# Patient Record
Sex: Female | Born: 1955 | Race: White | Hispanic: No | Marital: Married | Smoking: Never smoker
Health system: Southern US, Community
[De-identification: ages and names within clinical notes are randomized; demographics above are authoritative.]

## PROBLEM LIST (undated history)

## (undated) DIAGNOSIS — M869 Osteomyelitis, unspecified: Secondary | ICD-10-CM

## (undated) DIAGNOSIS — M549 Dorsalgia, unspecified: Secondary | ICD-10-CM

## (undated) DIAGNOSIS — M542 Cervicalgia: Secondary | ICD-10-CM

## (undated) DIAGNOSIS — Z7689 Persons encountering health services in other specified circumstances: Secondary | ICD-10-CM

## (undated) DIAGNOSIS — N200 Calculus of kidney: Secondary | ICD-10-CM

## (undated) DIAGNOSIS — M4646 Discitis, unspecified, lumbar region: Secondary | ICD-10-CM

## (undated) DIAGNOSIS — K219 Gastro-esophageal reflux disease without esophagitis: Secondary | ICD-10-CM

## (undated) DIAGNOSIS — K759 Inflammatory liver disease, unspecified: Secondary | ICD-10-CM

## (undated) DIAGNOSIS — F419 Anxiety disorder, unspecified: Secondary | ICD-10-CM

## (undated) DIAGNOSIS — J45990 Exercise induced bronchospasm: Secondary | ICD-10-CM

## (undated) DIAGNOSIS — I10 Essential (primary) hypertension: Secondary | ICD-10-CM

## (undated) HISTORY — PX: PICC LINE PLACE PERIPHERAL (ARMC HX): HXRAD1248

## (undated) HISTORY — DX: Cervicalgia: M54.2

## (undated) HISTORY — DX: Anxiety disorder, unspecified: F41.9

---
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
## 1997-05-26 HISTORY — PX: TUBAL LIGATION: SHX77

## 1999-02-17 ENCOUNTER — Encounter: Payer: Self-pay | Admitting: Emergency Medicine

## 1999-02-17 ENCOUNTER — Emergency Department (HOSPITAL_COMMUNITY): Admission: EM | Admit: 1999-02-17 | Discharge: 1999-02-17 | Payer: Self-pay | Admitting: Emergency Medicine

## 2001-02-02 ENCOUNTER — Other Ambulatory Visit: Admission: RE | Admit: 2001-02-02 | Discharge: 2001-02-02 | Payer: Self-pay | Admitting: Obstetrics & Gynecology

## 2002-03-17 ENCOUNTER — Other Ambulatory Visit: Admission: RE | Admit: 2002-03-17 | Discharge: 2002-03-17 | Payer: Self-pay | Admitting: Obstetrics & Gynecology

## 2002-03-25 ENCOUNTER — Encounter: Payer: Self-pay | Admitting: Obstetrics & Gynecology

## 2002-03-25 ENCOUNTER — Encounter: Admission: RE | Admit: 2002-03-25 | Discharge: 2002-03-25 | Payer: Self-pay | Admitting: Obstetrics & Gynecology

## 2003-04-04 ENCOUNTER — Other Ambulatory Visit: Admission: RE | Admit: 2003-04-04 | Discharge: 2003-04-04 | Payer: Self-pay | Admitting: Obstetrics & Gynecology

## 2003-04-14 ENCOUNTER — Ambulatory Visit (HOSPITAL_COMMUNITY): Admission: RE | Admit: 2003-04-14 | Discharge: 2003-04-14 | Payer: Self-pay | Admitting: Internal Medicine

## 2003-05-08 ENCOUNTER — Encounter: Admission: RE | Admit: 2003-05-08 | Discharge: 2003-05-08 | Payer: Self-pay | Admitting: Obstetrics & Gynecology

## 2004-04-26 ENCOUNTER — Encounter: Admission: RE | Admit: 2004-04-26 | Discharge: 2004-04-26 | Payer: Self-pay | Admitting: Obstetrics & Gynecology

## 2004-04-26 ENCOUNTER — Other Ambulatory Visit: Admission: RE | Admit: 2004-04-26 | Discharge: 2004-04-26 | Payer: Self-pay | Admitting: Obstetrics & Gynecology

## 2005-05-29 ENCOUNTER — Encounter: Admission: RE | Admit: 2005-05-29 | Discharge: 2005-05-29 | Payer: Self-pay | Admitting: Obstetrics & Gynecology

## 2005-08-06 ENCOUNTER — Other Ambulatory Visit: Admission: RE | Admit: 2005-08-06 | Discharge: 2005-08-06 | Payer: Self-pay | Admitting: Obstetrics & Gynecology

## 2006-06-26 ENCOUNTER — Encounter: Admission: RE | Admit: 2006-06-26 | Discharge: 2006-06-26 | Payer: Self-pay | Admitting: Obstetrics & Gynecology

## 2006-09-04 ENCOUNTER — Encounter: Admission: RE | Admit: 2006-09-04 | Discharge: 2006-09-04 | Payer: Self-pay | Admitting: Obstetrics & Gynecology

## 2007-04-14 ENCOUNTER — Ambulatory Visit (HOSPITAL_COMMUNITY): Admission: RE | Admit: 2007-04-14 | Discharge: 2007-04-14 | Payer: Self-pay | Admitting: Internal Medicine

## 2007-05-17 ENCOUNTER — Encounter: Admission: RE | Admit: 2007-05-17 | Discharge: 2007-05-17 | Payer: Self-pay | Admitting: General Surgery

## 2008-05-26 HISTORY — PX: BREAST CYST EXCISION: SHX579

## 2008-07-13 ENCOUNTER — Encounter: Admission: RE | Admit: 2008-07-13 | Discharge: 2008-07-13 | Payer: Self-pay | Admitting: Surgery

## 2008-07-18 ENCOUNTER — Ambulatory Visit (HOSPITAL_COMMUNITY): Admission: RE | Admit: 2008-07-18 | Discharge: 2008-07-18 | Payer: Self-pay | Admitting: Surgery

## 2008-07-18 ENCOUNTER — Encounter (INDEPENDENT_AMBULATORY_CARE_PROVIDER_SITE_OTHER): Payer: Self-pay | Admitting: Surgery

## 2009-10-18 ENCOUNTER — Encounter: Admission: RE | Admit: 2009-10-18 | Discharge: 2009-10-18 | Payer: Self-pay | Admitting: Obstetrics & Gynecology

## 2010-09-10 LAB — BASIC METABOLIC PANEL
BUN: 7 mg/dL (ref 6–23)
CO2: 27 mEq/L (ref 19–32)
Calcium: 9.2 mg/dL (ref 8.4–10.5)
Chloride: 100 mEq/L (ref 96–112)
Creatinine, Ser: 0.69 mg/dL (ref 0.4–1.2)
GFR calc Af Amer: >60 mL/min (ref >60)
GFR calc non Af Amer: >60 mL/min (ref >60)
Glucose, Bld: 100 mg/dL — ABNORMAL HIGH (ref 70–99)
Potassium: 3.5 mEq/L (ref 3.5–5.1)
Sodium: 136 mEq/L (ref 135–145)

## 2010-09-10 LAB — HEMOGLOBIN AND HEMATOCRIT, BLOOD
HCT: 37.2 % (ref 36.0–46.0)
Hemoglobin: 12.3 g/dL (ref 12.0–15.0)

## 2010-10-08 NOTE — Op Note (Signed)
NAME:  Chloe Johnson, Chloe Johnson                  ACCOUNT NO.:  [id number]   MEDICAL RECORD NO.:  [id number]          PATIENT TYPE:  AMB   LOCATION:  DAY                          FACILITY:  [facility]   PHYSICIAN:  Currie Paris, M.D.DATE OF BIRTH:  09/26/1955   DATE OF PROCEDURE:  07/18/2008  DATE OF DISCHARGE:                               OPERATIVE REPORT   OFFICE MEDICAL RECORD NUMBER [id number]   PREOPERATIVE DIAGNOSES:  Spontaneous nipple discharge, left breast.   POSTOPERATIVE DIAGNOSES:  Spontaneous nipple discharge, left breast.   OPERATION:  Ductal excision, left breast.   SURGEON:  Dr. Cyndia Bent   ANESTHESIA:  General.   CLINICAL HISTORY:  This is a 55 year old lady with a spontaneous clear  nipple discharge from a duct at about the 5 o'clock position on the left  breast.  A ductogram had shown a filling defect just under the skin.  Because of the persistent nature we elected to do a ductal excision.   DESCRIPTION OF PROCEDURE:  The patient was seen in the holding area and  she had no further questions.  We identified the left breast as the  operative side and I initialed that.   The patient was taken to the operating room.  After satisfactory general  anesthesia had been obtained, the breast was prepped and draped.  The  time-out was done.  I tried to cannulate the duct first with a 2-0 tear-  duct probe and was able to get the duct dilated a little bit.  I then  tried to inject some methylene blue, but the catheter only went a little  bit in and we did inject a little dye, but also got some staining of the  subcutaneous tissues.   I then made a curvilinear incision at the edge of the areola and  elevated the skin off of the undersurface of the areola and beyond the  nipple so that we had that tissue disconnected.  I could see the blue  tissue staining there.  I then took a central ductal excision, going  about a centimeter and a half deep and into some very dense breast  tissue.  It was very vascular and I cauterized several bleeders in doing  so.   I sent the specimen for pathology.  I irrigated.  I put 0.25% plain  Marcaine in to help with postop analgesia.  Once everything was dry and  I had irrigated several times, I went ahead closed with 3-0 Vicryl, 4-0  Monocryl subcuticular plus Dermabond.   The patient tolerated the procedure well and there were no  complications.  All counts were correct.      Currie Paris, M.D.  Electronically Signed     CJS/MEDQ  D:  07/18/2008  T:  07/19/2008  Job:  [id number]   cc:   Freddy Finner, M.D.  Fax: [phone]

## 2010-11-29 ENCOUNTER — Other Ambulatory Visit: Payer: Self-pay | Admitting: Obstetrics & Gynecology

## 2010-11-29 DIAGNOSIS — Z1231 Encounter for screening mammogram for malignant neoplasm of breast: Secondary | ICD-10-CM

## 2010-12-05 ENCOUNTER — Ambulatory Visit
Admission: RE | Admit: 2010-12-05 | Discharge: 2010-12-05 | Disposition: A | Discharge status: Home / Self Care | Payer: 59 | Source: Ambulatory Visit | Attending: Obstetrics & Gynecology | Admitting: Obstetrics & Gynecology

## 2010-12-05 DIAGNOSIS — Z1231 Encounter for screening mammogram for malignant neoplasm of breast: Secondary | ICD-10-CM

## 2012-05-13 ENCOUNTER — Other Ambulatory Visit: Payer: Self-pay | Admitting: Obstetrics & Gynecology

## 2012-05-13 DIAGNOSIS — Z1231 Encounter for screening mammogram for malignant neoplasm of breast: Secondary | ICD-10-CM

## 2012-05-21 ENCOUNTER — Other Ambulatory Visit: Payer: Self-pay | Admitting: Internal Medicine

## 2012-05-21 DIAGNOSIS — G8929 Other chronic pain: Secondary | ICD-10-CM

## 2012-05-21 DIAGNOSIS — M5410 Radiculopathy, site unspecified: Secondary | ICD-10-CM

## 2012-05-27 ENCOUNTER — Ambulatory Visit
Admission: RE | Admit: 2012-05-27 | Discharge: 2012-05-27 | Disposition: A | Discharge status: Home / Self Care | Payer: BC Managed Care – PPO | Source: Ambulatory Visit | Attending: Internal Medicine | Admitting: Internal Medicine

## 2012-05-27 DIAGNOSIS — M5410 Radiculopathy, site unspecified: Secondary | ICD-10-CM

## 2012-05-27 DIAGNOSIS — G8929 Other chronic pain: Secondary | ICD-10-CM

## 2012-05-27 DIAGNOSIS — M542 Cervicalgia: Secondary | ICD-10-CM

## 2012-06-02 ENCOUNTER — Ambulatory Visit
Admission: RE | Admit: 2012-06-02 | Discharge: 2012-06-02 | Disposition: A | Discharge status: Home / Self Care | Payer: BC Managed Care – PPO | Source: Ambulatory Visit | Attending: Obstetrics & Gynecology | Admitting: Obstetrics & Gynecology

## 2012-06-02 DIAGNOSIS — Z1231 Encounter for screening mammogram for malignant neoplasm of breast: Secondary | ICD-10-CM

## 2013-06-15 ENCOUNTER — Other Ambulatory Visit: Payer: Self-pay

## 2013-06-15 DIAGNOSIS — Z1231 Encounter for screening mammogram for malignant neoplasm of breast: Secondary | ICD-10-CM

## 2013-06-23 ENCOUNTER — Ambulatory Visit
Admission: RE | Admit: 2013-06-23 | Discharge: 2013-06-23 | Disposition: A | Discharge status: Home / Self Care | Payer: Self-pay | Source: Ambulatory Visit

## 2013-06-23 DIAGNOSIS — Z1231 Encounter for screening mammogram for malignant neoplasm of breast: Secondary | ICD-10-CM

## 2013-09-11 ENCOUNTER — Emergency Department (HOSPITAL_COMMUNITY)
Admission: EM | Admit: 2013-09-11 | Discharge: 2013-09-11 | Disposition: A | Discharge status: Home / Self Care | Payer: Managed Care, Other (non HMO) | Source: Home / Self Care | Attending: Emergency Medicine | Admitting: Emergency Medicine

## 2013-09-11 ENCOUNTER — Encounter (HOSPITAL_COMMUNITY): Payer: Self-pay | Admitting: Emergency Medicine

## 2013-09-11 DIAGNOSIS — M5430 Sciatica, unspecified side: Secondary | ICD-10-CM

## 2013-09-11 HISTORY — DX: Essential (primary) hypertension: I10

## 2013-09-11 MED ORDER — METHYLPREDNISOLONE ACETATE 80 MG/ML IJ SUSP
INTRAMUSCULAR | Status: AC
  Filled 2013-09-11: qty 1

## 2013-09-11 MED ORDER — CYCLOBENZAPRINE HCL 5 MG PO TABS: 5.0000 mg | ORAL_TABLET | Freq: Three times a day (TID) | ORAL | Status: DC | PRN

## 2013-09-11 MED ORDER — PREDNISONE 20 MG PO TABS: ORAL_TABLET | ORAL | Status: DC

## 2013-09-11 MED ORDER — METHYLPREDNISOLONE ACETATE 80 MG/ML IJ SUSP
80.0000 mg | Freq: Once | INTRAMUSCULAR | Status: AC
  Administered 2013-09-11: 80 mg via INTRAMUSCULAR

## 2013-09-11 MED ORDER — KETOROLAC TROMETHAMINE 60 MG/2ML IM SOLN
60.0000 mg | Freq: Once | INTRAMUSCULAR | Status: AC
  Administered 2013-09-11: 60 mg via INTRAMUSCULAR

## 2013-09-11 MED ORDER — KETOROLAC TROMETHAMINE 60 MG/2ML IM SOLN
INTRAMUSCULAR | Status: AC
  Filled 2013-09-11: qty 2

## 2013-09-11 MED ORDER — OXYCODONE-ACETAMINOPHEN 5-325 MG PO TABS: ORAL_TABLET | ORAL | Status: DC

## 2013-09-11 NOTE — ED Notes (Signed)
Pain in lower back , mild stool incontinence , slight numbness in leg, since last PM. [employer] last PM

## 2013-09-11 NOTE — ED Provider Notes (Signed)
Chief Complaint   Chief Complaint  Patient presents with  . Back Pain    History of Present Illness   Zhana Bogle is a 58 year old female with a history of chronic back and neck pain. She had a flareup of the lower back pain last night at 7 PM. She denies any injury to the back. The pain is localized to the lower back and is rated 10 over 10 in intensity with muscle spasm. It's worse if she moves, and radiates down the left leg as far as the foot with numbness and weakness in the leg. She had one episode of incontinence of stool. She denies any episodes of incontinence of urine or saddle anesthesia. The patient states that she has had episodes of stool incontinence in the past. She does not want to go to the hospital tonight, and declines any further testing. She wants a Toradol shot and some prednisone.  Review of Systems   Other than as noted above, the patient denies any of the following symptoms: Systemic:  No fever, chills, or unexplained weight loss. GI:  No abdominal painor incontinence of bowel. GU:  No dysuria, frequency, urgency, or hematuria. No incontinence of urine or urinary retention.  M-S:  No neck pain or arthritis. Neuro:  No paresthesias, headache, saddle anesthesia, muscular weakness, or progressive neurological deficit.  PMFSH   Past medical history, family history, social history, meds, and allergies were reviewed. Specifically, there is no history of cancer, major trauma, osteoporosis, immunosuppression, or HIV infection.   Physical Examination    Vital signs:  BP 107/62  Pulse 72  Temp(Src) 98 F (36.7 C) (Oral)  Resp 16  SpO2 99% General:  Alert, oriented, in no distress. Abdomen:  Soft, non-tender.  No organomegaly or mass.  No pulsatile midline abdominal mass or bruit. Back:  Tender to palpation in the lower lumbar spine was virtually 0 of motion. Straight leg raising was negative. Neuro:  Normal muscle strength, sensations and DTRs. Extremities: Pedal  pulses were full, there was no edema. Skin:  Clear, warm and dry.  No rash.   Course in Urgent Care Center   Given Toradol 60 mg IM and Depo-Medrol 80 mg IM.    Assessment   The encounter diagnosis was Sciatica.  With the incontinence of stool, cauda equina syndrome is a possibility. I discussed this with her and told her that we recommend people with this symptom be sent to the hospital for either a CT or MRI scan. She steadfastly declines this course of treatment. She promises to followup with her primary care physician tomorrow. She will call back or return to the emergency room if any new neurological symptoms occur. She states she has had transient episodes of incontinence of stool in the past with this.  Plan     1.  Meds:  The following meds were prescribed:   Discharge Medication List as of 09/11/2013  4:35 PM    START taking these medications   Details  !! cyclobenzaprine (FLEXERIL) 5 MG tablet Take 1 tablet (5 mg total) by mouth 3 (three) times daily as needed for muscle spasms., Starting 09/11/2013, Until Discontinued, Normal    oxyCODONE-acetaminophen (PERCOCET) 5-325 MG per tablet 1 to 2 tablets every 6 hours as needed for pain., Print    predniSONE (DELTASONE) 20 MG tablet Take 3 daily for 5 days, 2 daily for 5 days, 1 daily for 5 days., Normal     !! - Potential duplicate medications found. Please discuss with  provider.      2.  Patient Education/Counseling:  The patient was given appropriate handouts, self care instructions, and instructed in symptomatic relief. The patient was encouraged to try to be as active as possible and given some exercises to do followed by moist heat.   3.  Follow up:  The patient was told to follow up here if no better in 3 to 4 days, or sooner if becoming worse in any way, and given some red flag symptoms such as worsening pain or new neurological symptoms which would prompt immediate return.  Follow up with her primary care physician  tomorrow.     Reuben Likesavid C Bauer Ausborn, MD 09/11/13 2116

## 2013-09-11 NOTE — Discharge Instructions (Signed)
Do exercises twice daily followed by moist heat for 15 minutes. ° ° ° ° ° °Try to be as active as possible. ° °If no better in 2 weeks, follow up with orthopedist. ° ° °

## 2013-09-12 ENCOUNTER — Other Ambulatory Visit: Payer: Self-pay | Admitting: Internal Medicine

## 2013-09-12 DIAGNOSIS — M5430 Sciatica, unspecified side: Secondary | ICD-10-CM

## 2013-09-15 ENCOUNTER — Ambulatory Visit
Admission: RE | Admit: 2013-09-15 | Discharge: 2013-09-15 | Disposition: A | Discharge status: Home / Self Care | Payer: Managed Care, Other (non HMO) | Source: Ambulatory Visit | Attending: Internal Medicine | Admitting: Internal Medicine

## 2013-09-15 DIAGNOSIS — M5430 Sciatica, unspecified side: Secondary | ICD-10-CM

## 2013-10-13 ENCOUNTER — Other Ambulatory Visit: Payer: Self-pay | Admitting: Physical Medicine and Rehabilitation

## 2013-10-13 DIAGNOSIS — M546 Pain in thoracic spine: Secondary | ICD-10-CM

## 2013-10-20 ENCOUNTER — Ambulatory Visit
Admission: RE | Admit: 2013-10-20 | Discharge: 2013-10-20 | Disposition: A | Discharge status: Home / Self Care | Payer: Managed Care, Other (non HMO) | Source: Ambulatory Visit | Attending: Physical Medicine and Rehabilitation | Admitting: Physical Medicine and Rehabilitation

## 2013-10-20 DIAGNOSIS — M546 Pain in thoracic spine: Secondary | ICD-10-CM

## 2013-10-26 ENCOUNTER — Encounter: Payer: Self-pay | Admitting: Neurology

## 2013-10-26 ENCOUNTER — Ambulatory Visit (INDEPENDENT_AMBULATORY_CARE_PROVIDER_SITE_OTHER): Payer: Managed Care, Other (non HMO) | Admitting: Neurology

## 2013-10-26 VITALS — BP 119/82 | HR 88 | Ht 61.0 in | Wt 165.0 lb

## 2013-10-26 DIAGNOSIS — F411 Generalized anxiety disorder: Secondary | ICD-10-CM

## 2013-10-26 DIAGNOSIS — I10 Essential (primary) hypertension: Secondary | ICD-10-CM

## 2013-10-26 DIAGNOSIS — J45909 Unspecified asthma, uncomplicated: Secondary | ICD-10-CM | POA: Insufficient documentation

## 2013-10-26 DIAGNOSIS — F419 Anxiety disorder, unspecified: Secondary | ICD-10-CM

## 2013-10-26 NOTE — Progress Notes (Signed)
PATIENT: Chloe Johnson DOB: 04/21/1956  HISTORICAL  Meklit Cotta is a 58 year old right-handed Caucasian female, referred by pain management Dr. Mina Marble, and a primary care physician Dr. Lavone Orn for evaluation of low back pain, muscle spasm.  April nineteenth, she woke up in the middle of the sleep, felt severe trunk muscle spasm, lasting for a few hours, taking away her breath, this is about a week after she played tennis, she reported being exercise regularly, but has not played tennis for a while,  Since the initial event in April 19, she has intermittent trunk muscle spasm, from upper back, and lumbar region, it can last few seconds, to a few hours, triggered by certain movement,  She denies bilateral upper lower extremity weakness, no sensory loss in between episode, now bowel and bladder incontinence,  Over the past 2 weeks, she has been on titrating dose of gabapentin 600 mg 4 times a day, Flexeril 5 mg 2 tablets 3 times a day, without helping her symptoms, on a recent couple days, she began to taking diclofenac, which has been very helpful, she also has tried tramadol, intermittent oxycodone, only temporary relief her symptoms,   She received bilateral L5-S1 lumbar spine transforaminal epidural steroid injection May 20 first 2015, and left L3-4, L5-S1 lumbar spinal transforaminal epidural steroid injection Sep 29 2013 without helping her symptoms,  We have reviewed cervical MRI at [facility] imaging, which showed showed mild canal stenosis, no cord compression, disc herniation at C5-6, C6-7 level, previously she had cervical epidural injection, which did help her pain,  MRI of the Lumbar at [facility] radiology September 15 2013, At L2-3 there is a mild broad-based disc bulge with flattening of the ventral thecal sac. Moderate left facet arthropathy with ligamentum flavum infolding resulting in left lateral recess stenosis.  At L3-4 there is a moderate broad-based disc bulge with a left far  lateral disc component in close proximity to the left extra foraminal L3 nerve root. Moderate left facet arthropathy. Severe left and moderate right lateral recess stenosis. Mild spinal stenosis. Mild left foraminal stenosis.  At L4-5 there is a moderate broad-based disc osteophyte complex with flattening of the ventral thecal sac. Bilateral mild facet arthropathy. Moderate lateral recess stenosis. Moderate right  foraminal stenosis. Advanced degenerative cervical spondylosis with multilevel disc disease and facet disease. Disc osteophyte complexes at multiple  levels with spinal and foraminal stenosis as discussed above. Findings are progressive when compared with the prior examination from 2008.   MRI of thoracic spine showed mild degenerative disc disease but no significant canal stenosis.  Today, she feels better, denied gait difficulty, denies significant muscle spasm  REVIEW OF SYSTEMS: Full 14 system review of systems performed and notable only for weight gain, easy bruising, easy bleeding, feeling hot, flushing, joint pain, cramps, achy muscles, skin sensitivity, anxiety, not enough sleep  ALLERGIES: Allergies  Allergen Reactions  . Erythromycin     N+V  . Penicillins     Hives  . Shellfish Allergy     GI- Facial  Acne  . Strawberry     Mouth ulcers   . Sulfa Antibiotics     Hives  . Vicodin [Hydrocodone-Acetaminophen] Other (See Comments)    restless    HOME MEDICATIONS: Current Outpatient Prescriptions on File Prior to Visit  Medication Sig Dispense Refill  . cyclobenzaprine (FLEXERIL) 10 MG tablet Take 10 mg by mouth 3 (three) times daily as needed for muscle spasms.      Marland Kitchen esomeprazole (NEXIUM) 40  MG capsule Take 40 mg by mouth daily at 12 noon.      . gabapentin (NEURONTIN) 600 MG tablet Take 600 mg by mouth 3 (three) times daily.      Marland Kitchen lisinopril-hydrochlorothiazide (PRINZIDE,ZESTORETIC) 10-12.5 MG per tablet Take 1 tablet by mouth daily.      Marland Kitchen  oxyCODONE-acetaminophen (PERCOCET) 5-325 MG per tablet 1 to 2 tablets every 6 hours as needed for pain.  20 tablet  0  . potassium chloride SA (K-DUR,KLOR-CON) 20 MEQ tablet Take 20 mEq by mouth 2 (two) times daily.      . predniSONE (DELTASONE) 20 MG tablet Take 3 daily for 5 days, 2 daily for 5 days, 1 daily for 5 days.  30 tablet  0     PAST MEDICAL HISTORY: Past Medical History  Diagnosis Date  . Hypertension   . Asthma   . Neck pain   . Anxiety     PAST SURGICAL HISTORY: Past Surgical History  Procedure Laterality Date  . Tonsillectomy    . Tubal ligation      FAMILY HISTORY: No family history on file.  SOCIAL HISTORY:  History   Social History  . Marital Status: Married    Spouse Name: N/A    Number of Children: 3  . Years of Education: N/A   Occupational History  . 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   Social History Main Topics  . Smoking status: Never Smoker   . Smokeless tobacco: Not on file  . Alcohol Use: No  . Drug Use: Not on file  . Sexual Activity: Not on file   Other Topics Concern  . Not on file   Social History Narrative  . No narrative on file    PHYSICAL EXAM   Filed Vitals:   10/26/13 1126  BP: 119/82  Pulse: 88  Height: $Remove'5\' 1"'jlZDxUL$  (1.549 m)  Weight: 165 lb (74.844 kg)    Not recorded    Body mass index is 31.19 kg/(m^2).   Generalized: In no acute distress  Neck: Supple, no carotid bruits   Cardiac: Regular rate rhythm  Pulmonary: Clear to auscultation bilaterally  Musculoskeletal: No deformity, paraspinal muscle tightness  Neurological examination  Mentation: Alert oriented to time, place, history taking, and causual conversation  Cranial nerve II-XII: Pupils were equal round reactive to light. Extraocular movements were full.  Visual field were full on confrontational test. Bilateral fundi were sharp.  Facial sensation and strength were normal. Hearing was intact to finger rubbing bilaterally. Uvula  tongue midline.  Head turning and shoulder shrug and were normal and symmetric.Tongue protrusion into cheek strength was normal.  Motor: Normal tone, bulk and strength.  Sensory: Intact to fine touch, pinprick, preserved vibratory sensation, and proprioception at toes.  Coordination: Normal finger to nose, heel-to-shin bilaterally there was no truncal ataxia  Gait: Rising up from seated position without assistance, normal stance, without trunk ataxia, moderate stride, good arm swing, smooth turning, able to perform tiptoe, and heel walking without difficulty.   Romberg signs: Negative  Deep tendon reflexes: Brachioradialis 2/2, biceps 2/2, triceps 2/2, patellar 2/2, Achilles 2/2, plantar responses were flexor bilaterally.   DIAGNOSTIC DATA (LABS, IMAGING, TESTING) - I reviewed patient records, labs, notes, testing and imaging myself where available.  Lab Results  Component Value Date   HGB 12.3 07/18/2008   HCT 37.2 07/18/2008      Component Value Date/Time   NA 136 07/18/2008 0830   K 3.5 07/18/2008 0830   CL 100  07/18/2008 0830   CO2 27 07/18/2008 0830   GLUCOSE 100* 07/18/2008 0830   BUN 7 07/18/2008 0830   CREATININE 0.69 07/18/2008 0830   CALCIUM 9.2 07/18/2008 0830   GFRNONAA >60 07/18/2008 0830   GFRAA  Value: >60        The eGFR has been calculated using the MDRD equation. This calculation has not been validated in all clinical situations. eGFR's persistently <60 mL/min signify possible Chronic Kidney Disease. 07/18/2008 0830    ASSESSMENT AND PLAN  Lorriane Dehart is a 58 y.o. female complains of  upper trunk muscle spasm, essentially normal neurological examination, responding well to diclofenac 75 mg twice a day Likely musculoskeletal in etiology, there was no evidence of cervical, lumbar radiculopathy, no evidence of myelopathy,  She is to continue taking diclofenac, return to clinic as needed,   Marcial Pacas, M.D. Ph.D.  [facility] [address], [place], [place] [address] [phone]

## 2013-11-16 ENCOUNTER — Inpatient Hospital Stay (HOSPITAL_COMMUNITY)
Admission: EM | Admit: 2013-11-16 | Discharge: 2013-11-18 | DRG: 520 | Disposition: A | Discharge status: Home / Self Care | Payer: Managed Care, Other (non HMO) | Attending: Internal Medicine | Admitting: Internal Medicine

## 2013-11-16 ENCOUNTER — Encounter (HOSPITAL_COMMUNITY): Payer: Self-pay | Admitting: Emergency Medicine

## 2013-11-16 DIAGNOSIS — J45909 Unspecified asthma, uncomplicated: Secondary | ICD-10-CM | POA: Diagnosis present

## 2013-11-16 DIAGNOSIS — I10 Essential (primary) hypertension: Secondary | ICD-10-CM | POA: Diagnosis present

## 2013-11-16 DIAGNOSIS — M4626 Osteomyelitis of vertebra, lumbar region: Secondary | ICD-10-CM | POA: Diagnosis present

## 2013-11-16 DIAGNOSIS — M869 Osteomyelitis, unspecified: Secondary | ICD-10-CM

## 2013-11-16 DIAGNOSIS — M549 Dorsalgia, unspecified: Principal | ICD-10-CM | POA: Diagnosis present

## 2013-11-16 DIAGNOSIS — Z880 Allergy status to penicillin: Secondary | ICD-10-CM

## 2013-11-16 DIAGNOSIS — M48061 Spinal stenosis, lumbar region without neurogenic claudication: Secondary | ICD-10-CM | POA: Diagnosis present

## 2013-11-16 DIAGNOSIS — L03116 Cellulitis of left lower limb: Secondary | ICD-10-CM

## 2013-11-16 DIAGNOSIS — M4646 Discitis, unspecified, lumbar region: Secondary | ICD-10-CM

## 2013-11-16 DIAGNOSIS — R197 Diarrhea, unspecified: Secondary | ICD-10-CM | POA: Diagnosis present

## 2013-11-16 DIAGNOSIS — F419 Anxiety disorder, unspecified: Secondary | ICD-10-CM

## 2013-11-16 DIAGNOSIS — L03115 Cellulitis of right lower limb: Secondary | ICD-10-CM | POA: Diagnosis present

## 2013-11-16 DIAGNOSIS — Z79899 Other long term (current) drug therapy: Secondary | ICD-10-CM

## 2013-11-16 DIAGNOSIS — F411 Generalized anxiety disorder: Secondary | ICD-10-CM | POA: Diagnosis present

## 2013-11-16 DIAGNOSIS — Z8249 Family history of ischemic heart disease and other diseases of the circulatory system: Secondary | ICD-10-CM

## 2013-11-16 HISTORY — DX: Discitis, unspecified, lumbar region: M46.46

## 2013-11-16 LAB — URINALYSIS, ROUTINE W REFLEX MICROSCOPIC
BILIRUBIN URINE: NEGATIVE
GLUCOSE, UA: NEGATIVE mg/dL
Hgb urine dipstick: NEGATIVE
KETONES UR: NEGATIVE mg/dL
Leukocytes, UA: NEGATIVE
NITRITE: NEGATIVE
Protein, ur: NEGATIVE mg/dL
Specific Gravity, Urine: 1.011 (ref 1.005–1.030)
Urobilinogen, UA: 0.2 mg/dL (ref 0.0–1.0)
pH: 7.5 (ref 5.0–8.0)

## 2013-11-16 LAB — CBC WITH DIFFERENTIAL/PLATELET
BASOS ABS: 0 10*3/uL (ref 0.0–0.1)
Basophils Relative: 0 % (ref 0–1)
EOS ABS: 0 10*3/uL (ref 0.0–0.7)
EOS PCT: 0 % (ref 0–5)
HEMATOCRIT: 35.4 % — AB (ref 36.0–46.0)
Hemoglobin: 11.3 g/dL — ABNORMAL LOW (ref 12.0–15.0)
Lymphocytes Relative: 13 % (ref 12–46)
Lymphs Abs: 1.2 10*3/uL (ref 0.7–4.0)
MCH: 27.4 pg (ref 26.0–34.0)
MCHC: 31.9 g/dL (ref 30.0–36.0)
MCV: 85.7 fL (ref 78.0–100.0)
MONO ABS: 0.2 10*3/uL (ref 0.1–1.0)
Monocytes Relative: 2 % — ABNORMAL LOW (ref 3–12)
Neutro Abs: 7.9 10*3/uL — ABNORMAL HIGH (ref 1.7–7.7)
Neutrophils Relative %: 85 % — ABNORMAL HIGH (ref 43–77)
Platelets: 289 10*3/uL (ref 150–400)
RBC: 4.13 MIL/uL (ref 3.87–5.11)
RDW: 13.5 % (ref 11.5–15.5)
WBC: 9.4 10*3/uL (ref 4.0–10.5)

## 2013-11-16 LAB — COMPREHENSIVE METABOLIC PANEL
ALT: 28 U/L (ref 0–35)
AST: 18 U/L (ref 0–37)
Albumin: 3.6 g/dL (ref 3.5–5.2)
Alkaline Phosphatase: 108 U/L (ref 39–117)
BUN: 15 mg/dL (ref 6–23)
CALCIUM: 9.9 mg/dL (ref 8.4–10.5)
CO2: 25 meq/L (ref 19–32)
Chloride: 100 mEq/L (ref 96–112)
Creatinine, Ser: 0.72 mg/dL (ref 0.50–1.10)
GFR calc Af Amer: >90 mL/min (ref >90)
Glucose, Bld: 146 mg/dL — ABNORMAL HIGH (ref 70–99)
Potassium: 3.9 mEq/L (ref 3.7–5.3)
Sodium: 141 mEq/L (ref 137–147)
TOTAL PROTEIN: 7.3 g/dL (ref 6.0–8.3)
Total Bilirubin: <0.2 mg/dL — ABNORMAL LOW (ref 0.3–1.2)

## 2013-11-16 MED ORDER — DIAZEPAM 5 MG PO TABS
10.0000 mg | ORAL_TABLET | Freq: Once | ORAL | Status: AC
  Administered 2013-11-16: 10 mg via ORAL
  Filled 2013-11-16: qty 2

## 2013-11-16 MED ORDER — OXYCODONE-ACETAMINOPHEN 5-325 MG PO TABS
2.0000 | ORAL_TABLET | Freq: Once | ORAL | Status: AC
  Administered 2013-11-16: 2 via ORAL
  Filled 2013-11-16: qty 2

## 2013-11-16 NOTE — ED Notes (Signed)
Call Carlyle Lipa on 5N at [phone] for report

## 2013-11-16 NOTE — ED Notes (Signed)
Pt reports having hx of lower back pain. Has been diagnosed with osteomyelitis of lumbar spine. Pt reports now being weak, frequent falls and numbness to left foot. Was told by dr Yetta Barre to come to ED for possible admission. Denies incontinence.

## 2013-11-16 NOTE — H&P (Signed)
[facility] History and Physical  Patient: Chloe Johnson  XNA:[id number]  DOB: 03-24-1956  DOS: the patient was seen and examined on 11/16/2013 PCP: Irven Shelling, MD  Chief Complaint: Back pain  HPI: Karaline Buresh is a 58 y.o. female with Past medical history of hypertension, neck pain, back pain, asthma. Patient presented with complaints of back pain and an abnormal MRI. She mentions that she woke up with a severe back pain in April and has been following with multiple providers for the same. She has history of neck pain and was on steroids on and off in the past for the same. She has seen neurology as an outpatient and then was followed by orthopedic spine specialist who referred her to neurosurgery Dr. Ronnald Ramp, who obtain an MRI yesterday which was showing possible L3-L4 discitis versus osteomyelitis and possible paraspinal fluid collection. She mentions her back pain was worsening today. She complains of some tingling and numbness in her left lower extremity foot area but no other focal neurological deficit. She mentions that has been progressively worsening. She denies any fever or chills. 2 days ago she was started on prednisone by her neurosurgery, and she mentions prednisone causes her to have diarrhea. She also mentions that she has noted bilateral knee redness today which is new.  The patient is coming from home. And at her baseline independent for most of her ADL.  Review of Systems: as mentioned in the history of present illness.  A Comprehensive review of the other systems is negative.  Past Medical History  Diagnosis Date  . Hypertension   . Asthma   . Neck pain   . Anxiety    Past Surgical History  Procedure Laterality Date  . Tonsillectomy    . Tubal ligation    . Btl     Social History:  reports that she has never smoked. She has never used smokeless tobacco. She reports that she does not drink alcohol or use illicit drugs.  Allergies  Allergen Reactions  .  Erythromycin     N+V  . Latex     Irritation of the skin  . Penicillins     Hives  . Shellfish Allergy     GI- Facial  Acne  . Strawberry     Mouth ulcers   . Sulfa Antibiotics     Hives  . Vicodin [Hydrocodone-Acetaminophen] Other (See Comments)    restless    History reviewed. No pertinent family history.  Prior to Admission medications   Medication Sig Start Date End Date Taking? Authorizing Provider  cyclobenzaprine (FLEXERIL) 5 MG tablet Take 10 mg by mouth 3 (three) times daily as needed for muscle spasms.   Yes Historical Provider, MD  diazepam (VALIUM) 10 MG tablet Take 10 mg by mouth daily as needed. For spasms 11/10/13  Yes Historical Provider, MD  diclofenac (VOLTAREN) 75 MG EC tablet Take 75 mg by mouth 2 (two) times daily.  10/25/13  Yes Historical Provider, MD  esomeprazole (NEXIUM) 40 MG capsule Take 40 mg by mouth daily at 12 noon.   Yes Historical Provider, MD  EVENING PRIMROSE OIL PO Take 1,000 mg by mouth 2 (two) times daily.   Yes Historical Provider, MD  FLUoxetine (PROZAC) 20 MG capsule Take 20 mg by mouth daily.  10/08/13  Yes Historical Provider, MD  gabapentin (NEURONTIN) 600 MG tablet Take 600 mg by mouth 4 (four) times daily.    Yes Historical Provider, MD  lisinopril-hydrochlorothiazide (PRINZIDE,ZESTORETIC) 10-12.5 MG per tablet Take 1  tablet by mouth daily.   Yes Historical Provider, MD  loratadine (CLARITIN) 10 MG tablet Take 10 mg by mouth daily as needed for allergies.   Yes Historical Provider, MD  Omega-3 Fatty Acids (FISH OIL) 1200 MG CAPS Take 1,200 mg by mouth 2 (two) times daily.   Yes Historical Provider, MD  OVER THE COUNTER MEDICATION Take 1 tablet by mouth 2 (two) times daily. "Estrovera"   Yes Historical Provider, MD  OVER THE COUNTER MEDICATION Take 1-3 tablets by mouth daily as needed (for constipation). "Super Cleanse"   Yes Historical Provider, MD  oxyCODONE-acetaminophen (PERCOCET) 5-325 MG per tablet 1 to 2 tablets every 6 hours as needed  for pain. 09/11/13  Yes Harden Mo, MD  potassium chloride SA (K-DUR,KLOR-CON) 20 MEQ tablet Take 20 mEq by mouth 2 (two) times daily.   Yes Historical Provider, MD  predniSONE (DELTASONE) 10 MG tablet Take 10-60 mg by mouth See admin instructions. Take 60 mg by mouth daily for 2 days, 50 mg daily for 2 days, 40 mg daily for 2 days, 30 mg daily for 2 days, 20 mg daily for 2 days, and 10 mg daily for 2 days.   Yes Historical Provider, MD  triamcinolone (NASACORT ALLERGY 24HR) 55 MCG/ACT AERO nasal inhaler Place 1 spray into both nostrils daily as needed (for allergies).   Yes Historical Provider, MD    Physical Exam: Filed Vitals:   11/16/13 2130 11/16/13 2145 11/16/13 2215 11/16/13 2245  BP: 134/80 127/83 138/87 144/74  Pulse:  80 77 81  Temp:      TempSrc:      Resp:  _0 Height:      Weight:      SpO2:  90% 100% 99%    General: Alert, Awake and Oriented to Time, Place and Person. Appear in mild distress Eyes: PERRL ENT: Oral Mucosa clear moisty. Neck: no JVD Cardiovascular: S1 and S2 Present, no Murmur, Peripheral Pulses Present Respiratory: Bilateral Air entry equal and Decreased, Clear to Auscultation,  no Crackles,no wheezes Abdomen: Bowel Sound Present, Soft and Non tender Skin: no Rash Extremities: no Pedal edema, no calf tenderness Range of motion within normal limits. No paraspinal tenderness. Bilateral knee redness Neurologic: Grossly no focal neuro deficit other than minimally decreased sensation on the dorsum and plantar surface of left foot.  Labs on Admission:  CBC:  Recent Labs Lab 11/16/13 2136  WBC 9.4  NEUTROABS 7.9*  HGB 11.3*  HCT 35.4*  MCV 85.7  PLT 289    CMP     Component Value Date/Time   NA 141 11/16/2013 2136   K 3.9 11/16/2013 2136   CL 100 11/16/2013 2136   CO2 25 11/16/2013 2136   GLUCOSE 146* 11/16/2013 2136   BUN 15 11/16/2013 2136   CREATININE 0.72 11/16/2013 2136   CALCIUM 9.9 11/16/2013 2136   PROT 7.3 11/16/2013 2136    ALBUMIN 3.6 11/16/2013 2136   AST 18 11/16/2013 2136   ALT 28 11/16/2013 2136   ALKPHOS 108 11/16/2013 2136   BILITOT <0.2* 11/16/2013 2136   GFRNONAA >90 11/16/2013 2136   GFRAA >90 11/16/2013 2136    No results found for this basename: LIPASE, AMYLASE,  in the last 168 hours No results found for this basename: AMMONIA,  in the last 168 hours  No results found for this basename: CKTOTAL, CKMB, CKMBINDEX, TROPONINI,  in the last 168 hours BNP (last 3 results) No results found for this basename: PROBNP,  in  the last 8760 hours  Radiological Exams on Admission: No results found.  Assessment/Plan Principal Problem:   Osteomyelitis of lumbar spine Active Problems:   Hypertension   Anxiety   Diarrhea   Possible Bilateral cellulitis of lower leg   1. Osteomyelitis of lumbar spine Patient presents with complaints of back pain and was found to have possible discitis versus osteomyelitis of L3-L4 area. Neurosurgery has been consulted by the emergency room who recommends holding off on antibiotics at present. Neurosurgery recommends interventional radiology guided aspiration of the spinal fluid. At present I would check ESR and CRP. Keep the patient n.p.o. after midnight. Monitor serial neuro checks. Interventional radiology will be consulted who will follow the patient in the morning.  2. diarrhea At present holding patient's prednisone is the presence of infection and check C. difficile  Gentle IV hydration   3. possible cellulitis of knee At present continue to monitor. Once INR drainage is performed patient to be started on broad-spectrum antibiotics.  4.Hypertension Continue lisinopril holding hydrochlorothiazide.  Consults:  neurosurgery intervention radiology   DVT Prophylaxis: subcutaneous Heparin Nutrition:  regular diet n.p.o. after midnight  Code Status:  full   Family Communication:  family  was present at bedside, opportunity was given to ask question and all questions  were answered satisfactorily at the time of interview. Disposition: Admitted to inpatient in med-surge unit.  Author: Berle Mull, MD [facility] Pager: [phone] 11/16/2013, 11:11 PM    If 7PM-7AM, please contact night-coverage [email or web address] Password [email or web address]  **Disclaimer: This note may have been dictated with voice recognition software. Similar sounding words can inadvertently be transcribed and this note may contain transcription errors which may not have been corrected upon publication of note.**

## 2013-11-16 NOTE — ED Notes (Signed)
MD at bedside. 

## 2013-11-16 NOTE — ED Provider Notes (Signed)
CSN: [id number]     Arrival date & time 11/16/13  1802 History   First MD Initiated Contact with Patient 11/16/13 2048     Chief Complaint  Patient presents with  . Back Pain     (Consider location/radiation/quality/duration/timing/severity/associated sxs/prior Treatment) Patient is a 58 y.o. female presenting with back pain. The history is provided by the patient.  Back Pain Location:  Lumbar spine Quality:  Aching Radiates to:  Does not radiate Pain severity:  Moderate Pain is:  Same all the time Onset quality:  Sudden Duration:  2 months Timing:  Constant Progression:  Waxing and waning Chronicity:  New Context comment:  Initially occured in April suddenly at night Relieved by:  Bed rest, muscle relaxants and narcotics Worsened by:  Ambulation Ineffective treatments:  None tried Associated symptoms: no abdominal pain, no chest pain, no dysuria, no fever and no headaches   Associated symptoms comment:  Imbalance issues and numbness of left foot    Past Medical History  Diagnosis Date  . Hypertension   . Asthma   . Neck pain   . Anxiety    Past Surgical History  Procedure Laterality Date  . Tonsillectomy    . Tubal ligation    . Btl     History reviewed. No pertinent family history. History  Substance Use Topics  . Smoking status: Never Smoker   . Smokeless tobacco: Never Used  . Alcohol Use: No   OB History   Grav Para Term Preterm Abortions TAB SAB Ect Mult Living                 Review of Systems  Constitutional: Negative for fever and fatigue.  HENT: Negative for congestion and drooling.   Eyes: Negative for pain.  Respiratory: Negative for cough and shortness of breath.   Cardiovascular: Negative for chest pain.  Gastrointestinal: Negative for nausea, vomiting, abdominal pain and diarrhea.  Genitourinary: Negative for dysuria and hematuria.  Musculoskeletal: Positive for back pain. Negative for gait problem and neck pain.  Skin: Negative for  color change.  Neurological: Negative for dizziness and headaches.       Numbness of left foot Imbalance issues  Hematological: Negative for adenopathy.  Psychiatric/Behavioral: Negative for behavioral problems.  All other systems reviewed and are negative.     Allergies  Erythromycin; Latex; Penicillins; Shellfish allergy; Strawberry; Sulfa antibiotics; and Vicodin  Home Medications   Prior to Admission medications   Medication Sig Start Date End Date Taking? Authorizing Provider  cyclobenzaprine (FLEXERIL) 5 MG tablet Take 10 mg by mouth 3 (three) times daily as needed for muscle spasms.   Yes Historical Provider, MD  diazepam (VALIUM) 10 MG tablet Take 10 mg by mouth daily as needed. For spasms 11/10/13  Yes Historical Provider, MD  diclofenac (VOLTAREN) 75 MG EC tablet Take 75 mg by mouth 2 (two) times daily.  10/25/13  Yes Historical Provider, MD  esomeprazole (NEXIUM) 40 MG capsule Take 40 mg by mouth daily at 12 noon.   Yes Historical Provider, MD  EVENING PRIMROSE OIL PO Take 1,000 mg by mouth 2 (two) times daily.   Yes Historical Provider, MD  FLUoxetine (PROZAC) 20 MG capsule Take 20 mg by mouth daily.  10/08/13  Yes Historical Provider, MD  gabapentin (NEURONTIN) 600 MG tablet Take 600 mg by mouth 4 (four) times daily.    Yes Historical Provider, MD  lisinopril-hydrochlorothiazide (PRINZIDE,ZESTORETIC) 10-12.5 MG per tablet Take 1 tablet by mouth daily.   Yes Historical Provider,  MD  loratadine (CLARITIN) 10 MG tablet Take 10 mg by mouth daily as needed for allergies.   Yes Historical Provider, MD  Omega-3 Fatty Acids (FISH OIL) 1200 MG CAPS Take 1,200 mg by mouth 2 (two) times daily.   Yes Historical Provider, MD  OVER THE COUNTER MEDICATION Take 1 tablet by mouth 2 (two) times daily. "Estrovera"   Yes Historical Provider, MD  OVER THE COUNTER MEDICATION Take 1-3 tablets by mouth daily as needed (for constipation). "Super Cleanse"   Yes Historical Provider, MD   oxyCODONE-acetaminophen (PERCOCET) 5-325 MG per tablet 1 to 2 tablets every 6 hours as needed for pain. 09/11/13  Yes Reuben Likes, MD  potassium chloride SA (K-DUR,KLOR-CON) 20 MEQ tablet Take 20 mEq by mouth 2 (two) times daily.   Yes Historical Provider, MD  predniSONE (DELTASONE) 10 MG tablet Take 10-60 mg by mouth See admin instructions. Take 60 mg by mouth daily for 2 days, 50 mg daily for 2 days, 40 mg daily for 2 days, 30 mg daily for 2 days, 20 mg daily for 2 days, and 10 mg daily for 2 days.   Yes Historical Provider, MD  triamcinolone (NASACORT ALLERGY 24HR) 55 MCG/ACT AERO nasal inhaler Place 1 spray into both nostrils daily as needed (for allergies).   Yes Historical Provider, MD   BP 124/79  Pulse 92  Temp(Src) 97.6 F (36.4 C) (Oral)  Resp 18  Ht 5\' 1"  (1.549 m)  Wt 164 lb (74.39 kg)  BMI 31.00 kg/m2  SpO2 98% Physical Exam  Nursing note and vitals reviewed. Constitutional: She is oriented to person, place, and time. She appears well-developed and well-nourished.  HENT:  Head: Normocephalic.  Mouth/Throat: No oropharyngeal exudate.  Eyes: Conjunctivae and EOM are normal. Pupils are equal, round, and reactive to light.  Neck: Normal range of motion. Neck supple.  Cardiovascular: Normal rate, regular rhythm, normal heart sounds and intact distal pulses.  Exam reveals no gallop and no friction rub.   No murmur heard. Pulmonary/Chest: Effort normal and breath sounds normal. No respiratory distress. She has no wheezes.  Abdominal: Soft. Bowel sounds are normal. There is no tenderness. There is no rebound and no guarding.  Musculoskeletal: Normal range of motion. She exhibits no edema and no tenderness.  Neurological: She is alert and oriented to person, place, and time.  Reflex Scores:      Patellar reflexes are 2+ on the right side and 2+ on the left side.      Achilles reflexes are 2+ on the right side and 2+ on the left side. Normal sensation and strength in the  bilateral lower extremities.    Skin: Skin is warm and dry.  Psychiatric: She has a normal mood and affect. Her behavior is normal.    ED Course  Procedures (including critical care time) Labs Review Labs Reviewed  CBC WITH DIFFERENTIAL - Abnormal; Notable for the following:    Hemoglobin 11.3 (*)    HCT 35.4 (*)    Neutrophils Relative % 85 (*)    Neutro Abs 7.9 (*)    Monocytes Relative 2 (*)    All other components within normal limits  COMPREHENSIVE METABOLIC PANEL - Abnormal; Notable for the following:    Glucose, Bld 146 (*)    Total Bilirubin <0.2 (*)    All other components within normal limits  SEDIMENTATION RATE - Abnormal; Notable for the following:    Sed Rate 40 (*)    All other components within normal  limits  COMPREHENSIVE METABOLIC PANEL - Abnormal; Notable for the following:    Glucose, Bld 121 (*)    Albumin 3.3 (*)    Total Bilirubin <0.2 (*)    All other components within normal limits  CBC - Abnormal; Notable for the following:    Hemoglobin 10.7 (*)    HCT 33.8 (*)    All other components within normal limits  SEDIMENTATION RATE - Abnormal; Notable for the following:    Sed Rate 35 (*)    All other components within normal limits  C-REACTIVE PROTEIN - Abnormal; Notable for the following:    CRP 3.1 (*)    All other components within normal limits  CLOSTRIDIUM DIFFICILE BY PCR  URINE CULTURE  CULTURE, BLOOD (ROUTINE X 2)  CULTURE, BLOOD (ROUTINE X 2)  ANAEROBIC CULTURE  CULTURE, ROUTINE-ABSCESS  AEROBIC CULTURE  URINALYSIS, ROUTINE W REFLEX MICROSCOPIC  PROTIME-INR  C-REACTIVE PROTEIN    Imaging Review No results found.   EKG Interpretation   Date/Time:  Wednesday November 16 2013 21:40:05 EDT Ventricular Rate:  79 PR Interval:  150 QRS Duration: 82 QT Interval:  395 QTC Calculation: 453 R Axis:   46 Text Interpretation:  Age not entered, assumed to be  58 years old for  purpose of ECG interpretation Sinus rhythm Nonspecific T  abnormalities,  lateral leads Confirmed by HARRISON  MD, FORREST ([id number]) on 11/17/2013  12:23:54 AM      MDM   Final diagnoses:  Osteomyelitis of lumbar spine    9:35 PM 58 y.o. female with history of lower back pain since April who presents with an MRI today showing osteomyelitis of the lumbar spine. She denies any fevers or bowel/bladder incontinence. She states that over the last 2 days she has developed left foot numbness and some imbalance. She states that she has almost fallen several times and fell off the toilet her head but did not lose consciousness and fell on a soft mat. She states that she has had some very mild left leg weakness since her pain started in April. She is afebrile and vital signs are unremarkable here. She has minimal to no pain on exam currently and does not want any pain medicine. Will get screening labwork and consult neurosurgery.  Discussed w/ Dr. Yetta Barre. Will admit to hospitalist.     Junius Argyle, MD 11/17/13 [phone]

## 2013-11-16 NOTE — ED Notes (Signed)
Md at bedside

## 2013-11-16 NOTE — ED Notes (Signed)
Talked at length with family and pt regarding concerns over pt's thought of being direct admited VS being evaluated in the ED.  Family was very accepting of the discussion and pleasant the entire time.  MD made aware

## 2013-11-17 ENCOUNTER — Inpatient Hospital Stay (HOSPITAL_COMMUNITY): Payer: Managed Care, Other (non HMO)

## 2013-11-17 ENCOUNTER — Encounter (HOSPITAL_COMMUNITY): Payer: Self-pay | Admitting: *Deleted

## 2013-11-17 DIAGNOSIS — R197 Diarrhea, unspecified: Secondary | ICD-10-CM | POA: Diagnosis present

## 2013-11-17 DIAGNOSIS — L03116 Cellulitis of left lower limb: Secondary | ICD-10-CM

## 2013-11-17 DIAGNOSIS — L03115 Cellulitis of right lower limb: Secondary | ICD-10-CM | POA: Diagnosis present

## 2013-11-17 LAB — COMPREHENSIVE METABOLIC PANEL
ALBUMIN: 3.3 g/dL — AB (ref 3.5–5.2)
ALT: 24 U/L (ref 0–35)
AST: 16 U/L (ref 0–37)
Alkaline Phosphatase: 97 U/L (ref 39–117)
BUN: 18 mg/dL (ref 6–23)
CO2: 24 mEq/L (ref 19–32)
Calcium: 9.4 mg/dL (ref 8.4–10.5)
Chloride: 102 mEq/L (ref 96–112)
Creatinine, Ser: 0.79 mg/dL (ref 0.50–1.10)
GFR calc Af Amer: >90 mL/min (ref >90)
GFR calc non Af Amer: >90 mL/min (ref >90)
Glucose, Bld: 121 mg/dL — ABNORMAL HIGH (ref 70–99)
Potassium: 3.9 mEq/L (ref 3.7–5.3)
Sodium: 140 mEq/L (ref 137–147)
TOTAL PROTEIN: 6.7 g/dL (ref 6.0–8.3)
Total Bilirubin: <0.2 mg/dL — ABNORMAL LOW (ref 0.3–1.2)

## 2013-11-17 LAB — CBC
HCT: 33.8 % — ABNORMAL LOW (ref 36.0–46.0)
HEMOGLOBIN: 10.7 g/dL — AB (ref 12.0–15.0)
MCH: 27.3 pg (ref 26.0–34.0)
MCHC: 31.7 g/dL (ref 30.0–36.0)
MCV: 86.2 fL (ref 78.0–100.0)
Platelets: 248 10*3/uL (ref 150–400)
RBC: 3.92 MIL/uL (ref 3.87–5.11)
RDW: 13.7 % (ref 11.5–15.5)
WBC: 10.2 10*3/uL (ref 4.0–10.5)

## 2013-11-17 LAB — C-REACTIVE PROTEIN
CRP: 3.1 mg/dL — ABNORMAL HIGH (ref <0.60)
CRP: 1.6 mg/dL — ABNORMAL HIGH (ref <0.60)

## 2013-11-17 LAB — CLOSTRIDIUM DIFFICILE BY PCR: CDIFFPCR: NEGATIVE

## 2013-11-17 LAB — PROTIME-INR
INR: 1.1 (ref 0.00–1.49)
Prothrombin Time: 14.2 seconds (ref 11.6–15.2)

## 2013-11-17 LAB — SEDIMENTATION RATE
SED RATE: 35 mm/h — AB (ref 0–22)
Sed Rate: 40 mm/hr — ABNORMAL HIGH (ref 0–22)

## 2013-11-17 MED ORDER — FENTANYL CITRATE 0.05 MG/ML IJ SOLN
INTRAMUSCULAR | Status: AC | PRN
  Administered 2013-11-17 (×2): 25 ug via INTRAVENOUS

## 2013-11-17 MED ORDER — SODIUM CHLORIDE 0.9 % IV SOLN
INTRAVENOUS | Status: DC
  Administered 2013-11-17 (×2): via INTRAVENOUS

## 2013-11-17 MED ORDER — PANTOPRAZOLE SODIUM 40 MG PO TBEC: 40.0000 mg | DELAYED_RELEASE_TABLET | Freq: Every day | ORAL | Status: DC

## 2013-11-17 MED ORDER — DIAZEPAM 5 MG PO TABS
10.0000 mg | ORAL_TABLET | Freq: Three times a day (TID) | ORAL | Status: DC | PRN
  Administered 2013-11-17 – 2013-11-18 (×3): 10 mg via ORAL
  Filled 2013-11-17 (×3): qty 2

## 2013-11-17 MED ORDER — ACETAMINOPHEN 650 MG RE SUPP: 650.0000 mg | Freq: Four times a day (QID) | RECTAL | Status: DC | PRN

## 2013-11-17 MED ORDER — FENTANYL CITRATE 0.05 MG/ML IJ SOLN
INTRAMUSCULAR | Status: AC
  Filled 2013-11-17: qty 2

## 2013-11-17 MED ORDER — PANTOPRAZOLE SODIUM 40 MG IV SOLR
40.0000 mg | Freq: Two times a day (BID) | INTRAVENOUS | Status: DC
  Filled 2013-11-17 (×3): qty 40

## 2013-11-17 MED ORDER — HYDROCHLOROTHIAZIDE 12.5 MG PO CAPS: 12.5000 mg | ORAL_CAPSULE | Freq: Every day | ORAL | Status: DC

## 2013-11-17 MED ORDER — MORPHINE SULFATE 2 MG/ML IJ SOLN
2.0000 mg | INTRAMUSCULAR | Status: DC | PRN
  Administered 2013-11-17 – 2013-11-18 (×2): 2 mg via INTRAVENOUS
  Filled 2013-11-17 (×2): qty 1

## 2013-11-17 MED ORDER — DICLOFENAC SODIUM 75 MG PO TBEC
75.0000 mg | DELAYED_RELEASE_TABLET | Freq: Two times a day (BID) | ORAL | Status: DC
  Filled 2013-11-17 (×5): qty 1

## 2013-11-17 MED ORDER — FLUOXETINE HCL 20 MG PO CAPS
20.0000 mg | ORAL_CAPSULE | Freq: Every day | ORAL | Status: DC
  Administered 2013-11-18: 20 mg via ORAL
  Filled 2013-11-17 (×2): qty 1

## 2013-11-17 MED ORDER — PANTOPRAZOLE SODIUM 40 MG PO TBEC
40.0000 mg | DELAYED_RELEASE_TABLET | Freq: Every day | ORAL | Status: DC
  Administered 2013-11-17 – 2013-11-18 (×2): 40 mg via ORAL
  Filled 2013-11-17 (×2): qty 1

## 2013-11-17 MED ORDER — GABAPENTIN 600 MG PO TABS
600.0000 mg | ORAL_TABLET | Freq: Three times a day (TID) | ORAL | Status: DC
Start: 2013-11-17 — End: 2013-11-18
  Administered 2013-11-17 – 2013-11-18 (×6): 600 mg via ORAL
  Filled 2013-11-17 (×9): qty 1

## 2013-11-17 MED ORDER — MIDAZOLAM HCL 2 MG/2ML IJ SOLN
INTRAMUSCULAR | Status: AC | PRN
  Administered 2013-11-17 (×2): 1 mg via INTRAVENOUS

## 2013-11-17 MED ORDER — HEPARIN SODIUM (PORCINE) 5000 UNIT/ML IJ SOLN
5000.0000 [IU] | Freq: Three times a day (TID) | INTRAMUSCULAR | Status: DC
  Administered 2013-11-17 – 2013-11-18 (×3): 5000 [IU] via SUBCUTANEOUS
  Filled 2013-11-17 (×6): qty 1

## 2013-11-17 MED ORDER — LISINOPRIL 10 MG PO TABS
10.0000 mg | ORAL_TABLET | Freq: Every day | ORAL | Status: DC
  Filled 2013-11-17 (×2): qty 1

## 2013-11-17 MED ORDER — SODIUM CHLORIDE 0.9 % IV SOLN: INTRAVENOUS | Status: AC

## 2013-11-17 MED ORDER — OXYCODONE-ACETAMINOPHEN 5-325 MG PO TABS
1.0000 | ORAL_TABLET | ORAL | Status: DC | PRN
  Administered 2013-11-17 – 2013-11-18 (×6): 2 via ORAL
  Filled 2013-11-17 (×6): qty 2

## 2013-11-17 MED ORDER — LISINOPRIL-HYDROCHLOROTHIAZIDE 10-12.5 MG PO TABS: 1.0000 | ORAL_TABLET | Freq: Every day | ORAL | Status: DC

## 2013-11-17 MED ORDER — LORATADINE 10 MG PO TABS
10.0000 mg | ORAL_TABLET | Freq: Every day | ORAL | Status: DC | PRN
  Filled 2013-11-17: qty 1

## 2013-11-17 MED ORDER — CYCLOBENZAPRINE HCL 10 MG PO TABS
10.0000 mg | ORAL_TABLET | Freq: Three times a day (TID) | ORAL | Status: DC | PRN
  Administered 2013-11-17 – 2013-11-18 (×4): 10 mg via ORAL
  Filled 2013-11-17 (×4): qty 1

## 2013-11-17 MED ORDER — ONDANSETRON HCL 4 MG PO TABS: 4.0000 mg | ORAL_TABLET | Freq: Four times a day (QID) | ORAL | Status: DC | PRN

## 2013-11-17 MED ORDER — MIDAZOLAM HCL 2 MG/2ML IJ SOLN
INTRAMUSCULAR | Status: AC
  Filled 2013-11-17: qty 4

## 2013-11-17 MED ORDER — ACETAMINOPHEN 325 MG PO TABS: 650.0000 mg | ORAL_TABLET | Freq: Four times a day (QID) | ORAL | Status: DC | PRN

## 2013-11-17 MED ORDER — ONDANSETRON HCL 4 MG/2ML IJ SOLN: 4.0000 mg | Freq: Four times a day (QID) | INTRAMUSCULAR | Status: DC | PRN

## 2013-11-17 MED ORDER — TRIAMCINOLONE ACETONIDE 55 MCG/ACT NA AERO: 1.0000 | INHALATION_SPRAY | Freq: Every day | NASAL | Status: DC | PRN

## 2013-11-17 MED ORDER — ONDANSETRON HCL 4 MG/2ML IJ SOLN
4.0000 mg | Freq: Four times a day (QID) | INTRAMUSCULAR | Status: DC | PRN
  Administered 2013-11-17 (×2): 4 mg via INTRAVENOUS
  Filled 2013-11-17 (×2): qty 2

## 2013-11-17 NOTE — Progress Notes (Signed)
Subjective: Back pain, feet numb, diarrhea better. Has had trouble keeping balance recently.  Objective: Vital signs in last 24 hours: Temp:  [97.4 F (36.3 C)-97.8 F (36.6 C)] 97.8 F (36.6 C) (06/25 0545) Pulse Rate:  [70-92] 72 (06/25 0545) Resp:  [16-24] 16 (06/25 0545) BP: (90-154)/(58-87) 90/58 mmHg (06/25 0545) SpO2:  [90 %-100 %] 97 % (06/25 0545) Weight:  [74.39 kg (164 lb)-76.4 kg (168 lb 6.9 oz)] 76.4 kg (168 lb 6.9 oz) (06/25 0545) Weight change:  Last BM Date: 11/17/13  Intake/Output from previous day: 06/24 0701 - 06/25 0700 In: -  Out: 2 [Urine:1; Stool:1] Intake/Output this shift:    General appearance: alert and cooperative Resp: clear to auscultation bilaterally Cardio: regular rate and rhythm, S1, S2 normal, no murmur, click, rub or gallop Extremities: no erythema in knees Neuro:  Strength normal in lower extremities  Lab Results:  Recent Labs  11/16/13 2136 11/17/13 0345  WBC 9.4 10.2  HGB 11.3* 10.7*  HCT 35.4* 33.8*  PLT 289 248   BMET  Recent Labs  11/16/13 2136 11/17/13 0345  NA 141 140  K 3.9 3.9  CL 100 102  CO2 25 24  GLUCOSE 146* 121*  BUN 15 18  CREATININE 0.72 0.79  CALCIUM 9.9 9.4    Studies/Results: No results found.  Medications: I have reviewed the patient's current medications.  Assessment/Plan: Principal Problem:   Possible Osteomyelitis of lumbar spine, Neurosurgery to see and IR to perform aspiration today. No antibiotics yet Active Problems:   Hypertension ok   Diarrhea resolved with D/C of prednisone, C Diff negative, follow   LOS: 1 day   GRIFFIN,JOHN JOSEPH 11/17/2013, 7:52 AM

## 2013-11-17 NOTE — Sedation Documentation (Signed)
Patient denies pain and is resting comfortably.  

## 2013-11-17 NOTE — Procedures (Signed)
S/P  L3-L4 fluoro guided disc aspiration. Approx 4cc of thick bloody stained fluid obtained   And sent for  Analysis.

## 2013-11-17 NOTE — Sedation Documentation (Signed)
MD at bedside.d/w family and pt poc

## 2013-11-17 NOTE — Consult Note (Signed)
Reason for Consult: Possible discitis Referring Physician: Hospitalist  Eden Lathenna Leyba is an 58 y.o. female.   HPI:  58 year old female who is admitted with a several month history of progressive back pain. Pain became much worse in April. She has had bilateral leg pain that has been progressive. It was posterior and lateral and in the last few days has involved the anterior thighs. At this point she has no back pain or leg pain and she is resting comfortably in bed. She had a recent MRI showed change within the disc space consistent with her worrisome for discitis and she was admitted for workup. She has had no recent scanner tooth infections or other obvious source. She has had a needle aspiration of the disc artery performed today. She has some numbness in her feet. She has had some imbalance in her gait. No fevers or chills.  Past Medical History  Diagnosis Date  . Hypertension   . Asthma   . Neck pain   . Anxiety     Past Surgical History  Procedure Laterality Date  . Tonsillectomy    . Tubal ligation    . Btl      Allergies  Allergen Reactions  . Erythromycin     N+V  . Latex     Irritation of the skin  . Penicillins     Hives  . Shellfish Allergy     GI- Facial  Acne  . Strawberry     Mouth ulcers   . Sulfa Antibiotics     Hives  . Vicodin [Hydrocodone-Acetaminophen] Other (See Comments)    Restless, tolerate percocet    History  Substance Use Topics  . Smoking status: Never Smoker   . Smokeless tobacco: Never Used  . Alcohol Use: No    Family History  Problem Relation Age of Onset  . Other Father   . Hypertension Father      Review of Systems  Positive ROS: Negative  All other systems have been reviewed and were otherwise negative with the exception of those mentioned in the HPI and as above.  Objective: Vital signs in last 24 hours: Temp:  [97.4 F (36.3 C)-97.8 F (36.6 C)] 97.8 F (36.6 C) (06/25 0545) Pulse Rate:  [63-92] 67 (06/25 1051) Resp:   [10-24] 12 (06/25 1051) BP: (90-154)/(58-87) 125/75 mmHg (06/25 1051) SpO2:  [90 %-100 %] 100 % (06/25 1051) Weight:  [74.39 kg (164 lb)-76.4 kg (168 lb 6.9 oz)] 76.4 kg (168 lb 6.9 oz) (06/25 0545)  General Appearance: Alert, cooperative, no distress, appears stated age Head: Normocephalic, without obvious abnormality, atraumatic Eyes: PERRL, conjunctiva/corneas clear, EOM's intact     Throat: benign Neck: Supple, symmetrical, trachea midline Lungs: respirations unlabored Heart: Regular rate and rhythm Abdomen: Soft Extremities: Extremities normal, atraumatic, no cyanosis or edema   NEUROLOGIC:   Mental status: A&O x4, no aphasia, good attention span, Memory and fund of knowledge Motor Exam - grossly normal, normal tone and bulk, he does have 3-5 strength in the right dorsiflexors and 4/5 strength in the left dorsiflexors Sensory Exam - grossly normal Reflexes: symmetric, no pathologic reflexes, No Hoffman's, No clonus Coordination - grossly normal Gait - not tested Balance - not tested Cranial Nerves: I: smell Not tested  II: visual acuity  OS: na    OD: na   Full to confrontation  II: pupils Equal, round, reactive to light  III,VII: ptosis None  III,IV,VI: extraocular muscles  Full ROM  V: mastication Normal  V: facial  light touch sensation  Normal  V,VII: corneal reflex  Present  VII: facial muscle function - upper  Normal  VII: facial muscle function - lower Normal  VIII: hearing Not tested  IX: soft palate elevation  Normal  IX,X: gag reflex Present  XI: trapezius strength  5/5  XI: sternocleidomastoid strength 5/5  XI: neck flexion strength  5/5  XII: tongue strength  Normal    Data Review Lab Results  Component Value Date   WBC 10.2 11/17/2013   HGB 10.7* 11/17/2013   HCT 33.8* 11/17/2013   MCV 86.2 11/17/2013   PLT 248 11/17/2013   Lab Results  Component Value Date   NA 140 11/17/2013   K 3.9 11/17/2013   CL 102 11/17/2013   CO2 24 11/17/2013   BUN 18  11/17/2013   CREATININE 0.79 11/17/2013   GLUCOSE 121* 11/17/2013   Lab Results  Component Value Date   INR 1.10 11/17/2013    Radiology: No results found.   Assessment/Plan:  Findings on recent MRI worrisome for L3-4 discitis. She also has significant spinal stenosis at L3-4 and L4-5.  Await cultures from needle aspiration as well as blood cultures. I would not start antibiotics until we have a pathogen. If her needle aspiration comes back negative I would repeat aspiration before starting antibiotics.  Since sedimentation rate and CRP for baseline.   JONES,DAVID S 11/17/2013 12:35 PM

## 2013-11-17 NOTE — Progress Notes (Signed)
Orthopedic Tech Progress Note Patient Details:  Chloe Johnson 1955/07/29 [id number]  Ortho Devices Ortho Device/Splint Location: trapeze bar patient helper Ortho Device/Splint Interventions: Application   Nikki Dom 11/17/2013, 7:59 PM

## 2013-11-17 NOTE — H&P (Signed)
Chloe Johnson is an 58 y.o. female.   Chief Complaint: continued back pain since April 2015 Sudden worsening few days ago No injury; no surgery MRI shows Lumbar 3-4 discitis [facility] has requested consult for aspiration Dr Estanislado Pandy has reviewed imaging and chart Now scheduled for L3-4 disc aspiration  HPI: HTN; asthma; back pain  Past Medical History  Diagnosis Date  . Hypertension   . Asthma   . Neck pain   . Anxiety     Past Surgical History  Procedure Laterality Date  . Tonsillectomy    . Tubal ligation    . Btl      Family History  Problem Relation Age of Onset  . Other Father   . Hypertension Father    Social History:  reports that she has never smoked. She has never used smokeless tobacco. She reports that she does not drink alcohol or use illicit drugs.  Allergies:  Allergies  Allergen Reactions  . Erythromycin     N+V  . Latex     Irritation of the skin  . Penicillins     Hives  . Shellfish Allergy     GI- Facial  Acne  . Strawberry     Mouth ulcers   . Sulfa Antibiotics     Hives  . Vicodin [Hydrocodone-Acetaminophen] Other (See Comments)    Restless, tolerate percocet    Medications Prior to Admission  Medication Sig Dispense Refill  . cyclobenzaprine (FLEXERIL) 5 MG tablet Take 10 mg by mouth 3 (three) times daily as needed for muscle spasms.      . diazepam (VALIUM) 10 MG tablet Take 10 mg by mouth daily as needed. For spasms      . diclofenac (VOLTAREN) 75 MG EC tablet Take 75 mg by mouth 2 (two) times daily.       Marland Kitchen esomeprazole (NEXIUM) 40 MG capsule Take 40 mg by mouth daily at 12 noon.      Marland Kitchen EVENING PRIMROSE OIL PO Take 1,000 mg by mouth 2 (two) times daily.      Marland Kitchen FLUoxetine (PROZAC) 20 MG capsule Take 20 mg by mouth daily.       Marland Kitchen gabapentin (NEURONTIN) 600 MG tablet Take 600 mg by mouth 4 (four) times daily.       Marland Kitchen lisinopril-hydrochlorothiazide (PRINZIDE,ZESTORETIC) 10-12.5 MG per tablet Take 1 tablet by mouth daily.      Marland Kitchen loratadine  (CLARITIN) 10 MG tablet Take 10 mg by mouth daily as needed for allergies.      . Omega-3 Fatty Acids (FISH OIL) 1200 MG CAPS Take 1,200 mg by mouth 2 (two) times daily.      Marland Kitchen OVER THE COUNTER MEDICATION Take 1 tablet by mouth 2 (two) times daily. "Estrovera"      . OVER THE COUNTER MEDICATION Take 1-3 tablets by mouth daily as needed (for constipation). "Super Cleanse"      . oxyCODONE-acetaminophen (PERCOCET) 5-325 MG per tablet 1 to 2 tablets every 6 hours as needed for pain.  20 tablet  0  . potassium chloride SA (K-DUR,KLOR-CON) 20 MEQ tablet Take 20 mEq by mouth 2 (two) times daily.      . predniSONE (DELTASONE) 10 MG tablet Take 10-60 mg by mouth See admin instructions. Take 60 mg by mouth daily for 2 days, 50 mg daily for 2 days, 40 mg daily for 2 days, 30 mg daily for 2 days, 20 mg daily for 2 days, and 10 mg daily for 2 days.      Marland Kitchen  triamcinolone (NASACORT ALLERGY 24HR) 55 MCG/ACT AERO nasal inhaler Place 1 spray into both nostrils daily as needed (for allergies).        Results for orders placed during the hospital encounter of 11/16/13 (from the past 48 hour(s))  CBC WITH DIFFERENTIAL     Status: Abnormal   Collection Time    11/16/13  9:36 PM      Result Value Ref Range   WBC 9.4  4.0 - 10.5 K/uL   RBC 4.13  3.87 - 5.11 MIL/uL   Hemoglobin 11.3 (*) 12.0 - 15.0 g/dL   HCT 35.4 (*) 36.0 - 46.0 %   MCV 85.7  78.0 - 100.0 fL   MCH 27.4  26.0 - 34.0 pg   MCHC 31.9  30.0 - 36.0 g/dL   RDW 13.5  11.5 - 15.5 %   Platelets 289  150 - 400 K/uL   Neutrophils Relative % 85 (*) 43 - 77 %   Neutro Abs 7.9 (*) 1.7 - 7.7 K/uL   Lymphocytes Relative 13  12 - 46 %   Lymphs Abs 1.2  0.7 - 4.0 K/uL   Monocytes Relative 2 (*) 3 - 12 %   Monocytes Absolute 0.2  0.1 - 1.0 K/uL   Eosinophils Relative 0  0 - 5 %   Eosinophils Absolute 0.0  0.0 - 0.7 K/uL   Basophils Relative 0  0 - 1 %   Basophils Absolute 0.0  0.0 - 0.1 K/uL  COMPREHENSIVE METABOLIC PANEL     Status: Abnormal   Collection  Time    11/16/13  9:36 PM      Result Value Ref Range   Sodium 141  137 - 147 mEq/L   Potassium 3.9  3.7 - 5.3 mEq/L   Chloride 100  96 - 112 mEq/L   CO2 25  19 - 32 mEq/L   Glucose, Bld 146 (*) 70 - 99 mg/dL   BUN 15  6 - 23 mg/dL   Creatinine, Ser 0.72  0.50 - 1.10 mg/dL   Calcium 9.9  8.4 - 10.5 mg/dL   Total Protein 7.3  6.0 - 8.3 g/dL   Albumin 3.6  3.5 - 5.2 g/dL   AST 18  0 - 37 U/L   ALT 28  0 - 35 U/L   Alkaline Phosphatase 108  39 - 117 U/L   Total Bilirubin <0.2 (*) 0.3 - 1.2 mg/dL   GFR calc non Af Amer >90  >90 mL/min   GFR calc Af Amer >90  >90 mL/min   Comment: (NOTE)     The eGFR has been calculated using the CKD EPI equation.     This calculation has not been validated in all clinical situations.     eGFR's persistently <90 mL/min signify possible Chronic Kidney     Disease.  URINALYSIS, ROUTINE W REFLEX MICROSCOPIC     Status: None   Collection Time    11/16/13  9:59 PM      Result Value Ref Range   Color, Urine YELLOW  YELLOW   APPearance CLEAR  CLEAR   Specific Gravity, Urine 1.011  1.005 - 1.030   pH 7.5  5.0 - 8.0   Glucose, UA NEGATIVE  NEGATIVE mg/dL   Hgb urine dipstick NEGATIVE  NEGATIVE   Bilirubin Urine NEGATIVE  NEGATIVE   Ketones, ur NEGATIVE  NEGATIVE mg/dL   Protein, ur NEGATIVE  NEGATIVE mg/dL   Urobilinogen, UA 0.2  0.0 - 1.0 mg/dL  Nitrite NEGATIVE  NEGATIVE   Leukocytes, UA NEGATIVE  NEGATIVE   Comment: MICROSCOPIC NOT DONE ON URINES WITH NEGATIVE PROTEIN, BLOOD, LEUKOCYTES, NITRITE, OR GLUCOSE <1000 mg/dL.  SEDIMENTATION RATE     Status: Abnormal   Collection Time    11/16/13 11:09 PM      Result Value Ref Range   Sed Rate 40 (*) 0 - 22 mm/hr  CLOSTRIDIUM DIFFICILE BY PCR     Status: None   Collection Time    11/17/13  2:03 AM      Result Value Ref Range   C difficile by pcr NEGATIVE  NEGATIVE  COMPREHENSIVE METABOLIC PANEL     Status: Abnormal   Collection Time    11/17/13  3:45 AM      Result Value Ref Range   Sodium 140   137 - 147 mEq/L   Potassium 3.9  3.7 - 5.3 mEq/L   Chloride 102  96 - 112 mEq/L   CO2 24  19 - 32 mEq/L   Glucose, Bld 121 (*) 70 - 99 mg/dL   BUN 18  6 - 23 mg/dL   Creatinine, Ser 0.79  0.50 - 1.10 mg/dL   Calcium 9.4  8.4 - 10.5 mg/dL   Total Protein 6.7  6.0 - 8.3 g/dL   Albumin 3.3 (*) 3.5 - 5.2 g/dL   AST 16  0 - 37 U/L   ALT 24  0 - 35 U/L   Alkaline Phosphatase 97  39 - 117 U/L   Total Bilirubin <0.2 (*) 0.3 - 1.2 mg/dL   GFR calc non Af Amer >90  >90 mL/min   GFR calc Af Amer >90  >90 mL/min   Comment: (NOTE)     The eGFR has been calculated using the CKD EPI equation.     This calculation has not been validated in all clinical situations.     eGFR's persistently <90 mL/min signify possible Chronic Kidney     Disease.  CBC     Status: Abnormal   Collection Time    11/17/13  3:45 AM      Result Value Ref Range   WBC 10.2  4.0 - 10.5 K/uL   RBC 3.92  3.87 - 5.11 MIL/uL   Hemoglobin 10.7 (*) 12.0 - 15.0 g/dL   HCT 33.8 (*) 36.0 - 46.0 %   MCV 86.2  78.0 - 100.0 fL   MCH 27.3  26.0 - 34.0 pg   MCHC 31.7  30.0 - 36.0 g/dL   RDW 13.7  11.5 - 15.5 %   Platelets 248  150 - 400 K/uL  PROTIME-INR     Status: None   Collection Time    11/17/13  3:45 AM      Result Value Ref Range   Prothrombin Time 14.2  11.6 - 15.2 seconds   INR 1.10  0.00 - 1.49   No results found.  Review of Systems  Constitutional: Positive for chills. Negative for fever.  Respiratory: Negative for cough and shortness of breath.   Cardiovascular: Negative for chest pain.  Gastrointestinal: Positive for abdominal pain. Negative for nausea and vomiting.  Musculoskeletal: Positive for back pain and joint pain.  Neurological: Positive for weakness. Negative for dizziness.    Blood pressure 90/58, pulse 72, temperature 97.8 F (36.6 C), temperature source Oral, resp. rate 16, height $RemoveBe'5\' 1"'twejiNBXP$  (1.549 m), weight 76.4 kg (168 lb 6.9 oz), SpO2 97.00%. Physical Exam  Constitutional: She is oriented to  person, place, and  time. She appears well-developed and well-nourished.  Cardiovascular: Normal rate and regular rhythm.   No murmur heard. Respiratory: Effort normal and breath sounds normal. She has no wheezes.  GI: Soft. Bowel sounds are normal. There is no tenderness.  Musculoskeletal: Normal range of motion. She exhibits no edema.  Low back pain  Neurological: She is alert and oriented to person, place, and time.  Skin: Skin is warm and dry.     Assessment/Plan Worsening back pain Abn MRI revealing L3-4 discitis Scheduled now for L3-4 disc aspiration Pt aware of procedure benefits and risks and agreeable to proceed Consent signed and in chart  TURPIN,PAMELA A 11/17/2013, 9:00 AM

## 2013-11-18 LAB — URINE CULTURE

## 2013-11-18 NOTE — Discharge Instructions (Signed)
If you develops high fever, chills, severe pain or any neurologic change, go the hospital

## 2013-11-18 NOTE — Progress Notes (Signed)
Subjective: Pain about the same  Objective: Vital signs in last 24 hours: Temp:  [97.4 F (36.3 C)-98.1 F (36.7 C)] 97.6 F (36.4 C) (06/26 0511) Pulse Rate:  [63-77] 67 (06/26 0511) Resp:  [10-17] 14 (06/26 0511) BP: (88-135)/(52-85) 95/52 mmHg (06/26 0511) SpO2:  [91 %-100 %] 91 % (06/26 0511) Weight change:  Last BM Date: 11/17/13  Intake/Output from previous day: 06/25 0701 - 06/26 0700 In: 767.5 [I.V.:767.5] Out: 1 [Urine:1] Intake/Output this shift:    General appearance: alert and cooperative Resp: clear to auscultation bilaterally Cardio: regular rate and rhythm, S1, S2 normal, no murmur, click, rub or gallop  Lab Results:  Recent Labs  11/16/13 2136 11/17/13 0345  WBC 9.4 10.2  HGB 11.3* 10.7*  HCT 35.4* 33.8*  PLT 289 248   BMET  Recent Labs  11/16/13 2136 11/17/13 0345  NA 141 140  K 3.9 3.9  CL 100 102  CO2 25 24  GLUCOSE 146* 121*  BUN 15 18  CREATININE 0.72 0.79  CALCIUM 9.9 9.4    Studies/Results: No results found.  Medications: I have reviewed the patient's current medications.  Assessment/Plan: Principal Problem:  Possible Osteomyelitis of lumbar spine,appreciate Neurosurgery input, cultures pending.  No antibiotics yet  Active Problems:  Hypertension BP low, increase IVFs and holding lisinopril/hctz Diarrhea resolved with D/C of prednisone, C Diff negative, follow Disposition discuss with neurosurgery   LOS: 2 days   GRIFFIN,JOHN JOSEPH 11/18/2013, 7:54 AM

## 2013-11-20 LAB — CULTURE, ROUTINE-ABSCESS: CULTURE: NO GROWTH

## 2013-11-22 ENCOUNTER — Other Ambulatory Visit: Payer: Self-pay | Admitting: Internal Medicine

## 2013-11-22 DIAGNOSIS — M545 Low back pain, unspecified: Secondary | ICD-10-CM

## 2013-11-22 LAB — ANAEROBIC CULTURE

## 2013-11-22 NOTE — Discharge Summary (Signed)
Physician Discharge Summary  Patient ID: Chloe Johnson MRN: [id number] DOB/AGE: 1956/03/25 58 y.o.  Admit date: 11/16/2013 Discharge date: 11/22/2013  Admission Diagnoses: Back pain Possible discitis lumbar spine Hypertension Diarrhea  Discharge Diagnoses:  Principal Problem:   Back pain Active Problems: Possible discitis lumbar spine   Hypertension   Anxiety   Diarrhea   Discharged Condition: good  Hospital Course: The patient was admitted on June 25 with worsening low back pain. She had chronic back pain since April 2013 with worsening in the last several days. An MRI done as an outpatient show possible L3-4 discitis. the patient was admitted.Marland Kitchen Antibiotics were not started at the recommendation of neurosurgery. The patient was treated with her outpatient pain regimen and also morphine sulfate IV. On June 26 interventional radiology performed at L3-4 disc aspiration without complication. Cultures were sent. She was seen by Dr. Wynetta Emery neurosurgery service who felt her MRI was worrisome for L3-4 discitis and recommended holding antibiotics until pathogen obtained. The patient's blood pressure was low and lisinopril HCT was held. She has some diarrhea, C. difficile antigen negative. Diarrhea resolved with discontinuation of prednisone.  Consults: Neurosurgery  Significant Diagnostic Studies: labs: ESR 35, C-reactive protein 1.6, WBC 10.2, hemoglobin 10.7, platelet 248 and microbiology: blood culture: negative and L3-4 aspiration cultures pending  Treatments: analgesia: Morphine and oxycodone, gabapentin, Flexeril  Discharge Exam: Blood pressure 104/54, pulse 78, temperature 97.4 F (36.3 C), temperature source Oral, resp. rate 18, height $RemoveBe'5\' 1"'jMbmjGxoP$  (1.549 m), weight 76.4 kg (168 lb 6.9 oz), SpO2 99.00%. General appearance: alert and cooperative  Disposition: 01-Home or Self Care     Medication List    STOP taking these medications       diclofenac 75 MG EC tablet  Commonly known  as:  VOLTAREN     lisinopril-hydrochlorothiazide 10-12.5 MG per tablet  Commonly known as:  PRINZIDE,ZESTORETIC     potassium chloride SA 20 MEQ tablet  Commonly known as:  K-DUR,KLOR-CON     predniSONE 10 MG tablet  Commonly known as:  DELTASONE      TAKE these medications       cyclobenzaprine 5 MG tablet  Commonly known as:  FLEXERIL  Take 10 mg by mouth 3 (three) times daily as needed for muscle spasms.     diazepam 10 MG tablet  Commonly known as:  VALIUM  Take 10 mg by mouth daily as needed. For spasms     esomeprazole 40 MG capsule  Commonly known as:  NEXIUM  Take 40 mg by mouth daily at 12 noon.     EVENING PRIMROSE OIL PO  Take 1,000 mg by mouth 2 (two) times daily.     Fish Oil 1200 MG Caps  Take 1,200 mg by mouth 2 (two) times daily.     FLUoxetine 20 MG capsule  Commonly known as:  PROZAC  Take 20 mg by mouth daily.     gabapentin 600 MG tablet  Commonly known as:  NEURONTIN  Take 600 mg by mouth 4 (four) times daily.     loratadine 10 MG tablet  Commonly known as:  CLARITIN  Take 10 mg by mouth daily as needed for allergies.     NASACORT ALLERGY 24HR 55 MCG/ACT Aero nasal inhaler  Generic drug:  triamcinolone  Place 1 spray into both nostrils daily as needed (for allergies).     OVER THE COUNTER MEDICATION  Take 1 tablet by mouth 2 (two) times daily. "Estrovera"     OVER THE COUNTER  MEDICATION  Take 1-3 tablets by mouth daily as needed (for constipation). "Super Cleanse"     oxyCODONE-acetaminophen 5-325 MG per tablet  Commonly known as:  PERCOCET  1 to 2 tablets every 6 hours as needed for pain.           Follow-up Information   Follow up with JONES,DAVID S, MD. Schedule an appointment as soon as possible for a visit in 2 weeks.   Specialty:  Neurosurgery   Contact information:   [address] [place] [address] [phone]       Follow up with Irven Shelling, MD. (Dr. Laurann Montana will contact you next week)     Specialty:  Internal Medicine   Contact information:   [address] [place] [address] [phone]       Signed: Irven Shelling 11/22/2013, 6:25 AM

## 2013-11-23 ENCOUNTER — Other Ambulatory Visit: Payer: Self-pay | Admitting: Internal Medicine

## 2013-11-23 DIAGNOSIS — M545 Low back pain, unspecified: Secondary | ICD-10-CM

## 2013-11-23 DIAGNOSIS — M869 Osteomyelitis, unspecified: Secondary | ICD-10-CM

## 2013-11-23 HISTORY — DX: Osteomyelitis, unspecified: M86.9

## 2013-11-23 LAB — CULTURE, BLOOD (ROUTINE X 2)
CULTURE: NO GROWTH
Culture: NO GROWTH

## 2013-11-24 ENCOUNTER — Ambulatory Visit
Admission: RE | Admit: 2013-11-24 | Discharge: 2013-11-24 | Disposition: A | Discharge status: Home / Self Care | Payer: Managed Care, Other (non HMO) | Source: Ambulatory Visit | Attending: Internal Medicine | Admitting: Internal Medicine

## 2013-11-24 ENCOUNTER — Inpatient Hospital Stay (HOSPITAL_COMMUNITY)
Admission: EM | Admit: 2013-11-24 | Discharge: 2013-11-28 | DRG: 520 | Disposition: A | Discharge status: Home Health | Payer: Managed Care, Other (non HMO) | Attending: Internal Medicine | Admitting: Internal Medicine

## 2013-11-24 ENCOUNTER — Encounter (HOSPITAL_COMMUNITY): Payer: Self-pay | Admitting: Emergency Medicine

## 2013-11-24 VITALS — BP 116/71 | HR 75 | Temp 97.7°F | Resp 18

## 2013-11-24 DIAGNOSIS — K219 Gastro-esophageal reflux disease without esophagitis: Secondary | ICD-10-CM | POA: Diagnosis present

## 2013-11-24 DIAGNOSIS — M545 Low back pain, unspecified: Secondary | ICD-10-CM

## 2013-11-24 DIAGNOSIS — M4640 Discitis, unspecified, site unspecified: Secondary | ICD-10-CM | POA: Diagnosis present

## 2013-11-24 DIAGNOSIS — M4646 Discitis, unspecified, lumbar region: Secondary | ICD-10-CM

## 2013-11-24 DIAGNOSIS — Z8249 Family history of ischemic heart disease and other diseases of the circulatory system: Secondary | ICD-10-CM

## 2013-11-24 DIAGNOSIS — I959 Hypotension, unspecified: Secondary | ICD-10-CM | POA: Diagnosis present

## 2013-11-24 DIAGNOSIS — M4626 Osteomyelitis of vertebra, lumbar region: Secondary | ICD-10-CM

## 2013-11-24 DIAGNOSIS — M62838 Other muscle spasm: Secondary | ICD-10-CM | POA: Diagnosis present

## 2013-11-24 DIAGNOSIS — F411 Generalized anxiety disorder: Secondary | ICD-10-CM | POA: Diagnosis present

## 2013-11-24 DIAGNOSIS — M519 Unspecified thoracic, thoracolumbar and lumbosacral intervertebral disc disorder: Principal | ICD-10-CM

## 2013-11-24 DIAGNOSIS — I10 Essential (primary) hypertension: Secondary | ICD-10-CM

## 2013-11-24 DIAGNOSIS — J45909 Unspecified asthma, uncomplicated: Secondary | ICD-10-CM | POA: Diagnosis present

## 2013-11-24 HISTORY — DX: Discitis, unspecified, lumbar region: M46.46

## 2013-11-24 HISTORY — DX: Exercise induced bronchospasm: J45.990

## 2013-11-24 HISTORY — DX: Gastro-esophageal reflux disease without esophagitis: K21.9

## 2013-11-24 HISTORY — DX: Calculus of kidney: N20.0

## 2013-11-24 LAB — CBC WITH DIFFERENTIAL/PLATELET
Basophils Absolute: 0 10*3/uL (ref 0.0–0.1)
Basophils Relative: 0 % (ref 0–1)
Eosinophils Absolute: 0.2 10*3/uL (ref 0.0–0.7)
Eosinophils Relative: 3 % (ref 0–5)
HCT: 35.3 % — ABNORMAL LOW (ref 36.0–46.0)
HEMOGLOBIN: 11.4 g/dL — AB (ref 12.0–15.0)
LYMPHS ABS: 1.6 10*3/uL (ref 0.7–4.0)
LYMPHS PCT: 22 % (ref 12–46)
MCH: 27.9 pg (ref 26.0–34.0)
MCHC: 32.3 g/dL (ref 30.0–36.0)
MCV: 86.3 fL (ref 78.0–100.0)
Monocytes Absolute: 0.6 10*3/uL (ref 0.1–1.0)
Monocytes Relative: 8 % (ref 3–12)
NEUTROS PCT: 67 % (ref 43–77)
Neutro Abs: 5 10*3/uL (ref 1.7–7.7)
PLATELETS: 237 10*3/uL (ref 150–400)
RBC: 4.09 MIL/uL (ref 3.87–5.11)
RDW: 13.5 % (ref 11.5–15.5)
WBC: 7.4 10*3/uL (ref 4.0–10.5)

## 2013-11-24 LAB — BASIC METABOLIC PANEL
Anion gap: 16 — ABNORMAL HIGH (ref 5–15)
BUN: 11 mg/dL (ref 6–23)
CHLORIDE: 94 meq/L — AB (ref 96–112)
CO2: 26 meq/L (ref 19–32)
Calcium: 9.7 mg/dL (ref 8.4–10.5)
Creatinine, Ser: 0.76 mg/dL (ref 0.50–1.10)
GFR calc Af Amer: >90 mL/min (ref >90)
GFR calc non Af Amer: >90 mL/min (ref >90)
GLUCOSE: 99 mg/dL (ref 70–99)
POTASSIUM: 3.7 meq/L (ref 3.7–5.3)
SODIUM: 136 meq/L — AB (ref 137–147)

## 2013-11-24 LAB — C-REACTIVE PROTEIN: CRP: 4.1 mg/dL — AB (ref <0.60)

## 2013-11-24 LAB — SEDIMENTATION RATE: SED RATE: 64 mm/h — AB (ref 0–22)

## 2013-11-24 MED ORDER — POLYETHYLENE GLYCOL 3350 17 G PO PACK: 17.0000 g | PACK | Freq: Every day | ORAL | Status: DC | PRN

## 2013-11-24 MED ORDER — LEVOFLOXACIN IN D5W 750 MG/150ML IV SOLN
750.0000 mg | INTRAVENOUS | Status: DC
  Administered 2013-11-25: 750 mg via INTRAVENOUS
  Filled 2013-11-24 (×2): qty 150

## 2013-11-24 MED ORDER — SODIUM CHLORIDE 0.9 % IV SOLN
Freq: Once | INTRAVENOUS | Status: AC
  Administered 2013-11-24: 08:00:00 via INTRAVENOUS

## 2013-11-24 MED ORDER — FENTANYL CITRATE 0.05 MG/ML IJ SOLN
50.0000 ug | Freq: Once | INTRAMUSCULAR | Status: AC
  Administered 2013-11-24: 50 ug via INTRAVENOUS
  Filled 2013-11-24: qty 2

## 2013-11-24 MED ORDER — LISINOPRIL 10 MG PO TABS
10.0000 mg | ORAL_TABLET | Freq: Every day | ORAL | Status: DC
  Administered 2013-11-26: 10 mg via ORAL
  Filled 2013-11-24 (×3): qty 1

## 2013-11-24 MED ORDER — MORPHINE SULFATE 15 MG PO TABS
15.0000 mg | ORAL_TABLET | ORAL | Status: DC
  Administered 2013-11-24 – 2013-11-26 (×8): 15 mg via ORAL
  Filled 2013-11-24 (×10): qty 1

## 2013-11-24 MED ORDER — ONDANSETRON HCL 4 MG/2ML IJ SOLN: 4.0000 mg | Freq: Four times a day (QID) | INTRAMUSCULAR | Status: DC | PRN

## 2013-11-24 MED ORDER — VANCOMYCIN HCL IN DEXTROSE 1-5 GM/200ML-% IV SOLN
1000.0000 mg | Freq: Two times a day (BID) | INTRAVENOUS | Status: DC
Start: 2013-11-24 — End: 2013-11-26
  Administered 2013-11-24 – 2013-11-26 (×4): 1000 mg via INTRAVENOUS
  Filled 2013-11-24 (×6): qty 200

## 2013-11-24 MED ORDER — PANTOPRAZOLE SODIUM 40 MG PO TBEC
40.0000 mg | DELAYED_RELEASE_TABLET | Freq: Every day | ORAL | Status: DC
  Administered 2013-11-25 – 2013-11-28 (×4): 40 mg via ORAL
  Filled 2013-11-24 (×4): qty 1

## 2013-11-24 MED ORDER — CIPROFLOXACIN IN D5W 400 MG/200ML IV SOLN
400.0000 mg | Freq: Once | INTRAVENOUS | Status: DC
  Filled 2013-11-24: qty 200

## 2013-11-24 MED ORDER — POTASSIUM CHLORIDE CRYS ER 20 MEQ PO TBCR
20.0000 meq | EXTENDED_RELEASE_TABLET | Freq: Every day | ORAL | Status: DC
  Administered 2013-11-25 – 2013-11-27 (×3): 20 meq via ORAL
  Filled 2013-11-24 (×4): qty 1

## 2013-11-24 MED ORDER — GABAPENTIN 600 MG PO TABS
600.0000 mg | ORAL_TABLET | Freq: Two times a day (BID) | ORAL | Status: DC
  Administered 2013-11-24 – 2013-11-28 (×8): 600 mg via ORAL
  Filled 2013-11-24 (×7): qty 1

## 2013-11-24 MED ORDER — DIAZEPAM 5 MG PO TABS: 10.0000 mg | ORAL_TABLET | ORAL | Status: DC

## 2013-11-24 MED ORDER — FLUOXETINE HCL 20 MG PO CAPS
20.0000 mg | ORAL_CAPSULE | Freq: Every day | ORAL | Status: DC
  Administered 2013-11-24 – 2013-11-28 (×5): 20 mg via ORAL
  Filled 2013-11-24 (×5): qty 1

## 2013-11-24 MED ORDER — FENTANYL CITRATE 0.05 MG/ML IJ SOLN
25.0000 ug | INTRAMUSCULAR | Status: DC | PRN
  Administered 2013-11-24: 25 ug via INTRAVENOUS

## 2013-11-24 MED ORDER — VANCOMYCIN HCL 1000 MG IV SOLR: 15.0000 mg/kg | Freq: Once | INTRAVENOUS | Status: DC

## 2013-11-24 MED ORDER — SENNOSIDES-DOCUSATE SODIUM 8.6-50 MG PO TABS
2.0000 | ORAL_TABLET | Freq: Every day | ORAL | Status: DC
  Administered 2013-11-24 – 2013-11-26 (×3): 2 via ORAL
  Filled 2013-11-24 (×3): qty 2

## 2013-11-24 MED ORDER — HYDROCHLOROTHIAZIDE 12.5 MG PO CAPS
12.5000 mg | ORAL_CAPSULE | Freq: Every day | ORAL | Status: DC
  Administered 2013-11-26: 12.5 mg via ORAL
  Filled 2013-11-24 (×2): qty 1

## 2013-11-24 MED ORDER — DIAZEPAM 5 MG PO TABS
10.0000 mg | ORAL_TABLET | ORAL | Status: DC
  Administered 2013-11-24 – 2013-11-26 (×7): 10 mg via ORAL
  Filled 2013-11-24 (×8): qty 2

## 2013-11-24 MED ORDER — FENTANYL CITRATE 0.05 MG/ML IJ SOLN: 25.0000 ug | INTRAMUSCULAR | Status: DC | PRN

## 2013-11-24 MED ORDER — CYCLOBENZAPRINE HCL 10 MG PO TABS
10.0000 mg | ORAL_TABLET | ORAL | Status: DC
  Administered 2013-11-24 – 2013-11-25 (×2): 10 mg via ORAL
  Filled 2013-11-24 (×3): qty 1

## 2013-11-24 MED ORDER — ALUM & MAG HYDROXIDE-SIMETH 200-200-20 MG/5ML PO SUSP: 30.0000 mL | Freq: Four times a day (QID) | ORAL | Status: DC | PRN

## 2013-11-24 MED ORDER — ONDANSETRON HCL 4 MG PO TABS
4.0000 mg | ORAL_TABLET | Freq: Four times a day (QID) | ORAL | Status: DC | PRN
Start: 2013-11-24 — End: 2013-11-28

## 2013-11-24 MED ORDER — VANCOMYCIN HCL IN DEXTROSE 1-5 GM/200ML-% IV SOLN
1000.0000 mg | Freq: Once | INTRAVENOUS | Status: DC
  Filled 2013-11-24: qty 200

## 2013-11-24 MED ORDER — LISINOPRIL-HYDROCHLOROTHIAZIDE 10-12.5 MG PO TABS: 1.0000 | ORAL_TABLET | Freq: Every day | ORAL | Status: DC

## 2013-11-24 MED ORDER — MIDAZOLAM HCL 2 MG/2ML IJ SOLN
1.0000 mg | INTRAMUSCULAR | Status: DC | PRN
  Administered 2013-11-24: 0.5 mg via INTRAVENOUS

## 2013-11-24 NOTE — ED Notes (Signed)
Pt was here on 6/24 for back pain. Admitted for lumbar osteomyelitis but was not started on iv antibiotics due to multiple allergies. Pt still having pain, had aspiration of fluid off her back today and sent here for possible admission and iv antibiotics.

## 2013-11-24 NOTE — H&P (Signed)
[facility] History and Physical  Chloe Johnson GYF:[id number] DOB: 09-11-1955 DOA: 11/24/2013   PCP: Lillia Mountain, MD  Specialists: dr Yetta Barre  Chief Complaint: back pain  HPI: Chloe Johnson is a 58 y.o. female with Past medical history of hypertension, neck pain, back pain, asthma, admitted on 6/24 with a discitis per MRI of L3-L4. Aspiration performed and patient discharged on 6/30. It was planned that cultures would be followed- these cultures came back negative. She was having another aspiration today in IR which revealed a 20cc of bloody aspirate. She was recommended by Dr Doristine Locks and Dr Gerlene Fee to go to the ER to be admitted for IV antibiotics.   ROS: pt quite sedated with Fentanyl- difficult to get ROS  Past Medical History  Diagnosis Date  . Hypertension   . Asthma   . Neck pain   . Anxiety    Past Surgical History  Procedure Laterality Date  . Tonsillectomy    . Tubal ligation    . Btl     Social History:  reports that she has never smoked. She has never used smokeless tobacco. She reports that she does not drink alcohol or use illicit drugs. Lives at  Home with husband Needs assistance with ADLs  Allergies  Allergen Reactions  . Erythromycin Nausea And Vomiting    N+V  . Latex Other (See Comments)    Irritation of the skin  . Shellfish Allergy Other (See Comments)    GI- Facial  Acne  . Strawberry Other (See Comments)    Mouth ulcers   . Vicodin [Hydrocodone-Acetaminophen] Other (See Comments)    Restless, tolerate percocet  . Keflex [Cephalexin] Rash  . Penicillins Rash    Hives  . Sulfa Antibiotics Rash    Hives    Family History  Problem Relation Age of Onset  . Other Father   . Hypertension Father       Prior to Admission medications   Medication Sig Start Date End Date Taking? Authorizing Provider  cyclobenzaprine (FLEXERIL) 5 MG tablet Take 10 mg by mouth every 4 (four) hours.    Yes Historical Provider, MD  diazepam (VALIUM) 10 MG  tablet Take 10 mg by mouth every 4 (four) hours as needed. For spasms 11/10/13  Yes Historical Provider, MD  esomeprazole (NEXIUM) 40 MG capsule Take 40 mg by mouth daily as needed.    Yes Historical Provider, MD  EVENING PRIMROSE OIL PO Take 1,000 mg by mouth 2 (two) times daily.   Yes Historical Provider, MD  FLUoxetine (PROZAC) 20 MG capsule Take 20 mg by mouth daily.  10/08/13  Yes Historical Provider, MD  gabapentin (NEURONTIN) 600 MG tablet Take 600 mg by mouth 2 (two) times daily.    Yes Historical Provider, MD  lisinopril-hydrochlorothiazide (PRINZIDE,ZESTORETIC) 10-12.5 MG per tablet Take 1 tablet by mouth daily. 11/07/13  Yes Historical Provider, MD  loratadine (CLARITIN) 10 MG tablet Take 10 mg by mouth daily as needed for allergies.   Yes Historical Provider, MD  morphine (MSIR) 15 MG tablet Take 15 mg by mouth every 4 (four) hours as needed for severe pain.   Yes Historical Provider, MD  Omega-3 Fatty Acids (FISH OIL) 1200 MG CAPS Take 1,200 mg by mouth 2 (two) times daily.   Yes Historical Provider, MD  OVER THE COUNTER MEDICATION Take 1 tablet by mouth daily. "Estrovera"   Yes Historical Provider, MD  OVER THE COUNTER MEDICATION Take 1-3 tablets by mouth daily as needed (for constipation). "Super Cleanse"  Yes Historical Provider, MD  oxyCODONE-acetaminophen (PERCOCET) 5-325 MG per tablet 1 to 2 tablets every 6 hours as needed for pain. 09/11/13  Yes Reuben Likesavid C Keller, MD  potassium chloride SA (K-DUR,KLOR-CON) 20 MEQ tablet Take 20 mEq by mouth daily. 11/11/13  Yes Historical Provider, MD  triamcinolone (NASACORT ALLERGY 24HR) 55 MCG/ACT AERO nasal inhaler Place 1 spray into both nostrils daily as needed (for allergies).   Yes Historical Provider, MD     Physical Exam: Vitals Filed Vitals:   11/24/13 1659 11/24/13 1700 11/24/13 1730 11/24/13 1800  BP: 138/81 138/81 124/76 127/75  Pulse:  84 82 83  Temp:      TempSrc:      Resp: 24 20 21 22   Height:    5\' 1"  (1.549 m)  Weight:     76.4 kg (168 lb 6.9 oz)  SpO2: 100% 96% 98% 100%      General: sleepy but wakeable- some jumbled speech, very slow to respond to questions,no distress HEENT: Normocephalic and Atraumatic, Mucous membranes pink                PERRLA; EOM intact; No scleral icterus,                 Nares: Patent, Oropharynx: Clear, Fair Dentition                 Neck: FROM, no cervical lymphadenopathy, thyromegaly, carotid bruit or JVD;  Breasts: deferred CHEST WALL: No tenderness  CHEST: Normal respiration, clear to auscultation bilaterally  HEART: Regular rate and rhythm; no murmurs rubs or gallops  ABDOMEN: Positive Bowel Sounds, soft, non-tender; no masses, no organomegaly Rectal Exam: deferred EXTREMITIES: No cyanosis, clubbing, or edema Genitalia: not examined  SKIN:  no rash or ulceration  CNS:  Nonfocal exam, CN 2-12 intact  Labs on Admission:  Basic Metabolic Panel:  Recent Labs Lab 11/24/13 1627  NA 136*  K 3.7  CL 94*  CO2 26  GLUCOSE 99  BUN 11  CREATININE 0.76  CALCIUM 9.7   Liver Function Tests: No results found for this basename: AST, ALT, ALKPHOS, BILITOT, PROT, ALBUMIN,  in the last 168 hours No results found for this basename: LIPASE, AMYLASE,  in the last 168 hours No results found for this basename: AMMONIA,  in the last 168 hours CBC:  Recent Labs Lab 11/24/13 1627  WBC 7.4  NEUTROABS 5.0  HGB 11.4*  HCT 35.3*  MCV 86.3  PLT 237   Cardiac Enzymes: No results found for this basename: CKTOTAL, CKMB, CKMBINDEX, TROPONINI,  in the last 168 hours  BNP (last 3 results) No results found for this basename: PROBNP,  in the last 8760 hours CBG: No results found for this basename: GLUCAP,  in the last 168 hours  Radiological Exams on Admission: Dg Fluoro Guide Ndl Plc/bx  11/24/2013   CLINICAL DATA:  L3-L4 are discitis/osteomyelitis.  EXAM: FLUOROSCOPICALLY GUIDED L3-L4 disc space ASPIRATION.  FLUOROSCOPY TIME:  1 min 34 seconds  PROCEDURE: After a thorough  discussion of risks and benefits of the procedure including the general risks of bleeding, infection, injury to nerves, blood vessels, adjacent structures, and allergic reaction, verbal and written consent was obtained. Verbal consent was obtained by Dr. Carlota RaspberryLamke. Specific risks of this procedure included false positive and false negative results and introduction of infection into the disk. Time-out form was completed when appropriate.  The joint was localized under fluoroscopy. A skin entry site LEFT posterior oblique to the joint was chosen  and marked. The skin was prepped and draped in the usual sterile fashion with Betadine soap. Local anesthesia and deep anesthesia was provided with 1% lidocaine without epinephrine. Under fluoroscopic guidance 16 gauge 15 cm diamond point trocar needle was advanced into the joint. Vigorous aspiration was performed which yielded between 25 and 30 cc of bloody fluid, some of which clotted almost immediately. Several passes were made through the disc space and using angulation of the trocar needle, into the area of destroyed endplates. After adequate sample was obtained, the needle was withdrawn to the margin of the disc space. 0.25% Sensorcaine was injected in the 16 gauge needle tract as it was withdrawn for postprocedural anesthesia.  The needle was removed and a sterile dressing applied.  IMPRESSION: Successful fluoroscopically guided aspiration of the L3-L4 disc space using a 16 gauge needle. 25-30 mL of bloody fluid was aspirated, some of which clotted immediately.  I discussed the case with Dr. Valentina Lucks after the procedure by telephone.   Electronically Signed   By: Andreas Newport M.D.   On: 11/24/2013 10:01    Assessment/Plan Principal Problem:   Discitis of lumbar region - will start Vanc and Levaquin due to her multiple allergies- f/u on culture of lumbar aspirate - pain control - add Fentanyl to home dose of Morphine IR-continuous pulse ox while on heavy  narcotics - have consulted ID already - Dr Doristine Locks to take over in AM  Active Problems:   Hypertension -cont Lisinopril / HCTZ with holding parameters- BP may drop from narcotics   NOTE- I have resumed her home medications based upon what her husband has told me   Consulted: ID  Code Status: Full code  Family Communication: with husband     Time spent: 45 min  Lively Haberman, MD [facility]  If 7PM-7AM, please contact night-coverage [email or web address] 11/24/2013, 6:37 PM

## 2013-11-24 NOTE — Progress Notes (Signed)
ANTIBIOTIC CONSULT NOTE - INITIAL  Pharmacy Consult for Levaquin and Vancomycin Indication: discitis  Allergies  Allergen Reactions  . Erythromycin Nausea And Vomiting    N+V  . Latex Other (See Comments)    Irritation of the skin  . Shellfish Allergy Other (See Comments)    GI- Facial  Acne  . Strawberry Other (See Comments)    Mouth ulcers   . Vicodin [Hydrocodone-Acetaminophen] Other (See Comments)    Restless, tolerate percocet  . Keflex [Cephalexin] Rash  . Penicillins Rash    Hives  . Sulfa Antibiotics Rash    Hives    Patient Measurements: Height: 5\' 1"  (154.9 cm) Weight: 168 lb 6.9 oz (76.4 kg) IBW/kg (Calculated) : 47.8  Vital Signs: Temp: 97.5 F (36.4 C) (07/02 1418) Temp src: Oral (07/02 1418) BP: 127/75 mmHg (07/02 1800) Pulse Rate: 83 (07/02 1800) Intake/Output from previous day:   Intake/Output from this shift:    Labs:  Recent Labs  11/24/13 1627  WBC 7.4  HGB 11.4*  PLT 237  CREATININE 0.76   Estimated Creatinine Clearance: 73.4 ml/min (by C-G formula based on Cr of 0.76). No results found for this basename: VANCOTROUGH, Leodis BinetVANCOPEAK, VANCORANDOM, GENTTROUGH, GENTPEAK, GENTRANDOM, TOBRATROUGH, TOBRAPEAK, TOBRARND, AMIKACINPEAK, AMIKACINTROU, AMIKACIN,  in the last 72 hours   Microbiology: Recent Results (from the past 720 hour(s))  URINE CULTURE     Status: None   Collection Time    11/16/13  9:59 PM      Result Value Ref Range Status   Specimen Description URINE, CLEAN CATCH   Final   Special Requests NONE   Final   Culture  Setup Time     Final   Value: 11/17/2013 09:26     Performed at [facility]   Colony Count     Final   Value: 9,000 COLONIES/ML     [facility]   Culture     Final   Value: INSIGNIFICANT GROWTH     Performed at [facility]   Report Status 11/18/2013 FINAL   Final  CULTURE, BLOOD (ROUTINE X 2)     Status: None   Collection Time    11/16/13 11:30 PM      Result Value  Ref Range Status   Specimen Description BLOOD LEFT FOREARM   Final   Special Requests     Final   Value: BOTTLES DRAWN AEROBIC AND ANAEROBIC 10CC BLUE,8CC RED   Culture  Setup Time     Final   Value: 11/17/2013 09:24     Performed at [facility]   Culture     Final   Value: NO GROWTH 5 DAYS     Performed at [facility]   Report Status 11/23/2013 FINAL   Final  CULTURE, BLOOD (ROUTINE X 2)     Status: None   Collection Time    11/16/13 11:46 PM      Result Value Ref Range Status   Specimen Description BLOOD RIGHT ARM   Final   Special Requests BOTTLES DRAWN AEROBIC AND ANAEROBIC 10CC   Final   Culture  Setup Time     Final   Value: 11/17/2013 09:24     Performed at [facility]   Culture     Final   Value: NO GROWTH 5 DAYS     Performed at [facility]   Report Status 11/23/2013 FINAL   Final  CLOSTRIDIUM DIFFICILE BY PCR  Status: None   Collection Time    11/17/13  2:03 AM      Result Value Ref Range Status   C difficile by pcr NEGATIVE  NEGATIVE Final  ANAEROBIC CULTURE     Status: None   Collection Time    11/17/13 10:49 AM      Result Value Ref Range Status   Specimen Description ABSCESS BACK   Final   Special Requests L3 4 DISC   Final   Gram Stain     Final   Value: FEW WBC PRESENT,BOTH PMN AND MONONUCLEAR     NO SQUAMOUS EPITHELIAL CELLS SEEN     NO ORGANISMS SEEN     Performed at [facility]   Culture     Final   Value: NO ANAEROBES ISOLATED     Performed at [facility]   Report Status 11/22/2013 FINAL   Final  CULTURE, ROUTINE-ABSCESS     Status: None   Collection Time    11/17/13 10:49 AM      Result Value Ref Range Status   Specimen Description ABSCESS BACK   Final   Special Requests L3 4 DISC   Final   Gram Stain     Final   Value: FEW WBC PRESENT,BOTH PMN AND MONONUCLEAR     NO SQUAMOUS EPITHELIAL CELLS SEEN     NO ORGANISMS SEEN     Performed at [facility]   Culture     Final   Value:  NO GROWTH 3 DAYS     Performed at [facility]   Report Status 11/20/2013 FINAL   Final  ANAEROBIC CULTURE     Status: None   Collection Time    11/24/13  9:05 AM      Result Value Ref Range Status   Gram Stain No WBC Seen   Preliminary   Gram Stain No Organisms Seen   Preliminary  FUNGUS CULTURE W SMEAR     Status: None   Collection Time    11/24/13  9:05 AM      Result Value Ref Range Status   Preliminary Report Culture in Progress for 4 Weeks   Preliminary   Smear Result No Yeast or Fungal Elements Seen   Preliminary  BODY FLUID CULTURE     Status: None   Collection Time    11/24/13  9:05 AM      Result Value Ref Range Status   Gram Stain No WBC Seen   Preliminary   Gram Stain No Organisms Seen   Preliminary    Medical History: Past Medical History  Diagnosis Date  . Hypertension   . Asthma   . Neck pain   . Anxiety     Medications:  See electronic med rec  Assessment: 58 y.o. female presents after being admitted on 6/24 with a discitis per MRI of L3-L4. Aspiration performed and patient discharged on 6/30. It was planned that cultures would be followed- these cultures came back negative. She was having another aspiration today in IR which revealed a 20cc of bloody aspirate. She was recommended by Dr Doristine Locks and Dr Gerlene Fee to go to the ER to be admitted for IV antibiotics - Levaquin and Vancomycin. Dr. Butler Denmark spoke to ID MD and this is ID recommendations.  Goal of Therapy:  Resolution of infection Vancomycin trough 15-20 mcg/ml  Plan:  1. Levaquin 750mg  IV q24h. 2. Vancomycin 1gm IV q12h. 3. Will f/u renal function, micro data, pt's clinical condition  4. Vanc trough prn  Christoper Fabian, PharmD, BCPS Clinical pharmacist, pager [phone] 11/24/2013,6:16 PM

## 2013-11-24 NOTE — Discharge Instructions (Signed)
Disc Aspiration Post Procedure Discharge Instructions ° °1. May resume a regular diet and any medications that you routinely take (including pain medications). °2. No driving day of procedure. °3. Upon discharge go home and rest for at least 4 hours.  May use an ice pack as needed to injection sites on back. °4. Remove bandades later, today. ° ° ° °Please contact our office at [phone] for the following symptoms: ° °· Fever greater than 100 degrees °· Increased swelling, pain, or redness at injection site. ° ° °Thank you for visiting [facility]. ° °  ° ° ° °    °

## 2013-11-24 NOTE — ED Provider Notes (Signed)
CSN: [id number]     Arrival date & time 11/24/13  1403 History   First MD Initiated Contact with Patient 11/24/13 1526     Chief Complaint  Patient presents with  . Back Pain     (Consider location/radiation/quality/duration/timing/severity/associated sxs/prior Treatment) HPI 58 year old female presents at her PCPs request for admission for IV antibiotics for discitis. She's been having worsening back pain for several months and was diagnosed with L3-L4 discitis. She had an aspiration one week ago. She was admitted, but had negative cultures it was not started on antibiotics. She had another aspiration today with more fluid able to be drawn off. After discussion with her PCP and neurosurgery on call, Dr. Gerlene FeeKritzer, it was advised that she come to the hospital to be admitted for IV antibiotics. The patient states her toes are still numb bilaterally. She's also endorsing some right leg weakness that she's having trouble localizing. Denies any fevers.  Past Medical History  Diagnosis Date  . Hypertension   . Asthma   . Neck pain   . Anxiety    Past Surgical History  Procedure Laterality Date  . Tonsillectomy    . Tubal ligation    . Btl     Family History  Problem Relation Age of Onset  . Other Father   . Hypertension Father    History  Substance Use Topics  . Smoking status: Never Smoker   . Smokeless tobacco: Never Used  . Alcohol Use: No   OB History   Grav Para Term Preterm Abortions TAB SAB Ect Mult Living                 Review of Systems  Constitutional: Negative for fever.  Musculoskeletal: Positive for back pain.  Neurological: Positive for weakness and numbness.  All other systems reviewed and are negative.     Allergies  Erythromycin; Latex; Penicillins; Shellfish allergy; Strawberry; Sulfa antibiotics; and Vicodin  Home Medications   Prior to Admission medications   Medication Sig Start Date End Date Taking? Authorizing Provider  cyclobenzaprine  (FLEXERIL) 5 MG tablet Take 10 mg by mouth 3 (three) times daily as needed for muscle spasms.    Historical Provider, MD  diazepam (VALIUM) 10 MG tablet Take 10 mg by mouth daily as needed. For spasms 11/10/13   Historical Provider, MD  esomeprazole (NEXIUM) 40 MG capsule Take 40 mg by mouth daily at 12 noon.    Historical Provider, MD  EVENING PRIMROSE OIL PO Take 1,000 mg by mouth 2 (two) times daily.    Historical Provider, MD  FLUoxetine (PROZAC) 20 MG capsule Take 20 mg by mouth daily.  10/08/13   Historical Provider, MD  gabapentin (NEURONTIN) 600 MG tablet Take 600 mg by mouth 4 (four) times daily.     Historical Provider, MD  loratadine (CLARITIN) 10 MG tablet Take 10 mg by mouth daily as needed for allergies.    Historical Provider, MD  Omega-3 Fatty Acids (FISH OIL) 1200 MG CAPS Take 1,200 mg by mouth 2 (two) times daily.    Historical Provider, MD  OVER THE COUNTER MEDICATION Take 1 tablet by mouth 2 (two) times daily. "Estrovera"    Historical Provider, MD  OVER THE COUNTER MEDICATION Take 1-3 tablets by mouth daily as needed (for constipation). "Super Cleanse"    Historical Provider, MD  oxyCODONE-acetaminophen (PERCOCET) 5-325 MG per tablet 1 to 2 tablets every 6 hours as needed for pain. 09/11/13   Reuben Likesavid C Keller, MD  triamcinolone (NASACORT ALLERGY  24HR) 55 MCG/ACT AERO nasal inhaler Place 1 spray into both nostrils daily as needed (for allergies).    Historical Provider, MD   BP 130/72  Pulse 89  Temp(Src) 97.5 F (36.4 C) (Oral)  Resp 22  SpO2 96% Physical Exam  Nursing note and vitals reviewed. Constitutional: She is oriented to person, place, and time. She appears well-developed and well-nourished.  HENT:  Head: Normocephalic and atraumatic.  Right Ear: External ear normal.  Left Ear: External ear normal.  Nose: Nose normal.  Eyes: Right eye exhibits no discharge. Left eye exhibits no discharge.  Cardiovascular: Normal rate, regular rhythm and normal heart sounds.    Pulmonary/Chest: Effort normal and breath sounds normal.  Abdominal: Soft. She exhibits no distension.  Neurological: She is alert and oriented to person, place, and time. She displays no Babinski's sign on the right side. She displays no Babinski's sign on the left side.  Reflex Scores:      Patellar reflexes are 0 on the right side and 2+ on the left side.      Achilles reflexes are 2+ on the right side and 2+ on the left side. Mildly limited strength in bilateral lower extremities, appears limited due to pain.  Skin: Skin is warm and dry.    ED Course  Procedures (including critical care time) Labs Review Labs Reviewed  CBC WITH DIFFERENTIAL - Abnormal; Notable for the following:    Hemoglobin 11.4 (*)    HCT 35.3 (*)    All other components within normal limits  BASIC METABOLIC PANEL - Abnormal; Notable for the following:    Sodium 136 (*)    Chloride 94 (*)    Anion gap 16 (*)    All other components within normal limits  SEDIMENTATION RATE - Abnormal; Notable for the following:    Sed Rate 64 (*)    All other components within normal limits  C-REACTIVE PROTEIN  BASIC METABOLIC PANEL  CBC    Imaging Review Dg Fluoro Guide Ndl Plc/bx  11/24/2013   CLINICAL DATA:  L3-L4 are discitis/osteomyelitis.  EXAM: FLUOROSCOPICALLY GUIDED L3-L4 disc space ASPIRATION.  FLUOROSCOPY TIME:  1 min 34 seconds  PROCEDURE: After a thorough discussion of risks and benefits of the procedure including the general risks of bleeding, infection, injury to nerves, blood vessels, adjacent structures, and allergic reaction, verbal and written consent was obtained. Verbal consent was obtained by Dr. Carlota Raspberry. Specific risks of this procedure included false positive and false negative results and introduction of infection into the disk. Time-out form was completed when appropriate.  The joint was localized under fluoroscopy. A skin entry site LEFT posterior oblique to the joint was chosen and marked. The skin  was prepped and draped in the usual sterile fashion with Betadine soap. Local anesthesia and deep anesthesia was provided with 1% lidocaine without epinephrine. Under fluoroscopic guidance 16 gauge 15 cm diamond point trocar needle was advanced into the joint. Vigorous aspiration was performed which yielded between 25 and 30 cc of bloody fluid, some of which clotted almost immediately. Several passes were made through the disc space and using angulation of the trocar needle, into the area of destroyed endplates. After adequate sample was obtained, the needle was withdrawn to the margin of the disc space. 0.25% Sensorcaine was injected in the 16 gauge needle tract as it was withdrawn for postprocedural anesthesia.  The needle was removed and a sterile dressing applied.  IMPRESSION: Successful fluoroscopically guided aspiration of the L3-L4 disc space using a  16 gauge needle. 25-30 mL of bloody fluid was aspirated, some of which clotted immediately.  I discussed the case with Dr. Valentina Lucks after the procedure by telephone.   Electronically Signed   By: Andreas Newport M.D.   On: 11/24/2013 10:01     EKG Interpretation None      MDM   Final diagnoses:  Discitis of lumbar region  Essential hypertension    Patient sent to be admitted. No fevers or concerning new neuro findings at this time. Pain controlled in ED. Will start vanc, and hospitalist will consult ID for other abx given her extensive allergy history    Audree Camel, MD 11/24/13 2108

## 2013-11-25 DIAGNOSIS — G8929 Other chronic pain: Secondary | ICD-10-CM

## 2013-11-25 DIAGNOSIS — M542 Cervicalgia: Secondary | ICD-10-CM

## 2013-11-25 DIAGNOSIS — M549 Dorsalgia, unspecified: Secondary | ICD-10-CM

## 2013-11-25 LAB — CBC
HEMATOCRIT: 32.9 % — AB (ref 36.0–46.0)
Hemoglobin: 10.6 g/dL — ABNORMAL LOW (ref 12.0–15.0)
MCH: 27.9 pg (ref 26.0–34.0)
MCHC: 32.2 g/dL (ref 30.0–36.0)
MCV: 86.6 fL (ref 78.0–100.0)
Platelets: 227 10*3/uL (ref 150–400)
RBC: 3.8 MIL/uL — ABNORMAL LOW (ref 3.87–5.11)
RDW: 13.6 % (ref 11.5–15.5)
WBC: 5.9 10*3/uL (ref 4.0–10.5)

## 2013-11-25 LAB — BASIC METABOLIC PANEL
ANION GAP: 12 (ref 5–15)
BUN: 8 mg/dL (ref 6–23)
CALCIUM: 9.3 mg/dL (ref 8.4–10.5)
CO2: 28 meq/L (ref 19–32)
Chloride: 99 mEq/L (ref 96–112)
Creatinine, Ser: 0.73 mg/dL (ref 0.50–1.10)
GFR calc non Af Amer: >90 mL/min (ref >90)
Glucose, Bld: 106 mg/dL — ABNORMAL HIGH (ref 70–99)
Potassium: 3.8 mEq/L (ref 3.7–5.3)
Sodium: 139 mEq/L (ref 137–147)

## 2013-11-25 MED ORDER — SODIUM CHLORIDE 0.9 % IJ SOLN: 10.0000 mL | INTRAMUSCULAR | Status: DC | PRN

## 2013-11-25 MED ORDER — LEVOFLOXACIN 750 MG PO TABS
750.0000 mg | ORAL_TABLET | Freq: Every day | ORAL | Status: DC
  Administered 2013-11-25 – 2013-11-28 (×4): 750 mg via ORAL
  Filled 2013-11-25 (×4): qty 1

## 2013-11-25 MED ORDER — SODIUM CHLORIDE 0.9 % IJ SOLN
10.0000 mL | Freq: Two times a day (BID) | INTRAMUSCULAR | Status: DC
  Administered 2013-11-28: 10 mL

## 2013-11-25 NOTE — Progress Notes (Signed)
Peripherally Inserted Central Catheter/Midline Placement  The IV Nurse has discussed with the patient and/or persons authorized to consent for the patient, the purpose of this procedure and the potential benefits and risks involved with this procedure.  The benefits include less needle sticks, lab draws from the catheter and patient may be discharged home with the catheter.  Risks include, but not limited to, infection, bleeding, blood clot (thrombus formation), and puncture of an artery; nerve damage and irregular heat beat.  Alternatives to this procedure were also discussed.  PICC/Midline Placement Documentation  PICC / Midline Single Lumen 11/25/13 PICC Right Basilic 37 cm 2 cm (Active)  Indication for Insertion or Continuance of Line Administration of hyperosmolar/irritating solutions (i.e. TPN, Vancomycin, etc.) 11/25/2013  2:20 PM  Line Status Flushed;Saline locked;Blood return noted 11/25/2013  2:20 PM  Dressing Change Due 12/02/13 11/25/2013  2:20 PM       Ethelda Chick 11/25/2013, 2:26 PM

## 2013-11-25 NOTE — Consult Note (Signed)
[place]     Reason for Consult: osteomyelitis/discitis    Referring Physician: Dr. Flavia Shipper  Principal Problem:   Discitis of lumbar region Active Problems:   Hypertension   Discitis   . diazepam  10 mg Oral Q4H  . FLUoxetine  20 mg Oral Daily  . gabapentin  600 mg Oral BID  . hydrochlorothiazide  12.5 mg Oral Daily  . levofloxacin (LEVAQUIN) IV  750 mg Intravenous Q24H  . lisinopril  10 mg Oral Daily  . morphine  15 mg Oral Q4H  . pantoprazole  40 mg Oral Daily  . potassium chloride SA  20 mEq Oral Daily  . senna-docusate  2 tablet Oral QHS  . vancomycin  1,000 mg Intravenous Q12H    Recommendations: I would continue with IV vancomycin and oral levaquin for 6-8 weeks Taper antibiotics based on any new culture Weekly cbc, cmp and antibiotic management per home health protocol  I will order picc  Assessment: MRI from June (report not available) apparently is conscerning for osteomyelitis/discitis.  Aspiration x 2 off of antibiotics has remained negative.  WBCs noted on aspiration last week, not any from yesterday.  Infection is possible.   Baseline CRP 4.1, ESR 64.    Antibiotics: Vancomycin day 2 levaquin day 2  HPI: Chloe Johnson is a 58 y.o. female with chronic pain of neck and back with worsening back pain over several months of lumbar region and MRI done as an outpatient showed L3-L4 discitis.  Had aspiration x 2 and some WBCs noted 1st one, none on yesterdays aspiration done by IR.  No fever, no chills.  Blood culture also remained negative.     Review of Systems: A comprehensive review of systems was negative.  Past Medical History  Diagnosis Date  . Hypertension   . Neck pain   . Anxiety   . Kidney stones     "have always passed them"  . Exertional asthma   . GERD (gastroesophageal reflux disease)   . Discitis of lumbar region 11/16/2013    L3-4/notes 11/24/2013    History  Substance Use Topics  . Smoking status:  Never Smoker   . Smokeless tobacco: Never Used  . Alcohol Use: 3.6 oz/week    3 Glasses of wine, 3 Shots of liquor per week    Family History  Problem Relation Age of Onset  . Other Father   . Hypertension Father    Allergies  Allergen Reactions  . Erythromycin Nausea And Vomiting    N+V  . Latex Other (See Comments)    Irritation of the skin  . Shellfish Allergy Other (See Comments)    GI- Facial  Acne  . Strawberry Other (See Comments)    Mouth ulcers   . Vicodin [Hydrocodone-Acetaminophen] Other (See Comments)    Restless, tolerate percocet  . Keflex [Cephalexin] Rash  . Penicillins Rash    Hives  . Sulfa Antibiotics Rash    Hives    OBJECTIVE: Blood pressure 115/66, pulse 102, temperature 97.9 F (36.6 C), temperature source Oral, resp. rate 18, height _0  (1.549 m), weight 168 lb 6.9 oz (76.4 kg), SpO2 98.00%. General: awake, alert Skin: no rashes Lungs: CTA B Cor: RRR Abdomen: soft, nt, nd Ext: no edema  Microbiology: Recent Results (from the past 240 hour(s))  URINE CULTURE     Status: None   Collection Time    11/16/13  9:59 PM      Result Value Ref  Range Status   Specimen Description URINE, CLEAN CATCH   Final   Special Requests NONE   Final   Culture  Setup Time     Final   Value: 11/17/2013 09:26     Performed at [facility] Count     Final   Value: 9,000 COLONIES/ML     Performed at [facility]   Culture     Final   Value: INSIGNIFICANT GROWTH     Performed at [facility]   Report Status 11/18/2013 FINAL   Final  CULTURE, BLOOD (ROUTINE X 2)     Status: None   Collection Time    11/16/13 11:30 PM      Result Value Ref Range Status   Specimen Description BLOOD LEFT FOREARM   Final   Special Requests     Final   Value: BOTTLES DRAWN AEROBIC AND ANAEROBIC 10CC BLUE,8CC RED   Culture  Setup Time     Final   Value: 11/17/2013 09:24     Performed at [facility]   Culture     Final   Value: NO  GROWTH 5 DAYS     Performed at [facility]   Report Status 11/23/2013 FINAL   Final  CULTURE, BLOOD (ROUTINE X 2)     Status: None   Collection Time    11/16/13 11:46 PM      Result Value Ref Range Status   Specimen Description BLOOD RIGHT ARM   Final   Special Requests BOTTLES DRAWN AEROBIC AND ANAEROBIC 10CC   Final   Culture  Setup Time     Final   Value: 11/17/2013 09:24     Performed at [facility]   Culture     Final   Value: NO GROWTH 5 DAYS     Performed at [facility]   Report Status 11/23/2013 FINAL   Final  CLOSTRIDIUM DIFFICILE BY PCR     Status: None   Collection Time    11/17/13  2:03 AM      Result Value Ref Range Status   C difficile by pcr NEGATIVE  NEGATIVE Final  ANAEROBIC CULTURE     Status: None   Collection Time    11/17/13 10:49 AM      Result Value Ref Range Status   Specimen Description ABSCESS BACK   Final   Special Requests L3 4 DISC   Final   Gram Stain     Final   Value: FEW WBC PRESENT,BOTH PMN AND MONONUCLEAR     NO SQUAMOUS EPITHELIAL CELLS SEEN     NO ORGANISMS SEEN     Performed at [facility]   Culture     Final   Value: NO ANAEROBES ISOLATED     Performed at [facility]   Report Status 11/22/2013 FINAL   Final  CULTURE, ROUTINE-ABSCESS     Status: None   Collection Time    11/17/13 10:49 AM      Result Value Ref Range Status   Specimen Description ABSCESS BACK   Final   Special Requests L3 4 DISC   Final   Gram Stain     Final   Value: FEW WBC PRESENT,BOTH PMN AND MONONUCLEAR     NO SQUAMOUS EPITHELIAL CELLS SEEN     NO ORGANISMS SEEN     Performed at [facility]   Culture     Final   Value:  NO GROWTH 3 DAYS     Performed at [facility]   Report Status 11/20/2013 FINAL   Final  ANAEROBIC CULTURE     Status: None   Collection Time    11/24/13  9:05 AM      Result Value Ref Range Status   Gram Stain No WBC Seen   Preliminary   Gram Stain No Organisms Seen    Preliminary  FUNGUS CULTURE W SMEAR     Status: None   Collection Time    11/24/13  9:05 AM      Result Value Ref Range Status   Preliminary Report Culture in Progress for 4 Weeks   Preliminary   Smear Result No Yeast or Fungal Elements Seen   Preliminary  BODY FLUID CULTURE     Status: None   Collection Time    11/24/13  9:05 AM      Result Value Ref Range Status   Gram Stain No WBC Seen   Preliminary   Gram Stain No Organisms Seen   Preliminary   Preliminary Report NO GROWTH 1 DAY   Preliminary    COMER, [place] [place] [facility] [email or web address] [phone] pager  [phone] cell 11/25/2013, 12:35 PM

## 2013-11-25 NOTE — Progress Notes (Signed)
Subjective: Groggy and some confusion Pain control  Objective: Vital signs in last 24 hours: Temp:  [97.5 F (36.4 C)-97.8 F (36.6 C)] 97.8 F (36.6 C) (07/03 0210) Pulse Rate:  [22-94] 85 (07/03 0210) Resp:  [11-24] 18 (07/03 0210) BP: (103-140)/(57-84) 110/57 mmHg (07/03 0210) SpO2:  [82 %-100 %] 98 % (07/03 0210) Weight:  [76.4 kg (168 lb 6.9 oz)] 76.4 kg (168 lb 6.9 oz) (07/02 1800) Weight change:     Intake/Output from previous day: 07/02 0701 - 07/03 0700 In: -  Out: 500 [Urine:500] Intake/Output this shift:    General appearance: alert Resp: clear to auscultation bilaterally Cardio: regular rate and rhythm  Lab Results:  Recent Labs  11/24/13 1627 11/25/13 0556  WBC 7.4 5.9  HGB 11.4* 10.6*  HCT 35.3* 32.9*  PLT 237 227   BMET  Recent Labs  11/24/13 1627 11/25/13 0556  NA 136* 139  K 3.7 3.8  CL 94* 99  CO2 26 28  GLUCOSE 99 106*  BUN 11 8  CREATININE 0.76 0.73  CALCIUM 9.7 9.3    Studies/Results: Dg Fluoro Guide Ndl Plc/bx  11/24/2013   CLINICAL DATA:  L3-L4 are discitis/osteomyelitis.  EXAM: FLUOROSCOPICALLY GUIDED L3-L4 disc space ASPIRATION.  FLUOROSCOPY TIME:  1 min 34 seconds  PROCEDURE: After a thorough discussion of risks and benefits of the procedure including the general risks of bleeding, infection, injury to nerves, blood vessels, adjacent structures, and allergic reaction, verbal and written consent was obtained. Verbal consent was obtained by Dr. Carlota Raspberry. Specific risks of this procedure included false positive and false negative results and introduction of infection into the disk. Time-out form was completed when appropriate.  The joint was localized under fluoroscopy. A skin entry site LEFT posterior oblique to the joint was chosen and marked. The skin was prepped and draped in the usual sterile fashion with Betadine soap. Local anesthesia and deep anesthesia was provided with 1% lidocaine without epinephrine. Under fluoroscopic guidance  16 gauge 15 cm diamond point trocar needle was advanced into the joint. Vigorous aspiration was performed which yielded between 25 and 30 cc of bloody fluid, some of which clotted almost immediately. Several passes were made through the disc space and using angulation of the trocar needle, into the area of destroyed endplates. After adequate sample was obtained, the needle was withdrawn to the margin of the disc space. 0.25% Sensorcaine was injected in the 16 gauge needle tract as it was withdrawn for postprocedural anesthesia.  The needle was removed and a sterile dressing applied.  IMPRESSION: Successful fluoroscopically guided aspiration of the L3-L4 disc space using a 16 gauge needle. 25-30 mL of bloody fluid was aspirated, some of which clotted immediately.  I discussed the case with Dr. Valentina Lucks after the procedure by telephone.   Electronically Signed   By: Andreas Newport M.D.   On: 11/24/2013 10:01    Medications: I have reviewed the patient's current medications.  Assessment/Plan: Principal Problem:  Discitis of lumbar region -L4- s/p Aspiration yesterday- so far no organism- await culture ID input will be helpful Neurosurgery consult- Dr Yetta Barre (outside Neurosurgeon)-  ? Repeat MRI again- last in 11/17/13 - continue Vanc and Levaquin due to her multiple allergies- f/u on culture of lumbar aspirate  - pain control - on Fentanyl prn;  Morphine IR-continuous pulse ox while on heavy narcotics  Confusion- multiple drug cocktail- d/c flexeril; narcotic control- due to groggy and confusion HTN- Control   LOS: 1 day   Rox Mcgriff 11/25/2013, 7:57  AM

## 2013-11-25 NOTE — Progress Notes (Signed)
Utilization review completed. Ordean Fouts, RN, BSN. 

## 2013-11-26 LAB — VANCOMYCIN, TROUGH: VANCOMYCIN TR: 14 ug/mL (ref 10.0–20.0)

## 2013-11-26 LAB — GLUCOSE, CAPILLARY: GLUCOSE-CAPILLARY: 110 mg/dL — AB (ref 70–99)

## 2013-11-26 MED ORDER — SODIUM CHLORIDE 0.9 % IV BOLUS (SEPSIS)
250.0000 mL | Freq: Once | INTRAVENOUS | Status: AC
  Administered 2013-11-27: 250 mL via INTRAVENOUS

## 2013-11-26 MED ORDER — VANCOMYCIN HCL 10 G IV SOLR
1250.0000 mg | Freq: Two times a day (BID) | INTRAVENOUS | Status: DC
  Administered 2013-11-26 – 2013-11-28 (×4): 1250 mg via INTRAVENOUS
  Filled 2013-11-26 (×5): qty 1250

## 2013-11-26 MED ORDER — MORPHINE SULFATE 15 MG PO TABS
15.0000 mg | ORAL_TABLET | Freq: Four times a day (QID) | ORAL | Status: DC | PRN
  Administered 2013-11-26 – 2013-11-27 (×3): 15 mg via ORAL
  Filled 2013-11-26 (×4): qty 1

## 2013-11-26 MED ORDER — SODIUM CHLORIDE 0.9 % IV SOLN
INTRAVENOUS | Status: AC
  Administered 2013-11-27: via INTRAVENOUS

## 2013-11-26 MED ORDER — DIAZEPAM 5 MG PO TABS
10.0000 mg | ORAL_TABLET | Freq: Four times a day (QID) | ORAL | Status: DC | PRN
  Administered 2013-11-26: 10 mg via ORAL
  Filled 2013-11-26: qty 2

## 2013-11-26 MED ORDER — DIAZEPAM 5 MG PO TABS: 5.0000 mg | ORAL_TABLET | Freq: Four times a day (QID) | ORAL | Status: DC | PRN

## 2013-11-26 NOTE — Progress Notes (Signed)
Patient is becoming more disoriented after the use of the valium then the [facility]; spoke with Dr.Gates; both medications will be changed to prn only and reduce the dose of the valium to 5mg .  Patient and family in agreement; await PT eval for help with unsteady gait; VSS; no acute distress; patient reports the pain radiates all over; both hips and down into her legs with mobility and sitting; crossing her legs when she walks to the bathroom; tolerated the chair briefly today;remains a very high fall risk; denies dizziness; she reports seeing spots and talks off topic at times; "drowsy" and "talking off topic"; reorients with staff and family when spoken to and reminded.

## 2013-11-26 NOTE — Progress Notes (Signed)
ANTIBIOTIC CONSULT NOTE - INITIAL  Pharmacy Consult for Levaquin and Vancomycin Indication: discitis  Allergies  Allergen Reactions  . Erythromycin Nausea And Vomiting    N+V  . Latex Other (See Comments)    Irritation of the skin  . Shellfish Allergy Other (See Comments)    GI- Facial  Acne  . Strawberry Other (See Comments)    Mouth ulcers   . Vicodin [Hydrocodone-Acetaminophen] Other (See Comments)    Restless, tolerate percocet  . Keflex [Cephalexin] Rash  . Penicillins Rash    Hives  . Sulfa Antibiotics Rash    Hives    Patient Measurements: Height: 5\' 1"  (154.9 cm) Weight: 168 lb 6.9 oz (76.4 kg) IBW/kg (Calculated) : 47.8  Vital Signs: Temp: 97.6 F (36.4 C) (07/04 2035) Temp src: Oral (07/04 2035) BP: 93/53 mmHg (07/04 2039) Pulse Rate: 79 (07/04 2035) Intake/Output from previous day: 07/03 0701 - 07/04 0700 In: 600 [P.O.:600] Out: -  Intake/Output from this shift:    Labs:  Recent Labs  11/24/13 1627 11/25/13 0556  WBC 7.4 5.9  HGB 11.4* 10.6*  PLT 237 227  CREATININE 0.76 0.73   Estimated Creatinine Clearance: 73.4 ml/min (by C-G formula based on Cr of 0.73).  Recent Labs  11/26/13 1905  VANCOTROUGH 14.0     Microbiology: Recent Results (from the past 720 hour(s))  URINE CULTURE     Status: None   Collection Time    11/16/13  9:59 PM      Result Value Ref Range Status   Specimen Description URINE, CLEAN CATCH   Final   Special Requests NONE   Final   Culture  Setup Time     Final   Value: 11/17/2013 09:26     Performed at [facility] Count     Final   Value: 9,000 COLONIES/ML     Performed at [facility]   Culture     Final   Value: INSIGNIFICANT GROWTH     Performed at [facility]   Report Status 11/18/2013 FINAL   Final  CULTURE, BLOOD (ROUTINE X 2)     Status: None   Collection Time    11/16/13 11:30 PM      Result Value Ref Range Status   Specimen Description BLOOD LEFT FOREARM    Final   Special Requests     Final   Value: BOTTLES DRAWN AEROBIC AND ANAEROBIC 10CC BLUE,8CC RED   Culture  Setup Time     Final   Value: 11/17/2013 09:24     Performed at [facility]   Culture     Final   Value: NO GROWTH 5 DAYS     Performed at [facility]   Report Status 11/23/2013 FINAL   Final  CULTURE, BLOOD (ROUTINE X 2)     Status: None   Collection Time    11/16/13 11:46 PM      Result Value Ref Range Status   Specimen Description BLOOD RIGHT ARM   Final   Special Requests BOTTLES DRAWN AEROBIC AND ANAEROBIC 10CC   Final   Culture  Setup Time     Final   Value: 11/17/2013 09:24     Performed at [facility]   Culture     Final   Value: NO GROWTH 5 DAYS     Performed at [facility]   Report Status 11/23/2013 FINAL   Final  CLOSTRIDIUM DIFFICILE BY PCR  Status: None   Collection Time    11/17/13  2:03 AM      Result Value Ref Range Status   C difficile by pcr NEGATIVE  NEGATIVE Final  ANAEROBIC CULTURE     Status: None   Collection Time    11/17/13 10:49 AM      Result Value Ref Range Status   Specimen Description ABSCESS BACK   Final   Special Requests L3 4 DISC   Final   Gram Stain     Final   Value: FEW WBC PRESENT,BOTH PMN AND MONONUCLEAR     NO SQUAMOUS EPITHELIAL CELLS SEEN     NO ORGANISMS SEEN     Performed at [facility]   Culture     Final   Value: NO ANAEROBES ISOLATED     Performed at [facility]   Report Status 11/22/2013 FINAL   Final  CULTURE, ROUTINE-ABSCESS     Status: None   Collection Time    11/17/13 10:49 AM      Result Value Ref Range Status   Specimen Description ABSCESS BACK   Final   Special Requests L3 4 DISC   Final   Gram Stain     Final   Value: FEW WBC PRESENT,BOTH PMN AND MONONUCLEAR     NO SQUAMOUS EPITHELIAL CELLS SEEN     NO ORGANISMS SEEN     Performed at [facility]   Culture     Final   Value: NO GROWTH 3 DAYS     Performed at [facility]    Report Status 11/20/2013 FINAL   Final  AFB CULTURE WITH SMEAR     Status: None   Collection Time    11/24/13  9:05 AM      Result Value Ref Range Status   Preliminary Report Culture will be examined for 6 weeks before    Preliminary   Preliminary Report issuing a Final Report.   Preliminary   Acid Fast Smear No Acid Fast Bacilli Seen   Preliminary  ANAEROBIC CULTURE     Status: None   Collection Time    11/24/13  9:05 AM      Result Value Ref Range Status   Gram Stain No WBC Seen   Preliminary   Gram Stain No Organisms Seen   Preliminary   Preliminary Report No Anaerobes Isolated; Culture in Progress for    Preliminary   Preliminary Report 5 Days   Preliminary  FUNGUS CULTURE W SMEAR     Status: None   Collection Time    11/24/13  9:05 AM      Result Value Ref Range Status   Preliminary Report Culture in Progress for 4 Weeks   Preliminary   Smear Result No Yeast or Fungal Elements Seen   Preliminary  BODY FLUID CULTURE     Status: None   Collection Time    11/24/13  9:05 AM      Result Value Ref Range Status   Gram Stain No WBC Seen   Preliminary   Gram Stain No Organisms Seen   Preliminary   Preliminary Report NO GROWTH 2 DAYS   Preliminary    Medical History: Past Medical History  Diagnosis Date  . Hypertension   . Neck pain   . Anxiety   . Kidney stones     "have always passed them"  . Exertional asthma   . GERD (gastroesophageal reflux disease)   . Discitis of lumbar  region 11/16/2013    L3-4/notes 11/24/2013    Medications:  See electronic med rec  Assessment: Steady state vancomycin trough = 14 mcg/ml on 1 gm IV q12h in this 58 y.o. Female on day #3 vancomycin and levofloxacin for discitis per MRI of L3-L4.   Goal of Therapy:  Resolution of infection Vancomycin trough 15-20 mcg/ml  Plan:  Increase Vancomycin to 1250 mg IV q12h. Levaquin 750mg  IV q24h. Will f/u renal function, micro data, pt's clinical condition and Vanc trough prn  Noah Delaineuth Nastassja Witkop,  RPh Clinical Pharmacist Pager: [phone] 11/26/2013,8:42 PM

## 2013-11-26 NOTE — Progress Notes (Signed)
[name] for Infectious Disease  Date of Admission:  11/24/2013  Antibiotics: Vancomycin and levaquin day 3  Subjective: Feels better, less pain  Objective: Temp:  [97.4 F (36.3 C)-97.9 F (36.6 C)] 97.6 F (36.4 C) (07/04 1045) Pulse Rate:  [73-87] 76 (07/04 1045) Resp:  [18-20] 18 (07/04 1045) BP: (116-139)/(52-81) 117/70 mmHg (07/04 1045) SpO2:  [92 %-99 %] 92 % (07/04 1045)  General: awake, more alert Skin: no rashes Lungs: CTA B Cor: RRR Abdomen: soft, nt, nt Ext: no edema  Lab Results Lab Results  Component Value Date   WBC 5.9 11/25/2013   HGB 10.6* 11/25/2013   HCT 32.9* 11/25/2013   MCV 86.6 11/25/2013   PLT 227 11/25/2013    Lab Results  Component Value Date   CREATININE 0.73 11/25/2013   BUN 8 11/25/2013   NA 139 11/25/2013   K 3.8 11/25/2013   CL 99 11/25/2013   CO2 28 11/25/2013    Lab Results  Component Value Date   ALT 24 11/17/2013   AST 16 11/17/2013   ALKPHOS 97 11/17/2013   BILITOT <0.2* 11/17/2013      Microbiology: Recent Results (from the past 240 hour(s))  URINE CULTURE     Status: None   Collection Time    11/16/13  9:59 PM      Result Value Ref Range Status   Specimen Description URINE, CLEAN CATCH   Final   Special Requests NONE   Final   Culture  Setup Time     Final   Value: 11/17/2013 09:26     Performed at [place]     Final   Value: 9,000 COLONIES/ML     Performed at [facility]   Culture     Final   Value: INSIGNIFICANT GROWTH     Performed at [facility]   Report Status 11/18/2013 FINAL   Final  CULTURE, BLOOD (ROUTINE X 2)     Status: None   Collection Time    11/16/13 11:30 PM      Result Value Ref Range Status   Specimen Description BLOOD LEFT FOREARM   Final   Special Requests     Final   Value: BOTTLES DRAWN AEROBIC AND ANAEROBIC 10CC BLUE,8CC RED   Culture  Setup Time     Final   Value: 11/17/2013 09:24     Performed at [facility]   Culture     Final   Value:  NO GROWTH 5 DAYS     Performed at [facility]   Report Status 11/23/2013 FINAL   Final  CULTURE, BLOOD (ROUTINE X 2)     Status: None   Collection Time    11/16/13 11:46 PM      Result Value Ref Range Status   Specimen Description BLOOD RIGHT ARM   Final   Special Requests BOTTLES DRAWN AEROBIC AND ANAEROBIC 10CC   Final   Culture  Setup Time     Final   Value: 11/17/2013 09:24     Performed at [facility]   Culture     Final   Value: NO GROWTH 5 DAYS     Performed at [facility]   Report Status 11/23/2013 FINAL   Final  CLOSTRIDIUM DIFFICILE BY PCR     Status: None   Collection Time    11/17/13  2:03 AM      Result Value Ref Range Status  C difficile by pcr NEGATIVE  NEGATIVE Final  ANAEROBIC CULTURE     Status: None   Collection Time    11/17/13 10:49 AM      Result Value Ref Range Status   Specimen Description ABSCESS BACK   Final   Special Requests L3 4 DISC   Final   Gram Stain     Final   Value: FEW WBC PRESENT,BOTH PMN AND MONONUCLEAR     NO SQUAMOUS EPITHELIAL CELLS SEEN     NO ORGANISMS SEEN     Performed at [facility]   Culture     Final   Value: NO ANAEROBES ISOLATED     Performed at [facility]   Report Status 11/22/2013 FINAL   Final  CULTURE, ROUTINE-ABSCESS     Status: None   Collection Time    11/17/13 10:49 AM      Result Value Ref Range Status   Specimen Description ABSCESS BACK   Final   Special Requests L3 4 DISC   Final   Gram Stain     Final   Value: FEW WBC PRESENT,BOTH PMN AND MONONUCLEAR     NO SQUAMOUS EPITHELIAL CELLS SEEN     NO ORGANISMS SEEN     Performed at [facility]   Culture     Final   Value: NO GROWTH 3 DAYS     Performed at [facility]   Report Status 11/20/2013 FINAL   Final  AFB CULTURE WITH SMEAR     Status: None   Collection Time    11/24/13  9:05 AM      Result Value Ref Range Status   Preliminary Report Culture will be examined for 6 weeks before     Preliminary   Preliminary Report issuing a Final Report.   Preliminary   Acid Fast Smear No Acid Fast Bacilli Seen   Preliminary  ANAEROBIC CULTURE     Status: None   Collection Time    11/24/13  9:05 AM      Result Value Ref Range Status   Gram Stain No WBC Seen   Preliminary   Gram Stain No Organisms Seen   Preliminary   Preliminary Report No Anaerobes Isolated; Culture in Progress for    Preliminary   Preliminary Report 5 Days   Preliminary  FUNGUS CULTURE W SMEAR     Status: None   Collection Time    11/24/13  9:05 AM      Result Value Ref Range Status   Preliminary Report Culture in Progress for 4 Weeks   Preliminary   Smear Result No Yeast or Fungal Elements Seen   Preliminary  BODY FLUID CULTURE     Status: None   Collection Time    11/24/13  9:05 AM      Result Value Ref Range Status   Gram Stain No WBC Seen   Preliminary   Gram Stain No Organisms Seen   Preliminary   Preliminary Report NO GROWTH 2 DAYS   Preliminary    Studies/Results: No results found.  Assessment/Plan: 1) possible osteomyelitis/discitis - cultures remain negative.  Will continue with IV vancomycin and oral levaquin for at least 6 weeks and monitor clinical improvement.  Baseline CRP 4.1, ESR 64 Weekly cbc, cmp to [facility] Antibiotics per home health protocol We will arrange follow up in [facility] in about 2 weeks  Thanks for consult, will sign off  Shanel Prazak, Herbie [place], [place] for Infectious Disease [facility] [email or web address] [phone] pager   [phone] cell 11/26/2013, 2:04 PM

## 2013-11-26 NOTE — Progress Notes (Signed)
Subjective: Patient arousable and conversive but speech is slow and slightly slurred.  She is on Ativan 2 milligrams every 4 hours and she only takes this when necessary at home for muscle spasm.  Back pain is improved.  To taper Ativan and change to when necessary.  Plan will be for home IV therapy as per infectious disease  Objective: Weight change:   Intake/Output Summary (Last 24 hours) at 11/26/13 0942 Last data filed at 11/25/13 1800  Gross per 24 hour  Intake    360 ml  Output      0 ml  Net    360 ml   Filed Vitals:   11/25/13 1800 11/25/13 2021 11/26/13 0100 11/26/13 0700  BP: 139/77 125/81 118/78 117/70  Pulse: 81 87 76 73  Temp: 97.7 F (36.5 C) 97.4 F (36.3 C) 97.9 F (36.6 C) 97.5 F (36.4 C)  TempSrc: Oral Oral Oral Oral  Resp: _0 Height:      Weight:      SpO2: 93% 92% 99% 95%    General Appearance: Patient is lethargic and groggy and speech is somewhat slurred.  On Valium 10 milligrams Q4 hours around-the-clock Lungs: Clear to auscultation bilaterally, respirations unlabored Heart: Regular rate and rhythm, S1 and S2 normal, no murmur, rub or gallop Extremities: Extremities normal, atraumatic, no cyanosis or edema Neuro: Speech slightly slurred, nonfocal  Lab Results: Results for orders placed during the hospital encounter of 11/24/13 (from the past 48 hour(s))  CBC WITH DIFFERENTIAL     Status: Abnormal   Collection Time    11/24/13  4:27 PM      Result Value Ref Range   WBC 7.4  4.0 - 10.5 K/uL   RBC 4.09  3.87 - 5.11 MIL/uL   Hemoglobin 11.4 (*) 12.0 - 15.0 g/dL   HCT 35.3 (*) 36.0 - 46.0 %   MCV 86.3  78.0 - 100.0 fL   MCH 27.9  26.0 - 34.0 pg   MCHC 32.3  30.0 - 36.0 g/dL   RDW 13.5  11.5 - 15.5 %   Platelets 237  150 - 400 K/uL   Neutrophils Relative % 67  43 - 77 %   Neutro Abs 5.0  1.7 - 7.7 K/uL   Lymphocytes Relative 22  12 - 46 %   Lymphs Abs 1.6  0.7 - 4.0 K/uL   Monocytes Relative 8  3 - 12 %   Monocytes Absolute 0.6  0.1 -  1.0 K/uL   Eosinophils Relative 3  0 - 5 %   Eosinophils Absolute 0.2  0.0 - 0.7 K/uL   Basophils Relative 0  0 - 1 %   Basophils Absolute 0.0  0.0 - 0.1 K/uL  BASIC METABOLIC PANEL     Status: Abnormal   Collection Time    11/24/13  4:27 PM      Result Value Ref Range   Sodium 136 (*) 137 - 147 mEq/L   Potassium 3.7  3.7 - 5.3 mEq/L   Chloride 94 (*) 96 - 112 mEq/L   CO2 26  19 - 32 mEq/L   Glucose, Bld 99  70 - 99 mg/dL   BUN 11  6 - 23 mg/dL   Creatinine, Ser 0.76  0.50 - 1.10 mg/dL   Calcium 9.7  8.4 - 10.5 mg/dL   GFR calc non Af Amer >90  >90 mL/min   GFR calc Af Amer >90  >90 mL/min   Comment: (NOTE)  The eGFR has been calculated using the CKD EPI equation.     This calculation has not been validated in all clinical situations.     eGFR's persistently <90 mL/min signify possible Chronic Kidney     Disease.   Anion gap 16 (*) 5 - 15  SEDIMENTATION RATE     Status: Abnormal   Collection Time    11/24/13  4:27 PM      Result Value Ref Range   Sed Rate 64 (*) 0 - 22 mm/hr  C-REACTIVE PROTEIN     Status: Abnormal   Collection Time    11/24/13  4:27 PM      Result Value Ref Range   CRP 4.1 (*) <0.60 mg/dL   Comment: Performed at [facility]     Status: Abnormal   Collection Time    11/25/13  5:56 AM      Result Value Ref Range   Sodium 139  137 - 147 mEq/L   Potassium 3.8  3.7 - 5.3 mEq/L   Chloride 99  96 - 112 mEq/L   CO2 28  19 - 32 mEq/L   Glucose, Bld 106 (*) 70 - 99 mg/dL   BUN 8  6 - 23 mg/dL   Creatinine, Ser 0.73  0.50 - 1.10 mg/dL   Calcium 9.3  8.4 - 10.5 mg/dL   GFR calc non Af Amer >90  >90 mL/min   GFR calc Af Amer >90  >90 mL/min   Comment: (NOTE)     The eGFR has been calculated using the CKD EPI equation.     This calculation has not been validated in all clinical situations.     eGFR's persistently <90 mL/min signify possible Chronic Kidney     Disease.   Anion gap 12  5 - 15  CBC     Status: Abnormal    Collection Time    11/25/13  5:56 AM      Result Value Ref Range   WBC 5.9  4.0 - 10.5 K/uL   RBC 3.80 (*) 3.87 - 5.11 MIL/uL   Hemoglobin 10.6 (*) 12.0 - 15.0 g/dL   HCT 32.9 (*) 36.0 - 46.0 %   MCV 86.6  78.0 - 100.0 fL   MCH 27.9  26.0 - 34.0 pg   MCHC 32.2  30.0 - 36.0 g/dL   RDW 13.6  11.5 - 15.5 %   Platelets 227  150 - 400 K/uL    Studies/Results: No results found. Medications: Scheduled Meds: . FLUoxetine  20 mg Oral Daily  . gabapentin  600 mg Oral BID  . hydrochlorothiazide  12.5 mg Oral Daily  . levofloxacin  750 mg Oral Daily  . lisinopril  10 mg Oral Daily  . morphine  15 mg Oral Q4H  . pantoprazole  40 mg Oral Daily  . potassium chloride SA  20 mEq Oral Daily  . senna-docusate  2 tablet Oral QHS  . sodium chloride  10-40 mL Intracatheter Q12H  . vancomycin  1,000 mg Intravenous Q12H   Continuous Infusions:  PRN Meds:.alum & mag hydroxide-simeth, diazepam, fentaNYL, ondansetron (ZOFRAN) IV, ondansetron, polyethylene glycol, sodium chloride  Assessment/Plan: Principal Problem:   Discitis of lumbar region - continue IV vancomycin and oral Levaquin.  Need to make plans for outpatient IV therapy Active Problems:   Hypertension   Discitis   Pain control - adjust pain meds to not have her sedate   Sedation - change Valium to  every 6 hours when necessary    LOS: 2 days   Henrine Screws, MD 11/26/2013, 9:42 AM

## 2013-11-27 LAB — BODY FLUID CULTURE
GRAM STAIN: NONE SEEN
Gram Stain: NONE SEEN
Organism ID, Bacteria: NO GROWTH

## 2013-11-27 MED ORDER — DIAZEPAM 5 MG PO TABS: 5.0000 mg | ORAL_TABLET | Freq: Two times a day (BID) | ORAL | Status: DC | PRN

## 2013-11-27 MED ORDER — CYCLOBENZAPRINE HCL 10 MG PO TABS
10.0000 mg | ORAL_TABLET | Freq: Three times a day (TID) | ORAL | Status: DC | PRN
  Administered 2013-11-28: 10 mg via ORAL
  Filled 2013-11-27: qty 1

## 2013-11-27 MED ORDER — KETOROLAC TROMETHAMINE 30 MG/ML IJ SOLN
30.0000 mg | Freq: Three times a day (TID) | INTRAMUSCULAR | Status: DC | PRN
  Administered 2013-11-27 – 2013-11-28 (×4): 30 mg via INTRAVENOUS
  Filled 2013-11-27 (×4): qty 1

## 2013-11-27 NOTE — Progress Notes (Signed)
Pt alert and oriented x4. Fatigued but less drowsy than earlier. BP 106/60.

## 2013-11-27 NOTE — Progress Notes (Signed)
Patient has been sleeping past hour; BP low; see graphics; got up urgently to the bathroom; incontinent of urine and stool; BP rechecked improved with mobility; family still very concerned with patient's altered mental status; talking off topic; poor safety awareness; remains a high fall risk.

## 2013-11-27 NOTE — Progress Notes (Addendum)
Subjective: Chloe Johnson is more alert today.  She dropped her blood pressure somewhat last night and responded nicely to a normal saline bolus.  Patient able to move all extremities well and she is conversing normally.  We had a long conversation about what medication she's been on over the past few weeks.  Family real concern about her being on Valium and believes it to be too sedating for her.  She likes to have it in backup and thinks that Flexeril works well and is less sedating for muscle spasms.  Generally, her pain may be improving which may allow her to reduce medications for pain and muscle spasms.  Prior to the hospitalization she did have a couple of bowel movements with some blood and some concern for nonsteroidal use causing that was reason to stop nonsteroidal anti-inflammatory medications.  Goal is to stay away from steroid injections in the face of possible disc infection.  Appreciate ID input and we'll make plans for home health to assist with IV vancomycin at home  Objective: Weight change:   Intake/Output Summary (Last 24 hours) at 11/27/13 0846 Last data filed at 11/27/13 0500  Gross per 24 hour  Intake    120 ml  Output      1 ml  Net    119 ml   Filed Vitals:   11/26/13 2039 11/26/13 2122 11/27/13 0129 11/27/13 0527  BP: 93/53 91/54 106/60 110/67  Pulse:   73 68  Temp:   97.4 F (36.3 C) 97.6 F (36.4 C)  TempSrc:   Oral Oral  Resp:   18 18  Height:      Weight:      SpO2:   99% 100%    General Appearance: Alert, cooperative, no distress, complains of achiness in both buttocks Lungs: Clear to auscultation bilaterally, respirations unlabored Heart: Regular rate and rhythm, S1 and S2 normal, no murmur, rub or gallop Abdomen: Soft, non-tender, bowel sounds active all four quadrants, no masses, no organomegaly Extremities: Extremities normal, atraumatic, no cyanosis or edema.  Unable to flex thighs bilaterally of the bed but can contract her quadriceps bilaterally  well Neuro: Alert, conversive, and moves all extremities well  Lab Results: Results for orders placed during the hospital encounter of 11/24/13 (from the past 48 hour(s))  VANCOMYCIN, TROUGH     Status: None   Collection Time    11/26/13  7:05 PM      Result Value Ref Range   Vancomycin Tr 14.0  10.0 - 20.0 ug/mL  GLUCOSE, CAPILLARY     Status: Abnormal   Collection Time    11/26/13 11:55 PM      Result Value Ref Range   Glucose-Capillary 110 (*) 70 - 99 mg/dL   Comment 1 Documented in Chart     Comment 2 Notify RN      Studies/Results: No results found. Medications: Scheduled Meds: . FLUoxetine  20 mg Oral Daily  . gabapentin  600 mg Oral BID  . hydrochlorothiazide  12.5 mg Oral Daily  . levofloxacin  750 mg Oral Daily  . lisinopril  10 mg Oral Daily  . pantoprazole  40 mg Oral Daily  . potassium chloride SA  20 mEq Oral Daily  . senna-docusate  2 tablet Oral QHS  . sodium chloride  10-40 mL Intracatheter Q12H  . vancomycin  1,250 mg Intravenous Q12H   Continuous Infusions: . sodium chloride 70 mL/hr at 11/27/13 0008   PRN Meds:.alum & mag hydroxide-simeth, diazepam, fentaNYL, morphine,  ondansetron (ZOFRAN) IV, ondansetron, polyethylene glycol, sodium chloride  Assessment/Plan: Principal Problem:   Discitis of lumbar region - continue IV vancomycin for 6 weeks and also oral Levaquin for 6 weeks as per infectious disease.  She is to followup with infectious disease as an outpatient in 2 weeks Active Problems:   Hypertension - controlled   Discitis - as above   Hypotension - likely secondary to sedative medications which were reduced   Oversedation - better and will reduce Valium to 5 milligrams every 12 hours when necessary if Flexeril is not helpful.     Muscle spasm - Flexeril 10 milligrams every 8 hours when necessary   Pain control - try IV Toradol which has worked in the past and check renal function in a.m. and CBC.  Discontinue fentanyl.  Morphine sulfate                          for breakthrough pain orally   Disposition - hopefully home with home health tomorrow - consult ordered   Physical therapy - to try to ambulate with walker   LOS: 3 days   Chloe Dubonnetobert Nevill Xayvier Vallez, MD 11/27/2013, 8:46 AM

## 2013-11-27 NOTE — Progress Notes (Signed)
Pt continues to be confused and lethargic. BP lowered to 91/54. MD notified. bolus of NS given and rate increased from 20 to 91ml/hr per orders.

## 2013-11-27 NOTE — Evaluation (Signed)
Physical Therapy Evaluation Patient Details Name: Chloe Johnson MRN: [id number] DOB: [birth year] Today's Date: 11/27/2013   History of Present Illness  58 y.o. female with Past medical history of hypertension, neck pain, back pain, asthma, admitted on 6/24 with a discitis per MRI of L3-L4. Aspiration performed and patient discharged on 6/30. It was planned that cultures would be followed- these cultures came back negative. She was having another aspiration today in IR which revealed a 20cc of bloody aspirate.  possible osteomyelitis/discitis - cultures remain negative. infectious disease MD signing off. neuro consult pending.   Clinical Impression  Pt adm from home due to above. Presents with decreased independence with functional mobility due to inconsistent functional LE strength and back pain. Pt very confused and going off into random conversations not pertaining to therapy session today. Daughter and RN believe the confusion to be due to medication. Pt to benefit from skilled acute PT to address deficits listed below and maximize functional mobility prior to D/C home. Neurology is planned to consult tomorrow per RN regarding course of treatment.     Follow Up Recommendations Home health PT;Supervision/Assistance - 24 hour    Equipment Recommendations  Rolling walker with 5" wheels    Recommendations for Other Services OT consult     Precautions / Restrictions Precautions Precautions: Back;Fall Precaution Comments: educated on log rolling technique to reduce stress on spine; daughter reports pt has fallen multiple times at home Restrictions Weight Bearing Restrictions: No      Mobility  Bed Mobility Overal bed mobility: Needs Assistance Bed Mobility: Rolling;Sidelying to Sit Rolling: Supervision Sidelying to sit: Min guard       General bed mobility comments: cues and min guard to facilitate Log rolling technique; incr time due to pain  Transfers Overall transfer level: Needs  assistance Equipment used: Rolling walker (2 wheeled) Transfers: Sit to/from Stand Sit to Stand: Min guard         General transfer comment: min guard to steady due to pain; cues for hand placement and safety with RW   Ambulation/Gait Ambulation/Gait assistance: Min assist Ambulation Distance (Feet): 50 Feet Assistive device: Rolling walker (2 wheeled) Gait Pattern/deviations: Step-through pattern;Decreased stride length;Decreased dorsiflexion - left;Decreased dorsiflexion - right;Shuffle Gait velocity: decreased Gait velocity interpretation: Below normal speed for age/gender General Gait Details: pt with difficulty clearing LEs at times and ambulates with shuffled/scissor gt at times; cues for widen BOS and upright posture; (A) to maintain balance and manage RW ; pt inconsistent with c/o pain during gt  Stairs            Wheelchair Mobility    Modified Rankin (Stroke Patients Only)       Balance Overall balance assessment: Needs assistance;History of Falls Sitting-balance support: Feet supported;No upper extremity supported Sitting balance-Leahy Scale: Fair Sitting balance - Comments: denies any dizziness with sitting EOB   Standing balance support: During functional activity;Bilateral upper extremity supported Standing balance-Leahy Scale: Poor Standing balance comment: requires min (A) and bil UE support for safety                             Pertinent Vitals/Pain Denies pain during ambulation; c/o 9/10 pain at rest. RN aware. BP 112/76     Home Living Family/patient expects to be discharged to:: Private residence Living Arrangements: Spouse/significant other;Children Available Help at Discharge: Family;Available 24 hours/day Type of Home: House Home Access: Stairs to enter Entrance Stairs-Rails: Right Entrance Stairs-Number of Steps: 4  Home Layout: Two level;Bed/bath upstairs Home Equipment: None Additional Comments: pt works as a Engineer, civil (consulting);  daughter present to help determine PLOF and home setup; pt is poor historian     Prior Function Level of Independence: Independent         Comments: up until April 19th when back pain began was working as a Engineer, civil (consulting) for [employer]        Extremity/Trunk Assessment   Upper Extremity Assessment: Defer to OT evaluation           Lower Extremity Assessment: RLE deficits/detail;LLE deficits/detail RLE Deficits / Details: during MMT pt appears strong and WFL for bil LEs; however with ambulation; pt with difficulty clearing LEs; denies any numbness or tingling but at times c/o "not feeling LEs" ; inconsistent with presentation  LLE Deficits / Details: during MMT pt appears strong and WFL for bil LEs; however with ambulation; pt with difficulty clearing LEs; denies any numbness or tingling but at times c/o "not feeling LEs" ; inconsistent with presentation   Cervical / Trunk Assessment: Normal  Communication   Communication: No difficulties  Cognition Arousal/Alertness: Awake/alert Behavior During Therapy: Impulsive;Anxious Overall Cognitive Status: Impaired/Different from baseline                      General Comments General comments (skin integrity, edema, etc.): pt inconsistent with MMT and functional strength; also inconsistent with c/o pain; daughter present throughout sesion; daughter and RN attribute confusion due to medications     Exercises        Assessment/Plan    PT Assessment Patient needs continued PT services  PT Diagnosis Abnormality of gait;Generalized weakness;Acute pain   PT Problem List Decreased strength;Decreased activity tolerance;Decreased balance;Decreased mobility;Decreased knowledge of use of DME;Decreased safety awareness;Pain;Impaired sensation;Decreased cognition  PT Treatment Interventions DME instruction;Gait training;Stair training;Functional mobility training;Therapeutic activities;Therapeutic exercise;Balance  training;Neuromuscular re-education;Patient/family education   PT Goals (Current goals can be found in the Care Plan section) Acute Rehab PT Goals Patient Stated Goal: to be able to go back to work  PT Goal Formulation: With patient Time For Goal Achievement: 12/04/13 Potential to Achieve Goals: Good    Frequency Min 4X/week   Barriers to discharge        Co-evaluation               End of Session Equipment Utilized During Treatment: Gait belt Activity Tolerance: Patient limited by fatigue;Patient limited by pain Patient left: in chair;with call bell/phone within reach;with family/visitor present Nurse Communication: Mobility status;Patient requests pain meds;Precautions         Time: 7225-7505 PT Time Calculation (min): 20 min   Charges:   PT Evaluation $Initial PT Evaluation Tier I: 1 Procedure PT Treatments $Gait Training: 8-22 mins   PT G CodesDonell Sievert, [place]  [phone] 11/27/2013, 2:01 PM

## 2013-11-28 LAB — BASIC METABOLIC PANEL
ANION GAP: 13 (ref 5–15)
BUN: 9 mg/dL (ref 6–23)
CALCIUM: 8.9 mg/dL (ref 8.4–10.5)
CO2: 23 meq/L (ref 19–32)
Chloride: 105 mEq/L (ref 96–112)
Creatinine, Ser: 0.8 mg/dL (ref 0.50–1.10)
GFR calc Af Amer: >90 mL/min (ref >90)
GFR, EST NON AFRICAN AMERICAN: 81 mL/min — AB (ref >90)
Glucose, Bld: 134 mg/dL — ABNORMAL HIGH (ref 70–99)
Potassium: 3.9 mEq/L (ref 3.7–5.3)
Sodium: 141 mEq/L (ref 137–147)

## 2013-11-28 LAB — CBC WITH DIFFERENTIAL/PLATELET
Basophils Absolute: 0 10*3/uL (ref 0.0–0.1)
Basophils Relative: 0 % (ref 0–1)
EOS PCT: 3 % (ref 0–5)
Eosinophils Absolute: 0.2 10*3/uL (ref 0.0–0.7)
HEMATOCRIT: 33.7 % — AB (ref 36.0–46.0)
Hemoglobin: 10.6 g/dL — ABNORMAL LOW (ref 12.0–15.0)
Lymphocytes Relative: 21 % (ref 12–46)
Lymphs Abs: 1.2 10*3/uL (ref 0.7–4.0)
MCH: 27.7 pg (ref 26.0–34.0)
MCHC: 31.5 g/dL (ref 30.0–36.0)
MCV: 88 fL (ref 78.0–100.0)
MONO ABS: 0.3 10*3/uL (ref 0.1–1.0)
Monocytes Relative: 4 % (ref 3–12)
Neutro Abs: 4 10*3/uL (ref 1.7–7.7)
Neutrophils Relative %: 72 % (ref 43–77)
Platelets: 170 10*3/uL (ref 150–400)
RBC: 3.83 MIL/uL — ABNORMAL LOW (ref 3.87–5.11)
RDW: 13.3 % (ref 11.5–15.5)
WBC: 5.6 10*3/uL (ref 4.0–10.5)

## 2013-11-28 LAB — VANCOMYCIN, TROUGH: Vancomycin Tr: 19.6 ug/mL (ref 10.0–20.0)

## 2013-11-28 MED ORDER — VANCOMYCIN HCL 10 G IV SOLR: 1250.0000 mg | Freq: Two times a day (BID) | INTRAVENOUS | Status: DC

## 2013-11-28 MED ORDER — POLYETHYLENE GLYCOL 3350 17 G PO PACK: 17.0000 g | PACK | Freq: Every day | ORAL | Status: DC | PRN

## 2013-11-28 MED ORDER — CYCLOBENZAPRINE HCL 10 MG PO TABS: 10.0000 mg | ORAL_TABLET | Freq: Three times a day (TID) | ORAL | Status: DC | PRN

## 2013-11-28 MED ORDER — OXYCODONE-ACETAMINOPHEN 5-325 MG PO TABS
ORAL_TABLET | ORAL | Status: DC
Start: 2013-11-28 — End: 2014-11-17

## 2013-11-28 MED ORDER — LEVOFLOXACIN 750 MG PO TABS: 750.0000 mg | ORAL_TABLET | Freq: Every day | ORAL | Status: DC

## 2013-11-28 MED ORDER — OXYCODONE-ACETAMINOPHEN 5-325 MG PO TABS
2.0000 | ORAL_TABLET | Freq: Once | ORAL | Status: AC
  Administered 2013-11-28: 2 via ORAL
  Filled 2013-11-28: qty 2

## 2013-11-28 MED ORDER — HEPARIN SOD (PORK) LOCK FLUSH 100 UNIT/ML IV SOLN: 250.0000 [IU] | INTRAVENOUS | Status: DC | PRN

## 2013-11-28 NOTE — Progress Notes (Signed)
Physical Therapy Treatment Patient Details Name: Chloe Johnson MRN: [id number] DOB: 03-Nov-1955 Today's Date: 11/28/2013    History of Present Illness 58 y.o. female with Past medical history of hypertension, neck pain, back pain, asthma, admitted on 6/24 with a discitis per MRI of L3-L4. Aspiration performed and patient discharged on 6/30. It was planned that cultures would be followed- these cultures came back negative. She was having another aspiration today in IR which revealed a 20cc of bloody aspirate.  possible osteomyelitis/discitis - cultures remain negative. infectious disease MD signing off. neuro consult pending.     PT Comments    Pt with improved ambulation tolerance and was able to complete stair negotiation this date however con't to be impulsive with poor recall of sequencing and safety. Pt family present and reports pt to have 24/7 supervision at home upon d/c.  Follow Up Recommendations  Home health PT;Supervision/Assistance - 24 hour     Equipment Recommendations  Rolling walker with 5" wheels    Recommendations for Other Services       Precautions / Restrictions Precautions Precautions: Back;Fall Restrictions Weight Bearing Restrictions: No    Mobility  Bed Mobility                  Transfers Overall transfer level: Needs assistance Equipment used: 4-wheeled walker Transfers: Sit to/from Stand Sit to Stand: Supervision         General transfer comment: v/c's for safe hand placement  Ambulation/Gait Ambulation/Gait assistance: Min guard Ambulation Distance (Feet): 150 Feet Assistive device: 4-wheeled walker;Rolling walker (2 wheeled) Gait Pattern/deviations: Step-through pattern;Decreased stride length Gait velocity: decreased   General Gait Details: max v/c's to decreased bilat UE WBing, pts 4WW adjusted for optimal supportive height. pt with decreased blat foot clearance. pt with improved amb with 2WW however pt reports she wanted 4WW so she  had a seat to sit down on when she's tired   Stairs Stairs: Yes Stairs assistance: Min guard Stair Management: One rail Right;Step to pattern Number of Stairs: 2 General stair comments: max directional v/c's for sequencing, minA via L HHA  Wheelchair Mobility    Modified Rankin (Stroke Patients Only)       Balance                                    Cognition Arousal/Alertness: Awake/alert Behavior During Therapy: Impulsive Overall Cognitive Status: Impaired/Different from baseline Area of Impairment: Following commands;Safety/judgement     Memory: Decreased short-term memory Following Commands: Follows one step commands with increased time (more short term memory deficits) Safety/Judgement: Decreased awareness of safety     General Comments: pt with tangible speech    Exercises      General Comments        Pertinent Vitals/Pain Pt denies pain at this time.    Home Living                      Prior Function            PT Goals (current goals can now be found in the care plan section) Progress towards PT goals: Progressing toward goals    Frequency  Min 4X/week    PT Plan Current plan remains appropriate    Co-evaluation             End of Session Equipment Utilized During Treatment: Gait belt Activity Tolerance: Patient tolerated treatment well Patient  left: in chair;with call bell/phone within reach;with family/visitor present     Time: [id number] PT Time Calculation (min): 41 min  Charges:  $Gait Training: 38-52 mins                    G Codes:      Marcene BrawnChadwell, Marshall Roehrich Marie 11/28/2013, 4:31 PM  Lewis ShockAshly Colen Eltzroth, PT, DPT Pager #: [phone] Office #: [phone]

## 2013-11-28 NOTE — Progress Notes (Signed)
Patient discharging home today on IV antibioitic- vancomycin; hard copy/ prescription placed on chart; talked to the patient about home health care choices, pt/ spouse chose [facility]; [facility] with [facility] called for arrangements also Jeri Modena RN with [facility] to see patient prior to discharge home concerning IV antibiotics; [facility] also called for DME; Alexis Goodell [phone]

## 2013-11-28 NOTE — Progress Notes (Signed)
ANTIBIOTIC CONSULT NOTE - FOLLOW UP  Pharmacy Consult for vancomycin and Levaquin Indication: discitis  Allergies  Allergen Reactions  . Erythromycin Nausea And Vomiting    N+V  . Latex Other (See Comments)    Irritation of the skin  . Shellfish Allergy Other (See Comments)    GI- Facial  Acne  . Strawberry Other (See Comments)    Mouth ulcers   . Vicodin [Hydrocodone-Acetaminophen] Other (See Comments)    Restless, tolerate percocet  . Keflex [Cephalexin] Rash  . Penicillins Rash    Hives  . Sulfa Antibiotics Rash    Hives    Patient Measurements: Height: 5\' 1"  (154.9 cm) Weight: 168 lb 6.9 oz (76.4 kg) IBW/kg (Calculated) : 47.8  Vital Signs: Temp: 97.5 F (36.4 C) (07/06 0517) Temp src: Oral (07/06 0517) BP: 94/58 mmHg (07/06 0517) Pulse Rate: 67 (07/06 0517) Intake/Output from previous day: 07/05 0701 - 07/06 0700 In: 180 [P.O.:180] Out: -  Intake/Output from this shift: Total I/O In: 120 [P.O.:120] Out: -   Labs:  Recent Labs  11/28/13 0850  WBC 5.6  HGB 10.6*  PLT 170  CREATININE 0.80   Estimated Creatinine Clearance: 73.4 ml/min (by C-G formula based on Cr of 0.8).  Recent Labs  11/26/13 1905 11/28/13 0858  VANCOTROUGH 14.0 19.6     Microbiology: Recent Results (from the past 720 hour(s))  URINE CULTURE     Status: None   Collection Time    11/16/13  9:59 PM      Result Value Ref Range Status   Specimen Description URINE, CLEAN CATCH   Final   Special Requests NONE   Final   Culture  Setup Time     Final   Value: 11/17/2013 09:26     Performed at [facility] Count     Final   Value: 9,000 COLONIES/ML     Performed at [facility]   Culture     Final   Value: INSIGNIFICANT GROWTH     Performed at [facility]   Report Status 11/18/2013 FINAL   Final  CULTURE, BLOOD (ROUTINE X 2)     Status: None   Collection Time    11/16/13 11:30 PM      Result Value Ref Range Status   Specimen Description  BLOOD LEFT FOREARM   Final   Special Requests     Final   Value: BOTTLES DRAWN AEROBIC AND ANAEROBIC 10CC BLUE,8CC RED   Culture  Setup Time     Final   Value: 11/17/2013 09:24     Performed at [facility]   Culture     Final   Value: NO GROWTH 5 DAYS     Performed at [facility]   Report Status 11/23/2013 FINAL   Final  CULTURE, BLOOD (ROUTINE X 2)     Status: None   Collection Time    11/16/13 11:46 PM      Result Value Ref Range Status   Specimen Description BLOOD RIGHT ARM   Final   Special Requests BOTTLES DRAWN AEROBIC AND ANAEROBIC 10CC   Final   Culture  Setup Time     Final   Value: 11/17/2013 09:24     Performed at [facility]   Culture     Final   Value: NO GROWTH 5 DAYS     Performed at [facility]   Report Status 11/23/2013 FINAL   Final  CLOSTRIDIUM DIFFICILE  BY PCR     Status: None   Collection Time    11/17/13  2:03 AM      Result Value Ref Range Status   C difficile by pcr NEGATIVE  NEGATIVE Final  ANAEROBIC CULTURE     Status: None   Collection Time    11/17/13 10:49 AM      Result Value Ref Range Status   Specimen Description ABSCESS BACK   Final   Special Requests L3 4 DISC   Final   Gram Stain     Final   Value: FEW WBC PRESENT,BOTH PMN AND MONONUCLEAR     NO SQUAMOUS EPITHELIAL CELLS SEEN     NO ORGANISMS SEEN     Performed at [facility]   Culture     Final   Value: NO ANAEROBES ISOLATED     Performed at [facility]   Report Status 11/22/2013 FINAL   Final  CULTURE, ROUTINE-ABSCESS     Status: None   Collection Time    11/17/13 10:49 AM      Result Value Ref Range Status   Specimen Description ABSCESS BACK   Final   Special Requests L3 4 DISC   Final   Gram Stain     Final   Value: FEW WBC PRESENT,BOTH PMN AND MONONUCLEAR     NO SQUAMOUS EPITHELIAL CELLS SEEN     NO ORGANISMS SEEN     Performed at [facility]   Culture     Final   Value: NO GROWTH 3 DAYS     Performed at  [facility]   Report Status 11/20/2013 FINAL   Final  AFB CULTURE WITH SMEAR     Status: None   Collection Time    11/24/13  9:05 AM      Result Value Ref Range Status   Preliminary Report Culture will be examined for 6 weeks before    Preliminary   Preliminary Report issuing a Final Report.   Preliminary   Acid Fast Smear No Acid Fast Bacilli Seen   Preliminary  ANAEROBIC CULTURE     Status: None   Collection Time    11/24/13  9:05 AM      Result Value Ref Range Status   Gram Stain No WBC Seen   Preliminary   Gram Stain No Organisms Seen   Preliminary   Preliminary Report No Anaerobes Isolated; Culture in Progress for    Preliminary   Preliminary Report 5 Days   Preliminary  FUNGUS CULTURE W SMEAR     Status: None   Collection Time    11/24/13  9:05 AM      Result Value Ref Range Status   Preliminary Report Culture in Progress for 4 Weeks   Preliminary   Smear Result No Yeast or Fungal Elements Seen   Preliminary  BODY FLUID CULTURE     Status: None   Collection Time    11/24/13  9:05 AM      Result Value Ref Range Status   Gram Stain No WBC Seen   Final   Gram Stain No Organisms Seen   Final   Organism ID, Bacteria NO GROWTH 3 DAYS   Final    Anti-infectives   Start     Dose/Rate Route Frequency Ordered Stop   11/28/13 0000  levofloxacin (LEVAQUIN) 750 MG tablet     750 mg Oral Daily 11/28/13 0756     11/28/13 0000  vancomycin 1,250 mg  in sodium chloride 0.9 % 250 mL     1,250 mg 166.7 mL/hr over 90 Minutes Intravenous Every 12 hours 11/28/13 0756     11/26/13 2045  vancomycin (VANCOCIN) 1,250 mg in sodium chloride 0.9 % 250 mL IVPB     1,250 mg 166.7 mL/hr over 90 Minutes Intravenous Every 12 hours 11/26/13 2015     11/25/13 1315  levofloxacin (LEVAQUIN) tablet 750 mg     750 mg Oral Daily 11/25/13 1302     11/24/13 2000  vancomycin (VANCOCIN) IVPB 1000 mg/200 mL premix  Status:  Discontinued     1,000 mg 200 mL/hr over 60 Minutes Intravenous Every 12 hours  11/24/13 1901 11/26/13 2015   11/24/13 1930  levofloxacin (LEVAQUIN) IVPB 750 mg  Status:  Discontinued     750 mg 100 mL/hr over 90 Minutes Intravenous Every 24 hours 11/24/13 1901 11/25/13 1302   11/24/13 1715  vancomycin (VANCOCIN) 15 mg/kg in sodium chloride 0.9 % 100 mL IVPB  Status:  Discontinued     15 mg/kg 100 mL/hr over 60 Minutes Intravenous  Once 11/24/13 1702 11/24/13 1706   11/24/13 1715  ciprofloxacin (CIPRO) IVPB 400 mg  Status:  Discontinued     400 mg 200 mL/hr over 60 Minutes Intravenous  Once 11/24/13 1702 11/24/13 1752   11/24/13 1715  vancomycin (VANCOCIN) IVPB 1000 mg/200 mL premix  Status:  Discontinued     1,000 mg 200 mL/hr over 60 Minutes Intravenous  Once 11/24/13 1706 11/24/13 1900      Assessment: 58 y/o female on vancomycin and Levaquin for discitis with planned treatment for at least 6 weeks per ID recommendation. Vancomycin trough is therapeutic this morning at 19.6. Renal function remains stable.  7/2 Vanc >> 7/2 Levaquin>>  7/4 VT = 14 on 1g q12, increase dose to 1250mg  q12  7/2 lumbar aspirate>>NGTD (have to look under chart review ->micro data to see this) 6/25 back abscess>>negative final 6/25 cdiff negative 6/24 blood x 2>> 6/24 urine>>negative final  Goal of Therapy:  Vancomycin trough level 15-20 mcg/ml Eradication of infection  Plan:  - Continue vancomycin 1250 mg IV q12h - Continue Levaquin 750 mg PO daily - Recommend checking at least a weekly vancomycin trough outpatient  [facility] Re[facility] Surgery Of Puget SoundJennifer , St. JosephPharm.D., BCPS Clinical Pharmacist Pager: [phone] 11/28/2013 9:57 AM

## 2013-11-28 NOTE — Progress Notes (Signed)
Pt A&O x4; pt discharge education and instructions completed with pt and family at bedside; all voices understanding and denies any questions. Pt voices MD had already given her the prescription for Levaquin, Percocet (5-325), Miralax and Vancomycin. Pt IV hep locked by IV team; pt discharge home with IV antibiotics and case management has already set it up with Home Health care. Pt to be transported off unit via wheelchair with family and belongings at side and family to transport pt to disposition. Arabella Merles Adilee Lemme RN.

## 2013-11-29 ENCOUNTER — Encounter (HOSPITAL_COMMUNITY): Payer: Self-pay | Admitting: Emergency Medicine

## 2013-11-29 ENCOUNTER — Inpatient Hospital Stay (HOSPITAL_COMMUNITY)
Admission: EM | Admit: 2013-11-29 | Discharge: 2013-12-02 | DRG: 552 | Disposition: A | Discharge status: Home / Self Care | Payer: Managed Care, Other (non HMO) | Attending: Internal Medicine | Admitting: Internal Medicine

## 2013-11-29 DIAGNOSIS — T40605A Adverse effect of unspecified narcotics, initial encounter: Secondary | ICD-10-CM | POA: Diagnosis present

## 2013-11-29 DIAGNOSIS — Z8249 Family history of ischemic heart disease and other diseases of the circulatory system: Secondary | ICD-10-CM

## 2013-11-29 DIAGNOSIS — I10 Essential (primary) hypertension: Secondary | ICD-10-CM | POA: Diagnosis present

## 2013-11-29 DIAGNOSIS — M5030 Other cervical disc degeneration, unspecified cervical region: Secondary | ICD-10-CM | POA: Diagnosis present

## 2013-11-29 DIAGNOSIS — J45909 Unspecified asthma, uncomplicated: Secondary | ICD-10-CM | POA: Diagnosis present

## 2013-11-29 DIAGNOSIS — K5900 Constipation, unspecified: Secondary | ICD-10-CM

## 2013-11-29 DIAGNOSIS — M4646 Discitis, unspecified, lumbar region: Secondary | ICD-10-CM | POA: Diagnosis present

## 2013-11-29 DIAGNOSIS — Z515 Encounter for palliative care: Secondary | ICD-10-CM

## 2013-11-29 DIAGNOSIS — F411 Generalized anxiety disorder: Secondary | ICD-10-CM | POA: Diagnosis present

## 2013-11-29 DIAGNOSIS — R52 Pain, unspecified: Secondary | ICD-10-CM

## 2013-11-29 DIAGNOSIS — M519 Unspecified thoracic, thoracolumbar and lumbosacral intervertebral disc disorder: Principal | ICD-10-CM | POA: Diagnosis present

## 2013-11-29 DIAGNOSIS — K219 Gastro-esophageal reflux disease without esophagitis: Secondary | ICD-10-CM | POA: Diagnosis present

## 2013-11-29 HISTORY — DX: Osteomyelitis, unspecified: M86.9

## 2013-11-29 HISTORY — DX: Dorsalgia, unspecified: M54.9

## 2013-11-29 LAB — ANAEROBIC CULTURE
GRAM STAIN: NONE SEEN
Gram Stain: NONE SEEN

## 2013-11-29 LAB — CBC
HEMATOCRIT: 30.2 % — AB (ref 36.0–46.0)
Hemoglobin: 9.8 g/dL — ABNORMAL LOW (ref 12.0–15.0)
MCH: 27.5 pg (ref 26.0–34.0)
MCHC: 32.5 g/dL (ref 30.0–36.0)
MCV: 84.8 fL (ref 78.0–100.0)
Platelets: 199 10*3/uL (ref 150–400)
RBC: 3.56 MIL/uL — AB (ref 3.87–5.11)
RDW: 13.1 % (ref 11.5–15.5)
WBC: 7.5 10*3/uL (ref 4.0–10.5)

## 2013-11-29 LAB — CREATININE, SERUM
Creatinine, Ser: 0.77 mg/dL (ref 0.50–1.10)
GFR calc non Af Amer: >90 mL/min (ref >90)

## 2013-11-29 MED ORDER — PANTOPRAZOLE SODIUM 40 MG PO TBEC
80.0000 mg | DELAYED_RELEASE_TABLET | Freq: Every day | ORAL | Status: DC
  Administered 2013-11-29 – 2013-11-30 (×2): 80 mg via ORAL
  Filled 2013-11-29 (×3): qty 2

## 2013-11-29 MED ORDER — HYDROMORPHONE HCL PF 1 MG/ML IJ SOLN
1.0000 mg | Freq: Once | INTRAMUSCULAR | Status: AC
  Administered 2013-11-29: 1 mg via INTRAVENOUS
  Filled 2013-11-29: qty 1

## 2013-11-29 MED ORDER — CYCLOBENZAPRINE HCL 10 MG PO TABS
10.0000 mg | ORAL_TABLET | Freq: Three times a day (TID) | ORAL | Status: DC | PRN
  Administered 2013-11-29 – 2013-11-30 (×3): 10 mg via ORAL
  Filled 2013-11-29 (×3): qty 1

## 2013-11-29 MED ORDER — LORATADINE 10 MG PO TABS: 10.0000 mg | ORAL_TABLET | Freq: Every day | ORAL | Status: DC | PRN

## 2013-11-29 MED ORDER — NAPROXEN 250 MG PO TABS
375.0000 mg | ORAL_TABLET | Freq: Two times a day (BID) | ORAL | Status: DC
  Administered 2013-11-29: 375 mg via ORAL
  Filled 2013-11-29 (×3): qty 2

## 2013-11-29 MED ORDER — ONDANSETRON HCL 4 MG/2ML IJ SOLN: 4.0000 mg | Freq: Four times a day (QID) | INTRAMUSCULAR | Status: DC | PRN

## 2013-11-29 MED ORDER — SENNOSIDES-DOCUSATE SODIUM 8.6-50 MG PO TABS
1.0000 | ORAL_TABLET | Freq: Every day | ORAL | Status: DC
  Administered 2013-11-29: 1 via ORAL
  Filled 2013-11-29: qty 1

## 2013-11-29 MED ORDER — ENOXAPARIN SODIUM 40 MG/0.4ML ~~LOC~~ SOLN
40.0000 mg | SUBCUTANEOUS | Status: DC
  Administered 2013-11-29 – 2013-12-02 (×4): 40 mg via SUBCUTANEOUS
  Filled 2013-11-29 (×4): qty 0.4

## 2013-11-29 MED ORDER — LEVOFLOXACIN 750 MG PO TABS
750.0000 mg | ORAL_TABLET | Freq: Every day | ORAL | Status: DC
  Administered 2013-11-29 – 2013-12-02 (×4): 750 mg via ORAL
  Filled 2013-11-29 (×4): qty 1

## 2013-11-29 MED ORDER — GABAPENTIN 600 MG PO TABS
600.0000 mg | ORAL_TABLET | Freq: Two times a day (BID) | ORAL | Status: DC
  Administered 2013-11-29 (×2): 600 mg via ORAL
  Filled 2013-11-29 (×2): qty 1

## 2013-11-29 MED ORDER — DIAZEPAM 5 MG PO TABS
5.0000 mg | ORAL_TABLET | Freq: Once | ORAL | Status: AC
  Administered 2013-11-29: 5 mg via ORAL
  Filled 2013-11-29: qty 1

## 2013-11-29 MED ORDER — FLUOXETINE HCL 20 MG PO CAPS
20.0000 mg | ORAL_CAPSULE | Freq: Every day | ORAL | Status: DC
  Administered 2013-11-29 – 2013-12-02 (×4): 20 mg via ORAL
  Filled 2013-11-29 (×5): qty 1

## 2013-11-29 MED ORDER — ALUM & MAG HYDROXIDE-SIMETH 200-200-20 MG/5ML PO SUSP: 30.0000 mL | Freq: Four times a day (QID) | ORAL | Status: DC | PRN

## 2013-11-29 MED ORDER — DIPHENHYDRAMINE HCL 50 MG/ML IJ SOLN
25.0000 mg | Freq: Once | INTRAMUSCULAR | Status: AC
  Administered 2013-11-29: 25 mg via INTRAVENOUS
  Filled 2013-11-29: qty 1

## 2013-11-29 MED ORDER — POLYETHYLENE GLYCOL 3350 17 G PO PACK
17.0000 g | PACK | Freq: Every day | ORAL | Status: DC | PRN
  Filled 2013-11-29: qty 1

## 2013-11-29 MED ORDER — ACETAMINOPHEN 650 MG RE SUPP: 650.0000 mg | Freq: Four times a day (QID) | RECTAL | Status: DC | PRN

## 2013-11-29 MED ORDER — TRIAMCINOLONE ACETONIDE 55 MCG/ACT NA AERO
1.0000 | INHALATION_SPRAY | Freq: Every day | NASAL | Status: DC | PRN
  Administered 2013-12-01: 1 via NASAL
  Filled 2013-11-29: qty 10.8

## 2013-11-29 MED ORDER — ONDANSETRON HCL 4 MG PO TABS: 4.0000 mg | ORAL_TABLET | Freq: Four times a day (QID) | ORAL | Status: DC | PRN

## 2013-11-29 MED ORDER — OXYCODONE HCL ER 15 MG PO T12A
15.0000 mg | EXTENDED_RELEASE_TABLET | Freq: Two times a day (BID) | ORAL | Status: DC
  Administered 2013-11-29: 15 mg via ORAL
  Filled 2013-11-29: qty 1

## 2013-11-29 MED ORDER — OXYCODONE HCL ER 10 MG PO T12A
20.0000 mg | EXTENDED_RELEASE_TABLET | Freq: Two times a day (BID) | ORAL | Status: DC
  Administered 2013-11-29 – 2013-11-30 (×2): 20 mg via ORAL
  Filled 2013-11-29 (×2): qty 2

## 2013-11-29 MED ORDER — VANCOMYCIN HCL 10 G IV SOLR
1250.0000 mg | Freq: Two times a day (BID) | INTRAVENOUS | Status: DC
  Administered 2013-11-29 – 2013-12-02 (×7): 1250 mg via INTRAVENOUS
  Filled 2013-11-29 (×8): qty 1250

## 2013-11-29 MED ORDER — OXYCODONE-ACETAMINOPHEN 5-325 MG PO TABS
1.0000 | ORAL_TABLET | ORAL | Status: DC | PRN
  Administered 2013-11-29 – 2013-12-02 (×13): 1 via ORAL
  Filled 2013-11-29 (×13): qty 1

## 2013-11-29 MED ORDER — OXYCODONE-ACETAMINOPHEN 5-325 MG PO TABS: 2.0000 | ORAL_TABLET | Freq: Four times a day (QID) | ORAL | Status: DC | PRN

## 2013-11-29 MED ORDER — ACETAMINOPHEN 325 MG PO TABS: 650.0000 mg | ORAL_TABLET | Freq: Four times a day (QID) | ORAL | Status: DC | PRN

## 2013-11-29 MED ORDER — KETOROLAC TROMETHAMINE 30 MG/ML IJ SOLN
30.0000 mg | Freq: Once | INTRAMUSCULAR | Status: AC
  Administered 2013-11-29: 30 mg via INTRAVENOUS
  Filled 2013-11-29: qty 1

## 2013-11-29 NOTE — H&P (Signed)
[facility] History and Physical  Chloe Johnson UTM:[id number] DOB: 07-11-1955 DOA: 11/29/2013  Referring physician: Dr Judd Lien PCP: Lillia Mountain, MD    Chief Complaint: uncontrolled back pain  HPI: Chloe Johnson is a 58 y.o. female with PMH  of hypertension, neck pain, chronic back pain requiring epidural injections, asthma who currently is being treated for a discitis of L3-L4 suspected to be due to the epidural injections.  Admitted on 7/2 and discharged on 7/6 (yesterday) with Vanc, Levaquin, Percocet and Flexeril. Goal was to limit pain medications and sedation. Prior to the 7/2 admission, she was on MSIR and Valium at home. After discharge, she had uncontrolled pain lasting through the night and had to come back to the ER. She has received VAlium, Toradol and Dilaudid IV and currently has no pain. She is sleeping but easily wakable. We are admitting her to find adequate pain control without sedation.     ROS- 12 point ROS negative  Past Medical History  Diagnosis Date  . Hypertension   . Neck pain   . Anxiety   . Kidney stones     "have always passed them"  . Exertional asthma   . GERD (gastroesophageal reflux disease)   . Discitis of lumbar region 11/16/2013    L3-4/notes 11/24/2013   Past Surgical History  Procedure Laterality Date  . Tonsillectomy and adenoidectomy  1975  . Tubal ligation  1999  . Breast cyst excision Left 2010    "polypectomy"   Social History:  reports that she has never smoked. She has never used smokeless tobacco. She reports that she drinks about 3.6 ounces of alcohol per week. She reports that she does not use illicit drugs. Lives at home with husband Allergies  Allergen Reactions  . Erythromycin Nausea And Vomiting    N+V  . Latex Other (See Comments)    Irritation of the skin  . Shellfish Allergy Other (See Comments)    GI- Facial  Acne  . Strawberry Other (See Comments)    Mouth ulcers   . Vicodin [Hydrocodone-Acetaminophen] Other  (See Comments)    Restless, tolerate percocet  . Keflex [Cephalexin] Rash  . Penicillins Rash    Hives  . Sulfa Antibiotics Rash    Hives    Family History  Problem Relation Age of Onset  . Other Father   . Hypertension Father       Prior to Admission medications   Medication Sig Start Date End Date Taking? Authorizing Provider  cyclobenzaprine (FLEXERIL) 10 MG tablet Take 1 tablet (10 mg total) by mouth 3 (three) times daily as needed for muscle spasms. 11/28/13  Yes Lillia Mountain, MD  esomeprazole (NEXIUM) 40 MG capsule Take 40 mg by mouth daily as needed (for acid reflux).    Yes Historical Provider, MD  EVENING PRIMROSE OIL PO Take 1,000 mg by mouth 2 (two) times daily.   Yes Historical Provider, MD  FLUoxetine (PROZAC) 20 MG capsule Take 20 mg by mouth daily.  10/08/13  Yes Historical Provider, MD  gabapentin (NEURONTIN) 600 MG tablet Take 600 mg by mouth 2 (two) times daily.    Yes Historical Provider, MD  levofloxacin (LEVAQUIN) 750 MG tablet Take 1 tablet (750 mg total) by mouth daily. 11/28/13  Yes Lillia Mountain, MD  loratadine (CLARITIN) 10 MG tablet Take 10 mg by mouth daily as needed for allergies.   Yes Historical Provider, MD  Omega-3 Fatty Acids (FISH OIL) 1200 MG CAPS Take 1,200 mg by mouth 2 (  two) times daily.   Yes Historical Provider, MD  OVER THE COUNTER MEDICATION Take 1 tablet by mouth daily. "Estrovera"   Yes Historical Provider, MD  OVER THE COUNTER MEDICATION Take 1-3 tablets by mouth daily as needed (for constipation). "Super Cleanse"   Yes Historical Provider, MD  oxyCODONE-acetaminophen (PERCOCET) 5-325 MG per tablet 1 to 2 tablets every 6 hours as needed for pain. 11/28/13  Yes Lillia MountainJohn Joseph Griffin, MD  triamcinolone (NASACORT ALLERGY 24HR) 55 MCG/ACT AERO nasal inhaler Place 1 spray into both nostrils daily as needed (for allergies).   Yes Historical Provider, MD  vancomycin 1,250 mg in sodium chloride 0.9 % 250 mL Inject 1,250 mg into the vein every 12  (twelve) hours. 11/28/13  Yes Lillia MountainJohn Joseph Griffin, MD  polyethylene glycol [facility](MIRALAX / Ethelene HalGLYCOLAX) packet Take 17 g by mouth daily as needed for mild constipation. 11/28/13   Lillia MountainJohn Joseph Griffin, MD     Physical Exam: Vitals Filed Vitals:   11/29/13 0750 11/29/13 0800 11/29/13 0915 11/29/13 0917  BP: 144/78 136/76 113/78 124/81  Pulse:  80 78   Temp:      TempSrc:      Resp: 22 19  23   Height:      Weight:      SpO2: 100%  98% 98%     General: AAO x 3, no distress HEENT: Normocephalic and Atraumatic, Mucous membranes pink                PERRLA; EOM intact; No scleral icterus,                 Nares: Patent, Oropharynx: Clear, Fair Dentition                 Neck: FROM, no cervical lymphadenopathy, thyromegaly, carotid bruit or JVD;  Breasts: deferred CHEST WALL: No tenderness  CHEST: Normal respiration, clear to auscultation bilaterally  HEART: Regular rate and rhythm; no murmurs rubs or gallops  BACK: No kyphosis or scoliosis; no CVA tenderness  ABDOMEN: Positive Bowel Sounds, soft, non-tender; no masses, no organomegaly Rectal Exam: deferred EXTREMITIES: No cyanosis, clubbing, or edema Genitalia: not examined  SKIN:  no rash or ulceration  CNS: Alert and Oriented x 4, Nonfocal exam, CN 2-12 intact  Labs on Admission:  Basic Metabolic Panel:  Recent Labs Lab 11/24/13 1627 11/25/13 0556 11/28/13 0850  NA 136* 139 141  K 3.7 3.8 3.9  CL 94* 99 105  CO2 26 28 23   GLUCOSE 99 106* 134*  BUN 11 8 9   CREATININE 0.76 0.73 0.80  CALCIUM 9.7 9.3 8.9   Liver Function Tests: No results found for this basename: AST, ALT, ALKPHOS, BILITOT, PROT, ALBUMIN,  in the last 168 hours No results found for this basename: LIPASE, AMYLASE,  in the last 168 hours No results found for this basename: AMMONIA,  in the last 168 hours CBC:  Recent Labs Lab 11/24/13 1627 11/25/13 0556 11/28/13 0850  WBC 7.4 5.9 5.6  NEUTROABS 5.0  --  4.0  HGB 11.4* 10.6* 10.6*  HCT 35.3* 32.9* 33.7*   MCV 86.3 86.6 88.0  PLT 237 227 170   Cardiac Enzymes: No results found for this basename: CKTOTAL, CKMB, CKMBINDEX, TROPONINI,  in the last 168 hours  BNP (last 3 results) No results found for this basename: PROBNP,  in the last 8760 hours CBG:  Recent Labs Lab 11/26/13 2355  GLUCAP 110*    Radiological Exams on Admission: No results found.   Assessment/Plan Principal  Problem:   Lumbar discitis- possible osteomyelitis - cont Vanc and Levaquin per ID for 6 wks- cultures still pending - start low dose OxyContin  15mg  BID with Oxycodone for breakthrough pain - start Naprosyn 375 mg BID - avoid Valium  Active Problems:   Hypertension - cont home meds    Asthma - stable    Consulted: none  Code Status: Full code Family Communication: husband  Disposition Plan: home    Time spent: >45 min  Chloe Duplessis, MD [facility]  If 7PM-7AM, please contact night-coverage [email or web address] 11/29/2013, 10:23 AM

## 2013-11-29 NOTE — ED Notes (Addendum)
Patient presents via EMS c/o pain from the lower abd down her legs.  Was discharged from this facility Monday, 7/6 diagnosed with osteomylitis  In L 3 and 4,  BP 150/90, P 90.  Patient took Flexeril 10mg  and Percocet at 3 AM due to the pain.  Patient has a PICC in the right upper arm

## 2013-11-29 NOTE — Progress Notes (Signed)
[facility]  Patient Status:  Active patient with [facility] just prior to this admission. Pt was discharged home on IV ABX on 11-28-13.  [facility] is providing the following services:   HHRN , PT and [facility] for home IV ABX.   If patient discharges after hours, please call [phone].   Sedalia Muta 11/29/2013, 10:33 PM

## 2013-11-29 NOTE — ED Notes (Signed)
Attempted to get up to the bathroom.  Unable to walk, assisted back to bed.

## 2013-11-29 NOTE — ED Notes (Signed)
Pt requesting husband in room.

## 2013-11-29 NOTE — ED Provider Notes (Signed)
CSN: [id number]     Arrival date & time 11/29/13  0174 History   First MD Initiated Contact with Patient 11/29/13 [phone]     Chief Complaint  Patient presents with  . Leg Pain     (Consider location/radiation/quality/duration/timing/severity/associated sxs/prior Treatment) HPI Comments: Patient is a 58 year old female with past medical history of hypertension. She was recently diagnosed with discitis of the lumbar spine. She is currently being treated with IV vancomycin through a PICC line that was inserted during a recent hospitalization. She was discharged yesterday evening at approximately 6 PM. Since that time her pain has significantly worsened and now she is unable to ambulate due to pain. There has been no new injury or trauma. She denies any bowel or bladder incontinence.  Patient is a 59 y.o. female presenting with back pain. The history is provided by the patient.  Back Pain Location:  Lumbar spine Quality:  Stabbing Radiates to:  Does not radiate Pain severity:  Severe Duration:  1 day Timing:  Constant Progression:  Worsening Chronicity:  New   Past Medical History  Diagnosis Date  . Hypertension   . Neck pain   . Anxiety   . Kidney stones     "have always passed them"  . Exertional asthma   . GERD (gastroesophageal reflux disease)   . Discitis of lumbar region 11/16/2013    L3-4/notes 11/24/2013   Past Surgical History  Procedure Laterality Date  . Tonsillectomy and adenoidectomy  1975  . Tubal ligation  1999  . Breast cyst excision Left 2010    "polypectomy"   Family History  Problem Relation Age of Onset  . Other Father   . Hypertension Father    History  Substance Use Topics  . Smoking status: Never Smoker   . Smokeless tobacco: Never Used  . Alcohol Use: 3.6 oz/week    3 Glasses of wine, 3 Shots of liquor per week   OB History   Grav Para Term Preterm Abortions TAB SAB Ect Mult Living                 Review of Systems  Musculoskeletal: Positive  for back pain.  All other systems reviewed and are negative.     Allergies  Erythromycin; Latex; Shellfish allergy; Strawberry; Vicodin; Keflex; Penicillins; and Sulfa antibiotics  Home Medications   Prior to Admission medications   Medication Sig Start Date End Date Taking? Authorizing Provider  cyclobenzaprine (FLEXERIL) 10 MG tablet Take 1 tablet (10 mg total) by mouth 3 (three) times daily as needed for muscle spasms. 11/28/13   Lillia Mountain, MD  esomeprazole (NEXIUM) 40 MG capsule Take 40 mg by mouth daily as needed.     Historical Provider, MD  EVENING PRIMROSE OIL PO Take 1,000 mg by mouth 2 (two) times daily.    Historical Provider, MD  FLUoxetine (PROZAC) 20 MG capsule Take 20 mg by mouth daily.  10/08/13   Historical Provider, MD  gabapentin (NEURONTIN) 600 MG tablet Take 600 mg by mouth 2 (two) times daily.     Historical Provider, MD  levofloxacin (LEVAQUIN) 750 MG tablet Take 1 tablet (750 mg total) by mouth daily. 11/28/13   Lillia Mountain, MD  loratadine (CLARITIN) 10 MG tablet Take 10 mg by mouth daily as needed for allergies.    Historical Provider, MD  morphine (MSIR) 15 MG tablet Take 15 mg by mouth every 4 (four) hours as needed for severe pain.    Historical Provider, MD  Omega-3 Fatty Acids (FISH OIL) 1200 MG CAPS Take 1,200 mg by mouth 2 (two) times daily.    Historical Provider, MD  OVER THE COUNTER MEDICATION Take 1 tablet by mouth daily. "Estrovera"    Historical Provider, MD  OVER THE COUNTER MEDICATION Take 1-3 tablets by mouth daily as needed (for constipation). "Super Cleanse"    Historical Provider, MD  oxyCODONE-acetaminophen (PERCOCET) 5-325 MG per tablet 1 to 2 tablets every 6 hours as needed for pain. 11/28/13   Lillia MountainJohn Joseph Griffin, MD  polyethylene glycol [facility](MIRALAX / Ethelene HalGLYCOLAX) packet Take 17 g by mouth daily as needed for mild constipation. 11/28/13   Lillia MountainJohn Joseph Griffin, MD  triamcinolone (NASACORT ALLERGY 24HR) 55 MCG/ACT AERO nasal inhaler Place 1  spray into both nostrils daily as needed (for allergies).    Historical Provider, MD  vancomycin 1,250 mg in sodium chloride 0.9 % 250 mL Inject 1,250 mg into the vein every 12 (twelve) hours. 11/28/13   Lillia MountainJohn Joseph Griffin, MD   BP 133/78  Pulse 87  Temp(Src) 97.5 F (36.4 C) (Oral)  Resp 19  Ht 5\' 1"  (1.549 m)  Wt 164 lb (74.39 kg)  BMI 31.00 kg/m2  SpO2 100% Physical Exam  Nursing note and vitals reviewed. Constitutional: She is oriented to person, place, and time. She appears well-developed and well-nourished.  Patient is a 58 year old female who is anxious and tearful.  HENT:  Head: Normocephalic and atraumatic.  Neck: Normal range of motion. Neck supple.  Neurological: She is alert and oriented to person, place, and time.  DTRs are 1+ and equal in the Achilles and patellar tendons. Strength is 5 out of 5 in bilateral lower extremities. She is unable to sit up in bed, much less walk.  Skin: Skin is warm and dry.    ED Course  Procedures (including critical care time) Labs Review Labs Reviewed - No data to display  Imaging Review No results found.   EKG Interpretation None      MDM   Final diagnoses:  None    Patient presents with severe back pain and history of discitis. She was just discharged yesterday evening but states that her oral pain medication is not helping. She is unable to sit up or ambulate due to the discomfort. She was treated with a lot of, Toradol, and Valium, however is not feeling any better. I've spoken with Dr. Butler Denmarkizwan to evaluate the patient in the ER. She has asked that I admit to Dr. Valentina LucksGriffin.    Geoffery Lyonsouglas Vallarie Fei, MD 11/29/13 [phone]

## 2013-11-29 NOTE — Discharge Summary (Signed)
Physician Discharge Summary  Patient ID: Chloe Johnson MRN: [id number] DOB/AGE: Jan 01, 1956 58 y.o.  Admit date: 11/24/2013 Discharge date: 11/29/2013  Admission Diagnoses: Discitis lumbar region Low Back pain Hypertension  Discharge Diagnoses:  Principal Problem:   Discitis of lumbar region Active Problems: Low back pain   Hypertension      Discharged Condition: good  Hospital Course: The patient was admitted on July 2 complaining of severe low back pain. She did been previously admitted on June 24 with discitis on MRI L3-4, aspiration performed at that time, cultures were negative. The patient had another aspiration the day of admission by interventional radiology which revealed 20 cc of bloody aspirate. She was sent to the ER for initiation of antibiotic treatment. She was started on vancomycin and Levaquin. She was seen by Dr. Luciana Axe of infectious disease who recommended IV vancomycin and oral Levaquin for 6 weeks minimum. Baseline CRP 4.1 and ESR 64. PICC line placed. Weekly CBC and complete metabolic profile recommended and followup ID 2 weeks. The patient was treated with fentanyl, morphine and Valium for pain control. This combination resulted in a drop in her blood pressure which responded to normal saline bolus. She also was noted to have some oversedation and difficulty with balance while walking with physical therapy. Her Valium was cut back considerably and flexural substituted. Fentanyl was stopped. Our goal is to treat her pain with oxycodone and limit morphine for severe pain. At discharge her mental status was clear. Rolling walker was obtained and home physical therapy and home nursing were arranged. Her blood pressure medicines were held at discharge is her blood pressure was still running low, at discharge 104/67  Consults: ID  Significant Diagnostic Studies: labs: At discharge sodium 141 potassium 2.9 chloride 104 carbonate 23, BUN 9, creatinine 0.8, vancomycin trough 19.6, CBC  WBC 5.6, hemoglobin 10.6, platelets 170. Sedimentation rate 64, CRP 4.1  Treatments: IV hydration, antibiotics: vancomycin and Levaquin and analgesia: Morphine and fentanyl and oxycodone  Discharge Exam: Blood pressure 104/67, pulse 75, temperature 97.2 F (36.2 C), temperature source Oral, resp. rate 20, height 5\' 1"  (1.549 m), weight 76.4 kg (168 lb 6.9 oz), SpO2 97.00%. General appearance: alert and cooperative Resp: clear to auscultation bilaterally Cardio: regular rate and rhythm, S1, S2 normal, no murmur, click, rub or gallop  Disposition: 01-Home or Self Care     Medication List    STOP taking these medications       diazepam 10 MG tablet  Commonly known as:  VALIUM     lisinopril-hydrochlorothiazide 10-12.5 MG per tablet  Commonly known as:  PRINZIDE,ZESTORETIC     potassium chloride SA 20 MEQ tablet  Commonly known as:  K-DUR,KLOR-CON      TAKE these medications       cyclobenzaprine 10 MG tablet  Commonly known as:  FLEXERIL  Take 1 tablet (10 mg total) by mouth 3 (three) times daily as needed for muscle spasms.     esomeprazole 40 MG capsule  Commonly known as:  NEXIUM  Take 40 mg by mouth daily as needed.     EVENING PRIMROSE OIL PO  Take 1,000 mg by mouth 2 (two) times daily.     Fish Oil 1200 MG Caps  Take 1,200 mg by mouth 2 (two) times daily.     FLUoxetine 20 MG capsule  Commonly known as:  PROZAC  Take 20 mg by mouth daily.     gabapentin 600 MG tablet  Commonly known as:  NEURONTIN  Take  600 mg by mouth 2 (two) times daily.     levofloxacin 750 MG tablet  Commonly known as:  LEVAQUIN  Take 1 tablet (750 mg total) by mouth daily.     loratadine 10 MG tablet  Commonly known as:  CLARITIN  Take 10 mg by mouth daily as needed for allergies.     morphine 15 MG tablet  Commonly known as:  MSIR  Take 15 mg by mouth every 4 (four) hours as needed for severe pain.     NASACORT ALLERGY 24HR 55 MCG/ACT Aero nasal inhaler  Generic drug:   triamcinolone  Place 1 spray into both nostrils daily as needed (for allergies).     OVER THE COUNTER MEDICATION  Take 1 tablet by mouth daily. "Estrovera"     OVER THE COUNTER MEDICATION  Take 1-3 tablets by mouth daily as needed (for constipation). "Super Cleanse"     oxyCODONE-acetaminophen 5-325 MG per tablet  Commonly known as:  PERCOCET  1 to 2 tablets every 6 hours as needed for pain.     polyethylene glycol packet  Commonly known as:  MIRALAX / GLYCOLAX  Take 17 g by mouth daily as needed for mild constipation.     vancomycin 1,250 mg in sodium chloride 0.9 % 250 mL  Inject 1,250 mg into the vein every 12 (twelve) hours.           Follow-up Information   Follow up with Scharlene Gloss, MD In 2 weeks.   Specialty:  Infectious Diseases   Contact information:   [address] [address] [phone]       Follow up with Irven Shelling, MD In 1 week.   Specialty:  Internal Medicine   Contact information:   [address] [place] [address] [phone]       Signed: Irven Shelling 11/29/2013, 6:33 AM

## 2013-11-30 DIAGNOSIS — R52 Pain, unspecified: Secondary | ICD-10-CM

## 2013-11-30 DIAGNOSIS — Z515 Encounter for palliative care: Secondary | ICD-10-CM

## 2013-11-30 DIAGNOSIS — K5900 Constipation, unspecified: Secondary | ICD-10-CM

## 2013-11-30 MED ORDER — SENNOSIDES-DOCUSATE SODIUM 8.6-50 MG PO TABS
2.0000 | ORAL_TABLET | Freq: Every day | ORAL | Status: DC
  Administered 2013-11-30: 2 via ORAL
  Filled 2013-11-30 (×2): qty 2

## 2013-11-30 MED ORDER — CYCLOBENZAPRINE HCL 10 MG PO TABS
10.0000 mg | ORAL_TABLET | Freq: Three times a day (TID) | ORAL | Status: DC
  Administered 2013-11-30 – 2013-12-02 (×6): 10 mg via ORAL
  Filled 2013-11-30 (×6): qty 1

## 2013-11-30 MED ORDER — KETOROLAC TROMETHAMINE 30 MG/ML IJ SOLN
30.0000 mg | Freq: Four times a day (QID) | INTRAMUSCULAR | Status: DC
  Administered 2013-11-30 – 2013-12-01 (×5): 30 mg via INTRAVENOUS
  Filled 2013-11-30 (×5): qty 1

## 2013-11-30 MED ORDER — LIDOCAINE 5 % EX PTCH
1.0000 | MEDICATED_PATCH | CUTANEOUS | Status: DC
  Administered 2013-12-01 – 2013-12-02 (×2): 1 via TRANSDERMAL
  Filled 2013-11-30 (×2): qty 1

## 2013-11-30 MED ORDER — GABAPENTIN 600 MG PO TABS
600.0000 mg | ORAL_TABLET | Freq: Three times a day (TID) | ORAL | Status: DC
  Administered 2013-11-30 – 2013-12-02 (×6): 600 mg via ORAL
  Filled 2013-11-30 (×6): qty 1

## 2013-11-30 MED ORDER — OXYCODONE HCL ER 15 MG PO T12A
15.0000 mg | EXTENDED_RELEASE_TABLET | Freq: Three times a day (TID) | ORAL | Status: DC
  Administered 2013-11-30 – 2013-12-02 (×5): 15 mg via ORAL
  Filled 2013-11-30 (×5): qty 1

## 2013-11-30 NOTE — Progress Notes (Signed)
Subjective: Better night last night, she has pain both in back and legs, flexeril seems to reliably relieve spasm, toradol very effective for acute pain.  Objective: Vital signs in last 24 hours: Temp:  [97.5 F (36.4 C)-98.3 F (36.8 C)] 97.8 F (36.6 C) (07/08 0604) Pulse Rate:  [63-80] 63 (07/08 0604) Resp:  [18-23] 18 (07/08 0604) BP: (95-146)/(54-81) 106/66 mmHg (07/08 0604) SpO2:  [91 %-100 %] 97 % (07/08 0604) Weight change:  Last BM Date: 11/27/13  Intake/Output from previous day: 07/07 0701 - 07/08 0700 In: 300 [P.O.:300] Out: -  Intake/Output this shift:    General appearance: alert and cooperative  Lab Results:  Recent Labs  11/28/13 0850 11/29/13 1030  WBC 5.6 7.5  HGB 10.6* 9.8*  HCT 33.7* 30.2*  PLT 170 199   BMET  Recent Labs  11/28/13 0850 11/29/13 1030  NA 141  --   K 3.9  --   CL 105  --   CO2 23  --   GLUCOSE 134*  --   BUN 9  --   CREATININE 0.80 0.77  CALCIUM 8.9  --     Studies/Results: No results found.  Medications: I have reviewed the patient's current medications.  Assessment/Plan: Principal Problem:   Lumbar discitis/Severe back pain  Will continue antibiotics and start long acting narcotics, scheduled flexeril and NSAID(toradol for 24-48 hours which has been effective for her), continue gabapentin, and use oxycodone for breakthrough pain Active Problems:   Hypertension holding bp meds for low pressures.   LOS: 1 day   Clessie Karras JOSEPH 11/30/2013, 7:57 AM

## 2013-11-30 NOTE — Progress Notes (Signed)
UR complete.  Keziah Avis RN, MSN 

## 2013-11-30 NOTE — Consult Note (Signed)
Patient [id number] Botello      DOB: 05/12/1956      BJY:[id number]  Met with Chloe Johnson this afternoon for pain management.  Full note to follow.  Briefly, Chloe Johnson complains of mixed pain (nociceptive and neuropathic) most likely 2/2 discitis for which she is undergoing tx with abx.  Having end dose failure with oxycontin after about 8-9h.  Good relief from PRN percocet.  Most of pain is with activity.  Will start with changing oxycontin to $RemoveBefo'15mg'[id number]$  q8h to see if we can combat end dose failure Will try lidoderm patch to lumbar area affected.   May need further adjustment from there.  Will continue to follow along with you.   [name] D.O. Palliative Medicine Team at [facility]  Team Phone: [phone]

## 2013-11-30 NOTE — Consult Note (Signed)
Patient ZO:XWRU:Chloe Johnson      DOB: [birth year]      EAV:[id number]     Consult Note from the Palliative Medicine Team at [facility]    Consult Requested by: Dr Valentina LucksGriffin     PCP: Lillia MountainGRIFFIN,JOHN JOSEPH, MD Reason for Consultation: Acute Pain Management    Phone [phone]  Assessment of patients Current state: 58 yo female with chronic degenrative disc disease of back complicated by recent diagnosis of discitis after increasing pain in April 2015.  Palliative Care consulted for pain managment   1.  Mixed Pain 2/2 discitis- Certainly describing picture of both nociceptive as well as neuropathic pain.  Currently on Oxycontin 20mg  BID and Percocet 5mg  PRN.  Other adjuvants include neurontin 600mg  TID, flexeril 10mg  q8h, toradol 30mg  q6h.  Has tried steroids in past without relief. Sedation major issue with diazepam and MSIR at home.  - Will change oxcontin to 15mg  q8h to see if we can fix her end dose failure after ~8-9h - Lidoderm Path to lumbar spine - Neurpathic and incidental pain difficult for her to control. We can also consider low dose baclofen.  Hopefully will see improvement in coming days with abx.    2. Opioid Induced constiptaion- Appropriately on doc/senna here. Having issues at home with diarrhea occuring from trying to catch up with PRN stool softeners. Monitor closely while here. Can also receive PRN miralax.   3. 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 Lives at home with husband.       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 Worsening back pain since April 2015 with resultant functional impairment. Was receiving steroids injections for time with some releif in addition to adjuvant pain medications including neurontin and flexeril.  Pain continued to worsen and resultant work-up revealed suspected discitis. Subsequent cultures of this area have not revealed organisms.   Evaluated by ID and currently on Vanc/Levaquin. Pain is sharp in lower back with radiation into bilateral lower ext.  Often associated with "shock-like" sensation.  Positional changes can make worse.  Also notes that pain is often worse when ambulating. Sometimes there are associated muscle spasms as well. Has tried multiple medications for pain.  Was on MSIR 15mg  PRN as outpatient. Reports taking 4-6 pills per day but had trouble with drowsiness. Also was on diazepam which caused significant drowisness and confusion. Was on steroids in past but did not feel like they helped much. Has gotten some relief from gabapentin as well as flexeril.  Pain has lead to several admissions in past few weeks. Actually was discharged from hospital yesterday.  Has also had some issues with constipation 2/2 opioids.  Has bowel regimen at home. When trying to catch up to constipation can have severe diarrhea.  Appetite good. Sleeping much better today. Has noted improvement in mentation and pain control with switch to oxycontin here. Denies F/C, focal weakness, bowel/bladder incontinence.      QMV:HQIOROS:Full ROS negative unless otherwise mentioned above.     PMH:  Past Medical History  Diagnosis Date  . Hypertension   . Neck pain   . Anxiety   . Kidney stones     "have always passed them"  . Exertional asthma   . GERD (gastroesophageal reflux disease)   . Back pain   . Discitis of lumbar region 11/16/2013    L3-4/notes 11/24/2013  . Osteomyelitis 11/2013  PSH: Past Surgical History  Procedure Laterality Date  . Tonsillectomy and adenoidectomy  1975  . Tubal ligation  1999  . Breast cyst excision Left 2010    "polypectomy"   I have reviewed the FH and SH and  If appropriate update it with new information. Allergies  Allergen Reactions  . Erythromycin Nausea And Vomiting    N+V  . Latex Other (See Comments)    Irritation of the skin  . Shellfish Allergy Other (See Comments)    GI- Facial  Acne  .  Strawberry Other (See Comments)    Mouth ulcers   . Vicodin [Hydrocodone-Acetaminophen] Other (See Comments)    Restless, tolerate percocet  . Keflex [Cephalexin] Rash  . Penicillins Rash    Hives  . Sulfa Antibiotics Rash    Hives   Scheduled Meds: . cyclobenzaprine  10 mg Oral 3 times per day  . enoxaparin (LOVENOX) injection  40 mg Subcutaneous Q24H  . FLUoxetine  20 mg Oral Daily  . gabapentin  600 mg Oral 3 times per day  . ketorolac  30 mg Intravenous 4 times per day  . levofloxacin  750 mg Oral QAC breakfast  . lidocaine  1 patch Transdermal Q24H  . OxyCODONE  15 mg Oral 3 times per day  . pantoprazole  80 mg Oral Q1200  . senna-docusate  2 tablet Oral QHS  . vancomycin (VANCOCIN) 1250 mg IVPB  1,250 mg Intravenous Q12H   Continuous Infusions:  PRN Meds:.acetaminophen, acetaminophen, alum & mag hydroxide-simeth, loratadine, ondansetron (ZOFRAN) IV, ondansetron, oxyCODONE-acetaminophen, polyethylene glycol, triamcinolone    BP 90/51  Pulse 98  Temp(Src) 97.7 F (36.5 C) (Oral)  Resp 18  Ht 5\' 1"  (1.549 m)  Wt 74.39 kg (164 lb)  BMI 31.00 kg/m2  SpO2 95%     Intake/Output Summary (Last 24 hours) at 11/30/13 2152 Last data filed at 11/30/13 1846  Gross per 24 hour  Intake    400 ml  Output      0 ml  Net    400 ml   Physical Exam:  General: Alert, NAD HEENT:  [place], mmm Chest:   CTAB, symm expan CVS: RRR Abdomen:soft, NT, ND, +BS Ext: no c/c/e Neuro: moves all extremities.  Mild limitation in dorsiflexion of right foot 2/2 pain. Otherwise strength symmetrical and intact in lower ext.  Sensation grossly in tact.   Labs: CBC    Component Value Date/Time   WBC 7.5 11/29/2013 1030   RBC 3.56* 11/29/2013 1030   HGB 9.8* 11/29/2013 1030   HCT 30.2* 11/29/2013 1030   PLT 199 11/29/2013 1030   MCV 84.8 11/29/2013 1030   MCH 27.5 11/29/2013 1030   MCHC 32.5 11/29/2013 1030   RDW 13.1 11/29/2013 1030   LYMPHSABS 1.2 11/28/2013 0850   MONOABS 0.3 11/28/2013 0850   EOSABS 0.2  11/28/2013 0850   BASOSABS 0.0 11/28/2013 0850    BMET    Component Value Date/Time   NA 141 11/28/2013 0850   K 3.9 11/28/2013 0850   CL 105 11/28/2013 0850   CO2 23 11/28/2013 0850   GLUCOSE 134* 11/28/2013 0850   BUN 9 11/28/2013 0850   CREATININE 0.77 11/29/2013 1030   CALCIUM 8.9 11/28/2013 0850   GFRNONAA >90 11/29/2013 1030   GFRAA >90 11/29/2013 1030    CMP     Component Value Date/Time   NA 141 11/28/2013 0850   K 3.9 11/28/2013 0850   CL 105 11/28/2013 0850   CO2 23 11/28/2013  0850   GLUCOSE 134* 11/28/2013 0850   BUN 9 11/28/2013 0850   CREATININE 0.77 11/29/2013 1030   CALCIUM 8.9 11/28/2013 0850   PROT 6.7 11/17/2013 0345   ALBUMIN 3.3* 11/17/2013 0345   AST 16 11/17/2013 0345   ALT 24 11/17/2013 0345   ALKPHOS 97 11/17/2013 0345   BILITOT <0.2* 11/17/2013 0345   GFRNONAA >90 11/29/2013 1030   GFRAA >90 11/29/2013 1030   09/16/13 MRI LUMBAR SPINE IMPRESSION:  1. At L2-3 there is a mild broad-based disc bulge with flattening of  the ventral thecal sac. Moderate left facet arthropathy with  ligamentum flavum infolding resulting in left lateral recess  stenosis.  2. At L3-4 there is a moderate broad-based disc bulge with a left  far lateral disc component in close proximity to the left extra  foraminal L3 nerve root. Moderate left facet arthropathy. Severe  left and moderate right lateral recess stenosis. Mild spinal  stenosis. Mild left foraminal stenosis.  3. At L4-5 there is a moderate broad-based disc osteophyte complex  with flattening of the ventral thecal sac. Bilateral mild facet  arthropathy. Moderate lateral recess stenosis. Moderate right  foraminal stenosis.   Greater than 50%  of this time was spent counseling and coordinating care related to the above assessment and plan.

## 2013-12-01 MED ORDER — NAPROXEN 250 MG PO TABS
500.0000 mg | ORAL_TABLET | Freq: Two times a day (BID) | ORAL | Status: DC
  Administered 2013-12-01 – 2013-12-02 (×3): 500 mg via ORAL
  Filled 2013-12-01 (×3): qty 2

## 2013-12-01 MED ORDER — PANTOPRAZOLE SODIUM 40 MG PO TBEC
40.0000 mg | DELAYED_RELEASE_TABLET | Freq: Every day | ORAL | Status: DC
  Administered 2013-12-01 – 2013-12-02 (×2): 40 mg via ORAL
  Filled 2013-12-01 (×2): qty 1

## 2013-12-01 NOTE — Evaluation (Signed)
Physical Therapy Evaluation Patient Details Name: Chloe Johnson MRN: [id number] DOB: 1956-02-22 Today's Date: 12/01/2013   History of Present Illness  58 yo female with chronic degenrative disc disease of back complicated by recent diagnosis of discitis after increasing pain in April 2015. Admitted on 7/2 and discharged on 7/6 on vanc, Levaquin, Percocet and Flexeril. pt was re-admitted due to pain control issues.  Palliative Care consulted for pain managment.  Clinical Impression  Pt adm from home due to above. Presents to be at supervision level for mobility at this time. Pt continues to be limited by intermittent muscle spasms in bil hips. Family very supportive and present during all education regarding stair management and mobility at this time. Feel pt is safe to ambulate with family and nursing staff as tolerated with RW. No further acute PT needs warranted at this time.     Follow Up Recommendations Home health PT;Supervision/Assistance - 24 hour    Equipment Recommendations  None recommended by PT    Recommendations for Other Services OT consult     Precautions / Restrictions Precautions Precautions: Fall Precaution Comments: pt and family educated on log rolling technique  Restrictions Weight Bearing Restrictions: No      Mobility  Bed Mobility Overal bed mobility: Needs Assistance Bed Mobility: Sit to Sidelying         Sit to sidelying: Min assist General bed mobility comments: family member able to (A) pt properly and was encouraging log rolling technique for pt;  Transfers Overall transfer level: Modified independent Equipment used: Rolling walker (2 wheeled) Transfers: Sit to/from Stand Sit to Stand: Modified independent (Device/Increase time)         General transfer comment: pt able to transfer to standing from toilet and bed without any LOB noted; good use of hand placement and safety awareness by daughter to instruct pt   Ambulation/Gait Ambulation/Gait  assistance: Supervision Ambulation Distance (Feet): 220 Feet Assistive device: Rolling walker (2 wheeled) Gait Pattern/deviations: Step-through pattern;Decreased stride length;Antalgic Gait velocity: decreased due to c/o intermittent spasms in Lt hip  Gait velocity interpretation: Below normal speed for age/gender General Gait Details: supervision for safety; no LOB noted when ambulating with RW; educated pt and family member to provide 24/7 (A) for all mobility at this time and to use RW at all times   Stairs Stairs: Yes Stairs assistance: Supervision Stair Management: One rail Right;Forwards;Step to pattern Number of Stairs: 3 General stair comments: cues for sequencing and safety with stair management; educated daughter on technique; pt demo good teachback ability and ablity to recall instructions   Wheelchair Mobility    Modified Rankin (Stroke Patients Only)       Balance Overall balance assessment: Needs assistance Sitting-balance support: No upper extremity supported;Feet supported Sitting balance-Leahy Scale: Good     Standing balance support: During functional activity;No upper extremity supported Standing balance-Leahy Scale: Fair Standing balance comment: able to stand at sink with supervison                              Pertinent Vitals/Pain C/o intermittent muscle spasms in bil hips. RN made aware    Home Living Family/patient expects to be discharged to:: Private residence Living Arrangements: Spouse/significant other Available Help at Discharge: Family;Available 24 hours/day Type of Home: House Home Access: Stairs to enter Entrance Stairs-Rails: Right;Can reach Software engineer of Steps: 4 Home Layout: Two level;Bed/bath upstairs Home Equipment: Walker - 2 wheels Additional Comments: familly  with great family support and has had 24/7 (A) from husband and children     Prior Function Level of Independence: Independent          Comments: prior to recent admissions; since recent admissions, she has been requiring incr (A)      Hand Dominance   Dominant Hand: Right    Extremity/Trunk Assessment   Upper Extremity Assessment: Overall WFL for tasks assessed           Lower Extremity Assessment: Overall WFL for tasks assessed (has spasms at times limiting strength)      Cervical / Trunk Assessment: Normal  Communication   Communication: No difficulties  Cognition Arousal/Alertness: Awake/alert Behavior During Therapy: Impulsive Overall Cognitive Status: Impaired/Different from baseline Area of Impairment: Memory     Memory: Decreased short-term memory         General Comments: pt with decr short term memory recently; believed to be due to medications     General Comments      Exercises        Assessment/Plan    PT Assessment Patent does not need any further PT services;All further PT needs can be met in the next venue of care  PT Diagnosis Abnormality of gait;Generalized weakness;Acute pain   PT Problem List Decreased strength;Decreased activity tolerance;Decreased balance;Pain;Decreased cognition;Decreased mobility  PT Treatment Interventions     PT Goals (Current goals can be found in the Care Plan section) Acute Rehab PT Goals PT Goal Formulation: No goals set, d/c therapy    Frequency     Barriers to discharge        Co-evaluation               End of Session Equipment Utilized During Treatment: Gait belt Activity Tolerance: Patient tolerated treatment well Patient left: in bed;with call bell/phone within reach;with family/visitor present Nurse Communication: Mobility status         Time: 1126-1150 PT Time Calculation (min): 24 min   Charges:   PT Evaluation $Initial PT Evaluation Tier I: 1 Procedure $PT Re-evaluation: 1 Procedure PT Treatments $Gait Training: 8-22 mins   PT G CodesGustavus Bryant , [place]  [phone]  12/01/2013, 12:14  PM

## 2013-12-01 NOTE — Progress Notes (Signed)
Subjective: Good night last night.  Objective: Vital signs in last 24 hours: Temp:  [97.7 F (36.5 C)-98.2 F (36.8 C)] 97.8 F (36.6 C) (07/09 0529) Pulse Rate:  [73-98] 87 (07/09 0529) Resp:  [18] 18 (07/09 0529) BP: (90-135)/(51-85) 104/67 mmHg (07/09 0529) SpO2:  [94 %-97 %] 96 % (07/09 0529) Weight change:  Last BM Date: 11/27/13  Intake/Output from previous day: 07/08 0701 - 07/09 0700 In: 400 [P.O.:400] Out: -  Intake/Output this shift:    General appearance: alert and cooperative  Lab Results:  Recent Labs  11/28/13 0850 11/29/13 1030  WBC 5.6 7.5  HGB 10.6* 9.8*  HCT 33.7* 30.2*  PLT 170 199   BMET  Recent Labs  11/28/13 0850 11/29/13 1030  NA 141  --   K 3.9  --   CL 105  --   CO2 23  --   GLUCOSE 134*  --   BUN 9  --   CREATININE 0.80 0.77  CALCIUM 8.9  --     Studies/Results: No results found.  Medications: I have reviewed the patient's current medications.  Assessment/Plan: Principal Problem:  Lumbar discitis/Severe back pain.  Appreciated palliative care help. Will continue antibiotics, oxycontin now q8, scheduled flexeril and change toradol to naproxen, continue gabapentin, and use oxycodone for breakthrough pain.  lidoderm patch added. Have PT see her today, monitor for over sedation and hope to discharge in am Active Problems:  Hypertension holding bp meds for low pressures    LOS: 2 days   Chloe Johnson JOSEPH 12/01/2013, 7:58 AM

## 2013-12-01 NOTE — Progress Notes (Addendum)
Patient QA:ESLP Chloe Johnson      DOB: [birth year]      NPY:[id number]   Palliative Medicine Team at [facility] Progress Note    Subjective: Pain doing much better today.  Was able to ambulate to bathroom as well without significant distress.  Still feels like thinking is a little cloudy, but much better than when she was at home previously.  Just put lidoderm patch on this morning, so unclear how much it has helped thus far. Has not needed PRN percocet since last night.  Overall happy with how she is feeling currently and hopes to go home soon.  PT to see this afternoon. She took dulcolax to help prevent constipation today. Slept well overnight    Filed Vitals:   12/01/13 0941  BP: 128/78  Pulse: 72  Temp: 97.9 F (36.6 C)  Resp: 20   Physical exam: GEN: A+O, NAD HEENT: [place], mmm CV: RRR LUNGS: CTAB, symm expansion ABD: soft, NT, ND EXT: no c/c/e NEURO: dorsifelxion remains 4/5 on right but 5/5 and symetrical through rest of lower ext.      Assessment and plan: 58 yo female with chronic degenrative disc disease of back complicated by recent diagnosis of discitis after increasing pain in April 2015. Palliative Care consulted for pain managment  1. Mixed Pain 2/2 discitis- Certainly describing picture of both nociceptive as well as neuropathic pain.  - Continue oxycontin 15mg  BID and PRN oxycodone. If sedation issue today can change oxycontin to 10mg  QAM/afternoon and 15mg  qPM.  - Agree with transition from toradol to naproxyn - Lidoderm Path to lumbar spine  - Abx and other adjuvants as charted.  2. Opioid Induced constiptaion- Appropriately on doc/senna here. Having issues at home with diarrhea occuring from trying to catch up with PRN stool softeners. Monitor closely while here. Can also receive PRN miralax.  3. 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 Lives at home with husband.   Orvis Brill D.O. Palliative Medicine Team at  [facility]  Team Phone: [phone]

## 2013-12-02 MED ORDER — NAPROXEN 500 MG PO TABS: 500.0000 mg | ORAL_TABLET | Freq: Two times a day (BID) | ORAL | Status: DC

## 2013-12-02 MED ORDER — HEPARIN SOD (PORK) LOCK FLUSH 100 UNIT/ML IV SOLN
250.0000 [IU] | INTRAVENOUS | Status: AC | PRN
  Administered 2013-12-02: 250 [IU]

## 2013-12-02 MED ORDER — CYCLOBENZAPRINE HCL 10 MG PO TABS: 10.0000 mg | ORAL_TABLET | Freq: Three times a day (TID) | ORAL | Status: DC

## 2013-12-02 MED ORDER — LIDOCAINE 5 % EX PTCH: 1.0000 | MEDICATED_PATCH | CUTANEOUS | Status: DC

## 2013-12-02 MED ORDER — OXYCODONE HCL ER 15 MG PO T12A: 15.0000 mg | EXTENDED_RELEASE_TABLET | Freq: Three times a day (TID) | ORAL | Status: DC

## 2013-12-02 MED ORDER — SENNOSIDES-DOCUSATE SODIUM 8.6-50 MG PO TABS: 2.0000 | ORAL_TABLET | Freq: Every day | ORAL | Status: DC

## 2013-12-02 MED ORDER — GABAPENTIN 600 MG PO TABS: 600.0000 mg | ORAL_TABLET | Freq: Three times a day (TID) | ORAL | Status: DC

## 2013-12-02 NOTE — Progress Notes (Signed)
[facility] called concerning discharge home today with resumption of IV antibiotics and HHPT; B Shelba Flake [phone]

## 2013-12-02 NOTE — Progress Notes (Signed)
Patient d/c home, D/c instruction given, patient verbalized understanding. Condition stale

## 2013-12-06 NOTE — Discharge Summary (Signed)
Physician Discharge Summary  Patient ID: Chloe Johnson MRN: [id number] DOB/AGE: [birth year] 58 y.o.  Admit date: 11/29/2013 Discharge date: 12/06/2013  Admission Diagnoses: Lumbar discitis Severe low back pain  Discharge Diagnoses:  Principal Problem:   Lumbar discitis Active Problems:  Severe low back pain  Discharged Condition: good  Hospital Course: The patient was admitted with severe low back pain for pain control. He had been discharged the day before on IV vancomycin, oral Levaquin and multiple pain medications. She had uncontrolled pain throughout the night and was readmitted. She had OxyContin added to her regimen. She was given oxycodone for breakthrough pain. Flexeril and gabapentin were given on scheduled basis. She was given Toradol IV x4 doses per 24 hours and then switched to naproxen. Lidoderm was added. She was seen by the palliative care service who helped in adjusting her pain regimen. OxyContin was changed to every 8 hours. By discharge she had better pain control. She was continued on antibiotics. She was having no constipation. Oversedation was not a significant issue. She was discharged in better condition  Consults:  palliative care  Significant Diagnostic Studies: labs: WBC 7.5 hemoglobin 9.8, sodium 149, potassium 3.9, chloride 105, bicarbonate 23, BUN 9, creatinine 0.8  Treatments: IV hydration, antibiotics: vancomycin and Levaquin and analgesia: OxyContin, oxycodone, gabapentin, Lidoderm, Toradol, naproxen, Flexeril  Discharge Exam: Blood pressure 138/82, pulse 91, temperature 97.5 F (36.4 C), temperature source Oral, resp. rate 18, height 5\' 1"  (1.549 m), weight 74.39 kg (164 lb), SpO2 94.00%. General appearance: alert and cooperative Resp: clear to auscultation bilaterally Cardio: regular rate and rhythm, S1, S2 normal, no murmur, click, rub or gallop  Disposition: 01-Home or Self Care     Medication List                  cyclobenzaprine 10 MG  tablet  Commonly known as:  FLEXERIL  Take 1 tablet (10 mg total) by mouth 3 (three) times daily.     esomeprazole 40 MG capsule  Commonly known as:  NEXIUM  Take 40 mg by mouth daily as needed (for acid reflux).     EVENING PRIMROSE OIL PO  Take 1,000 mg by mouth 2 (two) times daily.     Fish Oil 1200 MG Caps  Take 1,200 mg by mouth 2 (two) times daily.     FLUoxetine 20 MG capsule  Commonly known as:  PROZAC  Take 20 mg by mouth daily.     gabapentin 600 MG tablet  Commonly known as:  NEURONTIN  Take 1 tablet (600 mg total) by mouth 3 (three) times daily.     levofloxacin 750 MG tablet  Commonly known as:  LEVAQUIN  Take 1 tablet (750 mg total) by mouth daily.     lidocaine 5 %  Commonly known as:  LIDODERM  Place 1 patch onto the skin daily. Remove & Discard patch within 12 hours or as directed by MD     loratadine 10 MG tablet  Commonly known as:  CLARITIN  Take 10 mg by mouth daily as needed for allergies.     naproxen 500 MG tablet  Commonly known as:  NAPROSYN  Take 1 tablet (500 mg total) by mouth 2 (two) times daily with a meal.     NASACORT ALLERGY 24HR 55 MCG/ACT Aero nasal inhaler  Generic drug:  triamcinolone  Place 1 spray into both nostrils daily as needed (for allergies).     OVER THE COUNTER MEDICATION  Take 1 tablet by mouth  daily. "Estrovera"     OVER THE COUNTER MEDICATION  Take 1-3 tablets by mouth daily as needed (for constipation). "Super Cleanse"     OxyCODONE 15 mg T12a 12 hr tablet  Commonly known as:  OXYCONTIN  Take 1 tablet (15 mg total) by mouth every 8 (eight) hours.     oxyCODONE-acetaminophen 5-325 MG per tablet  Commonly known as:  PERCOCET  1 to 2 tablets every 6 hours as needed for pain.     polyethylene glycol packet  Commonly known as:  MIRALAX / GLYCOLAX  Take 17 g by mouth daily as needed for mild constipation.     senna-docusate 8.6-50 MG per tablet  Commonly known as:  Senokot-S  Take 2 tablets by mouth at  bedtime.     vancomycin 1,250 mg in sodium chloride 0.9 % 250 mL  Inject 1,250 mg into the vein every 12 (twelve) hours.         SignedLillia Mountain 12/06/2013, 6:44 AM

## 2013-12-19 ENCOUNTER — Telehealth: Payer: Self-pay | Admitting: Licensed Clinical Social Worker

## 2013-12-19 NOTE — Telephone Encounter (Signed)
[facility] from [facility] called to report redness at the picc line entry site. RN denies seeing swelling, drainage or fever. Patient denies pain. RN wanted Provider to know.

## 2013-12-19 NOTE — Telephone Encounter (Signed)
RN Chloe Johnson has already left from her home, but she will go back out tomorrow and send you the picture.

## 2013-12-19 NOTE — Telephone Encounter (Signed)
Can you ask the RN to take a picture of it and send it to my cell phone [phone] and see her again this week?

## 2013-12-20 ENCOUNTER — Telehealth: Payer: Self-pay | Admitting: *Deleted

## 2013-12-20 NOTE — Telephone Encounter (Signed)
Called patient to r/s appointment from 12/30/13, patient states that she has been seeing a neurosurgeon for her condition and doesn't feel an appointment is needed at the time, states she will call back if anything changes.

## 2013-12-20 NOTE — Telephone Encounter (Signed)
RN from [facility] called patient to go out today to check Picc line site, but the patient stated that she was going to Dr. Jone Baseman office today for an appointment and she would get him to look at it. Donita, RN stated that she would follow up with the patient after her appointment

## 2013-12-21 ENCOUNTER — Other Ambulatory Visit: Payer: Self-pay | Admitting: Neurological Surgery

## 2013-12-21 DIAGNOSIS — M519 Unspecified thoracic, thoracolumbar and lumbosacral intervertebral disc disorder: Secondary | ICD-10-CM

## 2013-12-21 LAB — FUNGUS CULTURE W SMEAR: Smear Result: NONE SEEN

## 2013-12-23 ENCOUNTER — Ambulatory Visit (INDEPENDENT_AMBULATORY_CARE_PROVIDER_SITE_OTHER): Payer: Managed Care, Other (non HMO) | Admitting: Internal Medicine

## 2013-12-23 ENCOUNTER — Encounter: Payer: Self-pay | Admitting: Internal Medicine

## 2013-12-23 VITALS — BP 115/78 | HR 68 | Temp 98.2°F

## 2013-12-23 DIAGNOSIS — M4646 Discitis, unspecified, lumbar region: Secondary | ICD-10-CM

## 2013-12-23 DIAGNOSIS — M519 Unspecified thoracic, thoracolumbar and lumbosacral intervertebral disc disorder: Secondary | ICD-10-CM

## 2013-12-26 NOTE — Progress Notes (Signed)
RFV: hospital follow up for discitis/osteomyelitis of lumbar spine  Subjective:    Patient ID: Chloe Johnson, female    DOB: 1956/03/02, 58 y.o.   MRN: [id number]  HPI Samella Sisko is a 58 y.o. female with chronic pain of neck and back with worsening back pain over several months of lumbar region and MRI done as an outpatient showed L3-L4 discitis, although reports have not been accessible in [facility], did have MRI in May that had no signs of discitis. She has had aspiration x 2 and some WBCs noted 1st one, none on aspiration done by IR on July 2nd. Cultures were no growth to date. No fever, no chills. Blood culture also remained negative. She was empirically treated for culture negative discitis with 6 wk course of vancomycin and levofloxacin, using July 2nd as day 1 of antibiotics. She presents today in follow-up. She is doing well with taking the antibiotics no difficulty.  Current Outpatient Prescriptions on File Prior to Visit  Medication Sig Dispense Refill  . cyclobenzaprine (FLEXERIL) 10 MG tablet Take 1 tablet (10 mg total) by mouth 3 (three) times daily.  90 tablet  5  . esomeprazole (NEXIUM) 40 MG capsule Take 40 mg by mouth daily as needed (for acid reflux).       Marland Kitchen EVENING PRIMROSE OIL PO Take 1,000 mg by mouth 2 (two) times daily.      Marland Kitchen FLUoxetine (PROZAC) 20 MG capsule Take 20 mg by mouth daily.       Marland Kitchen gabapentin (NEURONTIN) 600 MG tablet Take 1 tablet (600 mg total) by mouth 3 (three) times daily.  90 tablet  5  . levofloxacin (LEVAQUIN) 750 MG tablet Take 1 tablet (750 mg total) by mouth daily.  39 tablet  0  . lidocaine (LIDODERM) 5 % Place 1 patch onto the skin daily. Remove & Discard patch within 12 hours or as directed by MD  30 patch  5  . loratadine (CLARITIN) 10 MG tablet Take 10 mg by mouth daily as needed for allergies.      . naproxen (NAPROSYN) 500 MG tablet Take 1 tablet (500 mg total) by mouth 2 (two) times daily with a meal.  60 tablet  5  . Omega-3 Fatty Acids (FISH OIL)  1200 MG CAPS Take 1,200 mg by mouth 2 (two) times daily.      Marland Kitchen OVER THE COUNTER MEDICATION Take 1 tablet by mouth daily. "Estrovera"      . OVER THE COUNTER MEDICATION Take 1-3 tablets by mouth daily as needed (for constipation). "Super Cleanse"      . OxyCODONE (OXYCONTIN) 15 mg T12A 12 hr tablet Take 1 tablet (15 mg total) by mouth every 8 (eight) hours.  90 tablet  0  . oxyCODONE-acetaminophen (PERCOCET) 5-325 MG per tablet 1 to 2 tablets every 6 hours as needed for pain.  60 tablet  0  . polyethylene glycol (MIRALAX / GLYCOLAX) packet Take 17 g by mouth daily as needed for mild constipation.  14 each  0  . senna-docusate (SENOKOT-S) 8.6-50 MG per tablet Take 2 tablets by mouth at bedtime.  60 tablet  5  . triamcinolone (NASACORT ALLERGY 24HR) 55 MCG/ACT AERO nasal inhaler Place 1 spray into both nostrils daily as needed (for allergies).      . vancomycin 1,250 mg in sodium chloride 0.9 % 250 mL Inject 1,250 mg into the vein every 12 (twelve) hours.  17500 mg  5   No current facility-administered medications on file prior  to visit.   Active Ambulatory Problems    Diagnosis Date Noted  . Hypertension   . Asthma   . Anxiety   . Osteomyelitis of lumbar spine 11/16/2013  . Diarrhea 11/17/2013  . Discitis 11/24/2013  . Lumbar discitis 11/29/2013   Resolved Ambulatory Problems    Diagnosis Date Noted  . Possible Bilateral cellulitis of lower leg 11/17/2013  . Discitis of lumbar region 11/24/2013   Past Medical History  Diagnosis Date  . Neck pain   . Kidney stones   . Exertional asthma   . GERD (gastroesophageal reflux disease)   . Back pain   . Osteomyelitis 11/2013     History  Substance Use Topics  . Smoking status: Never Smoker   . Smokeless tobacco: Never Used  . Alcohol Use: 3.6 oz/week    3 Glasses of wine, 3 Shots of liquor per week     Review of Systems 10 point ros is negative upon review    Objective:   Physical Exam BP 115/78  Pulse 68  Temp(Src) 98.2 F  (36.8 C) (Oral) Physical Exam  Constitutional:  oriented to person, place, and time. appears well-developed and well-nourished. No distress.  HENT:  Mouth/Throat: Oropharynx is clear and moist. No oropharyngeal exudate.  Cardiovascular: Normal rate, regular rhythm and normal heart sounds. Exam reveals no gallop and no friction rub.  No murmur heard.  Pulmonary/Chest: Effort normal and breath sounds normal. No respiratory distress.  has no wheezes.  Back = no tenderness along spine Ext: picc line is c/d/i, no swelling Lymphadenopathy: no cervical adenopathy.  Neurological: alert and oriented to person, place, and time.  Skin: Skin is warm and dry. No rash noted. No erythema.  Psychiatric: a normal mood and affect.  behavior is normal.   Labs: Lab Results  Component Value Date   CRP 4.1* 11/24/2013   Lab Results  Component Value Date   ESRSEDRATE 64* 11/24/2013       Assessment & Plan:  Lumbar discitis/osteomyelitis = currently on day 30 of 42 of IV antibiotics. We will see her back in 10-14 days. Anticipate to recheck her inflammatory markers to decide if need to continue with oral suppressive therapy such as doxycycline. She has numerous other antibiotic allergies

## 2013-12-28 ENCOUNTER — Ambulatory Visit
Admission: RE | Admit: 2013-12-28 | Discharge: 2013-12-28 | Disposition: A | Discharge status: Home / Self Care | Payer: Managed Care, Other (non HMO) | Source: Ambulatory Visit | Attending: Neurological Surgery | Admitting: Neurological Surgery

## 2013-12-28 DIAGNOSIS — M519 Unspecified thoracic, thoracolumbar and lumbosacral intervertebral disc disorder: Secondary | ICD-10-CM

## 2013-12-28 MED ORDER — GADOBENATE DIMEGLUMINE 529 MG/ML IV SOLN
15.0000 mL | Freq: Once | INTRAVENOUS | Status: AC | PRN
  Administered 2013-12-28: 15 mL via INTRAVENOUS

## 2013-12-30 ENCOUNTER — Ambulatory Visit: Payer: Managed Care, Other (non HMO) | Admitting: Nurse Practitioner

## 2014-01-02 ENCOUNTER — Other Ambulatory Visit: Payer: Self-pay | Admitting: Internal Medicine

## 2014-01-02 ENCOUNTER — Telehealth: Payer: Self-pay | Admitting: *Deleted

## 2014-01-02 DIAGNOSIS — M4640 Discitis, unspecified, site unspecified: Secondary | ICD-10-CM

## 2014-01-02 NOTE — Telephone Encounter (Signed)
Called the patient and apologized for the time it has taken to have her PICC appt but unable to reach IR by phone today and will try tomorrow and if not able to will have Dr Drue Second call or we will look at ED vs admit to have PICC changed out. Either way will call her first thing tomorrow. Had to leave a message as she did not answer the phone.

## 2014-01-02 NOTE — Telephone Encounter (Signed)
Per verbal from Dr Drue Second patient is to have her PICC replaced asap. Will call and schedule it and call the patient with details.

## 2014-01-02 NOTE — Telephone Encounter (Signed)
Donita from [facility] called to advise that the patient PICC site is red swollen and painful. She also reports that the PICC is painful during infusion and is worried that it may need to come out. Advised the nurse to send Dr Drue Second a pic of the site and as soon as I hear from her I will call her back.

## 2014-01-02 NOTE — Progress Notes (Signed)
Was called by home health rn and texted me photo of erythematous picc line. Slight erythema but patient reports pain with infusion of vanco. Concern for malfunction. Patient still needs 1-3 wk left of iv antibiotics for discitis

## 2014-01-03 ENCOUNTER — Other Ambulatory Visit: Payer: Self-pay | Admitting: Internal Medicine

## 2014-01-03 ENCOUNTER — Ambulatory Visit (HOSPITAL_COMMUNITY)
Admission: RE | Admit: 2014-01-03 | Discharge: 2014-01-03 | Disposition: A | Discharge status: Home / Self Care | Payer: Managed Care, Other (non HMO) | Source: Ambulatory Visit | Attending: Internal Medicine | Admitting: Internal Medicine

## 2014-01-03 DIAGNOSIS — M4640 Discitis, unspecified, site unspecified: Secondary | ICD-10-CM

## 2014-01-03 DIAGNOSIS — Z452 Encounter for adjustment and management of vascular access device: Secondary | ICD-10-CM | POA: Insufficient documentation

## 2014-01-03 MED ORDER — CHLORHEXIDINE GLUCONATE 4 % EX LIQD
CUTANEOUS | Status: AC
  Filled 2014-01-03: qty 15

## 2014-01-03 NOTE — Telephone Encounter (Signed)
Called the patient and advised her to arrive at [facility] at 1230 to have it replaced.

## 2014-01-03 NOTE — Procedures (Signed)
Successful exchange of right arm PICC.  Tip in lower SVC and ready to use. No immediate complication.

## 2014-01-04 ENCOUNTER — Telehealth: Payer: Self-pay | Admitting: *Deleted

## 2014-01-04 NOTE — Telephone Encounter (Signed)
Stop Date for IV antibiotic?  Per 12/23/13 office note patient's last day of IV antibiotic was going to be 01/04/14.  Pt has a return appt for 01/18/14.  Received lab report from [facility] for 01/02/14.  Sed rate = 14, CRP 2.6.  MD please advise.

## 2014-01-05 ENCOUNTER — Other Ambulatory Visit: Payer: Self-pay | Admitting: *Deleted

## 2014-01-05 DIAGNOSIS — M4626 Osteomyelitis of vertebra, lumbar region: Secondary | ICD-10-CM

## 2014-01-05 MED ORDER — LEVOFLOXACIN 750 MG PO TABS: 750.0000 mg | ORAL_TABLET | Freq: Every day | ORAL | Status: DC

## 2014-01-05 NOTE — Telephone Encounter (Signed)
Per Dr. Johnnette Barrios at [facility] notified that patient's IV antibiotic is to be extended through 01/21/14. A sed rate should be obtained prior to this and then the picc line can be pulled. Also refill Levofloxacin for 1 month and this was sent to patient's pharmacy. Patient notified.

## 2014-01-05 NOTE — Telephone Encounter (Signed)
Patient called stating she has one pill left of the Levofloxacin and wants to know if this is to be continued. She would need refills sent to [facility].

## 2014-01-06 LAB — AFB CULTURE WITH SMEAR (NOT AT ARMC): Acid Fast Smear: NONE SEEN

## 2014-01-06 NOTE — Telephone Encounter (Signed)
Already spoke with Feliz Beam

## 2014-01-18 ENCOUNTER — Ambulatory Visit (INDEPENDENT_AMBULATORY_CARE_PROVIDER_SITE_OTHER): Payer: Managed Care, Other (non HMO) | Admitting: Internal Medicine

## 2014-01-18 ENCOUNTER — Encounter: Payer: Self-pay | Admitting: Internal Medicine

## 2014-01-18 VITALS — BP 118/80 | HR 91 | Temp 97.3°F | Wt 160.0 lb

## 2014-01-18 DIAGNOSIS — M519 Unspecified thoracic, thoracolumbar and lumbosacral intervertebral disc disorder: Secondary | ICD-10-CM

## 2014-01-18 DIAGNOSIS — M4646 Discitis, unspecified, lumbar region: Secondary | ICD-10-CM

## 2014-01-18 MED ORDER — DOXYCYCLINE HYCLATE 100 MG PO TABS: 100.0000 mg | ORAL_TABLET | Freq: Two times a day (BID) | ORAL | Status: DC

## 2014-01-18 NOTE — Progress Notes (Signed)
Subjective:    Patient ID: Chloe Johnson, female    DOB: [birth year], 58 y.o.   MRN: [id number]  HPI 58yo F with L3-L4 culture negative discitis started on vancomycin and levofloxacin since July 2nd. She has finished 8 wks of IV antibiotics. She states that her back pain is improved but still overall weakness. She denies having diarrhea or thrush from antibiotics but does report all over body aches, and fatigue with low energy. No fever, or chills. She sees Dr. Valentina Lucks every 2 weeks for update. He mentioned that her inflammatory markers are normal but she has not seen Dr. Yetta Barre as of yet.  Current Outpatient Prescriptions on File Prior to Visit  Medication Sig Dispense Refill  . cyclobenzaprine (FLEXERIL) 10 MG tablet Take 1 tablet (10 mg total) by mouth 3 (three) times daily.  90 tablet  5  . esomeprazole (NEXIUM) 40 MG capsule Take 40 mg by mouth daily as needed (for acid reflux).       Marland Kitchen EVENING PRIMROSE OIL PO Take 1,000 mg by mouth 2 (two) times daily.      Marland Kitchen FLUoxetine (PROZAC) 20 MG capsule Take 20 mg by mouth daily.       Marland Kitchen gabapentin (NEURONTIN) 600 MG tablet Take 1 tablet (600 mg total) by mouth 3 (three) times daily.  90 tablet  5  . levofloxacin (LEVAQUIN) 750 MG tablet Take 1 tablet (750 mg total) by mouth daily.  30 tablet  0  . lidocaine (LIDODERM) 5 % Place 1 patch onto the skin daily. Remove & Discard patch within 12 hours or as directed by MD  30 patch  5  . loratadine (CLARITIN) 10 MG tablet Take 10 mg by mouth daily as needed for allergies.      . naproxen (NAPROSYN) 500 MG tablet Take 1 tablet (500 mg total) by mouth 2 (two) times daily with a meal.  60 tablet  5  . Omega-3 Fatty Acids (FISH OIL) 1200 MG CAPS Take 1,200 mg by mouth 2 (two) times daily.      . OxyCODONE (OXYCONTIN) 15 mg T12A 12 hr tablet Take 1 tablet (15 mg total) by mouth every 8 (eight) hours.  90 tablet  0  . oxyCODONE-acetaminophen (PERCOCET) 5-325 MG per tablet 1 to 2 tablets every 6 hours as needed for  pain.  60 tablet  0  . vancomycin 1,250 mg in sodium chloride 0.9 % 250 mL Inject 1,250 mg into the vein every 12 (twelve) hours.  17500 mg  5  . OVER THE COUNTER MEDICATION Take 1 tablet by mouth daily. "Estrovera"      . OVER THE COUNTER MEDICATION Take 1-3 tablets by mouth daily as needed (for constipation). "Super Cleanse"      . polyethylene glycol (MIRALAX / GLYCOLAX) packet Take 17 g by mouth daily as needed for mild constipation.  14 each  0  . senna-docusate (SENOKOT-S) 8.6-50 MG per tablet Take 2 tablets by mouth at bedtime.  60 tablet  5  . triamcinolone (NASACORT ALLERGY 24HR) 55 MCG/ACT AERO nasal inhaler Place 1 spray into both nostrils daily as needed (for allergies).       No current facility-administered medications on file prior to visit.   Active Ambulatory Problems    Diagnosis Date Noted  . Hypertension   . Asthma   . Anxiety   . Osteomyelitis of lumbar spine 11/16/2013  . Diarrhea 11/17/2013  . Discitis 11/24/2013  . Lumbar discitis 11/29/2013   Resolved Ambulatory Problems  Diagnosis Date Noted  . Possible Bilateral cellulitis of lower leg 11/17/2013  . Discitis of lumbar region 11/24/2013   Past Medical History  Diagnosis Date  . Neck pain   . Kidney stones   . Exertional asthma   . GERD (gastroesophageal reflux disease)   . Back pain   . Osteomyelitis 11/2013       Review of Systems 10 point ros are negative except what is mentioned above    Objective:   Physical Exam BP 118/80  Pulse 91  Temp(Src) 97.3 F (36.3 C) (Oral)  Wt 160 lb (72.576 kg) Physical Exam  Constitutional:  oriented to person, place, and time. appears well-developed and well-nourished. No distress.  HENT:  Mouth/Throat: Oropharynx is clear and moist. No oropharyngeal exudate.  Neurological: alert and oriented to person, place, and time.  Skin: Skin is warm and dry. No rash noted. No erythema. Right arm picc line c/d/i Psychiatric: a normal mood and affect. behavior is  normal.   Labs: Lab Results  Component Value Date   ESRSEDRATE 64* 11/24/2013   Lab Results  Component Value Date   CRP 4.1* 11/24/2013    Mri 8/5: FINDINGS:  Continued bone marrow edema with enhancement L3 and L4 vertebral  bodies. Increasing irregularity of the L3 inferior and L4 superior  endplates reflect osteolysis related to L3-4 diskitis. Mild LEFT  psoas inflammation without abscess. Minor disc osteophyte complex at  L3-4 projects posteriorly in the midline, RIGHT greater than LEFT.  This narrows the subarticular and foraminal zones at the L3-4  interspace on the RIGHT, but is overall improved with regard to  stenosis compared with the examination in June, particularly with  regard to the small collection in the LEFT subarticular zone which  probably did represent an abscess at that time. There is also  improvement of the small RIGHT prevertebral/retroperitoneal fluid  collection observed previously, now inapparent.  Moderate spondylosis L4-5 is stable. Mild disc degeneration L2-3 is  stable.  IMPRESSION:  Improved paravertebral and left L3-4 epidural fluid  collections/abscesses following disc space aspiration and several  weeks of antibiotic therapy.  Increasing osteolysis of the L3/L4 endplates above and below L3-4  diskitis.  No new areas of abnormality concerning for discitis or osteomyelitis  elsewhere.        Assessment & Plan:  Culture negative discitis treated with 8 wk of IV antibiotics. We will have her transition to oral antibiotics with doxycycline  BID. Will stop fluoroquinolone due to myalgia and ? Possible tendonitis to see if that improves. Will pull picc line during this visit. Will check sed rate, crp, and ck today  Recommend that she follows up back with Dr. Yetta Barre to review further management of back pain.   we will anticipate to repeat back MRI in 8 wks to ensure that discitis appears improved from most recent imaging on 12/28/2013.  rtc in 4  wks  Cc: griffin

## 2014-01-18 NOTE — Progress Notes (Signed)
Per Dr Drue Second 35  cm  Single lumen Peripherally Inserted Central Catheter  removed from right basilic . No sutures present. Dressing was clean and dry . Area cleansed with chlorhexidine and petroleum dressing applied. Pt advised no heavy lifting with this arm, leave dressing for 24 hours and call the office if dressing becomes soaked with blood or sharp pain presents.  Pt tolerated procedure well.    Laurell Josephs, RN

## 2014-01-19 LAB — CK: Total CK: 49 U/L (ref 7–177)

## 2014-01-19 LAB — SEDIMENTATION RATE: Sed Rate: 10 mm/hr (ref 0–22)

## 2014-01-19 LAB — C-REACTIVE PROTEIN: CRP: 1.7 mg/dL — ABNORMAL HIGH (ref <0.60)

## 2014-01-20 ENCOUNTER — Telehealth: Payer: Self-pay | Admitting: *Deleted

## 2014-01-20 NOTE — Telephone Encounter (Signed)
Patient has finished IV abx, had the PICC pulled in office at last visit.  Donita, [facility] RN, would like to know if her nursing visits should be discontinued as well. Please advise. Andree Coss, RN

## 2014-01-23 ENCOUNTER — Telehealth: Payer: Self-pay | Admitting: *Deleted

## 2014-01-23 NOTE — Telephone Encounter (Signed)
Patient called stating she can not tolerate the doxycycline; c/o extreme nausea even with taking the zofran. Please advise

## 2014-01-25 NOTE — Telephone Encounter (Signed)
Patient notified per Dr. Drue Second that she should try to continue to take the doxycycline. If she absolutely can not tolerate it she should then discontinue. Patient agreed Wendall Mola

## 2014-01-27 ENCOUNTER — Other Ambulatory Visit: Payer: Self-pay | Admitting: Internal Medicine

## 2014-02-15 ENCOUNTER — Encounter: Payer: Self-pay | Admitting: Internal Medicine

## 2014-02-15 ENCOUNTER — Ambulatory Visit (INDEPENDENT_AMBULATORY_CARE_PROVIDER_SITE_OTHER): Payer: Managed Care, Other (non HMO) | Admitting: Internal Medicine

## 2014-02-15 VITALS — BP 134/79 | HR 97 | Temp 98.3°F | Ht 61.0 in | Wt 163.8 lb

## 2014-02-15 DIAGNOSIS — M4645 Discitis, unspecified, thoracolumbar region: Secondary | ICD-10-CM

## 2014-02-15 DIAGNOSIS — M519 Unspecified thoracic, thoracolumbar and lumbosacral intervertebral disc disorder: Secondary | ICD-10-CM

## 2014-02-15 LAB — C-REACTIVE PROTEIN: CRP: 0.5 mg/dL (ref <0.60)

## 2014-02-15 NOTE — Progress Notes (Signed)
Subjective:    Patient ID: Chloe Johnson, female    DOB: [birth year], 58 y.o.   MRN: [id number]  HPI 58yo F with L3-L4 culture negative discitis started on vancomycin and levofloxacin since July 2nd. She has finished 8 wks of IV antibiotics which she finished on 8/28 then transitioned oral doxycycline. She has not been able to take the medication twice a day since it causes her significant nausea despite taking 8mg  Q 8hrs.  She states that her back pain is improved but still overall weakness. She reports that she had a ground level fall last week. She attempted to to stand up on the toilet but unable to do so, and fell backwards. Had bruise on backside. Has not had further falls. No fever, or chills.  Current Outpatient Prescriptions on File Prior to Visit  Medication Sig Dispense Refill  . cyclobenzaprine (FLEXERIL) 10 MG tablet Take 1 tablet (10 mg total) by mouth 3 (three) times daily.  90 tablet  5  . doxycycline (VIBRA-TABS) 100 MG tablet Take 1 tablet (100 mg total) by mouth 2 (two) times daily.  60 tablet  1  . esomeprazole (NEXIUM) 40 MG capsule Take 40 mg by mouth daily as needed (for acid reflux).       Marland Kitchen EVENING PRIMROSE OIL PO Take 1,000 mg by mouth 2 (two) times daily.      Marland Kitchen FLUoxetine (PROZAC) 20 MG capsule Take 20 mg by mouth daily.       Marland Kitchen gabapentin (NEURONTIN) 600 MG tablet Take 1 tablet (600 mg total) by mouth 3 (three) times daily.  90 tablet  5  . lidocaine (LIDODERM) 5 % Place 1 patch onto the skin daily. Remove & Discard patch within 12 hours or as directed by MD  30 patch  5  . loratadine (CLARITIN) 10 MG tablet Take 10 mg by mouth daily as needed for allergies.      . naproxen (NAPROSYN) 500 MG tablet Take 1 tablet (500 mg total) by mouth 2 (two) times daily with a meal.  60 tablet  5  . Omega-3 Fatty Acids (FISH OIL) 1200 MG CAPS Take 1,200 mg by mouth 2 (two) times daily.      Marland Kitchen OVER THE COUNTER MEDICATION Take 1 tablet by mouth daily. "Estrovera"      . OVER THE COUNTER  MEDICATION Take 1-3 tablets by mouth daily as needed (for constipation). "Super Cleanse"      . OxyCODONE (OXYCONTIN) 15 mg T12A 12 hr tablet Take 1 tablet (15 mg total) by mouth every 8 (eight) hours.  90 tablet  0  . oxyCODONE-acetaminophen (PERCOCET) 5-325 MG per tablet 1 to 2 tablets every 6 hours as needed for pain.  60 tablet  0  . polyethylene glycol (MIRALAX / GLYCOLAX) packet Take 17 g by mouth daily as needed for mild constipation.  14 each  0  . senna-docusate (SENOKOT-S) 8.6-50 MG per tablet Take 2 tablets by mouth at bedtime.  60 tablet  5  . triamcinolone (NASACORT ALLERGY 24HR) 55 MCG/ACT AERO nasal inhaler Place 1 spray into both nostrils daily as needed (for allergies).       No current facility-administered medications on file prior to visit.   Active Ambulatory Problems    Diagnosis Date Noted  . Hypertension   . Asthma   . Anxiety   . Osteomyelitis of lumbar spine 11/16/2013  . Diarrhea 11/17/2013  . Discitis 11/24/2013  . Lumbar discitis 11/29/2013   Resolved Ambulatory Problems  Diagnosis Date Noted  . Possible Bilateral cellulitis of lower leg 11/17/2013  . Discitis of lumbar region 11/24/2013   Past Medical History  Diagnosis Date  . Neck pain   . Kidney stones   . Exertional asthma   . GERD (gastroesophageal reflux disease)   . Back pain   . Osteomyelitis 11/2013       Review of Systems     Objective:   Physical Exam BP 134/79  Pulse 97  Temp(Src) 98.3 F (36.8 C) (Oral)  Ht  (1.549 m)  Wt 163 lb 12 oz (74.277 kg)  BMI 30.96 kg/m2 Physical Exam  Constitutional:  oriented to person, place, and time. appears well-developed and well-nourished. No distress.  Neurological: alert and oriented to person, place, and time.  Skin: Skin is warm and dry. No rash noted. No erythema. Surgical incision well healed Psychiatric: a normal mood and affect.  behavior is normal.    Labs: Lab Results  Component Value Date   CRP 0.5 02/15/2014   Lab  Results  Component Value Date   ESRSEDRATE 10 01/18/2014        Assessment & Plan:  Will check sed rate and crp, if normalzize. We will d/c doxycycline  Will refer to nsgy for further evaluation   Send notes to [place]

## 2014-02-16 LAB — SEDIMENTATION RATE: Sed Rate: 8 mm/hr (ref 0–22)

## 2014-06-26 ENCOUNTER — Other Ambulatory Visit: Payer: Self-pay | Admitting: Obstetrics & Gynecology

## 2014-06-27 LAB — CYTOLOGY - PAP

## 2014-06-28 ENCOUNTER — Other Ambulatory Visit: Payer: Self-pay | Admitting: Neurological Surgery

## 2014-06-28 DIAGNOSIS — M4646 Discitis, unspecified, lumbar region: Secondary | ICD-10-CM

## 2014-06-30 ENCOUNTER — Ambulatory Visit
Admission: RE | Admit: 2014-06-30 | Discharge: 2014-06-30 | Disposition: A | Discharge status: Home / Self Care | Payer: Managed Care, Other (non HMO) | Source: Ambulatory Visit | Attending: Neurological Surgery | Admitting: Neurological Surgery

## 2014-06-30 DIAGNOSIS — M4646 Discitis, unspecified, lumbar region: Secondary | ICD-10-CM

## 2014-10-18 ENCOUNTER — Other Ambulatory Visit: Payer: Self-pay | Admitting: Neurological Surgery

## 2014-11-02 NOTE — Pre-Procedure Instructions (Signed)
Chloe Johnson  11/02/2014      [facility] - [place], [place] - [address] [place] [address] Phone: [phone] Fax: [phone]    Your procedure is scheduled on Wed, June 22 @ 9:30 AM  Report to [facility] Admitting at 7:30 AM  Call this number if you have problems the morning of surgery:  [phone]   Remember:  Do not eat food or drink liquids after midnight.  Take these medicines the morning of surgery with A SIP OF WATER:Nexium(Esomeprazole),Prozac(Fluoxetine),Gabapentin(Neurontin),Claritin(Loratadine),Zofran(Ondansetron-if needed),and Pain Pill(if needed)             Stop taking your Fish Oil and Aleve along with any Vitamins or Herbal Medications. No Goody's,BC's,Aspirin,Ibuprofen.   Do not wear jewelry, make-up or nail polish.  Do not wear lotions, powders, or perfumes.  You may wear deodorant.  Do not shave 48 hours prior to surgery.    Do not bring valuables to the hospital.  [facility] is not responsible for any belongings or valuables.  Contacts, dentures or bridgework may not be worn into surgery.  Leave your suitcase in the car.  After surgery it may be brought to your room.  For patients admitted to the hospital, discharge time will be determined by your treatment team.  Patients discharged the day of surgery will not be allowed to drive home.    Special instructions:  [place] - Preparing for Surgery  Before surgery, you can play an important role.  Because skin is not sterile, your skin needs to be as free of germs as possible.  You can reduce the number of germs on you skin by washing with CHG (chlorahexidine gluconate) soap before surgery.  CHG is an antiseptic cleaner which kills germs and bonds with the skin to continue killing germs even after washing.  Please DO NOT use if you have an allergy to CHG or antibacterial soaps.  If your skin becomes reddened/irritated stop using the CHG  and inform your nurse when you arrive at Short Stay.  Do not shave (including legs and underarms) for at least 48 hours prior to the first CHG shower.  You may shave your face.  Please follow these instructions carefully:   1.  Shower with CHG Soap the night before surgery and the                                morning of Surgery.  2.  If you choose to wash your hair, wash your hair first as usual with your       normal shampoo.  3.  After you shampoo, rinse your hair and body thoroughly to remove the                      Shampoo.  4.  Use CHG as you would any other liquid soap.  You can apply chg directly       to the skin and wash gently with scrungie or a clean washcloth.  5.  Apply the CHG Soap to your body ONLY FROM THE NECK DOWN.        Do not use on open wounds or open sores.  Avoid contact with your eyes,       ears, mouth and genitals (private parts).  Wash genitals (private parts)       with your normal soap.  6.  Wash thoroughly, paying special attention to the area where your surgery        will be performed.  7.  Thoroughly rinse your body with warm water from the neck down.  8.  DO NOT shower/wash with your normal soap after using and rinsing off       the CHG Soap.  9.  Pat yourself dry with a clean towel.            10.  Wear clean pajamas.            11.  Place clean sheets on your bed the night of your first shower and do not        sleep with pets.  Day of Surgery  Do not apply any lotions/deoderants the morning of surgery.  Please wear clean clothes to the hospital/surgery center.    Please read over the following fact sheets that you were given. Pain Booklet, Coughing and Deep Breathing, Blood Transfusion Information, MRSA Information and Surgical Site Infection Prevention

## 2014-11-03 ENCOUNTER — Encounter (HOSPITAL_COMMUNITY): Payer: Self-pay

## 2014-11-03 ENCOUNTER — Encounter (HOSPITAL_COMMUNITY)
Admission: RE | Admit: 2014-11-03 | Discharge: 2014-11-03 | Disposition: A | Discharge status: Home / Self Care | Payer: Managed Care, Other (non HMO) | Source: Ambulatory Visit | Attending: Neurological Surgery | Admitting: Neurological Surgery

## 2014-11-03 DIAGNOSIS — M48061 Spinal stenosis, lumbar region without neurogenic claudication: Secondary | ICD-10-CM

## 2014-11-03 HISTORY — DX: Inflammatory liver disease, unspecified: K75.9

## 2014-11-03 HISTORY — DX: Persons encountering health services in other specified circumstances: Z76.89

## 2014-11-03 LAB — BASIC METABOLIC PANEL
ANION GAP: 11 (ref 5–15)
BUN: 7 mg/dL (ref 6–20)
CO2: 27 mmol/L (ref 22–32)
Calcium: 9.7 mg/dL (ref 8.9–10.3)
Chloride: 100 mmol/L — ABNORMAL LOW (ref 101–111)
Creatinine, Ser: 0.88 mg/dL (ref 0.44–1.00)
GFR calc Af Amer: >60 mL/min (ref >60)
Glucose, Bld: 107 mg/dL — ABNORMAL HIGH (ref 65–99)
POTASSIUM: 4 mmol/L (ref 3.5–5.1)
SODIUM: 138 mmol/L (ref 135–145)

## 2014-11-03 LAB — PROTIME-INR
INR: 1.03 (ref 0.00–1.49)
PROTHROMBIN TIME: 13.7 s (ref 11.6–15.2)

## 2014-11-03 LAB — SURGICAL PCR SCREEN
MRSA, PCR: NEGATIVE
Staphylococcus aureus: POSITIVE — AB

## 2014-11-03 LAB — CBC WITH DIFFERENTIAL/PLATELET
BASOS ABS: 0 10*3/uL (ref 0.0–0.1)
Basophils Relative: 1 % (ref 0–1)
EOS PCT: 3 % (ref 0–5)
Eosinophils Absolute: 0.2 10*3/uL (ref 0.0–0.7)
HCT: 40.8 % (ref 36.0–46.0)
HEMOGLOBIN: 13.1 g/dL (ref 12.0–15.0)
Lymphocytes Relative: 28 % (ref 12–46)
Lymphs Abs: 1.5 10*3/uL (ref 0.7–4.0)
MCH: 26.6 pg (ref 26.0–34.0)
MCHC: 32.1 g/dL (ref 30.0–36.0)
MCV: 82.9 fL (ref 78.0–100.0)
MONO ABS: 0.2 10*3/uL (ref 0.1–1.0)
MONOS PCT: 5 % (ref 3–12)
NEUTROS PCT: 63 % (ref 43–77)
Neutro Abs: 3.4 10*3/uL (ref 1.7–7.7)
Platelets: 193 10*3/uL (ref 150–400)
RBC: 4.92 MIL/uL (ref 3.87–5.11)
RDW: 12.8 % (ref 11.5–15.5)
WBC: 5.3 10*3/uL (ref 4.0–10.5)

## 2014-11-03 LAB — TYPE AND SCREEN
ABO/RH(D): O POS
Antibody Screen: NEGATIVE

## 2014-11-03 LAB — ABO/RH: ABO/RH(D): O POS

## 2014-11-03 NOTE — Progress Notes (Signed)
Pt. Never referred to cardiology for any problems, denies stress test or echo in her past.

## 2014-11-14 ENCOUNTER — Encounter (HOSPITAL_COMMUNITY): Payer: Self-pay | Admitting: Anesthesiology

## 2014-11-14 MED ORDER — DEXAMETHASONE SODIUM PHOSPHATE 10 MG/ML IJ SOLN
10.0000 mg | INTRAMUSCULAR | Status: AC
  Administered 2014-11-15: 10 mg via INTRAVENOUS
  Filled 2014-11-14: qty 1

## 2014-11-14 MED ORDER — VANCOMYCIN HCL IN DEXTROSE 1-5 GM/200ML-% IV SOLN
1000.0000 mg | INTRAVENOUS | Status: AC
  Administered 2014-11-15: 1000 mg via INTRAVENOUS
  Filled 2014-11-14: qty 200

## 2014-11-15 ENCOUNTER — Inpatient Hospital Stay (HOSPITAL_COMMUNITY)
Admission: RE | Admit: 2014-11-15 | Discharge: 2014-11-17 | DRG: 460 | Disposition: A | Discharge status: Home Health | Payer: Managed Care, Other (non HMO) | Source: Ambulatory Visit | Attending: Neurological Surgery | Admitting: Neurological Surgery

## 2014-11-15 ENCOUNTER — Inpatient Hospital Stay (HOSPITAL_COMMUNITY): Payer: Managed Care, Other (non HMO)

## 2014-11-15 ENCOUNTER — Encounter (HOSPITAL_COMMUNITY): Payer: Self-pay | Admitting: Anesthesiology

## 2014-11-15 ENCOUNTER — Inpatient Hospital Stay (HOSPITAL_COMMUNITY): Payer: Managed Care, Other (non HMO) | Admitting: Anesthesiology

## 2014-11-15 ENCOUNTER — Encounter (HOSPITAL_COMMUNITY)
Admission: RE | Disposition: A | Discharge status: Home Health | Payer: Managed Care, Other (non HMO) | Source: Ambulatory Visit | Attending: Neurological Surgery

## 2014-11-15 DIAGNOSIS — I10 Essential (primary) hypertension: Secondary | ICD-10-CM | POA: Diagnosis present

## 2014-11-15 DIAGNOSIS — Z79891 Long term (current) use of opiate analgesic: Secondary | ICD-10-CM | POA: Diagnosis not present

## 2014-11-15 DIAGNOSIS — J45909 Unspecified asthma, uncomplicated: Secondary | ICD-10-CM | POA: Diagnosis present

## 2014-11-15 DIAGNOSIS — Z791 Long term (current) use of non-steroidal anti-inflammatories (NSAID): Secondary | ICD-10-CM | POA: Diagnosis not present

## 2014-11-15 DIAGNOSIS — Z419 Encounter for procedure for purposes other than remedying health state, unspecified: Secondary | ICD-10-CM

## 2014-11-15 DIAGNOSIS — M4806 Spinal stenosis, lumbar region: Secondary | ICD-10-CM | POA: Diagnosis present

## 2014-11-15 DIAGNOSIS — Z981 Arthrodesis status: Secondary | ICD-10-CM

## 2014-11-15 DIAGNOSIS — K219 Gastro-esophageal reflux disease without esophagitis: Secondary | ICD-10-CM | POA: Diagnosis present

## 2014-11-15 DIAGNOSIS — Z79899 Other long term (current) drug therapy: Secondary | ICD-10-CM

## 2014-11-15 LAB — BASIC METABOLIC PANEL
Anion gap: 12 (ref 5–15)
BUN: 10 mg/dL (ref 6–20)
CHLORIDE: 100 mmol/L — AB (ref 101–111)
CO2: 24 mmol/L (ref 22–32)
Calcium: 8.8 mg/dL — ABNORMAL LOW (ref 8.9–10.3)
Creatinine, Ser: 1.1 mg/dL — ABNORMAL HIGH (ref 0.44–1.00)
GFR calc Af Amer: >60 mL/min (ref >60)
GFR calc non Af Amer: 55 mL/min — ABNORMAL LOW (ref >60)
GLUCOSE: 171 mg/dL — AB (ref 65–99)
POTASSIUM: 3.8 mmol/L (ref 3.5–5.1)
Sodium: 136 mmol/L (ref 135–145)

## 2014-11-15 SURGERY — POSTERIOR LUMBAR FUSION 3 LEVEL
Anesthesia: General | Site: Back

## 2014-11-15 MED ORDER — ACETAMINOPHEN 650 MG RE SUPP: 650.0000 mg | RECTAL | Status: DC | PRN

## 2014-11-15 MED ORDER — OXYCODONE-ACETAMINOPHEN 5-325 MG PO TABS
1.0000 | ORAL_TABLET | ORAL | Status: DC | PRN
  Administered 2014-11-15 – 2014-11-17 (×6): 2 via ORAL
  Administered 2014-11-17: 1 via ORAL
  Filled 2014-11-15 (×7): qty 2

## 2014-11-15 MED ORDER — FENTANYL CITRATE (PF) 250 MCG/5ML IJ SOLN
INTRAMUSCULAR | Status: AC
  Filled 2014-11-15: qty 5

## 2014-11-15 MED ORDER — VANCOMYCIN HCL IN DEXTROSE 1-5 GM/200ML-% IV SOLN
1000.0000 mg | Freq: Once | INTRAVENOUS | Status: AC
  Administered 2014-11-15: 1000 mg via INTRAVENOUS
  Filled 2014-11-15: qty 200

## 2014-11-15 MED ORDER — SENNA 8.6 MG PO TABS
1.0000 | ORAL_TABLET | Freq: Two times a day (BID) | ORAL | Status: DC
  Administered 2014-11-15 – 2014-11-16 (×2): 8.6 mg via ORAL
  Filled 2014-11-15 (×4): qty 1

## 2014-11-15 MED ORDER — GABAPENTIN 600 MG PO TABS
600.0000 mg | ORAL_TABLET | Freq: Two times a day (BID) | ORAL | Status: DC
  Administered 2014-11-16 – 2014-11-17 (×3): 600 mg via ORAL
  Filled 2014-11-15 (×3): qty 1

## 2014-11-15 MED ORDER — FENTANYL CITRATE (PF) 100 MCG/2ML IJ SOLN
INTRAMUSCULAR | Status: AC
  Filled 2014-11-15: qty 2

## 2014-11-15 MED ORDER — HYDROMORPHONE HCL 1 MG/ML IJ SOLN
0.5000 mg | INTRAMUSCULAR | Status: AC | PRN
  Administered 2014-11-15 (×4): 0.5 mg via INTRAVENOUS

## 2014-11-15 MED ORDER — THROMBIN 20000 UNITS EX SOLR
CUTANEOUS | Status: DC | PRN
  Administered 2014-11-15: 09:00:00 via TOPICAL

## 2014-11-15 MED ORDER — CYCLOBENZAPRINE HCL 10 MG PO TABS
10.0000 mg | ORAL_TABLET | Freq: Three times a day (TID) | ORAL | Status: DC | PRN
  Administered 2014-11-15 – 2014-11-17 (×5): 10 mg via ORAL
  Filled 2014-11-15 (×4): qty 1

## 2014-11-15 MED ORDER — CYCLOBENZAPRINE HCL 10 MG PO TABS
ORAL_TABLET | ORAL | Status: AC
Start: 2014-11-15 — End: 2014-11-16
  Filled 2014-11-15: qty 1

## 2014-11-15 MED ORDER — GABAPENTIN 600 MG PO TABS
1200.0000 mg | ORAL_TABLET | Freq: Every day | ORAL | Status: DC
  Administered 2014-11-15 – 2014-11-16 (×2): 1200 mg via ORAL
  Filled 2014-11-15 (×2): qty 2

## 2014-11-15 MED ORDER — HYDROMORPHONE HCL 1 MG/ML IJ SOLN
INTRAMUSCULAR | Status: AC
  Filled 2014-11-15: qty 1

## 2014-11-15 MED ORDER — BUPIVACAINE HCL (PF) 0.25 % IJ SOLN
INTRAMUSCULAR | Status: DC | PRN
  Administered 2014-11-15: 5 mL

## 2014-11-15 MED ORDER — GABAPENTIN 600 MG PO TABS
600.0000 mg | ORAL_TABLET | Freq: Three times a day (TID) | ORAL | Status: DC
  Filled 2014-11-15: qty 1

## 2014-11-15 MED ORDER — VANCOMYCIN HCL IN DEXTROSE 750-5 MG/150ML-% IV SOLN
750.0000 mg | Freq: Two times a day (BID) | INTRAVENOUS | Status: DC
  Administered 2014-11-16 – 2014-11-17 (×3): 750 mg via INTRAVENOUS
  Filled 2014-11-15 (×4): qty 150

## 2014-11-15 MED ORDER — 0.9 % SODIUM CHLORIDE (POUR BTL) OPTIME
TOPICAL | Status: DC | PRN
  Administered 2014-11-15: 1000 mL

## 2014-11-15 MED ORDER — THROMBIN 5000 UNITS EX SOLR
CUTANEOUS | Status: DC | PRN
  Administered 2014-11-15: 09:00:00 via TOPICAL

## 2014-11-15 MED ORDER — SODIUM CHLORIDE 0.9 % IV SOLN: 250.0000 mL | INTRAVENOUS | Status: DC

## 2014-11-15 MED ORDER — LISINOPRIL-HYDROCHLOROTHIAZIDE 10-12.5 MG PO TABS: 1.0000 | ORAL_TABLET | Freq: Every day | ORAL | Status: DC

## 2014-11-15 MED ORDER — POTASSIUM CHLORIDE IN NACL 20-0.9 MEQ/L-% IV SOLN
INTRAVENOUS | Status: DC
  Administered 2014-11-15: 75 mL/h via INTRAVENOUS
  Administered 2014-11-16: 1000 mL via INTRAVENOUS
  Filled 2014-11-15 (×5): qty 1000

## 2014-11-15 MED ORDER — HYDROCHLOROTHIAZIDE 12.5 MG PO CAPS
12.5000 mg | ORAL_CAPSULE | Freq: Every day | ORAL | Status: DC
  Administered 2014-11-16: 12.5 mg via ORAL
  Filled 2014-11-15 (×2): qty 1

## 2014-11-15 MED ORDER — MENTHOL 3 MG MT LOZG: 1.0000 | LOZENGE | OROMUCOSAL | Status: DC | PRN

## 2014-11-15 MED ORDER — ACETAMINOPHEN 325 MG PO TABS: 650.0000 mg | ORAL_TABLET | ORAL | Status: DC | PRN

## 2014-11-15 MED ORDER — HYDROMORPHONE HCL 1 MG/ML IJ SOLN
0.5000 mg | Freq: Once | INTRAMUSCULAR | Status: AC
  Administered 2014-11-15: 0.5 mg via INTRAVENOUS

## 2014-11-15 MED ORDER — LACTATED RINGERS IV SOLN
INTRAVENOUS | Status: DC | PRN
  Administered 2014-11-15 (×4): via INTRAVENOUS

## 2014-11-15 MED ORDER — PHENYLEPHRINE HCL 10 MG/ML IJ SOLN
10.0000 mg | INTRAVENOUS | Status: DC | PRN
  Administered 2014-11-15 (×2): 25 ug/min via INTRAVENOUS

## 2014-11-15 MED ORDER — MIDAZOLAM HCL 2 MG/2ML IJ SOLN
INTRAMUSCULAR | Status: AC
  Filled 2014-11-15: qty 2

## 2014-11-15 MED ORDER — OXYCODONE-ACETAMINOPHEN 5-325 MG PO TABS
ORAL_TABLET | ORAL | Status: AC
  Filled 2014-11-15: qty 2

## 2014-11-15 MED ORDER — SODIUM CHLORIDE 0.9 % IJ SOLN
3.0000 mL | Freq: Two times a day (BID) | INTRAMUSCULAR | Status: DC
  Administered 2014-11-16: 3 mL via INTRAVENOUS

## 2014-11-15 MED ORDER — ONDANSETRON HCL 4 MG/2ML IJ SOLN: 4.0000 mg | Freq: Once | INTRAMUSCULAR | Status: DC | PRN

## 2014-11-15 MED ORDER — DIPHENHYDRAMINE HCL 50 MG/ML IJ SOLN
INTRAMUSCULAR | Status: AC
  Filled 2014-11-15: qty 1

## 2014-11-15 MED ORDER — MORPHINE SULFATE 2 MG/ML IJ SOLN
1.0000 mg | INTRAMUSCULAR | Status: DC | PRN
  Administered 2014-11-15 – 2014-11-16 (×6): 2 mg via INTRAVENOUS
  Filled 2014-11-15 (×6): qty 1

## 2014-11-15 MED ORDER — PHENOL 1.4 % MT LIQD: 1.0000 | OROMUCOSAL | Status: DC | PRN

## 2014-11-15 MED ORDER — ONDANSETRON HCL 4 MG/2ML IJ SOLN: 4.0000 mg | INTRAMUSCULAR | Status: DC | PRN

## 2014-11-15 MED ORDER — PHENYLEPHRINE HCL 10 MG/ML IJ SOLN
INTRAMUSCULAR | Status: DC | PRN
  Administered 2014-11-15 (×2): 80 ug via INTRAVENOUS

## 2014-11-15 MED ORDER — FLUOXETINE HCL 20 MG PO CAPS
20.0000 mg | ORAL_CAPSULE | Freq: Every day | ORAL | Status: DC
  Administered 2014-11-16 – 2014-11-17 (×2): 20 mg via ORAL
  Filled 2014-11-15 (×2): qty 1

## 2014-11-15 MED ORDER — LIDOCAINE HCL (CARDIAC) 20 MG/ML IV SOLN
INTRAVENOUS | Status: DC | PRN
  Administered 2014-11-15: 50 mg via INTRAVENOUS

## 2014-11-15 MED ORDER — PANTOPRAZOLE SODIUM 40 MG PO TBEC
40.0000 mg | DELAYED_RELEASE_TABLET | Freq: Every day | ORAL | Status: DC
  Administered 2014-11-16 – 2014-11-17 (×2): 40 mg via ORAL
  Filled 2014-11-15 (×2): qty 1

## 2014-11-15 MED ORDER — SODIUM CHLORIDE 0.9 % IJ SOLN: 3.0000 mL | INTRAMUSCULAR | Status: DC | PRN

## 2014-11-15 MED ORDER — MIDAZOLAM HCL 2 MG/2ML IJ SOLN
1.0000 mg | Freq: Once | INTRAMUSCULAR | Status: AC
Start: 2014-11-15 — End: 2014-11-15
  Administered 2014-11-15: 1 mg via INTRAVENOUS

## 2014-11-15 MED ORDER — DIPHENHYDRAMINE HCL 50 MG/ML IJ SOLN
12.5000 mg | Freq: Once | INTRAMUSCULAR | Status: AC
  Administered 2014-11-15: 12.5 mg via INTRAVENOUS

## 2014-11-15 MED ORDER — LISINOPRIL 10 MG PO TABS
10.0000 mg | ORAL_TABLET | Freq: Every day | ORAL | Status: DC
  Administered 2014-11-16: 10 mg via ORAL
  Filled 2014-11-15 (×2): qty 1

## 2014-11-15 MED ORDER — PROPOFOL 10 MG/ML IV BOLUS
INTRAVENOUS | Status: DC | PRN
  Administered 2014-11-15: 200 mg via INTRAVENOUS

## 2014-11-15 MED ORDER — FENTANYL CITRATE (PF) 100 MCG/2ML IJ SOLN
INTRAMUSCULAR | Status: DC | PRN
  Administered 2014-11-15: 100 ug via INTRAVENOUS
  Administered 2014-11-15: 150 ug via INTRAVENOUS

## 2014-11-15 MED ORDER — SODIUM CHLORIDE 0.9 % IR SOLN
Status: DC | PRN
  Administered 2014-11-15: 09:00:00

## 2014-11-15 MED ORDER — ZOLPIDEM TARTRATE 5 MG PO TABS: 5.0000 mg | ORAL_TABLET | Freq: Every evening | ORAL | Status: DC | PRN

## 2014-11-15 MED ORDER — EPHEDRINE SULFATE 50 MG/ML IJ SOLN
INTRAMUSCULAR | Status: DC | PRN
  Administered 2014-11-15 (×2): 10 mg via INTRAVENOUS

## 2014-11-15 MED ORDER — FENTANYL CITRATE (PF) 100 MCG/2ML IJ SOLN
25.0000 ug | INTRAMUSCULAR | Status: DC | PRN
  Administered 2014-11-15: 25 ug via INTRAVENOUS
  Administered 2014-11-15: 50 ug via INTRAVENOUS
  Administered 2014-11-15 (×3): 25 ug via INTRAVENOUS

## 2014-11-15 MED ORDER — LACTATED RINGERS IV SOLN
INTRAVENOUS | Status: DC
  Administered 2014-11-15: 09:00:00 via INTRAVENOUS

## 2014-11-15 MED ORDER — ALBUMIN HUMAN 5 % IV SOLN
INTRAVENOUS | Status: DC | PRN
  Administered 2014-11-15: 10:00:00 via INTRAVENOUS

## 2014-11-15 MED ORDER — SUCCINYLCHOLINE CHLORIDE 20 MG/ML IJ SOLN
INTRAMUSCULAR | Status: DC | PRN
  Administered 2014-11-15: 100 mg via INTRAVENOUS

## 2014-11-15 MED ORDER — PROPOFOL 10 MG/ML IV BOLUS
INTRAVENOUS | Status: AC
  Filled 2014-11-15: qty 20

## 2014-11-15 SURGICAL SUPPLY — 75 items
APL SKNCLS STERI-STRIP NONHPOA (GAUZE/BANDAGES/DRESSINGS) ×1
BAG DECANTER FOR FLEXI CONT (MISCELLANEOUS) ×2 IMPLANT
BENZOIN TINCTURE PRP APPL 2/3 (GAUZE/BANDAGES/DRESSINGS) ×2 IMPLANT
BIT DRILL PLIF MAS 5.0MM DISP (DRILL) IMPLANT
BLADE CLIPPER SURG (BLADE) IMPLANT
BONE CANC CHIPS 40CC CAN1/2 (Bone Implant) ×2 IMPLANT
BUR MATCHSTICK NEURO 3.0 LAGG (BURR) ×2 IMPLANT
CANISTER SUCT 3000ML PPV (MISCELLANEOUS) ×2 IMPLANT
CHIPS CANC BONE 40CC CAN1/2 (Bone Implant) ×1 IMPLANT
CLIP NEUROVISION LG (CLIP) ×1 IMPLANT
CONT SPEC 4OZ CLIKSEAL STRL BL (MISCELLANEOUS) ×4 IMPLANT
COVER BACK TABLE 60X90IN (DRAPES) ×2 IMPLANT
DRAPE C-ARM 42X72 X-RAY (DRAPES) ×3 IMPLANT
DRAPE C-ARMOR (DRAPES) ×1 IMPLANT
DRAPE LAPAROTOMY 100X72X124 (DRAPES) ×2 IMPLANT
DRAPE POUCH INSTRU U-SHP 10X18 (DRAPES) ×2 IMPLANT
DRAPE SURG 17X23 STRL (DRAPES) ×2 IMPLANT
DRILL PLIF MAS 5.0MM DISP (DRILL) ×2
DRSG OPSITE 4X5.5 SM (GAUZE/BANDAGES/DRESSINGS) ×4 IMPLANT
DRSG OPSITE POSTOP 4X6 (GAUZE/BANDAGES/DRESSINGS) ×1 IMPLANT
DRSG TELFA 3X8 NADH (GAUZE/BANDAGES/DRESSINGS) IMPLANT
DURAPREP 26ML APPLICATOR (WOUND CARE) ×2 IMPLANT
ELECT BLADE 4.0 EZ CLEAN MEGAD (MISCELLANEOUS) ×2
ELECT REM PT RETURN 9FT ADLT (ELECTROSURGICAL) ×2
ELECTRODE BLDE 4.0 EZ CLN MEGD (MISCELLANEOUS) ×1 IMPLANT
ELECTRODE REM PT RTRN 9FT ADLT (ELECTROSURGICAL) ×1 IMPLANT
EVACUATOR 1/8 PVC DRAIN (DRAIN) ×2 IMPLANT
GAUZE SPONGE 4X4 16PLY XRAY LF (GAUZE/BANDAGES/DRESSINGS) IMPLANT
GLOVE BIOGEL PI IND STRL 7.5 (GLOVE) IMPLANT
GLOVE BIOGEL PI INDICATOR 7.5 (GLOVE) ×1
GLOVE INDICATOR 7.5 STRL GRN (GLOVE) ×1 IMPLANT
GLOVE SURG SS PI 7.0 STRL IVOR (GLOVE) ×2 IMPLANT
GLOVE SURG SS PI 7.5 STRL IVOR (GLOVE) ×3 IMPLANT
GLOVE SURG SS PI 8.0 STRL IVOR (GLOVE) ×6 IMPLANT
GLOVE SURG SS PI 8.5 STRL IVOR (GLOVE) ×1
GLOVE SURG SS PI 8.5 STRL STRW (GLOVE) IMPLANT
GOWN STRL REUS W/ TWL LRG LVL3 (GOWN DISPOSABLE) IMPLANT
GOWN STRL REUS W/ TWL XL LVL3 (GOWN DISPOSABLE) ×2 IMPLANT
GOWN STRL REUS W/TWL 2XL LVL3 (GOWN DISPOSABLE) IMPLANT
GOWN STRL REUS W/TWL LRG LVL3 (GOWN DISPOSABLE)
GOWN STRL REUS W/TWL XL LVL3 (GOWN DISPOSABLE) ×8
HEMOSTAT POWDER KIT SURGIFOAM (HEMOSTASIS) ×1 IMPLANT
KIT BASIN OR (CUSTOM PROCEDURE TRAY) ×2 IMPLANT
KIT NDL NVM5 EMG ELECT (KITS) IMPLANT
KIT NEEDLE NVM5 EMG ELECT (KITS) ×1 IMPLANT
KIT NEEDLE NVM5 EMG ELECTRODE (KITS) ×1
KIT ROOM TURNOVER OR (KITS) ×2 IMPLANT
MILL MEDIUM DISP (BLADE) ×1 IMPLANT
NDL HYPO 25X1 1.5 SAFETY (NEEDLE) ×1 IMPLANT
NEEDLE HYPO 25X1 1.5 SAFETY (NEEDLE) ×2 IMPLANT
NS IRRIG 1000ML POUR BTL (IV SOLUTION) ×2 IMPLANT
PACK LAMINECTOMY NEURO (CUSTOM PROCEDURE TRAY) ×2 IMPLANT
PAD ARMBOARD 7.5X6 YLW CONV (MISCELLANEOUS) ×6 IMPLANT
ROD 100MM (Rod) ×4 IMPLANT
ROD SPNL 100XPREBNT NS MAS (Rod) ×2 IMPLANT
SCREW LOCK (Screw) ×16 IMPLANT
SCREW LOCK FXNS SPNE MAS PL (Screw) IMPLANT
SCREW SHANK 4.5X35 (Screw) ×2 IMPLANT
SCREW SHANK 5.0X30MM (Screw) ×2 IMPLANT
SCREW SHANK 5.0X35 (Screw) ×4 IMPLANT
SCREW TULIP 5.5 (Screw) ×8 IMPLANT
SPONGE LAP 4X18 X RAY DECT (DISPOSABLE) IMPLANT
SPONGE SURGIFOAM ABS GEL 100 (HEMOSTASIS) ×2 IMPLANT
STRIP CLOSURE SKIN 1/2X4 (GAUZE/BANDAGES/DRESSINGS) ×3 IMPLANT
SUT VIC AB 0 CT1 18XCR BRD8 (SUTURE) ×2 IMPLANT
SUT VIC AB 0 CT1 8-18 (SUTURE) ×4
SUT VIC AB 2-0 CP2 18 (SUTURE) ×3 IMPLANT
SUT VIC AB 3-0 SH 8-18 (SUTURE) ×4 IMPLANT
SYR 20ML ECCENTRIC (SYRINGE) ×2 IMPLANT
TOWEL OR 17X24 6PK STRL BLUE (TOWEL DISPOSABLE) ×2 IMPLANT
TOWEL OR 17X26 10 PK STRL BLUE (TOWEL DISPOSABLE) ×2 IMPLANT
TRAP SPECIMEN MUCOUS 40CC (MISCELLANEOUS) ×1 IMPLANT
TRAY FOLEY BAG SILVER LF 14FR (CATHETERS) ×1 IMPLANT
TRAY FOLEY W/METER SILVER 14FR (SET/KITS/TRAYS/PACK) ×1 IMPLANT
WATER STERILE IRR 1000ML POUR (IV SOLUTION) ×2 IMPLANT

## 2014-11-15 NOTE — H&P (Signed)
Subjective: Patient is a 59 y.o. female admitted for lumbar spinal stenosis and previous discitis. Onset of symptoms was a few years ago, gradually worsening since that time.  The pain is rated severe, and is located at the across the lower back and radiates to her legs. 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 The pain is described as aching and occurs all day. The symptoms have been progressive. Symptoms are exacerbated by exercise. MRI or CT showed destruction of the L3-4 disc space with severe stenosis at L3-4 and mild stenosis at L2-3 and moderate stenosis at L4-5.   Past Medical History  Diagnosis Date  . Hypertension   . Neck pain   . Anxiety   . Kidney stones     "have always passed them"  . Exertional asthma   . GERD (gastroesophageal reflux disease)   . Back pain   . Osteomyelitis 11/2013    osteomyelitis, discitis   . Sleep concern     uses Prozac for sleep   . Discitis of lumbar region 11/16/2013    L3-4/notes 11/24/2013, arms, neck  . Hepatitis     Past Surgical History  Procedure Laterality Date  . Tonsillectomy and adenoidectomy  1975  . Tubal ligation  1999  . Breast cyst excision Left 2010    "polypectomy"  . Picc line place peripheral ([facility] hx) Right     for use of Levaquin & Vancomycin, of note: she reports that she had a "flulike feeling"     Prior to Admission medications   Medication Sig Start Date End Date Taking? Authorizing Provider  cyclobenzaprine (FLEXERIL) 10 MG tablet Take 1 tablet (10 mg total) by mouth 3 (three) times daily. 12/02/13  Yes Kirby Funk, MD  esomeprazole (NEXIUM) 40 MG capsule Take 40 mg by mouth every morning.    Yes Historical Provider, MD  EVENING PRIMROSE OIL PO Take 1,300 mg by mouth 2 (two) times daily.   Yes Historical Provider, MD  FLUoxetine (PROZAC) 20 MG capsule Take 20 mg by mouth daily before breakfast.  10/08/13  Yes  Historical Provider, MD  gabapentin (NEURONTIN) 600 MG tablet Take 1 tablet (600 mg total) by mouth 3 (three) times daily. Patient taking differently: Take 600-1,200 mg by mouth 3 (three) times daily. 600 mg in the morning, 600 mg in the afternoon, and 1200 mg at bedtime 12/02/13  Yes Kirby Funk, MD  guaiFENesin (MUCINEX) 600 MG 12 hr tablet Take 600 mg by mouth as needed for cough or to loosen phlegm.   Yes Historical Provider, MD  lisinopril-hydrochlorothiazide (PRINZIDE,ZESTORETIC) 10-12.5 MG per tablet Take 1 tablet by mouth daily.   Yes Historical Provider, MD  loratadine (CLARITIN) 10 MG tablet Take 10 mg by mouth every morning.    Yes Historical Provider, MD  naproxen (NAPROSYN) 500 MG tablet Take 1 tablet (500 mg total) by mouth 2 (two) times daily with a meal. 12/02/13  Yes Kirby Funk, MD  Omega-3 Fatty Acids (FISH OIL PO) Take 1,500 mg by mouth 2 (two) times daily.   Yes Historical Provider, MD  ondansetron (ZOFRAN) 8 MG tablet Take 8 mg by mouth every 8 (eight) hours as needed for nausea or vomiting.   Yes Historical Provider, MD  OVER THE COUNTER MEDICATION Take 1-3 tablets by mouth daily as needed (for constipation). "Super Cleanse"   Yes Historical Provider, MD  oxyCODONE-acetaminophen (PERCOCET) 5-325 MG per  tablet 1 to 2 tablets every 6 hours as needed for pain. 11/28/13  Yes Kirby Funk, MD  oxymorphone (OPANA ER) 15 MG 12 hr tablet Take 15 mg by mouth every 12 (twelve) hours.   Yes Historical Provider, MD  potassium chloride SA (K-DUR,KLOR-CON) 20 MEQ tablet Take 20 mEq by mouth daily.   Yes Historical Provider, MD  Probiotic CAPS Take 1 capsule by mouth 2 (two) times daily.   Yes Historical Provider, MD  Rhubarb (ESTROVERA PO) Take 1 tablet by mouth daily.   Yes Historical Provider, MD  zolpidem (AMBIEN) 10 MG tablet Take 10 mg by mouth at bedtime as needed for sleep.   Yes Historical Provider, MD   Allergies  Allergen Reactions  . Doxycycline Nausea Only    malaise   .  Erythromycin Nausea And Vomiting  . Latex Other (See Comments)    Irritation of the skin  . Shellfish Allergy Other (See Comments)    GI- Facial  Acne  . Strawberry Other (See Comments)    Mouth ulcers   . Vicodin [Hydrocodone-Acetaminophen] Other (See Comments)    Restless, tolerate percocet  . Dilaudid [Hydromorphone Hcl] Rash    *Pt states she can take if given benadryl prior*  . Keflex [Cephalexin] Rash  . Penicillins Rash    Hives  . Sulfa Antibiotics Rash    Hives    History  Substance Use Topics  . Smoking status: Never Smoker   . Smokeless tobacco: Never Used  . Alcohol Use: 3.6 oz/week    3 Glasses of wine, 3 Shots of liquor per week    Family History  Problem Relation Age of Onset  . Other Father   . Hypertension Father      Review of Systems  Positive ROS: neg  All other systems have been reviewed and were otherwise negative with the exception of those mentioned in the HPI and as above.  Objective: Vital signs in last 24 hours: Temp:  [97.9 F (36.6 C)] 97.9 F (36.6 C) (06/22 0812) Pulse Rate:  [76] 76 (06/22 0812) Resp:  [20] 20 (06/22 0812) BP: (111)/(76) 111/76 mmHg (06/22 0812) SpO2:  [97 %] 97 % (06/22 0812) Weight:  [171 lb 12 oz (77.905 kg)] 171 lb 12 oz (77.905 kg) (06/22 1219)  General Appearance: Alert, cooperative, no distress, appears stated age Head: Normocephalic, without obvious abnormality, atraumatic Eyes: PERRL, conjunctiva/corneas clear, EOM's intact    Neck: Supple, symmetrical, trachea midline Back: Symmetric, no curvature, ROM normal, no CVA tenderness Lungs:  respirations unlabored Heart: Regular rate and rhythm Abdomen: Soft, non-tender Extremities: Extremities normal, atraumatic, no cyanosis or edema Pulses: 2+ and symmetric all extremities Skin: Skin color, texture, turgor normal, no rashes or lesions  NEUROLOGIC:   Mental status: Alert and oriented x4,  no aphasia, good attention span, fund of knowledge, and  memory Motor Exam - grossly normal Sensory Exam - grossly normal Reflexes: 1+ Coordination - grossly normal Gait - grossly normal Balance - grossly normal Cranial Nerves: I: smell Not tested  II: visual acuity  OS: nl    OD: nl  II: visual fields Full to confrontation  II: pupils Equal, round, reactive to light  III,VII: ptosis None  III,IV,VI: extraocular muscles  Full ROM  V: mastication Normal  V: facial light touch sensation  Normal  V,VII: corneal reflex  Present  VII: facial muscle function - upper  Normal  VII: facial muscle function - lower Normal  VIII: hearing Not tested  IX: soft  palate elevation  Normal  IX,X: gag reflex Present  XI: trapezius strength  5/5  XI: sternocleidomastoid strength 5/5  XI: neck flexion strength  5/5  XII: tongue strength  Normal    Data Review Lab Results  Component Value Date   WBC 5.3 11/03/2014   HGB 13.1 11/03/2014   HCT 40.8 11/03/2014   MCV 82.9 11/03/2014   PLT 193 11/03/2014   Lab Results  Component Value Date   NA 138 11/03/2014   K 4.0 11/03/2014   CL 100* 11/03/2014   CO2 27 11/03/2014   BUN 7 11/03/2014   CREATININE 0.88 11/03/2014   GLUCOSE 107* 11/03/2014   Lab Results  Component Value Date   INR 1.03 11/03/2014    Assessment/Plan: Patient admitted for decompression and instrumented fusion L2-L5. Patient has failed a reasonable attempt at conservative therapy.  I explained the condition and procedure to the patient and answered any questions.  Patient wishes to proceed with procedure as planned. Understands risks/ benefits and typical outcomes of procedure.   Bird Tailor S 11/15/2014 9:14 AM

## 2014-11-15 NOTE — Evaluation (Addendum)
Occupational Therapy Evaluation Patient Details Name: Chloe Johnson MRN: [id number] DOB: Apr 06, 1956 Today's Date: 11/15/2014    History of Present Illness 59 y.o. s/p Laminectomy and Foraminotomy - Lumbar three-lumbar four, lumbar four-five Posterior Lateral Fusion Lumbar two to lumbar five, segmental instrumentation Lumbar two to Lumbar five.   Clinical Impression   Pt s/p above. Pt independent with ADLs, PTA. Feel pt will benefit from acute OT to increase independence prior to d/c.    Follow Up Recommendations  No OT follow up;Supervision - Intermittent    Equipment Recommendations  None recommended by OT    Recommendations for Other Services       Precautions / Restrictions Precautions Precautions: Back;Fall Precaution Booklet Issued: No Precaution Comments: educated on back precautions Required Braces or Orthoses: Spinal Brace Spinal Brace: Lumbar corset;Applied in sitting position Restrictions Weight Bearing Restrictions: No      Mobility Bed Mobility Overal bed mobility: Needs Assistance Bed Mobility: Rolling;Sidelying to Sit;Sit to Sidelying Rolling: Supervision Sidelying to sit: Min assist     Sit to sidelying: Supervision General bed mobility comments: verbal and tactile cues given.  Transfers Overall transfer level: Needs assistance   Transfers: Sit to/from Stand Sit to Stand: Min guard         General transfer comment: explained technique.    Balance    Min guard for ambulation. Balance not formally assessed.                                        ADL Overall ADL's : Needs assistance/impaired     Grooming: Set up;Supervision/safety;Sitting           Upper Body Dressing : Sitting;Minimal assistance   Lower Body Dressing: Min guard;Sit to/from stand   Toilet Transfer: Min guard;Ambulation (bed)           Functional mobility during ADLs: Min guard;Rolling walker General ADL Comments: Educated on back brace.  Discussed incorporating precautions into functional activities such as use of cup for oral care and placement of grooming items, and not reaching above shoulder height. Educated on safety. Discussed positioning in recliner chair and also positioning of pillows. Educated not to sit in chair longer than 45 minutes at a time (pt went back to bed at end of OT session). Educated on what pt could use for toilet aide if needed and suggested baby wipes. Educated on AE/cost/where they can purchase. Pt able to don/doff socks today while crossing legs-cues given for precautions (explained it can vary day to day on being able to cross legs over knees).     Vision     Perception     Praxis      Pertinent Vitals/Pain Pain Assessment: 0-10 Pain Score: 5  Pain Location: back Pain Descriptors / Indicators: Burning Pain Intervention(s): Repositioned;Monitored during session     Hand Dominance Right   Extremity/Trunk Assessment Upper Extremity Assessment Upper Extremity Assessment: Overall WFL for tasks assessed   Lower Extremity Assessment Lower Extremity Assessment: Defer to PT evaluation       Communication Communication Communication: No difficulties   Cognition Arousal/Alertness: Awake/alert Behavior During Therapy: WFL for tasks assessed/performed Overall Cognitive Status: Within Functional Limits for tasks assessed                     General Comments       Exercises       Shoulder Instructions  Home Living Family/patient expects to be discharged to:: Private residence Living Arrangements: Spouse/significant other;Children;Parent Available Help at Discharge: Family;Available 24 hours/day Type of Home: House Home Access: Stairs to enter [employer] of Steps: 6 Entrance Stairs-Rails: Right       Bathroom Shower/Tub: Walk-in shower         Home Equipment: Environmental consultant - 2 wheels;Walker - standard;Cane - single point;Bedside commode;Shower seat           Prior Functioning/Environment Level of Independence: Independent             OT Diagnosis: Acute pain   OT Problem List: Decreased knowledge of use of DME or AE;Decreased knowledge of precautions;Pain   OT Treatment/Interventions: Self-care/ADL training;DME and/or AE instruction;Therapeutic activities;Patient/family education;Balance training    OT Goals(Current goals can be found in the care plan section) Acute Rehab OT Goals Patient Stated Goal: not stated OT Goal Formulation: With patient Time For Goal Achievement: 11/22/14 Potential to Achieve Goals: Good ADL Goals Pt Will Perform Lower Body Dressing: with modified independence;sit to/from stand Pt Will Transfer to Toilet: with modified independence;ambulating (3 in 1 over commode) Pt Will Perform Toileting - Clothing Manipulation and hygiene: with modified independence;sit to/from stand Additional ADL Goal #1: Pt will independently verbalize and maintain 3/3 back precautions. Additional ADL Goal #2: Pt will don/doff back brace with setup assist.  OT Frequency: Min 2X/week   Barriers to D/C:            Co-evaluation              End of Session Equipment Utilized During Treatment: Gait belt;Oxygen;Back brace (Oxygen for part of session) Nurse Communication: Mobility status  Activity Tolerance: Patient tolerated treatment well Patient left: in bed;with call bell/phone within reach;with bed alarm set;with family/visitor present; SCDs reapplied   Time: 1645-1716 OT Time Calculation (min): 31 min Charges:  OT General Charges $OT Visit: 1 Procedure OT Evaluation $Initial OT Evaluation Tier I: 1 Procedure OT Treatments $Self Care/Home Management : 8-22 mins G-CodesEarlie Raveling OTR/L [phone] 11/15/2014, 5:33 PM

## 2014-11-15 NOTE — Progress Notes (Signed)
ANTIBIOTIC CONSULT NOTE - INITIAL  Pharmacy Consult for Vancomycin Indication: surgical prophylaxis  Allergies  Allergen Reactions  . Doxycycline Nausea Only    malaise   . Erythromycin Nausea And Vomiting  . Latex Other (See Comments)    Irritation of the skin  . Shellfish Allergy Other (See Comments)    GI- Facial  Acne  . Strawberry Other (See Comments)    Mouth ulcers   . Vicodin [Hydrocodone-Acetaminophen] Other (See Comments)    Restless, tolerate percocet  . Dilaudid [Hydromorphone Hcl] Rash    *Pt states she can take if given benadryl prior*  . Keflex [Cephalexin] Rash  . Penicillins Rash    Hives  . Sulfa Antibiotics Rash    Hives    Patient Measurements: Height:  (154.9 cm) Weight: 171 lb 12 oz (77.905 kg) IBW/kg (Calculated) : 47.8 Adjusted Body Weight:   Vital Signs: Temp: 98.7 F (37.1 C) (06/22 1602) Temp Source: Oral (06/22 1602) BP: 100/57 mmHg (06/22 1602) Pulse Rate: 93 (06/22 1602) Intake/Output from previous day:   Intake/Output from this shift: Total I/O In: 3100 [I.V.:2850; IV Piggyback:250] Out: 940 [Urine:540; Drains:100; Blood:300]  Labs: No results for input(s): WBC, HGB, PLT, LABCREA, CREATININE in the last 72 hours. Estimated Creatinine Clearance: 66.6 mL/min (by C-G formula based on Cr of 0.88). No results for input(s): VANCOTROUGH, VANCOPEAK, VANCORANDOM, GENTTROUGH, GENTPEAK, GENTRANDOM, TOBRATROUGH, TOBRAPEAK, TOBRARND, AMIKACINPEAK, AMIKACINTROU, AMIKACIN in the last 72 hours.   Microbiology: Recent Results (from the past 720 hour(s))  Surgical pcr screen     Status: Abnormal   Collection Time: 11/03/14  2:17 PM  Result Value Ref Range Status   MRSA, PCR NEGATIVE NEGATIVE Final   Staphylococcus aureus POSITIVE (A) NEGATIVE Final    Comment:        The Xpert SA Assay (FDA approved for NASAL specimens in patients over 40 years of age), is one component of a comprehensive surveillance program.  Test performance  has been validated by [facility] for patients greater than or equal to 49 year old. It is not intended to diagnose infection nor to guide or monitor treatment.     Medical History: Past Medical History  Diagnosis Date  . Hypertension   . Neck pain   . Anxiety   . Kidney stones     "have always passed them"  . Exertional asthma   . GERD (gastroesophageal reflux disease)   . Back pain   . Osteomyelitis 11/2013    osteomyelitis, discitis   . Sleep concern     uses Prozac for sleep   . Discitis of lumbar region 11/16/2013    L3-4/notes 11/24/2013, arms, neck  . Hepatitis     Medications:  Prescriptions prior to admission  Medication Sig Dispense Refill Last Dose  . cyclobenzaprine (FLEXERIL) 10 MG tablet Take 1 tablet (10 mg total) by mouth 3 (three) times daily. 90 tablet 5 11/15/2014 at Unknown time  . esomeprazole (NEXIUM) 40 MG capsule Take 40 mg by mouth every morning.    11/15/2014 at Unknown time  . EVENING PRIMROSE OIL PO Take 1,300 mg by mouth 2 (two) times daily.   11/14/2014 at Unknown time  . FLUoxetine (PROZAC) 20 MG capsule Take 20 mg by mouth daily before breakfast.    11/15/2014 at Unknown time  . gabapentin (NEURONTIN) 600 MG tablet Take 1 tablet (600 mg total) by mouth 3 (three) times daily. (Patient taking differently: Take 600-1,200 mg by mouth 3 (three) times daily. 600 mg in  the morning, 600 mg in the afternoon, and 1200 mg at bedtime) 90 tablet 5 11/15/2014 at Unknown time  . guaiFENesin (MUCINEX) 600 MG 12 hr tablet Take 600 mg by mouth as needed for cough or to loosen phlegm.   11/14/2014 at Unknown time  . lisinopril-hydrochlorothiazide (PRINZIDE,ZESTORETIC) 10-12.5 MG per tablet Take 1 tablet by mouth daily.   11/15/2014 at Unknown time  . loratadine (CLARITIN) 10 MG tablet Take 10 mg by mouth every morning.    11/14/2014 at Unknown time  . naproxen (NAPROSYN) 500 MG tablet Take 1 tablet (500 mg total) by mouth 2 (two) times daily with a meal. 60 tablet 5 Past Week  at Unknown time  . Omega-3 Fatty Acids (FISH OIL PO) Take 1,500 mg by mouth 2 (two) times daily.   Past Week at Unknown time  . ondansetron (ZOFRAN) 8 MG tablet Take 8 mg by mouth every 8 (eight) hours as needed for nausea or vomiting.   11/14/2014 at Unknown time  . OVER THE COUNTER MEDICATION Take 1-3 tablets by mouth daily as needed (for constipation). "Super Cleanse"     . oxyCODONE-acetaminophen (PERCOCET) 5-325 MG per tablet 1 to 2 tablets every 6 hours as needed for pain. 60 tablet 0 Past Week at Unknown time  . oxymorphone (OPANA ER) 15 MG 12 hr tablet Take 15 mg by mouth every 12 (twelve) hours.   11/15/2014 at Unknown time  . potassium chloride SA (K-DUR,KLOR-CON) 20 MEQ tablet Take 20 mEq by mouth daily.   11/15/2014 at Unknown time  . Probiotic CAPS Take 1 capsule by mouth 2 (two) times daily.   11/14/2014 at Unknown time  . Rhubarb (ESTROVERA PO) Take 1 tablet by mouth daily.   11/14/2014 at Unknown time  . zolpidem (AMBIEN) 10 MG tablet Take 10 mg by mouth at bedtime as needed for sleep.   Past Month at Unknown time   Scheduled:  . cyclobenzaprine      . diphenhydrAMINE      . fentaNYL      . fentaNYL      . [START ON 11/16/2014] FLUoxetine  20 mg Oral QAC breakfast  . gabapentin  600 mg Oral TID  . HYDROmorphone      . HYDROmorphone      . HYDROmorphone      . lisinopril-hydrochlorothiazide  1 tablet Oral Daily  . midazolam      . oxyCODONE-acetaminophen      . [START ON 11/16/2014] pantoprazole  40 mg Oral Daily  . senna  1 tablet Oral BID  . sodium chloride  3 mL Intravenous Q12H   Infusions:  . sodium chloride    . 0.9 % NaCl with KCl 20 mEq / L     Assessment: 59yo female her for laminectomy and foraminotomy. Pharmacy is consulted to dose vancomycin for surgical prophylaxis. Pt does have a hemovac in place. Pt is afebrile and sCr 1.1.  Goal of Therapy:  Prevention of infection  Plan:  Vancomycin 1g IV once followed by 750mg  q12h Monitor renal function, clinical  course and LOT  Arlean Hopping. Newman Pies, PharmD Clinical Pharmacist Pager [phone] 11/15/2014,4:20 PM

## 2014-11-15 NOTE — Op Note (Signed)
11/15/2014  1:08 PM  PATIENT:  Chloe Johnson  59 y.o. female  PRE-OPERATIVE DIAGNOSIS:  Lumbar spinal stenosis L3-4 and L4-5, previous discitis L3-4 with destruction of disc space and resulting spinal deformity  POST-OPERATIVE DIAGNOSIS:  Same  PROCEDURE:   1. Decompressive lumbar laminectomy L3-4 and L4-5 in order to adequately decompress the neural elements and address the spinal stenosis 2. Posterior fixation L2-L5 inclusive using cortical pedicle screws.  3. Intertransverse arthrodesis L2-L5 inclusive using morcellized autograft and allograft.  SURGEON:  Marikay Alar, MD  ASSISTANTS: Dr. Wynetta Emery  ANESTHESIA:  General  EBL: 250 ml  Total I/O In: 2850 [I.V.:2600; IV Piggyback:250] Out: 540 [Urine:240; Blood:300]  BLOOD ADMINISTERED:none  DRAINS: Hemovac   INDICATION FOR PROCEDURE: This patient had spinal stenosis at L3-4 and L4-5. 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 She had destruction of her L3-4 disc space with resulting flatback syndrome and spinal deformity. Management for quite a long time with severe back and bilateral leg pain. I recommended decompression and instrument effusion to address her spinal deformity as well as her spinal stenosis. Her pain was debilitating. Patient understood the risks, benefits, and alternatives and potential outcomes and wished to proceed.  PROCEDURE DETAILS:  The patient was brought to the operating room. After induction of generalized endotracheal anesthesia the patient was rolled into the prone position on chest rolls and all pressure points were padded. The patient's lumbar region was cleaned and then prepped with DuraPrep and draped in the usual sterile fashion. Anesthesia was injected and then a dorsal midline incision was made and carried down to the lumbosacral fascia. The fascia was opened and the paraspinous musculature was taken down in a subperiosteal fashion to expose L2-L5.  A self-retaining retractor was placed. Intraoperative fluoroscopy confirmed my level, and I started with placement of the L5 cortical pedicle screws. The pedicle screw entry zones were identified utilizing surface landmarks and  AP and lateral fluoroscopy. I scored the cortex with the high-speed drill and then used the hand drill and EMG monitoring to drill an upward and outward direction into the pedicle. I then tapped line to line, and the tap was also monitored. I then placed a 5-0 x 35 mm cortical pedicle screw into the pedicles of L5 bilaterally. I then placed my L2-L4 screws bilaterally in succession utilizing exact same technique. Then checked my screw placement with AP and lateral fluoroscopy. I then turned my attention to the decompression and the spinous process was removed and complete lumbar laminectomies, medial facetectomies, and foraminotomies were performed at L3-4 and L4-5. A generous decompression was undertaken in order to adequately decompress the neural elements and address the patient's leg pain. The yellow ligament was removed to expose the underlying dura and nerve roots, and generous foraminotomies were performed to adequately decompress the neural elements. Both the exiting and traversing nerve roots were decompressed on both sides until a coronary dilator passed easily along the nerve roots.   We then decorticated the transverse processes and laid a mixture of morcellized autograft and allograft out over these to perform intertransverse arthrodesis at L2-L5 bilaterally. We then placed lordotic rods into the multiaxial screw heads of the pedicle screws and locked these in position with the locking caps and anti-torque device. We then checked our construct with AP and lateral fluoroscopy. Irrigated with copious amounts of bacitracin-containing saline solution. Placed a medium Hemovac drain through separate stab incision. Inspected the nerve roots once again to assure  adequate decompression,  lined to the dura with Gelfoam, and closed the muscle and the fascia with 0 Vicryl. Closed the subcutaneous tissues with 2-0 Vicryl and subcuticular tissues with 3-0 Vicryl. The skin was closed with benzoin and Steri-Strips. Dressing was then applied, the patient was awakened from general anesthesia and transported to the recovery room in stable condition. At the end of the procedure all sponge, needle and instrument counts were correct.   PLAN OF CARE: Admit to inpatient   PATIENT DISPOSITION:  PACU - hemodynamically stable.   Delay start of Pharmacological VTE agent (>24hrs) due to surgical blood loss or risk of bleeding:  yes

## 2014-11-15 NOTE — Anesthesia Postprocedure Evaluation (Signed)
  Anesthesia Post-op Note  Patient: Chloe Johnson  Procedure(s) Performed: Procedure(s): Laminectomy and Foraminotomy - Lumbar three-lumbar four, lumbar four-five  Posterior Lateral Fusion Lumbar two to lumbar five, segmental instrumentation Lumbar two  to Lumbar five (N/A)  Patient Location: PACU  Anesthesia Type:General  Level of Consciousness: awake, oriented, sedated and patient cooperative  Airway and Oxygen Therapy: Patient Spontanous Breathing  Post-op Pain: mild  Post-op Assessment: Post-op Vital signs reviewed, Patient's Cardiovascular Status Stable, Respiratory Function Stable, Patent Airway, No signs of Nausea or vomiting and Pain level controlled   LLE Sensation: Full sensation   RLE Sensation: Full sensation      Post-op Vital Signs: stable  Last Vitals:  Filed Vitals:   11/15/14 1320  BP:   Pulse:   Temp: 37.1 C  Resp:     Complications: No apparent anesthesia complications

## 2014-11-15 NOTE — OR Nursing (Signed)
Needle electrodes removed

## 2014-11-15 NOTE — OR Nursing (Signed)
nuvasive needle electrodes placed in upper trunk ,lower extremities for nerve monitoring after induction to be removed at end of procedure

## 2014-11-15 NOTE — Transfer of Care (Signed)
Immediate Anesthesia Transfer of Care Note  Patient: Chloe Johnson  Procedure(s) Performed: Procedure(s): Laminectomy and Foraminotomy - Lumbar three-lumbar four, lumbar four-five  Posterior Lateral Fusion Lumbar two to lumbar five, segmental instrumentation Lumbar two  to Lumbar five (N/A)  Patient Location: PACU  Anesthesia Type:General  Level of Consciousness: awake, alert  and oriented  Airway & Oxygen Therapy: Patient Spontanous Breathing and Patient connected to nasal cannula oxygen  Post-op Assessment: Report given to RN and Post -op Vital signs reviewed and stable  Post vital signs: Reviewed and stable  Last Vitals:  Filed Vitals:   11/15/14 0812  BP: 111/76  Pulse: 76  Temp: 36.6 C  Resp: 20    Complications: No apparent anesthesia complications

## 2014-11-15 NOTE — Anesthesia Preprocedure Evaluation (Signed)
Anesthesia Evaluation  Patient identified by MRN, date of birth, ID band Patient awake    Reviewed: Allergy & Precautions, NPO status   Airway Mallampati: I       Dental   Pulmonary asthma ,    Pulmonary exam normal       Cardiovascular hypertension, Normal cardiovascular exam    Neuro/Psych    GI/Hepatic GERD-  ,(+) Hepatitis -, C  Endo/Other    Renal/GU Renal disease     Musculoskeletal  (+) Arthritis -,   Abdominal   Peds  Hematology   Anesthesia Other Findings   Reproductive/Obstetrics                             Anesthesia Physical Anesthesia Plan  ASA: II  Anesthesia Plan: General   Post-op Pain Management:    Induction: Intravenous  Airway Management Planned: Oral ETT  Additional Equipment:   Intra-op Plan:   Post-operative Plan: Extubation in OR  Informed Consent: I have reviewed the patients History and Physical, chart, labs and discussed the procedure including the risks, benefits and alternatives for the proposed anesthesia with the patient or authorized representative who has indicated his/her understanding and acceptance.     Plan Discussed with: CRNA, Anesthesiologist and Surgeon  Anesthesia Plan Comments:         Anesthesia Quick Evaluation

## 2014-11-16 LAB — CBC
HCT: 28.1 % — ABNORMAL LOW (ref 36.0–46.0)
Hemoglobin: 9.1 g/dL — ABNORMAL LOW (ref 12.0–15.0)
MCH: 27.2 pg (ref 26.0–34.0)
MCHC: 32.4 g/dL (ref 30.0–36.0)
MCV: 84.1 fL (ref 78.0–100.0)
Platelets: 143 10*3/uL — ABNORMAL LOW (ref 150–400)
RBC: 3.34 MIL/uL — AB (ref 3.87–5.11)
RDW: 13.2 % (ref 11.5–15.5)
WBC: 7.5 10*3/uL (ref 4.0–10.5)

## 2014-11-16 MED ORDER — OXYMORPHONE HCL ER 15 MG PO TB12
15.0000 mg | ORAL_TABLET | Freq: Two times a day (BID) | ORAL | Status: DC
  Administered 2014-11-16 – 2014-11-17 (×2): 15 mg via ORAL

## 2014-11-16 MED ORDER — POLYETHYLENE GLYCOL 3350 17 G PO PACK
17.0000 g | PACK | Freq: Every day | ORAL | Status: DC
  Administered 2014-11-16: 17 g via ORAL
  Filled 2014-11-16 (×2): qty 1

## 2014-11-16 NOTE — Progress Notes (Signed)
Subjective: Patient reports she is doing well. She has appropriate back soreness with no leg pain. She has been out of bed 5 times.  Objective: Vital signs in last 24 hours: Temp:  [98.2 F (36.8 C)-98.8 F (37.1 C)] 98.2 F (36.8 C) (06/23 0608) Pulse Rate:  [75-101] 81 (06/23 0608) Resp:  [8-20] 18 (06/23 0608) BP: (91-123)/(46-82) 123/66 mmHg (06/23 0608) SpO2:  [84 %-100 %] 98 % (06/23 0608)  Intake/Output from previous day: 06/22 0730 - 06/23 0729 In: 3100 [I.V.:2850; IV Piggyback:250] Out: 4050 [Urine:3390; Drains:360; Blood:300] Intake/Output this shift:    Neurologic: Grossly normal  Lab Results: Lab Results  Component Value Date   WBC 7.5 11/16/2014   HGB 9.1* 11/16/2014   HCT 28.1* 11/16/2014   MCV 84.1 11/16/2014   PLT 143* 11/16/2014   Lab Results  Component Value Date   INR 1.03 11/03/2014   BMET Lab Results  Component Value Date   NA 136 11/15/2014   K 3.8 11/15/2014   CL 100* 11/15/2014   CO2 24 11/15/2014   GLUCOSE 171* 11/15/2014   BUN 10 11/15/2014   CREATININE 1.10* 11/15/2014   CALCIUM 8.8* 11/15/2014    Studies/Results: Dg Lumbar Spine 2-3 Views  11/15/2014   CLINICAL DATA:  59 year old female undergoing lumbar surgery. Initial encounter.  EXAM: DG C-ARM 61-120 MIN; LUMBAR SPINE - 2-3 VIEW  TECHNIQUE: Two intraoperative fluoroscopic views of the lower lumbar spine  CONTRAST:  None  FLUOROSCOPY TIME:  Radiation Exposure Index (as provided by the fluoroscopic device):  If the device does not provide the exposure index:  Fluoroscopy Time (in minutes and seconds):  1 minutes 54 seconds  Number of Acquired Images:  None  COMPARISON:  [facility] Lumbar MRI 09/22/2014  FINDINGS: Same numbering system as on the MRI comparison. These images demonstrate transpedicular screw placement at the L2 through L5 vertebral bodies, and posterior decompression at L3-L4.  IMPRESSION: L2 through L5 operative changes.   Electronically Signed    By: Odessa Fleming M.D.   On: 11/15/2014 15:08   Dg C-arm 61-120 Min  11/15/2014   CLINICAL DATA:  59 year old female undergoing lumbar surgery. Initial encounter.  EXAM: DG C-ARM 61-120 MIN; LUMBAR SPINE - 2-3 VIEW  TECHNIQUE: Two intraoperative fluoroscopic views of the lower lumbar spine  CONTRAST:  None  FLUOROSCOPY TIME:  Radiation Exposure Index (as provided by the fluoroscopic device):  If the device does not provide the exposure index:  Fluoroscopy Time (in minutes and seconds):  1 minutes 54 seconds  Number of Acquired Images:  None  COMPARISON:  [facility] Lumbar MRI 09/22/2014  FINDINGS: Same numbering system as on the MRI comparison. These images demonstrate transpedicular screw placement at the L2 through L5 vertebral bodies, and posterior decompression at L3-L4.  IMPRESSION: L2 through L5 operative changes.   Electronically Signed   By: Odessa Fleming M.D.   On: 11/15/2014 15:08    Assessment/Plan: Doing well. Continue to mobilize. Probably home tomorrow.   LOS: 1 day    Moritz Lever S 11/16/2014, 9:42 AM

## 2014-11-16 NOTE — Evaluation (Signed)
Physical Therapy Evaluation Patient Details Name: Chloe Johnson MRN: [id number] DOB: 05/20/1956 Today's Date: 11/16/2014   History of Present Illness  59 y.o. s/p Laminectomy and Foraminotomy - Lumbar three-lumbar four, lumbar four-five Posterior Lateral Fusion Lumbar two to lumbar five, segmental instrumentation Lumbar two to Lumbar five.  Clinical Impression  Pt moving well and very receptive to safety cues and back precaution education.  Feel pt is ready for D/C from a PT stand point.  Will check back if remains on acute.      Follow Up Recommendations Home health PT;Supervision - Intermittent    Equipment Recommendations  None recommended by PT    Recommendations for Other Services       Precautions / Restrictions Precautions Precautions: Back;Fall Precaution Booklet Issued: No Precaution Comments: HAndout given with OT.  pt able to verbalize back precautions. Required Braces or Orthoses: Spinal Brace Spinal Brace: Lumbar corset;Applied in sitting position Restrictions Weight Bearing Restrictions: No      Mobility  Bed Mobility Overal bed mobility: Modified Independent             General bed mobility comments: pt able to perform log roll without A or cueing.  Worked on bed mobility with bed elevated to simulate taller bed at home.    Transfers Overall transfer level: Modified independent Equipment used: None Transfers: Sit to/from Stand Sit to Stand: Modified independent (Device/Increase time)            Ambulation/Gait Ambulation/Gait assistance: Modified independent (Device/Increase time) Ambulation Distance (Feet): 500 Feet Assistive device: None Gait Pattern/deviations: Step-through pattern;Decreased stride length     General Gait Details: pt moving well and with good awareness of safety, but does need cues to reinforce back precautions during mobility.    Stairs Stairs: Yes Stairs assistance: Supervision Stair Management: One rail Right;Step  to pattern;Forwards Number of Stairs: 5 General stair comments: pt moves slowly, but demonstrates good safety on stairs.    Wheelchair Mobility    Modified Rankin (Stroke Patients Only)       Balance Overall balance assessment: No apparent balance deficits (not formally assessed)                                           Pertinent Vitals/Pain Pain Assessment: No/denies pain    Home Living Family/patient expects to be discharged to:: Private residence Living Arrangements: Spouse/significant other;Children;Parent Available Help at Discharge: Family;Available 24 hours/day Type of Home: House Home Access: Stairs to enter Entrance Stairs-Rails: Right Entrance Stairs-Number of Steps: 6   Home Equipment: Walker - 2 wheels;Walker - standard;Cane - single point;Bedside commode;Shower seat      Prior Function Level of Independence: Independent               Hand Dominance   Dominant Hand: Right    Extremity/Trunk Assessment   Upper Extremity Assessment: Defer to OT evaluation           Lower Extremity Assessment: Overall WFL for tasks assessed         Communication   Communication: No difficulties  Cognition Arousal/Alertness: Awake/alert Behavior During Therapy: WFL for tasks assessed/performed Overall Cognitive Status: Within Functional Limits for tasks assessed                      General Comments      Exercises  Assessment/Plan    PT Assessment Patient needs continued PT services  PT Diagnosis Difficulty walking   PT Problem List Decreased activity tolerance;Decreased mobility;Decreased knowledge of precautions  PT Treatment Interventions DME instruction;Gait training;Stair training;Functional mobility training;Therapeutic activities;Therapeutic exercise;Balance training;Neuromuscular re-education;Patient/family education   PT Goals (Current goals can be found in the Care Plan section) Acute Rehab PT  Goals Patient Stated Goal: to go home PT Goal Formulation: With patient Time For Goal Achievement: 11/23/14 Potential to Achieve Goals: Good    Frequency Min 5X/week   Barriers to discharge        Co-evaluation               End of Session   Activity Tolerance: Patient tolerated treatment well Patient left: in bed;with call bell/phone within reach;with family/visitor present Nurse Communication: Mobility status         Time: 6160-7371 PT Time Calculation (min) (ACUTE ONLY): 40 min   Charges:   PT Evaluation $Initial PT Evaluation Tier I: 1 Procedure PT Treatments $Gait Training: 8-22 mins $Therapeutic Activity: 8-22 mins   PT G CodesSunny Johnson, Chloe Johnson [phone] 11/16/2014, 2:38 PM

## 2014-11-16 NOTE — Progress Notes (Signed)
Occupational Therapy Treatment Patient Details Name: Chloe Johnson MRN: [id number] DOB: 06/18/1955 Today's Date: 11/16/2014    History of present illness 59 y.o. s/p Laminectomy and Foraminotomy - Lumbar three-lumbar four, lumbar four-five Posterior Lateral Fusion Lumbar two to lumbar five, segmental instrumentation Lumbar two to Lumbar five.   OT comments  Pt denies pain and eager to participate in therapy this morning. Pt unable to recall precautions; however able to recall education from previous OT session and able to maintain precautions during grooming and toileting tasks. Education and handout provided on back precautions and use of AE to A with toileting and LB dressing. Pt demonstrated ability to don brace, and complete grooming and toileting tasks with supervision.  Pt reports spouse and family available upon d/c to A with ADLs when needed. No further OT needed at this time.    Follow Up Recommendations  No OT follow up;Supervision - Intermittent    Equipment Recommendations  None recommended by OT    Recommendations for Other Services      Precautions / Restrictions Precautions Precautions: Back;Fall Precaution Booklet Issued: Yes (comment) Precaution Comments: handout given and reviewed with Pt and spouse Required Braces or Orthoses: Spinal Brace Spinal Brace: Lumbar corset;Applied in sitting position Restrictions Weight Bearing Restrictions: No       Mobility Bed Mobility Overal bed mobility: Modified Independent Bed Mobility: Rolling;Sidelying to Sit Rolling: Modified independent (Device/Increase time) Sidelying to sit: Modified independent (Device/Increase time)       General bed mobility comments: VC's given  Transfers Overall transfer level: Needs assistance   Transfers: Sit to/from Stand Sit to Stand: Modified independent (Device/Increase time)              Balance Overall balance assessment: No apparent balance deficits (not formally  assessed)                                 ADL Overall ADL's : Needs assistance/impaired Eating/Feeding: Independent;Sitting   Grooming: Wash/dry hands;Wash/dry face;Oral care;Applying deodorant;Brushing hair;Supervision/safety;Standing           Upper Body Dressing : Supervision/safety;Sitting (don brace)   Lower Body Dressing: Supervision/safety;Sit to/from stand (don socks)   Toilet Transfer: Supervision/safety;Ambulation;Regular Toilet   Toileting- Clothing Manipulation and Hygiene: Supervision/safety;Cueing for back precautions;Sit to/from stand Toileting - Clothing Manipulation Details (indicate cue type and reason): Education provided on use of toilet tongs to A with maintaining precautions     Functional mobility during ADLs: Supervision/safety General ADL Comments: Pt unable to verbalise precautions; but able to recall education from previous session and demonstrate ability to maintain precautions during ADLs. Education provided on use of AE for toileting and LB dressing.       Vision                     Perception     Praxis      Cognition   Behavior During Therapy: [facility] for tasks assessed/performed Overall Cognitive Status: Within Functional Limits for tasks assessed       Memory: Decreased recall of precautions               Extremity/Trunk Assessment               Exercises     Shoulder Instructions       General Comments      Pertinent Vitals/ Pain       Pain Assessment: No/denies pain  Home  Living                                          Prior Functioning/Environment              Frequency Min 2X/week     Progress Toward Goals  OT Goals(current goals can now be found in the care plan section)  Progress towards OT goals: Progressing toward goals  Acute Rehab OT Goals Patient Stated Goal: to go home OT Goal Formulation: With patient Time For Goal Achievement: 11/22/14 Potential  to Achieve Goals: Good ADL Goals Pt Will Perform Lower Body Dressing: with modified independence;sit to/from stand Pt Will Transfer to Toilet: with modified independence;ambulating Pt Will Perform Toileting - Clothing Manipulation and hygiene: with modified independence;sit to/from stand Additional ADL Goal #1: Pt will independently verbalize and maintain 3/3 back precautions. Additional ADL Goal #2: Pt will don/doff back brace with setup assist.  Plan Discharge plan remains appropriate    Co-evaluation                 End of Session Equipment Utilized During Treatment: Gait belt;Back brace   Activity Tolerance Patient tolerated treatment well;No increased pain   Patient Left with call bell/phone within reach;with family/visitor present;in bed   Nurse Communication Mobility status;Precautions        Time: 2811-8867 OT Time Calculation (min): 29 min  Charges: OT General Charges $OT Visit: 1 Procedure OT Treatments $Self Care/Home Management : 23-37 mins  Marden Noble 11/16/2014, 8:58 AM

## 2014-11-16 NOTE — Progress Notes (Signed)
Patient arrived about 1530 on 6/22 alert and oriented and vital signs are stable from pacu she had a L2-5 PLIF pain was under control she had SCD's on and LR going through her IV, husband was with her. Will continue to monitor.

## 2014-11-16 NOTE — Care Management Note (Signed)
Case Management Note  Patient Details  Name: Chloe Johnson MRN: [id number] Date of Birth: December 28, 1955  Subjective/Objective:                    Action/Plan: Met with patient and husband to discuss discharge planning. Patient has had home health in the past and has requested to be set up with [facility] again. Patient states she already has all necessary DME from prior admissions.  Expected Discharge Date:                  Expected Discharge Plan:   (from home with spouse)  In-House Referral:     Discharge planning Services  CM Consult  Post Acute Care Choice:  Home Health Choice offered to:  Patient  DME Arranged:    DME Agency:     HH Arranged:  PT [place]:  [place]  Status of Service:  Completed, signed off  Medicare Important Message Given:    Date Medicare IM Given:    Medicare IM give by:    Date Additional Medicare IM Given:    Additional Medicare Important Message give by:     If discussed at [place] of Stay Meetings, dates discussed:    Additional Comments:  Rolm Baptise, RN 11/16/2014, 3:37 PM

## 2014-11-17 LAB — CBC
HCT: 26.3 % — ABNORMAL LOW (ref 36.0–46.0)
Hemoglobin: 8.5 g/dL — ABNORMAL LOW (ref 12.0–15.0)
MCH: 27.2 pg (ref 26.0–34.0)
MCHC: 32.3 g/dL (ref 30.0–36.0)
MCV: 84.3 fL (ref 78.0–100.0)
PLATELETS: 142 10*3/uL — AB (ref 150–400)
RBC: 3.12 MIL/uL — AB (ref 3.87–5.11)
RDW: 13.6 % (ref 11.5–15.5)
WBC: 5.6 10*3/uL (ref 4.0–10.5)

## 2014-11-17 MED ORDER — OXYCODONE-ACETAMINOPHEN 5-325 MG PO TABS: ORAL_TABLET | ORAL | Status: DC

## 2014-11-17 MED ORDER — CYCLOBENZAPRINE HCL 10 MG PO TABS: 10.0000 mg | ORAL_TABLET | Freq: Three times a day (TID) | ORAL | Status: DC | PRN

## 2014-11-17 MED FILL — Sodium Chloride IV Soln 0.9%: INTRAVENOUS | Qty: 2000 | Status: AC

## 2014-11-17 MED FILL — Heparin Sodium (Porcine) Inj 1000 Unit/ML: INTRAMUSCULAR | Qty: 30 | Status: AC

## 2014-11-17 NOTE — Discharge Summary (Signed)
Physician Discharge Summary  Patient ID: Chloe Johnson : [id number] DOB/AGE: 57/16/1957 57 y.o.  Admit date: 11/15/2014 Discharge date: 11/17/2014  Admission Diagnoses: spinal stenosis, deformity    Discharge Diagnoses: same   Discharged Condition: good  Hospital Course: The patient was admitted on 11/15/2014 and taken to the operating room where the patient underwent decompression and fusion L2-5. The patient tolerated the procedure well and was taken to the recovery room and then to the floor in stable condition. The hospital course was routine. There were no complications. The wound remained clean dry and intact. Pt had appropriate back soreness. No complaints of leg pain or new N/T/W. The patient remained afebrile with stable vital signs, and tolerated a regular diet. The patient continued to increase activities, and pain was well controlled with oral pain medications.   Consults: None  Significant Diagnostic Studies:  Results for orders placed or performed during the hospital encounter of 11/15/14  Basic metabolic panel  Result Value Ref Range   Sodium 136 135 - 145 mmol/L   Potassium 3.8 3.5 - 5.1 mmol/L   Chloride 100 (L) 101 - 111 mmol/L   CO2 24 22 - 32 mmol/L   Glucose, Bld 171 (H) 65 - 99 mg/dL   BUN 10 6 - 20 mg/dL   Creatinine, Ser 6.57 (H) 0.44 - 1.00 mg/dL   Calcium 8.8 (L) 8.9 - 10.3 mg/dL   GFR calc non Af Amer 55 (L) >60 mL/min   GFR calc Af Amer >60 >60 mL/min   Anion gap 12 5 - 15  CBC  Result Value Ref Range   WBC 7.5 4.0 - 10.5 K/uL   RBC 3.34 (L) 3.87 - 5.11 MIL/uL   Hemoglobin 9.1 (L) 12.0 - 15.0 g/dL   HCT 84.6 (L) 96.2 - 95.2 %   MCV 84.1 78.0 - 100.0 fL   MCH 27.2 26.0 - 34.0 pg   MCHC 32.4 30.0 - 36.0 g/dL   RDW 84.1 32.4 - 40.1 %   Platelets 143 (L) 150 - 400 K/uL  CBC  Result Value Ref Range   WBC 5.6 4.0 - 10.5 K/uL   RBC 3.12 (L) 3.87 - 5.11 MIL/uL   Hemoglobin 8.5 (L) 12.0 - 15.0 g/dL   HCT 02.7 (L) 25.3 - 66.4 %   MCV 84.3 78.0 -  100.0 fL   MCH 27.2 26.0 - 34.0 pg   MCHC 32.3 30.0 - 36.0 g/dL   RDW 40.3 47.4 - 25.9 %   Platelets 142 (L) 150 - 400 K/uL    Chest 2 View  11/03/2014   CLINICAL DATA:  Lumbar spine surgery.  EXAM: CHEST  2 VIEW  COMPARISON:  None.  FINDINGS: Mediastinum and hilar structures normal. Lungs are clear. No pleural effusion or pneumothorax. Heart size normal. No acute bony abnormality.  IMPRESSION: No acute cardiopulmonary disease.   Electronically Signed   By: Maisie Fus  Register   On: 11/03/2014 14:37   Dg Lumbar Spine 2-3 Views  11/15/2014   CLINICAL DATA:  59 year old female undergoing lumbar surgery. Initial encounter.  EXAM: DG C-ARM 61-120 MIN; LUMBAR SPINE - 2-3 VIEW  TECHNIQUE: Two intraoperative fluoroscopic views of the lower lumbar spine  CONTRAST:  None  FLUOROSCOPY TIME:  Radiation Exposure Index (as provided by the fluoroscopic device):  If the device does not provide the exposure index:  Fluoroscopy Time (in minutes and seconds):  1 minutes 54 seconds  Number of Acquired Images:  None  COMPARISON:  [facility] Lumbar  MRI 09/22/2014  FINDINGS: Same numbering system as on the MRI comparison. These images demonstrate transpedicular screw placement at the L2 through L5 vertebral bodies, and posterior decompression at L3-L4.  IMPRESSION: L2 through L5 operative changes.   Electronically Signed   By: Odessa Fleming M.D.   On: 11/15/2014 15:08   Dg C-arm 61-120 Min  11/15/2014   CLINICAL DATA:  59 year old female undergoing lumbar surgery. Initial encounter.  EXAM: DG C-ARM 61-120 MIN; LUMBAR SPINE - 2-3 VIEW  TECHNIQUE: Two intraoperative fluoroscopic views of the lower lumbar spine  CONTRAST:  None  FLUOROSCOPY TIME:  Radiation Exposure Index (as provided by the fluoroscopic device):  If the device does not provide the exposure index:  Fluoroscopy Time (in minutes and seconds):  1 minutes 54 seconds  Number of Acquired Images:  None  COMPARISON:  [facility]  Lumbar MRI 09/22/2014  FINDINGS: Same numbering system as on the MRI comparison. These images demonstrate transpedicular screw placement at the L2 through L5 vertebral bodies, and posterior decompression at L3-L4.  IMPRESSION: L2 through L5 operative changes.   Electronically Signed   By: Odessa Fleming M.D.   On: 11/15/2014 15:08    Antibiotics:  Anti-infectives    Start     Dose/Rate Route Frequency Ordered Stop   11/16/14 0530  vancomycin (VANCOCIN) IVPB 750 mg/150 ml premix     750 mg 150 mL/hr over 60 Minutes Intravenous Every 12 hours 11/15/14 1818     11/15/14 1630  vancomycin (VANCOCIN) IVPB 1000 mg/200 mL premix     1,000 mg 200 mL/hr over 60 Minutes Intravenous  Once 11/15/14 1624 11/15/14 1830   11/15/14 0900  bacitracin 50,000 Units in sodium chloride irrigation 0.9 % 500 mL irrigation  Status:  Discontinued       As needed 11/15/14 1031 11/15/14 1313   11/15/14 0830  vancomycin (VANCOCIN) IVPB 1000 mg/200 mL premix     1,000 mg 200 mL/hr over 60 Minutes Intravenous To [facility] 11/14/14 1225 11/15/14 1015      Discharge Exam: Blood pressure 113/66, pulse 90, temperature 99.1 F (37.3 C), temperature source Oral, resp. rate 18, height 5\' 1"  (1.549 m), weight 171 lb 12 oz (77.905 kg), SpO2 99 %. Neurologic: Grossly normal Incision CDI  Discharge Medications:     Medication List    STOP taking these medications        naproxen 500 MG tablet  Commonly known as:  NAPROSYN      TAKE these medications        cyclobenzaprine 10 MG tablet  Commonly known as:  FLEXERIL  Take 1 tablet (10 mg total) by mouth 3 (three) times daily as needed for muscle spasms.     esomeprazole 40 MG capsule  Commonly known as:  NEXIUM  Take 40 mg by mouth every morning.     ESTROVERA PO  Take 1 tablet by mouth daily.     EVENING PRIMROSE OIL PO  Take 1,300 mg by mouth 2 (two) times daily.     FISH OIL PO  Take 1,500 mg by mouth 2 (two) times daily.     FLUoxetine 20 MG capsule   Commonly known as:  PROZAC  Take 20 mg by mouth daily before breakfast.     gabapentin 600 MG tablet  Commonly known as:  NEURONTIN  Take 1 tablet (600 mg total) by mouth 3 (three) times daily.     guaiFENesin 600 MG 12 hr tablet  Commonly known as:  MUCINEX  Take 600 mg by mouth as needed for cough or to loosen phlegm.     lisinopril-hydrochlorothiazide 10-12.5 MG per tablet  Commonly known as:  PRINZIDE,ZESTORETIC  Take 1 tablet by mouth daily.     loratadine 10 MG tablet  Commonly known as:  CLARITIN  Take 10 mg by mouth every morning.     ondansetron 8 MG tablet  Commonly known as:  ZOFRAN  Take 8 mg by mouth every 8 (eight) hours as needed for nausea or vomiting.     OVER THE COUNTER MEDICATION  Take 1-3 tablets by mouth daily as needed (for constipation). "Super Cleanse"     oxyCODONE-acetaminophen 5-325 MG per tablet  Commonly known as:  PERCOCET  1 to 2 tablets every 6 hours as needed for pain.     oxymorphone 15 MG 12 hr tablet  Commonly known as:  OPANA ER  Take 15 mg by mouth every 12 (twelve) hours.     potassium chloride SA 20 MEQ tablet  Commonly known as:  K-DUR,KLOR-CON  Take 20 mEq by mouth daily.     Probiotic Caps  Take 1 capsule by mouth 2 (two) times daily.     zolpidem 10 MG tablet  Commonly known as:  AMBIEN  Take 10 mg by mouth at bedtime as needed for sleep.        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      Discharge Instructions    Call MD for:  difficulty breathing, headache or visual disturbances    Complete by:  As directed      Call MD for:  persistant nausea and vomiting    Complete by:  As directed      Call MD for:  redness, tenderness, or signs of infection (pain, swelling, redness, odor or green/yellow discharge around incision site)    Complete by:  As directed      Call MD for:  severe uncontrolled pain    Complete by:  As directed      Call MD for:  temperature >100.4    Complete by:  As directed       Diet - low sodium heart healthy    Complete by:  As directed      Discharge instructions    Complete by:  As directed   No driving, no heavy lifting, may shower, no bending or twisting     Increase activity slowly    Complete by:  As directed            Follow-up Information    Follow up with JONES,DAVID S, MD. Schedule an appointment as soon as possible for a visit in 2 weeks.   Specialty:  Neurosurgery   Contact information:   [address] [place] [address] [phone]        Signed: Tia Alert 11/17/2014, 9:19 AM

## 2014-11-17 NOTE — Progress Notes (Addendum)
Pt c/o 7/10 headache this am, reports that it began suddenly yesterday (6/23 with ambulation). She reports the pain originates as anterior headache and radiates up and back down to her posterior neck.  She reports exacerbation with ambulation.  Will notify MD upon rounds.

## 2015-09-10 DIAGNOSIS — M4802 Spinal stenosis, cervical region: Secondary | ICD-10-CM | POA: Diagnosis not present

## 2015-09-10 DIAGNOSIS — M4806 Spinal stenosis, lumbar region: Secondary | ICD-10-CM | POA: Diagnosis not present

## 2015-11-12 DIAGNOSIS — M4646 Discitis, unspecified, lumbar region: Secondary | ICD-10-CM | POA: Diagnosis not present

## 2015-12-18 DIAGNOSIS — R350 Frequency of micturition: Secondary | ICD-10-CM | POA: Diagnosis not present

## 2016-01-08 DIAGNOSIS — M545 Low back pain: Secondary | ICD-10-CM | POA: Diagnosis not present

## 2016-01-08 DIAGNOSIS — M542 Cervicalgia: Secondary | ICD-10-CM | POA: Diagnosis not present

## 2016-01-08 DIAGNOSIS — M25561 Pain in right knee: Secondary | ICD-10-CM | POA: Diagnosis not present

## 2016-01-08 DIAGNOSIS — M25562 Pain in left knee: Secondary | ICD-10-CM | POA: Diagnosis not present

## 2016-01-10 DIAGNOSIS — M542 Cervicalgia: Secondary | ICD-10-CM | POA: Diagnosis not present

## 2016-01-10 DIAGNOSIS — M545 Low back pain: Secondary | ICD-10-CM | POA: Diagnosis not present

## 2016-01-10 DIAGNOSIS — M25562 Pain in left knee: Secondary | ICD-10-CM | POA: Diagnosis not present

## 2016-01-10 DIAGNOSIS — M25561 Pain in right knee: Secondary | ICD-10-CM | POA: Diagnosis not present

## 2016-01-17 DIAGNOSIS — M542 Cervicalgia: Secondary | ICD-10-CM | POA: Diagnosis not present

## 2016-01-17 DIAGNOSIS — M25561 Pain in right knee: Secondary | ICD-10-CM | POA: Diagnosis not present

## 2016-01-17 DIAGNOSIS — M25562 Pain in left knee: Secondary | ICD-10-CM | POA: Diagnosis not present

## 2016-01-17 DIAGNOSIS — M545 Low back pain: Secondary | ICD-10-CM | POA: Diagnosis not present

## 2016-01-31 DIAGNOSIS — M25561 Pain in right knee: Secondary | ICD-10-CM | POA: Diagnosis not present

## 2016-01-31 DIAGNOSIS — M542 Cervicalgia: Secondary | ICD-10-CM | POA: Diagnosis not present

## 2016-01-31 DIAGNOSIS — M545 Low back pain: Secondary | ICD-10-CM | POA: Diagnosis not present

## 2016-01-31 DIAGNOSIS — M25562 Pain in left knee: Secondary | ICD-10-CM | POA: Diagnosis not present

## 2016-02-08 DIAGNOSIS — M25561 Pain in right knee: Secondary | ICD-10-CM | POA: Diagnosis not present

## 2016-02-08 DIAGNOSIS — M545 Low back pain: Secondary | ICD-10-CM | POA: Diagnosis not present

## 2016-02-08 DIAGNOSIS — M25562 Pain in left knee: Secondary | ICD-10-CM | POA: Diagnosis not present

## 2016-02-08 DIAGNOSIS — M542 Cervicalgia: Secondary | ICD-10-CM | POA: Diagnosis not present

## 2016-02-14 DIAGNOSIS — M545 Low back pain: Secondary | ICD-10-CM | POA: Diagnosis not present

## 2016-02-14 DIAGNOSIS — M25561 Pain in right knee: Secondary | ICD-10-CM | POA: Diagnosis not present

## 2016-02-14 DIAGNOSIS — M25562 Pain in left knee: Secondary | ICD-10-CM | POA: Diagnosis not present

## 2016-02-14 DIAGNOSIS — M542 Cervicalgia: Secondary | ICD-10-CM | POA: Diagnosis not present

## 2016-02-15 DIAGNOSIS — M25561 Pain in right knee: Secondary | ICD-10-CM | POA: Diagnosis not present

## 2016-02-15 DIAGNOSIS — M545 Low back pain: Secondary | ICD-10-CM | POA: Diagnosis not present

## 2016-02-15 DIAGNOSIS — M542 Cervicalgia: Secondary | ICD-10-CM | POA: Diagnosis not present

## 2016-02-15 DIAGNOSIS — M25562 Pain in left knee: Secondary | ICD-10-CM | POA: Diagnosis not present

## 2016-02-19 DIAGNOSIS — M25562 Pain in left knee: Secondary | ICD-10-CM | POA: Diagnosis not present

## 2016-02-19 DIAGNOSIS — M25561 Pain in right knee: Secondary | ICD-10-CM | POA: Diagnosis not present

## 2016-02-19 DIAGNOSIS — M542 Cervicalgia: Secondary | ICD-10-CM | POA: Diagnosis not present

## 2016-02-19 DIAGNOSIS — M545 Low back pain: Secondary | ICD-10-CM | POA: Diagnosis not present

## 2016-02-28 DIAGNOSIS — M25561 Pain in right knee: Secondary | ICD-10-CM | POA: Diagnosis not present

## 2016-02-28 DIAGNOSIS — M25562 Pain in left knee: Secondary | ICD-10-CM | POA: Diagnosis not present

## 2016-02-28 DIAGNOSIS — M545 Low back pain: Secondary | ICD-10-CM | POA: Diagnosis not present

## 2016-02-28 DIAGNOSIS — M542 Cervicalgia: Secondary | ICD-10-CM | POA: Diagnosis not present

## 2016-02-29 DIAGNOSIS — M25562 Pain in left knee: Secondary | ICD-10-CM | POA: Diagnosis not present

## 2016-02-29 DIAGNOSIS — M542 Cervicalgia: Secondary | ICD-10-CM | POA: Diagnosis not present

## 2016-02-29 DIAGNOSIS — M545 Low back pain: Secondary | ICD-10-CM | POA: Diagnosis not present

## 2016-02-29 DIAGNOSIS — M25561 Pain in right knee: Secondary | ICD-10-CM | POA: Diagnosis not present

## 2016-03-10 DIAGNOSIS — I10 Essential (primary) hypertension: Secondary | ICD-10-CM | POA: Diagnosis not present

## 2016-03-10 DIAGNOSIS — F419 Anxiety disorder, unspecified: Secondary | ICD-10-CM | POA: Diagnosis not present

## 2016-03-10 DIAGNOSIS — L659 Nonscarring hair loss, unspecified: Secondary | ICD-10-CM | POA: Diagnosis not present

## 2016-03-10 DIAGNOSIS — Z23 Encounter for immunization: Secondary | ICD-10-CM | POA: Diagnosis not present

## 2016-03-10 DIAGNOSIS — Z0000 Encounter for general adult medical examination without abnormal findings: Secondary | ICD-10-CM | POA: Diagnosis not present

## 2016-03-10 DIAGNOSIS — K219 Gastro-esophageal reflux disease without esophagitis: Secondary | ICD-10-CM | POA: Diagnosis not present

## 2016-03-12 DIAGNOSIS — M545 Low back pain: Secondary | ICD-10-CM | POA: Diagnosis not present

## 2016-03-12 DIAGNOSIS — M25561 Pain in right knee: Secondary | ICD-10-CM | POA: Diagnosis not present

## 2016-03-12 DIAGNOSIS — M25562 Pain in left knee: Secondary | ICD-10-CM | POA: Diagnosis not present

## 2016-03-12 DIAGNOSIS — M542 Cervicalgia: Secondary | ICD-10-CM | POA: Diagnosis not present

## 2016-03-17 DIAGNOSIS — M542 Cervicalgia: Secondary | ICD-10-CM | POA: Diagnosis not present

## 2016-03-17 DIAGNOSIS — M25561 Pain in right knee: Secondary | ICD-10-CM | POA: Diagnosis not present

## 2016-03-17 DIAGNOSIS — M25562 Pain in left knee: Secondary | ICD-10-CM | POA: Diagnosis not present

## 2016-03-17 DIAGNOSIS — M545 Low back pain: Secondary | ICD-10-CM | POA: Diagnosis not present

## 2016-03-21 DIAGNOSIS — M25562 Pain in left knee: Secondary | ICD-10-CM | POA: Diagnosis not present

## 2016-03-21 DIAGNOSIS — M25561 Pain in right knee: Secondary | ICD-10-CM | POA: Diagnosis not present

## 2016-03-21 DIAGNOSIS — M542 Cervicalgia: Secondary | ICD-10-CM | POA: Diagnosis not present

## 2016-03-21 DIAGNOSIS — M545 Low back pain: Secondary | ICD-10-CM | POA: Diagnosis not present

## 2016-03-27 DIAGNOSIS — M542 Cervicalgia: Secondary | ICD-10-CM | POA: Diagnosis not present

## 2016-03-27 DIAGNOSIS — M545 Low back pain: Secondary | ICD-10-CM | POA: Diagnosis not present

## 2016-03-27 DIAGNOSIS — M25561 Pain in right knee: Secondary | ICD-10-CM | POA: Diagnosis not present

## 2016-03-27 DIAGNOSIS — M25562 Pain in left knee: Secondary | ICD-10-CM | POA: Diagnosis not present

## 2016-04-07 DIAGNOSIS — F99 Mental disorder, not otherwise specified: Secondary | ICD-10-CM | POA: Diagnosis not present

## 2016-04-07 DIAGNOSIS — G8929 Other chronic pain: Secondary | ICD-10-CM | POA: Diagnosis not present

## 2016-04-07 DIAGNOSIS — F419 Anxiety disorder, unspecified: Secondary | ICD-10-CM | POA: Diagnosis not present

## 2016-04-07 DIAGNOSIS — F5105 Insomnia due to other mental disorder: Secondary | ICD-10-CM | POA: Diagnosis not present

## 2016-04-14 DIAGNOSIS — M545 Low back pain: Secondary | ICD-10-CM | POA: Diagnosis not present

## 2016-04-14 DIAGNOSIS — M542 Cervicalgia: Secondary | ICD-10-CM | POA: Diagnosis not present

## 2016-04-14 DIAGNOSIS — M25561 Pain in right knee: Secondary | ICD-10-CM | POA: Diagnosis not present

## 2016-04-14 DIAGNOSIS — M25562 Pain in left knee: Secondary | ICD-10-CM | POA: Diagnosis not present

## 2016-04-24 DIAGNOSIS — M25562 Pain in left knee: Secondary | ICD-10-CM | POA: Diagnosis not present

## 2016-04-24 DIAGNOSIS — M25561 Pain in right knee: Secondary | ICD-10-CM | POA: Diagnosis not present

## 2016-04-24 DIAGNOSIS — M545 Low back pain: Secondary | ICD-10-CM | POA: Diagnosis not present

## 2016-04-24 DIAGNOSIS — M542 Cervicalgia: Secondary | ICD-10-CM | POA: Diagnosis not present

## 2016-05-10 DIAGNOSIS — J0140 Acute pansinusitis, unspecified: Secondary | ICD-10-CM | POA: Diagnosis not present

## 2016-05-12 DIAGNOSIS — R51 Headache: Secondary | ICD-10-CM | POA: Diagnosis not present

## 2016-05-14 DIAGNOSIS — H40031 Anatomical narrow angle, right eye: Secondary | ICD-10-CM | POA: Diagnosis not present

## 2016-05-14 DIAGNOSIS — B029 Zoster without complications: Secondary | ICD-10-CM | POA: Diagnosis not present

## 2016-05-14 DIAGNOSIS — H1851 Endothelial corneal dystrophy: Secondary | ICD-10-CM | POA: Diagnosis not present

## 2016-05-22 DIAGNOSIS — B029 Zoster without complications: Secondary | ICD-10-CM | POA: Diagnosis not present

## 2016-06-04 DIAGNOSIS — L03211 Cellulitis of face: Secondary | ICD-10-CM | POA: Diagnosis not present

## 2016-06-26 DIAGNOSIS — M47816 Spondylosis without myelopathy or radiculopathy, lumbar region: Secondary | ICD-10-CM | POA: Diagnosis not present

## 2016-06-26 DIAGNOSIS — F419 Anxiety disorder, unspecified: Secondary | ICD-10-CM | POA: Diagnosis not present

## 2016-06-26 DIAGNOSIS — B0233 Zoster keratitis: Secondary | ICD-10-CM | POA: Diagnosis not present

## 2016-07-09 DIAGNOSIS — N390 Urinary tract infection, site not specified: Secondary | ICD-10-CM | POA: Diagnosis not present

## 2016-09-05 DIAGNOSIS — F411 Generalized anxiety disorder: Secondary | ICD-10-CM | POA: Diagnosis not present

## 2016-09-05 DIAGNOSIS — K219 Gastro-esophageal reflux disease without esophagitis: Secondary | ICD-10-CM | POA: Diagnosis not present

## 2016-09-05 DIAGNOSIS — I10 Essential (primary) hypertension: Secondary | ICD-10-CM | POA: Diagnosis not present

## 2016-09-05 DIAGNOSIS — M7062 Trochanteric bursitis, left hip: Secondary | ICD-10-CM | POA: Diagnosis not present

## 2016-09-09 DIAGNOSIS — M25552 Pain in left hip: Secondary | ICD-10-CM | POA: Diagnosis not present

## 2016-09-09 DIAGNOSIS — M25562 Pain in left knee: Secondary | ICD-10-CM | POA: Diagnosis not present

## 2016-10-07 DIAGNOSIS — Z01419 Encounter for gynecological examination (general) (routine) without abnormal findings: Secondary | ICD-10-CM | POA: Diagnosis not present

## 2016-10-07 DIAGNOSIS — Z6829 Body mass index (BMI) 29.0-29.9, adult: Secondary | ICD-10-CM | POA: Diagnosis not present

## 2016-10-07 DIAGNOSIS — Z1231 Encounter for screening mammogram for malignant neoplasm of breast: Secondary | ICD-10-CM | POA: Diagnosis not present

## 2016-10-13 DIAGNOSIS — R350 Frequency of micturition: Secondary | ICD-10-CM | POA: Diagnosis not present

## 2017-03-20 DIAGNOSIS — I10 Essential (primary) hypertension: Secondary | ICD-10-CM | POA: Diagnosis not present

## 2017-03-20 DIAGNOSIS — K219 Gastro-esophageal reflux disease without esophagitis: Secondary | ICD-10-CM | POA: Diagnosis not present

## 2017-03-20 DIAGNOSIS — E785 Hyperlipidemia, unspecified: Secondary | ICD-10-CM | POA: Diagnosis not present

## 2017-03-20 DIAGNOSIS — Z23 Encounter for immunization: Secondary | ICD-10-CM | POA: Diagnosis not present

## 2017-03-20 DIAGNOSIS — Z0000 Encounter for general adult medical examination without abnormal findings: Secondary | ICD-10-CM | POA: Diagnosis not present

## 2017-05-12 DIAGNOSIS — Z1211 Encounter for screening for malignant neoplasm of colon: Secondary | ICD-10-CM | POA: Diagnosis not present

## 2017-05-12 DIAGNOSIS — K6389 Other specified diseases of intestine: Secondary | ICD-10-CM | POA: Diagnosis not present

## 2017-09-18 DIAGNOSIS — K219 Gastro-esophageal reflux disease without esophagitis: Secondary | ICD-10-CM | POA: Diagnosis not present

## 2017-09-18 DIAGNOSIS — E781 Pure hyperglyceridemia: Secondary | ICD-10-CM | POA: Diagnosis not present

## 2017-09-18 DIAGNOSIS — M47816 Spondylosis without myelopathy or radiculopathy, lumbar region: Secondary | ICD-10-CM | POA: Diagnosis not present

## 2017-09-18 DIAGNOSIS — E782 Mixed hyperlipidemia: Secondary | ICD-10-CM | POA: Diagnosis not present

## 2017-09-18 DIAGNOSIS — I10 Essential (primary) hypertension: Secondary | ICD-10-CM | POA: Diagnosis not present

## 2017-11-02 DIAGNOSIS — Z01419 Encounter for gynecological examination (general) (routine) without abnormal findings: Secondary | ICD-10-CM | POA: Diagnosis not present

## 2017-11-02 DIAGNOSIS — Z6828 Body mass index (BMI) 28.0-28.9, adult: Secondary | ICD-10-CM | POA: Diagnosis not present

## 2017-11-02 DIAGNOSIS — Z1231 Encounter for screening mammogram for malignant neoplasm of breast: Secondary | ICD-10-CM | POA: Diagnosis not present

## 2017-12-25 DIAGNOSIS — E7800 Pure hypercholesterolemia, unspecified: Secondary | ICD-10-CM | POA: Diagnosis not present

## 2017-12-25 DIAGNOSIS — Z79899 Other long term (current) drug therapy: Secondary | ICD-10-CM | POA: Diagnosis not present

## 2018-03-26 DIAGNOSIS — F419 Anxiety disorder, unspecified: Secondary | ICD-10-CM | POA: Diagnosis not present

## 2018-03-26 DIAGNOSIS — E782 Mixed hyperlipidemia: Secondary | ICD-10-CM | POA: Diagnosis not present

## 2018-03-26 DIAGNOSIS — K219 Gastro-esophageal reflux disease without esophagitis: Secondary | ICD-10-CM | POA: Diagnosis not present

## 2018-03-26 DIAGNOSIS — Z0000 Encounter for general adult medical examination without abnormal findings: Secondary | ICD-10-CM | POA: Diagnosis not present

## 2018-03-26 DIAGNOSIS — I10 Essential (primary) hypertension: Secondary | ICD-10-CM | POA: Diagnosis not present

## 2018-08-13 ENCOUNTER — Other Ambulatory Visit: Payer: Self-pay

## 2018-08-13 DIAGNOSIS — R5383 Other fatigue: Secondary | ICD-10-CM | POA: Diagnosis not present

## 2018-08-13 DIAGNOSIS — R05 Cough: Secondary | ICD-10-CM | POA: Diagnosis not present

## 2018-08-13 DIAGNOSIS — Z03818 Encounter for observation for suspected exposure to other biological agents ruled out: Secondary | ICD-10-CM | POA: Diagnosis not present

## 2018-08-13 DIAGNOSIS — R509 Fever, unspecified: Secondary | ICD-10-CM | POA: Diagnosis not present

## 2018-08-13 DIAGNOSIS — R6889 Other general symptoms and signs: Secondary | ICD-10-CM

## 2018-08-22 LAB — NOVEL CORONAVIRUS, NAA: SARS-COV-2, NAA: NOT DETECTED

## 2018-09-27 DIAGNOSIS — N200 Calculus of kidney: Secondary | ICD-10-CM | POA: Diagnosis not present

## 2018-09-27 DIAGNOSIS — K219 Gastro-esophageal reflux disease without esophagitis: Secondary | ICD-10-CM | POA: Diagnosis not present

## 2018-09-27 DIAGNOSIS — F419 Anxiety disorder, unspecified: Secondary | ICD-10-CM | POA: Diagnosis not present

## 2018-09-27 DIAGNOSIS — I10 Essential (primary) hypertension: Secondary | ICD-10-CM | POA: Diagnosis not present

## 2018-10-11 DIAGNOSIS — M5442 Lumbago with sciatica, left side: Secondary | ICD-10-CM | POA: Diagnosis not present

## 2018-10-29 DIAGNOSIS — F4322 Adjustment disorder with anxiety: Secondary | ICD-10-CM | POA: Diagnosis not present

## 2018-11-03 DIAGNOSIS — F4322 Adjustment disorder with anxiety: Secondary | ICD-10-CM | POA: Diagnosis not present

## 2018-11-10 DIAGNOSIS — F4322 Adjustment disorder with anxiety: Secondary | ICD-10-CM | POA: Diagnosis not present

## 2018-11-15 DIAGNOSIS — Z13820 Encounter for screening for osteoporosis: Secondary | ICD-10-CM | POA: Diagnosis not present

## 2018-11-15 DIAGNOSIS — Z1231 Encounter for screening mammogram for malignant neoplasm of breast: Secondary | ICD-10-CM | POA: Diagnosis not present

## 2018-11-15 DIAGNOSIS — Z01419 Encounter for gynecological examination (general) (routine) without abnormal findings: Secondary | ICD-10-CM | POA: Diagnosis not present

## 2018-11-15 DIAGNOSIS — Z6828 Body mass index (BMI) 28.0-28.9, adult: Secondary | ICD-10-CM | POA: Diagnosis not present

## 2018-11-26 DIAGNOSIS — F4322 Adjustment disorder with anxiety: Secondary | ICD-10-CM | POA: Diagnosis not present

## 2018-12-08 DIAGNOSIS — F4322 Adjustment disorder with anxiety: Secondary | ICD-10-CM | POA: Diagnosis not present

## 2018-12-15 DIAGNOSIS — F4322 Adjustment disorder with anxiety: Secondary | ICD-10-CM | POA: Diagnosis not present

## 2018-12-20 DIAGNOSIS — J3081 Allergic rhinitis due to animal (cat) (dog) hair and dander: Secondary | ICD-10-CM | POA: Diagnosis not present

## 2018-12-20 DIAGNOSIS — T781XXD Other adverse food reactions, not elsewhere classified, subsequent encounter: Secondary | ICD-10-CM | POA: Diagnosis not present

## 2018-12-20 DIAGNOSIS — J3089 Other allergic rhinitis: Secondary | ICD-10-CM | POA: Diagnosis not present

## 2018-12-20 DIAGNOSIS — J309 Allergic rhinitis, unspecified: Secondary | ICD-10-CM | POA: Diagnosis not present

## 2018-12-22 DIAGNOSIS — T781XXA Other adverse food reactions, not elsewhere classified, initial encounter: Secondary | ICD-10-CM | POA: Diagnosis not present

## 2019-01-12 DIAGNOSIS — F4322 Adjustment disorder with anxiety: Secondary | ICD-10-CM | POA: Diagnosis not present

## 2019-02-02 DIAGNOSIS — F4322 Adjustment disorder with anxiety: Secondary | ICD-10-CM | POA: Diagnosis not present

## 2019-03-01 DIAGNOSIS — F4322 Adjustment disorder with anxiety: Secondary | ICD-10-CM | POA: Diagnosis not present

## 2019-03-19 DIAGNOSIS — Z23 Encounter for immunization: Secondary | ICD-10-CM | POA: Diagnosis not present

## 2019-03-22 DIAGNOSIS — F4322 Adjustment disorder with anxiety: Secondary | ICD-10-CM | POA: Diagnosis not present

## 2019-04-12 DIAGNOSIS — M549 Dorsalgia, unspecified: Secondary | ICD-10-CM | POA: Diagnosis not present

## 2019-04-12 DIAGNOSIS — E782 Mixed hyperlipidemia: Secondary | ICD-10-CM | POA: Diagnosis not present

## 2019-04-12 DIAGNOSIS — Z0000 Encounter for general adult medical examination without abnormal findings: Secondary | ICD-10-CM | POA: Diagnosis not present

## 2019-04-12 DIAGNOSIS — Z1159 Encounter for screening for other viral diseases: Secondary | ICD-10-CM | POA: Diagnosis not present

## 2019-04-12 DIAGNOSIS — I10 Essential (primary) hypertension: Secondary | ICD-10-CM | POA: Diagnosis not present

## 2019-04-12 DIAGNOSIS — Z5181 Encounter for therapeutic drug level monitoring: Secondary | ICD-10-CM | POA: Diagnosis not present

## 2019-04-12 DIAGNOSIS — K219 Gastro-esophageal reflux disease without esophagitis: Secondary | ICD-10-CM | POA: Diagnosis not present

## 2019-04-12 DIAGNOSIS — Z23 Encounter for immunization: Secondary | ICD-10-CM | POA: Diagnosis not present

## 2019-04-13 DIAGNOSIS — F4322 Adjustment disorder with anxiety: Secondary | ICD-10-CM | POA: Diagnosis not present

## 2019-04-15 DIAGNOSIS — Z20828 Contact with and (suspected) exposure to other viral communicable diseases: Secondary | ICD-10-CM | POA: Diagnosis not present

## 2019-04-25 DIAGNOSIS — F4322 Adjustment disorder with anxiety: Secondary | ICD-10-CM | POA: Diagnosis not present

## 2019-05-12 DIAGNOSIS — Z20828 Contact with and (suspected) exposure to other viral communicable diseases: Secondary | ICD-10-CM | POA: Diagnosis not present

## 2019-05-13 DIAGNOSIS — F4322 Adjustment disorder with anxiety: Secondary | ICD-10-CM | POA: Diagnosis not present

## 2019-06-15 ENCOUNTER — Ambulatory Visit: Payer: Self-pay | Attending: Internal Medicine

## 2019-06-15 DIAGNOSIS — Z23 Encounter for immunization: Secondary | ICD-10-CM | POA: Insufficient documentation

## 2019-06-15 NOTE — Progress Notes (Signed)
   Covid-19 Vaccination Clinic  Name:  Chloe Johnson    MRN: [id number] DOB: Aug 21, 1955  06/15/2019  Chloe Johnson was observed post Covid-19 immunization for 15 minutes without incidence. She was provided with Vaccine Information Sheet and instruction to access the V-Safe system.   Chloe Johnson was instructed to call 911 with any severe reactions post vaccine: Marland Kitchen Difficulty breathing  . Swelling of your face and throat  . A fast heartbeat  . A bad rash all over your body  . Dizziness and weakness    Immunizations Administered    Name Date Dose VIS Date Route   Pfizer COVID-19 Vaccine 06/15/2019  6:57 PM 0.3 mL 05/06/2019 Intramuscular   Manufacturer: [employer], [employer]   Lot: [id number]   NDC: [address]-2

## 2019-06-20 DIAGNOSIS — F4322 Adjustment disorder with anxiety: Secondary | ICD-10-CM | POA: Diagnosis not present

## 2019-07-03 ENCOUNTER — Ambulatory Visit: Payer: Self-pay | Attending: Internal Medicine

## 2019-07-03 DIAGNOSIS — Z23 Encounter for immunization: Secondary | ICD-10-CM | POA: Insufficient documentation

## 2019-07-03 NOTE — Progress Notes (Signed)
   Covid-19 Vaccination Clinic  Name:  Chloe Johnson    MRN: [id number] DOB: 11-Apr-1956  07/03/2019  [name] was observed post Covid-19 immunization for 15 minutes without incidence. She was provided with Vaccine Information Sheet and instruction to access the V-Safe system.   [name] was instructed to call 911 with any severe reactions post vaccine: Marland Kitchen Difficulty breathing  . Swelling of your face and throat  . A fast heartbeat  . A bad rash all over your body  . Dizziness and weakness    Immunizations Administered    Name Date Dose VIS Date Route   Pfizer COVID-19 Vaccine 07/03/2019 10:57 AM 0.3 mL 05/06/2019 Intramuscular   Manufacturer: [employer], [employer]   Lot: [id number]   NDC: [address]-8

## 2019-07-13 DIAGNOSIS — F4322 Adjustment disorder with anxiety: Secondary | ICD-10-CM | POA: Diagnosis not present

## 2019-07-27 DIAGNOSIS — F4322 Adjustment disorder with anxiety: Secondary | ICD-10-CM | POA: Diagnosis not present

## 2019-08-18 DIAGNOSIS — F4322 Adjustment disorder with anxiety: Secondary | ICD-10-CM | POA: Diagnosis not present

## 2019-09-05 DIAGNOSIS — F4322 Adjustment disorder with anxiety: Secondary | ICD-10-CM | POA: Diagnosis not present

## 2019-10-13 DIAGNOSIS — Z23 Encounter for immunization: Secondary | ICD-10-CM | POA: Diagnosis not present

## 2019-10-13 DIAGNOSIS — M47816 Spondylosis without myelopathy or radiculopathy, lumbar region: Secondary | ICD-10-CM | POA: Diagnosis not present

## 2019-10-13 DIAGNOSIS — E782 Mixed hyperlipidemia: Secondary | ICD-10-CM | POA: Diagnosis not present

## 2019-10-13 DIAGNOSIS — K219 Gastro-esophageal reflux disease without esophagitis: Secondary | ICD-10-CM | POA: Diagnosis not present

## 2019-10-13 DIAGNOSIS — I10 Essential (primary) hypertension: Secondary | ICD-10-CM | POA: Diagnosis not present

## 2019-10-26 DIAGNOSIS — F4322 Adjustment disorder with anxiety: Secondary | ICD-10-CM | POA: Diagnosis not present

## 2019-11-08 DIAGNOSIS — F4322 Adjustment disorder with anxiety: Secondary | ICD-10-CM | POA: Diagnosis not present

## 2019-12-10 DIAGNOSIS — Z20822 Contact with and (suspected) exposure to covid-19: Secondary | ICD-10-CM | POA: Diagnosis not present

## 2019-12-16 DIAGNOSIS — Z20822 Contact with and (suspected) exposure to covid-19: Secondary | ICD-10-CM | POA: Diagnosis not present

## 2019-12-28 DIAGNOSIS — Z1231 Encounter for screening mammogram for malignant neoplasm of breast: Secondary | ICD-10-CM | POA: Diagnosis not present

## 2019-12-28 DIAGNOSIS — Z01419 Encounter for gynecological examination (general) (routine) without abnormal findings: Secondary | ICD-10-CM | POA: Diagnosis not present

## 2020-01-12 DIAGNOSIS — F4322 Adjustment disorder with anxiety: Secondary | ICD-10-CM | POA: Diagnosis not present

## 2020-01-28 DIAGNOSIS — F4322 Adjustment disorder with anxiety: Secondary | ICD-10-CM | POA: Diagnosis not present

## 2020-02-27 DIAGNOSIS — Z20822 Contact with and (suspected) exposure to covid-19: Secondary | ICD-10-CM | POA: Diagnosis not present

## 2020-04-17 DIAGNOSIS — Z20822 Contact with and (suspected) exposure to covid-19: Secondary | ICD-10-CM | POA: Diagnosis not present

## 2020-06-06 DIAGNOSIS — I10 Essential (primary) hypertension: Secondary | ICD-10-CM | POA: Diagnosis not present

## 2020-06-06 DIAGNOSIS — M47816 Spondylosis without myelopathy or radiculopathy, lumbar region: Secondary | ICD-10-CM | POA: Diagnosis not present

## 2020-06-06 DIAGNOSIS — K219 Gastro-esophageal reflux disease without esophagitis: Secondary | ICD-10-CM | POA: Diagnosis not present

## 2020-06-06 DIAGNOSIS — R1030 Lower abdominal pain, unspecified: Secondary | ICD-10-CM | POA: Diagnosis not present

## 2020-06-06 DIAGNOSIS — E782 Mixed hyperlipidemia: Secondary | ICD-10-CM | POA: Diagnosis not present

## 2020-06-06 DIAGNOSIS — Z0000 Encounter for general adult medical examination without abnormal findings: Secondary | ICD-10-CM | POA: Diagnosis not present

## 2020-06-14 ENCOUNTER — Other Ambulatory Visit: Payer: Self-pay | Admitting: Physician Assistant

## 2020-06-14 DIAGNOSIS — R634 Abnormal weight loss: Secondary | ICD-10-CM | POA: Diagnosis not present

## 2020-06-14 DIAGNOSIS — R630 Anorexia: Secondary | ICD-10-CM

## 2020-06-14 DIAGNOSIS — R102 Pelvic and perineal pain: Secondary | ICD-10-CM | POA: Diagnosis not present

## 2020-06-14 DIAGNOSIS — R1030 Lower abdominal pain, unspecified: Secondary | ICD-10-CM

## 2020-06-14 DIAGNOSIS — R110 Nausea: Secondary | ICD-10-CM

## 2020-06-18 ENCOUNTER — Ambulatory Visit
Admission: RE | Admit: 2020-06-18 | Discharge: 2020-06-18 | Disposition: A | Discharge status: Home / Self Care | Payer: Federal, State, Local not specified - PPO | Source: Ambulatory Visit | Attending: Physician Assistant | Admitting: Physician Assistant

## 2020-06-18 DIAGNOSIS — R630 Anorexia: Secondary | ICD-10-CM

## 2020-06-18 DIAGNOSIS — R110 Nausea: Secondary | ICD-10-CM | POA: Diagnosis not present

## 2020-06-18 DIAGNOSIS — R1030 Lower abdominal pain, unspecified: Secondary | ICD-10-CM

## 2020-06-18 DIAGNOSIS — R634 Abnormal weight loss: Secondary | ICD-10-CM

## 2020-06-18 DIAGNOSIS — M47816 Spondylosis without myelopathy or radiculopathy, lumbar region: Secondary | ICD-10-CM | POA: Diagnosis not present

## 2020-06-18 DIAGNOSIS — I708 Atherosclerosis of other arteries: Secondary | ICD-10-CM | POA: Diagnosis not present

## 2020-06-18 MED ORDER — IOPAMIDOL (ISOVUE-300) INJECTION 61%
100.0000 mL | Freq: Once | INTRAVENOUS | Status: AC | PRN
  Administered 2020-06-18: 100 mL via INTRAVENOUS

## 2020-07-02 DIAGNOSIS — R1032 Left lower quadrant pain: Secondary | ICD-10-CM | POA: Diagnosis not present

## 2020-07-31 DIAGNOSIS — Z20822 Contact with and (suspected) exposure to covid-19: Secondary | ICD-10-CM | POA: Diagnosis not present

## 2020-08-05 DIAGNOSIS — J40 Bronchitis, not specified as acute or chronic: Secondary | ICD-10-CM | POA: Diagnosis not present

## 2020-08-05 DIAGNOSIS — J329 Chronic sinusitis, unspecified: Secondary | ICD-10-CM | POA: Diagnosis not present

## 2020-09-12 ENCOUNTER — Other Ambulatory Visit: Payer: Self-pay | Admitting: Internal Medicine

## 2020-09-12 DIAGNOSIS — E7800 Pure hypercholesterolemia, unspecified: Secondary | ICD-10-CM

## 2020-09-24 ENCOUNTER — Other Ambulatory Visit: Payer: Federal, State, Local not specified - PPO

## 2020-10-03 DIAGNOSIS — F4322 Adjustment disorder with anxiety: Secondary | ICD-10-CM | POA: Diagnosis not present

## 2020-10-04 ENCOUNTER — Ambulatory Visit
Admission: RE | Admit: 2020-10-04 | Discharge: 2020-10-04 | Disposition: A | Discharge status: Home / Self Care | Payer: No Typology Code available for payment source | Source: Ambulatory Visit | Attending: Internal Medicine | Admitting: Internal Medicine

## 2020-10-04 DIAGNOSIS — E7800 Pure hypercholesterolemia, unspecified: Secondary | ICD-10-CM

## 2020-10-04 DIAGNOSIS — E785 Hyperlipidemia, unspecified: Secondary | ICD-10-CM | POA: Diagnosis not present

## 2020-10-04 DIAGNOSIS — Z136 Encounter for screening for cardiovascular disorders: Secondary | ICD-10-CM | POA: Diagnosis not present

## 2020-10-27 DIAGNOSIS — M545 Low back pain, unspecified: Secondary | ICD-10-CM | POA: Diagnosis not present

## 2020-10-27 DIAGNOSIS — N3001 Acute cystitis with hematuria: Secondary | ICD-10-CM | POA: Diagnosis not present

## 2020-10-31 DIAGNOSIS — F4322 Adjustment disorder with anxiety: Secondary | ICD-10-CM | POA: Diagnosis not present

## 2020-11-01 DIAGNOSIS — Z20822 Contact with and (suspected) exposure to covid-19: Secondary | ICD-10-CM | POA: Diagnosis not present

## 2020-11-04 DIAGNOSIS — Z20822 Contact with and (suspected) exposure to covid-19: Secondary | ICD-10-CM | POA: Diagnosis not present

## 2020-11-12 DIAGNOSIS — H903 Sensorineural hearing loss, bilateral: Secondary | ICD-10-CM | POA: Diagnosis not present

## 2020-11-23 DIAGNOSIS — F4322 Adjustment disorder with anxiety: Secondary | ICD-10-CM | POA: Diagnosis not present

## 2020-12-07 DIAGNOSIS — M5136 Other intervertebral disc degeneration, lumbar region: Secondary | ICD-10-CM | POA: Diagnosis not present

## 2020-12-07 DIAGNOSIS — I10 Essential (primary) hypertension: Secondary | ICD-10-CM | POA: Diagnosis not present

## 2020-12-07 DIAGNOSIS — K219 Gastro-esophageal reflux disease without esophagitis: Secondary | ICD-10-CM | POA: Diagnosis not present

## 2020-12-17 DIAGNOSIS — F4322 Adjustment disorder with anxiety: Secondary | ICD-10-CM | POA: Diagnosis not present

## 2021-01-07 DIAGNOSIS — F4322 Adjustment disorder with anxiety: Secondary | ICD-10-CM | POA: Diagnosis not present

## 2021-01-14 DIAGNOSIS — Z01419 Encounter for gynecological examination (general) (routine) without abnormal findings: Secondary | ICD-10-CM | POA: Diagnosis not present

## 2021-01-14 DIAGNOSIS — Z13820 Encounter for screening for osteoporosis: Secondary | ICD-10-CM | POA: Diagnosis not present

## 2021-01-14 DIAGNOSIS — Z1231 Encounter for screening mammogram for malignant neoplasm of breast: Secondary | ICD-10-CM | POA: Diagnosis not present

## 2021-02-05 DIAGNOSIS — F4322 Adjustment disorder with anxiety: Secondary | ICD-10-CM | POA: Diagnosis not present

## 2021-03-06 DIAGNOSIS — F4322 Adjustment disorder with anxiety: Secondary | ICD-10-CM | POA: Diagnosis not present

## 2021-04-01 DIAGNOSIS — F4322 Adjustment disorder with anxiety: Secondary | ICD-10-CM | POA: Diagnosis not present

## 2021-04-23 DIAGNOSIS — M21372 Foot drop, left foot: Secondary | ICD-10-CM | POA: Diagnosis not present

## 2021-04-23 DIAGNOSIS — I10 Essential (primary) hypertension: Secondary | ICD-10-CM | POA: Diagnosis not present

## 2021-04-29 DIAGNOSIS — F4322 Adjustment disorder with anxiety: Secondary | ICD-10-CM | POA: Diagnosis not present

## 2021-05-07 DIAGNOSIS — I10 Essential (primary) hypertension: Secondary | ICD-10-CM | POA: Diagnosis not present

## 2021-05-07 DIAGNOSIS — M48062 Spinal stenosis, lumbar region with neurogenic claudication: Secondary | ICD-10-CM | POA: Diagnosis not present

## 2021-05-07 DIAGNOSIS — M21372 Foot drop, left foot: Secondary | ICD-10-CM | POA: Diagnosis not present

## 2021-05-22 DIAGNOSIS — M48062 Spinal stenosis, lumbar region with neurogenic claudication: Secondary | ICD-10-CM | POA: Diagnosis not present

## 2021-05-24 DIAGNOSIS — M48062 Spinal stenosis, lumbar region with neurogenic claudication: Secondary | ICD-10-CM | POA: Diagnosis not present

## 2021-05-24 DIAGNOSIS — M6281 Muscle weakness (generalized): Secondary | ICD-10-CM | POA: Diagnosis not present

## 2021-05-24 DIAGNOSIS — M545 Low back pain, unspecified: Secondary | ICD-10-CM | POA: Diagnosis not present

## 2021-05-28 DIAGNOSIS — M545 Low back pain, unspecified: Secondary | ICD-10-CM | POA: Diagnosis not present

## 2021-05-28 DIAGNOSIS — M48062 Spinal stenosis, lumbar region with neurogenic claudication: Secondary | ICD-10-CM | POA: Diagnosis not present

## 2021-05-28 DIAGNOSIS — M6281 Muscle weakness (generalized): Secondary | ICD-10-CM | POA: Diagnosis not present

## 2021-05-31 DIAGNOSIS — M545 Low back pain, unspecified: Secondary | ICD-10-CM | POA: Diagnosis not present

## 2021-05-31 DIAGNOSIS — M6281 Muscle weakness (generalized): Secondary | ICD-10-CM | POA: Diagnosis not present

## 2021-05-31 DIAGNOSIS — M48062 Spinal stenosis, lumbar region with neurogenic claudication: Secondary | ICD-10-CM | POA: Diagnosis not present

## 2021-06-03 DIAGNOSIS — F4322 Adjustment disorder with anxiety: Secondary | ICD-10-CM | POA: Diagnosis not present

## 2021-06-03 DIAGNOSIS — M48062 Spinal stenosis, lumbar region with neurogenic claudication: Secondary | ICD-10-CM | POA: Diagnosis not present

## 2021-06-03 DIAGNOSIS — M21372 Foot drop, left foot: Secondary | ICD-10-CM | POA: Diagnosis not present

## 2021-06-07 DIAGNOSIS — E782 Mixed hyperlipidemia: Secondary | ICD-10-CM | POA: Diagnosis not present

## 2021-06-07 DIAGNOSIS — M48062 Spinal stenosis, lumbar region with neurogenic claudication: Secondary | ICD-10-CM | POA: Diagnosis not present

## 2021-06-07 DIAGNOSIS — Z0000 Encounter for general adult medical examination without abnormal findings: Secondary | ICD-10-CM | POA: Diagnosis not present

## 2021-06-07 DIAGNOSIS — K219 Gastro-esophageal reflux disease without esophagitis: Secondary | ICD-10-CM | POA: Diagnosis not present

## 2021-06-07 DIAGNOSIS — M545 Low back pain, unspecified: Secondary | ICD-10-CM | POA: Diagnosis not present

## 2021-06-07 DIAGNOSIS — R739 Hyperglycemia, unspecified: Secondary | ICD-10-CM | POA: Diagnosis not present

## 2021-06-07 DIAGNOSIS — I10 Essential (primary) hypertension: Secondary | ICD-10-CM | POA: Diagnosis not present

## 2021-06-07 DIAGNOSIS — F419 Anxiety disorder, unspecified: Secondary | ICD-10-CM | POA: Diagnosis not present

## 2021-06-07 DIAGNOSIS — M47816 Spondylosis without myelopathy or radiculopathy, lumbar region: Secondary | ICD-10-CM | POA: Diagnosis not present

## 2021-06-07 DIAGNOSIS — M6281 Muscle weakness (generalized): Secondary | ICD-10-CM | POA: Diagnosis not present

## 2021-06-14 DIAGNOSIS — M545 Low back pain, unspecified: Secondary | ICD-10-CM | POA: Diagnosis not present

## 2021-06-14 DIAGNOSIS — M48062 Spinal stenosis, lumbar region with neurogenic claudication: Secondary | ICD-10-CM | POA: Diagnosis not present

## 2021-06-14 DIAGNOSIS — M6281 Muscle weakness (generalized): Secondary | ICD-10-CM | POA: Diagnosis not present

## 2021-06-18 IMAGING — CT CT CARDIAC CORONARY ARTERY CALCIUM SCORE
3 series · 14 of 20 positions shown, 16 images · non-contrast
Comparison: None.

CLINICAL DATA: 65-year-old Caucasian female with history of
hyperlipidemia and family history of heart disease.

EXAM:
CT CARDIAC CORONARY ARTERY CALCIUM SCORE
TECHNIQUE: Non-contrast imaging through the heart was performed using
prospective ECG gating. Image post processing was performed on an
independent workstation, allowing for quantitative analysis of the
heart and coronary arteries. Note that this exam targets the heart
and the chest was not imaged in its entirety.

[Series 2: calcium scoring 2.00 qr36 bestdiast 69% hrt calciu · axial · 0.39mm/px · z∈[+1574,+1656]mm · 4 of 69 slices shown]
[im 14/69  vessel]
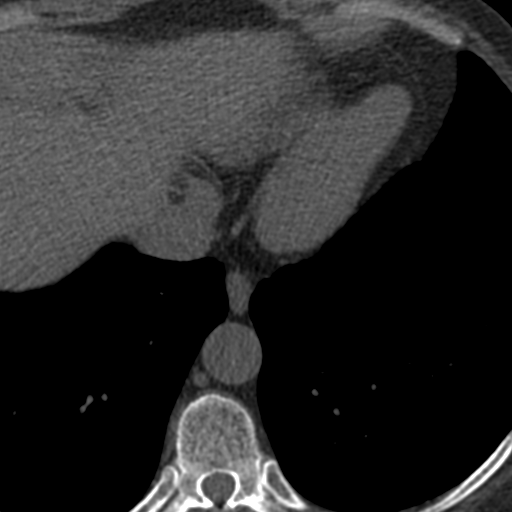
[im 28/69  vessel]
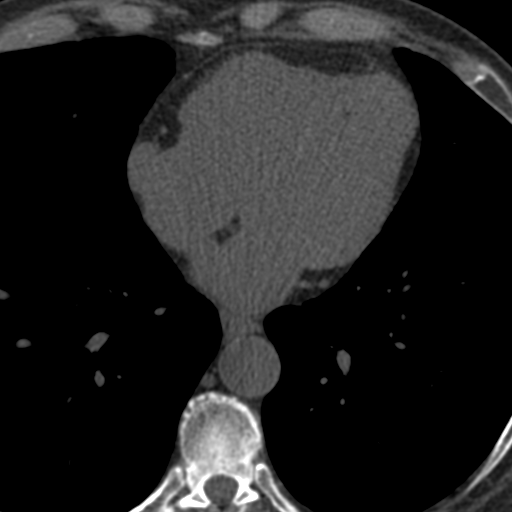
[im 41/69  vessel]
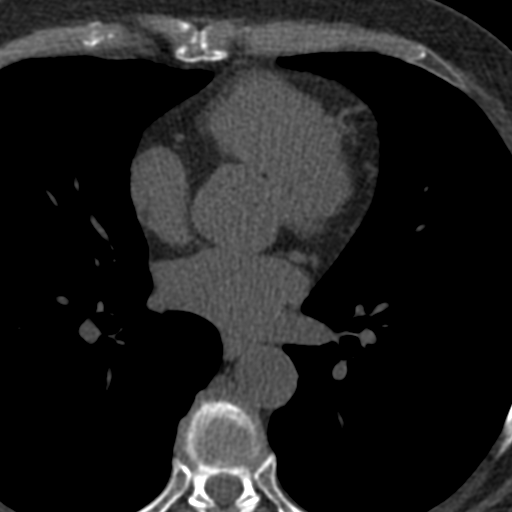
[im 55/69  vessel]
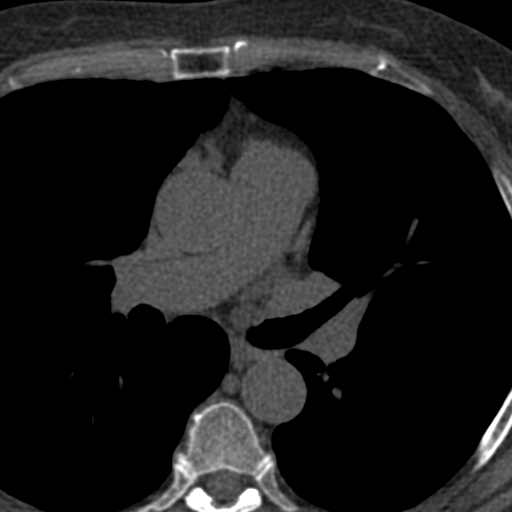

[Series 3: calcium scoring 2.00 br40 bestdiast 69% axial · axial · 0.56mm/px · z∈[+1569,+1661]mm · 5 of 70 slices shown, 7 images]
[im 12/70  vessel]
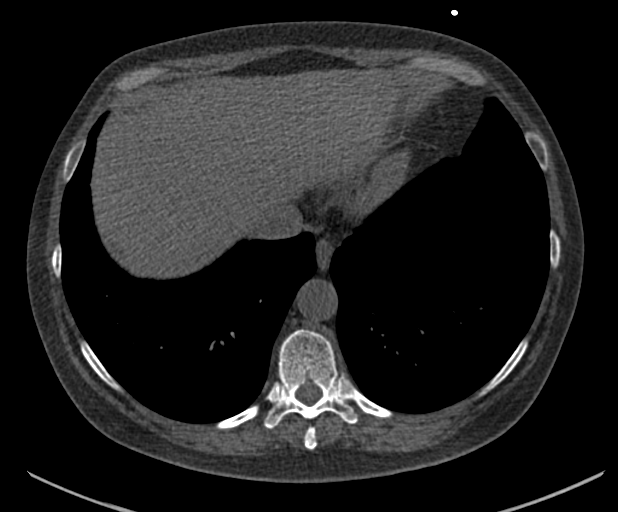
[im 12/70  lung]
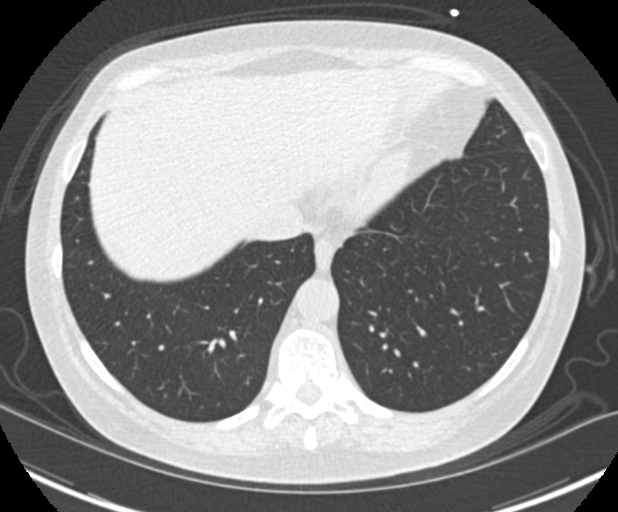
[im 24/70  vessel]
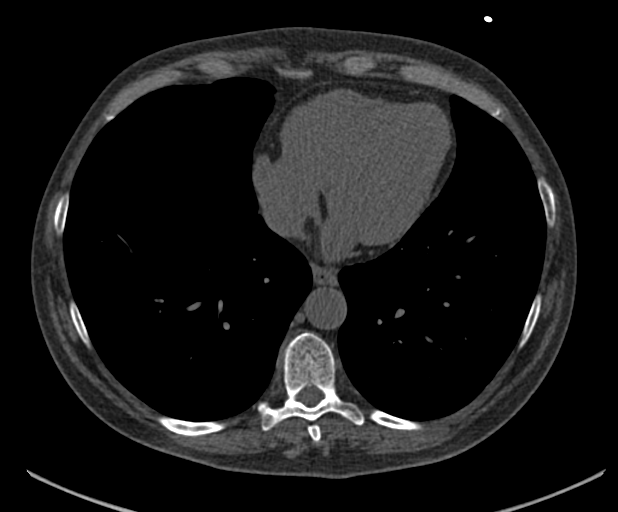
[im 35/70  vessel]
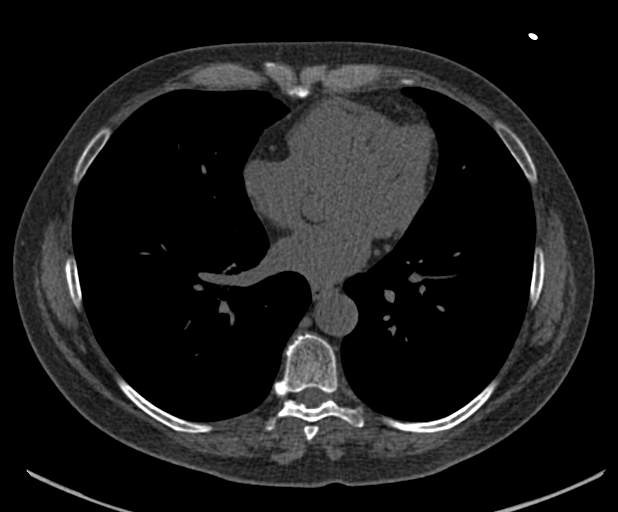
[im 47/70  vessel]
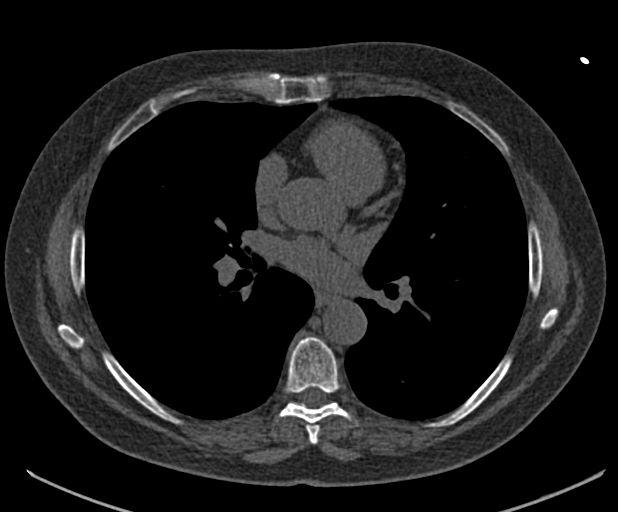
[im 58/70  vessel]
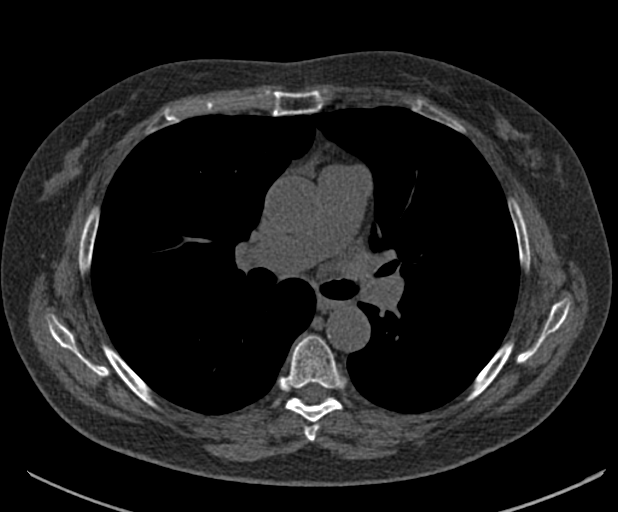
[im 58/70  lung]
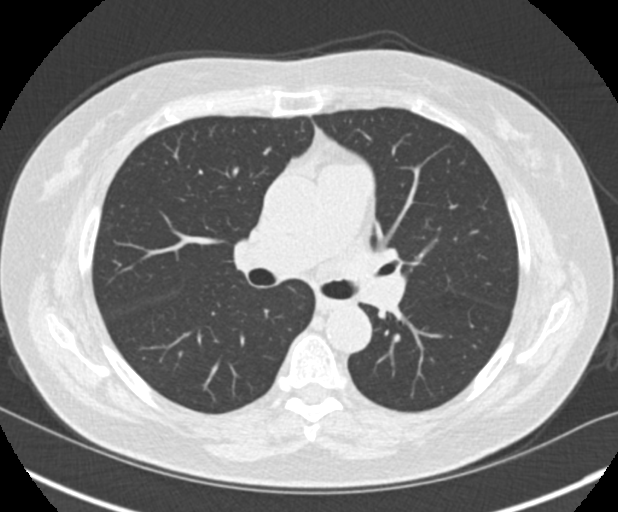

[Series 9: calcium scoring 2.00 br60 bestdiast 69% lungs · axial · 0.55mm/px · z∈[+1570,+1660]mm · 5 of 69 slices shown]
[im 12/69  vessel]
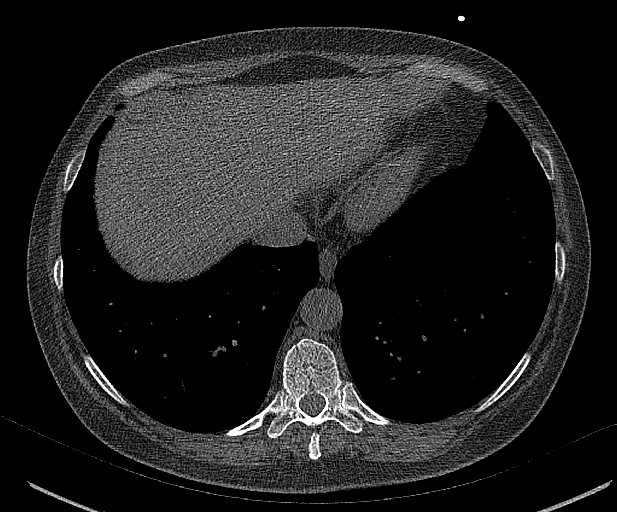
[im 23/69  vessel]
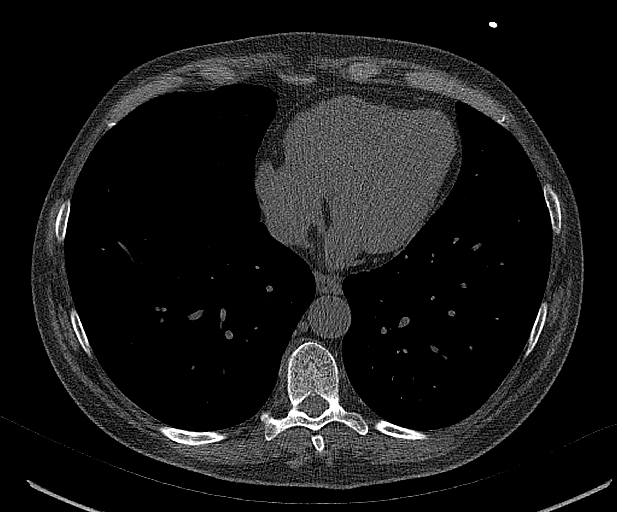
[im 35/69  vessel]
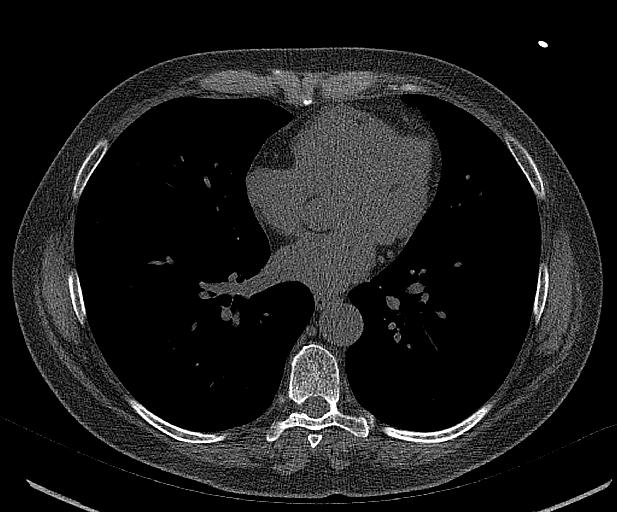
[im 46/69  vessel]
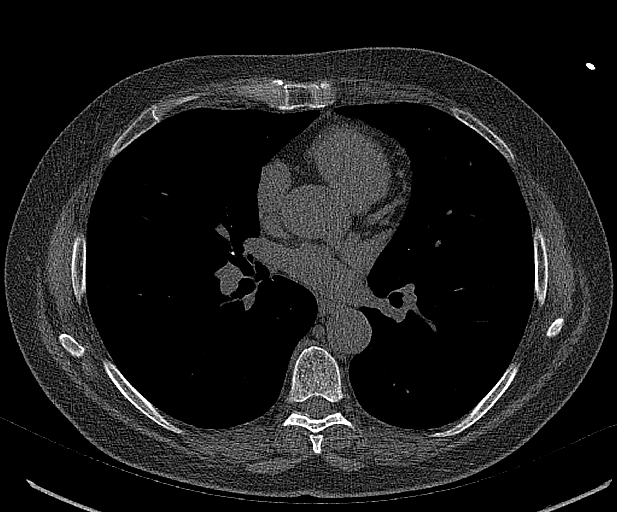
[im 57/69  vessel]
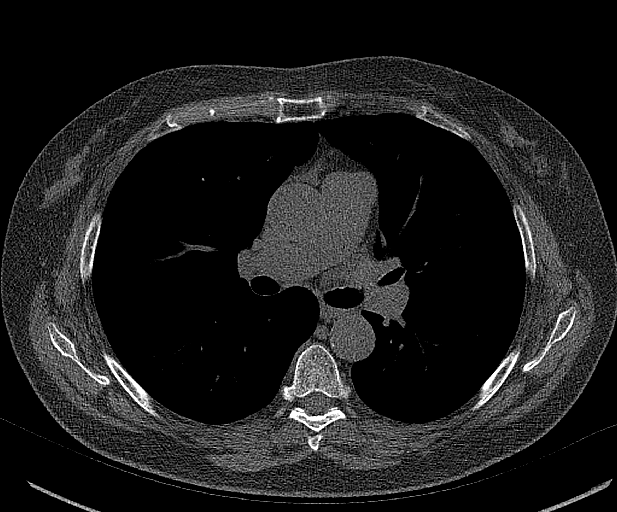

[14 of 20 positions shown; findings below may reference images not displayed]

FINDINGS: CORONARY CALCIUM SCORES:

Left Main: 0

LAD: 0

LCx: 0

RCA: 0

Total Agatston Score: 0

[HOSPITAL] percentile: 0

AORTA MEASUREMENTS:

Ascending Aorta: 31 mm

Descending Aorta:  mm

OTHER FINDINGS:

The heart size is within normal limits. No pericardial fluid
identified. Visualized segments of the thoracic aorta and central
pulmonary arteries are normal in caliber. Visualized mediastinum and
hilar regions demonstrate no lymphadenopathy or masses. Visualized
lungs show no evidence of pulmonary edema, consolidation,
pneumothorax, nodule or pleural fluid. Visualized upper abdomen and
bony structures are unremarkable.
IMPRESSION: Coronary calcium score of 0.

## 2021-06-24 DIAGNOSIS — M6281 Muscle weakness (generalized): Secondary | ICD-10-CM | POA: Diagnosis not present

## 2021-06-24 DIAGNOSIS — M545 Low back pain, unspecified: Secondary | ICD-10-CM | POA: Diagnosis not present

## 2021-06-24 DIAGNOSIS — M48062 Spinal stenosis, lumbar region with neurogenic claudication: Secondary | ICD-10-CM | POA: Diagnosis not present

## 2021-06-26 DIAGNOSIS — F4322 Adjustment disorder with anxiety: Secondary | ICD-10-CM | POA: Diagnosis not present

## 2021-07-08 DIAGNOSIS — M545 Low back pain, unspecified: Secondary | ICD-10-CM | POA: Diagnosis not present

## 2021-07-08 DIAGNOSIS — M48062 Spinal stenosis, lumbar region with neurogenic claudication: Secondary | ICD-10-CM | POA: Diagnosis not present

## 2021-07-08 DIAGNOSIS — M6281 Muscle weakness (generalized): Secondary | ICD-10-CM | POA: Diagnosis not present

## 2021-07-09 DIAGNOSIS — M48062 Spinal stenosis, lumbar region with neurogenic claudication: Secondary | ICD-10-CM | POA: Diagnosis not present

## 2021-07-16 DIAGNOSIS — M6281 Muscle weakness (generalized): Secondary | ICD-10-CM | POA: Diagnosis not present

## 2021-07-16 DIAGNOSIS — M545 Low back pain, unspecified: Secondary | ICD-10-CM | POA: Diagnosis not present

## 2021-07-16 DIAGNOSIS — M48062 Spinal stenosis, lumbar region with neurogenic claudication: Secondary | ICD-10-CM | POA: Diagnosis not present

## 2021-07-16 DIAGNOSIS — F4322 Adjustment disorder with anxiety: Secondary | ICD-10-CM | POA: Diagnosis not present

## 2021-07-22 DIAGNOSIS — M545 Low back pain, unspecified: Secondary | ICD-10-CM | POA: Diagnosis not present

## 2021-07-22 DIAGNOSIS — M6281 Muscle weakness (generalized): Secondary | ICD-10-CM | POA: Diagnosis not present

## 2021-07-22 DIAGNOSIS — M48062 Spinal stenosis, lumbar region with neurogenic claudication: Secondary | ICD-10-CM | POA: Diagnosis not present

## 2021-07-29 DIAGNOSIS — M48062 Spinal stenosis, lumbar region with neurogenic claudication: Secondary | ICD-10-CM | POA: Diagnosis not present

## 2021-07-29 DIAGNOSIS — M6281 Muscle weakness (generalized): Secondary | ICD-10-CM | POA: Diagnosis not present

## 2021-07-29 DIAGNOSIS — M545 Low back pain, unspecified: Secondary | ICD-10-CM | POA: Diagnosis not present

## 2021-08-05 DIAGNOSIS — M6281 Muscle weakness (generalized): Secondary | ICD-10-CM | POA: Diagnosis not present

## 2021-08-05 DIAGNOSIS — M545 Low back pain, unspecified: Secondary | ICD-10-CM | POA: Diagnosis not present

## 2021-08-05 DIAGNOSIS — M48062 Spinal stenosis, lumbar region with neurogenic claudication: Secondary | ICD-10-CM | POA: Diagnosis not present

## 2021-08-12 DIAGNOSIS — M48062 Spinal stenosis, lumbar region with neurogenic claudication: Secondary | ICD-10-CM | POA: Diagnosis not present

## 2021-08-12 DIAGNOSIS — M545 Low back pain, unspecified: Secondary | ICD-10-CM | POA: Diagnosis not present

## 2021-08-12 DIAGNOSIS — M6281 Muscle weakness (generalized): Secondary | ICD-10-CM | POA: Diagnosis not present

## 2021-08-19 DIAGNOSIS — M48062 Spinal stenosis, lumbar region with neurogenic claudication: Secondary | ICD-10-CM | POA: Diagnosis not present

## 2021-08-19 DIAGNOSIS — M6281 Muscle weakness (generalized): Secondary | ICD-10-CM | POA: Diagnosis not present

## 2021-08-19 DIAGNOSIS — M545 Low back pain, unspecified: Secondary | ICD-10-CM | POA: Diagnosis not present

## 2021-08-20 DIAGNOSIS — F4322 Adjustment disorder with anxiety: Secondary | ICD-10-CM | POA: Diagnosis not present

## 2021-09-02 DIAGNOSIS — M542 Cervicalgia: Secondary | ICD-10-CM | POA: Diagnosis not present

## 2021-09-02 DIAGNOSIS — M7912 Myalgia of auxiliary muscles, head and neck: Secondary | ICD-10-CM | POA: Diagnosis not present

## 2021-09-09 DIAGNOSIS — M6281 Muscle weakness (generalized): Secondary | ICD-10-CM | POA: Diagnosis not present

## 2021-09-09 DIAGNOSIS — M545 Low back pain, unspecified: Secondary | ICD-10-CM | POA: Diagnosis not present

## 2021-09-09 DIAGNOSIS — M48062 Spinal stenosis, lumbar region with neurogenic claudication: Secondary | ICD-10-CM | POA: Diagnosis not present

## 2021-09-10 DIAGNOSIS — F4322 Adjustment disorder with anxiety: Secondary | ICD-10-CM | POA: Diagnosis not present

## 2021-09-16 DIAGNOSIS — M542 Cervicalgia: Secondary | ICD-10-CM | POA: Diagnosis not present

## 2021-09-16 DIAGNOSIS — M7912 Myalgia of auxiliary muscles, head and neck: Secondary | ICD-10-CM | POA: Diagnosis not present

## 2021-09-23 DIAGNOSIS — M545 Low back pain, unspecified: Secondary | ICD-10-CM | POA: Diagnosis not present

## 2021-09-23 DIAGNOSIS — M48062 Spinal stenosis, lumbar region with neurogenic claudication: Secondary | ICD-10-CM | POA: Diagnosis not present

## 2021-09-23 DIAGNOSIS — M6281 Muscle weakness (generalized): Secondary | ICD-10-CM | POA: Diagnosis not present

## 2021-09-24 DIAGNOSIS — M7912 Myalgia of auxiliary muscles, head and neck: Secondary | ICD-10-CM | POA: Diagnosis not present

## 2021-09-24 DIAGNOSIS — M542 Cervicalgia: Secondary | ICD-10-CM | POA: Diagnosis not present

## 2021-09-30 DIAGNOSIS — M6281 Muscle weakness (generalized): Secondary | ICD-10-CM | POA: Diagnosis not present

## 2021-09-30 DIAGNOSIS — M48062 Spinal stenosis, lumbar region with neurogenic claudication: Secondary | ICD-10-CM | POA: Diagnosis not present

## 2021-09-30 DIAGNOSIS — M545 Low back pain, unspecified: Secondary | ICD-10-CM | POA: Diagnosis not present

## 2021-10-03 DIAGNOSIS — M48062 Spinal stenosis, lumbar region with neurogenic claudication: Secondary | ICD-10-CM | POA: Diagnosis not present

## 2021-10-03 DIAGNOSIS — M545 Low back pain, unspecified: Secondary | ICD-10-CM | POA: Diagnosis not present

## 2021-10-03 DIAGNOSIS — M6281 Muscle weakness (generalized): Secondary | ICD-10-CM | POA: Diagnosis not present

## 2021-10-07 DIAGNOSIS — M48062 Spinal stenosis, lumbar region with neurogenic claudication: Secondary | ICD-10-CM | POA: Diagnosis not present

## 2021-10-07 DIAGNOSIS — M545 Low back pain, unspecified: Secondary | ICD-10-CM | POA: Diagnosis not present

## 2021-10-07 DIAGNOSIS — M6281 Muscle weakness (generalized): Secondary | ICD-10-CM | POA: Diagnosis not present

## 2021-10-08 DIAGNOSIS — M542 Cervicalgia: Secondary | ICD-10-CM | POA: Diagnosis not present

## 2021-10-08 DIAGNOSIS — R21 Rash and other nonspecific skin eruption: Secondary | ICD-10-CM | POA: Diagnosis not present

## 2021-10-14 DIAGNOSIS — M6281 Muscle weakness (generalized): Secondary | ICD-10-CM | POA: Diagnosis not present

## 2021-10-14 DIAGNOSIS — M545 Low back pain, unspecified: Secondary | ICD-10-CM | POA: Diagnosis not present

## 2021-10-14 DIAGNOSIS — M48062 Spinal stenosis, lumbar region with neurogenic claudication: Secondary | ICD-10-CM | POA: Diagnosis not present

## 2021-10-16 DIAGNOSIS — R21 Rash and other nonspecific skin eruption: Secondary | ICD-10-CM | POA: Diagnosis not present

## 2021-10-22 DIAGNOSIS — M542 Cervicalgia: Secondary | ICD-10-CM | POA: Diagnosis not present

## 2021-10-22 DIAGNOSIS — M7912 Myalgia of auxiliary muscles, head and neck: Secondary | ICD-10-CM | POA: Diagnosis not present

## 2021-10-25 DIAGNOSIS — F4322 Adjustment disorder with anxiety: Secondary | ICD-10-CM | POA: Diagnosis not present

## 2021-10-26 DIAGNOSIS — R059 Cough, unspecified: Secondary | ICD-10-CM | POA: Diagnosis not present

## 2021-10-26 DIAGNOSIS — R6883 Chills (without fever): Secondary | ICD-10-CM | POA: Diagnosis not present

## 2021-10-26 DIAGNOSIS — J029 Acute pharyngitis, unspecified: Secondary | ICD-10-CM | POA: Diagnosis not present

## 2021-10-26 DIAGNOSIS — Z20818 Contact with and (suspected) exposure to other bacterial communicable diseases: Secondary | ICD-10-CM | POA: Diagnosis not present

## 2021-10-26 DIAGNOSIS — Z03818 Encounter for observation for suspected exposure to other biological agents ruled out: Secondary | ICD-10-CM | POA: Diagnosis not present

## 2021-11-11 DIAGNOSIS — M7912 Myalgia of auxiliary muscles, head and neck: Secondary | ICD-10-CM | POA: Diagnosis not present

## 2021-11-11 DIAGNOSIS — M542 Cervicalgia: Secondary | ICD-10-CM | POA: Diagnosis not present

## 2021-11-21 ENCOUNTER — Ambulatory Visit
Admission: RE | Admit: 2021-11-21 | Discharge: 2021-11-21 | Disposition: A | Discharge status: Home / Self Care | Payer: Federal, State, Local not specified - PPO | Source: Ambulatory Visit | Attending: Internal Medicine | Admitting: Internal Medicine

## 2021-11-21 ENCOUNTER — Other Ambulatory Visit: Payer: Self-pay | Admitting: Internal Medicine

## 2021-11-21 DIAGNOSIS — R059 Cough, unspecified: Secondary | ICD-10-CM

## 2021-11-21 DIAGNOSIS — M791 Myalgia, unspecified site: Secondary | ICD-10-CM | POA: Diagnosis not present

## 2021-11-21 DIAGNOSIS — R5383 Other fatigue: Secondary | ICD-10-CM | POA: Diagnosis not present

## 2021-11-21 DIAGNOSIS — M542 Cervicalgia: Secondary | ICD-10-CM | POA: Diagnosis not present

## 2021-11-21 DIAGNOSIS — M7912 Myalgia of auxiliary muscles, head and neck: Secondary | ICD-10-CM | POA: Diagnosis not present

## 2021-11-22 DIAGNOSIS — R5383 Other fatigue: Secondary | ICD-10-CM | POA: Diagnosis not present

## 2021-11-25 DIAGNOSIS — F4322 Adjustment disorder with anxiety: Secondary | ICD-10-CM | POA: Diagnosis not present

## 2021-12-05 DIAGNOSIS — M542 Cervicalgia: Secondary | ICD-10-CM | POA: Diagnosis not present

## 2021-12-05 DIAGNOSIS — M7912 Myalgia of auxiliary muscles, head and neck: Secondary | ICD-10-CM | POA: Diagnosis not present

## 2021-12-06 DIAGNOSIS — I10 Essential (primary) hypertension: Secondary | ICD-10-CM | POA: Diagnosis not present

## 2021-12-06 DIAGNOSIS — M2550 Pain in unspecified joint: Secondary | ICD-10-CM | POA: Diagnosis not present

## 2021-12-06 DIAGNOSIS — M79602 Pain in left arm: Secondary | ICD-10-CM | POA: Diagnosis not present

## 2021-12-09 ENCOUNTER — Other Ambulatory Visit: Payer: Self-pay | Admitting: Internal Medicine

## 2021-12-09 DIAGNOSIS — M79602 Pain in left arm: Secondary | ICD-10-CM

## 2021-12-16 ENCOUNTER — Ambulatory Visit
Admission: RE | Admit: 2021-12-16 | Discharge: 2021-12-16 | Disposition: A | Discharge status: Home / Self Care | Payer: Federal, State, Local not specified - PPO | Source: Ambulatory Visit | Attending: Internal Medicine | Admitting: Internal Medicine

## 2021-12-16 DIAGNOSIS — M542 Cervicalgia: Secondary | ICD-10-CM | POA: Diagnosis not present

## 2021-12-16 DIAGNOSIS — M4312 Spondylolisthesis, cervical region: Secondary | ICD-10-CM | POA: Diagnosis not present

## 2021-12-16 DIAGNOSIS — M79602 Pain in left arm: Secondary | ICD-10-CM

## 2021-12-16 DIAGNOSIS — M4802 Spinal stenosis, cervical region: Secondary | ICD-10-CM | POA: Diagnosis not present

## 2021-12-19 DIAGNOSIS — M542 Cervicalgia: Secondary | ICD-10-CM | POA: Diagnosis not present

## 2021-12-19 DIAGNOSIS — M7912 Myalgia of auxiliary muscles, head and neck: Secondary | ICD-10-CM | POA: Diagnosis not present

## 2021-12-31 DIAGNOSIS — N3941 Urge incontinence: Secondary | ICD-10-CM | POA: Diagnosis not present

## 2021-12-31 DIAGNOSIS — R102 Pelvic and perineal pain: Secondary | ICD-10-CM | POA: Diagnosis not present

## 2021-12-31 DIAGNOSIS — R350 Frequency of micturition: Secondary | ICD-10-CM | POA: Diagnosis not present

## 2022-01-13 DIAGNOSIS — M5412 Radiculopathy, cervical region: Secondary | ICD-10-CM | POA: Diagnosis not present

## 2022-01-15 DIAGNOSIS — F4322 Adjustment disorder with anxiety: Secondary | ICD-10-CM | POA: Diagnosis not present

## 2022-01-18 DIAGNOSIS — I219 Acute myocardial infarction, unspecified: Secondary | ICD-10-CM

## 2022-01-18 HISTORY — DX: Acute myocardial infarction, unspecified: I21.9

## 2022-01-21 ENCOUNTER — Inpatient Hospital Stay (HOSPITAL_COMMUNITY)
Admission: EM | Admit: 2022-01-21 | Discharge: 2022-01-23 | DRG: 023 | Disposition: A | Discharge status: Home / Self Care | Payer: Federal, State, Local not specified - PPO | Attending: Neurology | Admitting: Neurology

## 2022-01-21 ENCOUNTER — Encounter (HOSPITAL_COMMUNITY)
Admission: EM | Disposition: A | Discharge status: Home / Self Care | Payer: Self-pay | Source: Home / Self Care | Attending: Neurology

## 2022-01-21 ENCOUNTER — Inpatient Hospital Stay (HOSPITAL_COMMUNITY): Payer: Federal, State, Local not specified - PPO

## 2022-01-21 ENCOUNTER — Emergency Department (HOSPITAL_COMMUNITY): Payer: Federal, State, Local not specified - PPO

## 2022-01-21 ENCOUNTER — Inpatient Hospital Stay (HOSPITAL_COMMUNITY): Payer: Federal, State, Local not specified - PPO | Admitting: Anesthesiology

## 2022-01-21 ENCOUNTER — Encounter (HOSPITAL_COMMUNITY): Payer: Self-pay

## 2022-01-21 ENCOUNTER — Other Ambulatory Visit: Payer: Self-pay

## 2022-01-21 DIAGNOSIS — I63512 Cerebral infarction due to unspecified occlusion or stenosis of left middle cerebral artery: Secondary | ICD-10-CM | POA: Diagnosis not present

## 2022-01-21 DIAGNOSIS — I214 Non-ST elevation (NSTEMI) myocardial infarction: Secondary | ICD-10-CM | POA: Diagnosis not present

## 2022-01-21 DIAGNOSIS — M21372 Foot drop, left foot: Secondary | ICD-10-CM | POA: Diagnosis not present

## 2022-01-21 DIAGNOSIS — R197 Diarrhea, unspecified: Secondary | ICD-10-CM | POA: Diagnosis present

## 2022-01-21 DIAGNOSIS — I63412 Cerebral infarction due to embolism of left middle cerebral artery: Secondary | ICD-10-CM

## 2022-01-21 DIAGNOSIS — I639 Cerebral infarction, unspecified: Principal | ICD-10-CM | POA: Diagnosis present

## 2022-01-21 DIAGNOSIS — J45909 Unspecified asthma, uncomplicated: Secondary | ICD-10-CM | POA: Diagnosis not present

## 2022-01-21 DIAGNOSIS — I513 Intracardiac thrombosis, not elsewhere classified: Secondary | ICD-10-CM | POA: Diagnosis present

## 2022-01-21 DIAGNOSIS — I110 Hypertensive heart disease with heart failure: Secondary | ICD-10-CM | POA: Diagnosis present

## 2022-01-21 DIAGNOSIS — Z880 Allergy status to penicillin: Secondary | ICD-10-CM

## 2022-01-21 DIAGNOSIS — Z79899 Other long term (current) drug therapy: Secondary | ICD-10-CM

## 2022-01-21 DIAGNOSIS — Z6832 Body mass index (BMI) 32.0-32.9, adult: Secondary | ICD-10-CM | POA: Diagnosis not present

## 2022-01-21 DIAGNOSIS — R29706 NIHSS score 6: Secondary | ICD-10-CM | POA: Diagnosis present

## 2022-01-21 DIAGNOSIS — I10 Essential (primary) hypertension: Secondary | ICD-10-CM | POA: Diagnosis present

## 2022-01-21 DIAGNOSIS — Z8249 Family history of ischemic heart disease and other diseases of the circulatory system: Secondary | ICD-10-CM

## 2022-01-21 DIAGNOSIS — F419 Anxiety disorder, unspecified: Secondary | ICD-10-CM | POA: Diagnosis not present

## 2022-01-21 DIAGNOSIS — I6501 Occlusion and stenosis of right vertebral artery: Secondary | ICD-10-CM | POA: Diagnosis not present

## 2022-01-21 DIAGNOSIS — I6310 Cerebral infarction due to embolism of unspecified precerebral artery: Secondary | ICD-10-CM

## 2022-01-21 DIAGNOSIS — I959 Hypotension, unspecified: Secondary | ICD-10-CM | POA: Diagnosis not present

## 2022-01-21 DIAGNOSIS — R4701 Aphasia: Secondary | ICD-10-CM | POA: Diagnosis not present

## 2022-01-21 DIAGNOSIS — R0682 Tachypnea, not elsewhere classified: Secondary | ICD-10-CM | POA: Diagnosis present

## 2022-01-21 DIAGNOSIS — I6389 Other cerebral infarction: Secondary | ICD-10-CM | POA: Diagnosis not present

## 2022-01-21 DIAGNOSIS — E7849 Other hyperlipidemia: Secondary | ICD-10-CM | POA: Diagnosis present

## 2022-01-21 DIAGNOSIS — M5412 Radiculopathy, cervical region: Secondary | ICD-10-CM | POA: Diagnosis not present

## 2022-01-21 DIAGNOSIS — R531 Weakness: Secondary | ICD-10-CM | POA: Diagnosis not present

## 2022-01-21 DIAGNOSIS — M48061 Spinal stenosis, lumbar region without neurogenic claudication: Secondary | ICD-10-CM | POA: Diagnosis present

## 2022-01-21 DIAGNOSIS — Z888 Allergy status to other drugs, medicaments and biological substances status: Secondary | ICD-10-CM

## 2022-01-21 DIAGNOSIS — R29818 Other symptoms and signs involving the nervous system: Secondary | ICD-10-CM | POA: Diagnosis not present

## 2022-01-21 DIAGNOSIS — G4700 Insomnia, unspecified: Secondary | ICD-10-CM | POA: Diagnosis present

## 2022-01-21 DIAGNOSIS — E785 Hyperlipidemia, unspecified: Secondary | ICD-10-CM | POA: Diagnosis not present

## 2022-01-21 DIAGNOSIS — I6340 Cerebral infarction due to embolism of unspecified cerebral artery: Secondary | ICD-10-CM | POA: Diagnosis not present

## 2022-01-21 DIAGNOSIS — N393 Stress incontinence (female) (male): Secondary | ICD-10-CM | POA: Diagnosis not present

## 2022-01-21 DIAGNOSIS — I5020 Unspecified systolic (congestive) heart failure: Secondary | ICD-10-CM | POA: Diagnosis present

## 2022-01-21 DIAGNOSIS — Z91040 Latex allergy status: Secondary | ICD-10-CM

## 2022-01-21 DIAGNOSIS — R29810 Facial weakness: Secondary | ICD-10-CM | POA: Diagnosis present

## 2022-01-21 DIAGNOSIS — Z87442 Personal history of urinary calculi: Secondary | ICD-10-CM

## 2022-01-21 DIAGNOSIS — Z881 Allergy status to other antibiotic agents status: Secondary | ICD-10-CM

## 2022-01-21 DIAGNOSIS — E669 Obesity, unspecified: Secondary | ICD-10-CM | POA: Diagnosis not present

## 2022-01-21 DIAGNOSIS — I6523 Occlusion and stenosis of bilateral carotid arteries: Secondary | ICD-10-CM | POA: Diagnosis not present

## 2022-01-21 DIAGNOSIS — F5105 Insomnia due to other mental disorder: Secondary | ICD-10-CM | POA: Diagnosis present

## 2022-01-21 DIAGNOSIS — K219 Gastro-esophageal reflux disease without esophagitis: Secondary | ICD-10-CM | POA: Diagnosis present

## 2022-01-21 DIAGNOSIS — Z91013 Allergy to seafood: Secondary | ICD-10-CM

## 2022-01-21 DIAGNOSIS — I6602 Occlusion and stenosis of left middle cerebral artery: Secondary | ICD-10-CM | POA: Diagnosis not present

## 2022-01-21 HISTORY — PX: IR PERCUTANEOUS ART THROMBECTOMY/INFUSION INTRACRANIAL INC DIAG ANGIO: IMG6087

## 2022-01-21 HISTORY — DX: Cerebral infarction, unspecified: I63.9

## 2022-01-21 HISTORY — PX: RADIOLOGY WITH ANESTHESIA: SHX6223

## 2022-01-21 HISTORY — PX: IR CT HEAD LTD: IMG2386

## 2022-01-21 LAB — ECHOCARDIOGRAM COMPLETE
Area-P 1/2: 3.11 cm2
Calc EF: 49.3 %
Height: 61 in
S' Lateral: 2.5 cm
Single Plane A2C EF: 46.8 %
Single Plane A4C EF: 50 %
Weight: 2736 oz

## 2022-01-21 LAB — CBC WITH DIFFERENTIAL/PLATELET
Abs Immature Granulocytes: 0.01 10*3/uL (ref 0.00–0.07)
Basophils Absolute: 0 10*3/uL (ref 0.0–0.1)
Basophils Relative: 1 %
Eosinophils Absolute: 0.1 10*3/uL (ref 0.0–0.5)
Eosinophils Relative: 1 %
HCT: 37.2 % (ref 36.0–46.0)
Hemoglobin: 12.5 g/dL (ref 12.0–15.0)
Immature Granulocytes: 0 %
Lymphocytes Relative: 31 %
Lymphs Abs: 1.6 10*3/uL (ref 0.7–4.0)
MCH: 28.7 pg (ref 26.0–34.0)
MCHC: 33.6 g/dL (ref 30.0–36.0)
MCV: 85.5 fL (ref 80.0–100.0)
Monocytes Absolute: 0.4 10*3/uL (ref 0.1–1.0)
Monocytes Relative: 9 %
Neutro Abs: 2.9 10*3/uL (ref 1.7–7.7)
Neutrophils Relative %: 58 %
Platelets: 175 10*3/uL (ref 150–400)
RBC: 4.35 MIL/uL (ref 3.87–5.11)
RDW: 11.6 % (ref 11.5–15.5)
WBC: 4.9 10*3/uL (ref 4.0–10.5)
nRBC: 0 % (ref 0.0–0.2)

## 2022-01-21 LAB — URINALYSIS, ROUTINE W REFLEX MICROSCOPIC
Bilirubin Urine: NEGATIVE
Glucose, UA: NEGATIVE mg/dL
Hgb urine dipstick: NEGATIVE
Ketones, ur: NEGATIVE mg/dL
Leukocytes,Ua: NEGATIVE
Nitrite: NEGATIVE
Protein, ur: NEGATIVE mg/dL
Specific Gravity, Urine: 1.011 (ref 1.005–1.030)
pH: 5 (ref 5.0–8.0)

## 2022-01-21 LAB — MAGNESIUM: Magnesium: 2.1 mg/dL (ref 1.7–2.4)

## 2022-01-21 LAB — RAPID URINE DRUG SCREEN, HOSP PERFORMED
Amphetamines: NOT DETECTED
Barbiturates: NOT DETECTED
Benzodiazepines: POSITIVE — AB
Cocaine: NOT DETECTED
Opiates: NOT DETECTED
Tetrahydrocannabinol: POSITIVE — AB

## 2022-01-21 LAB — COMPREHENSIVE METABOLIC PANEL
ALT: 16 U/L (ref 0–44)
AST: 19 U/L (ref 15–41)
Albumin: 4.1 g/dL (ref 3.5–5.0)
Alkaline Phosphatase: 47 U/L (ref 38–126)
Anion gap: 11 (ref 5–15)
BUN: 18 mg/dL (ref 8–23)
CO2: 22 mmol/L (ref 22–32)
Calcium: 10 mg/dL (ref 8.9–10.3)
Chloride: 105 mmol/L (ref 98–111)
Creatinine, Ser: 0.82 mg/dL (ref 0.44–1.00)
GFR, Estimated: >60 mL/min (ref >60)
Glucose, Bld: 94 mg/dL (ref 70–99)
Potassium: 3.7 mmol/L (ref 3.5–5.1)
Sodium: 138 mmol/L (ref 135–145)
Total Bilirubin: 1 mg/dL (ref 0.3–1.2)
Total Protein: 6.8 g/dL (ref 6.5–8.1)

## 2022-01-21 LAB — PROTIME-INR
INR: 1.1 (ref 0.8–1.2)
Prothrombin Time: 13.8 seconds (ref 11.4–15.2)

## 2022-01-21 LAB — ETHANOL: Alcohol, Ethyl (B): <10 mg/dL (ref <10)

## 2022-01-21 LAB — MRSA NEXT GEN BY PCR, NASAL: MRSA by PCR Next Gen: NOT DETECTED

## 2022-01-21 LAB — TROPONIN I (HIGH SENSITIVITY)
Troponin I (High Sensitivity): 341 ng/L (ref <18)
Troponin I (High Sensitivity): 377 ng/L (ref <18)

## 2022-01-21 SURGERY — IR WITH ANESTHESIA
Anesthesia: General

## 2022-01-21 MED ORDER — ACETAMINOPHEN 650 MG RE SUPP
650.0000 mg | RECTAL | Status: DC | PRN
  Administered 2022-01-21 – 2022-01-22 (×2): 650 mg via RECTAL

## 2022-01-21 MED ORDER — HEPARIN (PORCINE) 25000 UT/250ML-% IV SOLN
900.0000 [IU]/h | INTRAVENOUS | Status: DC
  Administered 2022-01-21: 900 [IU]/h via INTRAVENOUS
  Filled 2022-01-21: qty 250

## 2022-01-21 MED ORDER — TRIAZOLAM 0.25 MG PO TABS: 0.2500 mg | ORAL_TABLET | Freq: Every day | ORAL | Status: DC

## 2022-01-21 MED ORDER — LACTATED RINGERS IV SOLN: INTRAVENOUS | Status: DC | PRN

## 2022-01-21 MED ORDER — FENTANYL CITRATE (PF) 100 MCG/2ML IJ SOLN
INTRAMUSCULAR | Status: AC
  Filled 2022-01-21: qty 2

## 2022-01-21 MED ORDER — ALPRAZOLAM 0.25 MG PO TABS
0.5000 mg | ORAL_TABLET | Freq: Once | ORAL | Status: AC
  Administered 2022-01-21: 0.5 mg via ORAL
  Filled 2022-01-21: qty 2

## 2022-01-21 MED ORDER — ALPRAZOLAM 0.5 MG PO TABS
0.5000 mg | ORAL_TABLET | Freq: Two times a day (BID) | ORAL | Status: DC | PRN
Start: 2022-01-21 — End: 2022-01-23
  Administered 2022-01-22: 0.5 mg via ORAL
  Filled 2022-01-21: qty 1

## 2022-01-21 MED ORDER — ACETAMINOPHEN 650 MG RE SUPP
650.0000 mg | RECTAL | Status: DC | PRN
  Filled 2022-01-21 (×2): qty 1

## 2022-01-21 MED ORDER — SUGAMMADEX SODIUM 200 MG/2ML IV SOLN
INTRAVENOUS | Status: DC | PRN
  Administered 2022-01-21: 200 mg via INTRAVENOUS

## 2022-01-21 MED ORDER — ACETAMINOPHEN 325 MG PO TABS
650.0000 mg | ORAL_TABLET | ORAL | Status: DC | PRN
  Administered 2022-01-22: 650 mg via ORAL
  Filled 2022-01-21 (×5): qty 2

## 2022-01-21 MED ORDER — PHENYLEPHRINE HCL (PRESSORS) 10 MG/ML IV SOLN
INTRAVENOUS | Status: DC | PRN
  Administered 2022-01-21 (×2): 80 ug via INTRAVENOUS

## 2022-01-21 MED ORDER — PHENYLEPHRINE HCL-NACL 20-0.9 MG/250ML-% IV SOLN
INTRAVENOUS | Status: DC | PRN
  Administered 2022-01-21: 40 ug/min via INTRAVENOUS

## 2022-01-21 MED ORDER — ASPIRIN 81 MG PO CHEW
324.0000 mg | CHEWABLE_TABLET | Freq: Once | ORAL | Status: AC
  Administered 2022-01-21: 324 mg via ORAL
  Filled 2022-01-21: qty 4

## 2022-01-21 MED ORDER — HEPARIN BOLUS VIA INFUSION
500.0000 [IU] | Freq: Once | INTRAVENOUS | Status: AC
  Administered 2022-01-21: 500 [IU] via INTRAVENOUS

## 2022-01-21 MED ORDER — CLEVIDIPINE BUTYRATE 0.5 MG/ML IV EMUL: 0.0000 mg/h | INTRAVENOUS | Status: DC

## 2022-01-21 MED ORDER — SODIUM CHLORIDE 0.9 % IV SOLN: INTRAVENOUS | Status: AC

## 2022-01-21 MED ORDER — ACETAMINOPHEN 160 MG/5ML PO SOLN: 650.0000 mg | ORAL | Status: DC | PRN

## 2022-01-21 MED ORDER — ONDANSETRON HCL 4 MG/2ML IJ SOLN
INTRAMUSCULAR | Status: DC | PRN
  Administered 2022-01-21: 4 mg via INTRAVENOUS

## 2022-01-21 MED ORDER — CYCLOBENZAPRINE HCL 10 MG PO TABS
10.0000 mg | ORAL_TABLET | Freq: Three times a day (TID) | ORAL | Status: DC | PRN
Start: 2022-01-21 — End: 2022-01-23

## 2022-01-21 MED ORDER — DEXAMETHASONE SODIUM PHOSPHATE 10 MG/ML IJ SOLN
INTRAMUSCULAR | Status: DC | PRN
  Administered 2022-01-21: 5 mg via INTRAVENOUS

## 2022-01-21 MED ORDER — LABETALOL HCL 5 MG/ML IV SOLN
INTRAVENOUS | Status: DC | PRN
  Administered 2022-01-21: 10 mg via INTRAVENOUS

## 2022-01-21 MED ORDER — ACETAMINOPHEN 325 MG PO TABS
650.0000 mg | ORAL_TABLET | ORAL | Status: DC | PRN
  Administered 2022-01-22 – 2022-01-23 (×4): 650 mg via ORAL

## 2022-01-21 MED ORDER — GUAIFENESIN ER 600 MG PO TB12: 600.0000 mg | ORAL_TABLET | Freq: Two times a day (BID) | ORAL | Status: DC | PRN

## 2022-01-21 MED ORDER — MELATONIN 3 MG PO TABS: 3.0000 mg | ORAL_TABLET | Freq: Every day | ORAL | Status: DC

## 2022-01-21 MED ORDER — STROKE: EARLY STAGES OF RECOVERY BOOK: Freq: Once | Status: AC

## 2022-01-21 MED ORDER — SENNOSIDES-DOCUSATE SODIUM 8.6-50 MG PO TABS: 1.0000 | ORAL_TABLET | Freq: Every evening | ORAL | Status: DC | PRN

## 2022-01-21 MED ORDER — IOHEXOL 350 MG/ML SOLN
100.0000 mL | Freq: Once | INTRAVENOUS | Status: AC | PRN
  Administered 2022-01-21: 100 mL via INTRAVENOUS

## 2022-01-21 MED ORDER — LIDOCAINE HCL (CARDIAC) PF 100 MG/5ML IV SOSY
PREFILLED_SYRINGE | INTRAVENOUS | Status: DC | PRN
  Administered 2022-01-21: 40 mg via INTRAVENOUS

## 2022-01-21 MED ORDER — LORATADINE 10 MG PO TABS
10.0000 mg | ORAL_TABLET | ORAL | Status: DC
  Administered 2022-01-22 – 2022-01-23 (×2): 10 mg via ORAL
  Filled 2022-01-21 (×2): qty 1

## 2022-01-21 MED ORDER — CHLORHEXIDINE GLUCONATE CLOTH 2 % EX PADS
6.0000 | MEDICATED_PAD | Freq: Every day | CUTANEOUS | Status: DC
  Administered 2022-01-21: 6 via TOPICAL

## 2022-01-21 MED ORDER — MIRABEGRON ER 25 MG PO TB24
25.0000 mg | ORAL_TABLET | Freq: Every day | ORAL | Status: DC
  Administered 2022-01-22 – 2022-01-23 (×2): 25 mg via ORAL
  Filled 2022-01-21 (×2): qty 1

## 2022-01-21 MED ORDER — SUCCINYLCHOLINE CHLORIDE 200 MG/10ML IV SOSY
PREFILLED_SYRINGE | INTRAVENOUS | Status: DC | PRN
  Administered 2022-01-21: 120 mg via INTRAVENOUS

## 2022-01-21 MED ORDER — FENTANYL CITRATE (PF) 100 MCG/2ML IJ SOLN
INTRAMUSCULAR | Status: DC | PRN
  Administered 2022-01-21: 100 ug via INTRAVENOUS

## 2022-01-21 MED ORDER — PROPOFOL 10 MG/ML IV BOLUS
INTRAVENOUS | Status: DC | PRN
  Administered 2022-01-21: 150 mg via INTRAVENOUS

## 2022-01-21 MED ORDER — IOHEXOL 300 MG/ML  SOLN
100.0000 mL | Freq: Once | INTRAMUSCULAR | Status: AC | PRN
  Administered 2022-01-21: 73 mL via INTRA_ARTERIAL

## 2022-01-21 MED ORDER — ROCURONIUM BROMIDE 100 MG/10ML IV SOLN
INTRAVENOUS | Status: DC | PRN
  Administered 2022-01-21: 50 mg via INTRAVENOUS

## 2022-01-21 NOTE — Anesthesia Postprocedure Evaluation (Signed)
Anesthesia Post Note  Patient: Chloe Johnson  Procedure(s) Performed: IR WITH ANESTHESIA     Patient location during evaluation: ICU Anesthesia Type: General Level of consciousness: awake and alert and patient cooperative (moves all extrem to command) Pain management: pain level controlled Vital Signs Assessment: post-procedure vital signs reviewed and stable Respiratory status: nonlabored ventilation, spontaneous breathing, respiratory function stable and patient connected to nasal cannula oxygen Cardiovascular status: blood pressure returned to baseline and stable Postop Assessment: no apparent nausea or vomiting Anesthetic complications: no   No notable events documented.  Last Vitals:  Vitals:   01/21/22 1849 01/21/22 2050  BP: (!) 167/97   Pulse: 81 70  Resp: 17 15  Temp:    SpO2: 100% 100%    Last Pain:  Vitals:   01/21/22 2050  TempSrc:   PainSc: 0-No pain                 Dalores Weger,E. Marji Kuehnel

## 2022-01-21 NOTE — ED Notes (Signed)
Patient's daughter is at bedside, she states that her mother's speech has gotten worse since she has been here today. She feels like her sentences are nonsensical and patient seems to be struggling for words more in the last 20-30 minutes. Dr. Durwin Nora made aware, Dr. Durwin Nora of admitting team made aware.

## 2022-01-21 NOTE — Procedures (Signed)
INTERVENTIONAL NEURORADIOLOGY BRIEF POSTPROCEDURE NOTE  DIAGNOSTIC CEREBRAL ANGIOGRAM AND MECHANICAL THROMBECTOMY  Attending: Dr. Baldemar Lenis  Assistant: Dr. Marjo Bicker  Diagnosis: Left M2/MCA occlusion  Access site: RCFA, 42F  Access closure: Perclose prostyle  Anesthesia: GETA  Medication used: refer to anesthesia documentation.  Complications: None.  Estimated blood loss: 50 mL  Specimen: None.  Findings: There was a distal left M2/proximal M3 occlusion. Mechanical thrombectomy performed with direct contact aspiration with complete recanalization after one pass (TICI 2C) with slow flow in distal cortical branches.  The patient tolerated the procedure well without incident or complication and is in stable condition.   PLAN: - Bed rest x6 hours - SBP 120-140 mmHg

## 2022-01-21 NOTE — ED Triage Notes (Signed)
Had sudden onset of aphasia at 0930 and dropped cat bowls, lasted a few seconds and went away then had two more episodes which have now resolved.  LKN [id number]  Stroke screen negative with EMS and patient has no complaints at this time. EMS arrived at 1020 and has had no symptoms with them. Patient is suppose to have fusions of C4 and 4 other levels in sept.  Numbness to left arm since April.  And Pain in her shoulder blade but gone today. 20gLAC CBG 108 BP 137/89 O2 100% HR 78

## 2022-01-21 NOTE — Transfer of Care (Signed)
Immediate Anesthesia Transfer of Care Note  Patient: Chloe Johnson  Procedure(s) Performed: IR WITH ANESTHESIA  Patient Location: ICU  Anesthesia Type:General  Level of Consciousness: awake, alert  and oriented  Airway & Oxygen Therapy: Patient Spontanous Breathing and Patient connected to nasal cannula oxygen  Post-op Assessment: Report given to RN, Post -op Vital signs reviewed and stable and Patient moving all extremities  Post vital signs: Reviewed and stable  Last Vitals:  Vitals Value Taken Time  BP 133/94 01/21/22 2115  Temp    Pulse 69 01/21/22 2122  Resp 17 01/21/22 2122  SpO2 100 % 01/21/22 2122  Vitals shown include unvalidated device data.  Last Pain:  Vitals:   01/21/22 2050  TempSrc:   PainSc: 0-No pain         Complications: No notable events documented.

## 2022-01-21 NOTE — Anesthesia Preprocedure Evaluation (Addendum)
Anesthesia Evaluation  Patient identified by MRN, date of birth, ID band Patient awake  General Assessment Comment:aphasia  Reviewed: Allergy & Precautions, NPO status , Patient's Chart, lab work & pertinent test results, Unable to perform ROS - Chart review onlyPreop documentation limited or incomplete due to emergent nature of procedure.  History of Anesthesia Complications Negative for: history of anesthetic complications  Airway Mallampati: II  TM Distance: >3 FB Neck ROM: Full    Dental  (+) Dental Advisory Given   Pulmonary neg pulmonary ROS,    breath sounds clear to auscultation       Cardiovascular hypertension, Pt. on medications (-) angina+ Past MI   Rhythm:Regular Rate:Normal  01/21/2022 ECHO: EF 45-50%, mildly decreased LVF, apical hypokinesis, LV thrombus, normal RVF, no significant valvular abnormalities   Neuro/Psych Anxiety aphasia, right-sided facial droop weakness.  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  Patient was out of the window for tPA.  Echocardiogram revealed LV thrombus CVA (Acute)    GI/Hepatic Neg liver ROS, GERD  Controlled,  Endo/Other  negative endocrine ROSBMI 32  Renal/GU stones     Musculoskeletal   Abdominal (+) + obese,   Peds  Hematology   Anesthesia Other Findings   Reproductive/Obstetrics                            Anesthesia Physical Anesthesia Plan  ASA: 4  Anesthesia Plan: General   Post-op Pain Management: Minimal or no pain anticipated   Induction: Intravenous  PONV Risk Score and Plan: 3 and Ondansetron and Treatment may vary due to age or medical condition  Airway Management Planned: Oral ETT  Additional Equipment: Arterial line  Intra-op Plan:   Post-operative Plan: Possible Post-op intubation/ventilation  Informed Consent: I have reviewed the patients History and Physical, chart, labs and discussed the  procedure including the risks, benefits and alternatives for the proposed anesthesia with the patient or authorized representative who has indicated his/her understanding and acceptance.     Dental advisory given and Consent reviewed with POA  Plan Discussed with: Surgeon and CRNA  Anesthesia Plan Comments: (Discussed with pt's daughter, and patient)       Anesthesia Quick Evaluation

## 2022-01-21 NOTE — Progress Notes (Signed)
Echocardiogram 2D Echocardiogram has been performed.  Warren Lacy Yuriana Gaal RDCS 01/21/2022, 3:41 PM  Dr. Elease Hashimoto notified at 3:40

## 2022-01-21 NOTE — Anesthesia Procedure Notes (Signed)
Procedure Name: Intubation Date/Time: 01/21/2022 7:34 PM  Performed by: Matilyn Fehrman T, CRNAPre-anesthesia Checklist: Patient identified, Emergency Drugs available, Suction available and Patient being monitored Patient Re-evaluated:Patient Re-evaluated prior to induction Oxygen Delivery Method: Circle system utilized Preoxygenation: Pre-oxygenation with 100% oxygen Induction Type: IV induction, Rapid sequence and Cricoid Pressure applied Ventilation: Mask ventilation without difficulty Laryngoscope Size: Mac and 4 Grade View: Grade I Tube type: Oral Tube size: 7.5 mm Number of attempts: 1 Airway Equipment and Method: Stylet and Oral airway Placement Confirmation: ETT inserted through vocal cords under direct vision, positive ETCO2 and breath sounds checked- equal and bilateral Secured at: 22 cm Tube secured with: Tape Dental Injury: Teeth and Oropharynx as per pre-operative assessment

## 2022-01-21 NOTE — ED Provider Notes (Addendum)
[facility] EMERGENCY DEPARTMENT Provider Note   CSN: [id number] Arrival date & time: 01/21/22  1054     History  Chief Complaint  Patient presents with   Neurologic Problem    Chloe Johnson is a 66 y.o. female.  HPI Patient presents for transient strokelike symptoms.  Medical history includes anxiety, asthma, GERD, HTN.  She is currently being worked up for cervical spinal stenosis.  She had an episode of vertigo, nausea, and vomiting Saturday night.  She had generalized weakness on Sunday.  She was in her normal state of health yesterday and this morning.  At approximately 9:30 AM, she experienced expressive aphasia.  She can think of the word she wanted to say but was unable to state.  This was witnessed by her husband, who also reports that she had a facial droop.  Patient also describes a full body loss of sensation and weakness.  This lasted for less than 1 minute.  She had 2 subsequent episodes which were also short-lived.  EMS arrived on scene at 1020.  At that time, patient was asymptomatic and had no evidence of neurologic deficits on exam.  She does report that she felt a brief episode of aphasia on arrival at the hospital.  Currently, she denies any symptoms.  CBG prior to arrival was 108.  Patient was started on a new medication for stress incontinence but denies any other medication changes.    Home Medications Prior to Admission medications   Medication Sig Start Date End Date Taking? Authorizing Provider  ALPRAZolam Prudy Feeler) 0.5 MG tablet Take 0.5 mg by mouth at bedtime. 12/23/21  Yes [provider]  cyclobenzaprine (FLEXERIL) 10 MG tablet Take 1 tablet (10 mg total) by mouth 3 (three) times daily as needed for muscle spasms. 11/17/14  Yes Tia Alert, MD  guaiFENesin (MUCINEX) 600 MG 12 hr tablet Take 600 mg by mouth as needed for cough or to loosen phlegm.   Yes [provider]  lisinopril-hydrochlorothiazide (PRINZIDE,ZESTORETIC)  10-12.5 MG per tablet Take 1 tablet by mouth daily.   Yes [provider]  loratadine (CLARITIN) 10 MG tablet Take 10 mg by mouth every morning.   Yes [provider]  meloxicam (MOBIC) 7.5 MG tablet Take 7.5 mg by mouth in the morning and at bedtime. 12/31/21  Yes [provider]  MYRBETRIQ 25 MG TB24 tablet Take 25 mg by mouth daily. 12/31/21  Yes [provider]  potassium chloride SA (K-DUR,KLOR-CON) 20 MEQ tablet Take 20 mEq by mouth daily.   Yes [provider]  triazolam (HALCION) 0.25 MG tablet Take 0.25 mg by mouth at bedtime. 11/25/21  Yes [provider]  oxyCODONE-acetaminophen (PERCOCET) 5-325 MG per tablet 1 to 2 tablets every 6 hours as needed for pain. Patient not taking: Reported on 01/21/2022 11/17/14   Tia Alert, MD      Allergies    Doxycycline, Erythromycin, Iodine, Latex, Shellfish allergy, Strawberry extract, Vancomycin, Vicodin [hydrocodone-acetaminophen], Dilaudid [hydromorphone hcl], Keflex [cephalexin], Penicillins, and Sulfa antibiotics    Review of Systems   Review of Systems  Neurological:  Positive for facial asymmetry, speech difficulty, weakness and numbness.  All other systems reviewed and are negative.   Physical Exam Updated Vital Signs BP (!) 149/85   Pulse 73   Temp 97.8 F (36.6 C) (Oral)   Resp 18   Ht 5\' 1"  (1.549 m)   Wt 77.6 kg   SpO2 100%   BMI 32.31 kg/m  Physical Exam Vitals and nursing note reviewed.  Constitutional:      General: She is not in acute distress.    Appearance: Normal appearance. She is well-developed. She is not ill-appearing, toxic-appearing or diaphoretic.  HENT:     Head: Normocephalic and atraumatic.     Right Ear: External ear normal.     Left Ear: External ear normal.     Nose: Nose normal.     Mouth/Throat:     Mouth: Mucous membranes are moist.     Pharynx: Oropharynx is clear.  Eyes:     Extraocular Movements: Extraocular movements intact.      Conjunctiva/sclera: Conjunctivae normal.  Cardiovascular:     Rate and Rhythm: Normal rate and regular rhythm.     Heart sounds: No murmur heard. Pulmonary:     Effort: Pulmonary effort is normal. No respiratory distress.  Abdominal:     General: There is no distension.     Palpations: Abdomen is soft.     Tenderness: There is no abdominal tenderness.  Musculoskeletal:        General: No swelling. Normal range of motion.     Cervical back: Normal range of motion and neck supple.  Skin:    General: Skin is warm and dry.     Coloration: Skin is not jaundiced or pale.  Neurological:     General: No focal deficit present.     Mental Status: She is alert and oriented to person, place, and time.     Cranial Nerves: Cranial nerves 2-12 are intact. No cranial nerve deficit, dysarthria or facial asymmetry.     Sensory: Sensation is intact. No sensory deficit.     Motor: Motor function is intact. No weakness, abnormal muscle tone or pronator drift.     Coordination: Coordination is intact. Coordination normal. Finger-Nose-Finger Test normal.  Psychiatric:        Mood and Affect: Mood normal.        Behavior: Behavior normal.        Thought Content: Thought content normal.        Judgment: Judgment normal.     ED Results / Procedures / Treatments   Labs (all labs ordered are listed, but only abnormal results are displayed) Labs Reviewed  RAPID URINE DRUG SCREEN, HOSP PERFORMED - Abnormal; Notable for the following components:      Result Value   Benzodiazepines POSITIVE (*)    Tetrahydrocannabinol POSITIVE (*)    All other components within normal limits  URINALYSIS, ROUTINE W REFLEX MICROSCOPIC - Abnormal; Notable for the following components:   APPearance HAZY (*)    All other components within normal limits  TROPONIN I (HIGH SENSITIVITY) - Abnormal; Notable for the following components:   Troponin I (High Sensitivity) 341 (*)    All other components within normal limits   TROPONIN I (HIGH SENSITIVITY) - Abnormal; Notable for the following components:   Troponin I (High Sensitivity) 377 (*)    All other components within normal limits  COMPREHENSIVE METABOLIC PANEL  ETHANOL  PROTIME-INR  CBC WITH DIFFERENTIAL/PLATELET  MAGNESIUM  HIV ANTIBODY (ROUTINE TESTING W REFLEX)  LIPID PANEL  HEMOGLOBIN A1C  CBC  HEPARIN LEVEL (UNFRACTIONATED)  HEPARIN LEVEL (UNFRACTIONATED)    EKG EKG Interpretation  Date/Time:  Tuesday January 21 2022 11:05:23 EDT Ventricular Rate:  76 PR Interval:  72 QRS Duration: 92 QT Interval:  411 QTC Calculation: 463 R Axis:   80 Text Interpretation: Sinus rhythm Short PR interval Abnormal T, probable  ischemia, widespread Confirmed by Gloris Manchester [id number]) on 01/21/2022 11:08:19 AM  Radiology ECHOCARDIOGRAM COMPLETE  Result Date: 01/21/2022    ECHOCARDIOGRAM REPORT   Patient Name:   PAMELA INTRIERI Frutiger Date of Exam: 01/21/2022 Medical Rec #:  [id number]    Height:       61.0 in Accession #:    [id number]   Weight:       171.0 lb Date of Birth:  03/11/1956    BSA:          1.767 m Patient Age:    66 years     BP:           146/125 mmHg Patient Gender: F            HR:           72 bpm. Exam Location:  Inpatient Procedure: 2D Echo, Color Doppler and Cardiac Doppler Indications:    Stroke i63.9  History:        Patient has no prior history of Echocardiogram examinations.                 Risk Factors:Hypertension.  Sonographer:    Irving Burton Senior RDCS Referring Phys: [id number] LAURA R INGOLD IMPRESSIONS  1. Left ventricular ejection fraction, by estimation, is 45 to 50%. The left ventricle has mildly decreased function. The left ventricle demonstrates regional wall motion abnormalities (apical hypokinesis with out true aneurysm and with presence of an LV thrombus 1.84 X 0.9 X 1.2 cm).  2. The mitral valve is grossly normal. No evidence of mitral valve regurgitation. No evidence of mitral stenosis.  3. The aortic valve is tricuspid. Aortic valve regurgitation is  not visualized. No aortic stenosis is present.  4. Right ventricular systolic function is normal. The right ventricular size is normal. Tricuspid regurgitation signal is inadequate for assessing PA pressure.  5. The inferior vena cava is normal in size with greater than 50% respiratory variability, suggesting right atrial pressure of 3 mmHg. Comparison(s): No prior Echocardiogram. 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 Cardiology aware. FINDINGS  Left Ventricle: Left ventricular ejection fraction, by estimation, is 45 to 50%. The left ventricle has mildly decreased function. The left ventricle demonstrates regional wall motion abnormalities. The left ventricular internal cavity size was small. There is no left ventricular hypertrophy. Left ventricular diastolic parameters are consistent with Grade I diastolic dysfunction (impaired relaxation).  LV Wall Scoring: The apical lateral segment, apical septal segment, apical anterior segment, and apical inferior segment are hypokinetic. Right Ventricle: The right ventricular size is normal. No increase in right ventricular wall thickness. Right ventricular systolic function is normal. Tricuspid regurgitation signal is inadequate for assessing PA pressure. Left Atrium: Left atrial size was normal in size. Right Atrium: Right atrial size was normal in size. Pericardium: There is no evidence of pericardial effusion. Mitral Valve: The mitral valve is grossly normal. No evidence of mitral valve regurgitation. No evidence of mitral valve stenosis. Tricuspid Valve: The tricuspid valve is normal in structure. Tricuspid valve regurgitation is not demonstrated. No evidence of tricuspid stenosis. Aortic Valve: The aortic valve is tricuspid. Aortic valve regurgitation is not visualized. No aortic stenosis is present. Pulmonic Valve: The pulmonic valve was normal in structure. Pulmonic valve regurgitation is not visualized. No evidence of  pulmonic stenosis. Aorta: The aortic root and ascending aorta are structurally normal, with no evidence of dilitation. Venous: The inferior vena cava is normal in size with greater than 50% respiratory variability, suggesting right atrial pressure  of 3 mmHg. IAS/Shunts: No atrial level shunt detected by color flow Doppler.  LEFT VENTRICLE PLAX 2D LVIDd:         3.60 cm     Diastology LVIDs:         2.50 cm     LV e' medial:    6.68 cm/s LV PW:         0.80 cm     LV E/e' medial:  6.5 LV IVS:        0.80 cm     LV e' lateral:   11.00 cm/s LVOT diam:     1.70 cm     LV E/e' lateral: 3.9 LV SV:         42 LV SV Index:   24 LVOT Area:     2.27 cm  LV Volumes (MOD) LV vol d, MOD A2C: 70.1 ml LV vol d, MOD A4C: 46.6 ml LV vol s, MOD A2C: 37.3 ml LV vol s, MOD A4C: 23.3 ml LV SV MOD A2C:     32.8 ml LV SV MOD A4C:     46.6 ml LV SV MOD BP:      28.7 ml RIGHT VENTRICLE RV S prime:     9.32 cm/s TAPSE (M-mode): 1.7 cm LEFT ATRIUM             Index        RIGHT ATRIUM          Index LA diam:        2.70 cm 1.53 cm/m   RA Area:     9.52 cm LA Vol (A2C):   28.1 ml 15.90 ml/m  RA Volume:   20.50 ml 11.60 ml/m LA Vol (A4C):   26.3 ml 14.88 ml/m LA Biplane Vol: 28.8 ml 16.30 ml/m  AORTIC VALVE LVOT Vmax:   115.00 cm/s LVOT Vmean:  68.700 cm/s LVOT VTI:    0.185 m  AORTA Ao Root diam: 2.90 cm Ao Asc diam:  3.20 cm MITRAL VALVE MV Area (PHT): 3.11 cm    SHUNTS MV Decel Time: 244 msec    Systemic VTI:  0.18 m MV E velocity: 43.20 cm/s  Systemic Diam: 1.70 cm MV A velocity: 64.50 cm/s MV E/A ratio:  0.67 Riley Lam MD Electronically signed by Riley Lam MD Signature Date/Time: 01/21/2022/4:04:02 PM    Final    MR BRAIN WO CONTRAST  Result Date: 01/21/2022 CLINICAL DATA:  TIA.  Sudden onset of aphasia, now resolved. EXAM: MRI HEAD WITHOUT CONTRAST TECHNIQUE: Multiplanar, multiecho pulse sequences of the brain and surrounding structures were obtained without intravenous contrast. COMPARISON:  None  Available. FINDINGS: Brain: Small acute infarct in the left frontal Denault matter. No hemorrhage, hydrocephalus, mass, or atrophy. Minimal chronic small vessel changes may be present in the pons. No prior infarct is seen. Vascular: Major flow voids are preserved Skull and upper cervical spine: Normal marrow signal. Degenerative facet spurring where covered in the cervical spine with mild C2-3 anterolisthesis. Sinuses/Orbits: Negative IMPRESSION: Small acute infarct in the left frontal Sweitzer matter. Electronically Signed   By: Tiburcio Pea M.D.   On: 01/21/2022 12:32    Procedures Procedures    Medications Ordered in ED Medications   stroke: early stages of recovery book (has no administration in time range)  0.9 %  sodium chloride infusion (has no administration in time range)  acetaminophen (TYLENOL) tablet 650 mg (has no administration in time range)    Or  acetaminophen (TYLENOL) 160 MG/5ML  solution 650 mg (has no administration in time range)    Or  acetaminophen (TYLENOL) suppository 650 mg (has no administration in time range)  senna-docusate (Senokot-S) tablet 1 tablet (has no administration in time range)  heparin ADULT infusion 100 units/mL (25000 units/28mL) (900 Units/hr Intravenous New Bag/Given 01/21/22 1747)  triazolam (HALCION) tablet 0.25 mg (has no administration in time range)  cyclobenzaprine (FLEXERIL) tablet 10 mg (has no administration in time range)  ALPRAZolam (XANAX) tablet 0.5 mg (has no administration in time range)  mirabegron ER (MYRBETRIQ) tablet 25 mg (has no administration in time range)  loratadine (CLARITIN) tablet 10 mg (has no administration in time range)  guaiFENesin (MUCINEX) 12 hr tablet 600 mg (has no administration in time range)  aspirin chewable tablet 324 mg (324 mg Oral Given 01/21/22 1305)  ALPRAZolam (XANAX) tablet 0.5 mg (0.5 mg Oral Given 01/21/22 1744)    ED Course/ Medical Decision Making/ A&P                           Medical Decision  Making Amount and/or Complexity of Data Reviewed Labs: ordered. Radiology: ordered.  Risk OTC drugs. Decision regarding hospitalization.   This patient presents to the ED for concern of expressive aphasia, this involves an extensive number of treatment options, and is a complaint that carries with it a high risk of complications and morbidity.  The differential diagnosis includes CVA, TIA, hypoglycemia, partial seizure   Co morbidities that complicate the patient evaluation  anxiety, asthma, GERD, HTN   Additional history obtained:  Additional history obtained from patient's husband External records from outside source obtained and reviewed including EMR   Lab Tests:  I Ordered, and personally interpreted labs.  The pertinent results include: Normal hemoglobin, no leukocytosis, normal electrolytes, elevation in troponin: 341-->377   Imaging Studies ordered:  I ordered imaging studies including MRI brain I independently visualized and interpreted imaging which showed small acute infarct in left frontal Betancur matter I agree with the radiologist interpretation   Cardiac Monitoring: / EKG:  The patient was maintained on a cardiac monitor.  I personally viewed and interpreted the cardiac monitored which showed an underlying rhythm of: Sinus rhythm   Consultations Obtained:  I requested consultation with the neurologist, Dr. Amada Jupiter,  and discussed lab and imaging findings as well as pertinent plan - they recommend: (Recommendations to follow ED evaluation) I requested consultation with the cardiologist, Dr. Elease Hashimoto,  and discussed lab and imaging findings as well as pertinent plan - they recommend: (Recommendations to follow ED evaluation)   Problem List / ED Course / Critical interventions / Medication management  Patient is a healthy 66 year old female who presents for transient strokelike symptoms earlier today.  Patient describes an expressive aphasia.  This was  witnessed by her husband who also noted a facial droop.  She also endorses a loss of sensation throughout her body and weakness causing her to drop an object that she was holding.  Symptoms lasted only few minutes.  She had 2 more subsequent episodes of similar symptoms that were brief in duration.  On arrival in the ED, she is asymptomatic.  She is well-appearing on exam with no focal neurologic deficits.  Presentation is concerning for TIA.  Lab work, MRI brain, and CTA of head and neck were ordered.  On EKG, patient has diffuse T wave inversions.  Unfortunately, she does not have a prior EKG since 2015.  Troponins were added to lab  work.  Patient's initial troponin was elevated at 341.  Although patient has not had any symptoms of chest discomfort today, she does endorse a episode of nausea and vomiting on Saturday night.  She attributes this to associated vertigo that she was experiencing at that time.  She denies history of intermittent vertigo.  324 ASA was given.  MRI brain did show a small acute infarct in left frontal Marlett matter.  I consulted with cardiology and neurology.  Both specialty services to evaluate the patient in the ED.  Patient remained asymptomatic while in the ED.  She was admitted to medicine for further management. I ordered medication including ASA for NSTEMI Reevaluation of the patient after these medicines showed that the patient stayed the same I have reviewed the patients home medicines and have made adjustments as needed   Social Determinants of Health:  Has PCP  CRITICAL CARE Performed by: Gloris Manchester   Total critical care time: 35 minutes  Critical care time was exclusive of separately billable procedures and treating other patients.  Critical care was necessary to treat or prevent imminent or life-threatening deterioration.  Critical care was time spent personally by me on the following activities: development of treatment plan with patient and/or surrogate as  well as nursing, discussions with consultants, evaluation of patient's response to treatment, examination of patient, obtaining history from patient or surrogate, ordering and performing treatments and interventions, ordering and review of laboratory studies, ordering and review of radiographic studies, pulse oximetry and re-evaluation of patient's condition.        Final Clinical Impression(s) / ED Diagnoses Final diagnoses:  Cerebrovascular accident (CVA), unspecified mechanism (HCC)  NSTEMI (non-ST elevated myocardial infarction) [facility])    Rx / DC Orders ED Discharge Orders     None         Gloris Manchester, MD 01/21/22 Zollie Pee    Gloris Manchester, MD 01/21/22 1836

## 2022-01-21 NOTE — ED Notes (Signed)
Patient transported to IR with stroke RN

## 2022-01-21 NOTE — Progress Notes (Signed)
ANTICOAGULATION CONSULT NOTE - Initial Consult  Pharmacy Consult for Heparin infusion Indication: stroke  Allergies  Allergen Reactions   Doxycycline Nausea Only    malaise    Erythromycin Nausea And Vomiting   Iodine    Latex Other (See Comments)    Irritation of the skin   Shellfish Allergy Other (See Comments)    GI- Facial  Acne   Strawberry Extract Other (See Comments)    Mouth ulcers    Vancomycin    Vicodin [Hydrocodone-Acetaminophen] Other (See Comments)    Restless, tolerate percocet   Dilaudid [Hydromorphone Hcl] Rash    *Pt states she can take if given benadryl prior*   Keflex [Cephalexin] Rash   Penicillins Rash    Hives   Sulfa Antibiotics Rash    Hives    Patient Measurements: Height: 5\' 1"  (154.9 cm) Weight: 77.6 kg (171 lb) IBW/kg (Calculated) : 47.8 Heparin Dosing Weight: 65.1 kg  Vital Signs: Temp: 97.8 F (36.6 C) (08/29 1616) Temp Source: Oral (08/29 1616) BP: 149/85 (08/29 1616) Pulse Rate: 73 (08/29 1616)  Labs: Recent Labs    01/21/22 1102 01/21/22 1309 01/21/22 1445  HGB  --   --  12.5  HCT  --   --  37.2  PLT  --   --  175  LABPROT  --   --  13.8  INR  --   --  1.1  CREATININE  --   --  0.82  TROPONINIHS 341* 377*  --     Estimated Creatinine Clearance: 65.3 mL/min (by C-G formula based on SCr of 0.82 mg/dL).   Medical History: 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/16/2013   L3-4/notes 11/24/2013, arms, neck   Exertional asthma    GERD (gastroesophageal reflux disease)    Hepatitis    Hypertension    Kidney stones    "have always passed them"   Neck pain    Osteomyelitis (HCC) 11/2013   osteomyelitis, discitis    Sleep concern    uses Prozac for sleep     Medications:  (Not in a hospital admission)   Assessment: 66 yo F w/ PMH of anxiety, asthma, GERD, HTN, and spinal stenosis presented with elevated troponin and ECG changes in addition to stroke-like  symptoms of vertigo, nausea, vomiting, generalized weakness, and expressive aphasia. Patient does not take any anticoagulants at home. Pharmacy consulted to dose heparin infusion for stroke.   Hgb 13.8, Plt 175  Goal of Therapy:  Heparin level 0.3-0.5 units/ml Monitor platelets by anticoagulation protocol: Yes   Plan:  No bolus per stroke protocol Initiate heparin infusion at 900 units/hr  Check heparin level in 6 hours Monitor daily CBC, heparin level, and for s/sx of bleeding  77, PharmD, BCPS Clinical Pharmacist 01/21/2022 5:20 PM   Please refer to AMION for pharmacy phone number

## 2022-01-21 NOTE — ED Notes (Signed)
MD Katrinka Blazing called and notified of patient's change in speech.

## 2022-01-21 NOTE — Consult Note (Signed)
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 She presents today after the following series events.  Around 12 PM this past Saturday, the patient noted sudden onset dizziness described as "vertigo" followed by nausea then followed by an episode of vomiting.  She next felt a sudden onset sharp chest pain with radiation to her back.  She assumed it was her cervical radiculopathy "acting up" because shortly after this event, she was holding 2 plates in her hand to feed her cats when she suddenly had no control of both upper extremities and dropped said plates.  On Sunday, she felt generally weak but then improved from all of this until this morning, around 9 AM, when she experienced word finding difficulties.  Her husband, who is at bedside, also notes she had a right facial droop transiently that lasted no more than a few minutes.  She may have had 2 subsequent episodes, as documented by ED physician; however, all of her is told by the patient's husband that this was 1 single episode.  Currently, she does not feel at her baseline and her husband agrees.  She is still having intermittent word finding difficulties and tangential speech.  No seizure history nor seizure risk factors.  The patient currently denies headache, dizziness, visual changes, problems with swallowing, focal muscle weakness, new numbness or tingling of her extremities (left arm intermittent numbness is baseline since April from cervical radiculopathy), abnormal movements, or other focal neurological deficits.  She is found to have very small left  frontal lobe infarction on brain MRI her echocardiogram revealed an ejection fraction of 45-50%, apical hypokinesis, and a left ventricular thrombus.  ROS: Full ROS was performed and is negative except as noted in the HPI.  Premorbid modified Rankin scale (mRS):  0-Completely asymptomatic and back to baseline post-stroke PAST MEDICAL HISTORY   Past Medical History:  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/16/2013   L3-4/notes 11/24/2013, arms, neck   Exertional asthma    GERD (gastroesophageal reflux disease)    Hepatitis    Hypertension    Kidney stones    "have always passed them"   Neck pain    Osteomyelitis (HCC) 11/2013   osteomyelitis, discitis    Sleep concern    uses Prozac for sleep    No family history on file. Family History  Problem Relation Age of Onset   Other Father    Hypertension Father    Allergies:  Allergies  Allergen Reactions   Doxycycline Nausea Only    malaise    Erythromycin Nausea And Vomiting   Iodine    Latex Other (See Comments)    Irritation of the skin   Shellfish Allergy Other (See Comments)    GI- Facial  Acne   Strawberry Extract Other (See Comments)    Mouth ulcers    Vancomycin    Vicodin [Hydrocodone-Acetaminophen] Other (See Comments)    Restless, tolerate percocet   Dilaudid [Hydromorphone Hcl] Rash    *Pt states she can take if given benadryl prior*   Keflex [Cephalexin] Rash  Penicillins Rash    Hives   Sulfa Antibiotics Rash    Hives   Social History:   reports that she has never smoked. She has never used smokeless tobacco. She reports current alcohol use of about 6.0 standard drinks of alcohol per week. She reports that she does not use drugs.    Medications (Not in a hospital admission)  EXAMINATION    Current vital signs:    01/21/2022    2:15 PM 01/21/2022    2:00 PM 01/21/2022    1:45 PM  Vitals with BMI  Systolic 132 117 431  Diastolic 88 83 79   Pulse 69 68 68   Examination:  GENERAL: Awake, alert in NAD HEENT: - Normocephalic and atraumatic, dry mm, no lymphadenopathy, no Thyromegally LUNGS - Clear to auscultation bilaterally CV - S1S2 RRR, equal pulses bilaterally. ABDOMEN - Soft, nontender, nondistended with normoactive BS Ext: warm, well perfused, intact peripheral pulses, no pedal edema  NEURO:  Mental Status: AA&Ox3  Language: speech is slowed, tangential, perhaps very mildly dysarthric.  Cannot spell "world" forward or backward .In tact naming of both low and high-frequency words, intact repetition, slightly impaired fluency, and intact comprehension. Cranial Nerves:  II: PERRL. Visual fields full to confrontation.  III, IV, VI: EOM in tact. Eyelids elevate symmetrically.  V: Sensation intact V1-3 symmetrically  VII: no facial asymmetry   VIII: hearing intact to voice IX, X: Palate elevates symmetrically. Phonation is normal.  VQ:MGQQPYPP shrug 5/5 and symmetrical  XII: tongue is midline without fasciculations. Motor:  5/5 power strength throughout   Tone: is normal and bulk is normal DTRs: deferred  Sensation- Intact to light touch, pin prick, and temperature bilaterally Coordination: FTN intact bilaterally, no ataxia in BLE., no abnormal movements  Gait- deferred  NIHSS 1 dysarthria 1 aphasia 2 total   LABS   I have reviewed labs in epic and the results pertinent to this consultation are:   No results found for: "[facility]" Lab Results  Component Value Date   ALT 24 11/17/2013   AST 16 11/17/2013   ALKPHOS 97 11/17/2013   BILITOT <0.2 (L) 11/17/2013   No results found for: "HGBA1C" Lab Results  Component Value Date   WBC 4.9 01/21/2022   HGB 12.5 01/21/2022   HCT 37.2 01/21/2022   MCV 85.5 01/21/2022   PLT 175 01/21/2022   No results found for: "VITAMINB12" No results found for: "FOLATE" Lab Results  Component Value Date   NA 136 11/15/2014   K 3.8 11/15/2014   CL 100 (L) 11/15/2014    CO2 24 11/15/2014   DIAGNOSTIC IMAGING/PROCEDURES   I have reviewed the images obtained:, as below   MRI brain: Tiny area of diffusion restriction with ADC correlate left frontal Weill matter  ASSESSMENT/PLAN   Assessment:  Chloe Johnson is a 66 year old lady with a past medical history of anxiety, 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  She presents after transient episodes of aphasia this morning which followed sudden onset dizziness, nausea, vomiting, chest pain that radiated to upper back on Saturday.  MRI brain shows small ischemic infarct of the frontal Burgio matter on the left, likely a larger area of infarction that has resolved somewhat and explains her stuttering facial droop and aphasia.  Her troponins were elevated to over 300 and her echocardiogram showed apical hypokinesis and a left ventricular thrombus, making myocardial infarction on Saturday and resultant LV  thrombus formation and emboliziation etiology of this stroke.  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 Amada Jupiter.   -- Sanjuana Letters, PA-C Neurology Department   **This documentation was dictated using Dragon Medical Software and may contain inadvertent errors **  I have seen the patient reviewed the above note.  Following the above evaluation, at approximately 6 PM I was paged regarding the patient worsening.  On my initial evaluation, the patient still had fragmentary expression, but with significantly increased difficulty with communication, though she was still  able to understand.  She was taken for an emergent CTA/CTP which demonstrated a small area of penumbra but with an M2 or M3 occlusion that was potentially reachable via catheter.  Following those studies, the patient had markedly worsened, with only fragmentary expression, though still able to follow commands.  NIH at that time was a six, for inability to answer questions, mild right facial droop, severe aphasia.   Though the occlusion is slightly distal, it is likely reachable for attempted endovascular revascularization.  Given the severity of her speech, I discussed risks and benefits with the daughter who also discussed with the patient's husband.  Based on this discussion and decision to proceed with attempted revascularization given how significant her speech deficits were was made.  There is increased risk in the setting of a recent NSTEMI, I reached out to cardiology and her cardiac function is relatively preserved though with the recent MI and lack of catheter angiogram, there is some risk associated with this.  She received aspirin earlier, but given the etiology is almost certainly embolism from her cardiac thrombus, she was started on anticoagulation with heparin.  I would favor holding this for the time being until follow-up imaging after her intervention has been performed.  She will be admitted to the intensive care unit for frequent monitoring postoperatively.   Cardiology will continue to follow.  This patient is critically ill and at significant risk of neurological worsening, death and care requires constant monitoring of vital signs, hemodynamics,respiratory and cardiac monitoring, neurological assessment, discussion with family, other specialists and medical decision making of high complexity. I spent 70 minutes of neurocritical care time  in the care of  this patient. This was time spent independent of any time provided by nurse practitioner or PA.  Ritta Slot,  MD [facility] [phone]  If 7pm- 7am, please page neurology on call as listed in AMION. 01/21/2022  8:03 PM

## 2022-01-21 NOTE — Consult Note (Addendum)
Cardiology Consultation   Patient ID: Chloe Johnson MRN: [id number]; DOB: [birth year]  Admit date: 01/21/2022 Date of Consult: 01/21/2022  PCP:  Kirby Funk, MD   [facility] Providers Cardiologist:  New to Dr. Elease Hashimoto    Patient Profile:   Chloe Johnson is a 66 y.o. female with a hx of anxiety, asthma, GERD, HTN, spinal stenosis who is being seen 01/21/2022 for the evaluation of elevated troponin, EKG changes at the request of Dr. Durwin Nora.  History of Present Illness:   Chloe Johnson above medical history.  Per chart review, patient does not have any cardiac history and does not follow with cardiology.  Patient presented to the ER on 8/29 with transient strokelike symptoms.  Patient had an episode of vertigo, nausea, vomiting on 8/26.  She had generalized weakness the next day.  She was in her normal state of health yesterday and this morning, but at about 930 this morning she expressed variance expressive aphasia.  She was able to think of the words she wanted to say but was unable to state.  Husband witnessed this episode and reports that patient had a facial droop.  EMS was called and upon their arrival the patient was asymptomatic and had no evidence of neurological deficits on exam.  In the ED, initial hsTn elevated to 341.   EKG showed normal sinus rhythm with widespread T wave inversions. MRI head showed a small acute infarct in the left frontal Syverson matter.   On interview, patient reports that on Saturday she developed vertigo followed by nausea and vomiting. She also developed severe pain under her left shoulder blade. She noted that her apple watch was notifying her about her HR, but she was not sure that it was afib. She denied having any chest pain, palpitations on Saturday. On Sunday, her nausea and vomiting were improved, but she had generalized weakness. However, she was able to go about her day as usual. Yesterday (Monday) she was in her usual state of health. This AM,  patient woke up in her usual state of health. She was feeding her cats when she suddenly dropped one of the bowls, then the other. Could not speak, felt like she could think of the words she wanted to say but could not say them. She also had a full body loss of sensation and weakness which lasted for about 1 minute. Had 2 subsequent episodes that were very short lived.   Reports having a history of high blood pressure, hyperlipidemia (familial).  She did have coronary calcium scoring within the past year, reports her score was 0.  She denies any history of tobacco use, drug use.  Denies any family or personal history of cardiac issues. Is currently being worked up for lupus due to having a malar rash, other skin rashes, history of foot drop.    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/16/2013   L3-4/notes 11/24/2013, arms, neck   Exertional asthma    GERD (gastroesophageal reflux disease)    Hepatitis    Hypertension    Kidney stones    "have always passed them"   Neck pain    Osteomyelitis (HCC) 11/2013   osteomyelitis, discitis    Sleep concern    uses Prozac for sleep     Past Surgical History:  Procedure Laterality Date   BREAST CYST EXCISION Left 2010   "polypectomy"   PICC LINE  PLACE PERIPHERAL ([facility] HX) Right    for use of Levaquin & Vancomycin, of note: she reports that she had a "flulike feeling"    TONSILLECTOMY AND ADENOIDECTOMY  1975   TUBAL LIGATION  1999     Home Medications:  Prior to Admission medications   Medication Sig Start Date End Date Taking? Authorizing Provider  cyclobenzaprine (FLEXERIL) 10 MG tablet Take 1 tablet (10 mg total) by mouth 3 (three) times daily as needed for muscle spasms. 11/17/14   Tia Alert, MD  esomeprazole (NEXIUM) 40 MG capsule Take 40 mg by mouth every morning.     [provider]  EVENING PRIMROSE OIL PO Take 1,300 mg by mouth 2 (two) times daily.    [provider]  FLUoxetine (PROZAC) 20 MG capsule Take 20 mg by mouth daily before breakfast.  10/08/13   [provider]  gabapentin (NEURONTIN) 600 MG tablet Take 1 tablet (600 mg total) by mouth 3 (three) times daily. Patient taking differently: Take 600-1,200 mg by mouth 3 (three) times daily. 600 mg in the morning, 600 mg in the afternoon, and 1200 mg at bedtime 12/02/13   Kirby Funk, MD  guaiFENesin (MUCINEX) 600 MG 12 hr tablet Take 600 mg by mouth as needed for cough or to loosen phlegm.    [provider]  lisinopril-hydrochlorothiazide (PRINZIDE,ZESTORETIC) 10-12.5 MG per tablet Take 1 tablet by mouth daily.    [provider]  loratadine (CLARITIN) 10 MG tablet Take 10 mg by mouth every morning.     [provider]  Omega-3 Fatty Acids (FISH OIL PO) Take 1,500 mg by mouth 2 (two) times daily.    [provider]  ondansetron (ZOFRAN) 8 MG tablet Take 8 mg by mouth every 8 (eight) hours as needed for nausea or vomiting.    [provider]  OVER THE COUNTER MEDICATION Take 1-3 tablets by mouth daily as needed (for constipation). "Super Cleanse"    [provider]  oxyCODONE-acetaminophen (PERCOCET) 5-325 MG per tablet 1 to 2 tablets every 6 hours as needed for pain. 11/17/14   Tia Alert, MD  oxymorphone (OPANA ER) 15 MG 12 hr tablet Take 15 mg by mouth every 12 (twelve) hours.    [provider]  potassium chloride SA (K-DUR,KLOR-CON) 20 MEQ tablet Take 20 mEq by mouth daily.    [provider]  Probiotic CAPS Take 1 capsule by mouth 2 (two) times daily.    [provider]  Rhubarb (ESTROVERA PO) Take 1 tablet by mouth daily.    [provider]  zolpidem (AMBIEN) 10 MG tablet Take 10 mg by mouth at bedtime as needed for sleep.    [provider]    Inpatient Medications: Scheduled Meds:  Continuous Infusions:  PRN Meds:   Allergies:    Allergies  Allergen Reactions    Doxycycline Nausea Only    malaise    Erythromycin Nausea And Vomiting   Iodine    Latex Other (See Comments)    Irritation of the skin   Shellfish Allergy Other (See Comments)    GI- Facial  Acne   Strawberry Extract Other (See Comments)    Mouth ulcers    Vancomycin    Vicodin [Hydrocodone-Acetaminophen] Other (See Comments)    Restless, tolerate percocet   Dilaudid [Hydromorphone Hcl] Rash    *Pt states she can take if given benadryl prior*   Keflex [Cephalexin] Rash   Penicillins Rash    Hives  Sulfa Antibiotics Rash    Hives    Social History:   Social History   Socioeconomic History   Marital status: Married    Spouse name: Charles   Number of children: 3   Years of education: RN   Highest education level: Not on file  Occupational History    Employer: [employer]   Tobacco Use   Smoking status: Never   Smokeless tobacco: Never  Substance and Sexual Activity   Alcohol use: Yes    Alcohol/week: 6.0 standard drinks of alcohol    Types: 3 Glasses of wine, 3 Shots of liquor per week   Drug use: No   Sexual activity: Yes    Birth control/protection: Post-menopausal  Other Topics Concern   Not on file  Social History Narrative   Patient is married and lives at home with her husband Leonette Most).  Patient works full time as a [employer] fundraiser.   Right handed.   College education.   Social Determinants of Health   Financial Resource Strain: Not on file  Food Insecurity: Not on file  Transportation Needs: Not on file  Physical Activity: Not on file  Stress: Not on file  Social Connections: Not on file  Intimate Partner Violence: Not on file    Family History:    Family History  Problem Relation Age of Onset   Other Father    Hypertension Father      ROS:  Please see the history of present illness.   All other ROS reviewed and negative.     Physical Exam/Data:   Vitals:   01/21/22 1058 01/21/22 1059 01/21/22 1145 01/21/22 1309  BP:  (!) 142/87 117/78  116/82  Pulse:  76 71 71  Resp:  16 12 (!) 23  Temp:  97.7 F (36.5 C)    TempSrc:  Oral    SpO2:  100% 100% 100%  Weight: 77.6 kg     Height: 5\' 1"  (1.549 m)      No intake or output data in the 24 hours ending 01/21/22 1356    01/21/2022   10:58 AM 11/15/2014    8:12 AM 11/03/2014    1:16 PM  Last 3 Weights  Weight (lbs) 171 lb 171 lb 12 oz 171 lb 12.8 oz  Weight (kg) 77.565 kg 77.905 kg 77.928 kg     Body mass index is 32.31 kg/m.  General:  Well nourished, well developed, in no acute distress. Resting comfortably in the bed  HEENT: normal Neck: no JVD Vascular: Radial pulses 2+ bilaterally Cardiac:  normal S1, S2; RRR; no murmur  Lungs:  clear to auscultation bilaterally, no wheezing, rhonchi or rales. Normal WOB on room air Abd: soft, nontender, no hepatomegaly  Ext: no edema Musculoskeletal:  No deformities, BUE and BLE strength normal and equal Skin: warm and dry  Neuro:  CNs 2-12 intact, no focal abnormalities noted Psych:  Normal affect   EKG:  The EKG was personally reviewed and demonstrates:  normal sinus rhythm with widespread T wave inversions Telemetry:  Telemetry was personally reviewed and demonstrates:  Sinus rhythm  Relevant CV Studies:   Laboratory Data:  High Sensitivity Troponin:   Recent Labs  Lab 01/21/22 1102  TROPONINIHS 341*     ChemistryNo results for input(s): "NA", "K", "CL", "CO2", "GLUCOSE", "BUN", "CREATININE", "CALCIUM", "MG", "GFRNONAA", "GFRAA", "ANIONGAP" in the last 168 hours.  No results for input(s): "PROT", "ALBUMIN", "AST", "ALT", "ALKPHOS", "BILITOT" in the last 168 hours. Lipids No results for input(s): "  CHOL", "TRIG", "HDL", "LABVLDL", "LDLCALC", "CHOLHDL" in the last 168 hours.  HematologyNo results for input(s): "WBC", "RBC", "HGB", "HCT", "MCV", "MCH", "MCHC", "RDW", "PLT" in the last 168 hours. Thyroid No results for input(s): "TSH", "FREET4" in the last 168 hours.  BNPNo results for input(s): "BNP", "PROBNP" in the  last 168 hours.  DDimer No results for input(s): "DDIMER" in the last 168 hours.   Radiology/Studies:  MR BRAIN WO CONTRAST  Result Date: 01/21/2022 CLINICAL DATA:  TIA.  Sudden onset of aphasia, now resolved. EXAM: MRI HEAD WITHOUT CONTRAST TECHNIQUE: Multiplanar, multiecho pulse sequences of the brain and surrounding structures were obtained without intravenous contrast. COMPARISON:  None Available. FINDINGS: Brain: Small acute infarct in the left frontal Kallenbach matter. No hemorrhage, hydrocephalus, mass, or atrophy. Minimal chronic small vessel changes may be present in the pons. No prior infarct is seen. Vascular: Major flow voids are preserved Skull and upper cervical spine: Normal marrow signal. Degenerative facet spurring where covered in the cervical spine with mild C2-3 anterolisthesis. Sinuses/Orbits: Negative IMPRESSION: Small acute infarct in the left frontal Salvino matter. Electronically Signed   By: Tiburcio Pea M.D.   On: 01/21/2022 12:32     Assessment and Plan:   Troponin Elevation  EKG Changes  - Patient presented complaining of stroke-like symptoms that occurred earlier today (expressive aphasia, weakness, facial droop). Symptoms have since resolved.  - 3 days ago, patient had an episode of vertigo/nausea/vomiting associated with severe left shoulder blade pain  - Now, patient is asymptomatic in the ED  - hsTn elevated to 341>>377 - MRI brain did show a small, acute infarct in the left frontal Carfagno matter  - EKG showed sinus rhythm with widespread T wave inversions. New compared to previous EKG from 2015  - Patient reported having coronary calcium scoring done earlier this year with a calcium score of 0  - Ordered echocardiogram. Further recommendations pending echo. If there is regional wall motion abnormalities or reduced EF, consider cath. If EF/wall motion normal, may be able to proceed with coronary CT. 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  Agree with assessment and plan as noted above.  Changes made to the above note as needed.  Patient seen and independently examined with Robet Leu, PA .   We discussed all aspects of the encounter. I agree with the assessment and plan as stated above.    NSTEMI:  associated with a stroke.   Symptoms started 3 days ago.  Has diffuse TWI across her anterior , lateral and inferior leads.  She is not longer having any cp ( shoulder pain , nausea) symptoms   From a cardiac standpoint, I would like to be able to start heparin but I will yield to neuro as to when we can start heparin   She is getting an echo now.  Results/ images  pending  If she has a segmental wall motion abnormality,  I would prefer to do a cardiac cath on her.   If her overall LV function is normal, we may be able to do a coronary CT .   She had a coronary calcium score of 0 a year ago . I doubt she has significant atherosclerotic CAD.   She may have had embolic occlusion of her coronaries or perhaps  coronary artery dissection ( SCAD )    2.  Stroke:  will defer to the stroke team.   3.  Hyperlipidemia:   she has been on a statin .    4. HTN:  continue lisinopril - HCTZ     I have spent a total of 40 minutes with patient reviewing hospital  notes , telemetry, EKGs, labs and examining patient as well as establishing an assessment and plan that was  discussed with the patient.  > 50% of time was spent in direct patient care.    Vesta Mixer, Montez Hageman., MD, [facility] 01/21/2022, 3:19 PM [address]  [address] Office [phone] Pager [phone]

## 2022-01-21 NOTE — H&P (Addendum)
History and Physical    Patient: Chloe Johnson QVZ:[id number] DOB: [birth year] DOA: 01/21/2022 DOS: the patient was seen and examined on 01/21/2022 PCP: Kirby Funk, MD  Patient coming from: Home via EMS  Chief Complaint:  Chief Complaint  Patient presents with   Neurologic Problem   HPI: Chloe Johnson is a 66 y.o. right-handed female with medical history significant of hypertension, cervical radiculopathy, hepatitis, osteomyelitis/discitis, lumbar stenosis with foot drop anxiety, GERD, insomnia, who presented with sudden weakness and inability to talk.  Patient had just recently returned home from traveling to [place] 5 days ago.  She reported feeling very tired from traveling.  She works in Agricultural engineer and reported that she had been under a lot of stress recently.  3 days ago she developed acute onset of dizziness for which she tried to play down to resolve symptoms.  However, developed diarrhea and vomiting.  He had not eaten much of anything due to her feeling bad at that time.  Later on that evening around 9:30 PM she developed acute onset of left shoulder and arm numbness with complaints of throbbing in her ear.  She thought this was related to her prior neck issues.  The symptoms gradually seem to improve.  Then this morning at around 8:40 AM she had gone to feed the cat.  She notes that she was a little confused and stared at the wash machine for some time. Then had picked up plates and started to walk back into the kitchen when she dropped the plate from her right hand  and subsequently dropped the plate in her left hand.  Her husband asked what was wrong and patient knew what she wanted to say, but was unable to speak the words.  Her husband noted that she had some right-sided facial droop.    Upon admission into the emergency department patient was noted to be afebrile, mildly tachypneic, and other vital signs relatively maintained.  Labs significant for troponin 341->377.   Urinalysis without signs of infection and alcohol level undetectable.  UDS positive for marijuana and benzos.  MRI of the brain revealed a small acute infarct of the left frontal [place] matter.   Review of Systems: As mentioned in the history of present illness. All other systems reviewed and are negative. 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/16/2013   L3-4/notes 11/24/2013, arms, neck   Exertional asthma    GERD (gastroesophageal reflux disease)    Hepatitis    Hypertension    Kidney stones    "have always passed them"   Neck pain    Osteomyelitis (HCC) 11/2013   osteomyelitis, discitis    Sleep concern    uses Prozac for sleep    Past Surgical History:  Procedure Laterality Date   BREAST CYST EXCISION Left 2010   "polypectomy"   PICC LINE PLACE PERIPHERAL ([facility] HX) Right    for use of Levaquin & Vancomycin, of note: she reports that she had a "flulike feeling"    TONSILLECTOMY AND ADENOIDECTOMY  1975   TUBAL LIGATION  1999   Social History:  reports that she has never smoked. She has never used smokeless tobacco. She reports current alcohol use of about 6.0 standard drinks of alcohol per week. She reports that she does not use drugs.  Allergies  Allergen Reactions   Doxycycline Nausea Only    malaise    Erythromycin Nausea  And Vomiting   Iodine    Latex Other (See Comments)    Irritation of the skin   Shellfish Allergy Other (See Comments)    GI- Facial  Acne   Strawberry Extract Other (See Comments)    Mouth ulcers    Vancomycin    Vicodin [Hydrocodone-Acetaminophen] Other (See Comments)    Restless, tolerate percocet   Dilaudid [Hydromorphone Hcl] Rash    *Pt states she can take if given benadryl prior*   Keflex [Cephalexin] Rash   Penicillins Rash    Hives   Sulfa Antibiotics Rash    Hives    Family History  Problem Relation Age of Onset   Other Father    Hypertension Father     Prior to Admission  medications   Medication Sig Start Date End Date Taking? Authorizing Provider  cyclobenzaprine (FLEXERIL) 10 MG tablet Take 1 tablet (10 mg total) by mouth 3 (three) times daily as needed for muscle spasms. 11/17/14   Tia Alert, MD  esomeprazole (NEXIUM) 40 MG capsule Take 40 mg by mouth every morning.     [provider]  EVENING PRIMROSE OIL PO Take 1,300 mg by mouth 2 (two) times daily.    [provider]  FLUoxetine (PROZAC) 20 MG capsule Take 20 mg by mouth daily before breakfast.  10/08/13   [provider]  gabapentin (NEURONTIN) 600 MG tablet Take 1 tablet (600 mg total) by mouth 3 (three) times daily. Patient taking differently: Take 600-1,200 mg by mouth 3 (three) times daily. 600 mg in the morning, 600 mg in the afternoon, and 1200 mg at bedtime 12/02/13   Kirby Funk, MD  guaiFENesin (MUCINEX) 600 MG 12 hr tablet Take 600 mg by mouth as needed for cough or to loosen phlegm.    [provider]  lisinopril-hydrochlorothiazide (PRINZIDE,ZESTORETIC) 10-12.5 MG per tablet Take 1 tablet by mouth daily.    [provider]  loratadine (CLARITIN) 10 MG tablet Take 10 mg by mouth every morning.     [provider]  Omega-3 Fatty Acids (FISH OIL PO) Take 1,500 mg by mouth 2 (two) times daily.    [provider]  ondansetron (ZOFRAN) 8 MG tablet Take 8 mg by mouth every 8 (eight) hours as needed for nausea or vomiting.    [provider]  OVER THE COUNTER MEDICATION Take 1-3 tablets by mouth daily as needed (for constipation). "Super Cleanse"    [provider]  oxyCODONE-acetaminophen (PERCOCET) 5-325 MG per tablet 1 to 2 tablets every 6 hours as needed for pain. 11/17/14   Tia Alert, MD  oxymorphone (OPANA ER) 15 MG 12 hr tablet Take 15 mg by mouth every 12 (twelve) hours.    [provider]  potassium chloride SA (K-DUR,KLOR-CON) 20 MEQ tablet Take 20 mEq by mouth daily.    [provider]   Probiotic CAPS Take 1 capsule by mouth 2 (two) times daily.    [provider]  Rhubarb (ESTROVERA PO) Take 1 tablet by mouth daily.    [provider]  zolpidem (AMBIEN) 10 MG tablet Take 10 mg by mouth at bedtime as needed for sleep.    [provider]    Physical Exam: Vitals:   01/21/22 1309 01/21/22 1345 01/21/22 1400 01/21/22 1415  BP: 116/82 117/79 117/83 132/88  Pulse: 71 68 68 69  Resp: (!) 23 12 11 16   Temp:      TempSrc:      SpO2: 100%  100% 99% 100%  Weight:      Height:       Constitutional: Middle-aged female who appears to be in no acute distress at this time and able to follow commands Eyes: PERRL, lids and conjunctivae normal ENMT: Mucous membranes are moist.  Neck: normal, supple, no masses, no thyromegaly Respiratory: clear to auscultation bilaterally, no wheezing, no crackles. Cardiovascular: Regular rate and rhythm, no murmurs / rubs / gallops. No extremity edema. 2+ pedal pulses.  Abdomen: no tenderness, no masses palpated.  Bowel sounds positive.  Musculoskeletal:  No joint deformity upper and lower extremities. Good ROM, no contractures. Normal muscle tone.  Skin: no rashes, lesions, ulcers.   Neurologic: CN 2-12 grossly intact.  Expressive aphasia appreciated. Psychiatric: Normal judgment and insight. Alert and oriented x 3.  Anxious mood.  Data Reviewed:  EKG reveals sinus rhythm with widespread T wave inversion    Assessment and Plan: Embolic CVA Acute.  Patient presented with complaints of aphasia, right-sided facial droop weakness.  MRI noted small ischemic infarct of the left frontal [place] matter.  Patient was out of the window for tPA.  Echocardiogram revealed LV thrombus. -Admit to a telemetry bed -Stroke order set initiated -Neurochecks -Add-on lipid panel and hemoglobin A1c -Patient has been given full dose aspirin  NSTEMI Patient had reported having shoulder numbness and nausea prior to arrival, but symptoms  have since resolved.  High-sensitivity troponins 341->377. -Heparin drip per pharmacy -N.p.o. 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  Echocardiogram revealed EF  reduced at 45-50% with LV thrombus measuring 1.84 x 0.9 x 1.2 cm.    Essential hypertension Home blood pressure regimen includes lisinopril-hydrochlorothiazide -Allowing for permissive hypertension at this time  Hyperlipidemia Patient had been on a statin previously, but was discontinued after calcium score was 0 back in 09/2020. -Goal LDL less than 70 -Consider resuming statin if not at goal  Anxiety insomnia -Continue alprazolam as needed -Triazolam not on formulary -Melatonin offered  Lumbar stenosis Patient has been following with Dr. Yetta Barre of orthopedics.  Issues with regards to lumbar stenosis for which he developed left foot drop.  Plans have been made for the patient to possibly undergo surgery. -Continue outpatient follow-up  Marijuana use Patient uses marijuana for issues with anxiety  Advance Care Planning:   Code Status: Full Code   Consults: Neurology, cardiology  Family Communication: Daughter updated at bedside  Severity of Illness: The appropriate patient status for this patient is INPATIENT. Inpatient status is judged to be reasonable and necessary in order to provide the required intensity of service to ensure the patient's safety. The patient's presenting symptoms, physical exam findings, and initial radiographic and laboratory data in the context of their chronic comorbidities is felt to place them at high risk for further clinical deterioration. Furthermore, it is not anticipated that the patient will be medically stable for discharge from the hospital within 2 midnights of admission.   * I certify that at the point of admission it is my  clinical judgment that the patient will require inpatient hospital care spanning beyond 2 midnights from the point of admission due to high intensity of service, high risk for further deterioration and high frequency of surveillance required.*  Author: Clydie Braun, MD 01/21/2022 4:12 PM  For on call review [email or web address].

## 2022-01-22 ENCOUNTER — Other Ambulatory Visit (HOSPITAL_COMMUNITY): Payer: Self-pay

## 2022-01-22 ENCOUNTER — Encounter (HOSPITAL_COMMUNITY)
Admission: EM | Disposition: A | Discharge status: Home / Self Care | Payer: Self-pay | Source: Home / Self Care | Attending: Neurology

## 2022-01-22 ENCOUNTER — Encounter (HOSPITAL_COMMUNITY): Payer: Self-pay | Admitting: Interventional Radiology

## 2022-01-22 ENCOUNTER — Inpatient Hospital Stay (HOSPITAL_COMMUNITY): Payer: Federal, State, Local not specified - PPO

## 2022-01-22 ENCOUNTER — Telehealth (HOSPITAL_COMMUNITY): Payer: Self-pay | Admitting: Pharmacy Technician

## 2022-01-22 DIAGNOSIS — I63412 Cerebral infarction due to embolism of left middle cerebral artery: Secondary | ICD-10-CM | POA: Diagnosis not present

## 2022-01-22 DIAGNOSIS — I639 Cerebral infarction, unspecified: Secondary | ICD-10-CM | POA: Diagnosis not present

## 2022-01-22 HISTORY — PX: IR US GUIDE VASC ACCESS RIGHT: IMG2390

## 2022-01-22 LAB — LIPID PANEL
Cholesterol: 223 mg/dL — ABNORMAL HIGH (ref 0–200)
HDL: 40 mg/dL — ABNORMAL LOW (ref >40)
LDL Cholesterol: 150 mg/dL — ABNORMAL HIGH (ref 0–99)
Total CHOL/HDL Ratio: 5.6 RATIO
Triglycerides: 164 mg/dL — ABNORMAL HIGH (ref <150)
VLDL: 33 mg/dL (ref 0–40)

## 2022-01-22 LAB — HEMOGLOBIN A1C
Hgb A1c MFr Bld: 5 % (ref 4.8–5.6)
Mean Plasma Glucose: 96.8 mg/dL

## 2022-01-22 LAB — CBC
HCT: 35.8 % — ABNORMAL LOW (ref 36.0–46.0)
Hemoglobin: 12 g/dL (ref 12.0–15.0)
MCH: 29.2 pg (ref 26.0–34.0)
MCHC: 33.5 g/dL (ref 30.0–36.0)
MCV: 87.1 fL (ref 80.0–100.0)
Platelets: 187 10*3/uL (ref 150–400)
RBC: 4.11 MIL/uL (ref 3.87–5.11)
RDW: 11.9 % (ref 11.5–15.5)
WBC: 4.9 10*3/uL (ref 4.0–10.5)
nRBC: 0 % (ref 0.0–0.2)

## 2022-01-22 LAB — TROPONIN I (HIGH SENSITIVITY): Troponin I (High Sensitivity): 235 ng/L (ref <18)

## 2022-01-22 LAB — BASIC METABOLIC PANEL
Anion gap: 12 (ref 5–15)
BUN: 15 mg/dL (ref 8–23)
CO2: 20 mmol/L — ABNORMAL LOW (ref 22–32)
Calcium: 8.6 mg/dL — ABNORMAL LOW (ref 8.9–10.3)
Chloride: 107 mmol/L (ref 98–111)
Creatinine, Ser: 0.87 mg/dL (ref 0.44–1.00)
GFR, Estimated: >60 mL/min (ref >60)
Glucose, Bld: 113 mg/dL — ABNORMAL HIGH (ref 70–99)
Potassium: 4.3 mmol/L (ref 3.5–5.1)
Sodium: 139 mmol/L (ref 135–145)

## 2022-01-22 LAB — HEPARIN LEVEL (UNFRACTIONATED)
Heparin Unfractionated: <0.1 IU/mL — ABNORMAL LOW (ref 0.30–0.70)
Heparin Unfractionated: 0.3 IU/mL (ref 0.30–0.70)

## 2022-01-22 LAB — HIV ANTIBODY (ROUTINE TESTING W REFLEX): HIV Screen 4th Generation wRfx: NONREACTIVE

## 2022-01-22 SURGERY — LEFT HEART CATH AND CORONARY ANGIOGRAPHY
Anesthesia: LOCAL

## 2022-01-22 MED ORDER — HEPARIN (PORCINE) 25000 UT/250ML-% IV SOLN
850.0000 [IU]/h | INTRAVENOUS | Status: DC
  Administered 2022-01-22: 850 [IU]/h via INTRAVENOUS
  Administered 2022-01-23: 900 [IU]/h via INTRAVENOUS
  Filled 2022-01-22 (×2): qty 250

## 2022-01-22 MED ORDER — ROSUVASTATIN CALCIUM 20 MG PO TABS
40.0000 mg | ORAL_TABLET | Freq: Every day | ORAL | Status: DC
  Administered 2022-01-22 – 2022-01-23 (×2): 40 mg via ORAL
  Filled 2022-01-22 (×2): qty 2

## 2022-01-22 MED ORDER — IVABRADINE HCL 7.5 MG PO TABS
15.0000 mg | ORAL_TABLET | Freq: Once | ORAL | Status: AC
  Administered 2022-01-23: 15 mg via ORAL
  Filled 2022-01-22: qty 2

## 2022-01-22 MED ORDER — ASPIRIN 81 MG PO CHEW
81.0000 mg | CHEWABLE_TABLET | Freq: Every day | ORAL | Status: DC
Start: 2022-01-22 — End: 2022-01-23
  Administered 2022-01-22 – 2022-01-23 (×2): 81 mg via ORAL
  Filled 2022-01-22 (×2): qty 1

## 2022-01-22 MED ORDER — ORAL CARE MOUTH RINSE: 15.0000 mL | OROMUCOSAL | Status: DC | PRN

## 2022-01-22 MED ORDER — SODIUM CHLORIDE 0.9 % IV BOLUS
500.0000 mL | Freq: Once | INTRAVENOUS | Status: AC
Start: 2022-01-22 — End: 2022-01-22
  Administered 2022-01-22: 500 mL via INTRAVENOUS

## 2022-01-22 NOTE — Telephone Encounter (Signed)
Pharmacy Patient Advocate Encounter  Insurance verification completed.    The patient is insured through [employer]   The patient is currently admitted and ran test claims for the following: Eliquis.  Copays and coinsurance results were relayed to Inpatient clinical team.  

## 2022-01-22 NOTE — Progress Notes (Signed)
Notified Dr. Derry Lory of pt's SBP being less than 120.  At 0100 pt's BP was 115/75 and at 0200 it was 107/71.  MD okay with SBP being less than 120 as along as it does not drop too low.

## 2022-01-22 NOTE — Progress Notes (Signed)
ANTICOAGULATION CONSULT NOTE - Initial Consult  Pharmacy Consult for Heparin infusion Indication: stroke  Allergies  Allergen Reactions   Doxycycline Nausea Only    malaise    Erythromycin Nausea And Vomiting   Iodine    Latex Other (See Comments)    Irritation of the skin   Shellfish Allergy Other (See Comments)    GI- Facial  Acne   Strawberry Extract Other (See Comments)    Mouth ulcers    Vancomycin    Vicodin [Hydrocodone-Acetaminophen] Other (See Comments)    Restless, tolerate percocet   Dilaudid [Hydromorphone Hcl] Rash    *Pt states she can take if given benadryl prior*   Keflex [Cephalexin] Rash   Penicillins Rash    Hives   Sulfa Antibiotics Rash    Hives    Patient Measurements: Height: 5\' 1"  (154.9 cm) Weight: 63.8 kg (140 lb 10.5 oz) IBW/kg (Calculated) : 47.8 Heparin Dosing Weight: 65.1 kg  Vital Signs: Temp: 98 F (36.7 C) (08/30 0800) Temp Source: Oral (08/30 0800) BP: 96/66 (08/30 0800) Pulse Rate: 66 (08/30 0800)  Labs: Recent Labs    01/21/22 1102 01/21/22 1309 01/21/22 1445 01/22/22 0351  HGB  --   --  12.5 12.0  HCT  --   --  37.2 35.8*  PLT  --   --  175 187  LABPROT  --   --  13.8  --   INR  --   --  1.1  --   HEPARINUNFRC  --   --   --  <0.10*  CREATININE  --   --  0.82  --   TROPONINIHS 341* 377*  --   --      Estimated Creatinine Clearance: 59.3 mL/min (by C-G formula based on SCr of 0.82 mg/dL).   Medical History: 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/16/2013   L3-4/notes 11/24/2013, arms, neck   Exertional asthma    GERD (gastroesophageal reflux disease)    Hepatitis    Hypertension    Kidney stones    "have always passed them"   Neck pain    Osteomyelitis (HCC) 11/2013   osteomyelitis, discitis    Sleep concern    uses Prozac for sleep     Medications:  Medications Prior to Admission  Medication Sig Dispense Refill Last Dose   ALPRAZolam (XANAX) 0.5  MG tablet Take 0.5 mg by mouth at bedtime.   01/20/2022   guaiFENesin (MUCINEX) 600 MG 12 hr tablet Take 600 mg by mouth as needed for cough or to loosen phlegm.   Past Week   lisinopril-hydrochlorothiazide (PRINZIDE,ZESTORETIC) 10-12.5 MG per tablet Take 1 tablet by mouth daily.   01/21/2022   loratadine (CLARITIN) 10 MG tablet Take 10 mg by mouth every morning.   01/21/2022   meloxicam (MOBIC) 7.5 MG tablet Take 7.5 mg by mouth in the morning and at bedtime.   01/21/2022   MYRBETRIQ 25 MG TB24 tablet Take 25 mg by mouth daily.   01/21/2022   potassium chloride SA (K-DUR,KLOR-CON) 20 MEQ tablet Take 20 mEq by mouth daily.   01/21/2022   triazolam (HALCION) 0.25 MG tablet Take 0.25 mg by mouth at bedtime.   01/20/2022   cyclobenzaprine (FLEXERIL) 10 MG tablet Take 1 tablet (10 mg total) by mouth 3 (three) times daily as needed for muscle spasms. 90 tablet 5    oxyCODONE-acetaminophen (PERCOCET) 5-325 MG per tablet 1  to 2 tablets every 6 hours as needed for pain. 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 Patient does not take any anticoagulants at home. Pharmacy consulted to dose heparin infusion.    Goal of Therapy:  Heparin level 0.3-0.5 units/ml Monitor platelets by anticoagulation protocol: Yes   Plan:  No bolus per stroke protocol Initiate heparin infusion at 850 units/hr  Check heparin level in 6 hours @ 1600 Monitor daily CBC, heparin level, and for s/sx of bleeding  Calton Dach, PharmD Clinical Pharmacist 01/22/2022 8:48 AM

## 2022-01-22 NOTE — Evaluation (Signed)
Occupational Therapy Evaluation Patient Details Name: Chloe Johnson MRN: [id number] DOB: 20-Apr-1956 Today's Date: 01/22/2022   History of Present Illness Chloe Johnson is a 66 y.o. who presented with sudden weakness and inability to talk. Small acute infarct in the left frontal [place] matter. Past medical history significant of hypertension, cervical radiculopathy, hepatitis, osteomyelitis/discitis, lumbar stenosis with foot drop anxiety, GERD, insomnia   Clinical Impression   Chloe Johnson was evaluated s/p the above admission list, she is indep at baseline including working and driving. She lives with her husband who is able to assist as needed at d/c. Upon evaluation pt demonstrate mod I ability to complete ADLs and mobility. Pt endorses mild word finding difficultly and was noted to have slowed processing and impaired delayed recall, otherwise her cognition was [facility]. OT to continue to follow acute to address higher level cognition. Recommend d/c home with support of family, no OT follow up.      Recommendations for follow up therapy are one component of a multi-disciplinary discharge planning process, led by the attending physician.  Recommendations may be updated based on patient status, additional functional criteria and insurance authorization.   Follow Up Recommendations  No OT follow up    Assistance Recommended at Discharge PRN  Patient can return home with the following Direct supervision/assist for medications management;Direct supervision/assist for financial management;Assist for transportation    Functional Status Assessment  Patient has had a recent decline in their functional status and demonstrates the ability to make significant improvements in function in a reasonable and predictable amount of time.  Equipment Recommendations  None recommended by OT    Recommendations for Other Services       Precautions / Restrictions Precautions Precautions: Fall Restrictions Weight Bearing  Restrictions: No      Mobility Bed Mobility Overal bed mobility: Needs Assistance Bed Mobility: Supine to Sit     Supine to sit: Modified independent (Device/Increase time)     General bed mobility comments: HOB elevated    Transfers Overall transfer level: Modified independent Equipment used: None                      Balance Overall balance assessment: Modified Independent                                         ADL either performed or assessed with clinical judgement   ADL Overall ADL's : Modified independent                                       General ADL Comments: Supervision provided throughout however demonstrated mod I ability. no LOB with dual tasking, head turns, or sudden movements     Vision Baseline Vision/History: 1 Wears glasses Vision Assessment?: No apparent visual deficits     Perception     Praxis      Pertinent Vitals/Pain Pain Assessment Pain Assessment: Faces Faces Pain Scale: No hurt Pain Intervention(s): Monitored during session     Hand Dominance Right   Extremity/Trunk Assessment Upper Extremity Assessment Upper Extremity Assessment: Overall WFL for tasks assessed   Lower Extremity Assessment Lower Extremity Assessment: Overall WFL for tasks assessed   Cervical / Trunk Assessment Cervical / Trunk Assessment: Kyphotic;Normal   Communication Communication Communication: Expressive difficulties (mild word finding difficulties)  Cognition Arousal/Alertness: Awake/alert Behavior During Therapy: WFL for tasks assessed/performed Overall Cognitive Status: Impaired/Different from baseline Area of Impairment: Memory, Problem solving                     Memory: Decreased short-term memory       Problem Solving: Slow processing General Comments: overall WFL for functional tasks assessed, slowed processing likely due to word finding impairments. poor delayed recall only getting  1/3, unable to state the other two words with contextual cues     General Comments  VSS on RA, pt reports feeling mildyl weak and dizzy. Husband present and supportive    Exercises     Shoulder Instructions      Home Living Family/patient expects to be discharged to:: Private residence Living Arrangements: Spouse/significant other Available Help at Discharge: Available 24 hours/day Type of Home: House Home Access: Stairs to enter [employer] of Steps: 5 from garage, 3+2 at [employer]: Left Home Layout: Multi-level;Bed/bath upstairs Alternate Level Stairs-Number of Steps: flight Alternate Level Stairs-Rails: Right Bathroom Shower/Tub: Arts development officer Toilet: Handicapped height     Home Equipment: Agricultural consultant (2 wheels);Rollator (4 wheels);Cane - single point;Shower seat   Additional Comments: Husband works, able to take time off initially if needed      Prior Functioning/Environment Prior Level of Function : Independent/Modified Independent             Mobility Comments: no AD ADLs Comments: works, drives        OT Problem List: Decreased cognition      OT Treatment/Interventions: Self-care/ADL training;Therapeutic exercise;Neuromuscular education;Cognitive remediation/compensation    OT Goals(Current goals can be found in the care plan section) Acute Rehab OT Goals Patient Stated Goal: home OT Goal Formulation: With patient Time For Goal Achievement: 02/05/22 Potential to Achieve Goals: Good ADL Goals Additional ADL Goal #1: Pt will indep complete medication management task  OT Frequency: Min 2X/week    Co-evaluation PT/OT/SLP Co-Evaluation/Treatment: Yes Reason for Co-Treatment: For patient/therapist safety;To address functional/ADL transfers   OT goals addressed during session: ADL's and self-care      AM-PAC OT "6 Clicks" Daily Activity     Outcome Measure Help from another person eating meals?: None Help  from another person taking care of personal grooming?: None Help from another person toileting, which includes using toliet, bedpan, or urinal?: None Help from another person bathing (including washing, rinsing, drying)?: None Help from another person to put on and taking off regular upper body clothing?: None Help from another person to put on and taking off regular lower body clothing?: None 6 Click Score: 24   End of Session Nurse Communication: Mobility status (indep ambulator)  Activity Tolerance: Patient tolerated treatment well Patient left: in chair;with call bell/phone within reach;with chair alarm set  OT Visit Diagnosis: Unsteadiness on feet (R26.81);Cognitive communication deficit (R41.841) Symptoms and signs involving cognitive functions: Cerebral infarction                Time: 2505-3976 OT Time Calculation (min): 26 min Charges:  OT General Charges $OT Visit: 1 Visit OT Evaluation $OT Eval Moderate Complexity: 1 Mod    Galia Rahm A Miken Stecher 01/22/2022, 11:10 AM

## 2022-01-22 NOTE — Progress Notes (Addendum)
Pt became hypotensive.  BP 88/60 (69) and 83/49 (61) upon retake.  Dr. Derry Lory paged.  VO given for 500 mL NS bolus.

## 2022-01-22 NOTE — Progress Notes (Signed)
ANTICOAGULATION CONSULT NOTE - Initial Consult  Pharmacy Consult for Heparin infusion Indication: stroke  Allergies  Allergen Reactions   Doxycycline Nausea Only    malaise    Erythromycin Nausea And Vomiting   Iodine    Latex Other (See Comments)    Irritation of the skin   Shellfish Allergy Other (See Comments)    GI- Facial  Acne   Strawberry Extract Other (See Comments)    Mouth ulcers    Vancomycin    Vicodin [Hydrocodone-Acetaminophen] Other (See Comments)    Restless, tolerate percocet   Dilaudid [Hydromorphone Hcl] Rash    *Pt states she can take if given benadryl prior*   Keflex [Cephalexin] Rash   Penicillins Rash    Hives   Sulfa Antibiotics Rash    Hives    Patient Measurements: Height: 5\' 1"  (154.9 cm) Weight: 63.8 kg (140 lb 10.5 oz) IBW/kg (Calculated) : 47.8 Heparin Dosing Weight: 65.1 kg  Vital Signs: Temp: 98.2 F (36.8 C) (08/30 1600) Temp Source: Oral (08/30 1600) BP: 86/75 (08/30 1500) Pulse Rate: 79 (08/30 1500)  Labs: Recent Labs    01/21/22 1102 01/21/22 1309 01/21/22 1445 01/22/22 0351 01/22/22 0827 01/22/22 1659  HGB  --   --  12.5 12.0  --   --   HCT  --   --  37.2 35.8*  --   --   PLT  --   --  175 187  --   --   LABPROT  --   --  13.8  --   --   --   INR  --   --  1.1  --   --   --   HEPARINUNFRC  --   --   --  <0.10*  --  0.30  CREATININE  --   --  0.82  --  0.87  --   TROPONINIHS 341* 377*  --   --  235*  --      Estimated Creatinine Clearance: 55.9 mL/min (by C-G formula based on SCr of 0.87 mg/dL).   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   01/20/2022   guaiFENesin (MUCINEX) 600 MG 12 hr tablet Take 600 mg by mouth as needed for cough or to loosen phlegm.   Past Week   lisinopril-hydrochlorothiazide (PRINZIDE,ZESTORETIC) 10-12.5 MG per tablet Take 1 tablet by mouth daily.   01/21/2022   loratadine (CLARITIN) 10 MG tablet Take 10 mg by mouth every morning.   01/21/2022   meloxicam (MOBIC) 7.5 MG tablet Take 7.5 mg by mouth in the morning and at bedtime.   01/21/2022   MYRBETRIQ 25 MG TB24 tablet Take 25 mg by mouth daily.   01/21/2022   potassium chloride SA (K-DUR,KLOR-CON) 20 MEQ tablet Take 20 mEq by mouth daily.   01/21/2022   triazolam (HALCION) 0.25 MG tablet Take  0.25 mg by mouth at bedtime.   01/20/2022   cyclobenzaprine (FLEXERIL) 10 MG tablet Take 1 tablet (10 mg total) by mouth 3 (three) times daily as needed for muscle spasms. 90 tablet 5    oxyCODONE-acetaminophen (PERCOCET) 5-325 MG per tablet 1 to 2 tablets every 6 hours as needed for pain. 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 Patient does not take any anticoagulants at home. Pharmacy consulted to dose heparin infusion.   PM update: HL 0.3, continue current rate, HL in 6 hours, no bleeding noted   Goal of Therapy:  Heparin level 0.3-0.5 units/ml Monitor platelets by anticoagulation protocol: Yes   Plan:  No bolus per stroke protocol Continue heparin infusion at 850 units/hr  Check heparin level in 6 hours @ 0000 Monitor daily CBC, heparin level, and for s/sx of bleeding  Willys Salvino A. Jeanella Craze, PharmD, BCPS, FNKF Clinical Pharmacist [place] Please utilize Amion for appropriate phone number to reach the unit pharmacist [facility])  01/22/2022 6:02 PM

## 2022-01-22 NOTE — Evaluation (Signed)
Physical Therapy Evaluation & Discharge Patient Details Name: Chloe Johnson MRN: [id number] DOB: [birth year] Today's Date: 01/22/2022  History of Present Illness  Chloe Johnson is a 66 y.o. who presented 01/21/22 with sudden weakness and inability to talk. Small acute infarct in the left frontal Donatelli matter. Past medical history significant of hypertension, cervical radiculopathy, hepatitis, osteomyelitis/discitis, lumbar stenosis with foot drop anxiety, GERD, insomnia   Clinical Impression  Pt presents with condition above. PTA, she was IND without DME, living with her husband in a 2-level house with 5 STE. Currently, pt reports some mild weakness and dizziness along with some mild word-finding difficulty. However, she does not display any lower extremity asymmetry or deficits in strength, sensation, or coordination with formal testing or functional mobility. She is IND with all functional mobility, displaying intact balance, supported by her DGI score of 24/24 today. As pt is at her baseline functionally and all education completed and questions answered, PT will sign off.     Recommendations for follow up therapy are one component of a multi-disciplinary discharge planning process, led by the attending physician.  Recommendations may be updated based on patient status, additional functional criteria and insurance authorization.  Follow Up Recommendations No PT follow up      Assistance Recommended at Discharge PRN  Patient can return home with the following  Assist for transportation;Direct supervision/assist for medications management;Direct supervision/assist for financial management    Equipment Recommendations None recommended by PT  Recommendations for Other Services       Functional Status Assessment Patient has not had a recent decline in their functional status     Precautions / Restrictions Precautions Precautions: None Restrictions Weight Bearing Restrictions: No       Mobility  Bed Mobility Overal bed mobility: Modified Independent Bed Mobility: Supine to Sit     Supine to sit: Modified independent (Device/Increase time)     General bed mobility comments: HOB elevated, no assistance needed    Transfers Overall transfer level: Independent Equipment used: None               General transfer comment: Able to come to stand without assistance or LOB    Ambulation/Gait Ambulation/Gait assistance: Supervision, Independent Gait Distance (Feet): 350 Feet Assistive device: None Gait Pattern/deviations: WFL(Within Functional Limits) Gait velocity: WNL Gait velocity interpretation: >4.37 ft/sec, indicative of normal walking speed   General Gait Details: Pt able to withstand DGI challenges without LOB or significant gait deviations noted, progressing from supervision initially for safety to IND.  Stairs Stairs: Yes Stairs assistance: Supervision Stair Management: No rails, Alternating pattern, Forwards Number of Stairs: 10 General stair comments: Ascends and descends stairs without LOB, supervision for safety  Wheelchair Mobility    Modified Rankin (Stroke Patients Only) Modified Rankin (Stroke Patients Only) Pre-Morbid Rankin Score: No symptoms Modified Rankin: No significant disability     Balance Overall balance assessment: Modified Independent                               Standardized Balance Assessment Standardized Balance Assessment : Dynamic Gait Index   Dynamic Gait Index Level Surface: Normal Change in Gait Speed: Normal Gait with Horizontal Head Turns: Normal Gait with Vertical Head Turns: Normal Gait and Pivot Turn: Normal Step Over Obstacle: Normal Step Around Obstacles: Normal Steps: Normal Total Score: 24       Pertinent Vitals/Pain Pain Assessment Pain Assessment: Faces Faces Pain Scale: No hurt  Pain Intervention(s): Monitored during session    Home Living Family/patient expects to  be discharged to:: Private residence Living Arrangements: Spouse/significant other Available Help at Discharge: Available 24 hours/day Type of Home: House Home Access: Stairs to enter Entrance Stairs-Rails: Left Entrance Stairs-Number of Steps: 5 from garage, 3+2 at porch Alternate Level Stairs-Number of Steps: flight Home Layout: Multi-level;Bed/bath upstairs Home Equipment: Agricultural consultant (2 wheels);Rollator (4 wheels);Cane - single point;Shower seat Additional Comments: Husband works, able to take time off initially if needed    Prior Function Prior Level of Function : Independent/Modified Independent             Mobility Comments: no AD ADLs Comments: works, drives     Higher education careers adviser   Dominant Hand: Right    Extremity/Trunk Assessment   Upper Extremity Assessment Upper Extremity Assessment: Defer to OT evaluation    Lower Extremity Assessment Lower Extremity Assessment: Overall WFL for tasks assessed (MMT scores of 4+ to 5 grossly bil, appearing symmetrical; sensation and coordination intact bil)    Cervical / Trunk Assessment Cervical / Trunk Assessment: Kyphotic  Communication   Communication: Expressive difficulties (mild word finding difficulties)  Cognition Arousal/Alertness: Awake/alert Behavior During Therapy: WFL for tasks assessed/performed Overall Cognitive Status: Impaired/Different from baseline Area of Impairment: Memory, Problem solving                     Memory: Decreased short-term memory       Problem Solving: Slow processing General Comments: overall WFL for functional tasks assessed, slowed processing likely due to word finding impairments. poor delayed recall only getting 1/3, unable to state the other two words with contextual cues        General Comments General comments (skin integrity, edema, etc.): VSS on RA, pt reports feeling mildly weak and dizzy. Husband present and supportive    Exercises     Assessment/Plan     PT Assessment Patient does not need any further PT services  PT Problem List         PT Treatment Interventions      PT Goals (Current goals can be found in the Care Plan section)  Acute Rehab PT Goals Patient Stated Goal: to get back to normal PT Goal Formulation: All assessment and education complete, DC therapy Time For Goal Achievement: 01/23/22 Potential to Achieve Goals: Good    Frequency       Co-evaluation PT/OT/SLP Co-Evaluation/Treatment: Yes Reason for Co-Treatment: For patient/therapist safety;To address functional/ADL transfers PT goals addressed during session: Mobility/safety with mobility;Balance         AM-PAC PT "6 Clicks" Mobility  Outcome Measure Help needed turning from your back to your side while in a flat bed without using bedrails?: None Help needed moving from lying on your back to sitting on the side of a flat bed without using bedrails?: None Help needed moving to and from a bed to a chair (including a wheelchair)?: None Help needed standing up from a chair using your arms (e.g., wheelchair or bedside chair)?: None Help needed to walk in hospital room?: None Help needed climbing 3-5 steps with a railing? : None 6 Click Score: 24    End of Session   Activity Tolerance: Patient tolerated treatment well Patient left: in chair;with call bell/phone within reach;with chair alarm set;with family/visitor present Nurse Communication: Mobility status PT Visit Diagnosis: Other symptoms and signs involving the nervous system (R29.898)    Time: 6387-5643 PT Time Calculation (min) (ACUTE ONLY): 26 min  Charges:   PT Evaluation $PT Eval Low Complexity: 1 Low          Raymond Gurney, PT, DPT Acute Rehabilitation Services  Office: [phone]   [name] 01/22/2022, 12:14 PM

## 2022-01-22 NOTE — Progress Notes (Addendum)
STROKE TEAM PROGRESS NOTE   INTERVAL HISTORY Her husband is at the bedside.  Discussed with Dr. Elease Hashimoto- Continue heparin, plan for coronary CT tomorrow.  Repeat brain MRI today shows new punctate acute infarcts in the left external capsule, insula, and frontal lobe cortex, and unchanged additional punctate infarct in the left frontal lobe Uptain matter. Troponin 377 - 235 No PT or OT follow up recommended  Vitals:   01/22/22 0505 01/22/22 0600 01/22/22 0643 01/22/22 0700  BP: (!) 83/49 (!) 92/57 102/72 102/66  Pulse: 72 73 70 72  Resp: (!) 21 19 14 17   Temp:      TempSrc:      SpO2: 94% 99% 95% 97%  Weight:      Height:       CBC:  Recent Labs  Lab 01/21/22 1445 01/22/22 0351  WBC 4.9 4.9  NEUTROABS 2.9  --   HGB 12.5 12.0  HCT 37.2 35.8*  MCV 85.5 87.1  PLT 175 187   Basic Metabolic Panel:  Recent Labs  Lab 01/21/22 1445  NA 138  K 3.7  CL 105  CO2 22  GLUCOSE 94  BUN 18  CREATININE 0.82  CALCIUM 10.0  MG 2.1   Lipid Panel:  Recent Labs  Lab 01/22/22 0351  CHOL 223*  TRIG 164*  HDL 40*  CHOLHDL 5.6  VLDL 33  LDLCALC 01/24/22*   HgbA1c:  Recent Labs  Lab 01/22/22 0351  HGBA1C 5.0   Urine Drug Screen:  Recent Labs  Lab 01/21/22 1138  LABOPIA NONE DETECTED  COCAINSCRNUR NONE DETECTED  LABBENZ POSITIVE*  AMPHETMU NONE DETECTED  THCU POSITIVE*  LABBARB NONE DETECTED    Alcohol Level  Recent Labs  Lab 01/21/22 1445  ETH <10    IMAGING past 24 hours CT CEREBRAL PERFUSION W CONTRAST  Result Date: 01/21/2022 CLINICAL DATA:  Neuro deficit, acute, stroke suspected. EXAM: CT ANGIOGRAPHY HEAD AND NECK CT PERFUSION BRAIN TECHNIQUE: Multidetector CT imaging of the head and neck was performed using the standard protocol during bolus administration of intravenous contrast. Multiplanar CT image reconstructions and MIPs were obtained to evaluate the vascular anatomy. Carotid stenosis measurements (when applicable) are obtained utilizing NASCET criteria,  using the distal internal carotid diameter as the denominator. Multiphase CT imaging of the brain was performed following IV bolus contrast injection. Subsequent parametric perfusion maps were calculated using RAPID software. RADIATION DOSE REDUCTION: This exam was performed according to the departmental dose-optimization program which includes automated exposure control, adjustment of the mA and/or kV according to patient size and/or use of iterative reconstruction technique. CONTRAST:  01/23/2022 OMNIPAQUE IOHEXOL 350 MG/ML SOLN COMPARISON:  Brain MRI 01/21/2022. Noncontrast head CT performed earlier today 01/21/2022. FINDINGS: CTA NECK FINDINGS Aortic arch: Standard aortic branching. Mild atherosclerotic plaque within the visualized aortic arch and proximal major branch vessels of the neck. Streak and beam hardening artifact arising from a dense right-sided contrast bolus partially obscures the right subclavian artery. Within this limitation, there is no appreciable hemodynamically significant innominate or proximal subclavian artery stenosis. Right carotid system: CCA and ICA patent within the neck without stenosis. Mild atherosclerotic plaque about the carotid bifurcation. Left carotid system: CCA and ICA patent within the neck without stenosis. Minimal atherosclerotic plaque about the carotid bifurcation. Vertebral arteries: Vertebral arteries codominant and patent within the neck without stenosis. Nonstenotic atherosclerotic plaque at the origin of the right vertebral artery. Skeleton: Cervical spondylosis. No acute fracture or aggressive osseous lesion. Other neck: No neck mass or cervical lymphadenopathy.  Upper chest: No consolidation within the imaged lung apices. Review of the MIP images confirms the above findings CTA HEAD FINDINGS Anterior circulation: The intracranial internal carotid arteries are patent. The M1 middle cerebral arteries are patent. Occluded mid M2 left MCA vessel (with distal  reconstitution, likely due to collateral flow) (for instance as seen on series 15, image 15). The anterior cerebral arteries are patent. No intracranial aneurysm is identified. Posterior circulation: The intracranial vertebral arteries are patent. The basilar artery is patent. The posterior cerebral arteries are patent. Posterior communicating arteries are diminutive or absent, bilaterally. Venous sinuses: Within the limitations of contrast timing, no convincing thrombus. Anatomic variants: As described. Review of the MIP images confirms the above findings CT Brain Perfusion Findings: CBF (<30%) Volume: 74mL Perfusion (Tmax>6.0s) volume: 59mL Mismatch Volume: 54mL Infarction Location:None identified CTA head impression and CT perfusion head impression called by telephone at the time of interpretation on 01/21/2022 at 6:50 pm to provider MCNEILL [facility] , who verbally acknowledged these results. IMPRESSION: CTA neck: 1. The common carotid, internal carotid and vertebral arteries are patent within the neck without stenosis. Minimal non-stenotic atherosclerotic plaque about the carotid bifurcations and at the origin of the right vertebral artery. 2.  Aortic Atherosclerosis (ICD10-I70.0). CTA head: Occluded mid M2 left MCA vessel (with distal reconstitution, likely due to collateral flow). CT perfusion head: The perfusion software identifies a 7 mL region of critically hypoperfused parenchyma within the left insula/frontal lobe (utilizing the Tmax>6 seconds threshold). No core infarct is identified. Reported mismatch volume: 7 mL. Electronically Signed   By: Jackey Loge D.O.   On: 01/21/2022 19:09   CT ANGIO HEAD NECK W WO CM (CODE STROKE)  Result Date: 01/21/2022 CLINICAL DATA:  Neuro deficit, acute, stroke suspected. EXAM: CT ANGIOGRAPHY HEAD AND NECK CT PERFUSION BRAIN TECHNIQUE: Multidetector CT imaging of the head and neck was performed using the standard protocol during bolus administration of intravenous  contrast. Multiplanar CT image reconstructions and MIPs were obtained to evaluate the vascular anatomy. Carotid stenosis measurements (when applicable) are obtained utilizing NASCET criteria, using the distal internal carotid diameter as the denominator. Multiphase CT imaging of the brain was performed following IV bolus contrast injection. Subsequent parametric perfusion maps were calculated using RAPID software. RADIATION DOSE REDUCTION: This exam was performed according to the departmental dose-optimization program which includes automated exposure control, adjustment of the mA and/or kV according to patient size and/or use of iterative reconstruction technique. CONTRAST:  OMNIPAQUE IOHEXOL 350 MG/ML SOLN COMPARISON:  Brain MRI 01/21/2022. Noncontrast head CT performed earlier today 01/21/2022. FINDINGS: CTA NECK FINDINGS Aortic arch: Standard aortic branching. Mild atherosclerotic plaque within the visualized aortic arch and proximal major branch vessels of the neck. Streak and beam hardening artifact arising from a dense right-sided contrast bolus partially obscures the right subclavian artery. Within this limitation, there is no appreciable hemodynamically significant innominate or proximal subclavian artery stenosis. Right carotid system: CCA and ICA patent within the neck without stenosis. Mild atherosclerotic plaque about the carotid bifurcation. Left carotid system: CCA and ICA patent within the neck without stenosis. Minimal atherosclerotic plaque about the carotid bifurcation. Vertebral arteries: Vertebral arteries codominant and patent within the neck without stenosis. Nonstenotic atherosclerotic plaque at the origin of the right vertebral artery. Skeleton: Cervical spondylosis. No acute fracture or aggressive osseous lesion. Other neck: No neck mass or cervical lymphadenopathy. Upper chest: No consolidation within the imaged lung apices. Review of the MIP images confirms the above findings CTA  HEAD FINDINGS Anterior  circulation: The intracranial internal carotid arteries are patent. The M1 middle cerebral arteries are patent. Occluded mid M2 left MCA vessel (with distal reconstitution, likely due to collateral flow) (for instance as seen on series 15, image 15). The anterior cerebral arteries are patent. No intracranial aneurysm is identified. Posterior circulation: The intracranial vertebral arteries are patent. The basilar artery is patent. The posterior cerebral arteries are patent. Posterior communicating arteries are diminutive or absent, bilaterally. Venous sinuses: Within the limitations of contrast timing, no convincing thrombus. Anatomic variants: As described. Review of the MIP images confirms the above findings CT Brain Perfusion Findings: CBF (<30%) Volume: 59mL Perfusion (Tmax>6.0s) volume: 36mL Mismatch Volume: 64mL Infarction Location:None identified CTA head impression and CT perfusion head impression called by telephone at the time of interpretation on 01/21/2022 at 6:50 pm to provider MCNEILL [facility] , who verbally acknowledged these results. IMPRESSION: CTA neck: 1. The common carotid, internal carotid and vertebral arteries are patent within the neck without stenosis. Minimal non-stenotic atherosclerotic plaque about the carotid bifurcations and at the origin of the right vertebral artery. 2.  Aortic Atherosclerosis (ICD10-I70.0). CTA head: Occluded mid M2 left MCA vessel (with distal reconstitution, likely due to collateral flow). CT perfusion head: The perfusion software identifies a 7 mL region of critically hypoperfused parenchyma within the left insula/frontal lobe (utilizing the Tmax>6 seconds threshold). No core infarct is identified. Reported mismatch volume: 7 mL. Electronically Signed   By: Jackey Loge D.O.   On: 01/21/2022 19:09   CT HEAD CODE STROKE WO CONTRAST  Result Date: 01/21/2022 CLINICAL DATA:  Code stroke. Neuro deficit, acute, stroke suspected. EXAM: CT HEAD  WITHOUT CONTRAST TECHNIQUE: Contiguous axial images were obtained from the base of the skull through the vertex without intravenous contrast. RADIATION DOSE REDUCTION: This exam was performed according to the departmental dose-optimization program which includes automated exposure control, adjustment of the mA and/or kV according to patient size and/or use of iterative reconstruction technique. COMPARISON:  Same-day brain MRI 01/21/2022. FINDINGS: Brain: No age advanced or lobar predominant parenchymal atrophy. A subcentimeter acute infarct within the left frontal lobe Ching matter (adjacent to the left lateral ventricle frontal horn) is occult by CT and was better appreciated on the brain MRI performed earlier today. No CT evidence of interval acute infarct. Minimal chronic small-vessel image changes within the cerebral Kallio matter and pons, better appreciated on the prior brain MRI. There is no acute intracranial hemorrhage. No extra-axial fluid collection. No evidence of an intracranial mass. No midline shift. Vascular: No hyperdense vessel. Skull: No fracture or aggressive osseous lesion. Sinuses/Orbits: No mass or acute finding within the imaged orbits. Trace mucosal thickening within the anterior left ethmoid air cells. ASPECTS ([place] Stroke Program Early CT Score) - Ganglionic level infarction (caudate, lentiform nuclei, internal capsule, insula, M1-M3 cortex): 7 - Supraganglionic infarction (M4-M6 cortex): 3 Total score (0-10 with 10 being normal): 10 These results were communicated to Dr. Amada Jupiter at 6:27 pmon 8/29/2023by text page via the [facility] messaging system. IMPRESSION: A known subcentimeter acute infarct within the left frontal lobe Johndrow matter is occult by CT, and was better appreciated on the brain MRI performed earlier today. No CT evidence of interval acute intracranial abnormality. Minimal chronic small-vessel ischemic changes within the cerebral Clerk matter and pons. Electronically  Signed   By: Jackey Loge D.O.   On: 01/21/2022 18:27   ECHOCARDIOGRAM COMPLETE  Result Date: 01/21/2022    ECHOCARDIOGRAM REPORT   Patient Name:   Chloe Johnson Date of  Exam: 01/21/2022 Medical Rec #:  [id number]    Height:       61.0 in Accession #:    [id number]   Weight:       171.0 lb Date of Birth:  1956/04/04    BSA:          1.767 m Patient Age:    66 years     BP:           146/125 mmHg Patient Gender: F            HR:           72 bpm. Exam Location:  Inpatient Procedure: 2D Echo, Color Doppler and Cardiac Doppler Indications:    Stroke i63.9  History:        Patient has no prior history of Echocardiogram examinations.                 Risk Factors:Hypertension.  Sonographer:    Irving Burton Senior RDCS Referring Phys: [id number] LAURA R INGOLD IMPRESSIONS  1. Left ventricular ejection fraction, by estimation, is 45 to 50%. The left ventricle has mildly decreased function. The left ventricle demonstrates regional wall motion abnormalities (apical hypokinesis with out true aneurysm and with presence of an LV thrombus 1.84 X 0.9 X 1.2 cm).  2. The mitral valve is grossly normal. No evidence of mitral valve regurgitation. No evidence of mitral stenosis.  3. The aortic valve is tricuspid. Aortic valve regurgitation is not visualized. No aortic stenosis is present.  4. Right ventricular systolic function is normal. The right ventricular size is normal. Tricuspid regurgitation signal is inadequate for assessing PA pressure.  5. The inferior vena cava is normal in size with greater than 50% respiratory variability, suggesting right atrial pressure of 3 mmHg. Comparison(s): No prior Echocardiogram. 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 Cardiology aware. FINDINGS  Left Ventricle: Left ventricular ejection fraction, by estimation, is 45 to 50%. The left ventricle has mildly decreased function. The left ventricle demonstrates regional wall motion abnormalities. The left ventricular  internal cavity size was small. There is no left ventricular hypertrophy. Left ventricular diastolic parameters are consistent with Grade I diastolic dysfunction (impaired relaxation).  LV Wall Scoring: The apical lateral segment, apical septal segment, apical anterior segment, and apical inferior segment are hypokinetic. Right Ventricle: The right ventricular size is normal. No increase in right ventricular wall thickness. Right ventricular systolic function is normal. Tricuspid regurgitation signal is inadequate for assessing PA pressure. Left Atrium: Left atrial size was normal in size. Right Atrium: Right atrial size was normal in size. Pericardium: There is no evidence of pericardial effusion. Mitral Valve: The mitral valve is grossly normal. No evidence of mitral valve regurgitation. No evidence of mitral valve stenosis. Tricuspid Valve: The tricuspid valve is normal in structure. Tricuspid valve regurgitation is not demonstrated. No evidence of tricuspid stenosis. Aortic Valve: The aortic valve is tricuspid. Aortic valve regurgitation is not visualized. No aortic stenosis is present. Pulmonic Valve: The pulmonic valve was normal in structure. Pulmonic valve regurgitation is not visualized. No evidence of pulmonic stenosis. Aorta: The aortic root and ascending aorta are structurally normal, with no evidence of dilitation. Venous: The inferior vena cava is normal in size with greater than 50% respiratory variability, suggesting right atrial pressure of 3 mmHg. IAS/Shunts: No atrial level shunt detected by color flow Doppler.  LEFT VENTRICLE PLAX 2D LVIDd:         3.60 cm     Diastology LVIDs:  2.50 cm     LV e' medial:    6.68 cm/s LV PW:         0.80 cm     LV E/e' medial:  6.5 LV IVS:        0.80 cm     LV e' lateral:   11.00 cm/s LVOT diam:     1.70 cm     LV E/e' lateral: 3.9 LV SV:         42 LV SV Index:   24 LVOT Area:     2.27 cm  LV Volumes (MOD) LV vol d, MOD A2C: 70.1 ml LV vol d, MOD A4C:  46.6 ml LV vol s, MOD A2C: 37.3 ml LV vol s, MOD A4C: 23.3 ml LV SV MOD A2C:     32.8 ml LV SV MOD A4C:     46.6 ml LV SV MOD BP:      28.7 ml RIGHT VENTRICLE RV S prime:     9.32 cm/s TAPSE (M-mode): 1.7 cm LEFT ATRIUM             Index        RIGHT ATRIUM          Index LA diam:        2.70 cm 1.53 cm/m   RA Area:     9.52 cm LA Vol (A2C):   28.1 ml 15.90 ml/m  RA Volume:   20.50 ml 11.60 ml/m LA Vol (A4C):   26.3 ml 14.88 ml/m LA Biplane Vol: 28.8 ml 16.30 ml/m  AORTIC VALVE LVOT Vmax:   115.00 cm/s LVOT Vmean:  68.700 cm/s LVOT VTI:    0.185 m  AORTA Ao Root diam: 2.90 cm Ao Asc diam:  3.20 cm MITRAL VALVE MV Area (PHT): 3.11 cm    SHUNTS MV Decel Time: 244 msec    Systemic VTI:  0.18 m MV E velocity: 43.20 cm/s  Systemic Diam: 1.70 cm MV A velocity: 64.50 cm/s MV E/A ratio:  0.67 Riley Lam MD Electronically signed by Riley Lam MD Signature Date/Time: 01/21/2022/4:04:02 PM    Final    MR BRAIN WO CONTRAST  Result Date: 01/21/2022 CLINICAL DATA:  TIA.  Sudden onset of aphasia, now resolved. EXAM: MRI HEAD WITHOUT CONTRAST TECHNIQUE: Multiplanar, multiecho pulse sequences of the brain and surrounding structures were obtained without intravenous contrast. COMPARISON:  None Available. FINDINGS: Brain: Small acute infarct in the left frontal Roskos matter. No hemorrhage, hydrocephalus, mass, or atrophy. Minimal chronic small vessel changes may be present in the pons. No prior infarct is seen. Vascular: Major flow voids are preserved Skull and upper cervical spine: Normal marrow signal. Degenerative facet spurring where covered in the cervical spine with mild C2-3 anterolisthesis. Sinuses/Orbits: Negative IMPRESSION: Small acute infarct in the left frontal Denomme matter. Electronically Signed   By: Tiburcio Pea M.D.   On: 01/21/2022 12:32    PHYSICAL EXAM  Physical Exam  Constitutional: Appears well-developed and well-nourished pleasant middle-age Caucasian lady Cardiovascular:  Normal rate and regular rhythm.  Respiratory: Effort normal, non-labored breathing  Neuro: Mental Status: Patient is awake, alert, oriented to person, place, month, year, and situation Patient is able to give a clear and coherent history. No signs of aphasia or neglect Cranial Nerves: II: Visual Fields are full. Pupils are equal, round, and reactive to light.   III,IV, VI: EOMI without ptosis or diploplia.  V: Facial sensation is symmetric to temperature VII: Facial movement is symmetric resting and smiling VIII:  Hearing is intact to voice X: Palate elevates symmetrically XI: Shoulder shrug is symmetric. XII: Tongue protrudes midline without atrophy or fasciculations.  Motor: Tone is normal. Bulk is normal. 5/5 strength was present in all four extremities.  Sensory: Sensation is symmetric to light touch and temperature in the arms and legs. No extinction to DSS present.  Deep Tendon Reflexes: 2+ and symmetric in the biceps and patellae.  Plantars: Toes are downgoing bilaterally.  Cerebellar: FNF and HKS are intact bilaterally  NIHSS 0   ASSESSMENT/PLAN Chloe Johnson is a 66 y.o. 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 At approximately 12pm on Saturday she noted a sudden onset of dizziness, nausea, vomiting, and diarrhea with a sudden sharp onset of chest pain with radiation to her back. She also dropped two plates stating that she had no control of her upper extremities. She attributed this to her cervical radiculopathy. This improved throughout the day and then on Sunday she noted some word finding difficulties which fluctuated throughout the day. In the ED she had some tangential speech and word finding difficulties that worsened at 6pm as well as facial droop on 8/29. NIHSS from 2 to 6.  She was taken for a mechanical thrombectomy on 8/29 and had a TICI2c revascularization of Distal M2/proximal M3.    Stroke:   acute punctate infarcts in the left external capsule, insula, and frontal lobe cortex secondary to left M2/M3 occlusion from cardiogenic embolism from left ventricular mural thrombus in the setting of acute myocardial infarction Etiology:  In the setting of MI with LV thrombus formation and embolization Code Stroke CT head No acute abnormality. Small vessel disease. Atrophy.  CTA head & neck Occluded mid M2 left MCA vessel  CT perfusion CBF 0, perfusion 12ml, mismatch 47ml Post IR CT No hemorrhage identified MRI - Small acute infarct in the left frontal Pasquariello matter. 8/30 MRI- hows new punctate acute infarcts in the left external capsule, insula, and frontal lobe cortex, and unchanged additional punctate infarct in the left frontal lobe Gellner matter. 2D Echo 45-50%, LV thrombus  LDL 150 HgbA1c 5.0 VTE prophylaxis - SCDs    Diet   Diet NPO time specified   No antithrombotic prior to admission, now on heparin IV.  And aspirin Therapy recommendations:  No follow up needed Disposition:  pending  Distal M2/ proximal M3 occlusion  Mechanical thrombectomy with TICI2c revascularization Now on heparin   LV apical Thrombus Anteroapical myocardial infarction with mural thrombus formation On IV Heparin Cardiology on board Plan for cardiac cath or Coronary CT tomorrow (8/31)  Troponin peak 377 -> 235 Echo showed apical hypokinesis and a left ventricular thrombus  Hypertension Home meds:  None Hypotensive- nonsymptomatic  SBP goal 120-140 IV bolus overnight due to hypotension  Hyperlipidemia Home meds:  None LDL 150, goal < 70 Add Crestor 40mg  Continue statin at discharge  Other Stroke Risk Factors ETOH use, alcohol level <10, advised to drink no more than 1 drink(s) a day Substance abuse - UDS:  THC POSITIVE, Cocaine NONE DETECTED. Patient advised to stop  using due to stroke risk.  Other Active Problems   Hospital day # 1  Patient seen and examined by NP/APP with MD. MD to update note as needed.   , DNP, FNP-BC [facility] Pager: [phone]   STROKE MD NOTE :  I have personally obtained  history,examined this patient, reviewed notes, independently viewed imaging studies, participated in medical decision making and plan of care.ROS completed by me personally and pertinent positives fully documented  I have made any additions or clarifications directly to the above note. Agree with note above.  Patient presented with chest pain and myocardial infarction 2 days prior and developed fluctuating expressive aphasia symptoms which were initially felt to be too mild to treat with her left M2/M3 occlusion but subsequently got neurologically worsening prompting emergent mechanical thrombectomy with successful revascularization.  Her neurological exam is nonfocal with NIH stroke scale of 0.  Recommend close neurological observation and strict blood pressure control with systolic goal 120-140 for the first 24 hours.  Continue IV heparin and add aspirin due to her recent myocardial infarction.  Will need to transition to Eliquis at discharge.  Cardiology to decide on timing of coronary catheterization and revascularization if necessary.  Long discussion with patient and her son and answered questions.  Discussed with Dr. Nahser cardiology.  Discussed with Dr. Sherlon Handing intervElease Hashimotoional neuroradiology. This patient is critically ill and at significant risk of neurological worsening, death and care requires constant monitoring of vital signs, hemodynamics,respiratory and cardiac monitoring, extensive review of multiple databases, frequent neurological assessment, discussion with family, other specialists and medical decision making of high complexity.I have made any additions or clarifications directly to the above note.This critical care  time does not reflect procedure time, or teaching time or supervisory time of PA/NP/Med Resident etc but could involve care discussion time.  I spent 35 minutes of neurocritical care time  in the care of  this patient.     Delia Heady, MD Medical Director [facility] Pager: [phone] 01/22/2022 2:34 PM   To contact Stroke Continuity provider, please refer to [email or web address]. After hours, contact General Neurology

## 2022-01-22 NOTE — Code Documentation (Signed)
Stroke Response Nurse Documentation Code Documentation  Patient from home where she was LKW at 0900 when she suddenly experienced difficulty speaking and weakness. She also complained that she has had N/V for several days. Initially not a code stroke as symptoms resolved before transport to the ED. Amada Jupiter, MD saw her in the ED and was later called back due to patients speech worsening. Patient to CT with team. NIHSS 6, see documentation for details and code stroke times. Patient with facial droop, dysarthria, and aphasia on exam. The following imaging was completed:  CT Head, CTA, and CTP. Patient is not a candidate for IV Thrombolytic. Patient is a candidate for IR after review of scans by de Melchor Amour, MD and Amada Jupiter, MD. Consent was obtained from patients husband and daughter and patient was transferred to IR suite.   Care Plan: Post- IR NIHSS, VS, and Pulse/Vascular checks. Admission to 4N-ICU  Bedside handoff with IR RN.  Toniann Fail  Stroke Response RN

## 2022-01-22 NOTE — Progress Notes (Signed)
Chief Complaint: Code stroke  Referring Physician(s): Ritta Slot  Supervising Physician: Baldemar Lenis  Patient Status: [facility] - In-pt  Brief History: Chloe Johnson is a 66 y.o. female who presented as a code stroke after she dropped some dishes and was unable to speak.  Upon arrival she had aphasia and right facial droop. She also c/o shoulder numbness and nausea and troponins were elevated = 341 -> 377.  She was found to have a distal left M2/proximal M3 occlusion.   She underwent mechanical thrombectomy performed with direct contact aspiration with complete recanalization after one pass (TICI 2C) with slow flow in distal cortical branches yesterday by Dr. Tommie Sams  She is doing much better today. Her speech is fluent and normal. She still has slight facial droop.   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/16/2013   L3-4/notes 11/24/2013, arms, neck   Exertional asthma    GERD (gastroesophageal reflux disease)    Hepatitis    Hypertension    Kidney stones    "have always passed them"   Neck pain    Osteomyelitis (HCC) 11/2013   osteomyelitis, discitis    Sleep concern    uses Prozac for sleep     Past Surgical History:  Procedure Laterality Date   BREAST CYST EXCISION Left 2010   "polypectomy"   PICC LINE PLACE PERIPHERAL ([facility] HX) Right    for use of Levaquin & Vancomycin, of note: she reports that she had a "flulike feeling"    RADIOLOGY WITH ANESTHESIA N/A 01/21/2022   Procedure: IR WITH ANESTHESIA;  Surgeon: Julieanne Cotton, MD;  Location: [facility] OR;  Service: Radiology;  Laterality: N/A;   TONSILLECTOMY AND ADENOIDECTOMY  1975   TUBAL LIGATION  1999    Allergies: Doxycycline, Erythromycin, Iodine, Latex, Shellfish allergy, Strawberry extract, Vancomycin, Vicodin [hydrocodone-acetaminophen], Dilaudid [hydromorphone hcl], Keflex [cephalexin], Penicillins, and Sulfa  antibiotics  Medications: Prior to Admission medications   Medication Sig Start Date End Date Taking? Authorizing Provider  ALPRAZolam Prudy Feeler) 0.5 MG tablet Take 0.5 mg by mouth at bedtime. 12/23/21  Yes [provider]  guaiFENesin (MUCINEX) 600 MG 12 hr tablet Take 600 mg by mouth as needed for cough or to loosen phlegm.   Yes [provider]  lisinopril-hydrochlorothiazide (PRINZIDE,ZESTORETIC) 10-12.5 MG per tablet Take 1 tablet by mouth daily.   Yes [provider]  loratadine (CLARITIN) 10 MG tablet Take 10 mg by mouth every morning.   Yes [provider]  meloxicam (MOBIC) 7.5 MG tablet Take 7.5 mg by mouth in the morning and at bedtime. 12/31/21  Yes [provider]  MYRBETRIQ 25 MG TB24 tablet Take 25 mg by mouth daily. 12/31/21  Yes [provider]  potassium chloride SA (K-DUR,KLOR-CON) 20 MEQ tablet Take 20 mEq by mouth daily.   Yes [provider]  triazolam (HALCION) 0.25 MG tablet Take 0.25 mg by mouth at bedtime. 11/25/21  Yes [provider]  cyclobenzaprine (FLEXERIL) 10 MG tablet Take 1 tablet (10 mg total) by mouth 3 (three) times daily as needed for muscle spasms. 11/17/14   Tia Alert, MD  oxyCODONE-acetaminophen (PERCOCET) 5-325 MG per tablet 1 to 2 tablets every 6 hours as needed for pain. Patient not taking: Reported on 01/21/2022 11/17/14   Tia Alert, MD     Family History  Problem Relation Age of Onset   Other  Mother    Other Father    Hypertension Father     Social History   Socioeconomic History   Marital status: Married    Spouse name: Leonette Most   Number of children: 3   Years of education: RN   Highest education level: Not on file  Occupational History    Employer: [employer]   Tobacco Use   Smoking status: Never   Smokeless tobacco: Never  Substance and Sexual Activity   Alcohol use: Yes    Alcohol/week: 6.0 standard drinks of alcohol    Types: 3 Glasses of wine, 3  Shots of liquor per week   Drug use: No   Sexual activity: Yes    Birth control/protection: Post-menopausal  Other Topics Concern   Not on file  Social History Narrative   Patient is married and lives at home with her husband Leonette Most).  Patient works full time as a Char[employer] fundraiser.   Right handed.   College education.   Social Determinants of Health   Financial Resource Strain: Not on file  Food Insecurity: Not on file  Transportation Needs: Not on file  Physical Activity: Not on file  Stress: Not on file  Social Connections: Not on file   Review of Systems  Vital Signs: BP 108/66 (BP Location: Right Arm)   Pulse 77   Temp 98 F (36.7 C) (Oral)   Resp 20   Ht 5\' 1"  (1.549 m)   Wt 140 lb 10.5 oz (63.8 kg)   SpO2 98%   BMI 26.58 kg/m   Physical Exam Vitals reviewed.  Constitutional:      Appearance: Normal appearance.  Cardiovascular:     Rate and Rhythm: Normal rate.  Pulmonary:     Effort: Pulmonary effort is normal. No respiratory distress.  Abdominal:     Palpations: Abdomen is soft.  Neurological:     Mental Status: She is alert.     Comments: Alert, awake, and oriented x3. Speech and comprehension intact. EOMs intact bilaterally without nystagmus or subjective diplopia. Mild facial asymmetry. Tongue midline. Motor power symmetric proportional to effort. 5/5 Strength bilaterally Sensation normal bilaterally  Common femoral artery puncture site looks good, no bleeding, no hematoma, no pseudoaneurysm   Psychiatric:        Mood and Affect: Mood normal.        Behavior: Behavior normal.        Thought Content: Thought content normal.        Judgment: Judgment normal.     Imaging: CT CEREBRAL PERFUSION W CONTRAST  Result Date: 01/21/2022 CLINICAL DATA:  Neuro deficit, acute, stroke suspected. EXAM: CT ANGIOGRAPHY HEAD AND NECK CT PERFUSION BRAIN TECHNIQUE: Multidetector CT imaging of the head and neck was performed using the standard protocol during bolus  administration of intravenous contrast. Multiplanar CT image reconstructions and MIPs were obtained to evaluate the vascular anatomy. Carotid stenosis measurements (when applicable) are obtained utilizing NASCET criteria, using the distal internal carotid diameter as the denominator. Multiphase CT imaging of the brain was performed following IV bolus contrast injection. Subsequent parametric perfusion maps were calculated using RAPID software. RADIATION DOSE REDUCTION: This exam was performed according to the departmental dose-optimization program which includes automated exposure control, adjustment of the mA and/or kV according to patient size and/or use of iterative reconstruction technique. CONTRAST:  01/23/2022 OMNIPAQUE IOHEXOL 350 MG/ML SOLN COMPARISON:  Brain MRI 01/21/2022. Noncontrast head CT performed earlier today 01/21/2022. FINDINGS: CTA NECK FINDINGS Aortic arch: Standard aortic branching. Mild atherosclerotic  plaque within the visualized aortic arch and proximal major branch vessels of the neck. Streak and beam hardening artifact arising from a dense right-sided contrast bolus partially obscures the right subclavian artery. Within this limitation, there is no appreciable hemodynamically significant innominate or proximal subclavian artery stenosis. Right carotid system: CCA and ICA patent within the neck without stenosis. Mild atherosclerotic plaque about the carotid bifurcation. Left carotid system: CCA and ICA patent within the neck without stenosis. Minimal atherosclerotic plaque about the carotid bifurcation. Vertebral arteries: Vertebral arteries codominant and patent within the neck without stenosis. Nonstenotic atherosclerotic plaque at the origin of the right vertebral artery. Skeleton: Cervical spondylosis. No acute fracture or aggressive osseous lesion. Other neck: No neck mass or cervical lymphadenopathy. Upper chest: No consolidation within the imaged lung apices. Review of the MIP images  confirms the above findings CTA HEAD FINDINGS Anterior circulation: The intracranial internal carotid arteries are patent. The M1 middle cerebral arteries are patent. Occluded mid M2 left MCA vessel (with distal reconstitution, likely due to collateral flow) (for instance as seen on series 15, image 15). The anterior cerebral arteries are patent. No intracranial aneurysm is identified. Posterior circulation: The intracranial vertebral arteries are patent. The basilar artery is patent. The posterior cerebral arteries are patent. Posterior communicating arteries are diminutive or absent, bilaterally. Venous sinuses: Within the limitations of contrast timing, no convincing thrombus. Anatomic variants: As described. Review of the MIP images confirms the above findings CT Brain Perfusion Findings: CBF (<30%) Volume: 21mL Perfusion (Tmax>6.0s) volume: 55mL Mismatch Volume: 10mL Infarction Location:None identified CTA head impression and CT perfusion head impression called by telephone at the time of interpretation on 01/21/2022 at 6:50 pm to provider [facility] , who verbally acknowledged these results. IMPRESSION: CTA neck: 1. The common carotid, internal carotid and vertebral arteries are patent within the neck without stenosis. Minimal non-stenotic atherosclerotic plaque about the carotid bifurcations and at the origin of the right vertebral artery. 2.  Aortic Atherosclerosis (ICD10-I70.0). CTA head: Occluded mid M2 left MCA vessel (with distal reconstitution, likely due to collateral flow). CT perfusion head: The perfusion software identifies a 7 mL region of critically hypoperfused parenchyma within the left insula/frontal lobe (utilizing the Tmax>6 seconds threshold). No core infarct is identified. Reported mismatch volume: 7 mL. Electronically Signed   By: Jackey Loge D.O.   On: 01/21/2022 19:09   CT ANGIO HEAD NECK W WO CM (CODE STROKE)  Result Date: 01/21/2022 CLINICAL DATA:  Neuro deficit, acute,  stroke suspected. EXAM: CT ANGIOGRAPHY HEAD AND NECK CT PERFUSION BRAIN TECHNIQUE: Multidetector CT imaging of the head and neck was performed using the standard protocol during bolus administration of intravenous contrast. Multiplanar CT image reconstructions and MIPs were obtained to evaluate the vascular anatomy. Carotid stenosis measurements (when applicable) are obtained utilizing NASCET criteria, using the distal internal carotid diameter as the denominator. Multiphase CT imaging of the brain was performed following IV bolus contrast injection. Subsequent parametric perfusion maps were calculated using RAPID software. RADIATION DOSE REDUCTION: This exam was performed according to the departmental dose-optimization program which includes automated exposure control, adjustment of the mA and/or kV according to patient size and/or use of iterative reconstruction technique. CONTRAST:  OMNIPAQUE IOHEXOL 350 MG/ML SOLN COMPARISON:  Brain MRI 01/21/2022. Noncontrast head CT performed earlier today 01/21/2022. FINDINGS: CTA NECK FINDINGS Aortic arch: Standard aortic branching. Mild atherosclerotic plaque within the visualized aortic arch and proximal major branch vessels of the neck. Streak and beam hardening artifact arising from a dense  right-sided contrast bolus partially obscures the right subclavian artery. Within this limitation, there is no appreciable hemodynamically significant innominate or proximal subclavian artery stenosis. Right carotid system: CCA and ICA patent within the neck without stenosis. Mild atherosclerotic plaque about the carotid bifurcation. Left carotid system: CCA and ICA patent within the neck without stenosis. Minimal atherosclerotic plaque about the carotid bifurcation. Vertebral arteries: Vertebral arteries codominant and patent within the neck without stenosis. Nonstenotic atherosclerotic plaque at the origin of the right vertebral artery. Skeleton: Cervical spondylosis. No acute  fracture or aggressive osseous lesion. Other neck: No neck mass or cervical lymphadenopathy. Upper chest: No consolidation within the imaged lung apices. Review of the MIP images confirms the above findings CTA HEAD FINDINGS Anterior circulation: The intracranial internal carotid arteries are patent. The M1 middle cerebral arteries are patent. Occluded mid M2 left MCA vessel (with distal reconstitution, likely due to collateral flow) (for instance as seen on series 15, image 15). The anterior cerebral arteries are patent. No intracranial aneurysm is identified. Posterior circulation: The intracranial vertebral arteries are patent. The basilar artery is patent. The posterior cerebral arteries are patent. Posterior communicating arteries are diminutive or absent, bilaterally. Venous sinuses: Within the limitations of contrast timing, no convincing thrombus. Anatomic variants: As described. Review of the MIP images confirms the above findings CT Brain Perfusion Findings: CBF (<30%) Volume: 0mL Perfusion (Tmax>6.0s) volume: 7mL Mismatch Volume: 0mL Infarction Location:None identified CTA head impression and CT perfusion head impression called by telephone at the time of interpretation on 01/21/2022 at 6:50 pm to provider [facility] , who verbally acknowledged these results. IMPRESSION: CTA neck: 1. The common carotid, internal carotid and vertebral arteries are patent within the neck without stenosis. Minimal non-stenotic atherosclerotic plaque about the carotid bifurcations and at the origin of the right vertebral artery. 2.  Aortic Atherosclerosis (ICD10-I70.0). CTA head: Occluded mid M2 left MCA vessel (with distal reconstitution, likely due to collateral flow). CT perfusion head: The perfusion software identifies a 7 mL region of critically hypoperfused parenchyma within the left insula/frontal lobe (utilizing the Tmax>6 seconds threshold). No core infarct is identified. Reported mismatch volume: 7 mL.  Electronically Signed   By: Jackey Loge D.O.   On: 01/21/2022 19:09   CT HEAD CODE STROKE WO CONTRAST  Result Date: 01/21/2022 CLINICAL DATA:  Code stroke. Neuro deficit, acute, stroke suspected. EXAM: CT HEAD WITHOUT CONTRAST TECHNIQUE: Contiguous axial images were obtained from the base of the skull through the vertex without intravenous contrast. RADIATION DOSE REDUCTION: This exam was performed according to the departmental dose-optimization program which includes automated exposure control, adjustment of the mA and/or kV according to patient size and/or use of iterative reconstruction technique. COMPARISON:  Same-day brain MRI 01/21/2022. FINDINGS: Brain: No age advanced or lobar predominant parenchymal atrophy. A subcentimeter acute infarct within the left frontal lobe Kisiel matter (adjacent to the left lateral ventricle frontal horn) is occult by CT and was better appreciated on the brain MRI performed earlier today. No CT evidence of interval acute infarct. Minimal chronic small-vessel image changes within the cerebral Chancellor matter and pons, better appreciated on the prior brain MRI. There is no acute intracranial hemorrhage. No extra-axial fluid collection. No evidence of an intracranial mass. No midline shift. Vascular: No hyperdense vessel. Skull: No fracture or aggressive osseous lesion. Sinuses/Orbits: No mass or acute finding within the imaged orbits. Trace mucosal thickening within the anterior left ethmoid air cells. ASPECTS [facility] Stroke Program Early CT Score) - Ganglionic level infarction (caudate, lentiform  nuclei, internal capsule, insula, M1-M3 cortex): 7 - Supraganglionic infarction (M4-M6 cortex): 3 Total score (0-10 with 10 being normal): 10 These results were communicated to Dr. Amada Jupiter at 6:27 pmon 8/29/2023by text page via the [facility] messaging system. IMPRESSION: A known subcentimeter acute infarct within the left frontal lobe Gallien matter is occult by CT, and was better  appreciated on the brain MRI performed earlier today. No CT evidence of interval acute intracranial abnormality. Minimal chronic small-vessel ischemic changes within the cerebral Pusch matter and pons. Electronically Signed   By: Jackey Loge D.O.   On: 01/21/2022 18:27   ECHOCARDIOGRAM COMPLETE  Result Date: 01/21/2022    ECHOCARDIOGRAM REPORT   Patient Name:   Chloe Johnson Date of Exam: 01/21/2022 Medical Rec #:  [id number]    Height:       61.0 in Accession #:    [id number]   Weight:       171.0 lb Date of Birth:  [birth year]    BSA:          1.767 m Patient Age:    66 years     BP:           146/125 mmHg Patient Gender: F            HR:           72 bpm. Exam Location:  Inpatient Procedure: 2D Echo, Color Doppler and Cardiac Doppler Indications:    Stroke i63.9  History:        Patient has no prior history of Echocardiogram examinations.                 Risk Factors:Hypertension.  Sonographer:    Irving Burton Senior RDCS Referring Phys: [id number] LAURA R INGOLD IMPRESSIONS  1. Left ventricular ejection fraction, by estimation, is 45 to 50%. The left ventricle has mildly decreased function. The left ventricle demonstrates regional wall motion abnormalities (apical hypokinesis with out true aneurysm and with presence of an LV thrombus 1.84 X 0.9 X 1.2 cm).  2. The mitral valve is grossly normal. No evidence of mitral valve regurgitation. No evidence of mitral stenosis.  3. The aortic valve is tricuspid. Aortic valve regurgitation is not visualized. No aortic stenosis is present.  4. Right ventricular systolic function is normal. The right ventricular size is normal. Tricuspid regurgitation signal is inadequate for assessing PA pressure.  5. The inferior vena cava is normal in size with greater than 50% respiratory variability, suggesting right atrial pressure of 3 mmHg. Comparison(s): No prior Echocardiogram. 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 Cardiology aware. FINDINGS  Left  Ventricle: Left ventricular ejection fraction, by estimation, is 45 to 50%. The left ventricle has mildly decreased function. The left ventricle demonstrates regional wall motion abnormalities. The left ventricular internal cavity size was small. There is no left ventricular hypertrophy. Left ventricular diastolic parameters are consistent with Grade I diastolic dysfunction (impaired relaxation).  LV Wall Scoring: The apical lateral segment, apical septal segment, apical anterior segment, and apical inferior segment are hypokinetic. Right Ventricle: The right ventricular size is normal. No increase in right ventricular wall thickness. Right ventricular systolic function is normal. Tricuspid regurgitation signal is inadequate for assessing PA pressure. Left Atrium: Left atrial size was normal in size. Right Atrium: Right atrial size was normal in size. Pericardium: There is no evidence of pericardial effusion. Mitral Valve: The mitral valve is grossly normal. No evidence of mitral valve regurgitation. No evidence of mitral valve stenosis. Tricuspid Valve:  The tricuspid valve is normal in structure. Tricuspid valve regurgitation is not demonstrated. No evidence of tricuspid stenosis. Aortic Valve: The aortic valve is tricuspid. Aortic valve regurgitation is not visualized. No aortic stenosis is present. Pulmonic Valve: The pulmonic valve was normal in structure. Pulmonic valve regurgitation is not visualized. No evidence of pulmonic stenosis. Aorta: The aortic root and ascending aorta are structurally normal, with no evidence of dilitation. Venous: The inferior vena cava is normal in size with greater than 50% respiratory variability, suggesting right atrial pressure of 3 mmHg. IAS/Shunts: No atrial level shunt detected by color flow Doppler.  LEFT VENTRICLE PLAX 2D LVIDd:         3.60 cm     Diastology LVIDs:         2.50 cm     LV e' medial:    6.68 cm/s LV PW:         0.80 cm     LV E/e' medial:  6.5 LV IVS:         0.80 cm     LV e' lateral:   11.00 cm/s LVOT diam:     1.70 cm     LV E/e' lateral: 3.9 LV SV:         42 LV SV Index:   24 LVOT Area:     2.27 cm  LV Volumes (MOD) LV vol d, MOD A2C: 70.1 ml LV vol d, MOD A4C: 46.6 ml LV vol s, MOD A2C: 37.3 ml LV vol s, MOD A4C: 23.3 ml LV SV MOD A2C:     32.8 ml LV SV MOD A4C:     46.6 ml LV SV MOD BP:      28.7 ml RIGHT VENTRICLE RV S prime:     9.32 cm/s TAPSE (M-mode): 1.7 cm LEFT ATRIUM             Index        RIGHT ATRIUM          Index LA diam:        2.70 cm 1.53 cm/m   RA Area:     9.52 cm LA Vol (A2C):   28.1 ml 15.90 ml/m  RA Volume:   20.50 ml 11.60 ml/m LA Vol (A4C):   26.3 ml 14.88 ml/m LA Biplane Vol: 28.8 ml 16.30 ml/m  AORTIC VALVE LVOT Vmax:   115.00 cm/s LVOT Vmean:  68.700 cm/s LVOT VTI:    0.185 m  AORTA Ao Root diam: 2.90 cm Ao Asc diam:  3.20 cm MITRAL VALVE MV Area (PHT): 3.11 cm    SHUNTS MV Decel Time: 244 msec    Systemic VTI:  0.18 m MV E velocity: 43.20 cm/s  Systemic Diam: 1.70 cm MV A velocity: 64.50 cm/s MV E/A ratio:  0.67 Riley Lam MD Electronically signed by Riley Lam MD Signature Date/Time: 01/21/2022/4:04:02 PM    Final    MR BRAIN WO CONTRAST  Result Date: 01/21/2022 CLINICAL DATA:  TIA.  Sudden onset of aphasia, now resolved. EXAM: MRI HEAD WITHOUT CONTRAST TECHNIQUE: Multiplanar, multiecho pulse sequences of the brain and surrounding structures were obtained without intravenous contrast. COMPARISON:  None Available. FINDINGS: Brain: Small acute infarct in the left frontal Mancillas matter. No hemorrhage, hydrocephalus, mass, or atrophy. Minimal chronic small vessel changes may be present in the pons. No prior infarct is seen. Vascular: Major flow voids are preserved Skull and upper cervical spine: Normal marrow signal. Degenerative facet spurring where covered in the cervical spine with  mild C2-3 anterolisthesis. Sinuses/Orbits: Negative IMPRESSION: Small acute infarct in the left frontal Cotrell matter.  Electronically Signed   By: Tiburcio Pea M.D.   On: 01/21/2022 12:32    Labs:  CBC: Recent Labs    01/21/22 1445 01/22/22 0351  WBC 4.9 4.9  HGB 12.5 12.0  HCT 37.2 35.8*  PLT 175 187    COAGS: Recent Labs    01/21/22 1445  INR 1.1    BMP: Recent Labs    01/21/22 1445 01/22/22 0827  NA 138 139  K 3.7 4.3  CL 105 107  CO2 22 20*  GLUCOSE 94 113*  BUN 18 15  CALCIUM 10.0 8.6*  CREATININE 0.82 0.87  GFRNONAA >60 >60    LIVER FUNCTION TESTS: Recent Labs    01/21/22 1445  BILITOT 1.0  AST 19  ALT 16  ALKPHOS 47  PROT 6.8  ALBUMIN 4.1    TUMOR MARKERS: No results for input(s): "AFPTM", "CEA", "CA199", "CHROMGRNA" in the last 8760 hours.  Assessment and Plan:  Code stroke due to distal left M2/proximal M3 occlusion.   S/P mechanical thrombectomy performed with direct contact aspiration with complete recanalization after one pass (TICI 2C) with slow flow in distal cortical branches by Dr. Tommie Sams  Care per Neurology.  Thank you for allowing our service to participate in Chloe Johnson 's care.  Electronically Signed: Gwynneth Macleod, PA-C   01/22/2022, 11:10 AM      I spent a total of    15 Minutes in face to face in clinical consultation, greater than 50% of which was counseling/coordinating care for f/u cerebral intervention.

## 2022-01-22 NOTE — TOC Benefit Eligibility Note (Signed)
Patient Product/process development scientist completed.    The patient is currently admitted and upon discharge could be taking Eliquis 5 mg.  The current 30 day co-pay is $85.02.   The patient is insured through [employer]    Roland Earl, CPhT Pharmacy Patient Advocate Specialist [facility] Patient Advocate Team Direct Number: [phone]  Fax: [phone]

## 2022-01-22 NOTE — Progress Notes (Signed)
Rounding Note    Patient Name: Chloe Johnson Date of Encounter: 01/22/2022  [facility] Cardiologist: Yarah Fuente   Subjective   66 year old female who presents with 3 to 4-day history of left shoulder and arm pain accompanied by nausea.  Several days later she had a stroke.   Echocardiogram yesterday reveals the presence of anterior apical akinesis with an LV apical thrombus.  It appears that she had anteroapical myocardial infarction 3 to 4 days ago complicated by mural thrombus formation and then later embolic event which caused her stroke.  She had urgent cerebral angiogram followed by mechanical thrombectomy last night.  She is doing quite a bit better today.  We have canceled the heart catheterization that we had originally planned for today.  We will write her for a diet.     Inpatient Medications    Scheduled Meds:   stroke: early stages of recovery book   Does not apply Once   Chlorhexidine Gluconate Cloth  6 each Topical Q0600   loratadine  10 mg Oral BH-q7a   melatonin  3 mg Oral QHS   mirabegron ER  25 mg Oral Daily   Continuous Infusions:  clevidipine Stopped (01/22/22 0017)   PRN Meds: acetaminophen **OR** acetaminophen (TYLENOL) oral liquid 160 mg/5 mL **OR** acetaminophen, acetaminophen **OR** acetaminophen (TYLENOL) oral liquid 160 mg/5 mL **OR** acetaminophen, ALPRAZolam, cyclobenzaprine, guaiFENesin, senna-docusate   Vital Signs    Vitals:   01/22/22 0600 01/22/22 0643 01/22/22 0700 01/22/22 0800  BP: (!) 92/57 102/72 102/66 96/66  Pulse: 73 70 72 66  Resp: 19 14 17 13   Temp:    98 F (36.7 C)  TempSrc:    Oral  SpO2: 99% 95% 97% 96%  Weight:      Height:        Intake/Output Summary (Last 24 hours) at 01/22/2022 0819 Last data filed at 01/22/2022 0800 Gross per 24 hour  Intake 2364.11 ml  Output 900 ml  Net 1464.11 ml      01/21/2022    9:00 PM 01/21/2022   10:58 AM 11/15/2014    8:12 AM  Last 3 Weights  Weight (lbs) 140 lb 10.5  oz 171 lb 171 lb 12 oz  Weight (kg) 63.8 kg 77.565 kg 77.905 kg      Telemetry     NSR - Personally Reviewed  ECG     - Personally Reviewed  Physical Exam   GEN: No acute distress.   Neck: No JVD Cardiac: RRR, no murmurs, rubs, or gallops.  Respiratory: Clear to auscultation bilaterally. GI: Soft, nontender, non-distended  MS: No edema; No deformity. Neuro:  Nonfocal  Psych: Normal affect   Labs    High Sensitivity Troponin:   Recent Labs  Lab 01/21/22 1102 01/21/22 1309  TROPONINIHS 341* 377*     Chemistry Recent Labs  Lab 01/21/22 1445  NA 138  K 3.7  CL 105  CO2 22  GLUCOSE 94  BUN 18  CREATININE 0.82  CALCIUM 10.0  MG 2.1  PROT 6.8  ALBUMIN 4.1  AST 19  ALT 16  ALKPHOS 47  BILITOT 1.0  GFRNONAA >60  ANIONGAP 11    Lipids  Recent Labs  Lab 01/22/22 0351  CHOL 223*  TRIG 164*  HDL 40*  LDLCALC 150*  CHOLHDL 5.6    Hematology Recent Labs  Lab 01/21/22 1445 01/22/22 0351  WBC 4.9 4.9  RBC 4.35 4.11  HGB 12.5 12.0  HCT 37.2 35.8*  MCV 85.5 87.1  MCH 28.7 29.2  MCHC 33.6 33.5  RDW 11.6 11.9  PLT 175 187   Thyroid No results for input(s): "TSH", "FREET4" in the last 168 hours.  BNPNo results for input(s): "BNP", "PROBNP" in the last 168 hours.  DDimer No results for input(s): "DDIMER" in the last 168 hours.   Radiology    CT CEREBRAL PERFUSION W CONTRAST  Result Date: 01/21/2022 CLINICAL DATA:  Neuro deficit, acute, stroke suspected. EXAM: CT ANGIOGRAPHY HEAD AND NECK CT PERFUSION BRAIN TECHNIQUE: Multidetector CT imaging of the head and neck was performed using the standard protocol during bolus administration of intravenous contrast. Multiplanar CT image reconstructions and MIPs were obtained to evaluate the vascular anatomy. Carotid stenosis measurements (when applicable) are obtained utilizing NASCET criteria, using the distal internal carotid diameter as the denominator. Multiphase CT imaging of the brain was performed following  IV bolus contrast injection. Subsequent parametric perfusion maps were calculated using RAPID software. RADIATION DOSE REDUCTION: This exam was performed according to the departmental dose-optimization program which includes automated exposure control, adjustment of the mA and/or kV according to patient size and/or use of iterative reconstruction technique. CONTRAST:  OMNIPAQUE IOHEXOL 350 MG/ML SOLN COMPARISON:  Brain MRI 01/21/2022. Noncontrast head CT performed earlier today 01/21/2022. FINDINGS: CTA NECK FINDINGS Aortic arch: Standard aortic branching. Mild atherosclerotic plaque within the visualized aortic arch and proximal major branch vessels of the neck. Streak and beam hardening artifact arising from a dense right-sided contrast bolus partially obscures the right subclavian artery. Within this limitation, there is no appreciable hemodynamically significant innominate or proximal subclavian artery stenosis. Right carotid system: CCA and ICA patent within the neck without stenosis. Mild atherosclerotic plaque about the carotid bifurcation. Left carotid system: CCA and ICA patent within the neck without stenosis. Minimal atherosclerotic plaque about the carotid bifurcation. Vertebral arteries: Vertebral arteries codominant and patent within the neck without stenosis. Nonstenotic atherosclerotic plaque at the origin of the right vertebral artery. Skeleton: Cervical spondylosis. No acute fracture or aggressive osseous lesion. Other neck: No neck mass or cervical lymphadenopathy. Upper chest: No consolidation within the imaged lung apices. Review of the MIP images confirms the above findings CTA HEAD FINDINGS Anterior circulation: The intracranial internal carotid arteries are patent. The M1 middle cerebral arteries are patent. Occluded mid M2 left MCA vessel (with distal reconstitution, likely due to collateral flow) (for instance as seen on series 15, image 15). The anterior cerebral arteries are patent.  No intracranial aneurysm is identified. Posterior circulation: The intracranial vertebral arteries are patent. The basilar artery is patent. The posterior cerebral arteries are patent. Posterior communicating arteries are diminutive or absent, bilaterally. Venous sinuses: Within the limitations of contrast timing, no convincing thrombus. Anatomic variants: As described. Review of the MIP images confirms the above findings CT Brain Perfusion Findings: CBF (<30%) Volume: 78mL Perfusion (Tmax>6.0s) volume: 6mL Mismatch Volume: 23mL Infarction Location:None identified CTA head impression and CT perfusion head impression called by telephone at the time of interpretation on 01/21/2022 at 6:50 pm to provider MCNEILL [facility] , who verbally acknowledged these results. IMPRESSION: CTA neck: 1. The common carotid, internal carotid and vertebral arteries are patent within the neck without stenosis. Minimal non-stenotic atherosclerotic plaque about the carotid bifurcations and at the origin of the right vertebral artery. 2.  Aortic Atherosclerosis (ICD10-I70.0). CTA head: Occluded mid M2 left MCA vessel (with distal reconstitution, likely due to collateral flow). CT perfusion head: The perfusion software identifies a 7 mL region of critically hypoperfused parenchyma within the left insula/frontal  lobe (utilizing the Tmax>6 seconds threshold). No core infarct is identified. Reported mismatch volume: 7 mL. Electronically Signed   By: Jackey Loge D.O.   On: 01/21/2022 19:09   CT ANGIO HEAD NECK W WO CM (CODE STROKE)  Result Date: 01/21/2022 CLINICAL DATA:  Neuro deficit, acute, stroke suspected. EXAM: CT ANGIOGRAPHY HEAD AND NECK CT PERFUSION BRAIN TECHNIQUE: Multidetector CT imaging of the head and neck was performed using the standard protocol during bolus administration of intravenous contrast. Multiplanar CT image reconstructions and MIPs were obtained to evaluate the vascular anatomy. Carotid stenosis measurements (when  applicable) are obtained utilizing NASCET criteria, using the distal internal carotid diameter as the denominator. Multiphase CT imaging of the brain was performed following IV bolus contrast injection. Subsequent parametric perfusion maps were calculated using RAPID software. RADIATION DOSE REDUCTION: This exam was performed according to the departmental dose-optimization program which includes automated exposure control, adjustment of the mA and/or kV according to patient size and/or use of iterative reconstruction technique. CONTRAST:  OMNIPAQUE IOHEXOL 350 MG/ML SOLN COMPARISON:  Brain MRI 01/21/2022. Noncontrast head CT performed earlier today 01/21/2022. FINDINGS: CTA NECK FINDINGS Aortic arch: Standard aortic branching. Mild atherosclerotic plaque within the visualized aortic arch and proximal major branch vessels of the neck. Streak and beam hardening artifact arising from a dense right-sided contrast bolus partially obscures the right subclavian artery. Within this limitation, there is no appreciable hemodynamically significant innominate or proximal subclavian artery stenosis. Right carotid system: CCA and ICA patent within the neck without stenosis. Mild atherosclerotic plaque about the carotid bifurcation. Left carotid system: CCA and ICA patent within the neck without stenosis. Minimal atherosclerotic plaque about the carotid bifurcation. Vertebral arteries: Vertebral arteries codominant and patent within the neck without stenosis. Nonstenotic atherosclerotic plaque at the origin of the right vertebral artery. Skeleton: Cervical spondylosis. No acute fracture or aggressive osseous lesion. Other neck: No neck mass or cervical lymphadenopathy. Upper chest: No consolidation within the imaged lung apices. Review of the MIP images confirms the above findings CTA HEAD FINDINGS Anterior circulation: The intracranial internal carotid arteries are patent. The M1 middle cerebral arteries are patent.  Occluded mid M2 left MCA vessel (with distal reconstitution, likely due to collateral flow) (for instance as seen on series 15, image 15). The anterior cerebral arteries are patent. No intracranial aneurysm is identified. Posterior circulation: The intracranial vertebral arteries are patent. The basilar artery is patent. The posterior cerebral arteries are patent. Posterior communicating arteries are diminutive or absent, bilaterally. Venous sinuses: Within the limitations of contrast timing, no convincing thrombus. Anatomic variants: As described. Review of the MIP images confirms the above findings CT Brain Perfusion Findings: CBF (<30%) Volume: 45mL Perfusion (Tmax>6.0s) volume: 72mL Mismatch Volume: 34mL Infarction Location:None identified CTA head impression and CT perfusion head impression called by telephone at the time of interpretation on 01/21/2022 at 6:50 pm to provider MCNEILL [facility] , who verbally acknowledged these results. IMPRESSION: CTA neck: 1. The common carotid, internal carotid and vertebral arteries are patent within the neck without stenosis. Minimal non-stenotic atherosclerotic plaque about the carotid bifurcations and at the origin of the right vertebral artery. 2.  Aortic Atherosclerosis (ICD10-I70.0). CTA head: Occluded mid M2 left MCA vessel (with distal reconstitution, likely due to collateral flow). CT perfusion head: The perfusion software identifies a 7 mL region of critically hypoperfused parenchyma within the left insula/frontal lobe (utilizing the Tmax>6 seconds threshold). No core infarct is identified. Reported mismatch volume: 7 mL. Electronically Signed   By: Ronaldo Miyamoto  Renette Butters D.O.   On: 01/21/2022 19:09   CT HEAD CODE STROKE WO CONTRAST  Result Date: 01/21/2022 CLINICAL DATA:  Code stroke. Neuro deficit, acute, stroke suspected. EXAM: CT HEAD WITHOUT CONTRAST TECHNIQUE: Contiguous axial images were obtained from the base of the skull through the vertex without intravenous  contrast. RADIATION DOSE REDUCTION: This exam was performed according to the departmental dose-optimization program which includes automated exposure control, adjustment of the mA and/or kV according to patient size and/or use of iterative reconstruction technique. COMPARISON:  Same-day brain MRI 01/21/2022. FINDINGS: Brain: No age advanced or lobar predominant parenchymal atrophy. A subcentimeter acute infarct within the left frontal lobe [place] matter (adjacent to the left lateral ventricle frontal horn) is occult by CT and was better appreciated on the brain MRI performed earlier today. No CT evidence of interval acute infarct. Minimal chronic small-vessel image changes within the cerebral [place] matter and pons, better appreciated on the prior brain MRI. There is no acute intracranial hemorrhage. No extra-axial fluid collection. No evidence of an intracranial mass. No midline shift. Vascular: No hyperdense vessel. Skull: No fracture or aggressive osseous lesion. Sinuses/Orbits: No mass or acute finding within the imaged orbits. Trace mucosal thickening within the anterior left ethmoid air cells. ASPECTS ([place] Stroke Program Early CT Score) - Ganglionic level infarction (caudate, lentiform nuclei, internal capsule, insula, M1-M3 cortex): 7 - Supraganglionic infarction (M4-M6 cortex): 3 Total score (0-10 with 10 being normal): 10 These results were communicated to Dr. Amada Jupiter at 6:27 pmon 8/29/2023by text page via the [facility] messaging system. IMPRESSION: A known subcentimeter acute infarct within the left frontal lobe [place] matter is occult by CT, and was better appreciated on the brain MRI performed earlier today. No CT evidence of interval acute intracranial abnormality. Minimal chronic small-vessel ischemic changes within the cerebral [place] matter and pons. Electronically Signed   By: Jackey Loge D.O.   On: 01/21/2022 18:27   ECHOCARDIOGRAM COMPLETE  Result Date: 01/21/2022    ECHOCARDIOGRAM REPORT    Patient Name:   Chloe Johnson Date of Exam: 01/21/2022 Medical Rec #:  [id number]    Height:       61.0 in Accession #:    [id number]   Weight:       171.0 lb Date of Birth:  [birth year]    BSA:          1.767 m Patient Age:    66 years     BP:           146/125 mmHg Patient Gender: F            HR:           72 bpm. Exam Location:  Inpatient Procedure: 2D Echo, Color Doppler and Cardiac Doppler Indications:    Stroke i63.9  History:        Patient has no prior history of Echocardiogram examinations.                 Risk Factors:Hypertension.  Sonographer:    Irving Burton Senior RDCS Referring Phys: [id number] LAURA R INGOLD IMPRESSIONS  1. Left ventricular ejection fraction, by estimation, is 45 to 50%. The left ventricle has mildly decreased function. The left ventricle demonstrates regional wall motion abnormalities (apical hypokinesis with out true aneurysm and with presence of an LV thrombus 1.84 X 0.9 X 1.2 cm).  2. The mitral valve is grossly normal. No evidence of mitral valve regurgitation. No evidence of mitral stenosis.  3. The aortic valve is tricuspid. Aortic  valve regurgitation is not visualized. No aortic stenosis is present.  4. Right ventricular systolic function is normal. The right ventricular size is normal. Tricuspid regurgitation signal is inadequate for assessing PA pressure.  5. The inferior vena cava is normal in size with greater than 50% respiratory variability, suggesting right atrial pressure of 3 mmHg. Comparison(s): No prior Echocardiogram. 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 Cardiology aware. FINDINGS  Left Ventricle: Left ventricular ejection fraction, by estimation, is 45 to 50%. The left ventricle has mildly decreased function. The left ventricle demonstrates regional wall motion abnormalities. The left ventricular internal cavity size was small. There is no left ventricular hypertrophy. Left ventricular diastolic parameters are consistent with Grade I  diastolic dysfunction (impaired relaxation).  LV Wall Scoring: The apical lateral segment, apical septal segment, apical anterior segment, and apical inferior segment are hypokinetic. Right Ventricle: The right ventricular size is normal. No increase in right ventricular wall thickness. Right ventricular systolic function is normal. Tricuspid regurgitation signal is inadequate for assessing PA pressure. Left Atrium: Left atrial size was normal in size. Right Atrium: Right atrial size was normal in size. Pericardium: There is no evidence of pericardial effusion. Mitral Valve: The mitral valve is grossly normal. No evidence of mitral valve regurgitation. No evidence of mitral valve stenosis. Tricuspid Valve: The tricuspid valve is normal in structure. Tricuspid valve regurgitation is not demonstrated. No evidence of tricuspid stenosis. Aortic Valve: The aortic valve is tricuspid. Aortic valve regurgitation is not visualized. No aortic stenosis is present. Pulmonic Valve: The pulmonic valve was normal in structure. Pulmonic valve regurgitation is not visualized. No evidence of pulmonic stenosis. Aorta: The aortic root and ascending aorta are structurally normal, with no evidence of dilitation. Venous: The inferior vena cava is normal in size with greater than 50% respiratory variability, suggesting right atrial pressure of 3 mmHg. IAS/Shunts: No atrial level shunt detected by color flow Doppler.  LEFT VENTRICLE PLAX 2D LVIDd:         3.60 cm     Diastology LVIDs:         2.50 cm     LV e' medial:    6.68 cm/s LV PW:         0.80 cm     LV E/e' medial:  6.5 LV IVS:        0.80 cm     LV e' lateral:   11.00 cm/s LVOT diam:     1.70 cm     LV E/e' lateral: 3.9 LV SV:         42 LV SV Index:   24 LVOT Area:     2.27 cm  LV Volumes (MOD) LV vol d, MOD A2C: 70.1 ml LV vol d, MOD A4C: 46.6 ml LV vol s, MOD A2C: 37.3 ml LV vol s, MOD A4C: 23.3 ml LV SV MOD A2C:     32.8 ml LV SV MOD A4C:     46.6 ml LV SV MOD BP:      28.7 ml  RIGHT VENTRICLE RV S prime:     9.32 cm/s TAPSE (M-mode): 1.7 cm LEFT ATRIUM             Index        RIGHT ATRIUM          Index LA diam:        2.70 cm 1.53 cm/m   RA Area:     9.52 cm LA Vol (A2C):   28.1 ml  15.90 ml/m  RA Volume:   20.50 ml 11.60 ml/m LA Vol (A4C):   26.3 ml 14.88 ml/m LA Biplane Vol: 28.8 ml 16.30 ml/m  AORTIC VALVE LVOT Vmax:   115.00 cm/s LVOT Vmean:  68.700 cm/s LVOT VTI:    0.185 m  AORTA Ao Root diam: 2.90 cm Ao Asc diam:  3.20 cm MITRAL VALVE MV Area (PHT): 3.11 cm    SHUNTS MV Decel Time: 244 msec    Systemic VTI:  0.18 m MV E velocity: 43.20 cm/s  Systemic Diam: 1.70 cm MV A velocity: 64.50 cm/s MV E/A ratio:  0.67 Riley Lam MD Electronically signed by Riley Lam MD Signature Date/Time: 01/21/2022/4:04:02 PM    Final    MR BRAIN WO CONTRAST  Result Date: 01/21/2022 CLINICAL DATA:  TIA.  Sudden onset of aphasia, now resolved. EXAM: MRI HEAD WITHOUT CONTRAST TECHNIQUE: Multiplanar, multiecho pulse sequences of the brain and surrounding structures were obtained without intravenous contrast. COMPARISON:  None Available. FINDINGS: Brain: Small acute infarct in the left frontal [place] matter. No hemorrhage, hydrocephalus, mass, or atrophy. Minimal chronic small vessel changes may be present in the pons. No prior infarct is seen. Vascular: Major flow voids are preserved Skull and upper cervical spine: Normal marrow signal. Degenerative facet spurring where covered in the cervical spine with mild C2-3 anterolisthesis. Sinuses/Orbits: Negative IMPRESSION: Small acute infarct in the left frontal [place] matter. Electronically Signed   By: Tiburcio Pea M.D.   On: 01/21/2022 12:32    Cardiac Studies      Patient Profile     66 y.o. female with a recent myocardial infarction complicated by an apical thrombus.  She had an embolic stroke several days later.  Assessment & Plan    1.  Recent non-ST segment elevation myocardial infarction: The patient had left  shoulder and arm pain several days ago.   I suspect this was her infarct.  She has been under considerable stress with her job.  She has had a coronary calcium score of 0 last year.  It is possible that this could be coronary spasm.  The echo looks atypical for Takotsubo syndrome.  We will plan on doing a heart catheterization when she is stable from a neurology standpoint.  This could possibly be later today or more likely tomorrow.  She is not having any angina.  2.  Left ventricular thrombus: I discussed with Dr. Pearlean Brownie.  He will rescan her brain today.  She needs to be on some anticoagulation for this LV thrombus but will need to make sure that her stroke is stable.  3.  Mild LV dysfunction: Secondary to an anterior apical myocardial infarction.  We will start medical therapy as soon as she is more stable.  Blood pressure is too low to initiate ARB or any additional medications.  4.  Embolic stroke: Plans per the neuro team/stroke team.  5.  Hypertension: Her antihypertensives are currently on hold due to hypotension.  6.  Hyperlipidemia:     For questions or updates, please contact [facility] Please consult [email or web address] for contact info under        Signed, Kristeen Miss, MD  01/22/2022, 8:19 AM

## 2022-01-22 NOTE — Progress Notes (Signed)
While pt was being admitted to 4N22 at 2100, phlebotomy came into the room to confirm pt was a lab draw and to tell me she would go ahead a draw a few labs, including the heparin level that was timed for 2300.  Phlebotomy never came back to draw the labs.  At 0045 this RN called phlebotomy to follow up and make sure they knew she was a lab draw.  Phlebotomy said someone would be up shortly to draw the labs.

## 2022-01-23 ENCOUNTER — Other Ambulatory Visit: Payer: Self-pay | Admitting: Cardiology

## 2022-01-23 ENCOUNTER — Inpatient Hospital Stay (HOSPITAL_COMMUNITY): Payer: Federal, State, Local not specified - PPO

## 2022-01-23 ENCOUNTER — Other Ambulatory Visit (HOSPITAL_COMMUNITY): Payer: Self-pay

## 2022-01-23 DIAGNOSIS — I513 Intracardiac thrombosis, not elsewhere classified: Secondary | ICD-10-CM

## 2022-01-23 DIAGNOSIS — I5020 Unspecified systolic (congestive) heart failure: Secondary | ICD-10-CM

## 2022-01-23 DIAGNOSIS — I63412 Cerebral infarction due to embolism of left middle cerebral artery: Secondary | ICD-10-CM | POA: Diagnosis not present

## 2022-01-23 LAB — HEPARIN LEVEL (UNFRACTIONATED)
Heparin Unfractionated: 0.29 IU/mL — ABNORMAL LOW (ref 0.30–0.70)
Heparin Unfractionated: 0.6 IU/mL (ref 0.30–0.70)

## 2022-01-23 SURGERY — LEFT HEART CATH AND CORONARY ANGIOGRAPHY
Anesthesia: LOCAL

## 2022-01-23 MED ORDER — ROSUVASTATIN CALCIUM 20 MG PO TABS: 20.0000 mg | ORAL_TABLET | Freq: Every day | ORAL | 1 refills | Status: DC

## 2022-01-23 MED ORDER — ROSUVASTATIN CALCIUM 20 MG PO TABS: 20.0000 mg | ORAL_TABLET | Freq: Every day | ORAL | Status: DC

## 2022-01-23 MED ORDER — IOHEXOL 350 MG/ML SOLN
95.0000 mL | Freq: Once | INTRAVENOUS | Status: AC | PRN
Start: 2022-01-23 — End: 2022-01-23
  Administered 2022-01-23: 95 mL via INTRAVENOUS

## 2022-01-23 MED ORDER — SACUBITRIL-VALSARTAN 24-26 MG PO TABS: 1.0000 | ORAL_TABLET | Freq: Two times a day (BID) | ORAL | 1 refills | Status: DC

## 2022-01-23 MED ORDER — NITROGLYCERIN 0.4 MG SL SUBL: 0.8000 mg | SUBLINGUAL_TABLET | Freq: Once | SUBLINGUAL | Status: AC

## 2022-01-23 MED ORDER — APIXABAN 5 MG PO TABS
5.0000 mg | ORAL_TABLET | Freq: Two times a day (BID) | ORAL | Status: DC
  Administered 2022-01-23: 5 mg via ORAL
  Filled 2022-01-23: qty 1

## 2022-01-23 MED ORDER — SACUBITRIL-VALSARTAN 24-26 MG PO TABS
1.0000 | ORAL_TABLET | Freq: Two times a day (BID) | ORAL | Status: DC
  Administered 2022-01-23: 1 via ORAL
  Filled 2022-01-23 (×2): qty 1

## 2022-01-23 MED ORDER — NITROGLYCERIN 0.4 MG SL SUBL
SUBLINGUAL_TABLET | SUBLINGUAL | Status: AC
  Administered 2022-01-23: 0.8 mg via SUBLINGUAL
  Filled 2022-01-23: qty 2

## 2022-01-23 MED ORDER — APIXABAN 5 MG PO TABS: 5.0000 mg | ORAL_TABLET | Freq: Two times a day (BID) | ORAL | 1 refills | Status: DC

## 2022-01-23 NOTE — Progress Notes (Signed)
ANTICOAGULATION CONSULT NOTE  Pharmacy Consult for Heparin infusion Indication: stroke, apical thrombus  Allergies  Allergen Reactions   Doxycycline Nausea Only    malaise    Erythromycin Nausea And Vomiting   Iodine    Latex Other (See Comments)    Irritation of the skin   Shellfish Allergy Other (See Comments)    GI- Facial  Acne   Strawberry Extract Other (See Comments)    Mouth ulcers    Vancomycin    Vicodin [Hydrocodone-Acetaminophen] Other (See Comments)    Restless, tolerate percocet   Dilaudid [Hydromorphone Hcl] Rash    *Pt states she can take if given benadryl prior*   Keflex [Cephalexin] Rash   Penicillins Rash    Hives   Sulfa Antibiotics Rash    Hives    Patient Measurements: Height: 5\' 1"  (154.9 cm) Weight: 63.8 kg (140 lb 10.5 oz) IBW/kg (Calculated) : 47.8 Heparin Dosing Weight: 65.1 kg  Vital Signs: Temp: 97.8 F (36.6 C) (08/31 1152) Temp Source: Axillary (08/31 1152) BP: 128/77 (08/31 1100) Pulse Rate: 64 (08/31 1100)  Labs: Recent Labs    01/21/22 1102 01/21/22 1309 01/21/22 1445 01/22/22 0351 01/22/22 0351 01/22/22 0827 01/22/22 1659 01/23/22 0009 01/23/22 0723  HGB  --   --  12.5 12.0  --   --   --   --   --   HCT  --   --  37.2 35.8*  --   --   --   --   --   PLT  --   --  175 187  --   --   --   --   --   LABPROT  --   --  13.8  --   --   --   --   --   --   INR  --   --  1.1  --   --   --   --   --   --   HEPARINUNFRC  --   --   --  <0.10*   < >  --  0.30 0.29* 0.60  CREATININE  --   --  0.82  --   --  0.87  --   --   --   TROPONINIHS 341* 377*  --   --   --  235*  --   --   --    < > = values in this interval not displayed.     Estimated Creatinine Clearance: 55.9 mL/min (by C-G formula based on SCr of 0.87 mg/dL).   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   01/20/2022   guaiFENesin (MUCINEX) 600 MG 12 hr tablet Take 600 mg by mouth as needed for cough or to loosen phlegm.   Past Week   lisinopril-hydrochlorothiazide (PRINZIDE,ZESTORETIC) 10-12.5 MG per tablet Take 1 tablet by  mouth daily.   01/21/2022   loratadine (CLARITIN) 10 MG tablet Take 10 mg by mouth every morning.   01/21/2022   meloxicam (MOBIC) 7.5 MG tablet Take 7.5 mg by mouth in the morning and at bedtime.   01/21/2022   MYRBETRIQ 25 MG TB24 tablet Take 25 mg by mouth daily.   01/21/2022   potassium chloride SA (K-DUR,KLOR-CON) 20 MEQ tablet Take 20 mEq by mouth daily.   01/21/2022   triazolam (HALCION) 0.25 MG tablet Take 0.25 mg by mouth at bedtime.   01/20/2022   cyclobenzaprine (FLEXERIL) 10 MG tablet Take 1 tablet (10 mg total) by mouth 3 (three) times daily as needed for muscle spasms. 90 tablet 5    oxyCODONE-acetaminophen (PERCOCET) 5-325 MG per tablet 1 to 2 tablets every 6 hours as needed for pain. (Patient not taking: Reported on 01/21/2022) 60 tablet 0 Not Taking    Assessment: 66 yo F who presented with embolic stroke. Appears patient had MI 3 to 4 days PTA complicated by mural thrombus formation and later embolic event. S/p urgent cerebral angiogram and mechanical thrombectomy 8/30. Patient does not take any anticoagulants at home. Pharmacy consulted to dose heparin infusion.   Heparin level now slightly high at 0.6 for lower goal range 0.3-0.5. CBC stable. No bleeding or issues with infusion per discussion with RN.   Goal of Therapy:  Heparin level 0.3-0.5 units/ml Monitor platelets by anticoagulation  protocol: Yes   Plan:  No boluses per stroke protocol Reduce heparin infusion slightly back to 850 units/hr  Check 6hr heparin level Monitor daily CBC, s/sx bleeding Coronary CT planned today - DOAC planned post-procedure   Leia Alf, PharmD, BCPS Please check AMION for all [facility] contact numbers Clinical Pharmacist 01/23/2022 12:54 PM

## 2022-01-23 NOTE — Progress Notes (Signed)
After visit summary (AVS) reviewed with patient.  Vital signs stable at time of discharge.  All patient belonging accounted for per patient.  Patient discharge home with husband and daughter.

## 2022-01-23 NOTE — TOC CAGE-AID Note (Signed)
Transition of Care [facility]) - CAGE-AID Screening   Patient Details  Name: Chloe Johnson MRN: [id number] Date of Birth: 10-Jan-1956  [facility]) CM/SW Contact:    Mearl Latin, LCSW Phone Number: 01/23/2022, 1:52 PM   Clinical Narrative: Patient denied ETOH or substance use. No resources offered as none indicated.   CAGE-AID Screening:    Have You Ever Felt You Ought to Cut Down on Your Drinking or Drug Use?: No Have People Annoyed You By Critizing Your Drinking Or Drug Use?: No Have You Felt Bad Or Guilty About Your Drinking Or Drug Use?: No Have You Ever Had a Drink or Used Drugs First Thing In The Morning to Steady Your Nerves or to Get Rid of a Hangover?: No CAGE-AID Score: 0  Substance Abuse Education Offered: No (no use indicated)

## 2022-01-23 NOTE — Discharge Summary (Addendum)
Stroke Discharge Summary  Patient ID: Chloe Johnson   MRN: [id number]      DOB: [birth year]  Date of Admission: 01/21/2022 Date of Discharge: 01/23/2022  Attending Physician:  Stroke, Md, MD, Stroke MD Consultant(s):    cardiology, neurology IR  Patient's PCP:  Lavone Orn, MD  DISCHARGE DIAGNOSIS:  Principal Problem:   CVA (cerebral vascular accident) (HCC)acute punctate infarcts in the left external capsule, insula, and frontal lobe cortex secondary to left M2/M3 occlusion s/p TICI2c revascularization Etiology: MI with LV apical thrombus formation and embolization Active Problems:   Hypertension   Anxiety   NSTEMI (non-ST elevated myocardial infarction) ([place])   LV (left ventricular) mural thrombus   HFrEF (heart failure with reduced ejection fraction) (HCC)   Hyperlipidemia   Lumbar stenosis   Stroke (cerebrum) (HCC)   Allergies as of 01/23/2022       Reactions   Doxycycline Nausea Only   malaise    Erythromycin Nausea And Vomiting   Iodine    Latex Other (See Comments)   Irritation of the skin   Shellfish Allergy Other (See Comments)   GI- Facial  Acne   Strawberry Extract Other (See Comments)   Mouth ulcers   Vancomycin    Vicodin [hydrocodone-acetaminophen] Other (See Comments)   Restless, tolerate percocet   Dilaudid [hydromorphone Hcl] Rash   *Pt states she can take if given benadryl prior*   Keflex [cephalexin] Rash   Penicillins Rash   Hives   Sulfa Antibiotics Rash   Hives        Medication List     STOP taking these medications    lisinopril-hydrochlorothiazide 10-12.5 MG tablet Commonly known as: ZESTORETIC   meloxicam 7.5 MG tablet Commonly known as: MOBIC   oxyCODONE-acetaminophen 5-325 MG tablet Commonly known as: Percocet   potassium chloride SA 20 MEQ tablet Commonly known as: KLOR-CON M   triazolam 0.25 MG tablet Commonly known as: HALCION       TAKE these medications    ALPRAZolam 0.5 MG tablet Commonly known as:  XANAX Take 0.5 mg by mouth at bedtime.   apixaban 5 MG Tabs tablet Commonly known as: ELIQUIS Take 1 tablet (5 mg total) by mouth 2 (two) times daily.   cyclobenzaprine 10 MG tablet Commonly known as: FLEXERIL Take 1 tablet (10 mg total) by mouth 3 (three) times daily as needed for muscle spasms.   guaiFENesin 600 MG 12 hr tablet Commonly known as: MUCINEX Take 600 mg by mouth as needed for cough or to loosen phlegm.   loratadine 10 MG tablet Commonly known as: CLARITIN Take 10 mg by mouth every morning.   Myrbetriq 25 MG Tb24 tablet Generic drug: mirabegron ER Take 25 mg by mouth daily.   rosuvastatin 20 MG tablet Commonly known as: CRESTOR Take 1 tablet (20 mg total) by mouth daily. Start taking on: January 24, 2022   sacubitril-valsartan 24-26 MG Commonly known as: ENTRESTO Take 1 tablet by mouth 2 (two) times daily.        LABORATORY STUDIES CBC    Component Value Date/Time   WBC 4.9 01/22/2022 0351   RBC 4.11 01/22/2022 0351   HGB 12.0 01/22/2022 0351   HCT 35.8 (L) 01/22/2022 0351   PLT 187 01/22/2022 0351   MCV 87.1 01/22/2022 0351   MCH 29.2 01/22/2022 0351   MCHC 33.5 01/22/2022 0351   RDW 11.9 01/22/2022 0351   LYMPHSABS 1.6 01/21/2022 1445   MONOABS 0.4 01/21/2022 1445  EOSABS 0.1 01/21/2022 1445   BASOSABS 0.0 01/21/2022 1445   CMP    Component Value Date/Time   NA 139 01/22/2022 0827   K 4.3 01/22/2022 0827   CL 107 01/22/2022 0827   CO2 20 (L) 01/22/2022 0827   GLUCOSE 113 (H) 01/22/2022 0827   BUN 15 01/22/2022 0827   CREATININE 0.87 01/22/2022 0827   CALCIUM 8.6 (L) 01/22/2022 0827   PROT 6.8 01/21/2022 1445   ALBUMIN 4.1 01/21/2022 1445   AST 19 01/21/2022 1445   ALT 16 01/21/2022 1445   ALKPHOS 47 01/21/2022 1445   BILITOT 1.0 01/21/2022 1445   GFRNONAA >60 01/22/2022 0827   GFRAA >60 11/15/2014 1719   COAGS Lab Results  Component Value Date   INR 1.1 01/21/2022   INR 1.03 11/03/2014   INR 1.10 11/17/2013   Lipid  Panel    Component Value Date/Time   CHOL 223 (H) 01/22/2022 0351   TRIG 164 (H) 01/22/2022 0351   HDL 40 (L) 01/22/2022 0351   CHOLHDL 5.6 01/22/2022 0351   VLDL 33 01/22/2022 0351   LDLCALC 150 (H) 01/22/2022 0351   HgbA1C  Lab Results  Component Value Date   HGBA1C 5.0 01/22/2022   Urinalysis    Component Value Date/Time   COLORURINE YELLOW 01/21/2022 1138   APPEARANCEUR HAZY (A) 01/21/2022 1138   LABSPEC 1.011 01/21/2022 1138   PHURINE 5.0 01/21/2022 1138   GLUCOSEU NEGATIVE 01/21/2022 1138   HGBUR NEGATIVE 01/21/2022 1138   BILIRUBINUR NEGATIVE 01/21/2022 1138   [place] 01/21/2022 1138   PROTEINUR NEGATIVE 01/21/2022 1138   UROBILINOGEN 0.2 11/16/2013 2159   NITRITE NEGATIVE 01/21/2022 1138   LEUKOCYTESUR NEGATIVE 01/21/2022 1138   Urine Drug Screen     Component Value Date/Time   LABOPIA NONE DETECTED 01/21/2022 1138   COCAINSCRNUR NONE DETECTED 01/21/2022 1138   LABBENZ POSITIVE (A) 01/21/2022 1138   AMPHETMU NONE DETECTED 01/21/2022 1138   THCU POSITIVE (A) 01/21/2022 1138   LABBARB NONE DETECTED 01/21/2022 1138    Alcohol Level    Component Value Date/Time   ETH <10 01/21/2022 1445     SIGNIFICANT DIAGNOSTIC STUDIES CT CORONARY MORPH W/CTA COR W/SCORE W/CA W/CM &/OR WO/CM  Addendum Date: 01/23/2022   ADDENDUM REPORT: 01/23/2022 13:25 EXAM: OVER-READ INTERPRETATION  CT CHEST The following report is a limited chest CT over-read performed by radiologist Dr. Lindaann Slough [facility] on 01/23/2022. This over-read does not include interpretation of cardiac or coronary anatomy or pathology. The coronary CTA interpretation by the cardiologist is attached. COMPARISON:  Radiographs 11/21/2021. Coronary artery calcium score CT 10/04/2020 FINDINGS: Mediastinum/Nodes: No enlarged lymph nodes within the visualized mediastinum. Lungs/Pleura: There is no pleural effusion. The visualized lungs appear clear. Upper abdomen: No significant findings  within the visualized upper abdomen. Musculoskeletal/Chest wall: No chest wall mass or suspicious osseous findings within the visualized chest. IMPRESSION: No significant extracardiac findings within the visualized chest. Electronically Signed   By: Richardean Sale M.D.   On: 01/23/2022 13:25   Result Date: 01/23/2022 HISTORY: Chest pain/anginal equiv, ECGs or troponins abnormal EXAM: Cardiac/Coronary CT TECHNIQUE: The patient was scanned on a [employer]. PROTOCOL: A 100 kV prospective scan was triggered in the descending thoracic aorta at 111 HU's. Axial non-contrast 3 mm slices were carried out through the heart. The data set was analyzed on a dedicated work station and scored using the Agatston method. Gantry rotation speed was 250 msecs and collimation was .6 mm. Heart rate was optimized medically  and sl NTG was given. The 3D data set was reconstructed in 5% intervals of the 35-75 % of the R-R cycle. Systolic and diastolic phases were analyzed on a dedicated work station using MPR, MIP and VRT modes. The patient received contrast. FINDINGS: Coronary calcium score: The patient's coronary artery calcium score is 0, which places the patient in the 0 percentile. Coronary arteries: Normal coronary origins.  Right dominance. Right Coronary Artery: Normal caliber vessel, gives rise to small PDA. No significant plaque or stenosis. Left Main Coronary Artery: Normal caliber vessel. No significant plaque or stenosis. Small caliber ramus without significant plaque or stenosis. Left Anterior Descending Coronary Artery: Normal caliber vessel. Minimal noncalcified plaque with 1-24% stenosis. Gives rise to large first diagonal branch without significant stenosis. Distal LAD wraps apex Left Circumflex Artery: Normal caliber vessel. Very trivial noncalcified plaque without significant stenosis. Gives rise to large first and second, small third OM branches. Aorta: Normal size, 31 mm at the mid ascending aorta (level  of the PA bifurcation) measured double oblique. Trivial aortic atherosclerosis. No dissection seen in visualized portions of the aorta. Aortic Valve: No calcifications. Trileaflet. Other findings: Normal pulmonary vein drainage into the left atrium. Normal left atrial appendage without a thrombus. Appendage is chicken wing type. Normal size of the pulmonary artery. Normal appearance of the pericardium. There is an apical LV thrombus, dimensions saves on PACS images. Maximum dimension 15.6 x 11.3 mm There is a small accessory left atrial segment on the cranial aspect of the left atrium. IMPRESSION: 1.  Very minimal nonobstructive CAD, CADRADS = 1. 2. Coronary calcium score of 0. This was 0 percentile for age and sex matched control. 3. Normal coronary origin with right dominance. 4. There is an apical LV thrombus, dimensions saves on PACS images. Maximum dimension 15.6 x 11.3 mm Findings reported to ordering provider. INTERPRETATION: CAD-RADS 1: Minimal non-obstructive CAD (0-24%). Consider non-atherosclerotic causes of chest pain. Consider preventive therapy and risk factor modification. Electronically Signed: By: Buford Dresser M.D. 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 She initially presented on 01/21/2022 with transient episode of aphasia followed sudden onset dizziness, nausea, vomiting, chest pain that radiated to upper back on Saturday. MRI brain showed small ischemic infarct of the frontal Pacetti matter on the left. Her troponins were elevated to over 300 and her echocardiogram showed apical hypokinesis and a left ventricular  thrombus consistent with NSTEMI. Around 6 p.m. on 01/21/2022, patient developed worsening aphasia, NIHSS 6. Head CT was unremarkable. CT angiogram of the head and neck showed a left M2/M3-MCA occlusion. She was then transferred to our service for an emergency mechanical thrombectomy. EXAM: ULTRASOUND-GUIDED VASCULAR ACCESS DIAGNOSTIC CEREBRAL ANGIOGRAM MECHANICAL THROMBECTOMY FLAT PANEL HEAD CT COMPARISON:  CT/CT angiogram of the head and neck January 22, 2019 MEDICATIONS: No antibiotic was administered. ANESTHESIA/SEDATION: The procedure was performed under general anesthesia. CONTRAST:  75 mL of Omnipaque 300 milligram/mL. FLUOROSCOPY: Radiation Exposure Index (as provided by the fluoroscopic device): 786.7 mGy Kerma COMPLICATIONS: None immediate. TECHNIQUE: Informed written consent was obtained from the patient after a thorough discussion of the procedural risks, benefits and alternatives. All questions were addressed. Maximal Sterile Barrier Technique was utilized including caps, mask, sterile gowns, sterile gloves, sterile drape, hand hygiene and skin  antiseptic. A timeout was performed prior to the initiation of the procedure. The right groin was prepped and draped in the usual sterile fashion. Using a micropuncture kit and the modified Seldinger technique, access was gained to the right common femoral artery and an 8 French sheath was placed. Real-time ultrasound guidance was utilized for vascular access including the acquisition of a permanent ultrasound image documenting patency of the accessed vessel. Under fluoroscopy, a Zoom 88 guide catheter was navigated over a 6 [place] VTK catheter and a 0.035" Terumo Glidewire into the aortic arch. The catheter was placed into the left common carotid artery and then advanced into the left internal carotid artery. The diagnostic catheter was removed. Frontal and lateral angiograms of the head were obtained. FINDINGS: 1. Normal caliber of the right common femoral  artery, adequate for vascular access. 2. There is no occlusion of the distal left M2/MCA superior division branch extending to the M3 segment. PROCEDURE: Using biplane roadmap, a Vect 46 aspiration catheter was navigated over an Aristotle 24 microguidewire into the cavernous segment of the left ICA. The aspiration catheter was then advanced over the wire to the level of occlusion in the M2-M3 segment and connected to an aspiration pump. Continuous aspiration was performed for 2 minutes. The guide catheter was connected to a VacLok syringe. The aspiration catheter was subsequently removed under constant aspiration. The guide catheter was aspirated for debris. Left internal carotid artery angiograms with magnified frontal and lateral views of the head showed recanalization of the left MCA with slow flow in a few distal cortical branches (TICI 2C). No embolus to new territory. Flat panel CT of the head was obtained and post processed in a separate workstation with concurrent attending physician supervision. Selected images were sent to PACS. No evidence of hemorrhagic complication. Delayed left internal carotid artery angiogram showed persistent patency of the left MCA. Right common femoral artery angiogram was obtained in right anterior oblique view. The puncture is at the level of the common femoral artery. The artery has normal caliber, adequate for closure device. The sheath was exchanged over the wire for a Perclose prostyle which was utilized for access closure. Immediate hemostasis was achieved. IMPRESSION: 1. Successful mechanical thrombectomy for treatment of a distal left M2/MCA occlusion with direct contact aspiration achieving complete recanalization (TICI 2C). 2. No thromboembolic or hemorrhagic complication. PLAN: Transfer to ICU for continued post stroke care. Electronically Signed   By: Pedro Earls M.D.   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 She initially presented on 01/21/2022 with transient episode of aphasia followed sudden onset dizziness, nausea, vomiting, chest pain that radiated to upper back on Saturday. MRI brain showed small ischemic infarct of the frontal Below matter on the left. Her troponins were elevated to over 300 and her echocardiogram showed apical hypokinesis and a left ventricular thrombus consistent with NSTEMI. Around 6 p.m. on 01/21/2022, patient developed worsening aphasia, NIHSS 6. Head CT was unremarkable. CT angiogram of the head and neck showed a left M2/M3-MCA occlusion. She was then transferred to our service for an emergency mechanical thrombectomy. EXAM: ULTRASOUND-GUIDED VASCULAR ACCESS DIAGNOSTIC CEREBRAL ANGIOGRAM MECHANICAL THROMBECTOMY FLAT PANEL HEAD CT COMPARISON:  CT/CT angiogram of the head and neck January 22, 2019 MEDICATIONS: No antibiotic was  administered. ANESTHESIA/SEDATION: The procedure was performed under general anesthesia. CONTRAST:  75 mL of Omnipaque 300 milligram/mL. FLUOROSCOPY: Radiation Exposure Index (as provided by the fluoroscopic device): 850.2 mGy Kerma COMPLICATIONS: None immediate. TECHNIQUE: Informed written consent was obtained from the patient after a thorough discussion of the procedural risks, benefits and alternatives. All questions were addressed. Maximal Sterile Barrier Technique was utilized including caps, mask, sterile gowns, sterile gloves, sterile drape, hand hygiene and skin antiseptic. A timeout was performed prior to the initiation of the procedure. The right groin was prepped and draped in the usual sterile fashion. Using a micropuncture kit and the modified Seldinger technique, access was  gained to the right common femoral artery and an 8 French sheath was placed. Real-time ultrasound guidance was utilized for vascular access including the acquisition of a permanent ultrasound image documenting patency of the accessed vessel. Under fluoroscopy, a Zoom 88 guide catheter was navigated over a 6 [place] VTK catheter and a 0.035" Terumo Glidewire into the aortic arch. The catheter was placed into the left common carotid artery and then advanced into the left internal carotid artery. The diagnostic catheter was removed. Frontal and lateral angiograms of the head were obtained. FINDINGS: 1. Normal caliber of the right common femoral artery, adequate for vascular access. 2. There is no occlusion of the distal left M2/MCA superior division branch extending to the M3 segment. PROCEDURE: Using biplane roadmap, a Vect 46 aspiration catheter was navigated over an Aristotle 24 microguidewire into the cavernous segment of the left ICA. The aspiration catheter was then advanced over the wire to the level of occlusion in the M2-M3 segment and connected to an aspiration pump. Continuous aspiration was performed for 2 minutes. The guide catheter was connected to a VacLok syringe. The aspiration catheter was subsequently removed under constant aspiration. The guide catheter was aspirated for debris. Left internal carotid artery angiograms with magnified frontal and lateral views of the head showed recanalization of the left MCA with slow flow in a few distal cortical branches (TICI 2C). No embolus to new territory. Flat panel CT of the head was obtained and post processed in a separate workstation with concurrent attending physician supervision. Selected images were sent to PACS. No evidence of hemorrhagic complication. Delayed left internal carotid artery angiogram showed persistent patency of the left MCA. Right common femoral artery angiogram was obtained in right anterior oblique view. The puncture is at the level of  the common femoral artery. The artery has normal caliber, adequate for closure device. The sheath was exchanged over the wire for a Perclose prostyle which was utilized for access closure. Immediate hemostasis was achieved. IMPRESSION: 1. Successful mechanical thrombectomy for treatment of a distal left M2/MCA occlusion with direct contact aspiration achieving complete recanalization (TICI 2C). 2. No thromboembolic or hemorrhagic complication. PLAN: Transfer to ICU for continued post stroke care. Electronically Signed   By: Pedro Earls M.D.   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 She initially presented on 01/21/2022 with transient episode of aphasia followed sudden onset dizziness, nausea, vomiting, chest pain that radiated to upper back on Saturday. MRI brain showed small ischemic infarct of the frontal Spagnuolo matter on the left. Her troponins were elevated to over 300 and her echocardiogram showed  apical hypokinesis and a left ventricular thrombus consistent with NSTEMI. Around 6 p.m. on 01/21/2022, patient developed worsening aphasia, NIHSS 6. Head CT was unremarkable. CT angiogram of the head and neck showed a left M2/M3-MCA occlusion. She was then transferred to our service for an emergency mechanical thrombectomy. EXAM: ULTRASOUND-GUIDED VASCULAR ACCESS DIAGNOSTIC CEREBRAL ANGIOGRAM MECHANICAL THROMBECTOMY FLAT PANEL HEAD CT COMPARISON:  CT/CT angiogram of the head and neck January 22, 2019 MEDICATIONS: No antibiotic was administered. ANESTHESIA/SEDATION: The procedure was performed under general anesthesia. CONTRAST:  75 mL of Omnipaque 300 milligram/mL.  FLUOROSCOPY: Radiation Exposure Index (as provided by the fluoroscopic device): 944.9 mGy Kerma COMPLICATIONS: None immediate. TECHNIQUE: Informed written consent was obtained from the patient after a thorough discussion of the procedural risks, benefits and alternatives. All questions were addressed. Maximal Sterile Barrier Technique was utilized including caps, mask, sterile gowns, sterile gloves, sterile drape, hand hygiene and skin antiseptic. A timeout was performed prior to the initiation of the procedure. The right groin was prepped and draped in the usual sterile fashion. Using a micropuncture kit and the modified Seldinger technique, access was gained to the right common femoral artery and an 8 French sheath was placed. Real-time ultrasound guidance was utilized for vascular access including the acquisition of a permanent ultrasound image documenting patency of the accessed vessel. Under fluoroscopy, a Zoom 88 guide catheter was navigated over a 6 [place] VTK catheter and a 0.035" Terumo Glidewire into the aortic arch. The catheter was placed into the left common carotid artery and then advanced into the left internal carotid artery. The diagnostic catheter was removed. Frontal and lateral angiograms of the head were obtained. FINDINGS: 1. Normal caliber of the right common femoral artery, adequate for vascular access. 2. There is no occlusion of the distal left M2/MCA superior division branch extending to the M3 segment. PROCEDURE: Using biplane roadmap, a Vect 46 aspiration catheter was navigated over an Aristotle 24 microguidewire into the cavernous segment of the left ICA. The aspiration catheter was then advanced over the wire to the level of occlusion in the M2-M3 segment and connected to an aspiration pump. Continuous aspiration was performed for 2 minutes. The guide catheter was connected to a VacLok syringe. The aspiration catheter was subsequently removed under constant aspiration. The guide  catheter was aspirated for debris. Left internal carotid artery angiograms with magnified frontal and lateral views of the head showed recanalization of the left MCA with slow flow in a few distal cortical branches (TICI 2C). No embolus to new territory. Flat panel CT of the head was obtained and post processed in a separate workstation with concurrent attending physician supervision. Selected images were sent to PACS. No evidence of hemorrhagic complication. Delayed left internal carotid artery angiogram showed persistent patency of the left MCA. Right common femoral artery angiogram was obtained in right anterior oblique view. The puncture is at the level of the common femoral artery. The artery has normal caliber, adequate for closure device. The sheath was exchanged over the wire for a Perclose prostyle which was utilized for access closure. Immediate hemostasis was achieved. IMPRESSION: 1. Successful mechanical thrombectomy for treatment of a distal left M2/MCA occlusion with direct contact aspiration achieving complete recanalization (TICI 2C). 2. No thromboembolic or hemorrhagic complication. PLAN: Transfer to ICU for continued post stroke care. Electronically Signed   By: Pedro Earls M.D.   On: 01/22/2022 14:25   MR BRAIN WO CONTRAST  Result Date: 01/22/2022 CLINICAL DATA:  Follow-up stroke EXAM: MRI HEAD  WITHOUT CONTRAST TECHNIQUE: Multiplanar, multiecho pulse sequences of the brain and surrounding structures were obtained without intravenous contrast. COMPARISON:  Brain MRI and CT/CTA head and neck 1 day prior FINDINGS: Brain: A punctate fusion of diffusion restriction in the left frontal lobe periventricular Shughart matter is unchanged compared to the study from 1 day prior. Additional small foci of diffusion restriction in the left external capsule, insula, and frontal lobe cortex are new. There is no associated hemorrhage or mass effect. Parenchymal volume is normal. The ventricles  are normal in size. Parenchymal signal is otherwise normal, with no significant burden of under vitamin chronic small vessel ischemic change. There is no mass lesion.  There is no mass effect or midline shift. Vascular: Normal flow voids. Skull and upper cervical spine: Normal marrow signal. Sinuses/Orbits: The paranasal sinuses are clear. The globes and orbits are unremarkable. Other: None. IMPRESSION: New punctate acute infarcts in the left external capsule, insula, and frontal lobe cortex, and unchanged additional punctate infarct in the left frontal lobe [place] matter. Electronically Signed   By: Valetta Mole M.D.   On: 01/22/2022 11:39   CT CEREBRAL PERFUSION W CONTRAST  Result Date: 01/21/2022 CLINICAL DATA:  Neuro deficit, acute, stroke suspected. EXAM: CT ANGIOGRAPHY HEAD AND NECK CT PERFUSION BRAIN TECHNIQUE: Multidetector CT imaging of the head and neck was performed using the standard protocol during bolus administration of intravenous contrast. Multiplanar CT image reconstructions and MIPs were obtained to evaluate the vascular anatomy. Carotid stenosis measurements (when applicable) are obtained utilizing NASCET criteria, using the distal internal carotid diameter as the denominator. Multiphase CT imaging of the brain was performed following IV bolus contrast injection. Subsequent parametric perfusion maps were calculated using RAPID software. RADIATION DOSE REDUCTION: This exam was performed according to the departmental dose-optimization program which includes automated exposure control, adjustment of the mA and/or kV according to patient size and/or use of iterative reconstruction technique. CONTRAST:  155mL OMNIPAQUE IOHEXOL 350 MG/ML SOLN COMPARISON:  Brain MRI 01/21/2022. Noncontrast head CT performed earlier today 01/21/2022. FINDINGS: CTA NECK FINDINGS Aortic arch: Standard aortic branching. Mild atherosclerotic plaque within the visualized aortic arch and proximal major branch vessels of the  neck. Streak and beam hardening artifact arising from a dense right-sided contrast bolus partially obscures the right subclavian artery. Within this limitation, there is no appreciable hemodynamically significant innominate or proximal subclavian artery stenosis. Right carotid system: CCA and ICA patent within the neck without stenosis. Mild atherosclerotic plaque about the carotid bifurcation. Left carotid system: CCA and ICA patent within the neck without stenosis. Minimal atherosclerotic plaque about the carotid bifurcation. Vertebral arteries: Vertebral arteries codominant and patent within the neck without stenosis. Nonstenotic atherosclerotic plaque at the origin of the right vertebral artery. Skeleton: Cervical spondylosis. No acute fracture or aggressive osseous lesion. Other neck: No neck mass or cervical lymphadenopathy. Upper chest: No consolidation within the imaged lung apices. Review of the MIP images confirms the above findings CTA HEAD FINDINGS Anterior circulation: The intracranial internal carotid arteries are patent. The M1 middle cerebral arteries are patent. Occluded mid M2 left MCA vessel (with distal reconstitution, likely due to collateral flow) (for instance as seen on series 15, image 15). The anterior cerebral arteries are patent. No intracranial aneurysm is identified. Posterior circulation: The intracranial vertebral arteries are patent. The basilar artery is patent. The posterior cerebral arteries are patent. Posterior communicating arteries are diminutive or absent, bilaterally. Venous sinuses: Within the limitations of contrast timing, no convincing thrombus. Anatomic variants: As described. Review  of the MIP images confirms the above findings CT Brain Perfusion Findings: CBF (<30%) Volume: 34m Perfusion (Tmax>6.0s) volume: 757mMismatch Volume: 69m3mnfarction Location:None identified CTA head impression and CT perfusion head impression called by telephone at the time of interpretation  on 01/21/2022 at 6:50 pm to provider MCNEILL KIROhio[facility] Hospitalwho verbally acknowledged these results. IMPRESSION: CTA neck: 1. The common carotid, internal carotid and vertebral arteries are patent within the neck without stenosis. Minimal non-stenotic atherosclerotic plaque about the carotid bifurcations and at the origin of the right vertebral artery. 2.  Aortic Atherosclerosis (ICD10-I70.0). CTA head: Occluded mid M2 left MCA vessel (with distal reconstitution, likely due to collateral flow). CT perfusion head: The perfusion software identifies a 7 mL region of critically hypoperfused parenchyma within the left insula/frontal lobe (utilizing the Tmax>6 seconds threshold). No core infarct is identified. Reported mismatch volume: 7 mL. Electronically Signed   By: KylKellie SimmeringO.   On: 01/21/2022 19:09   CT ANGIO HEAD NECK W WO CM (CODE STROKE)  Result Date: 01/21/2022 CLINICAL DATA:  Neuro deficit, acute, stroke suspected. EXAM: CT ANGIOGRAPHY HEAD AND NECK CT PERFUSION BRAIN TECHNIQUE: Multidetector CT imaging of the head and neck was performed using the standard protocol during bolus administration of intravenous contrast. Multiplanar CT image reconstructions and MIPs were obtained to evaluate the vascular anatomy. Carotid stenosis measurements (when applicable) are obtained utilizing NASCET criteria, using the distal internal carotid diameter as the denominator. Multiphase CT imaging of the brain was performed following IV bolus contrast injection. Subsequent parametric perfusion maps were calculated using RAPID software. RADIATION DOSE REDUCTION: This exam was performed according to the departmental dose-optimization program which includes automated exposure control, adjustment of the mA and/or kV according to patient size and/or use of iterative reconstruction technique. CONTRAST:  1069m369mNIPAQUE IOHEXOL 350 MG/ML SOLN COMPARISON:  Brain MRI 01/21/2022. Noncontrast head CT performed earlier today  01/21/2022. FINDINGS: CTA NECK FINDINGS Aortic arch: Standard aortic branching. Mild atherosclerotic plaque within the visualized aortic arch and proximal major branch vessels of the neck. Streak and beam hardening artifact arising from a dense right-sided contrast bolus partially obscures the right subclavian artery. Within this limitation, there is no appreciable hemodynamically significant innominate or proximal subclavian artery stenosis. Right carotid system: CCA and ICA patent within the neck without stenosis. Mild atherosclerotic plaque about the carotid bifurcation. Left carotid system: CCA and ICA patent within the neck without stenosis. Minimal atherosclerotic plaque about the carotid bifurcation. Vertebral arteries: Vertebral arteries codominant and patent within the neck without stenosis. Nonstenotic atherosclerotic plaque at the origin of the right vertebral artery. Skeleton: Cervical spondylosis. No acute fracture or aggressive osseous lesion. Other neck: No neck mass or cervical lymphadenopathy. Upper chest: No consolidation within the imaged lung apices. Review of the MIP images confirms the above findings CTA HEAD FINDINGS Anterior circulation: The intracranial internal carotid arteries are patent. The M1 middle cerebral arteries are patent. Occluded mid M2 left MCA vessel (with distal reconstitution, likely due to collateral flow) (for instance as seen on series 15, image 15). The anterior cerebral arteries are patent. No intracranial aneurysm is identified. Posterior circulation: The intracranial vertebral arteries are patent. The basilar artery is patent. The posterior cerebral arteries are patent. Posterior communicating arteries are diminutive or absent, bilaterally. Venous sinuses: Within the limitations of contrast timing, no convincing thrombus. Anatomic variants: As described. Review of the MIP images confirms the above findings CT Brain Perfusion Findings: CBF (<30%) Volume: 69mL 69mfusion  (Tmax>6.0s) volume: 7mL M44match Volume:  60m Infarction Location:None identified CTA head impression and CT perfusion head impression called by telephone at the time of interpretation on 01/21/2022 at 6:50 pm to [facility], who verbally acknowledged these results. IMPRESSION: CTA neck: 1. The common carotid, internal carotid and vertebral arteries are patent within the neck without stenosis. Minimal non-stenotic atherosclerotic plaque about the carotid bifurcations and at the origin of the right vertebral artery. 2.  Aortic Atherosclerosis (ICD10-I70.0). CTA head: Occluded mid M2 left MCA vessel (with distal reconstitution, likely due to collateral flow). CT perfusion head: The perfusion software identifies a 7 mL region of critically hypoperfused parenchyma within the left insula/frontal lobe (utilizing the Tmax>6 seconds threshold). No core infarct is identified. Reported mismatch volume: 7 mL. Electronically Signed   By: KKellie SimmeringD.O.   On: 01/21/2022 19:09   CT HEAD CODE STROKE WO CONTRAST  Result Date: 01/21/2022 CLINICAL DATA:  Code stroke. Neuro deficit, acute, stroke suspected. EXAM: CT HEAD WITHOUT CONTRAST TECHNIQUE: Contiguous axial images were obtained from the base of the skull through the vertex without intravenous contrast. RADIATION DOSE REDUCTION: This exam was performed according to the departmental dose-optimization program which includes automated exposure control, adjustment of the mA and/or kV according to patient size and/or use of iterative reconstruction technique. COMPARISON:  Same-day brain MRI 01/21/2022. FINDINGS: Brain: No age advanced or lobar predominant parenchymal atrophy. A subcentimeter acute infarct within the left frontal lobe Lozito matter (adjacent to the left lateral ventricle frontal horn) is occult by CT and was better appreciated on the brain MRI performed earlier today. No CT evidence of interval acute infarct. Minimal chronic small-vessel image  changes within the cerebral Hiltunen matter and pons, better appreciated on the prior brain MRI. There is no acute intracranial hemorrhage. No extra-axial fluid collection. No evidence of an intracranial mass. No midline shift. Vascular: No hyperdense vessel. Skull: No fracture or aggressive osseous lesion. Sinuses/Orbits: No mass or acute finding within the imaged orbits. Trace mucosal thickening within the anterior left ethmoid air cells. ASPECTS ([place]roke Program Early CT Score) - Ganglionic level infarction (caudate, lentiform nuclei, internal capsule, insula, M1-M3 cortex): 7 - Supraganglionic infarction (M4-M6 cortex): 3 Total score (0-10 with 10 being normal): 10 These results were communicated to Dr. KLeonel Ramsayat 6:27 pmon 8/29/2023by text page via the AChildren[facility] Countymessaging system. IMPRESSION: A known subcentimeter acute infarct within the left frontal lobe Whittingham matter is occult by CT, and was better appreciated on the brain MRI performed earlier today. No CT evidence of interval acute intracranial abnormality. Minimal chronic small-vessel ischemic changes within the cerebral Vanderlinde matter and pons. Electronically Signed   By: KKellie SimmeringD.O.   On: 01/21/2022 18:27   ECHOCARDIOGRAM COMPLETE  Result Date: 01/21/2022    ECHOCARDIOGRAM REPORT   Patient Name:   Chloe Johnson Date of Exam: 01/21/2022 Medical Rec #:  [id number]   Height:       61.0 in Accession #:    [id number]  Weight:       171.0 lb Date of Birth:  105/20/1957   BSA:          1.767 m Patient Age:    89years     BP:           146/125 mmHg Patient Gender: F            HR:           72 bpm. Exam Location:  Inpatient Procedure: 2D Echo, Color  Doppler and Cardiac Doppler Indications:    Stroke i63.9  History:        Patient has no prior history of Echocardiogram examinations.                 Risk Factors:Hypertension.  Sonographer:    Raquel Sarna Senior RDCS Referring Phys: Shannon  1. Left ventricular ejection fraction, by  estimation, is 45 to 50%. The left ventricle has mildly decreased function. The left ventricle demonstrates regional wall motion abnormalities (apical hypokinesis with out true aneurysm and with presence of an LV thrombus 1.84 X 0.9 X 1.2 cm).  2. The mitral valve is grossly normal. No evidence of mitral valve regurgitation. No evidence of mitral stenosis.  3. The aortic valve is tricuspid. Aortic valve regurgitation is not visualized. No aortic stenosis is present.  4. Right ventricular systolic function is normal. The right ventricular size is normal. Tricuspid regurgitation signal is inadequate for assessing PA pressure.  5. The inferior vena cava is normal in size with greater than 50% respiratory variability, suggesting right atrial pressure of 3 mmHg. Comparison(s): No prior Echocardiogram. 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 Cardiology aware. FINDINGS  Left Ventricle: Left ventricular ejection fraction, by estimation, is 45 to 50%. The left ventricle has mildly decreased function. The left ventricle demonstrates regional wall motion abnormalities. The left ventricular internal cavity size was small. There is no left ventricular hypertrophy. Left ventricular diastolic parameters are consistent with Grade I diastolic dysfunction (impaired relaxation).  LV Wall Scoring: The apical lateral segment, apical septal segment, apical anterior segment, and apical inferior segment are hypokinetic. Right Ventricle: The right ventricular size is normal. No increase in right ventricular wall thickness. Right ventricular systolic function is normal. Tricuspid regurgitation signal is inadequate for assessing PA pressure. Left Atrium: Left atrial size was normal in size. Right Atrium: Right atrial size was normal in size. Pericardium: There is no evidence of pericardial effusion. Mitral Valve: The mitral valve is grossly normal. No evidence of mitral valve regurgitation. No evidence  of mitral valve stenosis. Tricuspid Valve: The tricuspid valve is normal in structure. Tricuspid valve regurgitation is not demonstrated. No evidence of tricuspid stenosis. Aortic Valve: The aortic valve is tricuspid. Aortic valve regurgitation is not visualized. No aortic stenosis is present. Pulmonic Valve: The pulmonic valve was normal in structure. Pulmonic valve regurgitation is not visualized. No evidence of pulmonic stenosis. Aorta: The aortic root and ascending aorta are structurally normal, with no evidence of dilitation. Venous: The inferior vena cava is normal in size with greater than 50% respiratory variability, suggesting right atrial pressure of 3 mmHg. IAS/Shunts: No atrial level shunt detected by color flow Doppler.  LEFT VENTRICLE PLAX 2D LVIDd:         3.60 cm     Diastology LVIDs:         2.50 cm     LV e' medial:    6.68 cm/s LV PW:         0.80 cm     LV E/e' medial:  6.5 LV IVS:        0.80 cm     LV e' lateral:   11.00 cm/s LVOT diam:     1.70 cm     LV E/e' lateral: 3.9 LV SV:         42 LV SV Index:   24 LVOT Area:     2.27 cm  LV Volumes (MOD) LV vol d, MOD A2C:  70.1 ml LV vol d, MOD A4C: 46.6 ml LV vol s, MOD A2C: 37.3 ml LV vol s, MOD A4C: 23.3 ml LV SV MOD A2C:     32.8 ml LV SV MOD A4C:     46.6 ml LV SV MOD BP:      28.7 ml RIGHT VENTRICLE RV S prime:     9.32 cm/s TAPSE (M-mode): 1.7 cm LEFT ATRIUM             Index        RIGHT ATRIUM          Index LA diam:        2.70 cm 1.53 cm/m   RA Area:     9.52 cm LA Vol (A2C):   28.1 ml 15.90 ml/m  RA Volume:   20.50 ml 11.60 ml/m LA Vol (A4C):   26.3 ml 14.88 ml/m LA Biplane Vol: 28.8 ml 16.30 ml/m  AORTIC VALVE LVOT Vmax:   115.00 cm/s LVOT Vmean:  68.700 cm/s LVOT VTI:    0.185 m  AORTA Ao Root diam: 2.90 cm Ao Asc diam:  3.20 cm MITRAL VALVE MV Area (PHT): 3.11 cm    SHUNTS MV Decel Time: 244 msec    Systemic VTI:  0.18 m MV E velocity: 43.20 cm/s  Systemic Diam: 1.70 cm MV A velocity: 64.50 cm/s MV E/A ratio:  0.67 Rudean Haskell MD Electronically signed by Rudean Haskell MD Signature Date/Time: 01/21/2022/4:04:02 PM    Final    MR BRAIN WO CONTRAST  Result Date: 01/21/2022 CLINICAL DATA:  TIA.  Sudden onset of aphasia, now resolved. EXAM: MRI HEAD WITHOUT CONTRAST TECHNIQUE: Multiplanar, multiecho pulse sequences of the brain and surrounding structures were obtained without intravenous contrast. COMPARISON:  None Available. FINDINGS: Brain: Small acute infarct in the left frontal Folger matter. No hemorrhage, hydrocephalus, mass, or atrophy. Minimal chronic small vessel changes may be present in the pons. No prior infarct is seen. Vascular: Major flow voids are preserved Skull and upper cervical spine: Normal marrow signal. Degenerative facet spurring where covered in the cervical spine with mild C2-3 anterolisthesis. Sinuses/Orbits: Negative IMPRESSION: Small acute infarct in the left frontal Puccini matter. Electronically Signed   By: Jorje Guild M.D.   On: 01/21/2022 12:32      HISTORY OF PRESENT ILLNESS Chloe Johnson is a 66 y.o. 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 At approximately 12pm on Saturday she noted a sudden onset of dizziness, nausea, vomiting, and diarrhea with a sudden sharp onset of chest pain with radiation to her back. She also dropped two plates stating that she had no control of her upper extremities. She attributed this to her cervical radiculopathy. This improved throughout the day and then on Sunday she noted some word finding difficulties which fluctuated throughout the day. In the ED she had some tangential speech and word finding difficulties that worsened at 6pm as well as facial droop on 8/29. NIHSS from 2 to 6. She was taken for a mechanical thrombectomy on 8/29 and had a TICI2c  revascularization of Distal M2/proximal M3.     HOSPITAL COURSE Stroke:  acute punctate infarcts in the left external capsule, insula, and frontal lobe cortex secondary to left M2/M3 occlusion s/p TICI2c revascularization Etiology: MI with LV apical thrombus formation and embolization Code Stroke CT head No acute abnormality. Small vessel disease. Atrophy.  CTA head & neck Occluded mid M2 left MCA vessel  CT perfusion CBF 0, perfusion 33ml, mismatch 48ml Post IR CT No hemorrhage identified MRI - Small acute infarct in the left frontal Dault matter. 8/30 MRI- new punctate acute infarcts in the left external capsule, insula, and frontal lobe cortex, and unchanged additional punctate infarct in the left frontal lobe Rasor matter. 2D Echo 45-50%, LV thrombus  LDL 150 HgbA1c 5.0 VTE prophylaxis - SCDs  No antithrombotic prior to admission, was on heparin IV and ASA81. Transitioned to eliquis prior to discharge per cardiology. Cardiology will determine duration of eliquis. Follow-ups scheduled.   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 Defer to cardiology re duration of eliquis.  Coronary CTA minimal obstructive CAD. Apical LV thrombus.  Troponin peak 377 -> 235 Echo showed apical hypokinesis and a left ventricular thrombus Started entresto Follow-up with cardiology as below   Hypertension Home meds: lisinopril-HCTZ BP 140s last night  SBP goal 120-140 At discharge, started entresto, do not restart lisinopril-HCTZ    Hyperlipidemia Home meds:  None LDL 150, goal < 70 Add Crestor $RemoveBef'20mg'DbIvirDRbb$  Continue statin at discharge   Other Stroke Risk Factors ETOH use, alcohol level <10, advised to drink no  more than 1 drink(s) a day Substance abuse - UDS:  THC POSITIVE, Cocaine NONE DETECTED. Patient advised to stop using due to stroke risk.  DISCHARGE EXAM Blood pressure 121/81, pulse 60, temperature 97.8 F (36.6 C), temperature source Axillary, resp. rate 15, height $RemoveBe'5\' 1"'PnRVODmzs$  (1.549 m), weight 63.8 kg, SpO2 100 %. Physical Exam  Constitutional: Appears well-developed and well-nourished pleasant middle-age Caucasian lady Cardiovascular: Normal rate and regular rhythm.  Respiratory: Effort normal, non-labored breathing   Neuro: Mental Status: Patient is awake, alert, oriented to person, place, month, year, and situation Patient is able to give a clear and coherent history. No signs of aphasia or neglect Cranial Nerves: II: Visual Fields are full. Pupils are equal, round, and reactive to light.   III,IV, VI: EOMI without ptosis or diploplia.  V: Facial sensation is symmetric to temperature VII: Facial movement is symmetric resting and smiling VIII: Hearing is intact to voice X: Palate elevates symmetrically XI: Shoulder shrug is symmetric. XII: Tongue protrudes midline without atrophy or fasciculations.  Motor: Tone is normal. Bulk is normal. 5/5 strength was present in all four extremities.  Sensory: Sensation is symmetric to light touch and temperature in the arms and legs. No extinction to DSS present.  Deep Tendon Reflexes: 2+ and symmetric in the biceps and patellae.  Plantars: Toes are downgoing bilaterally.  Cerebellar: FNF and HKS are intact bilaterally  Discharge Diet       Diet   Diet Heart Room service appropriate? Yes with Assist; Fluid consistency: Thin   liquids  DISCHARGE PLAN Disposition:  Home Eliquis $RemoveBefo'5mg'llKpDBTsCHs$  BID for LV thrombus until follow-up with cardiology  Ongoing stroke risk factor control by Primary Care Physician at time of discharge Follow-up PCP Lavone Orn, MD in 2 weeks. Follow-up in [facility] in 8 weeks, call  office to schedule an appointment.  Follow-up cardiology Dr. Acie Fredrickson, 9/22, echo 10/13, office visit 10/16 or 17  35 minutes were spent preparing discharge.  Rolanda Lundborg, MD, PGY-1  I have personally obtained history,examined this patient, reviewed notes, independently viewed imaging studies, participated in medical decision making and plan of care.ROS completed by me personally and pertinent positives fully documented  I have made any additions or clarifications directly to the above note. Agree with note above.    Antony Contras, MD Medical Director [facility] Pager: [phone] 01/24/2022 7:45 PM

## 2022-01-23 NOTE — Evaluation (Signed)
Speech Language Pathology Evaluation Patient Details Name: Chloe Johnson MRN: [id number] DOB: [birth year] Today's Date: 01/23/2022 Time: 0935-1006 SLP Time Calculation (min) (ACUTE ONLY): 31 min  Problem List:  Patient Active Problem List   Diagnosis Date Noted   CVA (cerebral vascular accident) (HCC) 01/21/2022   NSTEMI (non-ST elevated myocardial infarction) (HCC) 01/21/2022   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   HFrEF (heart failure with reduced ejection fraction) (HCC) 01/21/2022   Hyperlipidemia 01/21/2022   Lumbar stenosis 01/21/2022   Stroke (cerebrum) (HCC) 01/21/2022   S/P lumbar spinal fusion 11/15/2014   Lumbar discitis 11/29/2013   Discitis 11/24/2013   Diarrhea 11/17/2013   Osteomyelitis of lumbar spine (HCC) 11/16/2013   Hypertension    Asthma    Anxiety    Past Medical History:  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/16/2013   L3-4/notes 11/24/2013, arms, neck   Exertional asthma    GERD (gastroesophageal reflux disease)    Hepatitis    Hypertension    Kidney stones    "have always passed them"   Neck pain    Osteomyelitis (HCC) 11/2013   osteomyelitis, discitis    Sleep concern    uses Prozac for sleep    Past Surgical History:  Past Surgical History:  Procedure Laterality Date   BREAST CYST EXCISION Left 2010   "polypectomy"   IR CT HEAD LTD  01/21/2022   IR PERCUTANEOUS ART THROMBECTOMY/INFUSION INTRACRANIAL INC DIAG ANGIO  01/21/2022   IR US GUIDE VASC ACCESS RIGHT  01/22/2022   PICC LINE PLACE PERIPHERAL ([facility] HX) Right    for use of Levaquin & Vancomycin, of note: she reports that she had a "flulike feeling"    RADIOLOGY WITH ANESTHESIA N/A 01/21/2022   Procedure: IR WITH ANESTHESIA;  Surgeon: Julieanne Cotton, MD;  Location: [facility] OR;  Service: Radiology;  Laterality: N/A;   TONSILLECTOMY AND ADENOIDECTOMY  1975   TUBAL LIGATION  1999   HPI:  Pt is a 66 y.o. female who presented  with sudden weakness and inability to speak. MRI brain 8/30: New punctate acute infarcts in the left external capsule, insula, and frontal lobe cortex, and unchanged additional punctate infarct  in the left frontal lobe [place] matter. PMH: hypertension, cervical radiculopathy, hepatitis, osteomyelitis/discitis, lumbar stenosis with foot drop anxiety, GERD, insomnia.   Assessment / Plan / Recommendation Clinical Impression  Pt participated in speech-language-cognition evaluation with her husband and daughter present. 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 Per the pt and her family, she typically has difficulty with memory and requires additional processing time for some tasks, but compensates well for these. Pt stated that her language is very close back to baseline with very occasional word retrieval difficulty. Pt's language was assessed informally and with subtests of the Quick Aphasia Battery without appreciation of any deficits in speech/language. The [facility] Mental Status Examination was completed to evaluate the pt's cognitive-linguistic skills. She achieved a score of 19/30 which is below the normal limits of 27 or more out of 30. She exhibited difficulty in the areas of attention, memory, and executive function. Pt's performance was discussed with pt and her family who advised that her presentation today was representative of her baseline. Acute skilled SLP services will therefore be discontinued at this time, but pt and her family have  agreed to follow up with the pt's MD if they subsequently notice changes in cognition when she returns to more cognitively-challenging tasks.    SLP Assessment  SLP Recommendation/Assessment: Patient does not need any further Speech Lanaguage Pathology Services SLP Visit Diagnosis: Cognitive communication deficit (R41.841)    Recommendations for follow up therapy are one component  of a multi-disciplinary discharge planning process, led by the attending physician.  Recommendations may be updated based on patient status, additional functional criteria and insurance authorization.    Follow Up Recommendations  No SLP follow up    Assistance Recommended at Discharge     Functional Status Assessment Patient has not had a recent decline in their functional status  Frequency and Duration           SLP Evaluation Cognition  Overall Cognitive Status: History of cognitive impairments - at baseline Arousal/Alertness: Awake/alert Orientation Level: Oriented X4 Year: 2023 Month: August Day of Week: Correct Attention: Focused;Sustained Focused Attention: Appears intact Sustained Attention: Impaired Sustained Attention Impairment: Verbal complex Memory: Impaired Memory Impairment:  (Immediate: 5/5; delayed: 2/5) Awareness: Appears intact Problem Solving: Impaired Problem Solving Impairment: Verbal complex (Money: 1/3; time: 1/1) Executive Function: Sequencing;Organizing Sequencing: Impaired Sequencing Impairment: Verbal complex (clock: 2/4) Organizing: Appears intact (backward digit span: 2/2)       Comprehension  Auditory Comprehension Overall Auditory Comprehension: Appears within functional limits for tasks assessed Yes/No Questions: Within Functional Limits Paragraph Comprehension (via yes/no questions):  (6/8) Commands: Within Functional Limits    Expression Expression Primary Mode of Expression: Verbal Verbal Expression Overall Verbal Expression: Appears within functional limits for tasks assessed Initiation: No impairment Automatic Speech: Counting;Day of week;Month of year (WNL) Level of Generative/Spontaneous Verbalization: Conversation Repetition: No impairment Naming: No impairment   Oral / Motor  Oral Motor/Sensory Function Overall Oral Motor/Sensory Function: Within functional limits Motor Speech Overall Motor Speech: Appears within  functional limits for tasks assessed Respiration: Within functional limits Phonation: Normal Resonance: Within functional limits Articulation: Within functional limitis Intelligibility: Intelligible Motor Planning: Witnin functional limits Motor Speech Errors: Not applicable           Dallin Mccorkel I. Vear Clock, MS, CCC-SLP Acute Rehabilitation Services Office number [phone]  Scheryl Marten 01/23/2022, 10:20 AM

## 2022-01-23 NOTE — Progress Notes (Signed)
Rounding Note    Patient Name: Chloe Johnson Date of Encounter: 01/23/2022  [place] Cardiologist: Breigh Annett   Subjective   66 year old female who presents with 3 to 4-day history of left shoulder and arm pain accompanied by nausea.  Several days later she had a stroke.   Echocardiogram yesterday reveals the presence of anterior apical akinesis with an LV apical thrombus.  It appears that she had anteroapical myocardial infarction 3 to 4 days ago complicated by mural thrombus formation and then later embolic event which caused her stroke.  She had urgent cerebral angiogram followed by mechanical thrombectomy last night.  She is doing quite a bit better today.  She has her 18 gauge IV Ready for coronary CTA      Inpatient Medications    Scheduled Meds:  aspirin  81 mg Oral Daily   Chlorhexidine Gluconate Cloth  6 each Topical Q0600   ivabradine  15 mg Oral Once   loratadine  10 mg Oral BH-q7a   melatonin  3 mg Oral QHS   mirabegron ER  25 mg Oral Daily   rosuvastatin  40 mg Oral Daily   Continuous Infusions:  clevidipine Stopped (01/22/22 0017)   heparin 900 Units/hr (01/23/22 0700)   PRN Meds: acetaminophen **OR** acetaminophen (TYLENOL) oral liquid 160 mg/5 mL **OR** acetaminophen, acetaminophen **OR** acetaminophen (TYLENOL) oral liquid 160 mg/5 mL **OR** acetaminophen, ALPRAZolam, cyclobenzaprine, guaiFENesin, mouth rinse, senna-docusate   Vital Signs    Vitals:   01/23/22 0500 01/23/22 0600 01/23/22 0700 01/23/22 0800  BP: 112/76 124/72 130/73 123/82  Pulse: 66 68 71 69  Resp: _0 Temp:    98.3 F (36.8 C)  TempSrc:    Oral  SpO2: 97% 99% 99% 98%  Weight:      Height:        Intake/Output Summary (Last 24 hours) at 01/23/2022 0833 Last data filed at 01/23/2022 0700 Gross per 24 hour  Intake 736.13 ml  Output --  Net 736.13 ml       01/21/2022    9:00 PM 01/21/2022   10:58 AM 11/15/2014    8:12 AM  Last 3 Weights  Weight (lbs)  140 lb 10.5 oz 171 lb 171 lb 12 oz  Weight (kg) 63.8 kg 77.565 kg 77.905 kg      Telemetry    NSR  - Personally Reviewed  ECG     - Personally Reviewed  Physical Exam   Physical Exam: Blood pressure 123/82, pulse 69, temperature 98.3 F (36.8 C), temperature source Oral, resp. rate 15, height _1  (1.549 m), weight 63.8 kg, SpO2 98 %.       GEN:  Well nourished, well developed in no acute distress HEENT: Normal NECK: No JVD; No carotid bruits LYMPHATICS: No lymphadenopathy CARDIAC: RRR   RESPIRATORY:  Clear to auscultation without rales, wheezing or rhonchi  ABDOMEN: Soft, non-tender, non-distended MUSCULOSKELETAL:  No edema; No deformity  SKIN: Warm and dry NEUROLOGIC:  Alert and oriented x 3   Labs    High Sensitivity Troponin:   Recent Labs  Lab 01/21/22 1102 01/21/22 1309 01/22/22 0827  TROPONINIHS 341* 377* 235*      Chemistry Recent Labs  Lab 01/21/22 1445 01/22/22 0827  NA 138 139  K 3.7 4.3  CL 105 107  CO2 22 20*  GLUCOSE 94 113*  BUN 18 15  CREATININE 0.82 0.87  CALCIUM 10.0 8.6*  MG 2.1  --   PROT 6.8  --  ALBUMIN 4.1  --   AST 19  --   ALT 16  --   ALKPHOS 47  --   BILITOT 1.0  --   GFRNONAA >60 >60  ANIONGAP 11 12     Lipids  Recent Labs  Lab 01/22/22 0351  CHOL 223*  TRIG 164*  HDL 40*  LDLCALC 150*  CHOLHDL 5.6     Hematology Recent Labs  Lab 01/21/22 1445 01/22/22 0351  WBC 4.9 4.9  RBC 4.35 4.11  HGB 12.5 12.0  HCT 37.2 35.8*  MCV 85.5 87.1  MCH 28.7 29.2  MCHC 33.6 33.5  RDW 11.6 11.9  PLT 175 187    Thyroid No results for input(s): "TSH", "FREET4" in the last 168 hours.  BNPNo results for input(s): "BNP", "PROBNP" in the last 168 hours.  DDimer No results for input(s): "DDIMER" in the last 168 hours.   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 She initially presented on 01/21/2022 with transient episode of aphasia followed sudden onset dizziness, nausea, vomiting, chest pain that radiated to upper back on Saturday. MRI brain showed small ischemic infarct of the frontal [place] matter on the left. Her troponins were elevated to over 300 and her echocardiogram showed apical hypokinesis and a left ventricular thrombus consistent with NSTEMI. Around 6 p.m. on 01/21/2022, patient developed worsening aphasia, NIHSS 6. Head CT was unremarkable. CT angiogram of the head and neck showed a left M2/M3-MCA occlusion. She was then transferred to our service for an emergency mechanical thrombectomy. EXAM: ULTRASOUND-GUIDED VASCULAR ACCESS DIAGNOSTIC CEREBRAL ANGIOGRAM MECHANICAL THROMBECTOMY FLAT PANEL HEAD CT COMPARISON:  CT/CT angiogram of the head and neck January 22, 2019 MEDICATIONS: No antibiotic was administered. ANESTHESIA/SEDATION: The procedure was performed under general anesthesia. CONTRAST:  75 mL of Omnipaque 300 milligram/mL. FLUOROSCOPY: Radiation Exposure Index (as provided by the fluoroscopic device): 469.6 mGy Kerma COMPLICATIONS: None immediate. TECHNIQUE: Informed written consent was obtained from the patient after a thorough discussion of the procedural risks, benefits and alternatives. All questions were addressed. Maximal Sterile Barrier Technique was utilized including caps, mask, sterile gowns, sterile gloves, sterile drape, hand hygiene and skin antiseptic. A timeout was performed prior to the initiation of the procedure. The right groin was prepped and draped in the usual sterile fashion. Using a micropuncture kit and the modified Seldinger technique, access was gained to the right common femoral artery and an 8 French sheath was  placed. Real-time ultrasound guidance was utilized for vascular access including the acquisition of a permanent ultrasound image documenting patency of the accessed vessel. Under fluoroscopy, a Zoom 88 guide catheter was navigated over a 6 [place] VTK catheter and a 0.035" Terumo Glidewire into the aortic arch. The catheter was placed into the left common carotid artery and then advanced into the left internal carotid artery. The diagnostic catheter was removed. Frontal and lateral angiograms of the head were obtained. FINDINGS: 1. Normal caliber of the right common femoral artery, adequate for vascular access. 2. There is no occlusion of the distal left M2/MCA superior division branch extending to the M3 segment. PROCEDURE: Using biplane roadmap, a Vect 46 aspiration catheter was navigated over an Aristotle 24 microguidewire into the cavernous segment of the left ICA. The aspiration catheter was then advanced  over the wire to the level of occlusion in the M2-M3 segment and connected to an aspiration pump. Continuous aspiration was performed for 2 minutes. The guide catheter was connected to a VacLok syringe. The aspiration catheter was subsequently removed under constant aspiration. The guide catheter was aspirated for debris. Left internal carotid artery angiograms with magnified frontal and lateral views of the head showed recanalization of the left MCA with slow flow in a few distal cortical branches (TICI 2C). No embolus to new territory. Flat panel CT of the head was obtained and post processed in a separate workstation with concurrent attending physician supervision. Selected images were sent to PACS. No evidence of hemorrhagic complication. Delayed left internal carotid artery angiogram showed persistent patency of the left MCA. Right common femoral artery angiogram was obtained in right anterior oblique view. The puncture is at the level of the common femoral artery. The artery has normal caliber, adequate  for closure device. The sheath was exchanged over the wire for a Perclose prostyle which was utilized for access closure. Immediate hemostasis was achieved. IMPRESSION: 1. Successful mechanical thrombectomy for treatment of a distal left M2/MCA occlusion with direct contact aspiration achieving complete recanalization (TICI 2C). 2. No thromboembolic or hemorrhagic complication. PLAN: Transfer to ICU for continued post stroke care. Electronically Signed   By: Pedro Earls M.D.   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 She initially presented on 01/21/2022 with transient episode of aphasia followed sudden onset dizziness, nausea, vomiting, chest pain that radiated to upper back on Saturday. MRI brain showed small ischemic infarct of the frontal [place] matter on the left. Her troponins were elevated to over 300 and her echocardiogram showed apical hypokinesis and a left ventricular thrombus consistent with NSTEMI. Around 6 p.m. on 01/21/2022, patient developed worsening aphasia, NIHSS 6. Head CT was unremarkable. CT angiogram of the head and neck showed a left M2/M3-MCA occlusion. She was then transferred to our service for an emergency mechanical thrombectomy. EXAM: ULTRASOUND-GUIDED VASCULAR ACCESS DIAGNOSTIC CEREBRAL ANGIOGRAM MECHANICAL THROMBECTOMY FLAT PANEL HEAD CT COMPARISON:  CT/CT angiogram of the head and neck January 22, 2019 MEDICATIONS: No antibiotic was administered. ANESTHESIA/SEDATION: The procedure was performed under general anesthesia. CONTRAST:  75 mL of Omnipaque 300 milligram/mL. FLUOROSCOPY: Radiation Exposure Index (as provided by the fluoroscopic device): 563.8  mGy Kerma COMPLICATIONS: None immediate. TECHNIQUE: Informed written consent was obtained from the patient after a thorough discussion of the procedural risks, benefits and alternatives. All questions were addressed. Maximal Sterile Barrier Technique was utilized including caps, mask, sterile gowns, sterile gloves, sterile drape, hand hygiene and skin antiseptic. A timeout was performed prior to the initiation of the procedure. The right groin was prepped and draped in the usual sterile fashion. Using a micropuncture kit and the modified Seldinger technique, access was gained to the right common femoral artery and an 8 French sheath was placed. Real-time ultrasound guidance was utilized for vascular access including the acquisition of a permanent ultrasound image documenting patency of the accessed vessel. Under fluoroscopy, a Zoom 88 guide catheter was navigated over a 6 [place] VTK catheter and a 0.035" Terumo Glidewire into the aortic arch. The catheter was placed into the left common carotid artery and then advanced into the left internal carotid artery.  The diagnostic catheter was removed. Frontal and lateral angiograms of the head were obtained. FINDINGS: 1. Normal caliber of the right common femoral artery, adequate for vascular access. 2. There is no occlusion of the distal left M2/MCA superior division branch extending to the M3 segment. PROCEDURE: Using biplane roadmap, a Vect 46 aspiration catheter was navigated over an Aristotle 24 microguidewire into the cavernous segment of the left ICA. The aspiration catheter was then advanced over the wire to the level of occlusion in the M2-M3 segment and connected to an aspiration pump. Continuous aspiration was performed for 2 minutes. The guide catheter was connected to a VacLok syringe. The aspiration catheter was subsequently removed under constant aspiration. The guide catheter was aspirated for debris. Left internal carotid artery angiograms with magnified  frontal and lateral views of the head showed recanalization of the left MCA with slow flow in a few distal cortical branches (TICI 2C). No embolus to new territory. Flat panel CT of the head was obtained and post processed in a separate workstation with concurrent attending physician supervision. Selected images were sent to PACS. No evidence of hemorrhagic complication. Delayed left internal carotid artery angiogram showed persistent patency of the left MCA. Right common femoral artery angiogram was obtained in right anterior oblique view. The puncture is at the level of the common femoral artery. The artery has normal caliber, adequate for closure device. The sheath was exchanged over the wire for a Perclose prostyle which was utilized for access closure. Immediate hemostasis was achieved. IMPRESSION: 1. Successful mechanical thrombectomy for treatment of a distal left M2/MCA occlusion with direct contact aspiration achieving complete recanalization (TICI 2C). 2. No thromboembolic or hemorrhagic complication. PLAN: Transfer to ICU for continued post stroke care. Electronically Signed   By: Pedro Earls M.D.   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 She initially presented on 01/21/2022 with transient episode of aphasia followed sudden onset dizziness, nausea, vomiting, chest pain that radiated to upper back on Saturday. MRI brain showed small ischemic infarct of the frontal [place] matter on the left. Her troponins were elevated to over 300 and her echocardiogram showed apical hypokinesis and a left ventricular thrombus consistent with NSTEMI. Around 6 p.m. on  01/21/2022, patient developed worsening aphasia, NIHSS 6. Head CT was unremarkable. CT angiogram of the head and neck showed a left M2/M3-MCA occlusion. She was then transferred to our service for an emergency mechanical thrombectomy. EXAM: ULTRASOUND-GUIDED VASCULAR ACCESS DIAGNOSTIC CEREBRAL ANGIOGRAM MECHANICAL THROMBECTOMY FLAT PANEL HEAD CT COMPARISON:  CT/CT angiogram of the head and neck January 22, 2019 MEDICATIONS: No antibiotic was administered. ANESTHESIA/SEDATION: The procedure was performed under general anesthesia. CONTRAST:  75 mL of Omnipaque 300 milligram/mL. FLUOROSCOPY: Radiation Exposure Index (as provided by the fluoroscopic device): 741.2 mGy Kerma COMPLICATIONS: None immediate. TECHNIQUE: Informed written consent was obtained from the patient after a thorough discussion of the procedural risks, benefits and alternatives. All questions were addressed. Maximal Sterile Barrier Technique was utilized including caps, mask, sterile gowns, sterile gloves, sterile drape, hand hygiene and skin antiseptic. A timeout was performed prior to the initiation of the procedure. The right groin was prepped and draped in the usual sterile fashion. Using a micropuncture kit and the modified Seldinger technique, access was gained  to the right common femoral artery and an 8 French sheath was placed. Real-time ultrasound guidance was utilized for vascular access including the acquisition of a permanent ultrasound image documenting patency of the accessed vessel. Under fluoroscopy, a Zoom 88 guide catheter was navigated over a 6 [place] VTK catheter and a 0.035" Terumo Glidewire into the aortic arch. The catheter was placed into the left common carotid artery and then advanced into the left internal carotid artery. The diagnostic catheter was removed. Frontal and lateral angiograms of the head were obtained. FINDINGS: 1. Normal caliber of the right common femoral artery, adequate for vascular access. 2. There is no  occlusion of the distal left M2/MCA superior division branch extending to the M3 segment. PROCEDURE: Using biplane roadmap, a Vect 46 aspiration catheter was navigated over an Aristotle 24 microguidewire into the cavernous segment of the left ICA. The aspiration catheter was then advanced over the wire to the level of occlusion in the M2-M3 segment and connected to an aspiration pump. Continuous aspiration was performed for 2 minutes. The guide catheter was connected to a VacLok syringe. The aspiration catheter was subsequently removed under constant aspiration. The guide catheter was aspirated for debris. Left internal carotid artery angiograms with magnified frontal and lateral views of the head showed recanalization of the left MCA with slow flow in a few distal cortical branches (TICI 2C). No embolus to new territory. Flat panel CT of the head was obtained and post processed in a separate workstation with concurrent attending physician supervision. Selected images were sent to PACS. No evidence of hemorrhagic complication. Delayed left internal carotid artery angiogram showed persistent patency of the left MCA. Right common femoral artery angiogram was obtained in right anterior oblique view. The puncture is at the level of the common femoral artery. The artery has normal caliber, adequate for closure device. The sheath was exchanged over the wire for a Perclose prostyle which was utilized for access closure. Immediate hemostasis was achieved. IMPRESSION: 1. Successful mechanical thrombectomy for treatment of a distal left M2/MCA occlusion with direct contact aspiration achieving complete recanalization (TICI 2C). 2. No thromboembolic or hemorrhagic complication. PLAN: Transfer to ICU for continued post stroke care. Electronically Signed   By: Pedro Earls M.D.   On: 01/22/2022 14:25   MR BRAIN WO CONTRAST  Result Date: 01/22/2022 CLINICAL DATA:  Follow-up stroke EXAM: MRI HEAD WITHOUT  CONTRAST TECHNIQUE: Multiplanar, multiecho pulse sequences of the brain and surrounding structures were obtained without intravenous contrast. COMPARISON:  Brain MRI and CT/CTA head and neck 1 day prior FINDINGS: Brain: A punctate fusion of diffusion restriction in the left frontal lobe periventricular [place] matter is unchanged compared to the study from 1 day prior. Additional small foci of diffusion restriction in the left external capsule, insula, and frontal lobe cortex are new. There is no associated hemorrhage or mass effect. Parenchymal volume is normal. The ventricles are normal in size. Parenchymal signal is otherwise normal, with no significant burden of under vitamin chronic small vessel ischemic change. There is no mass lesion.  There is no mass effect or midline shift. Vascular: Normal flow voids. Skull and upper cervical spine: Normal marrow signal. Sinuses/Orbits: The paranasal sinuses are clear. The globes and orbits are unremarkable. Other: None. IMPRESSION: New punctate acute infarcts in the left external capsule, insula, and frontal lobe cortex, and unchanged additional punctate infarct in the left frontal lobe [place] matter. Electronically Signed   By: Valetta Mole M.D.   On: 01/22/2022 11:39  CT CEREBRAL PERFUSION W CONTRAST  Result Date: 01/21/2022 CLINICAL DATA:  Neuro deficit, acute, stroke suspected. EXAM: CT ANGIOGRAPHY HEAD AND NECK CT PERFUSION BRAIN TECHNIQUE: Multidetector CT imaging of the head and neck was performed using the standard protocol during bolus administration of intravenous contrast. Multiplanar CT image reconstructions and MIPs were obtained to evaluate the vascular anatomy. Carotid stenosis measurements (when applicable) are obtained utilizing NASCET criteria, using the distal internal carotid diameter as the denominator. Multiphase CT imaging of the brain was performed following IV bolus contrast injection. Subsequent parametric perfusion maps were calculated using  RAPID software. RADIATION DOSE REDUCTION: This exam was performed according to the departmental dose-optimization program which includes automated exposure control, adjustment of the mA and/or kV according to patient size and/or use of iterative reconstruction technique. CONTRAST:  121m OMNIPAQUE IOHEXOL 350 MG/ML SOLN COMPARISON:  Brain MRI 01/21/2022. Noncontrast head CT performed earlier today 01/21/2022. FINDINGS: CTA NECK FINDINGS Aortic arch: Standard aortic branching. Mild atherosclerotic plaque within the visualized aortic arch and proximal major branch vessels of the neck. Streak and beam hardening artifact arising from a dense right-sided contrast bolus partially obscures the right subclavian artery. Within this limitation, there is no appreciable hemodynamically significant innominate or proximal subclavian artery stenosis. Right carotid system: CCA and ICA patent within the neck without stenosis. Mild atherosclerotic plaque about the carotid bifurcation. Left carotid system: CCA and ICA patent within the neck without stenosis. Minimal atherosclerotic plaque about the carotid bifurcation. Vertebral arteries: Vertebral arteries codominant and patent within the neck without stenosis. Nonstenotic atherosclerotic plaque at the origin of the right vertebral artery. Skeleton: Cervical spondylosis. No acute fracture or aggressive osseous lesion. Other neck: No neck mass or cervical lymphadenopathy. Upper chest: No consolidation within the imaged lung apices. Review of the MIP images confirms the above findings CTA HEAD FINDINGS Anterior circulation: The intracranial internal carotid arteries are patent. The M1 middle cerebral arteries are patent. Occluded mid M2 left MCA vessel (with distal reconstitution, likely due to collateral flow) (for instance as seen on series 15, image 15). The anterior cerebral arteries are patent. No intracranial aneurysm is identified. Posterior circulation: The intracranial  vertebral arteries are patent. The basilar artery is patent. The posterior cerebral arteries are patent. Posterior communicating arteries are diminutive or absent, bilaterally. Venous sinuses: Within the limitations of contrast timing, no convincing thrombus. Anatomic variants: As described. Review of the MIP images confirms the above findings CT Brain Perfusion Findings: CBF (<30%) Volume: 053mPerfusion (Tmax>6.0s) volume: 70m55mismatch Volume: 0mL63mfarction Location:None identified CTA head impression and CT perfusion head impression called by telephone at the time of interpretation on 01/21/2022 at 6:50 pm to provider [facility] verb[facility] acknowledged these results. IMPRESSION: CTA neck: 1. The common carotid, internal carotid and vertebral arteries are patent within the neck without stenosis. Minimal non-stenotic atherosclerotic plaque about the carotid bifurcations and at the origin of the right vertebral artery. 2.  Aortic Atherosclerosis (ICD10-I70.0). CTA head: Occluded mid M2 left MCA vessel (with distal reconstitution, likely due to collateral flow). CT perfusion head: The perfusion software identifies a 7 mL region of critically hypoperfused parenchyma within the left insula/frontal lobe (utilizing the Tmax>6 seconds threshold). No core infarct is identified. Reported mismatch volume: 7 mL. Electronically Signed   By: KyleKellie Simmering.   On: 01/21/2022 19:09   CT ANGIO HEAD NECK W WO CM (CODE STROKE)  Result Date: 01/21/2022 CLINICAL DATA:  Neuro deficit, acute, stroke suspected. EXAM: CT ANGIOGRAPHY HEAD AND NECK  CT PERFUSION BRAIN TECHNIQUE: Multidetector CT imaging of the head and neck was performed using the standard protocol during bolus administration of intravenous contrast. Multiplanar CT image reconstructions and MIPs were obtained to evaluate the vascular anatomy. Carotid stenosis measurements (when applicable) are obtained utilizing NASCET criteria, using the distal internal  carotid diameter as the denominator. Multiphase CT imaging of the brain was performed following IV bolus contrast injection. Subsequent parametric perfusion maps were calculated using RAPID software. RADIATION DOSE REDUCTION: This exam was performed according to the departmental dose-optimization program which includes automated exposure control, adjustment of the mA and/or kV according to patient size and/or use of iterative reconstruction technique. CONTRAST:  129m OMNIPAQUE IOHEXOL 350 MG/ML SOLN COMPARISON:  Brain MRI 01/21/2022. Noncontrast head CT performed earlier today 01/21/2022. FINDINGS: CTA NECK FINDINGS Aortic arch: Standard aortic branching. Mild atherosclerotic plaque within the visualized aortic arch and proximal major branch vessels of the neck. Streak and beam hardening artifact arising from a dense right-sided contrast bolus partially obscures the right subclavian artery. Within this limitation, there is no appreciable hemodynamically significant innominate or proximal subclavian artery stenosis. Right carotid system: CCA and ICA patent within the neck without stenosis. Mild atherosclerotic plaque about the carotid bifurcation. Left carotid system: CCA and ICA patent within the neck without stenosis. Minimal atherosclerotic plaque about the carotid bifurcation. Vertebral arteries: Vertebral arteries codominant and patent within the neck without stenosis. Nonstenotic atherosclerotic plaque at the origin of the right vertebral artery. Skeleton: Cervical spondylosis. No acute fracture or aggressive osseous lesion. Other neck: No neck mass or cervical lymphadenopathy. Upper chest: No consolidation within the imaged lung apices. Review of the MIP images confirms the above findings CTA HEAD FINDINGS Anterior circulation: The intracranial internal carotid arteries are patent. The M1 middle cerebral arteries are patent. Occluded mid M2 left MCA vessel (with distal reconstitution, likely due to collateral  flow) (for instance as seen on series 15, image 15). The anterior cerebral arteries are patent. No intracranial aneurysm is identified. Posterior circulation: The intracranial vertebral arteries are patent. The basilar artery is patent. The posterior cerebral arteries are patent. Posterior communicating arteries are diminutive or absent, bilaterally. Venous sinuses: Within the limitations of contrast timing, no convincing thrombus. Anatomic variants: As described. Review of the MIP images confirms the above findings CT Brain Perfusion Findings: CBF (<30%) Volume: 068mPerfusion (Tmax>6.0s) volume: 43m10mismatch Volume: 0mL71mfarction Location:None identified CTA head impression and CT perfusion head impression called by telephone at the time of interpretation on 01/21/2022 at 6:50 pm to provider [facility] verb[facility] acknowledged these results. IMPRESSION: CTA neck: 1. The common carotid, internal carotid and vertebral arteries are patent within the neck without stenosis. Minimal non-stenotic atherosclerotic plaque about the carotid bifurcations and at the origin of the right vertebral artery. 2.  Aortic Atherosclerosis (ICD10-I70.0). CTA head: Occluded mid M2 left MCA vessel (with distal reconstitution, likely due to collateral flow). CT perfusion head: The perfusion software identifies a 7 mL region of critically hypoperfused parenchyma within the left insula/frontal lobe (utilizing the Tmax>6 seconds threshold). No core infarct is identified. Reported mismatch volume: 7 mL. Electronically Signed   By: KyleKellie Simmering.   On: 01/21/2022 19:09   CT HEAD CODE STROKE WO CONTRAST  Result Date: 01/21/2022 CLINICAL DATA:  Code stroke. Neuro deficit, acute, stroke suspected. EXAM: CT HEAD WITHOUT CONTRAST TECHNIQUE: Contiguous axial images were obtained from the base of the skull through the vertex without intravenous contrast. RADIATION DOSE REDUCTION: This exam was performed  according to the departmental  dose-optimization program which includes automated exposure control, adjustment of the mA and/or kV according to patient size and/or use of iterative reconstruction technique. COMPARISON:  Same-day brain MRI 01/21/2022. FINDINGS: Brain: No age advanced or lobar predominant parenchymal atrophy. A subcentimeter acute infarct within the left frontal lobe Kalt matter (adjacent to the left lateral ventricle frontal horn) is occult by CT and was better appreciated on the brain MRI performed earlier today. No CT evidence of interval acute infarct. Minimal chronic small-vessel image changes within the cerebral [place] matter and pons, better appreciated on the prior brain MRI. There is no acute intracranial hemorrhage. No extra-axial fluid collection. No evidence of an intracranial mass. No midline shift. Vascular: No hyperdense vessel. Skull: No fracture or aggressive osseous lesion. Sinuses/Orbits: No mass or acute finding within the imaged orbits. Trace mucosal thickening within the anterior left ethmoid air cells. ASPECTS ([place] Stroke Program Early CT Score) - Ganglionic level infarction (caudate, lentiform nuclei, internal capsule, insula, M1-M3 cortex): 7 - Supraganglionic infarction (M4-M6 cortex): 3 Total score (0-10 with 10 being normal): 10 These results were communicated to Dr. Leonel Ramsay at 6:27 pmon 8/29/2023by text page via the [facility] messaging system. IMPRESSION: A known subcentimeter acute infarct within the left frontal lobe Hoefer matter is occult by CT, and was better appreciated on the brain MRI performed earlier today. No CT evidence of interval acute intracranial abnormality. Minimal chronic small-vessel ischemic changes within the cerebral Jaskiewicz matter and pons. Electronically Signed   By: Kellie Simmering D.O.   On: 01/21/2022 18:27   ECHOCARDIOGRAM COMPLETE  Result Date: 01/21/2022    ECHOCARDIOGRAM REPORT   Patient Name:   RAYLAN TROIANI Mainer Date of Exam: 01/21/2022 Medical Rec #:  [id number]    Height:        61.0 in Accession #:    [id number]   Weight:       171.0 lb Date of Birth:  December 05, 1955    BSA:          1.767 m Patient Age:    65 years     BP:           146/125 mmHg Patient Gender: F            HR:           72 bpm. Exam Location:  Inpatient Procedure: 2D Echo, Color Doppler and Cardiac Doppler Indications:    Stroke i63.9  History:        Patient has no prior history of Echocardiogram examinations.                 Risk Factors:Hypertension.  Sonographer:    Raquel Sarna Senior RDCS Referring Phys: [place]  1. Left ventricular ejection fraction, by estimation, is 45 to 50%. The left ventricle has mildly decreased function. The left ventricle demonstrates regional wall motion abnormalities (apical hypokinesis with out true aneurysm and with presence of an LV thrombus 1.84 X 0.9 X 1.2 cm).  2. The mitral valve is grossly normal. No evidence of mitral valve regurgitation. No evidence of mitral stenosis.  3. The aortic valve is tricuspid. Aortic valve regurgitation is not visualized. No aortic stenosis is present.  4. Right ventricular systolic function is normal. The right ventricular size is normal. Tricuspid regurgitation signal is inadequate for assessing PA pressure.  5. The inferior vena cava is normal in size with greater than 50% respiratory variability, suggesting right atrial pressure of 3 mmHg. Comparison(s): No prior  Echocardiogram. 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 Cardiology aware. FINDINGS  Left Ventricle: Left ventricular ejection fraction, by estimation, is 45 to 50%. The left ventricle has mildly decreased function. The left ventricle demonstrates regional wall motion abnormalities. The left ventricular internal cavity size was small. There is no left ventricular hypertrophy. Left ventricular diastolic parameters are consistent with Grade I diastolic dysfunction (impaired relaxation).  LV Wall Scoring: The apical lateral segment,  apical septal segment, apical anterior segment, and apical inferior segment are hypokinetic. Right Ventricle: The right ventricular size is normal. No increase in right ventricular wall thickness. Right ventricular systolic function is normal. Tricuspid regurgitation signal is inadequate for assessing PA pressure. Left Atrium: Left atrial size was normal in size. Right Atrium: Right atrial size was normal in size. Pericardium: There is no evidence of pericardial effusion. Mitral Valve: The mitral valve is grossly normal. No evidence of mitral valve regurgitation. No evidence of mitral valve stenosis. Tricuspid Valve: The tricuspid valve is normal in structure. Tricuspid valve regurgitation is not demonstrated. No evidence of tricuspid stenosis. Aortic Valve: The aortic valve is tricuspid. Aortic valve regurgitation is not visualized. No aortic stenosis is present. Pulmonic Valve: The pulmonic valve was normal in structure. Pulmonic valve regurgitation is not visualized. No evidence of pulmonic stenosis. Aorta: The aortic root and ascending aorta are structurally normal, with no evidence of dilitation. Venous: The inferior vena cava is normal in size with greater than 50% respiratory variability, suggesting right atrial pressure of 3 mmHg. IAS/Shunts: No atrial level shunt detected by color flow Doppler.  LEFT VENTRICLE PLAX 2D LVIDd:         3.60 cm     Diastology LVIDs:         2.50 cm     LV e' medial:    6.68 cm/s LV PW:         0.80 cm     LV E/e' medial:  6.5 LV IVS:        0.80 cm     LV e' lateral:   11.00 cm/s LVOT diam:     1.70 cm     LV E/e' lateral: 3.9 LV SV:         42 LV SV Index:   24 LVOT Area:     2.27 cm  LV Volumes (MOD) LV vol d, MOD A2C: 70.1 ml LV vol d, MOD A4C: 46.6 ml LV vol s, MOD A2C: 37.3 ml LV vol s, MOD A4C: 23.3 ml LV SV MOD A2C:     32.8 ml LV SV MOD A4C:     46.6 ml LV SV MOD BP:      28.7 ml RIGHT VENTRICLE RV S prime:     9.32 cm/s TAPSE (M-mode): 1.7 cm LEFT ATRIUM              Index        RIGHT ATRIUM          Index LA diam:        2.70 cm 1.53 cm/m   RA Area:     9.52 cm LA Vol (A2C):   28.1 ml 15.90 ml/m  RA Volume:   20.50 ml 11.60 ml/m LA Vol (A4C):   26.3 ml 14.88 ml/m LA Biplane Vol: 28.8 ml 16.30 ml/m  AORTIC VALVE LVOT Vmax:   115.00 cm/s LVOT Vmean:  68.700 cm/s LVOT VTI:    0.185 m  AORTA Ao Root diam: 2.90 cm Ao Asc diam:  3.20 cm MITRAL VALVE MV Area (PHT): 3.11 cm    SHUNTS MV Decel Time: 244 msec    Systemic VTI:  0.18 m MV E velocity: 43.20 cm/s  Systemic Diam: 1.70 cm MV A velocity: 64.50 cm/s MV E/A ratio:  0.67 Rudean Haskell MD Electronically signed by Rudean Haskell MD Signature Date/Time: 01/21/2022/4:04:02 PM    Final    MR BRAIN WO CONTRAST  Result Date: 01/21/2022 CLINICAL DATA:  TIA.  Sudden onset of aphasia, now resolved. EXAM: MRI HEAD WITHOUT CONTRAST TECHNIQUE: Multiplanar, multiecho pulse sequences of the brain and surrounding structures were obtained without intravenous contrast. COMPARISON:  None Available. FINDINGS: Brain: Small acute infarct in the left frontal [place]man matter. No hemorrhage, hydrocephalus, mass, or atrophy. Minimal chronic small vessel changes may be present in the pons. No prior infarct is seen. Vascular: Major flow voids are preserved Skull and upper cervical spine: Normal marrow signal. Degenerative facet spurring where covered in the cervical spine with mild C2-3 anterolisthesis. Sinuses/Orbits: Negative IMPRESSION: Small acute infarct in the left frontal [place] matter. Electronically Signed   By: Jorje Guild M.D.   On: 01/21/2022 12:32    Cardiac Studies      Patient Profile     66 y.o. female with a recent myocardial infarction complicated by an apical thrombus.  She had an embolic stroke several days later.  Assessment & Plan    1.  Recent non-ST segment elevation myocardial infarction:  Going for coronary CTA today    2.  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   3.  Mild LV  dysfunction:   BP is 120s . Eventually we would like to start her on ARB or entresto. Will defer to neuro as to how soon we can be more aggressive with her CHF therapy.   4.  Embolic stroke: Plans per the neuro team/stroke team.  5.  Hypertension: Her antihypertensives are currently on hold due to hypotension.  6.  Hyperlipidemia: Her LDL goal is 55.  Currently on crestor 40 mg a day  I would have a low threshold to refer to lipid clinic if she does not tolerate high dose crestor     For questions or updates, please contact [place] Please consult [email or web address] for contact info under        Signed, Mertie Moores, MD  01/23/2022, 8:33 AM

## 2022-01-23 NOTE — Progress Notes (Signed)
ANTICOAGULATION CONSULT NOTE - Follow Up Consult  Pharmacy Consult for heparin Indication:  LV thrombus in setting of CVA  Labs: Recent Labs    01/21/22 1102 01/21/22 1309 01/21/22 1445 01/22/22 0351 01/22/22 0827 01/22/22 1659 01/23/22 0009  HGB  --   --  12.5 12.0  --   --   --   HCT  --   --  37.2 35.8*  --   --   --   PLT  --   --  175 187  --   --   --   LABPROT  --   --  13.8  --   --   --   --   INR  --   --  1.1  --   --   --   --   HEPARINUNFRC  --   --   --  <0.10*  --  0.30 0.29*  CREATININE  --   --  0.82  --  0.87  --   --   TROPONINIHS 341* 377*  --   --  235*  --   --     Assessment: 66yo female subtherapeutic on heparin after one level at very low end of goal; no infusion issues or signs of bleeding per RN.  Goal of Therapy:  Heparin level 0.3-0.5 units/ml   Plan:  Will increase heparin infusion slightly to 900 units/hr and check level in 6 hours.    Vernard Gambles, PharmD, BCPS  01/23/2022,12:56 AM

## 2022-01-23 NOTE — Progress Notes (Signed)
18 g left AC PIV placed at 0550 by IV team using ultrasound guidance.

## 2022-01-23 NOTE — Progress Notes (Addendum)
STROKE TEAM PROGRESS NOTE   INTERVAL HISTORY Her husband is at the bedside.  Coronary CTA planned for today. Afebrile. BP 85-130s. HR 61-70s. On heparin, level subtherapeutic. No SLP/OT/PT follow-up. SLUMS 19/30.   Husband and daughter at bedside. Patient reports she is doing well. No complaints this AM. Denies difficulty speaking, pain. Discussed subtherapeutic heparin level. Discussed that we will hold off on starting eliquis until her procedure is done. Discussed MRI and CTA findings with family. Discussed plan for coronary CTA today. Discussed with cardiology ok to restart 1/2 ARB or entresto.   Repeat brain MRI shows new punctate acute infarcts in the left external capsule, insula, and frontal lobe cortex, and unchanged additional punctate infarct in the left frontal lobe Guadamuz matter.  Vitals:   01/23/22 0400 01/23/22 0500 01/23/22 0600 01/23/22 0700  BP: 125/78 112/76 124/72 130/73  Pulse: 65 66 68 71  Resp: _0 Temp: 98.4 F (36.9 C)     TempSrc: Oral     SpO2: 98% 97% 99% 99%  Weight:      Height:       CBC:  Recent Labs  Lab 01/21/22 1445 01/22/22 0351  WBC 4.9 4.9  NEUTROABS 2.9  --   HGB 12.5 12.0  HCT 37.2 35.8*  MCV 85.5 87.1  PLT 175 841   Basic Metabolic Panel:  Recent Labs  Lab 01/21/22 1445 01/22/22 0827  NA 138 139  K 3.7 4.3  CL 105 107  CO2 22 20*  GLUCOSE 94 113*  BUN 18 15  CREATININE 0.82 0.87  CALCIUM 10.0 8.6*  MG 2.1  --    Lipid Panel:  Recent Labs  Lab 01/22/22 0351  CHOL 223*  TRIG 164*  HDL 40*  CHOLHDL 5.6  VLDL 33  LDLCALC 150*   HgbA1c:  Recent Labs  Lab 01/22/22 0351  HGBA1C 5.0   Urine Drug Screen:  Recent Labs  Lab 01/21/22 1138  LABOPIA NONE DETECTED  COCAINSCRNUR NONE DETECTED  LABBENZ POSITIVE*  AMPHETMU NONE DETECTED  THCU POSITIVE*  LABBARB NONE DETECTED    Alcohol Level  Recent Labs  Lab 01/21/22 1445  ETH <10    IMAGING past 24 hours IR US Guide Vasc Access Right  Result Date:  01/22/2022 INDICATION: 66 year old female with past medical history significant for anxiety, 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 She initially presented on 01/21/2022 with transient episode of aphasia followed sudden onset dizziness, nausea, vomiting, chest pain that radiated to upper back on Saturday. MRI brain showed small ischemic infarct of the frontal Mcduffey matter on the left. Her troponins were elevated to over 300 and her echocardiogram showed apical hypokinesis and a left ventricular thrombus consistent with NSTEMI. Around 6 p.m. on 01/21/2022, patient developed worsening aphasia, NIHSS 6. Head CT was unremarkable. CT angiogram of the head and neck showed a left M2/M3-MCA occlusion. She was then transferred to our service for an emergency mechanical thrombectomy. EXAM: ULTRASOUND-GUIDED VASCULAR ACCESS DIAGNOSTIC CEREBRAL ANGIOGRAM MECHANICAL THROMBECTOMY FLAT PANEL HEAD CT COMPARISON:  CT/CT angiogram of the head and neck January 22, 2019 MEDICATIONS: No antibiotic was administered. ANESTHESIA/SEDATION: The procedure was performed under general anesthesia. CONTRAST:  75 mL of Omnipaque 300 milligram/mL. FLUOROSCOPY: Radiation Exposure Index (as provided by the fluoroscopic device): 324.4 mGy Kerma COMPLICATIONS: None immediate. TECHNIQUE: Informed written consent was obtained from the patient after a thorough discussion of the  procedural risks, benefits and alternatives. All questions were addressed. Maximal Sterile Barrier Technique was utilized including caps, mask, sterile gowns, sterile gloves, sterile drape, hand hygiene and skin antiseptic. A timeout was performed prior to the initiation of the procedure. The right groin was prepped and draped in the usual sterile fashion. Using a micropuncture kit and the modified Seldinger  technique, access was gained to the right common femoral artery and an 8 French sheath was placed. Real-time ultrasound guidance was utilized for vascular access including the acquisition of a permanent ultrasound image documenting patency of the accessed vessel. Under fluoroscopy, a Zoom 88 guide catheter was navigated over a 6 [place] VTK catheter and a 0.035" Terumo Glidewire into the aortic arch. The catheter was placed into the left common carotid artery and then advanced into the left internal carotid artery. The diagnostic catheter was removed. Frontal and lateral angiograms of the head were obtained. FINDINGS: 1. Normal caliber of the right common femoral artery, adequate for vascular access. 2. There is no occlusion of the distal left M2/MCA superior division branch extending to the M3 segment. PROCEDURE: Using biplane roadmap, a Vect 46 aspiration catheter was navigated over an Aristotle 24 microguidewire into the cavernous segment of the left ICA. The aspiration catheter was then advanced over the wire to the level of occlusion in the M2-M3 segment and connected to an aspiration pump. Continuous aspiration was performed for 2 minutes. The guide catheter was connected to a VacLok syringe. The aspiration catheter was subsequently removed under constant aspiration. The guide catheter was aspirated for debris. Left internal carotid artery angiograms with magnified frontal and lateral views of the head showed recanalization of the left MCA with slow flow in a few distal cortical branches (TICI 2C). No embolus to new territory. Flat panel CT of the head was obtained and post processed in a separate workstation with concurrent attending physician supervision. Selected images were sent to PACS. No evidence of hemorrhagic complication. Delayed left internal carotid artery angiogram showed persistent patency of the left MCA. Right common femoral artery angiogram was obtained in right anterior oblique view. The  puncture is at the level of the common femoral artery. The artery has normal caliber, adequate for closure device. The sheath was exchanged over the wire for a Perclose prostyle which was utilized for access closure. Immediate hemostasis was achieved. IMPRESSION: 1. Successful mechanical thrombectomy for treatment of a distal left M2/MCA occlusion with direct contact aspiration achieving complete recanalization (TICI 2C). 2. No thromboembolic or hemorrhagic complication. PLAN: Transfer to ICU for continued post stroke care. Electronically Signed   By: Pedro Earls M.D.   On: 01/22/2022 14:25   MR BRAIN WO CONTRAST  Result Date: 01/22/2022 CLINICAL DATA:  Follow-up stroke EXAM: MRI HEAD WITHOUT CONTRAST TECHNIQUE: Multiplanar, multiecho pulse sequences of the brain and surrounding structures were obtained without intravenous contrast. COMPARISON:  Brain MRI and CT/CTA head and neck 1 day prior FINDINGS: Brain: A punctate fusion of diffusion restriction in the left frontal lobe periventricular Bolick matter is unchanged compared to the study from 1 day prior. Additional small foci of diffusion restriction in the left external capsule, insula, and frontal lobe cortex are new. There is no associated hemorrhage or mass effect. Parenchymal volume is normal. The ventricles are normal in size. Parenchymal signal is otherwise normal, with no significant burden of under vitamin chronic small vessel ischemic change. There is no mass lesion.  There is no mass effect or midline shift. Vascular: Normal flow  voids. Skull and upper cervical spine: Normal marrow signal. Sinuses/Orbits: The paranasal sinuses are clear. The globes and orbits are unremarkable. Other: None. IMPRESSION: New punctate acute infarcts in the left external capsule, insula, and frontal lobe cortex, and unchanged additional punctate infarct in the left frontal lobe Kessinger matter. Electronically Signed   By: Valetta Mole M.D.   On: 01/22/2022  11:39    PHYSICAL EXAM  Physical Exam  Constitutional: Appears well-developed and well-nourished pleasant middle-age Caucasian lady Cardiovascular: Normal rate and regular rhythm.  Respiratory: Effort normal, non-labored breathing  Neuro: Mental Status: Patient is awake, alert, oriented to person, place, month, year, and situation Patient is able to give a clear and coherent history. No signs of aphasia or neglect Cranial Nerves: II: Visual Fields are full. Pupils are equal, round, and reactive to light.   III,IV, VI: EOMI without ptosis or diploplia.  V: Facial sensation is symmetric to temperature VII: Facial movement is symmetric resting and smiling VIII: Hearing is intact to voice X: Palate elevates symmetrically XI: Shoulder shrug is symmetric. XII: Tongue protrudes midline without atrophy or fasciculations.  Motor: Tone is normal. Bulk is normal. 5/5 strength was present in all four extremities.  Sensory: Sensation is symmetric to light touch and temperature in the arms and legs. No extinction to DSS present.  Deep Tendon Reflexes: 2+ and symmetric in the biceps and patellae.  Plantars: Toes are downgoing bilaterally.  Cerebellar: FNF and HKS are intact bilaterally  NIHSS 0   ASSESSMENT/PLAN Ms. Chloe Johnson is a 66 y.o. 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 At approximately 12pm on Saturday she noted a sudden onset of dizziness, nausea, vomiting, and diarrhea with a sudden sharp onset of chest pain with radiation to her back. She also dropped two plates stating that she had no control of her upper extremities. She attributed this to her cervical radiculopathy. This improved throughout the day and then on Sunday she noted some word finding difficulties which fluctuated throughout  the day. In the ED she had some tangential speech and word finding difficulties that worsened at 6pm as well as facial droop on 8/29. NIHSS from 2 to 6. She was taken for a mechanical thrombectomy on 8/29 and had a TICI2c revascularization of Distal M2/proximal M3.    Stroke:   acute punctate infarcts in the left external capsule, insula, and frontal lobe cortex secondary to left M2/M3 occlusion s/p TICI2c revascularization Etiology: MI with LV apical thrombus formation and embolization Code Stroke CT head No acute abnormality. Small vessel disease. Atrophy.  CTA head & neck Occluded mid M2 left MCA vessel  CT perfusion CBF 0, perfusion 58ml, mismatch 74ml Post IR CT No hemorrhage identified MRI - Small acute infarct in the left frontal Rake matter. 8/30 MRI- new punctate acute infarcts in the left external capsule, insula, and frontal lobe cortex, and unchanged additional punctate infarct in the left frontal lobe Berrey matter. 2D Echo 45-50%, LV thrombus  LDL 150 HgbA1c 5.0 VTE prophylaxis - SCDs    Diet   Diet Heart Room service appropriate? Yes with Assist; Fluid consistency: Thin   No antithrombotic prior to admission, now on heparin IV and ASA81. Transition to eliquis prior to discharge. Defer to cardiology re duration of eliquis. Continue ASA indefinitely. Therapy recommendations:  No follow up needed Disposition:  pending CTA  Distal M2/ proximal M3 occlusion  in the setting of MI with LV apical thrombus formation S/p Mechanical thrombectomy with TICI2c revascularization Now on heparin  Anteroapical MI with LV apical thrombus formation Mild LV dysfunction On IV Heparin, plan to transition to eliquis after CTA and prior to discharge. Defer to cardiology re duration of eliquis.  Cardiology on board Plan for Coronary CTA today Troponin peak 377 -> 235 Echo showed apical hypokinesis and a left ventricular thrombus Can restart 1/2 dose of ARB or entresto  Hypertension Home meds:   None BP 140s last night  SBP goal 120-140 Can restart 1/2 dose of ARB or entresto  Hyperlipidemia Home meds:  None LDL 150, goal < 70 Add Crestor 71m Continue statin at discharge  Other Stroke Risk Factors ETOH use, alcohol level <10, advised to drink no more than 1 drink(s) a day Substance abuse - UDS:  THC POSITIVE, Cocaine NONE DETECTED. Patient advised to stop using due to stroke risk.   Hospital day # 2  Patient continues to do well.  She has only occasional word finding difficulties but otherwise is back to her baseline.  Cardiology plan doing coronary CTA today and adding ARB or Entresto..Marland Kitchen Possible discharge later today if okay with cardiology.  Discussed with Dr. NAcie Fredrickson.Long discussion with patient, husband and daughter at the bedside and answered questions.  Greater than 50% time during this 35-minute visit was spent in counseling and coordination of care and discussion with patient and care team and answering questions. PAntony Contras MD  To contact Stroke Continuity provider, please refer to [email or web address] After hours, contact General Neurology

## 2022-01-23 NOTE — Discharge Instructions (Signed)

## 2022-01-26 ENCOUNTER — Encounter (HOSPITAL_COMMUNITY): Payer: Self-pay | Admitting: Emergency Medicine

## 2022-01-26 ENCOUNTER — Other Ambulatory Visit: Payer: Self-pay

## 2022-01-26 ENCOUNTER — Emergency Department (HOSPITAL_COMMUNITY)
Admission: EM | Admit: 2022-01-26 | Discharge: 2022-01-26 | Disposition: A | Discharge status: Home / Self Care | Payer: Federal, State, Local not specified - PPO | Source: Home / Self Care | Attending: Emergency Medicine | Admitting: Emergency Medicine

## 2022-01-26 ENCOUNTER — Telehealth: Payer: Self-pay | Admitting: Home Health

## 2022-01-26 ENCOUNTER — Emergency Department (HOSPITAL_COMMUNITY): Payer: Federal, State, Local not specified - PPO

## 2022-01-26 DIAGNOSIS — R079 Chest pain, unspecified: Secondary | ICD-10-CM | POA: Diagnosis not present

## 2022-01-26 DIAGNOSIS — G8191 Hemiplegia, unspecified affecting right dominant side: Secondary | ICD-10-CM | POA: Diagnosis not present

## 2022-01-26 DIAGNOSIS — I63412 Cerebral infarction due to embolism of left middle cerebral artery: Secondary | ICD-10-CM | POA: Diagnosis not present

## 2022-01-26 DIAGNOSIS — R778 Other specified abnormalities of plasma proteins: Secondary | ICD-10-CM | POA: Insufficient documentation

## 2022-01-26 DIAGNOSIS — I5022 Chronic systolic (congestive) heart failure: Secondary | ICD-10-CM | POA: Diagnosis not present

## 2022-01-26 DIAGNOSIS — N3000 Acute cystitis without hematuria: Secondary | ICD-10-CM | POA: Diagnosis not present

## 2022-01-26 DIAGNOSIS — Z91040 Latex allergy status: Secondary | ICD-10-CM | POA: Insufficient documentation

## 2022-01-26 DIAGNOSIS — Z7901 Long term (current) use of anticoagulants: Secondary | ICD-10-CM | POA: Insufficient documentation

## 2022-01-26 DIAGNOSIS — I10 Essential (primary) hypertension: Secondary | ICD-10-CM | POA: Insufficient documentation

## 2022-01-26 DIAGNOSIS — Z79899 Other long term (current) drug therapy: Secondary | ICD-10-CM | POA: Insufficient documentation

## 2022-01-26 DIAGNOSIS — E785 Hyperlipidemia, unspecified: Secondary | ICD-10-CM | POA: Diagnosis not present

## 2022-01-26 DIAGNOSIS — R29706 NIHSS score 6: Secondary | ICD-10-CM | POA: Diagnosis not present

## 2022-01-26 DIAGNOSIS — R4701 Aphasia: Secondary | ICD-10-CM | POA: Diagnosis not present

## 2022-01-26 DIAGNOSIS — G4700 Insomnia, unspecified: Secondary | ICD-10-CM | POA: Diagnosis not present

## 2022-01-26 DIAGNOSIS — E876 Hypokalemia: Secondary | ICD-10-CM | POA: Insufficient documentation

## 2022-01-26 DIAGNOSIS — I63411 Cerebral infarction due to embolism of right middle cerebral artery: Secondary | ICD-10-CM | POA: Diagnosis not present

## 2022-01-26 DIAGNOSIS — R29818 Other symptoms and signs involving the nervous system: Secondary | ICD-10-CM | POA: Diagnosis not present

## 2022-01-26 DIAGNOSIS — J45909 Unspecified asthma, uncomplicated: Secondary | ICD-10-CM | POA: Diagnosis not present

## 2022-01-26 DIAGNOSIS — S40021A Contusion of right upper arm, initial encounter: Secondary | ICD-10-CM | POA: Insufficient documentation

## 2022-01-26 DIAGNOSIS — F1210 Cannabis abuse, uncomplicated: Secondary | ICD-10-CM | POA: Diagnosis not present

## 2022-01-26 DIAGNOSIS — M25512 Pain in left shoulder: Secondary | ICD-10-CM

## 2022-01-26 DIAGNOSIS — S40022A Contusion of left upper arm, initial encounter: Secondary | ICD-10-CM | POA: Insufficient documentation

## 2022-01-26 DIAGNOSIS — I110 Hypertensive heart disease with heart failure: Secondary | ICD-10-CM | POA: Diagnosis not present

## 2022-01-26 DIAGNOSIS — X58XXXA Exposure to other specified factors, initial encounter: Secondary | ICD-10-CM | POA: Insufficient documentation

## 2022-01-26 DIAGNOSIS — I429 Cardiomyopathy, unspecified: Secondary | ICD-10-CM | POA: Diagnosis not present

## 2022-01-26 DIAGNOSIS — F419 Anxiety disorder, unspecified: Secondary | ICD-10-CM | POA: Diagnosis not present

## 2022-01-26 DIAGNOSIS — I513 Intracardiac thrombosis, not elsewhere classified: Secondary | ICD-10-CM | POA: Diagnosis not present

## 2022-01-26 DIAGNOSIS — I639 Cerebral infarction, unspecified: Secondary | ICD-10-CM | POA: Diagnosis not present

## 2022-01-26 LAB — BASIC METABOLIC PANEL
Anion gap: 11 (ref 5–15)
BUN: 9 mg/dL (ref 8–23)
CO2: 22 mmol/L (ref 22–32)
Calcium: 9.6 mg/dL (ref 8.9–10.3)
Chloride: 109 mmol/L (ref 98–111)
Creatinine, Ser: 0.61 mg/dL (ref 0.44–1.00)
GFR, Estimated: >60 mL/min (ref >60)
Glucose, Bld: 95 mg/dL (ref 70–99)
Potassium: 3.3 mmol/L — ABNORMAL LOW (ref 3.5–5.1)
Sodium: 142 mmol/L (ref 135–145)

## 2022-01-26 LAB — CBC
HCT: 44.1 % (ref 36.0–46.0)
Hemoglobin: 13.8 g/dL (ref 12.0–15.0)
MCH: 29.2 pg (ref 26.0–34.0)
MCHC: 31.3 g/dL (ref 30.0–36.0)
MCV: 93.2 fL (ref 80.0–100.0)
Platelets: 170 10*3/uL (ref 150–400)
RBC: 4.73 MIL/uL (ref 3.87–5.11)
RDW: 11.8 % (ref 11.5–15.5)
WBC: 5.4 10*3/uL (ref 4.0–10.5)
nRBC: 0 % (ref 0.0–0.2)

## 2022-01-26 LAB — TROPONIN I (HIGH SENSITIVITY)
Troponin I (High Sensitivity): 23 ng/L — ABNORMAL HIGH (ref <18)
Troponin I (High Sensitivity): 28 ng/L — ABNORMAL HIGH (ref <18)

## 2022-01-26 MED ORDER — PREGABALIN 25 MG PO CAPS: 25.0000 mg | ORAL_CAPSULE | Freq: Two times a day (BID) | ORAL | 0 refills | Status: DC

## 2022-01-26 NOTE — ED Provider Notes (Signed)
[facility] EMERGENCY DEPARTMENT Provider Note   CSN: [id number] Arrival date & time: 01/26/22  1045     History  Chief Complaint  Patient presents with   Shoulder Pain    Chloe Johnson is a 66 y.o. female.   Shoulder Pain  Patient has a history of hypertension, neck pain, anxiety, reflux, osteomyelitis and recent stroke who presents to the ED with complaints of shoulder pain.  Patient recently admitted to hospital August 29 and discharged on August 31.  She was diagnosed with acute punctate infarcts in the left internal capsule insula and frontal lobe cortex.  Patient had developed an LV apical thrombus associated with a myocardial infarction.  Patient states since leaving the hospital she has been having some pain in her left shoulder area.  She has had some intermittent left arm numbness.  Patient states she has been having some issues with disc issues in her neck and she supposed to have surgery.  However these were the same symptoms that she had when she was diagnosed with her heart attack.  That is part of the reason patient was not aware she was having heart issues because she had attributed to her neck.  Patient states with the symptoms ongoing the last couple days she called the cardiologist and they suggested she come to the ED for further evaluation    Home Medications Prior to Admission medications   Medication Sig Start Date End Date Taking? Authorizing Provider  ALPRAZolam Prudy Feeler) 0.5 MG tablet Take 0.5 mg by mouth at bedtime. 12/23/21   [provider]  apixaban (ELIQUIS) 5 MG TABS tablet Take 1 tablet (5 mg total) by mouth 2 (two) times daily. 01/23/22   Karie Fetch, MD  cyclobenzaprine (FLEXERIL) 10 MG tablet Take 1 tablet (10 mg total) by mouth 3 (three) times daily as needed for muscle spasms. 11/17/14   Tia Alert, MD  guaiFENesin (MUCINEX) 600 MG 12 hr tablet Take 600 mg by mouth as needed for cough or to loosen phlegm.    [provider]  loratadine (CLARITIN) 10 MG tablet Take 10 mg by mouth every morning.    [provider]  MYRBETRIQ 25 MG TB24 tablet Take 25 mg by mouth daily. 12/31/21   [provider]  rosuvastatin (CRESTOR) 20 MG tablet Take 1 tablet (20 mg total) by mouth daily. 01/24/22   Karie Fetch, MD  sacubitril-valsartan (ENTRESTO) 24-26 MG Take 1 tablet by mouth 2 (two) times daily. 01/23/22   Karie Fetch, MD      Allergies    Doxycycline, Erythromycin, Iodine, Latex, Shellfish allergy, Strawberry extract, Vancomycin, Vicodin [hydrocodone-acetaminophen], Dilaudid [hydromorphone hcl], Keflex [cephalexin], Penicillins, and Sulfa antibiotics    Review of Systems   Review of Systems  Physical Exam Updated Vital Signs BP 135/83 (BP Location: Right Arm)   Pulse 75   Temp 98.7 F (37.1 C) (Oral)   Resp 17   Ht 1.549 m (5\' 1" )   Wt 63.5 kg   SpO2 100%   BMI 26.45 kg/m  Physical Exam Vitals and nursing note reviewed.  Constitutional:      General: She is not in acute distress.    Appearance: She is well-developed.  HENT:     Head: Normocephalic and atraumatic.     Right Ear: External ear normal.     Left Ear: External ear normal.  Eyes:     General: No scleral icterus.       Right eye: No discharge.  Left eye: No discharge.     Conjunctiva/sclera: Conjunctivae normal.  Neck:     Trachea: No tracheal deviation.  Cardiovascular:     Rate and Rhythm: Normal rate and regular rhythm.  Pulmonary:     Effort: Pulmonary effort is normal. No respiratory distress.     Breath sounds: Normal breath sounds. No stridor. No wheezing or rales.  Abdominal:     General: Bowel sounds are normal. There is no distension.     Palpations: Abdomen is soft.     Tenderness: There is no abdominal tenderness. There is no guarding or rebound.  Musculoskeletal:        General: No tenderness or deformity.     Cervical back: Neck supple.     Comments: Bruising noted bilateral  arms, patient states this was related to blood draws, normal pulses bilateral radius,   Skin:    General: Skin is warm and dry.     Findings: No rash.  Neurological:     General: No focal deficit present.     Mental Status: She is alert.     Cranial Nerves: No cranial nerve deficit (no facial droop, extraocular movements intact, no slurred speech).     Sensory: No sensory deficit.     Motor: No abnormal muscle tone or seizure activity.     Coordination: Coordination normal.     Comments: Normal grip strength and sensation bilaterally  Psychiatric:        Mood and Affect: Mood normal.     ED Results / Procedures / Treatments   Labs (all labs ordered are listed, but only abnormal results are displayed) Labs Reviewed  TROPONIN I (HIGH SENSITIVITY) - Abnormal; Notable for the following components:      Result Value   Troponin I (High Sensitivity) 23 (*)    All other components within normal limits  CBC  BASIC METABOLIC PANEL  TROPONIN I (HIGH SENSITIVITY)    EKG EKG Interpretation  Date/Time:  Sunday January 26 2022 10:57:21 EDT Ventricular Rate:  79 PR Interval:  136 QRS Duration: 78 QT Interval:  370 QTC Calculation: 424 R Axis:   63 Text Interpretation: Normal sinus rhythm Nonspecific T wave abnormality Abnormal ECG When compared with ECG of 21-Jan-2022 11:05, abnormal t wave changes decreased since last tracing Confirmed by Linwood Dibbles [id number]) on 01/26/2022 11:39:35 AM  Radiology DG Chest 2 View  Result Date: 01/26/2022 CLINICAL DATA:  Chest and shoulder pain.  Recent MRI and CVA. EXAM: CHEST - 2 VIEW COMPARISON:  11/21/2021 FINDINGS: The lungs are clear without focal pneumonia, edema, pneumothorax or pleural effusion. The cardiopericardial silhouette is within normal limits for size. The visualized bony structures of the thorax are unremarkable. IMPRESSION: No active cardiopulmonary disease. Electronically Signed   By: Kennith Center M.D.   On: 01/26/2022 12:03     Procedures Procedures    Medications Ordered in ED Medications - No data to display  ED Course/ Medical Decision Making/ A&P Clinical Course as of 01/26/22 1532  Sun Jan 26, 2022  1228 Chest x-ray images without acute findings [JK]  1531 CBC Normal [JK]    Clinical Course User Index [JK] Linwood Dibbles, MD                           Medical Decision Making Problems Addressed: Left shoulder pain, unspecified chronicity: chronic illness or injury with exacerbation, progression, or side effects of treatment  Amount and/or Complexity of Data Reviewed  Labs: ordered. Decision-making details documented in ED Course. Radiology: ordered and independent interpretation performed.   Patient presented to the ED for evaluation of persistent shoulder pain.  Patient has had some chronic shoulder issues but more recently was admitted to the hospital for stroke NSTEMI and mural thrombus.  Patient had a recent coronary CT.  That showed minimal nonobstructive coronary artery disease.  Patient called her cardiologist office and was instructed return to the ED for laboratory testing.  Patient's initial troponin is slightly elevated at 23 but this is significantly improved from the 230 is 4 days ago we will plan on delta troponin.  Currently pending.  If stable anticipate patient will be appropriate for discharge and outpatient follow-up with her cardiologist.  Care turned over to oncoming team.        Final Clinical Impression(s) / ED Diagnoses Final diagnoses:  Left shoulder pain, unspecified chronicity    Rx / DC Orders ED Discharge Orders     None         Linwood Dibbles, MD 01/26/22 1534

## 2022-01-26 NOTE — ED Provider Notes (Signed)
Patient is a 66 year old female signed out to me pending second troponin and basic metabolic panel.  The patient has had some history of hypokalemia related and a hypertensives, not currently taking the hydrochlorothiazide according to the patient.  She is currently on Eliquis, she is scheduled to see spinal surgery, she is having ongoing discomfort in the shoulder neck and back and requesting Lyrica which I think is reasonable given that she does not want to be on an opiate also a reasonable thing to avoid.  Patient is unremarkable in appearance, cardiovascular exam is unremarkable, pulses are normal, no edema, vital signs are reassuring.  Patient will be discharged with prescription for Lyrica and close PCP follow-up  The patient is agreeable to the plan and aware of all of her results.  Final diagnoses:  Left shoulder pain, unspecified chronicity      Eber Hong, MD 01/26/22 1740

## 2022-01-26 NOTE — Discharge Instructions (Addendum)
We have started Lyrica twice a day, this starts as a low dose and can be increased over time.  This will make sure that you tolerate the medication and it is less likely to have bad side effects.  That being said if you are developing increasing or worsening chest pain shortness of breath numbness weakness or any other worsening symptoms please stop the medicine and call your doctor or return to the emergency department immediately.  You should follow-up with your doctor in several days for recheck, they will need to recheck your potassium level within a couple of weeks, make sure you are eating potassium rich foods such as bananas and black beans.

## 2022-01-26 NOTE — ED Triage Notes (Signed)
Patient was discharged from hospital Thursday afternoon. Today having left shoulder and shoulder blade pain intermittently. States she has the pain when she was here but it has persisted. Minor relief with tylenol. Patient was started on eliquis. All daily meds were taken PTA.

## 2022-01-26 NOTE — Telephone Encounter (Signed)
Patient called after hour line today reporting she had MI and CVA recently, discharged on 01/23/22. She continue to have significant back and shoulder pain where it was similar to her MI presentation. She noted herself having some PVCs. She is taking all her medication. She is not sure if her pain is related to her heart or MUSK. It's difficult to make a diagnosis over the phone as she has significant persistent discomfort, advised the patient to go to ER for evaluation, will need exam, cardiac enzymes, imaging study, etc to rule out major acute issues. She has agreed.

## 2022-01-28 ENCOUNTER — Inpatient Hospital Stay (HOSPITAL_COMMUNITY): Payer: Federal, State, Local not specified - PPO

## 2022-01-28 ENCOUNTER — Emergency Department (HOSPITAL_COMMUNITY): Payer: Federal, State, Local not specified - PPO

## 2022-01-28 ENCOUNTER — Inpatient Hospital Stay: Payer: Self-pay

## 2022-01-28 ENCOUNTER — Inpatient Hospital Stay (HOSPITAL_COMMUNITY): Payer: Federal, State, Local not specified - PPO | Admitting: Certified Registered Nurse Anesthetist

## 2022-01-28 ENCOUNTER — Encounter (HOSPITAL_COMMUNITY)
Admission: EM | Disposition: A | Discharge status: Rehabilitation Facility | Payer: Self-pay | Source: Home / Self Care | Attending: Neurology

## 2022-01-28 ENCOUNTER — Inpatient Hospital Stay (HOSPITAL_COMMUNITY)
Admission: EM | Admit: 2022-01-28 | Discharge: 2022-02-04 | DRG: 024 | Disposition: A | Discharge status: Rehabilitation Facility | Payer: Federal, State, Local not specified - PPO | Attending: Internal Medicine | Admitting: Internal Medicine

## 2022-01-28 ENCOUNTER — Other Ambulatory Visit: Payer: Self-pay

## 2022-01-28 DIAGNOSIS — Z882 Allergy status to sulfonamides status: Secondary | ICD-10-CM

## 2022-01-28 DIAGNOSIS — N390 Urinary tract infection, site not specified: Secondary | ICD-10-CM | POA: Diagnosis not present

## 2022-01-28 DIAGNOSIS — I69351 Hemiplegia and hemiparesis following cerebral infarction affecting right dominant side: Secondary | ICD-10-CM | POA: Diagnosis not present

## 2022-01-28 DIAGNOSIS — M48061 Spinal stenosis, lumbar region without neurogenic claudication: Secondary | ICD-10-CM | POA: Diagnosis present

## 2022-01-28 DIAGNOSIS — G4700 Insomnia, unspecified: Secondary | ICD-10-CM | POA: Diagnosis present

## 2022-01-28 DIAGNOSIS — R29706 NIHSS score 6: Secondary | ICD-10-CM | POA: Diagnosis present

## 2022-01-28 DIAGNOSIS — I639 Cerebral infarction, unspecified: Secondary | ICD-10-CM | POA: Diagnosis present

## 2022-01-28 DIAGNOSIS — F419 Anxiety disorder, unspecified: Secondary | ICD-10-CM | POA: Diagnosis present

## 2022-01-28 DIAGNOSIS — R0689 Other abnormalities of breathing: Secondary | ICD-10-CM | POA: Diagnosis not present

## 2022-01-28 DIAGNOSIS — J9600 Acute respiratory failure, unspecified whether with hypoxia or hypercapnia: Secondary | ICD-10-CM | POA: Diagnosis not present

## 2022-01-28 DIAGNOSIS — Z881 Allergy status to other antibiotic agents status: Secondary | ICD-10-CM

## 2022-01-28 DIAGNOSIS — I5181 Takotsubo syndrome: Secondary | ICD-10-CM | POA: Diagnosis not present

## 2022-01-28 DIAGNOSIS — Z9109 Other allergy status, other than to drugs and biological substances: Secondary | ICD-10-CM

## 2022-01-28 DIAGNOSIS — I69392 Facial weakness following cerebral infarction: Secondary | ICD-10-CM | POA: Diagnosis not present

## 2022-01-28 DIAGNOSIS — I2510 Atherosclerotic heart disease of native coronary artery without angina pectoris: Secondary | ICD-10-CM | POA: Diagnosis present

## 2022-01-28 DIAGNOSIS — R29810 Facial weakness: Secondary | ICD-10-CM | POA: Diagnosis present

## 2022-01-28 DIAGNOSIS — J45909 Unspecified asthma, uncomplicated: Secondary | ICD-10-CM | POA: Diagnosis present

## 2022-01-28 DIAGNOSIS — N3000 Acute cystitis without hematuria: Secondary | ICD-10-CM | POA: Diagnosis not present

## 2022-01-28 DIAGNOSIS — E785 Hyperlipidemia, unspecified: Secondary | ICD-10-CM | POA: Diagnosis present

## 2022-01-28 DIAGNOSIS — M7981 Nontraumatic hematoma of soft tissue: Secondary | ICD-10-CM | POA: Diagnosis not present

## 2022-01-28 DIAGNOSIS — E7800 Pure hypercholesterolemia, unspecified: Secondary | ICD-10-CM | POA: Diagnosis not present

## 2022-01-28 DIAGNOSIS — I63512 Cerebral infarction due to unspecified occlusion or stenosis of left middle cerebral artery: Secondary | ICD-10-CM | POA: Diagnosis not present

## 2022-01-28 DIAGNOSIS — R000 Tachycardia, unspecified: Secondary | ICD-10-CM | POA: Diagnosis not present

## 2022-01-28 DIAGNOSIS — Z4682 Encounter for fitting and adjustment of non-vascular catheter: Secondary | ICD-10-CM | POA: Diagnosis not present

## 2022-01-28 DIAGNOSIS — I429 Cardiomyopathy, unspecified: Secondary | ICD-10-CM | POA: Diagnosis present

## 2022-01-28 DIAGNOSIS — Z880 Allergy status to penicillin: Secondary | ICD-10-CM

## 2022-01-28 DIAGNOSIS — I5030 Unspecified diastolic (congestive) heart failure: Secondary | ICD-10-CM | POA: Diagnosis not present

## 2022-01-28 DIAGNOSIS — G8929 Other chronic pain: Secondary | ICD-10-CM | POA: Diagnosis not present

## 2022-01-28 DIAGNOSIS — I6602 Occlusion and stenosis of left middle cerebral artery: Secondary | ICD-10-CM | POA: Diagnosis present

## 2022-01-28 DIAGNOSIS — Z888 Allergy status to other drugs, medicaments and biological substances status: Secondary | ICD-10-CM

## 2022-01-28 DIAGNOSIS — Z91013 Allergy to seafood: Secondary | ICD-10-CM

## 2022-01-28 DIAGNOSIS — R404 Transient alteration of awareness: Secondary | ICD-10-CM | POA: Diagnosis not present

## 2022-01-28 DIAGNOSIS — Z7901 Long term (current) use of anticoagulants: Secondary | ICD-10-CM

## 2022-01-28 DIAGNOSIS — I63412 Cerebral infarction due to embolism of left middle cerebral artery: Principal | ICD-10-CM | POA: Diagnosis present

## 2022-01-28 DIAGNOSIS — I63311 Cerebral infarction due to thrombosis of right middle cerebral artery: Secondary | ICD-10-CM | POA: Diagnosis not present

## 2022-01-28 DIAGNOSIS — I5022 Chronic systolic (congestive) heart failure: Secondary | ICD-10-CM | POA: Diagnosis not present

## 2022-01-28 DIAGNOSIS — I252 Old myocardial infarction: Secondary | ICD-10-CM | POA: Diagnosis not present

## 2022-01-28 DIAGNOSIS — S301XXA Contusion of abdominal wall, initial encounter: Secondary | ICD-10-CM | POA: Diagnosis not present

## 2022-01-28 DIAGNOSIS — Z8249 Family history of ischemic heart disease and other diseases of the circulatory system: Secondary | ICD-10-CM

## 2022-01-28 DIAGNOSIS — F1210 Cannabis abuse, uncomplicated: Secondary | ICD-10-CM | POA: Diagnosis present

## 2022-01-28 DIAGNOSIS — I513 Intracardiac thrombosis, not elsewhere classified: Secondary | ICD-10-CM | POA: Diagnosis present

## 2022-01-28 DIAGNOSIS — G8191 Hemiplegia, unspecified affecting right dominant side: Secondary | ICD-10-CM | POA: Diagnosis present

## 2022-01-28 DIAGNOSIS — R29818 Other symptoms and signs involving the nervous system: Secondary | ICD-10-CM | POA: Diagnosis not present

## 2022-01-28 DIAGNOSIS — I110 Hypertensive heart disease with heart failure: Secondary | ICD-10-CM | POA: Diagnosis not present

## 2022-01-28 DIAGNOSIS — Z79899 Other long term (current) drug therapy: Secondary | ICD-10-CM

## 2022-01-28 DIAGNOSIS — T148XXA Other injury of unspecified body region, initial encounter: Secondary | ICD-10-CM | POA: Diagnosis not present

## 2022-01-28 DIAGNOSIS — I63411 Cerebral infarction due to embolism of right middle cerebral artery: Secondary | ICD-10-CM | POA: Diagnosis present

## 2022-01-28 DIAGNOSIS — Z91040 Latex allergy status: Secondary | ICD-10-CM | POA: Diagnosis not present

## 2022-01-28 DIAGNOSIS — I69320 Aphasia following cerebral infarction: Secondary | ICD-10-CM | POA: Diagnosis not present

## 2022-01-28 DIAGNOSIS — R1312 Dysphagia, oropharyngeal phase: Secondary | ICD-10-CM | POA: Diagnosis not present

## 2022-01-28 DIAGNOSIS — I10 Essential (primary) hypertension: Secondary | ICD-10-CM | POA: Diagnosis not present

## 2022-01-28 DIAGNOSIS — K219 Gastro-esophageal reflux disease without esophagitis: Secondary | ICD-10-CM | POA: Diagnosis present

## 2022-01-28 DIAGNOSIS — Z8673 Personal history of transient ischemic attack (TIA), and cerebral infarction without residual deficits: Secondary | ICD-10-CM

## 2022-01-28 DIAGNOSIS — R4701 Aphasia: Secondary | ICD-10-CM | POA: Diagnosis present

## 2022-01-28 DIAGNOSIS — Z885 Allergy status to narcotic agent status: Secondary | ICD-10-CM | POA: Diagnosis not present

## 2022-01-28 DIAGNOSIS — M25512 Pain in left shoulder: Secondary | ICD-10-CM | POA: Diagnosis present

## 2022-01-28 DIAGNOSIS — I509 Heart failure, unspecified: Secondary | ICD-10-CM | POA: Diagnosis not present

## 2022-01-28 DIAGNOSIS — K5900 Constipation, unspecified: Secondary | ICD-10-CM | POA: Diagnosis not present

## 2022-01-28 DIAGNOSIS — R531 Weakness: Secondary | ICD-10-CM | POA: Diagnosis not present

## 2022-01-28 DIAGNOSIS — Z91041 Radiographic dye allergy status: Secondary | ICD-10-CM | POA: Diagnosis not present

## 2022-01-28 DIAGNOSIS — D649 Anemia, unspecified: Secondary | ICD-10-CM | POA: Diagnosis not present

## 2022-01-28 DIAGNOSIS — M542 Cervicalgia: Secondary | ICD-10-CM | POA: Diagnosis present

## 2022-01-28 HISTORY — PX: IR PERCUTANEOUS ART THROMBECTOMY/INFUSION INTRACRANIAL INC DIAG ANGIO: IMG6087

## 2022-01-28 HISTORY — PX: RADIOLOGY WITH ANESTHESIA: SHX6223

## 2022-01-28 HISTORY — PX: IR CT HEAD LTD: IMG2386

## 2022-01-28 LAB — CBC
HCT: 35.5 % — ABNORMAL LOW (ref 36.0–46.0)
Hemoglobin: 12 g/dL (ref 12.0–15.0)
MCH: 29.9 pg (ref 26.0–34.0)
MCHC: 33.8 g/dL (ref 30.0–36.0)
MCV: 88.5 fL (ref 80.0–100.0)
Platelets: 171 10*3/uL (ref 150–400)
RBC: 4.01 MIL/uL (ref 3.87–5.11)
RDW: 11.9 % (ref 11.5–15.5)
WBC: 4.7 10*3/uL (ref 4.0–10.5)
nRBC: 0 % (ref 0.0–0.2)

## 2022-01-28 LAB — DIFFERENTIAL
Abs Immature Granulocytes: 0.01 10*3/uL (ref 0.00–0.07)
Basophils Absolute: 0.1 10*3/uL (ref 0.0–0.1)
Basophils Relative: 1 %
Eosinophils Absolute: 0.1 10*3/uL (ref 0.0–0.5)
Eosinophils Relative: 2 %
Immature Granulocytes: 0 %
Lymphocytes Relative: 35 %
Lymphs Abs: 1.6 10*3/uL (ref 0.7–4.0)
Monocytes Absolute: 0.4 10*3/uL (ref 0.1–1.0)
Monocytes Relative: 9 %
Neutro Abs: 2.5 10*3/uL (ref 1.7–7.7)
Neutrophils Relative %: 53 %

## 2022-01-28 LAB — COMPREHENSIVE METABOLIC PANEL
ALT: 18 U/L (ref 0–44)
AST: 22 U/L (ref 15–41)
Albumin: 3.9 g/dL (ref 3.5–5.0)
Alkaline Phosphatase: 45 U/L (ref 38–126)
Anion gap: 9 (ref 5–15)
BUN: 8 mg/dL (ref 8–23)
CO2: 23 mmol/L (ref 22–32)
Calcium: 9.4 mg/dL (ref 8.9–10.3)
Chloride: 109 mmol/L (ref 98–111)
Creatinine, Ser: 0.84 mg/dL (ref 0.44–1.00)
GFR, Estimated: >60 mL/min (ref >60)
Glucose, Bld: 115 mg/dL — ABNORMAL HIGH (ref 70–99)
Potassium: 3.8 mmol/L (ref 3.5–5.1)
Sodium: 141 mmol/L (ref 135–145)
Total Bilirubin: 0.3 mg/dL (ref 0.3–1.2)
Total Protein: 6.4 g/dL — ABNORMAL LOW (ref 6.5–8.1)

## 2022-01-28 LAB — RAPID URINE DRUG SCREEN, HOSP PERFORMED
Amphetamines: NOT DETECTED
Barbiturates: NOT DETECTED
Benzodiazepines: NOT DETECTED
Cocaine: NOT DETECTED
Opiates: NOT DETECTED
Tetrahydrocannabinol: POSITIVE — AB

## 2022-01-28 LAB — POCT I-STAT, CHEM 8
BUN: 10 mg/dL (ref 8–23)
Calcium, Ion: 0.94 mmol/L — ABNORMAL LOW (ref 1.15–1.40)
Chloride: 109 mmol/L (ref 98–111)
Creatinine, Ser: 0.7 mg/dL (ref 0.44–1.00)
Glucose, Bld: 110 mg/dL — ABNORMAL HIGH (ref 70–99)
HCT: 35 % — ABNORMAL LOW (ref 36.0–46.0)
Hemoglobin: 11.9 g/dL — ABNORMAL LOW (ref 12.0–15.0)
Potassium: 4 mmol/L (ref 3.5–5.1)
Sodium: 138 mmol/L (ref 135–145)
TCO2: 22 mmol/L (ref 22–32)

## 2022-01-28 LAB — PROTIME-INR
INR: 1.2 (ref 0.8–1.2)
Prothrombin Time: 15 seconds (ref 11.4–15.2)

## 2022-01-28 LAB — POCT I-STAT 7, (LYTES, BLD GAS, ICA,H+H)
Acid-base deficit: 2 mmol/L (ref 0.0–2.0)
Bicarbonate: 23.7 mmol/L (ref 20.0–28.0)
Calcium, Ion: 1.23 mmol/L (ref 1.15–1.40)
HCT: 30 % — ABNORMAL LOW (ref 36.0–46.0)
Hemoglobin: 10.2 g/dL — ABNORMAL LOW (ref 12.0–15.0)
O2 Saturation: 100 %
Patient temperature: 97.5
Potassium: 3.7 mmol/L (ref 3.5–5.1)
Sodium: 142 mmol/L (ref 135–145)
TCO2: 25 mmol/L (ref 22–32)
pCO2 arterial: 44.7 mmHg (ref 32–48)
pH, Arterial: 7.33 — ABNORMAL LOW (ref 7.35–7.45)
pO2, Arterial: 495 mmHg — ABNORMAL HIGH (ref 83–108)

## 2022-01-28 LAB — URINALYSIS, ROUTINE W REFLEX MICROSCOPIC
Bilirubin Urine: NEGATIVE
Glucose, UA: NEGATIVE mg/dL
Hgb urine dipstick: NEGATIVE
Ketones, ur: NEGATIVE mg/dL
Leukocytes,Ua: NEGATIVE
Nitrite: NEGATIVE
Protein, ur: 30 mg/dL — AB
Specific Gravity, Urine: >1.046 — ABNORMAL HIGH (ref 1.005–1.030)
pH: 5 (ref 5.0–8.0)

## 2022-01-28 LAB — ETHANOL: Alcohol, Ethyl (B): <10 mg/dL (ref <10)

## 2022-01-28 LAB — APTT: aPTT: 24 seconds (ref 24–36)

## 2022-01-28 LAB — GLUCOSE, CAPILLARY: Glucose-Capillary: 103 mg/dL — ABNORMAL HIGH (ref 70–99)

## 2022-01-28 LAB — TROPONIN I (HIGH SENSITIVITY): Troponin I (High Sensitivity): 69 ng/L — ABNORMAL HIGH (ref <18)

## 2022-01-28 LAB — CBG MONITORING, ED: Glucose-Capillary: 125 mg/dL — ABNORMAL HIGH (ref 70–99)

## 2022-01-28 SURGERY — IR WITH ANESTHESIA
Anesthesia: General

## 2022-01-28 MED ORDER — ACETAMINOPHEN 650 MG RE SUPP: 650.0000 mg | RECTAL | Status: DC | PRN

## 2022-01-28 MED ORDER — FENTANYL CITRATE PF 50 MCG/ML IJ SOSY
50.0000 ug | PREFILLED_SYRINGE | INTRAMUSCULAR | Status: DC | PRN
  Administered 2022-01-28 (×3): 100 ug via INTRAVENOUS
  Filled 2022-01-28 (×3): qty 2

## 2022-01-28 MED ORDER — LIDOCAINE HCL 1 % IJ SOLN
INTRAMUSCULAR | Status: AC
  Filled 2022-01-28: qty 20

## 2022-01-28 MED ORDER — EPHEDRINE SULFATE-NACL 50-0.9 MG/10ML-% IV SOSY
PREFILLED_SYRINGE | INTRAVENOUS | Status: DC | PRN
  Administered 2022-01-28: 7.5 mg via INTRAVENOUS
  Administered 2022-01-28: 10 mg via INTRAVENOUS

## 2022-01-28 MED ORDER — ROCURONIUM BROMIDE 10 MG/ML (PF) SYRINGE
PREFILLED_SYRINGE | INTRAVENOUS | Status: DC | PRN
  Administered 2022-01-28 (×2): 50 mg via INTRAVENOUS

## 2022-01-28 MED ORDER — CEFAZOLIN SODIUM-DEXTROSE 2-4 GM/100ML-% IV SOLN
INTRAVENOUS | Status: AC
  Filled 2022-01-28: qty 100

## 2022-01-28 MED ORDER — SODIUM CHLORIDE 0.9% FLUSH
3.0000 mL | Freq: Once | INTRAVENOUS | Status: AC
  Administered 2022-01-28: 3 mL via INTRAVENOUS

## 2022-01-28 MED ORDER — HEPARIN (PORCINE) 25000 UT/250ML-% IV SOLN
900.0000 [IU]/h | INTRAVENOUS | Status: AC
  Administered 2022-01-28: 500 [IU]/h via INTRAVENOUS
  Administered 2022-01-30: 900 [IU]/h via INTRAVENOUS
  Filled 2022-01-28 (×2): qty 250

## 2022-01-28 MED ORDER — PANTOPRAZOLE 2 MG/ML SUSPENSION
40.0000 mg | Freq: Every day | ORAL | Status: DC
  Filled 2022-01-28: qty 20

## 2022-01-28 MED ORDER — CLEVIDIPINE BUTYRATE 0.5 MG/ML IV EMUL: 0.0000 mg/h | INTRAVENOUS | Status: DC

## 2022-01-28 MED ORDER — NITROGLYCERIN 1 MG/10 ML FOR IR/CATH LAB
INTRA_ARTERIAL | Status: AC | PRN
  Administered 2022-01-28 (×5): 25 ug via INTRA_ARTERIAL

## 2022-01-28 MED ORDER — PANTOPRAZOLE SODIUM 40 MG IV SOLR: 40.0000 mg | Freq: Every day | INTRAVENOUS | Status: DC

## 2022-01-28 MED ORDER — PROPOFOL 10 MG/ML IV BOLUS
INTRAVENOUS | Status: DC | PRN
  Administered 2022-01-28: 40 mg via INTRAVENOUS
  Administered 2022-01-28: 130 mg via INTRAVENOUS

## 2022-01-28 MED ORDER — PHENYLEPHRINE HCL-NACL 20-0.9 MG/250ML-% IV SOLN
INTRAVENOUS | Status: DC | PRN
  Administered 2022-01-28: 150 ug/min via INTRAVENOUS

## 2022-01-28 MED ORDER — NITROGLYCERIN 1 MG/10 ML FOR IR/CATH LAB
INTRA_ARTERIAL | Status: AC
  Filled 2022-01-28: qty 10

## 2022-01-28 MED ORDER — SODIUM CHLORIDE 0.9 % IV SOLN: INTRAVENOUS | Status: DC

## 2022-01-28 MED ORDER — ACETAMINOPHEN 160 MG/5ML PO SOLN: 650.0000 mg | ORAL | Status: DC | PRN

## 2022-01-28 MED ORDER — SODIUM CHLORIDE 0.9 % IV SOLN: INTRAVENOUS | Status: DC | PRN

## 2022-01-28 MED ORDER — SODIUM CHLORIDE 0.9 % IV SOLN: 250.0000 mL | INTRAVENOUS | Status: DC

## 2022-01-28 MED ORDER — FENTANYL CITRATE PF 50 MCG/ML IJ SOSY
50.0000 ug | PREFILLED_SYRINGE | INTRAMUSCULAR | Status: DC | PRN
  Administered 2022-01-29: 50 ug via INTRAVENOUS
  Filled 2022-01-28: qty 1

## 2022-01-28 MED ORDER — ROSUVASTATIN CALCIUM 20 MG PO TABS
20.0000 mg | ORAL_TABLET | Freq: Every day | ORAL | Status: DC
  Filled 2022-01-28: qty 1

## 2022-01-28 MED ORDER — IOHEXOL 300 MG/ML  SOLN
100.0000 mL | Freq: Once | INTRAMUSCULAR | Status: AC | PRN
  Administered 2022-01-28: 80 mL via INTRA_ARTERIAL

## 2022-01-28 MED ORDER — HYDRALAZINE HCL 20 MG/ML IJ SOLN: 10.0000 mg | INTRAMUSCULAR | Status: DC | PRN

## 2022-01-28 MED ORDER — PROPOFOL 1000 MG/100ML IV EMUL
0.0000 ug/kg/min | INTRAVENOUS | Status: DC
  Administered 2022-01-28 – 2022-01-29 (×5): 50 ug/kg/min via INTRAVENOUS
  Filled 2022-01-28: qty 100
  Filled 2022-01-28 (×2): qty 200

## 2022-01-28 MED ORDER — SENNOSIDES-DOCUSATE SODIUM 8.6-50 MG PO TABS: 1.0000 | ORAL_TABLET | Freq: Every evening | ORAL | Status: DC | PRN

## 2022-01-28 MED ORDER — CLEVIDIPINE BUTYRATE 0.5 MG/ML IV EMUL
INTRAVENOUS | Status: DC | PRN
  Administered 2022-01-28: 5 mg/h via INTRAVENOUS

## 2022-01-28 MED ORDER — SODIUM CHLORIDE 0.9% FLUSH: 10.0000 mL | INTRAVENOUS | Status: DC | PRN

## 2022-01-28 MED ORDER — SODIUM CHLORIDE 0.9% FLUSH
10.0000 mL | Freq: Two times a day (BID) | INTRAVENOUS | Status: DC
  Administered 2022-01-28: 40 mL
  Administered 2022-01-29 (×2): 10 mL
  Administered 2022-01-30 (×2): 20 mL
  Administered 2022-01-31 (×2): 10 mL
  Administered 2022-02-01: 30 mL
  Administered 2022-02-01: 10 mL
  Administered 2022-02-02: 40 mL
  Administered 2022-02-02: 10 mL
  Administered 2022-02-03: 40 mL
  Administered 2022-02-04: 10 mL
  Administered 2022-02-04: 40 mL

## 2022-01-28 MED ORDER — CLEVIDIPINE BUTYRATE 0.5 MG/ML IV EMUL
0.0000 mg/h | INTRAVENOUS | Status: DC
  Administered 2022-01-28: 1 mg/h via INTRAVENOUS
  Administered 2022-01-29 (×2): 17 mg/h via INTRAVENOUS
  Administered 2022-01-29: 16 mg/h via INTRAVENOUS
  Filled 2022-01-28 (×4): qty 100

## 2022-01-28 MED ORDER — STROKE: EARLY STAGES OF RECOVERY BOOK
Freq: Once | Status: AC
  Filled 2022-01-28: qty 1

## 2022-01-28 MED ORDER — ORAL CARE MOUTH RINSE
15.0000 mL | OROMUCOSAL | Status: DC
  Administered 2022-01-28 – 2022-01-30 (×15): 15 mL via OROMUCOSAL

## 2022-01-28 MED ORDER — DOCUSATE SODIUM 50 MG/5ML PO LIQD
100.0000 mg | Freq: Two times a day (BID) | ORAL | Status: DC
  Administered 2022-01-28: 100 mg
  Filled 2022-01-28: qty 10

## 2022-01-28 MED ORDER — FENTANYL CITRATE (PF) 100 MCG/2ML IJ SOLN
INTRAMUSCULAR | Status: AC
  Filled 2022-01-28: qty 2

## 2022-01-28 MED ORDER — POLYETHYLENE GLYCOL 3350 17 G PO PACK
17.0000 g | PACK | Freq: Every day | ORAL | Status: DC
  Filled 2022-01-28: qty 1

## 2022-01-28 MED ORDER — PHENYLEPHRINE 80 MCG/ML (10ML) SYRINGE FOR IV PUSH (FOR BLOOD PRESSURE SUPPORT)
PREFILLED_SYRINGE | INTRAVENOUS | Status: DC | PRN
  Administered 2022-01-28: 240 ug via INTRAVENOUS

## 2022-01-28 MED ORDER — CHLORHEXIDINE GLUCONATE CLOTH 2 % EX PADS
6.0000 | MEDICATED_PAD | Freq: Every day | CUTANEOUS | Status: DC
  Administered 2022-01-28 – 2022-02-04 (×6): 6 via TOPICAL

## 2022-01-28 MED ORDER — CLEVIDIPINE BUTYRATE 0.5 MG/ML IV EMUL
INTRAVENOUS | Status: AC
  Filled 2022-01-28: qty 50

## 2022-01-28 MED ORDER — ACETAMINOPHEN 325 MG PO TABS: 650.0000 mg | ORAL_TABLET | ORAL | Status: DC | PRN

## 2022-01-28 MED ORDER — SUGAMMADEX SODIUM 200 MG/2ML IV SOLN
INTRAVENOUS | Status: DC | PRN
  Administered 2022-01-28: 200 mg via INTRAVENOUS

## 2022-01-28 MED ORDER — NOREPINEPHRINE 4 MG/250ML-% IV SOLN: 2.0000 ug/min | INTRAVENOUS | Status: DC

## 2022-01-28 MED ORDER — ORAL CARE MOUTH RINSE: 15.0000 mL | OROMUCOSAL | Status: DC | PRN

## 2022-01-28 NOTE — Consult Note (Addendum)
NAME:  Chloe Johnson, MRN:  [id number], DOB:  Apr 21, 1956, LOS: 0 ADMISSION DATE:  01/28/2022, CONSULTATION DATE:  9/5 REFERRING MD:  Dr. Iver Nestle, CHIEF COMPLAINT:  L MCA stroke s/p thrombectomy   History of Present Illness:  Patient is a 66 year old female with pertinent PMH of recent L MCA stroke and LV thrombus, HTN, HLD, hepatitis presents to [facility] ED on 9/5 with bilateral weakness.  Patient recently admitted to [facility] on 8/29 with L MCA m2-m3 occlusion stroke.  Patient received mechanical thrombectomy.  Echo showing LVEF 45 to 50% and LV thrombus measuring 1.84 x 1.2 cm.  Patient also with elevated troponins and was started on heparin.  Patient discharged on 8/31.  On 9/2 patient began experiencing symptoms of dizziness that was followed by nausea/vomiting.  Her symptoms seem to have improved but then on 9/5 patient stated she felt generally weak.  Around 9 AM she was having trouble finding words.  Husband states she had right facial droop.  Husband also states that she dropped something and tried to catch it and lost her balance in both her legs and when getting up is weaker on the right side.  Patient states she has not missed any doses of her Eliquis.  Patient came to [facility] ED for further work-up.  On arrival to [facility] ED on 9/5 as a code stroke.  CT head without acute abnormality.  Was unable to get CTA of head due to IV infiltration.  Neuro following. Neuro exam does show bilateral weakness and R facial droop.  Patient taken directly to IR for thrombectomy. Patient will go to ICU post thrombectomy. PCCM consulted to follow for medical management.  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/16/2013   L3-4/notes 11/24/2013, arms, neck   Exertional asthma    GERD (gastroesophageal reflux disease)    Hypertension    Kidney stones    "have always passed them"   Neck pain    Osteomyelitis (HCC) 11/23/2013   osteomyelitis,  discitis    Sleep concern    uses Prozac for sleep      Significant Hospital Events: Including procedures, antibiotic start and stop dates in addition to other pertinent events   8/29 L MCA stroke s/p thrombectomy; LV thrombus started on eliquis 8/31 discharged 9/5 L MCA stroke s/p thrombectomy; post op intubated  Interim History / Subjective:  Patient sedated on propofol Off cleviprex; SBP 130s Intubated on mech vent  Objective   Weight 59 kg.       No intake or output data in the 24 hours ending 01/28/22 1443 Filed Weights   01/28/22 1332  Weight: 59 kg    Examination: General:  critically ill appearing on mech vent HEENT: MM pink/moist; ETT in place Neuro: sedated; PERRL; cough/gag present CV: s1s2, no m/r/g PULM:  dim clear BS bilaterally; on mech vent PRVC GI: soft, bsx4 active  Extremities: warm/dry, no ble edema  Skin: no rashes or lesions appreciated   Resolved Hospital Problem list     Assessment & Plan:  Stroke s/p thrombectomy: unable to obtain CTA head due to IV extravasation; IR angio showing occluded L MCA M2 region; TICI 2c revascularization  Recent L MCA stroke: m2-m3 occlusion s/p thrombectomy on 8/29 w/ TICI2 revascularization P: -neuro following; appreciate recs -further imaging per neuro; MRI when able -troponin pending -check UDS -frequent neuro checks -limit sedating meds -  cleviprex/levo for sbp goal 120-140 -resume home statin -DAPT per neuro -consider starting heparin per neuro if no bleed on repeat imaging post thrombectomy -Continue neuroprotective measures- normothermia, euglycemia, HOB greater than 30, head in neutral alignment, normocapnia, normoxia.  -PT/OT/SLP  Post op mechanical ventilation P: -LTVV strategy with tidal volumes of 6-8 cc/kg ideal body weight -check ABG and adjust settings accordingly  -CXR to confirm ETT position -Wean PEEP/FiO2 for SpO2 >92% -VAP bundle in place -Daily SAT and SBT -PAD protocol in  place -wean sedation for RASS goal 0 to -1  Recent LV thrombus w/ NSTEMI on Eliquis; 8/31 coronary CT with minimal obstructive CAD HFrEF: echo 8/29 EF 45-50% HTN HLD P: -AC on hold -consider starting heparin gtt post procedure pending repeat images per neuro -SCDs dvt ppx -Continue statin -Hold home antihypertensives while on Cleviprex/Neo; sbp goal as above -Was seen by cards last admission; consider cards consult -troponin pending  Anxiety Insomnia P: -Hold home meds given recent neuro status change  Lumbar stenosis P: -Further follow-up outpatient -hold home meds due to sedating effect  Hx of marijuana use P: -check uds -counseling when appropriate  Best Practice (right click and "Reselect all SmartList Selections" daily)   Diet/type: NPO w/ meds via tube DVT prophylaxis: SCD; consider heparin gtt per neuro GI prophylaxis: PPI Lines: N/A Foley:  N/A Code Status:  full code Last date of multidisciplinary goals of care discussion [per primary]  Labs   CBC: Recent Labs  Lab 01/21/22 1445 01/22/22 0351 01/26/22 1118 01/28/22 1330 01/28/22 1337  WBC 4.9 4.9 5.4 4.7  --   NEUTROABS 2.9  --   --  2.5  --   HGB 12.5 12.0 13.8 12.0 11.9*  HCT 37.2 35.8* 44.1 35.5* 35.0*  MCV 85.5 87.1 93.2 88.5  --   PLT 175 187 170 171  --     Basic Metabolic Panel: Recent Labs  Lab 01/21/22 1445 01/22/22 0827 01/26/22 1557 01/28/22 1330 01/28/22 1337  NA 138 139 142 141 138  K 3.7 4.3 3.3* 3.8 4.0  CL 105 107 109 109 109  CO2 22 20* 22 23  --   GLUCOSE 94 113* 95 115* 110*  BUN 18 15 9 8 10   CREATININE 0.82 0.87 0.61 0.84 0.70  CALCIUM 10.0 8.6* 9.6 9.4  --   MG 2.1  --   --   --   --    GFR: Estimated Creatinine Clearance: 58.7 mL/min (by C-G formula based on SCr of 0.7 mg/dL). Recent Labs  Lab 01/21/22 1445 01/22/22 0351 01/26/22 1118 01/28/22 1330  WBC 4.9 4.9 5.4 4.7    Liver Function Tests: Recent Labs  Lab 01/21/22 1445 01/28/22 1330  AST  19 22  ALT 16 18  ALKPHOS 47 45  BILITOT 1.0 0.3  PROT 6.8 6.4*  ALBUMIN 4.1 3.9   No results for input(s): "LIPASE", "AMYLASE" in the last 168 hours. No results for input(s): "AMMONIA" in the last 168 hours.  ABG    Component Value Date/Time   TCO2 22 01/28/2022 1337     Coagulation Profile: Recent Labs  Lab 01/21/22 1445 01/28/22 1330  INR 1.1 1.2    Cardiac Enzymes: No results for input(s): "CKTOTAL", "CKMB", "CKMBINDEX", "TROPONINI" in the last 168 hours.  HbA1C: Hgb A1c MFr Bld  Date/Time Value Ref Range Status  01/22/2022 03:51 AM 5.0 4.8 - 5.6 % Final    Comment:    (NOTE) Pre diabetes:  5.7%-6.4%  Diabetes:              >6.4%  Glycemic control for   <7.0% adults with diabetes     CBG: Recent Labs  Lab 01/28/22 1328  GLUCAP 125*    Review of Systems:   Patient is encephalopathic and/or intubated. Therefore history has been obtained from chart review.    Past Medical History:  She,  has a past medical history of Anxiety, Back pain, Discitis of lumbar region (11/16/2013), Exertional asthma, GERD (gastroesophageal reflux disease), Hypertension, Kidney stones, Neck pain, Osteomyelitis ([place]) (11/23/2013), and Sleep concern.   Surgical History:   Past Surgical History:  Procedure Laterality Date   BREAST CYST EXCISION Left 2010   "polypectomy"   IR CT HEAD LTD  01/21/2022   IR PERCUTANEOUS ART THROMBECTOMY/INFUSION INTRACRANIAL INC DIAG ANGIO  01/21/2022   IR US GUIDE VASC ACCESS RIGHT  01/22/2022   PICC LINE PLACE PERIPHERAL ([place] HX) Right    for use of Levaquin & Vancomycin, of note: she reports that she had a "flulike feeling"    RADIOLOGY WITH ANESTHESIA N/A 01/21/2022   Procedure: IR WITH ANESTHESIA;  Surgeon: Luanne Bras, MD;  Location: [place];  Service: Radiology;  Laterality: N/A;   TONSILLECTOMY AND ADENOIDECTOMY  1975   TUBAL LIGATION  1999     Social History:   reports that she has never smoked. She has never used  smokeless tobacco. She reports current alcohol use of about 6.0 standard drinks of alcohol per week. She reports that she does not use drugs.   Family History:  Her family history includes Hypertension in her father; Other in her father and mother.   Allergies Allergies  Allergen Reactions   Doxycycline Nausea Only    malaise    Erythromycin Nausea And Vomiting   Iodine    Latex Other (See Comments)    Irritation of the skin   Shellfish Allergy Other (See Comments)    GI- Facial  Acne   Strawberry Extract Other (See Comments)    Mouth ulcers    Vancomycin    Vicodin [Hydrocodone-Acetaminophen] Other (See Comments)    Restless, tolerate percocet   Dilaudid [Hydromorphone Hcl] Rash    *Pt states she can take if given benadryl prior*   Keflex [Cephalexin] Rash   Penicillins Rash    Hives   Sulfa Antibiotics Rash    Hives     Home Medications  Prior to Admission medications   Medication Sig Start Date End Date Taking? Authorizing Provider  ALPRAZolam Duanne Moron) 0.5 MG tablet Take 0.5 mg by mouth at bedtime. 12/23/21   [provider]  apixaban (ELIQUIS) 5 MG TABS tablet Take 1 tablet (5 mg total) by mouth 2 (two) times daily. 01/23/22   Rolanda Lundborg, MD  cyclobenzaprine (FLEXERIL) 10 MG tablet Take 1 tablet (10 mg total) by mouth 3 (three) times daily as needed for muscle spasms. 11/17/14   Eustace Moore, MD  guaiFENesin (MUCINEX) 600 MG 12 hr tablet Take 600 mg by mouth as needed for cough or to loosen phlegm.    [provider]  loratadine (CLARITIN) 10 MG tablet Take 10 mg by mouth every morning.    [provider]  MYRBETRIQ 25 MG TB24 tablet Take 25 mg by mouth daily. 12/31/21   [provider]  pregabalin (LYRICA) 25 MG capsule Take 1 capsule (25 mg total) by mouth 2 (two) times daily. 01/26/22 02/25/22  Noemi Chapel, MD  rosuvastatin (CRESTOR) 20 MG  tablet Take 1 tablet (20 mg total) by mouth daily. 01/24/22   Rolanda Lundborg, MD   sacubitril-valsartan (ENTRESTO) 24-26 MG Take 1 tablet by mouth 2 (two) times daily. 01/23/22   Rolanda Lundborg, MD     Critical care time: 35 minutes    JD Rexene Agent [facility] 01/28/2022, 2:52 PM  Please see [email or web address] for pager details.  From 7A-7P if no response, please call [phone]. After hours, please call ELink [phone].

## 2022-01-28 NOTE — Progress Notes (Signed)
Patient transported to 4N room 26. Patient intubated/sedated. Report given to Byrd Hesselbach, [place]. Bilateral groin sites (clean, dry, intact), pulses palpable . Nursing staff informed that right arm swelling is related to IV infiltration of contrast during CT scan that was done prior to patient arrival in IR.

## 2022-01-28 NOTE — Sedation Documentation (Addendum)
Left femoral sheath removed. Manual pressure being held at left femoral artery.

## 2022-01-28 NOTE — Code Documentation (Signed)
Stroke Response Nurse Documentation Code Documentation  Chloe Johnson is a 66 y.o. female arriving to  via [facility] on 01/28/22 with past medical hx of CVA, MI, HF, HTN, HTD. On Eliquis (apixaban) daily. Code stroke was activated by EMS.   Patient from home where she was LKW at 1240. Husband reports that she was taking her blood pressure and dropped the machine. At this time she was experiencing weakness and aphasia.   Stroke team at the bedside on patient arrival. Labs drawn and patient cleared for CT by Dr. Jeraldine Loots. Patient to CT with team. NIHSS 28, see documentation for details and code stroke times. Patient with not following commands, bilateral hemianopia, left, right arm weakness, bilateral leg weakness, Global aphasia , and Sensory  neglect on exam. The following imaging was completed:  CT Head. Patient is not a candidate for IV Thrombolytic due to taking Eliquis 01/28/22. Patient is a candidate for IR due to symptom presentation. Code IR activated at 1349. See IR log for details.  Care Plan: Post-IR documentation   Bedside handoff with IR RN.    Toniann Fail  Stroke Response RN

## 2022-01-28 NOTE — ED Provider Notes (Signed)
[facility] EMERGENCY DEPARTMENT Provider Note   CSN: [id number] Arrival date & time: 01/28/22  1325     History {Add pertinent medical, surgical, social history, OB history to HPI:1} Chief Complaint  Patient presents with   Code Stroke    Chloe Johnson is a 66 y.o. female.  HPI Patient presents 3 days after be seen and evaluated now with concern for aphasia, weakness.  History obtained by EMS as the patient is nonverbal, level 5 caveat. Per report the patient was in her usual state of health until 1 hour prior to ED arrival.  At that point she suddenly became aphasic, and capable of moving her left side.  EMS reports no hemodynamic instability in route, but no change in neuro status either.    Home Medications Prior to Admission medications   Medication Sig Start Date End Date Taking? Authorizing Provider  ALPRAZolam Prudy Feeler) 0.5 MG tablet Take 0.5 mg by mouth at bedtime. 12/23/21   [provider]  apixaban (ELIQUIS) 5 MG TABS tablet Take 1 tablet (5 mg total) by mouth 2 (two) times daily. 01/23/22   Karie Fetch, MD  cyclobenzaprine (FLEXERIL) 10 MG tablet Take 1 tablet (10 mg total) by mouth 3 (three) times daily as needed for muscle spasms. 11/17/14   Tia Alert, MD  guaiFENesin (MUCINEX) 600 MG 12 hr tablet Take 600 mg by mouth as needed for cough or to loosen phlegm.    [provider]  loratadine (CLARITIN) 10 MG tablet Take 10 mg by mouth every morning.    [provider]  MYRBETRIQ 25 MG TB24 tablet Take 25 mg by mouth daily. 12/31/21   [provider]  pregabalin (LYRICA) 25 MG capsule Take 1 capsule (25 mg total) by mouth 2 (two) times daily. 01/26/22 02/25/22  Eber Hong, MD  rosuvastatin (CRESTOR) 20 MG tablet Take 1 tablet (20 mg total) by mouth daily. 01/24/22   Karie Fetch, MD  sacubitril-valsartan (ENTRESTO) 24-26 MG Take 1 tablet by mouth 2 (two) times daily. 01/23/22   Karie Fetch, MD      Allergies     Doxycycline, Erythromycin, Iodine, Latex, Shellfish allergy, Strawberry extract, Vancomycin, Vicodin [hydrocodone-acetaminophen], Dilaudid [hydromorphone hcl], Keflex [cephalexin], Penicillins, and Sulfa antibiotics    Review of Systems   Review of Systems  Unable to perform ROS: Patient nonverbal    Physical Exam Updated Vital Signs Wt 59 kg   BMI 24.58 kg/m  Physical Exam Vitals and nursing note reviewed.  Constitutional:      General: She is not in acute distress.    Appearance: She is well-developed.  HENT:     Head: Normocephalic and atraumatic.  Eyes:     Conjunctiva/sclera: Conjunctivae normal.  Cardiovascular:     Rate and Rhythm: Normal rate and regular rhythm.  Pulmonary:     Effort: Pulmonary effort is normal. No respiratory distress.     Breath sounds: Normal breath sounds. No stridor.  Abdominal:     General: There is no distension.  Skin:    General: Skin is warm and dry.  Neurological:     Mental Status: She is alert.     Cranial Nerves: No cranial nerve deficit.     Comments: Patient communicates with blinking in response to questions.  She is flaccid on the right side.  She does have some voluntary movement of the left, and does move her head occasionally to the right, but has a left gaze preference.  Psychiatric:  Mood and Affect: Mood normal.     ED Results / Procedures / Treatments   Labs (all labs ordered are listed, but only abnormal results are displayed) Labs Reviewed  CBC - Abnormal; Notable for the following components:      Result Value   HCT 35.5 (*)    All other components within normal limits  CBG MONITORING, ED - Abnormal; Notable for the following components:   Glucose-Capillary 125 (*)    All other components within normal limits  POCT I-STAT, CHEM 8 - Abnormal; Notable for the following components:   Glucose, Bld 110 (*)    Calcium, Ion 0.94 (*)    Hemoglobin 11.9 (*)    HCT 35.0 (*)    All other components within normal  limits  DIFFERENTIAL  PROTIME-INR  APTT  COMPREHENSIVE METABOLIC PANEL  ETHANOL  I-STAT CHEM 8, ED    EKG None  Radiology CT HEAD CODE STROKE WO CONTRAST  Result Date: 01/28/2022 CLINICAL DATA:  Code stroke.  Neuro deficit, acute, stroke suspected EXAM: CT HEAD WITHOUT CONTRAST TECHNIQUE: Contiguous axial images were obtained from the base of the skull through the vertex without intravenous contrast. RADIATION DOSE REDUCTION: This exam was performed according to the departmental dose-optimization program which includes automated exposure control, adjustment of the mA and/or kV according to patient size and/or use of iterative reconstruction technique. COMPARISON:  CT head 01/21/2022. FINDINGS: Brain: No evidence of acute large vascular territory infarction, hemorrhage, hydrocephalus, extra-axial collection or mass lesion/mass effect. Vascular: No hyperdense vessel identified. Skull: No acute fracture. Sinuses/Orbits: Clear sinuses.  No acute orbital findings. Other: No mastoid effusions. ASPECTS [facility] Stroke Program Early CT Score) total score (0-10 with 10 being normal): 10. IMPRESSION: 1. No evidence of acute intracranial abnormality. 2. ASPECTS is 10. Code stroke imaging results were communicated on 01/28/2022 at 1:43 pm to provider Bhagat via secure text paging. Electronically Signed   By: Margaretha Sheffield M.D.   On: 01/28/2022 13:43    Procedures Procedures  {Document cardiac monitor, telemetry assessment procedure when appropriate:1}  Medications Ordered in ED Medications  sodium chloride flush (NS) 0.9 % injection 3 mL (has no administration in time range)  fentaNYL (SUBLIMAZE) 100 MCG/2ML injection (has no administration in time range)  ceFAZolin (ANCEF) 2-4 GM/100ML-% IVPB (has no administration in time range)  nitroGLYCERIN 100 mcg/mL intra-arterial injection (has no administration in time range)  lidocaine (XYLOCAINE) 1 % (with pres) injection (has no administration in time  range)    ED Course/ Medical Decision Making/ A&P This patient with a Hx of stroke, heart failure, NSTEMI, on Eliquis presents to the ED for concern of aphasia, extremity weakness, this involves an extensive number of treatment options, and is a complaint that carries with it a high risk of complications and morbidity.    The differential diagnosis includes stroke, hemorrhage, infection   Social Determinants of Health:  No limiting factors, patient is a former Marine scientist  Additional history obtained:  Additional history and/or information obtained from EMS with details included in HPI, and husband, who arrives after the patient was initially seen, and is in CAT scan.  He notes that the patient was well until onset of symptoms   After the initial evaluation, orders, including: CT labs and coordination with our neurologists were initiated.    On repeat evaluation of the patient stayed the same  2:06 PM Husband arrived, was brought up to speed.  Lab Tests:  I personally interpreted labs.  The pertinent results include:  ***  Imaging Studies ordered:  I independently visualized and interpreted imaging which showed *** I agree with the radiologist interpretation  Consultations Obtained:  I requested consultation with the ***,  and discussed lab and imaging findings as well as pertinent plan - they recommend: ***  Dispostion / Final MDM:  After consideration of the diagnostic results and the patient's response to treatment, ***  Final Clinical Impression(s) / ED Diagnoses Final diagnoses:  None    Rx / DC Orders ED Discharge Orders     None

## 2022-01-28 NOTE — Progress Notes (Signed)
ANTICOAGULATION CONSULT NOTE - Initial Consult  Pharmacy Consult for heparin Indication: LV thrombus  Allergies  Allergen Reactions   Doxycycline Nausea Only    malaise    Erythromycin Nausea And Vomiting   Iodine    Latex Other (See Comments)    Irritation of the skin   Shellfish Allergy Other (See Comments)    GI- Facial  Acne   Strawberry Extract Other (See Comments)    Mouth ulcers    Vancomycin    Vicodin [Hydrocodone-Acetaminophen] Other (See Comments)    Restless, tolerate percocet   Dilaudid [Hydromorphone Hcl] Rash    *Pt states she can take if given benadryl prior*   Keflex [Cephalexin] Rash   Penicillins Rash    Hives   Sulfa Antibiotics Rash    Hives    Patient Measurements: Height: 5\' 1"  (154.9 cm) Weight: 59 kg (130 lb 1.1 oz) IBW/kg (Calculated) : 47.8 Heparin Dosing Weight: 60.9kg  Vital Signs: Temp: 97.6 F (36.4 C) (09/05 2000) Temp Source: Oral (09/05 2000) BP: 113/60 (09/05 1957) Pulse Rate: 63 (09/05 1957)  Labs: Recent Labs    01/26/22 1118 01/26/22 1557 01/28/22 1330 01/28/22 1337 01/28/22 1704 01/28/22 1747  HGB 13.8  --  12.0 11.9*  --  10.2*  HCT 44.1  --  35.5* 35.0*  --  30.0*  PLT 170  --  171  --   --   --   APTT  --   --  24  --   --   --   LABPROT  --   --  15.0  --   --   --   INR  --   --  1.2  --   --   --   CREATININE  --  0.61 0.84 0.70  --   --   TROPONINIHS 23* 28*  --   --  69*  --      Estimated Creatinine Clearance: 58.7 mL/min (by C-G formula based on SCr of 0.7 mg/dL).   Medical History: 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/16/2013   L3-4/notes 11/24/2013, arms, neck   Exertional asthma    GERD (gastroesophageal reflux disease)    Hypertension    Kidney stones    "have always passed them"   Neck pain    Osteomyelitis (HCC) 11/23/2013   osteomyelitis, discitis    Sleep concern    uses Prozac for sleep     Medications:  Medications Prior  to Admission  Medication Sig Dispense Refill Last Dose   ALPRAZolam (XANAX) 0.5 MG tablet Take 0.5 mg by mouth at bedtime.      apixaban (ELIQUIS) 5 MG TABS tablet Take 1 tablet (5 mg total) by mouth 2 (two) times daily. 60 tablet 1    cyclobenzaprine (FLEXERIL) 10 MG tablet Take 1 tablet (10 mg total) by mouth 3 (three) times daily as needed for muscle spasms. 90 tablet 5    guaiFENesin (MUCINEX) 600 MG 12 hr tablet Take 600 mg by mouth as needed for cough or to loosen phlegm.      loratadine (CLARITIN) 10 MG tablet Take 10 mg by mouth every morning.      MYRBETRIQ 25 MG TB24 tablet Take 25 mg by mouth daily.      pregabalin (LYRICA) 25 MG capsule Take 1 capsule (25 mg total) by mouth 2 (two) times daily. 60 capsule 0    rosuvastatin (  CRESTOR) 20 MG tablet Take 1 tablet (20 mg total) by mouth daily. 30 tablet 1    sacubitril-valsartan (ENTRESTO) 24-26 MG Take 1 tablet by mouth 2 (two) times daily. 60 tablet 1    Scheduled:   [START ON 01/29/2022]  stroke: early stages of recovery book   Does not apply Once   Chlorhexidine Gluconate Cloth  6 each Topical Daily   docusate  100 mg Per Tube BID   pantoprazole  40 mg Per Tube Daily   polyethylene glycol  17 g Per Tube Daily   rosuvastatin  20 mg Per Tube Daily   sodium chloride flush  10-40 mL Intracatheter Q12H    Assessment: 36 yoF with PMH for 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 Now s/p Neuro IR on 9/5 per MD notes on 9/5 "post CT of the brain no evidence of intracranial hemorrhage". Pharmacy consulted to dose heparin for LV thrombus with prior anticoagulation.   On Eliquis 5mg  BID and last dose this AM (time unknown).   Goal of Therapy:  Low goal/ No bolus Heparin level 0.1-0.25 units/ml aPTT 47-61 seconds Monitor platelets by anticoagulation protocol: Yes   Plan:  No bolus per MD Begin heparin  infusion 500 units/hour 6hr aPTT @0300  Monitor CBC, daily HL/aPTT, s/sx bleeding    07-16-1972, PharmD Clinical Pharmacist 01/28/2022 8:20 PM

## 2022-01-28 NOTE — Anesthesia Preprocedure Evaluation (Signed)
Anesthesia Evaluation  Patient identified by MRN, date of birth, ID band Patient confused  Preop documentation limited or incomplete due to emergent nature of procedure.  Airway Mallampati: II  TM Distance: >3 FB Neck ROM: Limited    Dental no notable dental hx.    Pulmonary asthma ,    Pulmonary exam normal        Cardiovascular hypertension, + Past MI and +CHF   Rhythm:Regular Rate:Tachycardia     Neuro/Psych Anxiety CODE STROKE CVA (LV thrombus with right MCA stroke 08/23), Residual Symptoms    GI/Hepatic Neg liver ROS, GERD  ,  Endo/Other  negative endocrine ROS  Renal/GU   negative genitourinary   Musculoskeletal  (+) Arthritis , Osteoarthritis,  Cervical radiculopathy    Abdominal Normal abdominal exam  (+)   Peds  Hematology negative hematology ROS (+)   Anesthesia Other Findings   Reproductive/Obstetrics                             Anesthesia Physical Anesthesia Plan  ASA: 4 and emergent  Anesthesia Plan: General   Post-op Pain Management:    Induction: Intravenous and Rapid sequence  PONV Risk Score and Plan: 3 and Ondansetron, Dexamethasone and Treatment may vary due to age or medical condition  Airway Management Planned: Mask and Oral ETT  Additional Equipment: Arterial line  Intra-op Plan:   Post-operative Plan: Possible Post-op intubation/ventilation  Informed Consent:     Only emergency history available  Plan Discussed with: CRNA  Anesthesia Plan Comments: (Lab Results      Component                Value               Date                      WBC                      4.7                 01/28/2022                HGB                      11.9 (L)            01/28/2022                HCT                      35.0 (L)            01/28/2022                MCV                      88.5                01/28/2022                PLT                      171                  01/28/2022           Lab Results      Component  Value               Date                      NA                       138                 01/28/2022                K                        4.0                 01/28/2022                CO2                      23                  01/28/2022                GLUCOSE                  110 (H)             01/28/2022                BUN                      10                  01/28/2022                CREATININE               0.70                01/28/2022                CALCIUM                  9.4                 01/28/2022                GFRNONAA                 >60                 01/28/2022          )        Anesthesia Quick Evaluation

## 2022-01-28 NOTE — Transfer of Care (Signed)
Immediate Anesthesia Transfer of Care Note  Patient: Chloe Johnson  Procedure(s) Performed: IR WITH ANESTHESIA  Patient Location: ICU  Anesthesia Type:General  Level of Consciousness: Patient remains intubated per anesthesia plan  Airway & Oxygen Therapy: Patient remains intubated per anesthesia plan and Patient placed on Ventilator (see vital sign flow sheet for setting)  Post-op Assessment: Report given to RN and Post -op Vital signs reviewed and stable  Post vital signs: Reviewed and stable  Last Vitals:  Vitals Value Taken Time  BP 187/93 01/28/22 1645  Temp    Pulse 86 01/28/22 1645  Resp 16 01/28/22 1646  SpO2 100 % 01/28/22 1645  Vitals shown include unvalidated device data.  Last Pain: There were no vitals filed for this visit.       Complications: No notable events documented.

## 2022-01-28 NOTE — Progress Notes (Signed)
RT transported pt to and from MRI without complications. ?

## 2022-01-28 NOTE — Progress Notes (Signed)
Peripherally Inserted Central Catheter Placement  The IV Nurse has discussed with the patient and/or persons authorized to consent for the patient, the purpose of this procedure and the potential benefits and risks involved with this procedure.  The benefits include less needle sticks, lab draws from the catheter, and the patient may be discharged home with the catheter. Risks include, but not limited to, infection, bleeding, blood clot (thrombus formation), and puncture of an artery; nerve damage and irregular heartbeat and possibility to perform a PICC exchange if needed/ordered by physician.  Alternatives to this procedure were also discussed.  Bard Power PICC patient education guide, fact sheet on infection prevention and patient information card has been provided to patient /or left at bedside.   Consent signed by husband at bedside due to altered mental status.  PICC Placement Documentation  PICC Triple Lumen 01/28/22 Left Brachial 35 cm 0 cm (Active)  Indication for Insertion or Continuance of Line Limited venous access - need for IV therapy >5 days (PICC only);Poor Vasculature-patient has had multiple peripheral attempts or PIVs lasting less than 24 hours 01/28/22 1912  Exposed Catheter (cm) 0 cm 01/28/22 1912  Site Assessment Clean;Dry;Intact 01/28/22 1912  Lumen #1 Status Flushed;Saline locked;Blood return noted 01/28/22 1912  Lumen #2 Status Flushed;Saline locked;Blood return noted 01/28/22 1912  Lumen #3 Status Flushed;Saline locked;Blood return noted 01/28/22 1912  Dressing Type Transparent;Securing device 01/28/22 1912  Dressing Status Antimicrobial disc in place;Clean, Dry, Intact 01/28/22 1912  Safety Lock Not Applicable 01/28/22 1912  Line Care Connections checked and tightened 01/28/22 1912  Dressing Intervention New dressing 01/28/22 1912  Dressing Change Due 02/04/22 01/28/22 1912       Chloe Johnson, Lajean Manes 01/28/2022, 7:13 PM

## 2022-01-28 NOTE — ED Triage Notes (Signed)
Pt BIB EMS due to a Code stroke. Pt had right sided weakness and aphasia lsn was 1230. Pt was here recently for stroke, on elliquis.

## 2022-01-28 NOTE — H&P (Addendum)
Neurology H&P  CC:   History is obtained from: Husband and chart review  HPI: Chloe Johnson is a 66 y.o. 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 Amada Jupiter, with key details confirmed by husband Chloe Johnson  is a 76 y.o.  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 She presents today after the following series events.  Around 12 PM this past Saturday, the patient noted sudden onset dizziness described as "vertigo" followed by nausea then followed by an episode of vomiting.  She next felt a sudden onset sharp chest pain with radiation to her back.  She assumed it was her cervical radiculopathy "acting up" because shortly after this event, she was holding 2 plates in her hand to feed her cats when she suddenly had no control of both upper extremities and dropped said plates.  On Sunday, she felt generally weak but then improved from all of this until this morning, around 9 AM, when she experienced word finding difficulties.  Her husband, who is at bedside, also notes she had a right facial droop transiently that lasted no more than a few minutes.  She may have had 2 subsequent episodes, as documented by ED physician; however, all of her is told by the patient's husband that this was 1 single episode."  Her hospital course was noted for acute worsening for which she underwent thrombectomy and was discharged with only some very mild  residual aphasia not detectable on examination but noticed by the patient per husband.  He notes that she has been in her normal state of health since discharge other than a visit to the ED on 9/3 with left shoulder pain which was attributed to her known cervical stenosis for which she is planned for surgery in the near future   Husband reports that she was taking his blood pressure when suddenly the blood pressure monitor fell off the table.  She tried to catch it but lost her balance had weakness in both of her legs.  Initially she got to both both arms but then became weak on the right side.  She was unable to speak at all suddenly during this event.  Work-up revealed a left ventricle apical thrombus.  Has been reports she has not missed any of her doses of medication including Eliquis  CTA was attempted to confirm recurrent acute occlusion, however due to IV infiltration and severity of her symptoms decision was made to proceed directly to IR which was discussed both with [name] as well as with the patient's husband  LKW: 12:40 AM Thrombolytic given?: No, on Eliquis and recent stroke  Checklist of contraindications was reviewed and negative. Risks, benefits and alternatives were discussed  IA performed?:  Yes Premorbid modified rankin scale:      1 - No significant disability. Able to carry out all usual activities, despite some symptoms (very mild word finding issues noticeable only to the patient)  ROS: Unable to obtain due to altered mental status.   Past Medical History:  Diagnosis Date   Anxiety    Back pain  Discitis of lumbar region 11/16/2013   L3-4/notes 11/24/2013, arms, neck   Exertional asthma    GERD (gastroesophageal reflux disease)    Hypertension    Kidney stones    "have always passed them"   Neck pain    Osteomyelitis ([place]) 11/23/2013   osteomyelitis, discitis    Sleep concern    uses Prozac for sleep    Past Surgical History:  Procedure Laterality Date    BREAST CYST EXCISION Left 2010   "polypectomy"   IR CT HEAD LTD  01/21/2022   IR PERCUTANEOUS ART THROMBECTOMY/INFUSION INTRACRANIAL INC DIAG ANGIO  01/21/2022   IR US GUIDE VASC ACCESS RIGHT  01/22/2022   PICC LINE PLACE PERIPHERAL ([facility] HX) Right    for use of Levaquin & Vancomycin, of note: she reports that she had a "flulike feeling"    RADIOLOGY WITH ANESTHESIA N/A 01/21/2022   Procedure: IR WITH ANESTHESIA;  Surgeon: Luanne Bras, MD;  Location: [facility];  Service: Radiology;  Laterality: N/A;   TONSILLECTOMY AND ADENOIDECTOMY  1975   TUBAL LIGATION  1999   Current Outpatient Medications  Medication Instructions   ALPRAZolam (XANAX) 0.5 mg, Oral, Daily at bedtime   apixaban (ELIQUIS) 5 mg, Oral, 2 times daily   cyclobenzaprine (FLEXERIL) 10 mg, Oral, 3 times daily PRN   guaiFENesin (MUCINEX) 600 mg, Oral, As needed   loratadine (CLARITIN) 10 mg, Oral, BH-each morning   Myrbetriq 25 mg, Oral, Daily   pregabalin (LYRICA) 25 mg, Oral, 2 times daily   rosuvastatin (CRESTOR) 20 mg, Oral, Daily   sacubitril-valsartan (ENTRESTO) 24-26 MG 1 tablet, Oral, 2 times daily     Family History  Problem Relation Age of Onset   Other Mother    Other Father    Hypertension Father     Social History:  reports that she has never smoked. She has never used smokeless tobacco. She reports current alcohol use of about 6.0 standard drinks of alcohol per week. She reports that she does not use drugs.   Exam: Current vital signs: Wt 59 kg   BMI 24.58 kg/m  Vital signs in last 24 hours: Weight:  [59 kg] 59 kg (09/05 1332)   Physical Exam  Constitutional: Appears well-developed and well-nourished.  Psych: Affect appropriate to situation, tearful Eyes: No scleral injection HENT: No oropharyngeal obstruction.  MSK: no joint deformities.  Cardiovascular: Normal rate and regular rhythm. Perfusing extremities well Respiratory: Effort normal, non-labored breathing GI: Soft.  No distension.  There is no tenderness.  Skin: Warm dry and intact visible skin  Neuro: Mental Status: Patient is awake, alert, able to follow simple commands (close your eyes, but not make a fist), mute Cranial Nerves: II: Visual Fields are notable for a right hemianopia. Pupils are equal, round III,IV, VI: Left gaze preference but is able to track examiner all the way to the right side V: Facial sensation is symmetric to light eyelash brush VII: Facial movement is notable for a right facial droop.  VIII: hearing is intact to voice Remainder unable to assess due to mental status Motor/sensory Can maintain the left upper extremity antigravity for 10  seconds.  No movement to maximal noxious stimulation in any of the other 3 extremities, and no spontaneous movement in any of the other 3 extremities Plantars: Toes are mute bilaterally.  Cerebellar: Unable to assess due to mental status Gait:  Deferred   NIHSS total 26 Score breakdown: 2 points for not answering questions, one-point for only following a close  your eyes command, one-point for left gaze preference, 2 points for right hemianopia, 2 one-point for right facial droop, 4 points for right arm weakness, 4 points for left leg weakness, 4 points for right leg weakness, 2 points for severe sensory loss, 3 points for being mute, 2 points for dysarthria Performed at time of patient arrival to ED    I have reviewed labs in epic and the results pertinent to this consultation are:  Basic Metabolic Panel: Recent Labs  Lab 01/21/22 1445 01/22/22 0827 01/26/22 1557 01/28/22 1330 01/28/22 1337  NA 138 139 142 141 138  K 3.7 4.3 3.3* 3.8 4.0  CL 105 107 109 109 109  CO2 22 20* 22 23  --   GLUCOSE 94 113* 95 115* 110*  BUN 18 15 9 8 10   CREATININE 0.82 0.87 0.61 0.84 0.70  CALCIUM 10.0 8.6* 9.6 9.4  --   MG 2.1  --   --   --   --     CBC: Recent Labs  Lab 01/21/22 1445 01/22/22 0351 01/26/22 1118 01/28/22 1330 01/28/22 1337  WBC 4.9  4.9 5.4 4.7  --   NEUTROABS 2.9  --   --  2.5  --   HGB 12.5 12.0 13.8 12.0 11.9*  HCT 37.2 35.8* 44.1 35.5* 35.0*  MCV 85.5 87.1 93.2 88.5  --   PLT 175 187 170 171  --     Coagulation Studies: Recent Labs    01/28/22 1330  LABPROT 15.0  INR 1.2      I have reviewed the images obtained: Head CT personally reviewed, agree with radiology no acute intracranial process   Impression: High concern for recurrent stroke from patient's known LV thrombus despite anticoagulation on Eliquis.  Primary differential would be acute cervical spine pathology but this would not explain her acute aphasia.  Recommendations:    # Stroke determined by clinical assessment - Emergent diagnostic angiogram - Stroke labs recently completed so will not reorder - MRI brain and MRA head when stable - Frequent neuro checks per protocol - Troponin x2, hold off on echocardiogram but consider reaching out to cardiology - Hold antiplatelets for now pending neuro IR recommendations and procedure.    - Risk factor modification - Telemetry monitoring; 30 day event monitor on discharge if no arrythmias captured  - Blood pressure goal  - Post successful uncomplicated revascularization SBP 120 - 140 for 24 hours; if complications have arisen or only partial revascularization reach out to interventionalist or neurologist on call for BP goal - PT consult, OT consult, Speech consult, unless patient is back to baseline - Intubated for procedure but hope for extubation postprocedure - Admitted to stroke team, critical care medicine consulted for medical management given her high complexity  # Known cervical disc disease -Discussed with anesthesia prior to intubation for cervical intubation precautions -If angiogram is unrevealing for etiology of bilateral leg weakness, she will need MRI cervical spine postprocedure  #Recent MI -Appreciate CCM management  #LV thrombus -Low goal no bolus heparin drip ordered but  will discuss with interventional radiology in case of procedural complications  Lesleigh Noe MD-PhD [facility] [phone] Available 7 AM to 7 PM, outside these hours please contact Neurologist on call listed on AMION   Total critical care time: 60 minutes   Critical care time was exclusive of separately billable procedures and treating other patients.   Critical care was necessary to treat or prevent imminent or life-threatening deterioration.   Critical care  was time spent personally by me on the following activities: development of treatment plan with patient and/or surrogate as well as nursing, discussions with consultants/primary team, evaluation of patient's response to treatment, examination of patient, obtaining history from patient or surrogate, ordering and performing treatments and interventions, ordering and review of laboratory studies, ordering and review of radiographic studies, and re-evaluation of patient's condition as needed, as documented above.

## 2022-01-28 NOTE — Anesthesia Procedure Notes (Signed)
Procedure Name: Intubation Date/Time: 01/28/2022 2:00 PM  Performed by: Atilano Median, DOPre-anesthesia Checklist: Patient identified, Emergency Drugs available, Suction available, Patient being monitored and Timeout performed Patient Re-evaluated:Patient Re-evaluated prior to induction Oxygen Delivery Method: Ambu bag Preoxygenation: Pre-oxygenation with 100% oxygen Induction Type: IV induction, Rapid sequence and Cricoid Pressure applied Ventilation: Mask ventilation without difficulty Laryngoscope Size: Glidescope and 3 Grade View: Grade I Tube type: Subglottic suction tube Tube size: 7.0 mm Number of attempts: 1 Airway Equipment and Method: Stylet and Video-laryngoscopy Placement Confirmation: ETT inserted through vocal cords under direct vision, breath sounds checked- equal and bilateral and CO2 detector Secured at: 21 cm Tube secured with: Tape Dental Injury: Teeth and Oropharynx as per pre-operative assessment

## 2022-01-28 NOTE — ED Notes (Signed)
Pt transported to IR 

## 2022-01-28 NOTE — Progress Notes (Addendum)
ANTICOAGULATION CONSULT NOTE - Initial Consult  Pharmacy Consult for heparin Indication: LV thrombus  Allergies  Allergen Reactions   Doxycycline Nausea Only    malaise    Erythromycin Nausea And Vomiting   Iodine    Latex Other (See Comments)    Irritation of the skin   Shellfish Allergy Other (See Comments)    GI- Facial  Acne   Strawberry Extract Other (See Comments)    Mouth ulcers    Vancomycin    Vicodin [Hydrocodone-Acetaminophen] Other (See Comments)    Restless, tolerate percocet   Dilaudid [Hydromorphone Hcl] Rash    *Pt states she can take if given benadryl prior*   Keflex [Cephalexin] Rash   Penicillins Rash    Hives   Sulfa Antibiotics Rash    Hives    Patient Measurements: Weight: 59 kg (130 lb 1.1 oz) Heparin Dosing Weight: 60.9kg  Vital Signs:    Labs: Recent Labs    01/26/22 1118 01/26/22 1557 01/28/22 1330 01/28/22 1337  HGB 13.8  --  12.0 11.9*  HCT 44.1  --  35.5* 35.0*  PLT 170  --  171  --   APTT  --   --  24  --   LABPROT  --   --  15.0  --   INR  --   --  1.2  --   CREATININE  --  0.61 0.84 0.70  TROPONINIHS 23* 28*  --   --     Estimated Creatinine Clearance: 58.7 mL/min (by C-G formula based on SCr of 0.7 mg/dL).   Medical History: 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/16/2013   L3-4/notes 11/24/2013, arms, neck   Exertional asthma    GERD (gastroesophageal reflux disease)    Hypertension    Kidney stones    "have always passed them"   Neck pain    Osteomyelitis ([facility]) 11/23/2013   osteomyelitis, discitis    Sleep concern    uses Prozac for sleep     Medications:  (Not in a hospital admission)  Scheduled:   [START ON 01/29/2022]  stroke: early stages of recovery book   Does not apply Once   fentaNYL       lidocaine       nitroGLYCERIN       pantoprazole (PROTONIX) IV  40 mg Intravenous QHS   sodium chloride flush  3 mL Intravenous Once    Assessment: 83  yoF with PMH for 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 Pharmacy consulted to dose heparin for LV thrombus with prior anticoagulation. On Eliquis 5mg  BID and last dose this AM (time unknown).   Goal of Therapy: Low goal/ No bolus Heparin level 0.1-0.25 units/ml aPTT 47-61 seconds Monitor platelets by anticoagulation protocol: Yes   Plan:  Neurology would like to confirm no complication from procedure prior to initiating heparin drip. Heparin drip not to start until tonight. Will hold for now.  07-16-1972, PharmD. [facility] Acute Care PGY-1 01/28/2022 3:15 PM

## 2022-01-28 NOTE — Procedures (Signed)
INR. Status post bilateral common carotid arteriograms and right vertebral artery angiogram.  Left CFA approach.  Findings.  Occluded superior division of the left middle cerebral artery in the M2 region. No occlusive thrombus in a parietal branch of the inferior division of the left middle cerebral artery in the distal M3 M4 region Status post complete revascularization of the superior division of the left middle cerebral artery M2 segment with 1 pass with a 3 mm x 40 mm treatment retrieval device and proximal aspiration achieving aTICI 2C revascularization. Post CT of the brain no evidence of intracranial hemorrhage. 8 French Angio-Seal closure device deployed for hemostasis of the left groin puncture site.  Distal pulses all present bilaterally unchanged from prior to procedure. Patient left intubated due to lack of responsiveness related to the medical condition. Pupils 3 mm sluggish bilaterally equal.  No gross facial asymmetry.  Spontaneously seen to bend her left knee and also her left forearm. Chloe Sanger MD.

## 2022-01-28 NOTE — Sedation Documentation (Addendum)
Patient belongings bag taken back to emergency room by patient ED RN. Nurse states she will give bag to patient husband. No patient belongings present in IR.   On arrival to IR patient right arm swollen from IV infiltration of contrast in CT department. Radiology PA, Amy aware of infiltration according to ED and Stroke RN.

## 2022-01-29 ENCOUNTER — Encounter (HOSPITAL_COMMUNITY): Payer: Self-pay | Admitting: Radiology

## 2022-01-29 DIAGNOSIS — R1312 Dysphagia, oropharyngeal phase: Secondary | ICD-10-CM

## 2022-01-29 DIAGNOSIS — I513 Intracardiac thrombosis, not elsewhere classified: Secondary | ICD-10-CM

## 2022-01-29 DIAGNOSIS — J9600 Acute respiratory failure, unspecified whether with hypoxia or hypercapnia: Secondary | ICD-10-CM | POA: Diagnosis not present

## 2022-01-29 DIAGNOSIS — R0689 Other abnormalities of breathing: Secondary | ICD-10-CM

## 2022-01-29 DIAGNOSIS — I5022 Chronic systolic (congestive) heart failure: Secondary | ICD-10-CM

## 2022-01-29 DIAGNOSIS — I639 Cerebral infarction, unspecified: Secondary | ICD-10-CM | POA: Diagnosis not present

## 2022-01-29 DIAGNOSIS — E7800 Pure hypercholesterolemia, unspecified: Secondary | ICD-10-CM | POA: Diagnosis not present

## 2022-01-29 DIAGNOSIS — Z8673 Personal history of transient ischemic attack (TIA), and cerebral infarction without residual deficits: Secondary | ICD-10-CM

## 2022-01-29 LAB — CBC WITH DIFFERENTIAL/PLATELET
Abs Immature Granulocytes: 0.04 10*3/uL (ref 0.00–0.07)
Basophils Absolute: 0 10*3/uL (ref 0.0–0.1)
Basophils Relative: 1 %
Eosinophils Absolute: 0.1 10*3/uL (ref 0.0–0.5)
Eosinophils Relative: 1 %
HCT: 33.3 % — ABNORMAL LOW (ref 36.0–46.0)
Hemoglobin: 11.2 g/dL — ABNORMAL LOW (ref 12.0–15.0)
Immature Granulocytes: 1 %
Lymphocytes Relative: 20 %
Lymphs Abs: 1.7 10*3/uL (ref 0.7–4.0)
MCH: 29.5 pg (ref 26.0–34.0)
MCHC: 33.6 g/dL (ref 30.0–36.0)
MCV: 87.6 fL (ref 80.0–100.0)
Monocytes Absolute: 0.6 10*3/uL (ref 0.1–1.0)
Monocytes Relative: 7 %
Neutro Abs: 6.1 10*3/uL (ref 1.7–7.7)
Neutrophils Relative %: 70 %
Platelets: 173 10*3/uL (ref 150–400)
RBC: 3.8 MIL/uL — ABNORMAL LOW (ref 3.87–5.11)
RDW: 12.1 % (ref 11.5–15.5)
WBC: 8.6 10*3/uL (ref 4.0–10.5)
nRBC: 0 % (ref 0.0–0.2)

## 2022-01-29 LAB — BASIC METABOLIC PANEL
Anion gap: 8 (ref 5–15)
BUN: 6 mg/dL — ABNORMAL LOW (ref 8–23)
CO2: 20 mmol/L — ABNORMAL LOW (ref 22–32)
Calcium: 8.8 mg/dL — ABNORMAL LOW (ref 8.9–10.3)
Chloride: 110 mmol/L (ref 98–111)
Creatinine, Ser: 0.56 mg/dL (ref 0.44–1.00)
GFR, Estimated: >60 mL/min (ref >60)
Glucose, Bld: 148 mg/dL — ABNORMAL HIGH (ref 70–99)
Potassium: 3.5 mmol/L (ref 3.5–5.1)
Sodium: 138 mmol/L (ref 135–145)

## 2022-01-29 LAB — APTT
aPTT: 41 seconds — ABNORMAL HIGH (ref 24–36)
aPTT: 39 seconds — ABNORMAL HIGH (ref 24–36)
aPTT: 58 seconds — ABNORMAL HIGH (ref 24–36)

## 2022-01-29 LAB — TROPONIN I (HIGH SENSITIVITY): Troponin I (High Sensitivity): 41 ng/L — ABNORMAL HIGH (ref <18)

## 2022-01-29 LAB — TRIGLYCERIDES: Triglycerides: 271 mg/dL — ABNORMAL HIGH (ref <150)

## 2022-01-29 LAB — HEPARIN LEVEL (UNFRACTIONATED): Heparin Unfractionated: 0.66 IU/mL (ref 0.30–0.70)

## 2022-01-29 MED ORDER — ALPRAZOLAM 0.5 MG PO TABS: 0.5000 mg | ORAL_TABLET | Freq: Every day | ORAL | Status: DC

## 2022-01-29 MED ORDER — SACUBITRIL-VALSARTAN 24-26 MG PO TABS
1.0000 | ORAL_TABLET | Freq: Two times a day (BID) | ORAL | Status: DC
  Filled 2022-01-29 (×4): qty 1

## 2022-01-29 MED ORDER — MIRABEGRON ER 25 MG PO TB24
25.0000 mg | ORAL_TABLET | Freq: Every day | ORAL | Status: DC
  Filled 2022-01-29: qty 1

## 2022-01-29 MED ORDER — LORATADINE 10 MG PO TABS: 10.0000 mg | ORAL_TABLET | ORAL | Status: DC

## 2022-01-29 MED ORDER — PREGABALIN 25 MG PO CAPS: 25.0000 mg | ORAL_CAPSULE | Freq: Two times a day (BID) | ORAL | Status: DC

## 2022-01-29 MED ORDER — LORATADINE 10 MG PO TABS
10.0000 mg | ORAL_TABLET | Freq: Every day | ORAL | Status: DC
Start: 2022-01-29 — End: 2022-01-30

## 2022-01-29 MED ORDER — ALPRAZOLAM 0.5 MG PO TABS
0.5000 mg | ORAL_TABLET | Freq: Every day | ORAL | Status: DC
Start: 2022-01-29 — End: 2022-01-30

## 2022-01-29 MED ORDER — POTASSIUM CHLORIDE 20 MEQ PO PACK: 40.0000 meq | PACK | Freq: Once | ORAL | Status: DC

## 2022-01-29 MED ORDER — SODIUM CHLORIDE 0.9 % IV SOLN: INTRAVENOUS | Status: DC

## 2022-01-29 MED ORDER — SACUBITRIL-VALSARTAN 24-26 MG PO TABS
1.0000 | ORAL_TABLET | Freq: Two times a day (BID) | ORAL | Status: DC
  Filled 2022-01-29: qty 1

## 2022-01-29 MED ORDER — FENTANYL CITRATE PF 50 MCG/ML IJ SOSY
50.0000 ug | PREFILLED_SYRINGE | INTRAMUSCULAR | Status: DC | PRN
  Administered 2022-02-01 – 2022-02-02 (×3): 50 ug via INTRAVENOUS
  Filled 2022-01-29 (×3): qty 1

## 2022-01-29 NOTE — Evaluation (Signed)
Physical Therapy Evaluation Patient Details Name: Chloe Johnson MRN: [id number] DOB: [birth year] Today's Date: 01/29/2022  History of Present Illness  Pt is a 66 y.o. female who presented 01/28/22 with weakness and aphasia. S/p complete revascularization of occluded superior division of the left middle cerebral artery M2 segment with 1 pass with a 3 mm x 40 mm treatment retrieval device and proximal aspiration achieving aTICI 2C revascularization 9/5. ETT 9/5-9/6. MRI revealed new acute infarcts in the left posterior frontal and anterior parietal cortex. Of note, pt admitted 01/21/22-01/23/22 for small acute infarct in the left frontal [place] matter. PMH: HTN, cervical radiculopathy, hepatitis, osteomyelitis/discitis, lumbar stenosis with foot drop anxiety, GERD, insomnia   Clinical Impression  Pt presents with condition above and deficits mentioned below, see PT Problem List. This pt is familiar to this PT form her recent admission 8/29-8/31 for an acute CVA. Upon recent d/c, pt was IND without DME, living with her spouse in a multi-level house with 5 STE. Currently, pt is primarily limited by expressive communication deficits and R UE weakness. Her bil lower extremities appear to be intact/WFL and symmetrical in strength and sensation. She displays some mild balance deficits, but quickly progressed from minA x1 HHA to no min guard assist without UE support to ambulate this session. I anticipate her balance and mobility will continue to progress quickly, thus no PT follow-up needed as pt would more likely benefit more from OT to address her R UE deficits. Will continue to follow acutely to maximize her return to baseline prior to d/c.        Recommendations for follow up therapy are one component of a multi-disciplinary discharge planning process, led by the attending physician.  Recommendations may be updated based on patient status, additional functional criteria and insurance authorization.  Follow Up  Recommendations No PT follow up      Assistance Recommended at Discharge Intermittent Supervision/Assistance  Patient can return home with the following  Assist for transportation;Direct supervision/assist for medications management;Direct supervision/assist for financial management;A little help with walking and/or transfers;Help with stairs or ramp for entrance;A little help with bathing/dressing/bathroom;Assistance with cooking/housework    Equipment Recommendations None recommended by PT  Recommendations for Other Services       Functional Status Assessment Patient has had a recent decline in their functional status and demonstrates the ability to make significant improvements in function in a reasonable and predictable amount of time.     Precautions / Restrictions Precautions Precautions: Fall;Other (comment) (low risk) Precaution Comments: SBP goal < 160 Restrictions Weight Bearing Restrictions: No      Mobility  Bed Mobility Overal bed mobility: Modified Independent Bed Mobility: Supine to Sit     Supine to sit: Modified independent (Device/Increase time), HOB elevated     General bed mobility comments: HOB elevated, no assistance needed    Transfers Overall transfer level: Needs assistance Equipment used: 1 person hand held assist Transfers: Sit to/from Stand Sit to Stand: +2 safety/equipment, Min assist           General transfer comment: Pt able to come to stand without LOB, but pt reaching out for light UE support to ensure stability.    Ambulation/Gait Ambulation/Gait assistance: Min guard, Min assist, +2 safety/equipment Gait Distance (Feet): 200 Feet Assistive device: None, 1 person hand held assist Gait Pattern/deviations: Step-through pattern, Decreased stride length Gait velocity: reduced Gait velocity interpretation: <1.8 ft/sec, indicate of risk for recurrent falls   General Gait Details: Pt with slow, but mostly  steady gait. Pt initially  desiring to hold onto therapist with minA for support, but able to progress to no UE support with min guard assist for safety and even look L <> R and up <> down without LOB.  Stairs            Wheelchair Mobility    Modified Rankin (Stroke Patients Only) Modified Rankin (Stroke Patients Only) Pre-Morbid Rankin Score: No symptoms Modified Rankin: Moderate disability     Balance Overall balance assessment: Needs assistance Sitting-balance support: No upper extremity supported, Feet supported Sitting balance-Leahy Scale: Good     Standing balance support: No upper extremity supported, During functional activity Standing balance-Leahy Scale: Good Standing balance comment: No LOB when challenging vestibular system while ambulating, but slow to ambulate with initial desire to hold onto something for support                             Pertinent Vitals/Pain Pain Assessment Pain Assessment: Faces Faces Pain Scale: No hurt Pain Intervention(s): Monitored during session    Home Living Family/patient expects to be discharged to:: Private residence Living Arrangements: Spouse/significant other Available Help at Discharge: Available 24 hours/day Type of Home: House Home Access: Stairs to enter Entrance Stairs-Rails: Left Entrance Stairs-Number of Steps: 5 from garage, 3+2 at porch Alternate Level Stairs-Number of Steps: flight Home Layout: Multi-level;Bed/bath upstairs Home Equipment: Agricultural consultant (2 wheels);Rollator (4 wheels);Cane - single point;Shower seat Additional Comments: Husband works, able to take time off initially if needed    Prior Function Prior Level of Function : Independent/Modified Independent             Mobility Comments: no AD ADLs Comments: works, drives     Higher education careers adviser   Dominant Hand: Right    Extremity/Trunk Assessment   Upper Extremity Assessment Upper Extremity Assessment: Defer to OT evaluation    Lower Extremity  Assessment Lower Extremity Assessment: Overall WFL for tasks assessed (MMT scores of 5 grossly bil; denied numbness/tingling bil; coordination intact bil)    Cervical / Trunk Assessment Cervical / Trunk Assessment: Kyphotic  Communication   Communication: Expressive difficulties  Cognition Arousal/Alertness: Awake/alert Behavior During Therapy:  (appears upset and frustrated) Overall Cognitive Status: Difficult to assess                                 General Comments: Pt with expressive communication deficits, limiting cognitive assessment some. Pt follows commands appropriately, but does tend to nod "yes" to questions quickly and then has to stop and will change her mind to shake her head "no" intermittently.        General Comments General comments (skin integrity, edema, etc.): VSS on RA; family present    Exercises     Assessment/Plan    PT Assessment Patient needs continued PT services  PT Problem List Decreased strength;Decreased activity tolerance;Decreased balance;Decreased mobility       PT Treatment Interventions DME instruction;Gait training;Stair training;Functional mobility training;Therapeutic activities;Therapeutic exercise;Neuromuscular re-education;Balance training;Patient/family education;Cognitive remediation    PT Goals (Current goals can be found in the Care Plan section)  Acute Rehab PT Goals Patient Stated Goal: to improve PT Goal Formulation: With patient/family Time For Goal Achievement: 02/12/22 Potential to Achieve Goals: Good    Frequency Min 3X/week     Co-evaluation PT/OT/SLP Co-Evaluation/Treatment: Yes Reason for Co-Treatment: For patient/therapist safety;To address functional/ADL transfers PT goals addressed during session:  Mobility/safety with mobility;Balance         AM-PAC PT "6 Clicks" Mobility  Outcome Measure Help needed turning from your back to your side while in a flat bed without using bedrails?:  None Help needed moving from lying on your back to sitting on the side of a flat bed without using bedrails?: None Help needed moving to and from a bed to a chair (including a wheelchair)?: A Little Help needed standing up from a chair using your arms (e.g., wheelchair or bedside chair)?: A Little Help needed to walk in hospital room?: A Little Help needed climbing 3-5 steps with a railing? : A Little 6 Click Score: 20    End of Session Equipment Utilized During Treatment: Gait belt Activity Tolerance: Patient tolerated treatment well Patient left: in chair;with call bell/phone within reach;with chair alarm set Nurse Communication: Mobility status PT Visit Diagnosis: Other symptoms and signs involving the nervous system (R29.898);Unsteadiness on feet (R26.81);Other abnormalities of gait and mobility (R26.89);Difficulty in walking, not elsewhere classified (R26.2);Muscle weakness (generalized) (M62.81)    Time: 1610-9604 PT Time Calculation (min) (ACUTE ONLY): 31 min   Charges:   PT Evaluation $PT Eval Moderate Complexity: 1 Mod          Raymond Gurney, PT, DPT Acute Rehabilitation Services  Office: [phone]   [name] 01/29/2022, 2:33 PM

## 2022-01-29 NOTE — Evaluation (Signed)
Occupational Therapy Evaluation Patient Details Name: Chloe Johnson MRN: [id number] DOB: 1956-05-09 Today's Date: 01/29/2022   History of Present Illness Pt is a 66 y.o. female who presented 01/28/22 with weakness and aphasia. S/p complete revascularization of occluded superior division of the left middle cerebral artery M2 segment with 1 pass with a 3 mm x 40 mm treatment retrieval device and proximal aspiration achieving aTICI 2C revascularization 9/5. ETT 9/5-9/6. MRI revealed new acute infarcts in the left posterior frontal and anterior parietal cortex. Of note, pt admitted 01/21/22-01/23/22 for small acute infarct in the left frontal Chloe Johnson matter. PMH: HTN, cervical radiculopathy, hepatitis, osteomyelitis/discitis, lumbar stenosis with foot drop anxiety, GERD, insomnia   Clinical Impression   Chloe Johnson was evaluated s/p the above admission list, therapist familiar with pt from recent admission 8/29-8/31. Pt was indep without DME since her last CVA. Upon evaluation, pt was limited by expressive aphasia and RUE flaccidity (RHD) with paraesthesias. Overall she was min G for mobility and needs up to min A for ADLs due to impairments listed below. She will benefit from continued OT acutely, recommend d/c to AIR for maximal functional progress towards indep baseline.      Recommendations for follow up therapy are one component of a multi-disciplinary discharge planning process, led by the attending physician.  Recommendations may be updated based on patient status, additional functional criteria and insurance authorization.   Follow Up Recommendations  Acute inpatient rehab (3hours/day)    Assistance Recommended at Discharge Frequent or constant Supervision/Assistance  Patient can return home with the following A little help with walking and/or transfers;A little help with bathing/dressing/bathroom;Assistance with cooking/housework;Direct supervision/assist for medications management;Direct supervision/assist  for financial management;Assist for transportation;Help with stairs or ramp for entrance    Functional Status Assessment  Patient has had a recent decline in their functional status and demonstrates the ability to make significant improvements in function in a reasonable and predictable amount of time.  Equipment Recommendations  Tub/shower seat    Recommendations for Other Services Rehab consult     Precautions / Restrictions Precautions Precautions: Fall;Other (comment) Precaution Comments: SBP goal < 160 Restrictions Weight Bearing Restrictions: No      Mobility Bed Mobility Overal bed mobility: Needs Assistance Bed Mobility: Supine to Sit     Supine to sit: Supervision     General bed mobility comments: incr time and close supervision    Transfers Overall transfer level: Needs assistance Equipment used: None Transfers: Sit to/from Stand Sit to Stand: Min guard           General transfer comment: close min G      Balance Overall balance assessment: Needs assistance Sitting-balance support: Feet supported Sitting balance-Chloe Johnson Scale: Fair     Standing balance support: No upper extremity supported, During functional activity Standing balance-Chloe Johnson Scale: Fair                             ADL either performed or assessed with clinical judgement   ADL Overall ADL's : Needs assistance/impaired Eating/Feeding: NPO   Grooming: Minimal assistance;Sitting   Upper Body Bathing: Minimal assistance;Sitting   Lower Body Bathing: Minimal assistance;Sit to/from stand   Upper Body Dressing : Minimal assistance;Sitting   Lower Body Dressing: Minimal assistance;Sit to/from stand   Toilet Transfer: Min guard;Ambulation   Toileting- Clothing Manipulation and Hygiene: Supervision/safety;Sitting/lateral lean       Functional mobility during ADLs: Min guard General ADL Comments: assist due to flaccid  RUE and aphasic     Vision Baseline  Vision/History: 1 Wears glasses Vision Assessment?: No apparent visual deficits Additional Comments: needs further assessment. pt wears glasses, did not have them acutely     Perception     Praxis      Pertinent Vitals/Pain Pain Assessment Pain Assessment: Faces Pain Score: 0-No pain     Hand Dominance Right   Extremity/Trunk Assessment Upper Extremity Assessment Upper Extremity Assessment: RUE deficits/detail RUE Deficits / Details: flaccid, no activation noted. pt confirms parasthesias throughout entire RUE RUE Sensation: decreased light touch;decreased proprioception RUE Coordination: decreased fine motor;decreased gross motor   Lower Extremity Assessment Lower Extremity Assessment: Defer to PT evaluation   Cervical / Trunk Assessment Cervical / Trunk Assessment: Normal   Communication Communication Communication: Expressive difficulties (gave pt communication board, she is able to accurately spell otu words by pointing with LUE)   Cognition Arousal/Alertness: Awake/alert Behavior During Therapy: WFL for tasks assessed/performed Overall Cognitive Status: Difficult to assess                                 General Comments: Overall WFL. Pt appears frustrated/update due to her current situation. She nodded her head yes/no accurately throughout with cues to slow down.     General Comments  VSS on RA, family present and supportive    Exercises     Shoulder Instructions      Home Living Family/patient expects to be discharged to:: Private residence Living Arrangements: Spouse/significant other Available Help at Discharge: Available 24 hours/day Type of Home: House Home Access: Stairs to enter [employer] of Steps: 5 from garage, 3+2 at [employer]: Left Home Layout: Multi-level;Bed/bath upstairs Alternate Level Stairs-Number of Steps: flight Alternate Level Stairs-Rails: Right Bathroom Shower/Tub: Chartered loss adjuster Toilet: Handicapped height     Home Equipment: Agricultural consultant (2 wheels);Rollator (4 wheels);Cane - single point;Shower seat   Additional Comments: Husband works, able to take time off initially if needed  Lives With: Family    Prior Functioning/Environment Prior Level of Function : Independent/Modified Independent             Mobility Comments: no AD ADLs Comments: works, drives        OT Problem List: Decreased cognition;Decreased range of motion;Decreased strength;Decreased activity tolerance;Impaired balance (sitting and/or standing);Decreased coordination;Decreased safety awareness;Decreased knowledge of precautions;Impaired UE functional use;Pain      OT Treatment/Interventions: Self-care/ADL training;Therapeutic exercise;DME and/or AE instruction;Therapeutic activities;Patient/family education;Balance training    OT Goals(Current goals can be found in the care plan section) Acute Rehab OT Goals Patient Stated Goal: unable to state OT Goal Formulation: With patient Time For Goal Achievement: 02/12/22 Potential to Achieve Goals: Good ADL Goals Pt Will Perform Grooming: with modified independence;standing Pt Will Perform Upper Body Dressing: with modified independence;sitting Pt Will Perform Lower Body Dressing: with modified independence;sit to/from stand Additional ADL Goal #1: pt will use RUE as a functional assist with min A to comoplete grooming task  OT Frequency: Min 2X/week    Co-evaluation PT/OT/SLP Co-Evaluation/Treatment: Yes Reason for Co-Treatment: Complexity of the patient's impairments (multi-system involvement);For patient/therapist safety;To address functional/ADL transfers PT goals addressed during session: Mobility/safety with mobility;Balance OT goals addressed during session: ADL's and self-care      AM-PAC OT "6 Clicks" Daily Activity     Outcome Measure Help from another person eating meals?: A Little Help from another person  taking care of personal grooming?:  A Little Help from another person toileting, which includes using toliet, bedpan, or urinal?: A Little Help from another person bathing (including washing, rinsing, drying)?: A Little Help from another person to put on and taking off regular upper body clothing?: A Little Help from another person to put on and taking off regular lower body clothing?: A Little 6 Click Score: 18   End of Session Equipment Utilized During Treatment: Gait belt Nurse Communication: Mobility status  Activity Tolerance: Patient tolerated treatment well Patient left: in chair;with call bell/phone within reach;with chair alarm set  OT Visit Diagnosis: Unsteadiness on feet (R26.81);Cognitive communication deficit (R41.841);Other abnormalities of gait and mobility (R26.89);Hemiplegia and hemiparesis Symptoms and signs involving cognitive functions: Cerebral infarction Hemiplegia - Right/Left: Right Hemiplegia - dominant/non-dominant: Dominant Hemiplegia - caused by: Cerebral infarction                Time: 1115-1140 OT Time Calculation (min): 25 min Charges:  OT General Charges $OT Visit: 1 Visit OT Evaluation $OT Eval Moderate Complexity: 1 Mod    Ryanna Teschner A Waldo Damian 01/29/2022, 2:45 PM

## 2022-01-29 NOTE — Procedures (Signed)
Extubation Procedure Note  Patient Details:   Name: BILLI BRIGHT DOB: 1956/04/24 MRN: [id number]   Airway Documentation:  Airway 7 mm (Active)  Secured at (cm) 21 cm 01/29/22 0802  Measured From Lips 01/29/22 0802  Secured Location Right 01/29/22 0802  Secured By [employer] 01/29/22 0802  Tube Holder Repositioned Yes 01/29/22 0802  Prone position No 01/29/22 0203  Cuff Pressure (cm H2O) Green OR 18-26 CmH2O 01/29/22 0802  Site Condition Dry 01/29/22 0802   Vent end date: (not recorded) Vent end time: (not recorded)   Evaluation  O2 sats: stable throughout Complications: No apparent complications Patient did tolerate procedure well. Bilateral Breath Sounds: Clear   Yes  Patient was extubated to a 4L [place] without any complications, dyspnea or stridor noted. Positive cuff leak prior to extubation.   Carlynn Spry 01/29/2022, 8:53 AM

## 2022-01-29 NOTE — Progress Notes (Signed)
MRI brain images from the follow up scan performed at 2243 have been reviewed. Report is pending. A few punctate foci of acute restricted diffusion are seen in the left MCA distribution that are new relative to the prior MRI performed 01/22/22. No acute hemorrhage seen. I agree with the official report conclusions as follows:    A/R: 66 year old female who is s/p complete revascularization of occluded superior division of the left middle cerebral artery M2 segment with 1 pass with a 3 mm x 40 mm treatment retrieval device and proximal aspiration achieving aTICI 2C revascularization. - MRI brain obtained after revascularization reveals punctate foci of restricted diffusion in the left MCA territory, most likely periprocedural or due to small embolic strokes occurring before the procedure.  - No change to current management plan - Stroke team to follow in the AM.   Electronically signed: Dr. Caryl Pina

## 2022-01-29 NOTE — Progress Notes (Signed)
SLP Cancellation Note  Patient Details Name: Chloe Johnson MRN: [id number] DOB: 07-16-1955   Cancelled treatment:       Reason Eval/Treat Not Completed: Other (comment) (Patient unable to perform oral motor movements or initiate a swallow (see Speech-language evaluation from this AM). SLP checked in with patient's RN in PM but she reported little change in patient's function. SLP will f/u next date but recommending short term non-oral nutrition (Cortrak) due to severity of patient's deficits.   Angela Nevin, MA, CCC-SLP Speech Therapy

## 2022-01-29 NOTE — TOC CAGE-AID Note (Signed)
Transition of Care [facility]) - CAGE-AID Screening   Patient Details  Name: Chloe Johnson MRN: [id number] Date of Birth: [birth year]  [facility]) CM/SW Contact:    Mearl Latin, LCSW Phone Number: 01/29/2022, 5:38 PM   Clinical Narrative: Patient disoriented and unable to participate in screening at this time. Of note, patient was screened last admission 6 days ago and denied ETOH/SA use.    CAGE-AID Screening: Substance Abuse Screening unable to be completed due to: : Patient unable to participate

## 2022-01-29 NOTE — Progress Notes (Signed)
ANTICOAGULATION CONSULT NOTE  Pharmacy Consult for heparin Indication: LV thrombus  Allergies  Allergen Reactions   Doxycycline Nausea Only    malaise    Erythromycin Nausea And Vomiting   Iodine     unknown   Latex Other (See Comments)    Irritation of the skin   Shellfish Allergy Other (See Comments)    GI- Facial  Acne   Strawberry Extract Other (See Comments)    Mouth ulcers    Vancomycin    Vicodin [Hydrocodone-Acetaminophen] Other (See Comments)    Restless, tolerate percocet   Dilaudid [Hydromorphone Hcl] Rash    *Pt states she can take if given benadryl prior*   Keflex [Cephalexin] Rash   Penicillins Rash    Hives   Sulfa Antibiotics Rash    Hives    Patient Measurements: Height: 5\' 1"  (154.9 cm) Weight: 59 kg (130 lb 1.1 oz) IBW/kg (Calculated) : 47.8 Heparin Dosing Weight: 60.9kg  Vital Signs: Temp: 98.3 F (36.8 C) (09/06 1500) Temp Source: Oral (09/06 1500) BP: 131/73 (09/06 1800) Pulse Rate: 85 (09/06 1800)  Labs: Recent Labs    01/28/22 1330 01/28/22 1337 01/28/22 1704 01/28/22 1747 01/29/22 0300 01/29/22 0845 01/29/22 1025 01/29/22 1745  HGB 12.0 11.9*  --  10.2* 11.2*  --   --   --   HCT 35.5* 35.0*  --  30.0* 33.3*  --   --   --   PLT 171  --   --   --  173  --   --   --   APTT 24  --   --   --  41*  --  39* 58*  LABPROT 15.0  --   --   --   --   --   --   --   INR 1.2  --   --   --   --   --   --   --   HEPARINUNFRC  --   --   --   --   --   --   --  0.66  CREATININE 0.84 0.70  --   --   --  0.56  --   --   TROPONINIHS  --   --  69*  --  41*  --   --   --      Estimated Creatinine Clearance: 58.7 mL/min (by C-G formula based on SCr of 0.56 mg/dL).   Medical History: 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/16/2013   L3-4/notes 11/24/2013, arms, neck   Exertional asthma    GERD (gastroesophageal reflux disease)    Hypertension    Kidney stones    "have always passed  them"   Neck pain    Osteomyelitis (HCC) 11/23/2013   osteomyelitis, discitis    Sleep concern    uses Prozac for sleep     Medications:  Medications Prior to Admission  Medication Sig Dispense Refill Last Dose   ALPRAZolam (XANAX) 0.5 MG tablet Take 0.5 mg by mouth at bedtime.   01/27/2022   apixaban (ELIQUIS) 5 MG TABS tablet Take 1 tablet (5 mg total) by mouth 2 (two) times daily. 60 tablet 1 01/28/2022 at 0800   cyclobenzaprine (FLEXERIL) 10 MG tablet Take 1 tablet (10 mg total) by mouth 3 (three) times daily as needed for muscle spasms. 90 tablet 5 01/27/2022   guaiFENesin (MUCINEX) 600 MG 12 hr tablet  Take 600 mg by mouth daily as needed for cough or to loosen phlegm.   Past Month   loratadine (CLARITIN) 10 MG tablet Take 10 mg by mouth every morning.   01/28/2022   MYRBETRIQ 25 MG TB24 tablet Take 25 mg by mouth daily.   01/28/2022   pregabalin (LYRICA) 25 MG capsule Take 1 capsule (25 mg total) by mouth 2 (two) times daily. 60 capsule 0 01/28/2022   rosuvastatin (CRESTOR) 20 MG tablet Take 1 tablet (20 mg total) by mouth daily. 30 tablet 1 01/28/2022   sacubitril-valsartan (ENTRESTO) 24-26 MG Take 1 tablet by mouth 2 (two) times daily. 60 tablet 1 01/28/2022   Scheduled:   ALPRAZolam  0.5 mg Per Tube QHS   Chlorhexidine Gluconate Cloth  6 each Topical Daily   docusate  100 mg Per Tube BID   loratadine  10 mg Per Tube Daily   mouth rinse  15 mL Mouth Rinse Q2H   pantoprazole  40 mg Per Tube Daily   polyethylene glycol  17 g Per Tube Daily   potassium chloride  40 mEq Per Tube Once   pregabalin  25 mg Per Tube BID   rosuvastatin  20 mg Per Tube Daily   sacubitril-valsartan  1 tablet Per Tube BID   sodium chloride flush  10-40 mL Intracatheter Q12H    Assessment: 42 yoF with admitted for stroke s/p thrombectomy on 9/5. 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 Pharmacy consulted to dose heparin. Last Eliquis dose 9/5 @0800 .  Was previously following post-neuro IR protocol with low aPTT goals  without bolus. Verified with neurology okay to increase to normal aPTT/heparin level goals of 0.3-0.7 but continue without boluses.  Will continue to monitor aPTT levels until heparin levels correlate given recent Eliquis administration.  aPTT 58 while on heparin 800 units/hr. HL still not correlating with aPTT. No issues with infusion and no signs/symptoms of bleeding reported.  Goal of Therapy:  Heparin level 0.3-0.7 units/ml without boluses aPTT 66-102 seconds Monitor platelets by anticoagulation protocol: Yes   Plan:  Increase heparin to 900 units/hr Check daily HL/aPTT Monitor CBC and s/sx bleeding   , PharmD, [facility] Clinical Pharmacist Please see AMION for all Pharmacists' Contact Phone Numbers 01/29/2022, 7:03 PM

## 2022-01-29 NOTE — Progress Notes (Signed)
PT Cancellation Note  Patient Details Name: Chloe Johnson MRN: [id number] DOB: 1956/01/18   Cancelled Treatment:    Reason Eval/Treat Not Completed: Active bedrest order. Will plan to follow-up later as time permits and as activity orders are updated.   Raymond Gurney, PT, DPT Acute Rehabilitation Services  Office: [phone]    [name] 01/29/2022, 8:14 AM

## 2022-01-29 NOTE — Anesthesia Postprocedure Evaluation (Signed)
Anesthesia Post Note  Patient: Chloe Johnson  Procedure(s) Performed: IR WITH ANESTHESIA     Patient location during evaluation: SICU Anesthesia Type: General Level of consciousness: sedated Pain management: pain level controlled Vital Signs Assessment: post-procedure vital signs reviewed and stable Respiratory status: patient remains intubated per anesthesia plan Cardiovascular status: stable Postop Assessment: no apparent nausea or vomiting Anesthetic complications: no   No notable events documented.  Last Vitals:  Vitals:   01/29/22 1030 01/29/22 1045  BP: 133/66 127/64  Pulse: 88 88  Resp: 15 18  Temp:    SpO2: 100% 100%    Last Pain:  Vitals:   01/29/22 0750  TempSrc: Axillary                 Chloe Johnson

## 2022-01-29 NOTE — Progress Notes (Signed)
ANTICOAGULATION CONSULT NOTE  Pharmacy Consult for heparin Indication: LV thrombus  Allergies  Allergen Reactions   Doxycycline Nausea Only    malaise    Erythromycin Nausea And Vomiting   Iodine     unknown   Latex Other (See Comments)    Irritation of the skin   Shellfish Allergy Other (See Comments)    GI- Facial  Acne   Strawberry Extract Other (See Comments)    Mouth ulcers    Vancomycin    Vicodin [Hydrocodone-Acetaminophen] Other (See Comments)    Restless, tolerate percocet   Dilaudid [Hydromorphone Hcl] Rash    *Pt states she can take if given benadryl prior*   Keflex [Cephalexin] Rash   Penicillins Rash    Hives   Sulfa Antibiotics Rash    Hives    Patient Measurements: Height: 5\' 1"  (154.9 cm) Weight: 59 kg (130 lb 1.1 oz) IBW/kg (Calculated) : 47.8 Heparin Dosing Weight: 60.9kg  Vital Signs: Temp: 97.9 F (36.6 C) (09/06 1123) Temp Source: Oral (09/06 1123) BP: 120/78 (09/06 1123) Pulse Rate: 98 (09/06 1123)  Labs: Recent Labs    01/26/22 1557 01/26/22 1557 01/28/22 1330 01/28/22 1337 01/28/22 1704 01/28/22 1747 01/29/22 0300 01/29/22 0845 01/29/22 1025  HGB  --    < > 12.0 11.9*  --  10.2* 11.2*  --   --   HCT  --    < > 35.5* 35.0*  --  30.0* 33.3*  --   --   PLT  --   --  171  --   --   --  173  --   --   APTT  --   --  24  --   --   --  41*  --  39*  LABPROT  --   --  15.0  --   --   --   --   --   --   INR  --   --  1.2  --   --   --   --   --   --   CREATININE 0.61  --  0.84 0.70  --   --   --  0.56  --   TROPONINIHS 28*  --   --   --  69*  --  41*  --   --    < > = values in this interval not displayed.     Estimated Creatinine Clearance: 58.7 mL/min (by C-G formula based on SCr of 0.56 mg/dL).   Medical History: 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/16/2013   L3-4/notes 11/24/2013, arms, neck   Exertional asthma    GERD (gastroesophageal reflux disease)     Hypertension    Kidney stones    "have always passed them"   Neck pain    Osteomyelitis (HCC) 11/23/2013   osteomyelitis, discitis    Sleep concern    uses Prozac for sleep     Medications:  Medications Prior to Admission  Medication Sig Dispense Refill Last Dose   ALPRAZolam (XANAX) 0.5 MG tablet Take 0.5 mg by mouth at bedtime.   01/27/2022   apixaban (ELIQUIS) 5 MG TABS tablet Take 1 tablet (5 mg total) by mouth 2 (two) times daily. 60 tablet 1 01/28/2022 at 0800   cyclobenzaprine (FLEXERIL) 10 MG tablet Take 1 tablet (10 mg total) by mouth 3 (three) times daily as needed for muscle spasms.  90 tablet 5 01/27/2022   guaiFENesin (MUCINEX) 600 MG 12 hr tablet Take 600 mg by mouth daily as needed for cough or to loosen phlegm.   Past Month   loratadine (CLARITIN) 10 MG tablet Take 10 mg by mouth every morning.   01/28/2022   MYRBETRIQ 25 MG TB24 tablet Take 25 mg by mouth daily.   01/28/2022   pregabalin (LYRICA) 25 MG capsule Take 1 capsule (25 mg total) by mouth 2 (two) times daily. 60 capsule 0 01/28/2022   rosuvastatin (CRESTOR) 20 MG tablet Take 1 tablet (20 mg total) by mouth daily. 30 tablet 1 01/28/2022   sacubitril-valsartan (ENTRESTO) 24-26 MG Take 1 tablet by mouth 2 (two) times daily. 60 tablet 1 01/28/2022   Scheduled:   ALPRAZolam  0.5 mg Per Tube QHS   Chlorhexidine Gluconate Cloth  6 each Topical Daily   docusate  100 mg Per Tube BID   loratadine  10 mg Per Tube Daily   mouth rinse  15 mL Mouth Rinse Q2H   pantoprazole  40 mg Per Tube Daily   polyethylene glycol  17 g Per Tube Daily   potassium chloride  40 mEq Per Tube Once   pregabalin  25 mg Per Tube BID   rosuvastatin  20 mg Per Tube Daily   sacubitril-valsartan  1 tablet Per Tube BID   sodium chloride flush  10-40 mL Intracatheter Q12H    Assessment: 89 yoF with admitted for stroke s/p thrombectomy on 9/5. 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 Pharmacy consulted to dose heparin. Last Eliquis dose 9/5 @0800 .  Was previously  following post-neuro IR protocol with low aPTT goals without bolus. Verified with neurology okay to increase to normal aPTT/heparin level goals of 0.3-0.7 but continue without boluses.  Will continue to monitor aPTT levels until heparin levels correlate given recent Eliquis administration.  aPTT this morning 39 while on heparin 600 units/hr. No issues with infusion and no signs/symptoms of bleeding reported.  Goal of Therapy:  Heparin level 0.3-0.7 units/ml without boluses aPTT 66-102 seconds Monitor platelets by anticoagulation protocol: Yes   Plan:  Increase heparin to 800 units/hr Check 6hr aPTT/heparin level at 1800 Monitor CBC, daily HL/aPTT, s/sx bleeding   , PharmD Clinical Pharmacist 01/29/2022 11:30 AM

## 2022-01-29 NOTE — Progress Notes (Signed)
Referring Physician(s): Bhagat,S  Supervising Physician: Julieanne Cotton  Patient Status:  [facility] - In-pt  Chief Complaint:  Left MCA stroke, right upper extremity weakness  Subjective: Pt remains intubated but awake/FC; family at bedside; able to move LUE/LLE/RLE; no sig movement of RUE   Allergies: Doxycycline, Erythromycin, Iodine, Latex, Shellfish allergy, Strawberry extract, Vancomycin, Vicodin [hydrocodone-acetaminophen], Dilaudid [hydromorphone hcl], Keflex [cephalexin], Penicillins, and Sulfa antibiotics  Medications: Prior to Admission medications   Medication Sig Start Date End Date Taking? Authorizing Provider  ALPRAZolam Prudy Feeler) 0.5 MG tablet Take 0.5 mg by mouth at bedtime. 12/23/21  Yes [provider]  apixaban (ELIQUIS) 5 MG TABS tablet Take 1 tablet (5 mg total) by mouth 2 (two) times daily. 01/23/22  Yes Karie Fetch, MD  cyclobenzaprine (FLEXERIL) 10 MG tablet Take 1 tablet (10 mg total) by mouth 3 (three) times daily as needed for muscle spasms. 11/17/14  Yes Tia Alert, MD  guaiFENesin (MUCINEX) 600 MG 12 hr tablet Take 600 mg by mouth daily as needed for cough or to loosen phlegm.   Yes [provider]  loratadine (CLARITIN) 10 MG tablet Take 10 mg by mouth every morning.   Yes [provider]  MYRBETRIQ 25 MG TB24 tablet Take 25 mg by mouth daily. 12/31/21  Yes [provider]  pregabalin (LYRICA) 25 MG capsule Take 1 capsule (25 mg total) by mouth 2 (two) times daily. 01/26/22 02/25/22 Yes Eber Hong, MD  rosuvastatin (CRESTOR) 20 MG tablet Take 1 tablet (20 mg total) by mouth daily. 01/24/22  Yes Karie Fetch, MD  sacubitril-valsartan (ENTRESTO) 24-26 MG Take 1 tablet by mouth 2 (two) times daily. 01/23/22  Yes Karie Fetch, MD     Vital Signs: BP 129/68   Pulse 99   Temp 98.3 F (36.8 C) (Axillary)   Resp 17   Ht 5\' 1"  (1.549 m)   Wt 130 lb 1.1 oz (59 kg)   SpO2 99%   BMI 24.58 kg/m   Physical  Exam intubated, awake; groin access sites soft, clean, dry, no sig hematomas; FC, able to move LUE/LLE/RLE, no sig movement of RUE; no gross facial asymm, EOMI, pupils equal  Imaging: MR BRAIN WO CONTRAST  Result Date: 01/28/2022 CLINICAL DATA:  Right MCA stroke status post thrombectomy, follow-up EXAM: MRI HEAD WITHOUT CONTRAST MRA HEAD WITHOUT CONTRAST TECHNIQUE: Multiplanar, multi-echo pulse sequences of the brain and surrounding structures were acquired without intravenous contrast. Angiographic images of the Circle of Willis were acquired using MRA technique without intravenous contrast. COMPARISON:  01/22/2022 MRI head, no prior MRA, correlation is made with CTA head and neck 01/21/2022 FINDINGS: Brain: New foci of restricted diffusion with ADC correlate in the left posterior frontal and anterior parietal cortex (series 5, images 80 8-95). Redemonstrated focus of restricted diffusion with ADC correlate in the left insula (series 5, image 76 and series 6, image 26). Other previously noted foci of restricted diffusion are decreased in conspicuity and do not have persistent ADC correlates. No acute hemorrhage, mass, mass effect, or midline shift. No hydrocephalus or extra-axial collection. Punctate focus of hemosiderin deposition in the left posterior frontal lobe. Minimal T2 hyperintense signal in the periventricular Gillie matter and pons, likely the sequela of mild chronic small vessel ischemic disease. Vascular: Normal arterial flow voids. Skull and upper cervical spine: Normal marrow signal. Sinuses/Orbits: Minimal mucosal thickening in the ethmoid air cells. The orbits are unremarkable. Other: The mastoids are well aerated. MRA HEAD FINDINGS Anterior circulation: Both internal carotid arteries  are patent to the termini, without significant stenosis. A1 segments patent. Normal anterior communicating artery. Anterior cerebral arteries are patent to their distal aspects. No M1 stenosis or occlusion. Distal  MCA branches perfused and symmetric.In particular, the left MCA branches appear perfused and without focal stenosis. Posterior circulation: Vertebral arteries patent to the vertebrobasilar junction without stenosis. Basilar patent to its distal aspect. Superior cerebellar arteries patent bilaterally. Patent P1 segments. PCAs perfused to their distal aspects without stenosis. Diminutive left posterior communicating artery. The right posterior communicating artery is not definitively visualized. Anatomic variants: None significant IMPRESSION: 1. 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 2. No intracranial large vessel occlusion or significant stenosis. In particular, left MCA branches appear perfused and without focal stenosis. These results will be called to the ordering clinician or representative by the Radiologist Assistant, and communication documented in the PACS or [employer]. Electronically Signed   By: Wiliam Ke M.D.   On: 01/28/2022 23:40   MR ANGIO HEAD WO CONTRAST  Result Date: 01/28/2022 CLINICAL DATA:  Right MCA stroke status post thrombectomy, follow-up EXAM: MRI HEAD WITHOUT CONTRAST MRA HEAD WITHOUT CONTRAST TECHNIQUE: Multiplanar, multi-echo pulse sequences of the brain and surrounding structures were acquired without intravenous contrast. Angiographic images of the Circle of Willis were acquired using MRA technique without intravenous contrast. COMPARISON:  01/22/2022 MRI head, no prior MRA, correlation is made with CTA head and neck 01/21/2022 FINDINGS: Brain: New foci of restricted diffusion with ADC correlate in the left posterior frontal and anterior parietal cortex (series 5, images 80 8-95). Redemonstrated focus of restricted diffusion with ADC correlate in the left insula (series 5, image 76 and series 6, image 26). Other previously noted foci of restricted diffusion are decreased in conspicuity and do not have persistent ADC correlates. No  acute hemorrhage, mass, mass effect, or midline shift. No hydrocephalus or extra-axial collection. Punctate focus of hemosiderin deposition in the left posterior frontal lobe. Minimal T2 hyperintense signal in the periventricular Kochan matter and pons, likely the sequela of mild chronic small vessel ischemic disease. Vascular: Normal arterial flow voids. Skull and upper cervical spine: Normal marrow signal. Sinuses/Orbits: Minimal mucosal thickening in the ethmoid air cells. The orbits are unremarkable. Other: The mastoids are well aerated. MRA HEAD FINDINGS Anterior circulation: Both internal carotid arteries are patent to the termini, without significant stenosis. A1 segments patent. Normal anterior communicating artery. Anterior cerebral arteries are patent to their distal aspects. No M1 stenosis or occlusion. Distal MCA branches perfused and symmetric.In particular, the left MCA branches appear perfused and without focal stenosis. Posterior circulation: Vertebral arteries patent to the vertebrobasilar junction without stenosis. Basilar patent to its distal aspect. Superior cerebellar arteries patent bilaterally. Patent P1 segments. PCAs perfused to their distal aspects without stenosis. Diminutive left posterior communicating artery. The right posterior communicating artery is not definitively visualized. Anatomic variants: None significant IMPRESSION: 1. 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 2. No intracranial large vessel occlusion or significant stenosis. In particular, left MCA branches appear perfused and without focal stenosis. These results will be called to the ordering clinician or representative by the Radiologist Assistant, and communication documented in the PACS or [employer]. Electronically Signed   By: Wiliam Ke M.D.   On: 01/28/2022 23:40   [place] EKG SITE RITE  Result Date: 01/28/2022 If Site Rite image not attached, placement could not be  confirmed due to current cardiac rhythm.  DG Abd 1 View  Result Date: 01/28/2022 CLINICAL DATA:  Evaluate OG tube placement EXAM: ABDOMEN - 1 VIEW COMPARISON:  None Available. FINDINGS: The OG tube terminates in the left upper quadrant, in the region of the stomach. IMPRESSION: The OG tube appears to terminate in the stomach. No other abnormalities. Electronically Signed   By: Gerome Sam III M.D.   On: 01/28/2022 17:28   Portable Chest x-ray  Result Date: 01/28/2022 CLINICAL DATA:  [id number]. Encounter for ETT placement. EXAM: PORTABLE CHEST 1 VIEW COMPARISON:  Chest x-ray 01/26/2022, CT heart 01/23/2022 FINDINGS: Enteric tube with tip terminating 3 cm above the carina. Slightly more prominent aortic arch and aortic knob likely due to positioning and AP portable technique. Otherwise the heart and mediastinal contours are unchanged normal limits. No focal consolidation. No pulmonary edema. No pleural effusion. No pneumothorax. No acute osseous abnormality. IMPRESSION: 1. Slightly more prominent aortic arch and aortic knob likely due to positioning and AP portable technique. Finding can likely be further evaluated on CT angio head and neck 01/28/2022. 2. No active disease. Electronically Signed   By: Tish Frederickson M.D.   On: 01/28/2022 17:21   CT HEAD CODE STROKE WO CONTRAST  Result Date: 01/28/2022 CLINICAL DATA:  Code stroke.  Neuro deficit, acute, stroke suspected EXAM: CT HEAD WITHOUT CONTRAST TECHNIQUE: Contiguous axial images were obtained from the base of the skull through the vertex without intravenous contrast. RADIATION DOSE REDUCTION: This exam was performed according to the departmental dose-optimization program which includes automated exposure control, adjustment of the mA and/or kV according to patient size and/or use of iterative reconstruction technique. COMPARISON:  CT head 01/21/2022. FINDINGS: Brain: No evidence of acute large vascular territory infarction, hemorrhage, hydrocephalus,  extra-axial collection or mass lesion/mass effect. Vascular: No hyperdense vessel identified. Skull: No acute fracture. Sinuses/Orbits: Clear sinuses.  No acute orbital findings. Other: No mastoid effusions. ASPECTS [facility] Stroke Program Early CT Score) total score (0-10 with 10 being normal): 10. IMPRESSION: 1. No evidence of acute intracranial abnormality. 2. ASPECTS is 10. Code stroke imaging results were communicated on 01/28/2022 at 1:43 pm to provider Bhagat via secure text paging. Electronically Signed   By: Feliberto Harts M.D.   On: 01/28/2022 13:43   DG Chest 2 View  Result Date: 01/26/2022 CLINICAL DATA:  Chest and shoulder pain.  Recent MRI and CVA. EXAM: CHEST - 2 VIEW COMPARISON:  11/21/2021 FINDINGS: The lungs are clear without focal pneumonia, edema, pneumothorax or pleural effusion. The cardiopericardial silhouette is within normal limits for size. The visualized bony structures of the thorax are unremarkable. IMPRESSION: No active cardiopulmonary disease. Electronically Signed   By: Kennith Center M.D.   On: 01/26/2022 12:03    Labs:  CBC: Recent Labs    01/22/22 0351 01/26/22 1118 01/28/22 1330 01/28/22 1337 01/28/22 1747 01/29/22 0300  WBC 4.9 5.4 4.7  --   --  8.6  HGB 12.0 13.8 12.0 11.9* 10.2* 11.2*  HCT 35.8* 44.1 35.5* 35.0* 30.0* 33.3*  PLT 187 170 171  --   --  173    COAGS: Recent Labs    01/21/22 1445 01/28/22 1330 01/29/22 0300  INR 1.1 1.2  --   APTT  --  24 41*    BMP: Recent Labs    01/21/22 1445 01/22/22 0827 01/26/22 1557 01/28/22 1330 01/28/22 1337 01/28/22 1747  NA 138 139 142 141 138 142  K 3.7 4.3 3.3* 3.8 4.0 3.7  CL 105 107 109 109 109  --   CO2 22  20* 22 23  --   --   GLUCOSE 94 113* 95 115* 110*  --   BUN 18 15 9 8 10   --   CALCIUM 10.0 8.6* 9.6 9.4  --   --   CREATININE 0.82 0.87 0.61 0.84 0.70  --   GFRNONAA >60 >60 >60 >60  --   --     LIVER FUNCTION TESTS: Recent Labs    01/21/22 1445 01/28/22 1330  BILITOT 1.0  0.3  AST 19 22  ALT 16 18  ALKPHOS 47 45  PROT 6.8 6.4*  ALBUMIN 4.1 3.9    Assessment and Plan:  66 year old with recent left MCA stroke, LV thrombus, hypertension, hyperlipidemia.  Initially admitted on 8/29 with left MCA stroke underwent mechanical thrombectomy and discharged on 8/31.  Readmitted 9/5 with repeat left MCA stroke / occluded superior division of the left middle cerebral artery in the M2 region. No occlusive thrombus in a parietal branch of the inferior division of the left middle cerebral artery in the distal M3 M4 region. Status post complete revascularization of the superior division of the left middle cerebral artery M2 segment with 1 pass with a 3 mm x 40 mm treatment retrieval device and proximal aspiration achieving aTICI 2C revascularization. MR brain/angio head 9/5:  1. 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 2. No intracranial large vessel occlusion or significant stenosis. In particular, left MCA branches appear perfused and without focal stenosis.  Afebrile, WBC nl, hgb 11.2, troponin 41(69); groin sites ok; currently on IV heparin; plans noted for possible extubation this am; additional plans as per neuro team; IP rehab  Electronically Signed: D. 11/5, PA-C 01/29/2022, 8:57 AM   I spent a total of 15 Minutes at the the patient's bedside AND on the patient's hospital floor or unit, greater than 50% of which was counseling/coordinating care for cerebral arteriogram with endovascular intervention    Patient ID: Chloe Johnson, female   DOB: January 09, 1956, 66 y.o.   MRN: [id number]

## 2022-01-29 NOTE — Progress Notes (Signed)
ANTICOAGULATION CONSULT NOTE - Follow Up Consult  Pharmacy Consult for heparin Indication:  LV thrombus and IR procedure  Labs: Recent Labs    01/26/22 1118 01/26/22 1557 01/28/22 1330 01/28/22 1337 01/28/22 1704 01/28/22 1747 01/29/22 0300  HGB 13.8  --  12.0 11.9*  --  10.2* 11.2*  HCT 44.1  --  35.5* 35.0*  --  30.0* 33.3*  PLT 170  --  171  --   --   --  173  APTT  --   --  24  --   --   --  41*  LABPROT  --   --  15.0  --   --   --   --   INR  --   --  1.2  --   --   --   --   CREATININE  --  0.61 0.84 0.70  --   --   --   TROPONINIHS 23* 28*  --   --  69*  --  41*    Assessment: 66yo female subtherapeutic on heparin with initial dosing while Eliquis on hold for IR procedure; no infusion issues or signs of bleeding per RN.  Goal of Therapy:  aPTT 47-61 seconds (consider increasing goal when appropriate s/p IR)   Plan:  Will increase heparin infusion by 1-2 units/kg/hr to 600 units/hr and check PTT in 6 hours.    Vernard Gambles, PharmD, BCPS  01/29/2022,4:26 AM

## 2022-01-29 NOTE — Evaluation (Signed)
Speech Language Pathology Evaluation Patient Details Name: Chloe Johnson MRN: [id number] DOB: Oct 20, 1955 Today's Date: 01/29/2022 Time: 3016-0109 SLP Time Calculation (min) (ACUTE ONLY): 20 min  Problem List:  Patient Active Problem List   Diagnosis Date Noted   Stroke determined by clinical assessment (HCC) 01/28/2022   Middle cerebral artery embolism, left 01/28/2022   H/O ischemic left MCA stroke    Acute respiratory failure (HCC)    CVA (cerebral vascular accident) (HCC) 01/21/2022   NSTEMI (non-ST elevated myocardial infarction) (HCC) 01/21/2022   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   HFrEF (heart failure with reduced ejection fraction) (HCC) 01/21/2022   Hyperlipidemia 01/21/2022   Lumbar stenosis 01/21/2022   Stroke (cerebrum) (HCC) 01/21/2022   S/P lumbar spinal fusion 11/15/2014   Lumbar discitis 11/29/2013   Discitis 11/24/2013   Diarrhea 11/17/2013   Osteomyelitis of lumbar spine (HCC) 11/16/2013   Hypertension    Asthma    Anxiety    Past Medical History:  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/16/2013   L3-4/notes 11/24/2013, arms, neck   Exertional asthma    GERD (gastroesophageal reflux disease)    Hypertension    Kidney stones    "have always passed them"   Neck pain    Osteomyelitis (HCC) 11/23/2013   osteomyelitis, discitis    Sleep concern    uses Prozac for sleep    Past Surgical History:  Past Surgical History:  Procedure Laterality Date   BREAST CYST EXCISION Left 2010   "polypectomy"   IR CT HEAD LTD  01/21/2022   IR PERCUTANEOUS ART THROMBECTOMY/INFUSION INTRACRANIAL INC DIAG ANGIO  01/21/2022   IR US GUIDE VASC ACCESS RIGHT  01/22/2022   PICC LINE PLACE PERIPHERAL ([facility] HX) Right    for use of Levaquin & Vancomycin, of note: she reports that she had a "flulike feeling"    RADIOLOGY WITH ANESTHESIA N/A 01/21/2022   Procedure: IR WITH ANESTHESIA;  Surgeon: Julieanne Cotton, MD;   Location: [facility] OR;  Service: Radiology;  Laterality: N/A;   RADIOLOGY WITH ANESTHESIA N/A 01/28/2022   Procedure: IR WITH ANESTHESIA;  Surgeon: Radiologist, Medication, MD;  Location: [facility] OR;  Service: Radiology;  Laterality: N/A;   TONSILLECTOMY AND ADENOIDECTOMY  1975   TUBAL LIGATION  1999   HPI:  Patient is a 66 y.o. 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 On 01/25/22, patient began to have symptoms of dizziness followed by nausea and vomiting. Symptoms improved but then on 9/5 but she felt generally weak and was having trouble finding words. Husband observed right facial droop. She presented to [facility] ED as code stroke on 01/28/22; CT head without acute abnormality but CTA unable to be performed due to IV infiltration. She was taken directly to IR for thrombectomy and intubated for this procedure, extubated in morning of 01/29/22. MRI brain showed new acute infarcts in left posterior frontal and anterior parietal cortex.   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 She was alert and attentive throughout. She followed commands to: open mouth, blink eyes, squeeze hand, 'point' to floor. She was not able to stick  out tongue which SLP observed to be resting on floor of oral cavity and deviated to the right. She was able to imitate oral motor movements to purse lips but not able to initiate a breath. She exhibited two instances of reflexive voicing when throat clearing but not able to voice on command or spontaneously. Aside from appropriate eye contact when SLP talking to her as well as tears forming on eyes when SLP acknowledging how frustrating  this must be for her, she did not initiate any attempts at verbal or non-verbal communication. When cued to point to objects and family members, she gestured with her left hand but did not localize a finger to point. SLP will continue to follow patient with initial focus on non-verbal communication, oral motor movements, attempts to phonate, ability to swallow PO's. SLP plans to return this PM for potential bedside swallow evaluation but at this point, do not expect her to be able to initiate a swallow and she will likely require short term non-oral nutrition.    SLP Assessment  SLP Recommendation/Assessment: Patient needs continued Speech Lanaguage Pathology Services SLP Visit Diagnosis: Apraxia (R48.2);Aphasia (R47.01);Cognitive communication deficit (R41.841)    Recommendations for follow up therapy are one component of a multi-disciplinary discharge planning process, led by the attending physician.  Recommendations may be updated based on patient status, additional functional criteria and insurance authorization.    Follow Up Recommendations  Acute inpatient rehab (3hours/day)    Assistance Recommended at Discharge  Frequent or constant Supervision/Assistance  Functional Status Assessment Patient has had a recent decline in their functional status and demonstrates the ability to make significant improvements in function in a reasonable and predictable amount of time.  Frequency and Duration min 3x week  2 weeks      SLP Evaluation Cognition  Overall Cognitive Status: Impaired/Different from baseline Arousal/Alertness: Awake/alert Orientation Level: Oriented to person;Other (comment) (difficult to determine secondary to nonverbal from suspected apraxia) Focused Attention: Appears intact Sustained Attention: Appears intact Memory:  (unable to assess) Awareness:  (UTA) Problem Solving:  (UTA) Safety/Judgment: Other (comment) Comments: UTA secondary to nonverbal       Comprehension   Auditory Comprehension Overall Auditory Comprehension: Impaired Commands: Impaired One Step Basic Commands: 50-74% accurate Interfering Components: Motor planning;Processing speed EffectiveTechniques: Extra processing time;Visual/Gestural cues;Repetition Visual Recognition/Discrimination Discrimination: Not tested Reading Comprehension Reading Status: Not tested    Expression Expression Primary Mode of Expression: Verbal Verbal Expression Overall Verbal Expression: Impaired Initiation: Impaired Other Verbal Expression Comments: Patient unable to vocalize, verbalize or mouth words Written Expression Dominant Hand: Right Written Expression: Not tested   Oral / Motor  Oral Motor/Sensory Function Overall Oral Motor/Sensory Function: Severe impairment Facial ROM: Reduced right Facial Symmetry: Abnormal symmetry right Facial Strength: Reduced right Facial Sensation: Reduced right Lingual ROM: Other (Comment) (unable to demonstrate lingual movement) Lingual Symmetry: Abnormal symmetry right Motor Speech Overall Motor Speech: Impaired Respiration: Impaired Phonation: Aphonic Motor Planning: Impaired            Angela Nevin, MA, CCC-SLP Speech Therapy

## 2022-01-29 NOTE — Progress Notes (Signed)
NAME:  CHARLES NIESE, MRN:  [id number], DOB:  02/10/1956, LOS: 1 ADMISSION DATE:  01/28/2022, CONSULTATION DATE:  9/5 REFERRING MD:  Dr. Iver Nestle, CHIEF COMPLAINT:  L MCA stroke s/p thrombectomy   History of Present Illness:  Patient is a 65 year old female with pertinent PMH of recent L MCA stroke and LV thrombus, HTN, HLD, hepatitis presents to [facility] ED on 9/5 with bilateral weakness.  Patient recently admitted to [facility] on 8/29 with L MCA m2-m3 occlusion stroke.  Patient received mechanical thrombectomy.  Echo showing LVEF 45 to 50% and LV thrombus measuring 1.84 x 1.2 cm.  Patient also with elevated troponins and was started on heparin.  Patient discharged on 8/31.  On 9/2 patient began experiencing symptoms of dizziness that was followed by nausea/vomiting.  Her symptoms seem to have improved but then on 9/5 patient stated she felt generally weak.  Around 9 AM she was having trouble finding words.  Husband states she had right facial droop.  Husband also states that she dropped something and tried to catch it and lost her balance in both her legs and when getting up is weaker on the right side.  Patient states she has not missed any doses of her Eliquis.  Patient came to [facility] ED for further work-up.  On arrival to [facility] ED on 9/5 as a code stroke.  CT head without acute abnormality.  Was unable to get CTA of head due to IV infiltration.  Neuro following. Neuro exam does show bilateral weakness and R facial droop.  Patient taken directly to IR for thrombectomy. Patient will go to ICU post thrombectomy. PCCM consulted to follow for medical management.  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/16/2013   L3-4/notes 11/24/2013, arms, neck   Exertional asthma    GERD (gastroesophageal reflux disease)    Hypertension    Kidney stones    "have always passed them"   Neck pain    Osteomyelitis (HCC) 11/23/2013   osteomyelitis,  discitis    Sleep concern    uses Prozac for sleep      Significant Hospital Events: Including procedures, antibiotic start and stop dates in addition to other pertinent events   8/29 L MCA stroke s/p thrombectomy; LV thrombus started on eliquis 8/31 discharged 9/5 L MCA stroke s/p thrombectomy; post op intubated  Interim History / Subjective:  Off sedation Remains on Cleviprex Tolerating spontaneous breathing trials  Objective   Blood pressure 134/66, pulse 89, temperature 98.3 F (36.8 C), temperature source Axillary, resp. rate 10, height 5\' 1"  (1.549 m), weight 59 kg, SpO2 100 %.    Vent Mode: CPAP;PSV FiO2 (%):  [40 %-100 %] 40 % Set Rate:  [18 bmp-20 bmp] 20 bmp Vt Set:  [380 mL] 380 mL PEEP:  [5 cmH20] 5 cmH20 Pressure Support:  [5 cmH20] 5 cmH20 Plateau Pressure:  [9 cmH20-15 cmH20] 9 cmH20   Intake/Output Summary (Last 24 hours) at 01/29/2022 1034 Last data filed at 01/29/2022 0800 Gross per 24 hour  Intake 815.89 ml  Output 1410 ml  Net -594.11 ml   Filed Weights   01/28/22 1332  Weight: 59 kg    Examination:   Physical exam: General: Crtitically ill-appearing female, orally intubated HEENT: [place]/AT, eyes anicteric.  ETT and OGT in place Neuro: Eyes open, following simple commands, neglect and plegic on the right upper extremity with right  facial droop, antigravity in all other extremities Chest: Coarse breath sounds, no wheezes or rhonchi Heart: Regular rate and rhythm, no murmurs or gallops Abdomen: Soft, nontender, nondistended, bowel sounds present Skin: No rash  Resolved Hospital Problem list     Assessment & Plan:  Acute left MCA stroke stroke s/p mechanical thrombectomy Recent left MCA stroke s/p thrombectomy on 8/29 Patient presented with right-sided weakness and right facial droop IR angio showing occluded L MCA M2 region; TICI 2c revascularization  Continue neuro watch every hour Stroke team is following MRI brain confirmed punctate left MCA  territory stroke Maintain SBP ~140 Continue secondary stroke prophylaxis Continue statin and IV heparin infusion  Left ventricular thrombus with recent NSTEMI Chronic HFrEF Recent echocardiogram showed EF 45-50% with apical LV thrombus Continue IV heparin infusion Patient's husband she is compliant with Eliquis at home  Postprocedure respiratory insufficiency Intermittent tachycardia ventilation Patient is tolerating supportive breathing We will try to extubate her today  HTN HLD Currently on clevidipine infusion once passed speech and swallow evaluation We will start oral antihypertensives Continue statin  Lumbar stenosis Hold oral pain meds Continue fentanyl as needed  Best Practice (right click and "Reselect all SmartList Selections" daily)   Diet/type: Speech and swallow evaluation postextubation DVT prophylaxis: Heparin infusion GI prophylaxis: PPI Lines: N/A Foley:  N/A Code Status:  full code Last date of multidisciplinary goals of care discussion: 9/6: Updated patient's husband at bedside  Labs   CBC: Recent Labs  Lab 01/26/22 1118 01/28/22 1330 01/28/22 1337 01/28/22 1747 01/29/22 0300  WBC 5.4 4.7  --   --  8.6  NEUTROABS  --  2.5  --   --  6.1  HGB 13.8 12.0 11.9* 10.2* 11.2*  HCT 44.1 35.5* 35.0* 30.0* 33.3*  MCV 93.2 88.5  --   --  87.6  PLT 170 171  --   --  173    Basic Metabolic Panel: Recent Labs  Lab 01/26/22 1557 01/28/22 1330 01/28/22 1337 01/28/22 1747 01/29/22 0845  NA 142 141 138 142 138  K 3.3* 3.8 4.0 3.7 3.5  CL 109 109 109  --  110  CO2 22 23  --   --  20*  GLUCOSE 95 115* 110*  --  148*  BUN 9 8 10   --  6*  CREATININE 0.61 0.84 0.70  --  0.56  CALCIUM 9.6 9.4  --   --  8.8*   GFR: Estimated Creatinine Clearance: 58.7 mL/min (by C-G formula based on SCr of 0.56 mg/dL). Recent Labs  Lab 01/26/22 1118 01/28/22 1330 01/29/22 0300  WBC 5.4 4.7 8.6    Liver Function Tests: Recent Labs  Lab 01/28/22 1330  AST 22   ALT 18  ALKPHOS 45  BILITOT 0.3  PROT 6.4*  ALBUMIN 3.9   No results for input(s): "LIPASE", "AMYLASE" in the last 168 hours. No results for input(s): "AMMONIA" in the last 168 hours.  ABG    Component Value Date/Time   PHART 7.330 (L) 01/28/2022 1747   PCO2ART 44.7 01/28/2022 1747   PO2ART 495 (H) 01/28/2022 1747   HCO3 23.7 01/28/2022 1747   TCO2 25 01/28/2022 1747   ACIDBASEDEF 2.0 01/28/2022 1747   O2SAT 100 01/28/2022 1747     Coagulation Profile: Recent Labs  Lab 01/28/22 1330  INR 1.2    Cardiac Enzymes: No results for input(s): "CKTOTAL", "CKMB", "CKMBINDEX", "TROPONINI" in the last 168 hours.  HbA1C: Hgb A1c MFr Bld  Date/Time Value Ref Range Status  01/22/2022 03:51 AM 5.0 4.8 - 5.6 % Final    Comment:    (NOTE) Pre diabetes:          5.7%-6.4%  Diabetes:              >6.4%  Glycemic control for   <7.0% adults with diabetes     CBG: Recent Labs  Lab 01/28/22 1328 01/28/22 1747  GLUCAP 125* 103*    Total critical care time: 41 minutes  Performed by: Cheri Fowler   Critical care time was exclusive of separately billable procedures and treating other patients.   Critical care was necessary to treat or prevent imminent or life-threatening deterioration.   Critical care was time spent personally by me on the following activities: development of treatment plan with patient and/or surrogate as well as nursing, discussions with consultants, evaluation of patient's response to treatment, examination of patient, obtaining history from patient or surrogate, ordering and performing treatments and interventions, ordering and review of laboratory studies, ordering and review of radiographic studies, pulse oximetry and re-evaluation of patient's condition.   Cheri Fowler, MD [facility] See Amion for pager If no response to pager, please call [phone] until 7pm After 7pm, Please call E-link [phone]

## 2022-01-29 NOTE — Progress Notes (Signed)
STROKE TEAM PROGRESS NOTE   SUBJECTIVE (INTERVAL HISTORY) Her husband and two daughters are at the bedside.  Overall her condition is stable. Pt still has expressive aphasia and right facial droop and right UE plegia. BP 120s, on cleviprex, will taper down. On heparin IV.   OBJECTIVE Temp:  [97.5 F (36.4 C)-98.4 F (36.9 C)] 98.3 F (36.8 C) (09/06 0750) Pulse Rate:  [63-104] 88 (09/06 1045) Cardiac Rhythm: Normal sinus rhythm (09/06 0800) Resp:  [10-21] 18 (09/06 1045) BP: (87-187)/(53-104) 127/64 (09/06 1045) SpO2:  [95 %-100 %] 100 % (09/06 1045) FiO2 (%):  [40 %-100 %] 40 % (09/06 0802) Weight:  [59 kg] 59 kg (09/05 1332)  Recent Labs  Lab 01/28/22 1328 01/28/22 1747  GLUCAP 125* 103*   Recent Labs  Lab 01/26/22 1557 01/28/22 1330 01/28/22 1337 01/28/22 1747 01/29/22 0845  NA 142 141 138 142 138  K 3.3* 3.8 4.0 3.7 3.5  CL 109 109 109  --  110  CO2 22 23  --   --  20*  GLUCOSE 95 115* 110*  --  148*  BUN $Re'9 8 10  'Dzd$ --  6*  CREATININE 0.61 0.84 0.70  --  0.56  CALCIUM 9.6 9.4  --   --  8.8*   Recent Labs  Lab 01/28/22 1330  AST 22  ALT 18  ALKPHOS 45  BILITOT 0.3  PROT 6.4*  ALBUMIN 3.9   Recent Labs  Lab 01/26/22 1118 01/28/22 1330 01/28/22 1337 01/28/22 1747 01/29/22 0300  WBC 5.4 4.7  --   --  8.6  NEUTROABS  --  2.5  --   --  6.1  HGB 13.8 12.0 11.9* 10.2* 11.2*  HCT 44.1 35.5* 35.0* 30.0* 33.3*  MCV 93.2 88.5  --   --  87.6  PLT 170 171  --   --  173   No results for input(s): "CKTOTAL", "CKMB", "CKMBINDEX", "TROPONINI" in the last 168 hours. Recent Labs    01/28/22 1330  LABPROT 15.0  INR 1.2   Recent Labs    01/28/22 1831  COLORURINE YELLOW  LABSPEC >1.046*  PHURINE 5.0  GLUCOSEU NEGATIVE  HGBUR NEGATIVE  BILIRUBINUR NEGATIVE  KETONESUR NEGATIVE  PROTEINUR 30*  NITRITE NEGATIVE  LEUKOCYTESUR NEGATIVE       Component Value Date/Time   CHOL 223 (H) 01/22/2022 0351   TRIG 271 (H) 01/29/2022 0300   HDL 40 (L) 01/22/2022 0351    CHOLHDL 5.6 01/22/2022 0351   VLDL 33 01/22/2022 0351   LDLCALC 150 (H) 01/22/2022 0351   Lab Results  Component Value Date   HGBA1C 5.0 01/22/2022      Component Value Date/Time   LABOPIA NONE DETECTED 01/28/2022 1831   COCAINSCRNUR NONE DETECTED 01/28/2022 1831   LABBENZ NONE DETECTED 01/28/2022 1831   AMPHETMU NONE DETECTED 01/28/2022 1831   THCU POSITIVE (A) 01/28/2022 1831   LABBARB NONE DETECTED 01/28/2022 1831    Recent Labs  Lab 01/28/22 1330  ETH <10    I have personally reviewed the radiological images below and agree with the radiology interpretations.  MR BRAIN WO CONTRAST  Result Date: 01/28/2022 CLINICAL DATA:  Right MCA stroke status post thrombectomy, follow-up EXAM: MRI HEAD WITHOUT CONTRAST MRA HEAD WITHOUT CONTRAST TECHNIQUE: Multiplanar, multi-echo pulse sequences of the brain and surrounding structures were acquired without intravenous contrast. Angiographic images of the Circle of Willis were acquired using MRA technique without intravenous contrast. COMPARISON:  01/22/2022 MRI head, no prior MRA, correlation is made with  CTA head and neck 01/21/2022 FINDINGS: Brain: New foci of restricted diffusion with ADC correlate in the left posterior frontal and anterior parietal cortex (series 5, images 80 8-95). Redemonstrated focus of restricted diffusion with ADC correlate in the left insula (series 5, image 76 and series 6, image 26). Other previously noted foci of restricted diffusion are decreased in conspicuity and do not have persistent ADC correlates. No acute hemorrhage, mass, mass effect, or midline shift. No hydrocephalus or extra-axial collection. Punctate focus of hemosiderin deposition in the left posterior frontal lobe. Minimal T2 hyperintense signal in the periventricular Ohlson matter and pons, likely the sequela of mild chronic small vessel ischemic disease. Vascular: Normal arterial flow voids. Skull and upper cervical spine: Normal marrow signal.  Sinuses/Orbits: Minimal mucosal thickening in the ethmoid air cells. The orbits are unremarkable. Other: The mastoids are well aerated. MRA HEAD FINDINGS Anterior circulation: Both internal carotid arteries are patent to the termini, without significant stenosis. A1 segments patent. Normal anterior communicating artery. Anterior cerebral arteries are patent to their distal aspects. No M1 stenosis or occlusion. Distal MCA branches perfused and symmetric.In particular, the left MCA branches appear perfused and without focal stenosis. Posterior circulation: Vertebral arteries patent to the vertebrobasilar junction without stenosis. Basilar patent to its distal aspect. Superior cerebellar arteries patent bilaterally. Patent P1 segments. PCAs perfused to their distal aspects without stenosis. Diminutive left posterior communicating artery. The right posterior communicating artery is not definitively visualized. Anatomic variants: None significant IMPRESSION: 1. 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 2. No intracranial large vessel occlusion or significant stenosis. In particular, left MCA branches appear perfused and without focal stenosis. These results will be called to the ordering clinician or representative by the Radiologist Assistant, and communication documented in the PACS or [employer]. Electronically Signed   By: Merilyn Baba M.D.   On: 01/28/2022 23:40   MR ANGIO HEAD WO CONTRAST  Result Date: 01/28/2022 CLINICAL DATA:  Right MCA stroke status post thrombectomy, follow-up EXAM: MRI HEAD WITHOUT CONTRAST MRA HEAD WITHOUT CONTRAST TECHNIQUE: Multiplanar, multi-echo pulse sequences of the brain and surrounding structures were acquired without intravenous contrast. Angiographic images of the Circle of Willis were acquired using MRA technique without intravenous contrast. COMPARISON:  01/22/2022 MRI head, no prior MRA, correlation is made with CTA head and neck  01/21/2022 FINDINGS: Brain: New foci of restricted diffusion with ADC correlate in the left posterior frontal and anterior parietal cortex (series 5, images 80 8-95). Redemonstrated focus of restricted diffusion with ADC correlate in the left insula (series 5, image 76 and series 6, image 26). Other previously noted foci of restricted diffusion are decreased in conspicuity and do not have persistent ADC correlates. No acute hemorrhage, mass, mass effect, or midline shift. No hydrocephalus or extra-axial collection. Punctate focus of hemosiderin deposition in the left posterior frontal lobe. Minimal T2 hyperintense signal in the periventricular Magloire matter and pons, likely the sequela of mild chronic small vessel ischemic disease. Vascular: Normal arterial flow voids. Skull and upper cervical spine: Normal marrow signal. Sinuses/Orbits: Minimal mucosal thickening in the ethmoid air cells. The orbits are unremarkable. Other: The mastoids are well aerated. MRA HEAD FINDINGS Anterior circulation: Both internal carotid arteries are patent to the termini, without significant stenosis. A1 segments patent. Normal anterior communicating artery. Anterior cerebral arteries are patent to their distal aspects. No M1 stenosis or occlusion. Distal MCA branches perfused and symmetric.In particular, the left MCA branches appear perfused and without focal stenosis.  Posterior circulation: Vertebral arteries patent to the vertebrobasilar junction without stenosis. Basilar patent to its distal aspect. Superior cerebellar arteries patent bilaterally. Patent P1 segments. PCAs perfused to their distal aspects without stenosis. Diminutive left posterior communicating artery. The right posterior communicating artery is not definitively visualized. Anatomic variants: None significant IMPRESSION: 1. 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 2. No intracranial large vessel occlusion or significant stenosis.  In particular, left MCA branches appear perfused and without focal stenosis. These results will be called to the ordering clinician or representative by the Radiologist Assistant, and communication documented in the PACS or [employer]. Electronically Signed   By: Merilyn Baba M.D.   On: 01/28/2022 23:40   [place] EKG SITE RITE  Result Date: 01/28/2022 If Site Rite image not attached, placement could not be confirmed due to current cardiac rhythm.  DG Abd 1 View  Result Date: 01/28/2022 CLINICAL DATA:  Evaluate OG tube placement EXAM: ABDOMEN - 1 VIEW COMPARISON:  None Available. FINDINGS: The OG tube terminates in the left upper quadrant, in the region of the stomach. IMPRESSION: The OG tube appears to terminate in the stomach. No other abnormalities. Electronically Signed   By: Dorise Bullion III M.D.   On: 01/28/2022 17:28   Portable Chest x-ray  Result Date: 01/28/2022 CLINICAL DATA:  [id number]. Encounter for ETT placement. EXAM: PORTABLE CHEST 1 VIEW COMPARISON:  Chest x-ray 01/26/2022, CT heart 01/23/2022 FINDINGS: Enteric tube with tip terminating 3 cm above the carina. Slightly more prominent aortic arch and aortic knob likely due to positioning and AP portable technique. Otherwise the heart and mediastinal contours are unchanged normal limits. No focal consolidation. No pulmonary edema. No pleural effusion. No pneumothorax. No acute osseous abnormality. IMPRESSION: 1. Slightly more prominent aortic arch and aortic knob likely due to positioning and AP portable technique. Finding can likely be further evaluated on CT angio head and neck 01/28/2022. 2. No active disease. Electronically Signed   By: Iven Finn M.D.   On: 01/28/2022 17:21   CT HEAD CODE STROKE WO CONTRAST  Result Date: 01/28/2022 CLINICAL DATA:  Code stroke.  Neuro deficit, acute, stroke suspected EXAM: CT HEAD WITHOUT CONTRAST TECHNIQUE: Contiguous axial images were obtained from the base of the skull through the vertex  without intravenous contrast. RADIATION DOSE REDUCTION: This exam was performed according to the departmental dose-optimization program which includes automated exposure control, adjustment of the mA and/or kV according to patient size and/or use of iterative reconstruction technique. COMPARISON:  CT head 01/21/2022. FINDINGS: Brain: No evidence of acute large vascular territory infarction, hemorrhage, hydrocephalus, extra-axial collection or mass lesion/mass effect. Vascular: No hyperdense vessel identified. Skull: No acute fracture. Sinuses/Orbits: Clear sinuses.  No acute orbital findings. Other: No mastoid effusions. ASPECTS [facility] Stroke Program Early CT Score) total score (0-10 with 10 being normal): 10. IMPRESSION: 1. No evidence of acute intracranial abnormality. 2. ASPECTS is 10. Code stroke imaging results were communicated on 01/28/2022 at 1:43 pm to provider Bhagat via secure text paging. Electronically Signed   By: Margaretha Sheffield M.D.   On: 01/28/2022 13:43   DG Chest 2 View  Result Date: 01/26/2022 CLINICAL DATA:  Chest and shoulder pain.  Recent MRI and CVA. EXAM: CHEST - 2 VIEW COMPARISON:  11/21/2021 FINDINGS: The lungs are clear without focal pneumonia, edema, pneumothorax or pleural effusion. The cardiopericardial silhouette is within normal limits for size. The visualized bony structures of the thorax are unremarkable. IMPRESSION: No active cardiopulmonary disease.  Electronically Signed   By: Misty Stanley M.D.   On: 01/26/2022 12:03   CT CORONARY MORPH W/CTA COR W/SCORE W/CA W/CM &/OR WO/CM  Addendum Date: 01/23/2022   ADDENDUM REPORT: 01/23/2022 13:25 EXAM: OVER-READ INTERPRETATION  CT CHEST The following report is a limited chest CT over-read performed by radiologist Dr. Lindaann Slough [facility] on 01/23/2022. This over-read does not include interpretation of cardiac or coronary anatomy or pathology. The coronary CTA interpretation by the cardiologist is attached.  COMPARISON:  Radiographs 11/21/2021. Coronary artery calcium score CT 10/04/2020 FINDINGS: Mediastinum/Nodes: No enlarged lymph nodes within the visualized mediastinum. Lungs/Pleura: There is no pleural effusion. The visualized lungs appear clear. Upper abdomen: No significant findings within the visualized upper abdomen. Musculoskeletal/Chest wall: No chest wall mass or suspicious osseous findings within the visualized chest. IMPRESSION: No significant extracardiac findings within the visualized chest. Electronically Signed   By: Richardean Sale M.D.   On: 01/23/2022 13:25   Result Date: 01/23/2022 HISTORY: Chest pain/anginal equiv, ECGs or troponins abnormal EXAM: Cardiac/Coronary CT TECHNIQUE: The patient was scanned on a [employer]. PROTOCOL: A 100 kV prospective scan was triggered in the descending thoracic aorta at 111 HU's. Axial non-contrast 3 mm slices were carried out through the heart. The data set was analyzed on a dedicated work station and scored using the Agatston method. Gantry rotation speed was 250 msecs and collimation was .6 mm. Heart rate was optimized medically and sl NTG was given. The 3D data set was reconstructed in 5% intervals of the 35-75 % of the R-R cycle. Systolic and diastolic phases were analyzed on a dedicated work station using MPR, MIP and VRT modes. The patient received contrast. FINDINGS: Coronary calcium score: The patient's coronary artery calcium score is 0, which places the patient in the 0 percentile. Coronary arteries: Normal coronary origins.  Right dominance. Right Coronary Artery: Normal caliber vessel, gives rise to small PDA. No significant plaque or stenosis. Left Main Coronary Artery: Normal caliber vessel. No significant plaque or stenosis. Small caliber ramus without significant plaque or stenosis. Left Anterior Descending Coronary Artery: Normal caliber vessel. Minimal noncalcified plaque with 1-24% stenosis. Gives rise to large first diagonal branch  without significant stenosis. Distal LAD wraps apex Left Circumflex Artery: Normal caliber vessel. Very trivial noncalcified plaque without significant stenosis. Gives rise to large first and second, small third OM branches. Aorta: Normal size, 31 mm at the mid ascending aorta (level of the PA bifurcation) measured double oblique. Trivial aortic atherosclerosis. No dissection seen in visualized portions of the aorta. Aortic Valve: No calcifications. Trileaflet. Other findings: Normal pulmonary vein drainage into the left atrium. Normal left atrial appendage without a thrombus. Appendage is chicken wing type. Normal size of the pulmonary artery. Normal appearance of the pericardium. There is an apical LV thrombus, dimensions saves on PACS images. Maximum dimension 15.6 x 11.3 mm There is a small accessory left atrial segment on the cranial aspect of the left atrium. IMPRESSION: 1.  Very minimal nonobstructive CAD, CADRADS = 1. 2. Coronary calcium score of 0. This was 0 percentile for age and sex matched control. 3. Normal coronary origin with right dominance. 4. There is an apical LV thrombus, dimensions saves on PACS images. Maximum dimension 15.6 x 11.3 mm Findings reported to ordering provider. INTERPRETATION: CAD-RADS 1: Minimal non-obstructive CAD (0-24%). Consider non-atherosclerotic causes of chest pain. Consider preventive therapy and risk factor modification. Electronically Signed: By: Buford Dresser M.D. 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 She initially presented on 01/21/2022 with transient episode of aphasia followed sudden  onset dizziness, nausea, vomiting, chest pain that radiated to upper back on Saturday. MRI brain showed small ischemic infarct of the frontal Mccreery matter on the left. Her troponins were elevated to over 300 and her echocardiogram showed apical hypokinesis and a left ventricular thrombus consistent with NSTEMI. Around 6 p.m. on 01/21/2022, patient developed worsening aphasia, NIHSS 6. Head CT was unremarkable. CT angiogram of the head and neck showed a left M2/M3-MCA occlusion. She was then transferred to our service for an emergency mechanical thrombectomy. EXAM: ULTRASOUND-GUIDED VASCULAR ACCESS DIAGNOSTIC CEREBRAL ANGIOGRAM MECHANICAL THROMBECTOMY FLAT PANEL HEAD CT COMPARISON:  CT/CT angiogram of the head and neck January 22, 2019 MEDICATIONS: No antibiotic was administered. ANESTHESIA/SEDATION: The procedure was performed under general anesthesia. CONTRAST:  75 mL of Omnipaque 300 milligram/mL. FLUOROSCOPY: Radiation Exposure Index (as provided by the fluoroscopic device): 454.0 mGy Kerma COMPLICATIONS: None immediate. TECHNIQUE: Informed written consent was obtained from the patient after a thorough discussion of the procedural risks, benefits and alternatives. All questions were addressed. Maximal Sterile Barrier Technique was utilized including caps, mask, sterile gowns, sterile gloves, sterile drape, hand hygiene and skin antiseptic. A timeout was performed prior to the initiation of the procedure. The right groin was prepped and draped in the usual sterile fashion. Using a micropuncture kit and the modified Seldinger technique, access was gained to the right common femoral artery and an 8 French sheath was placed. Real-time ultrasound guidance was utilized for vascular access including the acquisition of a permanent ultrasound image documenting patency of the accessed vessel. Under fluoroscopy, a Zoom 88 guide catheter was navigated over a 6 [place] VTK catheter and a 0.035" Terumo Glidewire into the aortic  arch. The catheter was placed into the left common carotid artery and then advanced into the left internal carotid artery. The diagnostic catheter was removed. Frontal and lateral angiograms of the head were obtained. FINDINGS: 1. Normal caliber of the right common femoral artery, adequate for vascular access. 2. There is no occlusion of the distal left M2/MCA superior division branch extending to the M3 segment. PROCEDURE: Using biplane roadmap, a Vect 46 aspiration catheter was navigated over an Aristotle 24 microguidewire into the cavernous segment of the left ICA. The aspiration catheter was then advanced over the wire to the level of occlusion in the M2-M3 segment and connected to an aspiration pump. Continuous aspiration was performed for 2 minutes. The guide catheter was connected to a VacLok syringe. The aspiration catheter was subsequently removed under constant aspiration. The guide catheter was aspirated for debris. Left internal carotid artery angiograms with magnified frontal and lateral views of the head showed recanalization of the left MCA with slow flow in a few distal cortical branches (TICI 2C). No embolus to new territory. Flat panel CT of the head was obtained and post processed in a separate workstation with concurrent attending physician supervision. Selected images were sent to PACS. No evidence of hemorrhagic complication. Delayed left internal carotid artery angiogram showed persistent patency of the left MCA. Right common femoral artery angiogram was obtained in right anterior oblique view. The puncture is at the level of the common femoral artery. The artery has normal caliber, adequate for closure  device. The sheath was exchanged over the wire for a Perclose prostyle which was utilized for access closure. Immediate hemostasis was achieved. IMPRESSION: 1. Successful mechanical thrombectomy for treatment of a distal left M2/MCA occlusion with direct contact aspiration achieving complete  recanalization (TICI 2C). 2. No thromboembolic or hemorrhagic complication. PLAN: Transfer to ICU for continued post stroke care. Electronically Signed   By: Pedro Earls M.D.   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 She initially presented on 01/21/2022 with transient episode of aphasia followed sudden onset dizziness, nausea, vomiting, chest pain that radiated to upper back on Saturday. MRI brain showed small ischemic infarct of the frontal Aten matter on the left. Her troponins were elevated to over 300 and her echocardiogram showed apical hypokinesis and a left ventricular thrombus consistent with NSTEMI. Around 6 p.m. on 01/21/2022, patient developed worsening aphasia, NIHSS 6. Head CT was unremarkable. CT angiogram of the head and neck showed a left M2/M3-MCA occlusion. She was then transferred to our service for an emergency mechanical thrombectomy. EXAM: ULTRASOUND-GUIDED VASCULAR ACCESS DIAGNOSTIC CEREBRAL ANGIOGRAM MECHANICAL THROMBECTOMY FLAT PANEL HEAD CT COMPARISON:  CT/CT angiogram of the head and neck January 22, 2019 MEDICATIONS: No antibiotic was administered. ANESTHESIA/SEDATION: The procedure was performed under general anesthesia. CONTRAST:  75 mL of Omnipaque 300 milligram/mL. FLUOROSCOPY: Radiation Exposure Index (as provided by the fluoroscopic device): 160.7 mGy Kerma COMPLICATIONS: None immediate. TECHNIQUE: Informed written consent was obtained from the patient after a thorough discussion of the procedural risks, benefits and alternatives. All questions were addressed. Maximal Sterile Barrier Technique was utilized including caps, mask, sterile gowns,  sterile gloves, sterile drape, hand hygiene and skin antiseptic. A timeout was performed prior to the initiation of the procedure. The right groin was prepped and draped in the usual sterile fashion. Using a micropuncture kit and the modified Seldinger technique, access was gained to the right common femoral artery and an 8 French sheath was placed. Real-time ultrasound guidance was utilized for vascular access including the acquisition of a permanent ultrasound image documenting patency of the accessed vessel. Under fluoroscopy, a Zoom 88 guide catheter was navigated over a 6 [place] VTK catheter and a 0.035" Terumo Glidewire into the aortic arch. The catheter was placed into the left common carotid artery and then advanced into the left internal carotid artery. The diagnostic catheter was removed. Frontal and lateral angiograms of the head were obtained. FINDINGS: 1. Normal caliber of the right common femoral artery, adequate for vascular access. 2. There is no occlusion of the distal left M2/MCA superior division branch extending to the M3 segment. PROCEDURE: Using biplane roadmap, a Vect 46 aspiration catheter was navigated over an Aristotle 24 microguidewire into the cavernous segment of the left ICA. The aspiration catheter was then advanced over the wire to the level of occlusion in the M2-M3 segment and connected to an aspiration pump. Continuous aspiration was performed for 2 minutes. The guide catheter was connected to a VacLok syringe. The aspiration catheter was subsequently removed under constant aspiration. The guide catheter was aspirated for debris. Left internal carotid artery angiograms with magnified frontal and lateral views of the head showed recanalization of the left MCA with slow flow in a few distal cortical branches (TICI 2C). No embolus to new  territory. Flat panel CT of the head was obtained and post processed in a separate workstation with concurrent attending physician supervision.  Selected images were sent to PACS. No evidence of hemorrhagic complication. Delayed left internal carotid artery angiogram showed persistent patency of the left MCA. Right common femoral artery angiogram was obtained in right anterior oblique view. The puncture is at the level of the common femoral artery. The artery has normal caliber, adequate for closure device. The sheath was exchanged over the wire for a Perclose prostyle which was utilized for access closure. Immediate hemostasis was achieved. IMPRESSION: 1. Successful mechanical thrombectomy for treatment of a distal left M2/MCA occlusion with direct contact aspiration achieving complete recanalization (TICI 2C). 2. No thromboembolic or hemorrhagic complication. PLAN: Transfer to ICU for continued post stroke care. Electronically Signed   By: Pedro Earls M.D.   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 She initially presented on 01/21/2022 with transient episode of aphasia followed sudden onset dizziness, nausea, vomiting, chest pain that radiated to upper back on Saturday. MRI brain showed small ischemic infarct of the frontal Duplessis matter on the left. Her troponins were elevated to over 300 and her echocardiogram showed apical hypokinesis and a left ventricular thrombus consistent with NSTEMI. Around 6 p.m. on 01/21/2022, patient developed worsening aphasia, NIHSS 6. Head CT was unremarkable. CT angiogram of the head and neck showed a left M2/M3-MCA occlusion. She was then transferred to our service for an emergency mechanical thrombectomy. EXAM: ULTRASOUND-GUIDED VASCULAR ACCESS DIAGNOSTIC CEREBRAL ANGIOGRAM  MECHANICAL THROMBECTOMY FLAT PANEL HEAD CT COMPARISON:  CT/CT angiogram of the head and neck January 22, 2019 MEDICATIONS: No antibiotic was administered. ANESTHESIA/SEDATION: The procedure was performed under general anesthesia. CONTRAST:  75 mL of Omnipaque 300 milligram/mL. FLUOROSCOPY: Radiation Exposure Index (as provided by the fluoroscopic device): 542.7 mGy Kerma COMPLICATIONS: None immediate. TECHNIQUE: Informed written consent was obtained from the patient after a thorough discussion of the procedural risks, benefits and alternatives. All questions were addressed. Maximal Sterile Barrier Technique was utilized including caps, mask, sterile gowns, sterile gloves, sterile drape, hand hygiene and skin antiseptic. A timeout was performed prior to the initiation of the procedure. The right groin was prepped and draped in the usual sterile fashion. Using a micropuncture kit and the modified Seldinger technique, access was gained to the right common femoral artery and an 8 French sheath was placed. Real-time ultrasound guidance was utilized for vascular access including the acquisition of a permanent ultrasound image documenting patency of the accessed vessel. Under fluoroscopy, a Zoom 88 guide catheter was navigated over a 6 [place] VTK catheter and a 0.035" Terumo Glidewire into the aortic arch. The catheter was placed into the left common carotid artery and then advanced into the left internal carotid artery. The diagnostic catheter was removed. Frontal and lateral angiograms of the head were obtained. FINDINGS: 1. Normal caliber of the right common femoral artery, adequate for vascular access. 2. There is no occlusion of the distal left M2/MCA superior division branch extending to the M3 segment. PROCEDURE: Using biplane roadmap, a Vect 46 aspiration catheter was navigated over an Aristotle 24 microguidewire into the cavernous segment of the left ICA. The aspiration catheter was then advanced over the wire to  the level  of occlusion in the M2-M3 segment and connected to an aspiration pump. Continuous aspiration was performed for 2 minutes. The guide catheter was connected to a VacLok syringe. The aspiration catheter was subsequently removed under constant aspiration. The guide catheter was aspirated for debris. Left internal carotid artery angiograms with magnified frontal and lateral views of the head showed recanalization of the left MCA with slow flow in a few distal cortical branches (TICI 2C). No embolus to new territory. Flat panel CT of the head was obtained and post processed in a separate workstation with concurrent attending physician supervision. Selected images were sent to PACS. No evidence of hemorrhagic complication. Delayed left internal carotid artery angiogram showed persistent patency of the left MCA. Right common femoral artery angiogram was obtained in right anterior oblique view. The puncture is at the level of the common femoral artery. The artery has normal caliber, adequate for closure device. The sheath was exchanged over the wire for a Perclose prostyle which was utilized for access closure. Immediate hemostasis was achieved. IMPRESSION: 1. Successful mechanical thrombectomy for treatment of a distal left M2/MCA occlusion with direct contact aspiration achieving complete recanalization (TICI 2C). 2. No thromboembolic or hemorrhagic complication. PLAN: Transfer to ICU for continued post stroke care. Electronically Signed   By: Pedro Earls M.D.   On: 01/22/2022 14:25   MR BRAIN WO CONTRAST  Result Date: 01/22/2022 CLINICAL DATA:  Follow-up stroke EXAM: MRI HEAD WITHOUT CONTRAST TECHNIQUE: Multiplanar, multiecho pulse sequences of the brain and surrounding structures were obtained without intravenous contrast. COMPARISON:  Brain MRI and CT/CTA head and neck 1 day prior FINDINGS: Brain: A punctate fusion of diffusion restriction in the left frontal lobe periventricular [place]  matter is unchanged compared to the study from 1 day prior. Additional small foci of diffusion restriction in the left external capsule, insula, and frontal lobe cortex are new. There is no associated hemorrhage or mass effect. Parenchymal volume is normal. The ventricles are normal in size. Parenchymal signal is otherwise normal, with no significant burden of under vitamin chronic small vessel ischemic change. There is no mass lesion.  There is no mass effect or midline shift. Vascular: Normal flow voids. Skull and upper cervical spine: Normal marrow signal. Sinuses/Orbits: The paranasal sinuses are clear. The globes and orbits are unremarkable. Other: None. IMPRESSION: New punctate acute infarcts in the left external capsule, insula, and frontal lobe cortex, and unchanged additional punctate infarct in the left frontal lobe [place] matter. Electronically Signed   By: Valetta Mole M.D.   On: 01/22/2022 11:39   CT CEREBRAL PERFUSION W CONTRAST  Result Date: 01/21/2022 CLINICAL DATA:  Neuro deficit, acute, stroke suspected. EXAM: CT ANGIOGRAPHY HEAD AND NECK CT PERFUSION BRAIN TECHNIQUE: Multidetector CT imaging of the head and neck was performed using the standard protocol during bolus administration of intravenous contrast. Multiplanar CT image reconstructions and MIPs were obtained to evaluate the vascular anatomy. Carotid stenosis measurements (when applicable) are obtained utilizing NASCET criteria, using the distal internal carotid diameter as the denominator. Multiphase CT imaging of the brain was performed following IV bolus contrast injection. Subsequent parametric perfusion maps were calculated using RAPID software. RADIATION DOSE REDUCTION: This exam was performed according to the departmental dose-optimization program which includes automated exposure control, adjustment of the mA and/or kV according to patient size and/or use of iterative reconstruction technique. CONTRAST:  142mL OMNIPAQUE IOHEXOL 350  MG/ML SOLN COMPARISON:  Brain MRI 01/21/2022. Noncontrast head CT performed earlier today 01/21/2022. FINDINGS: CTA NECK  FINDINGS Aortic arch: Standard aortic branching. Mild atherosclerotic plaque within the visualized aortic arch and proximal major branch vessels of the neck. Streak and beam hardening artifact arising from a dense right-sided contrast bolus partially obscures the right subclavian artery. Within this limitation, there is no appreciable hemodynamically significant innominate or proximal subclavian artery stenosis. Right carotid system: CCA and ICA patent within the neck without stenosis. Mild atherosclerotic plaque about the carotid bifurcation. Left carotid system: CCA and ICA patent within the neck without stenosis. Minimal atherosclerotic plaque about the carotid bifurcation. Vertebral arteries: Vertebral arteries codominant and patent within the neck without stenosis. Nonstenotic atherosclerotic plaque at the origin of the right vertebral artery. Skeleton: Cervical spondylosis. No acute fracture or aggressive osseous lesion. Other neck: No neck mass or cervical lymphadenopathy. Upper chest: No consolidation within the imaged lung apices. Review of the MIP images confirms the above findings CTA HEAD FINDINGS Anterior circulation: The intracranial internal carotid arteries are patent. The M1 middle cerebral arteries are patent. Occluded mid M2 left MCA vessel (with distal reconstitution, likely due to collateral flow) (for instance as seen on series 15, image 15). The anterior cerebral arteries are patent. No intracranial aneurysm is identified. Posterior circulation: The intracranial vertebral arteries are patent. The basilar artery is patent. The posterior cerebral arteries are patent. Posterior communicating arteries are diminutive or absent, bilaterally. Venous sinuses: Within the limitations of contrast timing, no convincing thrombus. Anatomic variants: As described. Review of the MIP images  confirms the above findings CT Brain Perfusion Findings: CBF (<30%) Volume: 67mL Perfusion (Tmax>6.0s) volume: 23mL Mismatch Volume: 38mL Infarction Location:None identified CTA head impression and CT perfusion head impression called by telephone at the time of interpretation on 01/21/2022 at 6:50 pm to provider MCNEILL [facility] , who verbally acknowledged these results. IMPRESSION: CTA neck: 1. The common carotid, internal carotid and vertebral arteries are patent within the neck without stenosis. Minimal non-stenotic atherosclerotic plaque about the carotid bifurcations and at the origin of the right vertebral artery. 2.  Aortic Atherosclerosis (ICD10-I70.0). CTA head: Occluded mid M2 left MCA vessel (with distal reconstitution, likely due to collateral flow). CT perfusion head: The perfusion software identifies a 7 mL region of critically hypoperfused parenchyma within the left insula/frontal lobe (utilizing the Tmax>6 seconds threshold). No core infarct is identified. Reported mismatch volume: 7 mL. Electronically Signed   By: Kellie Simmering D.O.   On: 01/21/2022 19:09   CT ANGIO HEAD NECK W WO CM (CODE STROKE)  Result Date: 01/21/2022 CLINICAL DATA:  Neuro deficit, acute, stroke suspected. EXAM: CT ANGIOGRAPHY HEAD AND NECK CT PERFUSION BRAIN TECHNIQUE: Multidetector CT imaging of the head and neck was performed using the standard protocol during bolus administration of intravenous contrast. Multiplanar CT image reconstructions and MIPs were obtained to evaluate the vascular anatomy. Carotid stenosis measurements (when applicable) are obtained utilizing NASCET criteria, using the distal internal carotid diameter as the denominator. Multiphase CT imaging of the brain was performed following IV bolus contrast injection. Subsequent parametric perfusion maps were calculated using RAPID software. RADIATION DOSE REDUCTION: This exam was performed according to the departmental dose-optimization program which includes  automated exposure control, adjustment of the mA and/or kV according to patient size and/or use of iterative reconstruction technique. CONTRAST:  177mL OMNIPAQUE IOHEXOL 350 MG/ML SOLN COMPARISON:  Brain MRI 01/21/2022. Noncontrast head CT performed earlier today 01/21/2022. FINDINGS: CTA NECK FINDINGS Aortic arch: Standard aortic branching. Mild atherosclerotic plaque within the visualized aortic arch and proximal major branch vessels of the neck. Streak  and beam hardening artifact arising from a dense right-sided contrast bolus partially obscures the right subclavian artery. Within this limitation, there is no appreciable hemodynamically significant innominate or proximal subclavian artery stenosis. Right carotid system: CCA and ICA patent within the neck without stenosis. Mild atherosclerotic plaque about the carotid bifurcation. Left carotid system: CCA and ICA patent within the neck without stenosis. Minimal atherosclerotic plaque about the carotid bifurcation. Vertebral arteries: Vertebral arteries codominant and patent within the neck without stenosis. Nonstenotic atherosclerotic plaque at the origin of the right vertebral artery. Skeleton: Cervical spondylosis. No acute fracture or aggressive osseous lesion. Other neck: No neck mass or cervical lymphadenopathy. Upper chest: No consolidation within the imaged lung apices. Review of the MIP images confirms the above findings CTA HEAD FINDINGS Anterior circulation: The intracranial internal carotid arteries are patent. The M1 middle cerebral arteries are patent. Occluded mid M2 left MCA vessel (with distal reconstitution, likely due to collateral flow) (for instance as seen on series 15, image 15). The anterior cerebral arteries are patent. No intracranial aneurysm is identified. Posterior circulation: The intracranial vertebral arteries are patent. The basilar artery is patent. The posterior cerebral arteries are patent. Posterior communicating arteries are  diminutive or absent, bilaterally. Venous sinuses: Within the limitations of contrast timing, no convincing thrombus. Anatomic variants: As described. Review of the MIP images confirms the above findings CT Brain Perfusion Findings: CBF (<30%) Volume: 62mL Perfusion (Tmax>6.0s) volume: 71mL Mismatch Volume: 34mL Infarction Location:None identified CTA head impression and CT perfusion head impression called by telephone at the time of interpretation on 01/21/2022 at 6:50 pm to provider MCNEILL [facility] , who verbally acknowledged these results. IMPRESSION: CTA neck: 1. The common carotid, internal carotid and vertebral arteries are patent within the neck without stenosis. Minimal non-stenotic atherosclerotic plaque about the carotid bifurcations and at the origin of the right vertebral artery. 2.  Aortic Atherosclerosis (ICD10-I70.0). CTA head: Occluded mid M2 left MCA vessel (with distal reconstitution, likely due to collateral flow). CT perfusion head: The perfusion software identifies a 7 mL region of critically hypoperfused parenchyma within the left insula/frontal lobe (utilizing the Tmax>6 seconds threshold). No core infarct is identified. Reported mismatch volume: 7 mL. Electronically Signed   By: Kellie Simmering D.O.   On: 01/21/2022 19:09   CT HEAD CODE STROKE WO CONTRAST  Result Date: 01/21/2022 CLINICAL DATA:  Code stroke. Neuro deficit, acute, stroke suspected. EXAM: CT HEAD WITHOUT CONTRAST TECHNIQUE: Contiguous axial images were obtained from the base of the skull through the vertex without intravenous contrast. RADIATION DOSE REDUCTION: This exam was performed according to the departmental dose-optimization program which includes automated exposure control, adjustment of the mA and/or kV according to patient size and/or use of iterative reconstruction technique. COMPARISON:  Same-day brain MRI 01/21/2022. FINDINGS: Brain: No age advanced or lobar predominant parenchymal atrophy. A subcentimeter acute  infarct within the left frontal lobe [place] matter (adjacent to the left lateral ventricle frontal horn) is occult by CT and was better appreciated on the brain MRI performed earlier today. No CT evidence of interval acute infarct. Minimal chronic small-vessel image changes within the cerebral [place] matter and pons, better appreciated on the prior brain MRI. There is no acute intracranial hemorrhage. No extra-axial fluid collection. No evidence of an intracranial mass. No midline shift. Vascular: No hyperdense vessel. Skull: No fracture or aggressive osseous lesion. Sinuses/Orbits: No mass or acute finding within the imaged orbits. Trace mucosal thickening within the anterior left ethmoid air cells. ASPECTS [facility] Stroke Program Early  CT Score) - Ganglionic level infarction (caudate, lentiform nuclei, internal capsule, insula, M1-M3 cortex): 7 - Supraganglionic infarction (M4-M6 cortex): 3 Total score (0-10 with 10 being normal): 10 These results were communicated to Dr. Leonel Ramsay at 6:27 pmon 8/29/2023by text page via the [facility] messaging system. IMPRESSION: A known subcentimeter acute infarct within the left frontal lobe Biegler matter is occult by CT, and was better appreciated on the brain MRI performed earlier today. No CT evidence of interval acute intracranial abnormality. Minimal chronic small-vessel ischemic changes within the cerebral [place]l matter and pons. Electronically Signed   By: Kellie Simmering D.O.   On: 01/21/2022 18:27   ECHOCARDIOGRAM COMPLETE  Result Date: 01/21/2022    ECHOCARDIOGRAM REPORT   Patient Name:   SHEMECA LUKASIK Ceesay Date of Exam: 01/21/2022 Medical Rec #:  [id number]    Height:       61.0 in Accession #:    [id number]   Weight:       171.0 lb Date of Birth:  12/31/1955    BSA:          1.767 m Patient Age:    70 years     BP:           146/125 mmHg Patient Gender: F            HR:           72 bpm. Exam Location:  Inpatient Procedure: 2D Echo, Color Doppler and Cardiac Doppler Indications:     Stroke i63.9  History:        Patient has no prior history of Echocardiogram examinations.                 Risk Factors:Hypertension.  Sonographer:    Raquel Sarna Senior RDCS Referring Phys: [place]  1. Left ventricular ejection fraction, by estimation, is 45 to 50%. The left ventricle has mildly decreased function. The left ventricle demonstrates regional wall motion abnormalities (apical hypokinesis with out true aneurysm and with presence of an LV thrombus 1.84 X 0.9 X 1.2 cm).  2. The mitral valve is grossly normal. No evidence of mitral valve regurgitation. No evidence of mitral stenosis.  3. The aortic valve is tricuspid. Aortic valve regurgitation is not visualized. No aortic stenosis is present.  4. Right ventricular systolic function is normal. The right ventricular size is normal. Tricuspid regurgitation signal is inadequate for assessing PA pressure.  5. The inferior vena cava is normal in size with greater than 50% respiratory variability, suggesting right atrial pressure of 3 mmHg. Comparison(s): No prior Echocardiogram. 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 Cardiology aware. FINDINGS  Left Ventricle: Left ventricular ejection fraction, by estimation, is 45 to 50%. The left ventricle has mildly decreased function. The left ventricle demonstrates regional wall motion abnormalities. The left ventricular internal cavity size was small. There is no left ventricular hypertrophy. Left ventricular diastolic parameters are consistent with Grade I diastolic dysfunction (impaired relaxation).  LV Wall Scoring: The apical lateral segment, apical septal segment, apical anterior segment, and apical inferior segment are hypokinetic. Right Ventricle: The right ventricular size is normal. No increase in right ventricular wall thickness. Right ventricular systolic function is normal. Tricuspid regurgitation signal is inadequate for assessing PA pressure. Left  Atrium: Left atrial size was normal in size. Right Atrium: Right atrial size was normal in size. Pericardium: There is no evidence of pericardial effusion. Mitral Valve: The mitral valve is grossly normal. No evidence of mitral valve regurgitation.  No evidence of mitral valve stenosis. Tricuspid Valve: The tricuspid valve is normal in structure. Tricuspid valve regurgitation is not demonstrated. No evidence of tricuspid stenosis. Aortic Valve: The aortic valve is tricuspid. Aortic valve regurgitation is not visualized. No aortic stenosis is present. Pulmonic Valve: The pulmonic valve was normal in structure. Pulmonic valve regurgitation is not visualized. No evidence of pulmonic stenosis. Aorta: The aortic root and ascending aorta are structurally normal, with no evidence of dilitation. Venous: The inferior vena cava is normal in size with greater than 50% respiratory variability, suggesting right atrial pressure of 3 mmHg. IAS/Shunts: No atrial level shunt detected by color flow Doppler.  LEFT VENTRICLE PLAX 2D LVIDd:         3.60 cm     Diastology LVIDs:         2.50 cm     LV e' medial:    6.68 cm/s LV PW:         0.80 cm     LV E/e' medial:  6.5 LV IVS:        0.80 cm     LV e' lateral:   11.00 cm/s LVOT diam:     1.70 cm     LV E/e' lateral: 3.9 LV SV:         42 LV SV Index:   24 LVOT Area:     2.27 cm  LV Volumes (MOD) LV vol d, MOD A2C: 70.1 ml LV vol d, MOD A4C: 46.6 ml LV vol s, MOD A2C: 37.3 ml LV vol s, MOD A4C: 23.3 ml LV SV MOD A2C:     32.8 ml LV SV MOD A4C:     46.6 ml LV SV MOD BP:      28.7 ml RIGHT VENTRICLE RV S prime:     9.32 cm/s TAPSE (M-mode): 1.7 cm LEFT ATRIUM             Index        RIGHT ATRIUM          Index LA diam:        2.70 cm 1.53 cm/m   RA Area:     9.52 cm LA Vol (A2C):   28.1 ml 15.90 ml/m  RA Volume:   20.50 ml 11.60 ml/m LA Vol (A4C):   26.3 ml 14.88 ml/m LA Biplane Vol: 28.8 ml 16.30 ml/m  AORTIC VALVE LVOT Vmax:   115.00 cm/s LVOT Vmean:  68.700 cm/s LVOT VTI:     0.185 m  AORTA Ao Root diam: 2.90 cm Ao Asc diam:  3.20 cm MITRAL VALVE MV Area (PHT): 3.11 cm    SHUNTS MV Decel Time: 244 msec    Systemic VTI:  0.18 m MV E velocity: 43.20 cm/s  Systemic Diam: 1.70 cm MV A velocity: 64.50 cm/s MV E/A ratio:  0.67 Rudean Haskell MD Electronically signed by Rudean Haskell MD Signature Date/Time: 01/21/2022/4:04:02 PM    Final    MR BRAIN WO CONTRAST  Result Date: 01/21/2022 CLINICAL DATA:  TIA.  Sudden onset of aphasia, now resolved. EXAM: MRI HEAD WITHOUT CONTRAST TECHNIQUE: Multiplanar, multiecho pulse sequences of the brain and surrounding structures were obtained without intravenous contrast. COMPARISON:  None Available. FINDINGS: Brain: Small acute infarct in the left frontal Heying matter. No hemorrhage, hydrocephalus, mass, or atrophy. Minimal chronic small vessel changes may be present in the pons. No prior infarct is seen. Vascular: Major flow voids are preserved Skull and upper cervical spine: Normal marrow signal. Degenerative facet  spurring where covered in the cervical spine with mild C2-3 anterolisthesis. Sinuses/Orbits: Negative IMPRESSION: Small acute infarct in the left frontal Arnaud matter. Electronically Signed   By: Jorje Guild M.D.   On: 01/21/2022 12:32     PHYSICAL EXAM  Temp:  [97.5 F (36.4 C)-98.4 F (36.9 C)] 98.3 F (36.8 C) (09/06 0750) Pulse Rate:  [63-104] 88 (09/06 1045) Resp:  [10-21] 18 (09/06 1045) BP: (87-187)/(53-104) 127/64 (09/06 1045) SpO2:  [95 %-100 %] 100 % (09/06 1045) FiO2 (%):  [40 %-100 %] 40 % (09/06 0802) Weight:  [59 kg] 59 kg (09/05 1332)  General - Well nourished, well developed, in no apparent distress.  Ophthalmologic - fundi not visualized due to noncooperation.  Cardiovascular - Regular rhythm and rate.  Neuro - awake, alert, eyes open, expressive aphasia, following most simple commands.  Not able to name and repeat. No gaze palsy, tracking bilaterally, blinking to visual threat  bilaterally.  Right mild facial droop. Tongue protrusion difficulty. LUE 5/5, no drift, RUE plegic. Bilaterally LEs 5/5, no drift. Sensation symmetrical bilaterally subjectively, left FTN intact, gait not tested.    ASSESSMENT/PLAN Ms. BROOKELIN FELBER is a 66 y.o. 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 No tPA given due to on Eliquis and recent stroke.    Stroke:  left MCA punctate scattered infarct with left M2 occlusion s/p IR with FBPZ0C, embolic secondary to LV thrombus recently on Eliquis CT no acute abnormality IR left superior M2 occlusion, inferior M3/M4 nonocclusive thrombus Status post EVT with TICI2c MRI left insular cortex and frontal lobe punctate infarcts MRA unremarkable 2D Echo EF 45 to 50% on 8/29, LV regional motion abnormality with LV thrombus LDL 150 on 8/29 HgbA1c 5 0 on 8/29 UDS positive for THC Hypercoagulable and autoimmune work up pending Heparin IV for VTE prophylaxis aspirin 81 mg daily and Eliquis (apixaban) daily prior to admission, now on heparin IV.  Ongoing aggressive stroke risk factor management Therapy recommendations:  CIR Disposition:  pending  Respiratory failure Intubated for IR Remains intubated due to mental status Extubated 01/29/2022 Tolerating well Appreciate CCM assistance  Hx of stroke and LV thrombus 8/29 admitted for nausea vomiting dizziness chest pain and then developed right facial droop and aphasia.  Found to have troponin > 300, EF 45 to 50% with LV thrombus.  CTA coronary artery showed minimal CAD.  CT no acute abnormality.  CTA head and neck showed left M2 occlusion.  CTP 0/7.  Status post EVT with TICI2c.  MRI showed left frontal [place] matter punctate infarct.  Repeat MRI showed left external capsule, insular cortex  and frontal cortex punctate infarcts.  LDL 150, A1c 5.0.  UDS positive for THC.  Patient was put on heparin IV and aspirin and eventually discharged on Eliquis and aspirin as well as Crestor 20. Per family, patient has slight expressive aphasia since discharge, no physical deficit.  Hypertension Stable on the low end BP goal < 180/105 post IR 24 hours Taper off Cleviprex Avoid low BP Long term BP goal normotensive  Hyperlipidemia Home meds: Crestor 20 LDL 150 on 8/29, goal < 70 Resume Crestor once p.o. access Continue statin at discharge  Dysphagia Did not pass swallow Currently n.p.o. 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  Lupus - ANA and ds DNA pending  Hospital day # 1  This patient is critically ill due to recurrent stroke with LVO status post mechanical thrombectomy, LV thrombus, cardiomyopathy, dysphagia, respiratory failure and at significant risk of neurological worsening, death form hemorrhagic transmission, stroke recurrence, embolism, heart failure, aspiration pneumonia, seizure. This patient's care requires constant monitoring of vital signs, hemodynamics, respiratory and cardiac monitoring, review of multiple databases, neurological assessment, discussion with family, other specialists and medical decision making of high complexity. I spent 45 minutes of neurocritical care time in the care of this patient. I had long discussion with husband and 2 daughters at bedside, updated pt current condition, treatment plan and potential prognosis, and answered all the questions.  They expressed understanding and appreciation.     Rosalin Hawking, MD PhD Stroke Neurology 01/29/2022 11:13 AM    To contact Stroke Continuity provider, please refer to [email or web address] After hours, contact General Neurology

## 2022-01-29 NOTE — Progress Notes (Signed)
OT Cancellation Note  Patient Details Name: MIKAYLA CHIUSANO MRN: [id number] DOB: 06/07/1955   Cancelled Treatment:    Reason Eval/Treat Not Completed: Active bedrest order (OT eval to f/u when pt's activity orders progress)  Yaresly Menzel A Endi Lagman 01/29/2022, 7:48 AM

## 2022-01-30 ENCOUNTER — Inpatient Hospital Stay (HOSPITAL_COMMUNITY): Payer: Federal, State, Local not specified - PPO

## 2022-01-30 DIAGNOSIS — I10 Essential (primary) hypertension: Secondary | ICD-10-CM | POA: Diagnosis not present

## 2022-01-30 DIAGNOSIS — Z8673 Personal history of transient ischemic attack (TIA), and cerebral infarction without residual deficits: Secondary | ICD-10-CM | POA: Diagnosis not present

## 2022-01-30 DIAGNOSIS — I5181 Takotsubo syndrome: Secondary | ICD-10-CM | POA: Diagnosis not present

## 2022-01-30 DIAGNOSIS — I6602 Occlusion and stenosis of left middle cerebral artery: Secondary | ICD-10-CM | POA: Diagnosis not present

## 2022-01-30 DIAGNOSIS — I639 Cerebral infarction, unspecified: Secondary | ICD-10-CM | POA: Diagnosis not present

## 2022-01-30 DIAGNOSIS — I513 Intracardiac thrombosis, not elsewhere classified: Secondary | ICD-10-CM | POA: Diagnosis not present

## 2022-01-30 LAB — BASIC METABOLIC PANEL
Anion gap: 6 (ref 5–15)
Anion gap: 7 (ref 5–15)
BUN: <5 mg/dL — ABNORMAL LOW (ref 8–23)
BUN: <5 mg/dL — ABNORMAL LOW (ref 8–23)
CO2: 21 mmol/L — ABNORMAL LOW (ref 22–32)
CO2: 22 mmol/L (ref 22–32)
Calcium: 8.3 mg/dL — ABNORMAL LOW (ref 8.9–10.3)
Calcium: 8.6 mg/dL — ABNORMAL LOW (ref 8.9–10.3)
Chloride: 112 mmol/L — ABNORMAL HIGH (ref 98–111)
Chloride: 109 mmol/L (ref 98–111)
Creatinine, Ser: 0.65 mg/dL (ref 0.44–1.00)
Creatinine, Ser: 0.56 mg/dL (ref 0.44–1.00)
GFR, Estimated: >60 mL/min (ref >60)
GFR, Estimated: >60 mL/min (ref >60)
Glucose, Bld: 100 mg/dL — ABNORMAL HIGH (ref 70–99)
Glucose, Bld: 98 mg/dL (ref 70–99)
Potassium: 2.9 mmol/L — ABNORMAL LOW (ref 3.5–5.1)
Potassium: 3.5 mmol/L (ref 3.5–5.1)
Sodium: 139 mmol/L (ref 135–145)
Sodium: 138 mmol/L (ref 135–145)

## 2022-01-30 LAB — TSH: TSH: 1.712 u[IU]/mL (ref 0.350–4.500)

## 2022-01-30 LAB — PROTIME-INR
INR: 1.1 (ref 0.8–1.2)
Prothrombin Time: 14.4 seconds (ref 11.4–15.2)

## 2022-01-30 LAB — CBC
HCT: 27.3 % — ABNORMAL LOW (ref 36.0–46.0)
Hemoglobin: 9.2 g/dL — ABNORMAL LOW (ref 12.0–15.0)
MCH: 29.1 pg (ref 26.0–34.0)
MCHC: 33.7 g/dL (ref 30.0–36.0)
MCV: 86.4 fL (ref 80.0–100.0)
Platelets: 143 10*3/uL — ABNORMAL LOW (ref 150–400)
RBC: 3.16 MIL/uL — ABNORMAL LOW (ref 3.87–5.11)
RDW: 11.8 % (ref 11.5–15.5)
WBC: 5.3 10*3/uL (ref 4.0–10.5)
nRBC: 0 % (ref 0.0–0.2)

## 2022-01-30 LAB — MAGNESIUM: Magnesium: 1.9 mg/dL (ref 1.7–2.4)

## 2022-01-30 LAB — VITAMIN B12: Vitamin B-12: 487 pg/mL (ref 180–914)

## 2022-01-30 LAB — RPR: RPR Ser Ql: NONREACTIVE

## 2022-01-30 LAB — HEPARIN LEVEL (UNFRACTIONATED)
Heparin Unfractionated: 0.6 IU/mL (ref 0.30–0.70)
Heparin Unfractionated: 0.52 IU/mL (ref 0.30–0.70)

## 2022-01-30 LAB — PHOSPHORUS: Phosphorus: 1.8 mg/dL — ABNORMAL LOW (ref 2.5–4.6)

## 2022-01-30 LAB — APTT
aPTT: 77 seconds — ABNORMAL HIGH (ref 24–36)
aPTT: 72 seconds — ABNORMAL HIGH (ref 24–36)

## 2022-01-30 MED ORDER — POTASSIUM CHLORIDE 10 MEQ/50ML IV SOLN
10.0000 meq | INTRAVENOUS | Status: DC
  Administered 2022-01-30 (×4): 10 meq via INTRAVENOUS
  Filled 2022-01-30 (×2): qty 50

## 2022-01-30 MED ORDER — POTASSIUM CHLORIDE 10 MEQ/50ML IV SOLN
10.0000 meq | INTRAVENOUS | Status: DC
  Administered 2022-01-30: 10 meq via INTRAVENOUS
  Filled 2022-01-30: qty 50

## 2022-01-30 MED ORDER — POTASSIUM CHLORIDE 20 MEQ PO PACK: 40.0000 meq | PACK | Freq: Once | ORAL | Status: DC

## 2022-01-30 MED ORDER — POLYETHYLENE GLYCOL 3350 17 G PO PACK
17.0000 g | PACK | Freq: Every day | ORAL | Status: DC
  Administered 2022-01-31 – 2022-02-04 (×5): 17 g via ORAL
  Filled 2022-01-30 (×5): qty 1

## 2022-01-30 MED ORDER — POTASSIUM CHLORIDE 10 MEQ/50ML IV SOLN
10.0000 meq | INTRAVENOUS | Status: DC
  Administered 2022-01-30: 10 meq via INTRAVENOUS
  Filled 2022-01-30 (×4): qty 50

## 2022-01-30 MED ORDER — WARFARIN - PHARMACIST DOSING INPATIENT: Freq: Every day | Status: DC

## 2022-01-30 MED ORDER — POTASSIUM CHLORIDE CRYS ER 20 MEQ PO TBCR: 40.0000 meq | EXTENDED_RELEASE_TABLET | ORAL | Status: DC

## 2022-01-30 MED ORDER — ORAL CARE MOUTH RINSE: 15.0000 mL | OROMUCOSAL | Status: DC | PRN

## 2022-01-30 MED ORDER — ALPRAZOLAM 0.5 MG PO TABS
0.5000 mg | ORAL_TABLET | Freq: Every day | ORAL | Status: DC
  Administered 2022-01-30 – 2022-02-03 (×5): 0.5 mg via ORAL
  Filled 2022-01-30 (×5): qty 1

## 2022-01-30 MED ORDER — ACETAMINOPHEN 160 MG/5ML PO SOLN: 650.0000 mg | ORAL | Status: DC | PRN

## 2022-01-30 MED ORDER — LORATADINE 10 MG PO TABS
10.0000 mg | ORAL_TABLET | Freq: Every day | ORAL | Status: DC
  Administered 2022-01-31 – 2022-02-04 (×5): 10 mg via ORAL
  Filled 2022-01-30 (×5): qty 1

## 2022-01-30 MED ORDER — WARFARIN SODIUM 5 MG PO TABS
5.0000 mg | ORAL_TABLET | Freq: Once | ORAL | Status: AC
Start: 2022-01-30 — End: 2022-01-30
  Administered 2022-01-30: 5 mg via ORAL
  Filled 2022-01-30: qty 1

## 2022-01-30 MED ORDER — SACUBITRIL-VALSARTAN 24-26 MG PO TABS
1.0000 | ORAL_TABLET | Freq: Two times a day (BID) | ORAL | Status: DC
  Administered 2022-01-30 – 2022-02-04 (×11): 1 via ORAL
  Filled 2022-01-30 (×11): qty 1

## 2022-01-30 MED ORDER — ROSUVASTATIN CALCIUM 20 MG PO TABS
20.0000 mg | ORAL_TABLET | Freq: Every day | ORAL | Status: DC
  Administered 2022-01-31 – 2022-02-04 (×5): 20 mg via ORAL
  Filled 2022-01-30 (×5): qty 1

## 2022-01-30 MED ORDER — ENOXAPARIN SODIUM 60 MG/0.6ML IJ SOSY
60.0000 mg | PREFILLED_SYRINGE | Freq: Two times a day (BID) | INTRAMUSCULAR | Status: DC
  Administered 2022-01-30 – 2022-02-04 (×10): 60 mg via SUBCUTANEOUS
  Filled 2022-01-30 (×10): qty 0.6

## 2022-01-30 MED ORDER — ACETAMINOPHEN 325 MG PO TABS
650.0000 mg | ORAL_TABLET | ORAL | Status: DC | PRN
  Administered 2022-02-02 – 2022-02-04 (×3): 650 mg via ORAL
  Filled 2022-01-30 (×3): qty 2

## 2022-01-30 MED ORDER — DOCUSATE SODIUM 50 MG/5ML PO LIQD
100.0000 mg | Freq: Two times a day (BID) | ORAL | Status: DC
  Administered 2022-01-30 – 2022-02-04 (×10): 100 mg via ORAL
  Filled 2022-01-30 (×10): qty 10

## 2022-01-30 MED ORDER — ASPIRIN 81 MG PO TBEC
81.0000 mg | DELAYED_RELEASE_TABLET | Freq: Every day | ORAL | Status: DC
Start: 2022-01-30 — End: 2022-02-04
  Administered 2022-01-30 – 2022-02-04 (×6): 81 mg via ORAL
  Filled 2022-01-30 (×6): qty 1

## 2022-01-30 MED ORDER — PREGABALIN 25 MG PO CAPS
25.0000 mg | ORAL_CAPSULE | Freq: Two times a day (BID) | ORAL | Status: DC
  Administered 2022-01-30 – 2022-02-04 (×10): 25 mg via ORAL
  Filled 2022-01-30 (×10): qty 1

## 2022-01-30 MED ORDER — ACETAMINOPHEN 650 MG RE SUPP: 650.0000 mg | RECTAL | Status: DC | PRN

## 2022-01-30 NOTE — Progress Notes (Signed)
ANTICOAGULATION CONSULT NOTE  Pharmacy Consult for Enoxaparin and Warfarin Indication: LV thrombus  Allergies  Allergen Reactions   Doxycycline Nausea Only    malaise    Erythromycin Nausea And Vomiting   Iodine     unknown   Latex Other (See Comments)    Irritation of the skin   Shellfish Allergy Other (See Comments)    GI- Facial  Acne   Strawberry Extract Other (See Comments)    Mouth ulcers    Vancomycin    Vicodin [Hydrocodone-Acetaminophen] Other (See Comments)    Restless, tolerate percocet   Dilaudid [Hydromorphone Hcl] Rash    *Pt states she can take if given benadryl prior*   Keflex [Cephalexin] Rash   Penicillins Rash    Hives   Sulfa Antibiotics Rash    Hives    Patient Measurements: Height: 5\' 1"  (154.9 cm) Weight: 59 kg (130 lb 1.1 oz) IBW/kg (Calculated) : 47.8  Vital Signs: Temp: 98.2 F (36.8 C) (09/07 0800) Temp Source: Oral (09/07 0400) BP: 148/87 (09/07 1200) Pulse Rate: 75 (09/07 1000)  Labs: Recent Labs    01/28/22 1330 01/28/22 1337 01/28/22 1704 01/28/22 1747 01/29/22 0300 01/29/22 0845 01/29/22 1025 01/29/22 1745 01/30/22 0143 01/30/22 1000  HGB 12.0 11.9*  --  10.2* 11.2*  --   --   --  9.2*  --   HCT 35.5* 35.0*  --  30.0* 33.3*  --   --   --  27.3*  --   PLT 171  --   --   --  173  --   --   --  143*  --   APTT 24  --   --   --  41*  --    < > 58* 77* 72*  LABPROT 15.0  --   --   --   --   --   --   --   --   --   INR 1.2  --   --   --   --   --   --   --   --   --   HEPARINUNFRC  --   --   --   --   --   --   --  0.66 0.60 0.52  CREATININE 0.84 0.70  --   --   --  0.56  --   --  0.65  --   TROPONINIHS  --   --  69*  --  41*  --   --   --   --   --    < > = values in this interval not displayed.     Estimated Creatinine Clearance: 58.7 mL/min (by C-G formula based on SCr of 0.65 mg/dL).   Medical History: 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/16/2013    L3-4/notes 11/24/2013, arms, neck   Exertional asthma    GERD (gastroesophageal reflux disease)    Hypertension    Kidney stones    "have always passed them"   Neck pain    Osteomyelitis (HCC) 11/23/2013   osteomyelitis, discitis    Sleep concern    uses Prozac for sleep     Medications:  Medications Prior to Admission  Medication Sig Dispense Refill Last Dose   ALPRAZolam (XANAX) 0.5 MG tablet Take 0.5 mg by mouth at bedtime.   01/27/2022   apixaban (ELIQUIS) 5 MG TABS tablet Take  1 tablet (5 mg total) by mouth 2 (two) times daily. 60 tablet 1 01/28/2022 at 0800   cyclobenzaprine (FLEXERIL) 10 MG tablet Take 1 tablet (10 mg total) by mouth 3 (three) times daily as needed for muscle spasms. 90 tablet 5 01/27/2022   guaiFENesin (MUCINEX) 600 MG 12 hr tablet Take 600 mg by mouth daily as needed for cough or to loosen phlegm.   Past Month   loratadine (CLARITIN) 10 MG tablet Take 10 mg by mouth every morning.   01/28/2022   MYRBETRIQ 25 MG TB24 tablet Take 25 mg by mouth daily.   01/28/2022   pregabalin (LYRICA) 25 MG capsule Take 1 capsule (25 mg total) by mouth 2 (two) times daily. 60 capsule 0 01/28/2022   rosuvastatin (CRESTOR) 20 MG tablet Take 1 tablet (20 mg total) by mouth daily. 30 tablet 1 01/28/2022   sacubitril-valsartan (ENTRESTO) 24-26 MG Take 1 tablet by mouth 2 (two) times daily. 60 tablet 1 01/28/2022   Scheduled:   ALPRAZolam  0.5 mg Oral QHS   Chlorhexidine Gluconate Cloth  6 each Topical Daily   docusate  100 mg Oral BID   enoxaparin (LOVENOX) injection  60 mg Subcutaneous Q12H   [START ON 01/31/2022] loratadine  10 mg Oral Daily   [START ON 01/31/2022] polyethylene glycol  17 g Oral Daily   pregabalin  25 mg Oral BID   [START ON 01/31/2022] rosuvastatin  20 mg Oral Daily   sacubitril-valsartan  1 tablet Oral BID   sodium chloride flush  10-40 mL Intracatheter Q12H   warfarin  5 mg Oral ONCE-1600   Warfarin - Pharmacist Dosing Inpatient   Does not apply q1600    Assessment: 88 yoF  with admitted for stroke s/p thrombectomy on 9/5. 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 Last Eliquis dose 9/5 @0800 .  Plan to transition from IV heparin to warfarin while bridging with enoxaparin SQ. Pharmacy consulted to assist with dosing.  aPTT this morning on heparin 900 units/hr therapeutic at 77 and 72. CBC stable with no signs of bleeding. CrCl stable at 50-60 mL/min.  Goal of Therapy:  INR 2-3 Anti-Xa level 0.6-1 units/ml 4hrs after LMWH dose given Monitor platelets by anticoagulation protocol: Yes   Plan:  Stop heparin at 1600 Start enoxaparin 60mg  SQ BID at 1600 Warfarin 5mg  PO once Check daily CBC and INR Check anti-Xa level once at steady state (after 3rd or 4th dose)  , PharmD Clinical Pharmacist 01/30/2022, 1:33 PM

## 2022-01-30 NOTE — Progress Notes (Signed)
NAME:  Chloe Johnson, MRN:  [id number], DOB:  09-13-1955, LOS: 2 ADMISSION DATE:  01/28/2022, CONSULTATION DATE:  9/5 REFERRING MD:  Dr. Iver Nestle, CHIEF COMPLAINT:  L MCA stroke s/p thrombectomy   History of Present Illness:  Patient is a 66 year old female with pertinent PMH of recent L MCA stroke and LV thrombus, HTN, HLD, hepatitis presents to [facility] ED on 9/5 with bilateral weakness.  Patient recently admitted to [facility] [place] on 8/29 with L MCA m2-m3 occlusion stroke.  Patient received mechanical thrombectomy.  Echo showing LVEF 45 to 50% and LV thrombus measuring 1.84 x 1.2 cm.  Patient also with elevated troponins and was started on heparin.  Patient discharged on 8/31.  On 9/2 patient began experiencing symptoms of dizziness that was followed by nausea/vomiting.  Her symptoms seem to have improved but then on 9/5 patient stated she felt generally weak.  Around 9 AM she was having trouble finding words.  Husband states she had right facial droop.  Husband also states that she dropped something and tried to catch it and lost her balance in both her legs and when getting up is weaker on the right side.  Patient states she has not missed any doses of her Eliquis.  Patient came to [facility] ED for further work-up.  On arrival to [facility] ED on 9/5 as a code stroke.  CT head without acute abnormality.  Was unable to get CTA of head due to IV infiltration.  Neuro following. Neuro exam does show bilateral weakness and R facial droop.  Patient taken directly to IR for thrombectomy. Patient will go to ICU post thrombectomy. PCCM consulted to follow for medical management.  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/16/2013   L3-4/notes 11/24/2013, arms, neck   Exertional asthma    GERD (gastroesophageal reflux disease)    Hypertension    Kidney stones    "have always passed them"   Neck pain    Osteomyelitis (HCC) 11/23/2013   osteomyelitis,  discitis    Sleep concern    uses Prozac for sleep      Significant Hospital Events: Including procedures, antibiotic start and stop dates in addition to other pertinent events   8/29 L MCA stroke s/p thrombectomy; LV thrombus started on eliquis 8/31 discharged 9/5 L MCA stroke s/p thrombectomy; post op intubated  Interim History / Subjective:  Patient was successfully extubated yesterday Remain aphasic  Objective   Blood pressure (!) 148/87, pulse 75, temperature 98.2 F (36.8 C), resp. rate 18, height 5\' 1"  (1.549 m), weight 59 kg, SpO2 99 %.        Intake/Output Summary (Last 24 hours) at 01/30/2022 1337 Last data filed at 01/30/2022 0900 Gross per 24 hour  Intake 1199.38 ml  Output 750 ml  Net 449.38 ml   Filed Weights   01/28/22 1332  Weight: 59 kg    Examination:  Physical exam: General: Acutely ill-appearing female, lying on the bed HEENT: [place]/AT, eyes anicteric.  moist mucus membranes Neuro: Alert, awake following commands.  Right facial droop, right upper extremity plegia and motor aphasia Chest: Coarse breath sounds, no wheezes or rhonchi Heart: Regular rate and rhythm, no murmurs or gallops Abdomen: Soft, nontender, nondistended, bowel sounds present Skin: No rash  Resolved Hospital Problem list     Assessment & Plan:  Acute left MCA stroke stroke s/p mechanical thrombectomy Recent left  MCA stroke s/p thrombectomy on 8/29 Patient presented with right-sided weakness and right facial droop IR angio showing occluded L MCA M2 region; TICI 2c revascularization  Continue neuro watch every hour Stroke team is following MRI brain confirmed punctate left MCA territory stroke Remains severely aphasic Continue secondary stroke prophylaxis Continue statin and IV heparin infusion  Left ventricular thrombus with recent NSTEMI Chronic HFrEF Recent echocardiogram showed EF 45-50% with apical LV thrombus Continue IV heparin infusion Patient's husband she is  compliant with Eliquis at home It suggestive of patient had Eliquis failure  Postprocedure respiratory insufficiency Patient was successfully extubated yesterday, remains on room air  HTN HLD Continue statin Off antihypertensive for now, resume prior to discharge  Lumbar stenosis Continue fentanyl as needed  Best Practice (right click and "Reselect all SmartList Selections" daily)   Diet/type: Speech and swallow evaluation is pending DVT prophylaxis: Heparin infusion GI prophylaxis: PPI Lines: N/A Foley:  N/A Code Status:  full code Last date of multidisciplinary goals of care discussion: 9/7: Updated patient's husband and her daughter at bedside  Labs   CBC: Recent Labs  Lab 01/26/22 1118 01/28/22 1330 01/28/22 1337 01/28/22 1747 01/29/22 0300 01/30/22 0143  WBC 5.4 4.7  --   --  8.6 5.3  NEUTROABS  --  2.5  --   --  6.1  --   HGB 13.8 12.0 11.9* 10.2* 11.2* 9.2*  HCT 44.1 35.5* 35.0* 30.0* 33.3* 27.3*  MCV 93.2 88.5  --   --  87.6 86.4  PLT 170 171  --   --  173 143*    Basic Metabolic Panel: Recent Labs  Lab 01/26/22 1557 01/28/22 1330 01/28/22 1337 01/28/22 1747 01/29/22 0845 01/30/22 0143  NA 142 141 138 142 138 139  K 3.3* 3.8 4.0 3.7 3.5 2.9*  CL 109 109 109  --  110 112*  CO2 22 23  --   --  20* 21*  GLUCOSE 95 115* 110*  --  148* 100*  BUN 9 8 10   --  6* <5*  CREATININE 0.61 0.84 0.70  --  0.56 0.65  CALCIUM 9.6 9.4  --   --  8.8* 8.3*   GFR: Estimated Creatinine Clearance: 58.7 mL/min (by C-G formula based on SCr of 0.65 mg/dL). Recent Labs  Lab 01/26/22 1118 01/28/22 1330 01/29/22 0300 01/30/22 0143  WBC 5.4 4.7 8.6 5.3    Liver Function Tests: Recent Labs  Lab 01/28/22 1330  AST 22  ALT 18  ALKPHOS 45  BILITOT 0.3  PROT 6.4*  ALBUMIN 3.9   No results for input(s): "LIPASE", "AMYLASE" in the last 168 hours. No results for input(s): "AMMONIA" in the last 168 hours.  ABG    Component Value Date/Time   PHART 7.330 (L)  01/28/2022 1747   PCO2ART 44.7 01/28/2022 1747   PO2ART 495 (H) 01/28/2022 1747   HCO3 23.7 01/28/2022 1747   TCO2 25 01/28/2022 1747   ACIDBASEDEF 2.0 01/28/2022 1747   O2SAT 100 01/28/2022 1747     Coagulation Profile: Recent Labs  Lab 01/28/22 1330  INR 1.2    Cardiac Enzymes: No results for input(s): "CKTOTAL", "CKMB", "CKMBINDEX", "TROPONINI" in the last 168 hours.  HbA1C: Hgb A1c MFr Bld  Date/Time Value Ref Range Status  01/22/2022 03:51 AM 5.0 4.8 - 5.6 % Final    Comment:    (NOTE) Pre diabetes:          5.7%-6.4%  Diabetes:              >  6.4%  Glycemic control for   <7.0% adults with diabetes     CBG: Recent Labs  Lab 01/28/22 1328 01/28/22 1747  GLUCAP 125* 103*     Cheri Fowler, MD [place] [facility] See Amion for pager If no response to pager, please call [phone] until 7pm After 7pm, Please call E-link [phone]

## 2022-01-30 NOTE — Consult Note (Signed)
CARDIOLOGY CONSULT NOTE       Patient ID: Chloe Johnson MRN: [id number] DOB/AGE: 1955/06/23 66 y.o.  Admit date: 01/28/2022 Referring Physician: Leonie Man Primary Physician: Charlane Ferretti, MD Primary Cardiologist: Nahser Reason for Consultation: Stroke/Takatsubo/Anticoagulation  Principal Problem:   Stroke determined by clinical assessment [name]) Active Problems:   H/O ischemic left MCA stroke   Acute respiratory failure ([place])   Middle cerebral artery embolism, left   HPI:  66 y.o. 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 NSR no PAF Repeat TTE pending Currently on heparin MRI no bleed and new acute infarcts in left posterior frontal and anterior parietal cortex. Currently hemodynamically stable Getting swallowing study with plans to go to rehab.   ROS All other systems reviewed and negative except as noted above  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/16/2013   L3-4/notes 11/24/2013, arms, neck   Exertional asthma    GERD (gastroesophageal reflux disease)    Hypertension    Kidney stones    "have always passed them"   Neck pain    Osteomyelitis ([place]) 11/23/2013   osteomyelitis, discitis    Sleep concern    uses Prozac for sleep     Family History  Problem Relation Age of Onset   Other Mother    Other Father    Hypertension Father     Social History   Socioeconomic History   Marital status: Married    Spouse name: Juanda Crumble   Number of children: 3   Years of education: RN   Highest education level: Not on file  Occupational History    Employer: [employer]   Tobacco Use   Smoking status:  Never   Smokeless tobacco: Never  Substance and Sexual Activity   Alcohol use: Yes    Alcohol/week: 6.0 standard drinks of alcohol    Types: 3 Glasses of wine, 3 Shots of liquor per week   Drug use: No   Sexual activity: Yes    Birth control/protection: Post-menopausal  Other Topics Concern   Not on file  Social History Narrative   Patient is married and lives at home with her husband Juanda Crumble).  Patient works full time as a Therapist, sports.   Right handed.   College education.   Social Determinants of Health   Financial Resource Strain: Not on file  Food Insecurity: Not on file  Transportation Needs: Not on file  Physical Activity: Not on file  Stress: Not on file  Social Connections: Not on file  Intimate Partner Violence: Not on file    Past Surgical History:  Procedure Laterality Date   BREAST CYST EXCISION Left 2010   "polypectomy"   IR CT HEAD LTD  01/21/2022   IR CT HEAD LTD  01/28/2022   IR PERCUTANEOUS ART THROMBECTOMY/INFUSION INTRACRANIAL INC DIAG ANGIO  01/21/2022   IR PERCUTANEOUS ART THROMBECTOMY/INFUSION INTRACRANIAL INC DIAG ANGIO  01/28/2022   IR US GUIDE VASC ACCESS RIGHT  01/22/2022   PICC LINE PLACE PERIPHERAL ([place] HX) Right    for use of Levaquin & Vancomycin, of note: she reports that she had a "  flulike feeling"    RADIOLOGY WITH ANESTHESIA N/A 01/21/2022   Procedure: IR WITH ANESTHESIA;  Surgeon: Luanne Bras, MD;  Location: [place];  Service: Radiology;  Laterality: N/A;   RADIOLOGY WITH ANESTHESIA N/A 01/28/2022   Procedure: IR WITH ANESTHESIA;  Surgeon: Radiologist, Medication, MD;  Location: [place];  Service: Radiology;  Laterality: N/A;   TONSILLECTOMY AND ADENOIDECTOMY  1975   TUBAL LIGATION  1999      Current Facility-Administered Medications:    acetaminophen (TYLENOL) tablet 650 mg, 650 mg, Oral, Q4H PRN **OR** acetaminophen (TYLENOL) 160 MG/5ML solution 650 mg, 650 mg, Per Tube, Q4H PRN **OR** acetaminophen (TYLENOL) suppository 650 mg, 650 mg, Rectal, Q4H  PRN, Bhagat, Srishti L, MD   ALPRAZolam (XANAX) tablet 0.5 mg, 0.5 mg, Per Tube, QHS, Chand, Currie Paris, MD   Chlorhexidine Gluconate Cloth 2 % PADS 6 each, 6 each, Topical, Daily, Bhagat, Srishti L, MD, 6 each at 01/29/22 1400   docusate (COLACE) 50 MG/5ML liquid 100 mg, 100 mg, Per Tube, BID, Andres Labrum D, PA-C, 100 mg at 01/28/22 2256   fentaNYL (SUBLIMAZE) injection 50 mcg, 50 mcg, Intravenous, Q3H PRN, Jacky Kindle, MD   heparin ADULT infusion 100 units/mL (25000 units/218m), 900 Units/hr, Intravenous, Continuous, XRosalin Hawking MD, Last Rate: 9 mL/hr at 01/30/22 0800, 900 Units/hr at 01/30/22 0800   hydrALAZINE (APRESOLINE) injection 10-40 mg, 10-40 mg, Intravenous, Q4H PRN, PRollene Rotunda John D, PA-C   loratadine (CLARITIN) tablet 10 mg, 10 mg, Per Tube, Daily, CJacky Kindle MD   Oral care mouth rinse, 15 mL, Mouth Rinse, PRN, XRosalin Hawking MD   polyethylene glycol (MIRALAX / GLYCOLAX) packet 17 g, 17 g, Per Tube, Daily, PRollene Rotunda John D, PA-C   potassium chloride 10 mEq in 50 mL *CENTRAL LINE* IVPB, 10 mEq, Intravenous, Q1 Hr x 4, Chand, Sudham, MD, Last Rate: 50 mL/hr at 01/30/22 1004, 10 mEq at 01/30/22 1004   pregabalin (LYRICA) capsule 25 mg, 25 mg, Per Tube, BID, CJacky Kindle MD   rosuvastatin (CRESTOR) tablet 20 mg, 20 mg, Per Tube, Daily, PRollene Rotunda John D, PA-C   sacubitril-valsartan (ENTRESTO) 24-26 mg per tablet, 1 tablet, Per Tube, BID, Chand, Sudham, MD   sodium chloride flush (NS) 0.9 % injection 10-40 mL, 10-40 mL, Intracatheter, Q12H, Bhagat, Srishti L, MD, 20 mL at 01/30/22 1010   sodium chloride flush (NS) 0.9 % injection 10-40 mL, 10-40 mL, Intracatheter, PRN, Bhagat, Srishti L, MD  ALPRAZolam  0.5 mg Per Tube QHS   Chlorhexidine Gluconate Cloth  6 each Topical Daily   docusate  100 mg Per Tube BID   loratadine  10 mg Per Tube Daily   polyethylene glycol  17 g Per Tube Daily   pregabalin  25 mg Per Tube BID   rosuvastatin  20 mg Per Tube Daily   sacubitril-valsartan  1 tablet Per  Tube BID   sodium chloride flush  10-40 mL Intracatheter Q12H    heparin 900 Units/hr (01/30/22 0800)   potassium chloride 10 mEq (01/30/22 1004)    Physical Exam: Blood pressure (!) 146/84, pulse 75, temperature 98.2 F (36.8 C), resp. rate 18, height _0  (1.549 m), weight 59 kg, SpO2 99 %.    No distress Right hemiparesis Left facial droop No murmur  Lungs clear Abdomen benign No edema  Labs:   Lab Results  Component Value Date   WBC 5.3 01/30/2022   HGB 9.2 (L) 01/30/2022   HCT 27.3 (L) 01/30/2022   MCV 86.4 01/30/2022   PLT 143 (  L) 01/30/2022    Recent Labs  Lab 01/28/22 1330 01/28/22 1337 01/30/22 0143  NA 141   < > 139  K 3.8   < > 2.9*  CL 109   < > 112*  CO2 23   < > 21*  BUN 8   < > <5*  CREATININE 0.84   < > 0.65  CALCIUM 9.4   < > 8.3*  PROT 6.4*  --   --   BILITOT 0.3  --   --   ALKPHOS 45  --   --   ALT 18  --   --   AST 22  --   --   GLUCOSE 115*   < > 100*   < > = values in this interval not displayed.   Lab Results  Component Value Date   CKTOTAL 49 01/18/2014    Lab Results  Component Value Date   CHOL 223 (H) 01/22/2022   Lab Results  Component Value Date   HDL 40 (L) 01/22/2022   Lab Results  Component Value Date   LDLCALC 150 (H) 01/22/2022   Lab Results  Component Value Date   TRIG 271 (H) 01/29/2022   TRIG 164 (H) 01/22/2022   Lab Results  Component Value Date   CHOLHDL 5.6 01/22/2022   No results found for: "LDLDIRECT"    Radiology: IR PERCUTANEOUS ART THROMBECTOMY/INFUSION INTRACRANIAL INC DIAG ANGIO  Result Date: 01/30/2022 INDICATION: 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 EXAM: 1. EMERGENT LARGE VESSEL OCCLUSION THROMBOLYSIS (anterior CIRCULATION) COMPARISON:  None Available. MEDICATIONS: No antibiotic was administered within 1 hour of the procedure. ANESTHESIA/SEDATION: General anesthesia. CONTRAST:  Omnipaque 300 150 mL. FLUOROSCOPY TIME:  Fluoroscopy Time: 39 minutes 12 seconds (1289 mGy).  COMPLICATIONS: None immediate. TECHNIQUE: Following a full explanation of the procedure along with the potential associated complications, an informed witnessed consent was obtained. The risks of intracranial hemorrhage of 10%, worsening neurological deficit, ventilator dependency, death and inability to revascularize were all reviewed in detail with the patient's spouse. The patient was then put under general anesthesia by the Department of Anesthesiology at [facility]. The left groin was prepped and draped in the usual sterile fashion. Thereafter using modified Seldinger technique, transfemoral access into the left common femoral artery was obtained without difficulty. Over a 0.035 inch guidewire an 8 French 25 cm Pinnacle sheath was inserted. Through this, and also over a 0.035 inch guidewire a combination of a wrist Simmons 2 support catheter inside of an 087 balloon guide catheter was advanced to the aortic region, and selectively positioned initially in the left common carotid artery and then advanced to the distal cervical left ICA over an 035 inch guidewire with support catheter. The guidewire was removed. Good aspiration obtained hub of the balloon guide catheter. Arteriogram was then performed proximally from the common carotid artery then the distal left internal carotid artery. FINDINGS: The left common carotid arteriogram demonstrates the left external carotid artery and its major branches to be widely patent. The left internal carotid artery at bulb to the cranial skull base demonstrates wide patency. Wide patency is noted of the petrous, the cavernous and the supraclinoid segments. Opacification of the left posterior cerebral artery via the left posterior communicating artery is noted. The left middle cerebral artery M1 segment is widely patent. The angiographic occlusion of the superior division mid M2 segment is noted. The left MCA inferior division opacifies into the capillary and venous  phases. The left anterior cerebral  artery opacifies into the capillary and venous phases. Transient cross-filling via the anterior communicating artery of the right anterior cerebral artery A2 segment and the A1 segment is evident. PROCEDURE: Over an Aristotle softip micro guidewire with a J configuration, a combination of a 132 cm Zoom aspiration catheter inside of which was a 160 cm Phenom microcatheter was advanced to the proximal left middle cerebral artery. Using a torque device, access was obtained with the micro guidewire through the occluded superior division into the proximal M3 segment followed by the microcatheter. The guidewire was removed. Good aspiration was obtained from the hub of the microcatheter. A gentle contrast injection demonstrated slow antegrade flow of contrast distally. A 3 mm x 36 mm retrieval device was then advanced to the distal end of the microcatheter and deployed in the usual manner. At this time the Zoom aspiration catheter was advanced into the proximal superior division. With proximal flow arrest in the left internal carotid artery constant aspiration was applied at the hub of the Zoom aspiration catheter and the aspiration device 2 minutes of the balloon guide catheter with a 20 mL syringe. Combination of the retrieval, the catheter and the Zoom aspiration catheter was retrieved removed. Following reversal of flow arrest, control arteriogram with the balloon guide catheter in the left internal carotid artery demonstrated near complete opacification of the superior division with gradual improved flow into the M3 M4 segment of the superior division. Moderate spasm noted in the proximal superior division responded to 5 aliquotes of 25 mcg of nitroglycerin intra-arterially. Also noted was a small non flow limiting filling defect in a mid parietal branch of the inferior division in the distal M3 M4 region which also gradually started clearing over a period of 20 minutes. Control  arteriogram performed through the balloon guide catheter in the mid cervical left demonstrated near complete revascularization of the left MCA achieving a TICI 2C revascularization. A control arteriogram performed through the left common carotid artery demonstrated small dissection of the distal left internal carotid artery at the skull base medially without any flow limitation. The balloon guide catheter was removed. Diagnostic catheter arteriogram was then performed of the right common carotid artery and the right vertebral artery. These demonstrated the right external carotid artery and its major branches to be widely patent. Internal carotid artery at the bulb to the cranial skull base demonstrates wide patency. The petrous, the cavernous and the supraclinoid segments demonstrate wide patency as do the right middle cerebral artery and the right anterior cerebral artery into the capillary and venous phases. The right vertebral artery demonstrates wide patency. More distally, patency is maintained of the basilar junction in the right posterior-inferior cerebellar artery. The basilar artery, the posterior cerebral arteries, the superior cerebellar arteries and the anterior-inferior cerebellar arteries demonstrate patency into the capillary and venous phases. Unopacified blood was noted in the basilar artery from the contralateral left vertebral artery. An 8 French Angio-Seal closure device demonstrates hemostasis at the left groin puncture site. Distal pulses remained palpable in both feet unchanged. A CT of the brain demonstrated no hemorrhagic complications. The patient was left intubated due to poor responsiveness following reversal of the anesthesia. Patient was then transferred to the neuro ICU post revascularization care. IMPRESSION: Status post endovascular revascularization of occluded division of the left middle cerebral artery achieving a TICI 2C revascularization of the left MCA distribution. Puncture to  first pass approximately 36 mins. PLAN: Follow-up as per referring MD. Electronically Signed   By: Luanne Bras  M.D.   On: 01/30/2022 08:42   IR [place]  Result Date: 01/30/2022 INDICATION: 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 EXAM: 1. EMERGENT LARGE VESSEL OCCLUSION THROMBOLYSIS (anterior CIRCULATION) COMPARISON:  None Available. MEDICATIONS: No antibiotic was administered within 1 hour of the procedure. ANESTHESIA/SEDATION: General anesthesia. CONTRAST:  Omnipaque 300 150 mL. FLUOROSCOPY TIME:  Fluoroscopy Time: 39 minutes 12 seconds (1289 mGy). COMPLICATIONS: None immediate. TECHNIQUE: Following a full explanation of the procedure along with the potential associated complications, an informed witnessed consent was obtained. The risks of intracranial hemorrhage of 10%, worsening neurological deficit, ventilator dependency, death and inability to revascularize were all reviewed in detail with the patient's spouse. The patient was then put under general anesthesia by the Department of Anesthesiology at [facility]. The left groin was prepped and draped in the usual sterile fashion. Thereafter using modified Seldinger technique, transfemoral access into the left common femoral artery was obtained without difficulty. Over a 0.035 inch guidewire an 8 French 25 cm Pinnacle sheath was inserted. Through this, and also over a 0.035 inch guidewire a combination of a wrist Simmons 2 support catheter inside of an 087 balloon guide catheter was advanced to the aortic region, and selectively positioned initially in the left common carotid artery and then advanced to the distal cervical left ICA over an 035 inch guidewire with support catheter. The guidewire was removed. Good aspiration obtained hub of the balloon guide catheter. Arteriogram was then performed proximally from the common carotid artery then the distal left internal carotid artery. FINDINGS: The left common carotid arteriogram  demonstrates the left external carotid artery and its major branches to be widely patent. The left internal carotid artery at bulb to the cranial skull base demonstrates wide patency. Wide patency is noted of the petrous, the cavernous and the supraclinoid segments. Opacification of the left posterior cerebral artery via the left posterior communicating artery is noted. The left middle cerebral artery M1 segment is widely patent. The angiographic occlusion of the superior division mid M2 segment is noted. The left MCA inferior division opacifies into the capillary and venous phases. The left anterior cerebral artery opacifies into the capillary and venous phases. Transient cross-filling via the anterior communicating artery of the right anterior cerebral artery A2 segment and the A1 segment is evident. PROCEDURE: Over an Aristotle softip micro guidewire with a J configuration, a combination of a 132 cm Zoom aspiration catheter inside of which was a 160 cm Phenom microcatheter was advanced to the proximal left middle cerebral artery. Using a torque device, access was obtained with the micro guidewire through the occluded superior division into the proximal M3 segment followed by the microcatheter. The guidewire was removed. Good aspiration was obtained from the hub of the microcatheter. A gentle contrast injection demonstrated slow antegrade flow of contrast distally. A 3 mm x 36 mm retrieval device was then advanced to the distal end of the microcatheter and deployed in the usual manner. At this time the Zoom aspiration catheter was advanced into the proximal superior division. With proximal flow arrest in the left internal carotid artery constant aspiration was applied at the hub of the Zoom aspiration catheter and the aspiration device 2 minutes of the balloon guide catheter with a 20 mL syringe. Combination of the retrieval, the catheter and the Zoom aspiration catheter was retrieved removed. Following reversal  of flow arrest, control arteriogram with the balloon guide catheter in the left internal carotid artery demonstrated near complete opacification of the  superior division with gradual improved flow into the M3 M4 segment of the superior division. Moderate spasm noted in the proximal superior division responded to 5 aliquotes of 25 mcg of nitroglycerin intra-arterially. Also noted was a small non flow limiting filling defect in a mid parietal branch of the inferior division in the distal M3 M4 region which also gradually started clearing over a period of 20 minutes. Control arteriogram performed through the balloon guide catheter in the mid cervical left demonstrated near complete revascularization of the left MCA achieving a TICI 2C revascularization. A control arteriogram performed through the left common carotid artery demonstrated small dissection of the distal left internal carotid artery at the skull base medially without any flow limitation. The balloon guide catheter was removed. Diagnostic catheter arteriogram was then performed of the right common carotid artery and the right vertebral artery. These demonstrated the right external carotid artery and its major branches to be widely patent. Internal carotid artery at the bulb to the cranial skull base demonstrates wide patency. The petrous, the cavernous and the supraclinoid segments demonstrate wide patency as do the right middle cerebral artery and the right anterior cerebral artery into the capillary and venous phases. The right vertebral artery demonstrates wide patency. More distally, patency is maintained of the basilar junction in the right posterior-inferior cerebellar artery. The basilar artery, the posterior cerebral arteries, the superior cerebellar arteries and the anterior-inferior cerebellar arteries demonstrate patency into the capillary and venous phases. Unopacified blood was noted in the basilar artery from the contralateral left vertebral  artery. An 8 French Angio-Seal closure device demonstrates hemostasis at the left groin puncture site. Distal pulses remained palpable in both feet unchanged. A CT of the brain demonstrated no hemorrhagic complications. The patient was left intubated due to poor responsiveness following reversal of the anesthesia. Patient was then transferred to the neuro ICU post revascularization care. IMPRESSION: Status post endovascular revascularization of occluded division of the left middle cerebral artery achieving a TICI 2C revascularization of the left MCA distribution. Puncture to first pass approximately 36 mins. PLAN: Follow-up as per referring MD. Electronically Signed   By: Luanne Bras M.D.   On: 01/30/2022 08:42   MR BRAIN WO CONTRAST  Result Date: 01/28/2022 CLINICAL DATA:  Right MCA stroke status post thrombectomy, follow-up EXAM: MRI HEAD WITHOUT CONTRAST MRA HEAD WITHOUT CONTRAST TECHNIQUE: Multiplanar, multi-echo pulse sequences of the brain and surrounding structures were acquired without intravenous contrast. Angiographic images of the Circle of Willis were acquired using MRA technique without intravenous contrast. COMPARISON:  01/22/2022 MRI head, no prior MRA, correlation is made with CTA head and neck 01/21/2022 FINDINGS: Brain: New foci of restricted diffusion with ADC correlate in the left posterior frontal and anterior parietal cortex (series 5, images 80 8-95). Redemonstrated focus of restricted diffusion with ADC correlate in the left insula (series 5, image 76 and series 6, image 26). Other previously noted foci of restricted diffusion are decreased in conspicuity and do not have persistent ADC correlates. No acute hemorrhage, mass, mass effect, or midline shift. No hydrocephalus or extra-axial collection. Punctate focus of hemosiderin deposition in the left posterior frontal lobe. Minimal T2 hyperintense signal in the periventricular Hilgers matter and pons, likely the sequela of mild chronic  small vessel ischemic disease. Vascular: Normal arterial flow voids. Skull and upper cervical spine: Normal marrow signal. Sinuses/Orbits: Minimal mucosal thickening in the ethmoid air cells. The orbits are unremarkable. Other: The mastoids are well aerated. MRA HEAD FINDINGS Anterior circulation: Both internal  carotid arteries are patent to the termini, without significant stenosis. A1 segments patent. Normal anterior communicating artery. Anterior cerebral arteries are patent to their distal aspects. No M1 stenosis or occlusion. Distal MCA branches perfused and symmetric.In particular, the left MCA branches appear perfused and without focal stenosis. Posterior circulation: Vertebral arteries patent to the vertebrobasilar junction without stenosis. Basilar patent to its distal aspect. Superior cerebellar arteries patent bilaterally. Patent P1 segments. PCAs perfused to their distal aspects without stenosis. Diminutive left posterior communicating artery. The right posterior communicating artery is not definitively visualized. Anatomic variants: None significant IMPRESSION: 1. 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 2. No intracranial large vessel occlusion or significant stenosis. In particular, left MCA branches appear perfused and without focal stenosis. These results will be called to the ordering clinician or representative by the Radiologist Assistant, and communication documented in the PACS or [employer]. Electronically Signed   By: Merilyn Baba M.D.   On: 01/28/2022 23:40   MR ANGIO HEAD WO CONTRAST  Result Date: 01/28/2022 CLINICAL DATA:  Right MCA stroke status post thrombectomy, follow-up EXAM: MRI HEAD WITHOUT CONTRAST MRA HEAD WITHOUT CONTRAST TECHNIQUE: Multiplanar, multi-echo pulse sequences of the brain and surrounding structures were acquired without intravenous contrast. Angiographic images of the Circle of Willis were acquired using MRA technique  without intravenous contrast. COMPARISON:  01/22/2022 MRI head, no prior MRA, correlation is made with CTA head and neck 01/21/2022 FINDINGS: Brain: New foci of restricted diffusion with ADC correlate in the left posterior frontal and anterior parietal cortex (series 5, images 80 8-95). Redemonstrated focus of restricted diffusion with ADC correlate in the left insula (series 5, image 76 and series 6, image 26). Other previously noted foci of restricted diffusion are decreased in conspicuity and do not have persistent ADC correlates. No acute hemorrhage, mass, mass effect, or midline shift. No hydrocephalus or extra-axial collection. Punctate focus of hemosiderin deposition in the left posterior frontal lobe. Minimal T2 hyperintense signal in the periventricular Kil matter and pons, likely the sequela of mild chronic small vessel ischemic disease. Vascular: Normal arterial flow voids. Skull and upper cervical spine: Normal marrow signal. Sinuses/Orbits: Minimal mucosal thickening in the ethmoid air cells. The orbits are unremarkable. Other: The mastoids are well aerated. MRA HEAD FINDINGS Anterior circulation: Both internal carotid arteries are patent to the termini, without significant stenosis. A1 segments patent. Normal anterior communicating artery. Anterior cerebral arteries are patent to their distal aspects. No M1 stenosis or occlusion. Distal MCA branches perfused and symmetric.In particular, the left MCA branches appear perfused and without focal stenosis. Posterior circulation: Vertebral arteries patent to the vertebrobasilar junction without stenosis. Basilar patent to its distal aspect. Superior cerebellar arteries patent bilaterally. Patent P1 segments. PCAs perfused to their distal aspects without stenosis. Diminutive left posterior communicating artery. The right posterior communicating artery is not definitively visualized. Anatomic variants: None significant IMPRESSION: 1. 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 2. No intracranial large vessel occlusion or significant stenosis. In particular, left MCA branches appear perfused and without focal stenosis. These results will be called to the ordering clinician or representative by the Radiologist Assistant, and communication documented in the PACS or [employer]. Electronically Signed   By: Merilyn Baba M.D.   On: 01/28/2022 23:40   [place] EKG SITE RITE  Result Date: 01/28/2022 If Site Rite image not attached, placement could not be confirmed due to current cardiac rhythm.  DG Abd 1  View  Result Date: 01/28/2022 CLINICAL DATA:  Evaluate OG tube placement EXAM: ABDOMEN - 1 VIEW COMPARISON:  None Available. FINDINGS: The OG tube terminates in the left upper quadrant, in the region of the stomach. IMPRESSION: The OG tube appears to terminate in the stomach. No other abnormalities. Electronically Signed   By: Dorise Bullion III M.D.   On: 01/28/2022 17:28   Portable Chest x-ray  Result Date: 01/28/2022 CLINICAL DATA:  [id number]. Encounter for ETT placement. EXAM: PORTABLE CHEST 1 VIEW COMPARISON:  Chest x-ray 01/26/2022, CT heart 01/23/2022 FINDINGS: Enteric tube with tip terminating 3 cm above the carina. Slightly more prominent aortic arch and aortic knob likely due to positioning and AP portable technique. Otherwise the heart and mediastinal contours are unchanged normal limits. No focal consolidation. No pulmonary edema. No pleural effusion. No pneumothorax. No acute osseous abnormality. IMPRESSION: 1. Slightly more prominent aortic arch and aortic knob likely due to positioning and AP portable technique. Finding can likely be further evaluated on CT angio head and neck 01/28/2022. 2. No active disease. Electronically Signed   By: Iven Finn M.D.   On: 01/28/2022 17:21   CT HEAD CODE STROKE WO CONTRAST  Result Date: 01/28/2022 CLINICAL DATA:  Code stroke.  Neuro deficit, acute, stroke suspected EXAM: CT  HEAD WITHOUT CONTRAST TECHNIQUE: Contiguous axial images were obtained from the base of the skull through the vertex without intravenous contrast. RADIATION DOSE REDUCTION: This exam was performed according to the departmental dose-optimization program which includes automated exposure control, adjustment of the mA and/or kV according to patient size and/or use of iterative reconstruction technique. COMPARISON:  CT head 01/21/2022. FINDINGS: Brain: No evidence of acute large vascular territory infarction, hemorrhage, hydrocephalus, extra-axial collection or mass lesion/mass effect. Vascular: No hyperdense vessel identified. Skull: No acute fracture. Sinuses/Orbits: Clear sinuses.  No acute orbital findings. Other: No mastoid effusions. ASPECTS [facility] Stroke Program Early CT Score) total score (0-10 with 10 being normal): 10. IMPRESSION: 1. No evidence of acute intracranial abnormality. 2. ASPECTS is 10. Code stroke imaging results were communicated on 01/28/2022 at 1:43 pm to provider Bhagat via secure text paging. Electronically Signed   By: Margaretha Sheffield M.D.   On: 01/28/2022 13:43   DG Chest 2 View  Result Date: 01/26/2022 CLINICAL DATA:  Chest and shoulder pain.  Recent MRI and CVA. EXAM: CHEST - 2 VIEW COMPARISON:  11/21/2021 FINDINGS: The lungs are clear without focal pneumonia, edema, pneumothorax or pleural effusion. The cardiopericardial silhouette is within normal limits for size. The visualized bony structures of the thorax are unremarkable. IMPRESSION: No active cardiopulmonary disease. Electronically Signed   By: Misty Stanley M.D.   On: 01/26/2022 12:03   CT CORONARY MORPH W/CTA COR W/SCORE W/CA W/CM &/OR WO/CM  Addendum Date: 01/23/2022   ADDENDUM REPORT: 01/23/2022 13:25 EXAM: OVER-READ INTERPRETATION  CT CHEST The following report is a limited chest CT over-read performed by radiologist Dr. Lindaann Slough [facility] on 01/23/2022. This over-read does not include  interpretation of cardiac or coronary anatomy or pathology. The coronary CTA interpretation by the cardiologist is attached. COMPARISON:  Radiographs 11/21/2021. Coronary artery calcium score CT 10/04/2020 FINDINGS: Mediastinum/Nodes: No enlarged lymph nodes within the visualized mediastinum. Lungs/Pleura: There is no pleural effusion. The visualized lungs appear clear. Upper abdomen: No significant findings within the visualized upper abdomen. Musculoskeletal/Chest wall: No chest wall mass or suspicious osseous findings within the visualized chest. IMPRESSION: No significant extracardiac findings within the visualized chest. Electronically Signed   By:  Richardean Sale M.D.   On: 01/23/2022 13:25   Result Date: 01/23/2022 HISTORY: Chest pain/anginal equiv, ECGs or troponins abnormal EXAM: Cardiac/Coronary CT TECHNIQUE: The patient was scanned on a [employer]. PROTOCOL: A 100 kV prospective scan was triggered in the descending thoracic aorta at 111 HU's. Axial non-contrast 3 mm slices were carried out through the heart. The data set was analyzed on a dedicated work station and scored using the Agatston method. Gantry rotation speed was 250 msecs and collimation was .6 mm. Heart rate was optimized medically and sl NTG was given. The 3D data set was reconstructed in 5% intervals of the 35-75 % of the R-R cycle. Systolic and diastolic phases were analyzed on a dedicated work station using MPR, MIP and VRT modes. The patient received contrast. FINDINGS: Coronary calcium score: The patient's coronary artery calcium score is 0, which places the patient in the 0 percentile. Coronary arteries: Normal coronary origins.  Right dominance. Right Coronary Artery: Normal caliber vessel, gives rise to small PDA. No significant plaque or stenosis. Left Main Coronary Artery: Normal caliber vessel. No significant plaque or stenosis. Small caliber ramus without significant plaque or stenosis. Left Anterior Descending  Coronary Artery: Normal caliber vessel. Minimal noncalcified plaque with 1-24% stenosis. Gives rise to large first diagonal branch without significant stenosis. Distal LAD wraps apex Left Circumflex Artery: Normal caliber vessel. Very trivial noncalcified plaque without significant stenosis. Gives rise to large first and second, small third OM branches. Aorta: Normal size, 31 mm at the mid ascending aorta (level of the PA bifurcation) measured double oblique. Trivial aortic atherosclerosis. No dissection seen in visualized portions of the aorta. Aortic Valve: No calcifications. Trileaflet. Other findings: Normal pulmonary vein drainage into the left atrium. Normal left atrial appendage without a thrombus. Appendage is chicken wing type. Normal size of the pulmonary artery. Normal appearance of the pericardium. There is an apical LV thrombus, dimensions saves on PACS images. Maximum dimension 15.6 x 11.3 mm There is a small accessory left atrial segment on the cranial aspect of the left atrium. IMPRESSION: 1.  Very minimal nonobstructive CAD, CADRADS = 1. 2. Coronary calcium score of 0. This was 0 percentile for age and sex matched control. 3. Normal coronary origin with right dominance. 4. There is an apical LV thrombus, dimensions saves on PACS images. Maximum dimension 15.6 x 11.3 mm Findings reported to ordering provider. INTERPRETATION: CAD-RADS 1: Minimal non-obstructive CAD (0-24%). Consider non-atherosclerotic causes of chest pain. Consider preventive therapy and risk factor modification. Electronically Signed: By: Buford Dresser M.D. 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 She initially presented on 01/21/2022 with transient episode of aphasia followed sudden onset dizziness, nausea, vomiting, chest pain that radiated to upper back on Saturday. MRI brain showed small ischemic infarct of the frontal Gos[place] matter on the left. Her troponins were elevated to over 300 and her echocardiogram showed apical hypokinesis and a left ventricular thrombus consistent with NSTEMI. Around 6 p.m. on 01/21/2022, patient developed worsening aphasia, NIHSS 6. Head CT was unremarkable. CT angiogram of the head and neck showed a left M2/M3-MCA occlusion. She was then transferred to our service for an emergency mechanical thrombectomy. EXAM: ULTRASOUND-GUIDED VASCULAR ACCESS DIAGNOSTIC CEREBRAL ANGIOGRAM MECHANICAL THROMBECTOMY FLAT PANEL  HEAD CT COMPARISON:  CT/CT angiogram of the head and neck January 22, 2019 MEDICATIONS: No antibiotic was administered. ANESTHESIA/SEDATION: The procedure was performed under general anesthesia. CONTRAST:  75 mL of Omnipaque 300 milligram/mL. FLUOROSCOPY: Radiation Exposure Index (as provided by the fluoroscopic device): 161.0 mGy Kerma COMPLICATIONS: None immediate. TECHNIQUE: Informed written consent was obtained from the patient after a thorough discussion of the procedural risks, benefits and alternatives. All questions were addressed. Maximal Sterile Barrier Technique was utilized including caps, mask, sterile gowns, sterile gloves, sterile drape, hand hygiene and skin antiseptic. A timeout was performed prior to the initiation of the procedure. The right groin was prepped and draped in the usual sterile fashion. Using a micropuncture kit and the modified Seldinger technique, access was gained to the right common femoral artery and an 8 French sheath was placed. Real-time ultrasound guidance was utilized for vascular access including the acquisition of a permanent ultrasound image documenting patency of the accessed vessel.  Under fluoroscopy, a Zoom 88 guide catheter was navigated over a 6 [place] VTK catheter and a 0.035" Terumo Glidewire into the aortic arch. The catheter was placed into the left common carotid artery and then advanced into the left internal carotid artery. The diagnostic catheter was removed. Frontal and lateral angiograms of the head were obtained. FINDINGS: 1. Normal caliber of the right common femoral artery, adequate for vascular access. 2. There is no occlusion of the distal left M2/MCA superior division branch extending to the M3 segment. PROCEDURE: Using biplane roadmap, a Vect 46 aspiration catheter was navigated over an Aristotle 24 microguidewire into the cavernous segment of the left ICA. The aspiration catheter was then advanced over the wire to the level of occlusion in the M2-M3 segment and connected to an aspiration pump. Continuous aspiration was performed for 2 minutes. The guide catheter was connected to a VacLok syringe. The aspiration catheter was subsequently removed under constant aspiration. The guide catheter was aspirated for debris. Left internal carotid artery angiograms with magnified frontal and lateral views of the head showed recanalization of the left MCA with slow flow in a few distal cortical branches (TICI 2C). No embolus to new territory. Flat panel CT of the head was obtained and post processed in a separate workstation with concurrent attending physician supervision. Selected images were sent to PACS. No evidence of hemorrhagic complication. Delayed left internal carotid artery angiogram showed persistent patency of the left MCA. Right common femoral artery angiogram was obtained in right anterior oblique view. The puncture is at the level of the common femoral artery. The artery has normal caliber, adequate for closure device. The sheath was exchanged over the wire for a Perclose prostyle which was utilized for access closure. Immediate hemostasis was achieved. IMPRESSION: 1.  Successful mechanical thrombectomy for treatment of a distal left M2/MCA occlusion with direct contact aspiration achieving complete recanalization (TICI 2C). 2. No thromboembolic or hemorrhagic complication. PLAN: Transfer to ICU for continued post stroke care. Electronically Signed   By: Pedro Earls M.D.   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 She initially presented on 01/21/2022 with transient episode of aphasia followed sudden onset dizziness, nausea, vomiting, chest pain that radiated to upper back on Saturday. MRI brain showed small ischemic infarct of the frontal  [place] matter on the left. Her troponins were elevated to over 300 and her echocardiogram showed apical hypokinesis and a left ventricular thrombus consistent with NSTEMI. Around 6 p.m. on 01/21/2022, patient developed worsening aphasia, NIHSS 6. Head CT was unremarkable. CT angiogram of the head and neck showed a left M2/M3-MCA occlusion. She was then transferred to our service for an emergency mechanical thrombectomy. EXAM: ULTRASOUND-GUIDED VASCULAR ACCESS DIAGNOSTIC CEREBRAL ANGIOGRAM MECHANICAL THROMBECTOMY FLAT PANEL HEAD CT COMPARISON:  CT/CT angiogram of the head and neck January 22, 2019 MEDICATIONS: No antibiotic was administered. ANESTHESIA/SEDATION: The procedure was performed under general anesthesia. CONTRAST:  75 mL of Omnipaque 300 milligram/mL. FLUOROSCOPY: Radiation Exposure Index (as provided by the fluoroscopic device): 601.5 mGy Kerma COMPLICATIONS: None immediate. TECHNIQUE: Informed written consent was obtained from the patient after a thorough discussion of the procedural risks, benefits and  alternatives. All questions were addressed. Maximal Sterile Barrier Technique was utilized including caps, mask, sterile gowns, sterile gloves, sterile drape, hand hygiene and skin antiseptic. A timeout was performed prior to the initiation of the procedure. The right groin was prepped and draped in the usual sterile fashion. Using a micropuncture kit and the modified Seldinger technique, access was gained to the right common femoral artery and an 8 French sheath was placed. Real-time ultrasound guidance was utilized for vascular access including the acquisition of a permanent ultrasound image documenting patency of the accessed vessel. Under fluoroscopy, a Zoom 88 guide catheter was navigated over a 6 [place] VTK catheter and a 0.035" Terumo Glidewire into the aortic arch. The catheter was placed into the left common carotid artery and then advanced into the left internal carotid artery. The diagnostic catheter was removed. Frontal and lateral angiograms of the head were obtained. FINDINGS: 1. Normal caliber of the right common femoral artery, adequate for vascular access. 2. There is no occlusion of the distal left M2/MCA superior division branch extending to the M3 segment. PROCEDURE: Using biplane roadmap, a Vect 46 aspiration catheter was navigated over an Aristotle 24 microguidewire into the cavernous segment of the left ICA. The aspiration catheter was then advanced over the wire to the level of occlusion in the M2-M3 segment and connected to an aspiration pump. Continuous aspiration was performed for 2 minutes. The guide catheter was connected to a VacLok syringe. The aspiration catheter was subsequently removed under constant aspiration. The guide catheter was aspirated for debris. Left internal carotid artery angiograms with magnified frontal and lateral views of the head showed recanalization of the left MCA with slow flow in a few distal cortical branches (TICI 2C). No embolus to new territory. Flat panel  CT of the head was obtained and post processed in a separate workstation with concurrent attending physician supervision. Selected images were sent to PACS. No evidence of hemorrhagic complication. Delayed left internal carotid artery angiogram showed persistent patency of the left MCA. Right common femoral artery angiogram was obtained in right anterior oblique view. The puncture is at the level of the common femoral artery. The artery has normal caliber, adequate for closure device. The sheath was exchanged over the wire for a Perclose prostyle which was utilized for access closure. Immediate hemostasis was achieved. IMPRESSION: 1. Successful mechanical thrombectomy for treatment of a distal left M2/MCA occlusion with direct contact aspiration achieving complete recanalization (TICI 2C). 2. No thromboembolic or hemorrhagic complication. PLAN: Transfer to ICU for continued post stroke care. Electronically Signed   By: Pedro Earls M.D.   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 She initially presented on 01/21/2022 with transient episode of aphasia followed sudden onset dizziness, nausea, vomiting, chest pain that radiated to upper back on Saturday. MRI brain showed small ischemic infarct of the frontal [place] matter on the left. Her troponins were elevated to over 300 and her echocardiogram showed apical hypokinesis and a left ventricular thrombus consistent with NSTEMI. Around 6 p.m. on 01/21/2022, patient developed worsening aphasia, NIHSS 6. Head CT was unremarkable. CT angiogram of the head and neck showed a left M2/M3-MCA occlusion. She was then transferred to our  service for an emergency mechanical thrombectomy. EXAM: ULTRASOUND-GUIDED VASCULAR ACCESS DIAGNOSTIC CEREBRAL ANGIOGRAM MECHANICAL THROMBECTOMY FLAT PANEL HEAD CT COMPARISON:  CT/CT angiogram of the head and neck January 22, 2019 MEDICATIONS: No antibiotic was administered. ANESTHESIA/SEDATION: The procedure was performed under general anesthesia. CONTRAST:  75 mL of Omnipaque 300 milligram/mL. FLUOROSCOPY: Radiation Exposure Index (as provided by the fluoroscopic device): 694.8 mGy Kerma COMPLICATIONS: None immediate. TECHNIQUE: Informed written consent was obtained from the patient after a thorough discussion of the procedural risks, benefits and alternatives. All questions were addressed. Maximal Sterile Barrier Technique was utilized including caps, mask, sterile gowns, sterile gloves, sterile drape, hand hygiene and skin antiseptic. A timeout was performed prior to the initiation of the procedure. The right groin was prepped and draped in the usual sterile fashion. Using a micropuncture kit and the modified Seldinger technique, access was gained to the right common femoral artery and an 8 French sheath was placed. Real-time ultrasound guidance was utilized for vascular access including the acquisition of a permanent ultrasound image documenting patency of the accessed vessel. Under fluoroscopy, a Zoom 88 guide catheter was navigated over a 6 [place] VTK catheter and a 0.035" Terumo Glidewire into the aortic arch. The catheter was placed into the left common carotid artery and then advanced into the left internal carotid artery. The diagnostic catheter was removed. Frontal and lateral angiograms of the head were obtained. FINDINGS: 1. Normal caliber of the right common femoral artery, adequate for vascular access. 2. There is no occlusion of the distal left M2/MCA superior division branch extending to the M3 segment. PROCEDURE: Using biplane roadmap, a Vect 46 aspiration catheter was navigated over an Aristotle 24  microguidewire into the cavernous segment of the left ICA. The aspiration catheter was then advanced over the wire to the level of occlusion in the M2-M3 segment and connected to an aspiration pump. Continuous aspiration was performed for 2 minutes. The guide catheter was connected to a VacLok syringe. The aspiration catheter was subsequently removed under constant aspiration. The guide catheter was aspirated for debris. Left internal carotid artery angiograms with magnified frontal and lateral views of the head showed recanalization of the left MCA with slow flow in a few distal cortical branches (TICI 2C). No embolus to new territory. Flat panel CT of the head was obtained and post processed in a separate workstation with concurrent attending physician supervision. Selected images were sent to PACS. No evidence of hemorrhagic complication. Delayed left internal carotid artery angiogram showed persistent patency of the left MCA. Right common femoral artery angiogram was obtained in right anterior oblique view. The puncture is at the level of the common femoral artery. The artery has normal caliber, adequate for  closure device. The sheath was exchanged over the wire for a Perclose prostyle which was utilized for access closure. Immediate hemostasis was achieved. IMPRESSION: 1. Successful mechanical thrombectomy for treatment of a distal left M2/MCA occlusion with direct contact aspiration achieving complete recanalization (TICI 2C). 2. No thromboembolic or hemorrhagic complication. PLAN: Transfer to ICU for continued post stroke care. Electronically Signed   By: Pedro Earls M.D.   On: 01/22/2022 14:25   MR BRAIN WO CONTRAST  Result Date: 01/22/2022 CLINICAL DATA:  Follow-up stroke EXAM: MRI HEAD WITHOUT CONTRAST TECHNIQUE: Multiplanar, multiecho pulse sequences of the brain and surrounding structures were obtained without intravenous contrast. COMPARISON:  Brain MRI and CT/CTA head and neck 1  day prior FINDINGS: Brain: A punctate fusion of diffusion restriction in the left frontal lobe periventricular [place] matter is unchanged compared to the study from 1 day prior. Additional small foci of diffusion restriction in the left external capsule, insula, and frontal lobe cortex are new. There is no associated hemorrhage or mass effect. Parenchymal volume is normal. The ventricles are normal in size. Parenchymal signal is otherwise normal, with no significant burden of under vitamin chronic small vessel ischemic change. There is no mass lesion.  There is no mass effect or midline shift. Vascular: Normal flow voids. Skull and upper cervical spine: Normal marrow signal. Sinuses/Orbits: The paranasal sinuses are clear. The globes and orbits are unremarkable. Other: None. IMPRESSION: New punctate acute infarcts in the left external capsule, insula, and frontal lobe cortex, and unchanged additional punctate infarct in the left frontal lobe Balke matter. Electronically Signed   By: Valetta Mole M.D.   On: 01/22/2022 11:39   CT CEREBRAL PERFUSION W CONTRAST  Result Date: 01/21/2022 CLINICAL DATA:  Neuro deficit, acute, stroke suspected. EXAM: CT ANGIOGRAPHY HEAD AND NECK CT PERFUSION BRAIN TECHNIQUE: Multidetector CT imaging of the head and neck was performed using the standard protocol during bolus administration of intravenous contrast. Multiplanar CT image reconstructions and MIPs were obtained to evaluate the vascular anatomy. Carotid stenosis measurements (when applicable) are obtained utilizing NASCET criteria, using the distal internal carotid diameter as the denominator. Multiphase CT imaging of the brain was performed following IV bolus contrast injection. Subsequent parametric perfusion maps were calculated using RAPID software. RADIATION DOSE REDUCTION: This exam was performed according to the departmental dose-optimization program which includes automated exposure control, adjustment of the mA and/or  kV according to patient size and/or use of iterative reconstruction technique. CONTRAST:  162m OMNIPAQUE IOHEXOL 350 MG/ML SOLN COMPARISON:  Brain MRI 01/21/2022. Noncontrast head CT performed earlier today 01/21/2022. FINDINGS: CTA NECK FINDINGS Aortic arch: Standard aortic branching. Mild atherosclerotic plaque within the visualized aortic arch and proximal major branch vessels of the neck. Streak and beam hardening artifact arising from a dense right-sided contrast bolus partially obscures the right subclavian artery. Within this limitation, there is no appreciable hemodynamically significant innominate or proximal subclavian artery stenosis. Right carotid system: CCA and ICA patent within the neck without stenosis. Mild atherosclerotic plaque about the carotid bifurcation. Left carotid system: CCA and ICA patent within the neck without stenosis. Minimal atherosclerotic plaque about the carotid bifurcation. Vertebral arteries: Vertebral arteries codominant and patent within the neck without stenosis. Nonstenotic atherosclerotic plaque at the origin of the right vertebral artery. Skeleton: Cervical spondylosis. No acute fracture or aggressive osseous lesion. Other neck: No neck mass or cervical lymphadenopathy. Upper chest: No consolidation within the imaged lung apices. Review of the MIP images confirms the above findings CTA HEAD  FINDINGS Anterior circulation: The intracranial internal carotid arteries are patent. The M1 middle cerebral arteries are patent. Occluded mid M2 left MCA vessel (with distal reconstitution, likely due to collateral flow) (for instance as seen on series 15, image 15). The anterior cerebral arteries are patent. No intracranial aneurysm is identified. Posterior circulation: The intracranial vertebral arteries are patent. The basilar artery is patent. The posterior cerebral arteries are patent. Posterior communicating arteries are diminutive or absent, bilaterally. Venous sinuses: Within  the limitations of contrast timing, no convincing thrombus. Anatomic variants: As described. Review of the MIP images confirms the above findings CT Brain Perfusion Findings: CBF (<30%) Volume: 75m Perfusion (Tmax>6.0s) volume: 7724mMismatch Volume: 24m69mnfarction Location:None identified CTA head impression and CT perfusion head impression called by telephone at the time of interpretation on 01/21/2022 at 6:50 pm to provider [facility]who verbally acknowledged these results. IMPRESSION: CTA neck: 1. The common carotid, internal carotid and vertebral arteries are patent within the neck without stenosis. Minimal non-stenotic atherosclerotic plaque about the carotid bifurcations and at the origin of the right vertebral artery. 2.  Aortic Atherosclerosis (ICD10-I70.0). CTA head: Occluded mid M2 left MCA vessel (with distal reconstitution, likely due to collateral flow). CT perfusion head: The perfusion software identifies a 7 mL region of critically hypoperfused parenchyma within the left insula/frontal lobe (utilizing the Tmax>6 seconds threshold). No core infarct is identified. Reported mismatch volume: 7 mL. Electronically Signed   By: KylKellie SimmeringO.   On: 01/21/2022 19:09   CT ANGIO HEAD NECK W WO CM (CODE STROKE)  Result Date: 01/21/2022 CLINICAL DATA:  Neuro deficit, acute, stroke suspected. EXAM: CT ANGIOGRAPHY HEAD AND NECK CT PERFUSION BRAIN TECHNIQUE: Multidetector CT imaging of the head and neck was performed using the standard protocol during bolus administration of intravenous contrast. Multiplanar CT image reconstructions and MIPs were obtained to evaluate the vascular anatomy. Carotid stenosis measurements (when applicable) are obtained utilizing NASCET criteria, using the distal internal carotid diameter as the denominator. Multiphase CT imaging of the brain was performed following IV bolus contrast injection. Subsequent parametric perfusion maps were calculated using RAPID software.  RADIATION DOSE REDUCTION: This exam was performed according to the departmental dose-optimization program which includes automated exposure control, adjustment of the mA and/or kV according to patient size and/or use of iterative reconstruction technique. CONTRAST:  1024m53mNIPAQUE IOHEXOL 350 MG/ML SOLN COMPARISON:  Brain MRI 01/21/2022. Noncontrast head CT performed earlier today 01/21/2022. FINDINGS: CTA NECK FINDINGS Aortic arch: Standard aortic branching. Mild atherosclerotic plaque within the visualized aortic arch and proximal major branch vessels of the neck. Streak and beam hardening artifact arising from a dense right-sided contrast bolus partially obscures the right subclavian artery. Within this limitation, there is no appreciable hemodynamically significant innominate or proximal subclavian artery stenosis. Right carotid system: CCA and ICA patent within the neck without stenosis. Mild atherosclerotic plaque about the carotid bifurcation. Left carotid system: CCA and ICA patent within the neck without stenosis. Minimal atherosclerotic plaque about the carotid bifurcation. Vertebral arteries: Vertebral arteries codominant and patent within the neck without stenosis. Nonstenotic atherosclerotic plaque at the origin of the right vertebral artery. Skeleton: Cervical spondylosis. No acute fracture or aggressive osseous lesion. Other neck: No neck mass or cervical lymphadenopathy. Upper chest: No consolidation within the imaged lung apices. Review of the MIP images confirms the above findings CTA HEAD FINDINGS Anterior circulation: The intracranial internal carotid arteries are patent. The M1 middle cerebral arteries are patent. Occluded mid M2 left MCA vessel (  with distal reconstitution, likely due to collateral flow) (for instance as seen on series 15, image 15). The anterior cerebral arteries are patent. No intracranial aneurysm is identified. Posterior circulation: The intracranial vertebral arteries are  patent. The basilar artery is patent. The posterior cerebral arteries are patent. Posterior communicating arteries are diminutive or absent, bilaterally. Venous sinuses: Within the limitations of contrast timing, no convincing thrombus. Anatomic variants: As described. Review of the MIP images confirms the above findings CT Brain Perfusion Findings: CBF (<30%) Volume: 65m Perfusion (Tmax>6.0s) volume: 745mMismatch Volume: 44m61mnfarction Location:None identified CTA head impression and CT perfusion head impression called by telephone at the time of interpretation on 01/21/2022 at 6:50 pm to provider MCNEILL KIRAss[facility] LLCwho verbally acknowledged these results. IMPRESSION: CTA neck: 1. The common carotid, internal carotid and vertebral arteries are patent within the neck without stenosis. Minimal non-stenotic atherosclerotic plaque about the carotid bifurcations and at the origin of the right vertebral artery. 2.  Aortic Atherosclerosis (ICD10-I70.0). CTA head: Occluded mid M2 left MCA vessel (with distal reconstitution, likely due to collateral flow). CT perfusion head: The perfusion software identifies a 7 mL region of critically hypoperfused parenchyma within the left insula/frontal lobe (utilizing the Tmax>6 seconds threshold). No core infarct is identified. Reported mismatch volume: 7 mL. Electronically Signed   By: KylKellie SimmeringO.   On: 01/21/2022 19:09   CT HEAD CODE STROKE WO CONTRAST  Result Date: 01/21/2022 CLINICAL DATA:  Code stroke. Neuro deficit, acute, stroke suspected. EXAM: CT HEAD WITHOUT CONTRAST TECHNIQUE: Contiguous axial images were obtained from the base of the skull through the vertex without intravenous contrast. RADIATION DOSE REDUCTION: This exam was performed according to the departmental dose-optimization program which includes automated exposure control, adjustment of the mA and/or kV according to patient size and/or use of iterative reconstruction technique. COMPARISON:  Same-day  brain MRI 01/21/2022. FINDINGS: Brain: No age advanced or lobar predominant parenchymal atrophy. A subcentimeter acute infarct within the left frontal lobe Kluger matter (adjacent to the left lateral ventricle frontal horn) is occult by CT and was better appreciated on the brain MRI performed earlier today. No CT evidence of interval acute infarct. Minimal chronic small-vessel image changes within the cerebral Pointer matter and pons, better appreciated on the prior brain MRI. There is no acute intracranial hemorrhage. No extra-axial fluid collection. No evidence of an intracranial mass. No midline shift. Vascular: No hyperdense vessel. Skull: No fracture or aggressive osseous lesion. Sinuses/Orbits: No mass or acute finding within the imaged orbits. Trace mucosal thickening within the anterior left ethmoid air cells. ASPECTS ([place] [facility] Early CT Score) - Ganglionic level infarction (caudate, lentiform nuclei, internal capsule, insula, M1-M3 cortex): 7 - Supraganglionic infarction (M4-M6 cortex): 3 Total score (0-10 with 10 being normal): 10 These results were communicated to Dr. KirLeonel Ramsay 6:27 pmon 8/29/2023by text page via the [facility] Campusssaging system. IMPRESSION: A known subcentimeter acute infarct within the left frontal lobe Bambrick matter is occult by CT, and was better appreciated on the brain MRI performed earlier today. No CT evidence of interval acute intracranial abnormality. Minimal chronic small-vessel ischemic changes within the cerebral Goebel matter and pons. Electronically Signed   By: KylKellie SimmeringO.   On: 01/21/2022 18:27   ECHOCARDIOGRAM COMPLETE  Result Date: 01/21/2022    ECHOCARDIOGRAM REPORT   Patient Name:   ANNATTICUS LEMBERGERITE Date of Exam: 01/21/2022 Medical Rec #:  [id number] Height:       61.0 in Accession #:  [id number]   Weight:       171.0 lb Date of Birth:  August 09, 1955    BSA:          1.767 m Patient Age:    9 years     BP:           146/125 mmHg Patient Gender: F             HR:           72 bpm. Exam Location:  Inpatient Procedure: 2D Echo, Color Doppler and Cardiac Doppler Indications:    Stroke i63.9  History:        Patient has no prior history of Echocardiogram examinations.                 Risk Factors:Hypertension.  Sonographer:    Raquel Sarna Senior RDCS Referring Phys: Kake  1. Left ventricular ejection fraction, by estimation, is 45 to 50%. The left ventricle has mildly decreased function. The left ventricle demonstrates regional wall motion abnormalities (apical hypokinesis with out true aneurysm and with presence of an LV thrombus 1.84 X 0.9 X 1.2 cm).  2. The mitral valve is grossly normal. No evidence of mitral valve regurgitation. No evidence of mitral stenosis.  3. The aortic valve is tricuspid. Aortic valve regurgitation is not visualized. No aortic stenosis is present.  4. Right ventricular systolic function is normal. The right ventricular size is normal. Tricuspid regurgitation signal is inadequate for assessing PA pressure.  5. The inferior vena cava is normal in size with greater than 50% respiratory variability, suggesting right atrial pressure of 3 mmHg. Comparison(s): No prior Echocardiogram. 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 Cardiology aware. FINDINGS  Left Ventricle: Left ventricular ejection fraction, by estimation, is 45 to 50%. The left ventricle has mildly decreased function. The left ventricle demonstrates regional wall motion abnormalities. The left ventricular internal cavity size was small. There is no left ventricular hypertrophy. Left ventricular diastolic parameters are consistent with Grade I diastolic dysfunction (impaired relaxation).  LV Wall Scoring: The apical lateral segment, apical septal segment, apical anterior segment, and apical inferior segment are hypokinetic. Right Ventricle: The right ventricular size is normal. No increase in right ventricular wall thickness. Right  ventricular systolic function is normal. Tricuspid regurgitation signal is inadequate for assessing PA pressure. Left Atrium: Left atrial size was normal in size. Right Atrium: Right atrial size was normal in size. Pericardium: There is no evidence of pericardial effusion. Mitral Valve: The mitral valve is grossly normal. No evidence of mitral valve regurgitation. No evidence of mitral valve stenosis. Tricuspid Valve: The tricuspid valve is normal in structure. Tricuspid valve regurgitation is not demonstrated. No evidence of tricuspid stenosis. Aortic Valve: The aortic valve is tricuspid. Aortic valve regurgitation is not visualized. No aortic stenosis is present. Pulmonic Valve: The pulmonic valve was normal in structure. Pulmonic valve regurgitation is not visualized. No evidence of pulmonic stenosis. Aorta: The aortic root and ascending aorta are structurally normal, with no evidence of dilitation. Venous: The inferior vena cava is normal in size with greater than 50% respiratory variability, suggesting right atrial pressure of 3 mmHg. IAS/Shunts: No atrial level shunt detected by color flow Doppler.  LEFT VENTRICLE PLAX 2D LVIDd:         3.60 cm     Diastology LVIDs:         2.50 cm     LV e' medial:    6.68 cm/s LV PW:  0.80 cm     LV E/e' medial:  6.5 LV IVS:        0.80 cm     LV e' lateral:   11.00 cm/s LVOT diam:     1.70 cm     LV E/e' lateral: 3.9 LV SV:         42 LV SV Index:   24 LVOT Area:     2.27 cm  LV Volumes (MOD) LV vol d, MOD A2C: 70.1 ml LV vol d, MOD A4C: 46.6 ml LV vol s, MOD A2C: 37.3 ml LV vol s, MOD A4C: 23.3 ml LV SV MOD A2C:     32.8 ml LV SV MOD A4C:     46.6 ml LV SV MOD BP:      28.7 ml RIGHT VENTRICLE RV S prime:     9.32 cm/s TAPSE (M-mode): 1.7 cm LEFT ATRIUM             Index        RIGHT ATRIUM          Index LA diam:        2.70 cm 1.53 cm/m   RA Area:     9.52 cm LA Vol (A2C):   28.1 ml 15.90 ml/m  RA Volume:   20.50 ml 11.60 ml/m LA Vol (A4C):   26.3 ml 14.88  ml/m LA Biplane Vol: 28.8 ml 16.30 ml/m  AORTIC VALVE LVOT Vmax:   115.00 cm/s LVOT Vmean:  68.700 cm/s LVOT VTI:    0.185 m  AORTA Ao Root diam: 2.90 cm Ao Asc diam:  3.20 cm MITRAL VALVE MV Area (PHT): 3.11 cm    SHUNTS MV Decel Time: 244 msec    Systemic VTI:  0.18 m MV E velocity: 43.20 cm/s  Systemic Diam: 1.70 cm MV A velocity: 64.50 cm/s MV E/A ratio:  0.67 Rudean Haskell MD Electronically signed by Rudean Haskell MD Signature Date/Time: 01/21/2022/4:04:02 PM    Final    MR BRAIN WO CONTRAST  Result Date: 01/21/2022 CLINICAL DATA:  TIA.  Sudden onset of aphasia, now resolved. EXAM: MRI HEAD WITHOUT CONTRAST TECHNIQUE: Multiplanar, multiecho pulse sequences of the brain and surrounding structures were obtained without intravenous contrast. COMPARISON:  None Available. FINDINGS: Brain: Small acute infarct in the left frontal Bushee matter. No hemorrhage, hydrocephalus, mass, or atrophy. Minimal chronic small vessel changes may be present in the pons. No prior infarct is seen. Vascular: Major flow voids are preserved Skull and upper cervical spine: Normal marrow signal. Degenerative facet spurring where covered in the cervical spine with mild C2-3 anterolisthesis. Sinuses/Orbits: Negative IMPRESSION: Small acute infarct in the left frontal Ryle matter. Electronically Signed   By: Jorje Guild M.D.   On: 01/21/2022 12:32    EKG: SR anterolateral T wave changes stable   ASSESSMENT AND PLAN:   [place]:  CTA with no critical CAD. 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 Repeat TTE pending but ECG stable no troponin bump and no chest pain Consider adding coreg CVA:  Cannot be sure if issues is spasm or more local thrombosis at previous atherectomy site or embolic event and or failure of eliquis. Extensive discussion with Dr Acie Fredrickson and Leonie Man Shared decision making with patient , husband and family Burtis Junes changing to coumadin with close INR f/u for 3-6 months Risk of sub  Rx INR but most of literature for Rx of apical thrombus is with coumadin and eliquis should be at steady state after 3  doses.  She is getting swallowing study Can continue heparin in rehab or change to lovenox per pharmacy   Signed: Jenkins Rouge 01/30/2022, 10:58 AM

## 2022-01-30 NOTE — Progress Notes (Signed)
STROKE TEAM PROGRESS NOTE   SUBJECTIVE (INTERVAL HISTORY) Her husband and two daughters as well as speech therapist are at the bedside. Still on heparin IV. Still has expressive aphasia but able to make sound out, not meaningful words yet. RUE now 3/5.   OBJECTIVE Temp:  [97.8 F (36.6 C)-98.8 F (37.1 C)] 98.2 F (36.8 C) (09/07 0800) Pulse Rate:  [71-101] 75 (09/07 1000) Cardiac Rhythm: Normal sinus rhythm (09/07 1200) Resp:  [10-30] 18 (09/07 1000) BP: (111-157)/(68-89) 148/87 (09/07 1200) SpO2:  [95 %-100 %] 99 % (09/07 1000)  Recent Labs  Lab 01/28/22 1328 01/28/22 1747  GLUCAP 125* 103*   Recent Labs  Lab 01/26/22 1557 01/28/22 1330 01/28/22 1337 01/28/22 1747 01/29/22 0845 01/30/22 0143  NA 142 141 138 142 138 139  K 3.3* 3.8 4.0 3.7 3.5 2.9*  CL 109 109 109  --  110 112*  CO2 22 23  --   --  20* 21*  GLUCOSE 95 115* 110*  --  148* 100*  BUN $Re'9 8 10  'WGW$ --  6* <5*  CREATININE 0.61 0.84 0.70  --  0.56 0.65  CALCIUM 9.6 9.4  --   --  8.8* 8.3*   Recent Labs  Lab 01/28/22 1330  AST 22  ALT 18  ALKPHOS 45  BILITOT 0.3  PROT 6.4*  ALBUMIN 3.9   Recent Labs  Lab 01/26/22 1118 01/28/22 1330 01/28/22 1337 01/28/22 1747 01/29/22 0300 01/30/22 0143  WBC 5.4 4.7  --   --  8.6 5.3  NEUTROABS  --  2.5  --   --  6.1  --   HGB 13.8 12.0 11.9* 10.2* 11.2* 9.2*  HCT 44.1 35.5* 35.0* 30.0* 33.3* 27.3*  MCV 93.2 88.5  --   --  87.6 86.4  PLT 170 171  --   --  173 143*   No results for input(s): "CKTOTAL", "CKMB", "CKMBINDEX", "TROPONINI" in the last 168 hours. Recent Labs    01/28/22 1330  LABPROT 15.0  INR 1.2   Recent Labs    01/28/22 1831  COLORURINE YELLOW  LABSPEC >1.046*  PHURINE 5.0  GLUCOSEU NEGATIVE  HGBUR NEGATIVE  BILIRUBINUR NEGATIVE  KETONESUR NEGATIVE  PROTEINUR 30*  NITRITE NEGATIVE  LEUKOCYTESUR NEGATIVE       Component Value Date/Time   CHOL 223 (H) 01/22/2022 0351   TRIG 271 (H) 01/29/2022 0300   HDL 40 (L) 01/22/2022 0351    CHOLHDL 5.6 01/22/2022 0351   VLDL 33 01/22/2022 0351   LDLCALC 150 (H) 01/22/2022 0351   Lab Results  Component Value Date   HGBA1C 5.0 01/22/2022      Component Value Date/Time   LABOPIA NONE DETECTED 01/28/2022 1831   COCAINSCRNUR NONE DETECTED 01/28/2022 1831   LABBENZ NONE DETECTED 01/28/2022 1831   AMPHETMU NONE DETECTED 01/28/2022 1831   THCU POSITIVE (A) 01/28/2022 1831   LABBARB NONE DETECTED 01/28/2022 1831    Recent Labs  Lab 01/28/22 1330  ETH <10    I have personally reviewed the radiological images below and agree with the radiology interpretations.  IR PERCUTANEOUS ART THROMBECTOMY/INFUSION INTRACRANIAL INC DIAG ANGIO  Result Date: 01/30/2022 INDICATION: 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 EXAM: 1. EMERGENT LARGE VESSEL OCCLUSION THROMBOLYSIS (anterior CIRCULATION) COMPARISON:  None Available. MEDICATIONS: No antibiotic was administered within 1 hour of the procedure. ANESTHESIA/SEDATION: General anesthesia. CONTRAST:  Omnipaque 300 150 mL. FLUOROSCOPY TIME:  Fluoroscopy Time: 39 minutes 12 seconds (1289 mGy). COMPLICATIONS: None immediate. TECHNIQUE: Following a  full explanation of the procedure along with the potential associated complications, an informed witnessed consent was obtained. The risks of intracranial hemorrhage of 10%, worsening neurological deficit, ventilator dependency, death and inability to revascularize were all reviewed in detail with the patient's spouse. The patient was then put under general anesthesia by the Department of Anesthesiology at [facility]. The left groin was prepped and draped in the usual sterile fashion. Thereafter using modified Seldinger technique, transfemoral access into the left common femoral artery was obtained without difficulty. Over a 0.035 inch guidewire an 8 French 25 cm Pinnacle sheath was inserted. Through this, and also over a 0.035 inch guidewire a combination of a wrist Simmons 2 support  catheter inside of an 087 balloon guide catheter was advanced to the aortic region, and selectively positioned initially in the left common carotid artery and then advanced to the distal cervical left ICA over an 035 inch guidewire with support catheter. The guidewire was removed. Good aspiration obtained hub of the balloon guide catheter. Arteriogram was then performed proximally from the common carotid artery then the distal left internal carotid artery. FINDINGS: The left common carotid arteriogram demonstrates the left external carotid artery and its major branches to be widely patent. The left internal carotid artery at bulb to the cranial skull base demonstrates wide patency. Wide patency is noted of the petrous, the cavernous and the supraclinoid segments. Opacification of the left posterior cerebral artery via the left posterior communicating artery is noted. The left middle cerebral artery M1 segment is widely patent. The angiographic occlusion of the superior division mid M2 segment is noted. The left MCA inferior division opacifies into the capillary and venous phases. The left anterior cerebral artery opacifies into the capillary and venous phases. Transient cross-filling via the anterior communicating artery of the right anterior cerebral artery A2 segment and the A1 segment is evident. PROCEDURE: Over an Aristotle softip micro guidewire with a J configuration, a combination of a 132 cm Zoom aspiration catheter inside of which was a 160 cm Phenom microcatheter was advanced to the proximal left middle cerebral artery. Using a torque device, access was obtained with the micro guidewire through the occluded superior division into the proximal M3 segment followed by the microcatheter. The guidewire was removed. Good aspiration was obtained from the hub of the microcatheter. A gentle contrast injection demonstrated slow antegrade flow of contrast distally. A 3 mm x 36 mm retrieval device was then advanced to  the distal end of the microcatheter and deployed in the usual manner. At this time the Zoom aspiration catheter was advanced into the proximal superior division. With proximal flow arrest in the left internal carotid artery constant aspiration was applied at the hub of the Zoom aspiration catheter and the aspiration device 2 minutes of the balloon guide catheter with a 20 mL syringe. Combination of the retrieval, the catheter and the Zoom aspiration catheter was retrieved removed. Following reversal of flow arrest, control arteriogram with the balloon guide catheter in the left internal carotid artery demonstrated near complete opacification of the superior division with gradual improved flow into the M3 M4 segment of the superior division. Moderate spasm noted in the proximal superior division responded to 5 aliquotes of 25 mcg of nitroglycerin intra-arterially. Also noted was a small non flow limiting filling defect in a mid parietal branch of the inferior division in the distal M3 M4 region which also gradually started clearing over a period of 20 minutes. Control arteriogram performed through the balloon  guide catheter in the mid cervical left demonstrated near complete revascularization of the left MCA achieving a TICI 2C revascularization. A control arteriogram performed through the left common carotid artery demonstrated small dissection of the distal left internal carotid artery at the skull base medially without any flow limitation. The balloon guide catheter was removed. Diagnostic catheter arteriogram was then performed of the right common carotid artery and the right vertebral artery. These demonstrated the right external carotid artery and its major branches to be widely patent. Internal carotid artery at the bulb to the cranial skull base demonstrates wide patency. The petrous, the cavernous and the supraclinoid segments demonstrate wide patency as do the right middle cerebral artery and the right  anterior cerebral artery into the capillary and venous phases. The right vertebral artery demonstrates wide patency. More distally, patency is maintained of the basilar junction in the right posterior-inferior cerebellar artery. The basilar artery, the posterior cerebral arteries, the superior cerebellar arteries and the anterior-inferior cerebellar arteries demonstrate patency into the capillary and venous phases. Unopacified blood was noted in the basilar artery from the contralateral left vertebral artery. An 8 French Angio-Seal closure device demonstrates hemostasis at the left groin puncture site. Distal pulses remained palpable in both feet unchanged. A CT of the brain demonstrated no hemorrhagic complications. The patient was left intubated due to poor responsiveness following reversal of the anesthesia. Patient was then transferred to the neuro ICU post revascularization care. IMPRESSION: Status post endovascular revascularization of occluded division of the left middle cerebral artery achieving a TICI 2C revascularization of the left MCA distribution. Puncture to first pass approximately 36 mins. PLAN: Follow-up as per referring MD. Electronically Signed   By: Luanne Bras M.D.   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 EXAM: 1. EMERGENT LARGE VESSEL OCCLUSION THROMBOLYSIS (anterior CIRCULATION) COMPARISON:  None Available. MEDICATIONS: No antibiotic was administered within 1 hour of the procedure. ANESTHESIA/SEDATION: General anesthesia. CONTRAST:  Omnipaque 300 150 mL. FLUOROSCOPY TIME:  Fluoroscopy Time: 39 minutes 12 seconds (1289 mGy). COMPLICATIONS: None immediate. TECHNIQUE: Following a full explanation of the procedure along with the potential associated complications, an informed witnessed consent was obtained. The risks of intracranial hemorrhage of 10%, worsening neurological deficit, ventilator  dependency, death and inability to revascularize were all reviewed in detail with the patient's spouse. The patient was then put under general anesthesia by the Department of Anesthesiology at [facility] The left groin was prepped and draped in the usual sterile fashion. Thereafter using modified Seldinger technique, transfemoral access into the left common femoral artery was obtained without difficulty. Over a 0.035 inch guidewire an 8 French 25 cm Pinnacle sheath was inserted. Through this, and also over a 0.035 inch guidewire a combination of a wrist Simmons 2 support catheter inside of an 087 balloon guide catheter was advanced to the aortic region, and selectively positioned initially in the left common carotid artery and then advanced to the distal cervical left ICA over an 035 inch guidewire with support catheter. The guidewire was removed. Good aspiration obtained hub of the balloon guide catheter. Arteriogram was then performed proximally from the common carotid artery then the distal left internal carotid artery. FINDINGS: The left common carotid arteriogram demonstrates the left external carotid artery and its major branches to be widely patent. The left internal carotid artery at bulb to the cranial skull base demonstrates wide patency. Wide patency is noted of  the petrous, the cavernous and the supraclinoid segments. Opacification of the left posterior cerebral artery via the left posterior communicating artery is noted. The left middle cerebral artery M1 segment is widely patent. The angiographic occlusion of the superior division mid M2 segment is noted. The left MCA inferior division opacifies into the capillary and venous phases. The left anterior cerebral artery opacifies into the capillary and venous phases. Transient cross-filling via the anterior communicating artery of the right anterior cerebral artery A2 segment and the A1 segment is evident. PROCEDURE: Over an Aristotle softip  micro guidewire with a J configuration, a combination of a 132 cm Zoom aspiration catheter inside of which was a 160 cm Phenom microcatheter was advanced to the proximal left middle cerebral artery. Using a torque device, access was obtained with the micro guidewire through the occluded superior division into the proximal M3 segment followed by the microcatheter. The guidewire was removed. Good aspiration was obtained from the hub of the microcatheter. A gentle contrast injection demonstrated slow antegrade flow of contrast distally. A 3 mm x 36 mm retrieval device was then advanced to the distal end of the microcatheter and deployed in the usual manner. At this time the Zoom aspiration catheter was advanced into the proximal superior division. With proximal flow arrest in the left internal carotid artery constant aspiration was applied at the hub of the Zoom aspiration catheter and the aspiration device 2 minutes of the balloon guide catheter with a 20 mL syringe. Combination of the retrieval, the catheter and the Zoom aspiration catheter was retrieved removed. Following reversal of flow arrest, control arteriogram with the balloon guide catheter in the left internal carotid artery demonstrated near complete opacification of the superior division with gradual improved flow into the M3 M4 segment of the superior division. Moderate spasm noted in the proximal superior division responded to 5 aliquotes of 25 mcg of nitroglycerin intra-arterially. Also noted was a small non flow limiting filling defect in a mid parietal branch of the inferior division in the distal M3 M4 region which also gradually started clearing over a period of 20 minutes. Control arteriogram performed through the balloon guide catheter in the mid cervical left demonstrated near complete revascularization of the left MCA achieving a TICI 2C revascularization. A control arteriogram performed through the left common carotid artery demonstrated small  dissection of the distal left internal carotid artery at the skull base medially without any flow limitation. The balloon guide catheter was removed. Diagnostic catheter arteriogram was then performed of the right common carotid artery and the right vertebral artery. These demonstrated the right external carotid artery and its major branches to be widely patent. Internal carotid artery at the bulb to the cranial skull base demonstrates wide patency. The petrous, the cavernous and the supraclinoid segments demonstrate wide patency as do the right middle cerebral artery and the right anterior cerebral artery into the capillary and venous phases. The right vertebral artery demonstrates wide patency. More distally, patency is maintained of the basilar junction in the right posterior-inferior cerebellar artery. The basilar artery, the posterior cerebral arteries, the superior cerebellar arteries and the anterior-inferior cerebellar arteries demonstrate patency into the capillary and venous phases. Unopacified blood was noted in the basilar artery from the contralateral left vertebral artery. An 8 French Angio-Seal closure device demonstrates hemostasis at the left groin puncture site. Distal pulses remained palpable in both feet unchanged. A CT of the brain demonstrated no hemorrhagic complications. The patient was left intubated due to poor responsiveness  following reversal of the anesthesia. Patient was then transferred to the neuro ICU post revascularization care. IMPRESSION: Status post endovascular revascularization of occluded division of the left middle cerebral artery achieving a TICI 2C revascularization of the left MCA distribution. Puncture to first pass approximately 36 mins. PLAN: Follow-up as per referring MD. Electronically Signed   By: Luanne Bras M.D.   On: 01/30/2022 08:42   MR BRAIN WO CONTRAST  Result Date: 01/28/2022 CLINICAL DATA:  Right MCA stroke status post thrombectomy, follow-up EXAM:  MRI HEAD WITHOUT CONTRAST MRA HEAD WITHOUT CONTRAST TECHNIQUE: Multiplanar, multi-echo pulse sequences of the brain and surrounding structures were acquired without intravenous contrast. Angiographic images of the Circle of Willis were acquired using MRA technique without intravenous contrast. COMPARISON:  01/22/2022 MRI head, no prior MRA, correlation is made with CTA head and neck 01/21/2022 FINDINGS: Brain: New foci of restricted diffusion with ADC correlate in the left posterior frontal and anterior parietal cortex (series 5, images 80 8-95). Redemonstrated focus of restricted diffusion with ADC correlate in the left insula (series 5, image 76 and series 6, image 26). Other previously noted foci of restricted diffusion are decreased in conspicuity and do not have persistent ADC correlates. No acute hemorrhage, mass, mass effect, or midline shift. No hydrocephalus or extra-axial collection. Punctate focus of hemosiderin deposition in the left posterior frontal lobe. Minimal T2 hyperintense signal in the periventricular Parthasarathy matter and pons, likely the sequela of mild chronic small vessel ischemic disease. Vascular: Normal arterial flow voids. Skull and upper cervical spine: Normal marrow signal. Sinuses/Orbits: Minimal mucosal thickening in the ethmoid air cells. The orbits are unremarkable. Other: The mastoids are well aerated. MRA HEAD FINDINGS Anterior circulation: Both internal carotid arteries are patent to the termini, without significant stenosis. A1 segments patent. Normal anterior communicating artery. Anterior cerebral arteries are patent to their distal aspects. No M1 stenosis or occlusion. Distal MCA branches perfused and symmetric.In particular, the left MCA branches appear perfused and without focal stenosis. Posterior circulation: Vertebral arteries patent to the vertebrobasilar junction without stenosis. Basilar patent to its distal aspect. Superior cerebellar arteries patent bilaterally. Patent  P1 segments. PCAs perfused to their distal aspects without stenosis. Diminutive left posterior communicating artery. The right posterior communicating artery is not definitively visualized. Anatomic variants: None significant IMPRESSION: 1. 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 2. No intracranial large vessel occlusion or significant stenosis. In particular, left MCA branches appear perfused and without focal stenosis. These results will be called to the ordering clinician or representative by the Radiologist Assistant, and communication documented in the PACS or [employer]. Electronically Signed   By: Merilyn Baba M.D.   On: 01/28/2022 23:40   MR ANGIO HEAD WO CONTRAST  Result Date: 01/28/2022 CLINICAL DATA:  Right MCA stroke status post thrombectomy, follow-up EXAM: MRI HEAD WITHOUT CONTRAST MRA HEAD WITHOUT CONTRAST TECHNIQUE: Multiplanar, multi-echo pulse sequences of the brain and surrounding structures were acquired without intravenous contrast. Angiographic images of the Circle of Willis were acquired using MRA technique without intravenous contrast. COMPARISON:  01/22/2022 MRI head, no prior MRA, correlation is made with CTA head and neck 01/21/2022 FINDINGS: Brain: New foci of restricted diffusion with ADC correlate in the left posterior frontal and anterior parietal cortex (series 5, images 80 8-95). Redemonstrated focus of restricted diffusion with ADC correlate in the left insula (series 5, image 76 and series 6, image 26). Other previously noted foci of restricted diffusion are decreased in conspicuity and  do not have persistent ADC correlates. No acute hemorrhage, mass, mass effect, or midline shift. No hydrocephalus or extra-axial collection. Punctate focus of hemosiderin deposition in the left posterior frontal lobe. Minimal T2 hyperintense signal in the periventricular Denman matter and pons, likely the sequela of mild chronic small vessel ischemic  disease. Vascular: Normal arterial flow voids. Skull and upper cervical spine: Normal marrow signal. Sinuses/Orbits: Minimal mucosal thickening in the ethmoid air cells. The orbits are unremarkable. Other: The mastoids are well aerated. MRA HEAD FINDINGS Anterior circulation: Both internal carotid arteries are patent to the termini, without significant stenosis. A1 segments patent. Normal anterior communicating artery. Anterior cerebral arteries are patent to their distal aspects. No M1 stenosis or occlusion. Distal MCA branches perfused and symmetric.In particular, the left MCA branches appear perfused and without focal stenosis. Posterior circulation: Vertebral arteries patent to the vertebrobasilar junction without stenosis. Basilar patent to its distal aspect. Superior cerebellar arteries patent bilaterally. Patent P1 segments. PCAs perfused to their distal aspects without stenosis. Diminutive left posterior communicating artery. The right posterior communicating artery is not definitively visualized. Anatomic variants: None significant IMPRESSION: 1. 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 2. No intracranial large vessel occlusion or significant stenosis. In particular, left MCA branches appear perfused and without focal stenosis. These results will be called to the ordering clinician or representative by the Radiologist Assistant, and communication documented in the PACS or [employer]. Electronically Signed   By: Merilyn Baba M.D.   On: 01/28/2022 23:40   [place] EKG SITE RITE  Result Date: 01/28/2022 If Site Rite image not attached, placement could not be confirmed due to current cardiac rhythm.  DG Abd 1 View  Result Date: 01/28/2022 CLINICAL DATA:  Evaluate OG tube placement EXAM: ABDOMEN - 1 VIEW COMPARISON:  None Available. FINDINGS: The OG tube terminates in the left upper quadrant, in the region of the stomach. IMPRESSION: The OG tube appears to terminate  in the stomach. No other abnormalities. Electronically Signed   By: Dorise Bullion III M.D.   On: 01/28/2022 17:28   Portable Chest x-ray  Result Date: 01/28/2022 CLINICAL DATA:  [id number]. Encounter for ETT placement. EXAM: PORTABLE CHEST 1 VIEW COMPARISON:  Chest x-ray 01/26/2022, CT heart 01/23/2022 FINDINGS: Enteric tube with tip terminating 3 cm above the carina. Slightly more prominent aortic arch and aortic knob likely due to positioning and AP portable technique. Otherwise the heart and mediastinal contours are unchanged normal limits. No focal consolidation. No pulmonary edema. No pleural effusion. No pneumothorax. No acute osseous abnormality. IMPRESSION: 1. Slightly more prominent aortic arch and aortic knob likely due to positioning and AP portable technique. Finding can likely be further evaluated on CT angio head and neck 01/28/2022. 2. No active disease. Electronically Signed   By: Iven Finn M.D.   On: 01/28/2022 17:21   CT HEAD CODE STROKE WO CONTRAST  Result Date: 01/28/2022 CLINICAL DATA:  Code stroke.  Neuro deficit, acute, stroke suspected EXAM: CT HEAD WITHOUT CONTRAST TECHNIQUE: Contiguous axial images were obtained from the base of the skull through the vertex without intravenous contrast. RADIATION DOSE REDUCTION: This exam was performed according to the departmental dose-optimization program which includes automated exposure control, adjustment of the mA and/or kV according to patient size and/or use of iterative reconstruction technique. COMPARISON:  CT head 01/21/2022. FINDINGS: Brain: No evidence of acute large vascular territory infarction, hemorrhage, hydrocephalus, extra-axial collection or mass lesion/mass effect. Vascular: No hyperdense vessel identified. Skull: No  acute fracture. Sinuses/Orbits: Clear sinuses.  No acute orbital findings. Other: No mastoid effusions. ASPECTS [facility] Stroke Program Early CT Score) total score (0-10 with 10 being normal): 10. IMPRESSION: 1.  No evidence of acute intracranial abnormality. 2. ASPECTS is 10. Code stroke imaging results were communicated on 01/28/2022 at 1:43 pm to provider Bhagat via secure text paging. Electronically Signed   By: Margaretha Sheffield M.D.   On: 01/28/2022 13:43   DG Chest 2 View  Result Date: 01/26/2022 CLINICAL DATA:  Chest and shoulder pain.  Recent MRI and CVA. EXAM: CHEST - 2 VIEW COMPARISON:  11/21/2021 FINDINGS: The lungs are clear without focal pneumonia, edema, pneumothorax or pleural effusion. The cardiopericardial silhouette is within normal limits for size. The visualized bony structures of the thorax are unremarkable. IMPRESSION: No active cardiopulmonary disease. Electronically Signed   By: Misty Stanley M.D.   On: 01/26/2022 12:03   CT CORONARY MORPH W/CTA COR W/SCORE W/CA W/CM &/OR WO/CM  Addendum Date: 01/23/2022   ADDENDUM REPORT: 01/23/2022 13:25 EXAM: OVER-READ INTERPRETATION  CT CHEST The following report is a limited chest CT over-read performed by radiologist Dr. Lindaann Slough [facility] on 01/23/2022. This over-read does not include interpretation of cardiac or coronary anatomy or pathology. The coronary CTA interpretation by the cardiologist is attached. COMPARISON:  Radiographs 11/21/2021. Coronary artery calcium score CT 10/04/2020 FINDINGS: Mediastinum/Nodes: No enlarged lymph nodes within the visualized mediastinum. Lungs/Pleura: There is no pleural effusion. The visualized lungs appear clear. Upper abdomen: No significant findings within the visualized upper abdomen. Musculoskeletal/Chest wall: No chest wall mass or suspicious osseous findings within the visualized chest. IMPRESSION: No significant extracardiac findings within the visualized chest. Electronically Signed   By: Richardean Sale M.D.   On: 01/23/2022 13:25   Result Date: 01/23/2022 HISTORY: Chest pain/anginal equiv, ECGs or troponins abnormal EXAM: Cardiac/Coronary CT TECHNIQUE: The patient was scanned on a  [employer]. PROTOCOL: A 100 kV prospective scan was triggered in the descending thoracic aorta at 111 HU's. Axial non-contrast 3 mm slices were carried out through the heart. The data set was analyzed on a dedicated work station and scored using the Agatston method. Gantry rotation speed was 250 msecs and collimation was .6 mm. Heart rate was optimized medically and sl NTG was given. The 3D data set was reconstructed in 5% intervals of the 35-75 % of the R-R cycle. Systolic and diastolic phases were analyzed on a dedicated work station using MPR, MIP and VRT modes. The patient received contrast. FINDINGS: Coronary calcium score: The patient's coronary artery calcium score is 0, which places the patient in the 0 percentile. Coronary arteries: Normal coronary origins.  Right dominance. Right Coronary Artery: Normal caliber vessel, gives rise to small PDA. No significant plaque or stenosis. Left Main Coronary Artery: Normal caliber vessel. No significant plaque or stenosis. Small caliber ramus without significant plaque or stenosis. Left Anterior Descending Coronary Artery: Normal caliber vessel. Minimal noncalcified plaque with 1-24% stenosis. Gives rise to large first diagonal branch without significant stenosis. Distal LAD wraps apex Left Circumflex Artery: Normal caliber vessel. Very trivial noncalcified plaque without significant stenosis. Gives rise to large first and second, small third OM branches. Aorta: Normal size, 31 mm at the mid ascending aorta (level of the PA bifurcation) measured double oblique. Trivial aortic atherosclerosis. No dissection seen in visualized portions of the aorta. Aortic Valve: No calcifications. Trileaflet. Other findings: Normal pulmonary vein drainage into the left atrium. Normal left atrial appendage without a thrombus. Appendage  is chicken wing type. Normal size of the pulmonary artery. Normal appearance of the pericardium. There is an apical LV thrombus, dimensions  saves on PACS images. Maximum dimension 15.6 x 11.3 mm There is a small accessory left atrial segment on the cranial aspect of the left atrium. IMPRESSION: 1.  Very minimal nonobstructive CAD, CADRADS = 1. 2. Coronary calcium score of 0. This was 0 percentile for age and sex matched control. 3. Normal coronary origin with right dominance. 4. There is an apical LV thrombus, dimensions saves on PACS images. Maximum dimension 15.6 x 11.3 mm Findings reported to ordering provider. INTERPRETATION: CAD-RADS 1: Minimal non-obstructive CAD (0-24%). Consider non-atherosclerotic causes of chest pain. Consider preventive therapy and risk factor modification. Electronically Signed: By: Buford Dresser M.D. 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 She initially presented on 01/21/2022 with transient episode of aphasia followed sudden onset dizziness, nausea, vomiting, chest pain that radiated to upper back on Saturday. 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 Her troponins were elevated to over 300 and her echocardiogram showed apical hypokinesis and a left ventricular thrombus consistent with NSTEMI. Around 6 p.m. on 01/21/2022, patient developed worsening aphasia, NIHSS 6. Head CT was unremarkable. CT angiogram of the head and neck showed a left M2/M3-MCA occlusion. She was then transferred to our service for an emergency mechanical thrombectomy. EXAM: ULTRASOUND-GUIDED VASCULAR ACCESS DIAGNOSTIC CEREBRAL ANGIOGRAM MECHANICAL THROMBECTOMY FLAT PANEL HEAD CT COMPARISON:  CT/CT angiogram of the head and neck  January 22, 2019 MEDICATIONS: No antibiotic was administered. ANESTHESIA/SEDATION: The procedure was performed under general anesthesia. CONTRAST:  75 mL of Omnipaque 300 milligram/mL. FLUOROSCOPY: Radiation Exposure Index (as provided by the fluoroscopic device): 094.7 mGy Kerma COMPLICATIONS: None immediate. TECHNIQUE: Informed written consent was obtained from the patient after a thorough discussion of the procedural risks, benefits and alternatives. All questions were addressed. Maximal Sterile Barrier Technique was utilized including caps, mask, sterile gowns, sterile gloves, sterile drape, hand hygiene and skin antiseptic. A timeout was performed prior to the initiation of the procedure. The right groin was prepped and draped in the usual sterile fashion. Using a micropuncture kit and the modified Seldinger technique, access was gained to the right common femoral artery and an 8 French sheath was placed. Real-time ultrasound guidance was utilized for vascular access including the acquisition of a permanent ultrasound image documenting patency of the accessed vessel. Under fluoroscopy, a Zoom 88 guide catheter was navigated over a 6 [place] VTK catheter and a 0.035" Terumo Glidewire into the aortic arch. The catheter was placed into the left common carotid artery and then advanced into the left internal carotid artery. The diagnostic catheter was removed. Frontal and lateral angiograms of the head were obtained. FINDINGS: 1. Normal caliber of the right common femoral artery, adequate for vascular access. 2. There is no occlusion of the distal left M2/MCA superior division branch extending to the M3 segment. PROCEDURE: Using biplane roadmap, a Vect 46 aspiration catheter was navigated over an Aristotle 24 microguidewire into the cavernous segment of the left ICA. The aspiration catheter was then advanced over the wire to the level of occlusion in the M2-M3 segment and connected to an aspiration pump. Continuous  aspiration was  performed for 2 minutes. The guide catheter was connected to a VacLok syringe. The aspiration catheter was subsequently removed under constant aspiration. The guide catheter was aspirated for debris. Left internal carotid artery angiograms with magnified frontal and lateral views of the head showed recanalization of the left MCA with slow flow in a few distal cortical branches (TICI 2C). No embolus to new territory. Flat panel CT of the head was obtained and post processed in a separate workstation with concurrent attending physician supervision. Selected images were sent to PACS. No evidence of hemorrhagic complication. Delayed left internal carotid artery angiogram showed persistent patency of the left MCA. Right common femoral artery angiogram was obtained in right anterior oblique view. The puncture is at the level of the common femoral artery. The artery has normal caliber, adequate for closure device. The sheath was exchanged over the wire for a Perclose prostyle which was utilized for access closure. Immediate hemostasis was achieved. IMPRESSION: 1. Successful mechanical thrombectomy for treatment of a distal left M2/MCA occlusion with direct contact aspiration achieving complete recanalization (TICI 2C). 2. No thromboembolic or hemorrhagic complication. PLAN: Transfer to ICU for continued post stroke care. Electronically Signed   By: Pedro Earls M.D.   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 She initially presented on 01/21/2022 with transient episode of aphasia followed sudden onset dizziness, nausea, vomiting, chest pain that radiated to upper back on  Saturday. MRI brain showed small ischemic infarct of the frontal Manning matter on the left. Her troponins were elevated to over 300 and her echocardiogram showed apical hypokinesis and a left ventricular thrombus consistent with NSTEMI. Around 6 p.m. on 01/21/2022, patient developed worsening aphasia, NIHSS 6. Head CT was unremarkable. CT angiogram of the head and neck showed a left M2/M3-MCA occlusion. She was then transferred to our service for an emergency mechanical thrombectomy. EXAM: ULTRASOUND-GUIDED VASCULAR ACCESS DIAGNOSTIC CEREBRAL ANGIOGRAM MECHANICAL THROMBECTOMY FLAT PANEL HEAD CT COMPARISON:  CT/CT angiogram of the head and neck January 22, 2019 MEDICATIONS: No antibiotic was administered. ANESTHESIA/SEDATION: The procedure was performed under general anesthesia. CONTRAST:  75 mL of Omnipaque 300 milligram/mL. FLUOROSCOPY: Radiation Exposure Index (as provided by the fluoroscopic device): 127.5 mGy Kerma COMPLICATIONS: None immediate. TECHNIQUE: Informed written consent was obtained from the patient after a thorough discussion of the procedural risks, benefits and alternatives. All questions were addressed. Maximal Sterile Barrier Technique was utilized including caps, mask, sterile gowns, sterile gloves, sterile drape, hand hygiene and skin antiseptic. A timeout was performed prior to the initiation of the procedure. The right groin was prepped and draped in the usual sterile fashion. Using a micropuncture kit and the modified Seldinger technique, access was gained to the right common femoral artery and an 8 French sheath was placed. Real-time ultrasound guidance was utilized for vascular access including the acquisition of a permanent ultrasound image documenting patency of the accessed vessel. Under fluoroscopy, a Zoom 88 guide catheter was navigated over a 6 [place] VTK catheter and a 0.035" Terumo Glidewire into the aortic arch. The catheter was placed into the left common carotid artery and then  advanced into the left internal carotid artery. The diagnostic catheter was removed. Frontal and lateral angiograms of the head were obtained. FINDINGS: 1. Normal caliber of the right  common femoral artery, adequate for vascular access. 2. There is no occlusion of the distal left M2/MCA superior division branch extending to the M3 segment. PROCEDURE: Using biplane roadmap, a Vect 46 aspiration catheter was navigated over an Aristotle 24 microguidewire into the cavernous segment of the left ICA. The aspiration catheter was then advanced over the wire to the level of occlusion in the M2-M3 segment and connected to an aspiration pump. Continuous aspiration was performed for 2 minutes. The guide catheter was connected to a VacLok syringe. The aspiration catheter was subsequently removed under constant aspiration. The guide catheter was aspirated for debris. Left internal carotid artery angiograms with magnified frontal and lateral views of the head showed recanalization of the left MCA with slow flow in a few distal cortical branches (TICI 2C). No embolus to new territory. Flat panel CT of the head was obtained and post processed in a separate workstation with concurrent attending physician supervision. Selected images were sent to PACS. No evidence of hemorrhagic complication. Delayed left internal carotid artery angiogram showed persistent patency of the left MCA. Right common femoral artery angiogram was obtained in right anterior oblique view. The puncture is at the level of the common femoral artery. The artery has normal caliber, adequate for closure device. The sheath was exchanged over the wire for a Perclose prostyle which was utilized for access closure. Immediate hemostasis was achieved. IMPRESSION: 1. Successful mechanical thrombectomy for treatment of a distal left M2/MCA occlusion with direct contact aspiration achieving complete recanalization (TICI 2C). 2. No thromboembolic or hemorrhagic complication.  PLAN: Transfer to ICU for continued post stroke care. Electronically Signed   By: Pedro Earls M.D.   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 She initially presented on 01/21/2022 with transient episode of aphasia followed sudden onset dizziness, nausea, vomiting, chest pain that radiated to upper back on Saturday. 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 Her troponins were elevated to over 300 and her echocardiogram showed apical hypokinesis and a left ventricular thrombus consistent with NSTEMI. Around 6 p.m. on 01/21/2022, patient developed worsening aphasia, NIHSS 6. Head CT was unremarkable. CT angiogram of the head and neck showed a left M2/M3-MCA occlusion. She was then transferred to our service for an emergency mechanical thrombectomy. EXAM: ULTRASOUND-GUIDED VASCULAR ACCESS DIAGNOSTIC CEREBRAL ANGIOGRAM MECHANICAL THROMBECTOMY FLAT PANEL HEAD CT COMPARISON:  CT/CT angiogram of the head and neck January 22, 2019 MEDICATIONS: No antibiotic was administered. ANESTHESIA/SEDATION: The procedure was performed under general anesthesia. CONTRAST:  75 mL of Omnipaque 300 milligram/mL. FLUOROSCOPY: Radiation Exposure Index (as provided by the fluoroscopic device): 409.8 mGy Kerma COMPLICATIONS: None immediate. TECHNIQUE: Informed written consent was obtained from the patient after a thorough discussion of the procedural risks, benefits and alternatives. All questions were addressed. Maximal Sterile Barrier Technique was utilized including caps, mask, sterile gowns, sterile gloves, sterile drape, hand hygiene and skin antiseptic. A  timeout was performed prior to the initiation of the procedure. The right groin was prepped and draped in the usual sterile fashion. Using a micropuncture kit and the modified Seldinger technique, access was gained to the right common femoral artery and an 8 French sheath was placed. Real-time ultrasound guidance was utilized for vascular access  including the acquisition of a permanent ultrasound image documenting patency of the accessed vessel. Under fluoroscopy, a Zoom 88 guide catheter was navigated over a 6 [place] VTK catheter and a 0.035" Terumo Glidewire into the aortic arch. The catheter was placed into the left common carotid artery and then advanced into the left internal carotid artery. The diagnostic catheter was removed. Frontal and lateral angiograms of the head were obtained. FINDINGS: 1. Normal caliber of the right common femoral artery, adequate for vascular access. 2. There is no occlusion of the distal left M2/MCA superior division branch extending to the M3 segment. PROCEDURE: Using biplane roadmap, a Vect 46 aspiration catheter was navigated over an Aristotle 24 microguidewire into the cavernous segment of the left ICA. The aspiration catheter was then advanced over the wire to the level of occlusion in the M2-M3 segment and connected to an aspiration pump. Continuous aspiration was performed for 2 minutes. The guide catheter was connected to a VacLok syringe. The aspiration catheter was subsequently removed under constant aspiration. The guide catheter was aspirated for debris. Left internal carotid artery angiograms with magnified frontal and lateral views of the head showed recanalization of the left MCA with slow flow in a few distal cortical branches (TICI 2C). No embolus to new territory. Flat panel CT of the head was obtained and post processed in a separate workstation with concurrent attending physician supervision. Selected images were sent to PACS. No evidence of hemorrhagic  complication. Delayed left internal carotid artery angiogram showed persistent patency of the left MCA. Right common femoral artery angiogram was obtained in right anterior oblique view. The puncture is at the level of the common femoral artery. The artery has normal caliber, adequate for closure device. The sheath was exchanged over the wire for a Perclose prostyle which was utilized for access closure. Immediate hemostasis was achieved. IMPRESSION: 1. Successful mechanical thrombectomy for treatment of a distal left M2/MCA occlusion with direct contact aspiration achieving complete recanalization (TICI 2C). 2. No thromboembolic or hemorrhagic complication. PLAN: Transfer to ICU for continued post stroke care. Electronically Signed   By: Pedro Earls M.D.   On: 01/22/2022 14:25   MR BRAIN WO CONTRAST  Result Date: 01/22/2022 CLINICAL DATA:  Follow-up stroke EXAM: MRI HEAD WITHOUT CONTRAST TECHNIQUE: Multiplanar, multiecho pulse sequences of the brain and surrounding structures were obtained without intravenous contrast. COMPARISON:  Brain MRI and CT/CTA head and neck 1 day prior FINDINGS: Brain: A punctate fusion of diffusion restriction in the left frontal lobe periventricular Gruber matter is unchanged compared to the study from 1 day prior. Additional small foci of diffusion restriction in the left external capsule, insula, and frontal lobe cortex are new. There is no associated hemorrhage or mass effect. Parenchymal volume is normal. The ventricles are normal in size. Parenchymal signal is otherwise normal, with no significant burden of under vitamin chronic small vessel ischemic change. There is no mass lesion.  There is no mass effect or midline shift. Vascular: Normal flow voids. Skull and upper cervical spine: Normal marrow signal. Sinuses/Orbits: The paranasal sinuses are clear. The globes and orbits are unremarkable. Other: None. IMPRESSION: New punctate acute infarcts in the left  external capsule, insula, and frontal lobe cortex, and unchanged additional punctate infarct in the left frontal lobe Haddix matter. Electronically Signed   By: Valetta Mole M.D.   On: 01/22/2022 11:39   CT CEREBRAL PERFUSION W CONTRAST  Result Date: 01/21/2022 CLINICAL DATA:  Neuro deficit, acute, stroke suspected. EXAM: CT  ANGIOGRAPHY HEAD AND NECK CT PERFUSION BRAIN TECHNIQUE: Multidetector CT imaging of the head and neck was performed using the standard protocol during bolus administration of intravenous contrast. Multiplanar CT image reconstructions and MIPs were obtained to evaluate the vascular anatomy. Carotid stenosis measurements (when applicable) are obtained utilizing NASCET criteria, using the distal internal carotid diameter as the denominator. Multiphase CT imaging of the brain was performed following IV bolus contrast injection. Subsequent parametric perfusion maps were calculated using RAPID software. RADIATION DOSE REDUCTION: This exam was performed according to the departmental dose-optimization program which includes automated exposure control, adjustment of the mA and/or kV according to patient size and/or use of iterative reconstruction technique. CONTRAST:  126mL OMNIPAQUE IOHEXOL 350 MG/ML SOLN COMPARISON:  Brain MRI 01/21/2022. Noncontrast head CT performed earlier today 01/21/2022. FINDINGS: CTA NECK FINDINGS Aortic arch: Standard aortic branching. Mild atherosclerotic plaque within the visualized aortic arch and proximal major branch vessels of the neck. Streak and beam hardening artifact arising from a dense right-sided contrast bolus partially obscures the right subclavian artery. Within this limitation, there is no appreciable hemodynamically significant innominate or proximal subclavian artery stenosis. Right carotid system: CCA and ICA patent within the neck without stenosis. Mild atherosclerotic plaque about the carotid bifurcation. Left carotid system: CCA and ICA patent within  the neck without stenosis. Minimal atherosclerotic plaque about the carotid bifurcation. Vertebral arteries: Vertebral arteries codominant and patent within the neck without stenosis. Nonstenotic atherosclerotic plaque at the origin of the right vertebral artery. Skeleton: Cervical spondylosis. No acute fracture or aggressive osseous lesion. Other neck: No neck mass or cervical lymphadenopathy. Upper chest: No consolidation within the imaged lung apices. Review of the MIP images confirms the above findings CTA HEAD FINDINGS Anterior circulation: The intracranial internal carotid arteries are patent. The M1 middle cerebral arteries are patent. Occluded mid M2 left MCA vessel (with distal reconstitution, likely due to collateral flow) (for instance as seen on series 15, image 15). The anterior cerebral arteries are patent. No intracranial aneurysm is identified. Posterior circulation: The intracranial vertebral arteries are patent. The basilar artery is patent. The posterior cerebral arteries are patent. Posterior communicating arteries are diminutive or absent, bilaterally. Venous sinuses: Within the limitations of contrast timing, no convincing thrombus. Anatomic variants: As described. Review of the MIP images confirms the above findings CT Brain Perfusion Findings: CBF (<30%) Volume: 56mL Perfusion (Tmax>6.0s) volume: 94mL Mismatch Volume: 93mL Infarction Location:None identified CTA head impression and CT perfusion head impression called by telephone at the time of interpretation on 01/21/2022 at 6:50 pm to provider MCNE[facility] , who verbally acknowledged these results. IMPRESSION: CTA neck: 1. The common carotid, internal carotid and vertebral arteries are patent within the neck without stenosis. Minimal non-stenotic atherosclerotic plaque about the carotid bifurcations and at the origin of the right vertebral artery. 2.  Aortic Atherosclerosis (ICD10-I70.0). CTA head: Occluded mid M2 left MCA vessel (with  distal reconstitution, likely due to collateral flow). CT perfusion head: The perfusion software identifies a 7 mL region of critically hypoperfused parenchyma within the left insula/frontal lobe (utilizing the Tmax>6 seconds threshold). No core infarct is identified. Reported mismatch volume: 7 mL. Electronically Signed   By: Kellie Simmering D.O.   On: 01/21/2022 19:09   CT ANGIO HEAD NECK W WO CM (CODE STROKE)  Result Date: 01/21/2022 CLINICAL DATA:  Neuro deficit, acute, stroke suspected. EXAM: CT ANGIOGRAPHY HEAD AND NECK CT PERFUSION BRAIN TECHNIQUE: Multidetector CT imaging of the head and neck was performed using the standard protocol during  bolus administration of intravenous contrast. Multiplanar CT image reconstructions and MIPs were obtained to evaluate the vascular anatomy. Carotid stenosis measurements (when applicable) are obtained utilizing NASCET criteria, using the distal internal carotid diameter as the denominator. Multiphase CT imaging of the brain was performed following IV bolus contrast injection. Subsequent parametric perfusion maps were calculated using RAPID software. RADIATION DOSE REDUCTION: This exam was performed according to the departmental dose-optimization program which includes automated exposure control, adjustment of the mA and/or kV according to patient size and/or use of iterative reconstruction technique. CONTRAST:  147mL OMNIPAQUE IOHEXOL 350 MG/ML SOLN COMPARISON:  Brain MRI 01/21/2022. Noncontrast head CT performed earlier today 01/21/2022. FINDINGS: CTA NECK FINDINGS Aortic arch: Standard aortic branching. Mild atherosclerotic plaque within the visualized aortic arch and proximal major branch vessels of the neck. Streak and beam hardening artifact arising from a dense right-sided contrast bolus partially obscures the right subclavian artery. Within this limitation, there is no appreciable hemodynamically significant innominate or proximal subclavian artery stenosis. Right  carotid system: CCA and ICA patent within the neck without stenosis. Mild atherosclerotic plaque about the carotid bifurcation. Left carotid system: CCA and ICA patent within the neck without stenosis. Minimal atherosclerotic plaque about the carotid bifurcation. Vertebral arteries: Vertebral arteries codominant and patent within the neck without stenosis. Nonstenotic atherosclerotic plaque at the origin of the right vertebral artery. Skeleton: Cervical spondylosis. No acute fracture or aggressive osseous lesion. Other neck: No neck mass or cervical lymphadenopathy. Upper chest: No consolidation within the imaged lung apices. Review of the MIP images confirms the above findings CTA HEAD FINDINGS Anterior circulation: The intracranial internal carotid arteries are patent. The M1 middle cerebral arteries are patent. Occluded mid M2 left MCA vessel (with distal reconstitution, likely due to collateral flow) (for instance as seen on series 15, image 15). The anterior cerebral arteries are patent. No intracranial aneurysm is identified. Posterior circulation: The intracranial vertebral arteries are patent. The basilar artery is patent. The posterior cerebral arteries are patent. Posterior communicating arteries are diminutive or absent, bilaterally. Venous sinuses: Within the limitations of contrast timing, no convincing thrombus. Anatomic variants: As described. Review of the MIP images confirms the above findings CT Brain Perfusion Findings: CBF (<30%) Volume: 91mL Perfusion (Tmax>6.0s) volume: 97mL Mismatch Volume: 80mL Infarction Location:None identified CTA head impression and CT perfusion head impression called by telephone at the time of interpretation on 01/21/2022 at 6:50 pm to provider [facility] , who verbally acknowledged these results. IMPRESSION: CTA neck: 1. The common carotid, internal carotid and vertebral arteries are patent within the neck without stenosis. Minimal non-stenotic atherosclerotic  plaque about the carotid bifurcations and at the origin of the right vertebral artery. 2.  Aortic Atherosclerosis (ICD10-I70.0). CTA head: Occluded mid M2 left MCA vessel (with distal reconstitution, likely due to collateral flow). CT perfusion head: The perfusion software identifies a 7 mL region of critically hypoperfused parenchyma within the left insula/frontal lobe (utilizing the Tmax>6 seconds threshold). No core infarct is identified. Reported mismatch volume: 7 mL. Electronically Signed   By: Kellie Simmering D.O.   On: 01/21/2022 19:09   CT HEAD CODE STROKE WO CONTRAST  Result Date: 01/21/2022 CLINICAL DATA:  Code stroke. Neuro deficit, acute, stroke suspected. EXAM: CT HEAD WITHOUT CONTRAST TECHNIQUE: Contiguous axial images were obtained from the base of the skull through the vertex without intravenous contrast. RADIATION DOSE REDUCTION: This exam was performed according to the departmental dose-optimization program which includes automated exposure control, adjustment of the mA and/or kV according to  patient size and/or use of iterative reconstruction technique. COMPARISON:  Same-day brain MRI 01/21/2022. FINDINGS: Brain: No age advanced or lobar predominant parenchymal atrophy. A subcentimeter acute infarct within the left frontal lobe [place] matter (adjacent to the left lateral ventricle frontal horn) is occult by CT and was better appreciated on the brain MRI performed earlier today. No CT evidence of interval acute infarct. Minimal chronic small-vessel image changes within the cerebral [place] matter and pons, better appreciated on the prior brain MRI. There is no acute intracranial hemorrhage. No extra-axial fluid collection. No evidence of an intracranial mass. No midline shift. Vascular: No hyperdense vessel. Skull: No fracture or aggressive osseous lesion. Sinuses/Orbits: No mass or acute finding within the imaged orbits. Trace mucosal thickening within the anterior left ethmoid air cells. ASPECTS  ([place] Stroke Program Early CT Score) - Ganglionic level infarction (caudate, lentiform nuclei, internal capsule, insula, M1-M3 cortex): 7 - Supraganglionic infarction (M4-M6 cortex): 3 Total score (0-10 with 10 being normal): 10 These results were communicated to Dr. Leonel Ramsay at 6:27 pmon 8/29/2023by text page via the [facility] messaging system. IMPRESSION: A known subcentimeter acute infarct within the left frontal lobe [place] matter is occult by CT, and was better appreciated on the brain MRI performed earlier today. No CT evidence of interval acute intracranial abnormality. Minimal chronic small-vessel ischemic changes within the cerebral [place] matter and pons. Electronically Signed   By: Kellie Simmering D.O.   On: 01/21/2022 18:27   ECHOCARDIOGRAM COMPLETE  Result Date: 01/21/2022    ECHOCARDIOGRAM REPORT   Patient Name:   TOMESHIA PIZZI Rauls Date of Exam: 01/21/2022 Medical Rec #:  [id number]    Height:       61.0 in Accession #:    [id number]   Weight:       171.0 lb Date of Birth:  [birth year]    BSA:          1.767 m Patient Age:    36 years     BP:           146/125 mmHg Patient Gender: F            HR:           72 bpm. Exam Location:  Inpatient Procedure: 2D Echo, Color Doppler and Cardiac Doppler Indications:    Stroke i63.9  History:        Patient has no prior history of Echocardiogram examinations.                 Risk Factors:Hypertension.  Sonographer:    Raquel Sarna Senior RDCS Referring Phys: [place]  1. Left ventricular ejection fraction, by estimation, is 45 to 50%. The left ventricle has mildly decreased function. The left ventricle demonstrates regional wall motion abnormalities (apical hypokinesis with out true aneurysm and with presence of an LV thrombus 1.84 X 0.9 X 1.2 cm).  2. The mitral valve is grossly normal. No evidence of mitral valve regurgitation. No evidence of mitral stenosis.  3. The aortic valve is tricuspid. Aortic valve regurgitation is not visualized. No aortic  stenosis is present.  4. Right ventricular systolic function is normal. The right ventricular size is normal. Tricuspid regurgitation signal is inadequate for assessing PA pressure.  5. The inferior vena cava is normal in size with greater than 50% respiratory variability, suggesting right atrial pressure of 3 mmHg. Comparison(s): No prior Echocardiogram. 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 Cardiology aware. FINDINGS  Left Ventricle: Left ventricular ejection  fraction, by estimation, is 45 to 50%. The left ventricle has mildly decreased function. The left ventricle demonstrates regional wall motion abnormalities. The left ventricular internal cavity size was small. There is no left ventricular hypertrophy. Left ventricular diastolic parameters are consistent with Grade I diastolic dysfunction (impaired relaxation).  LV Wall Scoring: The apical lateral segment, apical septal segment, apical anterior segment, and apical inferior segment are hypokinetic. Right Ventricle: The right ventricular size is normal. No increase in right ventricular wall thickness. Right ventricular systolic function is normal. Tricuspid regurgitation signal is inadequate for assessing PA pressure. Left Atrium: Left atrial size was normal in size. Right Atrium: Right atrial size was normal in size. Pericardium: There is no evidence of pericardial effusion. Mitral Valve: The mitral valve is grossly normal. No evidence of mitral valve regurgitation. No evidence of mitral valve stenosis. Tricuspid Valve: The tricuspid valve is normal in structure. Tricuspid valve regurgitation is not demonstrated. No evidence of tricuspid stenosis. Aortic Valve: The aortic valve is tricuspid. Aortic valve regurgitation is not visualized. No aortic stenosis is present. Pulmonic Valve: The pulmonic valve was normal in structure. Pulmonic valve regurgitation is not visualized. No evidence of pulmonic stenosis. Aorta: The  aortic root and ascending aorta are structurally normal, with no evidence of dilitation. Venous: The inferior vena cava is normal in size with greater than 50% respiratory variability, suggesting right atrial pressure of 3 mmHg. IAS/Shunts: No atrial level shunt detected by color flow Doppler.  LEFT VENTRICLE PLAX 2D LVIDd:         3.60 cm     Diastology LVIDs:         2.50 cm     LV e' medial:    6.68 cm/s LV PW:         0.80 cm     LV E/e' medial:  6.5 LV IVS:        0.80 cm     LV e' lateral:   11.00 cm/s LVOT diam:     1.70 cm     LV E/e' lateral: 3.9 LV SV:         42 LV SV Index:   24 LVOT Area:     2.27 cm  LV Volumes (MOD) LV vol d, MOD A2C: 70.1 ml LV vol d, MOD A4C: 46.6 ml LV vol s, MOD A2C: 37.3 ml LV vol s, MOD A4C: 23.3 ml LV SV MOD A2C:     32.8 ml LV SV MOD A4C:     46.6 ml LV SV MOD BP:      28.7 ml RIGHT VENTRICLE RV S prime:     9.32 cm/s TAPSE (M-mode): 1.7 cm LEFT ATRIUM             Index        RIGHT ATRIUM          Index LA diam:        2.70 cm 1.53 cm/m   RA Area:     9.52 cm LA Vol (A2C):   28.1 ml 15.90 ml/m  RA Volume:   20.50 ml 11.60 ml/m LA Vol (A4C):   26.3 ml 14.88 ml/m LA Biplane Vol: 28.8 ml 16.30 ml/m  AORTIC VALVE LVOT Vmax:   115.00 cm/s LVOT Vmean:  68.700 cm/s LVOT VTI:    0.185 m  AORTA Ao Root diam: 2.90 cm Ao Asc diam:  3.20 cm MITRAL VALVE MV Area (PHT): 3.11 cm    SHUNTS MV Decel Time: 244 msec  Systemic VTI:  0.18 m MV E velocity: 43.20 cm/s  Systemic Diam: 1.70 cm MV A velocity: 64.50 cm/s MV E/A ratio:  0.67 Rudean Haskell MD Electronically signed by Rudean Haskell MD Signature Date/Time: 01/21/2022/4:04:02 PM    Final    MR BRAIN WO CONTRAST  Result Date: 01/21/2022 CLINICAL DATA:  TIA.  Sudden onset of aphasia, now resolved. EXAM: MRI HEAD WITHOUT CONTRAST TECHNIQUE: Multiplanar, multiecho pulse sequences of the brain and surrounding structures were obtained without intravenous contrast. COMPARISON:  None Available. FINDINGS: Brain: Small  acute infarct in the left frontal [place] matter. No hemorrhage, hydrocephalus, mass, or atrophy. Minimal chronic small vessel changes may be present in the pons. No prior infarct is seen. Vascular: Major flow voids are preserved Skull and upper cervical spine: Normal marrow signal. Degenerative facet spurring where covered in the cervical spine with mild C2-3 anterolisthesis. Sinuses/Orbits: Negative IMPRESSION: Small acute infarct in the left frontal [place] matter. Electronically Signed   By: Jorje Guild M.D.   On: 01/21/2022 12:32     PHYSICAL EXAM  Temp:  [97.8 F (36.6 C)-98.8 F (37.1 C)] 98.2 F (36.8 C) (09/07 0800) Pulse Rate:  [71-101] 75 (09/07 1000) Resp:  [10-30] 18 (09/07 1000) BP: (111-157)/(68-89) 148/87 (09/07 1200) SpO2:  [95 %-100 %] 99 % (09/07 1000)  General - Well nourished, well developed, in no apparent distress.  Ophthalmologic - fundi not visualized due to noncooperation.  Cardiovascular - Regular rhythm and rate.  Neuro - awake, alert, eyes open, expressive aphasia, able to make sounds out but not meaningful words, following most simple commands.  Not able to name and repeat. No gaze palsy, tracking bilaterally, blinking to visual threat bilaterally.  Right mild facial droop. Tongue protrusion difficulty. LUE 5/5, no drift, RUE 3/5 proximal and 0/5 distally. Bilaterally LEs 5/5, no drift. Sensation symmetrical bilaterally subjectively, left FTN intact, gait not tested.    ASSESSMENT/PLAN Ms. MISHAAL LANSDALE is a 66 y.o. 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 No tPA given due to on Eliquis and recent stroke.    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 Will switch to coumadin with lovenox bridge.  Ongoing aggressive stroke risk factor management Therapy recommendations:  CIR Disposition:  pending  Respiratory failure Intubated for IR Remains intubated due to mental status Extubated 01/29/2022 Tolerating well Appreciate CCM assistance  Hx of stroke and LV thrombus 8/29 admitted for nausea vomiting dizziness chest pain and then developed right facial droop and aphasia.  Found to have troponin > 300, EF 45 to 50% with LV thrombus.  CTA coronary artery showed minimal CAD.  CT no acute abnormality.  CTA head and neck showed left M2 occlusion.  CTP 0/7.  Status post EVT with TICI2c.  MRI  showed left frontal [place] matter punctate infarct.  Repeat MRI showed left external capsule, insular cortex and frontal cortex punctate infarcts.  LDL 150, A1c 5.0.  UDS positive for THC.  Patient was put on heparin IV and aspirin and eventually discharged on Eliquis and aspirin as well as Crestor 20. Per family, patient has slight expressive aphasia since discharge, no physical deficit. Cardiology Dr. Johnsie Cancel on board, recommend coumadin with INR goal 2-3. Will do lovenox bridge.   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 This patient's care requires constant monitoring of vital signs, hemodynamics, respiratory and cardiac monitoring, review of multiple databases, neurological assessment, discussion with family, other specialists and medical decision making of high complexity. I spent 35 minutes of neurocritical care time in the care of this patient. I had long discussion with husband and 2 daughters at bedside, updated pt current condition, treatment plan and potential prognosis, and answered all the questions.  They expressed understanding and appreciation.    Rosalin Hawking, MD PhD Stroke Neurology 01/30/2022 1:00 PM    To contact Stroke Continuity provider, please refer to [email or web address] After hours, contact General Neurology

## 2022-01-30 NOTE — Progress Notes (Signed)
Modified Barium Swallow Progress Note  Patient Details  Name: Chloe Johnson MRN: [id number] Date of Birth: 1955/07/22  Today's Date: 01/30/2022  Modified Barium Swallow completed.  Full report located under Chart Review in the Imaging Section.  Brief recommendations include the following:  Clinical Impression  Pt demonstrates oropharygneal dysphagia with mild oral impairment secondary to right CN VII and XII weakness and potential right sided sensory changes. Pt has decreased labial seal on the right with anterior spillage, improved with cues to increase labial tension with straw or cup. There is also decreased bolus cohesion and mild lingual residue unless cued for an oral hold. Pt initiates swallow as the bolus arrives past the valleculae resulting in instances of flash penetration or sensed aspiration (consecutive straw sips). Pt improved with single sips and oral hold prior to swallow. There was questionable trace silent aspiration of light barium pooled in pyriforms; penetrated post swallow. However this finding is inconclusive and  likely negligible given pts sensation and ejection of mild aspirate in prior event. Recommend initaiting nectar thick liquids and dys 3 solids to aid in pt relearning swallowing motor patterns with a slower, more manageable texture prior to advancement to thin in 1-2 days if successful.   Swallow Evaluation Recommendations       SLP Diet Recommendations: Nectar thick liquid;Thin liquid;Dysphagia 3 (Mech soft) solids   Liquid Administration via: Cup;Straw   Medication Administration: Whole meds with puree   Supervision: Staff to assist with self feeding   Compensations: Slow rate;Small sips/bites;Clear throat intermittently (tight oral hold)   Postural Changes: Remain semi-upright after after feeds/meals (Comment);Seated upright at 90 degrees   Oral Care Recommendations: Oral care BID   Other Recommendations: Have oral suction available    Jaimen Melone,  Riley Nearing 01/30/2022,1:47 PM

## 2022-01-30 NOTE — Progress Notes (Signed)
Physical Therapy Treatment & Discharge Patient Details Name: Chloe Johnson MRN: [id number] DOB: 1955-07-13 Today's Date: 01/30/2022   History of Present Illness Pt is a 66 y.o. female who presented 01/28/22 with weakness and aphasia. S/p complete revascularization of occluded superior division of the left middle cerebral artery M2 segment with 1 pass with a 3 mm x 40 mm treatment retrieval device and proximal aspiration achieving aTICI 2C revascularization 9/5. ETT 9/5-9/6. MRI revealed new acute infarcts in the left posterior frontal and anterior parietal cortex. Of note, pt admitted 01/21/22-01/23/22 for small acute infarct in the left frontal Cardinal matter. PMH: HTN, cervical radiculopathy, hepatitis, osteomyelitis/discitis, lumbar stenosis with foot drop anxiety, GERD, insomnia    PT Comments    Pt has demonstrated safe functional mobility without UE support, LOB, or assistance. This is supported by her DGI score of 24/24 today. However, she continues to display deficits in expressive communication and R UE strength. She is now demonstrating a Modified Ashworth Scale score of +1 in her R biceps, indicating spasticity and resulting in her holding her R elbow in flexion the majority of the time. Performed and educated pt on self-stretches to her R biceps to reduce risk of contractures. Focused remainder of session on trying to stimulate R UE muscle activation with PNF techniques and weight bearing exercises, but limited muscle activation noted only in her shoulder. As pt is independent with functional mobility and all education is completed with questions answered, PT will sign off. However, she would greatly benefit from intensive therapy at the [facility] setting for OT and speech therapy.     Recommendations for follow up therapy are one component of a multi-disciplinary discharge planning process, led by the attending physician.  Recommendations may be updated based on patient status, additional functional  criteria and insurance authorization.  Follow Up Recommendations  No PT follow up ([facility] for OT and Speech therapy)     Assistance Recommended at Discharge Intermittent Supervision/Assistance  Patient can return home with the following Assist for transportation;Direct supervision/assist for medications management;Direct supervision/assist for financial management;A little help with bathing/dressing/bathroom;Assistance with cooking/housework   Equipment Recommendations  None recommended by PT    Recommendations for Other Services Rehab consult     Precautions / Restrictions Precautions Precautions: Other (comment) Precaution Comments: SBP goal < 160 Restrictions Weight Bearing Restrictions: No     Mobility  Bed Mobility Overal bed mobility: Modified Independent Bed Mobility: Supine to Sit, Sit to Supine     Supine to sit: Modified independent (Device/Increase time), HOB elevated Sit to supine: Modified independent (Device/Increase time), HOB elevated   General bed mobility comments: HOB elevated, no assistance needed    Transfers Overall transfer level: Needs assistance Equipment used: None Transfers: Sit to/from Stand Sit to Stand: Supervision           General transfer comment: Supervision for safety, no LOB    Ambulation/Gait Ambulation/Gait assistance: Modified independent (Device/Increase time), Supervision Gait Distance (Feet): 160 Feet Assistive device: None Gait Pattern/deviations: WFL(Within Functional Limits) Gait velocity: reduced Gait velocity interpretation: >4.37 ft/sec, indicative of normal walking speed   General Gait Details: Pt ambulating at a functional pace without LOB, even with DGI challenges. Progressed from supervision initially for safety to mod I. Holds R UE up in elbow flexion and close to abdomen due to biceps spasticity.   Stairs Stairs: Yes Stairs assistance: Supervision Stair Management: No rails, Alternating pattern,  Forwards Number of Stairs: 3 General stair comments: Ascends and descends stairs without LOB,  supervision for safety   Wheelchair Mobility    Modified Rankin (Stroke Patients Only) Modified Rankin (Stroke Patients Only) Pre-Morbid Rankin Score: No symptoms Modified Rankin: Moderate disability     Balance Overall balance assessment: Needs assistance Sitting-balance support: No upper extremity supported, Feet supported Sitting balance-Leahy Scale: Normal     Standing balance support: No upper extremity supported, During functional activity Standing balance-Leahy Scale: Normal Standing balance comment: No LOB when challenging dynamic gait balance                 Standardized Balance Assessment Standardized Balance Assessment : Dynamic Gait Index   Dynamic Gait Index Level Surface: Normal Change in Gait Speed: Normal Gait with Horizontal Head Turns: Normal Gait with Vertical Head Turns: Normal Gait and Pivot Turn: Normal Step Over Obstacle: Normal Step Around Obstacles: Normal Steps: Normal Total Score: 24      Cognition Arousal/Alertness: Awake/alert Behavior During Therapy:  (appears upset and frustrated) Overall Cognitive Status: Difficult to assess                                 General Comments: Pt with expressive communication deficits, limiting cognitive assessment some. Pt follows commands appropriately. Did not nod "yes" too early and then change her mind like she did yesterday.        Exercises Other Exercises Other Exercises: Propping onto R elbow and pushing up wiht R hand while seated EOB, recieving tactile cues at triceps and support for shoulder, 0/5 noted in triceps Other Exercises: PROM into R elbow extension, >10x, educating pt how to perform with her L UE Other Exercises: AAROM R elbow flexion/extension 10x sitting EOB Other Exercises: AAROM PNF D1 and D2 flexion/extension with R UE sitting EOB 10x each    General Comments  General comments (skin integrity, edema, etc.): Educated pt and family on AAROM/PROM to increase R UE muscular activation and decrease biceps tone/risk of contracture      Pertinent Vitals/Pain Pain Assessment Pain Assessment: Faces Faces Pain Scale: No hurt Pain Intervention(s): Monitored during session    Home Living                          Prior Function            PT Goals (current goals can now be found in the care plan section) Acute Rehab PT Goals Patient Stated Goal: to improve per family PT Goal Formulation: All assessment and education complete, DC therapy Time For Goal Achievement: 02/12/22 Potential to Achieve Goals: Good Progress towards PT goals: Goals met/education completed, patient discharged from PT    Frequency    Min 3X/week      PT Plan Current plan remains appropriate    Co-evaluation              AM-PAC PT "6 Clicks" Mobility   Outcome Measure  Help needed turning from your back to your side while in a flat bed without using bedrails?: None Help needed moving from lying on your back to sitting on the side of a flat bed without using bedrails?: None Help needed moving to and from a bed to a chair (including a wheelchair)?: None Help needed standing up from a chair using your arms (e.g., wheelchair or bedside chair)?: None Help needed to walk in hospital room?: None Help needed climbing 3-5 steps with a railing? : A Little 6 Click Score:  23    End of Session Equipment Utilized During Treatment: Gait belt Activity Tolerance: Patient tolerated treatment well Patient left: with call bell/phone within reach;in bed;with family/visitor present;Other (comment) (with transport present) Nurse Communication: Mobility status PT Visit Diagnosis: Other symptoms and signs involving the nervous system (R29.898);Unsteadiness on feet (R26.81);Other abnormalities of gait and mobility (R26.89);Difficulty in walking, not elsewhere classified  (R26.2);Muscle weakness (generalized) (M62.81)     Time: 0266-9167 PT Time Calculation (min) (ACUTE ONLY): 19 min  Charges:  $Gait Training: 8-22 mins                     Moishe Spice, PT, DPT Acute Rehabilitation Services  Office: [phone]    Orvan Falconer 01/30/2022, 12:31 PM

## 2022-01-30 NOTE — Progress Notes (Signed)
eLink Physician-Brief Progress Note Patient Name: Chloe Johnson DOB: [birth year] MRN: [id number]   Date of Service  01/30/2022  HPI/Events of Note  K+ 2.9, Cr 0.65.  eICU Interventions  KCL 40 meq PO Q 4 hours x 2 doses ordered.     Intervention Category Major Interventions: Electrolyte abnormality - evaluation and management  Migdalia Dk 01/30/2022, 6:51 AM

## 2022-01-30 NOTE — Evaluation (Signed)
Clinical/Bedside Swallow Evaluation Patient Details  Name: Chloe Johnson MRN: [id number] Date of Birth: Oct 16, 1955  Today's Date: 01/30/2022 Time: SLP Start Time (ACUTE ONLY): 1001 SLP Stop Time (ACUTE ONLY): 1026 SLP Time Calculation (min) (ACUTE ONLY): 25 min  Past Medical History:  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/16/2013   L3-4/notes 11/24/2013, arms, neck   Exertional asthma    GERD (gastroesophageal reflux disease)    Hypertension    Kidney stones    "have always passed them"   Neck pain    Osteomyelitis (HCC) 11/23/2013   osteomyelitis, discitis    Sleep concern    uses Prozac for sleep    Past Surgical History:  Past Surgical History:  Procedure Laterality Date   BREAST CYST EXCISION Left 2010   "polypectomy"   IR CT HEAD LTD  01/21/2022   IR CT HEAD LTD  01/28/2022   IR PERCUTANEOUS ART THROMBECTOMY/INFUSION INTRACRANIAL INC DIAG ANGIO  01/21/2022   IR PERCUTANEOUS ART THROMBECTOMY/INFUSION INTRACRANIAL INC DIAG ANGIO  01/28/2022   IR US GUIDE VASC ACCESS RIGHT  01/22/2022   PICC LINE PLACE PERIPHERAL ([facility] HX) Right    for use of Levaquin & Vancomycin, of note: she reports that she had a "flulike feeling"    RADIOLOGY WITH ANESTHESIA N/A 01/21/2022   Procedure: IR WITH ANESTHESIA;  Surgeon: Julieanne Cotton, MD;  Location: [facility] OR;  Service: Radiology;  Laterality: N/A;   RADIOLOGY WITH ANESTHESIA N/A 01/28/2022   Procedure: IR WITH ANESTHESIA;  Surgeon: Radiologist, Medication, MD;  Location: [facility] OR;  Service: Radiology;  Laterality: N/A;   TONSILLECTOMY AND ADENOIDECTOMY  1975   TUBAL LIGATION  1999   HPI:  Patient is a 66 y.o. 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 On 01/25/22, patient began to have symptoms of dizziness followed by nausea and  vomiting. Symptoms improved but then on 9/5 but she felt generally weak and was having trouble finding words. Husband observed right facial droop. She presented to [facility] ED as code stroke on 01/28/22; CT head without acute abnormality but CTA unable to be performed due to IV infiltration. She was taken directly to IR for thrombectomy and intubated for this procedure, extubated in morning of 01/29/22. MRI brain showed new acute infarcts in left posterior frontal and anterior parietal cortex.    Assessment / Plan / Recommendation  Clinical Impression   Pt was alert and attentive throughout session. She continues to present with right sided oral motor flaccidity and potential verbal apraxia.Throughout encounter she displayed reduced right-sided, lingual ROM, labial seal/movement, and facial sensation indicating potential involvement of CN VII/XII. However, facial and upper extremity motor movement has shown a substantial improvement from previous encounter where pt followed minimal commands. Pt given trials of ice chips, thin liquids, puree and regular consistency. She displayed right-sided anterior spillage with thin liquids, and right sided buccal pocketing with regular consistency. Further indicating right sided oral motor flaccidity/inattentiveness. Due to the noted oral motor deficits an instrumental swallow evaluation scheduled to evaluate swallow function objectively.    SLP Visit Diagnosis: Apraxia (R48.2);Dysphagia, unspecified (R13.10)    Aspiration Risk  Moderate aspiration risk    Diet Recommendation NPO (Until MBS completed)        Other  Recommendations Oral Care  Recommendations: Oral care BID    Recommendations for follow up therapy are one component of a multi-disciplinary discharge planning process, led by the attending physician.  Recommendations may be updated based on patient status, additional functional criteria and insurance authorization.  Follow up Recommendations Acute inpatient  rehab (3hours/day)      Assistance Recommended at Discharge Frequent or constant Supervision/Assistance  Functional Status Assessment Patient has had a recent decline in their functional status and demonstrates the ability to make significant improvements in function in a reasonable and predictable amount of time.  Frequency and Duration min 3x week  2 weeks       Prognosis Prognosis for Safe Diet Advancement: Good Barriers to Reach Goals: Severity of deficits      Swallow Study   General Date of Onset: 01/28/22 HPI: Patient is a 66 y.o. 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 On 01/25/22, patient began to have symptoms of dizziness followed by nausea and vomiting. Symptoms improved but then on 9/5 but she felt generally weak and was having trouble finding words. Husband observed right facial droop. She presented to [facility] ED as code stroke on 01/28/22; CT head without acute abnormality but CTA unable to be performed due to IV infiltration. She was taken directly to IR for thrombectomy and intubated for this procedure, extubated in morning of 01/29/22. MRI brain showed new acute infarcts in left posterior frontal and anterior parietal cortex. Type of Study: Bedside Swallow Evaluation Diet Prior to this Study: NPO Temperature Spikes Noted: No Respiratory Status: Room air History of Recent Intubation: No Behavior/Cognition: Alert;Cooperative;Pleasant mood Oral Cavity Assessment: Within Functional Limits Oral Care Completed by SLP: No Oral Cavity - Dentition: Adequate natural dentition Vision: Functional for self-feeding Self-Feeding Abilities: Needs assist Patient Positioning: Upright in bed Baseline Vocal Quality: Breathy Volitional Cough: Cognitively unable to elicit    Oral/Motor/Sensory Function Overall Oral Motor/Sensory  Function: Moderate impairment Facial ROM: Reduced right Facial Symmetry: Abnormal symmetry right Facial Strength: Reduced right Facial Sensation: Within Functional Limits Lingual ROM: Reduced right Lingual Symmetry: Abnormal symmetry right Lingual Strength: Reduced;Suspected CN XII (hypoglossal) dysfunction Lingual Sensation: Reduced;Suspected CN VII (facial) dysfunction-anterior 2/3 tongue Velum: Within Functional Limits Mandible: Within Functional Limits   Ice Chips Ice chips: Impaired Presentation: Spoon Oral Phase Impairments: Reduced labial seal Oral Phase Functional Implications: Right anterior spillage   Thin Liquid Thin Liquid: Impaired Presentation: Spoon;Self Fed;Straw Oral Phase Impairments: Reduced labial seal;Poor awareness of bolus Oral Phase Functional Implications: Right anterior spillage    Nectar Thick Nectar Thick Liquid: Not tested   Honey Thick Honey Thick Liquid: Not tested   Puree Puree: Within functional limits Presentation: Spoon;Self Fed   Solid     Solid: Impaired Presentation: Self Fed Oral Phase Functional Implications: Right lateral sulci pocketing      Gearlean Alf Student SLP  01/30/2022,12:52 PM

## 2022-01-30 NOTE — Progress Notes (Signed)
[facility] ADULT ICU REPLACEMENT PROTOCOL   The patient does apply for the [facility] Adult ICU Electrolyte Replacment Protocol based on the criteria listed below:   1.Exclusion criteria: TCTS patients, ECMO patients, and Dialysis patients 2. Is GFR >/= 30 ml/min? Yes.    Patient's GFR today is >60 3. Is SCr </= 2? Yes.   Patient's SCr is 0.65 mg/dL 4. Did SCr increase >/= 0.5 in 24 hours? No. 5.Pt's weight >40kg  Yes.   6. Abnormal electrolyte(s):   K 2.9  7. Electrolytes replaced per protocol 8.  Call MD STAT for K+ </= 2.5, Phos </= 1, or Mag </= 1 Physician:  Shawn Stall R Analeise Mccleery 01/30/2022 5:08 AM

## 2022-01-30 NOTE — Progress Notes (Signed)
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 Tommie Sams S/p M2/MCA thrombectomy on 9/5 by [name]   Subjective:  Patient seen with [name].  Patient laying in bed, NAD, spouse, daughter, and RN at bedside.  Expression aphasia but patient  is alert, oriented and able to follow command.  RN states that she did great with PT yesterday, was able to ambulate w/o assistance.  PT signed off, OT and speech following.  All questions from family members answered by [name].    Allergies: Doxycycline, Erythromycin, Iodine, Latex, Shellfish allergy, Strawberry extract, Vancomycin, Vicodin [hydrocodone-acetaminophen], Dilaudid [hydromorphone hcl], Keflex [cephalexin], Penicillins, and Sulfa antibiotics  Medications: Prior to Admission medications   Medication Sig Start Date End Date Taking? Authorizing Provider  ALPRAZolam Prudy Feeler) 0.5 MG tablet Take 0.5 mg by mouth at bedtime. 12/23/21  Yes [provider]  apixaban (ELIQUIS) 5 MG TABS tablet Take 1 tablet (5 mg total) by mouth 2 (two) times daily. 01/23/22  Yes Karie Fetch, MD  cyclobenzaprine (FLEXERIL) 10 MG tablet Take 1 tablet (10 mg total) by mouth 3 (three) times daily as needed for muscle spasms. 11/17/14  Yes Tia Alert, MD  guaiFENesin (MUCINEX) 600 MG 12 hr tablet Take 600 mg by mouth daily as needed for cough or to loosen phlegm.   Yes [provider]  loratadine (CLARITIN) 10 MG tablet Take 10 mg by mouth every morning.   Yes [provider]  MYRBETRIQ 25 MG TB24 tablet Take 25 mg by mouth daily. 12/31/21  Yes [provider]  pregabalin (LYRICA) 25 MG capsule Take 1 capsule (25 mg total) by mouth 2 (two) times daily. 01/26/22 02/25/22 Yes Eber Hong, MD   rosuvastatin (CRESTOR) 20 MG tablet Take 1 tablet (20 mg total) by mouth daily. 01/24/22  Yes Karie Fetch, MD  sacubitril-valsartan (ENTRESTO) 24-26 MG Take 1 tablet by mouth 2 (two) times daily. 01/23/22  Yes Karie Fetch, MD     Vital Signs: BP 133/81   Pulse 77   Temp 97.8 F (36.6 C) (Oral)   Resp 14   Ht 5\' 1"  (1.549 m)   Wt 130 lb 1.1 oz (59 kg)   SpO2 98%   BMI 24.58 kg/m   Physical Exam Vitals reviewed.  Constitutional:      General: She is not in acute distress.    Appearance: She is not ill-appearing.  HENT:     Head: Normocephalic.  Cardiovascular:     Comments: DP 2+ bilateral  Pulmonary:     Effort: Pulmonary effort is normal.  Skin:    General: Skin is warm and dry.     Coloration: Skin is not jaundiced or pale.     Comments: Positive dressing on R CFA puncture site. Site is unremarkable with no erythema, edema, tenderness, bleeding or drainage. Dressing clean, dry, and intact.   Positive pressure dressing on L CFA puncture site. Site with small ecchymosis, puncture site with minimal hardness but no appreciable pseudoaneurysm. Site otherwise unremarkable with no erythema, edema, tenderness, bleeding or drainage. Minimal amount of old, dry blood noted on the dressing. Dressing otherwise clean, dry, and intact.     Neurological:     Mental Status: She is alert.     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 EXAM: 1. EMERGENT LARGE VESSEL OCCLUSION THROMBOLYSIS (anterior CIRCULATION) COMPARISON:  None Available. MEDICATIONS: No antibiotic was administered within  1 hour of the procedure. ANESTHESIA/SEDATION: General anesthesia. CONTRAST:  Omnipaque 300 150 mL. FLUOROSCOPY TIME:  Fluoroscopy Time: 39 minutes 12 seconds (1289 mGy). COMPLICATIONS: None immediate. TECHNIQUE: Following a full explanation of the procedure along with the potential associated complications, an informed witnessed consent was obtained. The risks of intracranial hemorrhage of 10%, worsening neurological deficit, ventilator dependency, death and inability to revascularize were all reviewed in detail with the patient's spouse. The patient was then put under general anesthesia by the Department of Anesthesiology at [facility]. The left groin was prepped and draped in the usual sterile fashion. Thereafter using modified Seldinger technique, transfemoral access into the left common femoral artery was obtained without difficulty. Over a 0.035 inch guidewire an 8 French 25 cm Pinnacle sheath was inserted. Through this, and also over a 0.035 inch guidewire a combination of a wrist Simmons 2 support catheter inside of an 087 balloon guide catheter was advanced to the aortic region, and selectively positioned initially in the left common carotid artery and then advanced to the distal cervical left ICA over an 035 inch guidewire with support catheter. The guidewire was removed. Good aspiration obtained hub of the balloon guide catheter. Arteriogram was then performed proximally from the common carotid artery then the distal left internal carotid artery. FINDINGS: The left common carotid arteriogram demonstrates the left external carotid artery and its major branches to be widely patent. The left internal carotid artery at bulb to the cranial skull base demonstrates wide patency. Wide patency is noted of the petrous, the cavernous and the supraclinoid segments. Opacification of the left posterior cerebral artery via the left posterior communicating artery is noted. The left middle cerebral artery M1  segment is widely patent. The angiographic occlusion of the superior division mid M2 segment is noted. The left MCA inferior division opacifies into the capillary and venous phases. The left anterior cerebral artery opacifies into the capillary and venous phases. Transient cross-filling via the anterior communicating artery of the right anterior cerebral artery A2 segment and the A1 segment is evident. PROCEDURE: Over an Aristotle softip micro guidewire with a J configuration, a combination of a 132 cm Zoom aspiration catheter inside of which was a 160 cm Phenom microcatheter was advanced to the proximal left middle cerebral artery. Using a torque device, access was obtained with the micro guidewire through the occluded superior division into the proximal M3 segment followed by the microcatheter. The guidewire was removed. Good aspiration was obtained from the hub of the microcatheter. A gentle contrast injection demonstrated slow antegrade flow of contrast distally. A 3 mm x 36 mm retrieval device was then advanced to the distal end of the microcatheter and deployed in the usual manner. At this time the Zoom aspiration catheter was advanced into the proximal superior division. With proximal flow arrest in the left internal carotid artery constant aspiration was applied at the hub of the Zoom aspiration catheter and the aspiration device 2 minutes of  the balloon guide catheter with a 20 mL syringe. Combination of the retrieval, the catheter and the Zoom aspiration catheter was retrieved removed. Following reversal of flow arrest, control arteriogram with the balloon guide catheter in the left internal carotid artery demonstrated near complete opacification of the superior division with gradual improved flow into the M3 M4 segment of the superior division. Moderate spasm noted in the proximal superior division responded to 5 aliquotes of 25 mcg of nitroglycerin intra-arterially. Also noted was a small non flow  limiting filling defect in a mid parietal branch of the inferior division in the distal M3 M4 region which also gradually started clearing over a period of 20 minutes. Control arteriogram performed through the balloon guide catheter in the mid cervical left demonstrated near complete revascularization of the left MCA achieving a TICI 2C revascularization. A control arteriogram performed through the left common carotid artery demonstrated small dissection of the distal left internal carotid artery at the skull base medially without any flow limitation. The balloon guide catheter was removed. Diagnostic catheter arteriogram was then performed of the right common carotid artery and the right vertebral artery. These demonstrated the right external carotid artery and its major branches to be widely patent. Internal carotid artery at the bulb to the cranial skull base demonstrates wide patency. The petrous, the cavernous and the supraclinoid segments demonstrate wide patency as do the right middle cerebral artery and the right anterior cerebral artery into the capillary and venous phases. The right vertebral artery demonstrates wide patency. More distally, patency is maintained of the basilar junction in the right posterior-inferior cerebellar artery. The basilar artery, the posterior cerebral arteries, the superior cerebellar arteries and the anterior-inferior cerebellar arteries demonstrate patency into the capillary and venous phases. Unopacified blood was noted in the basilar artery from the contralateral left vertebral artery. An 8 French Angio-Seal closure device demonstrates hemostasis at the left groin puncture site. Distal pulses remained palpable in both feet unchanged. A CT of the brain demonstrated no hemorrhagic complications. The patient was left intubated due to poor responsiveness following reversal of the anesthesia. Patient was then transferred to the neuro ICU post revascularization care. IMPRESSION:  Status post endovascular revascularization of occluded division of the left middle cerebral artery achieving a TICI 2C revascularization of the left MCA distribution. Puncture to first pass approximately 36 mins. PLAN: Follow-up as per referring MD. Electronically Signed   By: Julieanne Cotton M.D.   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 EXAM: 1. EMERGENT LARGE VESSEL OCCLUSION THROMBOLYSIS (anterior CIRCULATION) COMPARISON:  None Available. MEDICATIONS: No antibiotic was administered within 1 hour of the procedure. ANESTHESIA/SEDATION: General anesthesia. CONTRAST:  Omnipaque 300 150 mL. FLUOROSCOPY TIME:  Fluoroscopy Time: 39 minutes 12 seconds (1289 mGy). COMPLICATIONS: None immediate. TECHNIQUE: Following a full explanation of the procedure along with the potential associated complications, an informed witnessed consent was obtained. The risks of intracranial hemorrhage of 10%, worsening neurological deficit, ventilator dependency, death and inability to revascularize were all reviewed in detail with the patient's spouse. The patient was then put under general anesthesia by the Department of Anesthesiology at [facility]. The left groin was prepped and draped in the usual sterile fashion. Thereafter using modified Seldinger technique, transfemoral access into the left common femoral artery was obtained without difficulty. Over a 0.035 inch guidewire an 8 French 25 cm Pinnacle sheath was inserted. Through this, and also over a  0.035 inch guidewire a combination of a wrist Simmons 2 support catheter inside of an 087 balloon guide catheter was advanced to the aortic region, and selectively positioned initially in the left common carotid artery and then advanced to the distal cervical left ICA over an 035 inch guidewire with support catheter. The guidewire was removed. Good aspiration obtained hub of the balloon  guide catheter. Arteriogram was then performed proximally from the common carotid artery then the distal left internal carotid artery. FINDINGS: The left common carotid arteriogram demonstrates the left external carotid artery and its major branches to be widely patent. The left internal carotid artery at bulb to the cranial skull base demonstrates wide patency. Wide patency is noted of the petrous, the cavernous and the supraclinoid segments. Opacification of the left posterior cerebral artery via the left posterior communicating artery is noted. The left middle cerebral artery M1 segment is widely patent. The angiographic occlusion of the superior division mid M2 segment is noted. The left MCA inferior division opacifies into the capillary and venous phases. The left anterior cerebral artery opacifies into the capillary and venous phases. Transient cross-filling via the anterior communicating artery of the right anterior cerebral artery A2 segment and the A1 segment is evident. PROCEDURE: Over an Aristotle softip micro guidewire with a J configuration, a combination of a 132 cm Zoom aspiration catheter inside of which was a 160 cm Phenom microcatheter was advanced to the proximal left middle cerebral artery. Using a torque device, access was obtained with the micro guidewire through the occluded superior division into the proximal M3 segment followed by the microcatheter. The guidewire was removed. Good aspiration was obtained from the hub of the microcatheter. A gentle contrast injection demonstrated slow antegrade flow of contrast distally. A 3 mm x 36 mm retrieval device was then advanced to the distal end of the microcatheter and deployed in the usual manner. At this time the Zoom aspiration catheter was advanced into the proximal superior division. With proximal flow arrest in the left internal carotid artery constant aspiration was applied at the hub of the Zoom aspiration catheter and the aspiration device  2 minutes of the balloon guide catheter with a 20 mL syringe. Combination of the retrieval, the catheter and the Zoom aspiration catheter was retrieved removed. Following reversal of flow arrest, control arteriogram with the balloon guide catheter in the left internal carotid artery demonstrated near complete opacification of the superior division with gradual improved flow into the M3 M4 segment of the superior division. Moderate spasm noted in the proximal superior division responded to 5 aliquotes of 25 mcg of nitroglycerin intra-arterially. Also noted was a small non flow limiting filling defect in a mid parietal branch of the inferior division in the distal M3 M4 region which also gradually started clearing over a period of 20 minutes. Control arteriogram performed through the balloon guide catheter in the mid cervical left demonstrated near complete revascularization of the left MCA achieving a TICI 2C revascularization. A control arteriogram performed through the left common carotid artery demonstrated small dissection of the distal left internal carotid artery at the skull base medially without any flow limitation. The balloon guide catheter was removed. Diagnostic catheter arteriogram was then performed of the right common carotid artery and the right vertebral artery. These demonstrated the right external carotid artery and its major branches to be widely patent. Internal carotid artery at the bulb to the cranial skull base demonstrates wide patency. The petrous, the cavernous and the supraclinoid segments  demonstrate wide patency as do the right middle cerebral artery and the right anterior cerebral artery into the capillary and venous phases. The right vertebral artery demonstrates wide patency. More distally, patency is maintained of the basilar junction in the right posterior-inferior cerebellar artery. The basilar artery, the posterior cerebral arteries, the superior cerebellar arteries and the  anterior-inferior cerebellar arteries demonstrate patency into the capillary and venous phases. Unopacified blood was noted in the basilar artery from the contralateral left vertebral artery. An 8 French Angio-Seal closure device demonstrates hemostasis at the left groin puncture site. Distal pulses remained palpable in both feet unchanged. A CT of the brain demonstrated no hemorrhagic complications. The patient was left intubated due to poor responsiveness following reversal of the anesthesia. Patient was then transferred to the neuro ICU post revascularization care. IMPRESSION: Status post endovascular revascularization of occluded division of the left middle cerebral artery achieving a TICI 2C revascularization of the left MCA distribution. Puncture to first pass approximately 36 mins. PLAN: Follow-up as per referring MD. Electronically Signed   By: Julieanne Cotton M.D.   On: 01/30/2022 08:42   MR BRAIN WO CONTRAST  Result Date: 01/28/2022 CLINICAL DATA:  Right MCA stroke status post thrombectomy, follow-up EXAM: MRI HEAD WITHOUT CONTRAST MRA HEAD WITHOUT CONTRAST TECHNIQUE: Multiplanar, multi-echo pulse sequences of the brain and surrounding structures were acquired without intravenous contrast. Angiographic images of the Circle of Willis were acquired using MRA technique without intravenous contrast. COMPARISON:  01/22/2022 MRI head, no prior MRA, correlation is made with CTA head and neck 01/21/2022 FINDINGS: Brain: New foci of restricted diffusion with ADC correlate in the left posterior frontal and anterior parietal cortex (series 5, images 80 8-95). Redemonstrated focus of restricted diffusion with ADC correlate in the left insula (series 5, image 76 and series 6, image 26). Other previously noted foci of restricted diffusion are decreased in conspicuity and do not have persistent ADC correlates. No acute hemorrhage, mass, mass effect, or midline shift. No hydrocephalus or extra-axial collection.  Punctate focus of hemosiderin deposition in the left posterior frontal lobe. Minimal T2 hyperintense signal in the periventricular Stokes matter and pons, likely the sequela of mild chronic small vessel ischemic disease. Vascular: Normal arterial flow voids. Skull and upper cervical spine: Normal marrow signal. Sinuses/Orbits: Minimal mucosal thickening in the ethmoid air cells. The orbits are unremarkable. Other: The mastoids are well aerated. MRA HEAD FINDINGS Anterior circulation: Both internal carotid arteries are patent to the termini, without significant stenosis. A1 segments patent. Normal anterior communicating artery. Anterior cerebral arteries are patent to their distal aspects. No M1 stenosis or occlusion. Distal MCA branches perfused and symmetric.In particular, the left MCA branches appear perfused and without focal stenosis. Posterior circulation: Vertebral arteries patent to the vertebrobasilar junction without stenosis. Basilar patent to its distal aspect. Superior cerebellar arteries patent bilaterally. Patent P1 segments. PCAs perfused to their distal aspects without stenosis. Diminutive left posterior communicating artery. The right posterior communicating artery is not definitively visualized. Anatomic variants: None significant IMPRESSION: 1. 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 2. No intracranial large vessel occlusion or significant stenosis. In particular, left MCA branches appear perfused and without focal stenosis. These results will be called to the ordering clinician or representative by the Radiologist Assistant, and communication documented in the PACS or [employer]. Electronically Signed   By: Wiliam Ke M.D.   On: 01/28/2022 23:40   MR ANGIO HEAD WO CONTRAST  Result Date: 01/28/2022 CLINICAL DATA:  Right MCA stroke status post thrombectomy, follow-up EXAM: MRI HEAD WITHOUT CONTRAST MRA HEAD WITHOUT CONTRAST TECHNIQUE: Multiplanar,  multi-echo pulse sequences of the brain and surrounding structures were acquired without intravenous contrast. Angiographic images of the Circle of Willis were acquired using MRA technique without intravenous contrast. COMPARISON:  01/22/2022 MRI head, no prior MRA, correlation is made with CTA head and neck 01/21/2022 FINDINGS: Brain: New foci of restricted diffusion with ADC correlate in the left posterior frontal and anterior parietal cortex (series 5, images 80 8-95). Redemonstrated focus of restricted diffusion with ADC correlate in the left insula (series 5, image 76 and series 6, image 26). Other previously noted foci of restricted diffusion are decreased in conspicuity and do not have persistent ADC correlates. No acute hemorrhage, mass, mass effect, or midline shift. No hydrocephalus or extra-axial collection. Punctate focus of hemosiderin deposition in the left posterior frontal lobe. Minimal T2 hyperintense signal in the periventricular [place] matter and pons, likely the sequela of mild chronic small vessel ischemic disease. Vascular: Normal arterial flow voids. Skull and upper cervical spine: Normal marrow signal. Sinuses/Orbits: Minimal mucosal thickening in the ethmoid air cells. The orbits are unremarkable. Other: The mastoids are well aerated. MRA HEAD FINDINGS Anterior circulation: Both internal carotid arteries are patent to the termini, without significant stenosis. A1 segments patent. Normal anterior communicating artery. Anterior cerebral arteries are patent to their distal aspects. No M1 stenosis or occlusion. Distal MCA branches perfused and symmetric.In particular, the left MCA branches appear perfused and without focal stenosis. Posterior circulation: Vertebral arteries patent to the vertebrobasilar junction without stenosis. Basilar patent to its distal aspect. Superior cerebellar arteries patent bilaterally. Patent P1 segments. PCAs perfused to their distal aspects without stenosis.  Diminutive left posterior communicating artery. The right posterior communicating artery is not definitively visualized. Anatomic variants: None significant IMPRESSION: 1. 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 2. No intracranial large vessel occlusion or significant stenosis. In particular, left MCA branches appear perfused and without focal stenosis. These results will be called to the ordering clinician or representative by the Radiologist Assistant, and communication documented in the PACS or [employer]. Electronically Signed   By: Wiliam Ke M.D.   On: 01/28/2022 23:40   [place] EKG SITE RITE  Result Date: 01/28/2022 If Site Rite image not attached, placement could not be confirmed due to current cardiac rhythm.  DG Abd 1 View  Result Date: 01/28/2022 CLINICAL DATA:  Evaluate OG tube placement EXAM: ABDOMEN - 1 VIEW COMPARISON:  None Available. FINDINGS: The OG tube terminates in the left upper quadrant, in the region of the stomach. IMPRESSION: The OG tube appears to terminate in the stomach. No other abnormalities. Electronically Signed   By: Gerome Sam III M.D.   On: 01/28/2022 17:28   Portable Chest x-ray  Result Date: 01/28/2022 CLINICAL DATA:  [id number]. Encounter for ETT placement. EXAM: PORTABLE CHEST 1 VIEW COMPARISON:  Chest x-ray 01/26/2022, CT heart 01/23/2022 FINDINGS: Enteric tube with tip terminating 3 cm above the carina. Slightly more prominent aortic arch and aortic knob likely due to positioning and AP portable technique. Otherwise the heart and mediastinal contours are unchanged normal limits. No focal consolidation. No pulmonary edema. No pleural effusion. No pneumothorax. No acute osseous abnormality. IMPRESSION: 1. Slightly more prominent aortic arch and aortic knob likely due to positioning and AP portable technique. Finding can likely be further evaluated on CT angio head and neck 01/28/2022. 2. No active disease. Electronically  Signed  By: Tish Frederickson M.D.   On: 01/28/2022 17:21   CT HEAD CODE STROKE WO CONTRAST  Result Date: 01/28/2022 CLINICAL DATA:  Code stroke.  Neuro deficit, acute, stroke suspected EXAM: CT HEAD WITHOUT CONTRAST TECHNIQUE: Contiguous axial images were obtained from the base of the skull through the vertex without intravenous contrast. RADIATION DOSE REDUCTION: This exam was performed according to the departmental dose-optimization program which includes automated exposure control, adjustment of the mA and/or kV according to patient size and/or use of iterative reconstruction technique. COMPARISON:  CT head 01/21/2022. FINDINGS: Brain: No evidence of acute large vascular territory infarction, hemorrhage, hydrocephalus, extra-axial collection or mass lesion/mass effect. Vascular: No hyperdense vessel identified. Skull: No acute fracture. Sinuses/Orbits: Clear sinuses.  No acute orbital findings. Other: No mastoid effusions. ASPECTS [facility] Stroke Program Early CT Score) total score (0-10 with 10 being normal): 10. IMPRESSION: 1. No evidence of acute intracranial abnormality. 2. ASPECTS is 10. Code stroke imaging results were communicated on 01/28/2022 at 1:43 pm to provider Bhagat via secure text paging. Electronically Signed   By: Feliberto Harts M.D.   On: 01/28/2022 13:43   DG Chest 2 View  Result Date: 01/26/2022 CLINICAL DATA:  Chest and shoulder pain.  Recent MRI and CVA. EXAM: CHEST - 2 VIEW COMPARISON:  11/21/2021 FINDINGS: The lungs are clear without focal pneumonia, edema, pneumothorax or pleural effusion. The cardiopericardial silhouette is within normal limits for size. The visualized bony structures of the thorax are unremarkable. IMPRESSION: No active cardiopulmonary disease. Electronically Signed   By: Kennith Center M.D.   On: 01/26/2022 12:03    Labs:  CBC: Recent Labs    01/26/22 1118 01/28/22 1330 01/28/22 1337 01/28/22 1747 01/29/22 0300 01/30/22 0143  WBC 5.4 4.7  --   --   8.6 5.3  HGB 13.8 12.0 11.9* 10.2* 11.2* 9.2*  HCT 44.1 35.5* 35.0* 30.0* 33.3* 27.3*  PLT 170 171  --   --  173 143*    COAGS: Recent Labs    01/21/22 1445 01/28/22 1330 01/28/22 1330 01/29/22 0300 01/29/22 1025 01/29/22 1745 01/30/22 0143  INR 1.1 1.2  --   --   --   --   --   APTT  --  24   < > 41* 39* 58* 77*   < > = values in this interval not displayed.    BMP: Recent Labs    01/26/22 1557 01/28/22 1330 01/28/22 1337 01/28/22 1747 01/29/22 0845 01/30/22 0143  NA 142 141 138 142 138 139  K 3.3* 3.8 4.0 3.7 3.5 2.9*  CL 109 109 109  --  110 112*  CO2 22 23  --   --  20* 21*  GLUCOSE 95 115* 110*  --  148* 100*  BUN --  6* <5*  CALCIUM 9.6 9.4  --   --  8.8* 8.3*  CREATININE 0.61 0.84 0.70  --  0.56 0.65  GFRNONAA >60 >60  --   --  >60 >60    LIVER FUNCTION TESTS: Recent Labs    01/21/22 1445 01/28/22 1330  BILITOT 1.0 0.3  AST 19 22  ALT 16 18  ALKPHOS 47 45  PROT 6.8 6.4*  ALBUMIN 4.1 3.9    Assessment and Plan:  65 y.o. female with recurrent left M2/MCA occlusion, s/p mechanical thrombectomy on 8/29 by Dr. Tommie Sams (TICI 2C) and on 9/5  by [name] (TICI 2C) Extubated 9/6.   Echocardiogram on 8/29 showed  left ventricular thrombus, she was started on Eliquis.  Currently on heparin IV as she has not had speech eval yet.  Hypercoagulable and autoimmune work up pending.   R CFA puncture site clean, soft, non tender.  L CFA puncture site with small ecchymosis, mild hardening but non tender, no appreciable pseudoaneurysm.   DP 2+ bilaterally   OT recommended inpatient rehab.   Further treatment plan per Stroke team  Appreciate and agree with the plan.  NIR to follow.    Electronically Signed: Willette Brace, PA-C 01/30/2022, 8:44 AM   I spent a total of 15 Minutes at the the patient's bedside AND on the patient's hospital floor or unit, greater than 50% of which was counseling/coordinating care for stroke f/u,  recurrent Left M2/MCA occlusion.   This chart was dictated using voice recognition software.  Despite best efforts to proofread,  errors can occur which can change the documentation meaning.

## 2022-01-30 NOTE — Progress Notes (Signed)
Speech Language Pathology Treatment: Cognitive-Linquistic  Patient Details Name: Chloe Johnson MRN: [id number] DOB: [birth year] Today's Date: 01/30/2022 Time: 1001-1045 SLP Time Calculation (min) (ACUTE ONLY): 44 min  Assessment / Plan / Recommendation Clinical Impression  Pt demonstrates significant improvement in ability to initiate and coordinate oral motor movement today. Pt able to follow all oral motor commands though right CN XII and VII weakness observed. Primary impairment continues to be motor planning with complex speech tasks. Pt can initiate and control phonation, but needs heavy articulatory placement cueing, visual feedback and melodic intonation techniques to approximate /ma/ and Happy Birthday song. Pt is a singer and family at bedside encouraged to continue attempting these methods. Would benefit from use of a mirror. Pt is using total communication techniques such as gestures, pointing, facial expressions and a communication board to express wants and needs.  Pt demonstrates potential for mild auditory comprehension impairment. Though she appears to follow social language well and demonstrated ability to follow one and two step commands with LUE with 100% accuracy, pt needed several repetitions and slower rate with three step commands and was unsuccessful with complex commands. She had 60% accuracy with complex Y/N questions. 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  Recommend AIR at d/c for intensive speech interventions with good potential for functional gains.    HPI HPI: Patient is a 66 y.o. 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 On 01/25/22, patient began to have symptoms of dizziness followed by nausea and vomiting. Symptoms improved but then on 9/5 but she felt generally weak and was  having trouble finding words. Husband observed right facial droop. She presented to [facility] ED as code stroke on 01/28/22; CT head without acute abnormality but CTA unable to be performed due to IV infiltration. She was taken directly to IR for thrombectomy and intubated for this procedure, extubated in morning of 01/29/22. MRI brain showed new acute infarcts in left posterior frontal and anterior parietal cortex.      SLP Plan  Continue with current plan of care      Recommendations for follow up therapy are one component of a multi-disciplinary discharge planning process, led by the attending physician.  Recommendations may be updated based on patient status, additional functional criteria and insurance authorization.    Recommendations                   Oral Care Recommendations: Oral care BID Follow Up Recommendations: Acute inpatient rehab (3hours/day) Assistance recommended at discharge: Frequent or constant Supervision/Assistance SLP Visit Diagnosis: Apraxia (R48.2);Dysphagia, unspecified (R13.10) Plan: Continue with current plan of care           Ivy Puryear, Riley Nearing  01/30/2022, 1:09 PM

## 2022-01-31 DIAGNOSIS — Z8673 Personal history of transient ischemic attack (TIA), and cerebral infarction without residual deficits: Secondary | ICD-10-CM | POA: Diagnosis not present

## 2022-01-31 DIAGNOSIS — I6602 Occlusion and stenosis of left middle cerebral artery: Secondary | ICD-10-CM

## 2022-01-31 DIAGNOSIS — I639 Cerebral infarction, unspecified: Secondary | ICD-10-CM | POA: Diagnosis not present

## 2022-01-31 DIAGNOSIS — I5181 Takotsubo syndrome: Secondary | ICD-10-CM | POA: Diagnosis not present

## 2022-01-31 DIAGNOSIS — I513 Intracardiac thrombosis, not elsewhere classified: Secondary | ICD-10-CM | POA: Diagnosis not present

## 2022-01-31 LAB — BASIC METABOLIC PANEL
Anion gap: 6 (ref 5–15)
BUN: <5 mg/dL — ABNORMAL LOW (ref 8–23)
CO2: 24 mmol/L (ref 22–32)
Calcium: 8.6 mg/dL — ABNORMAL LOW (ref 8.9–10.3)
Chloride: 109 mmol/L (ref 98–111)
Creatinine, Ser: 0.61 mg/dL (ref 0.44–1.00)
GFR, Estimated: >60 mL/min (ref >60)
Glucose, Bld: 92 mg/dL (ref 70–99)
Potassium: 3.1 mmol/L — ABNORMAL LOW (ref 3.5–5.1)
Sodium: 139 mmol/L (ref 135–145)

## 2022-01-31 LAB — CBC
HCT: 28 % — ABNORMAL LOW (ref 36.0–46.0)
Hemoglobin: 9.3 g/dL — ABNORMAL LOW (ref 12.0–15.0)
MCH: 28.9 pg (ref 26.0–34.0)
MCHC: 33.2 g/dL (ref 30.0–36.0)
MCV: 87 fL (ref 80.0–100.0)
Platelets: 157 10*3/uL (ref 150–400)
RBC: 3.22 MIL/uL — ABNORMAL LOW (ref 3.87–5.11)
RDW: 11.9 % (ref 11.5–15.5)
WBC: 5.2 10*3/uL (ref 4.0–10.5)
nRBC: 0 % (ref 0.0–0.2)

## 2022-01-31 LAB — PROTIME-INR
INR: 1.2 (ref 0.8–1.2)
Prothrombin Time: 14.9 seconds (ref 11.4–15.2)

## 2022-01-31 LAB — CARDIOLIPIN ANTIBODIES, IGG, IGM, IGA
Anticardiolipin IgA: <9 APL U/mL (ref 0–11)
Anticardiolipin IgG: <9 GPL U/mL (ref 0–14)
Anticardiolipin IgM: <9 MPL U/mL (ref 0–12)

## 2022-01-31 LAB — HOMOCYSTEINE: Homocysteine: 7.6 umol/L (ref 0.0–17.2)

## 2022-01-31 LAB — BETA-2-GLYCOPROTEIN I ABS, IGG/M/A
Beta-2 Glyco I IgG: <9 GPI IgG units (ref 0–20)
Beta-2-Glycoprotein I IgA: <9 GPI IgA units (ref 0–25)
Beta-2-Glycoprotein I IgM: <9 GPI IgM units (ref 0–32)

## 2022-01-31 LAB — ANA W/REFLEX IF POSITIVE: Anti Nuclear Antibody (ANA): NEGATIVE

## 2022-01-31 LAB — ANTI-DNA ANTIBODY, DOUBLE-STRANDED: ds DNA Ab: 1 IU/mL (ref 0–9)

## 2022-01-31 MED ORDER — WARFARIN SODIUM 5 MG PO TABS
5.0000 mg | ORAL_TABLET | Freq: Once | ORAL | Status: AC
Start: 2022-01-31 — End: 2022-01-31
  Administered 2022-01-31: 5 mg via ORAL
  Filled 2022-01-31: qty 1

## 2022-01-31 MED ORDER — POTASSIUM CHLORIDE CRYS ER 20 MEQ PO TBCR
40.0000 meq | EXTENDED_RELEASE_TABLET | Freq: Once | ORAL | Status: AC
Start: 2022-01-31 — End: 2022-01-31
  Administered 2022-01-31: 40 meq via ORAL
  Filled 2022-01-31: qty 2

## 2022-01-31 NOTE — Progress Notes (Signed)
ANTICOAGULATION CONSULT NOTE  Pharmacy Consult for Enoxaparin and Warfarin Indication: LV thrombus  Allergies  Allergen Reactions   Doxycycline Nausea Only    malaise    Erythromycin Nausea And Vomiting   Iodine     unknown   Latex Other (See Comments)    Irritation of the skin   Shellfish Allergy Other (See Comments)    GI- Facial  Acne   Strawberry Extract Other (See Comments)    Mouth ulcers    Vancomycin    Vicodin [Hydrocodone-Acetaminophen] Other (See Comments)    Restless, tolerate percocet   Dilaudid [Hydromorphone Hcl] Rash    *Pt states she can take if given benadryl prior*   Keflex [Cephalexin] Rash   Penicillins Rash    Hives   Sulfa Antibiotics Rash    Hives    Patient Measurements: Height: 5\' 1"  (154.9 cm) Weight: 59 kg (130 lb 1.1 oz) IBW/kg (Calculated) : 47.8  Vital Signs: Temp: 98.2 F (36.8 C) (09/08 0800) Temp Source: Oral (09/08 0800) BP: 118/74 (09/08 0800) Pulse Rate: 83 (09/08 0800)  Labs: Recent Labs    01/28/22 1330 01/28/22 1337 01/28/22 1704 01/28/22 1747 01/29/22 0300 01/29/22 0845 01/29/22 1745 01/30/22 0143 01/30/22 1000 01/30/22 1344 01/30/22 1620 01/31/22 0415  HGB 12.0   < >  --    < > 11.2*  --   --  9.2*  --   --   --  9.3*  HCT 35.5*   < >  --    < > 33.3*  --   --  27.3*  --   --   --  28.0*  PLT 171  --   --   --  173  --   --  143*  --   --   --  157  APTT 24  --   --   --  41*   < > 58* 77* 72*  --   --   --   LABPROT 15.0  --   --   --   --   --   --   --   --  14.4  --  14.9  INR 1.2  --   --   --   --   --   --   --   --  1.1  --  1.2  HEPARINUNFRC  --   --   --   --   --   --  0.66 0.60 0.52  --   --   --   CREATININE 0.84   < >  --   --   --    < >  --  0.65  --   --  0.56 0.61  TROPONINIHS  --   --  69*  --  41*  --   --   --   --   --   --   --    < > = values in this interval not displayed.     Estimated Creatinine Clearance: 58.7 mL/min (by C-G formula based on SCr of 0.61 mg/dL).   Medical  History: 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/16/2013   L3-4/notes 11/24/2013, arms, neck   Exertional asthma    GERD (gastroesophageal reflux disease)    Hypertension    Kidney stones    "have always passed them"   Neck pain    Osteomyelitis (HCC) 11/23/2013  osteomyelitis, discitis    Sleep concern    uses Prozac for sleep     Medications:  Medications Prior to Admission  Medication Sig Dispense Refill Last Dose   ALPRAZolam (XANAX) 0.5 MG tablet Take 0.5 mg by mouth at bedtime.   01/27/2022   apixaban (ELIQUIS) 5 MG TABS tablet Take 1 tablet (5 mg total) by mouth 2 (two) times daily. 60 tablet 1 01/28/2022 at 0800   cyclobenzaprine (FLEXERIL) 10 MG tablet Take 1 tablet (10 mg total) by mouth 3 (three) times daily as needed for muscle spasms. 90 tablet 5 01/27/2022   guaiFENesin (MUCINEX) 600 MG 12 hr tablet Take 600 mg by mouth daily as needed for cough or to loosen phlegm.   Past Month   loratadine (CLARITIN) 10 MG tablet Take 10 mg by mouth every morning.   01/28/2022   MYRBETRIQ 25 MG TB24 tablet Take 25 mg by mouth daily.   01/28/2022   pregabalin (LYRICA) 25 MG capsule Take 1 capsule (25 mg total) by mouth 2 (two) times daily. 60 capsule 0 01/28/2022   rosuvastatin (CRESTOR) 20 MG tablet Take 1 tablet (20 mg total) by mouth daily. 30 tablet 1 01/28/2022   sacubitril-valsartan (ENTRESTO) 24-26 MG Take 1 tablet by mouth 2 (two) times daily. 60 tablet 1 01/28/2022   Scheduled:   ALPRAZolam  0.5 mg Oral QHS   aspirin EC  81 mg Oral Daily   Chlorhexidine Gluconate Cloth  6 each Topical Daily   docusate  100 mg Oral BID   enoxaparin (LOVENOX) injection  60 mg Subcutaneous Q12H   loratadine  10 mg Oral Daily   polyethylene glycol  17 g Oral Daily   pregabalin  25 mg Oral BID   rosuvastatin  20 mg Oral Daily   sacubitril-valsartan  1 tablet Oral BID   sodium chloride flush  10-40 mL Intracatheter Q12H   warfarin  5 mg Oral ONCE-1600    Warfarin - Pharmacist Dosing Inpatient   Does not apply q1600    Assessment: 61 yoF with admitted for stroke s/p thrombectomy on 9/5. 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  Readmitted with recurrent CVA after 7 days of eliquis with compliance. Last Eliquis dose 9/5 @0800 .  Currently on warfarin while bridging with enoxaparin SQ. Pharmacy consulted 01/30/22 to assist with dosing.  INR today is 1.2, subtherapeutic after warfarin started 01/30/22.  CBC stable with no signs of bleeding. CrCl stable at 50-60 mL/min.  Goal of Therapy:  INR 2.5-3  (2.5 per Dr. 04/01/22) Anti-Xa level 0.6-1 units/ml 4hrs after LMWH dose given Monitor platelets by anticoagulation protocol: Yes   Plan:  Continue Enoxaparin 60mg  SQ BID at 1600 Warfarin 5mg  PO today x1 Check INR , CBC daily  Check anti-Xa level once at steady state  after  4th dose tomorrow AM.  Cardiologist noted target INR 2.5 and he plans for patient follow up at  coumadin clinic at [facility].    , RPh Clinical Pharmacist 01/31/2022, 10:52 AM

## 2022-01-31 NOTE — Plan of Care (Signed)
  Problem: Education: Goal: Understanding of CV disease, CV risk reduction, and recovery process will improve Outcome: Progressing   Problem: Education: Goal: Individualized Educational Video(s) Outcome: Progressing   Problem: Activity: Goal: Ability to return to baseline activity level will improve Outcome: Progressing   Problem: Cardiovascular: Goal: Ability to achieve and maintain adequate cardiovascular perfusion will improve Outcome: Progressing   Problem: Cardiovascular: Goal: Vascular access site(s) Level 0-1 will be maintained Outcome: Progressing   Problem: Health Behavior/Discharge Planning: Goal: Ability to safely manage health-related needs after discharge will improve Outcome: Progressing   Problem: Education: Goal: Knowledge of General Education information will improve Description: Including pain rating scale, medication(s)/side effects and non-pharmacologic comfort measures Outcome: Progressing   Problem: Health Behavior/Discharge Planning: Goal: Ability to manage health-related needs will improve Outcome: Progressing   Problem: Clinical Measurements: Goal: Will remain free from infection Outcome: Progressing   Problem: Clinical Measurements: Goal: Cardiovascular complication will be avoided Outcome: Progressing   Problem: Clinical Measurements: Goal: Respiratory complications will improve Outcome: Progressing   Problem: Nutrition: Goal: Adequate nutrition will be maintained Outcome: Progressing   Problem: Activity: Goal: Risk for activity intolerance will decrease Outcome: Progressing   Problem: Coping: Goal: Level of anxiety will decrease Outcome: Progressing   Problem: Elimination: Goal: Will not experience complications related to bowel motility Outcome: Progressing   Problem: Respiratory: Goal: Ability to maintain a clear airway and adequate ventilation will improve Outcome: Progressing   Problem: Activity: Goal: Ability to  tolerate increased activity will improve Outcome: Progressing

## 2022-01-31 NOTE — Progress Notes (Cosign Needed Addendum)
Referring Physician(s):    Supervising Physician: Julieanne Cotton  Patient Status:  [facility] - In-pt  Chief Complaint:  Recurrent left M2/ MCA occlusion, patient was found to have a LV thrombus on 8/29  S/p M2/MCA thrombectomy on 8/29 by Dr. Tommie Sams S/p M2/MCA thrombectomy on 9/5 by [name]   Subjective: Patient seen alongside [name].  She is standing up at sink attempted to perform self care with OT.  She is able to follow commands and demonstrates understanding with head nods and gestures.  Attempts to verbalize and is able to get generate phonation in the deep throat, but no words or discernable speech. She is able to lift the right arm at the shoulder.   Allergies: Doxycycline, Erythromycin, Iodine, Latex, Shellfish allergy, Strawberry extract, Vancomycin, Vicodin [hydrocodone-acetaminophen], Dilaudid [hydromorphone hcl], Keflex [cephalexin], Penicillins, and Sulfa antibiotics  Medications: Prior to Admission medications   Medication Sig Start Date End Date Taking? Authorizing Provider  ALPRAZolam Prudy Feeler) 0.5 MG tablet Take 0.5 mg by mouth at bedtime. 12/23/21  Yes [provider]  apixaban (ELIQUIS) 5 MG TABS tablet Take 1 tablet (5 mg total) by mouth 2 (two) times daily. 01/23/22  Yes Karie Fetch, MD  cyclobenzaprine (FLEXERIL) 10 MG tablet Take 1 tablet (10 mg total) by mouth 3 (three) times daily as needed for muscle spasms. 11/17/14  Yes Tia Alert, MD  guaiFENesin (MUCINEX) 600 MG 12 hr tablet Take 600 mg by mouth daily as needed for cough or to loosen phlegm.   Yes [provider]  loratadine (CLARITIN) 10 MG tablet Take 10 mg by mouth every morning.   Yes [provider]  MYRBETRIQ 25 MG TB24 tablet Take 25 mg by mouth daily. 12/31/21  Yes [provider]  pregabalin (LYRICA) 25 MG capsule Take 1 capsule (25 mg total) by mouth 2 (two) times daily. 01/26/22 02/25/22 Yes Eber Hong, MD  rosuvastatin (CRESTOR)  20 MG tablet Take 1 tablet (20 mg total) by mouth daily. 01/24/22  Yes Karie Fetch, MD  sacubitril-valsartan (ENTRESTO) 24-26 MG Take 1 tablet by mouth 2 (two) times daily. 01/23/22  Yes Karie Fetch, MD     Vital Signs: BP 107/63 (BP Location: Right Arm)   Pulse 86   Temp 98.2 F (36.8 C) (Oral)   Resp 17   Ht  (1.549 m)   Wt 130 lb 1.1 oz (59 kg)   SpO2 100%   BMI 24.58 kg/m   Physical Exam Vitals reviewed.  Constitutional:      General: She is not in acute distress.    Appearance: She is not ill-appearing.  HENT:     Head: Normocephalic.  Cardiovascular:     Comments: DP 2+ bilateral  Pulmonary:     Effort: Pulmonary effort is normal.  Skin:    General: Skin is warm and dry.     Coloration: Skin is not jaundiced or pale.     Comments:     Neurological:     Mental Status: She is alert.     Comments: Alert, awake, and oriented  Expression aphasia  Right facial droop  Able to stick tongue out today after a great deal of effort and coordination. Now able to stand wihtout assistance.  Able to lift RUE against gravity.       Imaging: DG Swallowing Func-Speech Pathology  Result Date: 01/30/2022 Table formatting from the original result was not included. Images from the original result were not included. Objective Swallowing Evaluation:  Type of Study: MBS-Modified Barium Swallow Study  Patient Details Name: RHIAN ASEBEDO MRN: [id number] Date of Birth: Oct 06, 1955 Today's Date: 01/30/2022 Time: SLP Start Time (ACUTE ONLY): 1135 -SLP Stop Time (ACUTE ONLY): 1200 SLP Time Calculation (min) (ACUTE ONLY): 25 min Past Medical History: Past Medical History: Diagnosis Date  Anxiety   Back pain   Discitis of lumbar region 11/16/2013  L3-4/notes 11/24/2013, arms, neck  Exertional asthma   GERD (gastroesophageal reflux disease)   Hypertension   Kidney stones   "have always passed them"  Neck pain   Osteomyelitis (HCC) 11/23/2013  osteomyelitis, discitis   Sleep concern   uses Prozac  for sleep  Past Surgical History: Past Surgical History: Procedure Laterality Date  BREAST CYST EXCISION Left 2010  "polypectomy"  IR CT HEAD LTD  01/21/2022  IR CT HEAD LTD  01/28/2022  IR PERCUTANEOUS ART THROMBECTOMY/INFUSION INTRACRANIAL INC DIAG ANGIO  01/21/2022  IR PERCUTANEOUS ART THROMBECTOMY/INFUSION INTRACRANIAL INC DIAG ANGIO  01/28/2022  IR US GUIDE VASC ACCESS RIGHT  01/22/2022  PICC LINE PLACE PERIPHERAL ([facility] HX) Right   for use of Levaquin & Vancomycin, of note: she reports that she had a "flulike feeling"   RADIOLOGY WITH ANESTHESIA N/A 01/21/2022  Procedure: IR WITH ANESTHESIA;  Surgeon: Julieanne Cotton, MD;  Location: [facility] OR;  Service: Radiology;  Laterality: N/A;  RADIOLOGY WITH ANESTHESIA N/A 01/28/2022  Procedure: IR WITH ANESTHESIA;  Surgeon: Radiologist, Medication, MD;  Location: [facility] OR;  Service: Radiology;  Laterality: N/A;  TONSILLECTOMY AND ADENOIDECTOMY  1975  TUBAL LIGATION  1999 HPI: Patient is a 66 y.o. 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 On 01/25/22, patient began to have symptoms of dizziness followed by nausea and vomiting. Symptoms improved but then on 9/5 but she felt generally weak and was having trouble finding words. Husband observed right facial droop. She presented to [facility] ED as code stroke on 01/28/22; CT head without acute abnormality but CTA unable to be performed due to IV infiltration. She was taken directly to IR for thrombectomy and intubated for this procedure, extubated in morning of 01/29/22. MRI brain showed new acute infarcts in left posterior frontal and anterior parietal cortex.  Subjective: awake, alert, husband and two daughters present in room  Recommendations for follow up therapy are one component of a multi-disciplinary discharge planning process, led by the attending physician.  Recommendations  may be updated based on patient status, additional functional criteria and insurance authorization. Assessment / Plan / Recommendation   01/30/2022   1:15 PM Clinical Impressions Clinical Impression Pt demonstrates oropharygneal dysphagia with mild oral impairment secondary to right CN VII and XII weakness and potential right sided sensory changes. Pt has decreased labial seal on the right with anterior spillage, improved with cues to increase labial tension with straw or cup. There is also decreased bolus cohesion and mild lingual residue unless cued for an oral hold. Pt initiates swallow as the bolus arrives past the valleculae resulting in instances of flash penetration or sensed aspiration (consecutive straw sips). Pt improved with single sips and oral hold prior to swallow. There was questionable trace silent aspiration of light barium pooled in pyriforms; penetrated post swallow. However this finding is inconclusive and  likely negligible given pts sensation and ejection of mild aspirate in prior event. Recommend initiating nectar thick liquids and dys 3 solids  to aid in pt relearning swallowing motor patterns with a slower, more manageable texture prior to advancement to thin in 1-2 days if successful. 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);Dysphagia, unspecified (R13.10) Impact on safety and function Mild aspiration risk     01/30/2022   1:15 PM Treatment Recommendations Treatment Recommendations Therapy as outlined in treatment plan below     01/30/2022   1:15 PM Prognosis Prognosis for Safe Diet Advancement Good   01/30/2022   1:15 PM Diet Recommendations SLP Diet Recommendations Nectar thick liquid;Thin liquid;Dysphagia 3 (Mech soft) solids Liquid Administration via Cup;Straw Medication Administration Whole meds with puree Compensations Slow rate;Small sips/bites;Clear throat intermittently Postural Changes Remain semi-upright after after feeds/meals (Comment);Seated upright at 90 degrees     01/30/2022   1:15 PM  Other Recommendations Oral Care Recommendations Oral care BID Other Recommendations Have oral suction available Follow Up Recommendations Acute inpatient rehab (3hours/day) Assistance recommended at discharge Frequent or constant Supervision/Assistance Functional Status Assessment Patient has had a recent decline in their functional status and demonstrates the ability to make significant improvements in function in a reasonable and predictable amount of time.   01/30/2022   1:15 PM Frequency and Duration  Speech Therapy Frequency (ACUTE ONLY) min 2x/week Treatment Duration 2 weeks     01/30/2022   1:15 PM Oral Phase Oral Phase Impaired Oral - Thin Cup Decreased bolus cohesion Oral - Thin Straw Decreased bolus cohesion;Premature spillage;Lingual/palatal residue Oral - Puree WFL Oral - Regular Weak lingual manipulation;Lingual/palatal residue    01/30/2022   1:15 PM Pharyngeal Phase Pharyngeal Phase Impaired Pharyngeal- Nectar Cup Pharyngeal residue - pyriform Pharyngeal- Nectar Straw Pharyngeal residue - pyriform Pharyngeal- Thin Cup Pharyngeal residue - pyriform;Penetration/Aspiration during swallow;Penetration/Aspiration before swallow Pharyngeal Material enters airway, CONTACTS cords and not ejected out;Material enters airway, remains ABOVE vocal cords then ejected out;Material does not enter airway Pharyngeal- Thin Straw Delayed swallow initiation-pyriform sinuses;Penetration/Aspiration before swallow;Penetration/Apiration after swallow;Pharyngeal residue - pyriform;Trace aspiration Pharyngeal Material enters airway, passes BELOW cords then ejected out;Material enters airway, CONTACTS cords and not ejected out;Material does not enter airway     No data to display    Harlon Ditty, MA CCC-SLP Acute Rehabilitation Services Secure Chat Preferred Office [phone] Claudine Mouton 01/30/2022, 1:48 PM                     IR PERCUTANEOUS ART THROMBECTOMY/INFUSION INTRACRANIAL INC DIAG ANGIO  Result Date:  01/30/2022 INDICATION: 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 EXAM: 1. EMERGENT LARGE VESSEL OCCLUSION THROMBOLYSIS (anterior CIRCULATION) COMPARISON:  None Available. MEDICATIONS: No antibiotic was administered within 1 hour of the procedure. ANESTHESIA/SEDATION: General anesthesia. CONTRAST:  Omnipaque 300 150 mL. FLUOROSCOPY TIME:  Fluoroscopy Time: 39 minutes 12 seconds (1289 mGy). COMPLICATIONS: None immediate. TECHNIQUE: Following a full explanation of the procedure along with the potential associated complications, an informed witnessed consent was obtained. The risks of intracranial hemorrhage of 10%, worsening neurological deficit, ventilator dependency, death and inability to revascularize were all reviewed in detail with the patient's spouse. The patient was then put under general anesthesia by the Department of Anesthesiology at [facility]. The left groin was prepped and draped in the usual sterile fashion. Thereafter using modified Seldinger technique, transfemoral access into the left common femoral artery was obtained without difficulty. Over a 0.035 inch guidewire an 8 French 25 cm Pinnacle sheath was inserted. Through this, and also over a 0.035 inch guidewire a combination of a wrist Simmons 2 support catheter inside of an 087  balloon guide catheter was advanced to the aortic region, and selectively positioned initially in the left common carotid artery and then advanced to the distal cervical left ICA over an 035 inch guidewire with support catheter. The guidewire was removed. Good aspiration obtained hub of the balloon guide catheter. Arteriogram was then performed proximally from the common carotid artery then the distal left internal carotid artery. FINDINGS: The left common carotid arteriogram demonstrates the left external carotid artery and its major branches to be widely patent. The left internal carotid artery at bulb to the cranial skull base demonstrates wide  patency. Wide patency is noted of the petrous, the cavernous and the supraclinoid segments. Opacification of the left posterior cerebral artery via the left posterior communicating artery is noted. The left middle cerebral artery M1 segment is widely patent. The angiographic occlusion of the superior division mid M2 segment is noted. The left MCA inferior division opacifies into the capillary and venous phases. The left anterior cerebral artery opacifies into the capillary and venous phases. Transient cross-filling via the anterior communicating artery of the right anterior cerebral artery A2 segment and the A1 segment is evident. PROCEDURE: Over an Aristotle softip micro guidewire with a J configuration, a combination of a 132 cm Zoom aspiration catheter inside of which was a 160 cm Phenom microcatheter was advanced to the proximal left middle cerebral artery. Using a torque device, access was obtained with the micro guidewire through the occluded superior division into the proximal M3 segment followed by the microcatheter. The guidewire was removed. Good aspiration was obtained from the hub of the microcatheter. A gentle contrast injection demonstrated slow antegrade flow of contrast distally. A 3 mm x 36 mm retrieval device was then advanced to the distal end of the microcatheter and deployed in the usual manner. At this time the Zoom aspiration catheter was advanced into the proximal superior division. With proximal flow arrest in the left internal carotid artery constant aspiration was applied at the hub of the Zoom aspiration catheter and the aspiration device 2 minutes of the balloon guide catheter with a 20 mL syringe. Combination of the retrieval, the catheter and the Zoom aspiration catheter was retrieved removed. Following reversal of flow arrest, control arteriogram with the balloon guide catheter in the left internal carotid artery demonstrated near complete opacification of the superior division with  gradual improved flow into the M3 M4 segment of the superior division. Moderate spasm noted in the proximal superior division responded to 5 aliquotes of 25 mcg of nitroglycerin intra-arterially. Also noted was a small non flow limiting filling defect in a mid parietal branch of the inferior division in the distal M3 M4 region which also gradually started clearing over a period of 20 minutes. Control arteriogram performed through the balloon guide catheter in the mid cervical left demonstrated near complete revascularization of the left MCA achieving a TICI 2C revascularization. A control arteriogram performed through the left common carotid artery demonstrated small dissection of the distal left internal carotid artery at the skull base medially without any flow limitation. The balloon guide catheter was removed. Diagnostic catheter arteriogram was then performed of the right common carotid artery and the right vertebral artery. These demonstrated the right external carotid artery and its major branches to be widely patent. Internal carotid artery at the bulb to the cranial skull base demonstrates wide patency. The petrous, the cavernous and the supraclinoid segments demonstrate wide patency as do the right middle cerebral artery and the right anterior cerebral artery  into the capillary and venous phases. The right vertebral artery demonstrates wide patency. More distally, patency is maintained of the basilar junction in the right posterior-inferior cerebellar artery. The basilar artery, the posterior cerebral arteries, the superior cerebellar arteries and the anterior-inferior cerebellar arteries demonstrate patency into the capillary and venous phases. Unopacified blood was noted in the basilar artery from the contralateral left vertebral artery. An 8 French Angio-Seal closure device demonstrates hemostasis at the left groin puncture site. Distal pulses remained palpable in both feet unchanged. A CT of the brain  demonstrated no hemorrhagic complications. The patient was left intubated due to poor responsiveness following reversal of the anesthesia. Patient was then transferred to the neuro ICU post revascularization care. IMPRESSION: Status post endovascular revascularization of occluded division of the left middle cerebral artery achieving a TICI 2C revascularization of the left MCA distribution. Puncture to first pass approximately 36 mins. PLAN: Follow-up as per referring MD. Electronically Signed   By: Julieanne Cotton M.D.   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 EXAM: 1. EMERGENT LARGE VESSEL OCCLUSION THROMBOLYSIS (anterior CIRCULATION) COMPARISON:  None Available. MEDICATIONS: No antibiotic was administered within 1 hour of the procedure. ANESTHESIA/SEDATION: General anesthesia. CONTRAST:  Omnipaque 300 150 mL. FLUOROSCOPY TIME:  Fluoroscopy Time: 39 minutes 12 seconds (1289 mGy). COMPLICATIONS: None immediate. TECHNIQUE: Following a full explanation of the procedure along with the potential associated complications, an informed witnessed consent was obtained. The risks of intracranial hemorrhage of 10%, worsening neurological deficit, ventilator dependency, death and inability to revascularize were all reviewed in detail with the patient's spouse. The patient was then put under general anesthesia by the Department of Anesthesiology at [facility]. The left groin was prepped and draped in the usual sterile fashion. Thereafter using modified Seldinger technique, transfemoral access into the left common femoral artery was obtained without difficulty. Over a 0.035 inch guidewire an 8 French 25 cm Pinnacle sheath was inserted. Through this, and also over a 0.035 inch guidewire a combination of a wrist Simmons 2 support catheter inside of an 087 balloon guide catheter was advanced to the aortic region, and selectively  positioned initially in the left common carotid artery and then advanced to the distal cervical left ICA over an 035 inch guidewire with support catheter. The guidewire was removed. Good aspiration obtained hub of the balloon guide catheter. Arteriogram was then performed proximally from the common carotid artery then the distal left internal carotid artery. FINDINGS: The left common carotid arteriogram demonstrates the left external carotid artery and its major branches to be widely patent. The left internal carotid artery at bulb to the cranial skull base demonstrates wide patency. Wide patency is noted of the petrous, the cavernous and the supraclinoid segments. Opacification of the left posterior cerebral artery via the left posterior communicating artery is noted. The left middle cerebral artery M1 segment is widely patent. The angiographic occlusion of the superior division mid M2 segment is noted. The left MCA inferior division opacifies into the capillary and venous phases. The left anterior cerebral artery opacifies into the capillary and venous phases. Transient cross-filling via the anterior communicating artery of the right anterior cerebral artery A2 segment and the A1 segment is evident. PROCEDURE: Over an Aristotle softip micro guidewire with a J configuration, a combination of a 132 cm Zoom aspiration catheter inside of which was a 160 cm Phenom microcatheter was advanced to the proximal left  middle cerebral artery. Using a torque device, access was obtained with the micro guidewire through the occluded superior division into the proximal M3 segment followed by the microcatheter. The guidewire was removed. Good aspiration was obtained from the hub of the microcatheter. A gentle contrast injection demonstrated slow antegrade flow of contrast distally. A 3 mm x 36 mm retrieval device was then advanced to the distal end of the microcatheter and deployed in the usual manner. At this time the Zoom  aspiration catheter was advanced into the proximal superior division. With proximal flow arrest in the left internal carotid artery constant aspiration was applied at the hub of the Zoom aspiration catheter and the aspiration device 2 minutes of the balloon guide catheter with a 20 mL syringe. Combination of the retrieval, the catheter and the Zoom aspiration catheter was retrieved removed. Following reversal of flow arrest, control arteriogram with the balloon guide catheter in the left internal carotid artery demonstrated near complete opacification of the superior division with gradual improved flow into the M3 M4 segment of the superior division. Moderate spasm noted in the proximal superior division responded to 5 aliquotes of 25 mcg of nitroglycerin intra-arterially. Also noted was a small non flow limiting filling defect in a mid parietal branch of the inferior division in the distal M3 M4 region which also gradually started clearing over a period of 20 minutes. Control arteriogram performed through the balloon guide catheter in the mid cervical left demonstrated near complete revascularization of the left MCA achieving a TICI 2C revascularization. A control arteriogram performed through the left common carotid artery demonstrated small dissection of the distal left internal carotid artery at the skull base medially without any flow limitation. The balloon guide catheter was removed. Diagnostic catheter arteriogram was then performed of the right common carotid artery and the right vertebral artery. These demonstrated the right external carotid artery and its major branches to be widely patent. Internal carotid artery at the bulb to the cranial skull base demonstrates wide patency. The petrous, the cavernous and the supraclinoid segments demonstrate wide patency as do the right middle cerebral artery and the right anterior cerebral artery into the capillary and venous phases. The right vertebral artery  demonstrates wide patency. More distally, patency is maintained of the basilar junction in the right posterior-inferior cerebellar artery. The basilar artery, the posterior cerebral arteries, the superior cerebellar arteries and the anterior-inferior cerebellar arteries demonstrate patency into the capillary and venous phases. Unopacified blood was noted in the basilar artery from the contralateral left vertebral artery. An 8 French Angio-Seal closure device demonstrates hemostasis at the left groin puncture site. Distal pulses remained palpable in both feet unchanged. A CT of the brain demonstrated no hemorrhagic complications. The patient was left intubated due to poor responsiveness following reversal of the anesthesia. Patient was then transferred to the neuro ICU post revascularization care. IMPRESSION: Status post endovascular revascularization of occluded division of the left middle cerebral artery achieving a TICI 2C revascularization of the left MCA distribution. Puncture to first pass approximately 36 mins. PLAN: Follow-up as per referring MD. Electronically Signed   By: Julieanne Cotton M.D.   On: 01/30/2022 08:42   MR BRAIN WO CONTRAST  Result Date: 01/28/2022 CLINICAL DATA:  Right MCA stroke status post thrombectomy, follow-up EXAM: MRI HEAD WITHOUT CONTRAST MRA HEAD WITHOUT CONTRAST TECHNIQUE: Multiplanar, multi-echo pulse sequences of the brain and surrounding structures were acquired without intravenous contrast. Angiographic images of the Circle of Willis were acquired using MRA technique without  intravenous contrast. COMPARISON:  01/22/2022 MRI head, no prior MRA, correlation is made with CTA head and neck 01/21/2022 FINDINGS: Brain: New foci of restricted diffusion with ADC correlate in the left posterior frontal and anterior parietal cortex (series 5, images 80 8-95). Redemonstrated focus of restricted diffusion with ADC correlate in the left insula (series 5, image 76 and series 6, image  26). Other previously noted foci of restricted diffusion are decreased in conspicuity and do not have persistent ADC correlates. No acute hemorrhage, mass, mass effect, or midline shift. No hydrocephalus or extra-axial collection. Punctate focus of hemosiderin deposition in the left posterior frontal lobe. Minimal T2 hyperintense signal in the periventricular Muhl matter and pons, likely the sequela of mild chronic small vessel ischemic disease. Vascular: Normal arterial flow voids. Skull and upper cervical spine: Normal marrow signal. Sinuses/Orbits: Minimal mucosal thickening in the ethmoid air cells. The orbits are unremarkable. Other: The mastoids are well aerated. MRA HEAD FINDINGS Anterior circulation: Both internal carotid arteries are patent to the termini, without significant stenosis. A1 segments patent. Normal anterior communicating artery. Anterior cerebral arteries are patent to their distal aspects. No M1 stenosis or occlusion. Distal MCA branches perfused and symmetric.In particular, the left MCA branches appear perfused and without focal stenosis. Posterior circulation: Vertebral arteries patent to the vertebrobasilar junction without stenosis. Basilar patent to its distal aspect. Superior cerebellar arteries patent bilaterally. Patent P1 segments. PCAs perfused to their distal aspects without stenosis. Diminutive left posterior communicating artery. The right posterior communicating artery is not definitively visualized. Anatomic variants: None significant IMPRESSION: 1. 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 2. No intracranial large vessel occlusion or significant stenosis. In particular, left MCA branches appear perfused and without focal stenosis. These results will be called to the ordering clinician or representative by the Radiologist Assistant, and communication documented in the PACS or [employer]. Electronically Signed   By: Wiliam Ke M.D.    On: 01/28/2022 23:40   MR ANGIO HEAD WO CONTRAST  Result Date: 01/28/2022 CLINICAL DATA:  Right MCA stroke status post thrombectomy, follow-up EXAM: MRI HEAD WITHOUT CONTRAST MRA HEAD WITHOUT CONTRAST TECHNIQUE: Multiplanar, multi-echo pulse sequences of the brain and surrounding structures were acquired without intravenous contrast. Angiographic images of the Circle of Willis were acquired using MRA technique without intravenous contrast. COMPARISON:  01/22/2022 MRI head, no prior MRA, correlation is made with CTA head and neck 01/21/2022 FINDINGS: Brain: New foci of restricted diffusion with ADC correlate in the left posterior frontal and anterior parietal cortex (series 5, images 80 8-95). Redemonstrated focus of restricted diffusion with ADC correlate in the left insula (series 5, image 76 and series 6, image 26). Other previously noted foci of restricted diffusion are decreased in conspicuity and do not have persistent ADC correlates. No acute hemorrhage, mass, mass effect, or midline shift. No hydrocephalus or extra-axial collection. Punctate focus of hemosiderin deposition in the left posterior frontal lobe. Minimal T2 hyperintense signal in the periventricular Cullins matter and pons, likely the sequela of mild chronic small vessel ischemic disease. Vascular: Normal arterial flow voids. Skull and upper cervical spine: Normal marrow signal. Sinuses/Orbits: Minimal mucosal thickening in the ethmoid air cells. The orbits are unremarkable. Other: The mastoids are well aerated. MRA HEAD FINDINGS Anterior circulation: Both internal carotid arteries are patent to the termini, without significant stenosis. A1 segments patent. Normal anterior communicating artery. Anterior cerebral arteries are patent to their distal aspects. No M1 stenosis or occlusion. Distal MCA branches  perfused and symmetric.In particular, the left MCA branches appear perfused and without focal stenosis. Posterior circulation: Vertebral  arteries patent to the vertebrobasilar junction without stenosis. Basilar patent to its distal aspect. Superior cerebellar arteries patent bilaterally. Patent P1 segments. PCAs perfused to their distal aspects without stenosis. Diminutive left posterior communicating artery. The right posterior communicating artery is not definitively visualized. Anatomic variants: None significant IMPRESSION: 1. 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 2. No intracranial large vessel occlusion or significant stenosis. In particular, left MCA branches appear perfused and without focal stenosis. These results will be called to the ordering clinician or representative by the Radiologist Assistant, and communication documented in the PACS or [employer]. Electronically Signed   By: Wiliam Ke M.D.   On: 01/28/2022 23:40   [place] EKG SITE RITE  Result Date: 01/28/2022 If Site Rite image not attached, placement could not be confirmed due to current cardiac rhythm.  DG Abd 1 View  Result Date: 01/28/2022 CLINICAL DATA:  Evaluate OG tube placement EXAM: ABDOMEN - 1 VIEW COMPARISON:  None Available. FINDINGS: The OG tube terminates in the left upper quadrant, in the region of the stomach. IMPRESSION: The OG tube appears to terminate in the stomach. No other abnormalities. Electronically Signed   By: Gerome Sam III M.D.   On: 01/28/2022 17:28   Portable Chest x-ray  Result Date: 01/28/2022 CLINICAL DATA:  [id number]. Encounter for ETT placement. EXAM: PORTABLE CHEST 1 VIEW COMPARISON:  Chest x-ray 01/26/2022, CT heart 01/23/2022 FINDINGS: Enteric tube with tip terminating 3 cm above the carina. Slightly more prominent aortic arch and aortic knob likely due to positioning and AP portable technique. Otherwise the heart and mediastinal contours are unchanged normal limits. No focal consolidation. No pulmonary edema. No pleural effusion. No pneumothorax. No acute osseous abnormality.  IMPRESSION: 1. Slightly more prominent aortic arch and aortic knob likely due to positioning and AP portable technique. Finding can likely be further evaluated on CT angio head and neck 01/28/2022. 2. No active disease. Electronically Signed   By: Tish Frederickson M.D.   On: 01/28/2022 17:21   CT HEAD CODE STROKE WO CONTRAST  Result Date: 01/28/2022 CLINICAL DATA:  Code stroke.  Neuro deficit, acute, stroke suspected EXAM: CT HEAD WITHOUT CONTRAST TECHNIQUE: Contiguous axial images were obtained from the base of the skull through the vertex without intravenous contrast. RADIATION DOSE REDUCTION: This exam was performed according to the departmental dose-optimization program which includes automated exposure control, adjustment of the mA and/or kV according to patient size and/or use of iterative reconstruction technique. COMPARISON:  CT head 01/21/2022. FINDINGS: Brain: No evidence of acute large vascular territory infarction, hemorrhage, hydrocephalus, extra-axial collection or mass lesion/mass effect. Vascular: No hyperdense vessel identified. Skull: No acute fracture. Sinuses/Orbits: Clear sinuses.  No acute orbital findings. Other: No mastoid effusions. ASPECTS [facility] Stroke Program Early CT Score) total score (0-10 with 10 being normal): 10. IMPRESSION: 1. No evidence of acute intracranial abnormality. 2. ASPECTS is 10. Code stroke imaging results were communicated on 01/28/2022 at 1:43 pm to provider Bhagat via secure text paging. Electronically Signed   By: Feliberto Harts M.D.   On: 01/28/2022 13:43    Labs:  CBC: Recent Labs    01/28/22 1330 01/28/22 1337 01/28/22 1747 01/29/22 0300 01/30/22 0143 01/31/22 0415  WBC 4.7  --   --  8.6 5.3 5.2  HGB 12.0   < > 10.2* 11.2* 9.2* 9.3*  HCT 35.5*   < >  30.0* 33.3* 27.3* 28.0*  PLT 171  --   --  173 143* 157   < > = values in this interval not displayed.     COAGS: Recent Labs    01/21/22 1445 01/28/22 1330 01/29/22 0300 01/29/22 1025  01/29/22 1745 01/30/22 0143 01/30/22 1000 01/30/22 1344 01/31/22 0415  INR 1.1 1.2  --   --   --   --   --  1.1 1.2  APTT  --  24   < > 39* 58* 77* 72*  --   --    < > = values in this interval not displayed.     BMP: Recent Labs    01/29/22 0845 01/30/22 0143 01/30/22 1620 01/31/22 0415  NA 138 139 138 139  K 3.5 2.9* 3.5 3.1*  CL 110 112* 109 109  CO2 20* 21* 22 24  GLUCOSE 148* 100* 98 92  BUN 6* <5* <5* <5*  CALCIUM 8.8* 8.3* 8.6* 8.6*  CREATININE 0.56 0.65 0.56 0.61  GFRNONAA >60 >60 >60 >60     LIVER FUNCTION TESTS: Recent Labs    01/21/22 1445 01/28/22 1330  BILITOT 1.0 0.3  AST 19 22  ALT 16 18  ALKPHOS 47 45  PROT 6.8 6.4*  ALBUMIN 4.1 3.9     Assessment and Plan:  66 y.o. female with recurrent left M2/MCA occlusion, s/p mechanical thrombectomy on 8/29 by Dr. Tommie Sams (TICI 2C) and on 9/5  by [name] (TICI 2C)  Showing daily progress, able to lift RUE against gravity.  PT has signed off, continues efforts with SLP/OT.  OT recommended inpatient rehab.   Further treatment plan per Stroke team  Appreciate and agree with the plan.    Electronically Signed: Hoyt Koch, PA 01/31/2022, 3:38 PM   I spent a total of 15 Minutes at the the patient's bedside AND on the patient's hospital floor or unit, greater than 50% of which was counseling/coordinating care for stroke f/u, recurrent Left M2/MCA occlusion.

## 2022-01-31 NOTE — Progress Notes (Signed)
Inpatient Rehab Admissions:  Inpatient Rehab Consult received.  I met with patient, husband Juanda Crumble, and daughter Morene Antu at the bedside for rehabilitation assessment and to discuss goals and expectations of an inpatient rehab admission.  Pt interested in pursuing CIR and family is supportive. Discussed average length of stay, visitation policy, insurance authorization requirement with her insurance. All acknowledged understanding. Husband and daughter confirmed that family and friends will be available to provide 24/7 support for pt after discharge. Will continue to follow.  Signed: Gayland Curry, [place], [place] Admissions Coordinator [phone]

## 2022-01-31 NOTE — Progress Notes (Signed)
Speech Language Pathology Treatment: Dysphagia;Cognitive-Linquistic  Patient Details Name: Chloe Johnson MRN: [id number] DOB: [birth year] Today's Date: 01/31/2022 Time: 1000-1038 SLP Time Calculation (min) (ACUTE ONLY): 38 min  Assessment / Plan / Recommendation Clinical Impression  Pt and spouse apparently did not understand that pt is allowed to eat food. She reports shy usually only eats one meal a day; but pt has not eaten in days. Pt spelled with board that she is depressed about only drinking liquids. SLP again reinforced results of MBS with pt, husband and daughter: the pt is able to masticate soft foods, She had a slight timing impairment that led to a very trace sensed aspiration event. Practice swallowing is important to retrain appropriate motor movements for eating and drinking. Pt demonstrates sipping on thickened juice and then water. Will lift dietary restrictions to encourage intake given that there is minimal risk of aspiration. Used pictures of menu items to help pt select some foods. Gave pt verbal support while dialing a phone number; she was unable to use working memory with number written out to dial a number, needed to be told each number verbally. Used a Ship broker and articulatory placement cues to guide pt in shaping /mama/ /la/ and /na/ tried to carry this to no without success. Pt not yet able to achieve stops or plosive phonemes. Instructed practice 3 x a day. Showed daughter apraxia app. Recommend AIR for intensive interventions.   HPI HPI: Patient is a 66 y.o. 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 On 01/25/22, patient began to have symptoms of dizziness followed by nausea and vomiting. Symptoms improved but then on 9/5 but she felt generally weak and was having trouble finding words. Husband observed right facial  droop. She presented to [facility] ED as code stroke on 01/28/22; CT head without acute abnormality but CTA unable to be performed due to IV infiltration. She was taken directly to IR for thrombectomy and intubated for this procedure, extubated in morning of 01/29/22. MRI brain showed new acute infarcts in left posterior frontal and anterior parietal cortex.      SLP Plan  Continue with current plan of care      Recommendations for follow up therapy are one component of a multi-disciplinary discharge planning process, led by the attending physician.  Recommendations may be updated based on patient status, additional functional criteria and insurance authorization.    Recommendations                   Oral Care Recommendations: Oral care BID Follow Up Recommendations: Acute inpatient rehab (3hours/day) Assistance recommended at discharge: Frequent or constant Supervision/Assistance SLP Visit Diagnosis: Apraxia (R48.2);Dysphagia, unspecified (R13.10) Plan: Continue with current plan of care           Chloe Johnson, Chloe Johnson  01/31/2022, 10:47 AM

## 2022-01-31 NOTE — Discharge Instructions (Signed)
Information on my medicine - Coumadin   (Warfarin)  This medication education was reviewed with me or my healthcare representative as part of my discharge preparation.    Why was Coumadin prescribed for you? Coumadin was prescribed for you because you have a blood clot or a medical condition that can cause an increased risk of forming blood clots. Blood clots can cause serious health problems by blocking the flow of blood to the heart, lung, or brain. Coumadin can prevent harmful blood clots from forming. As a reminder your indication for Coumadin is:   blood clot (left ventricular thrombus)  What test will check on my response to Coumadin? While on Coumadin (warfarin) you will need to have an INR test regularly to ensure that your dose is keeping you in the desired range. The INR (international normalized ratio) number is calculated from the result of the laboratory test called prothrombin time (PT).  If an INR APPOINTMENT HAS NOT ALREADY BEEN MADE FOR YOU please schedule an appointment to have this lab work done by your health care provider within 7 days. Your INR goal is usually a number between:  2 to 3 or your provider may give you a more narrow range like 2-2.5.  Ask your health care provider during an office visit what your goal INR is.  What  do you need to  know  About  COUMADIN? Take Coumadin (warfarin) exactly as prescribed by your healthcare provider about the same time each day.  DO NOT stop taking without talking to the doctor who prescribed the medication.  Stopping without other blood clot prevention medication to take the place of Coumadin may increase your risk of developing a new clot or stroke.  Get refills before you run out.  What do you do if you miss a dose? If you miss a dose, take it as soon as you remember on the same day then continue your regularly scheduled regimen the next day.  Do not take two doses of Coumadin at the same time.  Important Safety Information A  possible side effect of Coumadin (Warfarin) is an increased risk of bleeding. You should call your healthcare provider right away if you experience any of the following: Bleeding from an injury or your nose that does not stop. Unusual colored urine (red or dark brown) or unusual colored stools (red or black). Unusual bruising for unknown reasons. A serious fall or if you hit your head (even if there is no bleeding).  Some foods or medicines interact with Coumadin (warfarin) and might alter your response to warfarin. To help avoid this: Eat a balanced diet, maintaining a consistent amount of Vitamin K. Notify your provider about major diet changes you plan to make. Avoid alcohol or limit your intake to 1 drink for women and 2 drinks for men per day. (1 drink is 5 oz. wine, 12 oz. beer, or 1.5 oz. liquor.)  Make sure that ANY health care provider who prescribes medication for you knows that you are taking Coumadin (warfarin).  Also make sure the healthcare provider who is monitoring your Coumadin knows when you have started a new medication including herbals and non-prescription products.  Coumadin (Warfarin)  Major Drug Interactions  Increased Warfarin Effect Decreased Warfarin Effect  Alcohol (large quantities) Antibiotics (esp. Septra/Bactrim, Flagyl, Cipro) Amiodarone (Cordarone) Aspirin (ASA) Cimetidine (Tagamet) Megestrol (Megace) NSAIDs (ibuprofen, naproxen, etc.) Piroxicam (Feldene) Propafenone (Rythmol SR) Propranolol (Inderal) Isoniazid (INH) Posaconazole (Noxafil) Barbiturates (Phenobarbital) Carbamazepine (Tegretol) Chlordiazepoxide (Librium) Cholestyramine (Questran) Griseofulvin Oral  Contraceptives Rifampin Sucralfate (Carafate) Vitamin K   Coumadin (Warfarin) Major Herbal Interactions  Increased Warfarin Effect Decreased Warfarin Effect  Garlic Ginseng Ginkgo biloba Coenzyme Q10 Green tea St. John's wort    Coumadin (Warfarin) FOOD Interactions  Eat a  consistent number of servings per week of foods HIGH in Vitamin K (1 serving =  cup)  Collards (cooked, or boiled & drained) Kale (cooked, or boiled & drained) Mustard greens (cooked, or boiled & drained) Parsley *serving size only =  cup Spinach (cooked, or boiled & drained) Swiss chard (cooked, or boiled & drained) Turnip greens (cooked, or boiled & drained)  Eat a consistent number of servings per week of foods MEDIUM-HIGH in Vitamin K (1 serving = 1 cup)  Asparagus (cooked, or boiled & drained) Broccoli (cooked, boiled & drained, or raw & chopped) Brussel sprouts (cooked, or boiled & drained) *serving size only =  cup Lettuce, raw (green leaf, endive, romaine) Spinach, raw Turnip greens, raw & chopped   These websites have more information on Coumadin (warfarin):  http://www.king-russell.com/; https://www.hines.net/;

## 2022-01-31 NOTE — Progress Notes (Addendum)
Occupational Therapy Treatment Patient Details Name: LEVETTE PAULICK MRN: [id number] DOB: July 30, 1955 Today's Date: 01/31/2022   History of present illness Pt is a 66 y.o. female who presented 01/28/22 with weakness and aphasia. S/p complete revascularization of occluded superior division of the left middle cerebral artery M2 segment with 1 pass with a 3 mm x 40 mm treatment retrieval device and proximal aspiration achieving aTICI 2C revascularization 9/5. ETT 9/5-9/6. MRI revealed new acute infarcts in the left posterior frontal and anterior parietal cortex. Of note, pt admitted 01/21/22-01/23/22 for small acute infarct in the left frontal Se[place] matter. PMH: HTN, cervical radiculopathy, hepatitis, osteomyelitis/discitis, lumbar stenosis with foot drop anxiety, GERD, insomnia   OT comments  Pt. Seen for skilled OT treatment session.  Husband present and very attentive and active in pts. Care.  Pt. Performing grooming tasks in standing with min/mod a with efforts to integrate use of RUE as able.  Pt. Gesturing to "act out" her questions or comments.  Attempts to vocalize with partial sound when trying to say the word "thumb" (md/np present during session) pt. Actively attempting conversation/questions with them.  Shaking head no but pointing and gesturing yes direction with head when they would say the word or topic she was wanting to know about.  Pt. Remains EXCELLENT candidate for AIR level therapies prior to home.  Is showing daily progress that demonstrates her ability and want for continued OT/ST prior to home.     Recommendations for follow up therapy are one component of a multi-disciplinary discharge planning process, led by the attending physician.  Recommendations may be updated based on patient status, additional functional criteria and insurance authorization.    Follow Up Recommendations  Acute inpatient rehab (3hours/day)    Assistance Recommended at Discharge Frequent or constant  Supervision/Assistance  Patient can return home with the following  A little help with walking and/or transfers;A little help with bathing/dressing/bathroom;Assistance with cooking/housework;Direct supervision/assist for medications management;Direct supervision/assist for financial management;Assist for transportation;Help with stairs or ramp for entrance   Equipment Recommendations  Other (comment) (pts. spouse reports they have shower seat and rw at home)    Recommendations for Other Services Rehab consult    Precautions / Restrictions Precautions Precaution Comments: SBP goal < 160       Mobility Bed Mobility                    Transfers                         Balance                                           ADL either performed or assessed with clinical judgement   ADL Overall ADL's : Needs assistance/impaired     Grooming: Minimal assistance;Moderate assistance;Standing Grooming Details (indicate cue type and reason): focus on integration of R hand/ue as much as possible during tasks                             Functional mobility during ADLs: Min guard General ADL Comments: assist due to flaccid RUE and aphasic-educaton on use of RUE to stabalize items and/ or use together ie: to bring cup to mouth.  husband present and active historian and also active in learning how to assist  pt.    Extremity/Trunk Assessment              Vision       Perception     Praxis      Cognition Arousal/Alertness: Awake/alert                                     General Comments: Pt with expressive communication deficits, limiting cognitive assessment some. Pt follows commands appropriately.  able to nod "yes" or "no" when i was trying to decipher her gestural cues when she was trying to ask or say something. she will point when you get it right and nod        Exercises      Shoulder Instructions        General Comments  Husband states and pt. 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      Pertinent Vitals/ Pain       Pain Assessment Pain Assessment: No/denies pain  Home Living                                          Prior Functioning/Environment              Frequency  Min 2X/week        Progress Toward Goals  OT Goals(current goals can now be found in the care plan section)  Progress towards OT goals: Progressing toward goals     Plan Discharge plan remains appropriate    Co-evaluation                 AM-PAC OT "6 Clicks" Daily Activity     Outcome Measure   Help from another person eating meals?: A Little Help from another person taking care of personal grooming?: A Little Help from another person toileting, which includes using toliet, bedpan, or urinal?: A Little Help from another person bathing (including washing, rinsing, drying)?: A Little Help from another person to put on and taking off regular upper body clothing?: A Little Help from another person to put on and taking off regular lower body clothing?: A Little 6 Click Score: 18    End of Session Equipment Utilized During Treatment: Gait belt  OT Visit Diagnosis: Unsteadiness on feet (R26.81);Cognitive communication deficit (R41.841);Other abnormalities of gait and mobility (R26.89);Hemiplegia and hemiparesis Symptoms and signs involving cognitive functions: Cerebral infarction Hemiplegia - Right/Left: Right Hemiplegia - dominant/non-dominant: Dominant Hemiplegia - caused by: Cerebral infarction   Activity Tolerance Patient tolerated treatment well   Patient Left in bed;with call bell/phone within reach;with family/visitor present   Nurse Communication          Time: 6387-5643 OT Time Calculation (min): 31 min  Charges: OT General Charges $OT Visit: 1 Visit OT Treatments $Self  Care/Home Management : 23-37 mins  Boneta Lucks, COTA/L Acute Rehabilitation [phone]   Salvadore Oxford 01/31/2022, 11:36 AM

## 2022-01-31 NOTE — Progress Notes (Addendum)
STROKE TEAM PROGRESS NOTE   SUBJECTIVE (INTERVAL HISTORY) Family is at the bedside. Family is upset because were just told that she does not qualify for CIR. Patient is sitting up in bed. She is able to track, She is alert and oriented x 4 to choices. Right side facial droop, expressive aphasia, follows commands. Right arm 3/5 can lift antigravity, no grip or finger wiggle. Sensation is symmetric.   K 3.1 will replace and check in am .  No new neurological events overnight. Neurological exam unchanged VSS  OBJECTIVE Temp:  [97.8 F (36.6 C)-98.6 F (37 C)] 98.2 F (36.8 C) (09/08 0800) Pulse Rate:  [72-89] 83 (09/08 0800) Cardiac Rhythm: Normal sinus rhythm (09/07 1900) Resp:  [16-22] 16 (09/08 0800) BP: (109-148)/(74-90) 118/74 (09/08 0800) SpO2:  [98 %-100 %] 99 % (09/08 0800)  Recent Labs  Lab 01/28/22 1328 01/28/22 1747  GLUCAP 125* 103*    Recent Labs  Lab 01/28/22 1330 01/28/22 1337 01/28/22 1747 01/29/22 0845 01/30/22 0143 01/30/22 1620 01/31/22 0415  NA 141 138 142 138 139 138 139  K 3.8 4.0 3.7 3.5 2.9* 3.5 3.1*  CL 109 109  --  110 112* 109 109  CO2 23  --   --  20* 21* 22 24  GLUCOSE 115* 110*  --  148* 100* 98 92  BUN 8 10  --  6* <5* <5* <5*  CREATININE 0.84 0.70  --  0.56 0.65 0.56 0.61  CALCIUM 9.4  --   --  8.8* 8.3* 8.6* 8.6*  MG  --   --   --   --   --  1.9  --   PHOS  --   --   --   --   --  1.8*  --     Recent Labs  Lab 01/28/22 1330  AST 22  ALT 18  ALKPHOS 45  BILITOT 0.3  PROT 6.4*  ALBUMIN 3.9    Recent Labs  Lab 01/26/22 1118 01/28/22 1330 01/28/22 1337 01/28/22 1747 01/29/22 0300 01/30/22 0143 01/31/22 0415  WBC 5.4 4.7  --   --  8.6 5.3 5.2  NEUTROABS  --  2.5  --   --  6.1  --   --   HGB 13.8 12.0 11.9* 10.2* 11.2* 9.2* 9.3*  HCT 44.1 35.5* 35.0* 30.0* 33.3* 27.3* 28.0*  MCV 93.2 88.5  --   --  87.6 86.4 87.0  PLT 170 171  --   --  173 143* 157    No results for input(s): "CKTOTAL", "CKMB", "CKMBINDEX", "TROPONINI"  in the last 168 hours. Recent Labs    01/28/22 1330 01/30/22 1344 01/31/22 0415  LABPROT 15.0 14.4 14.9  INR 1.2 1.1 1.2    Recent Labs    01/28/22 1831  COLORURINE YELLOW  LABSPEC >1.046*  PHURINE 5.0  GLUCOSEU NEGATIVE  HGBUR NEGATIVE  BILIRUBINUR NEGATIVE  KETONESUR NEGATIVE  PROTEINUR 30*  NITRITE NEGATIVE  LEUKOCYTESUR NEGATIVE        Component Value Date/Time   CHOL 223 (H) 01/22/2022 0351   TRIG 271 (H) 01/29/2022 0300   HDL 40 (L) 01/22/2022 0351   CHOLHDL 5.6 01/22/2022 0351   VLDL 33 01/22/2022 0351   LDLCALC 150 (H) 01/22/2022 0351   Lab Results  Component Value Date   HGBA1C 5.0 01/22/2022      Component Value Date/Time   LABOPIA NONE DETECTED 01/28/2022 1831   COCAINSCRNUR NONE DETECTED 01/28/2022 1831   LABBENZ NONE DETECTED 01/28/2022 1831  AMPHETMU NONE DETECTED 01/28/2022 1831   THCU POSITIVE (A) 01/28/2022 1831   LABBARB NONE DETECTED 01/28/2022 1831    Recent Labs  Lab 01/28/22 1330  ETH <10     I have personally reviewed the radiological images below and agree with the radiology interpretations.  DG Swallowing Func-Speech Pathology  Result Date: 01/30/2022 Table formatting from the original result was not included. Images from the original result were not included. Objective Swallowing Evaluation: Type of Study: MBS-Modified Barium Swallow Study  Patient Details Name: AMMIE WARRICK MRN: [id number] Date of Birth: 05-15-1956 Today's Date: 01/30/2022 Time: SLP Start Time (ACUTE ONLY): 1135 -SLP Stop Time (ACUTE ONLY): 1200 SLP Time Calculation (min) (ACUTE ONLY): 25 min Past Medical History: Past Medical History: Diagnosis Date  Anxiety   Back pain   Discitis of lumbar region 11/16/2013  L3-4/notes 11/24/2013, arms, neck  Exertional asthma   GERD (gastroesophageal reflux disease)   Hypertension   Kidney stones   "have always passed them"  Neck pain   Osteomyelitis ([place]) 11/23/2013  osteomyelitis, discitis   Sleep concern   uses Prozac for sleep   Past Surgical History: Past Surgical History: Procedure Laterality Date  BREAST CYST EXCISION Left 2010  "polypectomy"  IR CT HEAD LTD  01/21/2022  IR CT HEAD LTD  01/28/2022  IR PERCUTANEOUS ART THROMBECTOMY/INFUSION INTRACRANIAL INC DIAG ANGIO  01/21/2022  IR PERCUTANEOUS ART THROMBECTOMY/INFUSION INTRACRANIAL INC DIAG ANGIO  01/28/2022  IR US GUIDE VASC ACCESS RIGHT  01/22/2022  PICC LINE PLACE PERIPHERAL ([place] HX) Right   for use of Levaquin & Vancomycin, of note: she reports that she had a "flulike feeling"   RADIOLOGY WITH ANESTHESIA N/A 01/21/2022  Procedure: IR WITH ANESTHESIA;  Surgeon: Luanne Bras, MD;  Location: [place];  Service: Radiology;  Laterality: N/A;  RADIOLOGY WITH ANESTHESIA N/A 01/28/2022  Procedure: IR WITH ANESTHESIA;  Surgeon: Radiologist, Medication, MD;  Location: [place];  Service: Radiology;  Laterality: N/A;  TONSILLECTOMY AND ADENOIDECTOMY  1975  TUBAL LIGATION  1999 HPI: Patient is a 66 y.o. 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 On 01/25/22, patient began to have symptoms of dizziness followed by nausea and vomiting. Symptoms improved but then on 9/5 but she felt generally weak and was having trouble finding words. Husband observed right facial droop. She [facility] ED as code stroke on 01/28/22; CT head without acute abnormality but CTA unable to be performed due to IV infiltration. She was taken directly to IR for thrombectomy and intubated for this procedure, extubated in morning of 01/29/22. MRI brain showed new acute infarcts in left posterior frontal and anterior parietal cortex.  Subjective: awake, alert, husband and two daughters present in room  Recommendations for follow up therapy are one component of a multi-disciplinary discharge planning process, led by the attending physician.  Recommendations may be  updated based on patient status, additional functional criteria and insurance authorization. Assessment / Plan / Recommendation   01/30/2022   1:15 PM Clinical Impressions Clinical Impression Pt demonstrates oropharygneal dysphagia with mild oral impairment secondary to right CN VII and XII weakness and potential right sided sensory changes. Pt has decreased labial seal on the right with anterior spillage, improved with cues to increase labial tension with straw or cup. There is also decreased bolus cohesion and mild lingual residue unless cued for an oral  hold. Pt initiates swallow as the bolus arrives past the valleculae resulting in instances of flash penetration or sensed aspiration (consecutive straw sips). Pt improved with single sips and oral hold prior to swallow. There was questionable trace silent aspiration of light barium pooled in pyriforms; penetrated post swallow. However this finding is inconclusive and  likely negligible given pts sensation and ejection of mild aspirate in prior event. Recommend initiating nectar thick liquids and dys 3 solids to aid in pt relearning swallowing motor patterns with a slower, more manageable texture prior to advancement to thin in 1-2 days if successful. 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);Dysphagia, unspecified (R13.10) Impact on safety and function Mild aspiration risk     01/30/2022   1:15 PM Treatment Recommendations Treatment Recommendations Therapy as outlined in treatment plan below     01/30/2022   1:15 PM Prognosis Prognosis for Safe Diet Advancement Good   01/30/2022   1:15 PM Diet Recommendations SLP Diet Recommendations Nectar thick liquid;Thin liquid;Dysphagia 3 (Mech soft) solids Liquid Administration via Cup;Straw Medication Administration Whole meds with puree Compensations Slow rate;Small sips/bites;Clear throat intermittently Postural Changes Remain semi-upright after after feeds/meals (Comment);Seated upright at 90 degrees     01/30/2022   1:15 PM Other  Recommendations Oral Care Recommendations Oral care BID Other Recommendations Have oral suction available Follow Up Recommendations Acute inpatient rehab (3hours/day) Assistance recommended at discharge Frequent or constant Supervision/Assistance Functional Status Assessment Patient has had a recent decline in their functional status and demonstrates the ability to make significant improvements in function in a reasonable and predictable amount of time.   01/30/2022   1:15 PM Frequency and Duration  Speech Therapy Frequency (ACUTE ONLY) min 2x/week Treatment Duration 2 weeks     01/30/2022   1:15 PM Oral Phase Oral Phase Impaired Oral - Thin Cup Decreased bolus cohesion Oral - Thin Straw Decreased bolus cohesion;Premature spillage;Lingual/palatal residue Oral - Puree WFL Oral - Regular Weak lingual manipulation;Lingual/palatal residue    01/30/2022   1:15 PM Pharyngeal Phase Pharyngeal Phase Impaired Pharyngeal- Nectar Cup Pharyngeal residue - pyriform Pharyngeal- Nectar Straw Pharyngeal residue - pyriform Pharyngeal- Thin Cup Pharyngeal residue - pyriform;Penetration/Aspiration during swallow;Penetration/Aspiration before swallow Pharyngeal Material enters airway, CONTACTS cords and not ejected out;Material enters airway, remains ABOVE vocal cords then ejected out;Material does not enter airway Pharyngeal- Thin Straw Delayed swallow initiation-pyriform sinuses;Penetration/Aspiration before swallow;Penetration/Apiration after swallow;Pharyngeal residue - pyriform;Trace aspiration Pharyngeal Material enters airway, passes BELOW cords then ejected out;Material enters airway, CONTACTS cords and not ejected out;Material does not enter airway     No data to display    Herbie Baltimore, MA CCC-SLP Acute Rehabilitation Services Secure Chat Preferred Office [phone] Lynann Beaver 01/30/2022, 1:48 PM                     IR PERCUTANEOUS ART THROMBECTOMY/INFUSION INTRACRANIAL INC DIAG ANGIO  Result Date:  01/30/2022 INDICATION: 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 EXAM: 1. EMERGENT LARGE VESSEL OCCLUSION THROMBOLYSIS (anterior CIRCULATION) COMPARISON:  None Available. MEDICATIONS: No antibiotic was administered within 1 hour of the procedure. ANESTHESIA/SEDATION: General anesthesia. CONTRAST:  Omnipaque 300 150 mL. FLUOROSCOPY TIME:  Fluoroscopy Time: 39 minutes 12 seconds (1289 mGy). COMPLICATIONS: None immediate. TECHNIQUE: Following a full explanation of the procedure along with the potential associated complications, an informed witnessed consent was obtained. The risks of intracranial hemorrhage of 10%, worsening neurological deficit, ventilator dependency, death and inability to revascularize were all reviewed in detail with the patient's spouse. The patient was then  put under general anesthesia by the Department of Anesthesiology at [facility]. The left groin was prepped and draped in the usual sterile fashion. Thereafter using modified Seldinger technique, transfemoral access into the left common femoral artery was obtained without difficulty. Over a 0.035 inch guidewire an 8 French 25 cm Pinnacle sheath was inserted. Through this, and also over a 0.035 inch guidewire a combination of a wrist Simmons 2 support catheter inside of an 087 balloon guide catheter was advanced to the aortic region, and selectively positioned initially in the left common carotid artery and then advanced to the distal cervical left ICA over an 035 inch guidewire with support catheter. The guidewire was removed. Good aspiration obtained hub of the balloon guide catheter. Arteriogram was then performed proximally from the common carotid artery then the distal left internal carotid artery. FINDINGS: The left common carotid arteriogram demonstrates the left external carotid artery and its major branches to be widely patent. The left internal carotid artery at bulb to the cranial skull base demonstrates wide  patency. Wide patency is noted of the petrous, the cavernous and the supraclinoid segments. Opacification of the left posterior cerebral artery via the left posterior communicating artery is noted. The left middle cerebral artery M1 segment is widely patent. The angiographic occlusion of the superior division mid M2 segment is noted. The left MCA inferior division opacifies into the capillary and venous phases. The left anterior cerebral artery opacifies into the capillary and venous phases. Transient cross-filling via the anterior communicating artery of the right anterior cerebral artery A2 segment and the A1 segment is evident. PROCEDURE: Over an Aristotle softip micro guidewire with a J configuration, a combination of a 132 cm Zoom aspiration catheter inside of which was a 160 cm Phenom microcatheter was advanced to the proximal left middle cerebral artery. Using a torque device, access was obtained with the micro guidewire through the occluded superior division into the proximal M3 segment followed by the microcatheter. The guidewire was removed. Good aspiration was obtained from the hub of the microcatheter. A gentle contrast injection demonstrated slow antegrade flow of contrast distally. A 3 mm x 36 mm retrieval device was then advanced to the distal end of the microcatheter and deployed in the usual manner. At this time the Zoom aspiration catheter was advanced into the proximal superior division. With proximal flow arrest in the left internal carotid artery constant aspiration was applied at the hub of the Zoom aspiration catheter and the aspiration device 2 minutes of the balloon guide catheter with a 20 mL syringe. Combination of the retrieval, the catheter and the Zoom aspiration catheter was retrieved removed. Following reversal of flow arrest, control arteriogram with the balloon guide catheter in the left internal carotid artery demonstrated near complete opacification of the superior division with  gradual improved flow into the M3 M4 segment of the superior division. Moderate spasm noted in the proximal superior division responded to 5 aliquotes of 25 mcg of nitroglycerin intra-arterially. Also noted was a small non flow limiting filling defect in a mid parietal branch of the inferior division in the distal M3 M4 region which also gradually started clearing over a period of 20 minutes. Control arteriogram performed through the balloon guide catheter in the mid cervical left demonstrated near complete revascularization of the left MCA achieving a TICI 2C revascularization. A control arteriogram performed through the left common carotid artery demonstrated small dissection of the distal left internal carotid artery at the skull base medially without any  flow limitation. The balloon guide catheter was removed. Diagnostic catheter arteriogram was then performed of the right common carotid artery and the right vertebral artery. These demonstrated the right external carotid artery and its major branches to be widely patent. Internal carotid artery at the bulb to the cranial skull base demonstrates wide patency. The petrous, the cavernous and the supraclinoid segments demonstrate wide patency as do the right middle cerebral artery and the right anterior cerebral artery into the capillary and venous phases. The right vertebral artery demonstrates wide patency. More distally, patency is maintained of the basilar junction in the right posterior-inferior cerebellar artery. The basilar artery, the posterior cerebral arteries, the superior cerebellar arteries and the anterior-inferior cerebellar arteries demonstrate patency into the capillary and venous phases. Unopacified blood was noted in the basilar artery from the contralateral left vertebral artery. An 8 French Angio-Seal closure device demonstrates hemostasis at the left groin puncture site. Distal pulses remained palpable in both feet unchanged. A CT of the brain  demonstrated no hemorrhagic complications. The patient was left intubated due to poor responsiveness following reversal of the anesthesia. Patient was then transferred to the neuro ICU post revascularization care. IMPRESSION: Status post endovascular revascularization of occluded division of the left middle cerebral artery achieving a TICI 2C revascularization of the left MCA distribution. Puncture to first pass approximately 36 mins. PLAN: Follow-up as per referring MD. Electronically Signed   By: Luanne Bras M.D.   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 EXAM: 1. EMERGENT LARGE VESSEL OCCLUSION THROMBOLYSIS (anterior CIRCULATION) COMPARISON:  None Available. MEDICATIONS: No antibiotic was administered within 1 hour of the procedure. ANESTHESIA/SEDATION: General anesthesia. CONTRAST:  Omnipaque 300 150 mL. FLUOROSCOPY TIME:  Fluoroscopy Time: 39 minutes 12 seconds (1289 mGy). COMPLICATIONS: None immediate. TECHNIQUE: Following a full explanation of the procedure along with the potential associated complications, an informed witnessed consent was obtained. The risks of intracranial hemorrhage of 10%, worsening neurological deficit, ventilator dependency, death and inability to revascularize were all reviewed in detail with the patient's spouse. The patient was then put under general anesthesia by the Department of Anesthesiology at [facility]. The left groin was prepped and draped in the usual sterile fashion. Thereafter using modified Seldinger technique, transfemoral access into the left common femoral artery was obtained without difficulty. Over a 0.035 inch guidewire an 8 French 25 cm Pinnacle sheath was inserted. Through this, and also over a 0.035 inch guidewire a combination of a wrist Simmons 2 support catheter inside of an 087 balloon guide catheter was advanced to the aortic region, and selectively  positioned initially in the left common carotid artery and then advanced to the distal cervical left ICA over an 035 inch guidewire with support catheter. The guidewire was removed. Good aspiration obtained hub of the balloon guide catheter. Arteriogram was then performed proximally from the common carotid artery then the distal left internal carotid artery. FINDINGS: The left common carotid arteriogram demonstrates the left external carotid artery and its major branches to be widely patent. The left internal carotid artery at bulb to the cranial skull base demonstrates wide patency. Wide patency is noted of the petrous, the cavernous and the supraclinoid segments. Opacification of the left posterior cerebral artery via the left posterior communicating artery is noted. The left middle cerebral artery M1 segment is widely patent. The angiographic occlusion of the superior division mid M2 segment is noted. The left  MCA inferior division opacifies into the capillary and venous phases. The left anterior cerebral artery opacifies into the capillary and venous phases. Transient cross-filling via the anterior communicating artery of the right anterior cerebral artery A2 segment and the A1 segment is evident. PROCEDURE: Over an Aristotle softip micro guidewire with a J configuration, a combination of a 132 cm Zoom aspiration catheter inside of which was a 160 cm Phenom microcatheter was advanced to the proximal left middle cerebral artery. Using a torque device, access was obtained with the micro guidewire through the occluded superior division into the proximal M3 segment followed by the microcatheter. The guidewire was removed. Good aspiration was obtained from the hub of the microcatheter. A gentle contrast injection demonstrated slow antegrade flow of contrast distally. A 3 mm x 36 mm retrieval device was then advanced to the distal end of the microcatheter and deployed in the usual manner. At this time the Zoom  aspiration catheter was advanced into the proximal superior division. With proximal flow arrest in the left internal carotid artery constant aspiration was applied at the hub of the Zoom aspiration catheter and the aspiration device 2 minutes of the balloon guide catheter with a 20 mL syringe. Combination of the retrieval, the catheter and the Zoom aspiration catheter was retrieved removed. Following reversal of flow arrest, control arteriogram with the balloon guide catheter in the left internal carotid artery demonstrated near complete opacification of the superior division with gradual improved flow into the M3 M4 segment of the superior division. Moderate spasm noted in the proximal superior division responded to 5 aliquotes of 25 mcg of nitroglycerin intra-arterially. Also noted was a small non flow limiting filling defect in a mid parietal branch of the inferior division in the distal M3 M4 region which also gradually started clearing over a period of 20 minutes. Control arteriogram performed through the balloon guide catheter in the mid cervical left demonstrated near complete revascularization of the left MCA achieving a TICI 2C revascularization. A control arteriogram performed through the left common carotid artery demonstrated small dissection of the distal left internal carotid artery at the skull base medially without any flow limitation. The balloon guide catheter was removed. Diagnostic catheter arteriogram was then performed of the right common carotid artery and the right vertebral artery. These demonstrated the right external carotid artery and its major branches to be widely patent. Internal carotid artery at the bulb to the cranial skull base demonstrates wide patency. The petrous, the cavernous and the supraclinoid segments demonstrate wide patency as do the right middle cerebral artery and the right anterior cerebral artery into the capillary and venous phases. The right vertebral artery  demonstrates wide patency. More distally, patency is maintained of the basilar junction in the right posterior-inferior cerebellar artery. The basilar artery, the posterior cerebral arteries, the superior cerebellar arteries and the anterior-inferior cerebellar arteries demonstrate patency into the capillary and venous phases. Unopacified blood was noted in the basilar artery from the contralateral left vertebral artery. An 8 French Angio-Seal closure device demonstrates hemostasis at the left groin puncture site. Distal pulses remained palpable in both feet unchanged. A CT of the brain demonstrated no hemorrhagic complications. The patient was left intubated due to poor responsiveness following reversal of the anesthesia. Patient was then transferred to the neuro ICU post revascularization care. IMPRESSION: Status post endovascular revascularization of occluded division of the left middle cerebral artery achieving a TICI 2C revascularization of the left MCA distribution. Puncture to first pass approximately 36 mins.  PLAN: Follow-up as per referring MD. Electronically Signed   By: Luanne Bras M.D.   On: 01/30/2022 08:42   MR BRAIN WO CONTRAST  Result Date: 01/28/2022 CLINICAL DATA:  Right MCA stroke status post thrombectomy, follow-up EXAM: MRI HEAD WITHOUT CONTRAST MRA HEAD WITHOUT CONTRAST TECHNIQUE: Multiplanar, multi-echo pulse sequences of the brain and surrounding structures were acquired without intravenous contrast. Angiographic images of the Circle of Willis were acquired using MRA technique without intravenous contrast. COMPARISON:  01/22/2022 MRI head, no prior MRA, correlation is made with CTA head and neck 01/21/2022 FINDINGS: Brain: New foci of restricted diffusion with ADC correlate in the left posterior frontal and anterior parietal cortex (series 5, images 80 8-95). Redemonstrated focus of restricted diffusion with ADC correlate in the left insula (series 5, image 76 and series 6, image  26). Other previously noted foci of restricted diffusion are decreased in conspicuity and do not have persistent ADC correlates. No acute hemorrhage, mass, mass effect, or midline shift. No hydrocephalus or extra-axial collection. Punctate focus of hemosiderin deposition in the left posterior frontal lobe. Minimal T2 hyperintense signal in the periventricular Lamson matter and pons, likely the sequela of mild chronic small vessel ischemic disease. Vascular: Normal arterial flow voids. Skull and upper cervical spine: Normal marrow signal. Sinuses/Orbits: Minimal mucosal thickening in the ethmoid air cells. The orbits are unremarkable. Other: The mastoids are well aerated. MRA HEAD FINDINGS Anterior circulation: Both internal carotid arteries are patent to the termini, without significant stenosis. A1 segments patent. Normal anterior communicating artery. Anterior cerebral arteries are patent to their distal aspects. No M1 stenosis or occlusion. Distal MCA branches perfused and symmetric.In particular, the left MCA branches appear perfused and without focal stenosis. Posterior circulation: Vertebral arteries patent to the vertebrobasilar junction without stenosis. Basilar patent to its distal aspect. Superior cerebellar arteries patent bilaterally. Patent P1 segments. PCAs perfused to their distal aspects without stenosis. Diminutive left posterior communicating artery. The right posterior communicating artery is not definitively visualized. Anatomic variants: None significant IMPRESSION: 1. 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 2. No intracranial large vessel occlusion or significant stenosis. In particular, left MCA branches appear perfused and without focal stenosis. These results will be called to the ordering clinician or representative by the Radiologist Assistant, and communication documented in the PACS or [employer]. Electronically Signed   By: Merilyn Baba M.D.    On: 01/28/2022 23:40   MR ANGIO HEAD WO CONTRAST  Result Date: 01/28/2022 CLINICAL DATA:  Right MCA stroke status post thrombectomy, follow-up EXAM: MRI HEAD WITHOUT CONTRAST MRA HEAD WITHOUT CONTRAST TECHNIQUE: Multiplanar, multi-echo pulse sequences of the brain and surrounding structures were acquired without intravenous contrast. Angiographic images of the Circle of Willis were acquired using MRA technique without intravenous contrast. COMPARISON:  01/22/2022 MRI head, no prior MRA, correlation is made with CTA head and neck 01/21/2022 FINDINGS: Brain: New foci of restricted diffusion with ADC correlate in the left posterior frontal and anterior parietal cortex (series 5, images 80 8-95). Redemonstrated focus of restricted diffusion with ADC correlate in the left insula (series 5, image 76 and series 6, image 26). Other previously noted foci of restricted diffusion are decreased in conspicuity and do not have persistent ADC correlates. No acute hemorrhage, mass, mass effect, or midline shift. No hydrocephalus or extra-axial collection. Punctate focus of hemosiderin deposition in the left posterior frontal lobe. Minimal T2 hyperintense signal in the periventricular [place] matter and pons, likely the sequela of mild  chronic small vessel ischemic disease. Vascular: Normal arterial flow voids. Skull and upper cervical spine: Normal marrow signal. Sinuses/Orbits: Minimal mucosal thickening in the ethmoid air cells. The orbits are unremarkable. Other: The mastoids are well aerated. MRA HEAD FINDINGS Anterior circulation: Both internal carotid arteries are patent to the termini, without significant stenosis. A1 segments patent. Normal anterior communicating artery. Anterior cerebral arteries are patent to their distal aspects. No M1 stenosis or occlusion. Distal MCA branches perfused and symmetric.In particular, the left MCA branches appear perfused and without focal stenosis. Posterior circulation: Vertebral  arteries patent to the vertebrobasilar junction without stenosis. Basilar patent to its distal aspect. Superior cerebellar arteries patent bilaterally. Patent P1 segments. PCAs perfused to their distal aspects without stenosis. Diminutive left posterior communicating artery. The right posterior communicating artery is not definitively visualized. Anatomic variants: None significant IMPRESSION: 1. 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 2. No intracranial large vessel occlusion or significant stenosis. In particular, left MCA branches appear perfused and without focal stenosis. These results will be called to the ordering clinician or representative by the Radiologist Assistant, and communication documented in the PACS or [employer]. Electronically Signed   By: Merilyn Baba M.D.   On: 01/28/2022 23:40   [place] EKG SITE RITE  Result Date: 01/28/2022 If Site Rite image not attached, placement could not be confirmed due to current cardiac rhythm.  DG Abd 1 View  Result Date: 01/28/2022 CLINICAL DATA:  Evaluate OG tube placement EXAM: ABDOMEN - 1 VIEW COMPARISON:  None Available. FINDINGS: The OG tube terminates in the left upper quadrant, in the region of the stomach. IMPRESSION: The OG tube appears to terminate in the stomach. No other abnormalities. Electronically Signed   By: Dorise Bullion III M.D.   On: 01/28/2022 17:28   Portable Chest x-ray  Result Date: 01/28/2022 CLINICAL DATA:  [id number]. Encounter for ETT placement. EXAM: PORTABLE CHEST 1 VIEW COMPARISON:  Chest x-ray 01/26/2022, CT heart 01/23/2022 FINDINGS: Enteric tube with tip terminating 3 cm above the carina. Slightly more prominent aortic arch and aortic knob likely due to positioning and AP portable technique. Otherwise the heart and mediastinal contours are unchanged normal limits. No focal consolidation. No pulmonary edema. No pleural effusion. No pneumothorax. No acute osseous abnormality.  IMPRESSION: 1. Slightly more prominent aortic arch and aortic knob likely due to positioning and AP portable technique. Finding can likely be further evaluated on CT angio head and neck 01/28/2022. 2. No active disease. Electronically Signed   By: Iven Finn M.D.   On: 01/28/2022 17:21   CT HEAD CODE STROKE WO CONTRAST  Result Date: 01/28/2022 CLINICAL DATA:  Code stroke.  Neuro deficit, acute, stroke suspected EXAM: CT HEAD WITHOUT CONTRAST TECHNIQUE: Contiguous axial images were obtained from the base of the skull through the vertex without intravenous contrast. RADIATION DOSE REDUCTION: This exam was performed according to the departmental dose-optimization program which includes automated exposure control, adjustment of the mA and/or kV according to patient size and/or use of iterative reconstruction technique. COMPARISON:  CT head 01/21/2022. FINDINGS: Brain: No evidence of acute large vascular territory infarction, hemorrhage, hydrocephalus, extra-axial collection or mass lesion/mass effect. Vascular: No hyperdense vessel identified. Skull: No acute fracture. Sinuses/Orbits: Clear sinuses.  No acute orbital findings. Other: No mastoid effusions. ASPECTS [facility] Stroke Program Early CT Score) total score (0-10 with 10 being normal): 10. IMPRESSION: 1. No evidence of acute intracranial abnormality. 2. ASPECTS is 10. Code stroke imaging results were communicated  on 01/28/2022 at 1:43 pm to provider Bhagat via secure text paging. Electronically Signed   By: Margaretha Sheffield M.D.   On: 01/28/2022 13:43   DG Chest 2 View  Result Date: 01/26/2022 CLINICAL DATA:  Chest and shoulder pain.  Recent MRI and CVA. EXAM: CHEST - 2 VIEW COMPARISON:  11/21/2021 FINDINGS: The lungs are clear without focal pneumonia, edema, pneumothorax or pleural effusion. The cardiopericardial silhouette is within normal limits for size. The visualized bony structures of the thorax are unremarkable. IMPRESSION: No active  cardiopulmonary disease. Electronically Signed   By: Misty Stanley M.D.   On: 01/26/2022 12:03   CT CORONARY MORPH W/CTA COR W/SCORE W/CA W/CM &/OR WO/CM  Addendum Date: 01/23/2022   ADDENDUM REPORT: 01/23/2022 13:25 EXAM: OVER-READ INTERPRETATION  CT CHEST The following report is a limited chest CT over-read performed by radiologist Dr. Lindaann Slough [facility] on 01/23/2022. This over-read does not include interpretation of cardiac or coronary anatomy or pathology. The coronary CTA interpretation by the cardiologist is attached. COMPARISON:  Radiographs 11/21/2021. Coronary artery calcium score CT 10/04/2020 FINDINGS: Mediastinum/Nodes: No enlarged lymph nodes within the visualized mediastinum. Lungs/Pleura: There is no pleural effusion. The visualized lungs appear clear. Upper abdomen: No significant findings within the visualized upper abdomen. Musculoskeletal/Chest wall: No chest wall mass or suspicious osseous findings within the visualized chest. IMPRESSION: No significant extracardiac findings within the visualized chest. Electronically Signed   By: Richardean Sale M.D.   On: 01/23/2022 13:25   Result Date: 01/23/2022 HISTORY: Chest pain/anginal equiv, ECGs or troponins abnormal EXAM: Cardiac/Coronary CT TECHNIQUE: The patient was scanned on a [employer]. PROTOCOL: A 100 kV prospective scan was triggered in the descending thoracic aorta at 111 HU's. Axial non-contrast 3 mm slices were carried out through the heart. The data set was analyzed on a dedicated work station and scored using the Agatston method. Gantry rotation speed was 250 msecs and collimation was .6 mm. Heart rate was optimized medically and sl NTG was given. The 3D data set was reconstructed in 5% intervals of the 35-75 % of the R-R cycle. Systolic and diastolic phases were analyzed on a dedicated work station using MPR, MIP and VRT modes. The patient received contrast. FINDINGS: Coronary calcium score: The  patient's coronary artery calcium score is 0, which places the patient in the 0 percentile. Coronary arteries: Normal coronary origins.  Right dominance. Right Coronary Artery: Normal caliber vessel, gives rise to small PDA. No significant plaque or stenosis. Left Main Coronary Artery: Normal caliber vessel. No significant plaque or stenosis. Small caliber ramus without significant plaque or stenosis. Left Anterior Descending Coronary Artery: Normal caliber vessel. Minimal noncalcified plaque with 1-24% stenosis. Gives rise to large first diagonal branch without significant stenosis. Distal LAD wraps apex Left Circumflex Artery: Normal caliber vessel. Very trivial noncalcified plaque without significant stenosis. Gives rise to large first and second, small third OM branches. Aorta: Normal size, 31 mm at the mid ascending aorta (level of the PA bifurcation) measured double oblique. Trivial aortic atherosclerosis. No dissection seen in visualized portions of the aorta. Aortic Valve: No calcifications. Trileaflet. Other findings: Normal pulmonary vein drainage into the left atrium. Normal left atrial appendage without a thrombus. Appendage is chicken wing type. Normal size of the pulmonary artery. Normal appearance of the pericardium. There is an apical LV thrombus, dimensions saves on PACS images. Maximum dimension 15.6 x 11.3 mm There is a small accessory left atrial segment on the cranial aspect of the left  atrium. IMPRESSION: 1.  Very minimal nonobstructive CAD, CADRADS = 1. 2. Coronary calcium score of 0. This was 0 percentile for age and sex matched control. 3. Normal coronary origin with right dominance. 4. There is an apical LV thrombus, dimensions saves on PACS images. Maximum dimension 15.6 x 11.3 mm Findings reported to ordering provider. INTERPRETATION: CAD-RADS 1: Minimal non-obstructive CAD (0-24%). Consider non-atherosclerotic causes of chest pain. Consider preventive therapy and risk factor  modification. Electronically Signed: By: Buford Dresser M.D. 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 She initially presented on 01/21/2022 with transient episode of aphasia followed sudden onset dizziness, nausea, vomiting, chest pain that radiated to upper back on Saturday. MRI brain showed small ischemic infarct of the frontal [place] matter on the left. Her troponins were elevated to over 300 and her echocardiogram showed apical hypokinesis and a left ventricular thrombus consistent with NSTEMI. Around 6 p.m. on 01/21/2022, patient developed worsening aphasia, NIHSS 6. Head CT was unremarkable. CT angiogram of the head and neck showed a left M2/M3-MCA occlusion. She was then transferred to our service for an emergency mechanical thrombectomy. EXAM: ULTRASOUND-GUIDED VASCULAR ACCESS DIAGNOSTIC CEREBRAL ANGIOGRAM MECHANICAL THROMBECTOMY FLAT PANEL HEAD CT COMPARISON:  CT/CT angiogram of the head and neck January 22, 2019 MEDICATIONS: No antibiotic was administered. ANESTHESIA/SEDATION: The procedure was performed under general anesthesia. CONTRAST:  75 mL of Omnipaque 300 milligram/mL. FLUOROSCOPY: Radiation Exposure Index (as provided by the fluoroscopic device): 644.0 mGy Kerma COMPLICATIONS: None immediate. TECHNIQUE: Informed written consent was obtained from the patient after a thorough discussion of the procedural risks, benefits and alternatives. All questions were addressed. Maximal Sterile Barrier Technique was utilized including caps, mask, sterile gowns, sterile gloves, sterile drape, hand hygiene and skin antiseptic. A timeout  was performed prior to the initiation of the procedure. The right groin was prepped and draped in the usual sterile fashion. Using a micropuncture kit and the modified Seldinger technique, access was gained to the right common femoral artery and an 8 French sheath was placed. Real-time ultrasound guidance was utilized for vascular access including the acquisition of a permanent ultrasound image documenting patency of the accessed vessel. Under fluoroscopy, a Zoom 88 guide catheter was navigated over a 6 [place] VTK catheter and a 0.035" Terumo Glidewire into the aortic arch. The catheter was placed into the left common carotid artery and then advanced into the left internal carotid artery. The diagnostic catheter was removed. Frontal and lateral angiograms of the head were obtained. FINDINGS: 1. Normal caliber of the right common femoral artery, adequate for vascular access. 2. There is no occlusion of the distal left M2/MCA superior division branch extending to the M3 segment. PROCEDURE: Using biplane roadmap, a Vect 46 aspiration catheter was navigated over an Aristotle 24 microguidewire into the cavernous segment of the left ICA. The aspiration catheter was then advanced over the wire to the level of occlusion in the M2-M3 segment and connected to an aspiration pump. Continuous aspiration was performed for 2 minutes. The guide catheter was connected to a VacLok syringe. The aspiration catheter was subsequently removed under constant aspiration. The guide catheter was aspirated for debris. Left internal carotid artery angiograms with magnified frontal and lateral views of the head showed recanalization of the left  MCA with slow flow in a few distal cortical branches (TICI 2C). No embolus to new territory. Flat panel CT of the head was obtained and post processed in a separate workstation with concurrent attending physician supervision. Selected images were sent to PACS. No evidence of hemorrhagic complication.  Delayed left internal carotid artery angiogram showed persistent patency of the left MCA. Right common femoral artery angiogram was obtained in right anterior oblique view. The puncture is at the level of the common femoral artery. The artery has normal caliber, adequate for closure device. The sheath was exchanged over the wire for a Perclose prostyle which was utilized for access closure. Immediate hemostasis was achieved. IMPRESSION: 1. Successful mechanical thrombectomy for treatment of a distal left M2/MCA occlusion with direct contact aspiration achieving complete recanalization (TICI 2C). 2. No thromboembolic or hemorrhagic complication. PLAN: Transfer to ICU for continued post stroke care. Electronically Signed   By: Pedro Earls M.D.   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 She initially presented on 01/21/2022 with transient episode of aphasia followed sudden onset dizziness, nausea, vomiting, chest pain that radiated to upper back on Saturday. MRI brain showed small ischemic infarct of the frontal [place] matter on the left. Her troponins were elevated to over 300 and her echocardiogram showed apical hypokinesis and a left ventricular thrombus consistent with NSTEMI. Around 6 p.m. on 01/21/2022, patient developed worsening aphasia, NIHSS 6. Head CT was unremarkable. CT angiogram of the head and neck showed a left M2/M3-MCA occlusion. She was then transferred to our service for an emergency mechanical thrombectomy. EXAM: ULTRASOUND-GUIDED VASCULAR ACCESS DIAGNOSTIC CEREBRAL ANGIOGRAM MECHANICAL THROMBECTOMY FLAT PANEL HEAD CT COMPARISON:  CT/CT angiogram of the head and neck  January 22, 2019 MEDICATIONS: No antibiotic was administered. ANESTHESIA/SEDATION: The procedure was performed under general anesthesia. CONTRAST:  75 mL of Omnipaque 300 milligram/mL. FLUOROSCOPY: Radiation Exposure Index (as provided by the fluoroscopic device): 607.3 mGy Kerma COMPLICATIONS: None immediate. TECHNIQUE: Informed written consent was obtained from the patient after a thorough discussion of the procedural risks, benefits and alternatives. All questions were addressed. Maximal Sterile Barrier Technique was utilized including caps, mask, sterile gowns, sterile gloves, sterile drape, hand hygiene and skin antiseptic. A timeout was performed prior to the initiation of the procedure. The right groin was prepped and draped in the usual sterile fashion. Using a micropuncture kit and the modified Seldinger technique, access was gained to the right common femoral artery and an 8 French sheath was placed. Real-time ultrasound guidance was utilized for vascular access including the acquisition of a permanent ultrasound image documenting patency of the accessed vessel. Under fluoroscopy, a Zoom 88 guide catheter was navigated over a 6 [place] VTK catheter and a 0.035" Terumo Glidewire into the aortic arch. The catheter was placed into the left common carotid artery and then advanced into the left internal carotid artery. The diagnostic catheter was removed. Frontal and lateral angiograms of the head were obtained. FINDINGS: 1. Normal caliber of the right common femoral artery, adequate for vascular access. 2. There is no occlusion of the distal left M2/MCA superior division branch extending to the M3 segment. PROCEDURE: Using biplane roadmap, a Vect 46 aspiration catheter was navigated over an Aristotle 24 microguidewire into the cavernous segment of the left  ICA. The aspiration catheter was then advanced over the wire to the level of occlusion in the M2-M3 segment and connected to an aspiration pump. Continuous  aspiration was performed for 2 minutes. The guide catheter was connected to a VacLok syringe. The aspiration catheter was subsequently removed under constant aspiration. The guide catheter was aspirated for debris. Left internal carotid artery angiograms with magnified frontal and lateral views of the head showed recanalization of the left MCA with slow flow in a few distal cortical branches (TICI 2C). No embolus to new territory. Flat panel CT of the head was obtained and post processed in a separate workstation with concurrent attending physician supervision. Selected images were sent to PACS. No evidence of hemorrhagic complication. Delayed left internal carotid artery angiogram showed persistent patency of the left MCA. Right common femoral artery angiogram was obtained in right anterior oblique view. The puncture is at the level of the common femoral artery. The artery has normal caliber, adequate for closure device. The sheath was exchanged over the wire for a Perclose prostyle which was utilized for access closure. Immediate hemostasis was achieved. IMPRESSION: 1. Successful mechanical thrombectomy for treatment of a distal left M2/MCA occlusion with direct contact aspiration achieving complete recanalization (TICI 2C). 2. No thromboembolic or hemorrhagic complication. PLAN: Transfer to ICU for continued post stroke care. Electronically Signed   By: Pedro Earls M.D.   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 She initially presented on 01/21/2022 with transient episode of aphasia followed sudden onset dizziness, nausea, vomiting, chest pain that radiated to upper  back on Saturday. MRI brain showed small ischemic infarct of the frontal [place] matter on the left. Her troponins were elevated to over 300 and her echocardiogram showed apical hypokinesis and a left ventricular thrombus consistent with NSTEMI. Around 6 p.m. on 01/21/2022, patient developed worsening aphasia, NIHSS 6. Head CT was unremarkable. CT angiogram of the head and neck showed a left M2/M3-MCA occlusion. She was then transferred to our service for an emergency mechanical thrombectomy. EXAM: ULTRASOUND-GUIDED VASCULAR ACCESS DIAGNOSTIC CEREBRAL ANGIOGRAM MECHANICAL THROMBECTOMY FLAT PANEL HEAD CT COMPARISON:  CT/CT angiogram of the head and neck January 22, 2019 MEDICATIONS: No antibiotic was administered. ANESTHESIA/SEDATION: The procedure was performed under general anesthesia. CONTRAST:  75 mL of Omnipaque 300 milligram/mL. FLUOROSCOPY: Radiation Exposure Index (as provided by the fluoroscopic device): 161.0 mGy Kerma COMPLICATIONS: None immediate. TECHNIQUE: Informed written consent was obtained from the patient after a thorough discussion of the procedural risks, benefits and alternatives. All questions were addressed. Maximal Sterile Barrier Technique was utilized including caps, mask, sterile gowns, sterile gloves, sterile drape, hand hygiene and skin antiseptic. A timeout was performed prior to the initiation of the procedure. The right groin was prepped and draped in the usual sterile fashion. Using a micropuncture kit and the modified Seldinger technique, access was gained to the right common femoral artery and an 8 French sheath was placed. Real-time ultrasound guidance was utilized for vascular access including the acquisition of a permanent ultrasound image documenting patency of the accessed vessel. Under fluoroscopy, a Zoom 88 guide catheter was navigated over a 6 [place] VTK catheter and a 0.035" Terumo Glidewire into the aortic arch. The catheter was placed into the left common carotid artery  and  then advanced into the left internal carotid artery. The diagnostic catheter was removed. Frontal and lateral angiograms of the head were obtained. FINDINGS: 1. Normal caliber of the right common femoral artery, adequate for vascular access. 2. There is no occlusion of the distal left M2/MCA superior division branch extending to the M3 segment. PROCEDURE: Using biplane roadmap, a Vect 46 aspiration catheter was navigated over an Aristotle 24 microguidewire into the cavernous segment of the left ICA. The aspiration catheter was then advanced over the wire to the level of occlusion in the M2-M3 segment and connected to an aspiration pump. Continuous aspiration was performed for 2 minutes. The guide catheter was connected to a VacLok syringe. The aspiration catheter was subsequently removed under constant aspiration. The guide catheter was aspirated for debris. Left internal carotid artery angiograms with magnified frontal and lateral views of the head showed recanalization of the left MCA with slow flow in a few distal cortical branches (TICI 2C). No embolus to new territory. Flat panel CT of the head was obtained and post processed in a separate workstation with concurrent attending physician supervision. Selected images were sent to PACS. No evidence of hemorrhagic complication. Delayed left internal carotid artery angiogram showed persistent patency of the left MCA. Right common femoral artery angiogram was obtained in right anterior oblique view. The puncture is at the level of the common femoral artery. The artery has normal caliber, adequate for closure device. The sheath was exchanged over the wire for a Perclose prostyle which was utilized for access closure. Immediate hemostasis was achieved. IMPRESSION: 1. Successful mechanical thrombectomy for treatment of a distal left M2/MCA occlusion with direct contact aspiration achieving complete recanalization (TICI 2C). 2. No thromboembolic or hemorrhagic  complication. PLAN: Transfer to ICU for continued post stroke care. Electronically Signed   By: Pedro Earls M.D.   On: 01/22/2022 14:25   MR BRAIN WO CONTRAST  Result Date: 01/22/2022 CLINICAL DATA:  Follow-up stroke EXAM: MRI HEAD WITHOUT CONTRAST TECHNIQUE: Multiplanar, multiecho pulse sequences of the brain and surrounding structures were obtained without intravenous contrast. COMPARISON:  Brain MRI and CT/CTA head and neck 1 day prior FINDINGS: Brain: A punctate fusion of diffusion restriction in the left frontal lobe periventricular [place] matter is unchanged compared to the study from 1 day prior. Additional small foci of diffusion restriction in the left external capsule, insula, and frontal lobe cortex are new. There is no associated hemorrhage or mass effect. Parenchymal volume is normal. The ventricles are normal in size. Parenchymal signal is otherwise normal, with no significant burden of under vitamin chronic small vessel ischemic change. There is no mass lesion.  There is no mass effect or midline shift. Vascular: Normal flow voids. Skull and upper cervical spine: Normal marrow signal. Sinuses/Orbits: The paranasal sinuses are clear. The globes and orbits are unremarkable. Other: None. IMPRESSION: New punctate acute infarcts in the left external capsule, insula, and frontal lobe cortex, and unchanged additional punctate infarct in the left frontal lobe Buch[place] matter. Electronically Signed   By: Valetta Mole M.D.   On: 01/22/2022 11:39   CT CEREBRAL PERFUSION W CONTRAST  Result Date: 01/21/2022 CLINICAL DATA:  Neuro deficit, acute, stroke suspected. EXAM: CT ANGIOGRAPHY HEAD AND NECK CT PERFUSION BRAIN TECHNIQUE: Multidetector CT imaging of the head and neck was performed using the standard protocol during bolus administration of intravenous contrast. Multiplanar CT image reconstructions and MIPs were obtained to evaluate the vascular anatomy. Carotid stenosis measurements (when  applicable) are  obtained utilizing NASCET criteria, using the distal internal carotid diameter as the denominator. Multiphase CT imaging of the brain was performed following IV bolus contrast injection. Subsequent parametric perfusion maps were calculated using RAPID software. RADIATION DOSE REDUCTION: This exam was performed according to the departmental dose-optimization program which includes automated exposure control, adjustment of the mA and/or kV according to patient size and/or use of iterative reconstruction technique. CONTRAST:  179mL OMNIPAQUE IOHEXOL 350 MG/ML SOLN COMPARISON:  Brain MRI 01/21/2022. Noncontrast head CT performed earlier today 01/21/2022. FINDINGS: CTA NECK FINDINGS Aortic arch: Standard aortic branching. Mild atherosclerotic plaque within the visualized aortic arch and proximal major branch vessels of the neck. Streak and beam hardening artifact arising from a dense right-sided contrast bolus partially obscures the right subclavian artery. Within this limitation, there is no appreciable hemodynamically significant innominate or proximal subclavian artery stenosis. Right carotid system: CCA and ICA patent within the neck without stenosis. Mild atherosclerotic plaque about the carotid bifurcation. Left carotid system: CCA and ICA patent within the neck without stenosis. Minimal atherosclerotic plaque about the carotid bifurcation. Vertebral arteries: Vertebral arteries codominant and patent within the neck without stenosis. Nonstenotic atherosclerotic plaque at the origin of the right vertebral artery. Skeleton: Cervical spondylosis. No acute fracture or aggressive osseous lesion. Other neck: No neck mass or cervical lymphadenopathy. Upper chest: No consolidation within the imaged lung apices. Review of the MIP images confirms the above findings CTA HEAD FINDINGS Anterior circulation: The intracranial internal carotid arteries are patent. The M1 middle cerebral arteries are patent.  Occluded mid M2 left MCA vessel (with distal reconstitution, likely due to collateral flow) (for instance as seen on series 15, image 15). The anterior cerebral arteries are patent. No intracranial aneurysm is identified. Posterior circulation: The intracranial vertebral arteries are patent. The basilar artery is patent. The posterior cerebral arteries are patent. Posterior communicating arteries are diminutive or absent, bilaterally. Venous sinuses: Within the limitations of contrast timing, no convincing thrombus. Anatomic variants: As described. Review of the MIP images confirms the above findings CT Brain Perfusion Findings: CBF (<30%) Volume: 9mL Perfusion (Tmax>6.0s) volume: 55mL Mismatch Volume: 51mL Infarction Location:None identified CTA head impression and CT perfusion head impression called by telephone at the time of interpretation on 01/21/2022 at 6:50 pm to provider [facility] , who verbally acknowledged these results. IMPRESSION: CTA neck: 1. The common carotid, internal carotid and vertebral arteries are patent within the neck without stenosis. Minimal non-stenotic atherosclerotic plaque about the carotid bifurcations and at the origin of the right vertebral artery. 2.  Aortic Atherosclerosis (ICD10-I70.0). CTA head: Occluded mid M2 left MCA vessel (with distal reconstitution, likely due to collateral flow). CT perfusion head: The perfusion software identifies a 7 mL region of critically hypoperfused parenchyma within the left insula/frontal lobe (utilizing the Tmax>6 seconds threshold). No core infarct is identified. Reported mismatch volume: 7 mL. Electronically Signed   By: Kellie Simmering D.O.   On: 01/21/2022 19:09   CT ANGIO HEAD NECK W WO CM (CODE STROKE)  Result Date: 01/21/2022 CLINICAL DATA:  Neuro deficit, acute, stroke suspected. EXAM: CT ANGIOGRAPHY HEAD AND NECK CT PERFUSION BRAIN TECHNIQUE: Multidetector CT imaging of the head and neck was performed using the standard protocol  during bolus administration of intravenous contrast. Multiplanar CT image reconstructions and MIPs were obtained to evaluate the vascular anatomy. Carotid stenosis measurements (when applicable) are obtained utilizing NASCET criteria, using the distal internal carotid diameter as the denominator. Multiphase CT imaging of the brain was performed following IV  bolus contrast injection. Subsequent parametric perfusion maps were calculated using RAPID software. RADIATION DOSE REDUCTION: This exam was performed according to the departmental dose-optimization program which includes automated exposure control, adjustment of the mA and/or kV according to patient size and/or use of iterative reconstruction technique. CONTRAST:  145mL OMNIPAQUE IOHEXOL 350 MG/ML SOLN COMPARISON:  Brain MRI 01/21/2022. Noncontrast head CT performed earlier today 01/21/2022. FINDINGS: CTA NECK FINDINGS Aortic arch: Standard aortic branching. Mild atherosclerotic plaque within the visualized aortic arch and proximal major branch vessels of the neck. Streak and beam hardening artifact arising from a dense right-sided contrast bolus partially obscures the right subclavian artery. Within this limitation, there is no appreciable hemodynamically significant innominate or proximal subclavian artery stenosis. Right carotid system: CCA and ICA patent within the neck without stenosis. Mild atherosclerotic plaque about the carotid bifurcation. Left carotid system: CCA and ICA patent within the neck without stenosis. Minimal atherosclerotic plaque about the carotid bifurcation. Vertebral arteries: Vertebral arteries codominant and patent within the neck without stenosis. Nonstenotic atherosclerotic plaque at the origin of the right vertebral artery. Skeleton: Cervical spondylosis. No acute fracture or aggressive osseous lesion. Other neck: No neck mass or cervical lymphadenopathy. Upper chest: No consolidation within the imaged lung apices. Review of the  MIP images confirms the above findings CTA HEAD FINDINGS Anterior circulation: The intracranial internal carotid arteries are patent. The M1 middle cerebral arteries are patent. Occluded mid M2 left MCA vessel (with distal reconstitution, likely due to collateral flow) (for instance as seen on series 15, image 15). The anterior cerebral arteries are patent. No intracranial aneurysm is identified. Posterior circulation: The intracranial vertebral arteries are patent. The basilar artery is patent. The posterior cerebral arteries are patent. Posterior communicating arteries are diminutive or absent, bilaterally. Venous sinuses: Within the limitations of contrast timing, no convincing thrombus. Anatomic variants: As described. Review of the MIP images confirms the above findings CT Brain Perfusion Findings: CBF (<30%) Volume: 61mL Perfusion (Tmax>6.0s) volume: 18mL Mismatch Volume: 65mL Infarction Location:None identified CTA head impression and CT perfusion head impression called by telephone at the time of interpretation on 01/21/2022 at 6:50 pm to provider MCNEILL [facility] , who verbally acknowledged these results. IMPRESSION: CTA neck: 1. The common carotid, internal carotid and vertebral arteries are patent within the neck without stenosis. Minimal non-stenotic atherosclerotic plaque about the carotid bifurcations and at the origin of the right vertebral artery. 2.  Aortic Atherosclerosis (ICD10-I70.0). CTA head: Occluded mid M2 left MCA vessel (with distal reconstitution, likely due to collateral flow). CT perfusion head: The perfusion software identifies a 7 mL region of critically hypoperfused parenchyma within the left insula/frontal lobe (utilizing the Tmax>6 seconds threshold). No core infarct is identified. Reported mismatch volume: 7 mL. Electronically Signed   By: Kellie Simmering D.O.   On: 01/21/2022 19:09   CT HEAD CODE STROKE WO CONTRAST  Result Date: 01/21/2022 CLINICAL DATA:  Code stroke. Neuro  deficit, acute, stroke suspected. EXAM: CT HEAD WITHOUT CONTRAST TECHNIQUE: Contiguous axial images were obtained from the base of the skull through the vertex without intravenous contrast. RADIATION DOSE REDUCTION: This exam was performed according to the departmental dose-optimization program which includes automated exposure control, adjustment of the mA and/or kV according to patient size and/or use of iterative reconstruction technique. COMPARISON:  Same-day brain MRI 01/21/2022. FINDINGS: Brain: No age advanced or lobar predominant parenchymal atrophy. A subcentimeter acute infarct within the left frontal lobe [place] matter (adjacent to the left lateral ventricle frontal horn) is occult by CT  and was better appreciated on the brain MRI performed earlier today. No CT evidence of interval acute infarct. Minimal chronic small-vessel image changes within the cerebral Brandon matter and pons, better appreciated on the prior brain MRI. There is no acute intracranial hemorrhage. No extra-axial fluid collection. No evidence of an intracranial mass. No midline shift. Vascular: No hyperdense vessel. Skull: No fracture or aggressive osseous lesion. Sinuses/Orbits: No mass or acute finding within the imaged orbits. Trace mucosal thickening within the anterior left ethmoid air cells. ASPECTS ([place] Stroke Program Early CT Score) - Ganglionic level infarction (caudate, lentiform nuclei, internal capsule, insula, M1-M3 cortex): 7 - Supraganglionic infarction (M4-M6 cortex): 3 Total score (0-10 with 10 being normal): 10 These results were communicated to Dr. Leonel Ramsay at 6:27 pmon 8/29/2023by text page via the [facility] messaging system. IMPRESSION: A known subcentimeter acute infarct within the left frontal lobe Mar[place] matter is occult by CT, and was better appreciated on the brain MRI performed earlier today. No CT evidence of interval acute intracranial abnormality. Minimal chronic small-vessel ischemic changes within the  cerebral Wiseman matter and pons. Electronically Signed   By: Kellie Simmering D.O.   On: 01/21/2022 18:27   ECHOCARDIOGRAM COMPLETE  Result Date: 01/21/2022    ECHOCARDIOGRAM REPORT   Patient Name:   ERINA HAMME Arteaga Date of Exam: 01/21/2022 Medical Rec #:  [id number]    Height:       61.0 in Accession #:    [id number]   Weight:       171.0 lb Date of Birth:  1955-10-20    BSA:          1.767 m Patient Age:    51 years     BP:           146/125 mmHg Patient Gender: F            HR:           72 bpm. Exam Location:  Inpatient Procedure: 2D Echo, Color Doppler and Cardiac Doppler Indications:    Stroke i63.9  History:        Patient has no prior history of Echocardiogram examinations.                 Risk Factors:Hypertension.  Sonographer:    Raquel Sarna Senior RDCS Referring Phys: [place]  1. Left ventricular ejection fraction, by estimation, is 45 to 50%. The left ventricle has mildly decreased function. The left ventricle demonstrates regional wall motion abnormalities (apical hypokinesis with out true aneurysm and with presence of an LV thrombus 1.84 X 0.9 X 1.2 cm).  2. The mitral valve is grossly normal. No evidence of mitral valve regurgitation. No evidence of mitral stenosis.  3. The aortic valve is tricuspid. Aortic valve regurgitation is not visualized. No aortic stenosis is present.  4. Right ventricular systolic function is normal. The right ventricular size is normal. Tricuspid regurgitation signal is inadequate for assessing PA pressure.  5. The inferior vena cava is normal in size with greater than 50% respiratory variability, suggesting right atrial pressure of 3 mmHg. Comparison(s): No prior Echocardiogram. 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 Cardiology aware. FINDINGS  Left Ventricle: Left ventricular ejection fraction, by estimation, is 45 to 50%. The left ventricle has mildly decreased function. The left ventricle demonstrates regional wall  motion abnormalities. The left ventricular internal cavity size was small. There is no left ventricular hypertrophy. Left ventricular diastolic parameters are consistent with Grade I diastolic dysfunction (  impaired relaxation).  LV Wall Scoring: The apical lateral segment, apical septal segment, apical anterior segment, and apical inferior segment are hypokinetic. Right Ventricle: The right ventricular size is normal. No increase in right ventricular wall thickness. Right ventricular systolic function is normal. Tricuspid regurgitation signal is inadequate for assessing PA pressure. Left Atrium: Left atrial size was normal in size. Right Atrium: Right atrial size was normal in size. Pericardium: There is no evidence of pericardial effusion. Mitral Valve: The mitral valve is grossly normal. No evidence of mitral valve regurgitation. No evidence of mitral valve stenosis. Tricuspid Valve: The tricuspid valve is normal in structure. Tricuspid valve regurgitation is not demonstrated. No evidence of tricuspid stenosis. Aortic Valve: The aortic valve is tricuspid. Aortic valve regurgitation is not visualized. No aortic stenosis is present. Pulmonic Valve: The pulmonic valve was normal in structure. Pulmonic valve regurgitation is not visualized. No evidence of pulmonic stenosis. Aorta: The aortic root and ascending aorta are structurally normal, with no evidence of dilitation. Venous: The inferior vena cava is normal in size with greater than 50% respiratory variability, suggesting right atrial pressure of 3 mmHg. IAS/Shunts: No atrial level shunt detected by color flow Doppler.  LEFT VENTRICLE PLAX 2D LVIDd:         3.60 cm     Diastology LVIDs:         2.50 cm     LV e' medial:    6.68 cm/s LV PW:         0.80 cm     LV E/e' medial:  6.5 LV IVS:        0.80 cm     LV e' lateral:   11.00 cm/s LVOT diam:     1.70 cm     LV E/e' lateral: 3.9 LV SV:         42 LV SV Index:   24 LVOT Area:     2.27 cm  LV Volumes (MOD) LV  vol d, MOD A2C: 70.1 ml LV vol d, MOD A4C: 46.6 ml LV vol s, MOD A2C: 37.3 ml LV vol s, MOD A4C: 23.3 ml LV SV MOD A2C:     32.8 ml LV SV MOD A4C:     46.6 ml LV SV MOD BP:      28.7 ml RIGHT VENTRICLE RV S prime:     9.32 cm/s TAPSE (M-mode): 1.7 cm LEFT ATRIUM             Index        RIGHT ATRIUM          Index LA diam:        2.70 cm 1.53 cm/m   RA Area:     9.52 cm LA Vol (A2C):   28.1 ml 15.90 ml/m  RA Volume:   20.50 ml 11.60 ml/m LA Vol (A4C):   26.3 ml 14.88 ml/m LA Biplane Vol: 28.8 ml 16.30 ml/m  AORTIC VALVE LVOT Vmax:   115.00 cm/s LVOT Vmean:  68.700 cm/s LVOT VTI:    0.185 m  AORTA Ao Root diam: 2.90 cm Ao Asc diam:  3.20 cm MITRAL VALVE MV Area (PHT): 3.11 cm    SHUNTS MV Decel Time: 244 msec    Systemic VTI:  0.18 m MV E velocity: 43.20 cm/s  Systemic Diam: 1.70 cm MV A velocity: 64.50 cm/s MV E/A ratio:  0.67 Rudean Haskell MD Electronically signed by Rudean Haskell MD Signature Date/Time: 01/21/2022/4:04:02 PM    Final    MR  BRAIN WO CONTRAST  Result Date: 01/21/2022 CLINICAL DATA:  TIA.  Sudden onset of aphasia, now resolved. EXAM: MRI HEAD WITHOUT CONTRAST TECHNIQUE: Multiplanar, multiecho pulse sequences of the brain and surrounding structures were obtained without intravenous contrast. COMPARISON:  None Available. FINDINGS: Brain: Small acute infarct in the left frontal [place] matter. No hemorrhage, hydrocephalus, mass, or atrophy. Minimal chronic small vessel changes may be present in the pons. No prior infarct is seen. Vascular: Major flow voids are preserved Skull and upper cervical spine: Normal marrow signal. Degenerative facet spurring where covered in the cervical spine with mild C2-3 anterolisthesis. Sinuses/Orbits: Negative IMPRESSION: Small acute infarct in the left frontal Ha[place] matter. Electronically Signed   By: Jorje Guild M.D.   On: 01/21/2022 12:32     PHYSICAL EXAM  Temp:  [97.8 F (36.6 C)-98.6 F (37 C)] 98.2 F (36.8 C) (09/08 0800) Pulse  Rate:  [72-89] 83 (09/08 0800) Resp:  [16-22] 16 (09/08 0800) BP: (109-148)/(74-90) 118/74 (09/08 0800) SpO2:  [98 %-100 %] 99 % (09/08 0800)  General - Well nourished, well developed, in no apparent distress.  Ophthalmologic - fundi not visualized due to noncooperation.  Cardiovascular - Regular rhythm and rate.  Neuro - awake, alert, eyes open, expressive aphasia, able to make sounds out but not meaningful words, following most simple commands.  Not able to name and repeat. No gaze palsy, tracking bilaterally, blinking to visual threat bilaterally.  Right mild facial droop. Tongue protrusion difficulty. LUE 5/5, no drift, RUE 3/5 proximal and 0/5 distally. Bilaterally LEs 5/5, no drift. Sensation symmetrical bilaterally subjectively, left FTN intact, gait not tested.    ASSESSMENT/PLAN Ms. VALORI HOLLENKAMP is a 66 y.o. 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 No tPA given due to on Eliquis and recent stroke.    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 INR goal 2-3. Continue ASA 81 per cardiology. Ongoing aggressive stroke risk factor management Therapy  recommendations:  CIR Disposition:  pending  Hx of stroke and LV thrombus 8/29 admitted for nausea vomiting dizziness chest pain and then developed right facial droop and aphasia.  Found to have troponin > 300, EF 45 to 50% with LV thrombus.  CTA coronary artery showed minimal CAD.  CT no acute abnormality.  CTA head and neck showed left M2 occlusion.  CTP 0/7.  Status post EVT with TICI2c.  MRI showed left frontal Matt[place] matter punctate infarct.  Repeat MRI showed left external capsule, insular cortex and frontal cortex punctate infarcts.  LDL 150, A1c 5.0.  UDS positive for THC.  Patient was put on heparin IV and aspirin and eventually discharged on Eliquis and aspirin as well as Crestor 20. Per family, patient has slight expressive aphasia since discharge, no  physical deficit. Cardiology Dr. Johnsie Cancel on board, recommend coumadin with INR goal 2-3. on lovenox bridge.   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  Lupus - ANA and ds DNA negative  Hospital day # 3  Beulah Gandy DNP, ACNPC-AG  ATTENDING NOTE: I reviewed above note and agree with the assessment and plan. Pt was seen and examined.   Daughter at bedside.  Patient symptoms gradually improving.  Able to have more sound out but not words yet. Trying to repeat 3-word sentences.  Follows simple commands.  Right upper extremity proximal 3/5, distal 0/5.  PT no recommendation, however OT and speech recommend CIR.  Hypercoagulable work-up all negative.  Passed swallow, currently on aspirin and Coumadin  with INR goal 2-3 and Lovenox bridge.  Continue Crestor.  For detailed assessment and plan, please refer to above/below as I have made changes wherever appropriate.   Rosalin Hawking, MD PhD Stroke Neurology 01/31/2022 7:10 PM

## 2022-01-31 NOTE — Progress Notes (Signed)
  Transition of Care [facility]) Screening Note   Patient Details  Name: Chloe Johnson Date of Birth: 04/07/1956   Transition of Care [facility]) CM/SW Contact:    Kermit Balo, RN Phone Number: 01/31/2022, 3:47 PM   Pt is from home with spouse. CIR following. Transition of Care Department [facility]) has reviewed patient. We will continue to monitor patient advancement through interdisciplinary progression rounds. If new patient transition needs arise, please place a TOC consult.

## 2022-01-31 NOTE — PMR Pre-admission (Signed)
PMR Admission Coordinator Pre-Admission Assessment  Patient: Chloe Johnson is an 66 y.o., female MRN: [id number] DOB: Sep 17, 1955 Height: $RemoveBefo'5\' 1"'[id number]$  (154.9 cm) Weight: 59 kg  Insurance Information HMO:     PPO: yes     PCP:      IPA:      80/20:      OTHER:  PRIMARY: BCBS/Federal Emp      Policy#: [id number]      Subscriber: [place] Name: Edrick Oh       Phone#: [phone]     Fax#: [phone] Beverlee Nims with [employer] called with Josem Kaufmann on 9/12 with approval 9/12-9/20 with updates due 9/52 Pre-Cert#: [id number]      Employer:  Benefits:  Phone #: [phone]     Name:  Eff. Date: 06/01/17-still active     Deduct: $350 ($350 met)      Out of Pocket Max: $6,000 ($3,278 met)      Life Max: NA CIR: $450 copay/admission      SNF: $275 copay/admission; limited to 30 days/cal yr Outpatient: 65% coverage , limited to 75 visits/cal yr    Co-Pay: 35% co-insurance Home Health: 65% coverage, limited to 50 visits/cal yr      Co-Pay: 35% co-insurance DME: 65% coverage     Co-Pay: 35% co-insurance Providers: in-network SECONDARY:       Policy#:      Phone#:   Development worker, community:       Phone#:   The Engineer, petroleum" for patients in Inpatient Rehabilitation Facilities with attached "Privacy Act [place] Records" was provided and verbally reviewed with: N/A  Emergency Contact Information Contact Information     Name Relation Home Work Mobile   [place] [phone]  [phone]   Tosco,Vincent Son [phone]  [phone]   Tosco,Emily Daughter [phone]  [phone]   [place] Koller Daughter [phone]         Current Medical History  Patient Admitting Diagnosis: CVA History of Present Illness: Chloe Johnson is a 66 year old right-handed female with history significant for 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  Per chart review patient lives with spouse.  Multilevel home.  Husband works during the day.  Patient reported independent prior to admission without assistive device.  Presented 01/28/2022 with acute onset of right-sided weakness and aphasia.  Patient just recently returned home from traveling to [place] 5 days ago.  CT/MRI showed new acute infarct in left posterior frontal and anterior parietal cortex.  MRA showed no intracranial large vessel occlusion or significant stenosis.  Underwent bilateral common carotid arteriogram as well as right vertebral artery angiogram showing occluded superior division of left MCA artery in the M2 region and underwent revascularization per interventional radiology.  Hospital course follow-up neurology as well as cardiology and with noted history of recent CVA secondary to LV thrombus maintained on Eliquis was switched to Coumadin with Lovenox bridging INR goal 2.5.  Hospital course urinalysis positive nitrite 21-50 WBCs and placed on course of Rocephin x3 days.  Therapy evaluations completed due to patient decreased functional mobility right side weakness was admitted for a comprehensive rehab program. Complete NIHSS TOTAL: 5  Patient's medical record from [facility] has been reviewed by the rehabilitation admission coordinator and physician.  Past Medical  History  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/16/2013   L3-4/notes 11/24/2013, arms, neck   Exertional asthma    GERD (gastroesophageal reflux disease)    Hypertension    Kidney stones    "have always passed them"   Neck pain    Osteomyelitis ([place]) 11/23/2013   osteomyelitis, discitis    Sleep concern    uses Prozac  for sleep     Has the patient had major surgery during 100 days prior to admission? Yes  Family History   family history includes Hypertension in her father; Other in her father and mother.  Current Medications  Current Facility-Administered Medications:    acetaminophen (TYLENOL) tablet 650 mg, 650 mg, Oral, Q4H PRN, 650 mg at 02/03/22 0918 **OR** acetaminophen (TYLENOL) 160 MG/5ML solution 650 mg, 650 mg, Oral, Q4H PRN **OR** acetaminophen (TYLENOL) suppository 650 mg, 650 mg, Rectal, Q4H PRN, Rosalin Hawking, MD   ALPRAZolam Duanne Moron) tablet 0.5 mg, 0.5 mg, Oral, QHS, Rosalin Hawking, MD, 0.5 mg at 02/02/22 2056   aspirin EC tablet 81 mg, 81 mg, Oral, Daily, Rosalin Hawking, MD, 81 mg at 02/03/22 0919   cefTRIAXone (ROCEPHIN) 1 g in sodium chloride 0.9 % 100 mL IVPB, 1 g, Intravenous, Q24H, Smith, Rondell A, MD, Stopped at 02/03/22 0957   Chlorhexidine Gluconate Cloth 2 % PADS 6 each, 6 each, Topical, Daily, Bhagat, Srishti L, MD, 6 each at 02/03/22 0927   diphenhydrAMINE (BENADRYL) injection 12.5 mg, 12.5 mg, Intravenous, Q6H PRN, Tamala Julian, Rondell A, MD   docusate (COLACE) 50 MG/5ML liquid 100 mg, 100 mg, Oral, BID, Rosalin Hawking, MD, 100 mg at 02/03/22 0926   enoxaparin (LOVENOX) injection 60 mg, 60 mg, Subcutaneous, Q12H, Rosalin Hawking, MD, 60 mg at 02/03/22 0519   fentaNYL (SUBLIMAZE) injection 50 mcg, 50 mcg, Intravenous, Q3H PRN, Jacky Kindle, MD, 50 mcg at 02/02/22 0440   ferrous sulfate tablet 325 mg, 325 mg, Oral, Q breakfast, Palikh, Gaurang M, MD, 325 mg at 02/03/22 0919   hydrALAZINE (APRESOLINE) injection 10-40 mg, 10-40 mg, Intravenous, Q4H PRN, Rollene Rotunda, John D, PA-C   loratadine (CLARITIN) tablet 10 mg, 10 mg, Oral, Daily, Rosalin Hawking, MD, 10 mg at 02/03/22 0918   ondansetron (ZOFRAN) injection 4 mg, 4 mg, Intravenous, Q6H PRN, Tamala Julian, Rondell A, MD, 4 mg at 02/03/22 1108   Oral care mouth rinse, 15 mL, Mouth Rinse, PRN, Rosalin Hawking, MD   polyethylene glycol (MIRALAX / GLYCOLAX) packet 17 g, 17  g, Oral, Daily, Rosalin Hawking, MD, 17 g at 02/03/22 0918   pregabalin (LYRICA) capsule 25 mg, 25 mg, Oral, BID, Rosalin Hawking, MD, 25 mg at 02/03/22 0918   rosuvastatin (CRESTOR) tablet 20 mg, 20 mg, Oral, Daily, Rosalin Hawking, MD, 20 mg at 02/03/22 1654   sacubitril-valsartan (ENTRESTO) 24-26 mg per tablet, 1 tablet, Oral, BID, Rosalin Hawking, MD, 1 tablet at 02/03/22 0918   sodium chloride flush (NS) 0.9 % injection 10-40 mL, 10-40 mL, Intracatheter, Q12H, Bhagat, Srishti L, MD, 40 mL at 02/03/22 0926   sodium chloride flush (NS) 0.9 % injection 10-40 mL, 10-40 mL, Intracatheter, PRN, Bhagat, Srishti L, MD   warfarin (COUMADIN) tablet 6 mg, 6 mg, Oral, ONCE-1600, Donnamae Jude, [facility]   Warfarin - Pharmacist Dosing Inpatient, , Does not apply, K1243, Rosalin Hawking, MD, Given at 02/02/22 1720  Patients Current Diet:  Diet Order  Diet regular Room service appropriate? Yes; Fluid consistency: Thin  Diet effective now                   Precautions / Restrictions Precautions Precautions: Other (comment) Precaution Comments: SBP goal < 160 Restrictions Weight Bearing Restrictions: No   Has the patient had 2 or more falls or a fall with injury in the past year? Yes  Prior Activity Level Community (5-7x/wk): was working and driving prior to August  Prior Functional Level Self Care: Did the patient need help bathing, dressing, using the toilet or eating? Independent  Indoor Mobility: Did the patient need assistance with walking from room to room (with or without device)? Independent  Stairs: Did the patient need assistance with internal or external stairs (with or without device)? Independent  Functional Cognition: Did the patient need help planning regular tasks such as shopping or remembering to take medications? Independent  Patient Information Are you of Hispanic, Latino/a,or Spanish origin?: A. No, not of Hispanic, Latino/a, or Spanish origin What is your race?: X. Patient  unable to respond, A. [place] Do you need or want an interpreter to communicate with a doctor or health care staff?: 0. No  Patient's Response To:  Health Literacy and Transportation Is the patient able to respond to health literacy and transportation needs?: Yes Health Literacy - How often do you need to have someone help you when you read instructions, pamphlets, or other written material from your doctor or pharmacy?: Never In the past 12 months, has lack of transportation kept you from medical appointments or from getting medications?: No In the past 12 months, has lack of transportation kept you from meetings, work, or from getting things needed for daily living?: No  Home Assistive Devices / Equipment Home Equipment: Conservation officer, nature (2 wheels), Rollator (4 wheels), [employer] - single point, Civil engineer, contracting  Prior Device Use: Indicate devices/aids used by the patient prior to current illness, exacerbation or injury? None of the above  Current Functional Level Cognition  Arousal/Alertness: Awake/alert Overall Cognitive Status: Difficult to assess Difficult to assess due to: Impaired communication Orientation Level: Other (comment) (alert, unable to assess orientation due expressive aphasia) General Comments: pt with expressive commuincation deficits, using commuincation board as needed to express needs but also able to voice several words including "thank you". Focused Attention: Appears intact Sustained Attention: Appears intact Memory:  (unable to assess) Awareness:  (UTA) Problem Solving:  (UTA) Safety/Judgment: Other (comment) Comments: UTA secondary to nonverbal    Extremity Assessment (includes Sensation/Coordination)  Upper Extremity Assessment: RUE deficits/detail RUE Deficits / Details: demonstrates active 3-/5 shoulder movement, gravity eliminated elbow flexion/extension.  Tone noted in forearm supinators and wrist flexors. RUE Sensation: decreased light touch, decreased  proprioception RUE Coordination: decreased fine motor, decreased gross motor  Lower Extremity Assessment: Defer to PT evaluation    ADLs  Overall ADL's : Needs assistance/impaired Eating/Feeding: NPO Grooming: Minimal assistance, Moderate assistance, Standing Grooming Details (indicate cue type and reason): focus on integration of R hand/ue as much as possible during tasks Upper Body Bathing: Minimal assistance, Sitting Lower Body Bathing: Minimal assistance, Sit to/from stand Upper Body Dressing : Minimal assistance, Sitting Lower Body Dressing: Minimal assistance, Sit to/from stand Toilet Transfer: Min guard, Ambulation Toileting- Clothing Manipulation and Hygiene: Supervision/safety, Sitting/lateral lean Functional mobility during ADLs: Min guard General ADL Comments: session focused on RUE AROM, stretch    Mobility  Overal bed mobility: Modified Independent Bed Mobility: Supine to Sit, Sit to Supine Supine to sit:  Modified independent (Device/Increase time), HOB elevated Sit to supine: Modified independent (Device/Increase time), HOB elevated General bed mobility comments: HOB elevated, no assistance needed    Transfers  Overall transfer level: Needs assistance Equipment used: None Transfers: Sit to/from Stand Sit to Stand: Supervision General transfer comment: Supervision for safety, no LOB    Ambulation / Gait / Stairs / Wheelchair Mobility  Ambulation/Gait Ambulation/Gait assistance: Modified independent (Device/Increase time), Supervision Gait Distance (Feet): 160 Feet Assistive device: None Gait Pattern/deviations: WFL(Within Functional Limits) General Gait Details: Pt ambulating at a functional pace without LOB, even with DGI challenges. Progressed from supervision initially for safety to mod I. Holds R UE up in elbow flexion and close to abdomen due to biceps spasticity. Gait velocity: reduced Gait velocity interpretation: >4.37 ft/sec, indicative of normal walking  speed Stairs: Yes Stairs assistance: Supervision Stair Management: No rails, Alternating pattern, Forwards Number of Stairs: 3 General stair comments: Ascends and descends stairs without LOB, supervision for safety    Posture / Balance Balance Overall balance assessment: Needs assistance Sitting-balance support: No upper extremity supported, Feet supported Sitting balance-Leahy Scale: Normal Standing balance support: No upper extremity supported, During functional activity Standing balance-Leahy Scale: Normal Standing balance comment: No LOB when challenging dynamic gait balance Standardized Balance Assessment Standardized Balance Assessment : Dynamic Gait Index Dynamic Gait Index Level Surface: Normal Change in Gait Speed: Normal Gait with Horizontal Head Turns: Normal Gait with Vertical Head Turns: Normal Gait and Pivot Turn: Normal Step Over Obstacle: Normal Step Around Obstacles: Normal Steps: Normal Total Score: 24    Special needs/care consideration Skin Ecchymosis: arm, abdomen, hip/bilateral, upper, lower, left, right   Previous Home Environment (from acute therapy documentation) Living Arrangements: Spouse/significant other  Lives With: Spouse Available Help at Discharge: Family, Friend(s), Available 24 hours/day Type of Home: House Home Layout: Two level Alternate Level Stairs-Rails: Right Alternate Level Stairs-Number of Steps: flight Home Access: Stairs to enter Entrance Stairs-Rails: Left Entrance Stairs-Number of Steps: 5 Bathroom Shower/Tub: Gaffer, Chiropodist: Standard Bathroom Accessibility: Yes How Accessible: Accessible via walker Home Care Services: No Additional Comments: Husband works, able to take time off initially if needed  Discharge Living Setting Plans for Discharge Living Setting: Patient's home Type of Home at Discharge: House Discharge Home Layout: Two level Alternate Level Stairs-Rails: Right Alternate  Level Stairs-Number of Steps: flight Discharge Home Access: Stairs to enter Entrance Stairs-Rails: Left Entrance Stairs-Number of Steps: 5 Discharge Bathroom Shower/Tub: Walk-in shower, Tub/shower unit Discharge Bathroom Toilet: Standard Discharge Bathroom Accessibility: Yes How Accessible: Accessible via walker  Social/Family/Support Systems Anticipated Caregiver: Teyah Rossy, husband and family/friends Anticipated Caregiver's Contact Information: [phone] Caregiver Availability: 24/7 Discharge Plan Discussed with Primary Caregiver: Yes Is Caregiver In Agreement with Plan?: Yes Does Caregiver/Family have Issues with Lodging/Transportation while Pt is in Rehab?: No  Goals Patient/Family Goal for Rehab: Supervision: OT. Min-Mod A:ST Expected length of stay: 7-10 days Pt/Family Agrees to Admission and willing to participate: Yes Program Orientation Provided & Reviewed with Pt/Caregiver Including Roles  & Responsibilities: Yes  Decrease burden of Care through IP rehab admission: NA  Possible need for SNF placement upon discharge: Not anticipated  Patient Condition: I have reviewed medical records from [facility], spoken with CM, and patient, spouse, and daughter. I met with patient at the bedside for inpatient rehabilitation assessment.  Patient will benefit from ongoing OT and SLP, can actively participate in 3 hours of therapy a day 5 days of the week, and can make measurable gains  during the admission.  Patient will also benefit from the coordinated team approach during an Inpatient Acute Rehabilitation admission.  The patient will receive intensive therapy as well as Rehabilitation physician, nursing, social worker, and care management interventions.  Due to safety, skin/wound care, disease management, medication administration, pain management, and patient education the patient requires 24 hour a day rehabilitation nursing.  The patient is currently min A-mod I  with  mobility and basic ADLs.  Discharge setting and therapy post discharge at home with outpatient is anticipated.  Patient has agreed to participate in the Acute Inpatient Rehabilitation Program and will admit today.  Preadmission Screen Completed By:  Bethel Born, 02/03/2022 4:08 PM ______________________________________________________________________   Discussed status with Dr. Curlene Dolphin on 02/04/22 at 19 and received approval for admission today.  Admission Coordinator:  Bethel Born, [place] updates by Clemens Catholic, MS, CCC-SLP time 10:14 on 02/04/22   Assessment/Plan: Diagnosis: CVA Does the need for close, 24 hr/day Medical supervision in concert with the patient's rehab needs make it unreasonable for this patient to be served in a less intensive setting? Yes Co-Morbidities requiring supervision/potential complications:  UTI, HTN, HLD, Cardiomyopathy, Lumbar stenosis with foot drop, back pain Due to bladder management, bowel management, safety, skin/wound care, disease management, medication administration, pain management, and patient education, does the patient require 24 hr/day rehab nursing? Yes Does the patient require coordinated care of a physician, rehab nurse, PT, OT, and SLP to address physical and functional deficits in the context of the above medical diagnosis(es)? Yes Addressing deficits in the following areas: balance, endurance, locomotion, strength, transferring, bowel/bladder control, bathing, dressing, feeding, grooming, toileting, cognition, speech, language, and psychosocial support Can the patient actively participate in an intensive therapy program of at least 3 hrs of therapy 5 days a week? Yes The potential for patient to make measurable gains while on inpatient rehab is excellent Anticipated functional outcomes upon discharge from inpatient rehab: n/a PT, supervision OT, min assist and mod assist SLP Estimated rehab length of stay to reach  the above functional goals is: 7-10 days Anticipated discharge destination: Home 10. Overall Rehab/Functional Prognosis: excellent   MD Signature: Jennye Boroughs

## 2022-01-31 NOTE — Progress Notes (Signed)
Cardiologist Nahser  Subjective:  Denies SSCP, palpitations or Dyspnea   Objective:  Vitals:   01/30/22 2139 01/30/22 2316 01/31/22 0334 01/31/22 0800  BP: 138/83 132/81 109/75 118/74  Pulse: 87 80 72 83  Resp: 16 18 18 16   Temp: 98.6 F (37 C) 98.5 F (36.9 C) 98.2 F (36.8 C) 98.2 F (36.8 C)  TempSrc: Oral Oral Oral Oral  SpO2: 100% 98% 98% 99%  Weight:      Height:        Intake/Output from previous day:  Intake/Output Summary (Last 24 hours) at 01/31/2022 0823 Last data filed at 01/30/2022 1608 Gross per 24 hour  Intake 271.71 ml  Output 250 ml  Net 21.71 ml    Physical Exam: Left facial droop  Right hemiparesis  Lungs clear No murmur  No edema  Lab Results: Basic Metabolic Panel: Recent Labs    01/30/22 1620 01/31/22 0415  NA 138 139  K 3.5 3.1*  CL 109 109  CO2 22 24  GLUCOSE 98 92  BUN <5* <5*  CREATININE 0.56 0.61  CALCIUM 8.6* 8.6*  MG 1.9  --   PHOS 1.8*  --    Liver Function Tests: Recent Labs    01/28/22 1330  AST 22  ALT 18  ALKPHOS 45  BILITOT 0.3  PROT 6.4*  ALBUMIN 3.9   No results for input(s): "LIPASE", "AMYLASE" in the last 72 hours. CBC: Recent Labs    01/28/22 1330 01/28/22 1337 01/29/22 0300 01/30/22 0143 01/31/22 0415  WBC 4.7  --  8.6 5.3 5.2  NEUTROABS 2.5  --  6.1  --   --   HGB 12.0   < > 11.2* 9.2* 9.3*  HCT 35.5*   < > 33.3* 27.3* 28.0*  MCV 88.5  --  87.6 86.4 87.0  PLT 171  --  173 143* 157   < > = values in this interval not displayed.    Fasting Lipid Panel: Recent Labs    01/29/22 0300  TRIG 271*   Thyroid Function Tests: Recent Labs    01/30/22 0143  TSH 1.712   Anemia Panel: Recent Labs    01/30/22 0143  VITAMINB12 487    Imaging: DG Swallowing Func-Speech Pathology  Result Date: 01/30/2022 Table formatting from the original result was not included. Images from the original result were not included. Objective Swallowing Evaluation: Type of Study: MBS-Modified Barium Swallow  Study  Patient Details Name: Chloe Johnson MRN: [id number] Date of Birth: 1955-08-11 Today's Date: 01/30/2022 Time: SLP Start Time (ACUTE ONLY): 35 -SLP Stop Time (ACUTE ONLY): 1200 SLP Time Calculation (min) (ACUTE ONLY): 25 min Past Medical History: Past Medical History: Diagnosis Date  Anxiety   Back pain   Discitis of lumbar region 11/16/2013  L3-4/notes 11/24/2013, arms, neck  Exertional asthma   GERD (gastroesophageal reflux disease)   Hypertension   Kidney stones   "have always passed them"  Neck pain   Osteomyelitis ([place]) 11/23/2013  osteomyelitis, discitis   Sleep concern   uses Prozac for sleep  Past Surgical History: Past Surgical History: Procedure Laterality Date  BREAST CYST EXCISION Left 2010  "polypectomy"  IR CT HEAD LTD  01/21/2022  IR CT HEAD LTD  01/28/2022  IR PERCUTANEOUS ART THROMBECTOMY/INFUSION INTRACRANIAL INC DIAG ANGIO  01/21/2022  IR PERCUTANEOUS ART THROMBECTOMY/INFUSION INTRACRANIAL INC DIAG ANGIO  01/28/2022  IR US GUIDE VASC ACCESS RIGHT  01/22/2022  PICC LINE PLACE PERIPHERAL ([place] HX) Right   for use of Levaquin &  Vancomycin, of note: she reports that she had a "flulike feeling"   RADIOLOGY WITH ANESTHESIA N/A 01/21/2022  Procedure: IR WITH ANESTHESIA;  Surgeon: Julieanne Cotton, MD;  Location: [facility] OR;  Service: Radiology;  Laterality: N/A;  RADIOLOGY WITH ANESTHESIA N/A 01/28/2022  Procedure: IR WITH ANESTHESIA;  Surgeon: Radiologist, Medication, MD;  Location: [facility] OR;  Service: Radiology;  Laterality: N/A;  TONSILLECTOMY AND ADENOIDECTOMY  1975  TUBAL LIGATION  1999 HPI: Patient is a 66 y.o. 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 On 01/25/22, patient began to have symptoms of dizziness followed by nausea and vomiting. Symptoms improved but then on 9/5 but she felt generally weak and was having trouble finding words. Husband  observed right facial droop. [facility] ED as code stroke on 01/28/22; CT head without acute abnormality but CTA unable to be performed due to IV infiltration. She was taken directly to IR for thrombectomy and intubated for this procedure, extubated in morning of 01/29/22. MRI brain showed new acute infarcts in left posterior frontal and anterior parietal cortex.  Subjective: awake, alert, husband and two daughters present in room  Recommendations for follow up therapy are one component of a multi-disciplinary discharge planning process, led by the attending physician.  Recommendations may be updated based on patient status, additional functional criteria and insurance authorization. Assessment / Plan / Recommendation   01/30/2022   1:15 PM Clinical Impressions Clinical Impression Pt demonstrates oropharygneal dysphagia with mild oral impairment secondary to right CN VII and XII weakness and potential right sided sensory changes. Pt has decreased labial seal on the right with anterior spillage, improved with cues to increase labial tension with straw or cup. There is also decreased bolus cohesion and mild lingual residue unless cued for an oral hold. Pt initiates swallow as the bolus arrives past the valleculae resulting in instances of flash penetration or sensed aspiration (consecutive straw sips). Pt improved with single sips and oral hold prior to swallow. There was questionable trace silent aspiration of light barium pooled in pyriforms; penetrated post swallow. However this finding is inconclusive and  likely negligible given pts sensation and ejection of mild aspirate in prior event. Recommend initiating nectar thick liquids and dys 3 solids to aid in pt relearning swallowing motor patterns with a slower, more manageable texture prior to advancement to thin in 1-2 days if successful. 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);Dysphagia, unspecified (R13.10) Impact on safety and function Mild aspiration risk      01/30/2022   1:15 PM Treatment Recommendations Treatment Recommendations Therapy as outlined in treatment plan below     01/30/2022   1:15 PM Prognosis Prognosis for Safe Diet Advancement Good   01/30/2022   1:15 PM Diet Recommendations SLP Diet Recommendations Nectar thick liquid;Thin liquid;Dysphagia 3 (Mech soft) solids Liquid Administration via Cup;Straw Medication Administration Whole meds with puree Compensations Slow rate;Small sips/bites;Clear throat intermittently Postural Changes Remain semi-upright after after feeds/meals (Comment);Seated upright at 90 degrees     01/30/2022   1:15 PM Other Recommendations Oral Care Recommendations Oral care BID Other Recommendations Have oral suction available Follow Up Recommendations Acute inpatient rehab (3hours/day) Assistance recommended at discharge Frequent or constant Supervision/Assistance Functional Status Assessment Patient has had a recent decline in their functional status and demonstrates the ability to make significant improvements in function in a reasonable and predictable amount of  time.   01/30/2022   1:15 PM Frequency and Duration  Speech Therapy Frequency (ACUTE ONLY) min 2x/week Treatment Duration 2 weeks     01/30/2022   1:15 PM Oral Phase Oral Phase Impaired Oral - Thin Cup Decreased bolus cohesion Oral - Thin Straw Decreased bolus cohesion;Premature spillage;Lingual/palatal residue Oral - Puree WFL Oral - Regular Weak lingual manipulation;Lingual/palatal residue    01/30/2022   1:15 PM Pharyngeal Phase Pharyngeal Phase Impaired Pharyngeal- Nectar Cup Pharyngeal residue - pyriform Pharyngeal- Nectar Straw Pharyngeal residue - pyriform Pharyngeal- Thin Cup Pharyngeal residue - pyriform;Penetration/Aspiration during swallow;Penetration/Aspiration before swallow Pharyngeal Material enters airway, CONTACTS cords and not ejected out;Material enters airway, remains ABOVE vocal cords then ejected out;Material does not enter airway Pharyngeal- Thin Straw Delayed  swallow initiation-pyriform sinuses;Penetration/Aspiration before swallow;Penetration/Apiration after swallow;Pharyngeal residue - pyriform;Trace aspiration Pharyngeal Material enters airway, passes BELOW cords then ejected out;Material enters airway, CONTACTS cords and not ejected out;Material does not enter airway     No data to display    Harlon Ditty, MA CCC-SLP Acute Rehabilitation Services Secure Chat Preferred Office [phone] Claudine Mouton 01/30/2022, 1:48 PM                      Cardiac Studies:  ECG: SR rate 79 nonspecific ST changes    Telemetry: none  Echo: pending  Medications:    ALPRAZolam  0.5 mg Oral QHS   aspirin EC  81 mg Oral Daily   Chlorhexidine Gluconate Cloth  6 each Topical Daily   docusate  100 mg Oral BID   enoxaparin (LOVENOX) injection  60 mg Subcutaneous Q12H   loratadine  10 mg Oral Daily   polyethylene glycol  17 g Oral Daily   pregabalin  25 mg Oral BID   rosuvastatin  20 mg Oral Daily   sacubitril-valsartan  1 tablet Oral BID   sodium chloride flush  10-40 mL Intracatheter Q12H   Warfarin - Pharmacist Dosing Inpatient   Does not apply q1600      Assessment/Plan:   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 Plan to cover with lovenox and change to coumadin with target INR 2.5 Will arrange f/u in our coumadin clinic at [facility] Repeat TTE pending Low dose entresto started Volume status fine Swallowing study ok for nectar thick liquids Spoke with husband today  Charlton Haws 01/31/2022, 8:23 AM

## 2022-02-01 DIAGNOSIS — I639 Cerebral infarction, unspecified: Secondary | ICD-10-CM | POA: Diagnosis not present

## 2022-02-01 LAB — BASIC METABOLIC PANEL
Anion gap: 6 (ref 5–15)
BUN: <5 mg/dL — ABNORMAL LOW (ref 8–23)
CO2: 25 mmol/L (ref 22–32)
Calcium: 8.6 mg/dL — ABNORMAL LOW (ref 8.9–10.3)
Chloride: 110 mmol/L (ref 98–111)
Creatinine, Ser: 0.66 mg/dL (ref 0.44–1.00)
GFR, Estimated: >60 mL/min (ref >60)
Glucose, Bld: 109 mg/dL — ABNORMAL HIGH (ref 70–99)
Potassium: 3.4 mmol/L — ABNORMAL LOW (ref 3.5–5.1)
Sodium: 141 mmol/L (ref 135–145)

## 2022-02-01 LAB — CBC
HCT: 28.1 % — ABNORMAL LOW (ref 36.0–46.0)
Hemoglobin: 9.4 g/dL — ABNORMAL LOW (ref 12.0–15.0)
MCH: 29 pg (ref 26.0–34.0)
MCHC: 33.5 g/dL (ref 30.0–36.0)
MCV: 86.7 fL (ref 80.0–100.0)
Platelets: 178 10*3/uL (ref 150–400)
RBC: 3.24 MIL/uL — ABNORMAL LOW (ref 3.87–5.11)
RDW: 11.9 % (ref 11.5–15.5)
WBC: 4.8 10*3/uL (ref 4.0–10.5)
nRBC: 0 % (ref 0.0–0.2)

## 2022-02-01 LAB — PROTIME-INR
INR: 1.6 — ABNORMAL HIGH (ref 0.8–1.2)
Prothrombin Time: 18.8 seconds — ABNORMAL HIGH (ref 11.4–15.2)

## 2022-02-01 LAB — LUPUS ANTICOAGULANT PANEL
DRVVT: 35.5 s (ref 0.0–47.0)
PTT Lupus Anticoagulant: 41.2 s (ref 0.0–43.5)

## 2022-02-01 MED ORDER — FENTANYL CITRATE PF 50 MCG/ML IJ SOSY
12.5000 ug | PREFILLED_SYRINGE | Freq: Once | INTRAMUSCULAR | Status: AC
  Administered 2022-02-01: 12.5 ug via INTRAVENOUS
  Filled 2022-02-01: qty 1

## 2022-02-01 MED ORDER — WARFARIN SODIUM 5 MG PO TABS
5.0000 mg | ORAL_TABLET | Freq: Once | ORAL | Status: AC
Start: 2022-02-01 — End: 2022-02-01
  Administered 2022-02-01: 5 mg via ORAL
  Filled 2022-02-01: qty 1

## 2022-02-01 MED ORDER — BISACODYL 10 MG RE SUPP
10.0000 mg | Freq: Once | RECTAL | Status: AC
  Administered 2022-02-01: 10 mg via RECTAL
  Filled 2022-02-01: qty 1

## 2022-02-01 NOTE — Progress Notes (Signed)
Dr Fabio Bering rounding note reviewed from yesterday, cardiology plan as he has described in  his note. No additional changes at this time, we will sign off inpatient care.   Dina Rich MD

## 2022-02-01 NOTE — Plan of Care (Signed)
Called by RN regarding increased size of the left vascular access-site hematoma at the inguinal crease, now 2 x 4 cm and with some increased bruising. Also with increasing tenderness.   I have called [name] to discuss. He recommends holding pressure at the groin site for 30 minutes, keeping leg still without any flexion at hip for the remainder of the night as well as for patient to not get out of bed and to remain flat in the bed for the remainder of the night. I have called RN with verbal orders regarding the above given. Also administering 12.5 mcg fentanyl IV x 1 for pain.   Continue Coumadin and heparin bridge for now. Also continuing ASA. If the hematoma continues to enlarge, will need to hold anticoagulants and also notify Cardiology.   Electronically signed: Dr. Caryl Pina

## 2022-02-01 NOTE — Progress Notes (Signed)
ANTICOAGULATION CONSULT NOTE  Pharmacy Consult for Enoxaparin and Warfarin Indication: LV thrombus  Allergies  Allergen Reactions   Doxycycline Nausea Only    malaise    Erythromycin Nausea And Vomiting   Iodine     unknown   Latex Other (See Comments)    Irritation of the skin   Shellfish Allergy Other (See Comments)    GI- Facial  Acne   Strawberry Extract Other (See Comments)    Mouth ulcers    Vancomycin    Vicodin [Hydrocodone-Acetaminophen] Other (See Comments)    Restless, tolerate percocet   Dilaudid [Hydromorphone Hcl] Rash    *Pt states she can take if given benadryl prior*   Keflex [Cephalexin] Rash   Penicillins Rash    Hives   Sulfa Antibiotics Rash    Hives    Patient Measurements: Height: 5\' 1"  (154.9 cm) Weight: 59 kg (130 lb 1.1 oz) IBW/kg (Calculated) : 47.8  Vital Signs: Temp: 98.4 F (36.9 C) (09/09 0100) Temp Source: Oral (09/09 0100) BP: 131/85 (09/09 0100)  Labs: Recent Labs    01/29/22 0845 01/29/22 1745 01/30/22 0143 01/30/22 1000 01/30/22 1344 01/30/22 1620 01/31/22 0415 02/01/22 0700  HGB   < >  --  9.2*  --   --   --  9.3* 9.4*  HCT  --   --  27.3*  --   --   --  28.0* 28.1*  PLT  --   --  143*  --   --   --  157 178  APTT  --  58* 77* 72*  --   --   --   --   LABPROT  --   --   --   --  14.4  --  14.9 18.8*  INR  --   --   --   --  1.1  --  1.2 1.6*  HEPARINUNFRC  --  0.66 0.60 0.52  --   --   --   --   CREATININE  --   --  0.65  --   --  0.56 0.61 0.66   < > = values in this interval not displayed.     Estimated Creatinine Clearance: 58.7 mL/min (by C-G formula based on SCr of 0.66 mg/dL).   Medical History: 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/16/2013   L3-4/notes 11/24/2013, arms, neck   Exertional asthma    GERD (gastroesophageal reflux disease)    Hypertension    Kidney stones    "have always passed them"   Neck pain    Osteomyelitis ([facility]) 11/23/2013    osteomyelitis, discitis    Sleep concern    uses Prozac for sleep     Medications:  Medications Prior to Admission  Medication Sig Dispense Refill Last Dose   ALPRAZolam (XANAX) 0.5 MG tablet Take 0.5 mg by mouth at bedtime.   01/27/2022   apixaban (ELIQUIS) 5 MG TABS tablet Take 1 tablet (5 mg total) by mouth 2 (two) times daily. 60 tablet 1 01/28/2022 at 0800   cyclobenzaprine (FLEXERIL) 10 MG tablet Take 1 tablet (10 mg total) by mouth 3 (three) times daily as needed for muscle spasms. 90 tablet 5 01/27/2022   guaiFENesin (MUCINEX) 600 MG 12 hr tablet Take 600 mg by mouth daily as needed for cough or to loosen phlegm.   Past Month   loratadine (CLARITIN) 10 MG tablet Take 10  mg by mouth every morning.   01/28/2022   MYRBETRIQ 25 MG TB24 tablet Take 25 mg by mouth daily.   01/28/2022   pregabalin (LYRICA) 25 MG capsule Take 1 capsule (25 mg total) by mouth 2 (two) times daily. 60 capsule 0 01/28/2022   rosuvastatin (CRESTOR) 20 MG tablet Take 1 tablet (20 mg total) by mouth daily. 30 tablet 1 01/28/2022   sacubitril-valsartan (ENTRESTO) 24-26 MG Take 1 tablet by mouth 2 (two) times daily. 60 tablet 1 01/28/2022   Scheduled:   ALPRAZolam  0.5 mg Oral QHS   aspirin EC  81 mg Oral Daily   Chlorhexidine Gluconate Cloth  6 each Topical Daily   docusate  100 mg Oral BID   enoxaparin (LOVENOX) injection  60 mg Subcutaneous Q12H   loratadine  10 mg Oral Daily   polyethylene glycol  17 g Oral Daily   pregabalin  25 mg Oral BID   rosuvastatin  20 mg Oral Daily   sacubitril-valsartan  1 tablet Oral BID   sodium chloride flush  10-40 mL Intracatheter Q12H   Warfarin - Pharmacist Dosing Inpatient   Does not apply q1600    Assessment: 34 yoF with admitted for stroke s/p thrombectomy on 9/5. 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  Readmitted with recurrent CVA after 7 days of eliquis with compliance. Last Eliquis dose 9/5 @0800 .  Currently on warfarin while bridging with enoxaparin SQ. Pharmacy consulted  01/30/22 to assist with dosing.  INR 9/9 is 1.6, subtherapeutic after warfarin started 01/30/22.  CBC stable with no signs of bleeding. CrCl stable at 50-60 mL/min.  Goal of Therapy:  INR 2.5-3  (2.5 per Dr. 04/01/22) Monitor platelets by anticoagulation protocol: Yes   Plan:  Continue Enoxaparin 60mg  SQ BID at 1600 Warfarin 5 mg PO today x1 Check INR , CBC daily  Cardiologist noted target INR 2.5 and he plans for patient follow up at  coumadin clinic at [facility].    , PharmD PGY-2 Infectious Diseases Resident  02/01/2022 8:01 AM

## 2022-02-01 NOTE — Progress Notes (Addendum)
STROKE TEAM PROGRESS NOTE   SUBJECTIVE (INTERVAL HISTORY) Family is at the bedside.  No new neurological events overnight.  Family thinks she is improving.  INR still subtherapeutic this am. Cardiology singing off, appreciate their assistance.   OBJECTIVE Temp:  [98.3 F (36.8 C)-98.8 F (37.1 C)] 98.8 F (37.1 C) (09/09 1212) Pulse Rate:  [82-92] 92 (09/09 1212) Cardiac Rhythm: Normal sinus rhythm (09/09 0700) Resp:  [16-17] 16 (09/09 1212) BP: (104-131)/(63-85) 107/67 (09/09 1212) SpO2:  [98 %-100 %] 98 % (09/09 1212)  Recent Labs  Lab 01/28/22 1328 01/28/22 1747  GLUCAP 125* 103*    Recent Labs  Lab 01/29/22 0845 01/30/22 0143 01/30/22 1620 01/31/22 0415 02/01/22 0700  NA 138 139 138 139 141  K 3.5 2.9* 3.5 3.1* 3.4*  CL 110 112* 109 109 110  CO2 20* 21* _0 GLUCOSE 148* 100* 98 92 109*  BUN 6* <5* <5* <5* <5*  CREATININE 0.56 0.65 0.56 0.61 0.66  CALCIUM 8.8* 8.3* 8.6* 8.6* 8.6*  MG  --   --  1.9  --   --   PHOS  --   --  1.8*  --   --     Recent Labs  Lab 01/28/22 1330  AST 22  ALT 18  ALKPHOS 45  BILITOT 0.3  PROT 6.4*  ALBUMIN 3.9    Recent Labs  Lab 01/28/22 1330 01/28/22 1337 01/28/22 1747 01/29/22 0300 01/30/22 0143 01/31/22 0415 02/01/22 0700  WBC 4.7  --   --  8.6 5.3 5.2 4.8  NEUTROABS 2.5  --   --  6.1  --   --   --   HGB 12.0   < > 10.2* 11.2* 9.2* 9.3* 9.4*  HCT 35.5*   < > 30.0* 33.3* 27.3* 28.0* 28.1*  MCV 88.5  --   --  87.6 86.4 87.0 86.7  PLT 171  --   --  173 143* 157 178   < > = values in this interval not displayed.    No results for input(s): "CKTOTAL", "CKMB", "CKMBINDEX", "TROPONINI" in the last 168 hours. Recent Labs    01/30/22 1344 01/31/22 0415 02/01/22 0700  LABPROT 14.4 14.9 18.8*  INR 1.1 1.2 1.6*    No results for input(s): "COLORURINE", "LABSPEC", "PHURINE", "GLUCOSEU", "HGBUR", "BILIRUBINUR", "KETONESUR", "PROTEINUR", "UROBILINOGEN", "NITRITE", "LEUKOCYTESUR" in the last 72  hours.  Invalid input(s): "APPERANCEUR"      Component Value Date/Time   CHOL 223 (H) 01/22/2022 0351   TRIG 271 (H) 01/29/2022 0300   HDL 40 (L) 01/22/2022 0351   CHOLHDL 5.6 01/22/2022 0351   VLDL 33 01/22/2022 0351   LDLCALC 150 (H) 01/22/2022 0351   Lab Results  Component Value Date   HGBA1C 5.0 01/22/2022      Component Value Date/Time   LABOPIA NONE DETECTED 01/28/2022 1831   COCAINSCRNUR NONE DETECTED 01/28/2022 1831   LABBENZ NONE DETECTED 01/28/2022 1831   AMPHETMU NONE DETECTED 01/28/2022 1831   THCU POSITIVE (A) 01/28/2022 1831   LABBARB NONE DETECTED 01/28/2022 1831    Recent Labs  Lab 01/28/22 1330  ETH <10     I have personally reviewed the radiological images below and agree with the radiology interpretations.  DG Swallowing Func-Speech Pathology  Result Date: 01/30/2022 Table formatting from the original result was not included. Images from the original result were not included. Objective Swallowing Evaluation: Type of Study: MBS-Modified Barium Swallow Study  Patient Details Name: Chloe Johnson MRN: [id number] Date of  Birth: 02/05/1956 Today's Date: 01/30/2022 Time: SLP Start Time (ACUTE ONLY): 1135 -SLP Stop Time (ACUTE ONLY): 1200 SLP Time Calculation (min) (ACUTE ONLY): 25 min Past Medical History: Past Medical History: Diagnosis Date  Anxiety   Back pain   Discitis of lumbar region 11/16/2013  L3-4/notes 11/24/2013, arms, neck  Exertional asthma   GERD (gastroesophageal reflux disease)   Hypertension   Kidney stones   "have always passed them"  Neck pain   Osteomyelitis ([place]) 11/23/2013  osteomyelitis, discitis   Sleep concern   uses Prozac for sleep  Past Surgical History: Past Surgical History: Procedure Laterality Date  BREAST CYST EXCISION Left 2010  "polypectomy"  IR CT HEAD LTD  01/21/2022  IR CT HEAD LTD  01/28/2022  IR PERCUTANEOUS ART THROMBECTOMY/INFUSION INTRACRANIAL INC DIAG ANGIO  01/21/2022  IR PERCUTANEOUS ART THROMBECTOMY/INFUSION INTRACRANIAL INC DIAG  ANGIO  01/28/2022  IR US GUIDE VASC ACCESS RIGHT  01/22/2022  PICC LINE PLACE PERIPHERAL ([place] HX) Right   for use of Levaquin & Vancomycin, of note: she reports that she had a "flulike feeling"   RADIOLOGY WITH ANESTHESIA N/A 01/21/2022  Procedure: IR WITH ANESTHESIA;  Surgeon: Luanne Bras, MD;  Location: [place];  Service: Radiology;  Laterality: N/A;  RADIOLOGY WITH ANESTHESIA N/A 01/28/2022  Procedure: IR WITH ANESTHESIA;  Surgeon: Radiologist, Medication, MD;  Location: [place];  Service: Radiology;  Laterality: N/A;  TONSILLECTOMY AND ADENOIDECTOMY  1975  TUBAL LIGATION  1999 HPI: Patient is a 66 y.o. 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 On 01/25/22, patient began to have symptoms of dizziness followed by nausea and vomiting. Symptoms improved but then on 9/5 but she felt generally weak and was having trouble finding words. Husband observed right facial droop. She presented to [facility] ED as code stroke on 01/28/22; CT head without acute abnormality but CTA unable to be performed due to IV infiltration. She was taken directly to IR for thrombectomy and intubated for this procedure, extubated in morning of 01/29/22. MRI brain showed new acute infarcts in left posterior frontal and anterior parietal cortex.  Subjective: awake, alert, husband and two daughters present in room  Recommendations for follow up therapy are one component of a multi-disciplinary discharge planning process, led by the attending physician.  Recommendations may be updated based on patient status, additional functional criteria and insurance authorization. Assessment / Plan / Recommendation   01/30/2022   1:15 PM Clinical Impressions Clinical Impression Pt demonstrates oropharygneal dysphagia with mild oral impairment secondary to right CN VII and XII weakness and potential right sided  sensory changes. Pt has decreased labial seal on the right with anterior spillage, improved with cues to increase labial tension with straw or cup. There is also decreased bolus cohesion and mild lingual residue unless cued for an oral hold. Pt initiates swallow as the bolus arrives past the valleculae resulting in instances of flash penetration or sensed aspiration (consecutive straw sips). Pt improved with single sips and oral hold prior to swallow. There was questionable trace silent aspiration of light barium pooled in pyriforms; penetrated post swallow. However this finding is inconclusive and  likely negligible given pts sensation and ejection of mild aspirate in prior event. Recommend initiating nectar thick liquids and dys 3 solids to aid in pt relearning swallowing motor patterns with a slower, more manageable texture prior to advancement to  thin in 1-2 days if successful. 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);Dysphagia, unspecified (R13.10) Impact on safety and function Mild aspiration risk     01/30/2022   1:15 PM Treatment Recommendations Treatment Recommendations Therapy as outlined in treatment plan below     01/30/2022   1:15 PM Prognosis Prognosis for Safe Diet Advancement Good   01/30/2022   1:15 PM Diet Recommendations SLP Diet Recommendations Nectar thick liquid;Thin liquid;Dysphagia 3 (Mech soft) solids Liquid Administration via Cup;Straw Medication Administration Whole meds with puree Compensations Slow rate;Small sips/bites;Clear throat intermittently Postural Changes Remain semi-upright after after feeds/meals (Comment);Seated upright at 90 degrees     01/30/2022   1:15 PM Other Recommendations Oral Care Recommendations Oral care BID Other Recommendations Have oral suction available Follow Up Recommendations Acute inpatient rehab (3hours/day) Assistance recommended at discharge Frequent or constant Supervision/Assistance Functional Status Assessment Patient has had a recent decline in their functional  status and demonstrates the ability to make significant improvements in function in a reasonable and predictable amount of time.   01/30/2022   1:15 PM Frequency and Duration  Speech Therapy Frequency (ACUTE ONLY) min 2x/week Treatment Duration 2 weeks     01/30/2022   1:15 PM Oral Phase Oral Phase Impaired Oral - Thin Cup Decreased bolus cohesion Oral - Thin Straw Decreased bolus cohesion;Premature spillage;Lingual/palatal residue Oral - Puree WFL Oral - Regular Weak lingual manipulation;Lingual/palatal residue    01/30/2022   1:15 PM Pharyngeal Phase Pharyngeal Phase Impaired Pharyngeal- Nectar Cup Pharyngeal residue - pyriform Pharyngeal- Nectar Straw Pharyngeal residue - pyriform Pharyngeal- Thin Cup Pharyngeal residue - pyriform;Penetration/Aspiration during swallow;Penetration/Aspiration before swallow Pharyngeal Material enters airway, CONTACTS cords and not ejected out;Material enters airway, remains ABOVE vocal cords then ejected out;Material does not enter airway Pharyngeal- Thin Straw Delayed swallow initiation-pyriform sinuses;Penetration/Aspiration before swallow;Penetration/Apiration after swallow;Pharyngeal residue - pyriform;Trace aspiration Pharyngeal Material enters airway, passes BELOW cords then ejected out;Material enters airway, CONTACTS cords and not ejected out;Material does not enter airway     No data to display    Herbie Baltimore, MA CCC-SLP Acute Rehabilitation Services Secure Chat Preferred Office [phone] Lynann Beaver 01/30/2022, 1:48 PM                     IR PERCUTANEOUS ART THROMBECTOMY/INFUSION INTRACRANIAL INC DIAG ANGIO  Result Date: 01/30/2022 INDICATION: 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 EXAM: 1. EMERGENT LARGE VESSEL OCCLUSION THROMBOLYSIS (anterior CIRCULATION) COMPARISON:  None Available. MEDICATIONS: No antibiotic was administered within 1 hour of the procedure. ANESTHESIA/SEDATION: General anesthesia. CONTRAST:  Omnipaque 300 150 mL.  FLUOROSCOPY TIME:  Fluoroscopy Time: 39 minutes 12 seconds (1289 mGy). COMPLICATIONS: None immediate. TECHNIQUE: Following a full explanation of the procedure along with the potential associated complications, an informed witnessed consent was obtained. The risks of intracranial hemorrhage of 10%, worsening neurological deficit, ventilator dependency, death and inability to revascularize were all reviewed in detail with the patient's spouse. The patient was then put under general anesthesia by the Department of Anesthesiology at [facility]. The left groin was prepped and draped in the usual sterile fashion. Thereafter using modified Seldinger technique, transfemoral access into the left common femoral artery was obtained without difficulty. Over a 0.035 inch guidewire an 8 French 25 cm Pinnacle sheath was inserted. Through this, and also over a 0.035 inch guidewire a combination of a wrist Simmons 2 support catheter inside of an 087 balloon guide catheter was advanced to the aortic region, and selectively positioned initially in the left common carotid  artery and then advanced to the distal cervical left ICA over an 035 inch guidewire with support catheter. The guidewire was removed. Good aspiration obtained hub of the balloon guide catheter. Arteriogram was then performed proximally from the common carotid artery then the distal left internal carotid artery. FINDINGS: The left common carotid arteriogram demonstrates the left external carotid artery and its major branches to be widely patent. The left internal carotid artery at bulb to the cranial skull base demonstrates wide patency. Wide patency is noted of the petrous, the cavernous and the supraclinoid segments. Opacification of the left posterior cerebral artery via the left posterior communicating artery is noted. The left middle cerebral artery M1 segment is widely patent. The angiographic occlusion of the superior division mid M2 segment is noted.  The left MCA inferior division opacifies into the capillary and venous phases. The left anterior cerebral artery opacifies into the capillary and venous phases. Transient cross-filling via the anterior communicating artery of the right anterior cerebral artery A2 segment and the A1 segment is evident. PROCEDURE: Over an Aristotle softip micro guidewire with a J configuration, a combination of a 132 cm Zoom aspiration catheter inside of which was a 160 cm Phenom microcatheter was advanced to the proximal left middle cerebral artery. Using a torque device, access was obtained with the micro guidewire through the occluded superior division into the proximal M3 segment followed by the microcatheter. The guidewire was removed. Good aspiration was obtained from the hub of the microcatheter. A gentle contrast injection demonstrated slow antegrade flow of contrast distally. A 3 mm x 36 mm retrieval device was then advanced to the distal end of the microcatheter and deployed in the usual manner. At this time the Zoom aspiration catheter was advanced into the proximal superior division. With proximal flow arrest in the left internal carotid artery constant aspiration was applied at the hub of the Zoom aspiration catheter and the aspiration device 2 minutes of the balloon guide catheter with a 20 mL syringe. Combination of the retrieval, the catheter and the Zoom aspiration catheter was retrieved removed. Following reversal of flow arrest, control arteriogram with the balloon guide catheter in the left internal carotid artery demonstrated near complete opacification of the superior division with gradual improved flow into the M3 M4 segment of the superior division. Moderate spasm noted in the proximal superior division responded to 5 aliquotes of 25 mcg of nitroglycerin intra-arterially. Also noted was a small non flow limiting filling defect in a mid parietal branch of the inferior division in the distal M3 M4 region which  also gradually started clearing over a period of 20 minutes. Control arteriogram performed through the balloon guide catheter in the mid cervical left demonstrated near complete revascularization of the left MCA achieving a TICI 2C revascularization. A control arteriogram performed through the left common carotid artery demonstrated small dissection of the distal left internal carotid artery at the skull base medially without any flow limitation. The balloon guide catheter was removed. Diagnostic catheter arteriogram was then performed of the right common carotid artery and the right vertebral artery. These demonstrated the right external carotid artery and its major branches to be widely patent. Internal carotid artery at the bulb to the cranial skull base demonstrates wide patency. The petrous, the cavernous and the supraclinoid segments demonstrate wide patency as do the right middle cerebral artery and the right anterior cerebral artery into the capillary and venous phases. The right vertebral artery demonstrates wide patency. More distally, patency is maintained  of the basilar junction in the right posterior-inferior cerebellar artery. The basilar artery, the posterior cerebral arteries, the superior cerebellar arteries and the anterior-inferior cerebellar arteries demonstrate patency into the capillary and venous phases. Unopacified blood was noted in the basilar artery from the contralateral left vertebral artery. An 8 French Angio-Seal closure device demonstrates hemostasis at the left groin puncture site. Distal pulses remained palpable in both feet unchanged. A CT of the brain demonstrated no hemorrhagic complications. The patient was left intubated due to poor responsiveness following reversal of the anesthesia. Patient was then transferred to the neuro ICU post revascularization care. IMPRESSION: Status post endovascular revascularization of occluded division of the left middle cerebral artery achieving  a TICI 2C revascularization of the left MCA distribution. Puncture to first pass approximately 36 mins. PLAN: Follow-up as per referring MD. Electronically Signed   By: Luanne Bras M.D.   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 EXAM: 1. EMERGENT LARGE VESSEL OCCLUSION THROMBOLYSIS (anterior CIRCULATION) COMPARISON:  None Available. MEDICATIONS: No antibiotic was administered within 1 hour of the procedure. ANESTHESIA/SEDATION: General anesthesia. CONTRAST:  Omnipaque 300 150 mL. FLUOROSCOPY TIME:  Fluoroscopy Time: 39 minutes 12 seconds (1289 mGy). COMPLICATIONS: None immediate. TECHNIQUE: Following a full explanation of the procedure along with the potential associated complications, an informed witnessed consent was obtained. The risks of intracranial hemorrhage of 10%, worsening neurological deficit, ventilator dependency, death and inability to revascularize were all reviewed in detail with the patient's spouse. The patient was then put under general anesthesia by the Department of Anesthesiology at [facility]. The left groin was prepped and draped in the usual sterile fashion. Thereafter using modified Seldinger technique, transfemoral access into the left common femoral artery was obtained without difficulty. Over a 0.035 inch guidewire an 8 French 25 cm Pinnacle sheath was inserted. Through this, and also over a 0.035 inch guidewire a combination of a wrist Simmons 2 support catheter inside of an 087 balloon guide catheter was advanced to the aortic region, and selectively positioned initially in the left common carotid artery and then advanced to the distal cervical left ICA over an 035 inch guidewire with support catheter. The guidewire was removed. Good aspiration obtained hub of the balloon guide catheter. Arteriogram was then performed proximally from the common carotid artery then the distal left  internal carotid artery. FINDINGS: The left common carotid arteriogram demonstrates the left external carotid artery and its major branches to be widely patent. The left internal carotid artery at bulb to the cranial skull base demonstrates wide patency. Wide patency is noted of the petrous, the cavernous and the supraclinoid segments. Opacification of the left posterior cerebral artery via the left posterior communicating artery is noted. The left middle cerebral artery M1 segment is widely patent. The angiographic occlusion of the superior division mid M2 segment is noted. The left MCA inferior division opacifies into the capillary and venous phases. The left anterior cerebral artery opacifies into the capillary and venous phases. Transient cross-filling via the anterior communicating artery of the right anterior cerebral artery A2 segment and the A1 segment is evident. PROCEDURE: Over an Aristotle softip micro guidewire with a J configuration, a combination of a 132 cm Zoom aspiration catheter inside of which was a 160 cm Phenom microcatheter was advanced to the proximal left middle cerebral artery. Using a torque device, access was obtained with the micro guidewire through the occluded superior  division into the proximal M3 segment followed by the microcatheter. The guidewire was removed. Good aspiration was obtained from the hub of the microcatheter. A gentle contrast injection demonstrated slow antegrade flow of contrast distally. A 3 mm x 36 mm retrieval device was then advanced to the distal end of the microcatheter and deployed in the usual manner. At this time the Zoom aspiration catheter was advanced into the proximal superior division. With proximal flow arrest in the left internal carotid artery constant aspiration was applied at the hub of the Zoom aspiration catheter and the aspiration device 2 minutes of the balloon guide catheter with a 20 mL syringe. Combination of the retrieval, the catheter and  the Zoom aspiration catheter was retrieved removed. Following reversal of flow arrest, control arteriogram with the balloon guide catheter in the left internal carotid artery demonstrated near complete opacification of the superior division with gradual improved flow into the M3 M4 segment of the superior division. Moderate spasm noted in the proximal superior division responded to 5 aliquotes of 25 mcg of nitroglycerin intra-arterially. Also noted was a small non flow limiting filling defect in a mid parietal branch of the inferior division in the distal M3 M4 region which also gradually started clearing over a period of 20 minutes. Control arteriogram performed through the balloon guide catheter in the mid cervical left demonstrated near complete revascularization of the left MCA achieving a TICI 2C revascularization. A control arteriogram performed through the left common carotid artery demonstrated small dissection of the distal left internal carotid artery at the skull base medially without any flow limitation. The balloon guide catheter was removed. Diagnostic catheter arteriogram was then performed of the right common carotid artery and the right vertebral artery. These demonstrated the right external carotid artery and its major branches to be widely patent. Internal carotid artery at the bulb to the cranial skull base demonstrates wide patency. The petrous, the cavernous and the supraclinoid segments demonstrate wide patency as do the right middle cerebral artery and the right anterior cerebral artery into the capillary and venous phases. The right vertebral artery demonstrates wide patency. More distally, patency is maintained of the basilar junction in the right posterior-inferior cerebellar artery. The basilar artery, the posterior cerebral arteries, the superior cerebellar arteries and the anterior-inferior cerebellar arteries demonstrate patency into the capillary and venous phases. Unopacified blood  was noted in the basilar artery from the contralateral left vertebral artery. An 8 French Angio-Seal closure device demonstrates hemostasis at the left groin puncture site. Distal pulses remained palpable in both feet unchanged. A CT of the brain demonstrated no hemorrhagic complications. The patient was left intubated due to poor responsiveness following reversal of the anesthesia. Patient was then transferred to the neuro ICU post revascularization care. IMPRESSION: Status post endovascular revascularization of occluded division of the left middle cerebral artery achieving a TICI 2C revascularization of the left MCA distribution. Puncture to first pass approximately 36 mins. PLAN: Follow-up as per referring MD. Electronically Signed   By: Luanne Bras M.D.   On: 01/30/2022 08:42   MR BRAIN WO CONTRAST  Result Date: 01/28/2022 CLINICAL DATA:  Right MCA stroke status post thrombectomy, follow-up EXAM: MRI HEAD WITHOUT CONTRAST MRA HEAD WITHOUT CONTRAST TECHNIQUE: Multiplanar, multi-echo pulse sequences of the brain and surrounding structures were acquired without intravenous contrast. Angiographic images of the Circle of Willis were acquired using MRA technique without intravenous contrast. COMPARISON:  01/22/2022 MRI head, no prior MRA, correlation is made with CTA head and neck  01/21/2022 FINDINGS: Brain: New foci of restricted diffusion with ADC correlate in the left posterior frontal and anterior parietal cortex (series 5, images 80 [id number]). Redemonstrated focus of restricted diffusion with ADC correlate in the left insula (series 5, image 76 and series 6, image 26). Other previously noted foci of restricted diffusion are decreased in conspicuity and do not have persistent ADC correlates. No acute hemorrhage, mass, mass effect, or midline shift. No hydrocephalus or extra-axial collection. Punctate focus of hemosiderin deposition in the left posterior frontal lobe. Minimal T2 hyperintense signal in the  periventricular Grattan matter and pons, likely the sequela of mild chronic small vessel ischemic disease. Vascular: Normal arterial flow voids. Skull and upper cervical spine: Normal marrow signal. Sinuses/Orbits: Minimal mucosal thickening in the ethmoid air cells. The orbits are unremarkable. Other: The mastoids are well aerated. MRA HEAD FINDINGS Anterior circulation: Both internal carotid arteries are patent to the termini, without significant stenosis. A1 segments patent. Normal anterior communicating artery. Anterior cerebral arteries are patent to their distal aspects. No M1 stenosis or occlusion. Distal MCA branches perfused and symmetric.In particular, the left MCA branches appear perfused and without focal stenosis. Posterior circulation: Vertebral arteries patent to the vertebrobasilar junction without stenosis. Basilar patent to its distal aspect. Superior cerebellar arteries patent bilaterally. Patent P1 segments. PCAs perfused to their distal aspects without stenosis. Diminutive left posterior communicating artery. The right posterior communicating artery is not definitively visualized. Anatomic variants: None significant IMPRESSION: 1. 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 2. No intracranial large vessel occlusion or significant stenosis. In particular, left MCA branches appear perfused and without focal stenosis. These results will be called to the ordering clinician or representative by the Radiologist Assistant, and communication documented in the PACS or [employer]. Electronically Signed   By: Merilyn Baba M.D.   On: 01/28/2022 23:40   MR ANGIO HEAD WO CONTRAST  Result Date: 01/28/2022 CLINICAL DATA:  Right MCA stroke status post thrombectomy, follow-up EXAM: MRI HEAD WITHOUT CONTRAST MRA HEAD WITHOUT CONTRAST TECHNIQUE: Multiplanar, multi-echo pulse sequences of the brain and surrounding structures were acquired without intravenous contrast.  Angiographic images of the Circle of Willis were acquired using MRA technique without intravenous contrast. COMPARISON:  01/22/2022 MRI head, no prior MRA, correlation is made with CTA head and neck 01/21/2022 FINDINGS: Brain: New foci of restricted diffusion with ADC correlate in the left posterior frontal and anterior parietal cortex (series 5, images 80 8-95). Redemonstrated focus of restricted diffusion with ADC correlate in the left insula (series 5, image 76 and series 6, image 26). Other previously noted foci of restricted diffusion are decreased in conspicuity and do not have persistent ADC correlates. No acute hemorrhage, mass, mass effect, or midline shift. No hydrocephalus or extra-axial collection. Punctate focus of hemosiderin deposition in the left posterior frontal lobe. Minimal T2 hyperintense signal in the periventricular [place] matter and pons, likely the sequela of mild chronic small vessel ischemic disease. Vascular: Normal arterial flow voids. Skull and upper cervical spine: Normal marrow signal. Sinuses/Orbits: Minimal mucosal thickening in the ethmoid air cells. The orbits are unremarkable. Other: The mastoids are well aerated. MRA HEAD FINDINGS Anterior circulation: Both internal carotid arteries are patent to the termini, without significant stenosis. A1 segments patent. Normal anterior communicating artery. Anterior cerebral arteries are patent to their distal aspects. No M1 stenosis or occlusion. Distal MCA branches perfused and symmetric.In particular, the left MCA branches appear perfused and without focal stenosis. Posterior circulation: Vertebral arteries  patent to the vertebrobasilar junction without stenosis. Basilar patent to its distal aspect. Superior cerebellar arteries patent bilaterally. Patent P1 segments. PCAs perfused to their distal aspects without stenosis. Diminutive left posterior communicating artery. The right posterior communicating artery is not definitively  visualized. Anatomic variants: None significant IMPRESSION: 1. 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 2. No intracranial large vessel occlusion or significant stenosis. In particular, left MCA branches appear perfused and without focal stenosis. These results will be called to the ordering clinician or representative by the Radiologist Assistant, and communication documented in the PACS or [employer]. Electronically Signed   By: Merilyn Baba M.D.   On: 01/28/2022 23:40   [place] EKG SITE RITE  Result Date: 01/28/2022 If Site Rite image not attached, placement could not be confirmed due to current cardiac rhythm.  DG Abd 1 View  Result Date: 01/28/2022 CLINICAL DATA:  Evaluate OG tube placement EXAM: ABDOMEN - 1 VIEW COMPARISON:  None Available. FINDINGS: The OG tube terminates in the left upper quadrant, in the region of the stomach. IMPRESSION: The OG tube appears to terminate in the stomach. No other abnormalities. Electronically Signed   By: Dorise Bullion III M.D.   On: 01/28/2022 17:28   Portable Chest x-ray  Result Date: 01/28/2022 CLINICAL DATA:  [id number]. Encounter for ETT placement. EXAM: PORTABLE CHEST 1 VIEW COMPARISON:  Chest x-ray 01/26/2022, CT heart 01/23/2022 FINDINGS: Enteric tube with tip terminating 3 cm above the carina. Slightly more prominent aortic arch and aortic knob likely due to positioning and AP portable technique. Otherwise the heart and mediastinal contours are unchanged normal limits. No focal consolidation. No pulmonary edema. No pleural effusion. No pneumothorax. No acute osseous abnormality. IMPRESSION: 1. Slightly more prominent aortic arch and aortic knob likely due to positioning and AP portable technique. Finding can likely be further evaluated on CT angio head and neck 01/28/2022. 2. No active disease. Electronically Signed   By: Iven Finn M.D.   On: 01/28/2022 17:21   CT HEAD CODE STROKE WO CONTRAST  Result Date:  01/28/2022 CLINICAL DATA:  Code stroke.  Neuro deficit, acute, stroke suspected EXAM: CT HEAD WITHOUT CONTRAST TECHNIQUE: Contiguous axial images were obtained from the base of the skull through the vertex without intravenous contrast. RADIATION DOSE REDUCTION: This exam was performed according to the departmental dose-optimization program which includes automated exposure control, adjustment of the mA and/or kV according to patient size and/or use of iterative reconstruction technique. COMPARISON:  CT head 01/21/2022. FINDINGS: Brain: No evidence of acute large vascular territory infarction, hemorrhage, hydrocephalus, extra-axial collection or mass lesion/mass effect. Vascular: No hyperdense vessel identified. Skull: No acute fracture. Sinuses/Orbits: Clear sinuses.  No acute orbital findings. Other: No mastoid effusions. ASPECTS [facility] Stroke Program Early CT Score) total score (0-10 with 10 being normal): 10. IMPRESSION: 1. No evidence of acute intracranial abnormality. 2. ASPECTS is 10. Code stroke imaging results were communicated on 01/28/2022 at 1:43 pm to provider Bhagat via secure text paging. Electronically Signed   By: Margaretha Sheffield M.D.   On: 01/28/2022 13:43   DG Chest 2 View  Result Date: 01/26/2022 CLINICAL DATA:  Chest and shoulder pain.  Recent MRI and CVA. EXAM: CHEST - 2 VIEW COMPARISON:  11/21/2021 FINDINGS: The lungs are clear without focal pneumonia, edema, pneumothorax or pleural effusion. The cardiopericardial silhouette is within normal limits for size. The visualized bony structures of the thorax are unremarkable. IMPRESSION: No active cardiopulmonary disease. Electronically Signed  By: Misty Stanley M.D.   On: 01/26/2022 12:03   CT CORONARY MORPH W/CTA COR W/SCORE W/CA W/CM &/OR WO/CM  Addendum Date: 01/23/2022   ADDENDUM REPORT: 01/23/2022 13:25 EXAM: OVER-READ INTERPRETATION  CT CHEST The following report is a limited chest CT over-read performed by radiologist Dr. Lindaann Slough [facility] on 01/23/2022. This over-read does not include interpretation of cardiac or coronary anatomy or pathology. The coronary CTA interpretation by the cardiologist is attached. COMPARISON:  Radiographs 11/21/2021. Coronary artery calcium score CT 10/04/2020 FINDINGS: Mediastinum/Nodes: No enlarged lymph nodes within the visualized mediastinum. Lungs/Pleura: There is no pleural effusion. The visualized lungs appear clear. Upper abdomen: No significant findings within the visualized upper abdomen. Musculoskeletal/Chest wall: No chest wall mass or suspicious osseous findings within the visualized chest. IMPRESSION: No significant extracardiac findings within the visualized chest. Electronically Signed   By: Richardean Sale M.D.   On: 01/23/2022 13:25   Result Date: 01/23/2022 HISTORY: Chest pain/anginal equiv, ECGs or troponins abnormal EXAM: Cardiac/Coronary CT TECHNIQUE: The patient was scanned on a [employer]. PROTOCOL: A 100 kV prospective scan was triggered in the descending thoracic aorta at 111 HU's. Axial non-contrast 3 mm slices were carried out through the heart. The data set was analyzed on a dedicated work station and scored using the Agatston method. Gantry rotation speed was 250 msecs and collimation was .6 mm. Heart rate was optimized medically and sl NTG was given. The 3D data set was reconstructed in 5% intervals of the 35-75 % of the R-R cycle. Systolic and diastolic phases were analyzed on a dedicated work station using MPR, MIP and VRT modes. The patient received contrast. FINDINGS: Coronary calcium score: The patient's coronary artery calcium score is 0, which places the patient in the 0 percentile. Coronary arteries: Normal coronary origins.  Right dominance. Right Coronary Artery: Normal caliber vessel, gives rise to small PDA. No significant plaque or stenosis. Left Main Coronary Artery: Normal caliber vessel. No significant plaque or stenosis. Small  caliber ramus without significant plaque or stenosis. Left Anterior Descending Coronary Artery: Normal caliber vessel. Minimal noncalcified plaque with 1-24% stenosis. Gives rise to large first diagonal branch without significant stenosis. Distal LAD wraps apex Left Circumflex Artery: Normal caliber vessel. Very trivial noncalcified plaque without significant stenosis. Gives rise to large first and second, small third OM branches. Aorta: Normal size, 31 mm at the mid ascending aorta (level of the PA bifurcation) measured double oblique. Trivial aortic atherosclerosis. No dissection seen in visualized portions of the aorta. Aortic Valve: No calcifications. Trileaflet. Other findings: Normal pulmonary vein drainage into the left atrium. Normal left atrial appendage without a thrombus. Appendage is chicken wing type. Normal size of the pulmonary artery. Normal appearance of the pericardium. There is an apical LV thrombus, dimensions saves on PACS images. Maximum dimension 15.6 x 11.3 mm There is a small accessory left atrial segment on the cranial aspect of the left atrium. IMPRESSION: 1.  Very minimal nonobstructive CAD, CADRADS = 1. 2. Coronary calcium score of 0. This was 0 percentile for age and sex matched control. 3. Normal coronary origin with right dominance. 4. There is an apical LV thrombus, dimensions saves on PACS images. Maximum dimension 15.6 x 11.3 mm Findings reported to ordering provider. INTERPRETATION: CAD-RADS 1: Minimal non-obstructive CAD (0-24%). Consider non-atherosclerotic causes of chest pain. Consider preventive therapy and risk factor modification. Electronically Signed: By: Buford Dresser M.D. 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 She initially presented on 01/21/2022 with transient episode of aphasia followed sudden onset dizziness, nausea, vomiting, chest pain that radiated to upper back on Saturday. MRI brain showed small ischemic infarct of the frontal [place] matter on the left. Her troponins were elevated to over 300 and her echocardiogram showed apical hypokinesis and a left ventricular thrombus consistent with NSTEMI. Around 6 p.m. on 01/21/2022, patient developed worsening aphasia, NIHSS 6. Head CT was unremarkable. CT angiogram of the head and neck showed a left M2/M3-MCA occlusion. She was then transferred to our service for an emergency mechanical thrombectomy. EXAM: ULTRASOUND-GUIDED VASCULAR ACCESS DIAGNOSTIC CEREBRAL ANGIOGRAM MECHANICAL THROMBECTOMY FLAT PANEL HEAD CT COMPARISON:  CT/CT angiogram of the head and neck January 22, 2019 MEDICATIONS: No antibiotic was administered. ANESTHESIA/SEDATION: The procedure was performed under general anesthesia. CONTRAST:  75 mL of Omnipaque 300 milligram/mL. FLUOROSCOPY: Radiation Exposure Index (as provided by the fluoroscopic device): 401.0 mGy Kerma COMPLICATIONS: None immediate. TECHNIQUE: Informed written consent was obtained from the patient after a thorough discussion of the procedural risks, benefits and alternatives. All questions were addressed. Maximal Sterile Barrier Technique was utilized including caps, mask, sterile gowns, sterile gloves, sterile drape, hand hygiene and skin antiseptic. A timeout was performed prior to the initiation of the procedure. The right groin was prepped and draped in the usual sterile fashion. Using a micropuncture kit and the modified Seldinger technique, access was gained to the right common femoral artery and an 8 French sheath was placed. Real-time ultrasound guidance was utilized for vascular access including the acquisition  of a permanent ultrasound image documenting patency of the accessed vessel. Under fluoroscopy, a Zoom 88 guide catheter was navigated over a 6 [place] VTK catheter and a 0.035" Terumo Glidewire into the aortic arch. The catheter was placed into the left common carotid artery and then advanced into the left internal carotid artery. The diagnostic catheter was removed. Frontal and lateral angiograms of the head were obtained. FINDINGS: 1. Normal caliber of the right common femoral artery, adequate for vascular access. 2. There is no occlusion of the distal left M2/MCA superior division branch extending to the M3 segment. PROCEDURE: Using biplane roadmap, a Vect 46 aspiration catheter was navigated over an Aristotle 24 microguidewire into the cavernous segment of the left ICA. The aspiration catheter was then advanced over the wire to the level of occlusion in the M2-M3 segment and connected to an aspiration pump. Continuous aspiration was performed for 2 minutes. The guide catheter was connected to a VacLok syringe. The aspiration catheter was subsequently removed under constant aspiration. The guide catheter was aspirated for debris. Left internal carotid artery angiograms with magnified frontal and lateral views of the head showed recanalization of the left MCA with slow flow in a few distal cortical branches (TICI 2C). No embolus to new territory. Flat panel CT of the head was obtained and post processed in a separate workstation with concurrent attending physician supervision. Selected images were sent to PACS. No evidence of hemorrhagic complication. Delayed left internal carotid artery angiogram showed persistent patency of the left MCA. Right common femoral artery angiogram was obtained in right anterior oblique view. The puncture is at the level of the common femoral artery. The artery has normal caliber, adequate for closure device. The sheath was  exchanged over the wire for a Perclose prostyle which was  utilized for access closure. Immediate hemostasis was achieved. IMPRESSION: 1. Successful mechanical thrombectomy for treatment of a distal left M2/MCA occlusion with direct contact aspiration achieving complete recanalization (TICI 2C). 2. No thromboembolic or hemorrhagic complication. PLAN: Transfer to ICU for continued post stroke care. Electronically Signed   By: Pedro Earls M.D.   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 She initially presented on 01/21/2022 with transient episode of aphasia followed sudden onset dizziness, nausea, vomiting, chest pain that radiated to upper back on Saturday. MRI brain showed small ischemic infarct of the frontal [place] matter on the left. Her troponins were elevated to over 300 and her echocardiogram showed apical hypokinesis and a left ventricular thrombus consistent with NSTEMI. Around 6 p.m. on 01/21/2022, patient developed worsening aphasia, NIHSS 6. Head CT was unremarkable. CT angiogram of the head and neck showed a left M2/M3-MCA occlusion. She was then transferred to our service for an emergency mechanical thrombectomy. EXAM: ULTRASOUND-GUIDED VASCULAR ACCESS DIAGNOSTIC CEREBRAL ANGIOGRAM MECHANICAL THROMBECTOMY FLAT PANEL HEAD CT COMPARISON:  CT/CT angiogram of the head and neck January 22, 2019 MEDICATIONS: No antibiotic was administered. ANESTHESIA/SEDATION: The procedure was performed under general anesthesia. CONTRAST:  75 mL of Omnipaque 300 milligram/mL. FLUOROSCOPY: Radiation Exposure Index (as provided by the fluoroscopic device): 536.1 mGy Kerma COMPLICATIONS: None immediate. TECHNIQUE: Informed written consent was obtained from  the patient after a thorough discussion of the procedural risks, benefits and alternatives. All questions were addressed. Maximal Sterile Barrier Technique was utilized including caps, mask, sterile gowns, sterile gloves, sterile drape, hand hygiene and skin antiseptic. A timeout was performed prior to the initiation of the procedure. The right groin was prepped and draped in the usual sterile fashion. Using a micropuncture kit and the modified Seldinger technique, access was gained to the right common femoral artery and an 8 French sheath was placed. Real-time ultrasound guidance was utilized for vascular access including the acquisition of a permanent ultrasound image documenting patency of the accessed vessel. Under fluoroscopy, a Zoom 88 guide catheter was navigated over a 6 [place] VTK catheter and a 0.035" Terumo Glidewire into the aortic arch. The catheter was placed into the left common carotid artery and then advanced into the left internal carotid artery. The diagnostic catheter was removed. Frontal and lateral angiograms of the head were obtained. FINDINGS: 1. Normal caliber of the right common femoral artery, adequate for vascular access. 2. There is no occlusion of the distal left M2/MCA superior division branch extending to the M3 segment. PROCEDURE: Using biplane roadmap, a Vect 46 aspiration catheter was navigated over an Aristotle 24 microguidewire into the cavernous segment of the left ICA. The aspiration catheter was then advanced over the wire to the level of occlusion in the M2-M3 segment and connected to an aspiration pump. Continuous aspiration was performed for 2 minutes. The guide catheter was connected to a VacLok syringe. The aspiration catheter was subsequently removed under constant aspiration. The guide catheter was aspirated for debris. Left internal carotid artery angiograms with magnified frontal and lateral views of the head showed recanalization of the left MCA with slow flow in a  few distal cortical branches (TICI 2C). No embolus to new territory. Flat panel  CT of the head was obtained and post processed in a separate workstation with concurrent attending physician supervision. Selected images were sent to PACS. No evidence of hemorrhagic complication. Delayed left internal carotid artery angiogram showed persistent patency of the left MCA. Right common femoral artery angiogram was obtained in right anterior oblique view. The puncture is at the level of the common femoral artery. The artery has normal caliber, adequate for closure device. The sheath was exchanged over the wire for a Perclose prostyle which was utilized for access closure. Immediate hemostasis was achieved. IMPRESSION: 1. Successful mechanical thrombectomy for treatment of a distal left M2/MCA occlusion with direct contact aspiration achieving complete recanalization (TICI 2C). 2. No thromboembolic or hemorrhagic complication. PLAN: Transfer to ICU for continued post stroke care. Electronically Signed   By: Pedro Earls M.D.   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 She initially presented on 01/21/2022 with transient episode of aphasia followed sudden onset dizziness, nausea, vomiting, chest pain that radiated to upper back on Saturday. MRI brain showed small ischemic infarct of the frontal [place] matter on the left. Her troponins were elevated to over 300 and her echocardiogram showed apical hypokinesis and a left ventricular thrombus consistent with NSTEMI. Around 6 p.m. on 01/21/2022, patient developed worsening aphasia, NIHSS 6. Head CT was unremarkable. CT angiogram of the  head and neck showed a left M2/M3-MCA occlusion. She was then transferred to our service for an emergency mechanical thrombectomy. EXAM: ULTRASOUND-GUIDED VASCULAR ACCESS DIAGNOSTIC CEREBRAL ANGIOGRAM MECHANICAL THROMBECTOMY FLAT PANEL HEAD CT COMPARISON:  CT/CT angiogram of the head and neck January 22, 2019 MEDICATIONS: No antibiotic was administered. ANESTHESIA/SEDATION: The procedure was performed under general anesthesia. CONTRAST:  75 mL of Omnipaque 300 milligram/mL. FLUOROSCOPY: Radiation Exposure Index (as provided by the fluoroscopic device): 829.9 mGy Kerma COMPLICATIONS: None immediate. TECHNIQUE: Informed written consent was obtained from the patient after a thorough discussion of the procedural risks, benefits and alternatives. All questions were addressed. Maximal Sterile Barrier Technique was utilized including caps, mask, sterile gowns, sterile gloves, sterile drape, hand hygiene and skin antiseptic. A timeout was performed prior to the initiation of the procedure. The right groin was prepped and draped in the usual sterile fashion. Using a micropuncture kit and the modified Seldinger technique, access was gained to the right common femoral artery and an 8 French sheath was placed. Real-time ultrasound guidance was utilized for vascular access including the acquisition of a permanent ultrasound image documenting patency of the accessed vessel. Under fluoroscopy, a Zoom 88 guide catheter was navigated over a 6 [place] VTK catheter and a 0.035" Terumo Glidewire into the aortic arch. The catheter was placed into the left common carotid artery and then advanced into the left internal carotid artery. The diagnostic catheter was removed. Frontal and lateral angiograms of the head were obtained. FINDINGS: 1. Normal caliber of the right common femoral artery, adequate for vascular access. 2. There is no occlusion of the distal left M2/MCA superior division branch extending to the M3 segment. PROCEDURE: Using  biplane roadmap, a Vect 46 aspiration catheter was navigated over an Aristotle 24 microguidewire into the cavernous segment of the left ICA. The aspiration catheter was then advanced over the wire to the level of occlusion in the  M2-M3 segment and connected to an aspiration pump. Continuous aspiration was performed for 2 minutes. The guide catheter was connected to a VacLok syringe. The aspiration catheter was subsequently removed under constant aspiration. The guide catheter was aspirated for debris. Left internal carotid artery angiograms with magnified frontal and lateral views of the head showed recanalization of the left MCA with slow flow in a few distal cortical branches (TICI 2C). No embolus to new territory. Flat panel CT of the head was obtained and post processed in a separate workstation with concurrent attending physician supervision. Selected images were sent to PACS. No evidence of hemorrhagic complication. Delayed left internal carotid artery angiogram showed persistent patency of the left MCA. Right common femoral artery angiogram was obtained in right anterior oblique view. The puncture is at the level of the common femoral artery. The artery has normal caliber, adequate for closure device. The sheath was exchanged over the wire for a Perclose prostyle which was utilized for access closure. Immediate hemostasis was achieved. IMPRESSION: 1. Successful mechanical thrombectomy for treatment of a distal left M2/MCA occlusion with direct contact aspiration achieving complete recanalization (TICI 2C). 2. No thromboembolic or hemorrhagic complication. PLAN: Transfer to ICU for continued post stroke care. Electronically Signed   By: Pedro Earls M.D.   On: 01/22/2022 14:25   MR BRAIN WO CONTRAST  Result Date: 01/22/2022 CLINICAL DATA:  Follow-up stroke EXAM: MRI HEAD WITHOUT CONTRAST TECHNIQUE: Multiplanar, multiecho pulse sequences of the brain and surrounding structures were obtained  without intravenous contrast. COMPARISON:  Brain MRI and CT/CTA head and neck 1 day prior FINDINGS: Brain: A punctate fusion of diffusion restriction in the left frontal lobe periventricular Schueller matter is unchanged compared to the study from 1 day prior. Additional small foci of diffusion restriction in the left external capsule, insula, and frontal lobe cortex are new. There is no associated hemorrhage or mass effect. Parenchymal volume is normal. The ventricles are normal in size. Parenchymal signal is otherwise normal, with no significant burden of under vitamin chronic small vessel ischemic change. There is no mass lesion.  There is no mass effect or midline shift. Vascular: Normal flow voids. Skull and upper cervical spine: Normal marrow signal. Sinuses/Orbits: The paranasal sinuses are clear. The globes and orbits are unremarkable. Other: None. IMPRESSION: New punctate acute infarcts in the left external capsule, insula, and frontal lobe cortex, and unchanged additional punctate infarct in the left frontal lobe Folkerts matter. Electronically Signed   By: Valetta Mole M.D.   On: 01/22/2022 11:39   CT CEREBRAL PERFUSION W CONTRAST  Result Date: 01/21/2022 CLINICAL DATA:  Neuro deficit, acute, stroke suspected. EXAM: CT ANGIOGRAPHY HEAD AND NECK CT PERFUSION BRAIN TECHNIQUE: Multidetector CT imaging of the head and neck was performed using the standard protocol during bolus administration of intravenous contrast. Multiplanar CT image reconstructions and MIPs were obtained to evaluate the vascular anatomy. Carotid stenosis measurements (when applicable) are obtained utilizing NASCET criteria, using the distal internal carotid diameter as the denominator. Multiphase CT imaging of the brain was performed following IV bolus contrast injection. Subsequent parametric perfusion maps were calculated using RAPID software. RADIATION DOSE REDUCTION: This exam was performed according to the departmental dose-optimization  program which includes automated exposure control, adjustment of the mA and/or kV according to patient size and/or use of iterative reconstruction technique. CONTRAST:  173m OMNIPAQUE IOHEXOL 350 MG/ML SOLN COMPARISON:  Brain MRI 01/21/2022. Noncontrast head CT performed earlier today 01/21/2022. FINDINGS: CTA NECK FINDINGS Aortic arch: Standard  aortic branching. Mild atherosclerotic plaque within the visualized aortic arch and proximal major branch vessels of the neck. Streak and beam hardening artifact arising from a dense right-sided contrast bolus partially obscures the right subclavian artery. Within this limitation, there is no appreciable hemodynamically significant innominate or proximal subclavian artery stenosis. Right carotid system: CCA and ICA patent within the neck without stenosis. Mild atherosclerotic plaque about the carotid bifurcation. Left carotid system: CCA and ICA patent within the neck without stenosis. Minimal atherosclerotic plaque about the carotid bifurcation. Vertebral arteries: Vertebral arteries codominant and patent within the neck without stenosis. Nonstenotic atherosclerotic plaque at the origin of the right vertebral artery. Skeleton: Cervical spondylosis. No acute fracture or aggressive osseous lesion. Other neck: No neck mass or cervical lymphadenopathy. Upper chest: No consolidation within the imaged lung apices. Review of the MIP images confirms the above findings CTA HEAD FINDINGS Anterior circulation: The intracranial internal carotid arteries are patent. The M1 middle cerebral arteries are patent. Occluded mid M2 left MCA vessel (with distal reconstitution, likely due to collateral flow) (for instance as seen on series 15, image 15). The anterior cerebral arteries are patent. No intracranial aneurysm is identified. Posterior circulation: The intracranial vertebral arteries are patent. The basilar artery is patent. The posterior cerebral arteries are patent. Posterior  communicating arteries are diminutive or absent, bilaterally. Venous sinuses: Within the limitations of contrast timing, no convincing thrombus. Anatomic variants: As described. Review of the MIP images confirms the above findings CT Brain Perfusion Findings: CBF (<30%) Volume: 83m Perfusion (Tmax>6.0s) volume: 738mMismatch Volume: 75m51mnfarction Location:None identified CTA head impression and CT perfusion head impression called by telephone at the time of interpretation on 01/21/2022 at 6:50 pm to provider MC[facility]ILL [facility]who verbally acknowledged these results. IMPRESSION: CTA neck: 1. The common carotid, internal carotid and vertebral arteries are patent within the neck without stenosis. Minimal non-stenotic atherosclerotic plaque about the carotid bifurcations and at the origin of the right vertebral artery. 2.  Aortic Atherosclerosis (ICD10-I70.0). CTA head: Occluded mid M2 left MCA vessel (with distal reconstitution, likely due to collateral flow). CT perfusion head: The perfusion software identifies a 7 mL region of critically hypoperfused parenchyma within the left insula/frontal lobe (utilizing the Tmax>6 seconds threshold). No core infarct is identified. Reported mismatch volume: 7 mL. Electronically Signed   By: KylKellie SimmeringO.   On: 01/21/2022 19:09   CT ANGIO HEAD NECK W WO CM (CODE STROKE)  Result Date: 01/21/2022 CLINICAL DATA:  Neuro deficit, acute, stroke suspected. EXAM: CT ANGIOGRAPHY HEAD AND NECK CT PERFUSION BRAIN TECHNIQUE: Multidetector CT imaging of the head and neck was performed using the standard protocol during bolus administration of intravenous contrast. Multiplanar CT image reconstructions and MIPs were obtained to evaluate the vascular anatomy. Carotid stenosis measurements (when applicable) are obtained utilizing NASCET criteria, using the distal internal carotid diameter as the denominator. Multiphase CT imaging of the brain was performed following IV bolus contrast  injection. Subsequent parametric perfusion maps were calculated using RAPID software. RADIATION DOSE REDUCTION: This exam was performed according to the departmental dose-optimization program which includes automated exposure control, adjustment of the mA and/or kV according to patient size and/or use of iterative reconstruction technique. CONTRAST:  1075m37mNIPAQUE IOHEXOL 350 MG/ML SOLN COMPARISON:  Brain MRI 01/21/2022. Noncontrast head CT performed earlier today 01/21/2022. FINDINGS: CTA NECK FINDINGS Aortic arch: Standard aortic branching. Mild atherosclerotic plaque within the visualized aortic arch and proximal major branch vessels of the neck. Streak and beam hardening artifact  arising from a dense right-sided contrast bolus partially obscures the right subclavian artery. Within this limitation, there is no appreciable hemodynamically significant innominate or proximal subclavian artery stenosis. Right carotid system: CCA and ICA patent within the neck without stenosis. Mild atherosclerotic plaque about the carotid bifurcation. Left carotid system: CCA and ICA patent within the neck without stenosis. Minimal atherosclerotic plaque about the carotid bifurcation. Vertebral arteries: Vertebral arteries codominant and patent within the neck without stenosis. Nonstenotic atherosclerotic plaque at the origin of the right vertebral artery. Skeleton: Cervical spondylosis. No acute fracture or aggressive osseous lesion. Other neck: No neck mass or cervical lymphadenopathy. Upper chest: No consolidation within the imaged lung apices. Review of the MIP images confirms the above findings CTA HEAD FINDINGS Anterior circulation: The intracranial internal carotid arteries are patent. The M1 middle cerebral arteries are patent. Occluded mid M2 left MCA vessel (with distal reconstitution, likely due to collateral flow) (for instance as seen on series 15, image 15). The anterior cerebral arteries are patent. No intracranial  aneurysm is identified. Posterior circulation: The intracranial vertebral arteries are patent. The basilar artery is patent. The posterior cerebral arteries are patent. Posterior communicating arteries are diminutive or absent, bilaterally. Venous sinuses: Within the limitations of contrast timing, no convincing thrombus. Anatomic variants: As described. Review of the MIP images confirms the above findings CT Brain Perfusion Findings: CBF (<30%) Volume: 72m Perfusion (Tmax>6.0s) volume: 777mMismatch Volume: 80m11mnfarction Location:None identified CTA head impression and CT perfusion head impression called by telephone at the time of interpretation on 01/21/2022 at 6:50 pm to provider MC[facility]ILL [facility]IRH[facility] LLCwho verbally acknowledged these results. IMPRESSION: CTA neck: 1. The common carotid, internal carotid and vertebral arteries are patent within the neck without stenosis. Minimal non-stenotic atherosclerotic plaque about the carotid bifurcations and at the origin of the right vertebral artery. 2.  Aortic Atherosclerosis (ICD10-I70.0). CTA head: Occluded mid M2 left MCA vessel (with distal reconstitution, likely due to collateral flow). CT perfusion head: The perfusion software identifies a 7 mL region of critically hypoperfused parenchyma within the left insula/frontal lobe (utilizing the Tmax>6 seconds threshold). No core infarct is identified. Reported mismatch volume: 7 mL. Electronically Signed   By: KylKellie SimmeringO.   On: 01/21/2022 19:09   CT HEAD CODE STROKE WO CONTRAST  Result Date: 01/21/2022 CLINICAL DATA:  Code stroke. Neuro deficit, acute, stroke suspected. EXAM: CT HEAD WITHOUT CONTRAST TECHNIQUE: Contiguous axial images were obtained from the base of the skull through the vertex without intravenous contrast. RADIATION DOSE REDUCTION: This exam was performed according to the departmental dose-optimization program which includes automated exposure control, adjustment of the mA and/or kV according to  patient size and/or use of iterative reconstruction technique. COMPARISON:  Same-day brain MRI 01/21/2022. FINDINGS: Brain: No age advanced or lobar predominant parenchymal atrophy. A subcentimeter acute infarct within the left frontal lobe Hearne matter (adjacent to the left lateral ventricle frontal horn) is occult by CT and was better appreciated on the brain MRI performed earlier today. No CT evidence of interval acute infarct. Minimal chronic small-vessel image changes within the cerebral Kurek matter and pons, better appreciated on the prior brain MRI. There is no acute intracranial hemorrhage. No extra-axial fluid collection. No evidence of an intracranial mass. No midline shift. Vascular: No hyperdense vessel. Skull: No fracture or aggressive osseous lesion. Sinuses/Orbits: No mass or acute finding within the imaged orbits. Trace mucosal thickening within the anterior left ethmoid air cells. ASPECTS ([facility] Program Early CT Score) - Ganglionic  level infarction (caudate, lentiform nuclei, internal capsule, insula, M1-M3 cortex): 7 - Supraganglionic infarction (M4-M6 cortex): 3 Total score (0-10 with 10 being normal): 10 These results were communicated to Dr. Leonel Ramsay at 6:27 pmon 8/29/2023by text page via the [facility] messaging system. IMPRESSION: A known subcentimeter acute infarct within the left frontal lobe Mcphie matter is occult by CT, and was better appreciated on the brain MRI performed earlier today. No CT evidence of interval acute intracranial abnormality. Minimal chronic small-vessel ischemic changes within the cerebral Tayloe matter and pons. Electronically Signed   By: Kellie Simmering D.O.   On: 01/21/2022 18:27   ECHOCARDIOGRAM COMPLETE  Result Date: 01/21/2022    ECHOCARDIOGRAM REPORT   Patient Name:   Chloe Johnson Date of Exam: 01/21/2022 Medical Rec #:  [id number]    Height:       61.0 in Accession #:    [id number]   Weight:       171.0 lb Date of Birth:  [birth year]    BSA:          1.767  m Patient Age:    65 years     BP:           146/125 mmHg Patient Gender: F            HR:           72 bpm. Exam Location:  Inpatient Procedure: 2D Echo, Color Doppler and Cardiac Doppler Indications:    Stroke i63.9  History:        Patient has no prior history of Echocardiogram examinations.                 Risk Factors:Hypertension.  Sonographer:    Raquel Sarna Senior RDCS Referring Phys: [place]  1. Left ventricular ejection fraction, by estimation, is 45 to 50%. The left ventricle has mildly decreased function. The left ventricle demonstrates regional wall motion abnormalities (apical hypokinesis with out true aneurysm and with presence of an LV thrombus 1.84 X 0.9 X 1.2 cm).  2. The mitral valve is grossly normal. No evidence of mitral valve regurgitation. No evidence of mitral stenosis.  3. The aortic valve is tricuspid. Aortic valve regurgitation is not visualized. No aortic stenosis is present.  4. Right ventricular systolic function is normal. The right ventricular size is normal. Tricuspid regurgitation signal is inadequate for assessing PA pressure.  5. The inferior vena cava is normal in size with greater than 50% respiratory variability, suggesting right atrial pressure of 3 mmHg. Comparison(s): No prior Echocardiogram. 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 Cardiology aware. FINDINGS  Left Ventricle: Left ventricular ejection fraction, by estimation, is 45 to 50%. The left ventricle has mildly decreased function. The left ventricle demonstrates regional wall motion abnormalities. The left ventricular internal cavity size was small. There is no left ventricular hypertrophy. Left ventricular diastolic parameters are consistent with Grade I diastolic dysfunction (impaired relaxation).  LV Wall Scoring: The apical lateral segment, apical septal segment, apical anterior segment, and apical inferior segment are hypokinetic. Right Ventricle: The  right ventricular size is normal. No increase in right ventricular wall thickness. Right ventricular systolic function is normal. Tricuspid regurgitation signal is inadequate for assessing PA pressure. Left Atrium: Left atrial size was normal in size. Right Atrium: Right atrial size was normal in size. Pericardium: There is no evidence of pericardial effusion. Mitral Valve: The mitral valve is grossly normal. No evidence of mitral valve regurgitation. No evidence of mitral  valve stenosis. Tricuspid Valve: The tricuspid valve is normal in structure. Tricuspid valve regurgitation is not demonstrated. No evidence of tricuspid stenosis. Aortic Valve: The aortic valve is tricuspid. Aortic valve regurgitation is not visualized. No aortic stenosis is present. Pulmonic Valve: The pulmonic valve was normal in structure. Pulmonic valve regurgitation is not visualized. No evidence of pulmonic stenosis. Aorta: The aortic root and ascending aorta are structurally normal, with no evidence of dilitation. Venous: The inferior vena cava is normal in size with greater than 50% respiratory variability, suggesting right atrial pressure of 3 mmHg. IAS/Shunts: No atrial level shunt detected by color flow Doppler.  LEFT VENTRICLE PLAX 2D LVIDd:         3.60 cm     Diastology LVIDs:         2.50 cm     LV e' medial:    6.68 cm/s LV PW:         0.80 cm     LV E/e' medial:  6.5 LV IVS:        0.80 cm     LV e' lateral:   11.00 cm/s LVOT diam:     1.70 cm     LV E/e' lateral: 3.9 LV SV:         42 LV SV Index:   24 LVOT Area:     2.27 cm  LV Volumes (MOD) LV vol d, MOD A2C: 70.1 ml LV vol d, MOD A4C: 46.6 ml LV vol s, MOD A2C: 37.3 ml LV vol s, MOD A4C: 23.3 ml LV SV MOD A2C:     32.8 ml LV SV MOD A4C:     46.6 ml LV SV MOD BP:      28.7 ml RIGHT VENTRICLE RV S prime:     9.32 cm/s TAPSE (M-mode): 1.7 cm LEFT ATRIUM             Index        RIGHT ATRIUM          Index LA diam:        2.70 cm 1.53 cm/m   RA Area:     9.52 cm LA Vol (A2C):    28.1 ml 15.90 ml/m  RA Volume:   20.50 ml 11.60 ml/m LA Vol (A4C):   26.3 ml 14.88 ml/m LA Biplane Vol: 28.8 ml 16.30 ml/m  AORTIC VALVE LVOT Vmax:   115.00 cm/s LVOT Vmean:  68.700 cm/s LVOT VTI:    0.185 m  AORTA Ao Root diam: 2.90 cm Ao Asc diam:  3.20 cm MITRAL VALVE MV Area (PHT): 3.11 cm    SHUNTS MV Decel Time: 244 msec    Systemic VTI:  0.18 m MV E velocity: 43.20 cm/s  Systemic Diam: 1.70 cm MV A velocity: 64.50 cm/s MV E/A ratio:  0.67 Rudean Haskell MD Electronically signed by Rudean Haskell MD Signature Date/Time: 01/21/2022/4:04:02 PM    Final    MR BRAIN WO CONTRAST  Result Date: 01/21/2022 CLINICAL DATA:  TIA.  Sudden onset of aphasia, now resolved. EXAM: MRI HEAD WITHOUT CONTRAST TECHNIQUE: Multiplanar, multiecho pulse sequences of the brain and surrounding structures were obtained without intravenous contrast. COMPARISON:  None Available. FINDINGS: Brain: Small acute infarct in the left frontal Orsak matter. No hemorrhage, hydrocephalus, mass, or atrophy. Minimal chronic small vessel changes may be present in the pons. No prior infarct is seen. Vascular: Major flow voids are preserved Skull and upper cervical spine: Normal marrow signal. Degenerative facet spurring where covered in  the cervical spine with mild C2-3 anterolisthesis. Sinuses/Orbits: Negative IMPRESSION: Small acute infarct in the left frontal Krasowski matter. Electronically Signed   By: Jorje Guild M.D.   On: 01/21/2022 12:32     PHYSICAL EXAM  Temp:  [98.3 F (36.8 C)-98.8 F (37.1 C)] 98.8 F (37.1 C) (09/09 1212) Pulse Rate:  [82-92] 92 (09/09 1212) Resp:  [16-17] 16 (09/09 1212) BP: (104-131)/(63-85) 107/67 (09/09 1212) SpO2:  [98 %-100 %] 98 % (09/09 1212)  General - Well nourished, well developed, in no apparent distress. Sitting up in bed.  Neuro - awake, alert, eyes open, expressive aphasia, able to make sounds out but not meaningful words, following simple command..  Not able to name and  repeat. No gaze palsy, tracking bilaterally, blinking to visual threat bilaterally.  Right mild facial droop. Tongue protrusion difficulty. LUE 5/5, no drift, RUE 4-/5 proximal and 0/5 distally. Bilaterally LEs 5/5, no drift. Sensation symmetrical bilaterally subjectively, left FTN intact, gait not tested.    ASSESSMENT/PLAN Ms. ARELLY WHITTENBERG is a 65 y.o. 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 No tPA given due to on Eliquis and recent stroke.    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 INR goal 2.5 Continue ASA 81 per cardiology. Ongoing aggressive stroke risk factor management Therapy recommendations:  CIR Disposition:  pending  Hx of stroke and LV thrombus 8/29 admitted for nausea vomiting dizziness chest pain and then developed right facial droop and aphasia.  Found to have troponin > 300, EF 45 to 50% with LV thrombus.  CTA coronary artery showed minimal CAD.  CT no acute abnormality.  CTA head and neck showed left M2 occlusion.  CTP 0/7.  Status post EVT with TICI2c.  MRI showed left frontal Brook matter punctate infarct.  Repeat MRI showed left external capsule,  insular cortex and frontal cortex punctate infarcts.  LDL 150, A1c 5.0.  UDS positive for THC.  Patient was put on heparin IV and aspirin and eventually discharged on Eliquis and aspirin as well as Crestor 20. Per family, patient has slight expressive aphasia since discharge, no physical deficit. Cardiology Dr. Johnsie Cancel on board, recommend coumadin with INR goal 2.5. on lovenox bridge.   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  Lupus - ANA and ds DNA negative  Hospital day # 4    02/01/2022 12:54 PM

## 2022-02-01 NOTE — Progress Notes (Signed)
No changes in hematoma site at 2257

## 2022-02-01 NOTE — Progress Notes (Signed)
Left vascular site holding pressure from 2130-2200. Patient tolerated well, hematoma size remains unchanged. Pure wick applied for urination. Patient and husband educated and understand to lay flat and no flexion at hip for the remaining of night. Will continue to monitor site.

## 2022-02-01 NOTE — Progress Notes (Signed)
   02/01/22 2049  Provider Notification  Provider Name/Title Dr. Caryl Pina  Date Provider Notified 02/01/22  Time Provider Notified 2035  Method of Notification Page  Notification Reason Change in status  Test performed and critical result homatoma got bigger  Date Critical Result Received 02/01/22 (NA)  Time Critical Result Received  (assessed on 9/9 at 2015)  Provider response Evaluate remotely;Other (Comment) (per MD to consult another MD)  Date of Provider Response 02/01/22  Time of Provider Response 2050   Patient self reporting that her L femoral vascular site has gotten bigger. The site is marked now at 4*2 cm and is hard to palate tender with pain level at 5/10. The surrounding the skin with large bruises. Positive femoral pulse and weak pedal pulse. New orders received per E. Otelia Limes

## 2022-02-02 ENCOUNTER — Inpatient Hospital Stay (HOSPITAL_COMMUNITY): Payer: Federal, State, Local not specified - PPO

## 2022-02-02 DIAGNOSIS — I639 Cerebral infarction, unspecified: Secondary | ICD-10-CM | POA: Diagnosis not present

## 2022-02-02 DIAGNOSIS — M7981 Nontraumatic hematoma of soft tissue: Secondary | ICD-10-CM | POA: Diagnosis not present

## 2022-02-02 LAB — BASIC METABOLIC PANEL
Anion gap: 8 (ref 5–15)
BUN: 6 mg/dL — ABNORMAL LOW (ref 8–23)
CO2: 25 mmol/L (ref 22–32)
Calcium: 8.8 mg/dL — ABNORMAL LOW (ref 8.9–10.3)
Chloride: 109 mmol/L (ref 98–111)
Creatinine, Ser: 0.57 mg/dL (ref 0.44–1.00)
GFR, Estimated: >60 mL/min (ref >60)
Glucose, Bld: 112 mg/dL — ABNORMAL HIGH (ref 70–99)
Potassium: 3.4 mmol/L — ABNORMAL LOW (ref 3.5–5.1)
Sodium: 142 mmol/L (ref 135–145)

## 2022-02-02 LAB — URINALYSIS, ROUTINE W REFLEX MICROSCOPIC
Bilirubin Urine: NEGATIVE
Glucose, UA: NEGATIVE mg/dL
Hgb urine dipstick: NEGATIVE
Ketones, ur: NEGATIVE mg/dL
Nitrite: POSITIVE — AB
Protein, ur: 30 mg/dL — AB
Specific Gravity, Urine: 1.02 (ref 1.005–1.030)
pH: 7 (ref 5.0–8.0)

## 2022-02-02 LAB — CBC
HCT: 27.9 % — ABNORMAL LOW (ref 36.0–46.0)
Hemoglobin: 9.5 g/dL — ABNORMAL LOW (ref 12.0–15.0)
MCH: 29.3 pg (ref 26.0–34.0)
MCHC: 34.1 g/dL (ref 30.0–36.0)
MCV: 86.1 fL (ref 80.0–100.0)
Platelets: 183 10*3/uL (ref 150–400)
RBC: 3.24 MIL/uL — ABNORMAL LOW (ref 3.87–5.11)
RDW: 12 % (ref 11.5–15.5)
WBC: 4.5 10*3/uL (ref 4.0–10.5)
nRBC: 0 % (ref 0.0–0.2)

## 2022-02-02 LAB — HEPARIN ANTI-XA: Heparin LMW: 1 IU/mL

## 2022-02-02 LAB — PROTIME-INR
INR: 1.7 — ABNORMAL HIGH (ref 0.8–1.2)
Prothrombin Time: 19.6 seconds — ABNORMAL HIGH (ref 11.4–15.2)

## 2022-02-02 MED ORDER — POTASSIUM CHLORIDE 20 MEQ PO PACK
20.0000 meq | PACK | Freq: Once | ORAL | Status: AC
Start: 2022-02-02 — End: 2022-02-02
  Administered 2022-02-02: 20 meq via ORAL
  Filled 2022-02-02: qty 1

## 2022-02-02 MED ORDER — WARFARIN SODIUM 7.5 MG PO TABS
7.5000 mg | ORAL_TABLET | Freq: Once | ORAL | Status: AC
  Administered 2022-02-02: 7.5 mg via ORAL
  Filled 2022-02-02: qty 1

## 2022-02-02 MED ORDER — FERROUS SULFATE 325 (65 FE) MG PO TABS
325.0000 mg | ORAL_TABLET | Freq: Every day | ORAL | Status: DC
  Administered 2022-02-03 – 2022-02-04 (×2): 325 mg via ORAL
  Filled 2022-02-02 (×2): qty 1

## 2022-02-02 NOTE — Progress Notes (Addendum)
ANTICOAGULATION CONSULT NOTE  Pharmacy Consult for Enoxaparin and Warfarin Indication: LV thrombus  Allergies  Allergen Reactions   Doxycycline Nausea Only    malaise    Erythromycin Nausea And Vomiting   Iodine     unknown   Latex Other (See Comments)    Irritation of the skin   Shellfish Allergy Other (See Comments)    GI- Facial  Acne   Strawberry Extract Other (See Comments)    Mouth ulcers    Vancomycin    Vicodin [Hydrocodone-Acetaminophen] Other (See Comments)    Restless, tolerate percocet   Dilaudid [Hydromorphone Hcl] Rash    *Pt states she can take if given benadryl prior*   Keflex [Cephalexin] Rash   Penicillins Rash    Hives   Sulfa Antibiotics Rash    Hives    Patient Measurements: Height: 5\' 1"  (154.9 cm) Weight: 59 kg (130 lb 1.1 oz) IBW/kg (Calculated) : 47.8  Vital Signs: Temp: 97.7 F (36.5 C) (09/10 0725) Temp Source: Oral (09/10 0725) BP: 107/71 (09/10 0725) Pulse Rate: 75 (09/10 0725)  Labs: Recent Labs    01/30/22 1000 01/30/22 1344 01/31/22 0415 02/01/22 0700 02/02/22 0445  HGB  --    < > 9.3* 9.4* 9.5*  HCT  --   --  28.0* 28.1* 27.9*  PLT  --   --  157 178 183  APTT 72*  --   --   --   --   LABPROT  --    < > 14.9 18.8* 19.6*  INR  --    < > 1.2 1.6* 1.7*  HEPARINUNFRC 0.52  --   --   --   --   CREATININE  --    < > 0.61 0.66 0.57   < > = values in this interval not displayed.     Estimated Creatinine Clearance: 58.7 mL/min (by C-G formula based on SCr of 0.57 mg/dL).   Medical History: 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/16/2013   L3-4/notes 11/24/2013, arms, neck   Exertional asthma    GERD (gastroesophageal reflux disease)    Hypertension    Kidney stones    "have always passed them"   Neck pain    Osteomyelitis (HCC) 11/23/2013   osteomyelitis, discitis    Sleep concern    uses Prozac for sleep     Medications:  Medications Prior to Admission   Medication Sig Dispense Refill Last Dose   ALPRAZolam (XANAX) 0.5 MG tablet Take 0.5 mg by mouth at bedtime.   01/27/2022   apixaban (ELIQUIS) 5 MG TABS tablet Take 1 tablet (5 mg total) by mouth 2 (two) times daily. 60 tablet 1 01/28/2022 at 0800   cyclobenzaprine (FLEXERIL) 10 MG tablet Take 1 tablet (10 mg total) by mouth 3 (three) times daily as needed for muscle spasms. 90 tablet 5 01/27/2022   guaiFENesin (MUCINEX) 600 MG 12 hr tablet Take 600 mg by mouth daily as needed for cough or to loosen phlegm.   Past Month   loratadine (CLARITIN) 10 MG tablet Take 10 mg by mouth every morning.   01/28/2022   MYRBETRIQ 25 MG TB24 tablet Take 25 mg by mouth daily.   01/28/2022   pregabalin (LYRICA) 25 MG capsule Take 1 capsule (25 mg total) by mouth 2 (two) times daily. 60 capsule 0 01/28/2022   rosuvastatin (CRESTOR) 20 MG tablet Take 1 tablet (20 mg total)  by mouth daily. 30 tablet 1 01/28/2022   sacubitril-valsartan (ENTRESTO) 24-26 MG Take 1 tablet by mouth 2 (two) times daily. 60 tablet 1 01/28/2022   Scheduled:   ALPRAZolam  0.5 mg Oral QHS   aspirin EC  81 mg Oral Daily   Chlorhexidine Gluconate Cloth  6 each Topical Daily   docusate  100 mg Oral BID   enoxaparin (LOVENOX) injection  60 mg Subcutaneous Q12H   loratadine  10 mg Oral Daily   polyethylene glycol  17 g Oral Daily   pregabalin  25 mg Oral BID   rosuvastatin  20 mg Oral Daily   sacubitril-valsartan  1 tablet Oral BID   sodium chloride flush  10-40 mL Intracatheter Q12H   Warfarin - Pharmacist Dosing Inpatient   Does not apply q1600    Assessment: 43 yoF with admitted for stroke s/p thrombectomy on 9/5. 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  Readmitted with recurrent CVA after 7 days of eliquis with compliance. Last Eliquis dose 9/5 @0800 .  Currently on warfarin while bridging with enoxaparin SQ. Pharmacy consulted 01/30/22 to assist with dosing.  INR 9/10 is 1.7, subtherapeutic after warfarin started 01/30/22 with minimal increase. CrCl stable  at 50-60 mL/min. CBC stable with hematoma enlargement noted overnight on 9/10 at L femoral vascular access-site with increased bruising and tenderness. MD was notified and advised RN to hold pressure on the site and no further enlargement was noted.   Goal of Therapy:  INR 2.5-3  (2.5 per Dr. 11/10) LMWH anti-Xa level goal 0.6-1 units/mL (BID regimen)  Monitor platelets by anticoagulation protocol: Yes   Plan:  Continue Enoxaparin 60mg  SQ BID  F/U anti-Xa level to assess if LMWH is supratherapeutic  Warfarin 7.5 mg PO today x1 Check INR , CBC daily  F/U hematoma size  Cardiologist noted target INR 2.5 and he plans for patient follow up at  coumadin clinic at [facility].    ADDENDUM: VAS groin pseudoaneurysm with results c/w hematoma. Anti-xa level therapeutic at 1 unit/mL, drawn appropriately four hours after lovenox dose.   - continue with plan as stated above  - if hematoma continues to worsen, could consider holding lovenox   [facility], PharmD PGY-2 Infectious Diseases Resident  02/02/2022 7:28 AM

## 2022-02-02 NOTE — Progress Notes (Addendum)
[name] has reviewed the vascular ultrasound images. Patient does not have a pseudoaneurysm, just a hematoma. IR would like for patient to remain bedrest with bathroom privileges (with staff assistance). IR will re-assess in the morning. The RN was notified via Epic Chat.   Please call IR with any questions.  Alwyn Ren, [place] [phone] 02/02/2022, 11:37 AM

## 2022-02-02 NOTE — Progress Notes (Signed)
OT Cancellation Note  Patient Details Name: SHAHRZAD KOBLE MRN: [id number] DOB: 1955/10/22   Cancelled Treatment:    Reason Eval/Treat Not Completed: Active bedrest order (Attempted to see pt for updated CIR note; pt now on bedrest with bathroom privileges (with staff assistance) due to hematoma)  Bama Hanselman A Jasilyn Holderman 02/02/2022, 3:02 PM

## 2022-02-02 NOTE — Progress Notes (Signed)
Referring Physician(s): Dr. Otelia Limes  Supervising Physician: Julieanne Cotton  Patient Status:  [facility] - In-pt  Chief Complaint: Recurrent left M2/ MCA occlusion, patient was found to have a LV thrombus on 8/29  S/p M2/MCA thrombectomy on 8/29 by Dr. Tommie Sams S/p M2/MCA thrombectomy on 9/5 by [name]. Patient now with possible hematoma versus pseudoaneurysm  Subjective: Patient in bed with her significant other at the bedside. Left groin with diffuse mottled bruising/pain.   Allergies: Doxycycline, Erythromycin, Iodine, Latex, Shellfish allergy, Strawberry extract, Vancomycin, Vicodin [hydrocodone-acetaminophen], Dilaudid [hydromorphone hcl], Keflex [cephalexin], Penicillins, and Sulfa antibiotics  Medications: Prior to Admission medications   Medication Sig Start Date End Date Taking? Authorizing Provider  ALPRAZolam Prudy Feeler) 0.5 MG tablet Take 0.5 mg by mouth at bedtime. 12/23/21  Yes [provider]  apixaban (ELIQUIS) 5 MG TABS tablet Take 1 tablet (5 mg total) by mouth 2 (two) times daily. 01/23/22  Yes Karie Fetch, MD  cyclobenzaprine (FLEXERIL) 10 MG tablet Take 1 tablet (10 mg total) by mouth 3 (three) times daily as needed for muscle spasms. 11/17/14  Yes Tia Alert, MD  guaiFENesin (MUCINEX) 600 MG 12 hr tablet Take 600 mg by mouth daily as needed for cough or to loosen phlegm.   Yes [provider]  loratadine (CLARITIN) 10 MG tablet Take 10 mg by mouth every morning.   Yes [provider]  MYRBETRIQ 25 MG TB24 tablet Take 25 mg by mouth daily. 12/31/21  Yes [provider]  pregabalin (LYRICA) 25 MG capsule Take 1 capsule (25 mg total) by mouth 2 (two) times daily. 01/26/22 02/25/22 Yes Eber Hong, MD  rosuvastatin (CRESTOR) 20 MG tablet Take 1 tablet (20 mg total) by mouth daily. 01/24/22  Yes Karie Fetch, MD  sacubitril-valsartan (ENTRESTO) 24-26 MG Take 1 tablet by mouth 2 (two) times daily. 01/23/22  Yes Karie Fetch, MD     Vital Signs: BP 107/71 (BP Location: Right Arm)   Pulse 75   Temp 97.7 F (36.5 C) (Oral)   Resp 16   Ht 5\' 1"  (1.549 m)   Wt 130 lb 1.1 oz (59 kg)   SpO2 99%   BMI 24.58 kg/m   Physical Exam Constitutional:      General: She is not in acute distress.    Appearance: She is not ill-appearing.  Cardiovascular:     Comments: Left groin with diffuse, mottled bruising. This has been outlined with sharpie. The puncture site has a firm, tender nodule. Site is otherwise clean and dry.  Pulmonary:     Effort: Pulmonary effort is normal.  Skin:    General: Skin is warm and dry.  Neurological:     Mental Status: She is alert and oriented to person, place, and time.     Comments: Able to follow all commands and appears to fully comprehend everything being discussed. She has apraxia of speech but was able to point to the letter board to spell out words.       Imaging: DG Swallowing Func-Speech Pathology  Result Date: 01/30/2022 Table formatting from the original result was not included. Images from the original result were not included. Objective Swallowing Evaluation: Type of Study: MBS-Modified Barium Swallow Study  Patient Details Name: Chloe Johnson MRN: Jacinta Shoe Date of Birth: 04-Dec-1955 Today's Date: 01/30/2022 Time: SLP Start Time (ACUTE ONLY): 1135 -SLP Stop Time (ACUTE ONLY): 1200 SLP Time Calculation (min) (ACUTE ONLY): 25 min Past Medical History: Past Medical History: Diagnosis Date  Anxiety   Back pain   Discitis of lumbar region 11/16/2013  L3-4/notes 11/24/2013, arms, neck  Exertional asthma   GERD (gastroesophageal reflux disease)   Hypertension   Kidney stones   "have always passed them"  Neck pain   Osteomyelitis (HCC) 11/23/2013  osteomyelitis, discitis   Sleep concern   uses Prozac for sleep  Past Surgical History: Past Surgical History: Procedure Laterality Date  BREAST CYST EXCISION Left 2010  "polypectomy"  IR CT HEAD LTD  01/21/2022  IR CT HEAD LTD  01/28/2022   IR PERCUTANEOUS ART THROMBECTOMY/INFUSION INTRACRANIAL INC DIAG ANGIO  01/21/2022  IR PERCUTANEOUS ART THROMBECTOMY/INFUSION INTRACRANIAL INC DIAG ANGIO  01/28/2022  IR US GUIDE VASC ACCESS RIGHT  01/22/2022  PICC LINE PLACE PERIPHERAL ([facility] HX) Right   for use of Levaquin & Vancomycin, of note: she reports that she had a "flulike feeling"   RADIOLOGY WITH ANESTHESIA N/A 01/21/2022  Procedure: IR WITH ANESTHESIA;  Surgeon: Julieanne Cotton, MD;  Location: [facility] OR;  Service: Radiology;  Laterality: N/A;  RADIOLOGY WITH ANESTHESIA N/A 01/28/2022  Procedure: IR WITH ANESTHESIA;  Surgeon: Radiologist, Medication, MD;  Location: [facility] OR;  Service: Radiology;  Laterality: N/A;  TONSILLECTOMY AND ADENOIDECTOMY  1975  TUBAL LIGATION  1999 HPI: Patient is a 66 y.o. 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 On 01/25/22, patient began to have symptoms of dizziness followed by nausea and vomiting. Symptoms improved but then on 9/5 but she felt generally weak and was having trouble finding words. Husband observed right facial droop. She presented to [facility] ED as code stroke on 01/28/22; CT head without acute abnormality but CTA unable to be performed due to IV infiltration. She was taken directly to IR for thrombectomy and intubated for this procedure, extubated in morning of 01/29/22. MRI brain showed new acute infarcts in left posterior frontal and anterior parietal cortex.  Subjective: awake, alert, husband and two daughters present in room  Recommendations for follow up therapy are one component of a multi-disciplinary discharge planning process, led by the attending physician.  Recommendations may be updated based on patient status, additional functional criteria and insurance authorization. Assessment / Plan / Recommendation   01/30/2022   1:15 PM Clinical Impressions Clinical  Impression Pt demonstrates oropharygneal dysphagia with mild oral impairment secondary to right CN VII and XII weakness and potential right sided sensory changes. Pt has decreased labial seal on the right with anterior spillage, improved with cues to increase labial tension with straw or cup. There is also decreased bolus cohesion and mild lingual residue unless cued for an oral hold. Pt initiates swallow as the bolus arrives past the valleculae resulting in instances of flash penetration or sensed aspiration (consecutive straw sips). Pt improved with single sips and oral hold prior to swallow. There was questionable trace silent aspiration of light barium pooled in pyriforms; penetrated post swallow. However this finding is inconclusive and  likely negligible given pts sensation and ejection of mild aspirate in prior event. Recommend initiating nectar thick liquids and dys 3 solids to aid in pt relearning swallowing motor patterns with a slower, more manageable texture prior to advancement to thin in 1-2 days if successful. 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);Dysphagia, unspecified (R13.10) Impact on safety and function Mild aspiration risk     01/30/2022   1:15 PM Treatment Recommendations Treatment Recommendations Therapy  as outlined in treatment plan below     01/30/2022   1:15 PM Prognosis Prognosis for Safe Diet Advancement Good   01/30/2022   1:15 PM Diet Recommendations SLP Diet Recommendations Nectar thick liquid;Thin liquid;Dysphagia 3 (Mech soft) solids Liquid Administration via Cup;Straw Medication Administration Whole meds with puree Compensations Slow rate;Small sips/bites;Clear throat intermittently Postural Changes Remain semi-upright after after feeds/meals (Comment);Seated upright at 90 degrees     01/30/2022   1:15 PM Other Recommendations Oral Care Recommendations Oral care BID Other Recommendations Have oral suction available Follow Up Recommendations Acute inpatient rehab (3hours/day) Assistance  recommended at discharge Frequent or constant Supervision/Assistance Functional Status Assessment Patient has had a recent decline in their functional status and demonstrates the ability to make significant improvements in function in a reasonable and predictable amount of time.   01/30/2022   1:15 PM Frequency and Duration  Speech Therapy Frequency (ACUTE ONLY) min 2x/week Treatment Duration 2 weeks     01/30/2022   1:15 PM Oral Phase Oral Phase Impaired Oral - Thin Cup Decreased bolus cohesion Oral - Thin Straw Decreased bolus cohesion;Premature spillage;Lingual/palatal residue Oral - Puree WFL Oral - Regular Weak lingual manipulation;Lingual/palatal residue    01/30/2022   1:15 PM Pharyngeal Phase Pharyngeal Phase Impaired Pharyngeal- Nectar Cup Pharyngeal residue - pyriform Pharyngeal- Nectar Straw Pharyngeal residue - pyriform Pharyngeal- Thin Cup Pharyngeal residue - pyriform;Penetration/Aspiration during swallow;Penetration/Aspiration before swallow Pharyngeal Material enters airway, CONTACTS cords and not ejected out;Material enters airway, remains ABOVE vocal cords then ejected out;Material does not enter airway Pharyngeal- Thin Straw Delayed swallow initiation-pyriform sinuses;Penetration/Aspiration before swallow;Penetration/Apiration after swallow;Pharyngeal residue - pyriform;Trace aspiration Pharyngeal Material enters airway, passes BELOW cords then ejected out;Material enters airway, CONTACTS cords and not ejected out;Material does not enter airway     No data to display    Harlon Ditty, MA CCC-SLP Acute Rehabilitation Services Secure Chat Preferred Office [phone] Claudine Mouton 01/30/2022, 1:48 PM                      Labs:  CBC: Recent Labs    01/30/22 0143 01/31/22 0415 02/01/22 0700 02/02/22 0445  WBC 5.3 5.2 4.8 4.5  HGB 9.2* 9.3* 9.4* 9.5*  HCT 27.3* 28.0* 28.1* 27.9*  PLT 143* 157 178 183    COAGS: Recent Labs    01/29/22 1025 01/29/22 1745 01/30/22 0143  01/30/22 1000 01/30/22 1344 01/31/22 0415 02/01/22 0700 02/02/22 0445  INR  --   --   --   --  1.1 1.2 1.6* 1.7*  APTT 39* 58* 77* 72*  --   --   --   --     BMP: Recent Labs    01/30/22 1620 01/31/22 0415 02/01/22 0700 02/02/22 0445  NA 138 139 141 142  K 3.5 3.1* 3.4* 3.4*  CL 109 109 110 109  CO2 22 24 25 25   GLUCOSE 98 92 109* 112*  BUN <5* <5* <5* 6*  CALCIUM 8.6* 8.6* 8.6* 8.8*  CREATININE 0.56 0.61 0.66 0.57  GFRNONAA >60 >60 >60 >60    LIVER FUNCTION TESTS: Recent Labs    01/21/22 1445 01/28/22 1330  BILITOT 1.0 0.3  AST 19 22  ALT 16 18  ALKPHOS 47 45  PROT 6.8 6.4*  ALBUMIN 4.1 3.9    Assessment and Plan:  Recurrent left M2/ MCA occlusion, patient was found to have a LV thrombus on 8/29  S/p M2/MCA thrombectomy on 8/29 by Dr. 9/29 S/p M2/MCA thrombectomy on  9/5 by [name]. Patient now with possible hematoma versus pseudoaneurysm  Assessment concerning for pseudoaneurysm. Vascular ultrasound ordered. Patient to maintain strict bedrest until ultrasound results are in. RN at the bedside and is aware of these new orders.   NIR will continue to follow.   Electronically Signed: Alwyn Ren, AGACNP-BC [phone] 02/02/2022, 8:27 AM   I spent a total of 15 Minutes at the the patient's bedside AND on the patient's hospital floor or unit, greater than 50% of which was counseling/coordinating care for vascular hematoma versus pseudoaneurysm

## 2022-02-02 NOTE — Progress Notes (Signed)
STROKE TEAM PROGRESS NOTE   SUBJECTIVE (INTERVAL HISTORY) Husband is at the bedside.  Hematoma overnight at puncture site. Stable tthis am.    INR still subtherapeutic this am.  Left groin U/S: no pseudoaneurysm.  Pharmacy following: on coumadin 7.36m and lovenox 656mBID.   OBJECTIVE Temp:  [97.4 F (36.3 C)-98.8 F (37.1 C)] 97.7 F (36.5 C) (09/10 0725) Pulse Rate:  [73-96] 75 (09/10 0725) Cardiac Rhythm: Normal sinus rhythm (09/10 0700) Resp:  [16] 16 (09/10 0725) BP: (92-123)/(62-93) 107/71 (09/10 0725) SpO2:  [98 %-100 %] 99 % (09/10 0725)  Recent Labs  Lab 01/28/22 1328 01/28/22 1747  GLUCAP 125* 103*    Recent Labs  Lab 01/30/22 0143 01/30/22 1620 01/31/22 0415 02/01/22 0700 02/02/22 0445  NA 139 138 139 141 142  K 2.9* 3.5 3.1* 3.4* 3.4*  CL 112* 109 109 110 109  CO2 21* _0 GLUCOSE 100* 98 92 109* 112*  BUN <5* <5* <5* <5* 6*  CREATININE 0.65 0.56 0.61 0.66 0.57  CALCIUM 8.3* 8.6* 8.6* 8.6* 8.8*  MG  --  1.9  --   --   --   PHOS  --  1.8*  --   --   --     Recent Labs  Lab 01/28/22 1330  AST 22  ALT 18  ALKPHOS 45  BILITOT 0.3  PROT 6.4*  ALBUMIN 3.9    Recent Labs  Lab 01/28/22 1330 01/28/22 1337 01/29/22 0300 01/30/22 0143 01/31/22 0415 02/01/22 0700 02/02/22 0445  WBC 4.7  --  8.6 5.3 5.2 4.8 4.5  NEUTROABS 2.5  --  6.1  --   --   --   --   HGB 12.0   < > 11.2* 9.2* 9.3* 9.4* 9.5*  HCT 35.5*   < > 33.3* 27.3* 28.0* 28.1* 27.9*  MCV 88.5  --  87.6 86.4 87.0 86.7 86.1  PLT 171  --  173 143* 157 178 183   < > = values in this interval not displayed.    No results for input(s): "CKTOTAL", "CKMB", "CKMBINDEX", "TROPONINI" in the last 168 hours. Recent Labs    01/30/22 1344 01/31/22 0415 02/01/22 0700 02/02/22 0445  LABPROT 14.4 14.9 18.8* 19.6*  INR 1.1 1.2 1.6* 1.7*    No results for input(s): "COLORURINE", "LABSPEC", "PHURINE", "GLUCOSEU", "HGBUR", "BILIRUBINUR", "KETONESUR", "PROTEINUR", "UROBILINOGEN",  "NITRITE", "LEUKOCYTESUR" in the last 72 hours.  Invalid input(s): "APPERANCEUR"      Component Value Date/Time   CHOL 223 (H) 01/22/2022 0351   TRIG 271 (H) 01/29/2022 0300   HDL 40 (L) 01/22/2022 0351   CHOLHDL 5.6 01/22/2022 0351   VLDL 33 01/22/2022 0351   LDLCALC 150 (H) 01/22/2022 0351   Lab Results  Component Value Date   HGBA1C 5.0 01/22/2022      Component Value Date/Time   LABOPIA NONE DETECTED 01/28/2022 1831   COCAINSCRNUR NONE DETECTED 01/28/2022 1831   LABBENZ NONE DETECTED 01/28/2022 1831   AMPHETMU NONE DETECTED 01/28/2022 1831   THCU POSITIVE (A) 01/28/2022 1831   LABBARB NONE DETECTED 01/28/2022 1831    Recent Labs  Lab 01/28/22 1330  ETH <10     I have personally reviewed the radiological images below and agree with the radiology interpretations.  VAS USK[place]ROIN PSEUDOANEURYSM  Result Date: 02/02/2022  ARTERIAL PSEUDOANEURYSM  Patient Name:  Chloe Johnson  Date of Exam:   02/02/2022 Medical Rec #: [id number]   Accession #:    [id number]  Date of Birth: Feb 20, 1956     Patient Gender: F Patient Age:   66 years Exam Location:  [facility] Procedure:      VAS [place] GROIN PSEUDOANEURYSM Referring Phys: Soyla Dryer --------------------------------------------------------------------------------  Exam: Left groin Indications: Patient complains of bruising. History: S/p catheterization. Comparison Study: No prior study Performing Technologist: Maudry Mayhew MHA, RDMS, RVT, RDCS  Examination Guidelines: A complete evaluation includes B-mode imaging, spectral Doppler, color Doppler, and power Doppler as needed of all accessible portions of each vessel. Bilateral testing is considered an integral part of a complete examination. Limited examinations for reoccurring indications may be performed as noted. +-----------+----------+---------+------+----------+ Left DuplexPSV (cm/s)Waveform PlaqueComment(s) +-----------+----------+---------+------+----------+ Ext  Iliac     166    triphasic                 +-----------+----------+---------+------+----------+ CFA            66    triphasic                 +-----------+----------+---------+------+----------+ PFA            73    triphasic                 +-----------+----------+---------+------+----------+ Prox SFA       77    triphasic                 +-----------+----------+---------+------+----------+ Left Vein comments: left CFV patent  Summary: No evidence of pseudoaneurysm, AVF or DVT. A mixed echogenic structure measuring approximately 1.0 cm x 0.9 cm is visualized at the left groin with ultrasound characteristics of a hematoma.     --------------------------------------------------------------------------------    Preliminary    DG Swallowing Func-Speech Pathology  Result Date: 01/30/2022 Table formatting from the original result was not included. Images from the original result were not included. Objective Swallowing Evaluation: Type of Study: MBS-Modified Barium Swallow Study  Patient Details Name: Chloe Johnson MRN: [id number] Date of Birth: 02-21-1956 Today's Date: 01/30/2022 Time: SLP Start Time (ACUTE ONLY): 1135 -SLP Stop Time (ACUTE ONLY): 1200 SLP Time Calculation (min) (ACUTE ONLY): 25 min Past Medical History: Past Medical History: Diagnosis Date  Anxiety   Back pain   Discitis of lumbar region 11/16/2013  L3-4/notes 11/24/2013, arms, neck  Exertional asthma   GERD (gastroesophageal reflux disease)   Hypertension   Kidney stones   "have always passed them"  Neck pain   Osteomyelitis ([place]) 11/23/2013  osteomyelitis, discitis   Sleep concern   uses Prozac for sleep  Past Surgical History: Past Surgical History: Procedure Laterality Date  BREAST CYST EXCISION Left 2010  "polypectomy"  IR CT HEAD LTD  01/21/2022  IR CT HEAD LTD  01/28/2022  IR PERCUTANEOUS ART THROMBECTOMY/INFUSION INTRACRANIAL INC DIAG ANGIO  01/21/2022  IR PERCUTANEOUS ART THROMBECTOMY/INFUSION INTRACRANIAL INC DIAG ANGIO   01/28/2022  IR US GUIDE VASC ACCESS RIGHT  01/22/2022  PICC LINE PLACE PERIPHERAL ([place] HX) Right   for use of Levaquin & Vancomycin, of note: she reports that she had a "flulike feeling"   RADIOLOGY WITH ANESTHESIA N/A 01/21/2022  Procedure: IR WITH ANESTHESIA;  Surgeon: Luanne Bras, MD;  Location: [facility];  Service: Radiology;  Laterality: N/A;  RADIOLOGY WITH ANESTHESIA N/A 01/28/2022  Procedure: IR WITH ANESTHESIA;  Surgeon: Radiologist, Medication, MD;  Location: [facility];  Service: Radiology;  Laterality: N/A;  TONSILLECTOMY AND ADENOIDECTOMY  1975  TUBAL LIGATION  1999 HPI: Patient is a 66 y.o. 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 On 01/25/22, patient began to have symptoms of dizziness followed by nausea and vomiting. Symptoms improved but then on 9/5 but she felt generally weak and was having trouble finding words. Husband observed right facial droop. She presented [facility] ED as code stroke on 01/28/22; CT head without acute abnormality but CTA unable to be performed due to IV infiltration. She was taken directly to IR for thrombectomy and intubated for this procedure, extubated in morning of 01/29/22. MRI brain showed new acute infarcts in left posterior frontal and anterior parietal cortex.  Subjective: awake, alert, husband and two daughters present in room  Recommendations for follow up therapy are one component of a multi-disciplinary discharge planning process, led by the attending physician.  Recommendations may be updated based on patient status, additional functional criteria and insurance authorization. Assessment / Plan / Recommendation   01/30/2022   1:15 PM Clinical Impressions Clinical Impression Pt demonstrates oropharygneal dysphagia with mild oral impairment secondary to right CN VII and XII weakness and potential right sided  sensory changes. Pt has decreased labial seal on the right with anterior spillage, improved with cues to increase labial tension with straw or cup. There is also decreased bolus cohesion and mild lingual residue unless cued for an oral hold. Pt initiates swallow as the bolus arrives past the valleculae resulting in instances of flash penetration or sensed aspiration (consecutive straw sips). Pt improved with single sips and oral hold prior to swallow. There was questionable trace silent aspiration of light barium pooled in pyriforms; penetrated post swallow. However this finding is inconclusive and  likely negligible given pts sensation and ejection of mild aspirate in prior event. Recommend initiating nectar thick liquids and dys 3 solids to aid in pt relearning swallowing motor patterns with a slower, more manageable texture prior to advancement to thin in 1-2 days if successful. 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);Dysphagia, unspecified (R13.10) Impact on safety and function Mild aspiration risk     01/30/2022   1:15 PM Treatment Recommendations Treatment Recommendations Therapy as outlined in treatment plan below     01/30/2022   1:15 PM Prognosis Prognosis for Safe Diet Advancement Good   01/30/2022   1:15 PM Diet Recommendations SLP Diet Recommendations Nectar thick liquid;Thin liquid;Dysphagia 3 (Mech soft) solids Liquid Administration via Cup;Straw Medication Administration Whole meds with puree Compensations Slow rate;Small sips/bites;Clear throat intermittently Postural Changes Remain semi-upright after after feeds/meals (Comment);Seated upright at 90 degrees     01/30/2022   1:15 PM Other Recommendations Oral Care Recommendations Oral care BID Other Recommendations Have oral suction available Follow Up Recommendations Acute inpatient rehab (3hours/day) Assistance recommended at discharge Frequent or constant Supervision/Assistance Functional Status Assessment Patient has had a recent decline in their functional  status and demonstrates the ability to make significant improvements in function in a reasonable and predictable amount of time.   01/30/2022   1:15 PM Frequency and Duration  Speech Therapy Frequency (ACUTE ONLY) min 2x/week Treatment Duration 2 weeks     01/30/2022   1:15 PM Oral Phase Oral Phase Impaired Oral - Thin Cup Decreased bolus cohesion Oral - Thin Straw Decreased bolus cohesion;Premature spillage;Lingual/palatal residue Oral - Puree WFL Oral - Regular Weak lingual manipulation;Lingual/palatal residue    01/30/2022   1:15 PM Pharyngeal Phase Pharyngeal Phase Impaired Pharyngeal- Nectar Cup Pharyngeal residue - pyriform Pharyngeal- Nectar Straw Pharyngeal  residue - pyriform Pharyngeal- Thin Cup Pharyngeal residue - pyriform;Penetration/Aspiration during swallow;Penetration/Aspiration before swallow Pharyngeal Material enters airway, CONTACTS cords and not ejected out;Material enters airway, remains ABOVE vocal cords then ejected out;Material does not enter airway Pharyngeal- Thin Straw Delayed swallow initiation-pyriform sinuses;Penetration/Aspiration before swallow;Penetration/Apiration after swallow;Pharyngeal residue - pyriform;Trace aspiration Pharyngeal Material enters airway, passes BELOW cords then ejected out;Material enters airway, CONTACTS cords and not ejected out;Material does not enter airway     No data to display    Herbie Baltimore, MA CCC-SLP Acute Rehabilitation Services Secure Chat Preferred Office [phone] Lynann Beaver 01/30/2022, 1:48 PM                     IR PERCUTANEOUS ART THROMBECTOMY/INFUSION INTRACRANIAL INC DIAG ANGIO  Result Date: 01/30/2022 INDICATION: 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 EXAM: 1. EMERGENT LARGE VESSEL OCCLUSION THROMBOLYSIS (anterior CIRCULATION) COMPARISON:  None Available. MEDICATIONS: No antibiotic was administered within 1 hour of the procedure. ANESTHESIA/SEDATION: General anesthesia. CONTRAST:  Omnipaque 300 150 mL.  FLUOROSCOPY TIME:  Fluoroscopy Time: 39 minutes 12 seconds (1289 mGy). COMPLICATIONS: None immediate. TECHNIQUE: Following a full explanation of the procedure along with the potential associated complications, an informed witnessed consent was obtained. The risks of intracranial hemorrhage of 10%, worsening neurological deficit, ventilator dependency, death and inability to revascularize were all reviewed in detail with the patient's spouse. The patient was then put under general anesthesia by the Department of Anesthesiology at [facility]. The left groin was prepped and draped in the usual sterile fashion. Thereafter using modified Seldinger technique, transfemoral access into the left common femoral artery was obtained without difficulty. Over a 0.035 inch guidewire an 8 French 25 cm Pinnacle sheath was inserted. Through this, and also over a 0.035 inch guidewire a combination of a wrist Simmons 2 support catheter inside of an 087 balloon guide catheter was advanced to the aortic region, and selectively positioned initially in the left common carotid artery and then advanced to the distal cervical left ICA over an 035 inch guidewire with support catheter. The guidewire was removed. Good aspiration obtained hub of the balloon guide catheter. Arteriogram was then performed proximally from the common carotid artery then the distal left internal carotid artery. FINDINGS: The left common carotid arteriogram demonstrates the left external carotid artery and its major branches to be widely patent. The left internal carotid artery at bulb to the cranial skull base demonstrates wide patency. Wide patency is noted of the petrous, the cavernous and the supraclinoid segments. Opacification of the left posterior cerebral artery via the left posterior communicating artery is noted. The left middle cerebral artery M1 segment is widely patent. The angiographic occlusion of the superior division mid M2 segment is noted.  The left MCA inferior division opacifies into the capillary and venous phases. The left anterior cerebral artery opacifies into the capillary and venous phases. Transient cross-filling via the anterior communicating artery of the right anterior cerebral artery A2 segment and the A1 segment is evident. PROCEDURE: Over an Aristotle softip micro guidewire with a J configuration, a combination of a 132 cm Zoom aspiration catheter inside of which was a 160 cm Phenom microcatheter was advanced to the proximal left middle cerebral artery. Using a torque device, access was obtained with the micro guidewire through the occluded superior division into the proximal M3 segment followed by the microcatheter. The guidewire was removed. Good aspiration was obtained from the hub of the microcatheter. A gentle contrast injection demonstrated slow  antegrade flow of contrast distally. A 3 mm x 36 mm retrieval device was then advanced to the distal end of the microcatheter and deployed in the usual manner. At this time the Zoom aspiration catheter was advanced into the proximal superior division. With proximal flow arrest in the left internal carotid artery constant aspiration was applied at the hub of the Zoom aspiration catheter and the aspiration device 2 minutes of the balloon guide catheter with a 20 mL syringe. Combination of the retrieval, the catheter and the Zoom aspiration catheter was retrieved removed. Following reversal of flow arrest, control arteriogram with the balloon guide catheter in the left internal carotid artery demonstrated near complete opacification of the superior division with gradual improved flow into the M3 M4 segment of the superior division. Moderate spasm noted in the proximal superior division responded to 5 aliquotes of 25 mcg of nitroglycerin intra-arterially. Also noted was a small non flow limiting filling defect in a mid parietal branch of the inferior division in the distal M3 M4 region which  also gradually started clearing over a period of 20 minutes. Control arteriogram performed through the balloon guide catheter in the mid cervical left demonstrated near complete revascularization of the left MCA achieving a TICI 2C revascularization. A control arteriogram performed through the left common carotid artery demonstrated small dissection of the distal left internal carotid artery at the skull base medially without any flow limitation. The balloon guide catheter was removed. Diagnostic catheter arteriogram was then performed of the right common carotid artery and the right vertebral artery. These demonstrated the right external carotid artery and its major branches to be widely patent. Internal carotid artery at the bulb to the cranial skull base demonstrates wide patency. The petrous, the cavernous and the supraclinoid segments demonstrate wide patency as do the right middle cerebral artery and the right anterior cerebral artery into the capillary and venous phases. The right vertebral artery demonstrates wide patency. More distally, patency is maintained of the basilar junction in the right posterior-inferior cerebellar artery. The basilar artery, the posterior cerebral arteries, the superior cerebellar arteries and the anterior-inferior cerebellar arteries demonstrate patency into the capillary and venous phases. Unopacified blood was noted in the basilar artery from the contralateral left vertebral artery. An 8 French Angio-Seal closure device demonstrates hemostasis at the left groin puncture site. Distal pulses remained palpable in both feet unchanged. A CT of the brain demonstrated no hemorrhagic complications. The patient was left intubated due to poor responsiveness following reversal of the anesthesia. Patient was then transferred to the neuro ICU post revascularization care. IMPRESSION: Status post endovascular revascularization of occluded division of the left middle cerebral artery achieving  a TICI 2C revascularization of the left MCA distribution. Puncture to first pass approximately 36 mins. PLAN: Follow-up as per referring MD. Electronically Signed   By: Luanne Bras M.D.   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 EXAM: 1. EMERGENT LARGE VESSEL OCCLUSION THROMBOLYSIS (anterior CIRCULATION) COMPARISON:  None Available. MEDICATIONS: No antibiotic was administered within 1 hour of the procedure. ANESTHESIA/SEDATION: General anesthesia. CONTRAST:  Omnipaque 300 150 mL. FLUOROSCOPY TIME:  Fluoroscopy Time: 39 minutes 12 seconds (1289 mGy). COMPLICATIONS: None immediate. TECHNIQUE: Following a full explanation of the procedure along with the potential associated complications, an informed witnessed consent was obtained. The risks of intracranial hemorrhage of 10%, worsening neurological deficit, ventilator dependency, death and inability to revascularize were all reviewed  in detail with the patient's spouse. The patient was then put under general anesthesia by the Department of Anesthesiology at [facility]. The left groin was prepped and draped in the usual sterile fashion. Thereafter using modified Seldinger technique, transfemoral access into the left common femoral artery was obtained without difficulty. Over a 0.035 inch guidewire an 8 French 25 cm Pinnacle sheath was inserted. Through this, and also over a 0.035 inch guidewire a combination of a wrist Simmons 2 support catheter inside of an 087 balloon guide catheter was advanced to the aortic region, and selectively positioned initially in the left common carotid artery and then advanced to the distal cervical left ICA over an 035 inch guidewire with support catheter. The guidewire was removed. Good aspiration obtained hub of the balloon guide catheter. Arteriogram was then performed proximally from the common carotid artery then the distal left  internal carotid artery. FINDINGS: The left common carotid arteriogram demonstrates the left external carotid artery and its major branches to be widely patent. The left internal carotid artery at bulb to the cranial skull base demonstrates wide patency. Wide patency is noted of the petrous, the cavernous and the supraclinoid segments. Opacification of the left posterior cerebral artery via the left posterior communicating artery is noted. The left middle cerebral artery M1 segment is widely patent. The angiographic occlusion of the superior division mid M2 segment is noted. The left MCA inferior division opacifies into the capillary and venous phases. The left anterior cerebral artery opacifies into the capillary and venous phases. Transient cross-filling via the anterior communicating artery of the right anterior cerebral artery A2 segment and the A1 segment is evident. PROCEDURE: Over an Aristotle softip micro guidewire with a J configuration, a combination of a 132 cm Zoom aspiration catheter inside of which was a 160 cm Phenom microcatheter was advanced to the proximal left middle cerebral artery. Using a torque device, access was obtained with the micro guidewire through the occluded superior division into the proximal M3 segment followed by the microcatheter. The guidewire was removed. Good aspiration was obtained from the hub of the microcatheter. A gentle contrast injection demonstrated slow antegrade flow of contrast distally. A 3 mm x 36 mm retrieval device was then advanced to the distal end of the microcatheter and deployed in the usual manner. At this time the Zoom aspiration catheter was advanced into the proximal superior division. With proximal flow arrest in the left internal carotid artery constant aspiration was applied at the hub of the Zoom aspiration catheter and the aspiration device 2 minutes of the balloon guide catheter with a 20 mL syringe. Combination of the retrieval, the catheter and  the Zoom aspiration catheter was retrieved removed. Following reversal of flow arrest, control arteriogram with the balloon guide catheter in the left internal carotid artery demonstrated near complete opacification of the superior division with gradual improved flow into the M3 M4 segment of the superior division. Moderate spasm noted in the proximal superior division responded to 5 aliquotes of 25 mcg of nitroglycerin intra-arterially. Also noted was a small non flow limiting filling defect in a mid parietal branch of the inferior division in the distal M3 M4 region which also gradually started clearing over a period of 20 minutes. Control arteriogram performed through the balloon guide catheter in the mid cervical left demonstrated near complete revascularization of the left MCA achieving a TICI 2C revascularization. A control arteriogram performed through the left common carotid artery demonstrated small dissection of the distal left  internal carotid artery at the skull base medially without any flow limitation. The balloon guide catheter was removed. Diagnostic catheter arteriogram was then performed of the right common carotid artery and the right vertebral artery. These demonstrated the right external carotid artery and its major branches to be widely patent. Internal carotid artery at the bulb to the cranial skull base demonstrates wide patency. The petrous, the cavernous and the supraclinoid segments demonstrate wide patency as do the right middle cerebral artery and the right anterior cerebral artery into the capillary and venous phases. The right vertebral artery demonstrates wide patency. More distally, patency is maintained of the basilar junction in the right posterior-inferior cerebellar artery. The basilar artery, the posterior cerebral arteries, the superior cerebellar arteries and the anterior-inferior cerebellar arteries demonstrate patency into the capillary and venous phases. Unopacified blood  was noted in the basilar artery from the contralateral left vertebral artery. An 8 French Angio-Seal closure device demonstrates hemostasis at the left groin puncture site. Distal pulses remained palpable in both feet unchanged. A CT of the brain demonstrated no hemorrhagic complications. The patient was left intubated due to poor responsiveness following reversal of the anesthesia. Patient was then transferred to the neuro ICU post revascularization care. IMPRESSION: Status post endovascular revascularization of occluded division of the left middle cerebral artery achieving a TICI 2C revascularization of the left MCA distribution. Puncture to first pass approximately 36 mins. PLAN: Follow-up as per referring MD. Electronically Signed   By: Luanne Bras M.D.   On: 01/30/2022 08:42   MR BRAIN WO CONTRAST  Result Date: 01/28/2022 CLINICAL DATA:  Right MCA stroke status post thrombectomy, follow-up EXAM: MRI HEAD WITHOUT CONTRAST MRA HEAD WITHOUT CONTRAST TECHNIQUE: Multiplanar, multi-echo pulse sequences of the brain and surrounding structures were acquired without intravenous contrast. Angiographic images of the Circle of Willis were acquired using MRA technique without intravenous contrast. COMPARISON:  01/22/2022 MRI head, no prior MRA, correlation is made with CTA head and neck 01/21/2022 FINDINGS: Brain: New foci of restricted diffusion with ADC correlate in the left posterior frontal and anterior parietal cortex (series 5, images 80 8-95). Redemonstrated focus of restricted diffusion with ADC correlate in the left insula (series 5, image 76 and series 6, image 26). Other previously noted foci of restricted diffusion are decreased in conspicuity and do not have persistent ADC correlates. No acute hemorrhage, mass, mass effect, or midline shift. No hydrocephalus or extra-axial collection. Punctate focus of hemosiderin deposition in the left posterior frontal lobe. Minimal T2 hyperintense signal in the  periventricular [place] matter and pons, likely the sequela of mild chronic small vessel ischemic disease. Vascular: Normal arterial flow voids. Skull and upper cervical spine: Normal marrow signal. Sinuses/Orbits: Minimal mucosal thickening in the ethmoid air cells. The orbits are unremarkable. Other: The mastoids are well aerated. MRA HEAD FINDINGS Anterior circulation: Both internal carotid arteries are patent to the termini, without significant stenosis. A1 segments patent. Normal anterior communicating artery. Anterior cerebral arteries are patent to their distal aspects. No M1 stenosis or occlusion. Distal MCA branches perfused and symmetric.In particular, the left MCA branches appear perfused and without focal stenosis. Posterior circulation: Vertebral arteries patent to the vertebrobasilar junction without stenosis. Basilar patent to its distal aspect. Superior cerebellar arteries patent bilaterally. Patent P1 segments. PCAs perfused to their distal aspects without stenosis. Diminutive left posterior communicating artery. The right posterior communicating artery is not definitively visualized. Anatomic variants: None significant IMPRESSION: 1. New acute infarcts in the left posterior frontal and anterior parietal  cortex. 2. No intracranial large vessel occlusion or significant stenosis. In particular, left MCA branches appear perfused and without focal stenosis. These results will be called to the ordering clinician or representative by the Radiologist Assistant, and communication documented in the PACS or [employer]. Electronically Signed   By: Merilyn Baba M.D.   On: 01/28/2022 23:40   MR ANGIO HEAD WO CONTRAST  Result Date: 01/28/2022 CLINICAL DATA:  Right MCA stroke status post thrombectomy, follow-up EXAM: MRI HEAD WITHOUT CONTRAST MRA HEAD WITHOUT CONTRAST TECHNIQUE: Multiplanar, multi-echo pulse sequences of the brain and surrounding structures were acquired without intravenous contrast.  Angiographic images of the Circle of Willis were acquired using MRA technique without intravenous contrast. COMPARISON:  01/22/2022 MRI head, no prior MRA, correlation is made with CTA head and neck 01/21/2022 FINDINGS: Brain: New foci of restricted diffusion with ADC correlate in the left posterior frontal and anterior parietal cortex (series 5, images 80 8-95). Redemonstrated focus of restricted diffusion with ADC correlate in the left insula (series 5, image 76 and series 6, image 26). Other previously noted foci of restricted diffusion are decreased in conspicuity and do not have persistent ADC correlates. No acute hemorrhage, mass, mass effect, or midline shift. No hydrocephalus or extra-axial collection. Punctate focus of hemosiderin deposition in the left posterior frontal lobe. Minimal T2 hyperintense signal in the periventricular [place] matter and pons, likely the sequela of mild chronic small vessel ischemic disease. Vascular: Normal arterial flow voids. Skull and upper cervical spine: Normal marrow signal. Sinuses/Orbits: Minimal mucosal thickening in the ethmoid air cells. The orbits are unremarkable. Other: The mastoids are well aerated. MRA HEAD FINDINGS Anterior circulation: Both internal carotid arteries are patent to the termini, without significant stenosis. A1 segments patent. Normal anterior communicating artery. Anterior cerebral arteries are patent to their distal aspects. No M1 stenosis or occlusion. Distal MCA branches perfused and symmetric.In particular, the left MCA branches appear perfused and without focal stenosis. Posterior circulation: Vertebral arteries patent to the vertebrobasilar junction without stenosis. Basilar patent to its distal aspect. Superior cerebellar arteries patent bilaterally. Patent P1 segments. PCAs perfused to their distal aspects without stenosis. Diminutive left posterior communicating artery. The right posterior communicating artery is not definitively  visualized. Anatomic variants: None significant IMPRESSION: 1. 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 2. No intracranial large vessel occlusion or significant stenosis. In particular, left MCA branches appear perfused and without focal stenosis. These results will be called to the ordering clinician or representative by the Radiologist Assistant, and communication documented in the PACS or [employer]. Electronically Signed   By: Merilyn Baba M.D.   On: 01/28/2022 23:40   [place] EKG SITE RITE  Result Date: 01/28/2022 If Site Rite image not attached, placement could not be confirmed due to current cardiac rhythm.  DG Abd 1 View  Result Date: 01/28/2022 CLINICAL DATA:  Evaluate OG tube placement EXAM: ABDOMEN - 1 VIEW COMPARISON:  None Available. FINDINGS: The OG tube terminates in the left upper quadrant, in the region of the stomach. IMPRESSION: The OG tube appears to terminate in the stomach. No other abnormalities. Electronically Signed   By: Dorise Bullion III M.D.   On: 01/28/2022 17:28   Portable Chest x-ray  Result Date: 01/28/2022 CLINICAL DATA:  [id number]. Encounter for ETT placement. EXAM: PORTABLE CHEST 1 VIEW COMPARISON:  Chest x-ray 01/26/2022, CT heart 01/23/2022 FINDINGS: Enteric tube with tip terminating 3 cm above the carina. Slightly more prominent aortic arch and  aortic knob likely due to positioning and AP portable technique. Otherwise the heart and mediastinal contours are unchanged normal limits. No focal consolidation. No pulmonary edema. No pleural effusion. No pneumothorax. No acute osseous abnormality. IMPRESSION: 1. Slightly more prominent aortic arch and aortic knob likely due to positioning and AP portable technique. Finding can likely be further evaluated on CT angio head and neck 01/28/2022. 2. No active disease. Electronically Signed   By: Iven Finn M.D.   On: 01/28/2022 17:21   CT HEAD CODE STROKE WO CONTRAST  Result Date:  01/28/2022 CLINICAL DATA:  Code stroke.  Neuro deficit, acute, stroke suspected EXAM: CT HEAD WITHOUT CONTRAST TECHNIQUE: Contiguous axial images were obtained from the base of the skull through the vertex without intravenous contrast. RADIATION DOSE REDUCTION: This exam was performed according to the departmental dose-optimization program which includes automated exposure control, adjustment of the mA and/or kV according to patient size and/or use of iterative reconstruction technique. COMPARISON:  CT head 01/21/2022. FINDINGS: Brain: No evidence of acute large vascular territory infarction, hemorrhage, hydrocephalus, extra-axial collection or mass lesion/mass effect. Vascular: No hyperdense vessel identified. Skull: No acute fracture. Sinuses/Orbits: Clear sinuses.  No acute orbital findings. Other: No mastoid effusions. ASPECTS [facility] Stroke Program Early CT Score) total score (0-10 with 10 being normal): 10. IMPRESSION: 1. No evidence of acute intracranial abnormality. 2. ASPECTS is 10. Code stroke imaging results were communicated on 01/28/2022 at 1:43 pm to provider Bhagat via secure text paging. Electronically Signed   By: Margaretha Sheffield M.D.   On: 01/28/2022 13:43   DG Chest 2 View  Result Date: 01/26/2022 CLINICAL DATA:  Chest and shoulder pain.  Recent MRI and CVA. EXAM: CHEST - 2 VIEW COMPARISON:  11/21/2021 FINDINGS: The lungs are clear without focal pneumonia, edema, pneumothorax or pleural effusion. The cardiopericardial silhouette is within normal limits for size. The visualized bony structures of the thorax are unremarkable. IMPRESSION: No active cardiopulmonary disease. Electronically Signed   By: Misty Stanley M.D.   On: 01/26/2022 12:03   CT CORONARY MORPH W/CTA COR W/SCORE W/CA W/CM &/OR WO/CM  Addendum Date: 01/23/2022   ADDENDUM REPORT: 01/23/2022 13:25 EXAM: OVER-READ INTERPRETATION  CT CHEST The following report is a limited chest CT over-read performed by radiologist Dr. Lindaann Slough [facility] on 01/23/2022. This over-read does not include interpretation of cardiac or coronary anatomy or pathology. The coronary CTA interpretation by the cardiologist is attached. COMPARISON:  Radiographs 11/21/2021. Coronary artery calcium score CT 10/04/2020 FINDINGS: Mediastinum/Nodes: No enlarged lymph nodes within the visualized mediastinum. Lungs/Pleura: There is no pleural effusion. The visualized lungs appear clear. Upper abdomen: No significant findings within the visualized upper abdomen. Musculoskeletal/Chest wall: No chest wall mass or suspicious osseous findings within the visualized chest. IMPRESSION: No significant extracardiac findings within the visualized chest. Electronically Signed   By: Richardean Sale M.D.   On: 01/23/2022 13:25   Result Date: 01/23/2022 HISTORY: Chest pain/anginal equiv, ECGs or troponins abnormal EXAM: Cardiac/Coronary CT TECHNIQUE: The patient was scanned on a [employer]. PROTOCOL: A 100 kV prospective scan was triggered in the descending thoracic aorta at 111 HU's. Axial non-contrast 3 mm slices were carried out through the heart. The data set was analyzed on a dedicated work station and scored using the Agatston method. Gantry rotation speed was 250 msecs and collimation was .6 mm. Heart rate was optimized medically and sl NTG was given. The 3D data set was reconstructed in 5% intervals of the 35-75 %  of the R-R cycle. Systolic and diastolic phases were analyzed on a dedicated work station using MPR, MIP and VRT modes. The patient received contrast. FINDINGS: Coronary calcium score: The patient's coronary artery calcium score is 0, which places the patient in the 0 percentile. Coronary arteries: Normal coronary origins.  Right dominance. Right Coronary Artery: Normal caliber vessel, gives rise to small PDA. No significant plaque or stenosis. Left Main Coronary Artery: Normal caliber vessel. No significant plaque or stenosis. Small  caliber ramus without significant plaque or stenosis. Left Anterior Descending Coronary Artery: Normal caliber vessel. Minimal noncalcified plaque with 1-24% stenosis. Gives rise to large first diagonal branch without significant stenosis. Distal LAD wraps apex Left Circumflex Artery: Normal caliber vessel. Very trivial noncalcified plaque without significant stenosis. Gives rise to large first and second, small third OM branches. Aorta: Normal size, 31 mm at the mid ascending aorta (level of the PA bifurcation) measured double oblique. Trivial aortic atherosclerosis. No dissection seen in visualized portions of the aorta. Aortic Valve: No calcifications. Trileaflet. Other findings: Normal pulmonary vein drainage into the left atrium. Normal left atrial appendage without a thrombus. Appendage is chicken wing type. Normal size of the pulmonary artery. Normal appearance of the pericardium. There is an apical LV thrombus, dimensions saves on PACS images. Maximum dimension 15.6 x 11.3 mm There is a small accessory left atrial segment on the cranial aspect of the left atrium. IMPRESSION: 1.  Very minimal nonobstructive CAD, CADRADS = 1. 2. Coronary calcium score of 0. This was 0 percentile for age and sex matched control. 3. Normal coronary origin with right dominance. 4. There is an apical LV thrombus, dimensions saves on PACS images. Maximum dimension 15.6 x 11.3 mm Findings reported to ordering provider. INTERPRETATION: CAD-RADS 1: Minimal non-obstructive CAD (0-24%). Consider non-atherosclerotic causes of chest pain. Consider preventive therapy and risk factor modification. Electronically Signed: By: Buford Dresser M.D. 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 She initially presented on 01/21/2022 with transient episode of aphasia followed sudden onset dizziness, nausea, vomiting, chest pain that radiated to upper back on Saturday. MRI brain showed small ischemic infarct of the frontal [place] matter on the left. Her troponins were elevated to over 300 and her echocardiogram showed apical hypokinesis and a left ventricular thrombus consistent with NSTEMI. Around 6 p.m. on 01/21/2022, patient developed worsening aphasia, NIHSS 6. Head CT was unremarkable. CT angiogram of the head and neck showed a left M2/M3-MCA occlusion. She was then transferred to our service for an emergency mechanical thrombectomy. EXAM: ULTRASOUND-GUIDED VASCULAR ACCESS DIAGNOSTIC CEREBRAL ANGIOGRAM MECHANICAL THROMBECTOMY FLAT PANEL HEAD CT COMPARISON:  CT/CT angiogram of the head and neck January 22, 2019 MEDICATIONS: No antibiotic was administered. ANESTHESIA/SEDATION: The procedure was performed under general anesthesia. CONTRAST:  75 mL of Omnipaque 300 milligram/mL. FLUOROSCOPY: Radiation Exposure Index (as provided by the fluoroscopic device): 938.1 mGy Kerma COMPLICATIONS: None immediate. TECHNIQUE: Informed written consent was obtained from the patient after a thorough discussion of the procedural risks, benefits and alternatives. All questions were addressed. Maximal Sterile Barrier Technique was utilized including caps, mask, sterile gowns, sterile gloves, sterile drape, hand hygiene and skin antiseptic. A timeout was performed prior to the initiation of the procedure. The right groin was prepped and  draped in the usual sterile fashion. Using a micropuncture kit and the modified Seldinger technique, access was gained to the right common femoral artery and an 8 French sheath was placed. Real-time ultrasound guidance was utilized for vascular access including the acquisition  of a permanent ultrasound image documenting patency of the accessed vessel. Under fluoroscopy, a Zoom 88 guide catheter was navigated over a 6 [place] VTK catheter and a 0.035" Terumo Glidewire into the aortic arch. The catheter was placed into the left common carotid artery and then advanced into the left internal carotid artery. The diagnostic catheter was removed. Frontal and lateral angiograms of the head were obtained. FINDINGS: 1. Normal caliber of the right common femoral artery, adequate for vascular access. 2. There is no occlusion of the distal left M2/MCA superior division branch extending to the M3 segment. PROCEDURE: Using biplane roadmap, a Vect 46 aspiration catheter was navigated over an Aristotle 24 microguidewire into the cavernous segment of the left ICA. The aspiration catheter was then advanced over the wire to the level of occlusion in the M2-M3 segment and connected to an aspiration pump. Continuous aspiration was performed for 2 minutes. The guide catheter was connected to a VacLok syringe. The aspiration catheter was subsequently removed under constant aspiration. The guide catheter was aspirated for debris. Left internal carotid artery angiograms with magnified frontal and lateral views of the head showed recanalization of the left MCA with slow flow in a few distal cortical branches (TICI 2C). No embolus to new territory. Flat panel CT of the head was obtained and post processed in a separate workstation with concurrent attending physician supervision. Selected images were sent to PACS. No evidence of hemorrhagic complication. Delayed left internal carotid artery angiogram showed persistent patency of the left MCA. Right common femoral artery angiogram was obtained in right anterior oblique view. The puncture is at the level of the common femoral artery. The artery has normal caliber, adequate for closure device. The sheath was exchanged over the wire for a Perclose prostyle which was  utilized for access closure. Immediate hemostasis was achieved. IMPRESSION: 1. Successful mechanical thrombectomy for treatment of a distal left M2/MCA occlusion with direct contact aspiration achieving complete recanalization (TICI 2C). 2. No thromboembolic or hemorrhagic complication. PLAN: Transfer to ICU for continued post stroke care. Electronically Signed   By: Pedro Earls M.D.   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 She initially presented on 01/21/2022 with transient episode of aphasia followed sudden onset dizziness, nausea, vomiting, chest pain that radiated to upper back on Saturday. MRI brain showed small ischemic infarct of the frontal [place] matter on the left. Her troponins were elevated to over 300 and her echocardiogram showed apical hypokinesis and a left ventricular thrombus consistent with NSTEMI. Around 6 p.m. on 01/21/2022, patient developed worsening aphasia, NIHSS 6. Head CT was unremarkable. CT angiogram of the head and neck showed a left M2/M3-MCA occlusion. She was then transferred to our service for an emergency mechanical thrombectomy. EXAM: ULTRASOUND-GUIDED VASCULAR ACCESS DIAGNOSTIC CEREBRAL ANGIOGRAM MECHANICAL THROMBECTOMY FLAT PANEL HEAD CT COMPARISON:  CT/CT angiogram of the head and neck January 22, 2019 MEDICATIONS: No antibiotic was administered. ANESTHESIA/SEDATION: The procedure was performed under general anesthesia. CONTRAST:  75 mL of Omnipaque 300 milligram/mL. FLUOROSCOPY:  Radiation Exposure Index (as provided by the fluoroscopic device): 782.9 mGy Kerma COMPLICATIONS: None immediate. TECHNIQUE: Informed written consent was obtained from  the patient after a thorough discussion of the procedural risks, benefits and alternatives. All questions were addressed. Maximal Sterile Barrier Technique was utilized including caps, mask, sterile gowns, sterile gloves, sterile drape, hand hygiene and skin antiseptic. A timeout was performed prior to the initiation of the procedure. The right groin was prepped and draped in the usual sterile fashion. Using a micropuncture kit and the modified Seldinger technique, access was gained to the right common femoral artery and an 8 French sheath was placed. Real-time ultrasound guidance was utilized for vascular access including the acquisition of a permanent ultrasound image documenting patency of the accessed vessel. Under fluoroscopy, a Zoom 88 guide catheter was navigated over a 6 [place] VTK catheter and a 0.035" Terumo Glidewire into the aortic arch. The catheter was placed into the left common carotid artery and then advanced into the left internal carotid artery. The diagnostic catheter was removed. Frontal and lateral angiograms of the head were obtained. FINDINGS: 1. Normal caliber of the right common femoral artery, adequate for vascular access. 2. There is no occlusion of the distal left M2/MCA superior division branch extending to the M3 segment. PROCEDURE: Using biplane roadmap, a Vect 46 aspiration catheter was navigated over an Aristotle 24 microguidewire into the cavernous segment of the left ICA. The aspiration catheter was then advanced over the wire to the level of occlusion in the M2-M3 segment and connected to an aspiration pump. Continuous aspiration was performed for 2 minutes. The guide catheter was connected to a VacLok syringe. The aspiration catheter was subsequently removed under constant aspiration. The guide catheter was aspirated for debris. Left internal carotid artery angiograms with magnified frontal and lateral views of the head showed recanalization of the left MCA with slow flow in a  few distal cortical branches (TICI 2C). No embolus to new territory. Flat panel CT of the head was obtained and post processed in a separate workstation with concurrent attending physician supervision. Selected images were sent to PACS. No evidence of hemorrhagic complication. Delayed left internal carotid artery angiogram showed persistent patency of the left MCA. Right common femoral artery angiogram was obtained in right anterior oblique view. The puncture is at the level of the common femoral artery. The artery has normal caliber, adequate for closure device. The sheath was exchanged over the wire for a Perclose prostyle which was utilized for access closure. Immediate hemostasis was achieved. IMPRESSION: 1. Successful mechanical thrombectomy for treatment of a distal left M2/MCA occlusion with direct contact aspiration achieving complete recanalization (TICI 2C). 2. No thromboembolic or hemorrhagic complication. PLAN: Transfer to ICU for continued post stroke care. Electronically Signed   By: Pedro Earls M.D.   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 She initially presented on 01/21/2022 with transient episode of aphasia followed sudden onset dizziness, nausea, vomiting, chest pain that radiated to upper back on Saturday. MRI brain showed small ischemic infarct of the frontal Silvio matter on the left. Her troponins were elevated to over 300 and her echocardiogram showed apical hypokinesis and a left ventricular thrombus consistent with NSTEMI. Around 6 p.m. on 01/21/2022, patient developed worsening  aphasia, NIHSS 6. Head CT was unremarkable. CT angiogram of the  head and neck showed a left M2/M3-MCA occlusion. She was then transferred to our service for an emergency mechanical thrombectomy. EXAM: ULTRASOUND-GUIDED VASCULAR ACCESS DIAGNOSTIC CEREBRAL ANGIOGRAM MECHANICAL THROMBECTOMY FLAT PANEL HEAD CT COMPARISON:  CT/CT angiogram of the head and neck January 22, 2019 MEDICATIONS: No antibiotic was administered. ANESTHESIA/SEDATION: The procedure was performed under general anesthesia. CONTRAST:  75 mL of Omnipaque 300 milligram/mL. FLUOROSCOPY: Radiation Exposure Index (as provided by the fluoroscopic device): 382.5 mGy Kerma COMPLICATIONS: None immediate. TECHNIQUE: Informed written consent was obtained from the patient after a thorough discussion of the procedural risks, benefits and alternatives. All questions were addressed. Maximal Sterile Barrier Technique was utilized including caps, mask, sterile gowns, sterile gloves, sterile drape, hand hygiene and skin antiseptic. A timeout was performed prior to the initiation of the procedure. The right groin was prepped and draped in the usual sterile fashion. Using a micropuncture kit and the modified Seldinger technique, access was gained to the right common femoral artery and an 8 French sheath was placed. Real-time ultrasound guidance was utilized for vascular access including the acquisition of a permanent ultrasound image documenting patency of the accessed vessel. Under fluoroscopy, a Zoom 88 guide catheter was navigated over a 6 [place] VTK catheter and a 0.035" Terumo Glidewire into the aortic arch. The catheter was placed into the left common carotid artery and then advanced into the left internal carotid artery. The diagnostic catheter was removed. Frontal and lateral angiograms of the head were obtained. FINDINGS: 1. Normal caliber of the right common femoral artery, adequate for vascular access. 2. There is no occlusion of the distal left M2/MCA superior division branch extending to the M3 segment. PROCEDURE: Using  biplane roadmap, a Vect 46 aspiration catheter was navigated over an Aristotle 24 microguidewire into the cavernous segment of the left ICA. The aspiration catheter was then advanced over the wire to the level of occlusion in the M2-M3 segment and connected to an aspiration pump. Continuous aspiration was performed for 2 minutes. The guide catheter was connected to a VacLok syringe. The aspiration catheter was subsequently removed under constant aspiration. The guide catheter was aspirated for debris. Left internal carotid artery angiograms with magnified frontal and lateral views of the head showed recanalization of the left MCA with slow flow in a few distal cortical branches (TICI 2C). No embolus to new territory. Flat panel CT of the head was obtained and post processed in a separate workstation with concurrent attending physician supervision. Selected images were sent to PACS. No evidence of hemorrhagic complication. Delayed left internal carotid artery angiogram showed persistent patency of the left MCA. Right common femoral artery angiogram was obtained in right anterior oblique view. The puncture is at the level of the common femoral artery. The artery has normal caliber, adequate for closure device. The sheath was exchanged over the wire for a Perclose prostyle which was utilized for access closure. Immediate hemostasis was achieved. IMPRESSION: 1. Successful mechanical thrombectomy for treatment of a distal left M2/MCA occlusion with direct contact aspiration achieving complete recanalization (TICI 2C). 2. No thromboembolic or hemorrhagic complication. PLAN: Transfer to ICU for continued post stroke care. Electronically Signed   By: Pedro Earls M.D.   On: 01/22/2022 14:25   MR BRAIN WO CONTRAST  Result Date: 01/22/2022 CLINICAL DATA:  Follow-up stroke EXAM: MRI HEAD WITHOUT CONTRAST TECHNIQUE: Multiplanar, multiecho pulse sequences of the brain and surrounding structures were obtained  without intravenous  contrast. COMPARISON:  Brain MRI and CT/CTA head and neck 1 day prior FINDINGS: Brain: A punctate fusion of diffusion restriction in the left frontal lobe periventricular Prats matter is unchanged compared to the study from 1 day prior. Additional small foci of diffusion restriction in the left external capsule, insula, and frontal lobe cortex are new. There is no associated hemorrhage or mass effect. Parenchymal volume is normal. The ventricles are normal in size. Parenchymal signal is otherwise normal, with no significant burden of under vitamin chronic small vessel ischemic change. There is no mass lesion.  There is no mass effect or midline shift. Vascular: Normal flow voids. Skull and upper cervical spine: Normal marrow signal. Sinuses/Orbits: The paranasal sinuses are clear. The globes and orbits are unremarkable. Other: None. IMPRESSION: New punctate acute infarcts in the left external capsule, insula, and frontal lobe cortex, and unchanged additional punctate infarct in the left frontal lobe Palmieri matter. Electronically Signed   By: Valetta Mole M.D.   On: 01/22/2022 11:39   CT CEREBRAL PERFUSION W CONTRAST  Result Date: 01/21/2022 CLINICAL DATA:  Neuro deficit, acute, stroke suspected. EXAM: CT ANGIOGRAPHY HEAD AND NECK CT PERFUSION BRAIN TECHNIQUE: Multidetector CT imaging of the head and neck was performed using the standard protocol during bolus administration of intravenous contrast. Multiplanar CT image reconstructions and MIPs were obtained to evaluate the vascular anatomy. Carotid stenosis measurements (when applicable) are obtained utilizing NASCET criteria, using the distal internal carotid diameter as the denominator. Multiphase CT imaging of the brain was performed following IV bolus contrast injection. Subsequent parametric perfusion maps were calculated using RAPID software. RADIATION DOSE REDUCTION: This exam was performed according to the departmental dose-optimization  program which includes automated exposure control, adjustment of the mA and/or kV according to patient size and/or use of iterative reconstruction technique. CONTRAST:  117m OMNIPAQUE IOHEXOL 350 MG/ML SOLN COMPARISON:  Brain MRI 01/21/2022. Noncontrast head CT performed earlier today 01/21/2022. FINDINGS: CTA NECK FINDINGS Aortic arch: Standard aortic branching. Mild atherosclerotic plaque within the visualized aortic arch and proximal major branch vessels of the neck. Streak and beam hardening artifact arising from a dense right-sided contrast bolus partially obscures the right subclavian artery. Within this limitation, there is no appreciable hemodynamically significant innominate or proximal subclavian artery stenosis. Right carotid system: CCA and ICA patent within the neck without stenosis. Mild atherosclerotic plaque about the carotid bifurcation. Left carotid system: CCA and ICA patent within the neck without stenosis. Minimal atherosclerotic plaque about the carotid bifurcation. Vertebral arteries: Vertebral arteries codominant and patent within the neck without stenosis. Nonstenotic atherosclerotic plaque at the origin of the right vertebral artery. Skeleton: Cervical spondylosis. No acute fracture or aggressive osseous lesion. Other neck: No neck mass or cervical lymphadenopathy. Upper chest: No consolidation within the imaged lung apices. Review of the MIP images confirms the above findings CTA HEAD FINDINGS Anterior circulation: The intracranial internal carotid arteries are patent. The M1 middle cerebral arteries are patent. Occluded mid M2 left MCA vessel (with distal reconstitution, likely due to collateral flow) (for instance as seen on series 15, image 15). The anterior cerebral arteries are patent. No intracranial aneurysm is identified. Posterior circulation: The intracranial vertebral arteries are patent. The basilar artery is patent. The posterior cerebral arteries are patent. Posterior  communicating arteries are diminutive or absent, bilaterally. Venous sinuses: Within the limitations of contrast timing, no convincing thrombus. Anatomic variants: As described. Review of the MIP images confirms the above findings CT Brain Perfusion Findings: CBF (<30%) Volume: 075mPerfusion (  Tmax>6.0s) volume: 40m Mismatch Volume: 032mInfarction Location:None identified CTA head impression and CT perfusion head impression called by telephone at the time of interpretation on 01/21/2022 at 6:50 pm to provider [facility] who verbally acknowledged these results. IMPRESSION: CTA neck: 1. The common carotid, internal carotid and vertebral arteries are patent within the neck without stenosis. Minimal non-stenotic atherosclerotic plaque about the carotid bifurcations and at the origin of the right vertebral artery. 2.  Aortic Atherosclerosis (ICD10-I70.0). CTA head: Occluded mid M2 left MCA vessel (with distal reconstitution, likely due to collateral flow). CT perfusion head: The perfusion software identifies a 7 mL region of critically hypoperfused parenchyma within the left insula/frontal lobe (utilizing the Tmax>6 seconds threshold). No core infarct is identified. Reported mismatch volume: 7 mL. Electronically Signed   By: KyKellie Simmering.O.   On: 01/21/2022 19:09   CT ANGIO HEAD NECK W WO CM (CODE STROKE)  Result Date: 01/21/2022 CLINICAL DATA:  Neuro deficit, acute, stroke suspected. EXAM: CT ANGIOGRAPHY HEAD AND NECK CT PERFUSION BRAIN TECHNIQUE: Multidetector CT imaging of the head and neck was performed using the standard protocol during bolus administration of intravenous contrast. Multiplanar CT image reconstructions and MIPs were obtained to evaluate the vascular anatomy. Carotid stenosis measurements (when applicable) are obtained utilizing NASCET criteria, using the distal internal carotid diameter as the denominator. Multiphase CT imaging of the brain was performed following IV bolus contrast  injection. Subsequent parametric perfusion maps were calculated using RAPID software. RADIATION DOSE REDUCTION: This exam was performed according to the departmental dose-optimization program which includes automated exposure control, adjustment of the mA and/or kV according to patient size and/or use of iterative reconstruction technique. CONTRAST:  10093mMNIPAQUE IOHEXOL 350 MG/ML SOLN COMPARISON:  Brain MRI 01/21/2022. Noncontrast head CT performed earlier today 01/21/2022. FINDINGS: CTA NECK FINDINGS Aortic arch: Standard aortic branching. Mild atherosclerotic plaque within the visualized aortic arch and proximal major branch vessels of the neck. Streak and beam hardening artifact arising from a dense right-sided contrast bolus partially obscures the right subclavian artery. Within this limitation, there is no appreciable hemodynamically significant innominate or proximal subclavian artery stenosis. Right carotid system: CCA and ICA patent within the neck without stenosis. Mild atherosclerotic plaque about the carotid bifurcation. Left carotid system: CCA and ICA patent within the neck without stenosis. Minimal atherosclerotic plaque about the carotid bifurcation. Vertebral arteries: Vertebral arteries codominant and patent within the neck without stenosis. Nonstenotic atherosclerotic plaque at the origin of the right vertebral artery. Skeleton: Cervical spondylosis. No acute fracture or aggressive osseous lesion. Other neck: No neck mass or cervical lymphadenopathy. Upper chest: No consolidation within the imaged lung apices. Review of the MIP images confirms the above findings CTA HEAD FINDINGS Anterior circulation: The intracranial internal carotid arteries are patent. The M1 middle cerebral arteries are patent. Occluded mid M2 left MCA vessel (with distal reconstitution, likely due to collateral flow) (for instance as seen on series 15, image 15). The anterior cerebral arteries are patent. No intracranial  aneurysm is identified. Posterior circulation: The intracranial vertebral arteries are patent. The basilar artery is patent. The posterior cerebral arteries are patent. Posterior communicating arteries are diminutive or absent, bilaterally. Venous sinuses: Within the limitations of contrast timing, no convincing thrombus. Anatomic variants: As described. Review of the MIP images confirms the above findings CT Brain Perfusion Findings: CBF (<30%) Volume: 0mL40mrfusion (Tmax>6.0s) volume: 7mL 65mmatch Volume: 0mL I78mrction Location:None identified CTA head impression and CT perfusion head impression called by telephone at the time  of interpretation on 01/21/2022 at 6:50 pm to provider MCNEILL [facility] , who verbally acknowledged these results. IMPRESSION: CTA neck: 1. The common carotid, internal carotid and vertebral arteries are patent within the neck without stenosis. Minimal non-stenotic atherosclerotic plaque about the carotid bifurcations and at the origin of the right vertebral artery. 2.  Aortic Atherosclerosis (ICD10-I70.0). CTA head: Occluded mid M2 left MCA vessel (with distal reconstitution, likely due to collateral flow). CT perfusion head: The perfusion software identifies a 7 mL region of critically hypoperfused parenchyma within the left insula/frontal lobe (utilizing the Tmax>6 seconds threshold). No core infarct is identified. Reported mismatch volume: 7 mL. Electronically Signed   By: Kellie Simmering D.O.   On: 01/21/2022 19:09   CT HEAD CODE STROKE WO CONTRAST  Result Date: 01/21/2022 CLINICAL DATA:  Code stroke. Neuro deficit, acute, stroke suspected. EXAM: CT HEAD WITHOUT CONTRAST TECHNIQUE: Contiguous axial images were obtained from the base of the skull through the vertex without intravenous contrast. RADIATION DOSE REDUCTION: This exam was performed according to the departmental dose-optimization program which includes automated exposure control, adjustment of the mA and/or kV according to  patient size and/or use of iterative reconstruction technique. COMPARISON:  Same-day brain MRI 01/21/2022. FINDINGS: Brain: No age advanced or lobar predominant parenchymal atrophy. A subcentimeter acute infarct within the left frontal lobe Vanderwerf matter (adjacent to the left lateral ventricle frontal horn) is occult by CT and was better appreciated on the brain MRI performed earlier today. No CT evidence of interval acute infarct. Minimal chronic small-vessel image changes within the cerebral Uballe matter and pons, better appreciated on the prior brain MRI. There is no acute intracranial hemorrhage. No extra-axial fluid collection. No evidence of an intracranial mass. No midline shift. Vascular: No hyperdense vessel. Skull: No fracture or aggressive osseous lesion. Sinuses/Orbits: No mass or acute finding within the imaged orbits. Trace mucosal thickening within the anterior left ethmoid air cells. ASPECTS ([place] Stroke Program Early CT Score) - Ganglionic level infarction (caudate, lentiform nuclei, internal capsule, insula, M1-M3 cortex): 7 - Supraganglionic infarction (M4-M6 cortex): 3 Total score (0-10 with 10 being normal): 10 These results were communicated to Dr. Leonel Ramsay at 6:27 pmon 8/29/2023by text page via the [facility] messaging system. IMPRESSION: A known subcentimeter acute infarct within the left frontal lobe Lick matter is occult by CT, and was better appreciated on the brain MRI performed earlier today. No CT evidence of interval acute intracranial abnormality. Minimal chronic small-vessel ischemic changes within the cerebral Hellen matter and pons. Electronically Signed   By: Kellie Simmering D.O.   On: 01/21/2022 18:27   ECHOCARDIOGRAM COMPLETE  Result Date: 01/21/2022    ECHOCARDIOGRAM REPORT   Patient Name:   Chloe Johnson Date of Exam: 01/21/2022 Medical Rec #:  [id number]    Height:       61.0 in Accession #:    [id number]   Weight:       171.0 lb Date of Birth:  06-Apr-1956    BSA:          1.767  m Patient Age:    17 years     BP:           146/125 mmHg Patient Gender: F            HR:           72 bpm. Exam Location:  Inpatient Procedure: 2D Echo, Color Doppler and Cardiac Doppler Indications:    Stroke i63.9  History:  Patient has no prior history of Echocardiogram examinations.                 Risk Factors:Hypertension.  Sonographer:    Raquel Sarna Senior RDCS Referring Phys: [place]  1. Left ventricular ejection fraction, by estimation, is 45 to 50%. The left ventricle has mildly decreased function. The left ventricle demonstrates regional wall motion abnormalities (apical hypokinesis with out true aneurysm and with presence of an LV thrombus 1.84 X 0.9 X 1.2 cm).  2. The mitral valve is grossly normal. No evidence of mitral valve regurgitation. No evidence of mitral stenosis.  3. The aortic valve is tricuspid. Aortic valve regurgitation is not visualized. No aortic stenosis is present.  4. Right ventricular systolic function is normal. The right ventricular size is normal. Tricuspid regurgitation signal is inadequate for assessing PA pressure.  5. The inferior vena cava is normal in size with greater than 50% respiratory variability, suggesting right atrial pressure of 3 mmHg. Comparison(s): No prior Echocardiogram. 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 Cardiology aware. FINDINGS  Left Ventricle: Left ventricular ejection fraction, by estimation, is 45 to 50%. The left ventricle has mildly decreased function. The left ventricle demonstrates regional wall motion abnormalities. The left ventricular internal cavity size was small. There is no left ventricular hypertrophy. Left ventricular diastolic parameters are consistent with Grade I diastolic dysfunction (impaired relaxation).  LV Wall Scoring: The apical lateral segment, apical septal segment, apical anterior segment, and apical inferior segment are hypokinetic. Right Ventricle: The  right ventricular size is normal. No increase in right ventricular wall thickness. Right ventricular systolic function is normal. Tricuspid regurgitation signal is inadequate for assessing PA pressure. Left Atrium: Left atrial size was normal in size. Right Atrium: Right atrial size was normal in size. Pericardium: There is no evidence of pericardial effusion. Mitral Valve: The mitral valve is grossly normal. No evidence of mitral valve regurgitation. No evidence of mitral valve stenosis. Tricuspid Valve: The tricuspid valve is normal in structure. Tricuspid valve regurgitation is not demonstrated. No evidence of tricuspid stenosis. Aortic Valve: The aortic valve is tricuspid. Aortic valve regurgitation is not visualized. No aortic stenosis is present. Pulmonic Valve: The pulmonic valve was normal in structure. Pulmonic valve regurgitation is not visualized. No evidence of pulmonic stenosis. Aorta: The aortic root and ascending aorta are structurally normal, with no evidence of dilitation. Venous: The inferior vena cava is normal in size with greater than 50% respiratory variability, suggesting right atrial pressure of 3 mmHg. IAS/Shunts: No atrial level shunt detected by color flow Doppler.  LEFT VENTRICLE PLAX 2D LVIDd:         3.60 cm     Diastology LVIDs:         2.50 cm     LV e' medial:    6.68 cm/s LV PW:         0.80 cm     LV E/e' medial:  6.5 LV IVS:        0.80 cm     LV e' lateral:   11.00 cm/s LVOT diam:     1.70 cm     LV E/e' lateral: 3.9 LV SV:         42 LV SV Index:   24 LVOT Area:     2.27 cm  LV Volumes (MOD) LV vol d, MOD A2C: 70.1 ml LV vol d, MOD A4C: 46.6 ml LV vol s, MOD A2C: 37.3 ml LV vol s, MOD  A4C: 23.3 ml LV SV MOD A2C:     32.8 ml LV SV MOD A4C:     46.6 ml LV SV MOD BP:      28.7 ml RIGHT VENTRICLE RV S prime:     9.32 cm/s TAPSE (M-mode): 1.7 cm LEFT ATRIUM             Index        RIGHT ATRIUM          Index LA diam:        2.70 cm 1.53 cm/m   RA Area:     9.52 cm LA Vol (A2C):    28.1 ml 15.90 ml/m  RA Volume:   20.50 ml 11.60 ml/m LA Vol (A4C):   26.3 ml 14.88 ml/m LA Biplane Vol: 28.8 ml 16.30 ml/m  AORTIC VALVE LVOT Vmax:   115.00 cm/s LVOT Vmean:  68.700 cm/s LVOT VTI:    0.185 m  AORTA Ao Root diam: 2.90 cm Ao Asc diam:  3.20 cm MITRAL VALVE MV Area (PHT): 3.11 cm    SHUNTS MV Decel Time: 244 msec    Systemic VTI:  0.18 m MV E velocity: 43.20 cm/s  Systemic Diam: 1.70 cm MV A velocity: 64.50 cm/s MV E/A ratio:  0.67 Rudean Haskell MD Electronically signed by Rudean Haskell MD Signature Date/Time: 01/21/2022/4:04:02 PM    Final    MR BRAIN WO CONTRAST  Result Date: 01/21/2022 CLINICAL DATA:  TIA.  Sudden onset of aphasia, now resolved. EXAM: MRI HEAD WITHOUT CONTRAST TECHNIQUE: Multiplanar, multiecho pulse sequences of the brain and surrounding structures were obtained without intravenous contrast. COMPARISON:  None Available. FINDINGS: Brain: Small acute infarct in the left frontal Bir matter. No hemorrhage, hydrocephalus, mass, or atrophy. Minimal chronic small vessel changes may be present in the pons. No prior infarct is seen. Vascular: Major flow voids are preserved Skull and upper cervical spine: Normal marrow signal. Degenerative facet spurring where covered in the cervical spine with mild C2-3 anterolisthesis. Sinuses/Orbits: Negative IMPRESSION: Small acute infarct in the left frontal Mcglone matter. Electronically Signed   By: Jorje Guild M.D.   On: 01/21/2022 12:32     PHYSICAL EXAM  Temp:  [97.4 F (36.3 C)-98.8 F (37.1 C)] 97.7 F (36.5 C) (09/10 0725) Pulse Rate:  [73-96] 75 (09/10 0725) Resp:  [16] 16 (09/10 0725) BP: (92-123)/(62-93) 107/71 (09/10 0725) SpO2:  [98 %-100 %] 99 % (09/10 0725)  General - Well nourished, well developed, in no apparent distress. Sitting up in bed.  Skin: left groin hematoma stable. Not growing past ink link drawn.   Neuro - awake, alert, eyes open, expressive aphasia, able to make sounds out but not  meaningful words, following simple command..  Not able to name and repeat. No gaze palsy, tracking bilaterally, blinking to visual threat bilaterally.  Right mild facial droop. Tongue protrusion difficulty. LUE 5/5, no drift, RUE 4-/5 proximal and 0/5 distally. Bilaterally LEs 5/5, no drift. Sensation symmetrical bilaterally subjectively, left FTN intact, gait not tested.    ASSESSMENT/PLAN Ms. ADWOA AXE is a 66 y.o. 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 No tPA given due to on Eliquis and recent stroke.    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 INR goal 2.5 Continue ASA 81 per cardiology. Ongoing aggressive stroke risk factor management Therapy recommendations:  CIR Disposition:  pending  Hx of stroke and LV thrombus 8/29 admitted for nausea vomiting dizziness chest pain and then developed right facial droop and aphasia.  Found to have troponin > 300, EF 45 to 50% with LV thrombus.  CTA coronary artery showed minimal CAD.  CT no acute abnormality.  CTA head and neck showed left M2 occlusion.  CTP 0/7.  Status post EVT with TICI2c.  MRI showed left frontal Hailes  matter punctate infarct.  Repeat MRI showed left external capsule, insular cortex and frontal cortex punctate infarcts.  LDL 150, A1c 5.0.  UDS positive for THC.  Patient was put on heparin IV and aspirin and eventually discharged on Eliquis and aspirin as well as Crestor 20. Per family, patient has slight expressive aphasia since discharge, no physical deficit. Cardiology Dr. Johnsie Cancel on board, recommend coumadin with INR goal 2.5. on lovenox bridge.   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  Lupus - ANA and ds DNA negative Low K 34mq given today.  Hospital day # 5    02/02/2022 10:46 AM

## 2022-02-02 NOTE — Progress Notes (Signed)
Patient hematoma has remain unchanged thru the shift.

## 2022-02-02 NOTE — Progress Notes (Signed)
Limited left lower extremity arterial duplex for pseudoaneurysm evaluation completed. Refer to "CV Proc" under chart review to view preliminary results.  02/02/2022 9:45 AM Eula Fried., MHA, RVT, RDCS, RDMS

## 2022-02-03 DIAGNOSIS — N3000 Acute cystitis without hematuria: Secondary | ICD-10-CM | POA: Diagnosis not present

## 2022-02-03 DIAGNOSIS — I639 Cerebral infarction, unspecified: Secondary | ICD-10-CM | POA: Diagnosis not present

## 2022-02-03 LAB — CBC
HCT: 28.6 % — ABNORMAL LOW (ref 36.0–46.0)
Hemoglobin: 9.7 g/dL — ABNORMAL LOW (ref 12.0–15.0)
MCH: 29.3 pg (ref 26.0–34.0)
MCHC: 33.9 g/dL (ref 30.0–36.0)
MCV: 86.4 fL (ref 80.0–100.0)
Platelets: 184 10*3/uL (ref 150–400)
RBC: 3.31 MIL/uL — ABNORMAL LOW (ref 3.87–5.11)
RDW: 11.9 % (ref 11.5–15.5)
WBC: 4.4 10*3/uL (ref 4.0–10.5)
nRBC: 0 % (ref 0.0–0.2)

## 2022-02-03 LAB — PROTIME-INR
INR: 2.1 — ABNORMAL HIGH (ref 0.8–1.2)
Prothrombin Time: 23.4 seconds — ABNORMAL HIGH (ref 11.4–15.2)

## 2022-02-03 LAB — BASIC METABOLIC PANEL
Anion gap: 6 (ref 5–15)
BUN: 5 mg/dL — ABNORMAL LOW (ref 8–23)
CO2: 25 mmol/L (ref 22–32)
Calcium: 8.9 mg/dL (ref 8.9–10.3)
Chloride: 109 mmol/L (ref 98–111)
Creatinine, Ser: 0.59 mg/dL (ref 0.44–1.00)
GFR, Estimated: >60 mL/min (ref >60)
Glucose, Bld: 108 mg/dL — ABNORMAL HIGH (ref 70–99)
Potassium: 3.5 mmol/L (ref 3.5–5.1)
Sodium: 140 mmol/L (ref 135–145)

## 2022-02-03 LAB — PHOSPHORUS: Phosphorus: 4.1 mg/dL (ref 2.5–4.6)

## 2022-02-03 LAB — MAGNESIUM: Magnesium: 2.1 mg/dL (ref 1.7–2.4)

## 2022-02-03 MED ORDER — ONDANSETRON HCL 4 MG/2ML IJ SOLN
4.0000 mg | Freq: Four times a day (QID) | INTRAMUSCULAR | Status: DC | PRN
  Administered 2022-02-03 – 2022-02-04 (×2): 4 mg via INTRAVENOUS
  Filled 2022-02-03 (×2): qty 2

## 2022-02-03 MED ORDER — SODIUM CHLORIDE 0.9 % IV SOLN
1.0000 g | INTRAVENOUS | Status: DC
  Administered 2022-02-03 – 2022-02-04 (×2): 1 g via INTRAVENOUS
  Filled 2022-02-03 (×2): qty 10

## 2022-02-03 MED ORDER — WARFARIN SODIUM 6 MG PO TABS
6.0000 mg | ORAL_TABLET | Freq: Once | ORAL | Status: AC
  Administered 2022-02-03: 6 mg via ORAL
  Filled 2022-02-03: qty 1

## 2022-02-03 MED ORDER — DIPHENHYDRAMINE HCL 50 MG/ML IJ SOLN: 12.5000 mg | Freq: Four times a day (QID) | INTRAMUSCULAR | Status: DC | PRN

## 2022-02-03 NOTE — Progress Notes (Signed)
Referring Physician(s): Caryl Pina, MD  Supervising Physician: Julieanne Cotton  Patient Status:  [facility] - In-pt  Chief Complaint: Follow up left groin hematoma  Subjective:  Patient sitting up in bed, husband at bedside. They are concerned that she may not be able to go to inpatient rehab since she was d/ced from PT and is only working with OT. Ms. Havrilla attempts to verbalize at times but is unable to form words currently, she understands all questions and responds appropriately with hand gestures/word board. She is not sure if the pain at her left groin site is worse, it does not feel better. She also thinks the bruising and swelling is about the same as yesterday. She also notes that it feels like it's hard to move her tongue. She states that she had an episode of right arm shaking yesterday but denies any other shaking movements.   Allergies: Doxycycline, Erythromycin, Iodine, Latex, Shellfish allergy, Strawberry extract, Vancomycin, Vicodin [hydrocodone-acetaminophen], Dilaudid [hydromorphone hcl], Keflex [cephalexin], Penicillins, and Sulfa antibiotics  Medications: Prior to Admission medications   Medication Sig Start Date End Date Taking? Authorizing Provider  ALPRAZolam Prudy Feeler) 0.5 MG tablet Take 0.5 mg by mouth at bedtime. 12/23/21  Yes [provider]  apixaban (ELIQUIS) 5 MG TABS tablet Take 1 tablet (5 mg total) by mouth 2 (two) times daily. 01/23/22  Yes Karie Fetch, MD  cyclobenzaprine (FLEXERIL) 10 MG tablet Take 1 tablet (10 mg total) by mouth 3 (three) times daily as needed for muscle spasms. 11/17/14  Yes Tia Alert, MD  guaiFENesin (MUCINEX) 600 MG 12 hr tablet Take 600 mg by mouth daily as needed for cough or to loosen phlegm.   Yes [provider]  loratadine (CLARITIN) 10 MG tablet Take 10 mg by mouth every morning.   Yes [provider]  MYRBETRIQ 25 MG TB24 tablet Take 25 mg by mouth daily. 12/31/21  Yes [provider]   pregabalin (LYRICA) 25 MG capsule Take 1 capsule (25 mg total) by mouth 2 (two) times daily. 01/26/22 02/25/22 Yes Eber Hong, MD  rosuvastatin (CRESTOR) 20 MG tablet Take 1 tablet (20 mg total) by mouth daily. 01/24/22  Yes Karie Fetch, MD  sacubitril-valsartan (ENTRESTO) 24-26 MG Take 1 tablet by mouth 2 (two) times daily. 01/23/22  Yes Karie Fetch, MD     Vital Signs: BP 118/78 (BP Location: Right Arm)   Pulse 78   Temp 98.5 F (36.9 C) (Oral)   Resp 16   Ht 5\' 1"  (1.549 m)   Wt 130 lb 1.1 oz (59 kg)   SpO2 98%   BMI 24.58 kg/m   Physical Exam Vitals and nursing note reviewed.  Constitutional:      General: She is not in acute distress. Cardiovascular:     Rate and Rhythm: Normal rate.  Pulmonary:     Effort: Pulmonary effort is normal.  Skin:    General: Skin is dry.     Comments: (+) left groin with diffuse bruising from upper thigh to lower abdomen. Small firm palpable area at previous puncture site, mildly tender to palpation. No active bleeding or drainage.  (+) Left forearm with diffuse bruising, mildly tender, no active bleeding. Left upper extremity PICC in place.  Neurological:     Mental Status: She is alert.   Alert, awake Comprehension in tact - able to make needs known/answer questions using hand gestures and word board. Expressive aphasia - makes sounds but not able to form words. PERRL  EOMs  without nystagmus or subjective diplopia. Visual fields grossly Right sided facial droop Right upper extremity weakness - able to lift at shoulder level only RLE/LLE full motor strength    Imaging: VAS [place] GROIN PSEUDOANEURYSM  Result Date: 02/02/2022  ARTERIAL PSEUDOANEURYSM  Patient Name:  TONNYA GARBETT Fullilove  Date of Exam:   02/02/2022 Medical Rec #: [id number]     Accession #:    [id number] Date of Birth: 16-Jun-1955     Patient Gender: F Patient Age:   66 years Exam Location:  [facility] Procedure:      VAS [place] Bobetta Lime Referring Phys: Alwyn Ren --------------------------------------------------------------------------------  Exam: Left groin Indications: Patient complains of bruising. History: S/p catheterization. Comparison Study: No prior study Performing Technologist: Gertie Fey MHA, RDMS, RVT, RDCS  Examination Guidelines: A complete evaluation includes B-mode imaging, spectral Doppler, color Doppler, and power Doppler as needed of all accessible portions of each vessel. Bilateral testing is considered an integral part of a complete examination. Limited examinations for reoccurring indications may be performed as noted. +-----------+----------+---------+------+----------+ Left DuplexPSV (cm/s)Waveform PlaqueComment(s) +-----------+----------+---------+------+----------+ Ext Iliac     166    triphasic                 +-----------+----------+---------+------+----------+ CFA            66    triphasic                 +-----------+----------+---------+------+----------+ PFA            73    triphasic                 +-----------+----------+---------+------+----------+ Prox SFA       77    triphasic                 +-----------+----------+---------+------+----------+ Left Vein comments: left CFV patent  Summary: No evidence of pseudoaneurysm, AVF or DVT. A mixed echogenic structure measuring approximately 1.0 cm x 0.9 cm is visualized at the left groin with ultrasound characteristics of a hematoma.     --------------------------------------------------------------------------------    Preliminary    DG Swallowing Func-Speech Pathology  Result Date: 01/30/2022 Table formatting from the original result was not included. Images from the original result were not included. Objective Swallowing Evaluation: Type of Study: MBS-Modified Barium Swallow Study  Patient Details Name: JAQUITA BESSIRE MRN: [id number] Date of Birth: 1955-10-03 Today's Date: 01/30/2022 Time: SLP Start Time (ACUTE ONLY): 1135 -SLP Stop Time (ACUTE  ONLY): 1200 SLP Time Calculation (min) (ACUTE ONLY): 25 min Past Medical History: Past Medical History: Diagnosis Date  Anxiety   Back pain   Discitis of lumbar region 11/16/2013  L3-4/notes 11/24/2013, arms, neck  Exertional asthma   GERD (gastroesophageal reflux disease)   Hypertension   Kidney stones   "have always passed them"  Neck pain   Osteomyelitis (HCC) 11/23/2013  osteomyelitis, discitis   Sleep concern   uses Prozac for sleep  Past Surgical History: Past Surgical History: Procedure Laterality Date  BREAST CYST EXCISION Left 2010  "polypectomy"  IR CT HEAD LTD  01/21/2022  IR CT HEAD LTD  01/28/2022  IR PERCUTANEOUS ART THROMBECTOMY/INFUSION INTRACRANIAL INC DIAG ANGIO  01/21/2022  IR PERCUTANEOUS ART THROMBECTOMY/INFUSION INTRACRANIAL INC DIAG ANGIO  01/28/2022  IR US GUIDE VASC ACCESS RIGHT  01/22/2022  PICC LINE PLACE PERIPHERAL ([facility] HX) Right   for use of Levaquin & Vancomycin, of note: she reports that she had a "flulike feeling"   RADIOLOGY WITH ANESTHESIA N/A  01/21/2022  Procedure: IR WITH ANESTHESIA;  Surgeon: Julieanne Cotton, MD;  Location: [facility] OR;  Service: Radiology;  Laterality: N/A;  RADIOLOGY WITH ANESTHESIA N/A 01/28/2022  Procedure: IR WITH ANESTHESIA;  Surgeon: Radiologist, Medication, MD;  Location: [facility] OR;  Service: Radiology;  Laterality: N/A;  TONSILLECTOMY AND ADENOIDECTOMY  1975  TUBAL LIGATION  1999 HPI: Patient is a 66 y.o. 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 On 01/25/22, patient began to have symptoms of dizziness followed by nausea and vomiting. Symptoms improved but then on 9/5 but she felt generally weak and was having trouble finding words. Husband observed right facial droop. She presented to [facility] ED as code stroke on 01/28/22; CT head without acute abnormality but CTA unable to be performed due to IV infiltration.  She was taken directly to IR for thrombectomy and intubated for this procedure, extubated in morning of 01/29/22. MRI brain showed new acute infarcts in left posterior frontal and anterior parietal cortex.  Subjective: awake, alert, husband and two daughters present in room  Recommendations for follow up therapy are one component of a multi-disciplinary discharge planning process, led by the attending physician.  Recommendations may be updated based on patient status, additional functional criteria and insurance authorization. Assessment / Plan / Recommendation   01/30/2022   1:15 PM Clinical Impressions Clinical Impression Pt demonstrates oropharygneal dysphagia with mild oral impairment secondary to right CN VII and XII weakness and potential right sided sensory changes. Pt has decreased labial seal on the right with anterior spillage, improved with cues to increase labial tension with straw or cup. There is also decreased bolus cohesion and mild lingual residue unless cued for an oral hold. Pt initiates swallow as the bolus arrives past the valleculae resulting in instances of flash penetration or sensed aspiration (consecutive straw sips). Pt improved with single sips and oral hold prior to swallow. There was questionable trace silent aspiration of light barium pooled in pyriforms; penetrated post swallow. However this finding is inconclusive and  likely negligible given pts sensation and ejection of mild aspirate in prior event. Recommend initiating nectar thick liquids and dys 3 solids to aid in pt relearning swallowing motor patterns with a slower, more manageable texture prior to advancement to thin in 1-2 days if successful. 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);Dysphagia, unspecified (R13.10) Impact on safety and function Mild aspiration risk     01/30/2022   1:15 PM Treatment Recommendations Treatment Recommendations Therapy as outlined in treatment plan below     01/30/2022   1:15 PM Prognosis Prognosis for Safe  Diet Advancement Good   01/30/2022   1:15 PM Diet Recommendations SLP Diet Recommendations Nectar thick liquid;Thin liquid;Dysphagia 3 (Mech soft) solids Liquid Administration via Cup;Straw Medication Administration Whole meds with puree Compensations Slow rate;Small sips/bites;Clear throat intermittently Postural Changes Remain semi-upright after after feeds/meals (Comment);Seated upright at 90 degrees     01/30/2022   1:15 PM Other Recommendations Oral Care Recommendations Oral care BID Other Recommendations Have oral suction available Follow Up Recommendations Acute inpatient rehab (3hours/day) Assistance recommended at discharge Frequent or constant Supervision/Assistance Functional Status Assessment Patient has had a recent decline in their functional status and demonstrates the ability to make significant improvements in function in a reasonable and predictable amount of time.   01/30/2022   1:15 PM Frequency and Duration  Speech Therapy Frequency (ACUTE ONLY)  min 2x/week Treatment Duration 2 weeks     01/30/2022   1:15 PM Oral Phase Oral Phase Impaired Oral - Thin Cup Decreased bolus cohesion Oral - Thin Straw Decreased bolus cohesion;Premature spillage;Lingual/palatal residue Oral - Puree WFL Oral - Regular Weak lingual manipulation;Lingual/palatal residue    01/30/2022   1:15 PM Pharyngeal Phase Pharyngeal Phase Impaired Pharyngeal- Nectar Cup Pharyngeal residue - pyriform Pharyngeal- Nectar Straw Pharyngeal residue - pyriform Pharyngeal- Thin Cup Pharyngeal residue - pyriform;Penetration/Aspiration during swallow;Penetration/Aspiration before swallow Pharyngeal Material enters airway, CONTACTS cords and not ejected out;Material enters airway, remains ABOVE vocal cords then ejected out;Material does not enter airway Pharyngeal- Thin Straw Delayed swallow initiation-pyriform sinuses;Penetration/Aspiration before swallow;Penetration/Apiration after swallow;Pharyngeal residue - pyriform;Trace aspiration Pharyngeal  Material enters airway, passes BELOW cords then ejected out;Material enters airway, CONTACTS cords and not ejected out;Material does not enter airway     No data to display    Herbie Baltimore, MA CCC-SLP Acute Rehabilitation Services Secure Chat Preferred Office [phone] Lynann Beaver 01/30/2022, 1:48 PM                      Labs:  CBC: Recent Labs    01/31/22 0415 02/01/22 0700 02/02/22 0445 02/03/22 0530  WBC 5.2 4.8 4.5 4.4  HGB 9.3* 9.4* 9.5* 9.7*  HCT 28.0* 28.1* 27.9* 28.6*  PLT 157 178 183 184    COAGS: Recent Labs    01/29/22 1025 01/29/22 1745 01/30/22 0143 01/30/22 1000 01/30/22 1344 01/31/22 0415 02/01/22 0700 02/02/22 0445 02/03/22 0530  INR  --   --   --   --    < > 1.2 1.6* 1.7* 2.1*  APTT 39* 58* 77* 72*  --   --   --   --   --    < > = values in this interval not displayed.    BMP: Recent Labs    01/30/22 1620 01/31/22 0415 02/01/22 0700 02/02/22 0445  NA 138 139 141 142  K 3.5 3.1* 3.4* 3.4*  CL 109 109 110 109  CO2 22 24 25 25   GLUCOSE 98 92 109* 112*  BUN <5* <5* <5* 6*  CALCIUM 8.6* 8.6* 8.6* 8.8*  CREATININE 0.56 0.61 0.66 0.57  GFRNONAA >60 >60 >60 >60    LIVER FUNCTION TESTS: Recent Labs    01/21/22 1445 01/28/22 1330  BILITOT 1.0 0.3  AST 19 22  ALT 16 18  ALKPHOS 47 45  PROT 6.8 6.4*  ALBUMIN 4.1 3.9    Assessment and Plan:  66 y/o F with history of recurrent left M2/MCA occlusion s/p thrombectomy 8/29 and 9/5 in NIR, also noted to have LV thrombus on 8/29. Currently receiving ASA 81 mg QD + Lovenox 60 mg Q12H + Warfarin (managed by pharmacy). Hgb 9.7, plt 184, INR 2.1 this morning.   Patient with significant left groin puncture site bruising/pain concerning for pseudoaneurysm, vascular US of left groin performed yesterday which did not show pseudoaneurysm however did not imaging findings consistent with hematoma. Reviewed US findings with patient and husband today who state understanding. We also reviewed that  hematomas of this size can take 4+ weeks to resolve, however this can be prolonged in patient's receiving anticoagulation. Patient and husband also state understanding of this.  Plan: - Apply pressure dressing to left groin site - Warm compress x 20 minutes every 2-3 hours PRN to left groin site for pain - Out of bed with assistance today (up in chair, ambulate as tolerated). When ambulating  firm pressure needs to be held the left groin site which was discussed with the patient and her RN - Repeat left groin [place] on 9/14 to assess for improvement (ordered), if further bruising/pain/bleeding recommend returning to bed rest and repeating US earlier.   NIR will continue to follow, please call with questions or concerns.  Electronically Signed: Joaquim Nam, PA-C 02/03/2022, 9:25 AM   I spent a total of 25 Minutes at the the patient's bedside AND on the patient's hospital floor or unit, greater than 50% of which was counseling/coordinating care for left groin hematoma.

## 2022-02-03 NOTE — Progress Notes (Signed)
STROKE TEAM PROGRESS NOTE   SUBJECTIVE (INTERVAL HISTORY) Husband is at the bedside.  Hematoma overnight at puncture site. Stable tthis am.   UA shows UTI and rocephin started.    INR is 2.1  Pharmacy following: on coumadin and lovenox $RemoveBef'60mg'DjDizJBlzo$  BID bridge.   OBJECTIVE Temp:  [98.1 F (36.7 C)-98.5 F (36.9 C)] 98.2 F (36.8 C) (09/11 1100) Pulse Rate:  [74-87] 74 (09/11 1100) Cardiac Rhythm: Normal sinus rhythm (09/11 0701) Resp:  [16] 16 (09/11 1100) BP: (115-128)/(78-91) 120/78 (09/11 1100) SpO2:  [98 %-99 %] 98 % (09/11 1100)  Recent Labs  Lab 01/28/22 1328 01/28/22 1747  GLUCAP 125* 103*    Recent Labs  Lab 01/30/22 1620 01/31/22 0415 02/01/22 0700 02/02/22 0445 02/03/22 0530  NA 138 139 141 142 140  K 3.5 3.1* 3.4* 3.4* 3.5  CL 109 109 110 109 109  CO2 $Re'22 24 25 25 25  'bVu$ GLUCOSE 98 92 109* 112* 108*  BUN <5* <5* <5* 6* 5*  CREATININE 0.56 0.61 0.66 0.57 0.59  CALCIUM 8.6* 8.6* 8.6* 8.8* 8.9  MG 1.9  --   --   --  2.1  PHOS 1.8*  --   --   --  4.1    Recent Labs  Lab 01/28/22 1330  AST 22  ALT 18  ALKPHOS 45  BILITOT 0.3  PROT 6.4*  ALBUMIN 3.9    Recent Labs  Lab 01/28/22 1330 01/28/22 1337 01/29/22 0300 01/30/22 0143 01/31/22 0415 02/01/22 0700 02/02/22 0445 02/03/22 0530  WBC 4.7  --  8.6 5.3 5.2 4.8 4.5 4.4  NEUTROABS 2.5  --  6.1  --   --   --   --   --   HGB 12.0   < > 11.2* 9.2* 9.3* 9.4* 9.5* 9.7*  HCT 35.5*   < > 33.3* 27.3* 28.0* 28.1* 27.9* 28.6*  MCV 88.5  --  87.6 86.4 87.0 86.7 86.1 86.4  PLT 171  --  173 143* 157 178 183 184   < > = values in this interval not displayed.    No results for input(s): "CKTOTAL", "CKMB", "CKMBINDEX", "TROPONINI" in the last 168 hours. Recent Labs    02/01/22 0700 02/02/22 0445 02/03/22 0530  LABPROT 18.8* 19.6* 23.4*  INR 1.6* 1.7* 2.1*    Recent Labs    02/02/22 1310  COLORURINE AMBER*  LABSPEC 1.020  PHURINE 7.0  GLUCOSEU NEGATIVE  HGBUR NEGATIVE  BILIRUBINUR NEGATIVE   KETONESUR NEGATIVE  PROTEINUR 30*  NITRITE POSITIVE*  LEUKOCYTESUR TRACE*        Component Value Date/Time   CHOL 223 (H) 01/22/2022 0351   TRIG 271 (H) 01/29/2022 0300   HDL 40 (L) 01/22/2022 0351   CHOLHDL 5.6 01/22/2022 0351   VLDL 33 01/22/2022 0351   LDLCALC 150 (H) 01/22/2022 0351   Lab Results  Component Value Date   HGBA1C 5.0 01/22/2022      Component Value Date/Time   LABOPIA NONE DETECTED 01/28/2022 1831   COCAINSCRNUR NONE DETECTED 01/28/2022 1831   LABBENZ NONE DETECTED 01/28/2022 1831   AMPHETMU NONE DETECTED 01/28/2022 1831   THCU POSITIVE (A) 01/28/2022 1831   LABBARB NONE DETECTED 01/28/2022 1831    Recent Labs  Lab 01/28/22 1330  ETH <10     I have personally reviewed the radiological images below and agree with the radiology interpretations.  VAS [place] GROIN PSEUDOANEURYSM  Result Date: 02/02/2022  ARTERIAL PSEUDOANEURYSM  Patient Name:  Monea H Allaire  Date of Exam:  02/02/2022 Medical Rec #: [id number]     Accession #:    [id number] Date of Birth: 28-May-1955     Patient Gender: F Patient Age:   40 years Exam Location:  [facility] Procedure:      VAS [place] GROIN PSEUDOANEURYSM Referring Phys: Soyla Dryer --------------------------------------------------------------------------------  Exam: Left groin Indications: Patient complains of bruising. History: S/p catheterization. Comparison Study: No prior study Performing Technologist: Maudry Mayhew MHA, RDMS, RVT, RDCS  Examination Guidelines: A complete evaluation includes B-mode imaging, spectral Doppler, color Doppler, and power Doppler as needed of all accessible portions of each vessel. Bilateral testing is considered an integral part of a complete examination. Limited examinations for reoccurring indications may be performed as noted. +-----------+----------+---------+------+----------+ Left DuplexPSV (cm/s)Waveform PlaqueComment(s) +-----------+----------+---------+------+----------+ Ext  Iliac     166    triphasic                 +-----------+----------+---------+------+----------+ CFA            66    triphasic                 +-----------+----------+---------+------+----------+ PFA            73    triphasic                 +-----------+----------+---------+------+----------+ Prox SFA       77    triphasic                 +-----------+----------+---------+------+----------+ Left Vein comments: left CFV patent  Summary: No evidence of pseudoaneurysm, AVF or DVT. A mixed echogenic structure measuring approximately 1.0 cm x 0.9 cm is visualized at the left groin with ultrasound characteristics of a hematoma.     --------------------------------------------------------------------------------    Preliminary    DG Swallowing Func-Speech Pathology  Result Date: 01/30/2022 Table formatting from the original result was not included. Images from the original result were not included. Objective Swallowing Evaluation: Type of Study: MBS-Modified Barium Swallow Study  Patient Details Name: MECHELLE PATES MRN: [id number] Date of Birth: [birth year] Today's Date: 01/30/2022 Time: SLP Start Time (ACUTE ONLY): 1135 -SLP Stop Time (ACUTE ONLY): 1200 SLP Time Calculation (min) (ACUTE ONLY): 25 min Past Medical History: Past Medical History: Diagnosis Date  Anxiety   Back pain   Discitis of lumbar region 11/16/2013  L3-4/notes 11/24/2013, arms, neck  Exertional asthma   GERD (gastroesophageal reflux disease)   Hypertension   Kidney stones   "have always passed them"  Neck pain   Osteomyelitis ([place]) 11/23/2013  osteomyelitis, discitis   Sleep concern   uses Prozac for sleep  Past Surgical History: Past Surgical History: Procedure Laterality Date  BREAST CYST EXCISION Left 2010  "polypectomy"  IR CT HEAD LTD  01/21/2022  IR CT HEAD LTD  01/28/2022  IR PERCUTANEOUS ART THROMBECTOMY/INFUSION INTRACRANIAL INC DIAG ANGIO  01/21/2022  IR PERCUTANEOUS ART THROMBECTOMY/INFUSION INTRACRANIAL INC DIAG ANGIO   01/28/2022  IR US GUIDE VASC ACCESS RIGHT  01/22/2022  PICC LINE PLACE PERIPHERAL ([place] HX) Right   for use of Levaquin & Vancomycin, of note: she reports that she had a "flulike feeling"   RADIOLOGY WITH ANESTHESIA N/A 01/21/2022  Procedure: IR WITH ANESTHESIA;  Surgeon: Luanne Bras, MD;  Location: [place];  Service: Radiology;  Laterality: N/A;  RADIOLOGY WITH ANESTHESIA N/A 01/28/2022  Procedure: IR WITH ANESTHESIA;  Surgeon: Radiologist, Medication, MD;  Location: [place];  Service: Radiology;  Laterality: N/A;  TONSILLECTOMY AND ADENOIDECTOMY  1975  TUBAL LIGATION  1999 HPI: Patient is a 66 y.o. 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 On 01/25/22, patient began to have symptoms of dizziness followed by nausea and vomiting. Symptoms improved but then on 9/5 but she felt generally weak and was having trouble finding words. Husband observed right facial droop. She [facility] ED as code stroke on 01/28/22; CT head without acute abnormality but CTA unable to be performed due to IV infiltration. She was taken directly to IR for thrombectomy and intubated for this procedure, extubated in morning of 01/29/22. MRI brain showed new acute infarcts in left posterior frontal and anterior parietal cortex.  Subjective: awake, alert, husband and two daughters present in room  Recommendations for follow up therapy are one component of a multi-disciplinary discharge planning process, led by the attending physician.  Recommendations may be updated based on patient status, additional functional criteria and insurance authorization. Assessment / Plan / Recommendation   01/30/2022   1:15 PM Clinical Impressions Clinical Impression Pt demonstrates oropharygneal dysphagia with mild oral impairment secondary to right CN VII and XII weakness and potential right sided  sensory changes. Pt has decreased labial seal on the right with anterior spillage, improved with cues to increase labial tension with straw or cup. There is also decreased bolus cohesion and mild lingual residue unless cued for an oral hold. Pt initiates swallow as the bolus arrives past the valleculae resulting in instances of flash penetration or sensed aspiration (consecutive straw sips). Pt improved with single sips and oral hold prior to swallow. There was questionable trace silent aspiration of light barium pooled in pyriforms; penetrated post swallow. However this finding is inconclusive and  likely negligible given pts sensation and ejection of mild aspirate in prior event. Recommend initiating nectar thick liquids and dys 3 solids to aid in pt relearning swallowing motor patterns with a slower, more manageable texture prior to advancement to thin in 1-2 days if successful. 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);Dysphagia, unspecified (R13.10) Impact on safety and function Mild aspiration risk     01/30/2022   1:15 PM Treatment Recommendations Treatment Recommendations Therapy as outlined in treatment plan below     01/30/2022   1:15 PM Prognosis Prognosis for Safe Diet Advancement Good   01/30/2022   1:15 PM Diet Recommendations SLP Diet Recommendations Nectar thick liquid;Thin liquid;Dysphagia 3 (Mech soft) solids Liquid Administration via Cup;Straw Medication Administration Whole meds with puree Compensations Slow rate;Small sips/bites;Clear throat intermittently Postural Changes Remain semi-upright after after feeds/meals (Comment);Seated upright at 90 degrees     01/30/2022   1:15 PM Other Recommendations Oral Care Recommendations Oral care BID Other Recommendations Have oral suction available Follow Up Recommendations Acute inpatient rehab (3hours/day) Assistance recommended at discharge Frequent or constant Supervision/Assistance Functional Status Assessment Patient has had a recent decline in their functional  status and demonstrates the ability to make significant improvements in function in a reasonable and predictable amount of time.   01/30/2022   1:15 PM Frequency and Duration  Speech Therapy Frequency (ACUTE ONLY) min 2x/week Treatment Duration 2 weeks     01/30/2022   1:15 PM Oral Phase Oral Phase Impaired Oral - Thin Cup Decreased bolus cohesion Oral - Thin Straw Decreased bolus cohesion;Premature spillage;Lingual/palatal residue Oral - Puree WFL Oral - Regular Weak lingual manipulation;Lingual/palatal residue    01/30/2022   1:15 PM Pharyngeal Phase  Pharyngeal Phase Impaired Pharyngeal- Nectar Cup Pharyngeal residue - pyriform Pharyngeal- Nectar Straw Pharyngeal residue - pyriform Pharyngeal- Thin Cup Pharyngeal residue - pyriform;Penetration/Aspiration during swallow;Penetration/Aspiration before swallow Pharyngeal Material enters airway, CONTACTS cords and not ejected out;Material enters airway, remains ABOVE vocal cords then ejected out;Material does not enter airway Pharyngeal- Thin Straw Delayed swallow initiation-pyriform sinuses;Penetration/Aspiration before swallow;Penetration/Apiration after swallow;Pharyngeal residue - pyriform;Trace aspiration Pharyngeal Material enters airway, passes BELOW cords then ejected out;Material enters airway, CONTACTS cords and not ejected out;Material does not enter airway     No data to display    Herbie Baltimore, MA CCC-SLP Acute Rehabilitation Services Secure Chat Preferred Office [phone] Lynann Beaver 01/30/2022, 1:48 PM                     IR PERCUTANEOUS ART THROMBECTOMY/INFUSION INTRACRANIAL INC DIAG ANGIO  Result Date: 01/30/2022 INDICATION: 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 EXAM: 1. EMERGENT LARGE VESSEL OCCLUSION THROMBOLYSIS (anterior CIRCULATION) COMPARISON:  None Available. MEDICATIONS: No antibiotic was administered within 1 hour of the procedure. ANESTHESIA/SEDATION: General anesthesia. CONTRAST:  Omnipaque 300 150 mL.  FLUOROSCOPY TIME:  Fluoroscopy Time: 39 minutes 12 seconds (1289 mGy). COMPLICATIONS: None immediate. TECHNIQUE: Following a full explanation of the procedure along with the potential associated complications, an informed witnessed consent was obtained. The risks of intracranial hemorrhage of 10%, worsening neurological deficit, ventilator dependency, death and inability to revascularize were all reviewed in detail with the patient's spouse. The patient was then put under general anesthesia by the Department of Anesthesiology at [facility]. The left groin was prepped and draped in the usual sterile fashion. Thereafter using modified Seldinger technique, transfemoral access into the left common femoral artery was obtained without difficulty. Over a 0.035 inch guidewire an 8 French 25 cm Pinnacle sheath was inserted. Through this, and also over a 0.035 inch guidewire a combination of a wrist Simmons 2 support catheter inside of an 087 balloon guide catheter was advanced to the aortic region, and selectively positioned initially in the left common carotid artery and then advanced to the distal cervical left ICA over an 035 inch guidewire with support catheter. The guidewire was removed. Good aspiration obtained hub of the balloon guide catheter. Arteriogram was then performed proximally from the common carotid artery then the distal left internal carotid artery. FINDINGS: The left common carotid arteriogram demonstrates the left external carotid artery and its major branches to be widely patent. The left internal carotid artery at bulb to the cranial skull base demonstrates wide patency. Wide patency is noted of the petrous, the cavernous and the supraclinoid segments. Opacification of the left posterior cerebral artery via the left posterior communicating artery is noted. The left middle cerebral artery M1 segment is widely patent. The angiographic occlusion of the superior division mid M2 segment is noted.  The left MCA inferior division opacifies into the capillary and venous phases. The left anterior cerebral artery opacifies into the capillary and venous phases. Transient cross-filling via the anterior communicating artery of the right anterior cerebral artery A2 segment and the A1 segment is evident. PROCEDURE: Over an Aristotle softip micro guidewire with a J configuration, a combination of a 132 cm Zoom aspiration catheter inside of which was a 160 cm Phenom microcatheter was advanced to the proximal left middle cerebral artery. Using a torque device, access was obtained with the micro guidewire through the occluded superior division into the proximal M3 segment followed by the microcatheter. The guidewire was removed. Good aspiration  was obtained from the hub of the microcatheter. A gentle contrast injection demonstrated slow antegrade flow of contrast distally. A 3 mm x 36 mm retrieval device was then advanced to the distal end of the microcatheter and deployed in the usual manner. At this time the Zoom aspiration catheter was advanced into the proximal superior division. With proximal flow arrest in the left internal carotid artery constant aspiration was applied at the hub of the Zoom aspiration catheter and the aspiration device 2 minutes of the balloon guide catheter with a 20 mL syringe. Combination of the retrieval, the catheter and the Zoom aspiration catheter was retrieved removed. Following reversal of flow arrest, control arteriogram with the balloon guide catheter in the left internal carotid artery demonstrated near complete opacification of the superior division with gradual improved flow into the M3 M4 segment of the superior division. Moderate spasm noted in the proximal superior division responded to 5 aliquotes of 25 mcg of nitroglycerin intra-arterially. Also noted was a small non flow limiting filling defect in a mid parietal branch of the inferior division in the distal M3 M4 region which  also gradually started clearing over a period of 20 minutes. Control arteriogram performed through the balloon guide catheter in the mid cervical left demonstrated near complete revascularization of the left MCA achieving a TICI 2C revascularization. A control arteriogram performed through the left common carotid artery demonstrated small dissection of the distal left internal carotid artery at the skull base medially without any flow limitation. The balloon guide catheter was removed. Diagnostic catheter arteriogram was then performed of the right common carotid artery and the right vertebral artery. These demonstrated the right external carotid artery and its major branches to be widely patent. Internal carotid artery at the bulb to the cranial skull base demonstrates wide patency. The petrous, the cavernous and the supraclinoid segments demonstrate wide patency as do the right middle cerebral artery and the right anterior cerebral artery into the capillary and venous phases. The right vertebral artery demonstrates wide patency. More distally, patency is maintained of the basilar junction in the right posterior-inferior cerebellar artery. The basilar artery, the posterior cerebral arteries, the superior cerebellar arteries and the anterior-inferior cerebellar arteries demonstrate patency into the capillary and venous phases. Unopacified blood was noted in the basilar artery from the contralateral left vertebral artery. An 8 French Angio-Seal closure device demonstrates hemostasis at the left groin puncture site. Distal pulses remained palpable in both feet unchanged. A CT of the brain demonstrated no hemorrhagic complications. The patient was left intubated due to poor responsiveness following reversal of the anesthesia. Patient was then transferred to the neuro ICU post revascularization care. IMPRESSION: Status post endovascular revascularization of occluded division of the left middle cerebral artery achieving  a TICI 2C revascularization of the left MCA distribution. Puncture to first pass approximately 36 mins. PLAN: Follow-up as per referring MD. Electronically Signed   By: Luanne Bras M.D.   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 EXAM: 1. EMERGENT LARGE VESSEL OCCLUSION THROMBOLYSIS (anterior CIRCULATION) COMPARISON:  None Available. MEDICATIONS: No antibiotic was administered within 1 hour of the procedure. ANESTHESIA/SEDATION: General anesthesia. CONTRAST:  Omnipaque 300 150 mL. FLUOROSCOPY TIME:  Fluoroscopy Time: 39 minutes 12 seconds (1289 mGy). COMPLICATIONS: None immediate. TECHNIQUE: Following a full explanation of the procedure along with the potential associated complications, an informed witnessed consent was obtained. The risks of intracranial hemorrhage of  10%, worsening neurological deficit, ventilator dependency, death and inability to revascularize were all reviewed in detail with the patient's spouse. The patient was then put under general anesthesia by the Department of Anesthesiology at [facility]. The left groin was prepped and draped in the usual sterile fashion. Thereafter using modified Seldinger technique, transfemoral access into the left common femoral artery was obtained without difficulty. Over a 0.035 inch guidewire an 8 French 25 cm Pinnacle sheath was inserted. Through this, and also over a 0.035 inch guidewire a combination of a wrist Simmons 2 support catheter inside of an 087 balloon guide catheter was advanced to the aortic region, and selectively positioned initially in the left common carotid artery and then advanced to the distal cervical left ICA over an 035 inch guidewire with support catheter. The guidewire was removed. Good aspiration obtained hub of the balloon guide catheter. Arteriogram was then performed proximally from the common carotid artery then the distal left  internal carotid artery. FINDINGS: The left common carotid arteriogram demonstrates the left external carotid artery and its major branches to be widely patent. The left internal carotid artery at bulb to the cranial skull base demonstrates wide patency. Wide patency is noted of the petrous, the cavernous and the supraclinoid segments. Opacification of the left posterior cerebral artery via the left posterior communicating artery is noted. The left middle cerebral artery M1 segment is widely patent. The angiographic occlusion of the superior division mid M2 segment is noted. The left MCA inferior division opacifies into the capillary and venous phases. The left anterior cerebral artery opacifies into the capillary and venous phases. Transient cross-filling via the anterior communicating artery of the right anterior cerebral artery A2 segment and the A1 segment is evident. PROCEDURE: Over an Aristotle softip micro guidewire with a J configuration, a combination of a 132 cm Zoom aspiration catheter inside of which was a 160 cm Phenom microcatheter was advanced to the proximal left middle cerebral artery. Using a torque device, access was obtained with the micro guidewire through the occluded superior division into the proximal M3 segment followed by the microcatheter. The guidewire was removed. Good aspiration was obtained from the hub of the microcatheter. A gentle contrast injection demonstrated slow antegrade flow of contrast distally. A 3 mm x 36 mm retrieval device was then advanced to the distal end of the microcatheter and deployed in the usual manner. At this time the Zoom aspiration catheter was advanced into the proximal superior division. With proximal flow arrest in the left internal carotid artery constant aspiration was applied at the hub of the Zoom aspiration catheter and the aspiration device 2 minutes of the balloon guide catheter with a 20 mL syringe. Combination of the retrieval, the catheter and  the Zoom aspiration catheter was retrieved removed. Following reversal of flow arrest, control arteriogram with the balloon guide catheter in the left internal carotid artery demonstrated near complete opacification of the superior division with gradual improved flow into the M3 M4 segment of the superior division. Moderate spasm noted in the proximal superior division responded to 5 aliquotes of 25 mcg of nitroglycerin intra-arterially. Also noted was a small non flow limiting filling defect in a mid parietal branch of the inferior division in the distal M3 M4 region which also gradually started clearing over a period of 20 minutes. Control arteriogram performed through the balloon guide catheter in the mid cervical left demonstrated near complete revascularization of the left MCA achieving a TICI 2C revascularization. A control arteriogram  performed through the left common carotid artery demonstrated small dissection of the distal left internal carotid artery at the skull base medially without any flow limitation. The balloon guide catheter was removed. Diagnostic catheter arteriogram was then performed of the right common carotid artery and the right vertebral artery. These demonstrated the right external carotid artery and its major branches to be widely patent. Internal carotid artery at the bulb to the cranial skull base demonstrates wide patency. The petrous, the cavernous and the supraclinoid segments demonstrate wide patency as do the right middle cerebral artery and the right anterior cerebral artery into the capillary and venous phases. The right vertebral artery demonstrates wide patency. More distally, patency is maintained of the basilar junction in the right posterior-inferior cerebellar artery. The basilar artery, the posterior cerebral arteries, the superior cerebellar arteries and the anterior-inferior cerebellar arteries demonstrate patency into the capillary and venous phases. Unopacified blood  was noted in the basilar artery from the contralateral left vertebral artery. An 8 French Angio-Seal closure device demonstrates hemostasis at the left groin puncture site. Distal pulses remained palpable in both feet unchanged. A CT of the brain demonstrated no hemorrhagic complications. The patient was left intubated due to poor responsiveness following reversal of the anesthesia. Patient was then transferred to the neuro ICU post revascularization care. IMPRESSION: Status post endovascular revascularization of occluded division of the left middle cerebral artery achieving a TICI 2C revascularization of the left MCA distribution. Puncture to first pass approximately 36 mins. PLAN: Follow-up as per referring MD. Electronically Signed   By: Luanne Bras M.D.   On: 01/30/2022 08:42   MR BRAIN WO CONTRAST  Result Date: 01/28/2022 CLINICAL DATA:  Right MCA stroke status post thrombectomy, follow-up EXAM: MRI HEAD WITHOUT CONTRAST MRA HEAD WITHOUT CONTRAST TECHNIQUE: Multiplanar, multi-echo pulse sequences of the brain and surrounding structures were acquired without intravenous contrast. Angiographic images of the Circle of Willis were acquired using MRA technique without intravenous contrast. COMPARISON:  01/22/2022 MRI head, no prior MRA, correlation is made with CTA head and neck 01/21/2022 FINDINGS: Brain: New foci of restricted diffusion with ADC correlate in the left posterior frontal and anterior parietal cortex (series 5, images 80 8-95). Redemonstrated focus of restricted diffusion with ADC correlate in the left insula (series 5, image 76 and series 6, image 26). Other previously noted foci of restricted diffusion are decreased in conspicuity and do not have persistent ADC correlates. No acute hemorrhage, mass, mass effect, or midline shift. No hydrocephalus or extra-axial collection. Punctate focus of hemosiderin deposition in the left posterior frontal lobe. Minimal T2 hyperintense signal in the  periventricular Dennie matter and pons, likely the sequela of mild chronic small vessel ischemic disease. Vascular: Normal arterial flow voids. Skull and upper cervical spine: Normal marrow signal. Sinuses/Orbits: Minimal mucosal thickening in the ethmoid air cells. The orbits are unremarkable. Other: The mastoids are well aerated. MRA HEAD FINDINGS Anterior circulation: Both internal carotid arteries are patent to the termini, without significant stenosis. A1 segments patent. Normal anterior communicating artery. Anterior cerebral arteries are patent to their distal aspects. No M1 stenosis or occlusion. Distal MCA branches perfused and symmetric.In particular, the left MCA branches appear perfused and without focal stenosis. Posterior circulation: Vertebral arteries patent to the vertebrobasilar junction without stenosis. Basilar patent to its distal aspect. Superior cerebellar arteries patent bilaterally. Patent P1 segments. PCAs perfused to their distal aspects without stenosis. Diminutive left posterior communicating artery. The right posterior communicating artery is not definitively visualized. Anatomic variants: None  significant IMPRESSION: 1. 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 2. No intracranial large vessel occlusion or significant stenosis. In particular, left MCA branches appear perfused and without focal stenosis. These results will be called to the ordering clinician or representative by the Radiologist Assistant, and communication documented in the PACS or [employer]. Electronically Signed   By: Merilyn Baba M.D.   On: 01/28/2022 23:40   MR ANGIO HEAD WO CONTRAST  Result Date: 01/28/2022 CLINICAL DATA:  Right MCA stroke status post thrombectomy, follow-up EXAM: MRI HEAD WITHOUT CONTRAST MRA HEAD WITHOUT CONTRAST TECHNIQUE: Multiplanar, multi-echo pulse sequences of the brain and surrounding structures were acquired without intravenous contrast.  Angiographic images of the Circle of Willis were acquired using MRA technique without intravenous contrast. COMPARISON:  01/22/2022 MRI head, no prior MRA, correlation is made with CTA head and neck 01/21/2022 FINDINGS: Brain: New foci of restricted diffusion with ADC correlate in the left posterior frontal and anterior parietal cortex (series 5, images 80 8-95). Redemonstrated focus of restricted diffusion with ADC correlate in the left insula (series 5, image 76 and series 6, image 26). Other previously noted foci of restricted diffusion are decreased in conspicuity and do not have persistent ADC correlates. No acute hemorrhage, mass, mass effect, or midline shift. No hydrocephalus or extra-axial collection. Punctate focus of hemosiderin deposition in the left posterior frontal lobe. Minimal T2 hyperintense signal in the periventricular Cadieux matter and pons, likely the sequela of mild chronic small vessel ischemic disease. Vascular: Normal arterial flow voids. Skull and upper cervical spine: Normal marrow signal. Sinuses/Orbits: Minimal mucosal thickening in the ethmoid air cells. The orbits are unremarkable. Other: The mastoids are well aerated. MRA HEAD FINDINGS Anterior circulation: Both internal carotid arteries are patent to the termini, without significant stenosis. A1 segments patent. Normal anterior communicating artery. Anterior cerebral arteries are patent to their distal aspects. No M1 stenosis or occlusion. Distal MCA branches perfused and symmetric.In particular, the left MCA branches appear perfused and without focal stenosis. Posterior circulation: Vertebral arteries patent to the vertebrobasilar junction without stenosis. Basilar patent to its distal aspect. Superior cerebellar arteries patent bilaterally. Patent P1 segments. PCAs perfused to their distal aspects without stenosis. Diminutive left posterior communicating artery. The right posterior communicating artery is not definitively  visualized. Anatomic variants: None significant IMPRESSION: 1. 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 2. No intracranial large vessel occlusion or significant stenosis. In particular, left MCA branches appear perfused and without focal stenosis. These results will be called to the ordering clinician or representative by the Radiologist Assistant, and communication documented in the PACS or [employer]. Electronically Signed   By: Merilyn Baba M.D.   On: 01/28/2022 23:40   [place] EKG SITE RITE  Result Date: 01/28/2022 If Site Rite image not attached, placement could not be confirmed due to current cardiac rhythm.  DG Abd 1 View  Result Date: 01/28/2022 CLINICAL DATA:  Evaluate OG tube placement EXAM: ABDOMEN - 1 VIEW COMPARISON:  None Available. FINDINGS: The OG tube terminates in the left upper quadrant, in the region of the stomach. IMPRESSION: The OG tube appears to terminate in the stomach. No other abnormalities. Electronically Signed   By: Dorise Bullion III M.D.   On: 01/28/2022 17:28   Portable Chest x-ray  Result Date: 01/28/2022 CLINICAL DATA:  [id number]. Encounter for ETT placement. EXAM: PORTABLE CHEST 1 VIEW COMPARISON:  Chest x-ray 01/26/2022, CT heart 01/23/2022 FINDINGS: Enteric tube  with tip terminating 3 cm above the carina. Slightly more prominent aortic arch and aortic knob likely due to positioning and AP portable technique. Otherwise the heart and mediastinal contours are unchanged normal limits. No focal consolidation. No pulmonary edema. No pleural effusion. No pneumothorax. No acute osseous abnormality. IMPRESSION: 1. Slightly more prominent aortic arch and aortic knob likely due to positioning and AP portable technique. Finding can likely be further evaluated on CT angio head and neck 01/28/2022. 2. No active disease. Electronically Signed   By: Iven Finn M.D.   On: 01/28/2022 17:21   CT HEAD CODE STROKE WO CONTRAST  Result Date:  01/28/2022 CLINICAL DATA:  Code stroke.  Neuro deficit, acute, stroke suspected EXAM: CT HEAD WITHOUT CONTRAST TECHNIQUE: Contiguous axial images were obtained from the base of the skull through the vertex without intravenous contrast. RADIATION DOSE REDUCTION: This exam was performed according to the departmental dose-optimization program which includes automated exposure control, adjustment of the mA and/or kV according to patient size and/or use of iterative reconstruction technique. COMPARISON:  CT head 01/21/2022. FINDINGS: Brain: No evidence of acute large vascular territory infarction, hemorrhage, hydrocephalus, extra-axial collection or mass lesion/mass effect. Vascular: No hyperdense vessel identified. Skull: No acute fracture. Sinuses/Orbits: Clear sinuses.  No acute orbital findings. Other: No mastoid effusions. ASPECTS [facility] Stroke Program Early CT Score) total score (0-10 with 10 being normal): 10. IMPRESSION: 1. No evidence of acute intracranial abnormality. 2. ASPECTS is 10. Code stroke imaging results were communicated on 01/28/2022 at 1:43 pm to provider Bhagat via secure text paging. Electronically Signed   By: Margaretha Sheffield M.D.   On: 01/28/2022 13:43   DG Chest 2 View  Result Date: 01/26/2022 CLINICAL DATA:  Chest and shoulder pain.  Recent MRI and CVA. EXAM: CHEST - 2 VIEW COMPARISON:  11/21/2021 FINDINGS: The lungs are clear without focal pneumonia, edema, pneumothorax or pleural effusion. The cardiopericardial silhouette is within normal limits for size. The visualized bony structures of the thorax are unremarkable. IMPRESSION: No active cardiopulmonary disease. Electronically Signed   By: Misty Stanley M.D.   On: 01/26/2022 12:03   CT CORONARY MORPH W/CTA COR W/SCORE W/CA W/CM &/OR WO/CM  Addendum Date: 01/23/2022   ADDENDUM REPORT: 01/23/2022 13:25 EXAM: OVER-READ INTERPRETATION  CT CHEST The following report is a limited chest CT over-read performed by radiologist Dr. Lindaann Slough [facility] on 01/23/2022. This over-read does not include interpretation of cardiac or coronary anatomy or pathology. The coronary CTA interpretation by the cardiologist is attached. COMPARISON:  Radiographs 11/21/2021. Coronary artery calcium score CT 10/04/2020 FINDINGS: Mediastinum/Nodes: No enlarged lymph nodes within the visualized mediastinum. Lungs/Pleura: There is no pleural effusion. The visualized lungs appear clear. Upper abdomen: No significant findings within the visualized upper abdomen. Musculoskeletal/Chest wall: No chest wall mass or suspicious osseous findings within the visualized chest. IMPRESSION: No significant extracardiac findings within the visualized chest. Electronically Signed   By: Richardean Sale M.D.   On: 01/23/2022 13:25   Result Date: 01/23/2022 HISTORY: Chest pain/anginal equiv, ECGs or troponins abnormal EXAM: Cardiac/Coronary CT TECHNIQUE: The patient was scanned on a [employer]. PROTOCOL: A 100 kV prospective scan was triggered in the descending thoracic aorta at 111 HU's. Axial non-contrast 3 mm slices were carried out through the heart. The data set was analyzed on a dedicated work station and scored using the Agatston method. Gantry rotation speed was 250 msecs and collimation was .6 mm. Heart rate was optimized medically and sl NTG was  given. The 3D data set was reconstructed in 5% intervals of the 35-75 % of the R-R cycle. Systolic and diastolic phases were analyzed on a dedicated work station using MPR, MIP and VRT modes. The patient received contrast. FINDINGS: Coronary calcium score: The patient's coronary artery calcium score is 0, which places the patient in the 0 percentile. Coronary arteries: Normal coronary origins.  Right dominance. Right Coronary Artery: Normal caliber vessel, gives rise to small PDA. No significant plaque or stenosis. Left Main Coronary Artery: Normal caliber vessel. No significant plaque or stenosis. Small  caliber ramus without significant plaque or stenosis. Left Anterior Descending Coronary Artery: Normal caliber vessel. Minimal noncalcified plaque with 1-24% stenosis. Gives rise to large first diagonal branch without significant stenosis. Distal LAD wraps apex Left Circumflex Artery: Normal caliber vessel. Very trivial noncalcified plaque without significant stenosis. Gives rise to large first and second, small third OM branches. Aorta: Normal size, 31 mm at the mid ascending aorta (level of the PA bifurcation) measured double oblique. Trivial aortic atherosclerosis. No dissection seen in visualized portions of the aorta. Aortic Valve: No calcifications. Trileaflet. Other findings: Normal pulmonary vein drainage into the left atrium. Normal left atrial appendage without a thrombus. Appendage is chicken wing type. Normal size of the pulmonary artery. Normal appearance of the pericardium. There is an apical LV thrombus, dimensions saves on PACS images. Maximum dimension 15.6 x 11.3 mm There is a small accessory left atrial segment on the cranial aspect of the left atrium. IMPRESSION: 1.  Very minimal nonobstructive CAD, CADRADS = 1. 2. Coronary calcium score of 0. This was 0 percentile for age and sex matched control. 3. Normal coronary origin with right dominance. 4. There is an apical LV thrombus, dimensions saves on PACS images. Maximum dimension 15.6 x 11.3 mm Findings reported to ordering provider. INTERPRETATION: CAD-RADS 1: Minimal non-obstructive CAD (0-24%). Consider non-atherosclerotic causes of chest pain. Consider preventive therapy and risk factor modification. Electronically Signed: By: Buford Dresser M.D. 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 She initially presented on 01/21/2022 with transient episode of aphasia followed sudden onset dizziness, nausea, vomiting, chest pain that radiated to upper back on Saturday. MRI brain showed small ischemic infarct of the frontal R[place]on matter on the left. Her troponins were elevated to over 300 and her echocardiogram showed apical hypokinesis and a left ventricular thrombus consistent with NSTEMI. Around 6 p.m. on 01/21/2022, patient developed worsening aphasia, NIHSS 6. Head CT was unremarkable. CT angiogram of the head and neck showed a left M2/M3-MCA occlusion. She was then transferred to our service for an emergency mechanical thrombectomy. EXAM: ULTRASOUND-GUIDED VASCULAR ACCESS DIAGNOSTIC CEREBRAL ANGIOGRAM MECHANICAL THROMBECTOMY FLAT PANEL HEAD CT COMPARISON:  CT/CT angiogram of the head and neck January 22, 2019 MEDICATIONS: No antibiotic was administered. ANESTHESIA/SEDATION: The procedure was performed under general anesthesia. CONTRAST:  75 mL of Omnipaque 300 milligram/mL. FLUOROSCOPY: Radiation Exposure Index (as provided by the fluoroscopic device): 563.1 mGy Kerma COMPLICATIONS: None immediate. TECHNIQUE: Informed written consent was obtained from the patient after a thorough discussion of the procedural risks, benefits and alternatives. All questions were addressed. Maximal Sterile Barrier Technique was utilized including caps, mask, sterile gowns, sterile gloves, sterile drape, hand hygiene and skin antiseptic. A timeout was  performed prior to the initiation of the procedure. The right groin was prepped and draped in the usual sterile fashion. Using a micropuncture kit and the modified Seldinger technique, access was gained to the right common femoral artery and an 8 French sheath was placed. Real-time ultrasound guidance was utilized for vascular access including the acquisition  of a permanent ultrasound image documenting patency of the accessed vessel. Under fluoroscopy, a Zoom 88 guide catheter was navigated over a 6 [place] VTK catheter and a 0.035" Terumo Glidewire into the aortic arch. The catheter was placed into the left common carotid artery and then advanced into the left internal carotid artery. The diagnostic catheter was removed. Frontal and lateral angiograms of the head were obtained. FINDINGS: 1. Normal caliber of the right common femoral artery, adequate for vascular access. 2. There is no occlusion of the distal left M2/MCA superior division branch extending to the M3 segment. PROCEDURE: Using biplane roadmap, a Vect 46 aspiration catheter was navigated over an Aristotle 24 microguidewire into the cavernous segment of the left ICA. The aspiration catheter was then advanced over the wire to the level of occlusion in the M2-M3 segment and connected to an aspiration pump. Continuous aspiration was performed for 2 minutes. The guide catheter was connected to a VacLok syringe. The aspiration catheter was subsequently removed under constant aspiration. The guide catheter was aspirated for debris. Left internal carotid artery angiograms with magnified frontal and lateral views of the head showed recanalization of the left MCA with slow flow in a few distal cortical branches (TICI 2C). No embolus to new territory. Flat panel CT of the head was obtained and post processed in a separate workstation with concurrent attending physician supervision. Selected images were sent to PACS. No evidence of hemorrhagic complication. Delayed left internal carotid artery angiogram showed persistent patency of the left MCA. Right common femoral artery angiogram was obtained in right anterior oblique view. The puncture is at the level of the common femoral artery. The artery has normal caliber, adequate for closure device. The sheath was exchanged over the wire for a Perclose prostyle which was  utilized for access closure. Immediate hemostasis was achieved. IMPRESSION: 1. Successful mechanical thrombectomy for treatment of a distal left M2/MCA occlusion with direct contact aspiration achieving complete recanalization (TICI 2C). 2. No thromboembolic or hemorrhagic complication. PLAN: Transfer to ICU for continued post stroke care. Electronically Signed   By: Pedro Earls M.D.   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 She initially presented on 01/21/2022 with transient episode of aphasia followed sudden onset dizziness, nausea, vomiting, chest pain that radiated to upper back on Saturday. MRI brain showed small ischemic infarct of the frontal Rekowski matter on the left. Her troponins were elevated to over 300 and her echocardiogram showed apical hypokinesis and a left ventricular thrombus consistent with NSTEMI. Around 6 p.m. on 01/21/2022, patient developed worsening aphasia, NIHSS 6. Head CT was unremarkable. CT angiogram of the head and neck showed a left M2/M3-MCA occlusion. She was then transferred to our service for an emergency mechanical thrombectomy. EXAM: ULTRASOUND-GUIDED VASCULAR ACCESS DIAGNOSTIC CEREBRAL ANGIOGRAM MECHANICAL THROMBECTOMY FLAT PANEL HEAD CT COMPARISON:  CT/CT angiogram of the head and neck January 22, 2019 MEDICATIONS: No antibiotic was administered. ANESTHESIA/SEDATION: The procedure  was performed under general anesthesia. CONTRAST:  75 mL of Omnipaque 300 milligram/mL. FLUOROSCOPY: Radiation Exposure Index (as provided by the fluoroscopic device): 559.7 mGy Kerma COMPLICATIONS: None immediate. TECHNIQUE: Informed written consent was obtained from  the patient after a thorough discussion of the procedural risks, benefits and alternatives. All questions were addressed. Maximal Sterile Barrier Technique was utilized including caps, mask, sterile gowns, sterile gloves, sterile drape, hand hygiene and skin antiseptic. A timeout was performed prior to the initiation of the procedure. The right groin was prepped and draped in the usual sterile fashion. Using a micropuncture kit and the modified Seldinger technique, access was gained to the right common femoral artery and an 8 French sheath was placed. Real-time ultrasound guidance was utilized for vascular access including the acquisition of a permanent ultrasound image documenting patency of the accessed vessel. Under fluoroscopy, a Zoom 88 guide catheter was navigated over a 6 [place] VTK catheter and a 0.035" Terumo Glidewire into the aortic arch. The catheter was placed into the left common carotid artery and then advanced into the left internal carotid artery. The diagnostic catheter was removed. Frontal and lateral angiograms of the head were obtained. FINDINGS: 1. Normal caliber of the right common femoral artery, adequate for vascular access. 2. There is no occlusion of the distal left M2/MCA superior division branch extending to the M3 segment. PROCEDURE: Using biplane roadmap, a Vect 46 aspiration catheter was navigated over an Aristotle 24 microguidewire into the cavernous segment of the left ICA. The aspiration catheter was then advanced over the wire to the level of occlusion in the M2-M3 segment and connected to an aspiration pump. Continuous aspiration was performed for 2 minutes. The guide catheter was connected to a VacLok syringe. The aspiration catheter was subsequently removed under constant aspiration. The guide catheter was aspirated for debris. Left internal carotid artery angiograms with magnified frontal and lateral views of the head showed recanalization of the left MCA with slow flow in a  few distal cortical branches (TICI 2C). No embolus to new territory. Flat panel CT of the head was obtained and post processed in a separate workstation with concurrent attending physician supervision. Selected images were sent to PACS. No evidence of hemorrhagic complication. Delayed left internal carotid artery angiogram showed persistent patency of the left MCA. Right common femoral artery angiogram was obtained in right anterior oblique view. The puncture is at the level of the common femoral artery. The artery has normal caliber, adequate for closure device. The sheath was exchanged over the wire for a Perclose prostyle which was utilized for access closure. Immediate hemostasis was achieved. IMPRESSION: 1. Successful mechanical thrombectomy for treatment of a distal left M2/MCA occlusion with direct contact aspiration achieving complete recanalization (TICI 2C). 2. No thromboembolic or hemorrhagic complication. PLAN: Transfer to ICU for continued post stroke care. Electronically Signed   By: Pedro Earls M.D.   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 She initially presented on 01/21/2022 with transient episode of aphasia followed sudden onset dizziness, nausea, vomiting, chest pain that radiated to upper back on Saturday. MRI brain showed small ischemic infarct of the frontal [place] matter on the left. Her troponins were elevated to over 300 and her echocardiogram showed apical hypokinesis and a  left ventricular thrombus consistent with NSTEMI. Around 6 p.m. on 01/21/2022, patient developed worsening aphasia, NIHSS 6. Head CT was unremarkable. CT angiogram of the  head and neck showed a left M2/M3-MCA occlusion. She was then transferred to our service for an emergency mechanical thrombectomy. EXAM: ULTRASOUND-GUIDED VASCULAR ACCESS DIAGNOSTIC CEREBRAL ANGIOGRAM MECHANICAL THROMBECTOMY FLAT PANEL HEAD CT COMPARISON:  CT/CT angiogram of the head and neck January 22, 2019 MEDICATIONS: No antibiotic was administered. ANESTHESIA/SEDATION: The procedure was performed under general anesthesia. CONTRAST:  75 mL of Omnipaque 300 milligram/mL. FLUOROSCOPY: Radiation Exposure Index (as provided by the fluoroscopic device): 749.4 mGy Kerma COMPLICATIONS: None immediate. TECHNIQUE: Informed written consent was obtained from the patient after a thorough discussion of the procedural risks, benefits and alternatives. All questions were addressed. Maximal Sterile Barrier Technique was utilized including caps, mask, sterile gowns, sterile gloves, sterile drape, hand hygiene and skin antiseptic. A timeout was performed prior to the initiation of the procedure. The right groin was prepped and draped in the usual sterile fashion. Using a micropuncture kit and the modified Seldinger technique, access was gained to the right common femoral artery and an 8 French sheath was placed. Real-time ultrasound guidance was utilized for vascular access including the acquisition of a permanent ultrasound image documenting patency of the accessed vessel. Under fluoroscopy, a Zoom 88 guide catheter was navigated over a 6 [place] VTK catheter and a 0.035" Terumo Glidewire into the aortic arch. The catheter was placed into the left common carotid artery and then advanced into the left internal carotid artery. The diagnostic catheter was removed. Frontal and lateral angiograms of the head were obtained. FINDINGS: 1. Normal caliber of the right common femoral artery, adequate for vascular access. 2. There is no occlusion of the distal left M2/MCA superior division branch extending to the M3 segment. PROCEDURE: Using  biplane roadmap, a Vect 46 aspiration catheter was navigated over an Aristotle 24 microguidewire into the cavernous segment of the left ICA. The aspiration catheter was then advanced over the wire to the level of occlusion in the M2-M3 segment and connected to an aspiration pump. Continuous aspiration was performed for 2 minutes. The guide catheter was connected to a VacLok syringe. The aspiration catheter was subsequently removed under constant aspiration. The guide catheter was aspirated for debris. Left internal carotid artery angiograms with magnified frontal and lateral views of the head showed recanalization of the left MCA with slow flow in a few distal cortical branches (TICI 2C). No embolus to new territory. Flat panel CT of the head was obtained and post processed in a separate workstation with concurrent attending physician supervision. Selected images were sent to PACS. No evidence of hemorrhagic complication. Delayed left internal carotid artery angiogram showed persistent patency of the left MCA. Right common femoral artery angiogram was obtained in right anterior oblique view. The puncture is at the level of the common femoral artery. The artery has normal caliber, adequate for closure device. The sheath was exchanged over the wire for a Perclose prostyle which was utilized for access closure. Immediate hemostasis was achieved. IMPRESSION: 1. Successful mechanical thrombectomy for treatment of a distal left M2/MCA occlusion with direct contact aspiration achieving complete recanalization (TICI 2C). 2. No thromboembolic or hemorrhagic complication. PLAN: Transfer to ICU for continued post stroke care. Electronically Signed   By: Pedro Earls M.D.   On: 01/22/2022 14:25   MR BRAIN WO CONTRAST  Result Date: 01/22/2022 CLINICAL DATA:  Follow-up stroke EXAM: MRI HEAD WITHOUT CONTRAST TECHNIQUE: Multiplanar,  multiecho pulse sequences of the brain and surrounding structures were obtained  without intravenous contrast. COMPARISON:  Brain MRI and CT/CTA head and neck 1 day prior FINDINGS: Brain: A punctate fusion of diffusion restriction in the left frontal lobe periventricular [place] matter is unchanged compared to the study from 1 day prior. Additional small foci of diffusion restriction in the left external capsule, insula, and frontal lobe cortex are new. There is no associated hemorrhage or mass effect. Parenchymal volume is normal. The ventricles are normal in size. Parenchymal signal is otherwise normal, with no significant burden of under vitamin chronic small vessel ischemic change. There is no mass lesion.  There is no mass effect or midline shift. Vascular: Normal flow voids. Skull and upper cervical spine: Normal marrow signal. Sinuses/Orbits: The paranasal sinuses are clear. The globes and orbits are unremarkable. Other: None. IMPRESSION: New punctate acute infarcts in the left external capsule, insula, and frontal lobe cortex, and unchanged additional punctate infarct in the left frontal lobe Inch matter. Electronically Signed   By: Valetta Mole M.D.   On: 01/22/2022 11:39   CT CEREBRAL PERFUSION W CONTRAST  Result Date: 01/21/2022 CLINICAL DATA:  Neuro deficit, acute, stroke suspected. EXAM: CT ANGIOGRAPHY HEAD AND NECK CT PERFUSION BRAIN TECHNIQUE: Multidetector CT imaging of the head and neck was performed using the standard protocol during bolus administration of intravenous contrast. Multiplanar CT image reconstructions and MIPs were obtained to evaluate the vascular anatomy. Carotid stenosis measurements (when applicable) are obtained utilizing NASCET criteria, using the distal internal carotid diameter as the denominator. Multiphase CT imaging of the brain was performed following IV bolus contrast injection. Subsequent parametric perfusion maps were calculated using RAPID software. RADIATION DOSE REDUCTION: This exam was performed according to the departmental dose-optimization  program which includes automated exposure control, adjustment of the mA and/or kV according to patient size and/or use of iterative reconstruction technique. CONTRAST:  116mL OMNIPAQUE IOHEXOL 350 MG/ML SOLN COMPARISON:  Brain MRI 01/21/2022. Noncontrast head CT performed earlier today 01/21/2022. FINDINGS: CTA NECK FINDINGS Aortic arch: Standard aortic branching. Mild atherosclerotic plaque within the visualized aortic arch and proximal major branch vessels of the neck. Streak and beam hardening artifact arising from a dense right-sided contrast bolus partially obscures the right subclavian artery. Within this limitation, there is no appreciable hemodynamically significant innominate or proximal subclavian artery stenosis. Right carotid system: CCA and ICA patent within the neck without stenosis. Mild atherosclerotic plaque about the carotid bifurcation. Left carotid system: CCA and ICA patent within the neck without stenosis. Minimal atherosclerotic plaque about the carotid bifurcation. Vertebral arteries: Vertebral arteries codominant and patent within the neck without stenosis. Nonstenotic atherosclerotic plaque at the origin of the right vertebral artery. Skeleton: Cervical spondylosis. No acute fracture or aggressive osseous lesion. Other neck: No neck mass or cervical lymphadenopathy. Upper chest: No consolidation within the imaged lung apices. Review of the MIP images confirms the above findings CTA HEAD FINDINGS Anterior circulation: The intracranial internal carotid arteries are patent. The M1 middle cerebral arteries are patent. Occluded mid M2 left MCA vessel (with distal reconstitution, likely due to collateral flow) (for instance as seen on series 15, image 15). The anterior cerebral arteries are patent. No intracranial aneurysm is identified. Posterior circulation: The intracranial vertebral arteries are patent. The basilar artery is patent. The posterior cerebral arteries are patent. Posterior  communicating arteries are diminutive or absent, bilaterally. Venous sinuses: Within the limitations of contrast timing, no convincing thrombus. Anatomic variants: As described. Review of the MIP  images confirms the above findings CT Brain Perfusion Findings: CBF (<30%) Volume: 78mL Perfusion (Tmax>6.0s) volume: 42mL Mismatch Volume: 24mL Infarction Location:None identified CTA head impression and CT perfusion head impression called by telephone at the time of interpretation on 01/21/2022 at 6:50 pm to provider MCNEILL [facility] , who verbally acknowledged these results. IMPRESSION: CTA neck: 1. The common carotid, internal carotid and vertebral arteries are patent within the neck without stenosis. Minimal non-stenotic atherosclerotic plaque about the carotid bifurcations and at the origin of the right vertebral artery. 2.  Aortic Atherosclerosis (ICD10-I70.0). CTA head: Occluded mid M2 left MCA vessel (with distal reconstitution, likely due to collateral flow). CT perfusion head: The perfusion software identifies a 7 mL region of critically hypoperfused parenchyma within the left insula/frontal lobe (utilizing the Tmax>6 seconds threshold). No core infarct is identified. Reported mismatch volume: 7 mL. Electronically Signed   By: Kellie Simmering D.O.   On: 01/21/2022 19:09   CT ANGIO HEAD NECK W WO CM (CODE STROKE)  Result Date: 01/21/2022 CLINICAL DATA:  Neuro deficit, acute, stroke suspected. EXAM: CT ANGIOGRAPHY HEAD AND NECK CT PERFUSION BRAIN TECHNIQUE: Multidetector CT imaging of the head and neck was performed using the standard protocol during bolus administration of intravenous contrast. Multiplanar CT image reconstructions and MIPs were obtained to evaluate the vascular anatomy. Carotid stenosis measurements (when applicable) are obtained utilizing NASCET criteria, using the distal internal carotid diameter as the denominator. Multiphase CT imaging of the brain was performed following IV bolus contrast  injection. Subsequent parametric perfusion maps were calculated using RAPID software. RADIATION DOSE REDUCTION: This exam was performed according to the departmental dose-optimization program which includes automated exposure control, adjustment of the mA and/or kV according to patient size and/or use of iterative reconstruction technique. CONTRAST:  173mL OMNIPAQUE IOHEXOL 350 MG/ML SOLN COMPARISON:  Brain MRI 01/21/2022. Noncontrast head CT performed earlier today 01/21/2022. FINDINGS: CTA NECK FINDINGS Aortic arch: Standard aortic branching. Mild atherosclerotic plaque within the visualized aortic arch and proximal major branch vessels of the neck. Streak and beam hardening artifact arising from a dense right-sided contrast bolus partially obscures the right subclavian artery. Within this limitation, there is no appreciable hemodynamically significant innominate or proximal subclavian artery stenosis. Right carotid system: CCA and ICA patent within the neck without stenosis. Mild atherosclerotic plaque about the carotid bifurcation. Left carotid system: CCA and ICA patent within the neck without stenosis. Minimal atherosclerotic plaque about the carotid bifurcation. Vertebral arteries: Vertebral arteries codominant and patent within the neck without stenosis. Nonstenotic atherosclerotic plaque at the origin of the right vertebral artery. Skeleton: Cervical spondylosis. No acute fracture or aggressive osseous lesion. Other neck: No neck mass or cervical lymphadenopathy. Upper chest: No consolidation within the imaged lung apices. Review of the MIP images confirms the above findings CTA HEAD FINDINGS Anterior circulation: The intracranial internal carotid arteries are patent. The M1 middle cerebral arteries are patent. Occluded mid M2 left MCA vessel (with distal reconstitution, likely due to collateral flow) (for instance as seen on series 15, image 15). The anterior cerebral arteries are patent. No intracranial  aneurysm is identified. Posterior circulation: The intracranial vertebral arteries are patent. The basilar artery is patent. The posterior cerebral arteries are patent. Posterior communicating arteries are diminutive or absent, bilaterally. Venous sinuses: Within the limitations of contrast timing, no convincing thrombus. Anatomic variants: As described. Review of the MIP images confirms the above findings CT Brain Perfusion Findings: CBF (<30%) Volume: 63mL Perfusion (Tmax>6.0s) volume: 49mL Mismatch Volume: 44mL Infarction Location:None identified  CTA head impression and CT perfusion head impression called by telephone at the time of interpretation on 01/21/2022 at 6:50 pm to provider MCNEILL [facility] , who verbally acknowledged these results. IMPRESSION: CTA neck: 1. The common carotid, internal carotid and vertebral arteries are patent within the neck without stenosis. Minimal non-stenotic atherosclerotic plaque about the carotid bifurcations and at the origin of the right vertebral artery. 2.  Aortic Atherosclerosis (ICD10-I70.0). CTA head: Occluded mid M2 left MCA vessel (with distal reconstitution, likely due to collateral flow). CT perfusion head: The perfusion software identifies a 7 mL region of critically hypoperfused parenchyma within the left insula/frontal lobe (utilizing the Tmax>6 seconds threshold). No core infarct is identified. Reported mismatch volume: 7 mL. Electronically Signed   By: Kellie Simmering D.O.   On: 01/21/2022 19:09   CT HEAD CODE STROKE WO CONTRAST  Result Date: 01/21/2022 CLINICAL DATA:  Code stroke. Neuro deficit, acute, stroke suspected. EXAM: CT HEAD WITHOUT CONTRAST TECHNIQUE: Contiguous axial images were obtained from the base of the skull through the vertex without intravenous contrast. RADIATION DOSE REDUCTION: This exam was performed according to the departmental dose-optimization program which includes automated exposure control, adjustment of the mA and/or kV according to  patient size and/or use of iterative reconstruction technique. COMPARISON:  Same-day brain MRI 01/21/2022. FINDINGS: Brain: No age advanced or lobar predominant parenchymal atrophy. A subcentimeter acute infarct within the left frontal lobe Down matter (adjacent to the left lateral ventricle frontal horn) is occult by CT and was better appreciated on the brain MRI performed earlier today. No CT evidence of interval acute infarct. Minimal chronic small-vessel image changes within the cerebral Maves matter and pons, better appreciated on the prior brain MRI. There is no acute intracranial hemorrhage. No extra-axial fluid collection. No evidence of an intracranial mass. No midline shift. Vascular: No hyperdense vessel. Skull: No fracture or aggressive osseous lesion. Sinuses/Orbits: No mass or acute finding within the imaged orbits. Trace mucosal thickening within the anterior left ethmoid air cells. ASPECTS ([place] Stroke Program Early CT Score) - Ganglionic level infarction (caudate, lentiform nuclei, internal capsule, insula, M1-M3 cortex): 7 - Supraganglionic infarction (M4-M6 cortex): 3 Total score (0-10 with 10 being normal): 10 These results were communicated to Dr. Leonel Ramsay at 6:27 pmon 8/29/2023by text page via the [facility] messaging system. IMPRESSION: A known subcentimeter acute infarct within the left frontal lobe Guerrieri matter is occult by CT, and was better appreciated on the brain MRI performed earlier today. No CT evidence of interval acute intracranial abnormality. Minimal chronic small-vessel ischemic changes within the cerebral [place] matter and pons. Electronically Signed   By: Kellie Simmering D.O.   On: 01/21/2022 18:27   ECHOCARDIOGRAM COMPLETE  Result Date: 01/21/2022    ECHOCARDIOGRAM REPORT   Patient Name:   MITZI LILJA Mullin Date of Exam: 01/21/2022 Medical Rec #:  [id number]    Height:       61.0 in Accession #:    [id number]   Weight:       171.0 lb Date of Birth:  Dec 26, 1955    BSA:          1.767  m Patient Age:    60 years     BP:           146/125 mmHg Patient Gender: F            HR:           72 bpm. Exam Location:  Inpatient Procedure: 2D Echo, Color Doppler and Cardiac  Doppler Indications:    Stroke i63.9  History:        Patient has no prior history of Echocardiogram examinations.                 Risk Factors:Hypertension.  Sonographer:    Raquel Sarna Senior RDCS Referring Phys: [place]  1. Left ventricular ejection fraction, by estimation, is 45 to 50%. The left ventricle has mildly decreased function. The left ventricle demonstrates regional wall motion abnormalities (apical hypokinesis with out true aneurysm and with presence of an LV thrombus 1.84 X 0.9 X 1.2 cm).  2. The mitral valve is grossly normal. No evidence of mitral valve regurgitation. No evidence of mitral stenosis.  3. The aortic valve is tricuspid. Aortic valve regurgitation is not visualized. No aortic stenosis is present.  4. Right ventricular systolic function is normal. The right ventricular size is normal. Tricuspid regurgitation signal is inadequate for assessing PA pressure.  5. The inferior vena cava is normal in size with greater than 50% respiratory variability, suggesting right atrial pressure of 3 mmHg. Comparison(s): No prior Echocardiogram. 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 Cardiology aware. FINDINGS  Left Ventricle: Left ventricular ejection fraction, by estimation, is 45 to 50%. The left ventricle has mildly decreased function. The left ventricle demonstrates regional wall motion abnormalities. The left ventricular internal cavity size was small. There is no left ventricular hypertrophy. Left ventricular diastolic parameters are consistent with Grade I diastolic dysfunction (impaired relaxation).  LV Wall Scoring: The apical lateral segment, apical septal segment, apical anterior segment, and apical inferior segment are hypokinetic. Right Ventricle: The  right ventricular size is normal. No increase in right ventricular wall thickness. Right ventricular systolic function is normal. Tricuspid regurgitation signal is inadequate for assessing PA pressure. Left Atrium: Left atrial size was normal in size. Right Atrium: Right atrial size was normal in size. Pericardium: There is no evidence of pericardial effusion. Mitral Valve: The mitral valve is grossly normal. No evidence of mitral valve regurgitation. No evidence of mitral valve stenosis. Tricuspid Valve: The tricuspid valve is normal in structure. Tricuspid valve regurgitation is not demonstrated. No evidence of tricuspid stenosis. Aortic Valve: The aortic valve is tricuspid. Aortic valve regurgitation is not visualized. No aortic stenosis is present. Pulmonic Valve: The pulmonic valve was normal in structure. Pulmonic valve regurgitation is not visualized. No evidence of pulmonic stenosis. Aorta: The aortic root and ascending aorta are structurally normal, with no evidence of dilitation. Venous: The inferior vena cava is normal in size with greater than 50% respiratory variability, suggesting right atrial pressure of 3 mmHg. IAS/Shunts: No atrial level shunt detected by color flow Doppler.  LEFT VENTRICLE PLAX 2D LVIDd:         3.60 cm     Diastology LVIDs:         2.50 cm     LV e' medial:    6.68 cm/s LV PW:         0.80 cm     LV E/e' medial:  6.5 LV IVS:        0.80 cm     LV e' lateral:   11.00 cm/s LVOT diam:     1.70 cm     LV E/e' lateral: 3.9 LV SV:         42 LV SV Index:   24 LVOT Area:     2.27 cm  LV Volumes (MOD) LV vol d, MOD A2C: 70.1 ml LV vol  d, MOD A4C: 46.6 ml LV vol s, MOD A2C: 37.3 ml LV vol s, MOD A4C: 23.3 ml LV SV MOD A2C:     32.8 ml LV SV MOD A4C:     46.6 ml LV SV MOD BP:      28.7 ml RIGHT VENTRICLE RV S prime:     9.32 cm/s TAPSE (M-mode): 1.7 cm LEFT ATRIUM             Index        RIGHT ATRIUM          Index LA diam:        2.70 cm 1.53 cm/m   RA Area:     9.52 cm LA Vol (A2C):    28.1 ml 15.90 ml/m  RA Volume:   20.50 ml 11.60 ml/m LA Vol (A4C):   26.3 ml 14.88 ml/m LA Biplane Vol: 28.8 ml 16.30 ml/m  AORTIC VALVE LVOT Vmax:   115.00 cm/s LVOT Vmean:  68.700 cm/s LVOT VTI:    0.185 m  AORTA Ao Root diam: 2.90 cm Ao Asc diam:  3.20 cm MITRAL VALVE MV Area (PHT): 3.11 cm    SHUNTS MV Decel Time: 244 msec    Systemic VTI:  0.18 m MV E velocity: 43.20 cm/s  Systemic Diam: 1.70 cm MV A velocity: 64.50 cm/s MV E/A ratio:  0.67 Rudean Haskell MD Electronically signed by Rudean Haskell MD Signature Date/Time: 01/21/2022/4:04:02 PM    Final    MR BRAIN WO CONTRAST  Result Date: 01/21/2022 CLINICAL DATA:  TIA.  Sudden onset of aphasia, now resolved. EXAM: MRI HEAD WITHOUT CONTRAST TECHNIQUE: Multiplanar, multiecho pulse sequences of the brain and surrounding structures were obtained without intravenous contrast. COMPARISON:  None Available. FINDINGS: Brain: Small acute infarct in the left frontal [place] matter. No hemorrhage, hydrocephalus, mass, or atrophy. Minimal chronic small vessel changes may be present in the pons. No prior infarct is seen. Vascular: Major flow voids are preserved Skull and upper cervical spine: Normal marrow signal. Degenerative facet spurring where covered in the cervical spine with mild C2-3 anterolisthesis. Sinuses/Orbits: Negative IMPRESSION: Small acute infarct in the left frontal [place] matter. Electronically Signed   By: Jorje Guild M.D.   On: 01/21/2022 12:32     PHYSICAL EXAM  Temp:  [98.1 F (36.7 C)-98.5 F (36.9 C)] 98.2 F (36.8 C) (09/11 1100) Pulse Rate:  [74-87] 74 (09/11 1100) Resp:  [16] 16 (09/11 1100) BP: (115-128)/(78-91) 120/78 (09/11 1100) SpO2:  [98 %-99 %] 98 % (09/11 1100)  General - Well nourished, well developed, in no apparent distress. Sitting up in bed.  Skin: left groin hematoma stable.  Neuro - awake, alert, eyes open, expressive aphasia, able to make sounds out but not meaningful words, following simple  command..  Not able to name and repeat. No gaze palsy, tracking bilaterally, blinking to visual threat bilaterally.  Right mild facial droop. Tongue protrusion difficulty. LUE 5/5, no drift, RUE 4-/5 proximal and 0/5 distally. Bilaterally LEs 5/5, no drift. Sensation symmetrical bilaterally subjectively, left FTN intact, gait not tested.    ASSESSMENT/PLAN Ms. AKIRAH STORCK is a 66 y.o. 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 No tPA given due to on Eliquis and recent stroke.    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 INR goal 2.5 Continue ASA 81 per cardiology. Ongoing aggressive stroke risk factor management Therapy recommendations:  CIR Disposition:  pending  Hx of stroke and LV thrombus 8/29 admitted for nausea vomiting dizziness chest pain and then developed right facial droop and aphasia.  Found to have troponin > 300, EF 45 to 50% with LV thrombus.  CTA coronary artery showed minimal CAD.  CT no acute abnormality.  CTA head and neck showed left M2 occlusion.  CTP 0/7.  Status post EVT with TICI2c.  MRI showed left frontal Freas matter punctate infarct.  Repeat  MRI showed left external capsule, insular cortex and frontal cortex punctate infarcts.  LDL 150, A1c 5.0.  UDS positive for THC.  Patient was put on heparin IV and aspirin and eventually discharged on Eliquis and aspirin as well as Crestor 20. Per family, patient has slight expressive aphasia since discharge, no physical deficit. Cardiology Dr. Johnsie Cancel on board, recommend coumadin with INR goal 2.5. on lovenox bridge.   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  Lupus - ANA and ds DNA negative Low K resolved. UTI: discussed with hospitalist. Started on rocephin today  Hospital day # 6    02/03/2022 1:26 PM

## 2022-02-03 NOTE — Progress Notes (Signed)
Inpatient Rehab Admissions Coordinator:  Saw pt, husband and daughter at bedside. Informed them that would be starting insurance authorization today. Started at 1456. Will continue to follow.   Wolfgang Phoenix, MS, CCC-SLP Admissions Coordinator [phone]

## 2022-02-03 NOTE — Progress Notes (Addendum)
Occupational Therapy Treatment Patient Details Name: Chloe Johnson MRN: [id number] DOB: 1955/07/21 Today's Date: 02/03/2022   History of present illness Pt is a 66 y.o. female who presented 01/28/22 with weakness and aphasia. S/p complete revascularization of occluded superior division of the left middle cerebral artery M2 segment with 1 pass with a 3 mm x 40 mm treatment retrieval device and proximal aspiration achieving aTICI 2C revascularization 9/5. ETT 9/5-9/6. MRI revealed new acute infarcts in the left posterior frontal and anterior parietal cortex. Of note, pt admitted 01/21/22-01/23/22 for small acute infarct in the left frontal [place] matter. PMH: HTN, cervical radiculopathy, hepatitis, osteomyelitis/discitis, lumbar stenosis with foot drop anxiety, GERD, insomnia   OT comments  Pt supine in bed and agreeable to OT session, family at bedside.  Pt reports tremor in R elbow overnight and this am, uncomfortable and asking for splint.  Patient demonstrating active R shoulder movement, but reports unable to move the rest of her arm.  Tone noted in forearm supinators, wrist flexors; PROM and prolonged stretch provided.  After this able to demonstrate gravity eliminated active movements of elbow flexion/extension, supination (minimal), and grasp.  Patient very excited, happy tears.  Provided soft elbow splint for overnight use (will check after 30 minutes on) to help prevent tremor to elbow, also educated on use of pressure at biceps tendon if tremor starts to calm it.  Pt voiced understanding.  Pt demonstrating great use of compensatory communication methods to communicate needs during session, highly recommend AIR at dc.   Returned to room to check R elbow soft splint, noted indentation in upper arm from splint.  Removed and educated pt NOT to wear until another splint check is completed (may benefit from washcloth in that area but will trial tomorrow).  Encouraged pressure to biceps tendon and ROM to  release tremor tonight.    Recommendations for follow up therapy are one component of a multi-disciplinary discharge planning process, led by the attending physician.  Recommendations may be updated based on patient status, additional functional criteria and insurance authorization.    Follow Up Recommendations  Acute inpatient rehab (3hours/day)    Assistance Recommended at Discharge Frequent or constant Supervision/Assistance  Patient can return home with the following  A little help with walking and/or transfers;A little help with bathing/dressing/bathroom;Assistance with cooking/housework;Direct supervision/assist for medications management;Direct supervision/assist for financial management;Assist for transportation;Help with stairs or ramp for entrance   Equipment Recommendations  Other (comment) (defer)    Recommendations for Other Services Rehab consult    Precautions / Restrictions Precautions Precautions: Other (comment) Precaution Comments: SBP goal < 160 Restrictions Weight Bearing Restrictions: No       Mobility Bed Mobility                    Transfers                         Balance                                           ADL either performed or assessed with clinical judgement   ADL                                         General ADL  Comments: session focused on RUE AROM, stretch    Extremity/Trunk Assessment Upper Extremity Assessment Upper Extremity Assessment: RUE deficits/detail RUE Deficits / Details: demonstrates active 3-/5 shoulder movement, gravity eliminated elbow flexion/extension.  Tone noted in forearm supinators and wrist flexors. RUE Sensation: decreased light touch;decreased proprioception RUE Coordination: decreased fine motor;decreased gross motor            Vision       Perception     Praxis      Cognition Arousal/Alertness: Awake/alert Behavior During Therapy: WFL  for tasks assessed/performed Overall Cognitive Status: Difficult to assess                                 General Comments: pt with expressive commuincation deficits, using commuincation board as needed to express needs but also able to voice several words including "thank you".        Exercises Exercises: Other exercises Other Exercises Other Exercises: PROM to UE from shoulder to hand, prolonged stretch to R forearm into supination and wrist extensors. Other Exercises: After PROM, engaged in active shoulder ROM (reports she has been able to do this), gravity eliminated elbow flexion/extension (with support at elbow) x 5, gravity eliminated supination/pronation x 5, and grasp x 2. Other Exercises: Educated on Self PROM of R forearm into supination.    Shoulder Instructions       General Comments Pt asking about splint to R elbow, reports tremors overnight for 3-4 hours and today for 1-2 hours, before it will calm down.  Patient educated on trying placement of pressure on biceps tendon with movement to relax tremor if it begins.  Issued soft splint for elbow to try at night time.    Pertinent Vitals/ Pain       Pain Assessment Pain Assessment: No/denies pain  Home Living                                          Prior Functioning/Environment              Frequency  Min 2X/week        Progress Toward Goals  OT Goals(current goals can now be found in the care plan section)  Progress towards OT goals: Not progressing toward goals - comment  Acute Rehab OT Goals Patient Stated Goal: unable to state OT Goal Formulation: With patient Time For Goal Achievement: 02/12/22 Potential to Achieve Goals: Good  Plan Discharge plan remains appropriate;Frequency remains appropriate    Co-evaluation                 AM-PAC OT "6 Clicks" Daily Activity     Outcome Measure   Help from another person eating meals?: A Little Help from  another person taking care of personal grooming?: A Little Help from another person toileting, which includes using toliet, bedpan, or urinal?: A Little Help from another person bathing (including washing, rinsing, drying)?: A Little Help from another person to put on and taking off regular upper body clothing?: A Little Help from another person to put on and taking off regular lower body clothing?: A Little 6 Click Score: 18    End of Session    OT Visit Diagnosis: Unsteadiness on feet (R26.81);Cognitive communication deficit (R41.841);Other abnormalities of gait and mobility (R26.89);Hemiplegia and hemiparesis Symptoms and signs involving cognitive functions:  Cerebral infarction Hemiplegia - Right/Left: Right Hemiplegia - dominant/non-dominant: Dominant Hemiplegia - caused by: Cerebral infarction   Activity Tolerance Patient tolerated treatment well   Patient Left in bed;with call bell/phone within reach;with family/visitor present   Nurse Communication Mobility status        Time: 6160-7371 OT Time Calculation (min): 26 min  Charges: OT General Charges $OT Visit: 1 Visit OT Treatments $Neuromuscular Re-education: 23-37 mins  Barry Brunner, OT Acute Rehabilitation Services Office [phone]   Chloe Johnson 02/03/2022, 1:04 PM

## 2022-02-03 NOTE — Consult Note (Addendum)
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  MRI revealed infarcts of the left posterior and frontal anterior parietal cortex thought to be embolic secondary to LV thrombus for which patient had been on Eliquis.  Eliquis and was switched to Coumadin with Lovenox bridge with INR goal of 2.5. -Per neurology  Urinary tract infection Acute.  Patient reported change in urine color but denied any dysuria or lower abdominal pain.  Urinalysis was positive for trace leukocytes, positive nitrites, rare bacteria, and 21-50 WBCs.  Patient has several allergies including penicillin and prior reaction to Keflex of rash.   -Check urine culture -Trial of Rocephin 1 g IV every 24 hours for total of 3 days.  Alternatives antibiotics if unable to tolerate include ciprofloxacin or Levaquin.  -Benadryl IV if needed for itching or allergy  History of stroke and LV thrombus -Continue Coumadin with Lovenox bridge to goal INR  Essential hypertension -Continue Entresto  Hyperlipidemia LDL was 150 on 8/29. -Goal LDL less than 70 -Continue Crestor  Continue meds for all other chronic conditions.   TRH will sign off at present, please call us again when needed.  HPI: Chloe Johnson is a 66 y.o. female with past medical history of hypertension, hyperlipidemia, cardiomyopathy, currently on Eliquis, L3/L4 osteomyelitis, lumbar stenosis with foot drop, and chronic back pain who presented with complaints of right arm weakness and aphasia.  She had just recently been hospitalized with MI complicated by LV thrombus and right MCA stroke s/p  thrombectomy 01/21/2022. MRI revealed new infarcts in the left posterior frontal and anterior parietal cortex. The was not a tPA candidate due to the recent stroke and being on Eliquis.  Patient was noted to have concern for gray-colored urine yesterday for which urinalysis was obtained which noted noted trace leukocytes, positive nitrites, rare bacteria, and 21-50 WBCs.  Denies having any recent fever, chest pain, shortness of breath, or leg swelling.  Review of Systems: As mentioned in the history of present illness. All other systems reviewed and are negative. 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/16/2013   L3-4/notes 11/24/2013, arms, neck   Exertional asthma    GERD (gastroesophageal reflux disease)    Hypertension    Kidney stones    "have always passed them"   Neck pain    Osteomyelitis (HCC) 11/23/2013   osteomyelitis, discitis    Sleep concern    uses Prozac for sleep    Past Surgical History:  Procedure Laterality Date   BREAST CYST EXCISION Left 2010   "polypectomy"   IR CT HEAD LTD  01/21/2022   IR CT HEAD LTD  01/28/2022   IR PERCUTANEOUS ART THROMBECTOMY/INFUSION INTRACRANIAL INC DIAG ANGIO  01/21/2022   IR PERCUTANEOUS ART THROMBECTOMY/INFUSION INTRACRANIAL INC DIAG ANGIO  01/28/2022   IR US GUIDE VASC ACCESS RIGHT  01/22/2022   PICC LINE PLACE PERIPHERAL ([facility] HX) Right    for use of Levaquin & Vancomycin, of note: she reports that she had a "flulike feeling"    RADIOLOGY WITH ANESTHESIA N/A 01/21/2022   Procedure: IR WITH ANESTHESIA;  Surgeon: Corliss Skains,  Simonne Maffucci, MD;  Location: [facility] OR;  Service: Radiology;  Laterality: N/A;   RADIOLOGY WITH ANESTHESIA N/A 01/28/2022   Procedure: IR WITH ANESTHESIA;  Surgeon: Radiologist, Medication, MD;  Location: [facility] OR;  Service: Radiology;  Laterality: N/A;   TONSILLECTOMY AND ADENOIDECTOMY  1975   TUBAL LIGATION  1999   Social History:  reports that she has never smoked. She has never  used smokeless tobacco. She reports current alcohol use of about 6.0 standard drinks of alcohol per week. She reports that she does not use drugs.  Allergies  Allergen Reactions   Doxycycline Nausea Only    malaise    Erythromycin Nausea And Vomiting   Iodine     unknown   Latex Other (See Comments)    Irritation of the skin   Shellfish Allergy Other (See Comments)    GI- Facial  Acne   Strawberry Extract Other (See Comments)    Mouth ulcers    Vancomycin    Vicodin [Hydrocodone-Acetaminophen] Other (See Comments)    Restless, tolerate percocet   Dilaudid [Hydromorphone Hcl] Rash    *Pt states she can take if given benadryl prior*   Keflex [Cephalexin] Rash   Penicillins Rash    Hives   Sulfa Antibiotics Rash    Hives    Family History  Problem Relation Age of Onset   Other Mother    Other Father    Hypertension Father     Prior to Admission medications   Medication Sig Start Date End Date Taking? Authorizing Provider  ALPRAZolam Prudy Feeler) 0.5 MG tablet Take 0.5 mg by mouth at bedtime. 12/23/21  Yes [provider]  apixaban (ELIQUIS) 5 MG TABS tablet Take 1 tablet (5 mg total) by mouth 2 (two) times daily. 01/23/22  Yes Karie Fetch, MD  cyclobenzaprine (FLEXERIL) 10 MG tablet Take 1 tablet (10 mg total) by mouth 3 (three) times daily as needed for muscle spasms. 11/17/14  Yes Tia Alert, MD  guaiFENesin (MUCINEX) 600 MG 12 hr tablet Take 600 mg by mouth daily as needed for cough or to loosen phlegm.   Yes [provider]  loratadine (CLARITIN) 10 MG tablet Take 10 mg by mouth every morning.   Yes [provider]  MYRBETRIQ 25 MG TB24 tablet Take 25 mg by mouth daily. 12/31/21  Yes [provider]  pregabalin (LYRICA) 25 MG capsule Take 1 capsule (25 mg total) by mouth 2 (two) times daily. 01/26/22 02/25/22 Yes Eber Hong, MD  rosuvastatin (CRESTOR) 20 MG tablet Take 1 tablet (20 mg total) by mouth daily. 01/24/22  Yes Karie Fetch,  MD  sacubitril-valsartan (ENTRESTO) 24-26 MG Take 1 tablet by mouth 2 (two) times daily. 01/23/22  Yes Karie Fetch, MD    Physical Exam: Vitals:   02/02/22 1711 02/02/22 2014 02/03/22 0521 02/03/22 0700  BP: 119/89 (!) 128/91 115/79 118/78  Pulse: 87 87 75 78  Resp: 16 16 16 16   Temp: 98.1 F (36.7 C) 98.3 F (36.8 C) 98.2 F (36.8 C) 98.5 F (36.9 C)  TempSrc: Oral Oral Oral Oral  SpO2: 98% 99% 99% 98%  Weight:      Height:       Exam  Constitutional: Older female who appears to be in no acute distress Eyes: PERRL, lids and conjunctivae normal ENMT: Mucous membranes are moist.    Neck: normal, supple, no masses, no thyromegaly Respiratory: clear to auscultation bilaterally, no wheezing, no crackles. Normal respiratory effort.   Cardiovascular: Regular rate and rhythm,  no murmurs / rubs / gallops. No extremity edema.  Abdomen: no tenderness, no masses palpated.  Bowel sounds positive.     Data Reviewed:   Reviewed urinalysis concerning for infection.   Family Communication: Family updated at bedside Primary team communication:   Thank you very much for involving [place] in the care of your patient.  Author: Clydie Braun, MD 02/03/2022 8:02 AM  For on call review [email or web address].

## 2022-02-03 NOTE — Progress Notes (Signed)
ANTICOAGULATION CONSULT NOTE  Pharmacy Consult for Enoxaparin and Warfarin Indication: LV thrombus  Allergies  Allergen Reactions   Doxycycline Nausea Only    malaise    Erythromycin Nausea And Vomiting   Iodine     unknown   Latex Other (See Comments)    Irritation of the skin   Shellfish Allergy Other (See Comments)    GI- Facial  Acne   Strawberry Extract Other (See Comments)    Mouth ulcers    Vancomycin    Vicodin [Hydrocodone-Acetaminophen] Other (See Comments)    Restless, tolerate percocet   Dilaudid [Hydromorphone Hcl] Rash    *Pt states she can take if given benadryl prior*   Keflex [Cephalexin] Rash   Penicillins Rash    Hives   Sulfa Antibiotics Rash    Hives    Patient Measurements: Height: 5\' 1"  (154.9 cm) Weight: 59 kg (130 lb 1.1 oz) IBW/kg (Calculated) : 47.8  Vital Signs: Temp: 98.5 F (36.9 C) (09/11 0700) Temp Source: Oral (09/11 0700) BP: 118/78 (09/11 0700) Pulse Rate: 78 (09/11 0700)  Labs: Recent Labs    02/01/22 0700 02/02/22 0445 02/02/22 0835 02/03/22 0530  HGB 9.4* 9.5*  --  9.7*  HCT 28.1* 27.9*  --  28.6*  PLT 178 183  --  184  LABPROT 18.8* 19.6*  --  23.4*  INR 1.6* 1.7*  --  2.1*  HEPRLOWMOCWT  --   --  1.00  --   CREATININE 0.66 0.57  --   --      Estimated Creatinine Clearance: 58.7 mL/min (by C-G formula based on SCr of 0.57 mg/dL).   Medical History: 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/16/2013   L3-4/notes 11/24/2013, arms, neck   Exertional asthma    GERD (gastroesophageal reflux disease)    Hypertension    Kidney stones    "have always passed them"   Neck pain    Osteomyelitis (HCC) 11/23/2013   osteomyelitis, discitis    Sleep concern    uses Prozac for sleep     Medications:  Medications Prior to Admission  Medication Sig Dispense Refill Last Dose   ALPRAZolam (XANAX) 0.5 MG tablet Take 0.5 mg by mouth at bedtime.   01/27/2022   apixaban  (ELIQUIS) 5 MG TABS tablet Take 1 tablet (5 mg total) by mouth 2 (two) times daily. 60 tablet 1 01/28/2022 at 0800   cyclobenzaprine (FLEXERIL) 10 MG tablet Take 1 tablet (10 mg total) by mouth 3 (three) times daily as needed for muscle spasms. 90 tablet 5 01/27/2022   guaiFENesin (MUCINEX) 600 MG 12 hr tablet Take 600 mg by mouth daily as needed for cough or to loosen phlegm.   Past Month   loratadine (CLARITIN) 10 MG tablet Take 10 mg by mouth every morning.   01/28/2022   MYRBETRIQ 25 MG TB24 tablet Take 25 mg by mouth daily.   01/28/2022   pregabalin (LYRICA) 25 MG capsule Take 1 capsule (25 mg total) by mouth 2 (two) times daily. 60 capsule 0 01/28/2022   rosuvastatin (CRESTOR) 20 MG tablet Take 1 tablet (20 mg total) by mouth daily. 30 tablet 1 01/28/2022   sacubitril-valsartan (ENTRESTO) 24-26 MG Take 1 tablet by mouth 2 (two) times daily. 60 tablet 1 01/28/2022   Scheduled:   ALPRAZolam  0.5 mg Oral QHS   aspirin EC  81 mg Oral Daily   Chlorhexidine Gluconate Cloth  6 each Topical Daily   docusate  100 mg Oral BID   enoxaparin (LOVENOX) injection  60 mg Subcutaneous Q12H   ferrous sulfate  325 mg Oral Q breakfast   loratadine  10 mg Oral Daily   polyethylene glycol  17 g Oral Daily   pregabalin  25 mg Oral BID   rosuvastatin  20 mg Oral Daily   sacubitril-valsartan  1 tablet Oral BID   sodium chloride flush  10-40 mL Intracatheter Q12H   Warfarin - Pharmacist Dosing Inpatient   Does not apply q1600    Assessment: 49 yoF with admitted for stroke s/p thrombectomy on 9/5. 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  Readmitted with recurrent CVA after 7 days of eliquis with compliance. Last Eliquis dose 9/5 @0800 . Pharmacy consulted for apixaban switch to warfarin with enoxaparin bridge.    INR 2.1 is therapeutic. L groin hematoma is stable. H/H 9s, plt stable. LMWH Anti-Xa level is therapeutic  Goal of Therapy:  INR 2.5-3  (2.5 per Dr. ) LMWH anti-Xa level goal 0.6-1 units/mL (BID regimen)   Monitor platelets by anticoagulation protocol: Yes   Plan:  Continue Enoxaparin 60mg  SQ BID until INR >/= 2.5 Warfarin 6 mg PO today x1 Check INR , CBC daily  F/U hematoma size  Cardiologist noted target INR 2.5 and he plans for patient follow up at coumadin clinic at [facility].    , PharmD, BCPS, BCCP Clinical Pharmacist  Please check AMION for all [facility] Pharmacy phone numbers After 10:00 PM, call Main Pharmacy [phone]

## 2022-02-03 NOTE — Progress Notes (Signed)
Speech Language Pathology Treatment: Cognitive-Linquistic  Patient Details Name: Chloe Johnson MRN: [id number] DOB: [birth year] Today's Date: 02/03/2022 Time: 5993-5701 SLP Time Calculation (min) (ACUTE ONLY): 58 min  Assessment / Plan / Recommendation Clinical Impression  Impressive session with Ms. Cousins today. Pt is speaking with words and phrases to express wants and needs, though intelligibility is poor due to severity of sound distortions. Pt improves listener comprehension by spelling on her letter board. Expresses complex language and ideas with this method though communication break downs are frequent.  SLP guided pt on improving articulation at the word level by reading 1-3 syllable words with initial /w/, /b/, /p/, /f/, /s/. Pt followed cues for placement, used her mirror for visual feedback (sometimes reaching for it independently), and was also aided with carrier phrases such as "rock the baby."  Pt has been practicing some nursery rhymes with her family and was able to approximate many of the sounds in [place] had a little lamb. We also practiced some of her responses to orientation questions What is your name' birthday age etc. By end of session pt was fluently responding to nutrition services with "My name is Chloe Johnson" when asked to identify herself for her meal. Pt was particularly struggling with /s/ and /f/ and was given two word lists to practice for our next session. Daughter and pt also instructed to require Selda to repeat a model of any word she spells on her board. Pt is highly stimulable and motivated. Could make enormous functional gains with intensive intervention. Continue to recommend AIR.    HPI HPI: Patient is a 66 y.o. 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 On 01/25/22, patient began to have symptoms of  dizziness followed by nausea and vomiting. Symptoms improved but then on 9/5 but she felt generally weak and was having trouble finding words. Husband observed right facial droop. She presented to [facility] ED as code stroke on 01/28/22; CT head without acute abnormality but CTA unable to be performed due to IV infiltration. She was taken directly to IR for thrombectomy and intubated for this procedure, extubated in morning of 01/29/22. MRI brain showed new acute infarcts in left posterior frontal and anterior parietal cortex.      SLP Plan  Continue with current plan of care      Recommendations for follow up therapy are one component of a multi-disciplinary discharge planning process, led by the attending physician.  Recommendations may be updated based on patient status, additional functional criteria and insurance authorization.    Recommendations                   Plan: Continue with current plan of care           Quenton Recendez, Riley Nearing  02/03/2022, 1:48 PM

## 2022-02-04 ENCOUNTER — Inpatient Hospital Stay (HOSPITAL_COMMUNITY)
Admission: RE | Admit: 2022-02-04 | Discharge: 2022-02-07 | DRG: 057 | Disposition: A | Discharge status: Home / Self Care | Payer: Medicare Other | Source: Intra-hospital | Attending: Physical Medicine and Rehabilitation | Admitting: Physical Medicine and Rehabilitation

## 2022-02-04 DIAGNOSIS — Z880 Allergy status to penicillin: Secondary | ICD-10-CM | POA: Diagnosis not present

## 2022-02-04 DIAGNOSIS — K219 Gastro-esophageal reflux disease without esophagitis: Secondary | ICD-10-CM | POA: Diagnosis present

## 2022-02-04 DIAGNOSIS — I5030 Unspecified diastolic (congestive) heart failure: Secondary | ICD-10-CM | POA: Diagnosis not present

## 2022-02-04 DIAGNOSIS — I69351 Hemiplegia and hemiparesis following cerebral infarction affecting right dominant side: Secondary | ICD-10-CM | POA: Diagnosis not present

## 2022-02-04 DIAGNOSIS — D649 Anemia, unspecified: Secondary | ICD-10-CM | POA: Diagnosis present

## 2022-02-04 DIAGNOSIS — I69392 Facial weakness following cerebral infarction: Secondary | ICD-10-CM | POA: Diagnosis not present

## 2022-02-04 DIAGNOSIS — I69320 Aphasia following cerebral infarction: Secondary | ICD-10-CM

## 2022-02-04 DIAGNOSIS — Z882 Allergy status to sulfonamides status: Secondary | ICD-10-CM | POA: Diagnosis not present

## 2022-02-04 DIAGNOSIS — Z881 Allergy status to other antibiotic agents status: Secondary | ICD-10-CM

## 2022-02-04 DIAGNOSIS — F419 Anxiety disorder, unspecified: Secondary | ICD-10-CM | POA: Diagnosis not present

## 2022-02-04 DIAGNOSIS — G4700 Insomnia, unspecified: Secondary | ICD-10-CM | POA: Diagnosis not present

## 2022-02-04 DIAGNOSIS — I110 Hypertensive heart disease with heart failure: Secondary | ICD-10-CM | POA: Diagnosis present

## 2022-02-04 DIAGNOSIS — Z8673 Personal history of transient ischemic attack (TIA), and cerebral infarction without residual deficits: Secondary | ICD-10-CM | POA: Diagnosis not present

## 2022-02-04 DIAGNOSIS — Z885 Allergy status to narcotic agent status: Secondary | ICD-10-CM | POA: Diagnosis not present

## 2022-02-04 DIAGNOSIS — Z91013 Allergy to seafood: Secondary | ICD-10-CM | POA: Diagnosis not present

## 2022-02-04 DIAGNOSIS — Z8249 Family history of ischemic heart disease and other diseases of the circulatory system: Secondary | ICD-10-CM | POA: Diagnosis not present

## 2022-02-04 DIAGNOSIS — I63512 Cerebral infarction due to unspecified occlusion or stenosis of left middle cerebral artery: Secondary | ICD-10-CM | POA: Diagnosis not present

## 2022-02-04 DIAGNOSIS — I10 Essential (primary) hypertension: Secondary | ICD-10-CM

## 2022-02-04 DIAGNOSIS — Z7901 Long term (current) use of anticoagulants: Secondary | ICD-10-CM

## 2022-02-04 DIAGNOSIS — Z91040 Latex allergy status: Secondary | ICD-10-CM

## 2022-02-04 DIAGNOSIS — Z91041 Radiographic dye allergy status: Secondary | ICD-10-CM | POA: Diagnosis not present

## 2022-02-04 DIAGNOSIS — Z79899 Other long term (current) drug therapy: Secondary | ICD-10-CM | POA: Diagnosis not present

## 2022-02-04 DIAGNOSIS — I639 Cerebral infarction, unspecified: Secondary | ICD-10-CM | POA: Diagnosis not present

## 2022-02-04 DIAGNOSIS — N390 Urinary tract infection, site not specified: Secondary | ICD-10-CM | POA: Diagnosis not present

## 2022-02-04 DIAGNOSIS — I509 Heart failure, unspecified: Secondary | ICD-10-CM | POA: Diagnosis present

## 2022-02-04 DIAGNOSIS — G8929 Other chronic pain: Secondary | ICD-10-CM | POA: Diagnosis not present

## 2022-02-04 DIAGNOSIS — I724 Aneurysm of artery of lower extremity: Secondary | ICD-10-CM | POA: Diagnosis not present

## 2022-02-04 DIAGNOSIS — E785 Hyperlipidemia, unspecified: Secondary | ICD-10-CM | POA: Diagnosis not present

## 2022-02-04 DIAGNOSIS — I6602 Occlusion and stenosis of left middle cerebral artery: Secondary | ICD-10-CM | POA: Diagnosis not present

## 2022-02-04 DIAGNOSIS — K5900 Constipation, unspecified: Secondary | ICD-10-CM | POA: Diagnosis present

## 2022-02-04 DIAGNOSIS — N3000 Acute cystitis without hematuria: Secondary | ICD-10-CM

## 2022-02-04 DIAGNOSIS — R531 Weakness: Secondary | ICD-10-CM | POA: Diagnosis not present

## 2022-02-04 DIAGNOSIS — J9600 Acute respiratory failure, unspecified whether with hypoxia or hypercapnia: Secondary | ICD-10-CM | POA: Diagnosis not present

## 2022-02-04 DIAGNOSIS — T148XXA Other injury of unspecified body region, initial encounter: Secondary | ICD-10-CM

## 2022-02-04 LAB — PROTIME-INR
INR: 3 — ABNORMAL HIGH (ref 0.8–1.2)
Prothrombin Time: 31.1 seconds — ABNORMAL HIGH (ref 11.4–15.2)

## 2022-02-04 MED ORDER — FERROUS SULFATE 325 (65 FE) MG PO TABS
325.0000 mg | ORAL_TABLET | Freq: Every day | ORAL | Status: DC
  Administered 2022-02-05 – 2022-02-07 (×3): 325 mg via ORAL
  Filled 2022-02-04 (×3): qty 1

## 2022-02-04 MED ORDER — FAMOTIDINE 20 MG PO TABS
20.0000 mg | ORAL_TABLET | ORAL | Status: AC
  Administered 2022-02-04: 20 mg via ORAL
  Filled 2022-02-04: qty 1

## 2022-02-04 MED ORDER — BLOOD PRESSURE CONTROL BOOK
Freq: Once | Status: AC
  Filled 2022-02-04: qty 1

## 2022-02-04 MED ORDER — ASPIRIN 81 MG PO TBEC: 81.0000 mg | DELAYED_RELEASE_TABLET | Freq: Every day | ORAL | 12 refills | Status: DC

## 2022-02-04 MED ORDER — METOPROLOL SUCCINATE ER 25 MG PO TB24
12.5000 mg | ORAL_TABLET | Freq: Every day | ORAL | Status: DC
  Administered 2022-02-05 – 2022-02-07 (×3): 12.5 mg via ORAL
  Filled 2022-02-04 (×3): qty 1

## 2022-02-04 MED ORDER — SACUBITRIL-VALSARTAN 24-26 MG PO TABS: 1.0000 | ORAL_TABLET | Freq: Two times a day (BID) | ORAL | Status: DC

## 2022-02-04 MED ORDER — FAMOTIDINE 20 MG PO TABS
20.0000 mg | ORAL_TABLET | Freq: Every day | ORAL | Status: DC
  Administered 2022-02-05 – 2022-02-07 (×3): 20 mg via ORAL
  Filled 2022-02-04 (×3): qty 1

## 2022-02-04 MED ORDER — BISACODYL 10 MG RE SUPP
10.0000 mg | Freq: Once | RECTAL | Status: AC
  Administered 2022-02-04: 10 mg via RECTAL
  Filled 2022-02-04: qty 1

## 2022-02-04 MED ORDER — ROSUVASTATIN CALCIUM 20 MG PO TABS
20.0000 mg | ORAL_TABLET | Freq: Every day | ORAL | Status: DC
  Administered 2022-02-05 – 2022-02-07 (×3): 20 mg via ORAL
  Filled 2022-02-04 (×3): qty 1

## 2022-02-04 MED ORDER — METOPROLOL SUCCINATE ER 25 MG PO TB24: 12.5000 mg | ORAL_TABLET | Freq: Every day | ORAL | Status: DC

## 2022-02-04 MED ORDER — ACETAMINOPHEN 160 MG/5ML PO SOLN
650.0000 mg | ORAL | Status: DC | PRN
  Administered 2022-02-06: 650 mg via ORAL

## 2022-02-04 MED ORDER — SACUBITRIL-VALSARTAN 24-26 MG PO TABS
1.0000 | ORAL_TABLET | Freq: Two times a day (BID) | ORAL | Status: DC
  Administered 2022-02-04 – 2022-02-06 (×4): 1 via ORAL
  Filled 2022-02-04 (×4): qty 1

## 2022-02-04 MED ORDER — WARFARIN - PHARMACIST DOSING INPATIENT: Freq: Every day | Status: DC

## 2022-02-04 MED ORDER — WARFARIN SODIUM 2 MG PO TABS: 2.0000 mg | ORAL_TABLET | Freq: Once | ORAL | Status: DC

## 2022-02-04 MED ORDER — LORATADINE 10 MG PO TABS: 10.0000 mg | ORAL_TABLET | Freq: Every day | ORAL | Status: AC

## 2022-02-04 MED ORDER — FERROUS SULFATE 325 (65 FE) MG PO TABS: 325.0000 mg | ORAL_TABLET | Freq: Every day | ORAL | 3 refills | Status: DC

## 2022-02-04 MED ORDER — DOCUSATE SODIUM 50 MG/5ML PO LIQD
100.0000 mg | Freq: Two times a day (BID) | ORAL | Status: DC
  Administered 2022-02-04 – 2022-02-07 (×6): 100 mg via ORAL
  Filled 2022-02-04 (×6): qty 10

## 2022-02-04 MED ORDER — ACETAMINOPHEN 325 MG PO TABS
650.0000 mg | ORAL_TABLET | ORAL | Status: DC | PRN
  Administered 2022-02-04 – 2022-02-07 (×4): 650 mg via ORAL
  Filled 2022-02-04 (×4): qty 2

## 2022-02-04 MED ORDER — DOCUSATE SODIUM 50 MG/5ML PO LIQD: 100.0000 mg | Freq: Two times a day (BID) | ORAL | 0 refills | Status: DC

## 2022-02-04 MED ORDER — DIPHENHYDRAMINE HCL 50 MG/ML IJ SOLN: 12.5000 mg | Freq: Four times a day (QID) | INTRAMUSCULAR | 0 refills | Status: DC | PRN

## 2022-02-04 MED ORDER — POLYETHYLENE GLYCOL 3350 17 G PO PACK: 17.0000 g | PACK | Freq: Every day | ORAL | 0 refills | Status: DC

## 2022-02-04 MED ORDER — PROCHLORPERAZINE MALEATE 10 MG PO TABS
10.0000 mg | ORAL_TABLET | Freq: Once | ORAL | Status: AC
  Administered 2022-02-04: 10 mg via ORAL
  Filled 2022-02-04: qty 1

## 2022-02-04 MED ORDER — ACETAMINOPHEN 325 MG PO TABS: 650.0000 mg | ORAL_TABLET | ORAL | Status: AC | PRN

## 2022-02-04 MED ORDER — POLYETHYLENE GLYCOL 3350 17 G PO PACK
17.0000 g | PACK | Freq: Every day | ORAL | Status: DC
  Administered 2022-02-05 – 2022-02-07 (×3): 17 g via ORAL
  Filled 2022-02-04 (×3): qty 1

## 2022-02-04 MED ORDER — SODIUM CHLORIDE 0.9 % IV SOLN: 1.0000 g | INTRAVENOUS | Status: DC

## 2022-02-04 MED ORDER — ASPIRIN 81 MG PO TBEC
81.0000 mg | DELAYED_RELEASE_TABLET | Freq: Every day | ORAL | Status: DC
  Administered 2022-02-05 – 2022-02-07 (×3): 81 mg via ORAL
  Filled 2022-02-04 (×3): qty 1

## 2022-02-04 MED ORDER — ALPRAZOLAM 0.5 MG PO TABS
0.5000 mg | ORAL_TABLET | Freq: Every day | ORAL | Status: DC
  Administered 2022-02-04: 0.5 mg via ORAL
  Filled 2022-02-04: qty 1

## 2022-02-04 MED ORDER — SODIUM CHLORIDE 0.9 % IV SOLN
1.0000 g | INTRAVENOUS | Status: DC
  Filled 2022-02-04: qty 10

## 2022-02-04 MED ORDER — ACETAMINOPHEN 650 MG RE SUPP: 650.0000 mg | RECTAL | Status: DC | PRN

## 2022-02-04 MED ORDER — METOPROLOL SUCCINATE ER 25 MG PO TB24
12.5000 mg | ORAL_TABLET | Freq: Every day | ORAL | Status: DC
  Administered 2022-02-04: 12.5 mg via ORAL
  Filled 2022-02-04: qty 1

## 2022-02-04 MED ORDER — LORATADINE 10 MG PO TABS
10.0000 mg | ORAL_TABLET | Freq: Every day | ORAL | Status: DC
  Administered 2022-02-05 – 2022-02-07 (×3): 10 mg via ORAL
  Filled 2022-02-04 (×3): qty 1

## 2022-02-04 MED ORDER — WARFARIN SODIUM 2 MG PO TABS
2.0000 mg | ORAL_TABLET | Freq: Once | ORAL | Status: AC
  Administered 2022-02-04: 2 mg via ORAL
  Filled 2022-02-04: qty 1

## 2022-02-04 MED ORDER — PREGABALIN 25 MG PO CAPS
25.0000 mg | ORAL_CAPSULE | Freq: Two times a day (BID) | ORAL | Status: DC
  Administered 2022-02-04 – 2022-02-05 (×2): 25 mg via ORAL
  Filled 2022-02-04 (×2): qty 1

## 2022-02-04 NOTE — Progress Notes (Signed)
ANTICOAGULATION CONSULT NOTE  Pharmacy Consult for Enoxaparin and Warfarin Indication: LV thrombus  Allergies  Allergen Reactions   Doxycycline Nausea Only    malaise    Erythromycin Nausea And Vomiting   Iodine     unknown   Latex Other (See Comments)    Irritation of the skin   Shellfish Allergy Other (See Comments)    GI- Facial  Acne   Strawberry Extract Other (See Comments)    Mouth ulcers    Vancomycin    Vicodin [Hydrocodone-Acetaminophen] Other (See Comments)    Restless, tolerate percocet   Dilaudid [Hydromorphone Hcl] Rash    *Pt states she can take if given benadryl prior*   Keflex [Cephalexin] Rash   Penicillins Rash    Hives   Sulfa Antibiotics Rash    Hives    Patient Measurements: Height: 5\' 1"  (154.9 cm) Weight: 59 kg (130 lb 1.1 oz) IBW/kg (Calculated) : 47.8  Vital Signs: Temp: 97.4 F (36.3 C) (09/12 0915) Temp Source: Oral (09/12 0915) BP: 107/71 (09/12 0915) Pulse Rate: 86 (09/12 0915)  Labs: Recent Labs    02/02/22 0445 02/02/22 0835 02/03/22 0530 02/04/22 0500  HGB 9.5*  --  9.7*  --   HCT 27.9*  --  28.6*  --   PLT 183  --  184  --   LABPROT 19.6*  --  23.4* 31.1*  INR 1.7*  --  2.1* 3.0*  HEPRLOWMOCWT  --  1.00  --   --   CREATININE 0.57  --  0.59  --      Estimated Creatinine Clearance: 58.7 mL/min (by C-G formula based on SCr of 0.59 mg/dL).   Medical History: 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/16/2013   L3-4/notes 11/24/2013, arms, neck   Exertional asthma    GERD (gastroesophageal reflux disease)    Hypertension    Kidney stones    "have always passed them"   Neck pain    Osteomyelitis (HCC) 11/23/2013   osteomyelitis, discitis    Sleep concern    uses Prozac for sleep     Medications:  Medications Prior to Admission  Medication Sig Dispense Refill Last Dose   ALPRAZolam (XANAX) 0.5 MG tablet Take 0.5 mg by mouth at bedtime.   01/27/2022   apixaban  (ELIQUIS) 5 MG TABS tablet Take 1 tablet (5 mg total) by mouth 2 (two) times daily. 60 tablet 1 01/28/2022 at 0800   cyclobenzaprine (FLEXERIL) 10 MG tablet Take 1 tablet (10 mg total) by mouth 3 (three) times daily as needed for muscle spasms. 90 tablet 5 01/27/2022   guaiFENesin (MUCINEX) 600 MG 12 hr tablet Take 600 mg by mouth daily as needed for cough or to loosen phlegm.   Past Month   loratadine (CLARITIN) 10 MG tablet Take 10 mg by mouth every morning.   01/28/2022   MYRBETRIQ 25 MG TB24 tablet Take 25 mg by mouth daily.   01/28/2022   pregabalin (LYRICA) 25 MG capsule Take 1 capsule (25 mg total) by mouth 2 (two) times daily. 60 capsule 0 01/28/2022   rosuvastatin (CRESTOR) 20 MG tablet Take 1 tablet (20 mg total) by mouth daily. 30 tablet 1 01/28/2022   sacubitril-valsartan (ENTRESTO) 24-26 MG Take 1 tablet by mouth 2 (two) times daily. 60 tablet 1 01/28/2022   Scheduled:   ALPRAZolam  0.5 mg Oral QHS   aspirin EC  81 mg Oral  Daily   Chlorhexidine Gluconate Cloth  6 each Topical Daily   docusate  100 mg Oral BID   ferrous sulfate  325 mg Oral Q breakfast   loratadine  10 mg Oral Daily   polyethylene glycol  17 g Oral Daily   pregabalin  25 mg Oral BID   prochlorperazine  10 mg Oral Once   rosuvastatin  20 mg Oral Daily   sacubitril-valsartan  1 tablet Oral BID   sodium chloride flush  10-40 mL Intracatheter Q12H   Warfarin - Pharmacist Dosing Inpatient   Does not apply q1600    Assessment: 89 yoF with admitted for stroke s/p thrombectomy on 9/5. 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  Readmitted with recurrent CVA after 7 days of eliquis with compliance. Last Eliquis dose 9/5 @0800 . Pharmacy consulted for apixaban switch to warfarin with enoxaparin bridge. Stop the Lovenox since INR goal was reach   INR 3 is therapeutic (INR is upper limit of goal). L groin hematoma is stable. H/H 9s, plt stable.   Based on average warfarin dose of 5.7 mg past 5 days and INR 3, and pt eating well recommend 2 mg  to stay within INR goal.  No DDI noted.   Goal of Therapy:  INR 2.5-3  (2.5 per Dr. ) Monitor platelets by anticoagulation protocol: Yes   Plan:  Warfarin 2 mg PO today x1 Check INR , CBC daily  F/U hematoma size  Cardiologist noted target INR 2.5 and he plans for patient follow up at coumadin clinic at [facility].    Chloe Johnson [facility]

## 2022-02-04 NOTE — Progress Notes (Signed)
A consult was placed to IV Therapy to dc the picc line; according to progress note,  pt is still receiving Rocephin for one more day;  spoke with pt and family member, as well as RN Tresa Endo), and recommend keeping the picc for one more day rather than having to stick the pt for one more dose of Rocephin;  Tresa Endo, RN to contact MDfor an order to keep the picc one more day.

## 2022-02-04 NOTE — TOC Transition Note (Signed)
Transition of Care [facility]) - CM/SW Discharge Note   Patient Details  Name: Chloe Johnson MRN: [id number] Date of Birth: 22-Nov-1955  [facility] CM/SW Contact:  Kermit Balo, RN Phone Number: 02/04/2022, 12:57 PM   Clinical Narrative:    Pt is discharging to CIR today. CM signing off.    Final next level of care: IP Rehab Facility Barriers to Discharge: No Barriers Identified   Patient Goals and CMS Choice   CMS Medicare.gov Compare Post Acute Care list provided to:: Patient Choice offered to / list presented to : Patient  Discharge Placement                       Discharge Plan and Services                                     Social Determinants of Health (SDOH) Interventions     Readmission Risk Interventions     No data to display

## 2022-02-04 NOTE — Hospital Course (Signed)
Chloe Johnson is a 66 y.o. female with past medical history of hypertension, hyperlipidemia, cardiomyopathy, on anticoagulation with Eliquis L3/L4 osteomyelitis, lumbar stenosis with foot drop, and chronic back pain who presented to hospital with right arm weakness and aphasia.  Patient was recently paralyzed for myocardial infarction complicated by LV thrombus and right right MCA stroke s/p thrombectomy 01/21/2022. MRI revealed new infarcts in the left posterior frontal and anterior parietal cortex.  Patient was not TPA candidate due to the recent stroke and being on Eliquis.  Patient was noted to have abnormal urinalysis so medical team was consulted.   Assessment and plan.  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  MRI showed infarcts in the left posterior and frontal anterior parietal cortex thought to be embolic secondary to LV thrombus for which patient had been on Eliquis.  Eliquis and was switched to Coumadin with Lovenox bridge with INR goal of 2.5.  Further management as per neurology.   Urinary tract infection Abnormal urinalysis which positive for trace leukocytes, positive nitrites, rare bacteria, and 21-50 WBCs.  Patient has several allergies including penicillin and prior reaction to Keflex of rash.  Follow urine culture.  Currently on Rocephin IV.  Alternatives antibiotics if unable to tolerate include ciprofloxacin or Levaquin.  Benadryl IV if needed for itching or allergy   History of stroke and LV thrombus Currently on Coumadin with Lovenox bridge to goal INR of 2-3.   Essential hypertension -Continue Entresto   Hyperlipidemia Continue Crestor

## 2022-02-04 NOTE — Progress Notes (Signed)
Inpatient Rehab Admissions Coordinator:  ° °I have a CIR bed for this Pt. Today. RN may call report to [phone]. ° °Laryssa Hassing, MS, CCC-SLP °Rehab Admissions Coordinator  °[phone] (celll) °[phone] (office) ° °

## 2022-02-04 NOTE — Progress Notes (Signed)
Occupational Therapy Treatment Patient Details Name: Chloe Johnson MRN: [id number] DOB: 01-13-1956 Today's Date: 02/04/2022   History of present illness Pt is a 67 y.o. female who presented 01/28/22 with weakness and aphasia. S/p complete revascularization of occluded superior division of the left middle cerebral artery M2 segment with 1 pass with a 3 mm x 40 mm treatment retrieval device and proximal aspiration achieving aTICI 2C revascularization 9/5. ETT 9/5-9/6. MRI revealed new acute infarcts in the left posterior frontal and anterior parietal cortex. Of note, pt admitted 01/21/22-01/23/22 for small acute infarct in the left frontal Pernice matter. PMH: HTN, cervical radiculopathy, hepatitis, osteomyelitis/discitis, lumbar stenosis with foot drop anxiety, GERD, insomnia   OT comments  Pt supine in bed and eager for OT.  Progressing well towards goals.  Increasing functional AROM of R UE, demonstrates full active elbow ROM today.  Flexor synergy tightness remains in elbow, forearm and wrist; but much improved since yesterday (pt reports stretching forearm), reports flexor tendon pressure assisted with releasing tremor yesterday (no splint needed).  Able to use R hand to brush hair with assist.  Plan for dc to rehab today.    Recommendations for follow up therapy are one component of a multi-disciplinary discharge planning process, led by the attending physician.  Recommendations may be updated based on patient status, additional functional criteria and insurance authorization.    Follow Up Recommendations  Acute inpatient rehab (3hours/day)    Assistance Recommended at Discharge Frequent or constant Supervision/Assistance  Patient can return home with the following  A little help with walking and/or transfers;A little help with bathing/dressing/bathroom;Assistance with cooking/housework;Direct supervision/assist for medications management;Direct supervision/assist for financial management;Assist for  transportation;Help with stairs or ramp for entrance   Equipment Recommendations  Other (comment) (defer)    Recommendations for Other Services Rehab consult    Precautions / Restrictions Precautions Precautions: Other (comment) Precaution Comments: SBP goal < 160 Restrictions Weight Bearing Restrictions: No       Mobility Bed Mobility Overal bed mobility: Modified Independent                  Transfers                         Balance Overall balance assessment: Needs assistance Sitting-balance support: No upper extremity supported, Feet supported Sitting balance-Leahy Scale: Normal     Standing balance support: No upper extremity supported, During functional activity Standing balance-Leahy Scale: Good                             ADL either performed or assessed with clinical judgement   ADL       Grooming: Minimal assistance;Standing Grooming Details (indicate cue type and reason): R hand over hand to secure grasp on brush to comb hair with min assist.                             Functional mobility during ADLs: Supervision/safety      Extremity/Trunk Assessment Upper Extremity Assessment Upper Extremity Assessment: RUE deficits/detail RUE Deficits / Details: active shoulder and elbow movement, 3/5 elbow flex/extension and 3- grasp.  Flexor synergy patterns emerging seen in elbow flexors, wrist RUE Sensation: decreased light touch;decreased proprioception RUE Coordination: decreased fine motor;decreased gross motor            Vision       Perception  Praxis      Cognition Arousal/Alertness: Awake/alert Behavior During Therapy: WFL for tasks assessed/performed                                   General Comments: expressive commuincation deficits, able to commuincate needs slowly using commuincation board as needed.  Follows commands well.        Exercises Other Exercises Other  Exercises: PROM to UE from shoulder to hand, prolonged stretch to R forearm into supination and wrist extensors. Other Exercises: After PROM, engaged in AROM exercises: x 5 shoulder retraction/protraction, shoulder flexion/extension, elbow flexion/extension; PROM sup/pronation, wrist Other Exercises: weightbearing through R hand/UE on sink x 5 reps    Shoulder Instructions       General Comments pt reports pressure to biceps tendon helped tremor last night. removed soft splint from room    Pertinent Vitals/ Pain       Pain Assessment Pain Assessment: No/denies pain  Home Living                                          Prior Functioning/Environment              Frequency  Min 2X/week        Progress Toward Goals  OT Goals(current goals can now be found in the care plan section)  Progress towards OT goals: Progressing toward goals  Acute Rehab OT Goals Patient Stated Goal: unable to state OT Goal Formulation: With patient Time For Goal Achievement: 02/12/22 Potential to Achieve Goals: Good  Plan Discharge plan remains appropriate;Frequency remains appropriate    Co-evaluation                 AM-PAC OT "6 Clicks" Daily Activity     Outcome Measure   Help from another person eating meals?: A Little Help from another person taking care of personal grooming?: A Little Help from another person toileting, which includes using toliet, bedpan, or urinal?: A Little Help from another person bathing (including washing, rinsing, drying)?: A Little Help from another person to put on and taking off regular upper body clothing?: A Little Help from another person to put on and taking off regular lower body clothing?: A Little 6 Click Score: 18    End of Session    OT Visit Diagnosis: Unsteadiness on feet (R26.81);Cognitive communication deficit (R41.841);Other abnormalities of gait and mobility (R26.89);Hemiplegia and hemiparesis Symptoms and signs  involving cognitive functions: Cerebral infarction Hemiplegia - Right/Left: Right Hemiplegia - dominant/non-dominant: Dominant Hemiplegia - caused by: Cerebral infarction   Activity Tolerance Patient tolerated treatment well   Patient Left in bed;with call bell/phone within reach;with family/visitor present   Nurse Communication Mobility status        Time: 1111-1130 OT Time Calculation (min): 19 min  Charges: OT General Charges $OT Visit: 1 Visit OT Treatments $Neuromuscular Re-education: 8-22 mins  Barry Brunner, OT Acute Rehabilitation Services Office [phone]   Chloe Johnson 02/04/2022, 11:47 AM

## 2022-02-04 NOTE — Progress Notes (Signed)
Inpatient Rehabilitation Admission Medication Review by a Pharmacist  A complete drug regimen review was completed for this patient to identify any potential clinically significant medication issues.  High Risk Drug Classes Is patient taking? Indication by Medication  Antipsychotic No   Anticoagulant Yes Warfarin - LV Thrombus and CVA   Antibiotic Yes, as intravenous  Ceftriaxone - UTI   Opioid No   Antiplatelet Yes Aspirin - CVA   Hypoglycemics/insulin No   Vasoactive Medication Yes Metoprolol and Entresto - HFrEF   Chemotherapy No   Other Yes Rosuvastatin - HLD  Alprazolam for sleep  Iron for anemia Pregabalin- neuropathy      Type of Medication Issue Identified Description of Issue Recommendation(s)  Drug Interaction(s) (clinically significant)     Duplicate Therapy     Allergy     No Medication Administration End Date     Incorrect Dose     Additional Drug Therapy Needed  Metoprolol succinate added 9/12 for HFrEF, not resumed on transfer to CIR Resumed for HFrEF   Significant med changes from prior encounter (inform family/care partners about these prior to discharge). Stop apixaban, cyclobenzaprine, guaifenesin  Review before discharge  Other       Clinically significant medication issues were identified that warrant physician communication and completion of prescribed/recommended actions by midnight of the next day:  No  Name of provider notified for urgent issues identified:   Provider Method of Notification:     Pharmacist comments:   Time spent performing this drug regimen review (minutes):  15  Alphia Moh, PharmD, [place], [facility] Clinical Pharmacist  Please check AMION for all [facility] phone numbers After 10:00 PM, call Main Pharmacy [phone]

## 2022-02-04 NOTE — Progress Notes (Signed)
Referring Physician(s): Dr. Otelia Limes  Supervising Physician: Julieanne Cotton  Patient Status:  [facility] - In-pt  Chief Complaint: Follow up left groin hematoma  Subjective: Patient sitting up in bed, her daughter is at the bedside. She is feeling better, speaking more and has plans to transfer to inpatient rehab today.   Allergies: Doxycycline, Erythromycin, Iodine, Latex, Shellfish allergy, Strawberry extract, Vancomycin, Vicodin [hydrocodone-acetaminophen], Dilaudid [hydromorphone hcl], Keflex [cephalexin], Penicillins, and Sulfa antibiotics  Medications: Prior to Admission medications   Medication Sig Start Date End Date Taking? Authorizing Provider  ALPRAZolam Prudy Feeler) 0.5 MG tablet Take 0.5 mg by mouth at bedtime. 12/23/21  Yes [provider]  apixaban (ELIQUIS) 5 MG TABS tablet Take 1 tablet (5 mg total) by mouth 2 (two) times daily. 01/23/22  Yes Karie Fetch, MD  cyclobenzaprine (FLEXERIL) 10 MG tablet Take 1 tablet (10 mg total) by mouth 3 (three) times daily as needed for muscle spasms. 11/17/14  Yes Tia Alert, MD  guaiFENesin (MUCINEX) 600 MG 12 hr tablet Take 600 mg by mouth daily as needed for cough or to loosen phlegm.   Yes [provider]  loratadine (CLARITIN) 10 MG tablet Take 10 mg by mouth every morning.   Yes [provider]  MYRBETRIQ 25 MG TB24 tablet Take 25 mg by mouth daily. 12/31/21  Yes [provider]  pregabalin (LYRICA) 25 MG capsule Take 1 capsule (25 mg total) by mouth 2 (two) times daily. 01/26/22 02/25/22 Yes Eber Hong, MD  rosuvastatin (CRESTOR) 20 MG tablet Take 1 tablet (20 mg total) by mouth daily. 01/24/22  Yes Karie Fetch, MD  sacubitril-valsartan (ENTRESTO) 24-26 MG Take 1 tablet by mouth 2 (two) times daily. 01/23/22  Yes Karie Fetch, MD  acetaminophen (TYLENOL) 325 MG tablet Take 2 tablets (650 mg total) by mouth every 4 (four) hours as needed for mild pain (or temp > 37.5 C (99.5 F)). 02/04/22    Elmer Picker, NP  aspirin EC 81 MG tablet Take 1 tablet (81 mg total) by mouth daily. Swallow whole. 02/05/22   Elmer Picker, NP  cefTRIAXone 1 g in sodium chloride 0.9 % 100 mL Inject 1 g into the vein daily. 02/05/22   Elmer Picker, NP  diphenhydrAMINE (BENADRYL) 50 MG/ML injection Inject 0.25 mLs (12.5 mg total) into the vein every 6 (six) hours as needed for itching or allergies. 02/04/22   Elmer Picker, NP  docusate (COLACE) 50 MG/5ML liquid Take 10 mLs (100 mg total) by mouth 2 (two) times daily. 02/04/22   Elmer Picker, NP  ferrous sulfate 325 (65 FE) MG tablet Take 1 tablet (325 mg total) by mouth daily with breakfast. 02/05/22   Elmer Picker, NP  loratadine (CLARITIN) 10 MG tablet Take 1 tablet (10 mg total) by mouth daily. 02/05/22   Elmer Picker, NP  metoprolol succinate (TOPROL-XL) 25 MG 24 hr tablet Take 0.5 tablets (12.5 mg total) by mouth daily. 02/05/22   Elmer Picker, NP  polyethylene glycol (MIRALAX / GLYCOLAX) 17 g packet Take 17 g by mouth daily. 02/05/22   Elmer Picker, NP  sacubitril-valsartan (ENTRESTO) 24-26 MG Take 1 tablet by mouth 2 (two) times daily. 02/04/22   Elmer Picker, NP  warfarin (COUMADIN) 2 MG tablet Take 1 tablet (2 mg total) by mouth one time only at 4 PM. 02/04/22   Elmer Picker, NP     Vital Signs: BP 107/71 (BP Location: Right Arm)   Pulse 86   Temp (!) 97.4 F (36.3 C) (Oral)  Resp 17   Ht 5\' 1"  (1.549 m)   Wt 130 lb 1.1 oz (59 kg)   SpO2 100%   BMI 24.58 kg/m   Physical Exam Constitutional:      General: She is not in acute distress.    Appearance: She is not ill-appearing.  Cardiovascular:     Comments: (+) left groin with diffuse bruising from upper thigh to lower abdomen. Small firm palpable area at previous puncture site, mildly tender to palpation. No active bleeding or drainage.  Pulmonary:     Effort: Pulmonary effort is normal.  Abdominal:     Palpations: Abdomen is soft.     Tenderness: There is no abdominal tenderness.   Skin:    General: Skin is warm and dry.  Neurological:     Mental Status: She is alert and oriented to person, place, and time.     Comments: Alert, awake Comprehension intact - able to make needs known/answer questions using hand gestures and word board. Expressive aphasia - makes sounds, able to form some words. Right sided facial droop Right upper extremity weakness - able to lift at shoulder level only RLE/LLE full motor strength     Imaging: VAS GROIN PSEUDOANEURYSM  Result Date: 02/03/2022  ARTERIAL PSEUDOANEURYSM  Patient Name:  Chloe Johnson  Date of Exam:   02/02/2022 Medical Rec #: 04/04/2022     Accession #:    [id number] Date of Birth: June 19, 1955     Patient Gender: F Patient Age:   66 years Exam Location:  [facility] Procedure:      VAS [facility] [place] Referring Phys: Bobetta Lime --------------------------------------------------------------------------------  Exam: Left groin Indications: Patient complains of bruising. History: S/p catheterization. Comparison Study: No prior study Performing Technologist: Alwyn Ren MHA, RDMS, RVT, RDCS  Examination Guidelines: A complete evaluation includes B-mode imaging, spectral Doppler, color Doppler, and power Doppler as needed of all accessible portions of each vessel. Bilateral testing is considered an integral part of a complete examination. Limited examinations for reoccurring indications may be performed as noted. +-----------+----------+---------+------+----------+ Left DuplexPSV (cm/s)Waveform PlaqueComment(s) +-----------+----------+---------+------+----------+ Ext Iliac     166    triphasic                 +-----------+----------+---------+------+----------+ CFA            66    triphasic                 +-----------+----------+---------+------+----------+ PFA            73    triphasic                 +-----------+----------+---------+------+----------+ Prox SFA       77     triphasic                 +-----------+----------+---------+------+----------+ Left Vein comments: left CFV patent  Summary: No evidence of pseudoaneurysm, AVF or DVT. A mixed echogenic structure measuring approximately 1.0 cm x 0.9 cm is visualized at the left groin with ultrasound characteristics of a hematoma.  Diagnosing physician: Gertie Fey MD Electronically signed by Sherald Hess MD on 02/03/2022 at 1:41:04 PM.    --------------------------------------------------------------------------------    Final     Labs:  CBC: Recent Labs    01/31/22 0415 02/01/22 0700 02/02/22 0445 02/03/22 0530  WBC 5.2 4.8 4.5 4.4  HGB 9.3* 9.4* 9.5* 9.7*  HCT 28.0* 28.1* 27.9* 28.6*  PLT 157 178 183 184    COAGS: Recent Labs  01/29/22 1025 01/29/22 1745 01/30/22 0143 01/30/22 1000 01/30/22 1344 02/01/22 0700 02/02/22 0445 02/03/22 0530 02/04/22 0500  INR  --   --   --   --    < > 1.6* 1.7* 2.1* 3.0*  APTT 39* 58* 77* 72*  --   --   --   --   --    < > = values in this interval not displayed.    BMP: Recent Labs    01/31/22 0415 02/01/22 0700 02/02/22 0445 02/03/22 0530  NA 139 141 142 140  K 3.1* 3.4* 3.4* 3.5  CL 109 110 109 109  CO2 24 25 25 25   GLUCOSE 92 109* 112* 108*  BUN <5* <5* 6* 5*  CALCIUM 8.6* 8.6* 8.8* 8.9  CREATININE 0.61 0.66 0.57 0.59  GFRNONAA >60 >60 >60 >60    LIVER FUNCTION TESTS: Recent Labs    01/21/22 1445 01/28/22 1330  BILITOT 1.0 0.3  AST 19 22  ALT 16 18  ALKPHOS 47 45  PROT 6.8 6.4*  ALBUMIN 4.1 3.9    Assessment and Plan:    66 y/o F with history of recurrent left M2/MCA occlusion s/p thrombectomy 8/29 and 9/5 in NIR, also noted to have LV thrombus on 8/29. Currently receiving ASA 81 mg QD + Lovenox 60 mg Q12H + Warfarin (managed by pharmacy).  Now with left groin hematoma. Hemoglobin 9.7 today.   Patient indicates that the pain around the left groin is decreased since yesterday. She appears happy/upbeat and excited  about going to inpatient rehab. Repeat vascular ultrasound ordered for Friday.   Patient encouraged to continue to apply firm pressure to the left groin site when ambulating.   NIR will continue to follow.   Electronically Signed: Soyla Dryer, AGACNP-BC [phone] 02/04/2022, 2:08 PM   I spent a total of 15 Minutes at the the patient's bedside AND on the patient's hospital floor or unit, greater than 50% of which was counseling/coordinating care for left groin hematoma

## 2022-02-04 NOTE — Progress Notes (Signed)
Speech Language Pathology Treatment: Cognitive-Linquistic  Patient Details Name: Chloe Johnson MRN: [id number] DOB: 06-24-1955 Today's Date: 02/04/2022 Time: 0086-7619 SLP Time Calculation (min) (ACUTE ONLY): 8 min  Assessment / Plan / Recommendation Clinical Impression  Pt reports increased labial numbness today, though neurologist also aware. Right labial asymmetry looks about the same as yesterday. Quick session today to target pts practice word list. Daughter reports pt still struggling with initial /s/. SLP gave pt a tactile cue to alveolar ridge and further placement cues, but pt only able to achieve /t/ /d/ and /sh/. SLP reminded pt that yesterday she was able to get a final /s/ (really /z/) in the phrase "My name is Chloe Johnson." Then shaped "is" into "is silly" with success (though pt not getting /l/). Encouraged pt and daughter to continue practicing and congratulated them on progress so far. Pt has excellent potential to improve.    HPI HPI: Patient is a 66 y.o. 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 On 01/25/22, patient began to have symptoms of dizziness followed by nausea and vomiting. Symptoms improved but then on 9/5 but she felt generally weak and was having trouble finding words. Husband observed right facial droop. She presented to [facility] ED as code stroke on 01/28/22; CT head without acute abnormality but CTA unable to be performed due to IV infiltration. She was taken directly to IR for thrombectomy and intubated for this procedure, extubated in morning of 01/29/22. MRI brain showed new acute infarcts in left posterior frontal and anterior parietal cortex.      SLP Plan  Continue with current plan of care      Recommendations for follow up therapy are one component of a multi-disciplinary discharge planning process, led  by the attending physician.  Recommendations may be updated based on patient status, additional functional criteria and insurance authorization.    Recommendations                   Plan: Continue with current plan of care           Chloe Johnson, Chloe Johnson  02/04/2022, 11:54 AM

## 2022-02-04 NOTE — Discharge Summary (Signed)
Stroke Discharge Summary  Patient ID: Chloe Johnson   MRN: [id number]      DOB: Jul 12, 1955  Date of Admission: 01/28/2022 Date of Discharge: 02/04/2022  Attending Physician:  Stroke, Md, MD, Stroke MD Consultant(s):   rehabilitation medicine Patient's PCP:  Charlane Ferretti, MD  Discharge Diagnoses:  Principal Problem:   Stroke determined by clinical assessment [facility]) Active Problems:   H/O ischemic left MCA stroke   Acute respiratory failure ([place])   Middle cerebral artery embolism, left   UTI (urinary tract infection)   Medications to be continued on Rehab Allergies as of 02/04/2022       Reactions   Doxycycline Nausea Only   malaise    Erythromycin Nausea And Vomiting   Iodine    unknown   Latex Other (See Comments)   Irritation of the skin   Shellfish Allergy Other (See Comments)   GI- Facial  Acne   Strawberry Extract Other (See Comments)   Mouth ulcers   Vancomycin    Vicodin [hydrocodone-acetaminophen] Other (See Comments)   Restless, tolerate percocet   Dilaudid [hydromorphone Hcl] Rash   *Pt states she can take if given benadryl prior*   Keflex [cephalexin] Rash   Penicillins Rash   Hives   Sulfa Antibiotics Rash   Hives        Medication List     STOP taking these medications    ALPRAZolam 0.5 MG tablet Commonly known as: XANAX   apixaban 5 MG Tabs tablet Commonly known as: ELIQUIS   cyclobenzaprine 10 MG tablet Commonly known as: FLEXERIL   guaiFENesin 600 MG 12 hr tablet Commonly known as: MUCINEX   pregabalin 25 MG capsule Commonly known as: Lyrica       TAKE these medications    acetaminophen 325 MG tablet Commonly known as: TYLENOL Take 2 tablets (650 mg total) by mouth every 4 (four) hours as needed for mild pain (or temp > 37.5 C (99.5 F)).   aspirin EC 81 MG tablet Take 1 tablet (81 mg total) by mouth daily. Swallow whole. Start taking on: February 05, 2022   cefTRIAXone 1 g in sodium chloride 0.9 % 100 mL Inject 1 g  into the vein daily. Start taking on: February 05, 2022   diphenhydrAMINE 50 MG/ML injection Commonly known as: BENADRYL Inject 0.25 mLs (12.5 mg total) into the vein every 6 (six) hours as needed for itching or allergies.   docusate 50 MG/5ML liquid Commonly known as: COLACE Take 10 mLs (100 mg total) by mouth 2 (two) times daily.   ferrous sulfate 325 (65 FE) MG tablet Take 1 tablet (325 mg total) by mouth daily with breakfast. Start taking on: February 05, 2022   loratadine 10 MG tablet Commonly known as: CLARITIN Take 1 tablet (10 mg total) by mouth daily. Start taking on: February 05, 2022 What changed: when to take this   Myrbetriq 25 MG Tb24 tablet Generic drug: mirabegron ER Take 25 mg by mouth daily.   polyethylene glycol 17 g packet Commonly known as: MIRALAX / GLYCOLAX Take 17 g by mouth daily. Start taking on: February 05, 2022   rosuvastatin 20 MG tablet Commonly known as: CRESTOR Take 1 tablet (20 mg total) by mouth daily.   sacubitril-valsartan 24-26 MG Commonly known as: ENTRESTO Take 1 tablet by mouth 2 (two) times daily.   warfarin 2 MG tablet Commonly known as: COUMADIN Take 1 tablet (2 mg total) by mouth one time only at  4 PM.        LABORATORY STUDIES CBC    Component Value Date/Time   WBC 4.4 02/03/2022 0530   RBC 3.31 (L) 02/03/2022 0530   HGB 9.7 (L) 02/03/2022 0530   HCT 28.6 (L) 02/03/2022 0530   PLT 184 02/03/2022 0530   MCV 86.4 02/03/2022 0530   MCH 29.3 02/03/2022 0530   MCHC 33.9 02/03/2022 0530   RDW 11.9 02/03/2022 0530   LYMPHSABS 1.7 01/29/2022 0300   MONOABS 0.6 01/29/2022 0300   EOSABS 0.1 01/29/2022 0300   BASOSABS 0.0 01/29/2022 0300   CMP    Component Value Date/Time   NA 140 02/03/2022 0530   K 3.5 02/03/2022 0530   CL 109 02/03/2022 0530   CO2 25 02/03/2022 0530   GLUCOSE 108 (H) 02/03/2022 0530   BUN 5 (L) 02/03/2022 0530   CREATININE 0.59 02/03/2022 0530   CALCIUM 8.9 02/03/2022 0530   PROT 6.4  (L) 01/28/2022 1330   ALBUMIN 3.9 01/28/2022 1330   AST 22 01/28/2022 1330   ALT 18 01/28/2022 1330   ALKPHOS 45 01/28/2022 1330   BILITOT 0.3 01/28/2022 1330   GFRNONAA >60 02/03/2022 0530   GFRAA >60 11/15/2014 1719   COAGS Lab Results  Component Value Date   INR 3.0 (H) 02/04/2022   INR 2.1 (H) 02/03/2022   INR 1.7 (H) 02/02/2022   Lipid Panel    Component Value Date/Time   CHOL 223 (H) 01/22/2022 0351   TRIG 271 (H) 01/29/2022 0300   HDL 40 (L) 01/22/2022 0351   CHOLHDL 5.6 01/22/2022 0351   VLDL 33 01/22/2022 0351   LDLCALC 150 (H) 01/22/2022 0351   HgbA1C  Lab Results  Component Value Date   HGBA1C 5.0 01/22/2022   Urinalysis    Component Value Date/Time   COLORURINE AMBER (A) 02/02/2022 1310   APPEARANCEUR HAZY (A) 02/02/2022 1310   LABSPEC 1.020 02/02/2022 1310   PHURINE 7.0 02/02/2022 1310   GLUCOSEU NEGATIVE 02/02/2022 1310   HGBUR NEGATIVE 02/02/2022 1310   BILIRUBINUR NEGATIVE 02/02/2022 1310   KETONESUR NEGATIVE 02/02/2022 1310   PROTEINUR 30 (A) 02/02/2022 1310   UROBILINOGEN 0.2 11/16/2013 2159   NITRITE POSITIVE (A) 02/02/2022 1310   LEUKOCYTESUR TRACE (A) 02/02/2022 1310   Urine Drug Screen     Component Value Date/Time   LABOPIA NONE DETECTED 01/28/2022 1831   COCAINSCRNUR NONE DETECTED 01/28/2022 1831   LABBENZ NONE DETECTED 01/28/2022 1831   AMPHETMU NONE DETECTED 01/28/2022 1831   THCU POSITIVE (A) 01/28/2022 1831   LABBARB NONE DETECTED 01/28/2022 1831    Alcohol Level    Component Value Date/Time   ETH <10 01/28/2022 1330     SIGNIFICANT DIAGNOSTIC STUDIES VAS [place] GROIN PSEUDOANEURYSM  Result Date: 02/03/2022  ARTERIAL PSEUDOANEURYSM  Patient Name:  Chloe Johnson  Date of Exam:   02/02/2022 Medical Rec #: [id number]     Accession #:    [id number] Date of Birth: [birth year]     Patient Gender: F Patient Age:   66 years Exam Location:  [facility] Procedure:      VAS [place] Gloriajean Dell Referring Phys: Soyla Dryer  --------------------------------------------------------------------------------  Exam: Left groin Indications: Patient complains of bruising. History: S/p catheterization. Comparison Study: No prior study Performing Technologist: Maudry Mayhew MHA, RDMS, RVT, RDCS  Examination Guidelines: A complete evaluation includes B-mode imaging, spectral Doppler, color Doppler, and power Doppler as needed of all accessible portions of each vessel. Bilateral testing is considered an integral part of  a complete examination. Limited examinations for reoccurring indications may be performed as noted. +-----------+----------+---------+------+----------+ Left DuplexPSV (cm/s)Waveform PlaqueComment(s) +-----------+----------+---------+------+----------+ Ext Iliac     166    triphasic                 +-----------+----------+---------+------+----------+ CFA            66    triphasic                 +-----------+----------+---------+------+----------+ PFA            73    triphasic                 +-----------+----------+---------+------+----------+ Prox SFA       77    triphasic                 +-----------+----------+---------+------+----------+ Left Vein comments: left CFV patent  Summary: No evidence of pseudoaneurysm, AVF or DVT. A mixed echogenic structure measuring approximately 1.0 cm x 0.9 cm is visualized at the left groin with ultrasound characteristics of a hematoma.  Diagnosing physician: Monica Martinez MD Electronically signed by Monica Martinez MD on 02/03/2022 at 1:41:04 PM.    --------------------------------------------------------------------------------    Final    DG Swallowing Func-Speech Pathology  Result Date: 01/30/2022 Table formatting from the original result was not included. Images from the original result were not included. Objective Swallowing Evaluation: Type of Study: MBS-Modified Barium Swallow Study  Patient Details Name: Chloe Johnson MRN: [id number] Date of  Birth: [birth year] Today's Date: 01/30/2022 Time: SLP Start Time (ACUTE ONLY): 1135 -SLP Stop Time (ACUTE ONLY): 1200 SLP Time Calculation (min) (ACUTE ONLY): 25 min Past Medical History: Past Medical History: Diagnosis Date  Anxiety   Back pain   Discitis of lumbar region 11/16/2013  L3-4/notes 11/24/2013, arms, neck  Exertional asthma   GERD (gastroesophageal reflux disease)   Hypertension   Kidney stones   "have always passed them"  Neck pain   Osteomyelitis ([place]) 11/23/2013  osteomyelitis, discitis   Sleep concern   uses Prozac for sleep  Past Surgical History: Past Surgical History: Procedure Laterality Date  BREAST CYST EXCISION Left 2010  "polypectomy"  IR CT HEAD LTD  01/21/2022  IR CT HEAD LTD  01/28/2022  IR PERCUTANEOUS ART THROMBECTOMY/INFUSION INTRACRANIAL INC DIAG ANGIO  01/21/2022  IR PERCUTANEOUS ART THROMBECTOMY/INFUSION INTRACRANIAL INC DIAG ANGIO  01/28/2022  IR US GUIDE VASC ACCESS RIGHT  01/22/2022  PICC LINE PLACE PERIPHERAL ([place] HX) Right   for use of Levaquin & Vancomycin, of note: she reports that she had a "flulike feeling"   RADIOLOGY WITH ANESTHESIA N/A 01/21/2022  Procedure: IR WITH ANESTHESIA;  Surgeon: Luanne Bras, MD;  Location: [place];  Service: Radiology;  Laterality: N/A;  RADIOLOGY WITH ANESTHESIA N/A 01/28/2022  Procedure: IR WITH ANESTHESIA;  Surgeon: Radiologist, Medication, MD;  Location: [place];  Service: Radiology;  Laterality: N/A;  TONSILLECTOMY AND ADENOIDECTOMY  1975  TUBAL LIGATION  1999 HPI: Patient is a 66 y.o. 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 On 01/25/22, patient began to have symptoms of dizziness followed by nausea and vomiting. Symptoms improved but then on 9/5 but she felt generally weak and was having trouble finding words. Husband observed right facial droop. She presented [facility] ED as code stroke  on 01/28/22; CT head without acute abnormality but CTA  unable to be performed due to IV infiltration. She was taken directly to IR for thrombectomy and intubated for this procedure, extubated in morning of 01/29/22. MRI brain showed new acute infarcts in left posterior frontal and anterior parietal cortex.  Subjective: awake, alert, husband and two daughters present in room  Recommendations for follow up therapy are one component of a multi-disciplinary discharge planning process, led by the attending physician.  Recommendations may be updated based on patient status, additional functional criteria and insurance authorization. Assessment / Plan / Recommendation   01/30/2022   1:15 PM Clinical Impressions Clinical Impression Pt demonstrates oropharygneal dysphagia with mild oral impairment secondary to right CN VII and XII weakness and potential right sided sensory changes. Pt has decreased labial seal on the right with anterior spillage, improved with cues to increase labial tension with straw or cup. There is also decreased bolus cohesion and mild lingual residue unless cued for an oral hold. Pt initiates swallow as the bolus arrives past the valleculae resulting in instances of flash penetration or sensed aspiration (consecutive straw sips). Pt improved with single sips and oral hold prior to swallow. There was questionable trace silent aspiration of light barium pooled in pyriforms; penetrated post swallow. However this finding is inconclusive and  likely negligible given pts sensation and ejection of mild aspirate in prior event. Recommend initiating nectar thick liquids and dys 3 solids to aid in pt relearning swallowing motor patterns with a slower, more manageable texture prior to advancement to thin in 1-2 days if successful. 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);Dysphagia, unspecified (R13.10) Impact on safety and function Mild aspiration risk     01/30/2022   1:15 PM Treatment Recommendations Treatment  Recommendations Therapy as outlined in treatment plan below     01/30/2022   1:15 PM Prognosis Prognosis for Safe Diet Advancement Good   01/30/2022   1:15 PM Diet Recommendations SLP Diet Recommendations Nectar thick liquid;Thin liquid;Dysphagia 3 (Mech soft) solids Liquid Administration via Cup;Straw Medication Administration Whole meds with puree Compensations Slow rate;Small sips/bites;Clear throat intermittently Postural Changes Remain semi-upright after after feeds/meals (Comment);Seated upright at 90 degrees     01/30/2022   1:15 PM Other Recommendations Oral Care Recommendations Oral care BID Other Recommendations Have oral suction available Follow Up Recommendations Acute inpatient rehab (3hours/day) Assistance recommended at discharge Frequent or constant Supervision/Assistance Functional Status Assessment Patient has had a recent decline in their functional status and demonstrates the ability to make significant improvements in function in a reasonable and predictable amount of time.   01/30/2022   1:15 PM Frequency and Duration  Speech Therapy Frequency (ACUTE ONLY) min 2x/week Treatment Duration 2 weeks     01/30/2022   1:15 PM Oral Phase Oral Phase Impaired Oral - Thin Cup Decreased bolus cohesion Oral - Thin Straw Decreased bolus cohesion;Premature spillage;Lingual/palatal residue Oral - Puree WFL Oral - Regular Weak lingual manipulation;Lingual/palatal residue    01/30/2022   1:15 PM Pharyngeal Phase Pharyngeal Phase Impaired Pharyngeal- Nectar Cup Pharyngeal residue - pyriform Pharyngeal- Nectar Straw Pharyngeal residue - pyriform Pharyngeal- Thin Cup Pharyngeal residue - pyriform;Penetration/Aspiration during swallow;Penetration/Aspiration before swallow Pharyngeal Material enters airway, CONTACTS cords and not ejected out;Material enters airway, remains ABOVE vocal cords then ejected out;Material does not enter airway Pharyngeal- Thin Straw Delayed swallow initiation-pyriform sinuses;Penetration/Aspiration  before swallow;Penetration/Apiration after swallow;Pharyngeal residue - pyriform;Trace aspiration Pharyngeal Material enters airway, passes BELOW cords then ejected out;Material enters airway, CONTACTS cords and not ejected out;Material does not enter airway     No data  to display    Herbie Baltimore, MA CCC-SLP Acute Rehabilitation Services Secure Chat Preferred Office [phone] Lynann Beaver 01/30/2022, 1:48 PM                     IR PERCUTANEOUS ART THROMBECTOMY/INFUSION INTRACRANIAL INC DIAG ANGIO  Result Date: 01/30/2022 INDICATION: 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 EXAM: 1. EMERGENT LARGE VESSEL OCCLUSION THROMBOLYSIS (anterior CIRCULATION) COMPARISON:  None Available. MEDICATIONS: No antibiotic was administered within 1 hour of the procedure. ANESTHESIA/SEDATION: General anesthesia. CONTRAST:  Omnipaque 300 150 mL. FLUOROSCOPY TIME:  Fluoroscopy Time: 39 minutes 12 seconds (1289 mGy). COMPLICATIONS: None immediate. TECHNIQUE: Following a full explanation of the procedure along with the potential associated complications, an informed witnessed consent was obtained. The risks of intracranial hemorrhage of 10%, worsening neurological deficit, ventilator dependency, death and inability to revascularize were all reviewed in detail with the patient's spouse. The patient was then put under general anesthesia by the Department of Anesthesiology at [facility]. The left groin was prepped and draped in the usual sterile fashion. Thereafter using modified Seldinger technique, transfemoral access into the left common femoral artery was obtained without difficulty. Over a 0.035 inch guidewire an 8 French 25 cm Pinnacle sheath was inserted. Through this, and also over a 0.035 inch guidewire a combination of a wrist Simmons 2 support catheter inside of an 087 balloon guide catheter was advanced to the aortic region, and selectively positioned initially in the left common carotid  artery and then advanced to the distal cervical left ICA over an 035 inch guidewire with support catheter. The guidewire was removed. Good aspiration obtained hub of the balloon guide catheter. Arteriogram was then performed proximally from the common carotid artery then the distal left internal carotid artery. FINDINGS: The left common carotid arteriogram demonstrates the left external carotid artery and its major branches to be widely patent. The left internal carotid artery at bulb to the cranial skull base demonstrates wide patency. Wide patency is noted of the petrous, the cavernous and the supraclinoid segments. Opacification of the left posterior cerebral artery via the left posterior communicating artery is noted. The left middle cerebral artery M1 segment is widely patent. The angiographic occlusion of the superior division mid M2 segment is noted. The left MCA inferior division opacifies into the capillary and venous phases. The left anterior cerebral artery opacifies into the capillary and venous phases. Transient cross-filling via the anterior communicating artery of the right anterior cerebral artery A2 segment and the A1 segment is evident. PROCEDURE: Over an Aristotle softip micro guidewire with a J configuration, a combination of a 132 cm Zoom aspiration catheter inside of which was a 160 cm Phenom microcatheter was advanced to the proximal left middle cerebral artery. Using a torque device, access was obtained with the micro guidewire through the occluded superior division into the proximal M3 segment followed by the microcatheter. The guidewire was removed. Good aspiration was obtained from the hub of the microcatheter. A gentle contrast injection demonstrated slow antegrade flow of contrast distally. A 3 mm x 36 mm retrieval device was then advanced to the distal end of the microcatheter and deployed in the usual manner. At this time the Zoom aspiration catheter was advanced into the proximal  superior division. With proximal flow arrest in the left internal carotid artery constant aspiration was applied at the hub of the Zoom aspiration catheter and the aspiration device 2 minutes of the balloon guide catheter with a 20 mL  syringe. Combination of the retrieval, the catheter and the Zoom aspiration catheter was retrieved removed. Following reversal of flow arrest, control arteriogram with the balloon guide catheter in the left internal carotid artery demonstrated near complete opacification of the superior division with gradual improved flow into the M3 M4 segment of the superior division. Moderate spasm noted in the proximal superior division responded to 5 aliquotes of 25 mcg of nitroglycerin intra-arterially. Also noted was a small non flow limiting filling defect in a mid parietal branch of the inferior division in the distal M3 M4 region which also gradually started clearing over a period of 20 minutes. Control arteriogram performed through the balloon guide catheter in the mid cervical left demonstrated near complete revascularization of the left MCA achieving a TICI 2C revascularization. A control arteriogram performed through the left common carotid artery demonstrated small dissection of the distal left internal carotid artery at the skull base medially without any flow limitation. The balloon guide catheter was removed. Diagnostic catheter arteriogram was then performed of the right common carotid artery and the right vertebral artery. These demonstrated the right external carotid artery and its major branches to be widely patent. Internal carotid artery at the bulb to the cranial skull base demonstrates wide patency. The petrous, the cavernous and the supraclinoid segments demonstrate wide patency as do the right middle cerebral artery and the right anterior cerebral artery into the capillary and venous phases. The right vertebral artery demonstrates wide patency. More distally, patency is  maintained of the basilar junction in the right posterior-inferior cerebellar artery. The basilar artery, the posterior cerebral arteries, the superior cerebellar arteries and the anterior-inferior cerebellar arteries demonstrate patency into the capillary and venous phases. Unopacified blood was noted in the basilar artery from the contralateral left vertebral artery. An 8 French Angio-Seal closure device demonstrates hemostasis at the left groin puncture site. Distal pulses remained palpable in both feet unchanged. A CT of the brain demonstrated no hemorrhagic complications. The patient was left intubated due to poor responsiveness following reversal of the anesthesia. Patient was then transferred to the neuro ICU post revascularization care. IMPRESSION: Status post endovascular revascularization of occluded division of the left middle cerebral artery achieving a TICI 2C revascularization of the left MCA distribution. Puncture to first pass approximately 36 mins. PLAN: Follow-up as per referring MD. Electronically Signed   By: Luanne Bras M.D.   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 EXAM: 1. EMERGENT LARGE VESSEL OCCLUSION THROMBOLYSIS (anterior CIRCULATION) COMPARISON:  None Available. MEDICATIONS: No antibiotic was administered within 1 hour of the procedure. ANESTHESIA/SEDATION: General anesthesia. CONTRAST:  Omnipaque 300 150 mL. FLUOROSCOPY TIME:  Fluoroscopy Time: 39 minutes 12 seconds (1289 mGy). COMPLICATIONS: None immediate. TECHNIQUE: Following a full explanation of the procedure along with the potential associated complications, an informed witnessed consent was obtained. The risks of intracranial hemorrhage of 10%, worsening neurological deficit, ventilator dependency, death and inability to revascularize were all reviewed in detail with the patient's spouse. The patient was then put under general  anesthesia by the Department of Anesthesiology at [facility]. The left groin was prepped and draped in the usual sterile fashion. Thereafter using modified Seldinger technique, transfemoral access into the left common femoral artery was obtained without difficulty. Over a 0.035 inch guidewire an 8 French 25 cm Pinnacle sheath was inserted. Through this, and also over a 0.035 inch guidewire a combination of a wrist  Simmons 2 support catheter inside of an 087 balloon guide catheter was advanced to the aortic region, and selectively positioned initially in the left common carotid artery and then advanced to the distal cervical left ICA over an 035 inch guidewire with support catheter. The guidewire was removed. Good aspiration obtained hub of the balloon guide catheter. Arteriogram was then performed proximally from the common carotid artery then the distal left internal carotid artery. FINDINGS: The left common carotid arteriogram demonstrates the left external carotid artery and its major branches to be widely patent. The left internal carotid artery at bulb to the cranial skull base demonstrates wide patency. Wide patency is noted of the petrous, the cavernous and the supraclinoid segments. Opacification of the left posterior cerebral artery via the left posterior communicating artery is noted. The left middle cerebral artery M1 segment is widely patent. The angiographic occlusion of the superior division mid M2 segment is noted. The left MCA inferior division opacifies into the capillary and venous phases. The left anterior cerebral artery opacifies into the capillary and venous phases. Transient cross-filling via the anterior communicating artery of the right anterior cerebral artery A2 segment and the A1 segment is evident. PROCEDURE: Over an Aristotle softip micro guidewire with a J configuration, a combination of a 132 cm Zoom aspiration catheter inside of which was a 160 cm Phenom microcatheter was  advanced to the proximal left middle cerebral artery. Using a torque device, access was obtained with the micro guidewire through the occluded superior division into the proximal M3 segment followed by the microcatheter. The guidewire was removed. Good aspiration was obtained from the hub of the microcatheter. A gentle contrast injection demonstrated slow antegrade flow of contrast distally. A 3 mm x 36 mm retrieval device was then advanced to the distal end of the microcatheter and deployed in the usual manner. At this time the Zoom aspiration catheter was advanced into the proximal superior division. With proximal flow arrest in the left internal carotid artery constant aspiration was applied at the hub of the Zoom aspiration catheter and the aspiration device 2 minutes of the balloon guide catheter with a 20 mL syringe. Combination of the retrieval, the catheter and the Zoom aspiration catheter was retrieved removed. Following reversal of flow arrest, control arteriogram with the balloon guide catheter in the left internal carotid artery demonstrated near complete opacification of the superior division with gradual improved flow into the M3 M4 segment of the superior division. Moderate spasm noted in the proximal superior division responded to 5 aliquotes of 25 mcg of nitroglycerin intra-arterially. Also noted was a small non flow limiting filling defect in a mid parietal branch of the inferior division in the distal M3 M4 region which also gradually started clearing over a period of 20 minutes. Control arteriogram performed through the balloon guide catheter in the mid cervical left demonstrated near complete revascularization of the left MCA achieving a TICI 2C revascularization. A control arteriogram performed through the left common carotid artery demonstrated small dissection of the distal left internal carotid artery at the skull base medially without any flow limitation. The balloon guide catheter was  removed. Diagnostic catheter arteriogram was then performed of the right common carotid artery and the right vertebral artery. These demonstrated the right external carotid artery and its major branches to be widely patent. Internal carotid artery at the bulb to the cranial skull base demonstrates wide patency. The petrous, the cavernous and the supraclinoid segments demonstrate wide patency as do the right middle  cerebral artery and the right anterior cerebral artery into the capillary and venous phases. The right vertebral artery demonstrates wide patency. More distally, patency is maintained of the basilar junction in the right posterior-inferior cerebellar artery. The basilar artery, the posterior cerebral arteries, the superior cerebellar arteries and the anterior-inferior cerebellar arteries demonstrate patency into the capillary and venous phases. Unopacified blood was noted in the basilar artery from the contralateral left vertebral artery. An 8 French Angio-Seal closure device demonstrates hemostasis at the left groin puncture site. Distal pulses remained palpable in both feet unchanged. A CT of the brain demonstrated no hemorrhagic complications. The patient was left intubated due to poor responsiveness following reversal of the anesthesia. Patient was then transferred to the neuro ICU post revascularization care. IMPRESSION: Status post endovascular revascularization of occluded division of the left middle cerebral artery achieving a TICI 2C revascularization of the left MCA distribution. Puncture to first pass approximately 36 mins. PLAN: Follow-up as per referring MD. Electronically Signed   By: Luanne Bras M.D.   On: 01/30/2022 08:42   MR BRAIN WO CONTRAST  Result Date: 01/28/2022 CLINICAL DATA:  Right MCA stroke status post thrombectomy, follow-up EXAM: MRI HEAD WITHOUT CONTRAST MRA HEAD WITHOUT CONTRAST TECHNIQUE: Multiplanar, multi-echo pulse sequences of the brain and surrounding  structures were acquired without intravenous contrast. Angiographic images of the Circle of Willis were acquired using MRA technique without intravenous contrast. COMPARISON:  01/22/2022 MRI head, no prior MRA, correlation is made with CTA head and neck 01/21/2022 FINDINGS: Brain: New foci of restricted diffusion with ADC correlate in the left posterior frontal and anterior parietal cortex (series 5, images 80 8-95). Redemonstrated focus of restricted diffusion with ADC correlate in the left insula (series 5, image 76 and series 6, image 26). Other previously noted foci of restricted diffusion are decreased in conspicuity and do not have persistent ADC correlates. No acute hemorrhage, mass, mass effect, or midline shift. No hydrocephalus or extra-axial collection. Punctate focus of hemosiderin deposition in the left posterior frontal lobe. Minimal T2 hyperintense signal in the periventricular Snare matter and pons, likely the sequela of mild chronic small vessel ischemic disease. Vascular: Normal arterial flow voids. Skull and upper cervical spine: Normal marrow signal. Sinuses/Orbits: Minimal mucosal thickening in the ethmoid air cells. The orbits are unremarkable. Other: The mastoids are well aerated. MRA HEAD FINDINGS Anterior circulation: Both internal carotid arteries are patent to the termini, without significant stenosis. A1 segments patent. Normal anterior communicating artery. Anterior cerebral arteries are patent to their distal aspects. No M1 stenosis or occlusion. Distal MCA branches perfused and symmetric.In particular, the left MCA branches appear perfused and without focal stenosis. Posterior circulation: Vertebral arteries patent to the vertebrobasilar junction without stenosis. Basilar patent to its distal aspect. Superior cerebellar arteries patent bilaterally. Patent P1 segments. PCAs perfused to their distal aspects without stenosis. Diminutive left posterior communicating artery. The right  posterior communicating artery is not definitively visualized. Anatomic variants: None significant IMPRESSION: 1. 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 2. No intracranial large vessel occlusion or significant stenosis. In particular, left MCA branches appear perfused and without focal stenosis. These results will be called to the ordering clinician or representative by the Radiologist Assistant, and communication documented in the PACS or [employer]. Electronically Signed   By: Merilyn Baba M.D.   On: 01/28/2022 23:40   MR ANGIO HEAD WO CONTRAST  Result Date: 01/28/2022 CLINICAL DATA:  Right MCA stroke status post thrombectomy, follow-up  EXAM: MRI HEAD WITHOUT CONTRAST MRA HEAD WITHOUT CONTRAST TECHNIQUE: Multiplanar, multi-echo pulse sequences of the brain and surrounding structures were acquired without intravenous contrast. Angiographic images of the Circle of Willis were acquired using MRA technique without intravenous contrast. COMPARISON:  01/22/2022 MRI head, no prior MRA, correlation is made with CTA head and neck 01/21/2022 FINDINGS: Brain: New foci of restricted diffusion with ADC correlate in the left posterior frontal and anterior parietal cortex (series 5, images 80 8-95). Redemonstrated focus of restricted diffusion with ADC correlate in the left insula (series 5, image 76 and series 6, image 26). Other previously noted foci of restricted diffusion are decreased in conspicuity and do not have persistent ADC correlates. No acute hemorrhage, mass, mass effect, or midline shift. No hydrocephalus or extra-axial collection. Punctate focus of hemosiderin deposition in the left posterior frontal lobe. Minimal T2 hyperintense signal in the periventricular Sabino matter and pons, likely the sequela of mild chronic small vessel ischemic disease. Vascular: Normal arterial flow voids. Skull and upper cervical spine: Normal marrow signal. Sinuses/Orbits: Minimal  mucosal thickening in the ethmoid air cells. The orbits are unremarkable. Other: The mastoids are well aerated. MRA HEAD FINDINGS Anterior circulation: Both internal carotid arteries are patent to the termini, without significant stenosis. A1 segments patent. Normal anterior communicating artery. Anterior cerebral arteries are patent to their distal aspects. No M1 stenosis or occlusion. Distal MCA branches perfused and symmetric.In particular, the left MCA branches appear perfused and without focal stenosis. Posterior circulation: Vertebral arteries patent to the vertebrobasilar junction without stenosis. Basilar patent to its distal aspect. Superior cerebellar arteries patent bilaterally. Patent P1 segments. PCAs perfused to their distal aspects without stenosis. Diminutive left posterior communicating artery. The right posterior communicating artery is not definitively visualized. Anatomic variants: None significant IMPRESSION: 1. 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 2. No intracranial large vessel occlusion or significant stenosis. In particular, left MCA branches appear perfused and without focal stenosis. These results will be called to the ordering clinician or representative by the Radiologist Assistant, and communication documented in the PACS or [employer]. Electronically Signed   By: Merilyn Baba M.D.   On: 01/28/2022 23:40   [place] EKG SITE RITE  Result Date: 01/28/2022 If Site Rite image not attached, placement could not be confirmed due to current cardiac rhythm.  DG Abd 1 View  Result Date: 01/28/2022 CLINICAL DATA:  Evaluate OG tube placement EXAM: ABDOMEN - 1 VIEW COMPARISON:  None Available. FINDINGS: The OG tube terminates in the left upper quadrant, in the region of the stomach. IMPRESSION: The OG tube appears to terminate in the stomach. No other abnormalities. Electronically Signed   By: Dorise Bullion III M.D.   On: 01/28/2022 17:28    Portable Chest x-ray  Result Date: 01/28/2022 CLINICAL DATA:  [id number]. Encounter for ETT placement. EXAM: PORTABLE CHEST 1 VIEW COMPARISON:  Chest x-ray 01/26/2022, CT heart 01/23/2022 FINDINGS: Enteric tube with tip terminating 3 cm above the carina. Slightly more prominent aortic arch and aortic knob likely due to positioning and AP portable technique. Otherwise the heart and mediastinal contours are unchanged normal limits. No focal consolidation. No pulmonary edema. No pleural effusion. No pneumothorax. No acute osseous abnormality. IMPRESSION: 1. Slightly more prominent aortic arch and aortic knob likely due to positioning and AP portable technique. Finding can likely be further evaluated on CT angio head and neck 01/28/2022. 2. No active disease. Electronically Signed   By: Clelia Croft.D.  On: 01/28/2022 17:21   CT HEAD CODE STROKE WO CONTRAST  Result Date: 01/28/2022 CLINICAL DATA:  Code stroke.  Neuro deficit, acute, stroke suspected EXAM: CT HEAD WITHOUT CONTRAST TECHNIQUE: Contiguous axial images were obtained from the base of the skull through the vertex without intravenous contrast. RADIATION DOSE REDUCTION: This exam was performed according to the departmental dose-optimization program which includes automated exposure control, adjustment of the mA and/or kV according to patient size and/or use of iterative reconstruction technique. COMPARISON:  CT head 01/21/2022. FINDINGS: Brain: No evidence of acute large vascular territory infarction, hemorrhage, hydrocephalus, extra-axial collection or mass lesion/mass effect. Vascular: No hyperdense vessel identified. Skull: No acute fracture. Sinuses/Orbits: Clear sinuses.  No acute orbital findings. Other: No mastoid effusions. ASPECTS [facility] Stroke Program Early CT Score) total score (0-10 with 10 being normal): 10. IMPRESSION: 1. No evidence of acute intracranial abnormality. 2. ASPECTS is 10. Code stroke imaging results were communicated on  01/28/2022 at 1:43 pm to provider Bhagat via secure text paging. Electronically Signed   By: Margaretha Sheffield M.D.   On: 01/28/2022 13:43   DG Chest 2 View  Result Date: 01/26/2022 CLINICAL DATA:  Chest and shoulder pain.  Recent MRI and CVA. EXAM: CHEST - 2 VIEW COMPARISON:  11/21/2021 FINDINGS: The lungs are clear without focal pneumonia, edema, pneumothorax or pleural effusion. The cardiopericardial silhouette is within normal limits for size. The visualized bony structures of the thorax are unremarkable. IMPRESSION: No active cardiopulmonary disease. Electronically Signed   By: Misty Stanley M.D.   On: 01/26/2022 12:03   CT CORONARY MORPH W/CTA COR W/SCORE W/CA W/CM &/OR WO/CM  Addendum Date: 01/23/2022   ADDENDUM REPORT: 01/23/2022 13:25 EXAM: OVER-READ INTERPRETATION  CT CHEST The following report is a limited chest CT over-read performed by radiologist Dr. Lindaann Slough [facility] on 01/23/2022. This over-read does not include interpretation of cardiac or coronary anatomy or pathology. The coronary CTA interpretation by the cardiologist is attached. COMPARISON:  Radiographs 11/21/2021. Coronary artery calcium score CT 10/04/2020 FINDINGS: Mediastinum/Nodes: No enlarged lymph nodes within the visualized mediastinum. Lungs/Pleura: There is no pleural effusion. The visualized lungs appear clear. Upper abdomen: No significant findings within the visualized upper abdomen. Musculoskeletal/Chest wall: No chest wall mass or suspicious osseous findings within the visualized chest. IMPRESSION: No significant extracardiac findings within the visualized chest. Electronically Signed   By: Richardean Sale M.D.   On: 01/23/2022 13:25   Result Date: 01/23/2022 HISTORY: Chest pain/anginal equiv, ECGs or troponins abnormal EXAM: Cardiac/Coronary CT TECHNIQUE: The patient was scanned on a [employer]. PROTOCOL: A 100 kV prospective scan was triggered in the descending thoracic aorta at 111  HU's. Axial non-contrast 3 mm slices were carried out through the heart. The data set was analyzed on a dedicated work station and scored using the Agatston method. Gantry rotation speed was 250 msecs and collimation was .6 mm. Heart rate was optimized medically and sl NTG was given. The 3D data set was reconstructed in 5% intervals of the 35-75 % of the R-R cycle. Systolic and diastolic phases were analyzed on a dedicated work station using MPR, MIP and VRT modes. The patient received contrast. FINDINGS: Coronary calcium score: The patient's coronary artery calcium score is 0, which places the patient in the 0 percentile. Coronary arteries: Normal coronary origins.  Right dominance. Right Coronary Artery: Normal caliber vessel, gives rise to small PDA. No significant plaque or stenosis. Left Main Coronary Artery: Normal caliber vessel. No significant plaque or stenosis.  Small caliber ramus without significant plaque or stenosis. Left Anterior Descending Coronary Artery: Normal caliber vessel. Minimal noncalcified plaque with 1-24% stenosis. Gives rise to large first diagonal branch without significant stenosis. Distal LAD wraps apex Left Circumflex Artery: Normal caliber vessel. Very trivial noncalcified plaque without significant stenosis. Gives rise to large first and second, small third OM branches. Aorta: Normal size, 31 mm at the mid ascending aorta (level of the PA bifurcation) measured double oblique. Trivial aortic atherosclerosis. No dissection seen in visualized portions of the aorta. Aortic Valve: No calcifications. Trileaflet. Other findings: Normal pulmonary vein drainage into the left atrium. Normal left atrial appendage without a thrombus. Appendage is chicken wing type. Normal size of the pulmonary artery. Normal appearance of the pericardium. There is an apical LV thrombus, dimensions saves on PACS images. Maximum dimension 15.6 x 11.3 mm There is a small accessory left atrial segment on the  cranial aspect of the left atrium. IMPRESSION: 1.  Very minimal nonobstructive CAD, CADRADS = 1. 2. Coronary calcium score of 0. This was 0 percentile for age and sex matched control. 3. Normal coronary origin with right dominance. 4. There is an apical LV thrombus, dimensions saves on PACS images. Maximum dimension 15.6 x 11.3 mm Findings reported to ordering provider. INTERPRETATION: CAD-RADS 1: Minimal non-obstructive CAD (0-24%). Consider non-atherosclerotic causes of chest pain. Consider preventive therapy and risk factor modification. Electronically Signed: By: Buford Dresser M.D. 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 She initially presented on 01/21/2022 with transient episode of aphasia followed sudden onset dizziness, nausea, vomiting, chest pain that radiated to upper back on Saturday. MRI brain showed small ischemic infarct of the frontal Fluellen matter on the left. Her troponins were elevated to over 300 and her echocardiogram showed apical hypokinesis and a left ventricular thrombus consistent with NSTEMI. Around 6 p.m. on 01/21/2022, patient developed worsening aphasia, NIHSS 6. Head CT was unremarkable. CT angiogram of the head and neck showed a left M2/M3-MCA occlusion. She was then transferred to our service for an emergency mechanical thrombectomy. EXAM: ULTRASOUND-GUIDED VASCULAR ACCESS DIAGNOSTIC CEREBRAL ANGIOGRAM MECHANICAL THROMBECTOMY FLAT PANEL HEAD CT COMPARISON:  CT/CT angiogram of the head and neck January 22, 2019 MEDICATIONS: No antibiotic was administered. ANESTHESIA/SEDATION: The procedure was performed  under general anesthesia. CONTRAST:  75 mL of Omnipaque 300 milligram/mL. FLUOROSCOPY: Radiation Exposure Index (as provided by the fluoroscopic device): 364.6 mGy Kerma COMPLICATIONS: None immediate. TECHNIQUE: Informed written consent was obtained from the patient after a thorough discussion of the procedural risks, benefits and alternatives. All questions were addressed. Maximal Sterile Barrier Technique was utilized including caps, mask, sterile gowns, sterile gloves, sterile drape, hand hygiene and skin antiseptic. A timeout was performed prior to the initiation of the procedure. The right groin was prepped and draped in the usual sterile fashion. Using a micropuncture kit and the modified Seldinger technique, access was gained to the right common femoral artery and an 8 French sheath was placed. Real-time ultrasound guidance was utilized for vascular access including the acquisition of a permanent ultrasound image documenting patency of the accessed vessel. Under fluoroscopy, a Zoom 88 guide catheter was navigated over a 6 [place] VTK catheter and a 0.035" Terumo Glidewire into the aortic arch. The catheter was placed  into the left common carotid artery and then advanced into the left internal carotid artery. The diagnostic catheter was removed. Frontal and lateral angiograms of the head were obtained. FINDINGS: 1. Normal caliber of the right common femoral artery, adequate for vascular access. 2. There is no occlusion of the distal left M2/MCA superior division branch extending to the M3 segment. PROCEDURE: Using biplane roadmap, a Vect 46 aspiration catheter was navigated over an Aristotle 24 microguidewire into the cavernous segment of the left ICA. The aspiration catheter was then advanced over the wire to the level of occlusion in the M2-M3 segment and connected to an aspiration pump. Continuous aspiration was performed for 2 minutes. The guide catheter was connected to a VacLok syringe. The aspiration  catheter was subsequently removed under constant aspiration. The guide catheter was aspirated for debris. Left internal carotid artery angiograms with magnified frontal and lateral views of the head showed recanalization of the left MCA with slow flow in a few distal cortical branches (TICI 2C). No embolus to new territory. Flat panel CT of the head was obtained and post processed in a separate workstation with concurrent attending physician supervision. Selected images were sent to PACS. No evidence of hemorrhagic complication. Delayed left internal carotid artery angiogram showed persistent patency of the left MCA. Right common femoral artery angiogram was obtained in right anterior oblique view. The puncture is at the level of the common femoral artery. The artery has normal caliber, adequate for closure device. The sheath was exchanged over the wire for a Perclose prostyle which was utilized for access closure. Immediate hemostasis was achieved. IMPRESSION: 1. Successful mechanical thrombectomy for treatment of a distal left M2/MCA occlusion with direct contact aspiration achieving complete recanalization (TICI 2C). 2. No thromboembolic or hemorrhagic complication. PLAN: Transfer to ICU for continued post stroke care. Electronically Signed   By: Pedro Earls M.D.   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 She initially presented on 01/21/2022 with transient episode of aphasia followed sudden onset dizziness, nausea, vomiting, chest pain that radiated to upper back on Saturday. MRI brain showed small ischemic infarct of the frontal Taussig matter on the left. Her troponins were  elevated to over 300 and her echocardiogram showed apical hypokinesis and a left ventricular thrombus consistent with NSTEMI. Around 6 p.m. on 01/21/2022, patient developed worsening aphasia, NIHSS 6. Head CT was unremarkable. CT angiogram of the head and neck showed a left M2/M3-MCA occlusion. She was then transferred to our service for an emergency mechanical thrombectomy. EXAM: ULTRASOUND-GUIDED VASCULAR ACCESS DIAGNOSTIC CEREBRAL ANGIOGRAM MECHANICAL THROMBECTOMY FLAT PANEL HEAD CT COMPARISON:  CT/CT angiogram of the head and neck January 22, 2019 MEDICATIONS: No antibiotic was administered. ANESTHESIA/SEDATION: The procedure was performed under general anesthesia. CONTRAST:  75 mL of Omnipaque 300 milligram/mL. FLUOROSCOPY: Radiation Exposure Index (as provided by the fluoroscopic device): 035.0 mGy Kerma COMPLICATIONS: None immediate. TECHNIQUE: Informed written consent was obtained from the patient after a thorough discussion of the procedural risks, benefits and alternatives. All questions were addressed. Maximal Sterile Barrier Technique was utilized including caps, mask, sterile gowns, sterile gloves, sterile drape, hand hygiene and skin antiseptic. A timeout was performed prior to the initiation of the procedure. The right groin was prepped and draped in the usual sterile  fashion. Using a micropuncture kit and the modified Seldinger technique, access was gained to the right common femoral artery and an 8 French sheath was placed. Real-time ultrasound guidance was utilized for vascular access including the acquisition of a permanent ultrasound image documenting patency of the accessed vessel. Under fluoroscopy, a Zoom 88 guide catheter was navigated over a 6 [place] VTK catheter and a 0.035" Terumo Glidewire into the aortic arch. The catheter was placed into the left common carotid artery and then advanced into the left internal carotid artery. The diagnostic catheter was removed. Frontal and lateral  angiograms of the head were obtained. FINDINGS: 1. Normal caliber of the right common femoral artery, adequate for vascular access. 2. There is no occlusion of the distal left M2/MCA superior division branch extending to the M3 segment. PROCEDURE: Using biplane roadmap, a Vect 46 aspiration catheter was navigated over an Aristotle 24 microguidewire into the cavernous segment of the left ICA. The aspiration catheter was then advanced over the wire to the level of occlusion in the M2-M3 segment and connected to an aspiration pump. Continuous aspiration was performed for 2 minutes. The guide catheter was connected to a VacLok syringe. The aspiration catheter was subsequently removed under constant aspiration. The guide catheter was aspirated for debris. Left internal carotid artery angiograms with magnified frontal and lateral views of the head showed recanalization of the left MCA with slow flow in a few distal cortical branches (TICI 2C). No embolus to new territory. Flat panel CT of the head was obtained and post processed in a separate workstation with concurrent attending physician supervision. Selected images were sent to PACS. No evidence of hemorrhagic complication. Delayed left internal carotid artery angiogram showed persistent patency of the left MCA. Right common femoral artery angiogram was obtained in right anterior oblique view. The puncture is at the level of the common femoral artery. The artery has normal caliber, adequate for closure device. The sheath was exchanged over the wire for a Perclose prostyle which was utilized for access closure. Immediate hemostasis was achieved. IMPRESSION: 1. Successful mechanical thrombectomy for treatment of a distal left M2/MCA occlusion with direct contact aspiration achieving complete recanalization (TICI 2C). 2. No thromboembolic or hemorrhagic complication. PLAN: Transfer to ICU for continued post stroke care. Electronically Signed   By: Pedro Earls M.D.   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 She initially presented on 01/21/2022 with transient episode of aphasia followed sudden onset dizziness, nausea, vomiting, chest pain that radiated to upper back on Saturday. MRI brain showed small ischemic infarct of the frontal Vea matter on the left. Her troponins were elevated to over 300 and her echocardiogram showed apical hypokinesis and a left ventricular thrombus consistent with NSTEMI. Around 6 p.m. on 01/21/2022, patient developed worsening aphasia, NIHSS 6. Head CT was unremarkable. CT angiogram of the head and neck showed a left M2/M3-MCA occlusion. She was then transferred to our service for an emergency mechanical thrombectomy. EXAM: ULTRASOUND-GUIDED VASCULAR ACCESS DIAGNOSTIC CEREBRAL ANGIOGRAM MECHANICAL THROMBECTOMY FLAT PANEL HEAD CT COMPARISON:  CT/CT angiogram of the head and neck January 22, 2019 MEDICATIONS: No antibiotic was administered. ANESTHESIA/SEDATION: The procedure was performed under general anesthesia. CONTRAST:  75 mL of Omnipaque 300 milligram/mL. FLUOROSCOPY: Radiation Exposure Index (  as provided by the fluoroscopic device): 546.5 mGy Kerma COMPLICATIONS: None immediate. TECHNIQUE: Informed written consent was obtained from the patient after a thorough discussion of the procedural risks, benefits and alternatives. All questions were addressed. Maximal Sterile Barrier Technique was utilized including caps, mask, sterile gowns, sterile gloves, sterile drape, hand hygiene and skin antiseptic. A timeout was performed prior to the initiation of the procedure. The right groin was prepped and  draped in the usual sterile fashion. Using a micropuncture kit and the modified Seldinger technique, access was gained to the right common femoral artery and an 8 French sheath was placed. Real-time ultrasound guidance was utilized for vascular access including the acquisition of a permanent ultrasound image documenting patency of the accessed vessel. Under fluoroscopy, a Zoom 88 guide catheter was navigated over a 6 [place] VTK catheter and a 0.035" Terumo Glidewire into the aortic arch. The catheter was placed into the left common carotid artery and then advanced into the left internal carotid artery. The diagnostic catheter was removed. Frontal and lateral angiograms of the head were obtained. FINDINGS: 1. Normal caliber of the right common femoral artery, adequate for vascular access. 2. There is no occlusion of the distal left M2/MCA superior division branch extending to the M3 segment. PROCEDURE: Using biplane roadmap, a Vect 46 aspiration catheter was navigated over an Aristotle 24 microguidewire into the cavernous segment of the left ICA. The aspiration catheter was then advanced over the wire to the level of occlusion in the M2-M3 segment and connected to an aspiration pump. Continuous aspiration was performed for 2 minutes. The guide catheter was connected to a VacLok syringe. The aspiration catheter was subsequently removed under constant aspiration. The guide catheter was aspirated for debris. Left internal carotid artery angiograms with magnified frontal and lateral views of the head showed recanalization of the left MCA with slow flow in a few distal cortical branches (TICI 2C). No embolus to new territory. Flat panel CT of the head was obtained and post processed in a separate workstation with concurrent attending physician supervision. Selected images were sent to PACS. No evidence of hemorrhagic complication. Delayed left internal carotid artery angiogram showed persistent patency of the left MCA.  Right common femoral artery angiogram was obtained in right anterior oblique view. The puncture is at the level of the common femoral artery. The artery has normal caliber, adequate for closure device. The sheath was exchanged over the wire for a Perclose prostyle which was utilized for access closure. Immediate hemostasis was achieved. IMPRESSION: 1. Successful mechanical thrombectomy for treatment of a distal left M2/MCA occlusion with direct contact aspiration achieving complete recanalization (TICI 2C). 2. No thromboembolic or hemorrhagic complication. PLAN: Transfer to ICU for continued post stroke care. Electronically Signed   By: Pedro Earls M.D.   On: 01/22/2022 14:25   MR BRAIN WO CONTRAST  Result Date: 01/22/2022 CLINICAL DATA:  Follow-up stroke EXAM: MRI HEAD WITHOUT CONTRAST TECHNIQUE: Multiplanar, multiecho pulse sequences of the brain and surrounding structures were obtained without intravenous contrast. COMPARISON:  Brain MRI and CT/CTA head and neck 1 day prior FINDINGS: Brain: A punctate fusion of diffusion restriction in the left frontal lobe periventricular Kovar matter is unchanged compared to the study from 1 day prior. Additional small foci of diffusion restriction in the left external capsule, insula, and frontal lobe cortex are new. There is no associated hemorrhage or mass effect. Parenchymal volume is normal. The ventricles are normal in size. Parenchymal signal is otherwise normal, with no  significant burden of under vitamin chronic small vessel ischemic change. There is no mass lesion.  There is no mass effect or midline shift. Vascular: Normal flow voids. Skull and upper cervical spine: Normal marrow signal. Sinuses/Orbits: The paranasal sinuses are clear. The globes and orbits are unremarkable. Other: None. IMPRESSION: New punctate acute infarcts in the left external capsule, insula, and frontal lobe cortex, and unchanged additional punctate infarct in the left  frontal lobe Wiedel matter. Electronically Signed   By: Valetta Mole M.D.   On: 01/22/2022 11:39   CT CEREBRAL PERFUSION W CONTRAST  Result Date: 01/21/2022 CLINICAL DATA:  Neuro deficit, acute, stroke suspected. EXAM: CT ANGIOGRAPHY HEAD AND NECK CT PERFUSION BRAIN TECHNIQUE: Multidetector CT imaging of the head and neck was performed using the standard protocol during bolus administration of intravenous contrast. Multiplanar CT image reconstructions and MIPs were obtained to evaluate the vascular anatomy. Carotid stenosis measurements (when applicable) are obtained utilizing NASCET criteria, using the distal internal carotid diameter as the denominator. Multiphase CT imaging of the brain was performed following IV bolus contrast injection. Subsequent parametric perfusion maps were calculated using RAPID software. RADIATION DOSE REDUCTION: This exam was performed according to the departmental dose-optimization program which includes automated exposure control, adjustment of the mA and/or kV according to patient size and/or use of iterative reconstruction technique. CONTRAST:  134m OMNIPAQUE IOHEXOL 350 MG/ML SOLN COMPARISON:  Brain MRI 01/21/2022. Noncontrast head CT performed earlier today 01/21/2022. FINDINGS: CTA NECK FINDINGS Aortic arch: Standard aortic branching. Mild atherosclerotic plaque within the visualized aortic arch and proximal major branch vessels of the neck. Streak and beam hardening artifact arising from a dense right-sided contrast bolus partially obscures the right subclavian artery. Within this limitation, there is no appreciable hemodynamically significant innominate or proximal subclavian artery stenosis. Right carotid system: CCA and ICA patent within the neck without stenosis. Mild atherosclerotic plaque about the carotid bifurcation. Left carotid system: CCA and ICA patent within the neck without stenosis. Minimal atherosclerotic plaque about the carotid bifurcation. Vertebral  arteries: Vertebral arteries codominant and patent within the neck without stenosis. Nonstenotic atherosclerotic plaque at the origin of the right vertebral artery. Skeleton: Cervical spondylosis. No acute fracture or aggressive osseous lesion. Other neck: No neck mass or cervical lymphadenopathy. Upper chest: No consolidation within the imaged lung apices. Review of the MIP images confirms the above findings CTA HEAD FINDINGS Anterior circulation: The intracranial internal carotid arteries are patent. The M1 middle cerebral arteries are patent. Occluded mid M2 left MCA vessel (with distal reconstitution, likely due to collateral flow) (for instance as seen on series 15, image 15). The anterior cerebral arteries are patent. No intracranial aneurysm is identified. Posterior circulation: The intracranial vertebral arteries are patent. The basilar artery is patent. The posterior cerebral arteries are patent. Posterior communicating arteries are diminutive or absent, bilaterally. Venous sinuses: Within the limitations of contrast timing, no convincing thrombus. Anatomic variants: As described. Review of the MIP images confirms the above findings CT Brain Perfusion Findings: CBF (<30%) Volume: 09mPerfusion (Tmax>6.0s) volume: 75m5mismatch Volume: 0mL65mfarction Location:None identified CTA head impression and CT perfusion head impression called by telephone at the time of interpretation on 01/21/2022 at 6:50 pm to provider MCNEILL KIRKMan[facility] LLCho verbally acknowledged these results. IMPRESSION: CTA neck: 1. The common carotid, internal carotid and vertebral arteries are patent within the neck without stenosis. Minimal non-stenotic atherosclerotic plaque about the carotid bifurcations and at the origin of the right vertebral artery. 2.  Aortic Atherosclerosis (ICD10-I70.0). CTA  head: Occluded mid M2 left MCA vessel (with distal reconstitution, likely due to collateral flow). CT perfusion head: The perfusion software  identifies a 7 mL region of critically hypoperfused parenchyma within the left insula/frontal lobe (utilizing the Tmax>6 seconds threshold). No core infarct is identified. Reported mismatch volume: 7 mL. Electronically Signed   By: Kellie Simmering D.O.   On: 01/21/2022 19:09   CT ANGIO HEAD NECK W WO CM (CODE STROKE)  Result Date: 01/21/2022 CLINICAL DATA:  Neuro deficit, acute, stroke suspected. EXAM: CT ANGIOGRAPHY HEAD AND NECK CT PERFUSION BRAIN TECHNIQUE: Multidetector CT imaging of the head and neck was performed using the standard protocol during bolus administration of intravenous contrast. Multiplanar CT image reconstructions and MIPs were obtained to evaluate the vascular anatomy. Carotid stenosis measurements (when applicable) are obtained utilizing NASCET criteria, using the distal internal carotid diameter as the denominator. Multiphase CT imaging of the brain was performed following IV bolus contrast injection. Subsequent parametric perfusion maps were calculated using RAPID software. RADIATION DOSE REDUCTION: This exam was performed according to the departmental dose-optimization program which includes automated exposure control, adjustment of the mA and/or kV according to patient size and/or use of iterative reconstruction technique. CONTRAST:  192m OMNIPAQUE IOHEXOL 350 MG/ML SOLN COMPARISON:  Brain MRI 01/21/2022. Noncontrast head CT performed earlier today 01/21/2022. FINDINGS: CTA NECK FINDINGS Aortic arch: Standard aortic branching. Mild atherosclerotic plaque within the visualized aortic arch and proximal major branch vessels of the neck. Streak and beam hardening artifact arising from a dense right-sided contrast bolus partially obscures the right subclavian artery. Within this limitation, there is no appreciable hemodynamically significant innominate or proximal subclavian artery stenosis. Right carotid system: CCA and ICA patent within the neck without stenosis. Mild atherosclerotic plaque  about the carotid bifurcation. Left carotid system: CCA and ICA patent within the neck without stenosis. Minimal atherosclerotic plaque about the carotid bifurcation. Vertebral arteries: Vertebral arteries codominant and patent within the neck without stenosis. Nonstenotic atherosclerotic plaque at the origin of the right vertebral artery. Skeleton: Cervical spondylosis. No acute fracture or aggressive osseous lesion. Other neck: No neck mass or cervical lymphadenopathy. Upper chest: No consolidation within the imaged lung apices. Review of the MIP images confirms the above findings CTA HEAD FINDINGS Anterior circulation: The intracranial internal carotid arteries are patent. The M1 middle cerebral arteries are patent. Occluded mid M2 left MCA vessel (with distal reconstitution, likely due to collateral flow) (for instance as seen on series 15, image 15). The anterior cerebral arteries are patent. No intracranial aneurysm is identified. Posterior circulation: The intracranial vertebral arteries are patent. The basilar artery is patent. The posterior cerebral arteries are patent. Posterior communicating arteries are diminutive or absent, bilaterally. Venous sinuses: Within the limitations of contrast timing, no convincing thrombus. Anatomic variants: As described. Review of the MIP images confirms the above findings CT Brain Perfusion Findings: CBF (<30%) Volume: 072mPerfusion (Tmax>6.0s) volume: 55m51mismatch Volume: 0mL22mfarction Location:None identified CTA head impression and CT perfusion head impression called by telephone at the time of interpretation on 01/21/2022 at 6:50 pm to provider MCNEILL KIRKThomas[facility] Surgery Centerho verbally acknowledged these results. IMPRESSION: CTA neck: 1. The common carotid, internal carotid and vertebral arteries are patent within the neck without stenosis. Minimal non-stenotic atherosclerotic plaque about the carotid bifurcations and at the origin of the right vertebral artery. 2.  Aortic  Atherosclerosis (ICD10-I70.0). CTA head: Occluded mid M2 left MCA vessel (with distal reconstitution, likely due to collateral flow). CT perfusion head: The perfusion software identifies  a 7 mL region of critically hypoperfused parenchyma within the left insula/frontal lobe (utilizing the Tmax>6 seconds threshold). No core infarct is identified. Reported mismatch volume: 7 mL. Electronically Signed   By: Kellie Simmering D.O.   On: 01/21/2022 19:09   CT HEAD CODE STROKE WO CONTRAST  Result Date: 01/21/2022 CLINICAL DATA:  Code stroke. Neuro deficit, acute, stroke suspected. EXAM: CT HEAD WITHOUT CONTRAST TECHNIQUE: Contiguous axial images were obtained from the base of the skull through the vertex without intravenous contrast. RADIATION DOSE REDUCTION: This exam was performed according to the departmental dose-optimization program which includes automated exposure control, adjustment of the mA and/or kV according to patient size and/or use of iterative reconstruction technique. COMPARISON:  Same-day brain MRI 01/21/2022. FINDINGS: Brain: No age advanced or lobar predominant parenchymal atrophy. A subcentimeter acute infarct within the left frontal lobe Sylvestre matter (adjacent to the left lateral ventricle frontal horn) is occult by CT and was better appreciated on the brain MRI performed earlier today. No CT evidence of interval acute infarct. Minimal chronic small-vessel image changes within the cerebral Findling matter and pons, better appreciated on the prior brain MRI. There is no acute intracranial hemorrhage. No extra-axial fluid collection. No evidence of an intracranial mass. No midline shift. Vascular: No hyperdense vessel. Skull: No fracture or aggressive osseous lesion. Sinuses/Orbits: No mass or acute finding within the imaged orbits. Trace mucosal thickening within the anterior left ethmoid air cells. ASPECTS ([place] Stroke Program Early CT Score) - Ganglionic level infarction (caudate, lentiform nuclei,  internal capsule, insula, M1-M3 cortex): 7 - Supraganglionic infarction (M4-M6 cortex): 3 Total score (0-10 with 10 being normal): 10 These results were communicated to Dr. Leonel Ramsay at 6:27 pmon 8/29/2023by text page via the [facility] messaging system. IMPRESSION: A known subcentimeter acute infarct within the left frontal lobe Molla matter is occult by CT, and was better appreciated on the brain MRI performed earlier today. No CT evidence of interval acute intracranial abnormality. Minimal chronic small-vessel ischemic changes within the cerebral Steele matter and pons. Electronically Signed   By: Kellie Simmering D.O.   On: 01/21/2022 18:27   ECHOCARDIOGRAM COMPLETE  Result Date: 01/21/2022    ECHOCARDIOGRAM REPORT   Patient Name:   Chloe Johnson Casteneda Date of Exam: 01/21/2022 Medical Rec #:  [id number]    Height:       61.0 in Accession #:    [id number]   Weight:       171.0 lb Date of Birth:  May 07, 1956    BSA:          1.767 m Patient Age:    88 years     BP:           146/125 mmHg Patient Gender: F            HR:           72 bpm. Exam Location:  Inpatient Procedure: 2D Echo, Color Doppler and Cardiac Doppler Indications:    Stroke i63.9  History:        Patient has no prior history of Echocardiogram examinations.                 Risk Factors:Hypertension.  Sonographer:    Raquel Sarna Senior RDCS Referring Phys: [place]  1. Left ventricular ejection fraction, by estimation, is 45 to 50%. The left ventricle has mildly decreased function. The left ventricle demonstrates regional wall motion abnormalities (apical hypokinesis with out true aneurysm and with presence of an LV  thrombus 1.84 X 0.9 X 1.2 cm).  2. The mitral valve is grossly normal. No evidence of mitral valve regurgitation. No evidence of mitral stenosis.  3. The aortic valve is tricuspid. Aortic valve regurgitation is not visualized. No aortic stenosis is present.  4. Right ventricular systolic function is normal. The right ventricular size is  normal. Tricuspid regurgitation signal is inadequate for assessing PA pressure.  5. The inferior vena cava is normal in size with greater than 50% respiratory variability, suggesting right atrial pressure of 3 mmHg. Comparison(s): No prior Echocardiogram. 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 Cardiology aware. FINDINGS  Left Ventricle: Left ventricular ejection fraction, by estimation, is 45 to 50%. The left ventricle has mildly decreased function. The left ventricle demonstrates regional wall motion abnormalities. The left ventricular internal cavity size was small. There is no left ventricular hypertrophy. Left ventricular diastolic parameters are consistent with Grade I diastolic dysfunction (impaired relaxation).  LV Wall Scoring: The apical lateral segment, apical septal segment, apical anterior segment, and apical inferior segment are hypokinetic. Right Ventricle: The right ventricular size is normal. No increase in right ventricular wall thickness. Right ventricular systolic function is normal. Tricuspid regurgitation signal is inadequate for assessing PA pressure. Left Atrium: Left atrial size was normal in size. Right Atrium: Right atrial size was normal in size. Pericardium: There is no evidence of pericardial effusion. Mitral Valve: The mitral valve is grossly normal. No evidence of mitral valve regurgitation. No evidence of mitral valve stenosis. Tricuspid Valve: The tricuspid valve is normal in structure. Tricuspid valve regurgitation is not demonstrated. No evidence of tricuspid stenosis. Aortic Valve: The aortic valve is tricuspid. Aortic valve regurgitation is not visualized. No aortic stenosis is present. Pulmonic Valve: The pulmonic valve was normal in structure. Pulmonic valve regurgitation is not visualized. No evidence of pulmonic stenosis. Aorta: The aortic root and ascending aorta are structurally normal, with no evidence of dilitation. Venous: The  inferior vena cava is normal in size with greater than 50% respiratory variability, suggesting right atrial pressure of 3 mmHg. IAS/Shunts: No atrial level shunt detected by color flow Doppler.  LEFT VENTRICLE PLAX 2D LVIDd:         3.60 cm     Diastology LVIDs:         2.50 cm     LV e' medial:    6.68 cm/s LV PW:         0.80 cm     LV E/e' medial:  6.5 LV IVS:        0.80 cm     LV e' lateral:   11.00 cm/s LVOT diam:     1.70 cm     LV E/e' lateral: 3.9 LV SV:         42 LV SV Index:   24 LVOT Area:     2.27 cm  LV Volumes (MOD) LV vol d, MOD A2C: 70.1 ml LV vol d, MOD A4C: 46.6 ml LV vol s, MOD A2C: 37.3 ml LV vol s, MOD A4C: 23.3 ml LV SV MOD A2C:     32.8 ml LV SV MOD A4C:     46.6 ml LV SV MOD BP:      28.7 ml RIGHT VENTRICLE RV S prime:     9.32 cm/s TAPSE (M-mode): 1.7 cm LEFT ATRIUM             Index        RIGHT ATRIUM  Index LA diam:        2.70 cm 1.53 cm/m   RA Area:     9.52 cm LA Vol (A2C):   28.1 ml 15.90 ml/m  RA Volume:   20.50 ml 11.60 ml/m LA Vol (A4C):   26.3 ml 14.88 ml/m LA Biplane Vol: 28.8 ml 16.30 ml/m  AORTIC VALVE LVOT Vmax:   115.00 cm/s LVOT Vmean:  68.700 cm/s LVOT VTI:    0.185 m  AORTA Ao Root diam: 2.90 cm Ao Asc diam:  3.20 cm MITRAL VALVE MV Area (PHT): 3.11 cm    SHUNTS MV Decel Time: 244 msec    Systemic VTI:  0.18 m MV E velocity: 43.20 cm/s  Systemic Diam: 1.70 cm MV A velocity: 64.50 cm/s MV E/A ratio:  0.67 Rudean Haskell MD Electronically signed by Rudean Haskell MD Signature Date/Time: 01/21/2022/4:04:02 PM    Final    MR BRAIN WO CONTRAST  Result Date: 01/21/2022 CLINICAL DATA:  TIA.  Sudden onset of aphasia, now resolved. EXAM: MRI HEAD WITHOUT CONTRAST TECHNIQUE: Multiplanar, multiecho pulse sequences of the brain and surrounding structures were obtained without intravenous contrast. COMPARISON:  None Available. FINDINGS: Brain: Small acute infarct in the left frontal Jordahl matter. No hemorrhage, hydrocephalus, mass, or atrophy. Minimal  chronic small vessel changes may be present in the pons. No prior infarct is seen. Vascular: Major flow voids are preserved Skull and upper cervical spine: Normal marrow signal. Degenerative facet spurring where covered in the cervical spine with mild C2-3 anterolisthesis. Sinuses/Orbits: Negative IMPRESSION: Small acute infarct in the left frontal Sato matter. Electronically Signed   By: Jorje Guild M.D.   On: 01/21/2022 12:32       HISTORY OF PRESENT ILLNESS Chloe Johnson is a 66 y.o. 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 No tPA given due to on Eliquis and recent stroke.  On 9/5 she was taken for a mechanical thrombectomy for a left M2 occlusion with TICI2c revascularization. She is going to be on coumadin with a lovenox bridge with an INR goal of 2.5. Discharge to CIR on 02/04/2022.  HOSPITAL COURSE Stroke:  left MCA punctate scattered infarct with left M2 occlusion s/p IR with [id number], embolic secondary to LV thrombus recently on Eliquis CT no acute abnormality IR left superior M2 occlusion, inferior M3/M4 nonocclusive thrombus Status post EVT with TICI2c MRI left insular cortex and frontal lobe punctate infarcts MRA unremarkable 2D Echo EF 45 to 50% on 8/29, LV regional motion abnormality with LV thrombus LDL 150 on 8/29 HgbA1c 5 0 on 8/29 UDS positive for THC Hypercoagulable and autoimmune work up negative Heparin IV for VTE prophylaxis aspirin 81 mg daily and Eliquis (apixaban) daily prior to admission,  now on coumadin with lovenox bridge. INR goal 2.5 Continue ASA 81 per cardiology. Ongoing aggressive stroke risk factor management Therapy recommendations:  CIR Disposition:  pending   Hx of stroke and LV thrombus 8/29 admitted for nausea vomiting dizziness  chest pain and then developed right facial droop and aphasia.  Found to have troponin > 300, EF 45 to 50% with LV thrombus.  CTA coronary artery showed minimal CAD.  CT no acute abnormality.  CTA head and neck showed left M2 occlusion.  CTP 0/7.  Status post EVT with TICI2c.  MRI showed left frontal [place] matter punctate  infarct.  Repeat MRI showed left external capsule, insular cortex and frontal cortex punctate infarcts.  LDL 150, A1c 5.0.  UDS positive for THC.  Patient was put on heparin IV and aspirin and eventually discharged on Eliquis and aspirin as well as Crestor 20. Per family, patient has slight expressive aphasia since discharge, no physical deficit. Cardiology Chloe Johnson on board, recommend coumadin with INR goal 2.5. on lovenox bridge.    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 UTI- 3 days of rocephin  DISCHARGE EXAM Blood pressure 107/71, pulse 86, temperature (!) 97.4 F (36.3 C), temperature source Oral, resp. rate 17, height _0  (1.549 m), weight 59 kg, SpO2 100 %. General - Well nourished, well developed, in no apparent distress. Sitting up in bed.   Skin: left groin hematoma stable.   Neuro - awake, alert, eyes open, expressive aphasia, able to make sounds out but not meaningful words, following simple command..  Not able to name and repeat. No gaze palsy, tracking bilaterally, blinking to visual threat bilaterally.  Right mild facial droop. Tongue protrusion difficulty. LUE 5/5,  no drift, RUE 4-/5 proximal and 0/5 distally. Bilaterally LEs 5/5, no drift. Sensation symmetrical bilaterally subjectively, left FTN intact, gait not tested.   Discharge Diet      Diet   Diet regular Room service appropriate? Yes; Fluid consistency: Thin   liquids  DISCHARGE PLAN Disposition:  Transfer to [facility] for ongoing PT, OT and ST coumadin with lovenox bridge. INR goal 2.5 Continue ASA 81 per cardiology. Recommend ongoing stroke risk factor control by Primary Care Physician at time of discharge from inpatient rehabilitation. Follow-up PCP Charlane Ferretti, MD in 2 weeks following discharge from rehab. Follow-up in [facility] in 8 weeks following discharge from rehab, office to schedule an appointment.   30 minutes were spent preparing discharge.  ATTENDING ATTESTATION:  See my note from today.  Dr. Reeves Forth evaluated pt independently, reviewed imaging, chart, labs. Discussed and formulated plan with the Resident/APP. Changes were made to the note where appropriate. Please see APP/resident note above for details.    Daytona Hedman,MD

## 2022-02-04 NOTE — H&P (Signed)
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  She lives with her spouse in a 2 store home. Husband says she can stay on the first floor if needed. About 5 steps to enter the home. Husband works during the day.  Patient reported independent prior to admission without assistive device.  Presented 01/28/2022 with acute onset of right-sided weakness and aphasia.  Patient just recently returned home from traveling to [place] 5 days ago.  Admission chemistries urine drug screen positive marijuana, troponin 41 glucose 148.  CT/MRI showed new acute infarct in left posterior frontal and anterior parietal cortex.  MRA showed no intracranial large vessel occlusion or significant stenosis.  Underwent bilateral common carotid arteriogram as well as right vertebral artery angiogram showing occluded superior division of left MCA artery in the M2 region and underwent mechanical thrombectomy for revascularization per interventional radiology.  She was seen by neurology as well as cardiology (primary cardiologist Dr. Elease Hashimoto) and with noted history of recent CVA secondary to LV thrombus maintained on Eliquis she was switched to Coumadin with Lovenox bridging INR goal 2.5 as well as low-dose aspirin.  Hypercoagulable and autoimmune work up was negative. Hospital course urinalysis positive nitrite 21-50 WBCs and placed on course of Rocephin x3 days. She is tolerating regular  diet with thin liquids. Reports BM 4 days ago and again today with use of suppository. Therapy evaluations completed due to patient decreased functional mobility right side weakness was admitted for a comprehensive rehab program.   Review of Systems  Constitutional:  Negative for chills and fever.  HENT:  Negative for hearing loss.   Eyes:  Negative for blurred vision and double vision.  Respiratory:  Negative for cough.        Occasional shortness of breath with exertion  Cardiovascular:  Positive for palpitations. Negative for chest pain and leg swelling.  Gastrointestinal:        GERD , constipation Musculoskeletal:  Positive for back pain, myalgias and neck pain.  Skin:  Negative for rash.  Neurological:  Positive for speech change and weakness.  Psychiatric/Behavioral:  The patient has insomnia.        Anxiety  All other systems reviewed and are negative.       Past Medical History:  Diagnosis Date   Anxiety     Back pain     Discitis of lumbar region 11/16/2013    L3-4/notes 11/24/2013, arms, neck   Exertional asthma     GERD (gastroesophageal reflux disease)     Hypertension     Kidney stones      "have always passed them"   Neck pain     Osteomyelitis (HCC) 11/23/2013    osteomyelitis, discitis    Sleep concern      uses Prozac for sleep          Past Surgical History:  Procedure Laterality Date   BREAST CYST EXCISION Left 2010    "polypectomy"   IR CT HEAD LTD   01/21/2022   IR CT HEAD LTD  01/28/2022   IR PERCUTANEOUS ART THROMBECTOMY/INFUSION INTRACRANIAL INC DIAG ANGIO   01/21/2022   IR PERCUTANEOUS ART THROMBECTOMY/INFUSION INTRACRANIAL INC DIAG ANGIO   01/28/2022   IR US GUIDE VASC ACCESS RIGHT   01/22/2022   PICC LINE PLACE PERIPHERAL ([facility] HX) Right      for use of Levaquin & Vancomycin, of note: she reports that she had a "flulike feeling"    RADIOLOGY WITH ANESTHESIA N/A 01/21/2022    Procedure: IR WITH ANESTHESIA;  Surgeon: Julieanne Cotton, MD;  Location:  [facility] OR;  Service: Radiology;  Laterality: N/A;   RADIOLOGY WITH ANESTHESIA N/A 01/28/2022    Procedure: IR WITH ANESTHESIA;  Surgeon: Radiologist, Medication, MD;  Location: [facility] OR;  Service: Radiology;  Laterality: N/A;   TONSILLECTOMY AND ADENOIDECTOMY   1975   TUBAL LIGATION   1999         Family History  Problem Relation Age of Onset   Other Mother     Other Father     Hypertension Father      Social History:  reports that she has never smoked. She has never used smokeless tobacco. She reports current alcohol use of about 6.0 standard drinks of alcohol per week. She reports that she does not use drugs. Allergies:       Allergies  Allergen Reactions   Doxycycline Nausea Only      malaise    Erythromycin Nausea And Vomiting   Iodine        unknown   Latex Other (See Comments)      Irritation of the skin   Shellfish Allergy Other (See Comments)      GI- Facial  Acne   Strawberry Extract Other (See Comments)      Mouth ulcers     Vancomycin     Vicodin [Hydrocodone-Acetaminophen] Other (See Comments)      Restless, tolerate percocet   Dilaudid [Hydromorphone Hcl] Rash      *Pt states she can take if given benadryl prior*   Keflex [Cephalexin] Rash   Penicillins Rash      Hives   Sulfa Antibiotics Rash      Hives          Medications Prior to Admission  Medication Sig Dispense Refill   ALPRAZolam (XANAX) 0.5 MG tablet Take 0.5 mg by mouth at bedtime.       apixaban (ELIQUIS) 5 MG TABS tablet Take 1 tablet (5 mg total) by mouth 2 (two) times daily. 60 tablet 1   cyclobenzaprine (FLEXERIL) 10 MG tablet Take 1 tablet (10 mg total) by mouth 3 (three) times daily as needed for muscle spasms. 90 tablet 5   guaiFENesin (MUCINEX) 600 MG 12 hr tablet Take 600 mg by mouth daily as needed for cough or to loosen phlegm.       loratadine (CLARITIN) 10 MG tablet Take 10 mg by mouth every morning.       MYRBETRIQ 25 MG TB24 tablet Take 25 mg by mouth daily.       pregabalin (LYRICA) 25  MG capsule Take 1 capsule (25 mg total) by mouth 2 (two) times daily. 60 capsule 0   rosuvastatin (CRESTOR) 20 MG tablet Take 1 tablet (20 mg total) by mouth daily. 30 tablet 1   sacubitril-valsartan (ENTRESTO) 24-26 MG Take 1 tablet by mouth 2 (two) times daily. 60 tablet 1          Home: Home Living Family/patient expects to be discharged to:: Private residence Living Arrangements:  Spouse/significant other Available Help at Discharge: Family, Friend(s), Available 24 hours/day Type of Home: House Home Access: Stairs to enter Secretary/administrator of Steps: 5 Entrance Stairs-Rails: Left Home Layout: Two level Alternate Level Stairs-Number of Steps: flight Alternate Level Stairs-Rails: Right Bathroom Shower/Tub: Psychologist, counselling, Engineer, manufacturing systems: Standard Bathroom Accessibility: Yes Home Equipment: Agricultural consultant (2 wheels), Rollator (4 wheels), Cane - single point, Environmental education officer Comments: Husband works, able to take time off initially if needed  Lives With: Spouse   Functional History: Prior Function Prior Level of Function : Independent/Modified Independent Mobility Comments: no AD ADLs Comments: works, drives   Functional Status:  Mobility: Bed Mobility Overal bed mobility: Modified Independent Bed Mobility: Supine to Sit, Sit to Supine Supine to sit: Modified independent (Device/Increase time), HOB elevated Sit to supine: Modified independent (Device/Increase time), HOB elevated General bed mobility comments: HOB elevated, no assistance needed Transfers Overall transfer level: Needs assistance Equipment used: None Transfers: Sit to/from Stand Sit to Stand: Supervision General transfer comment: Supervision for safety, no LOB Ambulation/Gait Ambulation/Gait assistance: Modified independent (Device/Increase time), Supervision Gait Distance (Feet): 160 Feet Assistive device: None Gait Pattern/deviations: WFL(Within Functional Limits) General  Gait Details: Pt ambulating at a functional pace without LOB, even with DGI challenges. Progressed from supervision initially for safety to mod I. Holds R UE up in elbow flexion and close to abdomen due to biceps spasticity. Gait velocity: reduced Gait velocity interpretation: >4.37 ft/sec, indicative of normal walking speed Stairs: Yes Stairs assistance: Supervision Stair Management: No rails, Alternating pattern, Forwards Number of Stairs: 3 General stair comments: Ascends and descends stairs without LOB, supervision for safety   ADL: ADL Overall ADL's : Needs assistance/impaired Eating/Feeding: NPO Grooming: Minimal assistance, Moderate assistance, Standing Grooming Details (indicate cue type and reason): focus on integration of R hand/ue as much as possible during tasks Upper Body Bathing: Minimal assistance, Sitting Lower Body Bathing: Minimal assistance, Sit to/from stand Upper Body Dressing : Minimal assistance, Sitting Lower Body Dressing: Minimal assistance, Sit to/from stand Toilet Transfer: Min guard, Ambulation Toileting- Clothing Manipulation and Hygiene: Supervision/safety, Sitting/lateral lean Functional mobility during ADLs: Min guard General ADL Comments: session focused on RUE AROM, stretch   Cognition: Cognition Overall Cognitive Status: Difficult to assess Arousal/Alertness: Awake/alert Orientation Level: Oriented X4 Focused Attention: Appears intact Sustained Attention: Appears intact Memory:  (unable to assess) Awareness:  (UTA) Problem Solving:  (UTA) Safety/Judgment: Other (comment) Comments: UTA secondary to nonverbal Cognition Arousal/Alertness: Awake/alert Behavior During Therapy: WFL for tasks assessed/performed Overall Cognitive Status: Difficult to assess General Comments: pt with expressive commuincation deficits, using commuincation board as needed to express needs but also able to voice several words including "thank you". Difficult to assess  due to: Impaired communication   Physical Exam: Blood pressure 107/71, pulse 86, temperature (!) 97.4 F (36.3 C), temperature source Oral, resp. rate 17, height 5\' 1"  (1.549 m), weight 59 kg, SpO2 100 %.    General: Alert and oriented x 4, No apparent distress HEENT: Head is normocephalic, atraumatic, PERRLA, EOMI, sclera anicteric, oral mucosa pink and moist, dentition intact, wearing glasses Neck: Supple without JVD or lymphadenopathy Heart: Reg rate and rhythm. No murmurs rubs or gallops Chest: CTA bilaterally without wheezes, rales, or rhonchi; no distress Abdomen: Soft, non-tender, non-distended, bowel sounds positive. Extremities: No clubbing, cyanosis, or edema. Pulses are 2+ Psych: Pt's affect is appropriate. Pt is cooperative, Very pleasant Skin: Clean and intact without signs of breakdown Neuro:  Expressive aphasia, follows commands, able to use communication board,  able to say a few words now, able to name 1/4 objects, not able to repeat, Right facial weakness, decrease sensation reported around her R mouth and tongue, PERRLA, EOMI, Right facial weakness present, vision intact and hearing intact to finger rub, Tongue deviates right, sensation altered in RUE to light touch, normal sensation to LT in LUE, LLE and RLE.  Strength 5/5 in LLE and LUE Strength 5/5 in RLE Strength 4/5 L shoulder abduction, 3/5 elbow flexion and extension, 0/5 wrist extension, 1-2/5 finger flexion  Left FTN Intact Musculoskeletal:  Full ROM, no joint swelling noted, no tone decrease R wrist  Ecchymosis left inguinal area with dressing CDI     Lab Results Last 48 Hours        Results for orders placed or performed during the hospital encounter of 01/28/22 (from the past 48 hour(s))  Urinalysis, Routine w reflex microscopic Urine, Clean Catch     Status: Abnormal    Collection Time: 02/02/22  1:10 PM  Result Value Ref Range    Color, Urine AMBER (A) YELLOW      Comment: BIOCHEMICALS MAY BE AFFECTED  BY COLOR    APPearance HAZY (A) CLEAR    Specific Gravity, Urine 1.020 1.005 - 1.030    pH 7.0 5.0 - 8.0    Glucose, UA NEGATIVE NEGATIVE mg/dL    Hgb urine dipstick NEGATIVE NEGATIVE    Bilirubin Urine NEGATIVE NEGATIVE    Ketones, ur NEGATIVE NEGATIVE mg/dL    Protein, ur 30 (A) NEGATIVE mg/dL    Nitrite POSITIVE (A) NEGATIVE    Leukocytes,Ua TRACE (A) NEGATIVE    RBC / HPF 0-5 0 - 5 RBC/hpf    WBC, UA 21-50 0 - 5 WBC/hpf    Bacteria, UA RARE (A) NONE SEEN    Squamous Epithelial / LPF 0-5 0 - 5    Mucus PRESENT      Hyaline Casts, UA PRESENT        Comment: Performed at [facility], [address], [place], [place] [address]  CBC     Status: Abnormal    Collection Time: 02/03/22  5:30 AM  Result Value Ref Range    WBC 4.4 4.0 - 10.5 K/uL    RBC 3.31 (L) 3.87 - 5.11 MIL/uL    Hemoglobin 9.7 (L) 12.0 - 15.0 g/dL    HCT 35.0 (L) 09.3 - 46.0 %    MCV 86.4 80.0 - 100.0 fL    MCH 29.3 26.0 - 34.0 pg    MCHC 33.9 30.0 - 36.0 g/dL    RDW 81.8 29.9 - 37.1 %    Platelets 184 150 - 400 K/uL    nRBC 0.0 0.0 - 0.2 %      Comment: Performed at [facility], [address] [place], [place] [address]  Protime-INR     Status: Abnormal    Collection Time: 02/03/22  5:30 AM  Result Value Ref Range    Prothrombin Time 23.4 (H) 11.4 - 15.2 seconds    INR 2.1 (H) 0.8 - 1.2      Comment: (NOTE) INR goal varies based on device and disease states. Performed at [facility], [address], [place], [place] [address]    Phosphorus     Status: None    Collection Time: 02/03/22  5:30 AM  Result Value Ref Range    Phosphorus 4.1 2.5 - 4.6 mg/dL      Comment: Performed at [facility]  Lab, [address], [place], [place] [address]  Magnesium     Status: None    Collection Time: 02/03/22  5:30 AM  Result Value Ref Range    Magnesium 2.1 1.7 - 2.4 mg/dL      Comment: Performed at [facility], [address], [place], [place] [address]  Basic metabolic panel      Status: Abnormal    Collection Time: 02/03/22  5:30 AM  Result Value Ref Range    Sodium 140 135 - 145 mmol/L    Potassium 3.5 3.5 - 5.1 mmol/L    Chloride 109 98 - 111 mmol/L    CO2 25 22 - 32 mmol/L    Glucose, Bld 108 (H) 70 - 99 mg/dL      Comment: Glucose reference range applies only to samples taken after fasting for at least 8 hours.    BUN 5 (L) 8 - 23 mg/dL    Creatinine, Ser 8.29 0.44 - 1.00 mg/dL    Calcium 8.9 8.9 - 56.2 mg/dL    GFR, Estimated >13 >08 mL/min      Comment: (NOTE) Calculated using the CKD-EPI Creatinine Equation (2021)      Anion gap 6 5 - 15      Comment: Performed at [facility], [address] [place], [place] [address]  Protime-INR     Status: Abnormal    Collection Time: 02/04/22  5:00 AM  Result Value Ref Range    Prothrombin Time 31.1 (H) 11.4 - 15.2 seconds    INR 3.0 (H) 0.8 - 1.2      Comment: (NOTE) INR goal varies based on device and disease states. Performed at [facility], [address] [place], [place] [address]         Imaging Results (Last 48 hours)  VAS [place] GROIN PSEUDOANEURYSM   Result Date: 02/03/2022  ARTERIAL PSEUDOANEURYSM  Patient Name:  Chloe Johnson  Date of Exam:   02/02/2022 Medical Rec #: [id number]     Accession #:    [id number] Date of Birth: [birth year]     Patient Gender: F Patient Age:   40 years Exam Location:  [facility] Procedure:      VAS [place] Bobetta Lime Referring Phys: Alwyn Ren --------------------------------------------------------------------------------  Exam: Left groin Indications: Patient complains of bruising. History: S/p catheterization. Comparison Study: No prior study Performing Technologist: Gertie Fey MHA, RDMS, RVT, RDCS  Examination Guidelines: A complete evaluation includes B-mode imaging, spectral Doppler, color Doppler, and power Doppler as needed of all accessible portions of each vessel. Bilateral testing is considered an integral part of a complete  examination. Limited examinations for reoccurring indications may be performed as noted. +-----------+----------+---------+------+----------+ Left DuplexPSV (cm/s)Waveform PlaqueComment(s) +-----------+----------+---------+------+----------+ Ext Iliac     166    triphasic                 +-----------+----------+---------+------+----------+ CFA            66    triphasic                 +-----------+----------+---------+------+----------+ PFA            73    triphasic                 +-----------+----------+---------+------+----------+ Prox SFA       77    triphasic                 +-----------+----------+---------+------+----------+ Left  Vein comments: left CFV patent  Summary: No evidence of pseudoaneurysm, AVF or DVT. A mixed echogenic structure measuring approximately 1.0 cm x 0.9 cm is visualized at the left groin with ultrasound characteristics of a hematoma.  Diagnosing physician: Sherald Hess MD Electronically signed by Sherald Hess MD on 02/03/2022 at 1:41:04 PM.    --------------------------------------------------------------------------------    Final           Blood pressure 107/71, pulse 86, temperature (!) 97.4 F (36.3 C), temperature source Oral, resp. rate 17, height 5\' 1"  (1.549 m), weight 59 kg, SpO2 100 %.   Medical Problem List and Plan: 1. Functional deficits secondary to left MCA punctate scattered infarct with left M2 occlusion status post IR with revascularization, embolic secondary to LV thrombus recently on Eliquis             -patient may shower             -ELOS/Goals: 7-10 days, Supervision PT/OT, Min-Mod with SLP  -Admit to CIR 2.  Antithrombotics: -DVT/anticoagulation:  Pharmaceutical: Coumadin             -antiplatelet therapy: Aspirin 81 mg daily 3. Pain Management/chronic neck and back pain: Lyrica 25 mg twice daily  -She indicates she was on 100mg  BID lyrica at home? Although review of records indicates she was prescribed  25mg  BID.  She says it was started for post herpetic neuralgia but it helped her back and neck pain. Consider increase dose if needed for improved pain management.  4. Mood/Behavior/Sleep: Xanax 0.5 mg nightly             -antipsychotic agents: N/A 5. Neuropsych/cognition: This patient is not capable of making decisions on her own behalf. 6. Skin/Wound Care: Routine skin checks 7. Fluids/Electrolytes/Nutrition: Routine-output follow-up chemistries 8.  UTI.  3 day course of Rocephin. She completes this 9/13, follow urine culture 9.  Essential hypertension.  Entresto 24-26 mg twice daily -well controlled overall, avoid hypotension 10.  Follow congestive heart failure.  Continue Entresto.  Monitor for any signs fluid overload. Daily weights  Filed Weights   02/04/22 1726  Weight: 57 kg    11.  Hyperlipidemia.  Crestor 20mg  daily 12.  UDS positive marijuana.  Provide counseling 13. Constipation  -Continue miralax daily, continue colace 14. Anemia  -Recheck CBC 15. Likely hematoma L groin, VAS completed, monitor for bleeding   10/13, PA-C 02/04/2022   I have personally performed a face to face diagnostic evaluation of this patient and formulated the key components of the plan.  Additionally, I have personally reviewed laboratory data, imaging studies, as well as relevant notes and concur with the physician assistant's documentation above.  The patient's status has not changed from the original H&P.  Any changes in documentation from the acute care chart have been noted above.  , MD, [place]

## 2022-02-04 NOTE — Progress Notes (Addendum)
STROKE TEAM PROGRESS NOTE   SUBJECTIVE (INTERVAL HISTORY) Daughter  is at the bedside.  Hematoma Stable this am.  on rocephin for UTI. Day 2/3. Causing nausea. Will add compazine today.  D/C PICC.  Pharmacy following: on coumadin and lovenox $RemoveBef'60mg'SHdpUxsZHh$  BID bridge.  Daughter had many questions for me today about stroke recurrence.   D/c today to rehab.   OBJECTIVE Temp:  [97.4 F (36.3 C)-98.7 F (37.1 C)] 97.4 F (36.3 C) (09/12 0915) Pulse Rate:  [70-86] 86 (09/12 0915) Cardiac Rhythm: Sinus tachycardia (09/12 0718) Resp:  [14-17] 17 (09/12 0915) BP: (92-127)/(65-87) 107/71 (09/12 0915) SpO2:  [98 %-100 %] 100 % (09/12 0915)  Recent Labs  Lab 01/28/22 1328 01/28/22 1747  GLUCAP 125* 103*    Recent Labs  Lab 01/30/22 1620 01/31/22 0415 02/01/22 0700 02/02/22 0445 02/03/22 0530  NA 138 139 141 142 140  K 3.5 3.1* 3.4* 3.4* 3.5  CL 109 109 110 109 109  CO2 $Re'22 24 25 25 25  'oqD$ GLUCOSE 98 92 109* 112* 108*  BUN <5* <5* <5* 6* 5*  CREATININE 0.56 0.61 0.66 0.57 0.59  CALCIUM 8.6* 8.6* 8.6* 8.8* 8.9  MG 1.9  --   --   --  2.1  PHOS 1.8*  --   --   --  4.1    Recent Labs  Lab 01/28/22 1330  AST 22  ALT 18  ALKPHOS 45  BILITOT 0.3  PROT 6.4*  ALBUMIN 3.9    Recent Labs  Lab 01/28/22 1330 01/28/22 1337 01/29/22 0300 01/30/22 0143 01/31/22 0415 02/01/22 0700 02/02/22 0445 02/03/22 0530  WBC 4.7  --  8.6 5.3 5.2 4.8 4.5 4.4  NEUTROABS 2.5  --  6.1  --   --   --   --   --   HGB 12.0   < > 11.2* 9.2* 9.3* 9.4* 9.5* 9.7*  HCT 35.5*   < > 33.3* 27.3* 28.0* 28.1* 27.9* 28.6*  MCV 88.5  --  87.6 86.4 87.0 86.7 86.1 86.4  PLT 171  --  173 143* 157 178 183 184   < > = values in this interval not displayed.    No results for input(s): "CKTOTAL", "CKMB", "CKMBINDEX", "TROPONINI" in the last 168 hours. Recent Labs    02/02/22 0445 02/03/22 0530 02/04/22 0500  LABPROT 19.6* 23.4* 31.1*  INR 1.7* 2.1* 3.0*    Recent Labs    02/02/22 1310  COLORURINE  AMBER*  LABSPEC 1.020  PHURINE 7.0  GLUCOSEU NEGATIVE  HGBUR NEGATIVE  BILIRUBINUR NEGATIVE  KETONESUR NEGATIVE  PROTEINUR 30*  NITRITE POSITIVE*  LEUKOCYTESUR TRACE*        Component Value Date/Time   CHOL 223 (H) 01/22/2022 0351   TRIG 271 (H) 01/29/2022 0300   HDL 40 (L) 01/22/2022 0351   CHOLHDL 5.6 01/22/2022 0351   VLDL 33 01/22/2022 0351   LDLCALC 150 (H) 01/22/2022 0351   Lab Results  Component Value Date   HGBA1C 5.0 01/22/2022      Component Value Date/Time   LABOPIA NONE DETECTED 01/28/2022 1831   COCAINSCRNUR NONE DETECTED 01/28/2022 1831   LABBENZ NONE DETECTED 01/28/2022 1831   AMPHETMU NONE DETECTED 01/28/2022 1831   THCU POSITIVE (A) 01/28/2022 1831   LABBARB NONE DETECTED 01/28/2022 1831    Recent Labs  Lab 01/28/22 1330  ETH <10     I have personally reviewed the radiological images below and agree with the radiology interpretations.  VAS [place] GROIN PSEUDOANEURYSM  Result Date:  02/03/2022  ARTERIAL PSEUDOANEURYSM  Patient Name:  CYNDEE GIAMMARCO Mccormick  Date of Exam:   02/02/2022 Medical Rec #: [id number]     Accession #:    [id number] Date of Birth: 1955-10-24     Patient Gender: F Patient Age:   45 years Exam Location:  [facility] Procedure:      VAS [place] GROIN PSEUDOANEURYSM Referring Phys: Soyla Dryer --------------------------------------------------------------------------------  Exam: Left groin Indications: Patient complains of bruising. History: S/p catheterization. Comparison Study: No prior study Performing Technologist: Maudry Mayhew MHA, RDMS, RVT, RDCS  Examination Guidelines: A complete evaluation includes B-mode imaging, spectral Doppler, color Doppler, and power Doppler as needed of all accessible portions of each vessel. Bilateral testing is considered an integral part of a complete examination. Limited examinations for reoccurring indications may be performed as noted. +-----------+----------+---------+------+----------+ Left  DuplexPSV (cm/s)Waveform PlaqueComment(s) +-----------+----------+---------+------+----------+ Ext Iliac     166    triphasic                 +-----------+----------+---------+------+----------+ CFA            66    triphasic                 +-----------+----------+---------+------+----------+ PFA            73    triphasic                 +-----------+----------+---------+------+----------+ Prox SFA       77    triphasic                 +-----------+----------+---------+------+----------+ Left Vein comments: left CFV patent  Summary: No evidence of pseudoaneurysm, AVF or DVT. A mixed echogenic structure measuring approximately 1.0 cm x 0.9 cm is visualized at the left groin with ultrasound characteristics of a hematoma.  Diagnosing physician: Monica Martinez MD Electronically signed by Monica Martinez MD on 02/03/2022 at 1:41:04 PM.    --------------------------------------------------------------------------------    Final    DG Swallowing Func-Speech Pathology  Result Date: 01/30/2022 Table formatting from the original result was not included. Images from the original result were not included. Objective Swallowing Evaluation: Type of Study: MBS-Modified Barium Swallow Study  Patient Details Name: JENEVIEVE KIRSCHBAUM MRN: [id number] Date of Birth: July 16, 1955 Today's Date: 01/30/2022 Time: SLP Start Time (ACUTE ONLY): 1135 -SLP Stop Time (ACUTE ONLY): 1200 SLP Time Calculation (min) (ACUTE ONLY): 25 min Past Medical History: Past Medical History: Diagnosis Date  Anxiety   Back pain   Discitis of lumbar region 11/16/2013  L3-4/notes 11/24/2013, arms, neck  Exertional asthma   GERD (gastroesophageal reflux disease)   Hypertension   Kidney stones   "have always passed them"  Neck pain   Osteomyelitis ([place]) 11/23/2013  osteomyelitis, discitis   Sleep concern   uses Prozac for sleep  Past Surgical History: Past Surgical History: Procedure Laterality Date  BREAST CYST EXCISION Left 2010   "polypectomy"  IR CT HEAD LTD  01/21/2022  IR CT HEAD LTD  01/28/2022  IR PERCUTANEOUS ART THROMBECTOMY/INFUSION INTRACRANIAL INC DIAG ANGIO  01/21/2022  IR PERCUTANEOUS ART THROMBECTOMY/INFUSION INTRACRANIAL INC DIAG ANGIO  01/28/2022  IR US GUIDE VASC ACCESS RIGHT  01/22/2022  PICC LINE PLACE PERIPHERAL ([place] HX) Right   for use of Levaquin & Vancomycin, of note: she reports that she had a "flulike feeling"   RADIOLOGY WITH ANESTHESIA N/A 01/21/2022  Procedure: IR WITH ANESTHESIA;  Surgeon: Luanne Bras, MD;  Location: [place];  Service: Radiology;  Laterality: N/A;  RADIOLOGY  WITH ANESTHESIA N/A 01/28/2022  Procedure: IR WITH ANESTHESIA;  Surgeon: Radiologist, Medication, MD;  Location: [place];  Service: Radiology;  Laterality: N/A;  TONSILLECTOMY AND ADENOIDECTOMY  1975  TUBAL LIGATION  1999 HPI: Patient is a 66 y.o. 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 On 01/25/22, patient began to have symptoms of dizziness followed by nausea and vomiting. Symptoms improved but then on 9/5 but she felt generally weak and was having trouble finding words. Husband observed right facial droop. She presented to [facility] ED as code stroke on 01/28/22; CT head without acute abnormality but CTA unable to be performed due to IV infiltration. She was taken directly to IR for thrombectomy and intubated for this procedure, extubated in morning of 01/29/22. MRI brain showed new acute infarcts in left posterior frontal and anterior parietal cortex.  Subjective: awake, alert, husband and two daughters present in room  Recommendations for follow up therapy are one component of a multi-disciplinary discharge planning process, led by the attending physician.  Recommendations may be updated based on patient status, additional functional criteria and insurance authorization. Assessment / Plan  / Recommendation   01/30/2022   1:15 PM Clinical Impressions Clinical Impression Pt demonstrates oropharygneal dysphagia with mild oral impairment secondary to right CN VII and XII weakness and potential right sided sensory changes. Pt has decreased labial seal on the right with anterior spillage, improved with cues to increase labial tension with straw or cup. There is also decreased bolus cohesion and mild lingual residue unless cued for an oral hold. Pt initiates swallow as the bolus arrives past the valleculae resulting in instances of flash penetration or sensed aspiration (consecutive straw sips). Pt improved with single sips and oral hold prior to swallow. There was questionable trace silent aspiration of light barium pooled in pyriforms; penetrated post swallow. However this finding is inconclusive and  likely negligible given pts sensation and ejection of mild aspirate in prior event. Recommend initiating nectar thick liquids and dys 3 solids to aid in pt relearning swallowing motor patterns with a slower, more manageable texture prior to advancement to thin in 1-2 days if successful. 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);Dysphagia, unspecified (R13.10) Impact on safety and function Mild aspiration risk     01/30/2022   1:15 PM Treatment Recommendations Treatment Recommendations Therapy as outlined in treatment plan below     01/30/2022   1:15 PM Prognosis Prognosis for Safe Diet Advancement Good   01/30/2022   1:15 PM Diet Recommendations SLP Diet Recommendations Nectar thick liquid;Thin liquid;Dysphagia 3 (Mech soft) solids Liquid Administration via Cup;Straw Medication Administration Whole meds with puree Compensations Slow rate;Small sips/bites;Clear throat intermittently Postural Changes Remain semi-upright after after feeds/meals (Comment);Seated upright at 90 degrees     01/30/2022   1:15 PM Other Recommendations Oral Care Recommendations Oral care BID Other Recommendations Have oral suction available Follow Up  Recommendations Acute inpatient rehab (3hours/day) Assistance recommended at discharge Frequent or constant Supervision/Assistance Functional Status Assessment Patient has had a recent decline in their functional status and demonstrates the ability to make significant improvements in function in a reasonable and predictable amount of time.   01/30/2022   1:15 PM Frequency and Duration  Speech Therapy Frequency (ACUTE ONLY) min 2x/week Treatment Duration 2 weeks     01/30/2022   1:15 PM Oral Phase Oral Phase Impaired Oral - Thin  Cup Decreased bolus cohesion Oral - Thin Straw Decreased bolus cohesion;Premature spillage;Lingual/palatal residue Oral - Puree WFL Oral - Regular Weak lingual manipulation;Lingual/palatal residue    01/30/2022   1:15 PM Pharyngeal Phase Pharyngeal Phase Impaired Pharyngeal- Nectar Cup Pharyngeal residue - pyriform Pharyngeal- Nectar Straw Pharyngeal residue - pyriform Pharyngeal- Thin Cup Pharyngeal residue - pyriform;Penetration/Aspiration during swallow;Penetration/Aspiration before swallow Pharyngeal Material enters airway, CONTACTS cords and not ejected out;Material enters airway, remains ABOVE vocal cords then ejected out;Material does not enter airway Pharyngeal- Thin Straw Delayed swallow initiation-pyriform sinuses;Penetration/Aspiration before swallow;Penetration/Apiration after swallow;Pharyngeal residue - pyriform;Trace aspiration Pharyngeal Material enters airway, passes BELOW cords then ejected out;Material enters airway, CONTACTS cords and not ejected out;Material does not enter airway     No data to display    Herbie Baltimore, MA CCC-SLP Acute Rehabilitation Services Secure Chat Preferred Office [phone] Lynann Beaver 01/30/2022, 1:48 PM                     IR PERCUTANEOUS ART THROMBECTOMY/INFUSION INTRACRANIAL INC DIAG ANGIO  Result Date: 01/30/2022 INDICATION: 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 EXAM: 1. EMERGENT LARGE VESSEL OCCLUSION THROMBOLYSIS  (anterior CIRCULATION) COMPARISON:  None Available. MEDICATIONS: No antibiotic was administered within 1 hour of the procedure. ANESTHESIA/SEDATION: General anesthesia. CONTRAST:  Omnipaque 300 150 mL. FLUOROSCOPY TIME:  Fluoroscopy Time: 39 minutes 12 seconds (1289 mGy). COMPLICATIONS: None immediate. TECHNIQUE: Following a full explanation of the procedure along with the potential associated complications, an informed witnessed consent was obtained. The risks of intracranial hemorrhage of 10%, worsening neurological deficit, ventilator dependency, death and inability to revascularize were all reviewed in detail with the patient's spouse. The patient was then put under general anesthesia by the Department of Anesthesiology at [facility]. The left groin was prepped and draped in the usual sterile fashion. Thereafter using modified Seldinger technique, transfemoral access into the left common femoral artery was obtained without difficulty. Over a 0.035 inch guidewire an 8 French 25 cm Pinnacle sheath was inserted. Through this, and also over a 0.035 inch guidewire a combination of a wrist Simmons 2 support catheter inside of an 087 balloon guide catheter was advanced to the aortic region, and selectively positioned initially in the left common carotid artery and then advanced to the distal cervical left ICA over an 035 inch guidewire with support catheter. The guidewire was removed. Good aspiration obtained hub of the balloon guide catheter. Arteriogram was then performed proximally from the common carotid artery then the distal left internal carotid artery. FINDINGS: The left common carotid arteriogram demonstrates the left external carotid artery and its major branches to be widely patent. The left internal carotid artery at bulb to the cranial skull base demonstrates wide patency. Wide patency is noted of the petrous, the cavernous and the supraclinoid segments. Opacification of the left posterior  cerebral artery via the left posterior communicating artery is noted. The left middle cerebral artery M1 segment is widely patent. The angiographic occlusion of the superior division mid M2 segment is noted. The left MCA inferior division opacifies into the capillary and venous phases. The left anterior cerebral artery opacifies into the capillary and venous phases. Transient cross-filling via the anterior communicating artery of the right anterior cerebral artery A2 segment and the A1 segment is evident. PROCEDURE: Over an Aristotle softip micro guidewire with a J configuration, a combination of a 132 cm Zoom aspiration catheter inside of which was a 160 cm Phenom microcatheter was advanced to the proximal left  middle cerebral artery. Using a torque device, access was obtained with the micro guidewire through the occluded superior division into the proximal M3 segment followed by the microcatheter. The guidewire was removed. Good aspiration was obtained from the hub of the microcatheter. A gentle contrast injection demonstrated slow antegrade flow of contrast distally. A 3 mm x 36 mm retrieval device was then advanced to the distal end of the microcatheter and deployed in the usual manner. At this time the Zoom aspiration catheter was advanced into the proximal superior division. With proximal flow arrest in the left internal carotid artery constant aspiration was applied at the hub of the Zoom aspiration catheter and the aspiration device 2 minutes of the balloon guide catheter with a 20 mL syringe. Combination of the retrieval, the catheter and the Zoom aspiration catheter was retrieved removed. Following reversal of flow arrest, control arteriogram with the balloon guide catheter in the left internal carotid artery demonstrated near complete opacification of the superior division with gradual improved flow into the M3 M4 segment of the superior division. Moderate spasm noted in the proximal superior division  responded to 5 aliquotes of 25 mcg of nitroglycerin intra-arterially. Also noted was a small non flow limiting filling defect in a mid parietal branch of the inferior division in the distal M3 M4 region which also gradually started clearing over a period of 20 minutes. Control arteriogram performed through the balloon guide catheter in the mid cervical left demonstrated near complete revascularization of the left MCA achieving a TICI 2C revascularization. A control arteriogram performed through the left common carotid artery demonstrated small dissection of the distal left internal carotid artery at the skull base medially without any flow limitation. The balloon guide catheter was removed. Diagnostic catheter arteriogram was then performed of the right common carotid artery and the right vertebral artery. These demonstrated the right external carotid artery and its major branches to be widely patent. Internal carotid artery at the bulb to the cranial skull base demonstrates wide patency. The petrous, the cavernous and the supraclinoid segments demonstrate wide patency as do the right middle cerebral artery and the right anterior cerebral artery into the capillary and venous phases. The right vertebral artery demonstrates wide patency. More distally, patency is maintained of the basilar junction in the right posterior-inferior cerebellar artery. The basilar artery, the posterior cerebral arteries, the superior cerebellar arteries and the anterior-inferior cerebellar arteries demonstrate patency into the capillary and venous phases. Unopacified blood was noted in the basilar artery from the contralateral left vertebral artery. An 8 French Angio-Seal closure device demonstrates hemostasis at the left groin puncture site. Distal pulses remained palpable in both feet unchanged. A CT of the brain demonstrated no hemorrhagic complications. The patient was left intubated due to poor responsiveness following reversal of the  anesthesia. Patient was then transferred to the neuro ICU post revascularization care. IMPRESSION: Status post endovascular revascularization of occluded division of the left middle cerebral artery achieving a TICI 2C revascularization of the left MCA distribution. Puncture to first pass approximately 36 mins. PLAN: Follow-up as per referring MD. Electronically Signed   By: Luanne Bras M.D.   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 EXAM: 1. EMERGENT LARGE VESSEL OCCLUSION THROMBOLYSIS (anterior CIRCULATION) COMPARISON:  None Available. MEDICATIONS: No antibiotic was administered within 1 hour of the procedure. ANESTHESIA/SEDATION: General anesthesia. CONTRAST:  Omnipaque 300 150 mL. FLUOROSCOPY TIME:  Fluoroscopy Time: 39  minutes 12 seconds (1289 mGy). COMPLICATIONS: None immediate. TECHNIQUE: Following a full explanation of the procedure along with the potential associated complications, an informed witnessed consent was obtained. The risks of intracranial hemorrhage of 10%, worsening neurological deficit, ventilator dependency, death and inability to revascularize were all reviewed in detail with the patient's spouse. The patient was then put under general anesthesia by the Department of Anesthesiology at [facility]. The left groin was prepped and draped in the usual sterile fashion. Thereafter using modified Seldinger technique, transfemoral access into the left common femoral artery was obtained without difficulty. Over a 0.035 inch guidewire an 8 French 25 cm Pinnacle sheath was inserted. Through this, and also over a 0.035 inch guidewire a combination of a wrist Simmons 2 support catheter inside of an 087 balloon guide catheter was advanced to the aortic region, and selectively positioned initially in the left common carotid artery and then advanced to the distal cervical left ICA over an 035 inch  guidewire with support catheter. The guidewire was removed. Good aspiration obtained hub of the balloon guide catheter. Arteriogram was then performed proximally from the common carotid artery then the distal left internal carotid artery. FINDINGS: The left common carotid arteriogram demonstrates the left external carotid artery and its major branches to be widely patent. The left internal carotid artery at bulb to the cranial skull base demonstrates wide patency. Wide patency is noted of the petrous, the cavernous and the supraclinoid segments. Opacification of the left posterior cerebral artery via the left posterior communicating artery is noted. The left middle cerebral artery M1 segment is widely patent. The angiographic occlusion of the superior division mid M2 segment is noted. The left MCA inferior division opacifies into the capillary and venous phases. The left anterior cerebral artery opacifies into the capillary and venous phases. Transient cross-filling via the anterior communicating artery of the right anterior cerebral artery A2 segment and the A1 segment is evident. PROCEDURE: Over an Aristotle softip micro guidewire with a J configuration, a combination of a 132 cm Zoom aspiration catheter inside of which was a 160 cm Phenom microcatheter was advanced to the proximal left middle cerebral artery. Using a torque device, access was obtained with the micro guidewire through the occluded superior division into the proximal M3 segment followed by the microcatheter. The guidewire was removed. Good aspiration was obtained from the hub of the microcatheter. A gentle contrast injection demonstrated slow antegrade flow of contrast distally. A 3 mm x 36 mm retrieval device was then advanced to the distal end of the microcatheter and deployed in the usual manner. At this time the Zoom aspiration catheter was advanced into the proximal superior division. With proximal flow arrest in the left internal carotid  artery constant aspiration was applied at the hub of the Zoom aspiration catheter and the aspiration device 2 minutes of the balloon guide catheter with a 20 mL syringe. Combination of the retrieval, the catheter and the Zoom aspiration catheter was retrieved removed. Following reversal of flow arrest, control arteriogram with the balloon guide catheter in the left internal carotid artery demonstrated near complete opacification of the superior division with gradual improved flow into the M3 M4 segment of the superior division. Moderate spasm noted in the proximal superior division responded to 5 aliquotes of 25 mcg of nitroglycerin intra-arterially. Also noted was a small non flow limiting filling defect in a mid parietal branch of the inferior division in the distal M3 M4 region which also gradually started clearing over  a period of 20 minutes. Control arteriogram performed through the balloon guide catheter in the mid cervical left demonstrated near complete revascularization of the left MCA achieving a TICI 2C revascularization. A control arteriogram performed through the left common carotid artery demonstrated small dissection of the distal left internal carotid artery at the skull base medially without any flow limitation. The balloon guide catheter was removed. Diagnostic catheter arteriogram was then performed of the right common carotid artery and the right vertebral artery. These demonstrated the right external carotid artery and its major branches to be widely patent. Internal carotid artery at the bulb to the cranial skull base demonstrates wide patency. The petrous, the cavernous and the supraclinoid segments demonstrate wide patency as do the right middle cerebral artery and the right anterior cerebral artery into the capillary and venous phases. The right vertebral artery demonstrates wide patency. More distally, patency is maintained of the basilar junction in the right posterior-inferior cerebellar  artery. The basilar artery, the posterior cerebral arteries, the superior cerebellar arteries and the anterior-inferior cerebellar arteries demonstrate patency into the capillary and venous phases. Unopacified blood was noted in the basilar artery from the contralateral left vertebral artery. An 8 French Angio-Seal closure device demonstrates hemostasis at the left groin puncture site. Distal pulses remained palpable in both feet unchanged. A CT of the brain demonstrated no hemorrhagic complications. The patient was left intubated due to poor responsiveness following reversal of the anesthesia. Patient was then transferred to the neuro ICU post revascularization care. IMPRESSION: Status post endovascular revascularization of occluded division of the left middle cerebral artery achieving a TICI 2C revascularization of the left MCA distribution. Puncture to first pass approximately 36 mins. PLAN: Follow-up as per referring MD. Electronically Signed   By: Luanne Bras M.D.   On: 01/30/2022 08:42   MR BRAIN WO CONTRAST  Result Date: 01/28/2022 CLINICAL DATA:  Right MCA stroke status post thrombectomy, follow-up EXAM: MRI HEAD WITHOUT CONTRAST MRA HEAD WITHOUT CONTRAST TECHNIQUE: Multiplanar, multi-echo pulse sequences of the brain and surrounding structures were acquired without intravenous contrast. Angiographic images of the Circle of Willis were acquired using MRA technique without intravenous contrast. COMPARISON:  01/22/2022 MRI head, no prior MRA, correlation is made with CTA head and neck 01/21/2022 FINDINGS: Brain: New foci of restricted diffusion with ADC correlate in the left posterior frontal and anterior parietal cortex (series 5, images 80 8-95). Redemonstrated focus of restricted diffusion with ADC correlate in the left insula (series 5, image 76 and series 6, image 26). Other previously noted foci of restricted diffusion are decreased in conspicuity and do not have persistent ADC correlates. No  acute hemorrhage, mass, mass effect, or midline shift. No hydrocephalus or extra-axial collection. Punctate focus of hemosiderin deposition in the left posterior frontal lobe. Minimal T2 hyperintense signal in the periventricular [place] matter and pons, likely the sequela of mild chronic small vessel ischemic disease. Vascular: Normal arterial flow voids. Skull and upper cervical spine: Normal marrow signal. Sinuses/Orbits: Minimal mucosal thickening in the ethmoid air cells. The orbits are unremarkable. Other: The mastoids are well aerated. MRA HEAD FINDINGS Anterior circulation: Both internal carotid arteries are patent to the termini, without significant stenosis. A1 segments patent. Normal anterior communicating artery. Anterior cerebral arteries are patent to their distal aspects. No M1 stenosis or occlusion. Distal MCA branches perfused and symmetric.In particular, the left MCA branches appear perfused and without focal stenosis. Posterior circulation: Vertebral arteries patent to the vertebrobasilar junction without stenosis. Basilar patent to its distal  aspect. Superior cerebellar arteries patent bilaterally. Patent P1 segments. PCAs perfused to their distal aspects without stenosis. Diminutive left posterior communicating artery. The right posterior communicating artery is not definitively visualized. Anatomic variants: None significant IMPRESSION: 1. 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 2. No intracranial large vessel occlusion or significant stenosis. In particular, left MCA branches appear perfused and without focal stenosis. These results will be called to the ordering clinician or representative by the Radiologist Assistant, and communication documented in the PACS or [employer]. Electronically Signed   By: Merilyn Baba M.D.   On: 01/28/2022 23:40   MR ANGIO HEAD WO CONTRAST  Result Date: 01/28/2022 CLINICAL DATA:  Right MCA stroke status post  thrombectomy, follow-up EXAM: MRI HEAD WITHOUT CONTRAST MRA HEAD WITHOUT CONTRAST TECHNIQUE: Multiplanar, multi-echo pulse sequences of the brain and surrounding structures were acquired without intravenous contrast. Angiographic images of the Circle of Willis were acquired using MRA technique without intravenous contrast. COMPARISON:  01/22/2022 MRI head, no prior MRA, correlation is made with CTA head and neck 01/21/2022 FINDINGS: Brain: New foci of restricted diffusion with ADC correlate in the left posterior frontal and anterior parietal cortex (series 5, images 80 8-95). Redemonstrated focus of restricted diffusion with ADC correlate in the left insula (series 5, image 76 and series 6, image 26). Other previously noted foci of restricted diffusion are decreased in conspicuity and do not have persistent ADC correlates. No acute hemorrhage, mass, mass effect, or midline shift. No hydrocephalus or extra-axial collection. Punctate focus of hemosiderin deposition in the left posterior frontal lobe. Minimal T2 hyperintense signal in the periventricular [place] matter and pons, likely the sequela of mild chronic small vessel ischemic disease. Vascular: Normal arterial flow voids. Skull and upper cervical spine: Normal marrow signal. Sinuses/Orbits: Minimal mucosal thickening in the ethmoid air cells. The orbits are unremarkable. Other: The mastoids are well aerated. MRA HEAD FINDINGS Anterior circulation: Both internal carotid arteries are patent to the termini, without significant stenosis. A1 segments patent. Normal anterior communicating artery. Anterior cerebral arteries are patent to their distal aspects. No M1 stenosis or occlusion. Distal MCA branches perfused and symmetric.In particular, the left MCA branches appear perfused and without focal stenosis. Posterior circulation: Vertebral arteries patent to the vertebrobasilar junction without stenosis. Basilar patent to its distal aspect. Superior cerebellar  arteries patent bilaterally. Patent P1 segments. PCAs perfused to their distal aspects without stenosis. Diminutive left posterior communicating artery. The right posterior communicating artery is not definitively visualized. Anatomic variants: None significant IMPRESSION: 1. 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 2. No intracranial large vessel occlusion or significant stenosis. In particular, left MCA branches appear perfused and without focal stenosis. These results will be called to the ordering clinician or representative by the Radiologist Assistant, and communication documented in the PACS or [employer]. Electronically Signed   By: Merilyn Baba M.D.   On: 01/28/2022 23:40   [place] EKG SITE RITE  Result Date: 01/28/2022 If Site Rite image not attached, placement could not be confirmed due to current cardiac rhythm.  DG Abd 1 View  Result Date: 01/28/2022 CLINICAL DATA:  Evaluate OG tube placement EXAM: ABDOMEN - 1 VIEW COMPARISON:  None Available. FINDINGS: The OG tube terminates in the left upper quadrant, in the region of the stomach. IMPRESSION: The OG tube appears to terminate in the stomach. No other abnormalities. Electronically Signed   By: Dorise Bullion III M.D.   On: 01/28/2022  17:28   Portable Chest x-ray  Result Date: 01/28/2022 CLINICAL DATA:  [id number]. Encounter for ETT placement. EXAM: PORTABLE CHEST 1 VIEW COMPARISON:  Chest x-ray 01/26/2022, CT heart 01/23/2022 FINDINGS: Enteric tube with tip terminating 3 cm above the carina. Slightly more prominent aortic arch and aortic knob likely due to positioning and AP portable technique. Otherwise the heart and mediastinal contours are unchanged normal limits. No focal consolidation. No pulmonary edema. No pleural effusion. No pneumothorax. No acute osseous abnormality. IMPRESSION: 1. Slightly more prominent aortic arch and aortic knob likely due to positioning and AP portable technique. Finding can  likely be further evaluated on CT angio head and neck 01/28/2022. 2. No active disease. Electronically Signed   By: Iven Finn M.D.   On: 01/28/2022 17:21   CT HEAD CODE STROKE WO CONTRAST  Result Date: 01/28/2022 CLINICAL DATA:  Code stroke.  Neuro deficit, acute, stroke suspected EXAM: CT HEAD WITHOUT CONTRAST TECHNIQUE: Contiguous axial images were obtained from the base of the skull through the vertex without intravenous contrast. RADIATION DOSE REDUCTION: This exam was performed according to the departmental dose-optimization program which includes automated exposure control, adjustment of the mA and/or kV according to patient size and/or use of iterative reconstruction technique. COMPARISON:  CT head 01/21/2022. FINDINGS: Brain: No evidence of acute large vascular territory infarction, hemorrhage, hydrocephalus, extra-axial collection or mass lesion/mass effect. Vascular: No hyperdense vessel identified. Skull: No acute fracture. Sinuses/Orbits: Clear sinuses.  No acute orbital findings. Other: No mastoid effusions. ASPECTS [facility] Stroke Program Early CT Score) total score (0-10 with 10 being normal): 10. IMPRESSION: 1. No evidence of acute intracranial abnormality. 2. ASPECTS is 10. Code stroke imaging results were communicated on 01/28/2022 at 1:43 pm to provider Bhagat via secure text paging. Electronically Signed   By: Margaretha Sheffield M.D.   On: 01/28/2022 13:43   DG Chest 2 View  Result Date: 01/26/2022 CLINICAL DATA:  Chest and shoulder pain.  Recent MRI and CVA. EXAM: CHEST - 2 VIEW COMPARISON:  11/21/2021 FINDINGS: The lungs are clear without focal pneumonia, edema, pneumothorax or pleural effusion. The cardiopericardial silhouette is within normal limits for size. The visualized bony structures of the thorax are unremarkable. IMPRESSION: No active cardiopulmonary disease. Electronically Signed   By: Misty Stanley M.D.   On: 01/26/2022 12:03   CT CORONARY MORPH W/CTA COR W/SCORE W/CA  W/CM &/OR WO/CM  Addendum Date: 01/23/2022   ADDENDUM REPORT: 01/23/2022 13:25 EXAM: OVER-READ INTERPRETATION  CT CHEST The following report is a limited chest CT over-read performed by radiologist Dr. Lindaann Slough [facility] on 01/23/2022. This over-read does not include interpretation of cardiac or coronary anatomy or pathology. The coronary CTA interpretation by the cardiologist is attached. COMPARISON:  Radiographs 11/21/2021. Coronary artery calcium score CT 10/04/2020 FINDINGS: Mediastinum/Nodes: No enlarged lymph nodes within the visualized mediastinum. Lungs/Pleura: There is no pleural effusion. The visualized lungs appear clear. Upper abdomen: No significant findings within the visualized upper abdomen. Musculoskeletal/Chest wall: No chest wall mass or suspicious osseous findings within the visualized chest. IMPRESSION: No significant extracardiac findings within the visualized chest. Electronically Signed   By: Richardean Sale M.D.   On: 01/23/2022 13:25   Result Date: 01/23/2022 HISTORY: Chest pain/anginal equiv, ECGs or troponins abnormal EXAM: Cardiac/Coronary CT TECHNIQUE: The patient was scanned on a [employer]. PROTOCOL: A 100 kV prospective scan was triggered in the descending thoracic aorta at 111 HU's. Axial non-contrast 3 mm slices were carried out through the heart. The data  set was analyzed on a dedicated work station and scored using the Agatston method. Gantry rotation speed was 250 msecs and collimation was .6 mm. Heart rate was optimized medically and sl NTG was given. The 3D data set was reconstructed in 5% intervals of the 35-75 % of the R-R cycle. Systolic and diastolic phases were analyzed on a dedicated work station using MPR, MIP and VRT modes. The patient received contrast. FINDINGS: Coronary calcium score: The patient's coronary artery calcium score is 0, which places the patient in the 0 percentile. Coronary arteries: Normal coronary origins.  Right  dominance. Right Coronary Artery: Normal caliber vessel, gives rise to small PDA. No significant plaque or stenosis. Left Main Coronary Artery: Normal caliber vessel. No significant plaque or stenosis. Small caliber ramus without significant plaque or stenosis. Left Anterior Descending Coronary Artery: Normal caliber vessel. Minimal noncalcified plaque with 1-24% stenosis. Gives rise to large first diagonal branch without significant stenosis. Distal LAD wraps apex Left Circumflex Artery: Normal caliber vessel. Very trivial noncalcified plaque without significant stenosis. Gives rise to large first and second, small third OM branches. Aorta: Normal size, 31 mm at the mid ascending aorta (level of the PA bifurcation) measured double oblique. Trivial aortic atherosclerosis. No dissection seen in visualized portions of the aorta. Aortic Valve: No calcifications. Trileaflet. Other findings: Normal pulmonary vein drainage into the left atrium. Normal left atrial appendage without a thrombus. Appendage is chicken wing type. Normal size of the pulmonary artery. Normal appearance of the pericardium. There is an apical LV thrombus, dimensions saves on PACS images. Maximum dimension 15.6 x 11.3 mm There is a small accessory left atrial segment on the cranial aspect of the left atrium. IMPRESSION: 1.  Very minimal nonobstructive CAD, CADRADS = 1. 2. Coronary calcium score of 0. This was 0 percentile for age and sex matched control. 3. Normal coronary origin with right dominance. 4. There is an apical LV thrombus, dimensions saves on PACS images. Maximum dimension 15.6 x 11.3 mm Findings reported to ordering provider. INTERPRETATION: CAD-RADS 1: Minimal non-obstructive CAD (0-24%). Consider non-atherosclerotic causes of chest pain. Consider preventive therapy and risk factor modification. Electronically Signed: By: Buford Dresser M.D. 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 She initially presented on 01/21/2022 with transient episode of aphasia followed sudden onset dizziness, nausea, vomiting, chest pain that radiated to upper back on Saturday. MRI brain showed small ischemic infarct of the frontal [place] matter on the left. Her troponins were elevated to over 300 and her echocardiogram showed apical hypokinesis and a left ventricular thrombus consistent with NSTEMI. Around 6 p.m. on 01/21/2022, patient developed worsening aphasia, NIHSS 6. Head CT was unremarkable. CT angiogram of the head and neck showed a left M2/M3-MCA occlusion. She was then transferred to our service for an emergency mechanical thrombectomy. EXAM: ULTRASOUND-GUIDED VASCULAR ACCESS DIAGNOSTIC CEREBRAL ANGIOGRAM MECHANICAL THROMBECTOMY FLAT PANEL HEAD CT COMPARISON:  CT/CT angiogram of the head and neck January 22, 2019 MEDICATIONS: No antibiotic was administered. ANESTHESIA/SEDATION: The procedure was performed under general anesthesia. CONTRAST:  75 mL of Omnipaque 300 milligram/mL. FLUOROSCOPY: Radiation Exposure Index (as provided by the fluoroscopic device): 370.4 mGy Kerma COMPLICATIONS: None immediate. TECHNIQUE: Informed written consent was obtained from the patient after a thorough discussion  of the procedural risks, benefits and alternatives. All questions were addressed. Maximal Sterile Barrier Technique was utilized including caps, mask, sterile gowns, sterile gloves, sterile drape, hand hygiene and skin antiseptic. A timeout was performed prior to the initiation of the procedure. The right groin was prepped and draped in the usual sterile fashion. Using a micropuncture kit and  the modified Seldinger technique, access was gained to the right common femoral artery and an 8 French sheath was placed. Real-time ultrasound guidance was utilized for vascular access including the acquisition of a permanent ultrasound image documenting patency of the accessed vessel. Under fluoroscopy, a Zoom 88 guide catheter was navigated over a 6 [place] VTK catheter and a 0.035" Terumo Glidewire into the aortic arch. The catheter was placed into the left common carotid artery and then advanced into the left internal carotid artery. The diagnostic catheter was removed. Frontal and lateral angiograms of the head were obtained. FINDINGS: 1. Normal caliber of the right common femoral artery, adequate for vascular access. 2. There is no occlusion of the distal left M2/MCA superior division branch extending to the M3 segment. PROCEDURE: Using biplane roadmap, a Vect 46 aspiration catheter was navigated over an Aristotle 24 microguidewire into the cavernous segment of the left ICA. The aspiration catheter was then advanced over the wire to the level of occlusion in the M2-M3 segment and connected to an aspiration pump. Continuous aspiration was performed for 2 minutes. The guide catheter was connected to a VacLok syringe. The aspiration catheter was subsequently removed under constant aspiration. The guide catheter was aspirated for debris. Left internal carotid artery angiograms with magnified frontal and lateral views of the head showed recanalization of the left MCA with slow flow in a few distal cortical branches (TICI 2C). No embolus to new territory. Flat panel CT of the head was obtained and post processed in a separate workstation with concurrent attending physician supervision. Selected images were sent to PACS. No evidence of hemorrhagic complication. Delayed left internal carotid artery angiogram showed persistent patency of the left MCA. Right common femoral artery angiogram was obtained in right anterior  oblique view. The puncture is at the level of the common femoral artery. The artery has normal caliber, adequate for closure device. The sheath was exchanged over the wire for a Perclose prostyle which was utilized for access closure. Immediate hemostasis was achieved. IMPRESSION: 1. Successful mechanical thrombectomy for treatment of a distal left M2/MCA occlusion with direct contact aspiration achieving complete recanalization (TICI 2C). 2. No thromboembolic or hemorrhagic complication. PLAN: Transfer to ICU for continued post stroke care. Electronically Signed   By: Pedro Earls M.D.   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 She initially presented on 01/21/2022 with transient episode of aphasia followed sudden onset dizziness, nausea, vomiting, chest pain that radiated to upper back on Saturday. MRI brain showed small ischemic infarct of the frontal [place] matter on the left. Her troponins were elevated to over 300 and her echocardiogram showed apical hypokinesis and a left ventricular thrombus consistent with NSTEMI. Around 6 p.m. on 01/21/2022, patient developed worsening aphasia, NIHSS 6. Head CT was unremarkable. CT angiogram of the head and neck showed a left M2/M3-MCA occlusion. She was then transferred to our service for an emergency mechanical  thrombectomy. EXAM: ULTRASOUND-GUIDED VASCULAR ACCESS DIAGNOSTIC CEREBRAL ANGIOGRAM MECHANICAL THROMBECTOMY FLAT PANEL HEAD CT COMPARISON:  CT/CT angiogram of the head and neck January 22, 2019 MEDICATIONS: No antibiotic was administered. ANESTHESIA/SEDATION: The procedure was performed under general anesthesia. CONTRAST:  75 mL of  Omnipaque 300 milligram/mL. FLUOROSCOPY: Radiation Exposure Index (as provided by the fluoroscopic device): 196.2 mGy Kerma COMPLICATIONS: None immediate. TECHNIQUE: Informed written consent was obtained from the patient after a thorough discussion of the procedural risks, benefits and alternatives. All questions were addressed. Maximal Sterile Barrier Technique was utilized including caps, mask, sterile gowns, sterile gloves, sterile drape, hand hygiene and skin antiseptic. A timeout was performed prior to the initiation of the procedure. The right groin was prepped and draped in the usual sterile fashion. Using a micropuncture kit and the modified Seldinger technique, access was gained to the right common femoral artery and an 8 French sheath was placed. Real-time ultrasound guidance was utilized for vascular access including the acquisition of a permanent ultrasound image documenting patency of the accessed vessel. Under fluoroscopy, a Zoom 88 guide catheter was navigated over a 6 [place] VTK catheter and a 0.035" Terumo Glidewire into the aortic arch. The catheter was placed into the left common carotid artery and then advanced into the left internal carotid artery. The diagnostic catheter was removed. Frontal and lateral angiograms of the head were obtained. FINDINGS: 1. Normal caliber of the right common femoral artery, adequate for vascular access. 2. There is no occlusion of the distal left M2/MCA superior division branch extending to the M3 segment. PROCEDURE: Using biplane roadmap, a Vect 46 aspiration catheter was navigated over an Aristotle 24 microguidewire into the cavernous segment of the left ICA. The aspiration catheter was then advanced over the wire to the level of occlusion in the M2-M3 segment and connected to an aspiration pump. Continuous aspiration was performed for 2 minutes. The guide catheter was connected to a VacLok syringe. The aspiration catheter was subsequently removed under constant  aspiration. The guide catheter was aspirated for debris. Left internal carotid artery angiograms with magnified frontal and lateral views of the head showed recanalization of the left MCA with slow flow in a few distal cortical branches (TICI 2C). No embolus to new territory. Flat panel CT of the head was obtained and post processed in a separate workstation with concurrent attending physician supervision. Selected images were sent to PACS. No evidence of hemorrhagic complication. Delayed left internal carotid artery angiogram showed persistent patency of the left MCA. Right common femoral artery angiogram was obtained in right anterior oblique view. The puncture is at the level of the common femoral artery. The artery has normal caliber, adequate for closure device. The sheath was exchanged over the wire for a Perclose prostyle which was utilized for access closure. Immediate hemostasis was achieved. IMPRESSION: 1. Successful mechanical thrombectomy for treatment of a distal left M2/MCA occlusion with direct contact aspiration achieving complete recanalization (TICI 2C). 2. No thromboembolic or hemorrhagic complication. PLAN: Transfer to ICU for continued post stroke care. Electronically Signed   By: Pedro Earls M.D.   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 She initially presented on 01/21/2022 with transient episode of aphasia followed sudden onset dizziness, nausea, vomiting, chest pain that radiated  to upper back on Saturday. MRI brain showed small ischemic infarct of the frontal [place] matter on the left. Her troponins were elevated to over 300 and her  echocardiogram showed apical hypokinesis and a left ventricular thrombus consistent with NSTEMI. Around 6 p.m. on 01/21/2022, patient developed worsening aphasia, NIHSS 6. Head CT was unremarkable. CT angiogram of the head and neck showed a left M2/M3-MCA occlusion. She was then transferred to our service for an emergency mechanical thrombectomy. EXAM: ULTRASOUND-GUIDED VASCULAR ACCESS DIAGNOSTIC CEREBRAL ANGIOGRAM MECHANICAL THROMBECTOMY FLAT PANEL HEAD CT COMPARISON:  CT/CT angiogram of the head and neck January 22, 2019 MEDICATIONS: No antibiotic was administered. ANESTHESIA/SEDATION: The procedure was performed under general anesthesia. CONTRAST:  75 mL of Omnipaque 300 milligram/mL. FLUOROSCOPY: Radiation Exposure Index (as provided by the fluoroscopic device): 488.3 mGy Kerma COMPLICATIONS: None immediate. TECHNIQUE: Informed written consent was obtained from the patient after a thorough discussion of the procedural risks, benefits and alternatives. All questions were addressed. Maximal Sterile Barrier Technique was utilized including caps, mask, sterile gowns, sterile gloves, sterile drape, hand hygiene and skin antiseptic. A timeout was performed prior to the initiation of the procedure. The right groin was prepped and draped in the usual sterile fashion. Using a micropuncture kit and the modified Seldinger technique, access was gained to the right common femoral artery and an 8 French sheath was placed. Real-time ultrasound guidance was utilized for vascular access including the acquisition of a permanent ultrasound image documenting patency of the accessed vessel. Under fluoroscopy, a Zoom 88 guide catheter was navigated over a 6 [place] VTK catheter and a 0.035" Terumo Glidewire into the aortic arch. The catheter was placed into the left common carotid artery and then advanced into the left internal carotid artery. The diagnostic catheter was removed. Frontal and lateral angiograms of the head were  obtained. FINDINGS: 1. Normal caliber of the right common femoral artery, adequate for vascular access. 2. There is no occlusion of the distal left M2/MCA superior division branch extending to the M3 segment. PROCEDURE: Using biplane roadmap, a Vect 46 aspiration catheter was navigated over an Aristotle 24 microguidewire into the cavernous segment of the left ICA. The aspiration catheter was then advanced over the wire to the level of occlusion in the M2-M3 segment and connected to an aspiration pump. Continuous aspiration was performed for 2 minutes. The guide catheter was connected to a VacLok syringe. The aspiration catheter was subsequently removed under constant aspiration. The guide catheter was aspirated for debris. Left internal carotid artery angiograms with magnified frontal and lateral views of the head showed recanalization of the left MCA with slow flow in a few distal cortical branches (TICI 2C). No embolus to new territory. Flat panel CT of the head was obtained and post processed in a separate workstation with concurrent attending physician supervision. Selected images were sent to PACS. No evidence of hemorrhagic complication. Delayed left internal carotid artery angiogram showed persistent patency of the left MCA. Right common femoral artery angiogram was obtained in right anterior oblique view. The puncture is at the level of the common femoral artery. The artery has normal caliber, adequate for closure device. The sheath was exchanged over the wire for a Perclose prostyle which was utilized for access closure. Immediate hemostasis was achieved. IMPRESSION: 1. Successful mechanical thrombectomy for treatment of a distal left M2/MCA occlusion with direct contact aspiration achieving complete recanalization (TICI 2C). 2. No thromboembolic or hemorrhagic complication. PLAN: Transfer to ICU for continued post stroke care. Electronically Signed  By: Pedro Earls M.D.   On: 01/22/2022  14:25   MR BRAIN WO CONTRAST  Result Date: 01/22/2022 CLINICAL DATA:  Follow-up stroke EXAM: MRI HEAD WITHOUT CONTRAST TECHNIQUE: Multiplanar, multiecho pulse sequences of the brain and surrounding structures were obtained without intravenous contrast. COMPARISON:  Brain MRI and CT/CTA head and neck 1 day prior FINDINGS: Brain: A punctate fusion of diffusion restriction in the left frontal lobe periventricular [place] matter is unchanged compared to the study from 1 day prior. Additional small foci of diffusion restriction in the left external capsule, insula, and frontal lobe cortex are new. There is no associated hemorrhage or mass effect. Parenchymal volume is normal. The ventricles are normal in size. Parenchymal signal is otherwise normal, with no significant burden of under vitamin chronic small vessel ischemic change. There is no mass lesion.  There is no mass effect or midline shift. Vascular: Normal flow voids. Skull and upper cervical spine: Normal marrow signal. Sinuses/Orbits: The paranasal sinuses are clear. The globes and orbits are unremarkable. Other: None. IMPRESSION: New punctate acute infarcts in the left external capsule, insula, and frontal lobe cortex, and unchanged additional punctate infarct in the left frontal lobe [place] matter. Electronically Signed   By: Valetta Mole M.D.   On: 01/22/2022 11:39   CT CEREBRAL PERFUSION W CONTRAST  Result Date: 01/21/2022 CLINICAL DATA:  Neuro deficit, acute, stroke suspected. EXAM: CT ANGIOGRAPHY HEAD AND NECK CT PERFUSION BRAIN TECHNIQUE: Multidetector CT imaging of the head and neck was performed using the standard protocol during bolus administration of intravenous contrast. Multiplanar CT image reconstructions and MIPs were obtained to evaluate the vascular anatomy. Carotid stenosis measurements (when applicable) are obtained utilizing NASCET criteria, using the distal internal carotid diameter as the denominator. Multiphase CT imaging of the  brain was performed following IV bolus contrast injection. Subsequent parametric perfusion maps were calculated using RAPID software. RADIATION DOSE REDUCTION: This exam was performed according to the departmental dose-optimization program which includes automated exposure control, adjustment of the mA and/or kV according to patient size and/or use of iterative reconstruction technique. CONTRAST:  139mL OMNIPAQUE IOHEXOL 350 MG/ML SOLN COMPARISON:  Brain MRI 01/21/2022. Noncontrast head CT performed earlier today 01/21/2022. FINDINGS: CTA NECK FINDINGS Aortic arch: Standard aortic branching. Mild atherosclerotic plaque within the visualized aortic arch and proximal major branch vessels of the neck. Streak and beam hardening artifact arising from a dense right-sided contrast bolus partially obscures the right subclavian artery. Within this limitation, there is no appreciable hemodynamically significant innominate or proximal subclavian artery stenosis. Right carotid system: CCA and ICA patent within the neck without stenosis. Mild atherosclerotic plaque about the carotid bifurcation. Left carotid system: CCA and ICA patent within the neck without stenosis. Minimal atherosclerotic plaque about the carotid bifurcation. Vertebral arteries: Vertebral arteries codominant and patent within the neck without stenosis. Nonstenotic atherosclerotic plaque at the origin of the right vertebral artery. Skeleton: Cervical spondylosis. No acute fracture or aggressive osseous lesion. Other neck: No neck mass or cervical lymphadenopathy. Upper chest: No consolidation within the imaged lung apices. Review of the MIP images confirms the above findings CTA HEAD FINDINGS Anterior circulation: The intracranial internal carotid arteries are patent. The M1 middle cerebral arteries are patent. Occluded mid M2 left MCA vessel (with distal reconstitution, likely due to collateral flow) (for instance as seen on series 15, image 15). The anterior  cerebral arteries are patent. No intracranial aneurysm is identified. Posterior circulation: The intracranial vertebral arteries are patent. The basilar artery is  patent. The posterior cerebral arteries are patent. Posterior communicating arteries are diminutive or absent, bilaterally. Venous sinuses: Within the limitations of contrast timing, no convincing thrombus. Anatomic variants: As described. Review of the MIP images confirms the above findings CT Brain Perfusion Findings: CBF (<30%) Volume: 22mL Perfusion (Tmax>6.0s) volume: 14mL Mismatch Volume: 15mL Infarction Location:None identified CTA head impression and CT perfusion head impression called by telephone at the time of interpretation on 01/21/2022 at 6:50 pm to provider MCNEILL [facility] , who verbally acknowledged these results. IMPRESSION: CTA neck: 1. The common carotid, internal carotid and vertebral arteries are patent within the neck without stenosis. Minimal non-stenotic atherosclerotic plaque about the carotid bifurcations and at the origin of the right vertebral artery. 2.  Aortic Atherosclerosis (ICD10-I70.0). CTA head: Occluded mid M2 left MCA vessel (with distal reconstitution, likely due to collateral flow). CT perfusion head: The perfusion software identifies a 7 mL region of critically hypoperfused parenchyma within the left insula/frontal lobe (utilizing the Tmax>6 seconds threshold). No core infarct is identified. Reported mismatch volume: 7 mL. Electronically Signed   By: Kellie Simmering D.O.   On: 01/21/2022 19:09   CT ANGIO HEAD NECK W WO CM (CODE STROKE)  Result Date: 01/21/2022 CLINICAL DATA:  Neuro deficit, acute, stroke suspected. EXAM: CT ANGIOGRAPHY HEAD AND NECK CT PERFUSION BRAIN TECHNIQUE: Multidetector CT imaging of the head and neck was performed using the standard protocol during bolus administration of intravenous contrast. Multiplanar CT image reconstructions and MIPs were obtained to evaluate the vascular anatomy.  Carotid stenosis measurements (when applicable) are obtained utilizing NASCET criteria, using the distal internal carotid diameter as the denominator. Multiphase CT imaging of the brain was performed following IV bolus contrast injection. Subsequent parametric perfusion maps were calculated using RAPID software. RADIATION DOSE REDUCTION: This exam was performed according to the departmental dose-optimization program which includes automated exposure control, adjustment of the mA and/or kV according to patient size and/or use of iterative reconstruction technique. CONTRAST:  152mL OMNIPAQUE IOHEXOL 350 MG/ML SOLN COMPARISON:  Brain MRI 01/21/2022. Noncontrast head CT performed earlier today 01/21/2022. FINDINGS: CTA NECK FINDINGS Aortic arch: Standard aortic branching. Mild atherosclerotic plaque within the visualized aortic arch and proximal major branch vessels of the neck. Streak and beam hardening artifact arising from a dense right-sided contrast bolus partially obscures the right subclavian artery. Within this limitation, there is no appreciable hemodynamically significant innominate or proximal subclavian artery stenosis. Right carotid system: CCA and ICA patent within the neck without stenosis. Mild atherosclerotic plaque about the carotid bifurcation. Left carotid system: CCA and ICA patent within the neck without stenosis. Minimal atherosclerotic plaque about the carotid bifurcation. Vertebral arteries: Vertebral arteries codominant and patent within the neck without stenosis. Nonstenotic atherosclerotic plaque at the origin of the right vertebral artery. Skeleton: Cervical spondylosis. No acute fracture or aggressive osseous lesion. Other neck: No neck mass or cervical lymphadenopathy. Upper chest: No consolidation within the imaged lung apices. Review of the MIP images confirms the above findings CTA HEAD FINDINGS Anterior circulation: The intracranial internal carotid arteries are patent. The M1 middle  cerebral arteries are patent. Occluded mid M2 left MCA vessel (with distal reconstitution, likely due to collateral flow) (for instance as seen on series 15, image 15). The anterior cerebral arteries are patent. No intracranial aneurysm is identified. Posterior circulation: The intracranial vertebral arteries are patent. The basilar artery is patent. The posterior cerebral arteries are patent. Posterior communicating arteries are diminutive or absent, bilaterally. Venous sinuses: Within the limitations of contrast timing,  no convincing thrombus. Anatomic variants: As described. Review of the MIP images confirms the above findings CT Brain Perfusion Findings: CBF (<30%) Volume: 73mL Perfusion (Tmax>6.0s) volume: 30mL Mismatch Volume: 24mL Infarction Location:None identified CTA head impression and CT perfusion head impression called by telephone at the time of interpretation on 01/21/2022 at 6:50 pm to provider MCNEILL [facility] , who verbally acknowledged these results. IMPRESSION: CTA neck: 1. The common carotid, internal carotid and vertebral arteries are patent within the neck without stenosis. Minimal non-stenotic atherosclerotic plaque about the carotid bifurcations and at the origin of the right vertebral artery. 2.  Aortic Atherosclerosis (ICD10-I70.0). CTA head: Occluded mid M2 left MCA vessel (with distal reconstitution, likely due to collateral flow). CT perfusion head: The perfusion software identifies a 7 mL region of critically hypoperfused parenchyma within the left insula/frontal lobe (utilizing the Tmax>6 seconds threshold). No core infarct is identified. Reported mismatch volume: 7 mL. Electronically Signed   By: Kellie Simmering D.O.   On: 01/21/2022 19:09   CT HEAD CODE STROKE WO CONTRAST  Result Date: 01/21/2022 CLINICAL DATA:  Code stroke. Neuro deficit, acute, stroke suspected. EXAM: CT HEAD WITHOUT CONTRAST TECHNIQUE: Contiguous axial images were obtained from the base of the skull through the  vertex without intravenous contrast. RADIATION DOSE REDUCTION: This exam was performed according to the departmental dose-optimization program which includes automated exposure control, adjustment of the mA and/or kV according to patient size and/or use of iterative reconstruction technique. COMPARISON:  Same-day brain MRI 01/21/2022. FINDINGS: Brain: No age advanced or lobar predominant parenchymal atrophy. A subcentimeter acute infarct within the left frontal lobe [place] matter (adjacent to the left lateral ventricle frontal horn) is occult by CT and was better appreciated on the brain MRI performed earlier today. No CT evidence of interval acute infarct. Minimal chronic small-vessel image changes within the cerebral [place] matter and pons, better appreciated on the prior brain MRI. There is no acute intracranial hemorrhage. No extra-axial fluid collection. No evidence of an intracranial mass. No midline shift. Vascular: No hyperdense vessel. Skull: No fracture or aggressive osseous lesion. Sinuses/Orbits: No mass or acute finding within the imaged orbits. Trace mucosal thickening within the anterior left ethmoid air cells. ASPECTS ([place] Stroke Program Early CT Score) - Ganglionic level infarction (caudate, lentiform nuclei, internal capsule, insula, M1-M3 cortex): 7 - Supraganglionic infarction (M4-M6 cortex): 3 Total score (0-10 with 10 being normal): 10 These results were communicated to Dr. Leonel Ramsay at 6:27 pmon 8/29/2023by text page via the [facility] messaging system. IMPRESSION: A known subcentimeter acute infarct within the left frontal lobe [place] matter is occult by CT, and was better appreciated on the brain MRI performed earlier today. No CT evidence of interval acute intracranial abnormality. Minimal chronic small-vessel ischemic changes within the cerebral [place] matter and pons. Electronically Signed   By: Kellie Simmering D.O.   On: 01/21/2022 18:27   ECHOCARDIOGRAM COMPLETE  Result Date: 01/21/2022     ECHOCARDIOGRAM REPORT   Patient Name:   CHAVELA JUSTINIANO Crumble Date of Exam: 01/21/2022 Medical Rec #:  [id number]    Height:       61.0 in Accession #:    [id number]   Weight:       171.0 lb Date of Birth:  08/16/1955    BSA:          1.767 m Patient Age:    89 years     BP:           146/125 mmHg Patient Gender: F  HR:           72 bpm. Exam Location:  Inpatient Procedure: 2D Echo, Color Doppler and Cardiac Doppler Indications:    Stroke i63.9  History:        Patient has no prior history of Echocardiogram examinations.                 Risk Factors:Hypertension.  Sonographer:    Raquel Sarna Senior RDCS Referring Phys: [place]  1. Left ventricular ejection fraction, by estimation, is 45 to 50%. The left ventricle has mildly decreased function. The left ventricle demonstrates regional wall motion abnormalities (apical hypokinesis with out true aneurysm and with presence of an LV thrombus 1.84 X 0.9 X 1.2 cm).  2. The mitral valve is grossly normal. No evidence of mitral valve regurgitation. No evidence of mitral stenosis.  3. The aortic valve is tricuspid. Aortic valve regurgitation is not visualized. No aortic stenosis is present.  4. Right ventricular systolic function is normal. The right ventricular size is normal. Tricuspid regurgitation signal is inadequate for assessing PA pressure.  5. The inferior vena cava is normal in size with greater than 50% respiratory variability, suggesting right atrial pressure of 3 mmHg. Comparison(s): No prior Echocardiogram. 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 Cardiology aware. FINDINGS  Left Ventricle: Left ventricular ejection fraction, by estimation, is 45 to 50%. The left ventricle has mildly decreased function. The left ventricle demonstrates regional wall motion abnormalities. The left ventricular internal cavity size was small. There is no left ventricular hypertrophy. Left ventricular diastolic parameters are  consistent with Grade I diastolic dysfunction (impaired relaxation).  LV Wall Scoring: The apical lateral segment, apical septal segment, apical anterior segment, and apical inferior segment are hypokinetic. Right Ventricle: The right ventricular size is normal. No increase in right ventricular wall thickness. Right ventricular systolic function is normal. Tricuspid regurgitation signal is inadequate for assessing PA pressure. Left Atrium: Left atrial size was normal in size. Right Atrium: Right atrial size was normal in size. Pericardium: There is no evidence of pericardial effusion. Mitral Valve: The mitral valve is grossly normal. No evidence of mitral valve regurgitation. No evidence of mitral valve stenosis. Tricuspid Valve: The tricuspid valve is normal in structure. Tricuspid valve regurgitation is not demonstrated. No evidence of tricuspid stenosis. Aortic Valve: The aortic valve is tricuspid. Aortic valve regurgitation is not visualized. No aortic stenosis is present. Pulmonic Valve: The pulmonic valve was normal in structure. Pulmonic valve regurgitation is not visualized. No evidence of pulmonic stenosis. Aorta: The aortic root and ascending aorta are structurally normal, with no evidence of dilitation. Venous: The inferior vena cava is normal in size with greater than 50% respiratory variability, suggesting right atrial pressure of 3 mmHg. IAS/Shunts: No atrial level shunt detected by color flow Doppler.  LEFT VENTRICLE PLAX 2D LVIDd:         3.60 cm     Diastology LVIDs:         2.50 cm     LV e' medial:    6.68 cm/s LV PW:         0.80 cm     LV E/e' medial:  6.5 LV IVS:        0.80 cm     LV e' lateral:   11.00 cm/s LVOT diam:     1.70 cm     LV E/e' lateral: 3.9 LV SV:         42 LV SV Index:  24 LVOT Area:     2.27 cm  LV Volumes (MOD) LV vol d, MOD A2C: 70.1 ml LV vol d, MOD A4C: 46.6 ml LV vol s, MOD A2C: 37.3 ml LV vol s, MOD A4C: 23.3 ml LV SV MOD A2C:     32.8 ml LV SV MOD A4C:     46.6 ml LV  SV MOD BP:      28.7 ml RIGHT VENTRICLE RV S prime:     9.32 cm/s TAPSE (M-mode): 1.7 cm LEFT ATRIUM             Index        RIGHT ATRIUM          Index LA diam:        2.70 cm 1.53 cm/m   RA Area:     9.52 cm LA Vol (A2C):   28.1 ml 15.90 ml/m  RA Volume:   20.50 ml 11.60 ml/m LA Vol (A4C):   26.3 ml 14.88 ml/m LA Biplane Vol: 28.8 ml 16.30 ml/m  AORTIC VALVE LVOT Vmax:   115.00 cm/s LVOT Vmean:  68.700 cm/s LVOT VTI:    0.185 m  AORTA Ao Root diam: 2.90 cm Ao Asc diam:  3.20 cm MITRAL VALVE MV Area (PHT): 3.11 cm    SHUNTS MV Decel Time: 244 msec    Systemic VTI:  0.18 m MV E velocity: 43.20 cm/s  Systemic Diam: 1.70 cm MV A velocity: 64.50 cm/s MV E/A ratio:  0.67 Rudean Haskell MD Electronically signed by Rudean Haskell MD Signature Date/Time: 01/21/2022/4:04:02 PM    Final    MR BRAIN WO CONTRAST  Result Date: 01/21/2022 CLINICAL DATA:  TIA.  Sudden onset of aphasia, now resolved. EXAM: MRI HEAD WITHOUT CONTRAST TECHNIQUE: Multiplanar, multiecho pulse sequences of the brain and surrounding structures were obtained without intravenous contrast. COMPARISON:  None Available. FINDINGS: Brain: Small acute infarct in the left frontal [place] matter. No hemorrhage, hydrocephalus, mass, or atrophy. Minimal chronic small vessel changes may be present in the pons. No prior infarct is seen. Vascular: Major flow voids are preserved Skull and upper cervical spine: Normal marrow signal. Degenerative facet spurring where covered in the cervical spine with mild C2-3 anterolisthesis. Sinuses/Orbits: Negative IMPRESSION: Small acute infarct in the left frontal [place] matter. Electronically Signed   By: Jorje Guild M.D.   On: 01/21/2022 12:32     PHYSICAL EXAM  Temp:  [97.4 F (36.3 C)-98.7 F (37.1 C)] 97.4 F (36.3 C) (09/12 0915) Pulse Rate:  [70-86] 86 (09/12 0915) Resp:  [14-17] 17 (09/12 0915) BP: (92-127)/(65-87) 107/71 (09/12 0915) SpO2:  [98 %-100 %] 100 % (09/12 0915)  General -  Well nourished, well developed, in no apparent distress. Sitting up in bed.  Skin: left groin hematoma stable.  Neuro - awake, alert, eyes open, expressive aphasia, able to make sounds out but not meaningful words, following simple command..  Not able to name and repeat. No gaze palsy, tracking bilaterally, blinking to visual threat bilaterally.  Right mild facial droop. Tongue protrusion difficulty. LUE 5/5, no drift, RUE 4-/5 proximal and 0/5 distally. Bilaterally LEs 5/5, no drift. Sensation symmetrical bilaterally subjectively, left FTN intact, gait not tested.    ASSESSMENT/PLAN Ms. DERRICA SIEG is a 66 y.o. 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 No tPA given due  to on Eliquis and recent stroke.    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 INR goal 2.5 Continue ASA 81 per cardiology. Ongoing aggressive stroke risk factor management Therapy recommendations:  CIR Disposition:  pending  Hx of stroke and LV thrombus 8/29 admitted for nausea vomiting dizziness chest pain and then developed right facial droop and aphasia.  Found to have troponin > 300, EF 45 to 50%  with LV thrombus.  CTA coronary artery showed minimal CAD.  CT no acute abnormality.  CTA head and neck showed left M2 occlusion.  CTP 0/7.  Status post EVT with TICI2c.  MRI showed left frontal [place] matter punctate infarct.  Repeat MRI showed left external capsule, insular cortex and frontal cortex punctate infarcts.  LDL 150, A1c 5.0.  UDS positive for THC.  Patient was put on heparin IV and aspirin and eventually discharged on Eliquis and aspirin as well as Crestor 20. Per family, patient has slight expressive aphasia since discharge, no physical deficit. Cardiology Dr. Johnsie Cancel on board, recommend coumadin with INR goal 2.5. on lovenox bridge.   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  Lupus - ANA and ds DNA negative Low K resolved. UTI:per hospitalist. Started on rocephin 2/3days.  Hospital day # 7    02/04/2022 11:23 AM

## 2022-02-04 NOTE — Progress Notes (Signed)
PROGRESS NOTE    Chloe Johnson  GEX:[id number] DOB: December 03, 1955 DOA: 01/28/2022 PCP: Thana Ates, MD    Brief Narrative:  Chloe Johnson is a 66 y.o. female with past medical history of hypertension, hyperlipidemia, cardiomyopathy, on anticoagulation with Eliquis L3/L4 osteomyelitis, lumbar stenosis with foot drop, and chronic back pain who presented to hospital with right arm weakness and aphasia.  Patient was recently paralyzed for myocardial infarction complicated by LV thrombus and right right MCA stroke s/p thrombectomy 01/21/2022. MRI revealed new infarcts in the left posterior frontal and anterior parietal cortex.  Patient was not TPA candidate due to the recent stroke and being on Eliquis.  Patient was noted to have abnormal urinalysis so medical team was consulted.   Assessment and plan.  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  MRI showed infarcts in the left posterior and frontal anterior parietal cortex thought to be embolic secondary to LV thrombus for which patient had been on Eliquis.  Eliquis and was switched to Coumadin with Lovenox bridge with INR goal of 2.5.  Further management as per neurology.  Latest INR of 3.0.   Urinary tract infection likely acute cystitis. Abnormal urinalysis which positive for trace leukocytes, positive nitrites, rare bacteria, and 21-50 WBCs.  Patient has several allergies including penicillin and prior reaction to Keflex of rash.  Follow urine culture which is pending..  Currently on Rocephin IV.  Had mild nausea with Rocephin but would not consider this as elevated.  We will continue.  Plan is 3-day course of Rocephin.  Alternatives antibiotics if unable to tolerate include ciprofloxacin  or Levaquin.  Benadryl IV if needed for itching or allergy.  No leukocytosis or fever.   History of stroke and LV thrombus Currently on Coumadin with Lovenox bridge to goal INR of 2-3.   Essential hypertension -Continue Entresto   Hyperlipidemia Continue Crestor   Acute respiratory failure has resolved.     DVT prophylaxis: Place and maintain sequential compression device Start: 01/28/22 1656 SCD's Start: 01/28/22 1428   Code Status:     Code Status: Full Code  Disposition: As per primary team.  Patient is stable from medical point of view.   Family Communication: Communicated with the patient's husband at bedside.  Antimicrobials:  Rocephin IV 02/03/22>   Subjective: Today, patient was seen and examined at bedside.  Patient has expressive aphasia.  Denies any pain, nausea, vomiting, fever, chills or rigor.  Patient's husband at bedside.  Denies urinary symptoms.  Objective: Vitals:   02/03/22 1500 02/03/22 1939 02/04/22 0443 02/04/22 0915  BP: 117/87 127/76 92/65 107/71  Pulse: 84 80 70 86  Resp: 16 16 14 17   Temp: 98.7 F (37.1 C) 98.4 F (36.9 C) 97.8 F (36.6 C) (!) 97.4 F (36.3 C)  TempSrc: Oral Oral Oral Oral  SpO2: 98% 100% 99% 100%  Weight:      Height:        Intake/Output Summary (Last 24 hours) at 02/04/2022 1035 Last data filed at 02/03/2022 1606 Gross per 24 hour  Intake 100 ml  Output --  Net 100 ml   Filed Weights   01/28/22 1332  Weight: 59 kg    Physical Examination: Body mass index is  24.58 kg/m.  General:  Average built, not in obvious distress, aphasic, HENT:   No scleral pallor or icterus noted. Oral mucosa is moist.  Chest:  Clear breath sounds.  Diminished breath sounds bilaterally. No crackles or wheezes.  CVS: S1 &S2 heard. No murmur.  Regular rate and rhythm. Abdomen: Soft, nontender, nondistended.  Bowel sounds are heard.   Extremities: No cyanosis, clubbing or edema.  Peripheral pulses are palpable. Psych: Alert, awake  and aphasic, CNS: Expressive aphasia,.  Complains and okay.  Right upper extremity mild weakness,  Skin: Warm and dry.  No rashes noted.  Data Reviewed:   CBC: Recent Labs  Lab 01/28/22 1330 01/28/22 1337 01/29/22 0300 01/30/22 0143 01/31/22 0415 02/01/22 0700 02/02/22 0445 02/03/22 0530  WBC 4.7  --  8.6 5.3 5.2 4.8 4.5 4.4  NEUTROABS 2.5  --  6.1  --   --   --   --   --   HGB 12.0   < > 11.2* 9.2* 9.3* 9.4* 9.5* 9.7*  HCT 35.5*   < > 33.3* 27.3* 28.0* 28.1* 27.9* 28.6*  MCV 88.5  --  87.6 86.4 87.0 86.7 86.1 86.4  PLT 171  --  173 143* 157 178 183 184   < > = values in this interval not displayed.    Basic Metabolic Panel: Recent Labs  Lab 01/30/22 1620 01/31/22 0415 02/01/22 0700 02/02/22 0445 02/03/22 0530  NA 138 139 141 142 140  K 3.5 3.1* 3.4* 3.4* 3.5  CL 109 109 110 109 109  CO2 22 24 25 25 25   GLUCOSE 98 92 109* 112* 108*  BUN <5* <5* <5* 6* 5*  CREATININE 0.56 0.61 0.66 0.57 0.59  CALCIUM 8.6* 8.6* 8.6* 8.8* 8.9  MG 1.9  --   --   --  2.1  PHOS 1.8*  --   --   --  4.1    Liver Function Tests: Recent Labs  Lab 01/28/22 1330  AST 22  ALT 18  ALKPHOS 45  BILITOT 0.3  PROT 6.4*  ALBUMIN 3.9     Radiology Studies: No results found.    LOS: 7 days    03/30/22, MD [facility] Available via Epic secure chat 7am-7pm After these hours, please refer to coverage provider listed on [email or web address] 02/04/2022, 10:35 AM

## 2022-02-04 NOTE — Progress Notes (Signed)
Pre-Admission Assessment   Patient: Chloe Johnson is an 66 y.o., female MRN: [id number] DOB: [birth year] Height: $RemoveBeforeDE'5\' 1"'[id number]$  (154.9 cm) Weight: 59 kg   Insurance Information HMO:     PPO: yes     PCP:      IPA:      80/20:      OTHER:  PRIMARY: BCBS/Federal Emp      Policy#: [id number]      Subscriber: [place] Name: Edrick Oh       Phone#: [phone]     Fax#: [phone] Beverlee Nims with BCBS called with Josem Kaufmann on 9/12 with approval 9/12-9/20 with updates due 1/21 Pre-Cert#: [id number]      Employer:  Benefits:  Phone #: [phone]     Name:  Eff. Date: 06/01/17-still active     Deduct: $350 ($350 met)      Out of Pocket Max: $6,000 ($3,278 met)      Life Max: NA CIR: $450 copay/admission      SNF: $275 copay/admission; limited to 30 days/cal yr Outpatient: 65% coverage , limited to 75 visits/cal yr    Co-Pay: 35% co-insurance Home Health: 65% coverage, limited to 50 visits/cal yr      Co-Pay: 35% co-insurance DME: 65% coverage     Co-Pay: 35% co-insurance Providers: in-network SECONDARY:    Medicare Part A    Policy#: [id number]     Financial Counselor:       Phone#:    The "Data Collection Information Summary" for patients in Inpatient Rehabilitation Facilities with attached "Privacy Act [place] Records" was provided and verbally reviewed with: N/A   Emergency Contact Information Contact Information       Name Relation Home Work Mobile    [place] [phone]   [phone]    Tosco,Vincent Son [phone]   [phone]    [place]co,Emily Daughter [phone]   [phone]    [place]ills Koller Daughter [phone]               Current Medical History  Patient Admitting Diagnosis: CVA History of Present Illness: Chloe Johnson is a 66 year old right-handed female with history significant for 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  Per chart review patient lives with spouse.  Multilevel home.  Husband works during the day.  Patient reported independent prior to admission without assistive device.  Presented 01/28/2022 with acute onset of right-sided weakness and aphasia.  Patient just recently returned home from traveling to [place] 5 days ago.  CT/MRI showed new acute infarct in left posterior frontal and anterior parietal cortex.  MRA showed no intracranial large vessel occlusion or significant stenosis.  Underwent bilateral common carotid arteriogram as well as right vertebral artery angiogram showing occluded superior division of left MCA artery in the M2 region and underwent revascularization per interventional radiology.  Hospital course follow-up neurology as well as cardiology and with noted history of recent CVA secondary to LV thrombus maintained on Eliquis was switched to Coumadin with Lovenox bridging INR goal 2.5.  Hospital course urinalysis positive nitrite 21-50 WBCs and placed on course of Rocephin x3 days.  Therapy evaluations completed due to patient decreased functional mobility right side weakness was admitted for a comprehensive rehab program. Complete NIHSS TOTAL: 5   Patient's medical record  from [facility] has been reviewed by the rehabilitation admission coordinator and physician.   Past Medical History      Past Medical History:  Diagnosis Date   Anxiety     Back pain     Discitis of lumbar region 11/16/2013    L3-4/notes 11/24/2013, arms, neck   Exertional asthma     GERD (gastroesophageal reflux disease)     Hypertension     Kidney stones      "have always passed them"   Neck pain     Osteomyelitis ([place]) 11/23/2013    osteomyelitis, discitis     Sleep concern      uses Prozac for sleep       Has the patient had major surgery during 100 days prior to admission? Yes   Family History   family history includes Hypertension in her father; Other in her father and mother.   Current Medications   Current Facility-Administered Medications:    acetaminophen (TYLENOL) tablet 650 mg, 650 mg, Oral, Q4H PRN, 650 mg at 02/03/22 0918 **OR** acetaminophen (TYLENOL) 160 MG/5ML solution 650 mg, 650 mg, Oral, Q4H PRN **OR** acetaminophen (TYLENOL) suppository 650 mg, 650 mg, Rectal, Q4H PRN, Rosalin Hawking, MD   ALPRAZolam Duanne Moron) tablet 0.5 mg, 0.5 mg, Oral, QHS, Rosalin Hawking, MD, 0.5 mg at 02/02/22 2056   aspirin EC tablet 81 mg, 81 mg, Oral, Daily, Rosalin Hawking, MD, 81 mg at 02/03/22 0919   cefTRIAXone (ROCEPHIN) 1 g in sodium chloride 0.9 % 100 mL IVPB, 1 g, Intravenous, Q24H, Smith, Rondell A, MD, Stopped at 02/03/22 0957   Chlorhexidine Gluconate Cloth 2 % PADS 6 each, 6 each, Topical, Daily, Bhagat, Srishti L, MD, 6 each at 02/03/22 0927   diphenhydrAMINE (BENADRYL) injection 12.5 mg, 12.5 mg, Intravenous, Q6H PRN, Tamala Julian, Rondell A, MD   docusate (COLACE) 50 MG/5ML liquid 100 mg, 100 mg, Oral, BID, Rosalin Hawking, MD, 100 mg at 02/03/22 0926   enoxaparin (LOVENOX) injection 60 mg, 60 mg, Subcutaneous, Q12H, Rosalin Hawking, MD, 60 mg at 02/03/22 0519   fentaNYL (SUBLIMAZE) injection 50 mcg, 50 mcg, Intravenous, Q3H PRN, Jacky Kindle, MD, 50 mcg at 02/02/22 0440   ferrous sulfate tablet 325 mg, 325 mg, Oral, Q breakfast, Palikh, Gaurang M, MD, 325 mg at 02/03/22 0919   hydrALAZINE (APRESOLINE) injection 10-40 mg, 10-40 mg, Intravenous, Q4H PRN, Rollene Rotunda, John D, PA-C   loratadine (CLARITIN) tablet 10 mg, 10 mg, Oral, Daily, Rosalin Hawking, MD, 10 mg at 02/03/22 0918   ondansetron (ZOFRAN) injection 4 mg, 4 mg, Intravenous, Q6H PRN, Tamala Julian, Rondell A, MD, 4 mg at 02/03/22 1108   Oral care mouth rinse, 15 mL, Mouth Rinse, PRN, Rosalin Hawking, MD   polyethylene glycol  (MIRALAX / GLYCOLAX) packet 17 g, 17 g, Oral, Daily, Rosalin Hawking, MD, 17 g at 02/03/22 0918   pregabalin (LYRICA) capsule 25 mg, 25 mg, Oral, BID, Rosalin Hawking, MD, 25 mg at 02/03/22 0918   rosuvastatin (CRESTOR) tablet 20 mg, 20 mg, Oral, Daily, Rosalin Hawking, MD, 20 mg at 02/03/22 7903   sacubitril-valsartan (ENTRESTO) 24-26 mg per tablet, 1 tablet, Oral, BID, Rosalin Hawking, MD, 1 tablet at 02/03/22 0918   sodium chloride flush (NS) 0.9 % injection 10-40 mL, 10-40 mL, Intracatheter, Q12H, Bhagat, Srishti L, MD, 40 mL at 02/03/22 0926   sodium chloride flush (NS) 0.9 % injection 10-40 mL, 10-40 mL, Intracatheter, PRN, Bhagat, Srishti L, MD   warfarin (COUMADIN) tablet 6 mg, 6 mg, Oral, ONCE-1600, Chen,  Fermin Schwab, [facility]   Warfarin - Pharmacist Dosing Inpatient, , Does not apply, q1600, Rosalin Hawking, MD, Given at 02/02/22 1720   Patients Current Diet:  Diet Order                  Diet regular Room service appropriate? Yes; Fluid consistency: Thin  Diet effective now                         Precautions / Restrictions Precautions Precautions: Other (comment) Precaution Comments: SBP goal < 160 Restrictions Weight Bearing Restrictions: No    Has the patient had 2 or more falls or a fall with injury in the past year? Yes   Prior Activity Level Community (5-7x/wk): was working and driving prior to August   Prior Functional Level Self Care: Did the patient need help bathing, dressing, using the toilet or eating? Independent   Indoor Mobility: Did the patient need assistance with walking from room to room (with or without device)? Independent   Stairs: Did the patient need assistance with internal or external stairs (with or without device)? Independent   Functional Cognition: Did the patient need help planning regular tasks such as shopping or remembering to take medications? Independent   Patient Information Are you of Hispanic, Latino/a,or Spanish origin?: A. No, not of Hispanic,  Latino/a, or Spanish origin (obtained via proxy) What is your race?: A. [place] (obtained via proxy) Do you need or want an interpreter to communicate with a doctor or health care staff?: 0. No (obtained via proxy)   Patient's Response To:  Health Literacy and Transportation Is the patient able to respond to health literacy and transportation needs?: no  Health Literacy - How often do you need to have someone help you when you read instructions, pamphlets, or other written material from your doctor or pharmacy?:Unable to respond In the past 12 months, has lack of transportation kept you from medical appointments or from getting medications?: No (obtained via proxy) In the past 12 months, has lack of transportation kept you from meetings, work, or from getting things needed for daily living?: No (obtained via proxy)   [place] / Equipment Home Equipment: Conservation officer, nature (2 wheels), Rollator (4 wheels), [employer] - single point, Civil engineer, contracting   Prior Device Use: Indicate devices/aids used by the patient prior to current illness, exacerbation or injury? None of the above   Current Functional Level Cognition   Arousal/Alertness: Awake/alert Overall Cognitive Status: Difficult to assess Difficult to assess due to: Impaired communication Orientation Level: Other (comment) (alert, unable to assess orientation due expressive aphasia) General Comments: pt with expressive commuincation deficits, using commuincation board as needed to express needs but also able to voice several words including "thank you". Focused Attention: Appears intact Sustained Attention: Appears intact Memory:  (unable to assess) Awareness:  (UTA) Problem Solving:  (UTA) Safety/Judgment: Other (comment) Comments: UTA secondary to nonverbal    Extremity Assessment (includes Sensation/Coordination)   Upper Extremity Assessment: RUE deficits/detail RUE Deficits / Details: demonstrates active 3-/5 shoulder movement,  gravity eliminated elbow flexion/extension.  Tone noted in forearm supinators and wrist flexors. RUE Sensation: decreased light touch, decreased proprioception RUE Coordination: decreased fine motor, decreased gross motor  Lower Extremity Assessment: Defer to PT evaluation     ADLs   Overall ADL's : Needs assistance/impaired Eating/Feeding: NPO Grooming: Minimal assistance, Moderate assistance, Standing Grooming Details (indicate cue type and reason): focus on integration of R hand/ue as much as possible  during tasks Upper Body Bathing: Minimal assistance, Sitting Lower Body Bathing: Minimal assistance, Sit to/from stand Upper Body Dressing : Minimal assistance, Sitting Lower Body Dressing: Minimal assistance, Sit to/from stand Toilet Transfer: Min guard, Ambulation Toileting- Clothing Manipulation and Hygiene: Supervision/safety, Sitting/lateral lean Functional mobility during ADLs: Min guard General ADL Comments: session focused on RUE AROM, stretch     Mobility   Overal bed mobility: Modified Independent Bed Mobility: Supine to Sit, Sit to Supine Supine to sit: Modified independent (Device/Increase time), HOB elevated Sit to supine: Modified independent (Device/Increase time), HOB elevated General bed mobility comments: HOB elevated, no assistance needed     Transfers   Overall transfer level: Needs assistance Equipment used: None Transfers: Sit to/from Stand Sit to Stand: Supervision General transfer comment: Supervision for safety, no LOB     Ambulation / Gait / Stairs / Wheelchair Mobility   Ambulation/Gait Ambulation/Gait assistance: Modified independent (Device/Increase time), Supervision Gait Distance (Feet): 160 Feet Assistive device: None Gait Pattern/deviations: WFL(Within Functional Limits) General Gait Details: Pt ambulating at a functional pace without LOB, even with DGI challenges. Progressed from supervision initially for safety to mod I. Holds R UE up in elbow  flexion and close to abdomen due to biceps spasticity. Gait velocity: reduced Gait velocity interpretation: >4.37 ft/sec, indicative of normal walking speed Stairs: Yes Stairs assistance: Supervision Stair Management: No rails, Alternating pattern, Forwards Number of Stairs: 3 General stair comments: Ascends and descends stairs without LOB, supervision for safety     Posture / Balance Balance Overall balance assessment: Needs assistance Sitting-balance support: No upper extremity supported, Feet supported Sitting balance-Leahy Scale: Normal Standing balance support: No upper extremity supported, During functional activity Standing balance-Leahy Scale: Normal Standing balance comment: No LOB when challenging dynamic gait balance Standardized Balance Assessment Standardized Balance Assessment : Dynamic Gait Index Dynamic Gait Index Level Surface: Normal Change in Gait Speed: Normal Gait with Horizontal Head Turns: Normal Gait with Vertical Head Turns: Normal Gait and Pivot Turn: Normal Step Over Obstacle: Normal Step Around Obstacles: Normal Steps: Normal Total Score: 24     Special needs/care consideration Skin Ecchymosis: arm, abdomen, hip/bilateral, upper, lower, left, right    Previous Home Environment (from acute therapy documentation) Living Arrangements: Spouse/significant other  Lives With: Spouse Available Help at Discharge: Family, Friend(s), Available 24 hours/day Type of Home: House Home Layout: Two level Alternate Level Stairs-Rails: Right Alternate Level Stairs-Number of Steps: flight Home Access: Stairs to enter Entrance Stairs-Rails: Left Entrance Stairs-Number of Steps: 5 Bathroom Shower/Tub: Gaffer, Chiropodist: Standard Bathroom Accessibility: Yes How Accessible: Accessible via walker Home Care Services: No Additional Comments: Husband works, able to take time off initially if needed   Discharge Living Setting Plans for  Discharge Living Setting: Patient's home Type of Home at Discharge: House Discharge Home Layout: Two level Alternate Level Stairs-Rails: Right Alternate Level Stairs-Number of Steps: flight Discharge Home Access: Stairs to enter Entrance Stairs-Rails: Left Entrance Stairs-Number of Steps: 5 Discharge Bathroom Shower/Tub: Walk-in shower, Tub/shower unit Discharge Bathroom Toilet: Standard Discharge Bathroom Accessibility: Yes How Accessible: Accessible via walker   Social/Family/Support Systems Anticipated Caregiver: Melainie Krinsky, husband and family/friends Anticipated Caregiver's Contact Information: [phone] Caregiver Availability: 24/7 Discharge Plan Discussed with Primary Caregiver: Yes Is Caregiver In Agreement with Plan?: Yes Does Caregiver/Family have Issues with Lodging/Transportation while Pt is in Rehab?: No   Goals Patient/Family Goal for Rehab: Supervision: OT. Min-Mod A:ST Expected length of stay: 7-10 days Pt/Family Agrees to Admission and willing to participate: Yes Program  Orientation Provided & Reviewed with Pt/Caregiver Including Roles  & Responsibilities: Yes   Decrease burden of Care through IP rehab admission: NA   Possible need for SNF placement upon discharge: Not anticipated   Patient Condition: I have reviewed medical records from [facility], spoken with CM, and patient, spouse, and daughter. I met with patient at the bedside for inpatient rehabilitation assessment.  Patient will benefit from ongoing OT and SLP, can actively participate in 3 hours of therapy a day 5 days of the week, and can make measurable gains during the admission.  Patient will also benefit from the coordinated team approach during an Inpatient Acute Rehabilitation admission.  The patient will receive intensive therapy as well as Rehabilitation physician, nursing, social worker, and care management interventions.  Due to safety, skin/wound care, disease management, medication  administration, pain management, and patient education the patient requires 24 hour a day rehabilitation nursing.  The patient is currently min A-mod I  with mobility and basic ADLs.  Discharge setting and therapy post discharge at home with outpatient is anticipated.  Patient has agreed to participate in the Acute Inpatient Rehabilitation Program and will admit today.   Preadmission Screen Completed By:  Bethel Born, 02/03/2022 4:08 PM ______________________________________________________________________   Discussed status with Dr. Curlene Dolphin on 02/04/22 at 51 and received approval for admission today.   Admission Coordinator:  Bethel Born, [place] updates by Clemens Catholic, MS, CCC-SLP time 10:14 on 02/04/22    Assessment/Plan: Diagnosis: CVA Does the need for close, 24 hr/day Medical supervision in concert with the patient's rehab needs make it unreasonable for this patient to be served in a less intensive setting? Yes Co-Morbidities requiring supervision/potential complications:  UTI, HTN, HLD, Cardiomyopathy, Lumbar stenosis with foot drop, back pain Due to bladder management, bowel management, safety, skin/wound care, disease management, medication administration, pain management, and patient education, does the patient require 24 hr/day rehab nursing? Yes Does the patient require coordinated care of a physician, rehab nurse, PT, OT, and SLP to address physical and functional deficits in the context of the above medical diagnosis(es)? Yes Addressing deficits in the following areas: balance, endurance, locomotion, strength, transferring, bowel/bladder control, bathing, dressing, feeding, grooming, toileting, cognition, speech, language, and psychosocial support Can the patient actively participate in an intensive therapy program of at least 3 hrs of therapy 5 days a week? Yes The potential for patient to make measurable gains while on inpatient rehab is  excellent Anticipated functional outcomes upon discharge from inpatient rehab: n/a PT, supervision OT, min assist and mod assist SLP Estimated rehab length of stay to reach the above functional goals is: 7-10 days Anticipated discharge destination: Home 10. Overall Rehab/Functional Prognosis: excellent     MD Signature: Yuri Shtridelman Height: 5\' 1"  (154.9 cm) Weight: 59 kg   Insurance Information HMO:     PPO: yes     PCP:      IPA:      80/20:      OTHER:  PRIMARY: BCBS/Federal Emp      Policy#: [id number]      Subscriber: [place] Name: Edrick Oh       Phone#: [phone]     Fax#: [phone] Beverlee Nims with BCBS called with Josem Kaufmann on 9/12 with approval 9/12-9/20 with updates due 8/26 Pre-Cert#: [id number]      Employer:  Benefits:  Phone #: [phone]     Name:  Eff. Date: 06/01/17-still active     Deduct: $350 ($350 met)  Out of Pocket Max: $6,000 ($3,278 met)      Life Max: NA CIR: $450 copay/admission      SNF: $275 copay/admission; limited to 30 days/cal yr Outpatient: 65% coverage , limited to 75 visits/cal yr    Co-Pay: 35% co-insurance Home Health: 65% coverage, limited to 50 visits/cal yr      Co-Pay: 35% co-insurance DME: 65% coverage     Co-Pay: 35% co-insurance Providers: in-network SECONDARY:       Policy#:      Phone#:    Development worker, community:       Phone#:    The Engineer, petroleum" for patients in Inpatient Rehabilitation Facilities with attached "Privacy Act [place] Records" was provided and verbally reviewed with: N/A   Emergency Contact Information Contact Information       Name Relation Home Work Mobile    [place] [phone]   [phone]    [place],Vincent Son [phone]   [phone]    [place],Emily Daughter [phone]   [phone]    [place] Koller Daughter [phone]               Current Medical History  Patient Admitting Diagnosis: CVA History of Present Illness: Chloe Johnson  is a 66 year old right-handed female with history significant for 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  Per chart review patient lives with spouse.  Multilevel home.  Husband works during the day.  Patient reported independent prior to admission without assistive device.  Presented 01/28/2022 with acute onset of right-sided weakness and aphasia.  Patient just recently returned home from traveling to [place] 5 days ago.  CT/MRI showed new acute infarct in left posterior frontal and anterior parietal cortex.  MRA showed no intracranial large vessel occlusion or significant stenosis.  Underwent bilateral common carotid arteriogram as well as right vertebral artery angiogram showing occluded superior division of left MCA artery in the M2 region and underwent revascularization per interventional radiology.  Hospital course follow-up neurology as well as cardiology and with noted history of recent CVA secondary to LV thrombus maintained on Eliquis was switched to Coumadin with Lovenox bridging INR goal 2.5.  Hospital course urinalysis positive nitrite 21-50 WBCs and placed on course of Rocephin x3 days.  Therapy evaluations completed due to patient decreased functional mobility right side weakness was admitted for a comprehensive rehab program. Complete NIHSS TOTAL: 5   Patient's medical record from [facility] has been reviewed by the rehabilitation admission coordinator and physician.   Past Medical History      Past Medical History:  Diagnosis Date   Anxiety     Back pain     Discitis of lumbar region 11/16/2013    L3-4/notes 11/24/2013, arms,  neck   Exertional asthma     GERD (gastroesophageal reflux disease)     Hypertension     Kidney stones      "have always passed them"   Neck pain     Osteomyelitis ([place]) 11/23/2013    osteomyelitis, discitis    Sleep concern      uses Prozac for sleep       Has the patient had major surgery during 100 days prior to admission? Yes   Family History   family history includes Hypertension in  her father; Other in her father and mother.   Current Medications   Current Facility-Administered Medications:    acetaminophen (TYLENOL) tablet 650 mg, 650 mg, Oral, Q4H PRN, 650 mg at 02/03/22 0918 **OR** acetaminophen (TYLENOL) 160 MG/5ML solution 650 mg, 650 mg, Oral, Q4H PRN **OR** acetaminophen (TYLENOL) suppository 650 mg, 650 mg, Rectal, Q4H PRN, Rosalin Hawking, MD   ALPRAZolam Duanne Moron) tablet 0.5 mg, 0.5 mg, Oral, QHS, Rosalin Hawking, MD, 0.5 mg at 02/02/22 2056   aspirin EC tablet 81 mg, 81 mg, Oral, Daily, Rosalin Hawking, MD, 81 mg at 02/03/22 0919   cefTRIAXone (ROCEPHIN) 1 g in sodium chloride 0.9 % 100 mL IVPB, 1 g, Intravenous, Q24H, Smith, Rondell A, MD, Stopped at 02/03/22 0957   Chlorhexidine Gluconate Cloth 2 % PADS 6 each, 6 each, Topical, Daily, Bhagat, Srishti L, MD, 6 each at 02/03/22 0927   diphenhydrAMINE (BENADRYL) injection 12.5 mg, 12.5 mg, Intravenous, Q6H PRN, Tamala Julian, Rondell A, MD   docusate (COLACE) 50 MG/5ML liquid 100 mg, 100 mg, Oral, BID, Rosalin Hawking, MD, 100 mg at 02/03/22 0926   enoxaparin (LOVENOX) injection 60 mg, 60 mg, Subcutaneous, Q12H, Rosalin Hawking, MD, 60 mg at 02/03/22 0519   fentaNYL (SUBLIMAZE) injection 50 mcg, 50 mcg, Intravenous, Q3H PRN, Jacky Kindle, MD, 50 mcg at 02/02/22 0440   ferrous sulfate tablet 325 mg, 325 mg, Oral, Q breakfast, Palikh, Gaurang M, MD, 325 mg at 02/03/22 0919   hydrALAZINE (APRESOLINE) injection 10-40 mg, 10-40 mg, Intravenous, Q4H PRN, Rollene Rotunda, John D, PA-C   loratadine (CLARITIN) tablet 10 mg, 10 mg, Oral, Daily, Rosalin Hawking, MD, 10 mg  at 02/03/22 0918   ondansetron (ZOFRAN) injection 4 mg, 4 mg, Intravenous, Q6H PRN, Tamala Julian, Rondell A, MD, 4 mg at 02/03/22 1108   Oral care mouth rinse, 15 mL, Mouth Rinse, PRN, Rosalin Hawking, MD   polyethylene glycol (MIRALAX / GLYCOLAX) packet 17 g, 17 g, Oral, Daily, Rosalin Hawking, MD, 17 g at 02/03/22 0918   pregabalin (LYRICA) capsule 25 mg, 25 mg, Oral, BID, Rosalin Hawking, MD, 25 mg at 02/03/22 0918   rosuvastatin (CRESTOR) tablet 20 mg, 20 mg, Oral, Daily, Rosalin Hawking, MD, 20 mg at 02/03/22 1017   sacubitril-valsartan (ENTRESTO) 24-26 mg per tablet, 1 tablet, Oral, BID, Rosalin Hawking, MD, 1 tablet at 02/03/22 0918   sodium chloride flush (NS) 0.9 % injection 10-40 mL, 10-40 mL, Intracatheter, Q12H, Bhagat, Srishti L, MD, 40 mL at 02/03/22 0926   sodium chloride flush (NS) 0.9 % injection 10-40 mL, 10-40 mL, Intracatheter, PRN, Bhagat, Srishti L, MD   warfarin (COUMADIN) tablet 6 mg, 6 mg, Oral, ONCE-1600, Donnamae Jude, [facility]   Warfarin - Pharmacist Dosing Inpatient, , Does not apply, q1600, Rosalin Hawking, MD, Given at 02/02/22 1720   Patients Current Diet:  Diet Order                  Diet regular Room service appropriate? Yes; Fluid consistency: Thin  Diet effective now                         Precautions / Restrictions Precautions Precautions: Other (comment) Precaution Comments: SBP goal < 160 Restrictions Weight Bearing Restrictions: No    Has the patient had 2 or more falls or a fall with injury in the past year? Yes   Prior Activity Level Community (5-7x/wk): was working and driving prior to August   Prior Functional Level Self Care: Did the patient need  help bathing, dressing, using the toilet or eating? Independent   Indoor Mobility: Did the patient need assistance with walking from room to room (with or without device)? Independent   Stairs: Did the patient need assistance with internal or external stairs (with or without device)? Independent   Functional Cognition:  Did the patient need help planning regular tasks such as shopping or remembering to take medications? Independent   Patient Information Are you of Hispanic, Latino/a,or Spanish origin?: A. No, not of Hispanic, Latino/a, or Spanish origin What is your race?: X. Patient unable to respond, A. [place] Do you need or want an interpreter to communicate with a doctor or health care staff?: 0. No   Patient's Response To:  Health Literacy and Transportation Is the patient able to respond to health literacy and transportation needs?: Yes Health Literacy - How often do you need to have someone help you when you read instructions, pamphlets, or other written material from your doctor or pharmacy?: Never In the past 12 months, has lack of transportation kept you from medical appointments or from getting medications?: No In the past 12 months, has lack of transportation kept you from meetings, work, or from getting things needed for daily living?: No   Home Assistive Devices / Equipment Home Equipment: Conservation officer, nature (2 wheels), Rollator (4 wheels), [employer] - single point, Civil engineer, contracting   Prior Device Use: Indicate devices/aids used by the patient prior to current illness, exacerbation or injury? None of the above   Current Functional Level Cognition   Arousal/Alertness: Awake/alert Overall Cognitive Status: Difficult to assess Difficult to assess due to: Impaired communication Orientation Level: Other (comment) (alert, unable to assess orientation due expressive aphasia) General Comments: pt with expressive commuincation deficits, using commuincation board as needed to express needs but also able to voice several words including "thank you". Focused Attention: Appears intact Sustained Attention: Appears intact Memory:  (unable to assess) Awareness:  (UTA) Problem Solving:  (UTA) Safety/Judgment: Other (comment) Comments: UTA secondary to nonverbal    Extremity Assessment (includes  Sensation/Coordination)   Upper Extremity Assessment: RUE deficits/detail RUE Deficits / Details: demonstrates active 3-/5 shoulder movement, gravity eliminated elbow flexion/extension.  Tone noted in forearm supinators and wrist flexors. RUE Sensation: decreased light touch, decreased proprioception RUE Coordination: decreased fine motor, decreased gross motor  Lower Extremity Assessment: Defer to PT evaluation     ADLs   Overall ADL's : Needs assistance/impaired Eating/Feeding: NPO Grooming: Minimal assistance, Moderate assistance, Standing Grooming Details (indicate cue type and reason): focus on integration of R hand/ue as much as possible during tasks Upper Body Bathing: Minimal assistance, Sitting Lower Body Bathing: Minimal assistance, Sit to/from stand Upper Body Dressing : Minimal assistance, Sitting Lower Body Dressing: Minimal assistance, Sit to/from stand Toilet Transfer: Min guard, Ambulation Toileting- Clothing Manipulation and Hygiene: Supervision/safety, Sitting/lateral lean Functional mobility during ADLs: Min guard General ADL Comments: session focused on RUE AROM, stretch     Mobility   Overal bed mobility: Modified Independent Bed Mobility: Supine to Sit, Sit to Supine Supine to sit: Modified independent (Device/Increase time), HOB elevated Sit to supine: Modified independent (Device/Increase time), HOB elevated General bed mobility comments: HOB elevated, no assistance needed     Transfers   Overall transfer level: Needs assistance Equipment used: None Transfers: Sit to/from Stand Sit to Stand: Supervision General transfer comment: Supervision for safety, no LOB     Ambulation / Gait / Stairs / Wheelchair Mobility   Ambulation/Gait Ambulation/Gait assistance: Modified independent (Device/Increase time), Supervision Gait  Distance (Feet): 160 Feet Assistive device: None Gait Pattern/deviations: WFL(Within Functional Limits) General Gait Details: Pt  ambulating at a functional pace without LOB, even with DGI challenges. Progressed from supervision initially for safety to mod I. Holds R UE up in elbow flexion and close to abdomen due to biceps spasticity. Gait velocity: reduced Gait velocity interpretation: >4.37 ft/sec, indicative of normal walking speed Stairs: Yes Stairs assistance: Supervision Stair Management: No rails, Alternating pattern, Forwards Number of Stairs: 3 General stair comments: Ascends and descends stairs without LOB, supervision for safety     Posture / Balance Balance Overall balance assessment: Needs assistance Sitting-balance support: No upper extremity supported, Feet supported Sitting balance-Leahy Scale: Normal Standing balance support: No upper extremity supported, During functional activity Standing balance-Leahy Scale: Normal Standing balance comment: No LOB when challenging dynamic gait balance Standardized Balance Assessment Standardized Balance Assessment : Dynamic Gait Index Dynamic Gait Index Level Surface: Normal Change in Gait Speed: Normal Gait with Horizontal Head Turns: Normal Gait with Vertical Head Turns: Normal Gait and Pivot Turn: Normal Step Over Obstacle: Normal Step Around Obstacles: Normal Steps: Normal Total Score: 24     Special needs/care consideration Skin Ecchymosis: arm, abdomen, hip/bilateral, upper, lower, left, right    Previous Home Environment (from acute therapy documentation) Living Arrangements: Spouse/significant other  Lives With: Spouse Available Help at Discharge: Family, Friend(s), Available 24 hours/day Type of Home: House Home Layout: Two level Alternate Level Stairs-Rails: Right Alternate Level Stairs-Number of Steps: flight Home Access: Stairs to enter Entrance Stairs-Rails: Left Entrance Stairs-Number of Steps: 5 Bathroom Shower/Tub: Gaffer, Chiropodist: Standard Bathroom Accessibility: Yes How Accessible: Accessible via  walker Home Care Services: No Additional Comments: Husband works, able to take time off initially if needed   Discharge Living Setting Plans for Discharge Living Setting: Patient's home Type of Home at Discharge: House Discharge Home Layout: Two level Alternate Level Stairs-Rails: Right Alternate Level Stairs-Number of Steps: flight Discharge Home Access: Stairs to enter Entrance Stairs-Rails: Left Entrance Stairs-Number of Steps: 5 Discharge Bathroom Shower/Tub: Walk-in shower, Tub/shower unit Discharge Bathroom Toilet: Standard Discharge Bathroom Accessibility: Yes How Accessible: Accessible via walker   Social/Family/Support Systems Anticipated Caregiver: Janiqua Friscia, husband and family/friends Anticipated Caregiver's Contact Information: [phone] Caregiver Availability: 24/7 Discharge Plan Discussed with Primary Caregiver: Yes Is Caregiver In Agreement with Plan?: Yes Does Caregiver/Family have Issues with Lodging/Transportation while Pt is in Rehab?: No   Goals Patient/Family Goal for Rehab: Supervision: OT. Min-Mod A:ST Expected length of stay: 7-10 days Pt/Family Agrees to Admission and willing to participate: Yes Program Orientation Provided & Reviewed with Pt/Caregiver Including Roles  & Responsibilities: Yes   Decrease burden of Care through IP rehab admission: NA   Possible need for SNF placement upon discharge: Not anticipated   Patient Condition: I have reviewed medical [facility] spoken with CM, and patient, spouse, and daughter. I met with patient at the bedside for inpatient rehabilitation assessment.  Patient will benefit from ongoing OT and SLP, can actively participate in 3 hours of therapy a day 5 days of the week, and can make measurable gains during the admission.  Patient will also benefit from the coordinated team approach during an Inpatient Acute Rehabilitation admission.  The patient will receive intensive therapy as well as  Rehabilitation physician, nursing, social worker, and care management interventions.  Due to safety, skin/wound care, disease management, medication administration, pain management, and patient education the patient requires 24 hour a day rehabilitation nursing.  The  patient is currently min A-mod I  with mobility and basic ADLs.  Discharge setting and therapy post discharge at home with outpatient is anticipated.  Patient has agreed to participate in the Acute Inpatient Rehabilitation Program and will admit today.   Preadmission Screen Completed By:  Bethel Born, 02/03/2022 4:08 PM ______________________________________________________________________   Discussed status with Dr. Curlene Dolphin on 02/04/22 at 53 and received approval for admission today.   Admission Coordinator:  Bethel Born, [place] updates by Clemens Catholic, MS, CCC-SLP time 10:14 on 02/04/22    Assessment/Plan: Diagnosis: CVA Does the need for close, 24 hr/day Medical supervision in concert with the patient's rehab needs make it unreasonable for this patient to be served in a less intensive setting? Yes Co-Morbidities requiring supervision/potential complications:  UTI, HTN, HLD, Cardiomyopathy, Lumbar stenosis with foot drop, back pain Due to bladder management, bowel management, safety, skin/wound care, disease management, medication administration, pain management, and patient education, does the patient require 24 hr/day rehab nursing? Yes Does the patient require coordinated care of a physician, rehab nurse, PT, OT, and SLP to address physical and functional deficits in the context of the above medical diagnosis(es)? Yes Addressing deficits in the following areas: balance, endurance, locomotion, strength, transferring, bowel/bladder control, bathing, dressing, feeding, grooming, toileting, cognition, speech, language, and psychosocial support Can the patient actively participate in an intensive therapy  program of at least 3 hrs of therapy 5 days a week? Yes The potential for patient to make measurable gains while on inpatient rehab is excellent Anticipated functional outcomes upon discharge from inpatient rehab: n/a PT, supervision OT, min assist and mod assist SLP Estimated rehab length of stay to reach the above functional goals is: 7-10 days Anticipated discharge destination: Home 10. Overall Rehab/Functional Prognosis: excellent     MD Signature: Jennye Boroughs

## 2022-02-05 ENCOUNTER — Inpatient Hospital Stay (HOSPITAL_BASED_OUTPATIENT_CLINIC_OR_DEPARTMENT_OTHER): Payer: Medicare Other

## 2022-02-05 DIAGNOSIS — I63512 Cerebral infarction due to unspecified occlusion or stenosis of left middle cerebral artery: Secondary | ICD-10-CM | POA: Diagnosis not present

## 2022-02-05 DIAGNOSIS — I724 Aneurysm of artery of lower extremity: Secondary | ICD-10-CM

## 2022-02-05 LAB — URINE CULTURE: Culture: >=100000 — AB

## 2022-02-05 LAB — CBC WITH DIFFERENTIAL/PLATELET
Abs Immature Granulocytes: 0.02 10*3/uL (ref 0.00–0.07)
Basophils Absolute: 0.1 10*3/uL (ref 0.0–0.1)
Basophils Relative: 1 %
Eosinophils Absolute: 0.1 10*3/uL (ref 0.0–0.5)
Eosinophils Relative: 1 %
HCT: 33.6 % — ABNORMAL LOW (ref 36.0–46.0)
Hemoglobin: 10.8 g/dL — ABNORMAL LOW (ref 12.0–15.0)
Immature Granulocytes: 0 %
Lymphocytes Relative: 23 %
Lymphs Abs: 1.5 10*3/uL (ref 0.7–4.0)
MCH: 29 pg (ref 26.0–34.0)
MCHC: 32.1 g/dL (ref 30.0–36.0)
MCV: 90.1 fL (ref 80.0–100.0)
Monocytes Absolute: 0.5 10*3/uL (ref 0.1–1.0)
Monocytes Relative: 8 %
Neutro Abs: 4.4 10*3/uL (ref 1.7–7.7)
Neutrophils Relative %: 67 %
Platelets: 289 10*3/uL (ref 150–400)
RBC: 3.73 MIL/uL — ABNORMAL LOW (ref 3.87–5.11)
RDW: 12.2 % (ref 11.5–15.5)
WBC: 6.5 10*3/uL (ref 4.0–10.5)
nRBC: 0 % (ref 0.0–0.2)

## 2022-02-05 LAB — COMPREHENSIVE METABOLIC PANEL
ALT: 18 U/L (ref 0–44)
AST: 24 U/L (ref 15–41)
Albumin: 3.7 g/dL (ref 3.5–5.0)
Alkaline Phosphatase: 50 U/L (ref 38–126)
Anion gap: 6 (ref 5–15)
BUN: 8 mg/dL (ref 8–23)
CO2: 26 mmol/L (ref 22–32)
Calcium: 9.3 mg/dL (ref 8.9–10.3)
Chloride: 108 mmol/L (ref 98–111)
Creatinine, Ser: 0.68 mg/dL (ref 0.44–1.00)
GFR, Estimated: >60 mL/min (ref >60)
Glucose, Bld: 113 mg/dL — ABNORMAL HIGH (ref 70–99)
Potassium: 3.6 mmol/L (ref 3.5–5.1)
Sodium: 140 mmol/L (ref 135–145)
Total Bilirubin: 0.7 mg/dL (ref 0.3–1.2)
Total Protein: 7 g/dL (ref 6.5–8.1)

## 2022-02-05 LAB — PROTIME-INR
INR: 2.5 — ABNORMAL HIGH (ref 0.8–1.2)
Prothrombin Time: 26.6 seconds — ABNORMAL HIGH (ref 11.4–15.2)

## 2022-02-05 MED ORDER — WARFARIN SODIUM 2 MG PO TABS
2.0000 mg | ORAL_TABLET | Freq: Once | ORAL | Status: AC
  Administered 2022-02-05: 2 mg via ORAL
  Filled 2022-02-05: qty 1

## 2022-02-05 MED ORDER — SODIUM CHLORIDE 0.9% FLUSH: 10.0000 mL | INTRAVENOUS | Status: DC | PRN

## 2022-02-05 MED ORDER — NITROFURANTOIN MONOHYD MACRO 100 MG PO CAPS
100.0000 mg | ORAL_CAPSULE | Freq: Two times a day (BID) | ORAL | Status: DC
  Administered 2022-02-05 – 2022-02-07 (×4): 100 mg via ORAL
  Filled 2022-02-05 (×6): qty 1

## 2022-02-05 MED ORDER — ORAL CARE MOUTH RINSE
15.0000 mL | OROMUCOSAL | Status: DC
  Administered 2022-02-06 – 2022-02-07 (×5): 15 mL via OROMUCOSAL

## 2022-02-05 MED ORDER — ONDANSETRON HCL 4 MG PO TABS
4.0000 mg | ORAL_TABLET | Freq: Three times a day (TID) | ORAL | Status: DC | PRN
  Filled 2022-02-05: qty 1

## 2022-02-05 MED ORDER — PREGABALIN 50 MG PO CAPS
100.0000 mg | ORAL_CAPSULE | Freq: Two times a day (BID) | ORAL | Status: DC
  Administered 2022-02-05 – 2022-02-06 (×2): 100 mg via ORAL
  Filled 2022-02-05 (×2): qty 2

## 2022-02-05 MED ORDER — SODIUM CHLORIDE 0.9% FLUSH
10.0000 mL | Freq: Two times a day (BID) | INTRAVENOUS | Status: DC
  Administered 2022-02-05: 10 mL

## 2022-02-05 MED ORDER — ORAL CARE MOUTH RINSE: 15.0000 mL | OROMUCOSAL | Status: DC | PRN

## 2022-02-05 MED ORDER — CHLORHEXIDINE GLUCONATE CLOTH 2 % EX PADS
6.0000 | MEDICATED_PAD | Freq: Every day | CUTANEOUS | Status: DC
  Administered 2022-02-05 – 2022-02-06 (×2): 6 via TOPICAL

## 2022-02-05 NOTE — Discharge Summary (Signed)
Physician Discharge Summary  Patient ID: Chloe Johnson MRN: [id number] DOB/AGE: 1955/07/05 66 y.o.  Admit date: 02/04/2022 Discharge date: 02/07/2022  Discharge Diagnoses:  Principal Problem:   Left middle cerebral artery stroke [facility]) UTI Hypertension Diastolic congestive heart failure Hyperlipidemia Constipation Left ventricular thrombus Left groin hematoma   Discharged Condition: Stable  Significant Diagnostic Studies: VAS [place] GROIN PSEUDOANEURYSM  Result Date: 02/06/2022  ARTERIAL PSEUDOANEURYSM  Patient Name:  Chloe Johnson  Date of Exam:   02/05/2022 Medical Rec #: [id number]     Accession #:    [id number] Date of Birth: 1955/10/13     Patient Gender: F Patient Age:   72 years Exam Location:  [facility] Procedure:      VAS [place] GROIN PSEUDOANEURYSM Referring Phys: Lauraine Rinne --------------------------------------------------------------------------------  Exam: Left groin Indications: Patient complains of bruising and palpable knot. Comparison Study: 02/02/2022 - Summary: No evidence of pseudoaneurysm, AVF or                   DVT. A mixed echogenic structure measuring approximately 1.0                   cm x 0.9 cm is visualized at the left groin with ultrasound                   characteristics of a hematoma. Performing Technologist: Oliver Hum RVT  Examination Guidelines: A complete evaluation includes B-mode imaging, spectral Doppler, color Doppler, and power Doppler as needed of all accessible portions of each vessel. Bilateral testing is considered an integral part of a complete examination. Limited examinations for reoccurring indications may be performed as noted. +------------+----------+---------+------+----------+ Right DuplexPSV (cm/s)Waveform PlaqueComment(s) +------------+----------+---------+------+----------+ CFA            180    triphasic                 +------------+----------+---------+------+----------+ Prox SFA       127    triphasic                  +------------+----------+---------+------+----------+  Summary: No evidence of pseudoaneurysm. Results are unchanged from the study performed on 02/02/2022. Diagnosing physician: Jamelle Haring Electronically signed by Jamelle Haring on 02/06/2022 at 4:20:27 PM.   --------------------------------------------------------------------------------    Final    VAS [place] GROIN PSEUDOANEURYSM  Result Date: 02/03/2022  ARTERIAL PSEUDOANEURYSM  Patient Name:  Chloe Johnson  Date of Exam:   02/02/2022 Medical Rec #: [id number]     Accession #:    [id number] Date of Birth: 02-12-1956     Patient Gender: F Patient Age:   30 years Exam Location:  [facility] Procedure:      VAS [place] GROIN PSEUDOANEURYSM Referring Phys: Soyla Dryer --------------------------------------------------------------------------------  Exam: Left groin Indications: Patient complains of bruising. History: S/p catheterization. Comparison Study: No prior study Performing Technologist: Maudry Mayhew MHA, RDMS, RVT, RDCS  Examination Guidelines: A complete evaluation includes B-mode imaging, spectral Doppler, color Doppler, and power Doppler as needed of all accessible portions of each vessel. Bilateral testing is considered an integral part of a complete examination. Limited examinations for reoccurring indications may be performed as noted. +-----------+----------+---------+------+----------+ Left DuplexPSV (cm/s)Waveform PlaqueComment(s) +-----------+----------+---------+------+----------+ Ext Iliac     166    triphasic                 +-----------+----------+---------+------+----------+ CFA            66    triphasic                 +-----------+----------+---------+------+----------+  PFA            73    triphasic                 +-----------+----------+---------+------+----------+ Prox SFA       77    triphasic                 +-----------+----------+---------+------+----------+ Left Vein comments: left  CFV patent  Summary: No evidence of pseudoaneurysm, AVF or DVT. A mixed echogenic structure measuring approximately 1.0 cm x 0.9 cm is visualized at the left groin with ultrasound characteristics of a hematoma.  Diagnosing physician: Monica Martinez MD Electronically signed by Monica Martinez MD on 02/03/2022 at 1:41:04 PM.    --------------------------------------------------------------------------------    Final    DG Swallowing Func-Speech Pathology  Result Date: 01/30/2022 Table formatting from the original result was not included. Images from the original result were not included. Objective Swallowing Evaluation: Type of Study: MBS-Modified Barium Swallow Study  Patient Details Name: Chloe Johnson MRN: [id number] Date of Birth: 1955/10/15 Today's Date: 01/30/2022 Time: SLP Start Time (ACUTE ONLY): 1135 -SLP Stop Time (ACUTE ONLY): 1200 SLP Time Calculation (min) (ACUTE ONLY): 25 min Past Medical History: Past Medical History: Diagnosis Date  Anxiety   Back pain   Discitis of lumbar region 11/16/2013  L3-4/notes 11/24/2013, arms, neck  Exertional asthma   GERD (gastroesophageal reflux disease)   Hypertension   Kidney stones   "have always passed them"  Neck pain   Osteomyelitis ([place]) 11/23/2013  osteomyelitis, discitis   Sleep concern   uses Prozac for sleep  Past Surgical History: Past Surgical History: Procedure Laterality Date  BREAST CYST EXCISION Left 2010  "polypectomy"  IR CT HEAD LTD  01/21/2022  IR CT HEAD LTD  01/28/2022  IR PERCUTANEOUS ART THROMBECTOMY/INFUSION INTRACRANIAL INC DIAG ANGIO  01/21/2022  IR PERCUTANEOUS ART THROMBECTOMY/INFUSION INTRACRANIAL INC DIAG ANGIO  01/28/2022  IR US GUIDE VASC ACCESS RIGHT  01/22/2022  PICC LINE PLACE PERIPHERAL ([place] HX) Right   for use of Levaquin & Vancomycin, of note: she reports that she had a "flulike feeling"   RADIOLOGY WITH ANESTHESIA N/A 01/21/2022  Procedure: IR WITH ANESTHESIA;  Surgeon: Luanne Bras, MD;  Location: [place];  Service: Radiology;   Laterality: N/A;  RADIOLOGY WITH ANESTHESIA N/A 01/28/2022  Procedure: IR WITH ANESTHESIA;  Surgeon: Radiologist, Medication, MD;  Location: [place];  Service: Radiology;  Laterality: N/A;  TONSILLECTOMY AND ADENOIDECTOMY  1975  TUBAL LIGATION  1999 HPI: Patient is a 66 y.o. 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 On 01/25/22, patient began to have symptoms of dizziness followed by nausea and vomiting. Symptoms improved but then on 9/5 but she felt generally weak and was having trouble finding words. Husband observed right facial droop. She presented to [facility] ED as code stroke on 01/28/22; CT head without acute abnormality but CTA unable to be performed due to IV infiltration. She was taken directly to IR for thrombectomy and intubated for this procedure, extubated in morning of 01/29/22. MRI brain showed new acute infarcts in left posterior frontal and anterior parietal cortex.  Subjective: awake, alert, husband and two daughters present in room  Recommendations for follow up therapy are one component of a multi-disciplinary discharge planning process, led by the attending physician.  Recommendations may be updated based on patient status, additional  functional criteria and insurance authorization. Assessment / Plan / Recommendation   01/30/2022   1:15 PM Clinical Impressions Clinical Impression Pt demonstrates oropharygneal dysphagia with mild oral impairment secondary to right CN VII and XII weakness and potential right sided sensory changes. Pt has decreased labial seal on the right with anterior spillage, improved with cues to increase labial tension with straw or cup. There is also decreased bolus cohesion and mild lingual residue unless cued for an oral hold. Pt initiates swallow as the bolus arrives past the valleculae resulting in instances of flash  penetration or sensed aspiration (consecutive straw sips). Pt improved with single sips and oral hold prior to swallow. There was questionable trace silent aspiration of light barium pooled in pyriforms; penetrated post swallow. However this finding is inconclusive and  likely negligible given pts sensation and ejection of mild aspirate in prior event. Recommend initiating nectar thick liquids and dys 3 solids to aid in pt relearning swallowing motor patterns with a slower, more manageable texture prior to advancement to thin in 1-2 days if successful. 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);Dysphagia, unspecified (R13.10) Impact on safety and function Mild aspiration risk     01/30/2022   1:15 PM Treatment Recommendations Treatment Recommendations Therapy as outlined in treatment plan below     01/30/2022   1:15 PM Prognosis Prognosis for Safe Diet Advancement Good   01/30/2022   1:15 PM Diet Recommendations SLP Diet Recommendations Nectar thick liquid;Thin liquid;Dysphagia 3 (Mech soft) solids Liquid Administration via Cup;Straw Medication Administration Whole meds with puree Compensations Slow rate;Small sips/bites;Clear throat intermittently Postural Changes Remain semi-upright after after feeds/meals (Comment);Seated upright at 90 degrees     01/30/2022   1:15 PM Other Recommendations Oral Care Recommendations Oral care BID Other Recommendations Have oral suction available Follow Up Recommendations Acute inpatient rehab (3hours/day) Assistance recommended at discharge Frequent or constant Supervision/Assistance Functional Status Assessment Patient has had a recent decline in their functional status and demonstrates the ability to make significant improvements in function in a reasonable and predictable amount of time.   01/30/2022   1:15 PM Frequency and Duration  Speech Therapy Frequency (ACUTE ONLY) min 2x/week Treatment Duration 2 weeks     01/30/2022   1:15 PM Oral Phase Oral Phase Impaired Oral - Thin Cup Decreased bolus  cohesion Oral - Thin Straw Decreased bolus cohesion;Premature spillage;Lingual/palatal residue Oral - Puree WFL Oral - Regular Weak lingual manipulation;Lingual/palatal residue    01/30/2022   1:15 PM Pharyngeal Phase Pharyngeal Phase Impaired Pharyngeal- Nectar Cup Pharyngeal residue - pyriform Pharyngeal- Nectar Straw Pharyngeal residue - pyriform Pharyngeal- Thin Cup Pharyngeal residue - pyriform;Penetration/Aspiration during swallow;Penetration/Aspiration before swallow Pharyngeal Material enters airway, CONTACTS cords and not ejected out;Material enters airway, remains ABOVE vocal cords then ejected out;Material does not enter airway Pharyngeal- Thin Straw Delayed swallow initiation-pyriform sinuses;Penetration/Aspiration before swallow;Penetration/Apiration after swallow;Pharyngeal residue - pyriform;Trace aspiration Pharyngeal Material enters airway, passes BELOW cords then ejected out;Material enters airway, CONTACTS cords and not ejected out;Material does not enter airway     No data to display    Herbie Baltimore, MA CCC-SLP Acute Rehabilitation Services Secure Chat Preferred Office [phone] Lynann Beaver 01/30/2022, 1:48 PM                     IR PERCUTANEOUS ART THROMBECTOMY/INFUSION INTRACRANIAL INC DIAG ANGIO  Result Date: 01/30/2022 INDICATION: 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 EXAM: 1. EMERGENT LARGE VESSEL OCCLUSION THROMBOLYSIS (anterior CIRCULATION) COMPARISON:  None Available. MEDICATIONS: No antibiotic was  administered within 1 hour of the procedure. ANESTHESIA/SEDATION: General anesthesia. CONTRAST:  Omnipaque 300 150 mL. FLUOROSCOPY TIME:  Fluoroscopy Time: 39 minutes 12 seconds (1289 mGy). COMPLICATIONS: None immediate. TECHNIQUE: Following a full explanation of the procedure along with the potential associated complications, an informed witnessed consent was obtained. The risks of intracranial hemorrhage of 10%, worsening neurological deficit, ventilator  dependency, death and inability to revascularize were all reviewed in detail with the patient's spouse. The patient was then put under general anesthesia by the Department of Anesthesiology at [facility]. The left groin was prepped and draped in the usual sterile fashion. Thereafter using modified Seldinger technique, transfemoral access into the left common femoral artery was obtained without difficulty. Over a 0.035 inch guidewire an 8 French 25 cm Pinnacle sheath was inserted. Through this, and also over a 0.035 inch guidewire a combination of a wrist Simmons 2 support catheter inside of an 087 balloon guide catheter was advanced to the aortic region, and selectively positioned initially in the left common carotid artery and then advanced to the distal cervical left ICA over an 035 inch guidewire with support catheter. The guidewire was removed. Good aspiration obtained hub of the balloon guide catheter. Arteriogram was then performed proximally from the common carotid artery then the distal left internal carotid artery. FINDINGS: The left common carotid arteriogram demonstrates the left external carotid artery and its major branches to be widely patent. The left internal carotid artery at bulb to the cranial skull base demonstrates wide patency. Wide patency is noted of the petrous, the cavernous and the supraclinoid segments. Opacification of the left posterior cerebral artery via the left posterior communicating artery is noted. The left middle cerebral artery M1 segment is widely patent. The angiographic occlusion of the superior division mid M2 segment is noted. The left MCA inferior division opacifies into the capillary and venous phases. The left anterior cerebral artery opacifies into the capillary and venous phases. Transient cross-filling via the anterior communicating artery of the right anterior cerebral artery A2 segment and the A1 segment is evident. PROCEDURE: Over an Aristotle softip  micro guidewire with a J configuration, a combination of a 132 cm Zoom aspiration catheter inside of which was a 160 cm Phenom microcatheter was advanced to the proximal left middle cerebral artery. Using a torque device, access was obtained with the micro guidewire through the occluded superior division into the proximal M3 segment followed by the microcatheter. The guidewire was removed. Good aspiration was obtained from the hub of the microcatheter. A gentle contrast injection demonstrated slow antegrade flow of contrast distally. A 3 mm x 36 mm retrieval device was then advanced to the distal end of the microcatheter and deployed in the usual manner. At this time the Zoom aspiration catheter was advanced into the proximal superior division. With proximal flow arrest in the left internal carotid artery constant aspiration was applied at the hub of the Zoom aspiration catheter and the aspiration device 2 minutes of the balloon guide catheter with a 20 mL syringe. Combination of the retrieval, the catheter and the Zoom aspiration catheter was retrieved removed. Following reversal of flow arrest, control arteriogram with the balloon guide catheter in the left internal carotid artery demonstrated near complete opacification of the superior division with gradual improved flow into the M3 M4 segment of the superior division. Moderate spasm noted in the proximal superior division responded to 5 aliquotes of 25 mcg of nitroglycerin intra-arterially. Also noted was a small non flow limiting filling  defect in a mid parietal branch of the inferior division in the distal M3 M4 region which also gradually started clearing over a period of 20 minutes. Control arteriogram performed through the balloon guide catheter in the mid cervical left demonstrated near complete revascularization of the left MCA achieving a TICI 2C revascularization. A control arteriogram performed through the left common carotid artery demonstrated small  dissection of the distal left internal carotid artery at the skull base medially without any flow limitation. The balloon guide catheter was removed. Diagnostic catheter arteriogram was then performed of the right common carotid artery and the right vertebral artery. These demonstrated the right external carotid artery and its major branches to be widely patent. Internal carotid artery at the bulb to the cranial skull base demonstrates wide patency. The petrous, the cavernous and the supraclinoid segments demonstrate wide patency as do the right middle cerebral artery and the right anterior cerebral artery into the capillary and venous phases. The right vertebral artery demonstrates wide patency. More distally, patency is maintained of the basilar junction in the right posterior-inferior cerebellar artery. The basilar artery, the posterior cerebral arteries, the superior cerebellar arteries and the anterior-inferior cerebellar arteries demonstrate patency into the capillary and venous phases. Unopacified blood was noted in the basilar artery from the contralateral left vertebral artery. An 8 French Angio-Seal closure device demonstrates hemostasis at the left groin puncture site. Distal pulses remained palpable in both feet unchanged. A CT of the brain demonstrated no hemorrhagic complications. The patient was left intubated due to poor responsiveness following reversal of the anesthesia. Patient was then transferred to the neuro ICU post revascularization care. IMPRESSION: Status post endovascular revascularization of occluded division of the left middle cerebral artery achieving a TICI 2C revascularization of the left MCA distribution. Puncture to first pass approximately 36 mins. PLAN: Follow-up as per referring MD. Electronically Signed   By: Luanne Bras M.D.   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 EXAM: 1.  EMERGENT LARGE VESSEL OCCLUSION THROMBOLYSIS (anterior CIRCULATION) COMPARISON:  None Available. MEDICATIONS: No antibiotic was administered within 1 hour of the procedure. ANESTHESIA/SEDATION: General anesthesia. CONTRAST:  Omnipaque 300 150 mL. FLUOROSCOPY TIME:  Fluoroscopy Time: 39 minutes 12 seconds (1289 mGy). COMPLICATIONS: None immediate. TECHNIQUE: Following a full explanation of the procedure along with the potential associated complications, an informed witnessed consent was obtained. The risks of intracranial hemorrhage of 10%, worsening neurological deficit, ventilator dependency, death and inability to revascularize were all reviewed in detail with the patient's spouse. The patient was then put under general anesthesia by the Department of Anesthesiology at [facility]. The left groin was prepped and draped in the usual sterile fashion. Thereafter using modified Seldinger technique, transfemoral access into the left common femoral artery was obtained without difficulty. Over a 0.035 inch guidewire an 8 French 25 cm Pinnacle sheath was inserted. Through this, and also over a 0.035 inch guidewire a combination of a wrist Simmons 2 support catheter inside of an 087 balloon guide catheter was advanced to the aortic region, and selectively positioned initially in the left common carotid artery and then advanced to the distal cervical left ICA over an 035 inch guidewire with support catheter. The guidewire was removed. Good aspiration obtained hub of the balloon guide catheter. Arteriogram was then performed proximally from the common carotid artery then the distal left internal carotid artery. FINDINGS: The left common carotid arteriogram demonstrates  the left external carotid artery and its major branches to be widely patent. The left internal carotid artery at bulb to the cranial skull base demonstrates wide patency. Wide patency is noted of the petrous, the cavernous and the supraclinoid  segments. Opacification of the left posterior cerebral artery via the left posterior communicating artery is noted. The left middle cerebral artery M1 segment is widely patent. The angiographic occlusion of the superior division mid M2 segment is noted. The left MCA inferior division opacifies into the capillary and venous phases. The left anterior cerebral artery opacifies into the capillary and venous phases. Transient cross-filling via the anterior communicating artery of the right anterior cerebral artery A2 segment and the A1 segment is evident. PROCEDURE: Over an Aristotle softip micro guidewire with a J configuration, a combination of a 132 cm Zoom aspiration catheter inside of which was a 160 cm Phenom microcatheter was advanced to the proximal left middle cerebral artery. Using a torque device, access was obtained with the micro guidewire through the occluded superior division into the proximal M3 segment followed by the microcatheter. The guidewire was removed. Good aspiration was obtained from the hub of the microcatheter. A gentle contrast injection demonstrated slow antegrade flow of contrast distally. A 3 mm x 36 mm retrieval device was then advanced to the distal end of the microcatheter and deployed in the usual manner. At this time the Zoom aspiration catheter was advanced into the proximal superior division. With proximal flow arrest in the left internal carotid artery constant aspiration was applied at the hub of the Zoom aspiration catheter and the aspiration device 2 minutes of the balloon guide catheter with a 20 mL syringe. Combination of the retrieval, the catheter and the Zoom aspiration catheter was retrieved removed. Following reversal of flow arrest, control arteriogram with the balloon guide catheter in the left internal carotid artery demonstrated near complete opacification of the superior division with gradual improved flow into the M3 M4 segment of the superior division. Moderate  spasm noted in the proximal superior division responded to 5 aliquotes of 25 mcg of nitroglycerin intra-arterially. Also noted was a small non flow limiting filling defect in a mid parietal branch of the inferior division in the distal M3 M4 region which also gradually started clearing over a period of 20 minutes. Control arteriogram performed through the balloon guide catheter in the mid cervical left demonstrated near complete revascularization of the left MCA achieving a TICI 2C revascularization. A control arteriogram performed through the left common carotid artery demonstrated small dissection of the distal left internal carotid artery at the skull base medially without any flow limitation. The balloon guide catheter was removed. Diagnostic catheter arteriogram was then performed of the right common carotid artery and the right vertebral artery. These demonstrated the right external carotid artery and its major branches to be widely patent. Internal carotid artery at the bulb to the cranial skull base demonstrates wide patency. The petrous, the cavernous and the supraclinoid segments demonstrate wide patency as do the right middle cerebral artery and the right anterior cerebral artery into the capillary and venous phases. The right vertebral artery demonstrates wide patency. More distally, patency is maintained of the basilar junction in the right posterior-inferior cerebellar artery. The basilar artery, the posterior cerebral arteries, the superior cerebellar arteries and the anterior-inferior cerebellar arteries demonstrate patency into the capillary and venous phases. Unopacified blood was noted in the basilar artery from the contralateral left vertebral artery. An 8 French Angio-Seal closure device demonstrates  hemostasis at the left groin puncture site. Distal pulses remained palpable in both feet unchanged. A CT of the brain demonstrated no hemorrhagic complications. The patient was left intubated due to  poor responsiveness following reversal of the anesthesia. Patient was then transferred to the neuro ICU post revascularization care. IMPRESSION: Status post endovascular revascularization of occluded division of the left middle cerebral artery achieving a TICI 2C revascularization of the left MCA distribution. Puncture to first pass approximately 36 mins. PLAN: Follow-up as per referring MD. Electronically Signed   By: Luanne Bras M.D.   On: 01/30/2022 08:42   MR BRAIN WO CONTRAST  Result Date: 01/28/2022 CLINICAL DATA:  Right MCA stroke status post thrombectomy, follow-up EXAM: MRI HEAD WITHOUT CONTRAST MRA HEAD WITHOUT CONTRAST TECHNIQUE: Multiplanar, multi-echo pulse sequences of the brain and surrounding structures were acquired without intravenous contrast. Angiographic images of the Circle of Willis were acquired using MRA technique without intravenous contrast. COMPARISON:  01/22/2022 MRI head, no prior MRA, correlation is made with CTA head and neck 01/21/2022 FINDINGS: Brain: New foci of restricted diffusion with ADC correlate in the left posterior frontal and anterior parietal cortex (series 5, images 80 8-95). Redemonstrated focus of restricted diffusion with ADC correlate in the left insula (series 5, image 76 and series 6, image 26). Other previously noted foci of restricted diffusion are decreased in conspicuity and do not have persistent ADC correlates. No acute hemorrhage, mass, mass effect, or midline shift. No hydrocephalus or extra-axial collection. Punctate focus of hemosiderin deposition in the left posterior frontal lobe. Minimal T2 hyperintense signal in the periventricular [place] matter and pons, likely the sequela of mild chronic small vessel ischemic disease. Vascular: Normal arterial flow voids. Skull and upper cervical spine: Normal marrow signal. Sinuses/Orbits: Minimal mucosal thickening in the ethmoid air cells. The orbits are unremarkable. Other: The mastoids are well  aerated. MRA HEAD FINDINGS Anterior circulation: Both internal carotid arteries are patent to the termini, without significant stenosis. A1 segments patent. Normal anterior communicating artery. Anterior cerebral arteries are patent to their distal aspects. No M1 stenosis or occlusion. Distal MCA branches perfused and symmetric.In particular, the left MCA branches appear perfused and without focal stenosis. Posterior circulation: Vertebral arteries patent to the vertebrobasilar junction without stenosis. Basilar patent to its distal aspect. Superior cerebellar arteries patent bilaterally. Patent P1 segments. PCAs perfused to their distal aspects without stenosis. Diminutive left posterior communicating artery. The right posterior communicating artery is not definitively visualized. Anatomic variants: None significant IMPRESSION: 1. 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 2. No intracranial large vessel occlusion or significant stenosis. In particular, left MCA branches appear perfused and without focal stenosis. These results will be called to the ordering clinician or representative by the Radiologist Assistant, and communication documented in the PACS or [employer]. Electronically Signed   By: Merilyn Baba M.D.   On: 01/28/2022 23:40   MR ANGIO HEAD WO CONTRAST  Result Date: 01/28/2022 CLINICAL DATA:  Right MCA stroke status post thrombectomy, follow-up EXAM: MRI HEAD WITHOUT CONTRAST MRA HEAD WITHOUT CONTRAST TECHNIQUE: Multiplanar, multi-echo pulse sequences of the brain and surrounding structures were acquired without intravenous contrast. Angiographic images of the Circle of Willis were acquired using MRA technique without intravenous contrast. COMPARISON:  01/22/2022 MRI head, no prior MRA, correlation is made with CTA head and neck 01/21/2022 FINDINGS: Brain: New foci of restricted diffusion with ADC correlate in the left posterior frontal and anterior  parietal cortex (series 5, images 80  8-95). Redemonstrated focus of restricted diffusion with ADC correlate in the left insula (series 5, image 76 and series 6, image 26). Other previously noted foci of restricted diffusion are decreased in conspicuity and do not have persistent ADC correlates. No acute hemorrhage, mass, mass effect, or midline shift. No hydrocephalus or extra-axial collection. Punctate focus of hemosiderin deposition in the left posterior frontal lobe. Minimal T2 hyperintense signal in the periventricular [place] matter and pons, likely the sequela of mild chronic small vessel ischemic disease. Vascular: Normal arterial flow voids. Skull and upper cervical spine: Normal marrow signal. Sinuses/Orbits: Minimal mucosal thickening in the ethmoid air cells. The orbits are unremarkable. Other: The mastoids are well aerated. MRA HEAD FINDINGS Anterior circulation: Both internal carotid arteries are patent to the termini, without significant stenosis. A1 segments patent. Normal anterior communicating artery. Anterior cerebral arteries are patent to their distal aspects. No M1 stenosis or occlusion. Distal MCA branches perfused and symmetric.In particular, the left MCA branches appear perfused and without focal stenosis. Posterior circulation: Vertebral arteries patent to the vertebrobasilar junction without stenosis. Basilar patent to its distal aspect. Superior cerebellar arteries patent bilaterally. Patent P1 segments. PCAs perfused to their distal aspects without stenosis. Diminutive left posterior communicating artery. The right posterior communicating artery is not definitively visualized. Anatomic variants: None significant IMPRESSION: 1. 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 2. No intracranial large vessel occlusion or significant stenosis. In particular, left MCA branches appear perfused and without focal stenosis. These results will be called to the ordering  clinician or representative by the Radiologist Assistant, and communication documented in the PACS or [employer]. Electronically Signed   By: Merilyn Baba M.D.   On: 01/28/2022 23:40   [place] EKG SITE RITE  Result Date: 01/28/2022 If Site Rite image not attached, placement could not be confirmed due to current cardiac rhythm.  DG Abd 1 View  Result Date: 01/28/2022 CLINICAL DATA:  Evaluate OG tube placement EXAM: ABDOMEN - 1 VIEW COMPARISON:  None Available. FINDINGS: The OG tube terminates in the left upper quadrant, in the region of the stomach. IMPRESSION: The OG tube appears to terminate in the stomach. No other abnormalities. Electronically Signed   By: Dorise Bullion III M.D.   On: 01/28/2022 17:28   Portable Chest x-ray  Result Date: 01/28/2022 CLINICAL DATA:  [id number]. Encounter for ETT placement. EXAM: PORTABLE CHEST 1 VIEW COMPARISON:  Chest x-ray 01/26/2022, CT heart 01/23/2022 FINDINGS: Enteric tube with tip terminating 3 cm above the carina. Slightly more prominent aortic arch and aortic knob likely due to positioning and AP portable technique. Otherwise the heart and mediastinal contours are unchanged normal limits. No focal consolidation. No pulmonary edema. No pleural effusion. No pneumothorax. No acute osseous abnormality. IMPRESSION: 1. Slightly more prominent aortic arch and aortic knob likely due to positioning and AP portable technique. Finding can likely be further evaluated on CT angio head and neck 01/28/2022. 2. No active disease. Electronically Signed   By: Iven Finn M.D.   On: 01/28/2022 17:21   CT HEAD CODE STROKE WO CONTRAST  Result Date: 01/28/2022 CLINICAL DATA:  Code stroke.  Neuro deficit, acute, stroke suspected EXAM: CT HEAD WITHOUT CONTRAST TECHNIQUE: Contiguous axial images were obtained from the base of the skull through the vertex without intravenous contrast. RADIATION DOSE REDUCTION: This exam was performed according to the departmental dose-optimization  program which includes automated exposure control, adjustment of the mA and/or kV according to patient size and/or use  of iterative reconstruction technique. COMPARISON:  CT head 01/21/2022. FINDINGS: Brain: No evidence of acute large vascular territory infarction, hemorrhage, hydrocephalus, extra-axial collection or mass lesion/mass effect. Vascular: No hyperdense vessel identified. Skull: No acute fracture. Sinuses/Orbits: Clear sinuses.  No acute orbital findings. Other: No mastoid effusions. ASPECTS [facility] Stroke Program Early CT Score) total score (0-10 with 10 being normal): 10. IMPRESSION: 1. No evidence of acute intracranial abnormality. 2. ASPECTS is 10. Code stroke imaging results were communicated on 01/28/2022 at 1:43 pm to provider Bhagat via secure text paging. Electronically Signed   By: Margaretha Sheffield M.D.   On: 01/28/2022 13:43   DG Chest 2 View  Result Date: 01/26/2022 CLINICAL DATA:  Chest and shoulder pain.  Recent MRI and CVA. EXAM: CHEST - 2 VIEW COMPARISON:  11/21/2021 FINDINGS: The lungs are clear without focal pneumonia, edema, pneumothorax or pleural effusion. The cardiopericardial silhouette is within normal limits for size. The visualized bony structures of the thorax are unremarkable. IMPRESSION: No active cardiopulmonary disease. Electronically Signed   By: Misty Stanley M.D.   On: 01/26/2022 12:03   CT CORONARY MORPH W/CTA COR W/SCORE W/CA W/CM &/OR WO/CM  Addendum Date: 01/23/2022   ADDENDUM REPORT: 01/23/2022 13:25 EXAM: OVER-READ INTERPRETATION  CT CHEST The following report is a limited chest CT over-read performed by radiologist Dr. Lindaann [facility] on 01/23/2022. This over-read does not include interpretation of cardiac or coronary anatomy or pathology. The coronary CTA interpretation by the cardiologist is attached. COMPARISON:  Radiographs 11/21/2021. Coronary artery calcium score CT 10/04/2020 FINDINGS: Mediastinum/Nodes: No enlarged lymph  nodes within the visualized mediastinum. Lungs/Pleura: There is no pleural effusion. The visualized lungs appear clear. Upper abdomen: No significant findings within the visualized upper abdomen. Musculoskeletal/Chest wall: No chest wall mass or suspicious osseous findings within the visualized chest. IMPRESSION: No significant extracardiac findings within the visualized chest. Electronically Signed   By: Richardean Sale M.D.   On: 01/23/2022 13:25   Result Date: 01/23/2022 HISTORY: Chest pain/anginal equiv, ECGs or troponins abnormal EXAM: Cardiac/Coronary CT TECHNIQUE: The patient was scanned on a [employer]. PROTOCOL: A 100 kV prospective scan was triggered in the descending thoracic aorta at 111 HU's. Axial non-contrast 3 mm slices were carried out through the heart. The data set was analyzed on a dedicated work station and scored using the Agatston method. Gantry rotation speed was 250 msecs and collimation was .6 mm. Heart rate was optimized medically and sl NTG was given. The 3D data set was reconstructed in 5% intervals of the 35-75 % of the R-R cycle. Systolic and diastolic phases were analyzed on a dedicated work station using MPR, MIP and VRT modes. The patient received contrast. FINDINGS: Coronary calcium score: The patient's coronary artery calcium score is 0, which places the patient in the 0 percentile. Coronary arteries: Normal coronary origins.  Right dominance. Right Coronary Artery: Normal caliber vessel, gives rise to small PDA. No significant plaque or stenosis. Left Main Coronary Artery: Normal caliber vessel. No significant plaque or stenosis. Small caliber ramus without significant plaque or stenosis. Left Anterior Descending Coronary Artery: Normal caliber vessel. Minimal noncalcified plaque with 1-24% stenosis. Gives rise to large first diagonal branch without significant stenosis. Distal LAD wraps apex Left Circumflex Artery: Normal caliber vessel. Very trivial noncalcified  plaque without significant stenosis. Gives rise to large first and second, small third OM branches. Aorta: Normal size, 31 mm at the mid ascending aorta (level of the PA bifurcation) measured double oblique. Trivial  aortic atherosclerosis. No dissection seen in visualized portions of the aorta. Aortic Valve: No calcifications. Trileaflet. Other findings: Normal pulmonary vein drainage into the left atrium. Normal left atrial appendage without a thrombus. Appendage is chicken wing type. Normal size of the pulmonary artery. Normal appearance of the pericardium. There is an apical LV thrombus, dimensions saves on PACS images. Maximum dimension 15.6 x 11.3 mm There is a small accessory left atrial segment on the cranial aspect of the left atrium. IMPRESSION: 1.  Very minimal nonobstructive CAD, CADRADS = 1. 2. Coronary calcium score of 0. This was 0 percentile for age and sex matched control. 3. Normal coronary origin with right dominance. 4. There is an apical LV thrombus, dimensions saves on PACS images. Maximum dimension 15.6 x 11.3 mm Findings reported to ordering provider. INTERPRETATION: CAD-RADS 1: Minimal non-obstructive CAD (0-24%). Consider non-atherosclerotic causes of chest pain. Consider preventive therapy and risk factor modification. Electronically Signed: By: Buford Dresser M.D. 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 She initially presented on 01/21/2022 with transient episode of aphasia followed sudden onset dizziness, nausea, vomiting, chest pain that radiated to upper back on Saturday. MRI brain showed small ischemic  infarct of the frontal [place] matter on the left. Her troponins were elevated to over 300 and her echocardiogram showed apical hypokinesis and a left ventricular thrombus consistent with NSTEMI. Around 6 p.m. on 01/21/2022, patient developed worsening aphasia, NIHSS 6. Head CT was unremarkable. CT angiogram of the head and neck showed a left M2/M3-MCA occlusion. She was then transferred to our service for an emergency mechanical thrombectomy. EXAM: ULTRASOUND-GUIDED VASCULAR ACCESS DIAGNOSTIC CEREBRAL ANGIOGRAM MECHANICAL THROMBECTOMY FLAT PANEL HEAD CT COMPARISON:  CT/CT angiogram of the head and neck January 22, 2019 MEDICATIONS: No antibiotic was administered. ANESTHESIA/SEDATION: The procedure was performed under general anesthesia. CONTRAST:  75 mL of Omnipaque 300 milligram/mL. FLUOROSCOPY: Radiation Exposure Index (as provided by the fluoroscopic device): 595.6 mGy Kerma COMPLICATIONS: None immediate. TECHNIQUE: Informed written consent was obtained from the patient after a thorough discussion of the procedural risks, benefits and alternatives. All questions were addressed. Maximal Sterile Barrier Technique was utilized including caps, mask, sterile gowns, sterile gloves, sterile drape, hand hygiene and skin antiseptic. A timeout was performed prior to the initiation of the procedure. The right groin was prepped and draped in the usual sterile fashion. Using a micropuncture kit and the modified Seldinger technique, access was gained to the right common femoral artery and an 8 French sheath was placed. Real-time ultrasound guidance was utilized for vascular access including the acquisition of a permanent ultrasound image documenting patency of the accessed vessel. Under fluoroscopy, a Zoom 88 guide catheter was navigated over a 6 [place] VTK catheter and a 0.035" Terumo Glidewire into the aortic arch. The catheter was placed into the left common carotid artery and then advanced into the left internal carotid  artery. The diagnostic catheter was removed. Frontal and lateral angiograms of the head were obtained. FINDINGS: 1. Normal caliber of the right common femoral artery, adequate for vascular access. 2. There is no occlusion of the distal left M2/MCA superior division branch extending to the M3 segment. PROCEDURE: Using biplane roadmap, a Vect 46 aspiration catheter was navigated over an Aristotle 24 microguidewire into the  cavernous segment of the left ICA. The aspiration catheter was then advanced over the wire to the level of occlusion in the M2-M3 segment and connected to an aspiration pump. Continuous aspiration was performed for 2 minutes. The guide catheter was connected to a VacLok syringe. The aspiration catheter was subsequently removed under constant aspiration. The guide catheter was aspirated for debris. Left internal carotid artery angiograms with magnified frontal and lateral views of the head showed recanalization of the left MCA with slow flow in a few distal cortical branches (TICI 2C). No embolus to new territory. Flat panel CT of the head was obtained and post processed in a separate workstation with concurrent attending physician supervision. Selected images were sent to PACS. No evidence of hemorrhagic complication. Delayed left internal carotid artery angiogram showed persistent patency of the left MCA. Right common femoral artery angiogram was obtained in right anterior oblique view. The puncture is at the level of the common femoral artery. The artery has normal caliber, adequate for closure device. The sheath was exchanged over the wire for a Perclose prostyle which was utilized for access closure. Immediate hemostasis was achieved. IMPRESSION: 1. Successful mechanical thrombectomy for treatment of a distal left M2/MCA occlusion with direct contact aspiration achieving complete recanalization (TICI 2C). 2. No thromboembolic or hemorrhagic complication. PLAN: Transfer to ICU for continued post  stroke care. Electronically Signed   By: Pedro Earls M.D.   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 She initially presented on 01/21/2022 with transient episode of aphasia followed sudden onset dizziness, nausea, vomiting, chest pain that radiated to upper back on Saturday. MRI brain showed small ischemic infarct of the frontal [place] matter on the left. Her troponins were elevated to over 300 and her echocardiogram showed apical hypokinesis and a left ventricular thrombus consistent with NSTEMI. Around 6 p.m. on 01/21/2022, patient developed worsening aphasia, NIHSS 6. Head CT was unremarkable. CT angiogram of the head and neck showed a left M2/M3-MCA occlusion. She was then transferred to our service for an emergency mechanical thrombectomy. EXAM: ULTRASOUND-GUIDED VASCULAR ACCESS DIAGNOSTIC CEREBRAL ANGIOGRAM MECHANICAL THROMBECTOMY FLAT PANEL HEAD CT COMPARISON:  CT/CT angiogram of the head and neck January 22, 2019 MEDICATIONS: No antibiotic was administered. ANESTHESIA/SEDATION: The procedure was performed under general anesthesia. CONTRAST:  75 mL of Omnipaque 300 milligram/mL. FLUOROSCOPY: Radiation Exposure Index (as provided by the fluoroscopic device): 932.6 mGy Kerma COMPLICATIONS: None immediate. TECHNIQUE: Informed written consent was obtained from the patient after a thorough discussion of the procedural risks, benefits and alternatives. All questions were addressed. Maximal Sterile Barrier Technique was utilized including caps, mask, sterile gowns, sterile gloves, sterile drape, hand hygiene and skin antiseptic. A timeout was performed prior to the initiation of the  procedure. The right groin was prepped and draped in the usual sterile fashion. Using a micropuncture kit and the modified Seldinger technique, access was gained to the right common femoral artery and an 8 French sheath was placed. Real-time ultrasound guidance was utilized for vascular access including the acquisition of a permanent ultrasound image documenting patency of the accessed vessel. Under fluoroscopy, a Zoom 88 guide catheter was navigated over a 6 [place] VTK catheter and a 0.035" Terumo Glidewire into the aortic arch. The catheter was placed into the left  common carotid artery and then advanced into the left internal carotid artery. The diagnostic catheter was removed. Frontal and lateral angiograms of the head were obtained. FINDINGS: 1. Normal caliber of the right common femoral artery, adequate for vascular access. 2. There is no occlusion of the distal left M2/MCA superior division branch extending to the M3 segment. PROCEDURE: Using biplane roadmap, a Vect 46 aspiration catheter was navigated over an Aristotle 24 microguidewire into the cavernous segment of the left ICA. The aspiration catheter was then advanced over the wire to the level of occlusion in the M2-M3 segment and connected to an aspiration pump. Continuous aspiration was performed for 2 minutes. The guide catheter was connected to a VacLok syringe. The aspiration catheter was subsequently removed under constant aspiration. The guide catheter was aspirated for debris. Left internal carotid artery angiograms with magnified frontal and lateral views of the head showed recanalization of the left MCA with slow flow in a few distal cortical branches (TICI 2C). No embolus to new territory. Flat panel CT of the head was obtained and post processed in a separate workstation with concurrent attending physician supervision. Selected images were sent to PACS. No evidence of hemorrhagic complication. Delayed left internal carotid artery angiogram  showed persistent patency of the left MCA. Right common femoral artery angiogram was obtained in right anterior oblique view. The puncture is at the level of the common femoral artery. The artery has normal caliber, adequate for closure device. The sheath was exchanged over the wire for a Perclose prostyle which was utilized for access closure. Immediate hemostasis was achieved. IMPRESSION: 1. Successful mechanical thrombectomy for treatment of a distal left M2/MCA occlusion with direct contact aspiration achieving complete recanalization (TICI 2C). 2. No thromboembolic or hemorrhagic complication. PLAN: Transfer to ICU for continued post stroke care. Electronically Signed   By: Pedro Earls M.D.   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 She initially presented on 01/21/2022 with transient episode of aphasia followed sudden onset dizziness, nausea, vomiting, chest pain that radiated to upper back on Saturday. MRI brain showed small ischemic infarct of the frontal [place] matter on the left. Her troponins were elevated to over 300 and her echocardiogram showed apical hypokinesis and a left ventricular thrombus consistent with NSTEMI. Around 6 p.m. on 01/21/2022, patient developed worsening aphasia, NIHSS 6. Head CT was unremarkable. CT angiogram of the head and neck showed a left M2/M3-MCA occlusion. She was then transferred to our service for an emergency mechanical thrombectomy. EXAM: ULTRASOUND-GUIDED VASCULAR ACCESS DIAGNOSTIC CEREBRAL ANGIOGRAM MECHANICAL THROMBECTOMY FLAT PANEL HEAD CT COMPARISON:  CT/CT angiogram of the head and neck January 22, 2019 MEDICATIONS: No  antibiotic was administered. ANESTHESIA/SEDATION: The procedure was performed under general anesthesia. CONTRAST:  75 mL of Omnipaque 300 milligram/mL. FLUOROSCOPY: Radiation Exposure Index (as provided by the fluoroscopic device): 765.4 mGy Kerma COMPLICATIONS: None immediate. TECHNIQUE: Informed written consent was obtained from the patient after a thorough discussion of the procedural risks, benefits and alternatives. All questions were addressed. Maximal Sterile Barrier Technique was utilized including caps, mask, sterile gowns, sterile gloves, sterile drape, hand hygiene and skin antiseptic. A timeout was performed prior to the initiation of the procedure. The right groin was prepped and draped in the usual sterile fashion.  Using a micropuncture kit and the modified Seldinger technique, access was gained to the right common femoral artery and an 8 French sheath was placed. Real-time ultrasound guidance was utilized for vascular access including the acquisition of a permanent ultrasound image documenting patency of the accessed vessel. Under fluoroscopy, a Zoom 88 guide catheter was navigated over a 6 [place] VTK catheter and a 0.035" Terumo Glidewire into the aortic arch. The catheter was placed into the left common carotid artery and then advanced into the left internal carotid artery. The diagnostic catheter was removed. Frontal and lateral angiograms of the head were obtained. FINDINGS: 1. Normal caliber of the right common femoral artery, adequate for vascular access. 2. There is no occlusion of the distal left M2/MCA superior division branch extending to the M3 segment. PROCEDURE: Using biplane roadmap, a Vect 46 aspiration catheter was navigated over an Aristotle 24 microguidewire into the cavernous segment of the left ICA. The aspiration catheter was then advanced over the wire to the level of occlusion in the M2-M3 segment and connected to an aspiration pump. Continuous aspiration was performed for 2  minutes. The guide catheter was connected to a VacLok syringe. The aspiration catheter was subsequently removed under constant aspiration. The guide catheter was aspirated for debris. Left internal carotid artery angiograms with magnified frontal and lateral views of the head showed recanalization of the left MCA with slow flow in a few distal cortical branches (TICI 2C). No embolus to new territory. Flat panel CT of the head was obtained and post processed in a separate workstation with concurrent attending physician supervision. Selected images were sent to PACS. No evidence of hemorrhagic complication. Delayed left internal carotid artery angiogram showed persistent patency of the left MCA. Right common femoral artery angiogram was obtained in right anterior oblique view. The puncture is at the level of the common femoral artery. The artery has normal caliber, adequate for closure device. The sheath was exchanged over the wire for a Perclose prostyle which was utilized for access closure. Immediate hemostasis was achieved. IMPRESSION: 1. Successful mechanical thrombectomy for treatment of a distal left M2/MCA occlusion with direct contact aspiration achieving complete recanalization (TICI 2C). 2. No thromboembolic or hemorrhagic complication. PLAN: Transfer to ICU for continued post stroke care. Electronically Signed   By: Pedro Earls M.D.   On: 01/22/2022 14:25   MR BRAIN WO CONTRAST  Result Date: 01/22/2022 CLINICAL DATA:  Follow-up stroke EXAM: MRI HEAD WITHOUT CONTRAST TECHNIQUE: Multiplanar, multiecho pulse sequences of the brain and surrounding structures were obtained without intravenous contrast. COMPARISON:  Brain MRI and CT/CTA head and neck 1 day prior FINDINGS: Brain: A punctate fusion of diffusion restriction in the left frontal lobe periventricular Winnie matter is unchanged compared to the study from 1 day prior. Additional small foci of diffusion restriction in the left  external capsule, insula, and frontal lobe cortex are new. There is no associated hemorrhage or mass effect. Parenchymal volume is normal. The ventricles are normal in size. Parenchymal signal is otherwise normal, with no significant burden of under vitamin chronic small vessel ischemic change. There is no mass lesion.  There is no mass effect or midline shift. Vascular: Normal flow voids. Skull and upper cervical spine: Normal marrow signal. Sinuses/Orbits: The paranasal sinuses are clear. The globes and orbits are unremarkable. Other: None. IMPRESSION: New punctate acute infarcts in the left external capsule, insula, and frontal lobe cortex, and unchanged additional punctate infarct in the left frontal lobe Bro[place] matter. Electronically Signed  By: Valetta Mole M.D.   On: 01/22/2022 11:39   CT CEREBRAL PERFUSION W CONTRAST  Result Date: 01/21/2022 CLINICAL DATA:  Neuro deficit, acute, stroke suspected. EXAM: CT ANGIOGRAPHY HEAD AND NECK CT PERFUSION BRAIN TECHNIQUE: Multidetector CT imaging of the head and neck was performed using the standard protocol during bolus administration of intravenous contrast. Multiplanar CT image reconstructions and MIPs were obtained to evaluate the vascular anatomy. Carotid stenosis measurements (when applicable) are obtained utilizing NASCET criteria, using the distal internal carotid diameter as the denominator. Multiphase CT imaging of the brain was performed following IV bolus contrast injection. Subsequent parametric perfusion maps were calculated using RAPID software. RADIATION DOSE REDUCTION: This exam was performed according to the departmental dose-optimization program which includes automated exposure control, adjustment of the mA and/or kV according to patient size and/or use of iterative reconstruction technique. CONTRAST:  173m OMNIPAQUE IOHEXOL 350 MG/ML SOLN COMPARISON:  Brain MRI 01/21/2022. Noncontrast head CT performed earlier today 01/21/2022. FINDINGS: CTA  NECK FINDINGS Aortic arch: Standard aortic branching. Mild atherosclerotic plaque within the visualized aortic arch and proximal major branch vessels of the neck. Streak and beam hardening artifact arising from a dense right-sided contrast bolus partially obscures the right subclavian artery. Within this limitation, there is no appreciable hemodynamically significant innominate or proximal subclavian artery stenosis. Right carotid system: CCA and ICA patent within the neck without stenosis. Mild atherosclerotic plaque about the carotid bifurcation. Left carotid system: CCA and ICA patent within the neck without stenosis. Minimal atherosclerotic plaque about the carotid bifurcation. Vertebral arteries: Vertebral arteries codominant and patent within the neck without stenosis. Nonstenotic atherosclerotic plaque at the origin of the right vertebral artery. Skeleton: Cervical spondylosis. No acute fracture or aggressive osseous lesion. Other neck: No neck mass or cervical lymphadenopathy. Upper chest: No consolidation within the imaged lung apices. Review of the MIP images confirms the above findings CTA HEAD FINDINGS Anterior circulation: The intracranial internal carotid arteries are patent. The M1 middle cerebral arteries are patent. Occluded mid M2 left MCA vessel (with distal reconstitution, likely due to collateral flow) (for instance as seen on series 15, image 15). The anterior cerebral arteries are patent. No intracranial aneurysm is identified. Posterior circulation: The intracranial vertebral arteries are patent. The basilar artery is patent. The posterior cerebral arteries are patent. Posterior communicating arteries are diminutive or absent, bilaterally. Venous sinuses: Within the limitations of contrast timing, no convincing thrombus. Anatomic variants: As described. Review of the MIP images confirms the above findings CT Brain Perfusion Findings: CBF (<30%) Volume: 013mPerfusion (Tmax>6.0s) volume: 3m54mMismatch Volume: 0mL53mfarction Location:None identified CTA head impression and CT perfusion head impression called by telephone at the time of interpretation on 01/21/2022 at 6:50 pm to provider [facility] these results. IMPRESSION: CTA neck: 1. The common carotid, internal carotid and vertebral arteries are patent within the neck without stenosis. Minimal non-stenotic atherosclerotic plaque about the carotid bifurcations and at the origin of the right vertebral artery. 2.  Aortic Atherosclerosis (ICD10-I70.0). CTA head: Occluded mid M2 left MCA vessel (with distal reconstitution, likely due to collateral flow). CT perfusion head: The perfusion software identifies a 7 mL region of critically hypoperfused parenchyma within the left insula/frontal lobe (utilizing the Tmax>6 seconds threshold). No core infarct is identified. Reported mismatch volume: 7 mL. Electronically Signed   By: KyleKellie Simmering.   On: 01/21/2022 19:09   CT ANGIO HEAD NECK W WO CM (CODE STROKE)  Result Date: 01/21/2022 CLINICAL DATA:  Neuro deficit, acute, stroke suspected. EXAM: CT ANGIOGRAPHY HEAD AND NECK CT PERFUSION BRAIN TECHNIQUE: Multidetector CT imaging of the head and neck was performed using the standard protocol during bolus administration of intravenous contrast. Multiplanar CT image reconstructions and MIPs were obtained to evaluate the vascular anatomy. Carotid stenosis measurements (when applicable) are obtained utilizing NASCET criteria, using the distal internal carotid diameter as the denominator. Multiphase CT imaging of the brain was performed following IV bolus contrast injection. Subsequent parametric perfusion maps were calculated using RAPID software. RADIATION DOSE REDUCTION: This exam was performed according to the departmental dose-optimization program which includes automated exposure control, adjustment of the mA and/or kV according to patient size and/or use of iterative  reconstruction technique. CONTRAST:  123mL OMNIPAQUE IOHEXOL 350 MG/ML SOLN COMPARISON:  Brain MRI 01/21/2022. Noncontrast head CT performed earlier today 01/21/2022. FINDINGS: CTA NECK FINDINGS Aortic arch: Standard aortic branching. Mild atherosclerotic plaque within the visualized aortic arch and proximal major branch vessels of the neck. Streak and beam hardening artifact arising from a dense right-sided contrast bolus partially obscures the right subclavian artery. Within this limitation, there is no appreciable hemodynamically significant innominate or proximal subclavian artery stenosis. Right carotid system: CCA and ICA patent within the neck without stenosis. Mild atherosclerotic plaque about the carotid bifurcation. Left carotid system: CCA and ICA patent within the neck without stenosis. Minimal atherosclerotic plaque about the carotid bifurcation. Vertebral arteries: Vertebral arteries codominant and patent within the neck without stenosis. Nonstenotic atherosclerotic plaque at the origin of the right vertebral artery. Skeleton: Cervical spondylosis. No acute fracture or aggressive osseous lesion. Other neck: No neck mass or cervical lymphadenopathy. Upper chest: No consolidation within the imaged lung apices. Review of the MIP images confirms the above findings CTA HEAD FINDINGS Anterior circulation: The intracranial internal carotid arteries are patent. The M1 middle cerebral arteries are patent. Occluded mid M2 left MCA vessel (with distal reconstitution, likely due to collateral flow) (for instance as seen on series 15, image 15). The anterior cerebral arteries are patent. No intracranial aneurysm is identified. Posterior circulation: The intracranial vertebral arteries are patent. The basilar artery is patent. The posterior cerebral arteries are patent. Posterior communicating arteries are diminutive or absent, bilaterally. Venous sinuses: Within the limitations of contrast timing, no convincing  thrombus. Anatomic variants: As described. Review of the MIP images confirms the above findings CT Brain Perfusion Findings: CBF (<30%) Volume: 71mL Perfusion (Tmax>6.0s) volume: 72mL Mismatch Volume: 67mL Infarction Location:None identified CTA head impression and CT perfusion head impression called by telephone at the time of interpretation on 01/21/2022 at 6:50 pm to provider MCNEILL [facility] , who verbally acknowledged these results. IMPRESSION: CTA neck: 1. The common carotid, internal carotid and vertebral arteries are patent within the neck without stenosis. Minimal non-stenotic atherosclerotic plaque about the carotid bifurcations and at the origin of the right vertebral artery. 2.  Aortic Atherosclerosis (ICD10-I70.0). CTA head: Occluded mid M2 left MCA vessel (with distal reconstitution, likely due to collateral flow). CT perfusion head: The perfusion software identifies a 7 mL region of critically hypoperfused parenchyma within the left insula/frontal lobe (utilizing the Tmax>6 seconds threshold). No core infarct is identified. Reported mismatch volume: 7 mL. Electronically Signed   By: Kellie Simmering D.O.   On: 01/21/2022 19:09   CT HEAD CODE STROKE WO CONTRAST  Result Date: 01/21/2022 CLINICAL DATA:  Code stroke. Neuro deficit, acute, stroke suspected. EXAM: CT HEAD WITHOUT CONTRAST TECHNIQUE: Contiguous axial images were obtained from the base of the skull through the  vertex without intravenous contrast. RADIATION DOSE REDUCTION: This exam was performed according to the departmental dose-optimization program which includes automated exposure control, adjustment of the mA and/or kV according to patient size and/or use of iterative reconstruction technique. COMPARISON:  Same-day brain MRI 01/21/2022. FINDINGS: Brain: No age advanced or lobar predominant parenchymal atrophy. A subcentimeter acute infarct within the left frontal lobe Cofer matter (adjacent to the left lateral ventricle frontal horn) is  occult by CT and was better appreciated on the brain MRI performed earlier today. No CT evidence of interval acute infarct. Minimal chronic small-vessel image changes within the cerebral [place] matter and pons, better appreciated on the prior brain MRI. There is no acute intracranial hemorrhage. No extra-axial fluid collection. No evidence of an intracranial mass. No midline shift. Vascular: No hyperdense vessel. Skull: No fracture or aggressive osseous lesion. Sinuses/Orbits: No mass or acute finding within the imaged orbits. Trace mucosal thickening within the anterior left ethmoid air cells. ASPECTS ([place] Stroke Program Early CT Score) - Ganglionic level infarction (caudate, lentiform nuclei, internal capsule, insula, M1-M3 cortex): 7 - Supraganglionic infarction (M4-M6 cortex): 3 Total score (0-10 with 10 being normal): 10 These results were communicated to Dr. Leonel Ramsay at 6:27 pmon 8/29/2023by text page via the [facility] messaging system. IMPRESSION: A known subcentimeter acute infarct within the left frontal lobe Shaheed matter is occult by CT, and was better appreciated on the brain MRI performed earlier today. No CT evidence of interval acute intracranial abnormality. Minimal chronic small-vessel ischemic changes within the cerebral Landin matter and pons. Electronically Signed   By: Kellie Simmering D.O.   On: 01/21/2022 18:27   ECHOCARDIOGRAM COMPLETE  Result Date: 01/21/2022    ECHOCARDIOGRAM REPORT   Patient Name:   Chloe Johnson Date of Exam: 01/21/2022 Medical Rec #:  [id number]    Height:       61.0 in Accession #:    [id number]   Weight:       171.0 lb Date of Birth:  Dec 27, 1955    BSA:          1.767 m Patient Age:    34 years     BP:           146/125 mmHg Patient Gender: F            HR:           72 bpm. Exam Location:  Inpatient Procedure: 2D Echo, Color Doppler and Cardiac Doppler Indications:    Stroke i63.9  History:        Patient has no prior history of Echocardiogram examinations.                  Risk Factors:Hypertension.  Sonographer:    Raquel Sarna Senior RDCS Referring Phys: [place]  1. Left ventricular ejection fraction, by estimation, is 45 to 50%. The left ventricle has mildly decreased function. The left ventricle demonstrates regional wall motion abnormalities (apical hypokinesis with out true aneurysm and with presence of an LV thrombus 1.84 X 0.9 X 1.2 cm).  2. The mitral valve is grossly normal. No evidence of mitral valve regurgitation. No evidence of mitral stenosis.  3. The aortic valve is tricuspid. Aortic valve regurgitation is not visualized. No aortic stenosis is present.  4. Right ventricular systolic function is normal. The right ventricular size is normal. Tricuspid regurgitation signal is inadequate for assessing PA pressure.  5. The inferior vena cava is normal in size with greater than 50% respiratory  variability, suggesting right atrial pressure of 3 mmHg. Comparison(s): No prior Echocardiogram. 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 Cardiology aware. FINDINGS  Left Ventricle: Left ventricular ejection fraction, by estimation, is 45 to 50%. The left ventricle has mildly decreased function. The left ventricle demonstrates regional wall motion abnormalities. The left ventricular internal cavity size was small. There is no left ventricular hypertrophy. Left ventricular diastolic parameters are consistent with Grade I diastolic dysfunction (impaired relaxation).  LV Wall Scoring: The apical lateral segment, apical septal segment, apical anterior segment, and apical inferior segment are hypokinetic. Right Ventricle: The right ventricular size is normal. No increase in right ventricular wall thickness. Right ventricular systolic function is normal. Tricuspid regurgitation signal is inadequate for assessing PA pressure. Left Atrium: Left atrial size was normal in size. Right Atrium: Right atrial size was normal in size.  Pericardium: There is no evidence of pericardial effusion. Mitral Valve: The mitral valve is grossly normal. No evidence of mitral valve regurgitation. No evidence of mitral valve stenosis. Tricuspid Valve: The tricuspid valve is normal in structure. Tricuspid valve regurgitation is not demonstrated. No evidence of tricuspid stenosis. Aortic Valve: The aortic valve is tricuspid. Aortic valve regurgitation is not visualized. No aortic stenosis is present. Pulmonic Valve: The pulmonic valve was normal in structure. Pulmonic valve regurgitation is not visualized. No evidence of pulmonic stenosis. Aorta: The aortic root and ascending aorta are structurally normal, with no evidence of dilitation. Venous: The inferior vena cava is normal in size with greater than 50% respiratory variability, suggesting right atrial pressure of 3 mmHg. IAS/Shunts: No atrial level shunt detected by color flow Doppler.  LEFT VENTRICLE PLAX 2D LVIDd:         3.60 cm     Diastology LVIDs:         2.50 cm     LV e' medial:    6.68 cm/s LV PW:         0.80 cm     LV E/e' medial:  6.5 LV IVS:        0.80 cm     LV e' lateral:   11.00 cm/s LVOT diam:     1.70 cm     LV E/e' lateral: 3.9 LV SV:         42 LV SV Index:   24 LVOT Area:     2.27 cm  LV Volumes (MOD) LV vol d, MOD A2C: 70.1 ml LV vol d, MOD A4C: 46.6 ml LV vol s, MOD A2C: 37.3 ml LV vol s, MOD A4C: 23.3 ml LV SV MOD A2C:     32.8 ml LV SV MOD A4C:     46.6 ml LV SV MOD BP:      28.7 ml RIGHT VENTRICLE RV S prime:     9.32 cm/s TAPSE (M-mode): 1.7 cm LEFT ATRIUM             Index        RIGHT ATRIUM          Index LA diam:        2.70 cm 1.53 cm/m   RA Area:     9.52 cm LA Vol (A2C):   28.1 ml 15.90 ml/m  RA Volume:   20.50 ml 11.60 ml/m LA Vol (A4C):   26.3 ml 14.88 ml/m LA Biplane Vol: 28.8 ml 16.30 ml/m  AORTIC VALVE LVOT Vmax:   115.00 cm/s LVOT Vmean:  68.700 cm/s LVOT VTI:    0.185 m  AORTA Ao Root diam: 2.90 cm Ao Asc diam:  3.20 cm MITRAL VALVE MV Area (PHT): 3.11 cm     SHUNTS MV Decel Time: 244 msec    Systemic VTI:  0.18 m MV E velocity: 43.20 cm/s  Systemic Diam: 1.70 cm MV A velocity: 64.50 cm/s MV E/A ratio:  0.67 Rudean Haskell MD Electronically signed by Rudean Haskell MD Signature Date/Time: 01/21/2022/4:04:02 PM    Final    MR BRAIN WO CONTRAST  Result Date: 01/21/2022 CLINICAL DATA:  TIA.  Sudden onset of aphasia, now resolved. EXAM: MRI HEAD WITHOUT CONTRAST TECHNIQUE: Multiplanar, multiecho pulse sequences of the brain and surrounding structures were obtained without intravenous contrast. COMPARISON:  None Available. FINDINGS: Brain: Small acute infarct in the left frontal Rob[place]aux matter. No hemorrhage, hydrocephalus, mass, or atrophy. Minimal chronic small vessel changes may be present in the pons. No prior infarct is seen. Vascular: Major flow voids are preserved Skull and upper cervical spine: Normal marrow signal. Degenerative facet spurring where covered in the cervical spine with mild C2-3 anterolisthesis. Sinuses/Orbits: Negative IMPRESSION: Small acute infarct in the left frontal Coberly matter. Electronically Signed   By: Jorje Guild M.D.   On: 01/21/2022 12:32    Labs:  Basic Metabolic Panel: Recent Labs  Lab 02/01/22 0700 02/02/22 0445 02/03/22 0530 02/05/22 1101  NA 141 142 140 140  K 3.4* 3.4* 3.5 3.6  CL 110 109 109 108  CO2 _0 GLUCOSE 109* 112* 108* 113*  BUN <5* 6* 5* 8  CREATININE 0.66 0.57 0.59 0.68  CALCIUM 8.6* 8.8* 8.9 9.3  MG  --   --  2.1  --   PHOS  --   --  4.1  --     CBC: Recent Labs  Lab 02/03/22 0530 02/05/22 1101 02/06/22 0557  WBC 4.4 6.5 5.8  NEUTROABS  --  4.4  --   HGB 9.7* 10.8* 11.1*  HCT 28.6* 33.6* 33.4*  MCV 86.4 90.1 88.1  PLT 184 289 258    CBG: No results for input(s): "GLUCAP" in the last 168 hours.  Family history.  Father with hypertension.  Denies any colon cancer or esophageal cancer or rectal cancer  Brief HPI:   Chloe Johnson is a 66 y.o. 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  She lives with spouse.  Independent prior to admission.  Presented 01/28/2022 with acute onset of right-sided weakness and aphasia.  Patient just recently returned from traveling to [place] 5 days ago.  Admission chemistries unremarkable except urine drug screen positive marijuana troponin 41.  CT/MRI showed new acute infarct in left posterior frontal and anterior parietal cortex.  MRA showed no intracranial large vessel occlusion or significant stenosis.  Underwent bilateral common carotid arteriogram as well as right vertebral artery angiogram showing occluded superior division of left MCA artery in the M2 region and underwent mechanical thrombectomy for revascularization per interventional radiology.  She was seen by neurology service as well as cardiology services and was noted to have history of recent CVA secondary to LV thrombus initially maintained on Eliquis transition to Coumadin with Lovenox bridging INR 2.5 as well as low-dose aspirin.  Hypercoagulable and autoimmune work-up was negative.  Hospital course urinalysis positive nitrite 21-50 placed on  course of Rocephin x3 days.  Therapy evaluations completed due to patient right-sided weakness decreased functional mobility was admitted for comprehensive rehab program.   Hospital Course: Chloe Johnson was admitted to rehab 02/04/2022 for inpatient therapies to consist of PT, ST and OT at least three hours five days a week. Past admission physiatrist, therapy team and rehab RN have worked together to provide customized collaborative inpatient rehab.  Pertaining to patient's left MCA infarction with left M2  occlusion status post revascularization as well as history of LV thrombus recently on Eliquis.  She did have a small left groin hematoma after revascularization procedure.  Vascular ultrasound 02/05/2022 showed no evidence of pseudoaneurysm.  Patient has been transitioned to Coumadin therapy.  No bleeding episodes.  She would remain on low-dose aspirin.  Coumadin clinic will continue to follow with INR goal 2.5 and latest INR 1.9.  Chronic neck and back pain maintained on Lyrica.  Noted UTI Staphylococcus pansensitive transitioned from Rocephin to nitrofurantoin.  Blood pressure was soft dropping to 80s/ 50s maintained on Entresto that was decreased to daily and exhibiting no signs of fluid overload.  Crestor for hyperlipidemia.  Urine drug screen was positive for marijuana patient receiving counseled regards to cessation of illicit drug products.  Constipation improved with laxative.   Blood pressures were monitored on TID basis and trolled   Rehab course: During patient's stay in rehab weekly team conferences were held to monitor patient's progress, set goals and discuss barriers to discharge. At admission, patient required modified independent 160 feet without assistive device supervision sit to stand  Physical exam.  Blood pressure 107/71 pulse 86 temperature 97.4 respirations 18 oxygen saturations 100% room air Constitutional.  No acute distress HEENT Head.  Normocephalic and atraumatic Eyes.  Pupils round and reactive to light no discharge without nystagmus Neck.  Supple nontender no JVD without thyromegaly Cardiac regular rate and rhythm without any extra sounds or murmur heard Abdomen.  Soft nontender positive bowel sounds without rebound Respiratory effort normal no respiratory distress without wheeze Skin.  Warm and dry Neurologic.  Expressive aphasia, follows commands.  Able to use communication board.  Right facial weakness. Strength 5/5 left upper and left lower extremity Strength 5/5  right lower extremity Strength 4/5 left shoulder abduction, 3/5 elbow flexion extension 0/5 wrist extension 1-2/5 finger flexion  He/She  has had improvement in activity tolerance, balance, postural control as well as ability to compensate for deficits. He/She has had improvement in functional use RUE/LUE  and RLE/LLE as well as improvement in awareness.  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  Speech therapy continue to follow for aphasia.  She was able to communicate simple needs.  Full family teaching completed plan discharged to home       Disposition: Discharged to home    Diet: Regular.  Special Instructions: No driving smoking or alcohol  Follow-up Coumadin clinic [place] for check of INR Tuesday, 02/11/2022 [phone].  Call for appointment.  Patient's primary cardiologist with Dr. Acie Fredrickson   Medications at discharge. 1.  Tylenol as needed 2.  Aspirin 81 mg p.o. daily 3.  Colace 100 mg p.o. twice daily 4.  Pepcid 20 mg p.o. daily 5.  Ferrous sulfate 325 mg p.o. daily 6.  Claritin 10 mg p.o. daily 7.  Toprol-XL 12.5 mg p.o. daily 8.  Nitrofurantoin 100 mg every 12 hours x5 days and stop 9.  MiraLAX daily  10.  Lyrica 100 mg p.o.  daily and 200 mg QHS 11.  Crestor 20 mg p.o. daily 12.  Entresto 24-26 mg 1 p.o. daily 13.  Coumadin latest dose of 2 mg daily with INR goal 2.5-3   30-35 minutes was spent completing discharge summary and discharge planning  Discharge Instructions     Ambulatory referral to Neurology   Complete by: As directed    An appointment is requested in approximately: 4 weeks left MCA infarction   Ambulatory referral to Physical Medicine Rehab   Complete by: As directed    Moderate complexity follow up 1-2 weeks left MCA CVA        Follow-up Information     Raulkar, Clide Deutscher, MD Follow up.   Specialty: Physical Medicine and  Rehabilitation Why: Office to call for appointment Contact information: [address] [place] [address] [phone]         Nahser, Wonda Cheng, MD Follow up.   Specialty: Cardiology Why: Call for appointment Contact information: [address] [place]. [address] [place] [address] [phone]                 Signed: Lavon Paganini [place] 02/07/2022, 7:49 AM

## 2022-02-05 NOTE — Progress Notes (Signed)
Patient ID: Chloe Johnson, female   DOB: 01/05/1956, 66 y.o.   MRN: [id number] Met with the patient and spouse to review current situation, rehab process, team conference and plan of care. Patient with dominant right side weakness, and aphasia post left posterior frontal/parietal infarct. Reviewed HH dietary modifications with medications. Reports constipation addressed. Rocephin IVPB through 9/14 per MD. Continue to follow along to address educational needs to facilitate preparation for discharge; anticipate short LOS. Margarito Liner

## 2022-02-05 NOTE — Progress Notes (Signed)
ANTICOAGULATION CONSULT NOTE- follow-up  Pharmacy Consult for Enoxaparin and Warfarin Indication: LV thrombus  Allergies  Allergen Reactions   Doxycycline Nausea Only    malaise    Erythromycin Nausea And Vomiting   Iodine     unknown   Latex Other (See Comments)    Irritation of the skin   Shellfish Allergy Other (See Comments)    GI- Facial  Acne   Strawberry Extract Other (See Comments)    Mouth ulcers    Vancomycin    Vicodin [Hydrocodone-Acetaminophen] Other (See Comments)    Restless, tolerate percocet   Dilaudid [Hydromorphone Hcl] Rash    *Pt states she can take if given benadryl prior*   Keflex [Cephalexin] Rash   Penicillins Rash    Hives   Sulfa Antibiotics Rash    Hives    Patient Measurements: Height: 5' (152.4 cm) Weight: 57 kg (125 lb 10.6 oz) IBW/kg (Calculated) : 45.5  Vital Signs: Temp: 98.4 F (36.9 C) (09/13 0409) Temp Source: Oral (09/13 0409) BP: 105/67 (09/13 0409) Pulse Rate: 64 (09/13 0409)  Labs: Recent Labs    02/03/22 0530 02/04/22 0500 02/05/22 1101  HGB 9.7*  --  10.8*  HCT 28.6*  --  33.6*  PLT 184  --  289  LABPROT 23.4* 31.1* 26.6*  INR 2.1* 3.0* 2.5*  CREATININE 0.59  --  0.68     Estimated Creatinine Clearance: 56.2 mL/min (by C-G formula based on SCr of 0.68 mg/dL).   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       aspirin EC 81 MG tablet Take 1 tablet (81 mg total) by mouth daily. Swallow whole. 30 tablet 12    cefTRIAXone 1 g in sodium chloride 0.9 % 100 mL Inject 1 g into the vein daily.      diphenhydrAMINE (BENADRYL) 50 MG/ML injection Inject 0.25 mLs (12.5 mg total) into the vein every 6 (six) hours as needed for itching or allergies.  0    docusate (COLACE) 50 MG/5ML liquid Take 10 mLs (100 mg total) by mouth 2 (two) times daily. 100 mL 0    ferrous sulfate 325 (65 FE) MG tablet Take 1 tablet (325 mg total) by mouth daily with breakfast.  3    loratadine (CLARITIN) 10 MG tablet Take 1 tablet (10 mg total) by mouth daily.      metoprolol succinate (TOPROL-XL) 25 MG 24 hr tablet Take 0.5 tablets (12.5 mg total) by mouth daily.      MYRBETRIQ 25 MG TB24 tablet Take 25 mg by mouth daily.      polyethylene glycol (MIRALAX / GLYCOLAX) 17 g packet Take 17 g by mouth daily. 14 each 0    rosuvastatin (CRESTOR) 20 MG tablet Take 1 tablet (20 mg total) by  mouth daily. 30 tablet 1    sacubitril-valsartan (ENTRESTO) 24-26 MG Take 1 tablet by mouth 2 (two) times daily. 60 tablet     warfarin (COUMADIN) 2 MG tablet Take 1 tablet (2 mg total) by mouth one time only at 4 PM.      Scheduled:   aspirin EC  81 mg Oral Daily   Chlorhexidine Gluconate Cloth  6 each Topical Daily   docusate  100 mg Oral BID   famotidine  20 mg Oral Daily   ferrous sulfate  325 mg Oral Q breakfast   loratadine  10 mg Oral Daily   metoprolol succinate  12.5 mg Oral Daily   nitrofurantoin (macrocrystal-monohydrate)  100 mg Oral Q12H   polyethylene glycol  17 g Oral Daily   pregabalin  100 mg Oral BID   rosuvastatin  20 mg Oral Daily   sacubitril-valsartan  1 tablet Oral BID   sodium chloride flush  10-40 mL Intracatheter Q12H   Warfarin - Pharmacist Dosing Inpatient   Does not apply q1600    Assessment: 56 yoF with admitted for stroke s/p thrombectomy on 9/5. 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  Readmitted with recurrent CVA after 7  days of eliquis with compliance. Last Eliquis dose 9/5 @0800 . Pharmacy consulted for apixaban switch to warfarin with enoxaparin bridge. Stop the Lovenox since INR goal was reach   INR 2.5- therapeutic. L groin hematoma is stable. H/H 10s, plt stable.  L. Groin Hematoma is stable. The nurse is going to mark the margins so we can monitor for expansion.    No DDI noted.   Goal of Therapy:  INR 2.5-3  (2.5 per Dr. ) Monitor platelets by anticoagulation protocol: Yes   Plan:  Warfarin 2 mg PO today x1 Check INR , CBC daily  F/U hematoma size  Cardiologist noted target INR 2.5 and he plans for patient follow up at coumadin clinic at [facility]    [facility] BS, PharmD, BCPS Clinical Pharmacist 02/05/2022 12:49 PM  Contact: [phone] after 3 PM  "Be curious, not judgmental..." [phone]

## 2022-02-05 NOTE — Progress Notes (Signed)
Inpatient Rehabilitation  Patient information reviewed and entered into eRehab system by Cherissa Hook M. Orest Dygert, M.A., CCC/SLP, PPS Coordinator.  Information including medical coding, functional ability and quality indicators will be reviewed and updated through discharge.    

## 2022-02-05 NOTE — Discharge Instructions (Addendum)
Inpatient Rehab Discharge Instructions  Chloe Johnson Discharge date and time: No discharge date for patient encounter.   Activities/Precautions/ Functional Status: Activity: activity as tolerated Diet: regular diet Wound Care: Routine skin checks Functional status:  ___ No restrictions     ___ Walk up steps independently ___ 24/7 supervision/assistance   ___ Walk up steps with assistance ___ Intermittent supervision/assistance  ___ Bathe/dress independently ___ Walk with walker     _x__ Bathe/dress with assistance ___ Walk Independently    ___ Shower independently ___ Walk with assistance    ___ Shower with assistance ___ No alcohol     ___ Return to work/school ________  Special Instructions: No driving smoking or alcohol   Follow-up [facility] for check of INR 02/11/2022 [phone].  Call for appointment.   COMMUNITY REFERRALS UPON DISCHARGE:    Outpatient:  OT &   SP             Agency: [facility]  [address] [place] [address] Phone:[phone]              Appointment Date/Time: WILL ALL HUSBAND TO SET UP FOLLOW UP APPOINTMENTS  Medical Equipment/Items Ordered: NONE RECOMMENDED                                                 Agency/Supplier:NA     My questions have been answered and I understand these instructions. I will adhere to these goals and the provided educational materials after my discharge from the hospital.  Patient/Caregiver Signature _______________________________ Date __________  Clinician Signature _______________________________________ Date __________  Please bring this form and your medication list with you to all your follow-up doctor's appointments.    Information on my medicine - Coumadin   (Warfarin)   Why was Coumadin prescribed for you? Coumadin was prescribed for you because you have a blood clot or a medical condition that can cause an increased risk of  forming blood clots. Blood clots can cause serious health problems by blocking the flow of blood to the heart, lung, or brain. Coumadin can prevent harmful blood clots from forming. As a reminder your indication for Coumadin is:  Deep Venous Thrombosis treatment  What test will check on my response to Coumadin? While on Coumadin (warfarin) you will need to have an INR test regularly to ensure that your dose is keeping you in the desired range. The INR (international normalized ratio) number is calculated from the result of the laboratory test called prothrombin time (PT).  If an INR APPOINTMENT HAS NOT ALREADY BEEN MADE FOR YOU please schedule an appointment to have this lab work done by your health care provider within 7 days. Your INR goal is usually a number between:  2 to 3 or your provider may give you a more narrow range like 2-2.5.  Ask your health care provider during an office visit what your goal INR is.  What  do you need to  know  About  COUMADIN? Take Coumadin (warfarin) exactly as prescribed by your healthcare provider about the same time each day.  DO NOT stop taking without talking to the doctor who prescribed the medication.  Stopping without other blood clot prevention medication to take the place of Coumadin may increase your risk of developing a new clot or stroke.  Get  refills before you run out.  What do you do if you miss a dose? If you miss a dose, take it as soon as you remember on the same day then continue your regularly scheduled regimen the next day.  Do not take two doses of Coumadin at the same time.  Important Safety Information A possible side effect of Coumadin (Warfarin) is an increased risk of bleeding. You should call your healthcare provider right away if you experience any of the following: Bleeding from an injury or your nose that does not stop. Unusual colored urine (red or dark brown) or unusual colored stools (red or black). Unusual bruising for unknown  reasons. A serious fall or if you hit your head (even if there is no bleeding).  Some foods or medicines interact with Coumadin (warfarin) and might alter your response to warfarin. To help avoid this: Eat a balanced diet, maintaining a consistent amount of Vitamin K. Notify your provider about major diet changes you plan to make. Avoid alcohol or limit your intake to 1 drink for women and 2 drinks for men per day. (1 drink is 5 oz. wine, 12 oz. beer, or 1.5 oz. liquor.)  Make sure that ANY health care provider who prescribes medication for you knows that you are taking Coumadin (warfarin).  Also make sure the healthcare provider who is monitoring your Coumadin knows when you have started a new medication including herbals and non-prescription products.  Coumadin (Warfarin)  Major Drug Interactions  Increased Warfarin Effect Decreased Warfarin Effect  Alcohol (large quantities) Antibiotics (esp. Septra/Bactrim, Flagyl, Cipro) Amiodarone (Cordarone) Aspirin (ASA) Cimetidine (Tagamet) Megestrol (Megace) NSAIDs (ibuprofen, naproxen, etc.) Piroxicam (Feldene) Propafenone (Rythmol SR) Propranolol (Inderal) Isoniazid (INH) Posaconazole (Noxafil) Barbiturates (Phenobarbital) Carbamazepine (Tegretol) Chlordiazepoxide (Librium) Cholestyramine (Questran) Griseofulvin Oral Contraceptives Rifampin Sucralfate (Carafate) Vitamin K   Coumadin (Warfarin) Major Herbal Interactions  Increased Warfarin Effect Decreased Warfarin Effect  Garlic Ginseng Ginkgo biloba Coenzyme Q10 Green tea St. John's wort    Coumadin (Warfarin) FOOD Interactions  Eat a consistent number of servings per week of foods HIGH in Vitamin K (1 serving =  cup)  Collards (cooked, or boiled & drained) Kale (cooked, or boiled & drained) Mustard greens (cooked, or boiled & drained) Parsley *serving size only =  cup Spinach (cooked, or boiled & drained) Swiss chard (cooked, or boiled & drained) Turnip greens  (cooked, or boiled & drained)  Eat a consistent number of servings per week of foods MEDIUM-HIGH in Vitamin K (1 serving = 1 cup)  Asparagus (cooked, or boiled & drained) Broccoli (cooked, boiled & drained, or raw & chopped) Brussel sprouts (cooked, or boiled & drained) *serving size only =  cup Lettuce, raw (green leaf, endive, romaine) Spinach, raw Turnip greens, raw & chopped   These websites have more information on Coumadin (warfarin):  http://www.king-russell.com/; https://www.hines.net/;

## 2022-02-05 NOTE — Evaluation (Signed)
Physical Therapy Assessment and Plan  Patient Details  Name: Chloe Johnson MRN: [id number] Date of Birth: December 15, 1955  PT Diagnosis: Hemiparesis dominant and Hypertonia Rehab Potential: Excellent ELOS: 1-2 days   Today's Date: 02/05/2022 PT Individual Time: 6222-9798 PT Individual Time Calculation (min): 60 min    Hospital Problem: Principal Problem:   Left middle cerebral artery stroke [facility])   Past Medical History:  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/16/2013   L3-4/notes 11/24/2013, arms, neck   Exertional asthma    GERD (gastroesophageal reflux disease)    Hypertension    Kidney stones    "have always passed them"   Neck pain    Osteomyelitis ([place]) 11/23/2013   osteomyelitis, discitis    Sleep concern    uses Prozac for sleep    Past Surgical History:  Past Surgical History:  Procedure Laterality Date   BREAST CYST EXCISION Left 2010   "polypectomy"   IR CT HEAD LTD  01/21/2022   IR CT HEAD LTD  01/28/2022   IR PERCUTANEOUS ART THROMBECTOMY/INFUSION INTRACRANIAL INC DIAG ANGIO  01/21/2022   IR PERCUTANEOUS ART THROMBECTOMY/INFUSION INTRACRANIAL INC DIAG ANGIO  01/28/2022   IR US GUIDE VASC ACCESS RIGHT  01/22/2022   PICC LINE PLACE PERIPHERAL ([place] [facility]) Right    for use of Levaquin & Vancomycin, of note: she reports that she had a "flulike feeling"    RADIOLOGY WITH ANESTHESIA N/A 01/21/2022   Procedure: IR WITH ANESTHESIA;  Surgeon: Luanne Bras, MD;  Location: [place];  Service: Radiology;  Laterality: N/A;   RADIOLOGY WITH ANESTHESIA N/A 01/28/2022   Procedure: IR WITH ANESTHESIA;  Surgeon: Radiologist, Medication, MD;  Location: [place];  Service: Radiology;  Laterality: N/A;   TONSILLECTOMY AND ADENOIDECTOMY  1975   TUBAL LIGATION  1999    Assessment & Plan Clinical Impression: Patient is a 66 year old right-handed female with history significant for 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  She lives with her spouse in a 2 store home. Husband says she can stay on the first floor if needed. About 5 steps to enter the home. Husband works during the day.  Patient reported independent prior to admission without assistive device.  Presented 01/28/2022 with acute onset of right-sided weakness and aphasia.  Patient just recently returned home from traveling to [place] 5 days ago.  Admission chemistries urine drug screen positive marijuana, troponin 41 glucose 148.  CT/MRI showed new acute infarct in left posterior frontal and anterior parietal cortex.  MRA showed no intracranial large vessel occlusion or significant stenosis.  Underwent bilateral common carotid arteriogram as well as right vertebral artery angiogram showing occluded superior division of left MCA artery in the M2 region and underwent mechanical thrombectomy for revascularization per interventional radiology.  She was seen by neurology as well as cardiology (primary cardiologist Dr. Acie Fredrickson) and with noted history of recent CVA secondary to LV thrombus maintained on Eliquis she was switched to Coumadin with Lovenox bridging INR goal 2.5 as well as low-dose aspirin.  Hypercoagulable and autoimmune work up was negative. Hospital course urinalysis positive nitrite 21-50 WBCs and placed on course of Rocephin x3 days. She is tolerating regular diet with  thin liquids. Reports BM 4 days ago and again today with use of suppository. Therapy evaluations completed due to patient decreased functional mobility right side weakness was admitted for a comprehensive rehab program. Patient transferred to CIR on 02/04/2022 .    Patient is currently independent  with mobility without an AD. Prior to hospitalization, patient was independent  with mobility and lived with Spouse in a House home.  Home access is 5Stairs to enter.  Patient will benefit from skilled PT intervention to  improve community reintegration and RUE strengthening  for planned discharge home with intermittent assist.  Anticipate patient will not need PT follow up at discharge.  PT - End of Session Activity Tolerance: Tolerates 30+ min activity without fatigue Endurance Deficit: No PT Assessment Rehab Potential (ACUTE/IP ONLY): Excellent PT Barriers to Discharge: Lack of/limited family support PT Patient demonstrates impairments in the following area(s): Sensory PT Transfers Functional Problem(s): Other (comment) (Community transfers in unfamiliar environments) PT Locomotion Functional Problem(s): Other (comment) (Community mobility) PT Plan PT Intensity: Minimum of 1-2 x/day ,45 to 90 minutes PT Frequency: 5 out of 7 days PT Duration Estimated Length of Stay: 1-2 days PT Treatment/Interventions: Discharge planning;UE/LE Coordination activities;Neuromuscular re-education;Community reintegration PT Transfers Anticipated Outcome(s): independent PT Locomotion Anticipated Outcome(s): independent PT Recommendation Follow Up Recommendations: None Patient destination: Home Equipment Recommended: None recommended by PT   PT Evaluation Precautions/Restrictions Precautions Precautions: Other (comment) Precaution Comments: Expressive aphasia, RUE weakness Restrictions Weight Bearing Restrictions: No Pain Pain Assessment Pain Scale: 0-10 Pain Score: 0-No pain Pain Interference Pain Interference Pain Effect on Sleep: 2. Occasionally Pain Interference with Therapy Activities: 1. Rarely or not at all Pain Interference with Day-to-Day Activities: 2. Occasionally Home Living/Prior Functioning Home Living Living Arrangements:  Spouse/significant other Available Help at Discharge: Family;Friend(s);Available 24 hours/day Type of Home: House Home Access: Stairs to enter [employer] of Steps: 5 Entrance Stairs-Rails: None Home Layout: Two level Alternate Level Stairs-Number of Steps: flight Alternate Level Stairs-Rails: Right Bathroom Shower/Tub: Walk-in shower;Tub/shower unit Armed forces training and education officer: Yes Additional Comments: Husband works, able to take time off initially if needed  Lives With: Spouse Prior Function Level of Independence: Independent with basic ADLs;Independent with homemaking with ambulation;Independent with transfers  Able to Take Stairs?: Yes Driving: Yes Vocation: Full time employment Vocation Requirements: RN - clinical research Vision/Perception  Vision - History Ability to See in Adequate Light: 0 Adequate Perception Perception: Within Functional Limits Praxis Praxis: Intact  Cognition Overall Cognitive Status: Within Functional Limits for tasks assessed Arousal/Alertness: Awake/alert Orientation Level: Oriented to person Attention: Focused;Sustained;Selective;Alternating Focused Attention: Appears intact Sustained Attention: Appears intact Selective Attention: Appears intact Alternating Attention: Appears intact Problem Solving: Appears intact Sequencing: Appears intact Safety/Judgment: Appears intact Comments: UTA cognitive functioning  secondary to aphasia Sensation Sensation Light Touch: Appears Intact (diminshed on R side of face) Hot/Cold: Appears Intact Proprioception: Appears Intact Stereognosis: Not tested Coordination Gross Motor Movements are Fluid and Coordinated: Yes Heel Shin Test: [facility] Motor  Motor Motor: Hemiplegia;Abnormal tone Motor - Skilled Clinical Observations: R hemi (RUE)   Trunk/Postural Assessment  Cervical Assessment Cervical Assessment: Within Functional Limits Thoracic Assessment Thoracic  Assessment: Within Functional Limits Lumbar Assessment Lumbar Assessment: Within Functional Limits Postural Control Postural Control: Within Functional Limits  Balance Balance Balance Assessed: Yes Standardized Balance Assessment Standardized Balance Assessment: Berg Balance Test;Functional Gait Assessment Berg Balance Test Sit to Stand: Able to stand without using hands and stabilize independently Standing Unsupported: Able to stand safely 2 minutes Sitting with Back Unsupported but  Feet Supported on Floor or Stool: Able to sit safely and securely 2 minutes Stand to Sit: Sits safely with minimal use of hands Transfers: Able to transfer safely, minor use of hands Standing Unsupported with Eyes Closed: Able to stand 10 seconds safely Standing Ubsupported with Feet Together: Able to place feet together independently and stand 1 minute safely From Standing, Reach Forward with Outstretched Arm: Can reach confidently >25 cm (10") From Standing Position, Pick up Object from Floor: Able to pick up shoe safely and easily From Standing Position, Turn to Look Behind Over each Shoulder: Looks behind from both sides and weight shifts well Turn 360 Degrees: Able to turn 360 degrees safely in 4 seconds or less Standing Unsupported, Alternately Place Feet on Step/Stool: Able to stand independently and safely and complete 8 steps in 20 seconds Standing Unsupported, One Foot in Front: Able to place foot tandem independently and hold 30 seconds Standing on One Leg: Able to lift leg independently and hold > 10 seconds Total Score: 56 Functional Gait  Assessment Gait assessed : Yes Gait Level Surface: Walks 20 ft in less than 5.5 sec, no assistive devices, good speed, no evidence for imbalance, normal gait pattern, deviates no more than 6 in outside of the 12 in walkway width. Change in Gait Speed: Able to smoothly change walking speed without loss of balance or gait deviation. Deviate no more than 6 in  outside of the 12 in walkway width. Gait with Horizontal Head Turns: Performs head turns smoothly with no change in gait. Deviates no more than 6 in outside 12 in walkway width Gait with Vertical Head Turns: Performs head turns with no change in gait. Deviates no more than 6 in outside 12 in walkway width. Gait and Pivot Turn: Pivot turns safely within 3 sec and stops quickly with no loss of balance. Step Over Obstacle: Is able to step over 2 stacked shoe boxes taped together (9 in total height) without changing gait speed. No evidence of imbalance. Gait with Narrow Base of Support: Is able to ambulate for 10 steps heel to toe with no staggering. Gait with Eyes Closed: Walks 20 ft, no assistive devices, good speed, no evidence of imbalance, normal gait pattern, deviates no more than 6 in outside 12 in walkway width. Ambulates 20 ft in less than 7 sec. Ambulating Backwards: Walks 20 ft, no assistive devices, good speed, no evidence for imbalance, normal gait Steps: Alternating feet, no rail. Total Score: 30 Extremity Assessment      RLE Assessment RLE Assessment: Within Functional Limits LLE Assessment LLE Assessment: Within Functional Limits  Care Tool Care Tool Bed Mobility Roll left and right activity   Roll left and right assist level: Independent    Sit to lying activity   Sit to lying assist level: Independent    Lying to sitting on side of bed activity   Lying to sitting on side of bed assist level: the ability to move from lying on the back to sitting on the side of the bed with no back support.: Independent     Care Tool Transfers Sit to stand transfer   Sit to stand assist level: Independent    Chair/bed transfer   Chair/bed transfer assist level: Independent     Toilet transfer   Assist Level: Ship broker transfer assist level: Independent      Care Tool Locomotion Ambulation   Assist level: Independent Assistive device: No Device Max  distance:  150'  Walk 10 feet activity   Assist level: Independent Assistive device: No Device   Walk 50 feet with 2 turns activity   Assist level: Independent Assistive device: No Device  Walk 150 feet activity   Assist level: Independent Assistive device: No Device  Walk 10 feet on uneven surfaces activity   Assist level: Independent    Stairs   Assist level: Independent Stairs assistive device: No device Max number of stairs: 12  Walk up/down 1 step activity   Walk up/down 1 step (curb) assist level: Independent Walk up/down 1 step or curb assistive device: No device  Walk up/down 4 steps activity   Walk up/down 4 steps assist level: Independent Walk up/down 4 steps assistive device: No device  Walk up/down 12 steps activity   Walk up/down 12 steps assist level: Independent Walk up/down 12 steps assistive device: No device  Pick up small objects from floor   Pick up small object from the floor assist level: Independent    Wheelchair Is the patient using a wheelchair?: No          Wheel 50 feet with 2 turns activity      Wheel 150 feet activity        Refer to Care Plan for Long Term Goals  SHORT TERM GOAL WEEK 1 PT Short Term Goal 1 (Week 1): STG = LTG due to ELOS  Recommendations for other services: None   Skilled Therapeutic Intervention Mobility Bed Mobility Bed Mobility: Rolling Right;Rolling Left;Supine to Sit;Sit to Supine Rolling Right: Independent Rolling Left: Independent Supine to Sit: Independent Sit to Supine: Independent Transfers Transfers: Sit to Stand;Stand to [employer] Transfers Sit to Stand: Independent Stand to Sit: Independent Stand Pivot Transfers: Independent Transfer (Assistive device): None Locomotion  Gait Ambulation: Yes Gait Assistance: Independent Gait Distance (Feet): 150 Feet Assistive device: None Gait Gait: Yes Gait Pattern: Within Functional Limits Stairs / Additional Locomotion Stairs: Yes Stairs  Assistance: Independent Stair Management Technique: No rails Number of Stairs: 12 Height of Stairs: 6 Wheelchair Mobility Wheelchair Mobility: No   Skilled Intervention: Pt greeted in room with husband at bedside. Pt agreeable to PT evaluation. Husband assisted with PLOF and social factors 2/2 expressive aphasia. Pt able to use her communication board very well in her room by pointing to letters to spell out words. She also will attempt to verbalize but is mostly intelligible. She completes all functional mobility at independent level without an AD. See above for details. Completed BERG and FGA testing as outlined above, scoring perfect scores. Focused remainder of session on NMR for RUE strengthening. This included activities in quadruped positioning on mat table and in antigravity positioning for R shoulder and rotator cuff strengthening. Concluded session in room with husband updated on findings. Husband reporting that patient's strength in RUE is already improving since this morning and pleased with her progress. Pt is aware of her short LOS due to high level mobility but will address community ambulation tomorrow and continue NMR for RUE strengthening.   Discharge Criteria: Patient will be discharged from PT if patient refuses treatment 3 consecutive times without medical reason, if treatment goals not met, if there is a change in medical status, if patient makes no progress towards goals or if patient is discharged from hospital.  The above assessment, treatment plan, treatment alternatives and goals were discussed and mutually agreed upon: by patient  Pike PT 02/05/2022, 3:47 PM

## 2022-02-05 NOTE — Plan of Care (Deleted)
  Problem: RH Comprehension Communication Goal: LTG Patient will comprehend basic/complex auditory (SLP) Description: LTG: Patient will comprehend basic/complex auditory information with cues (SLP). Flowsheets (Taken 02/05/2022 1243) LTG: Patient will comprehend: Complex auditory information LTG: Patient will comprehend auditory information with cueing (SLP): Supervision   Problem: RH Expression Communication Goal: LTG Patient will verbally express basic/complex needs(SLP) Description: LTG:  Patient will verbally express basic/complex needs, wants or ideas with cues  (SLP) Flowsheets (Taken 02/05/2022 1243) LTG: Patient will verbally express basic/complex needs, wants or ideas (SLP):  Moderate Assistance - Patient 50 - 74%  Maximal Assistance - Patient 25 - 49%

## 2022-02-05 NOTE — Evaluation (Signed)
Speech Language Pathology Assessment and Plan  Patient Details  Name: Chloe Johnson MRN: [id number] Date of Birth: November 18, 1955  SLP Diagnosis: Aphasia;Apraxia;Dysphagia  Rehab Potential: Good ELOS: 2 days   Today's Date: 02/05/2022 SLP Individual Time: 0932-6712 SLP Individual Time Calculation (min): 55 min   Hospital Problem: Principal Problem:   Left middle cerebral artery stroke [facility])  Past Medical History:  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/16/2013   L3-4/notes 11/24/2013, arms, neck   Exertional asthma    GERD (gastroesophageal reflux disease)    Hypertension    Kidney stones    "have always passed them"   Neck pain    Osteomyelitis ([place]) 11/23/2013   osteomyelitis, discitis    Sleep concern    uses Prozac for sleep    Past Surgical History:  Past Surgical History:  Procedure Laterality Date   BREAST CYST EXCISION Left 2010   "polypectomy"   IR CT HEAD LTD  01/21/2022   IR CT HEAD LTD  01/28/2022   IR PERCUTANEOUS ART THROMBECTOMY/INFUSION INTRACRANIAL INC DIAG ANGIO  01/21/2022   IR PERCUTANEOUS ART THROMBECTOMY/INFUSION INTRACRANIAL INC DIAG ANGIO  01/28/2022   IR US GUIDE VASC ACCESS RIGHT  01/22/2022   PICC LINE PLACE PERIPHERAL ([place] HX) Right    for use of Levaquin & Vancomycin, of note: she reports that she had a "flulike feeling"    RADIOLOGY WITH ANESTHESIA N/A 01/21/2022   Procedure: IR WITH ANESTHESIA;  Surgeon: Luanne Bras, MD;  Location: [place];  Service: Radiology;  Laterality: N/A;   RADIOLOGY WITH ANESTHESIA N/A 01/28/2022   Procedure: IR WITH ANESTHESIA;  Surgeon: Radiologist, Medication, MD;  Location: [place];  Service: Radiology;  Laterality: N/A;   TONSILLECTOMY AND ADENOIDECTOMY  1975   TUBAL LIGATION  1999    Assessment / Plan / Recommendation Clinical Impression History of Present Illness: Chloe Johnson is a 66 year old right-handed female with history significant for 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  Per chart review patient lives with spouse.  Multilevel home.  Husband works during the day.  Patient reported independent prior to admission without assistive device.  Presented 01/28/2022 with acute onset of right-sided weakness and aphasia.  Patient just recently returned home from traveling to [place] 5 days ago.  CT/MRI showed new acute infarct in left posterior frontal and anterior parietal cortex.  MRA showed no intracranial large vessel occlusion or significant stenosis.  Underwent bilateral common carotid arteriogram as well as right vertebral artery angiogram showing occluded superior division of left MCA artery in the M2 region and underwent revascularization per interventional radiology.  Hospital course follow-up neurology as well as cardiology and with noted history of recent CVA secondary to LV thrombus maintained on Eliquis was switched to Coumadin with Lovenox bridging INR goal 2.5.  Hospital course urinalysis positive nitrite 21-50 WBCs and placed on course of Rocephin x3 days.  Therapy evaluations completed due to patient decreased functional mobility right side weakness was admitted for a comprehensive rehab program on 02/04/2022. Complete NIHSS TOTAL: 5  SLP consulted to complete CSE and language evaluation in the setting of acute L MCA CVA. Pt received awake/alert and sitting in chair; Mod  I in room for mobility. Husband present. Agreeable to ST evaluation in hospital room.  Per CSE, pt presents with mild oral deficits (R>L) c/b decreased ROM and loss of sensation on R, which results in minimal R buccal residuals  and intermittent R anterior spillage. Pt utilized compensatory strategies to clear oral residuals and anterior spillage with Min A verbal cues for wiping mouth, utilizing lingual sweep, and liquid rinse. No evidence concerning for pharyngeal dysphagia observed. Pt self-administered thin liquid via cup and straw, puree, soft solid, and regular solids textures with no clinical s/sx concerning for airway invasion appreciated. Pt does endorse coughing with PO intake via multimodal communication; however, no s/sx noted during this evaluation. Per chart review, VSS. Given risk of aspiration and development of aspiration related complications remain low, continue to recommend regular diet textures (with self-selection of softer textures, as needed) with thin liquids with use of lingual sweep to R and liquid rinse to clear oral residuals post-swallow. Will monitor during CIR admission.  Per informal speech and language assessment measures, pt presents with significant verbal apraxia, at least mild dysarthria, and expressive aphasia, with auditory comprehension appearing relatively intact. At the onset of today's evaluation, pt did not attempt to verbalize; however, with time, encouragement, and model prompts, pt began to attempt verbal approximations of words and phrases during dynamic assessment. Appeared stimulable for articulatory placement cues, model prompts, and melodic intonation techniques. Utilized Public affairs consultant (accurate spelling of single words) and gestures with Mod I. Currently, pt's primary means of communication include use of low-tech AAC board, gestures, and responses to yes/no questions. Verbal responses remain limited, though pt is motivated to improve and participate in therapy.  Given assessment findings, skilled ST intervention appears indicated at this time to address diet tolerance and verbal apraxia and aphasia. Recommend 24/7 supervision and assistance + OP ST at time of  discharge. Pt and pt's spouse provided with education and verbalized agreement with proposed ST POC. Please see below for additional information re: pt's performance.   Skilled Therapeutic Interventions          Informal and dynamic assessment completed. Please see above for details.   SLP Assessment  Patient will need skilled Speech Lanaguage Pathology Services during CIR admission    Recommendations  SLP Diet Recommendations: Age appropriate regular solids;Thin Liquid Administration via: Cup;Straw Medication Administration: Whole meds with liquid Supervision: Patient able to self feed;Intermittent supervision to cue for compensatory strategies Compensations: Slow rate;Small sips/bites;Lingual sweep for clearance of pocketing;Follow solids with liquid Postural Changes and/or Swallow Maneuvers: Seated upright 90 degrees Oral Care Recommendations: Oral care BID Recommendations for Other Services: Neuropsych consult Patient destination: Home Follow up Recommendations: Outpatient SLP Equipment Recommended: None recommended by SLP    SLP Frequency 3 to 5 out of 7 days   SLP Duration  SLP Intensity  SLP Treatment/Interventions 2 days  Minumum of 1-2 x/day, 30 to 90 minutes  Multimodal communication approach;Speech/Language facilitation;Patient/family education;Dysphagia/aspiration precaution training    Pain Pain Assessment Pain Scale: 0-10 Pain Score: 0-No pain  Prior Functioning Cognitive/Linguistic Baseline: Baseline deficits Baseline deficit details: per SLP note from evaluation during recent past admission, 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 and family reporting some difficulty with memory and cognitive processing Type of Home: House  Lives With: Spouse Available Help at Discharge: Family;Friend(s);Available 24 hours/day Education: 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, corporate  -  [employer])  SLP Evaluation Cognition Overall Cognitive Status: Difficult to  assess Arousal/Alertness: Awake/alert Orientation  Level: Oriented to person Comments: UTA cognitive functioning  secondary to aphasia  Comprehension Auditory Comprehension Overall Auditory Comprehension: Impaired Yes/No Questions: Within Functional Limits Commands: Impaired One Step Basic Commands: 75-100% accurate Two Step Basic Commands: 75-100% accurate Complex Commands: 75-100% accurate Conversation: Simple Interfering Components: Motor planning;Processing speed EffectiveTechniques: Extra processing time;Visual/Gestural cues;Repetition Visual Recognition/Discrimination Discrimination: Not tested Reading Comprehension Reading Status: Not tested Expression Expression Primary Mode of Expression: Verbal Verbal Expression Overall Verbal Expression: Impaired Initiation: Impaired Automatic Speech: Counting;Day of week;Month of year (impaired) Level of Generative/Spontaneous Verbalization: Word Repetition: Impaired Level of Impairment: Word level Naming: No impairment Effective Techniques: Semantic cues;Sentence completion;Phonemic cues;Articulatory cues;Melodic intonation Non-Verbal Means of Communication: Gestures;Communication board Written Expression Dominant Hand: Right Written Expression: Not tested (Could spell single words accurately on alphabet board) Oral Motor Oral Motor/Sensory Function Overall Oral Motor/Sensory Function: Mild impairment Facial ROM: Reduced right Facial Symmetry: Abnormal symmetry right Facial Strength: Reduced right Facial Sensation: Reduced right Lingual ROM: Reduced right Lingual Symmetry: Abnormal symmetry right Lingual Strength: Reduced;Suspected CN XII (hypoglossal) dysfunction Lingual Sensation: Reduced;Suspected CN VII (facial) dysfunction-anterior 2/3 tongue Velum: Within Functional Limits Mandible: Within Functional Limits Motor Speech Overall Motor Speech: Impaired Respiration: Impaired Level of Impairment: Word Phonation: Low  vocal intensity Articulation: Impaired Level of Impairment: Word Intelligibility: Intelligibility reduced Word: 50-74% accurate Phrase: 50-74% accurate Motor Planning: Impaired Level of Impairment: Word Motor Speech Errors: Inconsistent;Groping for words  Care Tool Care Tool Cognition Ability to hear (with hearing aid or hearing appliances if normally used Ability to hear (with hearing aid or hearing appliances if normally used): 0. Adequate - no difficulty in normal conservation, social interaction, listening to TV   Expression of Ideas and Wants Expression of Ideas and Wants: 2. Frequent difficulty - frequently exhibits difficulty with expressing needs and ideas   Understanding Verbal and Non-Verbal Content Understanding Verbal and Non-Verbal Content: 3. Usually understands - understands most conversations, but misses some part/intent of message. Requires cues at times to understand  Memory/Recall Ability Memory/Recall Ability : That he or she is in a hospital/hospital unit   Intelligibility: Intelligibility reduced Word: 50-74% accurate Phrase: 50-74% accurate  Bedside Swallowing Assessment General Date of Onset: 01/28/22 Previous Swallow Assessment: 01/29/2022 Diet Prior to this Study: Regular;Thin liquids Temperature Spikes Noted: No Respiratory Status: Room air History of Recent Intubation: No Behavior/Cognition: Alert;Cooperative Oral Cavity - Dentition: Adequate natural dentition Self-Feeding Abilities: Able to feed self Vision: Functional for self-feeding Patient Positioning: Upright in bed Baseline Vocal Quality: Hoarse Volitional Cough: Weak Volitional Swallow: Able to elicit  Oral Care Assessment Oral Assessment  (WDL): Within Defined Limits Lips: Asymmetrical Teeth: Intact Tongue: Pink;Moist Mucous Membrane(s): Moist;Pink Saliva: Moist, saliva free flowing Level of Consciousness: Alert Is patient on any of following O2 devices?: None of the above Nutritional  status: Dysphagia Oral Assessment Risk : High Risk Ice Chips   Thin Liquid Thin Liquid: Within functional limits Presentation: Cup;Straw Nectar Thick Nectar Thick Liquid: Not tested Honey Thick Honey Thick Liquid: Not tested Puree Puree: Impaired Presentation: Self Fed;Spoon Oral Phase Impairments: Reduced labial seal Oral Phase Functional Implications: Right anterior spillage Solid Solid: Impaired Presentation: Self Fed Oral Phase Functional Implications: Right lateral sulci pocketing (minimal) BSE Assessment Suspected Esophageal Findings Suspected Esophageal Findings:  (N/A) Risk for Aspiration Impact on safety and function: Mild aspiration risk Other Related Risk Factors: History of GERD;Previous CVA  Short Term Goals: Week 1: SLP Short Term Goal 1 (Week 1): NA due to ELOS  Refer to Care Plan for Long Term  Goals  Recommendations for other services: Neuropsych  Discharge Criteria: Patient will be discharged from SLP if patient refuses treatment 3 consecutive times without medical reason, if treatment goals not met, if there is a change in medical status, if patient makes no progress towards goals or if patient is discharged from hospital.  The above assessment, treatment plan, treatment alternatives and goals were discussed and mutually agreed upon: by patient and by family  Romelle Starcher A Dayshawn Irizarry 02/05/2022, 12:11 PM

## 2022-02-05 NOTE — Progress Notes (Signed)
Called at 8:40 pm regarding orders to change dressing on central line. Venous access nursing service requested order to remove triple lumen catheter, order placed.

## 2022-02-05 NOTE — Progress Notes (Signed)
PROGRESS NOTE   Subjective/Complaints: Husband at bedside Said she was unable to fall asleep after being woken at 4am for vitals She is willing to replace Ativan with higher dose of Lyrica at night  ROS: +back pain  Objective:   No results found. Recent Labs    02/03/22 0530  WBC 4.4  HGB 9.7*  HCT 28.6*  PLT 184   Recent Labs    02/03/22 0530  NA 140  K 3.5  CL 109  CO2 25  GLUCOSE 108*  BUN 5*  CREATININE 0.59  CALCIUM 8.9    Intake/Output Summary (Last 24 hours) at 02/05/2022 1041 Last data filed at 02/05/2022 0739 Gross per 24 hour  Intake 360 ml  Output --  Net 360 ml        Physical Exam: Vital Signs Blood pressure 105/67, pulse 64, temperature 98.4 F (36.9 C), temperature source Oral, resp. rate 16, height 5' (1.524 m), weight 57 kg, SpO2 100 %. Gen: no distress, normal appearing HEENT: oral mucosa pink and moist, NCAT Cardio: Reg rate Chest: normal effort, normal rate of breathing Abd: soft, non-distended Ext: no edema Psych: pleasant, normal affect Skin: Clean and intact without signs of breakdown Neuro:  Expressive aphasia, follows commands, able to use communication board, able to say a few words now, able to name 1/4 objects, not able to repeat, Right facial weakness, decrease sensation reported around her R mouth and tongue, PERRLA, EOMI, Right facial weakness present, vision intact and hearing intact to finger rub, Tongue deviates right, sensation altered in RUE to light touch, normal sensation to LT in LUE, LLE and RLE.  Strength 5/5 in LLE and LUE Strength 5/5 in RLE Strength 4/5 L shoulder abduction, 3/5 elbow flexion and extension, 0/5 wrist extension, 1-2/5 finger flexion  Left FTN Intact Musculoskeletal:  Full ROM, no joint swelling noted, no tone decrease R wrist  Ecchymosis left inguinal area with dressing CDI   Assessment/Plan: 1. Functional deficits which require 3+  hours per day of interdisciplinary therapy in a comprehensive inpatient rehab setting. Physiatrist is providing close team supervision and 24 hour management of active medical problems listed below. Physiatrist and rehab team continue to assess barriers to discharge/monitor patient progress toward functional and medical goals  Care Tool:  Bathing              Bathing assist       Upper Body Dressing/Undressing Upper body dressing        Upper body assist      Lower Body Dressing/Undressing Lower body dressing            Lower body assist       Toileting Toileting    Toileting assist       Transfers Chair/bed transfer  Transfers assist           Locomotion Ambulation   Ambulation assist              Walk 10 feet activity   Assist           Walk 50 feet activity   Assist           Walk 150 feet activity  Assist           Walk 10 feet on uneven surface  activity   Assist           Wheelchair     Assist               Wheelchair 50 feet with 2 turns activity    Assist            Wheelchair 150 feet activity     Assist          Blood pressure 105/67, pulse 64, temperature 98.4 F (36.9 C), temperature source Oral, resp. rate 16, height 5' (1.524 m), weight 57 kg, SpO2 100 %.    Medical Problem List and Plan: 1. Functional deficits secondary to left MCA punctate scattered infarct with left M2 occlusion status post IR with revascularization, embolic secondary to LV thrombus recently on Eliquis             -patient may shower             -ELOS/Goals: 7-10 days, Supervision PT/OT, Min-Mod with SLP             --Interdisciplinary Team Conference today   2.  Antithrombotics: -DVT/anticoagulation:  Pharmaceutical: Coumadin             -antiplatelet therapy: Aspirin 81 mg daily 3. Chronic neck and back pain: Increase Lyrica to 100mg  BID 4. Insomnia: replace Xanax with increased dose of  Lyrica to help her sleep 5. Neuropsych/cognition: This patient is not capable of making decisions on her own behalf. 6. Skin/Wound Care: Routine skin checks 7. Fluids/Electrolytes/Nutrition: Routine-output follow-up chemistries 8.  UTI.  3 day course of Rocephin. UC positive for staphylococcus epidermis that is pansensitive will transition to Nitrofurantoin. 9.  Essential hypertension.  Entresto 24-26 mg twice daily -well controlled overall, avoid hypotension 10.  Follow congestive heart failure.  Continue Entresto.  Monitor for any signs fluid overload. Daily weights      Filed Weights    02/04/22 1726  Weight: 57 kg    11.  Hyperlipidemia.  Crestor 20mg  daily 12.  UDS positive marijuana.  Provide counseling 13. Constipation             -Continue miralax daily, continue colace 14. Anemia             -Recheck CBC 15. Likely hematoma L groin, VAS 04/06/22 completed, monitor for bleeding  LOS: 1 days A FACE TO FACE EVALUATION WAS PERFORMED  P Tayler Lassen 02/05/2022, 10:41 AM

## 2022-02-05 NOTE — Progress Notes (Signed)
Inpatient Rehabilitation Center Individual Statement of Services  Patient Name:  Chloe Johnson  Date:  02/05/2022  Welcome to the Inpatient Rehabilitation Center.  Our goal is to provide you with an individualized program based on your diagnosis and situation, designed to meet your specific needs.  With this comprehensive rehabilitation program, you will be expected to participate in at least 3 hours of rehabilitation therapies Monday-Friday, with modified therapy programming on the weekends.  Your rehabilitation program will include the following services:  Physical Therapy (PT), Occupational Therapy (OT), Speech Therapy (ST), 24 hour per day rehabilitation nursing, Therapeutic Recreaction (TR), Care Coordinator, Rehabilitation Medicine, Nutrition Services, and Pharmacy Services  Weekly team conferences will be held on Wednesday to discuss your progress.  Your Inpatient Rehabilitation Care Coordinator will talk with you frequently to get your input and to update you on team discussions.  Team conferences with you and your family in attendance may also be held.  Expected length of stay: 3 days  Overall anticipated outcome: mod/I level-supervision-SP  Depending on your progress and recovery, your program may change. Your Inpatient Rehabilitation Care Coordinator will coordinate services and will keep you informed of any changes. Your Inpatient Rehabilitation Care Coordinator's name and contact numbers are listed  below.  The following services may also be recommended but are not provided by the Inpatient Rehabilitation Center:  Driving Evaluations Home Health Rehabiltiation Services Outpatient Rehabilitation Services Vocational Rehabilitation   Arrangements will be made to provide these services after discharge if needed.  Arrangements include referral to agencies that provide these services.  Your insurance has been verified to be:  Medicare A and BCBS-Fed Your primary doctor is:  Hillard Danker  Pertinent information will be shared with your doctor and your insurance company.  Inpatient Rehabilitation Care Coordinator:  Dossie Der, [place] [phone] or Luna Glasgow  Information discussed with and copy given to patient by: Lucy Chris, 02/05/2022, 1:11 PM

## 2022-02-05 NOTE — Plan of Care (Signed)
  Problem: RH Eating Goal: LTG Patient will perform eating w/assist, cues/equip (OT) Description: LTG: Patient will perform eating with assist, with/without cues using equipment (OT) Flowsheets (Taken 02/05/2022 1555) LTG: Pt will perform eating with assistance level of: Set up assist    Problem: RH Grooming Goal: LTG Patient will perform grooming w/assist,cues/equip (OT) Description: LTG: Patient will perform grooming with assist, with/without cues using equipment (OT) Flowsheets (Taken 02/05/2022 1555) LTG: Pt will perform grooming with assistance level of: Set up assist    Problem: RH Bathing Goal: LTG Patient will bathe all body parts with assist levels (OT) Description: LTG: Patient will bathe all body parts with assist levels (OT) Flowsheets (Taken 02/05/2022 1555) LTG: Pt will perform bathing with assistance level/cueing: Independent with assistive device  LTG: Position pt will perform bathing: Shower   Problem: RH Dressing Goal: LTG Patient will perform upper body dressing (OT) Description: LTG Patient will perform upper body dressing with assist, with/without cues (OT). Flowsheets (Taken 02/05/2022 1555) LTG: Pt will perform upper body dressing with assistance level of: Independent Goal: LTG Patient will perform lower body dressing w/assist (OT) Description: LTG: Patient will perform lower body dressing with assist, with/without cues in positioning using equipment (OT) Flowsheets (Taken 02/05/2022 1555) LTG: Pt will perform lower body dressing with assistance level of: Independent   Problem: RH Toileting Goal: LTG Patient will perform toileting task (3/3 steps) with assistance level (OT) Description: LTG: Patient will perform toileting task (3/3 steps) with assistance level (OT)  Flowsheets (Taken 02/05/2022 1555) LTG: Pt will perform toileting task (3/3 steps) with assistance level: Independent   Problem: RH Functional Use of Upper Extremity Goal: LTG Patient will use RT/LT  upper extremity as a (OT) Description: LTG: Patient will use right/left upper extremity as a stabilizer/gross assist/diminished/nondominant/dominant level with assist, with/without cues during functional activity (OT) Flowsheets (Taken 02/05/2022 1555) LTG: Use of upper extremity in functional activities: RUE as gross assist level LTG: Pt will use upper extremity in functional activity with assistance level of: Supervision/Verbal cueing

## 2022-02-05 NOTE — Patient Care Conference (Addendum)
Inpatient RehabilitationTeam Conference and Plan of Care Update Date: 02/05/2022   Time: 11:18 AM    Patient Name: Chloe Johnson      Medical Record Number: [id number]  Date of Birth: 1956-01-09 Sex: Female         Room/Bed: 4W08C/4W08C-01 Payor Info: Payor: MEDICARE / Plan: MEDICARE PART A / Product Type: *No Product type* /    Admit Date/Time:  02/04/2022  5:24 PM  Primary Diagnosis:  Left middle cerebral artery stroke [facility])  Hospital Problems: Principal Problem:   Left middle cerebral artery stroke [facility])    Expected Discharge Date: Expected Discharge Date: 02/07/22  Team Members Present: Physician leading conference: Dr. Sula Soda Social Worker Present: Dossie Der, LCSW Nurse Present: Chana Bode, RN PT Present: Wynelle Link, PT OT Present: Other (comment) Dewitt Hoes, OT) SLP Present: Sarita Bottom, SLP PPS Coordinator present : Fae Pippin, SLP     Current Status/Progress Goal Weekly Team Focus  Bowel/Bladder   Continent of B/B. LBM 912/23  Remain continent.  Assist with toileting needs.   Swallow/Nutrition/ Hydration   Regular textures and thin liquids - Sup A  Sup A  Tolerance of regular diet and thin liquids   ADL's             Mobility      PT EVAL PENDING      Communication   Mod-Max A  Mod A for verbal expression  Verbal expression   Safety/Cognition/ Behavioral Observations  Difficult to address due to aphasia - not addressing at this time  N/A  N/A   Pain   Generalized pain. Tylenol/Lyrica as prescribed.  <3/10. Assess and treat.  Assess Q4 and prn.   Skin   Intact  Prevent new skin breakdown  Assess QS and prn.     Discharge Planning:  new evalution today-home with husband who can take time off from work if needed.   Team Discussion: Patient doing well overall. UTI treated with IV abx; MD change to pio. Xanax at HS changed to lyrica.  Patient on target to meet rehab goals: yes, currently needs min assist for ADLs due to  right inattention and right UE weakness. SLP working on aphasia.  *See Care Plan and progress notes for long and short-term goals.   Revisions to Treatment Plan:  Modified schedule to increase SLP services  Teaching Needs: Safety, medications, dietary modifications, etc.  Current Barriers to Discharge: Decreased caregiver support  Possible Resolutions to Barriers: Family education OP follow up services for SLP and OT     Medical Summary Current Status: left groin hematoma, UTI, anxiety, insomnia, UTI, left MCA CVA  Barriers to Discharge: Medical stability;Wound care  Barriers to Discharge Comments: left groin hematoma, UTI, anxiety, insomnia, UTI, left MCA CVA Possible Resolutions to [employer] Focus: continue to monitor hematoma daily, continue coumadin, switch antibiotic to keflex, discontinue Xanax, increase Lyrica back to 100mg  BID   Continued Need for Acute Rehabilitation Level of Care: The patient requires daily medical management by a physician with specialized training in physical medicine and rehabilitation for the following reasons: Direction of a multidisciplinary physical rehabilitation program to maximize functional independence : Yes Medical management of patient stability for increased activity during participation in an intensive rehabilitation regime.: Yes Analysis of laboratory values and/or radiology reports with any subsequent need for medication adjustment and/or medical intervention. : Yes   I attest that I was present, lead the team conference, and concur with the assessment and plan of the team.  Chana Bode B 02/05/2022, 1:56 PM

## 2022-02-05 NOTE — Evaluation (Signed)
Occupational Therapy Assessment and Plan  Patient Details  Name: Chloe Johnson MRN: [id number] Date of Birth: 05/10/1956  OT Diagnosis: apraxia and hemiplegia affecting dominant side Rehab Potential: Rehab Potential (ACUTE ONLY): Excellent ELOS: 3 days   Today's Date: 02/05/2022 OT Individual Time: 8850-2774;  1287-8676 OT Individual Time Calculation (min): 60 min;  27 min     Hospital Problem: Principal Problem:   Left middle cerebral artery stroke [facility])   Past Medical History:  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/16/2013   L3-4/notes 11/24/2013, arms, neck   Exertional asthma    GERD (gastroesophageal reflux disease)    Hypertension    Kidney stones    "have always passed them"   Neck pain    Osteomyelitis ([place]) 11/23/2013   osteomyelitis, discitis    Sleep concern    uses Prozac for sleep    Past Surgical History:  Past Surgical History:  Procedure Laterality Date   BREAST CYST EXCISION Left 2010   "polypectomy"   IR CT HEAD LTD  01/21/2022   IR CT HEAD LTD  01/28/2022   IR PERCUTANEOUS ART THROMBECTOMY/INFUSION INTRACRANIAL INC DIAG ANGIO  01/21/2022   IR PERCUTANEOUS ART THROMBECTOMY/INFUSION INTRACRANIAL INC DIAG ANGIO  01/28/2022   IR US GUIDE VASC ACCESS RIGHT  01/22/2022   PICC LINE PLACE PERIPHERAL ([place] HX) Right    for use of Levaquin & Vancomycin, of note: she reports that she had a "flulike feeling"    RADIOLOGY WITH ANESTHESIA N/A 01/21/2022   Procedure: IR WITH ANESTHESIA;  Surgeon: Luanne Bras, MD;  Location: [place];  Service: Radiology;  Laterality: N/A;   RADIOLOGY WITH ANESTHESIA N/A 01/28/2022   Procedure: IR WITH ANESTHESIA;  Surgeon: Radiologist, Medication, MD;  Location: [place];  Service: Radiology;  Laterality: N/A;   TONSILLECTOMY AND ADENOIDECTOMY  1975   TUBAL LIGATION  1999    Assessment & Plan Clinical Impression: Patient is a 66 year old right-handed female with history significant  for 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  She lives with her spouse in a 2 store home. Husband says she can stay on the first floor if needed. About 5 steps to enter the home. Husband works during the day.  Patient reported independent prior to admission without assistive device.  Presented 01/28/2022 with acute onset of right-sided weakness and aphasia.  Patient just recently returned home from traveling to [place] 5 days ago.  Admission chemistries urine drug screen positive marijuana, troponin 41 glucose 148.  CT/MRI showed new acute infarct in left posterior frontal and anterior parietal cortex.  MRA showed no intracranial large vessel occlusion or significant stenosis.  Underwent bilateral common carotid arteriogram as well as right vertebral artery angiogram showing occluded superior division of left MCA artery in the M2 region and underwent mechanical thrombectomy for revascularization per interventional radiology.  She was seen by neurology as well as cardiology (primary cardiologist Dr. Acie Fredrickson) and with noted history of recent CVA secondary to LV thrombus maintained on Eliquis she was switched to Coumadin with Lovenox bridging INR goal 2.5 as well as low-dose aspirin.   Patient transferred to CIR on 02/04/2022 .    Patient currently requires min with  basic self-care skills secondary to abnormal tone, unbalanced muscle activation, motor apraxia, decreased coordination, and decreased motor planning and decreased attention to right and decreased motor planning.  Prior to hospitalization, patient could complete ADL/IADL with independent .  Patient will  benefit from skilled intervention to decrease level of assist with basic self-care skills and increase independence with basic self-care skills prior to discharge home with care partner.  Anticipate patient will require  intermittent min assist with bimanual tasks  and follow up outpatient.  OT - End of Session Activity Tolerance: Endurance does not limit participation in activity Endurance Deficit: No OT Assessment Rehab Potential (ACUTE ONLY): Excellent OT Patient demonstrates impairments in the following area(s): Motor;Sensory OT Basic ADL's Functional Problem(s): Eating;Grooming;Bathing;Dressing;Toileting OT Additional Impairment(s): Fuctional Use of Upper Extremity OT Plan OT Intensity: Minimum of 1-2 x/day, 45 to 90 minutes OT Frequency: 5 out of 7 days OT Duration/Estimated Length of Stay: 3 days OT Treatment/Interventions: Therapeutic Exercise;Neuromuscular re-education;Patient/family education;DME/adaptive equipment instruction;Functional mobility training;Self Care/advanced ADL retraining OT Self Feeding Anticipated Outcome(s): mod I OT Basic Self-Care Anticipated Outcome(s): mod I OT Toileting Anticipated Outcome(s): mod I OT Bathroom Transfers Anticipated Outcome(s): independent OT Recommendation Patient destination: Home Follow Up Recommendations: Outpatient OT Equipment Details: NA   OT Evaluation Precautions/Restrictions  Precautions Precautions: Other (comment) Precaution Comments: Expressive aphasia, RUE weakness Restrictions Weight Bearing Restrictions: No   Pain Pain Assessment Pain Score: 0-No pain Home Living/Prior Functioning Home Living Living Arrangements: Spouse/significant other Available Help at Discharge: Family, Friend(s), Available 24 hours/day Type of Home: House Home Access: Stairs to enter Technical brewer of Steps: 5 Entrance Stairs-Rails: None Home Layout: Two level Alternate Level Stairs-Number of Steps: flight Alternate Level  Stairs-Rails: Right Bathroom Shower/Tub: Gaffer, Optometrist: Yes Additional Comments: Husband works, able to take time off initially if needed  Lives With: Spouse IADL History Current License: Yes Education: Copywriter, advertising Occupation: Full time employment Type of Occupation: Electrical engineer Leisure and Hobbies: Exercise - tennis, walking Prior Function Level of Independence: Independent with basic ADLs, Independent with homemaking with ambulation, Independent with transfers  Able to Take Stairs?: Yes Driving: Yes Vocation: Full time employment Vocation Requirements: Therapist, sports - clinical research Vision Baseline Vision/History: 1 Wears glasses Ability to See in Adequate Light: 0 Adequate Patient Visual Report: Blurring of vision Vision Assessment?: No apparent visual deficits Additional Comments: Patient reports that vision is improving - was blurry initially Perception  Perception: Within Functional Limits Praxis Praxis: Impaired Praxis Impairment Details: Initiation Praxis-Other Comments: Poor motor initiation in RUE Cognition Cognition Overall Cognitive Status: Within Functional Limits for tasks assessed Arousal/Alertness: Awake/alert Orientation Level: Person;Place;Situation Person: Oriented Place: Oriented Situation: Oriented Memory: Appears intact Attention: Focused;Sustained;Selective Focused Attention: Appears intact Sustained Attention: Appears intact Sustained Attention Impairment: Functional basic Selective Attention: Appears intact Awareness: Appears intact Problem Solving: Appears intact Problem Solving Impairment: Functional basic Sequencing: Appears intact Organizing: Appears intact Safety/Judgment: Appears intact Comments: Patient with excellent eye contact, use of communication board, very persistent in making sure she is communicating her point accurately - and listener is understanding Brief Interview for  Mental Status (BIMS) Repetition of Three Words (First Attempt): No answer Temporal Orientation: Year: Correct Temporal Orientation: Month: Accurate within 5 days Temporal Orientation: Day: Correct Recall: "Sock": No answer Recall: "Blue": No answer Recall: "Bed": No answer BIMS Summary Score: 99 Sensation Sensation Light Touch: Impaired by gross assessment Peripheral sensation comments: Has sensation but diminished - mild impairment Light Touch Impaired Details: Impaired RUE Hot/Cold: Appears Intact  Proprioception: Appears Intact Stereognosis: Not tested Additional Comments: Unable to test sterognosis due to absent finger movement Coordination Gross Motor Movements are Fluid and Coordinated: Yes Fine Motor Movements are Fluid and Coordinated: No Finger Nose Finger Test: Unable Heel Shin Test: [facility] 9 Hole Peg Test: Unable Motor  Motor Motor: Hemiplegia;Abnormal tone;Motor apraxia;Motor impersistence Motor - Skilled Clinical Observations: R hemi (RUE)  Trunk/Postural Assessment  Cervical Assessment Cervical Assessment: Within Functional Limits Thoracic Assessment Thoracic Assessment: Within Functional Limits Lumbar Assessment Lumbar Assessment: Within Functional Limits Postural Control Postural Control: Within Functional Limits  Balance Balance Balance Assessed: Yes Standardized Balance Assessment Standardized Balance Assessment: Berg Balance Test;Functional Gait Assessment Berg Balance Test Sit to Stand: Able to stand without using hands and stabilize independently Standing Unsupported: Able to stand safely 2 minutes Sitting with Back Unsupported but Feet Supported on Floor or Stool: Able to sit safely and securely 2 minutes Stand to Sit: Sits safely with minimal use of hands Transfers: Able to transfer safely, minor use of hands Standing Unsupported with Eyes Closed: Able to stand 10 seconds safely Standing Ubsupported with Feet Together: Able to place feet together  independently and stand 1 minute safely From Standing, Reach Forward with Outstretched Arm: Can reach confidently >25 cm (10") From Standing Position, Pick up Object from Floor: Able to pick up shoe safely and easily From Standing Position, Turn to Look Behind Over each Shoulder: Looks behind from both sides and weight shifts well Turn 360 Degrees: Able to turn 360 degrees safely in 4 seconds or less Standing Unsupported, Alternately Place Feet on Step/Stool: Able to stand independently and safely and complete 8 steps in 20 seconds Standing Unsupported, One Foot in Front: Able to place foot tandem independently and hold 30 seconds Standing on One Leg: Able to lift leg independently and hold > 10 seconds Total Score: 56 Functional Gait  Assessment Gait assessed : Yes Gait Level Surface: Walks 20 ft in less than 5.5 sec, no assistive devices, good speed, no evidence for imbalance, normal gait pattern, deviates no more than 6 in outside of the 12 in walkway width. Change in Gait Speed: Able to smoothly change walking speed without loss of balance or gait deviation. Deviate no more than 6 in outside of the 12 in walkway width. Gait with Horizontal Head Turns: Performs head turns smoothly with no change in gait. Deviates no more than 6 in outside 12 in walkway width Gait with Vertical Head Turns: Performs head turns with no change in gait. Deviates no more than 6 in outside 12 in walkway width. Gait and Pivot Turn: Pivot turns safely within 3 sec and stops quickly with no loss of balance. Step Over Obstacle: Is able to step over 2 stacked shoe boxes taped together (9 in total height) without changing gait speed. No evidence of imbalance. Gait with Narrow Base of Support: Is able to ambulate for 10 steps heel to toe with no staggering. Gait with Eyes Closed: Walks 20 ft, no assistive devices, good speed, no evidence of imbalance, normal gait pattern, deviates no more than 6 in outside 12 in walkway  width. Ambulates 20 ft in less than 7 sec. Ambulating Backwards: Walks 20 ft, no assistive devices, good speed, no evidence for imbalance, normal gait Steps: Alternating feet, no rail. Total Score: 30 Extremity/Trunk Assessment RUE Assessment RUE Assessment: Exceptions to [facility] Passive Range of Motion (PROM) Comments: WFL Active Range of Motion (AROM) Comments: 100* shoulder flex then compensates with trunk lateral flexion, elbow flex/ext 50%,  no pronation or digit flex/ext General Strength Comments: NT RUE Body System: Neuro Brunstrum levels for arm and hand: Arm;Hand Brunstrum level for arm: Stage III Synergy is performed voluntarily Brunstrum level for hand: Stage II Synergy is developing LUE Assessment LUE Assessment: Within Functional Limits  Care Tool Care Tool Self Care Eating   Eating Assist Level: Minimal Assistance - Patient > 75%    Oral Care    Oral Care Assist Level: Minimal Assistance - Patient > 75%    Bathing   Body parts bathed by patient: Right arm;Left arm;Chest;Abdomen;Front perineal area;Buttocks;Face;Left lower leg;Right lower leg;Left upper leg;Right upper leg     Assist Level: Minimal Assistance - Patient > 75%    Upper Body Dressing(including orthotics)       Assist Level: Minimal Assistance - Patient > 75%    Lower Body Dressing (excluding footwear)   What is the patient wearing?: Underwear/pull up;Pants Assist for lower body dressing: Minimal Assistance - Patient > 75%    Putting on/Taking off footwear   What is the patient wearing?: Socks;Shoes Assist for footwear: Minimal Assistance - Patient > 75%       Care Tool Toileting Toileting activity   Assist for toileting: Minimal Assistance - Patient > 75%     Care Tool Bed Mobility Roll left and right activity   Roll left and right assist level: Independent    Sit to lying activity   Sit to lying assist level: Independent    Lying to sitting on side of bed activity   Lying to sitting on  side of bed assist level: the ability to move from lying on the back to sitting on the side of the bed with no back support.: Independent     Care Tool Transfers Sit to stand transfer   Sit to stand assist level: Independent    Chair/bed transfer   Chair/bed transfer assist level: Independent     Toilet transfer   Assist Level: Independent     Care Tool Cognition  Expression of Ideas and Wants Expression of Ideas and Wants: 2. Frequent difficulty - frequently exhibits difficulty with expressing needs and ideas  Understanding Verbal and Non-Verbal Content Understanding Verbal and Non-Verbal Content: 3. Usually understands - understands most conversations, but misses some part/intent of message. Requires cues at times to understand   Memory/Recall Ability Memory/Recall Ability : That he or she is in a hospital/hospital unit   Refer to Care Plan for Long Term Goals      Recommendations for other services: None    Skilled Therapeutic Intervention:   AM Session:   Patient received seated on edge of bed with husband present in room.  Patient expressively aphasic although using communication board well.  Patient agreeable to shower, and was able to complete all aspects of BADL unilaterally - with frequent min assist and cueing to attempt use of RUE.  ADL status as follows.   Overt discussion with patient and husband about clear OT needs, and need to ensure need for IP hospitalization.  Explained that team conference scheduled for later this am, and at this point d/c date could be determined.  Both indicate that they are interested in the intensity of CIR.    PM Session:   Patient received seated in bed eager for OT session. Walked to gym to complete NMR RUE.  Supine initially to address shoulder and elbow isolated control with decreased gravitational influence.  Modified plantigrade to address loading weight through extended RUE.  Followed with  facilitation of wrist extension,  pro/supination, and digit flexion!  Patient tearful in seeing hand move.  Patient walked back to room and showed husband new movement.   ADL ADL Eating: Minimal assistance Where Assessed-Eating: Chair Grooming: Minimal assistance Where Assessed-Grooming: Standing at sink Upper Body Bathing: Minimal assistance Where Assessed-Upper Body Bathing: Shower Lower Body Bathing: Minimal assistance Where Assessed-Lower Body Bathing: Shower Upper Body Dressing: Minimal assistance Where Assessed-Upper Body Dressing: Standing at sink Lower Body Dressing: Minimal assistance Where Assessed-Lower Body Dressing: Standing at sink Toileting: Minimal assistance Where Assessed-Toileting: Glass blower/designer: Programmer, applications Method: Counselling psychologist: Other (comment) (NA) Tub/Shower Transfer: Unable to assess Tub/Shower Transfer Method: Unable to assess Tub/Shower Equipment: Other (comment) (Do not anticipate) Gaffer Transfer: Independent Social research officer, government Method: Heritage manager:  (NA) Mobility  Bed Mobility Bed Mobility: Rolling Right;Rolling Left;Supine to Sit;Sit to Supine Rolling Right: Independent Rolling Left: Independent Supine to Sit: Independent Sit to Supine: Independent Transfers Sit to Stand: Independent Stand to Sit: Independent   Discharge Criteria: Patient will be discharged from OT if patient refuses treatment 3 consecutive times without medical reason, if treatment goals not met, if there is a change in medical status, if patient makes no progress towards goals or if patient is discharged from hospital.  The above assessment, treatment plan, treatment alternatives and goals were discussed and mutually agreed upon: by patient and by family  Mariah Milling 02/05/2022, 4:09 PM

## 2022-02-05 NOTE — Plan of Care (Signed)
  Problem: RH Comprehension Communication Goal: LTG Patient will comprehend basic/complex auditory (SLP) Description: LTG: Patient will comprehend basic/complex auditory information with cues (SLP). Flowsheets (Taken 02/05/2022 1243) LTG: Patient will comprehend: Complex auditory information LTG: Patient will comprehend auditory information with cueing (SLP): Supervision   Problem: RH Expression Communication Goal: LTG Patient will verbally express basic/complex needs(SLP) Description: LTG:  Patient will verbally express basic/complex needs, wants or ideas with cues  (SLP) Flowsheets (Taken 02/05/2022 1243) LTG: Patient will verbally express basic/complex needs, wants or ideas (SLP):  Moderate Assistance - Patient 50 - 74%  Maximal Assistance - Patient 25 - 49%   Problem: RH Swallowing Goal: LTG Patient will consume least restrictive diet using compensatory strategies with assistance (SLP) Description: LTG:  Patient will consume least restrictive diet using compensatory strategies with assistance (SLP) Flowsheets (Taken 02/05/2022 1244) LTG: Pt Patient will consume least restrictive diet using compensatory strategies with assistance of (SLP): Modified Independent

## 2022-02-05 NOTE — Plan of Care (Signed)
  Problem: RH Ambulation Goal: LTG Patient will ambulate in community environment (PT) Description: LTG: Patient will ambulate in community environment, # of feet with assistance (PT). Flowsheets (Taken 02/05/2022 1546) LTG: Pt will ambulate in community environ  assist needed:: Independent

## 2022-02-05 NOTE — Progress Notes (Signed)
Inpatient Rehabilitation Care Coordinator Assessment and Plan Patient Details  Name: Chloe Johnson MRN: [id number] Date of Birth: Aug 18, 1955  Today's Date: 02/05/2022  Hospital Problems: Principal Problem:   Left middle cerebral artery stroke [facility])  Past Medical History:  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/16/2013   L3-4/notes 11/24/2013, arms, neck   Exertional asthma    GERD (gastroesophageal reflux disease)    Hypertension    Kidney stones    "have always passed them"   Neck pain    Osteomyelitis (HCC) 11/23/2013   osteomyelitis, discitis    Sleep concern    uses Prozac for sleep    Past Surgical History:  Past Surgical History:  Procedure Laterality Date   BREAST CYST EXCISION Left 2010   "polypectomy"   IR CT HEAD LTD  01/21/2022   IR CT HEAD LTD  01/28/2022   IR PERCUTANEOUS ART THROMBECTOMY/INFUSION INTRACRANIAL INC DIAG ANGIO  01/21/2022   IR PERCUTANEOUS ART THROMBECTOMY/INFUSION INTRACRANIAL INC DIAG ANGIO  01/28/2022   IR US GUIDE VASC ACCESS RIGHT  01/22/2022   PICC LINE PLACE PERIPHERAL ([facility] HX) Right    for use of Levaquin & Vancomycin, of note: she reports that she had a "flulike feeling"    RADIOLOGY WITH ANESTHESIA N/A 01/21/2022   Procedure: IR WITH ANESTHESIA;  Surgeon: Julieanne Cotton, MD;  Location: [facility] OR;  Service: Radiology;  Laterality: N/A;   RADIOLOGY WITH ANESTHESIA N/A 01/28/2022   Procedure: IR WITH ANESTHESIA;  Surgeon: Radiologist, Medication, MD;  Location: [facility] OR;  Service: Radiology;  Laterality: N/A;   TONSILLECTOMY AND ADENOIDECTOMY  1975   TUBAL LIGATION  1999   Social History:  reports that she has never smoked. She has never used smokeless tobacco. She reports current alcohol use of about 6.0 standard drinks of alcohol per week. She reports that she does not use drugs.  Family / Support Systems Marital Status: Married Patient Roles: Spouse, Parent, Other (Comment)  (employee) Spouse/Significant Other: Virl Diamond [phone] Children: Emily-daughter [phone]  Dewayne Hatch Marie-daughter [phone] Other Supports: Friends and co-workers Anticipated Caregiver: Virl Diamond and family Ability/Limitations of Caregiver: Virl Diamond works but can take some time off if needed Caregiver Availability: 24/7 Family Dynamics: Close knit with family, co-workers and extended family. 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 to Work Plans: Depends upon her speech improvements needs to be able to communicate Legal History/Current Legal Issues: No issues Guardian/Conservator: None-according to MD pt is not fully capable of making her own decisions while here, will look toward her husband to make any decisions while here   Abuse/Neglect Abuse/Neglect Assessment Can Be Completed: Yes Physical Abuse: Denies Verbal Abuse: Denies Sexual Abuse: Denies Exploitation of patient/patient's resources: Denies Self-Neglect: Denies  Patient response to: Social Isolation - How often do you feel lonely or isolated from those around you?: Never  Emotional Status Pt's affect, behavior and adjustment status: Pt is motivated to improve she has always been independent and taken care of herself and worked, according to husband. He provided the information while wfie nodded and confirmed information Recent Psychosocial Issues: other health issues  Psychiatric History: No history would benefit from seeing neuro-psych if would be here long enough and could communicate but will be here very short  time Substance Abuse History: No issues  Patient / Family Perceptions, Expectations & Goals Pt/Family understanding of illness & functional limitations: Pt and husband can explain her stroke and deficits. Both talk with the MD and feel they have a good understanding of her treatment plan moving forward. Premorbid pt/family roles/activities: wife, Mom, employee, RN, friend, etc Anticipated changes in roles/activities/participation: resume Pt/family expectations/goals: Pt nods and appears to understand what is beng said to her. Her husband states: " I know she will do well and will need OP therapies at discharge."  [employer]: None Premorbid Home Care/DME Agencies: Other (Comment) (has rw, rollator, cane and tub seat) Transportation available at discharge: Self and husband Is the patient able to respond to transportation needs?: Yes In the past 12 months, has lack of transportation kept you from medical appointments or from getting medications?: No In the past 12 months, has lack of transportation kept you from meetings, work, or from getting things needed for daily living?: No Resource referrals recommended: Neuropsychology  Discharge Planning Living Arrangements: Spouse/significant other Support Systems: Spouse/significant other, Children, Other relatives, Friends/neighbors, Church/faith community Type of Residence: Private residence Insurance Resources: [employer], Media planner (specify) (Fed Acupuncturist) Surveyor, quantity Resources: Employment, Garment/textile technologist Screen Referred: No Living Expenses: Own Money Management: Spouse, Patient Does the patient have any problems obtaining your medications?: No Home Management: Both Patient/Family Preliminary Plans: Return home with husband who is able to take some time off of work to be there with her if needed. He is here today for evaluations and assisting with exchange of information. Care Coordinator Barriers to  Discharge: Insurance for SNF coverage Care Coordinator Anticipated Follow Up Needs: HH/OP  Clinical Impression Very high level patient who is already ambulating in her room independently. Husband is here to assist with communication since pt's main issue is communication and hand issues. Discussed team conference and target discharge date of 9/15. Will make referral to OP @ [facility] due to closer to their home. Continue to follow until Friday  Lucy Chris 02/05/2022, 1:23 PM

## 2022-02-05 NOTE — Progress Notes (Signed)
Left PICC d/c'd.  Vaseline and folded 4x4 gauze pressure dressing applied with manual pressure held x5 minutes.  No bleeding noted post removal.  Patient and family aware to leave dressing on for 24 hours, do not get wet and to call RN if bleeding noted.  Aware to stay in bed for 30 minutes post removal.

## 2022-02-05 NOTE — Progress Notes (Signed)
Limited left lower extremity arterial duplex for pseudoaneurysm evaluation has been completed. Preliminary results can be found in CV Proc through chart review.   02/05/22 1:10 PM Olen Cordial RVT

## 2022-02-06 ENCOUNTER — Encounter (HOSPITAL_COMMUNITY): Payer: Self-pay | Admitting: Physical Medicine and Rehabilitation

## 2022-02-06 ENCOUNTER — Other Ambulatory Visit: Payer: Self-pay

## 2022-02-06 DIAGNOSIS — I63512 Cerebral infarction due to unspecified occlusion or stenosis of left middle cerebral artery: Secondary | ICD-10-CM | POA: Diagnosis not present

## 2022-02-06 LAB — PROTIME-INR
INR: 1.8 — ABNORMAL HIGH (ref 0.8–1.2)
Prothrombin Time: 21 seconds — ABNORMAL HIGH (ref 11.4–15.2)

## 2022-02-06 LAB — CBC
HCT: 33.4 % — ABNORMAL LOW (ref 36.0–46.0)
Hemoglobin: 11.1 g/dL — ABNORMAL LOW (ref 12.0–15.0)
MCH: 29.3 pg (ref 26.0–34.0)
MCHC: 33.2 g/dL (ref 30.0–36.0)
MCV: 88.1 fL (ref 80.0–100.0)
Platelets: 258 10*3/uL (ref 150–400)
RBC: 3.79 MIL/uL — ABNORMAL LOW (ref 3.87–5.11)
RDW: 12.1 % (ref 11.5–15.5)
WBC: 5.8 10*3/uL (ref 4.0–10.5)
nRBC: 0 % (ref 0.0–0.2)

## 2022-02-06 MED ORDER — FERROUS SULFATE 325 (65 FE) MG PO TABS: 325.0000 mg | ORAL_TABLET | Freq: Every day | ORAL | 3 refills | Status: DC

## 2022-02-06 MED ORDER — NITROFURANTOIN MONOHYD MACRO 100 MG PO CAPS: 100.0000 mg | ORAL_CAPSULE | Freq: Two times a day (BID) | ORAL | 0 refills | Status: DC

## 2022-02-06 MED ORDER — PREGABALIN 50 MG PO CAPS
100.0000 mg | ORAL_CAPSULE | Freq: Every day | ORAL | Status: DC
  Administered 2022-02-07: 100 mg via ORAL
  Filled 2022-02-06: qty 2

## 2022-02-06 MED ORDER — METOPROLOL SUCCINATE ER 25 MG PO TB24: 12.5000 mg | ORAL_TABLET | Freq: Every day | ORAL | 0 refills | Status: DC

## 2022-02-06 MED ORDER — SACUBITRIL-VALSARTAN 24-26 MG PO TABS: 1.0000 | ORAL_TABLET | Freq: Two times a day (BID) | ORAL | 0 refills | Status: DC

## 2022-02-06 MED ORDER — FAMOTIDINE 20 MG PO TABS: 20.0000 mg | ORAL_TABLET | Freq: Every day | ORAL | 0 refills | Status: DC

## 2022-02-06 MED ORDER — ROSUVASTATIN CALCIUM 20 MG PO TABS: 20.0000 mg | ORAL_TABLET | Freq: Every day | ORAL | 1 refills | Status: DC

## 2022-02-06 MED ORDER — WARFARIN SODIUM 3 MG PO TABS
3.5000 mg | ORAL_TABLET | Freq: Once | ORAL | Status: AC
  Administered 2022-02-06: 3.5 mg via ORAL
  Filled 2022-02-06: qty 1

## 2022-02-06 MED ORDER — PREGABALIN 75 MG PO CAPS
150.0000 mg | ORAL_CAPSULE | Freq: Every day | ORAL | Status: DC
  Administered 2022-02-06: 150 mg via ORAL
  Filled 2022-02-06: qty 2

## 2022-02-06 MED ORDER — PREGABALIN 100 MG PO CAPS: 100.0000 mg | ORAL_CAPSULE | Freq: Two times a day (BID) | ORAL | 0 refills | Status: DC

## 2022-02-06 MED ORDER — SACUBITRIL-VALSARTAN 24-26 MG PO TABS
1.0000 | ORAL_TABLET | Freq: Every day | ORAL | Status: DC
  Administered 2022-02-07: 1 via ORAL
  Filled 2022-02-06: qty 1

## 2022-02-06 NOTE — Progress Notes (Signed)
Occupational Therapy Session Note  Patient Details  Name: Chloe Johnson MRN: [id number] Date of Birth: 1955-09-25  Today's Date: 02/06/2022 OT Individual Time: 1005-1108 OT Individual Time Calculation (min): 63 min    Short Term Goals: Week 1:   STG = LTG d/t ELOS  Skilled Therapeutic Interventions/Progress Updates:  Pt awake seated at EOB upon OT arrival to the room. Pt reports, "I agree," in regards to education regarding follow-up therapy. Pt in agreement for OT session. Pt participates well in trying to communicate prior to using communication board.   Therapy Documentation Precautions:  Precautions Precautions: Other (comment) Precaution Comments: Expressive aphasia, RUE weakness Restrictions Weight Bearing Restrictions: No Vital Signs: Please see "Flowsheet" for most recent vitals charted by nursing staff.  Pain: Pain Assessment Pain Scale: 0-10 Pain Score: no pain  ADL: Pt declines need to perform ADLs at this time. Pt reports completing ADLs prior to OT session with independence - modified independence. Pt and spouse report no self-care concerns at this time. Pt able to independently ambulate from room <> therapy gym with no concerns or AD.   RUE Neuro Re-Education: OT provides extensive education on neuro re-education principles and techniques. OT reinforces the importance of repetition, visual attention while perform NMR, and optimal environment (limiting distractions) when performing RUE NMR after DC. Pt demo's 4/5 MMT with shoulder flexion, shoulder abd, elbow flexion, and elbow extension. Pt demo's 0/5 MMT with forearm pro/sup, wrist flex/ext, and digital flex/ext. Ot provides education on the importance of visual attention and repetition with RUE NMR. Pt and spouse verbalize understanding.   Home Safety & DC Planning: Pt and spouse report concerns related to communication and safety at home. Pt's spouse reports that pt might be home alone intermittently depending on  his need for work. Spouse reports plan to look into emergency alert necklace and pt has a smartwatch that detects falls. OT provides education on recommendation to look into the emergency alert necklace.   Cognitive Tasks & IADLs: OT provides education on recommendation for pt to participate in IADLs such as medication organization to allow spouse to read instructions to pt and pt participating in organizing medication, pt listening to instructions for meal prep and gathering materials, sequencing laundry, etc to improve higher cognitive tasks. OT encouraged optimal environment with less distractions when pt is participating in these skills. Pt and spouse verbalizes understanding.   Pt returned to EOB at end of session with spouse present and belongings within reach.  Therapy/Group: Individual Therapy  Chloe Johnson 02/06/2022, 5:25 PM

## 2022-02-06 NOTE — Discharge Summary (Signed)
Physical Therapy Discharge Summary  Patient Details  Name: Chloe Johnson MRN: [id number] Date of Birth: 03-02-1956  Date of Discharge from PT service:February 06, 2022  Today's Date: 02/06/2022 PT Individual Time: 1445-1538 PT Individual Time Calculation (min): 53 min    Patient has met 1 of 1 long term goals due to improved balance, increased strength, and functional use of  right upper extremity.  Patient to discharge at an ambulatory level Independent.    Reasons goals not met: n/a  Recommendation:  No follow up PT recommended.   Equipment: No equipment provided  Reasons for discharge: treatment goals met and discharge from hospital  Patient/family agrees with progress made and goals achieved: Yes  PT Discharge Precautions/Restrictions Precautions Precautions: Other (comment) Precaution Comments: Expressive aphasia, RUE weakness Restrictions Weight Bearing Restrictions: No Vital Signs Therapy Vitals Temp: 98.2 F (36.8 C) Temp Source: Oral Pulse Rate: (!) 59 Resp: 17 BP: 118/72 Patient Position (if appropriate): Lying Oxygen Therapy SpO2: 100 % O2 Device: Room Air Pain Interference Pain Interference Pain Effect on Sleep: 2. Occasionally Pain Interference with Therapy Activities: 1. Rarely or not at all Pain Interference with Day-to-Day Activities: 2. Occasionally Vision/Perception  Vision - History Ability to See in Adequate Light: 0 Adequate Perception Perception: Within Functional Limits Praxis Praxis: Impaired Praxis Impairment Details: Initiation Praxis-Other Comments: Decreased motor initiation in RUE  Cognition Overall Cognitive Status: Within Functional Limits for tasks assessed Arousal/Alertness: Awake/alert Attention: Focused;Sustained;Selective;Alternating Focused Attention: Appears intact Sustained Attention: Appears intact Selective Attention: Appears intact Alternating Attention: Appears intact Memory: Appears intact Awareness:  Appears intact Problem Solving: Appears intact Sequencing: Appears intact Safety/Judgment: Appears intact Comments: Patient with excellent eye contact, use of communication board, very persistent in making sure she is communicating her point accurately - and listener is understanding Sensation Sensation Light Touch: Impaired by gross assessment Peripheral sensation comments: Has sensation but diminished - mild impairment Light Touch Impaired Details: Impaired RUE Hot/Cold: Appears Intact Proprioception: Appears Intact Stereognosis: Not tested Coordination Gross Motor Movements are Fluid and Coordinated: Yes Fine Motor Movements are Fluid and Coordinated: No Heel Shin Test: [facility] Motor  Motor Motor: Hemiplegia;Abnormal tone;Motor apraxia;Motor impersistence Motor - Discharge Observations: R hemi (RUE)  Mobility Bed Mobility Bed Mobility: Rolling Right;Rolling Left;Supine to Sit;Sit to Supine Rolling Right: Independent Rolling Left: Independent Supine to Sit: Independent Sit to Supine: Independent Transfers Transfers: Sit to Stand;Stand to Sit;Stand Pivot Transfers Sit to Stand: Independent Stand to Sit: Independent Stand Pivot Transfers: Independent Transfer (Assistive device): None Locomotion  Gait Ambulation: Yes Gait Assistance: Independent Gait Distance (Feet): 500 Feet Assistive device: None Gait Gait: Yes Gait Pattern: Within Functional Limits High Level Ambulation High Level Ambulation: Backwards walking;Direction changes;Sudden stops;Head turns;Other high level ambulation;Side stepping (All completed at independent level with no evidence of imbalance) Stairs / Additional Locomotion Stairs: Yes Stairs Assistance: Independent Stair Management Technique: No rails;Alternating pattern;Forwards Number of Stairs: 12 Height of Stairs: 6 Ramp: Independent Curb: Independent Wheelchair Mobility Wheelchair Mobility: No  Trunk/Postural Assessment  Cervical  Assessment Cervical Assessment: Within Functional Limits Thoracic Assessment Thoracic Assessment: Within Functional Limits Lumbar Assessment Lumbar Assessment: Within Functional Limits Postural Control Postural Control: Within Functional Limits  Balance Balance Balance Assessed: Yes Standardized Balance Assessment Standardized Balance Assessment: Berg Balance Test;Functional Gait Assessment Berg Balance Test Sit to Stand: Able to stand without using hands and stabilize independently Standing Unsupported: Able to stand safely 2 minutes Sitting with Back Unsupported but Feet Supported on Floor or Stool: Able to sit safely and securely 2  minutes Stand to Sit: Sits safely with minimal use of hands Transfers: Able to transfer safely, minor use of hands Standing Unsupported with Eyes Closed: Able to stand 10 seconds safely Standing Ubsupported with Feet Together: Able to place feet together independently and stand 1 minute safely From Standing, Reach Forward with Outstretched Arm: Can reach confidently >25 cm (10") From Standing Position, Pick up Object from Floor: Able to pick up shoe safely and easily From Standing Position, Turn to Look Behind Over each Shoulder: Looks behind from both sides and weight shifts well Turn 360 Degrees: Able to turn 360 degrees safely in 4 seconds or less Standing Unsupported, Alternately Place Feet on Step/Stool: Able to stand independently and safely and complete 8 steps in 20 seconds Standing Unsupported, One Foot in Front: Able to place foot tandem independently and hold 30 seconds Standing on One Leg: Able to lift leg independently and hold > 10 seconds Total Score: 56 Functional Gait  Assessment Gait assessed : Yes Gait Level Surface: Walks 20 ft in less than 5.5 sec, no assistive devices, good speed, no evidence for imbalance, normal gait pattern, deviates no more than 6 in outside of the 12 in walkway width. Change in Gait Speed: Able to smoothly  change walking speed without loss of balance or gait deviation. Deviate no more than 6 in outside of the 12 in walkway width. Gait with Horizontal Head Turns: Performs head turns smoothly with no change in gait. Deviates no more than 6 in outside 12 in walkway width Gait with Vertical Head Turns: Performs head turns with no change in gait. Deviates no more than 6 in outside 12 in walkway width. Gait and Pivot Turn: Pivot turns safely within 3 sec and stops quickly with no loss of balance. Step Over Obstacle: Is able to step over 2 stacked shoe boxes taped together (9 in total height) without changing gait speed. No evidence of imbalance. Gait with Narrow Base of Support: Is able to ambulate for 10 steps heel to toe with no staggering. Gait with Eyes Closed: Walks 20 ft, no assistive devices, good speed, no evidence of imbalance, normal gait pattern, deviates no more than 6 in outside 12 in walkway width. Ambulates 20 ft in less than 7 sec. Ambulating Backwards: Walks 20 ft, no assistive devices, good speed, no evidence for imbalance, normal gait Steps: Alternating feet, no rail. Total Score: 30 Extremity Assessment      RLE Assessment RLE Assessment: Within Functional Limits LLE Assessment LLE Assessment: Within Functional Limits   Skilled Intervention: Pt sitting at EOB on arrival with husband at bedside. Pt reporting she has low BP and used communication board effectively to communicate this. Donned knee-high compression socks with assist.   Orthostatic VS taken: Sitting: 100/56 Standing: 85/58 *Pt asymptomatic. *Medical team notified via secure chat  Pt ambulated independently during session with no evidence of imbalance or safety concerns.  Focused remainder of session on NMR for RUE strengthening. -Completed finger ladder in standing in forward facing and sideways facing directions. Pt unable to isolate digits due to weakness and used gross motor movements, relying heavily on  shoulder strength. -Supine anti-gravity tricep extensions -Supine shoulder horizontal adduction -Supine shoulder abduction to 90deg -Supine D1 flexion/extension with added manual resistance -Supine D2 flexion/extension with added manual resistance -Sitting ball chest press, manual resistance -sitting ball drawing ABC's  Pt ended session in room, all needs met. Pt very appreciative of PT intervention. Reports readiness for tomorrow's DC home with husband.  Tanay Misuraca P Avilene Marrin 02/06/2022, 7:39 AM

## 2022-02-06 NOTE — Progress Notes (Signed)
ANTICOAGULATION CONSULT NOTE- follow-up  Pharmacy Consult for Enoxaparin and Warfarin Indication: LV thrombus  Allergies  Allergen Reactions   Doxycycline Nausea Only    malaise    Erythromycin Nausea And Vomiting   Iodine     unknown   Latex Other (See Comments)    Irritation of the skin   Shellfish Allergy Other (See Comments)    GI- Facial  Acne   Strawberry Extract Other (See Comments)    Mouth ulcers    Vancomycin    Vicodin [Hydrocodone-Acetaminophen] Other (See Comments)    Restless, tolerate percocet   Dilaudid [Hydromorphone Hcl] Rash    *Pt states she can take if given benadryl prior*   Keflex [Cephalexin] Rash   Penicillins Rash    Hives   Sulfa Antibiotics Rash    Hives    Patient Measurements: Height: 5' (152.4 cm) Weight: 57 kg (125 lb 10.6 oz) IBW/kg (Calculated) : 45.5  Vital Signs: Temp: 98.2 F (36.8 C) (09/14 0507) Temp Source: Oral (09/14 0507) BP: 118/72 (09/14 0507) Pulse Rate: 59 (09/14 0507)  Labs: Recent Labs    02/04/22 0500 02/05/22 1101 02/06/22 0557  HGB  --  10.8* 11.1*  HCT  --  33.6* 33.4*  PLT  --  289 258  LABPROT 31.1* 26.6* 21.0*  INR 3.0* 2.5* 1.8*  CREATININE  --  0.68  --      Estimated Creatinine Clearance: 56.2 mL/min (by C-G formula based on SCr of 0.68 mg/dL).   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       aspirin EC 81 MG tablet Take 1 tablet (81 mg total) by mouth daily. Swallow whole. 30 tablet 12    cefTRIAXone 1 g in sodium chloride 0.9 % 100 mL Inject 1 g into the vein daily.      diphenhydrAMINE (BENADRYL) 50 MG/ML injection Inject 0.25 mLs (12.5 mg total) into the vein every 6 (six) hours as needed for itching or allergies.  0    docusate (COLACE) 50 MG/5ML liquid Take 10 mLs (100 mg total) by mouth 2 (two) times daily. 100 mL 0    loratadine (CLARITIN) 10 MG tablet Take 1 tablet (10 mg total) by mouth daily.      MYRBETRIQ 25 MG TB24 tablet Take 25 mg by mouth daily.      polyethylene glycol (MIRALAX / GLYCOLAX) 17 g packet Take 17 g by mouth daily. 14 each 0    warfarin (COUMADIN) 2 MG tablet Take 1 tablet (2 mg total) by mouth one time only at 4 PM.      [DISCONTINUED] ferrous sulfate 325 (65 FE) MG tablet Take 1 tablet (325 mg total) by mouth daily with breakfast.  3    [DISCONTINUED] metoprolol succinate (TOPROL-XL) 25 MG 24  hr tablet Take 0.5 tablets (12.5 mg total) by mouth daily.      [DISCONTINUED] rosuvastatin (CRESTOR) 20 MG tablet Take 1 tablet (20 mg total) by mouth daily. 30 tablet 1    [DISCONTINUED] sacubitril-valsartan (ENTRESTO) 24-26 MG Take 1 tablet by mouth 2 (two) times daily. 60 tablet     Scheduled:   aspirin EC  81 mg Oral Daily   Chlorhexidine Gluconate Cloth  6 each Topical Daily   docusate  100 mg Oral BID   famotidine  20 mg Oral Daily   ferrous sulfate  325 mg Oral Q breakfast   loratadine  10 mg Oral Daily   metoprolol succinate  12.5 mg Oral Daily   nitrofurantoin (macrocrystal-monohydrate)  100 mg Oral Q12H   mouth rinse  15 mL Mouth Rinse 4 times per day   polyethylene glycol  17 g Oral Daily   pregabalin  100 mg Oral BID   rosuvastatin  20 mg Oral Daily   sacubitril-valsartan  1 tablet Oral BID   Warfarin - Pharmacist Dosing Inpatient   Does not apply q1600    Assessment: 55 yoF with admitted for stroke s/p thrombectomy on 9/5. 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  Readmitted with recurrent CVA after 7 days of eliquis with compliance. Last Eliquis dose 9/5 @0800 . Pharmacy consulted for apixaban switch to warfarin with enoxaparin bridge. Stop the Lovenox since INR goal was reach   INR 1.8- subtherapeutic. L groin hematoma is stable. H/H 11's, plt stable.  L. Groin Hematoma is stable. The nurse is going to mark the margins so we can monitor for expansion.    No DDI noted.   Goal of Therapy:  INR 2.5-3  (2.5 per Dr. ) Monitor platelets by anticoagulation protocol: Yes   Plan:  Warfarin 3.5 mg PO today x1 Check INR , CBC daily  F/U hematoma size  Cardiologist noted target INR 2.5 and he plans for patient follow up at coumadin clinic at [facility].    [facility] BS, PharmD, BCPS Clinical Pharmacist 02/06/2022 7:29 AM  Contact: [phone] after 3 PM  "Be curious, not judgmental..." -[phone]

## 2022-02-06 NOTE — Progress Notes (Signed)
Inpatient Rehabilitation Discharge Medication Review by a Pharmacist  A complete drug regimen review was completed for this patient to identify any potential clinically significant medication issues.  High Risk Drug Classes Is patient taking? Indication by Medication  Antipsychotic No   Anticoagulant Yes Warfarin - history of LV thrombus, CVA  Antibiotic Yes Macrobid - UTI, 5 day supply at discharge  Opioid No   Antiplatelet Yes Aspirin - CVA  Hypoglycemics/insulin No   Vasoactive Medication Yes Metoprolol - CHF, HTN Entresto - CHF, HTN  Chemotherapy No   Other Yes Myrbetriq - OAB Crestor - HLD Lyrica - neuropathy Iron - anemia     Type of Medication Issue Identified Description of Issue Recommendation(s)  Drug Interaction(s) (clinically significant)     Duplicate Therapy     Allergy     No Medication Administration End Date     Incorrect Dose     Additional Drug Therapy Needed     Significant med changes from prior encounter (inform family/care partners about these prior to discharge). Eliquis switched to Warfarin during hospital stay. Education completed 02/04/22.   Other       Clinically significant medication issues were identified that warrant physician communication and completion of prescribed/recommended actions by midnight of the next day:  No  Name of provider notified for urgent issues identified:   Provider Method of Notification:    Pharmacist comments: d/w PA warfarin recommendations at discharge. Will send with warfarin 4mg  daily. INR check scheduled for 9/19.  Time spent performing this drug regimen review (minutes):  15  10/19, PharmD, BCPS 02/06/2022 10:11 AM

## 2022-02-06 NOTE — Plan of Care (Signed)
  Problem: RH Swallowing Goal: LTG Patient will consume least restrictive diet using compensatory strategies with assistance (SLP) Description: LTG:  Patient will consume least restrictive diet using compensatory strategies with assistance (SLP) Outcome: Not Met (add Reason) Note: Continues to require intermittent Sup A    Problem: RH Comprehension Communication Goal: LTG Patient will comprehend basic/complex auditory (SLP) Description: LTG: Patient will comprehend basic/complex auditory information with cues (SLP). Outcome: Completed/Met   Problem: RH Expression Communication Goal: LTG Patient will verbally express basic/complex needs(SLP) Description: LTG:  Patient will verbally express basic/complex needs, wants or ideas with cues  (SLP) Outcome: Completed/Met

## 2022-02-06 NOTE — Progress Notes (Addendum)
Patient being DC home tomorrow. Patient has no questions or concerns at this time. 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 Husband at bedside has no concerns at this time as well.  Dan A notified of B/P. Patient has no S/S. Encouraged fluids

## 2022-02-06 NOTE — Progress Notes (Signed)
Occupational Therapy Discharge Summary  Patient Details  Name: Chloe Johnson MRN: [id number] Date of Birth: [birth year]  Date of Discharge from [place] service:February 06, 2022   Patient has met {NUMBERS 0-12:18577} of {NUMBERS 0-12:18577} long term goals due to {due to:3041651}.  Patient to discharge at overall {LOA:3049010} level.  Patient's care partner {care partner:3041650} to provide the necessary {assistance:3041652} assistance at discharge.    Reasons goals not met: ***  Recommendation:  Patient will benefit from ongoing skilled OT services in {setting:3041680} to continue to advance functional skills in the area of {ADL/iADL:3041649}.  Equipment: {equipment:3041657}  Reasons for discharge: discharge from hospital  Patient/family agrees with progress made and goals achieved: Yes  OT Discharge Precautions/Restrictions  Precautions Precautions: Other (comment) Precaution Comments: Expressive aphasia, RUE weakness Restrictions Weight Bearing Restrictions: No General Chart Reviewed: Yes Family/Caregiver Present: Yes Vital Signs Please see "Flowsheet" for most recent vitals charted by nursing staff.  ADL ADL Eating: Minimal assistance Where Assessed-Eating: Chair Grooming: Minimal assistance Where Assessed-Grooming: Standing at sink Upper Body Bathing: Minimal assistance Where Assessed-Upper Body Bathing: Shower Lower Body Bathing: Minimal assistance Where Assessed-Lower Body Bathing: Shower Upper Body Dressing: Minimal assistance Where Assessed-Upper Body Dressing: Standing at sink Lower Body Dressing: Minimal assistance Where Assessed-Lower Body Dressing: Standing at sink Toileting: Minimal assistance Where Assessed-Toileting: Glass blower/designer: Programmer, applications Method: Counselling psychologist: Other (comment) (NA) Tub/Shower Transfer: Unable to assess Tub/Shower Transfer Method: Unable to assess Tub/Shower Equipment: Other (comment)  (Do not anticipate) Social research officer, government: IT consultant Method: Heritage manager:  (NA) Vision   Agricultural consultant Overall Cognitive Status: Within Functional Limits for tasks assessed Arousal/Alertness: Awake/alert Orientation Level: Person;Place;Situation Person: Oriented Place: Oriented Situation: Oriented Memory: Appears intact Focused Attention: Appears intact Sustained Attention: Appears intact Sustained Attention Impairment: Functional complex Selective Attention: Appears intact Awareness: Appears intact Problem Solving: Appears intact Problem Solving Impairment: Functional complex Sequencing: Appears intact Organizing: Appears intact Safety/Judgment: Appears intact Brief Interview for Mental Status (BIMS) Repetition of Three Words (First Attempt): 3 (BIMS completed with intermittent use of communication board secondary to expressive aphasia) Temporal Orientation: Year: Correct Temporal Orientation: Month: Accurate within 5 days Temporal Orientation: Day: Correct Recall: "Sock": No, could not recall Recall: "Blue": Yes, no cue required Recall: "Bed": Yes, no cue required BIMS Summary Score: 13 Sensation   Motor    Mobility     Trunk/Postural Assessment     Balance   Extremity/Trunk Assessment       Chloe Johnson 02/06/2022, 5:56 PM

## 2022-02-06 NOTE — Progress Notes (Signed)
Speech Language Pathology Discharge Summary  Patient Details  Name: TIPHANY FAYSON MRN: [id number] Date of Birth: 06/09/1955  Date of Discharge from [place] service:February 06, 2022  Patient has met 2 of 3 long term goals.  Patient to discharge at overall Mod;Max (for verbal communication) level.  Reasons goals not met: For swallowing, continued to benefit from Sup A question cues to perform lingual sweep to R   Clinical Impression/Discharge Summary:    Due to short ELOS, pt only met 2 out of 3 long-term goals set on initial evaluation, which was completed on 02/05/2022; continues to require intermittent Sup A for implementation of safe swallowing strategies. Nevertheless, pt demonstrates high motivation and much stimulability for skilled ST interventions. Pt and family education completed. Apraxia and aphasia persists, as well as mild dysarthria due to orofacial numbness and weakness s/p CVA. Tolerating regular diet with thin liquids. Recommend supervision and assistance with communication tasks at home, as well as OP ST. Pt and family in agreement and appreciative of education and resources.  Care Partner:  Caregiver Able to Provide Assistance: Yes  Type of Caregiver Assistance: Cognitive  Recommendation:  Outpatient SLP;24 hour supervision/assistance  Rationale for SLP Follow Up: Maximize functional communication   Equipment: N/A   Reasons for discharge: Discharged from hospital   Patient/Family Agrees with Progress Made and Goals Achieved: Yes    Rajat Staver A Eleftheria Taborn 02/06/2022, 1:09 PM

## 2022-02-06 NOTE — Progress Notes (Signed)
PROGRESS NOTE   Subjective/Complaints: Hematoma stable- appreciate nursing noting margins Slept poorly last night- increasing HS Lyrica to 150mg   ROS: +back pain, +insomnia  Objective:   VAS GROIN PSEUDOANEURYSM  Result Date: 02/05/2022  ARTERIAL PSEUDOANEURYSM  Patient Name:  Chloe Johnson  Date of Exam:   02/05/2022 Medical Rec #: 02/07/2022     Accession #:    [id number] Date of Birth: 11/13/1955     Patient Gender: F Patient Age:   66 years Exam Location:  [facility] Procedure:      VAS [facility] [place] Referring Phys: Bobetta Lime --------------------------------------------------------------------------------  Exam: Left groin Indications: Patient complains of bruising and palpable knot. Comparison Study: 02/02/2022 - Summary: No evidence of pseudoaneurysm, AVF or                   DVT. A mixed echogenic structure measuring approximately 1.0                   cm x 0.9 cm is visualized at the left groin with ultrasound                   characteristics of a hematoma. Performing Technologist: 04/04/2022 RVT  Examination Guidelines: A complete evaluation includes B-mode imaging, spectral Doppler, color Doppler, and power Doppler as needed of all accessible portions of each vessel. Bilateral testing is considered an integral part of a complete examination. Limited examinations for reoccurring indications may be performed as noted. +------------+----------+---------+------+----------+ Right DuplexPSV (cm/s)Waveform PlaqueComment(s) +------------+----------+---------+------+----------+ CFA            180    triphasic                 +------------+----------+---------+------+----------+ Prox SFA       127    triphasic                 +------------+----------+---------+------+----------+  Summary: No evidence of pseudoaneurysm. Results are unchanged from the study performed on 02/02/2022.    --------------------------------------------------------------------------------    Preliminary    Recent Labs    02/05/22 1101 02/06/22 0557  WBC 6.5 5.8  HGB 10.8* 11.1*  HCT 33.6* 33.4*  PLT 289 258   Recent Labs    02/05/22 1101  NA 140  K 3.6  CL 108  CO2 26  GLUCOSE 113*  BUN 8  CREATININE 0.68  CALCIUM 9.3    Intake/Output Summary (Last 24 hours) at 02/06/2022 0935 Last data filed at 02/06/2022 0738 Gross per 24 hour  Intake 720 ml  Output --  Net 720 ml        Physical Exam: Vital Signs Blood pressure 118/72, pulse (!) 59, temperature 98.2 F (36.8 C), temperature source Oral, resp. rate 17, height 5' (1.524 m), weight 57 kg, SpO2 100 %. Gen: no distress, normal appearing HEENT: oral mucosa pink and moist, NCAT Cardio: Bradycardic Chest: normal effort, normal rate of breathing Abd: soft, non-distended Ext: no edema Psych: pleasant, normal affect Skin: Clean and intact without signs of breakdown Neuro:  Expressive aphasia, follows commands, able to use communication board, able to say a few words now, able to name 1/4 objects, not  able to repeat, Right facial weakness, decrease sensation reported around her R mouth and tongue, PERRLA, EOMI, Right facial weakness present, vision intact and hearing intact to finger rub, Tongue deviates right, sensation altered in RUE to light touch, normal sensation to LT in LUE, LLE and RLE.  Strength 5/5 in LLE and LUE Strength 5/5 in RLE Strength 4/5 L shoulder abduction, 3/5 elbow flexion and extension, 0/5 wrist extension, 1-2/5 finger flexion  Left FTN Intact Musculoskeletal:  Full ROM, no joint swelling noted, no tone decrease R wrist  Ecchymosis left inguinal area with dressing CDI   Assessment/Plan: 1. Functional deficits which require 3+ hours per day of interdisciplinary therapy in a comprehensive inpatient rehab setting. Physiatrist is providing close team supervision and 24 hour management of active medical  problems listed below. Physiatrist and rehab team continue to assess barriers to discharge/monitor patient progress toward functional and medical goals  Care Tool:  Bathing    Body parts bathed by patient: Right arm, Left arm, Chest, Abdomen, Front perineal area, Buttocks, Face, Left lower leg, Right lower leg, Left upper leg, Right upper leg         Bathing assist Assist Level: Minimal Assistance - Patient > 75%     Upper Body Dressing/Undressing Upper body dressing        Upper body assist Assist Level: Minimal Assistance - Patient > 75%    Lower Body Dressing/Undressing Lower body dressing      What is the patient wearing?: Underwear/pull up, Pants     Lower body assist Assist for lower body dressing: Minimal Assistance - Patient > 75%     Toileting Toileting    Toileting assist Assist for toileting: Minimal Assistance - Patient > 75%     Transfers Chair/bed transfer  Transfers assist     Chair/bed transfer assist level: Independent     Locomotion Ambulation   Ambulation assist      Assist level: Independent Assistive device: No Device Max distance: 500'   Walk 10 feet activity   Assist     Assist level: Independent Assistive device: No Device   Walk 50 feet activity   Assist    Assist level: Independent Assistive device: No Device    Walk 150 feet activity   Assist    Assist level: Independent Assistive device: No Device    Walk 10 feet on uneven surface  activity   Assist     Assist level: Independent     Wheelchair     Assist Is the patient using a wheelchair?: No             Wheelchair 50 feet with 2 turns activity    Assist            Wheelchair 150 feet activity     Assist          Blood pressure 118/72, pulse (!) 59, temperature 98.2 F (36.8 C), temperature source Oral, resp. rate 17, height 5' (1.524 m), weight 57 kg, SpO2 100 %.    Medical Problem List and Plan: 1.  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 Chronic neck and back pain: Increase Lyrica to 100mg  daily, and 150mg  HS 4. Insomnia: replace Xanax with increased dose of Lyrica to help her sleep 5. Neuropsych/cognition: This patient is not capable of making decisions on her own behalf. 6. Skin/Wound Care: Routine skin checks 7. Fluids/Electrolytes/Nutrition: Routine-output follow-up chemistries 8.  UTI.  3 day course of Rocephin. UC positive for staphylococcus epidermis that is pansensitive will transition to Nitrofurantoin. 9.  Essential hypertension.  Entresto 24-26 mg twice daily -well controlled overall, avoid hypotension 10.  Follow congestive heart failure.  Continue Entresto.  Monitor for any signs fluid overload. Daily weights      Filed Weights    02/04/22 1726  Weight: 57 kg    11.  Hyperlipidemia.  Crestor 20mg  daily 12.  UDS positive marijuana.  Provide counseling 13. Constipation             -Continue miralax daily, continue colace 14. Anemia             -Recheck CBC 15. Likely hematoma L groin, VAS completed and shows stable hematoma, continue to note margins, monitor for bleeding  LOS: 2 days A FACE TO FACE EVALUATION WAS PERFORMED  04/06/22 P Jimmie Dattilio 02/06/2022, 9:35 AM

## 2022-02-06 NOTE — Progress Notes (Signed)
Speech Language Pathology Daily Session Note  Patient Details  Name: Chloe Johnson MRN: [id number] Date of Birth: 1955/07/24  Today's Date: 02/06/2022 SLP Individual Time: 0830-0930 SLP Individual Time Calculation (min): 60 min  2nd session: SLP Individual Time: 1120-1205 SLP Individual Time Calculation (min): 45 minutes  Short Term Goals: Week 1: SLP Short Term Goal 1 (Week 1): NA due to ELOS  Skilled Therapeutic Interventions: S: Pt seen this date for skilled ST intervention targeting swallowing and communication goals outlined above. Pt received awake/alert for both sessions; participatory and pleasant throughout. Agreeable to ST intervention in speech office during 1st session and hospital room during second session.  O: SLP facilitated today's session by providing: Session 1 - Mod-Max A, faded to Mod A verbal and visual cues for communication of wants, needs, thoughts, and ideas.  - Sup A to Mod I for use of low-tech AAC alphabet board to facilitate understanding during communication breakdown. - Read at word level with 100% accuracy independently. - Supportive communication interventions to increase verbal expression.  Session 2 - Sup A for consistent implementation of safe swallowing strategies to include use of liquid rinse and lingual sweep to R post-swallow. -  Pt completed reading comprehension at the sentence level with 100% accuracy independently. Unable to complete reading comprehension at the paragraph level. - No clinical s/sx concerning for aspiration were noted with regular textures and thin liquid via straw. Continue to recommend these textures. - Mod A verbal and visual cues for communication of wants, needs, thoughts, and ideas.  - Mod I for use of low-tech AAC alphabet board for communication breakdown. - Pt and family education re: aphasia, apraxia, supportive communication interventions for communication at home with family, and community and electronic based  resources to continue therapeutic work in home environment.  A: Pt appears very sitmulable the following skilled ST interventions: melodic intonation, articulatory placement, visual feedback with use of mirror, sentence completion, supportive communication interventions, and low-tech AAC. Recommend OP ST and 24/7 supervision and assistance at time of discharge; pt and family aware and in agreement.  P: Pt left in room with spouse present during both sessions. Call bell reviewed and within reach and all immediate needs met. Continue per current ST POC next session.   Pain No/Denies; NAD  Therapy/Group: Individual Therapy  Donaldo Teegarden A Jeffry Vogelsang 02/06/2022, 12:42 PM

## 2022-02-07 DIAGNOSIS — I63512 Cerebral infarction due to unspecified occlusion or stenosis of left middle cerebral artery: Secondary | ICD-10-CM | POA: Diagnosis not present

## 2022-02-07 LAB — CBC
HCT: 32.4 % — ABNORMAL LOW (ref 36.0–46.0)
Hemoglobin: 10.8 g/dL — ABNORMAL LOW (ref 12.0–15.0)
MCH: 29.3 pg (ref 26.0–34.0)
MCHC: 33.3 g/dL (ref 30.0–36.0)
MCV: 87.8 fL (ref 80.0–100.0)
Platelets: 247 10*3/uL (ref 150–400)
RBC: 3.69 MIL/uL — ABNORMAL LOW (ref 3.87–5.11)
RDW: 12.1 % (ref 11.5–15.5)
WBC: 4.9 10*3/uL (ref 4.0–10.5)
nRBC: 0 % (ref 0.0–0.2)

## 2022-02-07 LAB — PROTIME-INR
INR: 1.9 — ABNORMAL HIGH (ref 0.8–1.2)
Prothrombin Time: 21.3 seconds — ABNORMAL HIGH (ref 11.4–15.2)

## 2022-02-07 MED ORDER — PREGABALIN 100 MG PO CAPS: ORAL_CAPSULE | ORAL | 0 refills | Status: DC

## 2022-02-07 MED ORDER — PREGABALIN 75 MG PO CAPS: 200.0000 mg | ORAL_CAPSULE | Freq: Every day | ORAL | Status: DC

## 2022-02-07 MED ORDER — SACUBITRIL-VALSARTAN 24-26 MG PO TABS: 1.0000 | ORAL_TABLET | Freq: Every day | ORAL | 0 refills | Status: DC

## 2022-02-07 MED ORDER — WARFARIN SODIUM 4 MG PO TABS
4.0000 mg | ORAL_TABLET | Freq: Once | ORAL | Status: DC
  Filled 2022-02-07: qty 1

## 2022-02-07 MED ORDER — WARFARIN SODIUM 4 MG PO TABS: 4.0000 mg | ORAL_TABLET | Freq: Every day | ORAL | 11 refills | Status: DC

## 2022-02-07 NOTE — Progress Notes (Signed)
Inpatient Rehabilitation Discharge Medication Review by a Pharmacist   A complete drug regimen review was completed for this patient to identify any potential clinically significant medication issues.   High Risk Drug Classes Is patient taking? Indication by Medication  Antipsychotic No    Anticoagulant Yes Warfarin - history of LV thrombus, CVA  Antibiotic Yes Macrobid - UTI, 5 day supply at discharge  Opioid No    Antiplatelet Yes Aspirin - CVA  Hypoglycemics/insulin No    Vasoactive Medication Yes Metoprolol - CHF, HTN Entresto - CHF, HTN  Chemotherapy No    Other Yes Myrbetriq - OAB Crestor - HLD Lyrica - neuropathy Iron - anemia        Type of Medication Issue Identified Description of Issue Recommendation(s)  Drug Interaction(s) (clinically significant)        Duplicate Therapy        Allergy        No Medication Administration End Date        Incorrect Dose        Additional Drug Therapy Needed        Significant med changes from prior encounter (inform family/care partners about these prior to discharge). Eliquis switched to Warfarin during hospital stay. Education completed 02/04/22.    Other            Clinically significant medication issues were identified that warrant physician communication and completion of prescribed/recommended actions by midnight of the next day:  No   Name of provider notified for urgent issues identified:    Provider Method of Notification:        Pharmacist comments:    Time spent performing this drug regimen review (minutes):  15   Alphia Moh, PharmD, [place], [facility] Clinical Pharmacist  Please check AMION for all [facility] phone numbers After 10:00 PM, call Main Pharmacy [phone]

## 2022-02-07 NOTE — Progress Notes (Signed)
ANTICOAGULATION CONSULT NOTE- follow-up  Pharmacy Consult for Warfarin Indication: LV thrombus  Allergies  Allergen Reactions   Doxycycline Nausea Only    malaise    Erythromycin Nausea And Vomiting   Iodine     unknown   Latex Other (See Comments)    Irritation of the skin   Shellfish Allergy Other (See Comments)    GI- Facial  Acne   Strawberry Extract Other (See Comments)    Mouth ulcers    Vancomycin    Vicodin [Hydrocodone-Acetaminophen] Other (See Comments)    Restless, tolerate percocet   Dilaudid [Hydromorphone Hcl] Rash    *Pt states she can take if given benadryl prior*   Keflex [Cephalexin] Rash   Penicillins Rash    Hives   Sulfa Antibiotics Rash    Hives    Patient Measurements: Height: 5' (152.4 cm) Weight: 57 kg (125 lb 10.6 oz) IBW/kg (Calculated) : 45.5  Vital Signs: Temp: 98.2 F (36.8 C) (09/15 0442) Temp Source: Oral (09/15 0442) BP: 115/76 (09/15 0832) Pulse Rate: 80 (09/15 0832)  Labs: Recent Labs    02/05/22 1101 02/06/22 0557  HGB 10.8* 11.1*  HCT 33.6* 33.4*  PLT 289 258  LABPROT 26.6* 21.0*  INR 2.5* 1.8*  CREATININE 0.68  --      Estimated Creatinine Clearance: 56.2 mL/min (by C-G formula based on SCr of 0.68 mg/dL).   Medical History: 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/16/2013   L3-4/notes 11/24/2013, arms, neck   Exertional asthma    GERD (gastroesophageal reflux disease)    Hypertension    Kidney stones    "have always passed them"   Neck pain    Osteomyelitis (HCC) 11/23/2013   osteomyelitis, discitis    Sleep concern    uses Prozac for sleep     Medications:  Medications Prior to Admission  Medication Sig Dispense Refill Last Dose   apixaban (ELIQUIS) 5 MG TABS tablet Take 5 mg by mouth 2 (two) times daily.      acetaminophen (TYLENOL) 325 MG tablet Take 2 tablets (650 mg total) by mouth every 4 (four) hours as needed for mild pain (or temp > 37.5  C (99.5 F)).      aspirin EC 81 MG tablet Take 1 tablet (81 mg total) by mouth daily. Swallow whole. 30 tablet 12    cefTRIAXone 1 g in sodium chloride 0.9 % 100 mL Inject 1 g into the vein daily.      diphenhydrAMINE (BENADRYL) 50 MG/ML injection Inject 0.25 mLs (12.5 mg total) into the vein every 6 (six) hours as needed for itching or allergies.  0    docusate (COLACE) 50 MG/5ML liquid Take 10 mLs (100 mg total) by mouth 2 (two) times daily. 100 mL 0    loratadine (CLARITIN) 10 MG tablet Take 1 tablet (10 mg total) by mouth daily.      MYRBETRIQ 25 MG TB24 tablet Take 25 mg by mouth daily.      polyethylene glycol (MIRALAX / GLYCOLAX) 17 g packet Take 17 g by mouth daily. 14 each 0    warfarin (COUMADIN) 2 MG tablet Take 1 tablet (2 mg total) by mouth one time only at 4 PM.      [DISCONTINUED] ferrous sulfate 325 (65 FE) MG tablet Take 1 tablet (325 mg total) by mouth daily with breakfast.  3    [DISCONTINUED] metoprolol succinate (TOPROL-XL) 25  MG 24 hr tablet Take 0.5 tablets (12.5 mg total) by mouth daily.      [DISCONTINUED] rosuvastatin (CRESTOR) 20 MG tablet Take 1 tablet (20 mg total) by mouth daily. 30 tablet 1    [DISCONTINUED] sacubitril-valsartan (ENTRESTO) 24-26 MG Take 1 tablet by mouth 2 (two) times daily. 60 tablet     Scheduled:   aspirin EC  81 mg Oral Daily   docusate  100 mg Oral BID   famotidine  20 mg Oral Daily   ferrous sulfate  325 mg Oral Q breakfast   loratadine  10 mg Oral Daily   metoprolol succinate  12.5 mg Oral Daily   nitrofurantoin (macrocrystal-monohydrate)  100 mg Oral Q12H   mouth rinse  15 mL Mouth Rinse 4 times per day   polyethylene glycol  17 g Oral Daily   pregabalin  100 mg Oral Daily   pregabalin  150 mg Oral QHS   rosuvastatin  20 mg Oral Daily   sacubitril-valsartan  1 tablet Oral Daily   Warfarin - Pharmacist Dosing Inpatient   Does not apply q1600    Assessment: 86 yoF with admitted for stroke s/p thrombectomy on 9/5. 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  Readmitted with recurrent CVA after 7 days of eliquis with compliance. Last Eliquis dose 9/5 @0800 . Pharmacy consulted for apixaban switch to warfarin with enoxaparin bridge. Lovenox was stopped once INR goal reached.   INR remains subtherapeutic at 1.9. L groin hematoma is stable in size per RN. CBC remains stable. Still finding a stable warfarin dose for patient - has required an average of 4.5mg  since starting warfarin on 9/7.  No DDI noted. Documented consuming 100% of meals.   Goal of Therapy:  INR 2.5-3 (2.5 per Dr. 11/7) Monitor platelets by anticoagulation protocol: Yes   Plan:  Warfarin 4 mg PO today x1 Monitor CBC and INR daily while admitted Monitor hematoma size/improvement Cardiologist noted target INR 2.5 and he plans for patient follow up at coumadin clinic at [facility].   For discharge, recommend warfarin 4 mg daily. Follow-up INR check scheduled for Tuesday, 9/19.   03-13-2002, PharmD, BCPS 02/07/2022 9:46 AM

## 2022-02-07 NOTE — Progress Notes (Addendum)
Patient being DC this afternoon. PT INR to be  drawn per order. Patient requested to see Dr. Carlis Abbott prior to being DC. No other questions or concerns regarding DC at this time.

## 2022-02-07 NOTE — Progress Notes (Signed)
Inpatient Rehabilitation Care Coordinator Discharge Note   Patient Details  Name: Chloe Johnson MRN: [id number] Date of Birth: Sep 14, 1955   Discharge location: HOME WITH HUSBAND WHO CAN TAKE SOME TIME OFF FROM WORK TO PROVIDE SUPERVISION  Length of Stay: 3 DAYS  Discharge activity level: INDEPENDENT LEVEL  Home/community participation: ACTIVE  Patient response VV:KPQAES Literacy - How often do you need to have someone help you when you read instructions, pamphlets, or other written material from your doctor or pharmacy?: Never  Patient response LP:NPYYFR Isolation - How often do you feel lonely or isolated from those around you?: Never  Services provided included: MD, RD, PT, OT, SLP, RN, CM, Pharmacy, SW  Financial Services:  Financial Services Utilized: Medicare    Choices offered to/list presented to: PT AND HUSBAND  Follow-up services arranged:  Outpatient    Outpatient Servicies: [facility]- OT & SP WILL CALL HUSBAND TO SET UP APPOINTMENTS      Patient response to transportation need: Is the patient able to respond to transportation needs?: Yes In the past 12 months, has lack of transportation kept you from medical appointments or from getting medications?: No In the past 12 months, has lack of transportation kept you from meetings, work, or from getting things needed for daily living?: No    Comments (or additional information): PT HIGH LEVEL AND READY FOR OP THERAPIES. HUSBAND WAS HERE AND OBSERVED WIFE IN THERAPIES AND COMFORTABLE WITH DC TODAY  Patient/Family verbalized understanding of follow-up arrangements:  Yes  Individual responsible for coordination of the follow-up plan: [name]-HUSBAND  [phone]  Confirmed correct DME delivered: Lucy Chris 02/07/2022    Aleida Crandell, Lemar Livings

## 2022-02-07 NOTE — IPOC Note (Signed)
Overall Plan of Care [facility]) Patient Details Name: Chloe Johnson MRN: [id number] DOB: 1955-08-28  Admitting Diagnosis: Left middle cerebral artery stroke [facility])  Hospital Problems: Principal Problem:   Left middle cerebral artery stroke [facility])     Functional Problem List: Nursing Bladder, Bowel, Endurance, Pain, Safety, Medication Management  PT Sensory  OT Motor, Sensory  SLP Linguistic, Motor  TR         Basic ADL's: OT Eating, Grooming, Bathing, Dressing, Toileting     Advanced  ADL's: OT       Transfers: PT Other (comment) (Community transfers in unfamiliar environments)  OT       Locomotion: PT Other (comment) (Community mobility)     Additional Impairments: OT Fuctional Use of Upper Extremity  SLP Swallowing, Communication expression    TR      Anticipated Outcomes Item Anticipated Outcome  Self Feeding mod I  Swallowing  Sup A   Basic self-care  mod I  Toileting  mod I   Bathroom Transfers independent  Bowel/Bladder  manage bowel and blader w mod I  Transfers  independent  Locomotion  independent  Communication  Mod A  Cognition  N/A  Pain  < 4 with prns  Safety/Judgment  manage w cues   Therapy Plan: PT Intensity: Minimum of 1-2 x/day ,45 to 90 minutes PT Frequency: 5 out of 7 days PT Duration Estimated Length of Stay: 1-2 days OT Intensity: Minimum of 1-2 x/day, 45 to 90 minutes OT Frequency: 5 out of 7 days OT Duration/Estimated Length of Stay: 3 days SLP Intensity: Minumum of 1-2 x/day, 30 to 90 minutes SLP Frequency: 3 to 5 out of 7 days SLP Duration/Estimated Length of Stay: 2 days   Team Interventions: Nursing Interventions Bladder Management, Disease Management/Prevention, Medication Management, Discharge Planning, Pain Management, Bowel Management, Patient/Family Education  PT interventions Discharge planning, UE/LE Coordination activities, Neuromuscular re-education, Community reintegration  OT Interventions Therapeutic  Exercise, Neuromuscular re-education, Patient/family education, DME/adaptive equipment instruction, Functional mobility training, Self Care/advanced ADL retraining  SLP Interventions Multimodal communication approach, Speech/Language facilitation, Patient/family education, Dysphagia/aspiration precaution training  TR Interventions    SW/CM Interventions Discharge Planning, Psychosocial Support, Patient/Family Education   Barriers to Discharge MD  Medical stability  Nursing Decreased caregiver support, Home environment access/layout 2 level 5 ste left rail, flight upstairs right rail w spouse  PT Lack of/limited family support    OT      SLP      SW Insurance for SNF coverage     Team Discharge Planning: Destination: PT-Home ,OT- Home , SLP-Home Projected Follow-up: PT-None, OT-  Outpatient OT, SLP-Outpatient SLP, 24 hour supervision/assistance Projected Equipment Needs: PT-None recommended by PT, OT-  , SLP-None recommended by SLP Equipment Details: PT- , OT-NA Patient/family involved in discharge planning: PT- Patient, Family member/caregiver,  OT-Patient, Family member/caregiver, SLP-Family member/caregiver  MD ELOS: 3 days Medical Rehab Prognosis:  Excellent Assessment: The patient has been admitted for CIR therapies with the diagnosis of Left MCA CVA. The team will be addressing functional mobility, strength, stamina, balance, safety, adaptive techniques and equipment, self-care, bowel and bladder mgt, patient and caregiver education. Goals have been set at supervision. Anticipated discharge destination is home.        See Team Conference Notes for weekly updates to the plan of care

## 2022-02-07 NOTE — Progress Notes (Addendum)
PROGRESS NOTE   Subjective/Complaints: Ready for d/c today Still sleeping poorly at night, discussed increasing Lyrica to 200mg  at night and she is agreeable  ROS: +back pain, +insomnia, +anxiety  Objective:   VAS GROIN PSEUDOANEURYSM  Result Date: 02/06/2022  ARTERIAL PSEUDOANEURYSM  Patient Name:  Chloe Johnson  Date of Exam:   02/05/2022 Medical Rec #: 02/07/2022     Accession #:    [id number] Date of Birth: 03-27-1956     Patient Gender: F Patient Age:   66 years Exam Location:  [facility] Procedure:      VAS [facility] [place] Referring Phys: Bobetta Lime --------------------------------------------------------------------------------  Exam: Left groin Indications: Patient complains of bruising and palpable knot. Comparison Study: 02/02/2022 - Summary: No evidence of pseudoaneurysm, AVF or                   DVT. A mixed echogenic structure measuring approximately 1.0                   cm x 0.9 cm is visualized at the left groin with ultrasound                   characteristics of a hematoma. Performing Technologist: 04/04/2022 RVT  Examination Guidelines: A complete evaluation includes B-mode imaging, spectral Doppler, color Doppler, and power Doppler as needed of all accessible portions of each vessel. Bilateral testing is considered an integral part of a complete examination. Limited examinations for reoccurring indications may be performed as noted. +------------+----------+---------+------+----------+ Right DuplexPSV (cm/s)Waveform PlaqueComment(s) +------------+----------+---------+------+----------+ CFA            180    triphasic                 +------------+----------+---------+------+----------+ Prox SFA       127    triphasic                 +------------+----------+---------+------+----------+  Summary: No evidence of pseudoaneurysm. Results are unchanged from the study performed on 02/02/2022.  Diagnosing physician: 04/04/2022 Electronically signed by Heath Lark on 02/06/2022 at 4:20:27 PM.   --------------------------------------------------------------------------------    Final    Recent Labs    02/05/22 1101 02/06/22 0557  WBC 6.5 5.8  HGB 10.8* 11.1*  HCT 33.6* 33.4*  PLT 289 258   Recent Labs    02/05/22 1101  NA 140  K 3.6  CL 108  CO2 26  GLUCOSE 113*  BUN 8  CREATININE 0.68  CALCIUM 9.3    Intake/Output Summary (Last 24 hours) at 02/07/2022 1018 Last data filed at 02/07/2022 0746 Gross per 24 hour  Intake 660 ml  Output --  Net 660 ml        Physical Exam: Vital Signs Blood pressure 115/76, pulse 80, temperature 98.2 F (36.8 C), temperature source Oral, resp. rate 16, height 5' (1.524 m), weight 57 kg, SpO2 98 %. Gen: no distress, normal appearing HEENT: oral mucosa pink and moist, NCAT Cardio: Bradycardic Chest: normal effort, normal rate of breathing Abd: soft, non-distended Ext: no edema Psych: pleasant, normal affect Skin: Clean and intact without signs of breakdown Neuro:  Expressive aphasia, follows  commands, able to use communication board, able to say a few words now, able to name 1/4 objects, not able to repeat, Right facial weakness, decrease sensation reported around her R mouth and tongue, PERRLA, EOMI, Right facial weakness present, vision intact and hearing intact to finger rub, Tongue deviates right, sensation altered in RUE to light touch, normal sensation to LT in LUE, LLE and RLE.  Strength 5/5 in LLE and LUE Strength 5/5 in RLE Strength 4/5 L shoulder abduction, 3/5 elbow flexion and extension, 0/5 wrist extension, 1-2/5 finger flexion  Left FTN Intact Impaired praxis Musculoskeletal:  Full ROM, no joint swelling noted, no tone decrease R wrist  Ecchymosis left inguinal area with dressing CDI   Assessment/Plan: 1. Functional deficits which require 3+ hours per day of interdisciplinary therapy in a comprehensive  inpatient rehab setting. Physiatrist is providing close team supervision and 24 hour management of active medical problems listed below. Physiatrist and rehab team continue to assess barriers to discharge/monitor patient progress toward functional and medical goals  Care Tool:  Bathing    Body parts bathed by patient: Right arm, Left arm, Chest, Abdomen, Front perineal area, Buttocks, Face, Left lower leg, Right lower leg, Left upper leg, Right upper leg         Bathing assist Assist Level: Independent     Upper Body Dressing/Undressing Upper body dressing   What is the patient wearing?: Pull over shirt    Upper body assist Assist Level: Independent    Lower Body Dressing/Undressing Lower body dressing      What is the patient wearing?: Pants     Lower body assist Assist for lower body dressing: Independent with assitive device Assistive Device Comment: surface - counter   Toileting Toileting    Toileting assist Assist for toileting: Independent     Transfers Chair/bed transfer  Transfers assist     Chair/bed transfer assist level: Independent     Locomotion Ambulation   Ambulation assist      Assist level: Independent Assistive device: No Device Max distance: 500'   Walk 10 feet activity   Assist     Assist level: Independent Assistive device: No Device   Walk 50 feet activity   Assist    Assist level: Independent Assistive device: No Device    Walk 150 feet activity   Assist    Assist level: Independent Assistive device: No Device    Walk 10 feet on uneven surface  activity   Assist     Assist level: Independent     Wheelchair     Assist Is the patient using a wheelchair?: No             Wheelchair 50 feet with 2 turns activity    Assist            Wheelchair 150 feet activity     Assist          Blood pressure 115/76, pulse 80, temperature 98.2 F (36.8 C), temperature source Oral,  resp. rate 16, height 5' (1.524 m), weight 57 kg, SpO2 98 %.    Medical Problem List and Plan: 1. Functional deficits secondary to left MCA punctate scattered infarct with left M2 occlusion status post IR with revascularization, embolic secondary to LV thrombus recently on Eliquis             -patient may shower             -ELOS/Goals: 3 days, Supervision PT/OT, Min-Mod with SLP  d/c home today 2.  History of LV thrombus on Eliquis: -DVT/anticoagulation:  Pharmaceutical: Coumadin, check INR today             -antiplatelet therapy: Aspirin 81 mg daily 3. Chronic neck and back pain: Increase Lyrica to 100mg  daily, and 250mg  HS 4. Insomnia: replace Xanax with increased dose of Lyrica to help her sleep, discussed use of tart cherry juice at home 5. Neuropsych/cognition: This patient is not capable of making decisions on her own behalf. 6. Skin/Wound Care: Routine skin checks 7. Fluids/Electrolytes/Nutrition: Routine-output follow-up chemistries 8.  UTI.  3 day course of Rocephin. UC positive for staphylococcus epidermis that is pansensitive will transition to Nitrofurantoin. 9.  Essential hypertension.  Entresto 24-26 mg decreased to daily, check weight and BP daily at home to monitor for increase -well controlled overall, avoid hypotension 10.  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  Continue Entresto.  Monitor for any signs fluid overload. Daily weights      Filed Weights    02/04/22 1726  Weight: 57 kg    11.  Hyperlipidemia.  Crestor 20mg  daily 12.  UDS positive marijuana.  Provide counseling 13. Constipation             -Continue miralax daily, continue colace 14. Anemia             -Recheck CBC 15. Likely hematoma L groin, VAS completed and shows stable hematoma, continue to note margins, monitor for bleeding   >30 minutes spent in discharge of patient including review of medications and follow-up appointments, physical examination, and  in answering all patient's questions   LOS: 3 days A FACE TO FACE EVALUATION WAS PERFORMED  Loy Mccartt P Yalena Colon 02/07/2022, 10:18 AM

## 2022-02-11 ENCOUNTER — Other Ambulatory Visit: Payer: Self-pay

## 2022-02-11 ENCOUNTER — Ambulatory Visit: Payer: Federal, State, Local not specified - PPO | Attending: Cardiology

## 2022-02-11 DIAGNOSIS — Z5181 Encounter for therapeutic drug level monitoring: Secondary | ICD-10-CM | POA: Diagnosis not present

## 2022-02-11 DIAGNOSIS — I513 Intracardiac thrombosis, not elsewhere classified: Secondary | ICD-10-CM

## 2022-02-11 DIAGNOSIS — I639 Cerebral infarction, unspecified: Secondary | ICD-10-CM | POA: Diagnosis not present

## 2022-02-11 LAB — POCT INR: INR: 1.3 — AB (ref 2.0–3.0)

## 2022-02-11 NOTE — Patient Instructions (Signed)
TAKE 2 TABLETS TODAY AND 1.5 TABLETS TOMORROW ONLY and then continue 1 tablet Daily. INR 1 WEEK   A full discussion of the nature of anticoagulants has been carried out.  A benefit risk analysis has been presented to the patient, so that they understand the justification for choosing anticoagulation at this time. The need for frequent and regular monitoring, precise dosage adjustment and compliance is stressed.  Side effects of potential bleeding are discussed.  The patient should avoid any OTC items containing aspirin or ibuprofen, and should avoid great swings in general diet.  Avoid alcohol consumption.  Call if any signs of abnormal bleeding.  [phone]

## 2022-02-13 ENCOUNTER — Encounter: Payer: Self-pay | Admitting: Cardiovascular Disease

## 2022-02-13 NOTE — Progress Notes (Signed)
Cardiology Office Note:    Date:  02/14/2022   ID:  PRYCE BUNGERT, DOB 1955/08/17, MRN [id number]  PCP:  Charlane Ferretti, MD   [facility] Providers Cardiologist: Lacye Mccarn    Referring MD: Lavone Orn, MD   Chief Complaint  Patient presents with   Congestive Heart Failure        LV thrombus   Cerebrovascular Accident     History of Present Illness:    Chloe Johnson is a 66 y.o. female with a hx of  ? 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  She was thought to have had an eliquis failure and  She has now been transitioned to coumadin /Lovenox. I actually think the issues was that she just had not been on any anticoagulant long enough to start dissolving the thrombus   Has an expressive aphasia  Improving with rehab. Improving daily   INR followed at [facility]  Last INR is 1.3       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/16/2013   L3-4/notes 11/24/2013, arms, neck   Exertional asthma    GERD (gastroesophageal reflux disease)    Hypertension    Kidney stones    "have always passed them"   Neck pain    Osteomyelitis ([place]) 11/23/2013   osteomyelitis, discitis    Sleep concern    uses Prozac for sleep     Past Surgical History:  Procedure Laterality Date   BREAST CYST EXCISION Left 2010   "polypectomy"   IR CT HEAD LTD  01/21/2022   IR CT HEAD LTD  01/28/2022   IR PERCUTANEOUS ART THROMBECTOMY/INFUSION INTRACRANIAL INC DIAG ANGIO  01/21/2022   IR PERCUTANEOUS ART THROMBECTOMY/INFUSION INTRACRANIAL INC DIAG ANGIO  01/28/2022   IR US GUIDE VASC ACCESS RIGHT  01/22/2022   PICC LINE PLACE PERIPHERAL ([place] HX) Right    for use of Levaquin & Vancomycin, of note: she reports that she had a "flulike feeling"    RADIOLOGY WITH ANESTHESIA N/A  01/21/2022   Procedure: IR WITH ANESTHESIA;  Surgeon: Luanne Bras, MD;  Location: [place];  Service: Radiology;  Laterality: N/A;   RADIOLOGY WITH ANESTHESIA N/A 01/28/2022   Procedure: IR WITH ANESTHESIA;  Surgeon: Radiologist, Medication, MD;  Location: [place];  Service: Radiology;  Laterality: N/A;   TONSILLECTOMY AND ADENOIDECTOMY  1975   TUBAL LIGATION  1999    Current Medications: Current Meds  Medication Sig   acetaminophen (TYLENOL) 325 MG tablet Take 2 tablets (650 mg total) by mouth every 4 (four) hours as needed for mild pain (or temp > 37.5 C (99.5 F)).   aspirin EC 81 MG tablet Take 1 tablet (81 mg total) by mouth daily. Swallow whole.   famotidine (PEPCID) 20 MG tablet Take 1 tablet (20 mg total) by mouth daily. (Patient taking differently: Take 20 mg by mouth 2 (two) times daily.)   ferrous sulfate 325 (65 FE) MG tablet Take 1 tablet (325 mg total) by mouth daily with breakfast.   loratadine (CLARITIN) 10 MG tablet Take 1 tablet (10 mg total) by mouth daily.   metoprolol succinate (TOPROL-XL) 25 MG 24 hr tablet Take 0.5 tablets (12.5 mg total) by mouth daily.   MYRBETRIQ 25 MG TB24 tablet Take 25 mg  by mouth daily.   pregabalin (LYRICA) 100 MG capsule 100 mg daily and 200 mg nightly   rosuvastatin (CRESTOR) 20 MG tablet Take 1 tablet (20 mg total) by mouth daily.   sacubitril-valsartan (ENTRESTO) 24-26 MG Take 1 tablet by mouth daily.   warfarin (COUMADIN) 4 MG tablet Take 1 tablet (4 mg total) by mouth daily.   [DISCONTINUED] potassium chloride (KLOR-CON) 10 MEQ tablet Take 2 tablets by mouth twice daily   [DISCONTINUED] potassium chloride SA (KLOR-CON M) 20 MEQ tablet Take 40 mEq by mouth 2 (two) times daily.     Allergies:   Doxycycline, Erythromycin, Iodine, Latex, Shellfish allergy, Strawberry extract, Vancomycin, Vicodin [hydrocodone-acetaminophen], Dilaudid [hydromorphone hcl], Keflex [cephalexin], Penicillins, and Sulfa antibiotics   Social History    Socioeconomic History   Marital status: Married    Spouse name: Charles   Number of children: 3   Years of education: RN   Highest education level: Not on file  Occupational History    Employer: [employer]   Tobacco Use   Smoking status: Never   Smokeless tobacco: Never  Vaping Use   Vaping Use: Never used  Substance and Sexual Activity   Alcohol use: Yes    Alcohol/week: 6.0 standard drinks of alcohol    Types: 3 Glasses of wine, 3 Shots of liquor per week   Drug use: No   Sexual activity: Yes    Birth control/protection: Post-menopausal  Other Topics Concern   Not on file  Social History Narrative   Patient is married and lives at home with her husband Leonette Most).  Patient works full time as a Char[employer] fundraiser.   Right handed.   College education.   Social Determinants of Health   Financial Resource Strain: Not on file  Food Insecurity: Not on file  Transportation Needs: Not on file  Physical Activity: Not on file  Stress: Not on file  Social Connections: Not on file     Family History: The patient's family history includes Hypertension in her father; Other in her father and mother.  ROS:   Please see the history of present illness.     All other systems reviewed and are negative.  EKGs/Labs/Other Studies Reviewed:    The following studies were reviewed today:   EKG:  Recent Labs: 01/30/2022: TSH 1.712 02/03/2022: Magnesium 2.1 02/05/2022: ALT 18; BUN 8; Creatinine, Ser 0.68; Potassium 3.6; Sodium 140 02/07/2022: Hemoglobin 10.8; Platelets 247  Recent Lipid Panel    Component Value Date/Time   CHOL 223 (H) 01/22/2022 0351   TRIG 271 (H) 01/29/2022 0300   HDL 40 (L) 01/22/2022 0351   CHOLHDL 5.6 01/22/2022 0351   VLDL 33 01/22/2022 0351   LDLCALC 150 (H) 01/22/2022 0351     Risk Assessment/Calculations:                Physical Exam:    VS:  BP 102/70   Pulse 61   Ht 5' (1.524 m)   Wt 128 lb 9.6 oz (58.3 kg)   SpO2 99%   BMI 25.12 kg/m      Wt Readings from Last 3 Encounters:  02/14/22 128 lb 9.6 oz (58.3 kg)  02/05/22 125 lb 10.6 oz (57 kg)  01/28/22 130 lb 1.1 oz (59 kg)     GEN:  Well nourished, well developed in no acute distress HEENT: Normal NECK: No JVD; No carotid bruits LYMPHATICS: No lymphadenopathy CARDIAC: RRR, no murmurs, rubs, gallops RESPIRATORY:  Clear to auscultation without rales, wheezing or rhonchi  ABDOMEN: Soft, non-tender, non-distended MUSCULOSKELETAL:  No edema; No deformity  SKIN: Warm and dry NEUROLOGIC:  expressive aphasia ( improving ) some facial asymetry,   R arm still is weak and has limited function  PSYCHIATRIC:  Normal affect   ASSESSMENT:    1. LV (left ventricular) mural thrombus   2. Cerebrovascular accident (CVA), unspecified mechanism ([place])   3. Medication management   4. Primary hypertension   5. Mixed hyperlipidemia    PLAN:    In order of problems listed above:   Chronic systolic CHF :  only tolerates the Entresto 24-26 QD ( not BID.) We discussed possibly changing to Valsartan  She will cont as she is for now  She will send [place] BP readings for the next 10 days    I'll see her in 4 weeks Echo 3 days prior  to OV    2. LV thrombus:  on coumadin   3.  Stroke:  steady improvement .   Cont current plans                Medication Adjustments/Labs and Tests Ordered: Current medicines are reviewed at length with the patient today.  Concerns regarding medicines are outlined above.  Orders Placed This Encounter  Procedures   Basic metabolic panel   Meds ordered this encounter  Medications   DISCONTD: potassium chloride (KLOR-CON) 10 MEQ tablet    Sig: Take 2 tablets by mouth twice daily    Dispense:  360 tablet    Refill:  0   potassium chloride (KLOR-CON) 10 MEQ tablet    Sig: Take 2 tablets by mouth twice daily    Dispense:  360 tablet    Refill:  2    Please put on file    Patient Instructions  Medication Instructions:  TAKE Potassium  Chloride 105mEq: 2 tablets twice daily *If you need a refill on your cardiac medications before your next appointment, please call your pharmacy*   Lab Work: BMET on Monday If you have labs (blood work) drawn today and your tests are completely normal, you will receive your results only by: MyChart Message (if you have MyChart) OR A paper copy in the mail If you have any lab test that is abnormal or we need to change your treatment, we will call you to review the results.   Testing/Procedures: NONE   Follow-Up: At [facility], you and your health needs are our priority.  As part of our continuing mission to provide you with exceptional heart care, we have created designated Provider Care Teams.  These Care Teams include your primary Cardiologist (physician) and Advanced Practice Providers (APPs -  Physician Assistants and Nurse Practitioners) who all work together to provide you with the care you need, when you need it.  Your next appointment:   03/10/2022 @ 9:40am  The format for your next appointment:   In Person  Provider:   Mertie Moores, MD    Important Information About Sugar         Signed, Mertie Moores, MD  02/14/2022 5:53 PM    [place]

## 2022-02-14 ENCOUNTER — Ambulatory Visit: Payer: Federal, State, Local not specified - PPO | Attending: Cardiovascular Disease | Admitting: Cardiovascular Disease

## 2022-02-14 ENCOUNTER — Encounter: Payer: Self-pay | Admitting: Cardiovascular Disease

## 2022-02-14 VITALS — BP 102/70 | HR 61 | Ht 60.0 in | Wt 128.6 lb

## 2022-02-14 DIAGNOSIS — I10 Essential (primary) hypertension: Secondary | ICD-10-CM | POA: Diagnosis not present

## 2022-02-14 DIAGNOSIS — I639 Cerebral infarction, unspecified: Secondary | ICD-10-CM | POA: Diagnosis not present

## 2022-02-14 DIAGNOSIS — I513 Intracardiac thrombosis, not elsewhere classified: Secondary | ICD-10-CM

## 2022-02-14 DIAGNOSIS — Z79899 Other long term (current) drug therapy: Secondary | ICD-10-CM

## 2022-02-14 DIAGNOSIS — E782 Mixed hyperlipidemia: Secondary | ICD-10-CM

## 2022-02-14 MED ORDER — POTASSIUM CHLORIDE ER 10 MEQ PO TBCR: EXTENDED_RELEASE_TABLET | ORAL | 0 refills | Status: DC

## 2022-02-14 MED ORDER — POTASSIUM CHLORIDE ER 10 MEQ PO TBCR: EXTENDED_RELEASE_TABLET | ORAL | 2 refills | Status: DC

## 2022-02-14 NOTE — Patient Instructions (Signed)
Medication Instructions:  TAKE Potassium Chloride 74mEq: 2 tablets twice daily *If you need a refill on your cardiac medications before your next appointment, please call your pharmacy*   Lab Work: BMET on Monday If you have labs (blood work) drawn today and your tests are completely normal, you will receive your results only by: [place] (if you have MyChart) OR A paper copy in the mail If you have any lab test that is abnormal or we need to change your treatment, we will call you to review the results.   Testing/Procedures: NONE   Follow-Up: At [facility], you and your health needs are our priority.  As part of our continuing mission to provide you with exceptional heart care, we have created designated Provider Care Teams.  These Care Teams include your primary Cardiologist (physician) and Advanced Practice Providers (APPs -  Physician Assistants and Nurse Practitioners) who all work together to provide you with the care you need, when you need it.  Your next appointment:   03/10/2022 @ 9:40am  The format for your next appointment:   In Person  Provider:   Mertie Moores, MD    Important Information About Sugar

## 2022-02-17 ENCOUNTER — Ambulatory Visit: Payer: Federal, State, Local not specified - PPO | Attending: Cardiovascular Disease

## 2022-02-17 DIAGNOSIS — I639 Cerebral infarction, unspecified: Secondary | ICD-10-CM

## 2022-02-17 DIAGNOSIS — I513 Intracardiac thrombosis, not elsewhere classified: Secondary | ICD-10-CM

## 2022-02-17 DIAGNOSIS — Z79899 Other long term (current) drug therapy: Secondary | ICD-10-CM

## 2022-02-18 LAB — BASIC METABOLIC PANEL
BUN/Creatinine Ratio: 14 (ref 12–28)
BUN: 9 mg/dL (ref 8–27)
CO2: 20 mmol/L (ref 20–29)
Calcium: 9.8 mg/dL (ref 8.7–10.3)
Chloride: 108 mmol/L — ABNORMAL HIGH (ref 96–106)
Creatinine, Ser: 0.66 mg/dL (ref 0.57–1.00)
Glucose: 90 mg/dL (ref 70–99)
Potassium: 4 mmol/L (ref 3.5–5.2)
Sodium: 142 mmol/L (ref 134–144)
eGFR: 98 mL/min/{1.73_m2} (ref >59)

## 2022-02-19 ENCOUNTER — Ambulatory Visit: Payer: Federal, State, Local not specified - PPO | Attending: Cardiovascular Disease | Admitting: *Deleted

## 2022-02-19 DIAGNOSIS — I513 Intracardiac thrombosis, not elsewhere classified: Secondary | ICD-10-CM | POA: Diagnosis not present

## 2022-02-19 DIAGNOSIS — I639 Cerebral infarction, unspecified: Secondary | ICD-10-CM

## 2022-02-19 LAB — POCT INR: INR: 2.5 (ref 2.0–3.0)

## 2022-02-19 NOTE — Patient Instructions (Signed)
Description   Today take 1.5 tablets then start taking Warfarin 1 tablet daily except 1.5 tablets on Sundays. Recheck in INR 1 Week. Anticoagulation Clinic [phone]

## 2022-02-20 ENCOUNTER — Encounter: Payer: Federal, State, Local not specified - PPO | Attending: Registered Nurse | Admitting: Registered Nurse

## 2022-02-20 ENCOUNTER — Encounter: Payer: Self-pay | Admitting: Registered Nurse

## 2022-02-20 VITALS — BP 93/62 | HR 74 | Ht 60.0 in | Wt 132.0 lb

## 2022-02-20 DIAGNOSIS — I63512 Cerebral infarction due to unspecified occlusion or stenosis of left middle cerebral artery: Secondary | ICD-10-CM | POA: Diagnosis not present

## 2022-02-20 DIAGNOSIS — I10 Essential (primary) hypertension: Secondary | ICD-10-CM | POA: Diagnosis not present

## 2022-02-20 DIAGNOSIS — I513 Intracardiac thrombosis, not elsewhere classified: Secondary | ICD-10-CM | POA: Diagnosis not present

## 2022-02-20 DIAGNOSIS — E7849 Other hyperlipidemia: Secondary | ICD-10-CM | POA: Insufficient documentation

## 2022-02-20 NOTE — Progress Notes (Signed)
Subjective:    Patient ID: Chloe Johnson, female    DOB: Mar 14, 1956, 66 y.o.   MRN: [id number]  HPI: Chloe Johnson is a 66 y.o. female who is here for HFU appointment for follow up of her Left Middle Cerebral Artery Stroke, Left Ventricular Thrombus and Essential Hypertension.   Dr. Iver Nestle H&P on 01/28/2022 HPI: Chloe Johnson is a 66 y.o. 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 Amada Jupiter, with key details confirmed by husband Chloe Johnson  is a 36 y.o.  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 She presents today after the following series events.  Around 12 PM this past Saturday, the patient noted sudden onset dizziness described as "vertigo" followed by nausea then followed by an episode of vomiting.  She next felt a sudden onset sharp chest pain with radiation to her back.  She assumed it was her cervical radiculopathy "acting up" because shortly after this event, she was holding 2 plates in her hand to feed her cats when she suddenly had no control of both upper extremities and dropped said plates.  On Sunday, she felt generally weak but then improved from all of this until this morning, around 9 AM, when she experienced word finding difficulties.  Her husband, who is at bedside, also notes she had a right facial droop transiently that lasted no more than a few minutes.  She may have had 2 subsequent episodes, as documented by ED  physician; however, all of her is told by the patient's husband that this was 1 single episode."   Her hospital course was noted for acute worsening for which she underwent thrombectomy and was discharged with only some very mild residual aphasia not detectable on examination but noticed by the patient per husband.  He notes that she has been in her normal state of health since discharge other than a visit to the ED on 9/3 with left shoulder pain which was attributed to her known cervical stenosis for which she is planned for surgery in the near future   Husband reports that she was taking his blood pressure when suddenly the blood pressure monitor fell off the table.  She tried to catch it but lost her balance had weakness in both of her legs.  Initially she got to both both arms but then became weak on the right side.  She was unable to speak at all suddenly during this event.  Work-up revealed a left ventricle apical thrombus.   Has been reports she has not missed any of her doses of medication including Eliquis   CTA was attempted to confirm recurrent acute occlusion, however due to IV infiltration and severity of her symptoms decision was made to proceed directly to IR which was discussed both with [name] as well as with the patient's husband   MR Brain:  IMPRESSION: 1. 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 2. No intracranial large vessel occlusion or significant stenosis. In particular, left MCA branches appear perfused and without focal stenosis.  MRA:  IMPRESSION:  1. 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 2. No intracranial large vessel occlusion or significant stenosis. In particular, left MCA branches appear perfused and without focal stenosis.  Dr. Treasa School COURSE Stroke:  left MCA punctate scattered infarct with left M2 occlusion s/p IR with [id number], embolic secondary to LV thrombus  recently on Eliquis CT no acute abnormality IR left superior M2 occlusion, inferior M3/M4 nonocclusive thrombus Status post EVT with TICI2c MRI left insular cortex and frontal lobe punctate infarcts MRA unremarkable 2D Echo EF 45 to 50% on 8/29, LV regional motion abnormality with LV thrombus LDL 150 on 8/29 HgbA1c 5 0 on 8/29 UDS positive for THC Hypercoagulable and autoimmune work up negative Heparin IV for VTE prophylaxis aspirin 81 mg daily and Eliquis (apixaban) daily prior to admission,  now on coumadin with lovenox bridge. INR goal 2.5 Continue ASA 81 per cardiology. Ongoing aggressive stroke risk factor management Therapy recommendations:  CIR Disposition:  pending   Hx of stroke and LV thrombus 8/29 admitted for nausea vomiting dizziness chest pain and then developed right facial droop and aphasia.  Found to have troponin > 300, EF 45 to 50% with LV thrombus.  CTA coronary artery showed minimal CAD.  CT no acute abnormality.  CTA head and neck showed left M2 occlusion.  CTP 0/7.  Status post EVT with TICI2c.  MRI showed left frontal [place] matter punctate infarct.  Repeat MRI showed left external capsule, insular cortex and frontal cortex punctate infarcts.  LDL 150, A1c 5.0.  UDS positive for THC.  Patient was put on heparin IV and aspirin and eventually discharged on Eliquis and aspirin as well as Crestor 20. Per family, patient has slight expressive aphasia since discharge, no physical deficit. Cardiology Dr. Johnsie Cancel on board, recommend coumadin with INR goal 2.5. on lovenox bridge.    Chloe Johnson was admitted to inpatient rehabilitation on 02/04/2022 and discharged home on 02/07/2022. She will begin outpatient Therapy on 02/25/2022 at [facility]. She states her pain is located in her neck and lower back pain. She rates her pain 4.  Chloe Johnson has expressive aphasia.  She was positive with THC, she reports she uses CBD products for anxiety, this was discussed  with Dr Ranell Patrick. Dr Ranell Patrick stated she can continue with her CBD product, she verbalizes understanding.   Husband in room.   Pain Inventory Average Pain 4 Pain Right Now 4 My pain is constant and aching  LOCATION OF PAIN  Neck,Wrist,Hand,Back  BOWEL Number of stools per week: 14  Type of laxative Super Cleanse  BLADDER Normal    Mobility walk without assistance how many minutes can you walk? As needed ability to climb steps?  yes do you drive?  no Do you have any goals in this area?  yes  Function employed # of hrs/week 70  Neuro/Psych bowel control problems weakness numbness anxiety  Prior Studies Any changes since last visit?  no  Physicians involved in your care Any changes since last visit?  no   Family History  Problem Relation Age of Onset   Other Mother    Other Father    Hypertension Father    Social History   Socioeconomic History   Marital status: Married    Spouse name: Chloe Johnson   Number of children: 3   Years of education: RN   Highest education level: Not on file  Occupational History    Employer: [employer]   Tobacco  Use   Smoking status: Never   Smokeless tobacco: Never  Vaping Use   Vaping Use: Never used  Substance and Sexual Activity   Alcohol use: Yes    Alcohol/week: 6.0 standard drinks of alcohol    Types: 3 Glasses of wine, 3 Shots of liquor per week   Drug use: No   Sexual activity: Yes    Birth control/protection: Post-menopausal  Other Topics Concern   Not on file  Social History Narrative   Patient is married and lives at home with her husband Chloe Johnson).  Patient works full time as a [employer] fundraiser.   Right handed.   College education.   Social Determinants of Health   Financial Resource Strain: Not on file  Food Insecurity: Not on file  Transportation Needs: Not on file  Physical Activity: Not on file  Stress: Not on file  Social Connections: Not on file   Past Surgical History:  Procedure Laterality Date    BREAST CYST EXCISION Left 2010   "polypectomy"   IR CT HEAD LTD  01/21/2022   IR CT HEAD LTD  01/28/2022   IR PERCUTANEOUS ART THROMBECTOMY/INFUSION INTRACRANIAL INC DIAG ANGIO  01/21/2022   IR PERCUTANEOUS ART THROMBECTOMY/INFUSION INTRACRANIAL INC DIAG ANGIO  01/28/2022   IR US GUIDE VASC ACCESS RIGHT  01/22/2022   PICC LINE PLACE PERIPHERAL ([facility] HX) Right    for use of Levaquin & Vancomycin, of note: she reports that she had a "flulike feeling"    RADIOLOGY WITH ANESTHESIA N/A 01/21/2022   Procedure: IR WITH ANESTHESIA;  Surgeon: Julieanne Cotton, MD;  Location: [facility];  Service: Radiology;  Laterality: N/A;   RADIOLOGY WITH ANESTHESIA N/A 01/28/2022   Procedure: IR WITH ANESTHESIA;  Surgeon: Radiologist, Medication, MD;  Location: [facility] OR;  Service: Radiology;  Laterality: N/A;   TONSILLECTOMY AND ADENOIDECTOMY  1975   TUBAL LIGATION  1999   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/16/2013   L3-4/notes 11/24/2013, arms, neck   Exertional asthma    GERD (gastroesophageal reflux disease)    Hypertension    Kidney stones    "have always passed them"   Neck pain    Osteomyelitis ([facility]) 11/23/2013   osteomyelitis, discitis    Sleep concern    uses Prozac for sleep    BP 93/62   Pulse 74   Ht 5' (1.524 m)   Wt 132 lb (59.9 kg)   SpO2 96%   BMI 25.78 kg/m   Opioid Risk Score:   Fall Risk Score:  `1  Depression screen [facility] 2/9     02/20/2022    9:22 AM 02/15/2014   11:06 AM 01/18/2014    2:15 PM 12/23/2013   10:14 AM  Depression screen PHQ 2/9  Decreased Interest 0 0 0 0  Down, Depressed, Hopeless 0 1 0 0  PHQ - 2 Score 0 1 0 0  Altered sleeping 0     Tired, decreased energy 1     Change in appetite 0     Feeling bad or failure about yourself  0     Trouble concentrating 0     Moving slowly or fidgety/restless 0     Suicidal thoughts 0     PHQ-9 Score 1     Difficult doing work/chores Not difficult at all          Review of Systems  Musculoskeletal:  Positive for back pain  and neck pain.       Wrist and hand pain   All other systems reviewed and are negative.     Objective:   Physical Exam Vitals and nursing note reviewed.  Constitutional:      Appearance: Normal appearance.  Cardiovascular:     Rate and Rhythm: Normal rate and regular rhythm.     Pulses: Normal pulses.     Heart sounds: Normal heart sounds.  Pulmonary:     Effort: Pulmonary effort is normal.     Breath sounds: Normal breath sounds.  Musculoskeletal:     Cervical back: Normal range of motion and neck supple.     Comments: Normal Muscle Bulk and Muscle Testing Reveals:  Upper Extremities: Right: Upper Extremity: Decreased ROM 90 Degrees  and Muscle Strength 4/5 Left Full ROM and Muscle Strength 5/5  Lower Extremities: Full ROM and Muscle Strength 5/5 Arises from Table with ease Narrow Based Gait     Skin:    General: Skin is warm and dry.  Neurological:     Mental Status: She is alert and oriented to person, place, and time.  Psychiatric:        Mood and Affect: Mood normal.        Behavior: Behavior normal.         Assessment & Plan:  Left Middle Cerebral Artery Stroke: Continue current medication regimen. Has a scheduled appointment with Neurology. She will begin outpatient therapy at [facility]. Continue to Monitor.  Left Ventricular Thrombus: Continue current medication regimen. Cardiology Following.  Essential Hypertension. Continue current medication regimen. PCP Following. Continue to Monitor.  F/U with [name] in 4- 6 weeks

## 2022-02-21 ENCOUNTER — Encounter: Payer: Self-pay | Admitting: Registered Nurse

## 2022-02-25 ENCOUNTER — Other Ambulatory Visit: Payer: Self-pay

## 2022-02-25 ENCOUNTER — Ambulatory Visit
Payer: Federal, State, Local not specified - PPO | Attending: Physical Medicine and Rehabilitation | Admitting: Occupational Therapy

## 2022-02-25 ENCOUNTER — Ambulatory Visit: Payer: Federal, State, Local not specified - PPO

## 2022-02-25 DIAGNOSIS — R1310 Dysphagia, unspecified: Secondary | ICD-10-CM

## 2022-02-25 DIAGNOSIS — R29898 Other symptoms and signs involving the musculoskeletal system: Secondary | ICD-10-CM | POA: Insufficient documentation

## 2022-02-25 DIAGNOSIS — I69351 Hemiplegia and hemiparesis following cerebral infarction affecting right dominant side: Secondary | ICD-10-CM | POA: Diagnosis not present

## 2022-02-25 DIAGNOSIS — R482 Apraxia: Secondary | ICD-10-CM | POA: Insufficient documentation

## 2022-02-25 DIAGNOSIS — R278 Other lack of coordination: Secondary | ICD-10-CM | POA: Diagnosis not present

## 2022-02-25 DIAGNOSIS — R41841 Cognitive communication deficit: Secondary | ICD-10-CM

## 2022-02-25 DIAGNOSIS — R208 Other disturbances of skin sensation: Secondary | ICD-10-CM | POA: Insufficient documentation

## 2022-02-25 DIAGNOSIS — M6281 Muscle weakness (generalized): Secondary | ICD-10-CM | POA: Insufficient documentation

## 2022-02-25 DIAGNOSIS — R2689 Other abnormalities of gait and mobility: Secondary | ICD-10-CM | POA: Insufficient documentation

## 2022-02-25 DIAGNOSIS — R4701 Aphasia: Secondary | ICD-10-CM | POA: Diagnosis not present

## 2022-02-25 DIAGNOSIS — R471 Dysarthria and anarthria: Secondary | ICD-10-CM
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
## 2022-02-25 NOTE — Patient Instructions (Signed)
   Speech Exercises  Repeat each phrase 2-3 times, at least 2 times a day  Call the cat "Buttercup" A calendar of [place], [place] Four floors to cover Yellow oil ointment Fellow lovers of felines Catastrophe in [place]' plums The church's chimes chimed Telling time 'til eleven Five valve levers Keep the gate closed Go see that guy Fat cows give milk [employer] Gophers Fat frogs flip freely Kohl's into bed Get that game to [employer] Thick thistles stick together Cinnamon aluminum linoleum Black bugs blood Lovely lemon linament Red leather, yellow leather  Big grocery buggy    Purple baby carriage [facility] Proper copper coffee pot Ripe purple cabbage Three free throws [employer] tackled  [employer] dipped the dessert  [facility] that [employer] of [place] Shirts shrink, shells shouldn't [place] 49ers Take the tackle box File the flash message Give me five flapjacks Fundamental relatives Dye the pets purple Talking [place] time after time Dark chocolate chunks Political landscape of the kingdom Estate manager/land agent genius We played yo-yos yesterday

## 2022-02-25 NOTE — Therapy (Signed)
OUTPATIENT PHYSICAL THERAPY NEURO EVALUATION   Patient Name: Chloe Johnson MRN: [id number] DOB:[birth year], 66 y.o., female Today's Date: 02/25/2022   PCP: Horton Chin, MD  REFERRING PROVIDER:   PT End of Session - 02/25/22 1057     Visit Number 1    Authorization Type BCBS    Authorization - Visit Number 27    Authorization - Number of Visits 75    PT Start Time 1100    PT Stop Time 1145    PT Time Calculation (min) 45 min             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/16/2013   L3-4/notes 11/24/2013, arms, neck   Exertional asthma    GERD (gastroesophageal reflux disease)    Hypertension    Kidney stones    "have always passed them"   Neck pain    Osteomyelitis (HCC) 11/23/2013   osteomyelitis, discitis    Sleep concern    uses Prozac for sleep    Past Surgical History:  Procedure Laterality Date   BREAST CYST EXCISION Left 2010   "polypectomy"   IR CT HEAD LTD  01/21/2022   IR CT HEAD LTD  01/28/2022   IR PERCUTANEOUS ART THROMBECTOMY/INFUSION INTRACRANIAL INC DIAG ANGIO  01/21/2022   IR PERCUTANEOUS ART THROMBECTOMY/INFUSION INTRACRANIAL INC DIAG ANGIO  01/28/2022   IR US GUIDE VASC ACCESS RIGHT  01/22/2022   PICC LINE PLACE PERIPHERAL ([facility] HX) Right    for use of Levaquin & Vancomycin, of note: she reports that she had a "flulike feeling"    RADIOLOGY WITH ANESTHESIA N/A 01/21/2022   Procedure: IR WITH ANESTHESIA;  Surgeon: Julieanne Cotton, MD;  Location: [facility];  Service: Radiology;  Laterality: N/A;   RADIOLOGY WITH ANESTHESIA N/A 01/28/2022   Procedure: IR WITH ANESTHESIA;  Surgeon: Radiologist, Medication, MD;  Location: [facility];  Service: Radiology;  Laterality: N/A;   TONSILLECTOMY AND ADENOIDECTOMY  1975   TUBAL LIGATION  1999   Patient Active Problem List   Diagnosis Date Noted   UTI (urinary tract infection) 02/04/2022   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   Stroke  determined by clinical assessment (HCC) 01/28/2022   Middle cerebral artery embolism, left 01/28/2022   H/O ischemic left MCA stroke    Acute respiratory failure (HCC)    CVA (cerebral vascular accident) (HCC) 01/21/2022   NSTEMI (non-ST elevated myocardial infarction) (HCC) 01/21/2022   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   HFrEF (heart failure with reduced ejection fraction) (HCC) 01/21/2022   Hyperlipidemia 01/21/2022   Lumbar stenosis 01/21/2022   Stroke (cerebrum) (HCC) 01/21/2022   S/P lumbar spinal fusion 11/15/2014   Lumbar discitis 11/29/2013   Discitis 11/24/2013   Diarrhea 11/17/2013   Osteomyelitis of lumbar spine (HCC) 11/16/2013   Hypertension    Asthma    Anxiety     ONSET DATE: 02/04/22  REFERRING DIAG: I63.9 (ICD-10-CM) - Cerebral infarction, unspecified   THERAPY DIAG:  Muscle weakness (generalized)  Other symptoms and signs involving the musculoskeletal system  Rationale for Evaluation and Treatment Rehabilitation  SUBJECTIVE:  SUBJECTIVE STATEMENT: Left CVA 02/04/22 with primary deficits affecting the RUE. Pt reports hx of chronic neck pain and some impingement affecting the LUE and some discomfort with neck movements and was scheduled for cervical fusion prior to this. Pt also notes chronic LBP and hx of fusion.   Pt accompanied by: significant other  PERTINENT HISTORY: see PMH  PAIN:  Are you having pain? Yes: NPRS scale: 4/10 Pain location: neck and low back Pain description: aching, sharp,  Aggravating factors: certain movements Relieving factors: dry needling, massage, modalities.  Pt reports this is her typical baseline pain/discomfort in neck/back  PRECAUTIONS: None  WEIGHT BEARING RESTRICTIONS No  FALLS: Has patient fallen in last 6 months?  No  LIVING ENVIRONMENT: Lives with: lives with their spouse Lives in: House/apartment Stairs: Yes: Internal: flights steps; on right going up and External: 4-5 steps; on left going up Has following equipment at home: None  PLOF: Independent  PATIENT GOALS regain RUE function  OBJECTIVE:   DIAGNOSTIC FINDINGS: imaging  COGNITION: Overall cognitive status: Within functional limits for tasks assessed, notes some difficulty with reading complex materials   SENSATION: Light touch: Impaired  RUE/RLE (slight for LE)  COORDINATION: WNL for BLE  EDEMA:    MUSCLE TONE: LE muscle tone WNL. RUE demo 1+ to 2 Modified [place]     DTRs:  DNT  POSTURE: No Significant postural limitations  LOWER EXTREMITY ROM:     WNL LOWER EXTREMITY MMT:    BLE 5/5  BED MOBILITY:  independent  TRANSFERS: Assistive device utilized: None  Sit to stand: Complete Independence Stand to sit: Complete Independence Chair to chair: Complete Independence Floor:  DNT  CURB:  Level of Assistance: Complete Independence Assistive device utilized: None Curb Comments:   STAIRS:  Level of Assistance: Complete Independence  Stair Negotiation Technique: Alternating Pattern  with No Rails  Number of Stairs: 15   Height of Stairs: 4-6  Comments:   GAIT: Gait pattern:  normal except for lack of reciprocal arm swing RUE Distance walked: no issue Assistive device utilized: None Level of assistance: Complete Independence Comments:   FUNCTIONAL TESTs:  5 times sit to stand: 5.18 sec Berg Balance Scale: 56/56 Dynamic Gait Index: 24/24 Functional Gait Assessment: 30/30  M-CTSIB  Condition 1: Firm Surface, EO 30 Sec, Normal Sway  Condition 2: Firm Surface, EC 30 Sec, Normal Sway  Condition 3: Foam Surface, EO 30 Sec, Normal Sway  Condition 4: Foam Surface, EC 30 Sec, Normal Sway    PATIENT SURVEYS:  FOTO DNT  TODAY'S TREATMENT:  evaluation   PATIENT EDUCATION: Education details:  regarding assessment findings and low fall risk per standardized tests Person educated: Patient and Spouse Education method: Explanation Education comprehension: verbalized understanding   HOME EXERCISE PROGRAM: N/A    GOALS: Not indicated at this time, no further POC needed ASSESSMENT:  CLINICAL IMPRESSION: Patient is a 66 y.o. lady who was seen today for physical therapy evaluation and treatment for effects following left MCA stroke/emoblus. Pt demonstrates more RUE involvement than LE and demonstrates no functional mobility deficits at present and is able to ambulate pre her typical habit and manifest low risk for falls per standardized balance and fall risk assessment tests.  Pt is receiving OT services at this time which will address her UE deficits and activity limitations as such. No PT services indicated at this time.    OBJECTIVE IMPAIRMENTS impaired UE functional use.   ACTIVITY LIMITATIONS carrying, lifting, self feeding, reach over head, and hygiene/grooming  PARTICIPATION LIMITATIONS: meal prep, cleaning, laundry, driving, shopping, community activity, and occupation  PERSONAL FACTORS 1 comorbidity: comorbid medical and musculoskeletal issues  are also affecting patient's functional outcome.   REHAB POTENTIAL: N/A for PT  CLINICAL DECISION MAKING: Stable/uncomplicated  EVALUATION COMPLEXITY: Low  PLAN: PT FREQUENCY: one time visit  PT DURATION: other: N/A  PLANNED INTERVENTIONS: Evaluation  PLAN FOR NEXT SESSION: n/a for PT    12:31 PM, 02/25/22 M. Shary Decamp, PT, DPT Physical Therapist- [place] Office Number: [phone]

## 2022-02-25 NOTE — Therapy (Signed)
OUTPATIENT OCCUPATIONAL THERAPY NEURO EVALUATION  Patient Name: Chloe Johnson MRN: [id number] DOB:02/20/1956, 66 y.o., female Today's Date: 02/25/2022  PCP: Thana Ates, MD REFERRING PROVIDER: Horton Chin, MD    OT End of Session - 02/25/22 1229     Visit Number 1    Number of Visits 17    Date for OT Re-Evaluation 04/25/22    Authorization Type [employer]    OT Start Time 1019    OT Stop Time 1103    OT Time Calculation (min) 44 min             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/16/2013   L3-4/notes 11/24/2013, arms, neck   Exertional asthma    GERD (gastroesophageal reflux disease)    Hypertension    Kidney stones    "have always passed them"   Neck pain    Osteomyelitis (HCC) 11/23/2013   osteomyelitis, discitis    Sleep concern    uses Prozac for sleep    Past Surgical History:  Procedure Laterality Date   BREAST CYST EXCISION Left 2010   "polypectomy"   IR CT HEAD LTD  01/21/2022   IR CT HEAD LTD  01/28/2022   IR PERCUTANEOUS ART THROMBECTOMY/INFUSION INTRACRANIAL INC DIAG ANGIO  01/21/2022   IR PERCUTANEOUS ART THROMBECTOMY/INFUSION INTRACRANIAL INC DIAG ANGIO  01/28/2022   IR US GUIDE VASC ACCESS RIGHT  01/22/2022   PICC LINE PLACE PERIPHERAL ([facility] HX) Right    for use of Levaquin & Vancomycin, of note: she reports that she had a "flulike feeling"    RADIOLOGY WITH ANESTHESIA N/A 01/21/2022   Procedure: IR WITH ANESTHESIA;  Surgeon: Julieanne Cotton, MD;  Location: [facility] OR;  Service: Radiology;  Laterality: N/A;   RADIOLOGY WITH ANESTHESIA N/A 01/28/2022   Procedure: IR WITH ANESTHESIA;  Surgeon: Radiologist, Medication, MD;  Location: [facility] OR;  Service: Radiology;  Laterality: N/A;   TONSILLECTOMY AND ADENOIDECTOMY  1975   TUBAL LIGATION  1999   Patient Active Problem List   Diagnosis Date Noted   UTI (urinary tract infection) 02/04/2022   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   Stroke  determined by clinical assessment (HCC) 01/28/2022   Middle cerebral artery embolism, left 01/28/2022   H/O ischemic left MCA stroke    Acute respiratory failure (HCC)    CVA (cerebral vascular accident) (HCC) 01/21/2022   NSTEMI (non-ST elevated myocardial infarction) (HCC) 01/21/2022   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   HFrEF (heart failure with reduced ejection fraction) (HCC) 01/21/2022   Hyperlipidemia 01/21/2022   Lumbar stenosis 01/21/2022   Stroke (cerebrum) (HCC) 01/21/2022   S/P lumbar spinal fusion 11/15/2014   Lumbar discitis 11/29/2013   Discitis 11/24/2013   Diarrhea 11/17/2013   Osteomyelitis of lumbar spine (HCC) 11/16/2013   Hypertension    Asthma    Anxiety     ONSET DATE: 01/28/22  REFERRING DIAG: I63.9 (ICD-10-CM) - Cerebral infarction, unspecified   THERAPY DIAG:  Hemiplegia and hemiparesis following cerebral infarction affecting right dominant side (HCC)  Muscle weakness (generalized)  Other lack of coordination  Other disturbances of skin sensation  Other abnormalities of gait and mobility  Rationale for Evaluation and Treatment Rehabilitation  SUBJECTIVE:   SUBJECTIVE STATEMENT: Pt reports short hospitalization 01/21/22-01/23/22 for small acute infarct in the left frontal Pr[place] matter. Pt then readmitted 01/28/22 with RUE weakness (no movement and no sensation) and aphasia.  MRI revealed new acute infarcts in the left posterior frontal and anterior parietal cortex.    Pt accompanied by: self and significant other (spouse)  PERTINENT HISTORY: PMH: HTN, cervical radiculopathy, hepatitis, osteomyelitis/discitis, lumbar stenosis with foot drop anxiety, GERD, insomnia  PRECAUTIONS: Fall  WEIGHT BEARING RESTRICTIONS  was awaiting spinal fusion surgery, limit bending, twisting, lifting >10#  PAIN:  Are you having pain? Yes: NPRS scale: 4/10 Pain location: lower back pain Pain description: pinching, heavy Aggravating factors: lifting,  carrying, traveling Relieving factors: tylenol  FALLS: Has patient fallen in last 6 months? Yes. Number of falls 2  LIVING ENVIRONMENT: Lives with: lives with their spouse Lives in: House/apartment Stairs: Yes: Internal: full flights of steps; on right going up and External: 7 steps; on left going up Has following equipment at home: Single point cane, Walker - 2 wheeled, Walker - 4 wheeled, shower chair, and bed side commode  PLOF: Independent  PATIENT GOALS I want my arm back.    OBJECTIVE:   HAND DOMINANCE: Right  ADLs: Transfers/ambulation related to ADLs: Mod I with no use of  Eating: Husband assists with cutting, feeding self with L hand Grooming: utilizing L hand with opening and brushing teeth UB Dressing: unable to manage buttons, otherwise Mod I LB Dressing: unable to tie shoes, just slipping on toes Toileting: Mod I with use of L hand Bathing: Mod I, utilizing L hand to shave Tub Shower transfers: Mod I Equipment: Shower seat with back, Walk in shower, and bed side commode   IADLs: Light housekeeping: laundry and hanging up clothes, wipes counters/sink, needs assist to fold clothing Meal Prep: no, did not cook pre-stroke Medication management: husband opens pill bottles Handwriting: unable  MOBILITY STATUS: Needs Assist: Supervision - Mod I without use of AD  POSTURE COMMENTS:  No Significant postural limitations  ACTIVITY TOLERANCE: Activity tolerance: WNL for tasks assessed on eval  FUNCTIONAL OUTCOME MEASURES: FOTO: TBD at next visit  UPPER EXTREMITY ROM     Active ROM Right eval Left eval  Shoulder flexion WNL WNL  Shoulder abduction    Shoulder adduction    Shoulder extension    Shoulder internal rotation    Shoulder external rotation    Elbow flexion    Elbow extension -5 WNL  Wrist flexion 38 WNL  Wrist extension 50 WNL  Wrist ulnar deviation    Wrist radial deviation    Wrist pronation    Wrist supination    (Blank rows = not  tested)   UPPER EXTREMITY MMT:     MMT Right eval Left eval  Shoulder flexion 4+/5   Shoulder abduction    Shoulder adduction    Shoulder extension    Shoulder internal rotation    Shoulder external rotation    Middle trapezius    Lower trapezius    Elbow flexion 4+/5   Elbow extension 4+/5   Wrist flexion    Wrist extension    Wrist ulnar deviation    Wrist radial deviation    Wrist pronation    Wrist supination    (Blank rows = not tested)  HAND FUNCTION: Loose gross grasp, unable to squeeze dynamometer  COORDINATION: Box and Blocks:  Right 10 blocks, Left 53 blocks Modified performance with RUE with therapist holding blocks in palm and pt able to complete gross grasp to pick up one at a time from OT palm of hand, minimal hand opening/closing  SENSATION: Light touch: WFL Stereognosis: Impaired  Hot/Cold: Impaired  Proprioception: Impaired  COGNITION: Overall cognitive status: Within functional limits for tasks assessed  VISION: Subjective report: Pt reports that her R eye does not close as well and that she feels that her L eye is compensating for her R eye. Baseline vision: Wears glasses all the time  VISION ASSESSMENT: To be further assessed in functional context  TODAY'S TREATMENT:  N/A   PATIENT EDUCATION: Education details: Educated on role and purpose of OT as well as potential interventions and goals for therapy based on initial evaluation findings. Person educated: Patient and Spouse Education method: Explanation Education comprehension: verbalized understanding and needs further education   HOME EXERCISE PROGRAM: TBD    GOALS: Goals reviewed with patient? No  SHORT TERM GOALS: Target date: 03/28/22  Pt will be independent with HEP for fine and gross motor control. Baseline: Goal status: INITIAL  2.  Pt will demonstrate improved UE functional use for ADLs as evidenced by increasing functional use of RUE to pick up blocks to complete  Box and Blocks assessment in standardized manner. Baseline:  Goal status: INITIAL  3.  Pt will verbalize understanding with safety considerations and AE needs to ensure safety d/t decreased sensation. Baseline:  Goal status: INITIAL   LONG TERM GOALS: Target date: 04/25/22  Pt and spouse will verbalize understanding for return to driving recommendations and pursue driving eval PRN. Baseline:  Goal status: INITIAL  2.  Pt will demonstrate improved UE functional use for ADLs as evidenced by increasing box/ blocks score by 10 blocks with RUE Baseline: unable to move any from box, however able to move 10 with gross grasp from blocks placed in OT palm Goal status: INITIAL  3.  Pt will demonstrate increased functional reach and grasp to obtain light weight item from moderate height shelf. Baseline:  Goal status: INITIAL  4.  Pt will demonstrate increased hand function and grasp to open containers (jars, medications) and verbalize understanding of AE PRN to increase ease, safety, and independence with opening containers. Baseline:  Goal status: INITIAL  5.  Pt will demonstrate use of BUE together to engage in functional IADL tasks (simple meal prep, folding laundry, etc). Baseline:  Goal status: INITIAL   ASSESSMENT:  CLINICAL IMPRESSION: Patient is a 66 y.o. female who was seen today for occupational therapy evaluation for RUE weakness s/p CVA. 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 PMHx includes HTN, cervical radiculopathy, hepatitis, osteomyelitis/discitis, lumbar stenosis with foot drop anxiety, GERD, insomnia. Pt will benefit from skilled occupational therapy services to address strength and coordination, ROM, altered sensation, GM/FM control, safety awareness, introduction of compensatory strategies/AE prn, and implementation of an HEP to improve participation  and safety during ADLs and IADLs.   PERFORMANCE DEFICITS in functional skills including ADLs, IADLs, coordination, dexterity, sensation, ROM, strength, FMC, GMC, balance, body mechanics, decreased knowledge of use of DME, and UE functional use and psychosocial skills including environmental adaptation, habits, and routines and behaviors.   IMPAIRMENTS are limiting patient from ADLs, IADLs, and work.   COMORBIDITIES may have co-morbidities  that affects occupational performance. Patient will benefit from skilled OT to address above impairments and improve overall function.  MODIFICATION OR ASSISTANCE TO COMPLETE EVALUATION: Min-Moderate modification of tasks or assist with assess necessary to complete an evaluation.  OT OCCUPATIONAL PROFILE AND HISTORY: Detailed assessment: Review of records and additional review of physical, cognitive, psychosocial  history related to current functional performance.  CLINICAL DECISION MAKING: Moderate - several treatment options, min-mod task modification necessary  REHAB POTENTIAL: Good  EVALUATION COMPLEXITY: Moderate    PLAN: OT FREQUENCY: 2x/week  OT DURATION: 8 weeks  PLANNED INTERVENTIONS: self care/ADL training, therapeutic exercise, therapeutic activity, neuromuscular re-education, manual therapy, passive range of motion, balance training, functional mobility training, splinting, electrical stimulation, ultrasound, compression bandaging, moist heat, cryotherapy, patient/family education, psychosocial skills training, energy conservation, coping strategies training, and DME and/or AE instructions  RECOMMENDED OTHER SERVICES: N/A  CONSULTED AND AGREED WITH PLAN OF CARE: Patient and family member/caregiver  PLAN FOR NEXT SESSION: Complete FOTO, initiate wrist ROM and hand opening/closing and attempt finger isolation exercises/activities.   Chloe Johnson, OTR/L 02/25/2022, 12:31 PM
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
## 2022-02-27 ENCOUNTER — Ambulatory Visit: Payer: Federal, State, Local not specified - PPO | Attending: Cardiovascular Disease

## 2022-02-27 DIAGNOSIS — I639 Cerebral infarction, unspecified: Secondary | ICD-10-CM

## 2022-02-27 DIAGNOSIS — Z5181 Encounter for therapeutic drug level monitoring: Secondary | ICD-10-CM | POA: Diagnosis not present

## 2022-02-27 DIAGNOSIS — I513 Intracardiac thrombosis, not elsewhere classified: Secondary | ICD-10-CM | POA: Diagnosis not present

## 2022-02-27 LAB — POCT INR: INR: 3 (ref 2.0–3.0)

## 2022-02-27 NOTE — Patient Instructions (Signed)
Continue 1 tablet daily except 1.5 tablets on Sundays. Recheck in INR 1 Week. Anticoagulation Clinic [phone]

## 2022-03-03 ENCOUNTER — Ambulatory Visit: Payer: Federal, State, Local not specified - PPO | Admitting: Occupational Therapy

## 2022-03-05 ENCOUNTER — Ambulatory Visit: Payer: Federal, State, Local not specified - PPO | Admitting: Occupational Therapy

## 2022-03-05 DIAGNOSIS — R278 Other lack of coordination: Secondary | ICD-10-CM | POA: Diagnosis not present

## 2022-03-05 DIAGNOSIS — M6281 Muscle weakness (generalized): Secondary | ICD-10-CM

## 2022-03-05 DIAGNOSIS — R1310 Dysphagia, unspecified: Secondary | ICD-10-CM | POA: Diagnosis not present

## 2022-03-05 DIAGNOSIS — I69351 Hemiplegia and hemiparesis following cerebral infarction affecting right dominant side: Secondary | ICD-10-CM

## 2022-03-05 DIAGNOSIS — R471 Dysarthria and anarthria: Secondary | ICD-10-CM | POA: Diagnosis not present

## 2022-03-05 DIAGNOSIS — R29898 Other symptoms and signs involving the musculoskeletal system: Secondary | ICD-10-CM | POA: Diagnosis not present

## 2022-03-05 DIAGNOSIS — R2689 Other abnormalities of gait and mobility: Secondary | ICD-10-CM | POA: Diagnosis not present

## 2022-03-05 DIAGNOSIS — N3941 Urge incontinence: Secondary | ICD-10-CM | POA: Diagnosis not present

## 2022-03-05 DIAGNOSIS — R208 Other disturbances of skin sensation: Secondary | ICD-10-CM

## 2022-03-05 DIAGNOSIS — R41841 Cognitive communication deficit: Secondary | ICD-10-CM | POA: Diagnosis not present

## 2022-03-05 DIAGNOSIS — R102 Pelvic and perineal pain: Secondary | ICD-10-CM | POA: Diagnosis not present

## 2022-03-05 DIAGNOSIS — R482 Apraxia: Secondary | ICD-10-CM | POA: Diagnosis not present

## 2022-03-05 DIAGNOSIS — R4701 Aphasia: Secondary | ICD-10-CM | POA: Diagnosis not present

## 2022-03-05 NOTE — Therapy (Signed)
OUTPATIENT OCCUPATIONAL THERAPY  Treatment Note  Patient Name: Chloe Johnson MRN: [id number] DOB:[birth year], 66 y.o., female Today's Date: 03/06/2022  PCP: Thana Ates, MD REFERRING PROVIDER: Horton Chin, MD    OT End of Session - 03/05/22 1527     Visit Number 2    Number of Visits 17    Date for OT Re-Evaluation 04/25/22    Authorization Type BC[employer]    OT Start Time 1527    OT Stop Time 1614    OT Time Calculation (min) 47 min              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/16/2013   L3-4/notes 11/24/2013, arms, neck   Exertional asthma    GERD (gastroesophageal reflux disease)    Hypertension    Kidney stones    "have always passed them"   Neck pain    Osteomyelitis (HCC) 11/23/2013   osteomyelitis, discitis    Sleep concern    uses Prozac for sleep    Past Surgical History:  Procedure Laterality Date   BREAST CYST EXCISION Left 2010   "polypectomy"   IR CT HEAD LTD  01/21/2022   IR CT HEAD LTD  01/28/2022   IR PERCUTANEOUS ART THROMBECTOMY/INFUSION INTRACRANIAL INC DIAG ANGIO  01/21/2022   IR PERCUTANEOUS ART THROMBECTOMY/INFUSION INTRACRANIAL INC DIAG ANGIO  01/28/2022   IR US GUIDE VASC ACCESS RIGHT  01/22/2022   PICC LINE PLACE PERIPHERAL ([facility] HX) Right    for use of Levaquin & Vancomycin, of note: she reports that she had a "flulike feeling"    RADIOLOGY WITH ANESTHESIA N/A 01/21/2022   Procedure: IR WITH ANESTHESIA;  Surgeon: Julieanne Cotton, MD;  Location: [facility] OR;  Service: Radiology;  Laterality: N/A;   RADIOLOGY WITH ANESTHESIA N/A 01/28/2022   Procedure: IR WITH ANESTHESIA;  Surgeon: Radiologist, Medication, MD;  Location: [facility] OR;  Service: Radiology;  Laterality: N/A;   TONSILLECTOMY AND ADENOIDECTOMY  1975   TUBAL LIGATION  1999   Patient Active Problem List   Diagnosis Date Noted   UTI (urinary tract infection) 02/04/2022   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    Stroke determined by clinical assessment (HCC) 01/28/2022   Middle cerebral artery embolism, left 01/28/2022   H/O ischemic left MCA stroke    Acute respiratory failure (HCC)    CVA (cerebral vascular accident) (HCC) 01/21/2022   NSTEMI (non-ST elevated myocardial infarction) (HCC) 01/21/2022   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   HFrEF (heart failure with reduced ejection fraction) (HCC) 01/21/2022   Hyperlipidemia 01/21/2022   Lumbar stenosis 01/21/2022   Stroke (cerebrum) (HCC) 01/21/2022   S/P lumbar spinal fusion 11/15/2014   Lumbar discitis 11/29/2013   Discitis 11/24/2013   Diarrhea 11/17/2013   Osteomyelitis of lumbar spine (HCC) 11/16/2013   Hypertension    Asthma    Anxiety     ONSET DATE: 01/28/22  REFERRING DIAG: I63.9 (ICD-10-CM) - Cerebral infarction, unspecified   THERAPY DIAG:  Hemiplegia and hemiparesis following cerebral infarction affecting right dominant side (HCC)  Other lack of coordination  Other disturbances of skin sensation  Muscle weakness (generalized)  Other abnormalities of gait and mobility  Rationale for Evaluation and Treatment Rehabilitation  SUBJECTIVE:   SUBJECTIVE STATEMENT: Pt reports intermittent pain in R shoulder with certain movements. Pt accompanied by: self and significant other (spouse)  PERTINENT HISTORY: PMH: HTN, cervical radiculopathy, hepatitis, osteomyelitis/discitis,  lumbar stenosis with foot drop anxiety, GERD, insomnia  PRECAUTIONS: Fall  WEIGHT BEARING RESTRICTIONS  was awaiting spinal fusion surgery, limit bending, twisting, lifting >10#  PAIN:  Are you having pain? Yes: NPRS scale: 2/10 Pain location: R shoulder Pain description: pinching Aggravating factors: certain movements Relieving factors: tylenol  FALLS: Has patient fallen in last 6 months? Yes. Number of falls 2  LIVING ENVIRONMENT: Lives with: lives with their spouse Lives in: House/apartment Stairs: Yes: Internal: full flights  of steps; on right going up and External: 7 steps; on left going up Has following equipment at home: Single point cane, Walker - 2 wheeled, Walker - 4 wheeled, shower chair, and bed side commode  PLOF: Independent  PATIENT GOALS I want my arm back.    OBJECTIVE:   HAND DOMINANCE: Right  ADLs: Transfers/ambulation related to ADLs: Mod I with no use of  Eating: Husband assists with cutting, feeding self with L hand Grooming: utilizing L hand with opening and brushing teeth UB Dressing: unable to manage buttons, otherwise Mod I LB Dressing: unable to tie shoes, just slipping on toes Toileting: Mod I with use of L hand Bathing: Mod I, utilizing L hand to shave Tub Shower transfers: Mod I Equipment: Shower seat with back, Walk in shower, and bed side commode   IADLs: Light housekeeping: laundry and hanging up clothes, wipes counters/sink, needs assist to fold clothing Meal Prep: no, did not cook pre-stroke Medication management: husband opens pill bottles Handwriting: unable  MOBILITY STATUS: Needs Assist: Supervision - Mod I without use of AD  POSTURE COMMENTS:  No Significant postural limitations  ACTIVITY TOLERANCE: Activity tolerance: WNL for tasks assessed on eval  FUNCTIONAL OUTCOME MEASURES: FOTO: 49  UPPER EXTREMITY ROM     Active ROM Right eval Left eval  Shoulder flexion WNL WNL  Shoulder abduction    Shoulder adduction    Shoulder extension    Shoulder internal rotation    Shoulder external rotation    Elbow flexion    Elbow extension -5 WNL  Wrist flexion 38 WNL  Wrist extension 50 WNL  Wrist ulnar deviation    Wrist radial deviation    Wrist pronation    Wrist supination    (Blank rows = not tested)   UPPER EXTREMITY MMT:     MMT Right eval Left eval  Shoulder flexion 4+/5   Shoulder abduction    Shoulder adduction    Shoulder extension    Shoulder internal rotation    Shoulder external rotation    Middle trapezius    Lower trapezius     Elbow flexion 4+/5   Elbow extension 4+/5   Wrist flexion    Wrist extension    Wrist ulnar deviation    Wrist radial deviation    Wrist pronation    Wrist supination    (Blank rows = not tested)  HAND FUNCTION: Loose gross grasp, unable to squeeze dynamometer  COORDINATION: Box and Blocks:  Right 10 blocks, Left 53 blocks Modified performance with RUE with therapist holding blocks in palm and pt able to complete gross grasp to pick up one at a time from OT palm of hand, minimal hand opening/closing  SENSATION: Light touch: WFL Stereognosis: Impaired  Hot/Cold: Impaired  Proprioception: Impaired   COGNITION: Overall cognitive status: Within functional limits for tasks assessed  VISION: Subjective report: Pt reports that her R eye does not close as well and that she feels that her L eye is compensating for her R eye.  Baseline vision: Wears glasses all the time  VISION ASSESSMENT: To be further assessed in functional context  --------------------------------------------------------------------------------------------------------------------------------------------------------------------- (objective measures above completed at initial evaluation unless otherwise dated)  TODAY'S TREATMENT:  03/05/22 Coordination: picking up 1" cubes with gross grasp.  Pt demonstrating mod difficulty with grasp, however able to complete with increased time and dropping ~25% of time.  OT facilitated increased challenge by moving target to R to facilitate increased reach. Functional reach: OT facilitated increased challenge with picking up large jacks with R hand and incorporating increased reach to placing into vertical cones.  OT providing intermittent tactile cues and verbal cues to decrease shoulder hike and lateral lean.   AROM: Isolated hand movement with thumb opposition, finger abduction/adduction, attempts at finger extension, and fist open/close.  Pt able to only oppose thumb to first  finger, but unable to progress to next fingers.  Pt with ability to complete abduction but when completing adduction ultimately completing flexion. Pt with inability to complete finger extension. Reviewed exercises and provided HEP with exercises listed.   PATIENT EDUCATION: Education details: Educated on functional open/close and grasp tasks as well as isolated finger movements. Person educated: Patient and friend Education method: Explanation Education comprehension: verbalized understanding and needs further education   HOME EXERCISE PROGRAM: Fine motor coordination handout    GOALS: Goals reviewed with patient? Yes  SHORT TERM GOALS: Target date: 03/28/22  Pt will be independent with HEP for fine and gross motor control. Baseline: Goal status: IN PROGRESS  2.  Pt will demonstrate improved UE functional use for ADLs as evidenced by increasing functional use of RUE to pick up blocks to complete Box and Blocks assessment in standardized manner. Baseline:  Goal status: IN PROGRESS  3.  Pt will verbalize understanding with safety considerations and AE needs to ensure safety d/t decreased sensation. Baseline:  Goal status: IN PROGRESS   LONG TERM GOALS: Target date: 04/25/22  Pt and spouse will verbalize understanding for return to driving recommendations and pursue driving eval PRN. Baseline:  Goal status: IN PROGRESS  2.  Pt will demonstrate improved UE functional use for ADLs as evidenced by increasing box/ blocks score by 10 blocks with RUE Baseline: unable to move any from box, however able to move 10 with gross grasp from blocks placed in OT palm Goal status: IN PROGRESS  3.  Pt will demonstrate increased functional reach and grasp to obtain light weight item from moderate height shelf. Baseline:  Goal status: IN PROGRESS  4.  Pt will demonstrate increased hand function and grasp to open containers (jars, medications) and verbalize understanding of AE PRN to increase  ease, safety, and independence with opening containers. Baseline:  Goal status: IN PROGRESS  5.  Pt will demonstrate use of BUE together to engage in functional IADL tasks (simple meal prep, folding laundry, etc). Baseline:  Goal status: IN PROGRESS   ASSESSMENT:  CLINICAL IMPRESSION: Pt seen for first treatment session s/p initial evaluation.  OT reiterated POC and reviewed established goals with pt; pt in agreement.  Pt responding well to intermittent tactile cues for positioning and decreased shoulder hike during reaching activities.  Pt performing gross grasp activities with mod difficulty, but improving technique with repetition.  PERFORMANCE DEFICITS in functional skills including ADLs, IADLs, coordination, dexterity, sensation, ROM, strength, FMC, GMC, balance, body mechanics, decreased knowledge of use of DME, and UE functional use and psychosocial skills including environmental adaptation, habits, and routines and behaviors.   IMPAIRMENTS are limiting patient from ADLs,  IADLs, and work.   COMORBIDITIES may have co-morbidities  that affects occupational performance. Patient will benefit from skilled OT to address above impairments and improve overall function.  MODIFICATION OR ASSISTANCE TO COMPLETE EVALUATION: Min-Moderate modification of tasks or assist with assess necessary to complete an evaluation.  OT OCCUPATIONAL PROFILE AND HISTORY: Detailed assessment: Review of records and additional review of physical, cognitive, psychosocial history related to current functional performance.  CLINICAL DECISION MAKING: Moderate - several treatment options, min-mod task modification necessary  REHAB POTENTIAL: Good  EVALUATION COMPLEXITY: Moderate    PLAN: OT FREQUENCY: 2x/week  OT DURATION: 8 weeks  PLANNED INTERVENTIONS: self care/ADL training, therapeutic exercise, therapeutic activity, neuromuscular re-education, manual therapy, passive range of motion, balance training,  functional mobility training, splinting, electrical stimulation, ultrasound, compression bandaging, moist heat, cryotherapy, patient/family education, psychosocial skills training, energy conservation, coping strategies training, and DME and/or AE instructions  RECOMMENDED OTHER SERVICES: N/A  CONSULTED AND AGREED WITH PLAN OF CARE: Patient and family member/caregiver  PLAN FOR NEXT SESSION: engage in wrist ROM and hand opening/closing and attempt finger isolation exercises/activities. Gross grasp and reach activities.   Rosalio Loud, OTR/L 03/06/2022, 7:51 AM

## 2022-03-06 ENCOUNTER — Ambulatory Visit: Payer: Federal, State, Local not specified - PPO | Attending: Cardiovascular Disease | Admitting: *Deleted

## 2022-03-06 DIAGNOSIS — I639 Cerebral infarction, unspecified: Secondary | ICD-10-CM

## 2022-03-06 DIAGNOSIS — I513 Intracardiac thrombosis, not elsewhere classified: Secondary | ICD-10-CM

## 2022-03-06 LAB — POCT INR: INR: 4.3 — AB (ref 2.0–3.0)

## 2022-03-06 NOTE — Patient Instructions (Signed)
Description   Do not take any Warfarin today then continue taking 1 tablet daily except 1.5 tablets on Sundays. Add one green leafy veggie to diet weekly. Recheck in INR 1 Week. Anticoagulation Clinic [phone]

## 2022-03-07 ENCOUNTER — Ambulatory Visit (HOSPITAL_COMMUNITY): Payer: Federal, State, Local not specified - PPO | Attending: Cardiology

## 2022-03-07 DIAGNOSIS — I513 Intracardiac thrombosis, not elsewhere classified: Secondary | ICD-10-CM | POA: Insufficient documentation

## 2022-03-07 LAB — ECHOCARDIOGRAM COMPLETE
Area-P 1/2: 4.06 cm2
S' Lateral: 2.6 cm

## 2022-03-09 ENCOUNTER — Encounter: Payer: Self-pay | Admitting: Cardiovascular Disease

## 2022-03-09 NOTE — Progress Notes (Unsigned)
Cardiology Office Note:    Date:  03/10/2022   ID:  Chloe Johnson, DOB March 18, 1956, MRN [id number]  PCP:  Charlane Ferretti, MD   [facility] Providers Cardiologist: Leala Bryand    Referring MD: Lavone Orn, MD   Chief Complaint  Patient presents with   Congestive Heart Failure        Cerebrovascular Accident     History of Present Illness:    Chloe Johnson is a 66 y.o. female with a hx of  ? 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  She was thought to have had an eliquis failure and  She has now been transitioned to coumadin /Lovenox. I actually think the issues was that she just had not been on any anticoagulant long enough to start dissolving the thrombus   Has an expressive aphasia  Improving with rehab. Improving daily   INR followed at [facility]  Last INR is 1.3   Oct. 16, 2023 Chloe Johnson is seen for follow up of her CHF, LV thrombus and stroke Echo several days ago shows normal LV function without mention of segmental wall motion abn. Mild MR, trivial TR  Mild PI   Her last INR is 4.3 on Oct. 12, 2023  Managed by our coumadin clinic      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/16/2013   L3-4/notes 11/24/2013, arms, neck   Exertional asthma    GERD (gastroesophageal reflux disease)    Hypertension    Kidney stones    "have always passed them"   Neck pain    Osteomyelitis ([place]) 11/23/2013   osteomyelitis, discitis    Sleep concern    uses Prozac for sleep     Past Surgical History:  Procedure Laterality Date   BREAST CYST EXCISION Left 2010   "polypectomy"   IR CT HEAD LTD  01/21/2022   IR CT HEAD LTD  01/28/2022   IR PERCUTANEOUS ART THROMBECTOMY/INFUSION INTRACRANIAL INC DIAG ANGIO  01/21/2022   IR PERCUTANEOUS ART  THROMBECTOMY/INFUSION INTRACRANIAL INC DIAG ANGIO  01/28/2022   IR US GUIDE VASC ACCESS RIGHT  01/22/2022   PICC LINE PLACE PERIPHERAL ([place] HX) Right    for use of Levaquin & Vancomycin, of note: she reports that she had a "flulike feeling"    RADIOLOGY WITH ANESTHESIA N/A 01/21/2022   Procedure: IR WITH ANESTHESIA;  Surgeon: Luanne Bras, MD;  Location: [facility];  Service: Radiology;  Laterality: N/A;   RADIOLOGY WITH ANESTHESIA N/A 01/28/2022   Procedure: IR WITH ANESTHESIA;  Surgeon: Radiologist, Medication, MD;  Location: [facility];  Service: Radiology;  Laterality: N/A;   TONSILLECTOMY AND ADENOIDECTOMY  1975   TUBAL LIGATION  1999    Current Medications: Current Meds  Medication Sig   acetaminophen (TYLENOL) 325 MG tablet Take 2 tablets (650 mg total) by mouth every 4 (four) hours as needed for mild pain (or temp > 37.5 C (99.5 F)).   aspirin EC 81 MG tablet Take 1 tablet (81 mg total) by mouth daily. Swallow whole.   cyclobenzaprine (FLEXERIL) 10 MG tablet Take 10 mg by mouth at bedtime.   famotidine (PEPCID) 20 MG tablet Take 20 mg by mouth 2 (two) times daily.   ferrous sulfate 325 (65 FE) MG tablet Take 1  tablet (325 mg total) by mouth daily with breakfast.   loratadine (CLARITIN) 10 MG tablet Take 1 tablet (10 mg total) by mouth daily.   metoprolol succinate (TOPROL-XL) 25 MG 24 hr tablet Take 0.5 tablets (12.5 mg total) by mouth daily.   MYRBETRIQ 25 MG TB24 tablet Take 25 mg by mouth daily.   potassium chloride (KLOR-CON) 10 MEQ tablet Take 2 tablets by mouth twice daily   pregabalin (LYRICA) 100 MG capsule 100 mg daily and 200 mg nightly   rosuvastatin (CRESTOR) 20 MG tablet Take 1 tablet (20 mg total) by mouth daily.   sacubitril-valsartan (ENTRESTO) 24-26 MG Take 1 tablet by mouth 2 (two) times daily.   warfarin (COUMADIN) 4 MG tablet Take 1 tablet (4 mg total) by mouth daily.   [DISCONTINUED] famotidine (PEPCID) 20 MG tablet Take 1 tablet (20 mg total) by mouth daily. (Patient  taking differently: Take 20 mg by mouth 2 (two) times daily.)   [DISCONTINUED] sacubitril-valsartan (ENTRESTO) 24-26 MG Take 1 tablet by mouth daily. (Patient taking differently: Take 1 tablet by mouth in the morning and at bedtime.)     Allergies:   Doxycycline, Erythromycin, Iodine, Latex, Shellfish allergy, Strawberry extract, Vancomycin, Vicodin [hydrocodone-acetaminophen], Dilaudid [hydromorphone hcl], Keflex [cephalexin], Penicillins, and Sulfa antibiotics   Social History   Socioeconomic History   Marital status: Married    Spouse name: Charles   Number of children: 3   Years of education: RN   Highest education level: Not on file  Occupational History    Employer: [employer]   Tobacco Use   Smoking status: Never   Smokeless tobacco: Never  Vaping Use   Vaping Use: Never used  Substance and Sexual Activity   Alcohol use: Yes    Alcohol/week: 6.0 standard drinks of alcohol    Types: 3 Glasses of wine, 3 Shots of liquor per week   Drug use: No   Sexual activity: Yes    Birth control/protection: Post-menopausal  Other Topics Concern   Not on file  Social History Narrative   Patient is married and lives at home with her husband Juanda Crumble).  Patient works full time as a Therapist, sports.   Right handed.   College education.   Social Determinants of Health   Financial Resource Strain: Not on file  Food Insecurity: Not on file  Transportation Needs: Not on file  Physical Activity: Not on file  Stress: Not on file  Social Connections: Not on file     Family History: The patient's family history includes Hypertension in her father; Other in her father and mother.  ROS:   Please see the history of present illness.     All other systems reviewed and are negative.  EKGs/Labs/Other Studies Reviewed:    The following studies were reviewed today:   EKG:  Recent Labs: 01/30/2022: TSH 1.712 02/03/2022: Magnesium 2.1 02/05/2022: ALT 18 02/07/2022: Hemoglobin 10.8; Platelets  247 02/17/2022: BUN 9; Creatinine, Ser 0.66; Potassium 4.0; Sodium 142  Recent Lipid Panel    Component Value Date/Time   CHOL 223 (H) 01/22/2022 0351   TRIG 271 (H) 01/29/2022 0300   HDL 40 (L) 01/22/2022 0351   CHOLHDL 5.6 01/22/2022 0351   VLDL 33 01/22/2022 0351   LDLCALC 150 (H) 01/22/2022 0351     Risk Assessment/Calculations:       Physical Exam:    Physical Exam: Blood pressure 112/72, pulse 63, height 5' (1.524 m), weight 137 lb 3.2 oz (62.2 kg), SpO2 97 %.  GEN:  Well nourished, well developed in no acute distress HEENT: Normal NECK: No JVD; No carotid bruits LYMPHATICS: No lymphadenopathy CARDIAC: RRR , no murmurs, rubs, gallops RESPIRATORY:  Clear to auscultation without rales, wheezing or rhonchi  ABDOMEN: Soft, non-tender, non-distended MUSCULOSKELETAL:  No edema; No deformity  SKIN: Warm and dry NEUROLOGIC:  Alert and oriented x 3   ASSESSMENT:    1. Cerebrovascular accident (CVA) due to embolism of left middle cerebral artery (HCC)   2. HFrEF (heart failure with reduced ejection fraction) ([place])   3. Takotsubo cardiomyopathy     PLAN:        Chronic systolic CHF : is now taking entresto 24-26 BID .  Seems to be tolerating it well.      2. LV thrombus:    She is on coumadin.   Recent echo shows resolution of her apical aneurism and the apical thrombus    3.  Stroke:     slow progress  Her speech Is gradually improving               Medication Adjustments/Labs and Tests Ordered: Current medicines are reviewed at length with the patient today.  Concerns regarding medicines are outlined above.  No orders of the defined types were placed in this encounter.  No orders of the defined types were placed in this encounter.   There are no Patient Instructions on file for this visit.   Signed, Mertie Moores, MD  03/10/2022 10:13 AM    [place]

## 2022-03-10 ENCOUNTER — Ambulatory Visit: Payer: Federal, State, Local not specified - PPO | Attending: Cardiovascular Disease | Admitting: Cardiovascular Disease

## 2022-03-10 ENCOUNTER — Encounter: Payer: Self-pay | Admitting: Cardiovascular Disease

## 2022-03-10 VITALS — BP 112/72 | HR 63 | Ht 60.0 in | Wt 137.2 lb

## 2022-03-10 DIAGNOSIS — I5020 Unspecified systolic (congestive) heart failure: Secondary | ICD-10-CM | POA: Diagnosis not present

## 2022-03-10 DIAGNOSIS — I5181 Takotsubo syndrome: Secondary | ICD-10-CM | POA: Diagnosis not present

## 2022-03-10 DIAGNOSIS — I63412 Cerebral infarction due to embolism of left middle cerebral artery: Secondary | ICD-10-CM | POA: Diagnosis not present

## 2022-03-10 DIAGNOSIS — Z79899 Other long term (current) drug therapy: Secondary | ICD-10-CM | POA: Diagnosis not present

## 2022-03-10 MED ORDER — ROSUVASTATIN CALCIUM 20 MG PO TABS: 20.0000 mg | ORAL_TABLET | Freq: Every day | ORAL | 3 refills | Status: DC

## 2022-03-10 MED ORDER — FERROUS SULFATE 325 (65 FE) MG PO TABS: 325.0000 mg | ORAL_TABLET | Freq: Every day | ORAL | 3 refills | Status: DC

## 2022-03-10 MED ORDER — FAMOTIDINE 20 MG PO TABS: 20.0000 mg | ORAL_TABLET | Freq: Two times a day (BID) | ORAL | 3 refills | Status: DC

## 2022-03-10 MED ORDER — ENTRESTO 24-26 MG PO TABS: 1.0000 | ORAL_TABLET | Freq: Two times a day (BID) | ORAL | 3 refills | Status: DC

## 2022-03-10 NOTE — Patient Instructions (Signed)
Medication Instructions:   *If you need a refill on your cardiac medications before your next appointment, please call your pharmacy*   Lab Work: BMET AND CBC TODAY  If you have labs (blood work) drawn today and your tests are completely normal, you will receive your results only by: [place] (if you have MyChart) OR A paper copy in the mail If you have any lab test that is abnormal or we need to change your treatment, we will call you to review the results.   Testing/Procedures:    Follow-Up: At [facility], you and your health needs are our priority.  As part of our continuing mission to provide you with exceptional heart care, we have created designated Provider Care Teams.  These Care Teams include your primary Cardiologist (physician) and Advanced Practice Providers (APPs -  Physician Assistants and Nurse Practitioners) who all work together to provide you with the care you need, when you need it.  We recommend signing up for the patient portal called "MyChart".  Sign up information is provided on this After Visit Summary.  MyChart is used to connect with patients for Virtual Visits (Telemedicine).  Patients are able to view lab/test results, encounter notes, upcoming appointments, etc.  Non-urgent messages can be sent to your provider as well.   To learn more about what you can do with MyChart, go to [email or web address].    Your next appointment:   6  month(s)  The format for your next appointment:   In Person Dr Acie Fredrickson    Other Instructions   Important Information About Sugar

## 2022-03-11 ENCOUNTER — Ambulatory Visit: Payer: Federal, State, Local not specified - PPO | Admitting: Occupational Therapy

## 2022-03-11 DIAGNOSIS — R278 Other lack of coordination: Secondary | ICD-10-CM | POA: Diagnosis not present

## 2022-03-11 DIAGNOSIS — R4701 Aphasia: Secondary | ICD-10-CM | POA: Diagnosis not present

## 2022-03-11 DIAGNOSIS — R471 Dysarthria and anarthria: Secondary | ICD-10-CM | POA: Diagnosis not present

## 2022-03-11 DIAGNOSIS — M6281 Muscle weakness (generalized): Secondary | ICD-10-CM | POA: Diagnosis not present

## 2022-03-11 DIAGNOSIS — R2689 Other abnormalities of gait and mobility: Secondary | ICD-10-CM | POA: Diagnosis not present

## 2022-03-11 DIAGNOSIS — I69351 Hemiplegia and hemiparesis following cerebral infarction affecting right dominant side: Secondary | ICD-10-CM | POA: Diagnosis not present

## 2022-03-11 DIAGNOSIS — R482 Apraxia: Secondary | ICD-10-CM | POA: Diagnosis not present

## 2022-03-11 DIAGNOSIS — R1310 Dysphagia, unspecified: Secondary | ICD-10-CM | POA: Diagnosis not present

## 2022-03-11 DIAGNOSIS — R41841 Cognitive communication deficit: Secondary | ICD-10-CM | POA: Diagnosis not present

## 2022-03-11 DIAGNOSIS — R208 Other disturbances of skin sensation: Secondary | ICD-10-CM | POA: Diagnosis not present

## 2022-03-11 DIAGNOSIS — R29898 Other symptoms and signs involving the musculoskeletal system: Secondary | ICD-10-CM | POA: Diagnosis not present

## 2022-03-11 LAB — BASIC METABOLIC PANEL
BUN/Creatinine Ratio: 25 (ref 12–28)
BUN: 18 mg/dL (ref 8–27)
CO2: 22 mmol/L (ref 20–29)
Calcium: 10 mg/dL (ref 8.7–10.3)
Chloride: 107 mmol/L — ABNORMAL HIGH (ref 96–106)
Creatinine, Ser: 0.71 mg/dL (ref 0.57–1.00)
Glucose: 108 mg/dL — ABNORMAL HIGH (ref 70–99)
Potassium: 4.2 mmol/L (ref 3.5–5.2)
Sodium: 142 mmol/L (ref 134–144)
eGFR: 95 mL/min/{1.73_m2} (ref >59)

## 2022-03-11 LAB — CBC
Hematocrit: 36.6 % (ref 34.0–46.6)
Hemoglobin: 12 g/dL (ref 11.1–15.9)
MCH: 29.1 pg (ref 26.6–33.0)
MCHC: 32.8 g/dL (ref 31.5–35.7)
MCV: 89 fL (ref 79–97)
Platelets: 167 10*3/uL (ref 150–450)
RBC: 4.12 x10E6/uL (ref 3.77–5.28)
RDW: 12.7 % (ref 11.7–15.4)
WBC: 4.4 10*3/uL (ref 3.4–10.8)

## 2022-03-11 NOTE — Therapy (Signed)
OUTPATIENT OCCUPATIONAL THERAPY  Treatment Note  Patient Name: Chloe Johnson MRN: [id number] DOB:May 01, 1956, 66 y.o., female Today's Date: 03/11/2022  PCP: Thana Ates, MD REFERRING PROVIDER: Horton Chin, MD    OT End of Session - 03/11/22 1257     Visit Number 3    Number of Visits 17    Date for OT Re-Evaluation 04/25/22    Authorization Type [employer]    OT Start Time 1235    OT Stop Time 1319    OT Time Calculation (min) 44 min               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/16/2013   L3-4/notes 11/24/2013, arms, neck   Exertional asthma    GERD (gastroesophageal reflux disease)    Hypertension    Kidney stones    "have always passed them"   Neck pain    Osteomyelitis (HCC) 11/23/2013   osteomyelitis, discitis    Sleep concern    uses Prozac for sleep    Past Surgical History:  Procedure Laterality Date   BREAST CYST EXCISION Left 2010   "polypectomy"   IR CT HEAD LTD  01/21/2022   IR CT HEAD LTD  01/28/2022   IR PERCUTANEOUS ART THROMBECTOMY/INFUSION INTRACRANIAL INC DIAG ANGIO  01/21/2022   IR PERCUTANEOUS ART THROMBECTOMY/INFUSION INTRACRANIAL INC DIAG ANGIO  01/28/2022   IR US GUIDE VASC ACCESS RIGHT  01/22/2022   PICC LINE PLACE PERIPHERAL ([facility] HX) Right    for use of Levaquin & Vancomycin, of note: she reports that she had a "flulike feeling"    RADIOLOGY WITH ANESTHESIA N/A 01/21/2022   Procedure: IR WITH ANESTHESIA;  Surgeon: Julieanne Cotton, MD;  Location: [facility] OR;  Service: Radiology;  Laterality: N/A;   RADIOLOGY WITH ANESTHESIA N/A 01/28/2022   Procedure: IR WITH ANESTHESIA;  Surgeon: Radiologist, Medication, MD;  Location: [facility] OR;  Service: Radiology;  Laterality: N/A;   TONSILLECTOMY AND ADENOIDECTOMY  1975   TUBAL LIGATION  1999   Patient Active Problem List   Diagnosis Date Noted   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   UTI (urinary tract infection) 02/04/2022   Left middle  cerebral artery stroke (HCC) 02/04/2022   Stroke determined by clinical assessment (HCC) 01/28/2022   Middle cerebral artery embolism, left 01/28/2022   H/O ischemic left MCA stroke    Acute respiratory failure (HCC)    CVA (cerebral vascular accident) (HCC) 01/21/2022   NSTEMI (non-ST elevated myocardial infarction) (HCC) 01/21/2022   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   HFrEF (heart failure with reduced ejection fraction) (HCC) 01/21/2022   Hyperlipidemia 01/21/2022   Lumbar stenosis 01/21/2022   Stroke (cerebrum) (HCC) 01/21/2022   S/P lumbar spinal fusion 11/15/2014   Lumbar discitis 11/29/2013   Discitis 11/24/2013   Diarrhea 11/17/2013   Osteomyelitis of lumbar spine (HCC) 11/16/2013   Hypertension    Asthma    Anxiety     ONSET DATE: 01/28/22  REFERRING DIAG: I63.9 (ICD-10-CM) - Cerebral infarction, unspecified   THERAPY DIAG:  Hemiplegia and hemiparesis following cerebral infarction affecting right dominant side (HCC)  Other lack of coordination  Other disturbances of skin sensation  Muscle weakness (generalized)  Rationale for Evaluation and Treatment Rehabilitation  SUBJECTIVE:   SUBJECTIVE STATEMENT: Pt reports intermittent pain in RUE as mobility is returning. Pt accompanied by: self and significant other (spouse)  PERTINENT HISTORY: PMH: HTN, cervical radiculopathy, hepatitis, osteomyelitis/discitis, lumbar  stenosis with foot drop anxiety, GERD, insomnia  PRECAUTIONS: Fall  WEIGHT BEARING RESTRICTIONS  was awaiting spinal fusion surgery, limit bending, twisting, lifting >10#  PAIN:  Are you having pain? Yes: NPRS scale: 3-4/10 Pain location: R shoulder to hand Pain description: aching "as it wakes up it is hurting" Aggravating factors: certain movements Relieving factors: tylenol  FALLS: Has patient fallen in last 6 months? Yes. Number of falls 2  LIVING ENVIRONMENT: Lives with: lives with their spouse Lives in:  House/apartment Stairs: Yes: Internal: full flights of steps; on right going up and External: 7 steps; on left going up Has following equipment at home: Single point cane, Walker - 2 wheeled, Walker - 4 wheeled, shower chair, and bed side commode  PLOF: Independent  PATIENT GOALS I want my arm back.    OBJECTIVE:   HAND DOMINANCE: Right  ADLs: Transfers/ambulation related to ADLs: Mod I with no use of  Eating: Husband assists with cutting, feeding self with L hand Grooming: utilizing L hand with opening and brushing teeth UB Dressing: unable to manage buttons, otherwise Mod I LB Dressing: unable to tie shoes, just slipping on toes Toileting: Mod I with use of L hand Bathing: Mod I, utilizing L hand to shave Tub Shower transfers: Mod I Equipment: Shower seat with back, Walk in shower, and bed side commode   IADLs: Light housekeeping: laundry and hanging up clothes, wipes counters/sink, needs assist to fold clothing Meal Prep: no, did not cook pre-stroke Medication management: husband opens pill bottles Handwriting: unable  MOBILITY STATUS: Needs Assist: Supervision - Mod I without use of AD  POSTURE COMMENTS:  No Significant postural limitations  ACTIVITY TOLERANCE: Activity tolerance: WNL for tasks assessed on eval  FUNCTIONAL OUTCOME MEASURES: FOTO: 49  UPPER EXTREMITY ROM     Active ROM Right eval Left eval  Shoulder flexion WNL WNL  Shoulder abduction    Shoulder adduction    Shoulder extension    Shoulder internal rotation    Shoulder external rotation    Elbow flexion    Elbow extension -5 WNL  Wrist flexion 38 WNL  Wrist extension 50 WNL  Wrist ulnar deviation    Wrist radial deviation    Wrist pronation    Wrist supination    (Blank rows = not tested)   UPPER EXTREMITY MMT:     MMT Right eval Left eval  Shoulder flexion 4+/5   Shoulder abduction    Shoulder adduction    Shoulder extension    Shoulder internal rotation    Shoulder  external rotation    Middle trapezius    Lower trapezius    Elbow flexion 4+/5   Elbow extension 4+/5   Wrist flexion    Wrist extension    Wrist ulnar deviation    Wrist radial deviation    Wrist pronation    Wrist supination    (Blank rows = not tested)  HAND FUNCTION: Loose gross grasp, unable to squeeze dynamometer  COORDINATION: Box and Blocks:  Right 10 blocks, Left 53 blocks Modified performance with RUE with therapist holding blocks in palm and pt able to complete gross grasp to pick up one at a time from OT palm of hand, minimal hand opening/closing  SENSATION: Light touch: WFL Stereognosis: Impaired  Hot/Cold: Impaired  Proprioception: Impaired   COGNITION: Overall cognitive status: Within functional limits for tasks assessed  VISION: Subjective report: Pt reports that her R eye does not close as well and that she feels that her  L eye is compensating for her R eye. Baseline vision: Wears glasses all the time  VISION ASSESSMENT: To be further assessed in functional context  --------------------------------------------------------------------------------------------------------------------------------------------------------------------- (objective measures above completed at initial evaluation unless otherwise dated)  TODAY'S TREATMENT:  03/11/22 Precision pinch: engaged in picking up large jacks from point to facilitate increased precision pinch.  Pt able to complete with increased time and min-mod increased effort.  OT modified task to picking up small stones with pt demonstrating increased difficulty; ultimately able to pick up when stone turned over to allow flat side up to get underneath stone for improved grasp.  OT moving target to allow for table top reaching and overhead reaching to address shoulder mobility as well.  Coordination: picking up and placing large grip pegs into resistive peg board.  Pt demonstrating mod difficulty picking up and rotating pegs  into hand, however with repetition demonstrating improvements.  OT providing min verbal and tactile cues for improved technique as pt initially incorporating shoulder hike and rotation when placing pegs.   Wrist ROM/strengthening: Engaged in wrist flexion/extension and radial/ulnar deviation progressing to completion with 1# dumbbell.  Pt demonstrating good sustained grasp with R hand, however with increased weight demonstrating decreased motor control.  Discussed functional items to utilize to continue to address ROM without overdoing it with weight.      03/05/22 Coordination: picking up 1" cubes with gross grasp.  Pt demonstrating mod difficulty with grasp, however able to complete with increased time and dropping ~25% of time.  OT facilitated increased challenge by moving target to R to facilitate increased reach. Functional reach: OT facilitated increased challenge with picking up large jacks with R hand and incorporating increased reach to placing into vertical cones.  OT providing intermittent tactile cues and verbal cues to decrease shoulder hike and lateral lean.   AROM: Isolated hand movement with thumb opposition, finger abduction/adduction, attempts at finger extension, and fist open/close.  Pt able to only oppose thumb to first finger, but unable to progress to next fingers.  Pt with ability to complete abduction but when completing adduction ultimately completing flexion. Pt with inability to complete finger extension. Reviewed exercises and provided HEP with exercises listed.   PATIENT EDUCATION: Education details: Educated on functional open/close and grasp tasks as well as isolated finger movements. Person educated: Patient and friend Education method: Explanation Education comprehension: verbalized understanding and needs further education   HOME EXERCISE PROGRAM: Fine motor coordination handout    GOALS: Goals reviewed with patient? Yes  SHORT TERM GOALS: Target date:  03/28/22  Pt will be independent with HEP for fine and gross motor control. Baseline: Goal status: IN PROGRESS  2.  Pt will demonstrate improved UE functional use for ADLs as evidenced by increasing functional use of RUE to pick up blocks to complete Box and Blocks assessment in standardized manner. Baseline:  Goal status: IN PROGRESS  3.  Pt will verbalize understanding with safety considerations and AE needs to ensure safety d/t decreased sensation. Baseline:  Goal status: IN PROGRESS   LONG TERM GOALS: Target date: 04/25/22  Pt and spouse will verbalize understanding for return to driving recommendations and pursue driving eval PRN. Baseline:  Goal status: IN PROGRESS  2.  Pt will demonstrate improved UE functional use for ADLs as evidenced by increasing box/ blocks score by 10 blocks with RUE Baseline: unable to move any from box, however able to move 10 with gross grasp from blocks placed in OT palm Goal status: IN PROGRESS  3.  Pt will demonstrate increased functional reach and grasp to obtain light weight item from moderate height shelf. Baseline:  Goal status: IN PROGRESS  4.  Pt will demonstrate increased hand function and grasp to open containers (jars, medications) and verbalize understanding of AE PRN to increase ease, safety, and independence with opening containers. Baseline:  Goal status: IN PROGRESS  5.  Pt will demonstrate use of BUE together to engage in functional IADL tasks (simple meal prep, folding laundry, etc). Baseline:  Goal status: IN PROGRESS   ASSESSMENT:  CLINICAL IMPRESSION: Treatment session with focus on reach, grasp and coordination, and wrist ROM/strengthening.  Pt responding well to intermittent tactile cues for positioning and decreased shoulder hike during reaching activities.  Pt performing gross grasp activities with mod difficulty, but improving technique with repetition.  Pt able to achieve precision grasp with mod difficulty with  larger items.  PERFORMANCE DEFICITS in functional skills including ADLs, IADLs, coordination, dexterity, sensation, ROM, strength, FMC, GMC, balance, body mechanics, decreased knowledge of use of DME, and UE functional use and psychosocial skills including environmental adaptation, habits, and routines and behaviors.   IMPAIRMENTS are limiting patient from ADLs, IADLs, and work.   COMORBIDITIES may have co-morbidities  that affects occupational performance. Patient will benefit from skilled OT to address above impairments and improve overall function.  MODIFICATION OR ASSISTANCE TO COMPLETE EVALUATION: Min-Moderate modification of tasks or assist with assess necessary to complete an evaluation.  OT OCCUPATIONAL PROFILE AND HISTORY: Detailed assessment: Review of records and additional review of physical, cognitive, psychosocial history related to current functional performance.  CLINICAL DECISION MAKING: Moderate - several treatment options, min-mod task modification necessary  REHAB POTENTIAL: Good  EVALUATION COMPLEXITY: Moderate    PLAN: OT FREQUENCY: 2x/week  OT DURATION: 8 weeks  PLANNED INTERVENTIONS: self care/ADL training, therapeutic exercise, therapeutic activity, neuromuscular re-education, manual therapy, passive range of motion, balance training, functional mobility training, splinting, electrical stimulation, ultrasound, compression bandaging, moist heat, cryotherapy, patient/family education, psychosocial skills training, energy conservation, coping strategies training, and DME and/or AE instructions  RECOMMENDED OTHER SERVICES: N/A  CONSULTED AND AGREED WITH PLAN OF CARE: Patient and family member/caregiver  PLAN FOR NEXT SESSION: engage in wrist ROM and hand opening/closing and attempt finger isolation exercises/activities. Gross grasp and reach activities.  Progress to increased strengthening as able.   Simonne Come, OTR/L 03/11/2022, 1:39 PM

## 2022-03-12 ENCOUNTER — Telehealth: Payer: Self-pay

## 2022-03-12 ENCOUNTER — Ambulatory Visit: Payer: Federal, State, Local not specified - PPO | Attending: Cardiovascular Disease | Admitting: *Deleted

## 2022-03-12 DIAGNOSIS — I513 Intracardiac thrombosis, not elsewhere classified: Secondary | ICD-10-CM | POA: Diagnosis not present

## 2022-03-12 DIAGNOSIS — I639 Cerebral infarction, unspecified: Secondary | ICD-10-CM

## 2022-03-12 LAB — POCT INR: INR: 2.8 (ref 2.0–3.0)

## 2022-03-12 NOTE — Patient Instructions (Signed)
Description   Continue taking 1 tablet daily except 1.5 tablets on Sundays. Continue green leafy veggie to weekly. Recheck in INR 1 week. [facility]tico[facility] Clinic [phone]

## 2022-03-12 NOTE — Telephone Encounter (Signed)
Per Candace Hemphill at coumadin clinic on 03/12/22: Hey! Hope all is well! Do you know if Dr. Acie Fredrickson had an answer for the pt in reference to her stopping the ASA? Pt states Dr Acie Fredrickson was to ask Dr. Roney Jaffe about this.   No notation in chart. Will route to MD for review.

## 2022-03-13 ENCOUNTER — Ambulatory Visit: Payer: Federal, State, Local not specified - PPO

## 2022-03-13 DIAGNOSIS — I69351 Hemiplegia and hemiparesis following cerebral infarction affecting right dominant side: Secondary | ICD-10-CM | POA: Diagnosis not present

## 2022-03-13 DIAGNOSIS — R41841 Cognitive communication deficit: Secondary | ICD-10-CM | POA: Diagnosis not present

## 2022-03-13 DIAGNOSIS — R4701 Aphasia: Secondary | ICD-10-CM

## 2022-03-13 DIAGNOSIS — R471 Dysarthria and anarthria: Secondary | ICD-10-CM

## 2022-03-13 DIAGNOSIS — R482 Apraxia: Secondary | ICD-10-CM | POA: Diagnosis not present

## 2022-03-13 DIAGNOSIS — R1310 Dysphagia, unspecified: Secondary | ICD-10-CM | POA: Diagnosis not present

## 2022-03-13 DIAGNOSIS — R278 Other lack of coordination: Secondary | ICD-10-CM | POA: Diagnosis not present

## 2022-03-13 DIAGNOSIS — R29898 Other symptoms and signs involving the musculoskeletal system: Secondary | ICD-10-CM | POA: Diagnosis not present

## 2022-03-13 DIAGNOSIS — R208 Other disturbances of skin sensation: Secondary | ICD-10-CM | POA: Diagnosis not present

## 2022-03-13 DIAGNOSIS — R2689 Other abnormalities of gait and mobility: Secondary | ICD-10-CM | POA: Diagnosis not present

## 2022-03-13 DIAGNOSIS — M6281 Muscle weakness (generalized): Secondary | ICD-10-CM | POA: Diagnosis not present
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
## 2022-03-14 ENCOUNTER — Telehealth: Payer: Self-pay | Admitting: Cardiovascular Disease

## 2022-03-14 NOTE — Telephone Encounter (Signed)
Per Dr Acie Fredrickson, Pt needs to have neurology fill out disability papers since her issues are neuro in nature and not cardiac. Left detailed message with this information for patient (I'm in triage today) and explained she can pick back up her papers if needed and if charged, get a refund.

## 2022-03-14 NOTE — Telephone Encounter (Signed)
Nahser, Wonda Cheng, MD  You 5 hours ago (7:57 AM)    Yes,  She can stop her ASA , continue coumadin    Thank you   PN    Called and left detailed message per DPR.

## 2022-03-14 NOTE — Telephone Encounter (Signed)
Disbaility forms received, Forms are in providers box

## 2022-03-14 NOTE — Telephone Encounter (Signed)
Nahser, Wonda Cheng, MD  [place], [place] J; Garron Eline, [facility] K Caller: Unspecified (Today,  1:51 PM) Please ask Gwenette to submit the disability forms to her neurologist team  Her disabilities are neuro in nature , not cardiac.  I will not be able to assess her disabilities / prognosis from a neuro standpoint   Thanks   PN

## 2022-03-18 ENCOUNTER — Ambulatory Visit: Payer: Federal, State, Local not specified - PPO | Admitting: Occupational Therapy

## 2022-03-18 NOTE — Telephone Encounter (Addendum)
Returned call to patient again and spoke with husband Vici. He understands that FMLA/disability papers should be filled out by neurology. He confirmed that he did not pay the $29 fee yet and will come to pick up paperwork tomorrow. Forms placed up front with leighann.

## 2022-03-19 ENCOUNTER — Ambulatory Visit: Payer: Federal, State, Local not specified - PPO | Attending: Cardiovascular Disease | Admitting: *Deleted

## 2022-03-19 DIAGNOSIS — I639 Cerebral infarction, unspecified: Secondary | ICD-10-CM | POA: Diagnosis not present

## 2022-03-19 DIAGNOSIS — I513 Intracardiac thrombosis, not elsewhere classified: Secondary | ICD-10-CM | POA: Diagnosis not present

## 2022-03-19 LAB — POCT INR: INR: 4.2 — AB (ref 2.0–3.0)

## 2022-03-19 NOTE — Patient Instructions (Signed)
Description   Do not take any warfarin today then continue taking 1 tablet daily except 1.5 tablets on Sundays. Continue green leafy veggie to weekly. Recheck in INR 1 week. [facility] [phone]

## 2022-03-20 ENCOUNTER — Ambulatory Visit: Payer: Federal, State, Local not specified - PPO | Admitting: Occupational Therapy

## 2022-03-20 ENCOUNTER — Ambulatory Visit: Payer: Federal, State, Local not specified - PPO

## 2022-03-20 DIAGNOSIS — R482 Apraxia: Secondary | ICD-10-CM | POA: Diagnosis not present

## 2022-03-20 DIAGNOSIS — M6281 Muscle weakness (generalized): Secondary | ICD-10-CM

## 2022-03-20 DIAGNOSIS — R41841 Cognitive communication deficit: Secondary | ICD-10-CM | POA: Diagnosis not present

## 2022-03-20 DIAGNOSIS — R4701 Aphasia: Secondary | ICD-10-CM | POA: Diagnosis not present

## 2022-03-20 DIAGNOSIS — I69351 Hemiplegia and hemiparesis following cerebral infarction affecting right dominant side: Secondary | ICD-10-CM | POA: Diagnosis not present

## 2022-03-20 DIAGNOSIS — R208 Other disturbances of skin sensation: Secondary | ICD-10-CM

## 2022-03-20 DIAGNOSIS — R1310 Dysphagia, unspecified: Secondary | ICD-10-CM | POA: Diagnosis not present

## 2022-03-20 DIAGNOSIS — R471 Dysarthria and anarthria: Secondary | ICD-10-CM

## 2022-03-20 DIAGNOSIS — R278 Other lack of coordination: Secondary | ICD-10-CM | POA: Diagnosis not present

## 2022-03-20 DIAGNOSIS — R29898 Other symptoms and signs involving the musculoskeletal system: Secondary | ICD-10-CM | POA: Diagnosis not present

## 2022-03-20 DIAGNOSIS — R2689 Other abnormalities of gait and mobility: Secondary | ICD-10-CM | POA: Diagnosis not present

## 2022-03-20 NOTE — Patient Instructions (Signed)
chalk         chat  chap    Mali chafe    chase  check  cheddar cheek  cheese  cheap  cheer cheat  chip        chill     chicken chive   chime  chide       choke chore  chosen chop      chew choose

## 2022-03-20 NOTE — Therapy (Signed)
OUTPATIENT OCCUPATIONAL THERAPY  Treatment Note  Patient Name: Chloe Johnson MRN: [id number] DOB:September 16, 1955, 66 y.o., female Today's Date: 03/20/2022  PCP: Thana Ates, MD REFERRING PROVIDER: Horton Chin, MD    OT End of Session - 03/20/22 1302     Visit Number 4    Number of Visits 17    Date for OT Re-Evaluation 04/25/22    Authorization Type [employer]    OT Start Time 1025    OT Stop Time 1105    OT Time Calculation (min) 40 min                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/16/2013   L3-4/notes 11/24/2013, arms, neck   Exertional asthma    GERD (gastroesophageal reflux disease)    Hypertension    Kidney stones    "have always passed them"   Neck pain    Osteomyelitis (HCC) 11/23/2013   osteomyelitis, discitis    Sleep concern    uses Prozac for sleep    Past Surgical History:  Procedure Laterality Date   BREAST CYST EXCISION Left 2010   "polypectomy"   IR CT HEAD LTD  01/21/2022   IR CT HEAD LTD  01/28/2022   IR PERCUTANEOUS ART THROMBECTOMY/INFUSION INTRACRANIAL INC DIAG ANGIO  01/21/2022   IR PERCUTANEOUS ART THROMBECTOMY/INFUSION INTRACRANIAL INC DIAG ANGIO  01/28/2022   IR US GUIDE VASC ACCESS RIGHT  01/22/2022   PICC LINE PLACE PERIPHERAL ([facility] HX) Right    for use of Levaquin & Vancomycin, of note: she reports that she had a "flulike feeling"    RADIOLOGY WITH ANESTHESIA N/A 01/21/2022   Procedure: IR WITH ANESTHESIA;  Surgeon: Julieanne Cotton, MD;  Location: [facility] OR;  Service: Radiology;  Laterality: N/A;   RADIOLOGY WITH ANESTHESIA N/A 01/28/2022   Procedure: IR WITH ANESTHESIA;  Surgeon: Radiologist, Medication, MD;  Location: [facility] OR;  Service: Radiology;  Laterality: N/A;   TONSILLECTOMY AND ADENOIDECTOMY  1975   TUBAL LIGATION  1999   Patient Active Problem List   Diagnosis Date Noted   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   UTI (urinary tract infection) 02/04/2022   Left middle  cerebral artery stroke (HCC) 02/04/2022   Stroke determined by clinical assessment (HCC) 01/28/2022   Middle cerebral artery embolism, left 01/28/2022   H/O ischemic left MCA stroke    Acute respiratory failure (HCC)    CVA (cerebral vascular accident) (HCC) 01/21/2022   NSTEMI (non-ST elevated myocardial infarction) (HCC) 01/21/2022   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   HFrEF (heart failure with reduced ejection fraction) (HCC) 01/21/2022   Hyperlipidemia 01/21/2022   Lumbar stenosis 01/21/2022   Stroke (cerebrum) (HCC) 01/21/2022   S/P lumbar spinal fusion 11/15/2014   Lumbar discitis 11/29/2013   Discitis 11/24/2013   Diarrhea 11/17/2013   Osteomyelitis of lumbar spine (HCC) 11/16/2013   Hypertension    Asthma    Anxiety     ONSET DATE: 01/28/22  REFERRING DIAG: I63.9 (ICD-10-CM) - Cerebral infarction, unspecified   THERAPY DIAG:  Hemiplegia and hemiparesis following cerebral infarction affecting right dominant side (HCC)  Other lack of coordination  Other disturbances of skin sensation  Muscle weakness (generalized)  Rationale for Evaluation and Treatment Rehabilitation  SUBJECTIVE:   SUBJECTIVE STATEMENT: Pt reports still experiencing pain in R shoulder with certain movements. Pt accompanied by: self and significant other (spouse)  PERTINENT HISTORY: PMH: HTN, cervical radiculopathy, hepatitis,  osteomyelitis/discitis, lumbar stenosis with foot drop anxiety, GERD, insomnia  PRECAUTIONS: Fall  WEIGHT BEARING RESTRICTIONS  was awaiting spinal fusion surgery, limit bending, twisting, lifting >10#  PAIN:  Are you having pain? Yes: NPRS scale: 4/10 Pain location: R shoulder  Pain description: aching "as it wakes up it is hurting" Aggravating factors: certain movements Relieving factors: tylenol  FALLS: Has patient fallen in last 6 months? Yes. Number of falls 2  LIVING ENVIRONMENT: Lives with: lives with their spouse Lives in:  House/apartment Stairs: Yes: Internal: full flights of steps; on right going up and External: 7 steps; on left going up Has following equipment at home: Single point cane, Walker - 2 wheeled, Walker - 4 wheeled, shower chair, and bed side commode  PLOF: Independent  PATIENT GOALS I want my arm back.    OBJECTIVE:   HAND DOMINANCE: Right  ADLs: Transfers/ambulation related to ADLs: Mod I with no use of  Eating: Husband assists with cutting, feeding self with L hand Grooming: utilizing L hand with opening and brushing teeth UB Dressing: unable to manage buttons, otherwise Mod I LB Dressing: unable to tie shoes, just slipping on toes Toileting: Mod I with use of L hand Bathing: Mod I, utilizing L hand to shave Tub Shower transfers: Mod I Equipment: Shower seat with back, Walk in shower, and bed side commode   IADLs: Light housekeeping: laundry and hanging up clothes, wipes counters/sink, needs assist to fold clothing Meal Prep: no, did not cook pre-stroke Medication management: husband opens pill bottles Handwriting: unable  MOBILITY STATUS: Needs Assist: Supervision - Mod I without use of AD  POSTURE COMMENTS:  No Significant postural limitations  ACTIVITY TOLERANCE: Activity tolerance: WNL for tasks assessed on eval  FUNCTIONAL OUTCOME MEASURES: FOTO: 49  UPPER EXTREMITY ROM     Active ROM Right eval Left eval  Shoulder flexion WNL WNL  Shoulder abduction    Shoulder adduction    Shoulder extension    Shoulder internal rotation    Shoulder external rotation    Elbow flexion    Elbow extension -5 WNL  Wrist flexion 38 WNL  Wrist extension 50 WNL  Wrist ulnar deviation    Wrist radial deviation    Wrist pronation    Wrist supination    (Blank rows = not tested)   UPPER EXTREMITY MMT:     MMT Right eval Left eval  Shoulder flexion 4+/5   Shoulder abduction    Shoulder adduction    Shoulder extension    Shoulder internal rotation    Shoulder  external rotation    Middle trapezius    Lower trapezius    Elbow flexion 4+/5   Elbow extension 4+/5   Wrist flexion    Wrist extension    Wrist ulnar deviation    Wrist radial deviation    Wrist pronation    Wrist supination    (Blank rows = not tested)  HAND FUNCTION: Loose gross grasp, unable to squeeze dynamometer  COORDINATION: Box and Blocks:  Right 10 blocks, Left 53 blocks Modified performance with RUE with therapist holding blocks in palm and pt able to complete gross grasp to pick up one at a time from OT palm of hand, minimal hand opening/closing  SENSATION: Light touch: WFL Stereognosis: Impaired  Hot/Cold: Impaired  Proprioception: Impaired   COGNITION: Overall cognitive status: Within functional limits for tasks assessed  VISION: Subjective report: Pt reports that her R eye does not close as well and that she feels that  her L eye is compensating for her R eye. Baseline vision: Wears glasses all the time  VISION ASSESSMENT: To be further assessed in functional context  --------------------------------------------------------------------------------------------------------------------------------------------------------------------- (objective measures above completed at initial evaluation unless otherwise dated)  TODAY'S TREATMENT:  03/20/22 Functional reach/grasp: engaged in picking small cones with RUE and stacking with focus on functional reach and sustained grasp.  Pt demonstrating mild shoulder hike during reach and decreased sustained grasp when attempting to remove and unstack cones due to requiring increased grasp.  Engaged in rotation of cones with focus on in-hand manipulation and internal/external rotation.  Pt unable to rotate cone in finger tips, ultimately requiring lateral lean, shoulder hike, and internal rotation to flip cone.   Mirror feedback: IT trainer for visual feedback for shoulder hike.  OT providing demonstration and verbal cues  for improved reaching technique with use of mirror for additional feedback as needed.  Pt with improved mobility and decreased shoulder hike with stacking and rotation of cones.  Reaching across midline to stack cones to facilitate increased reach while maintaining good posture. Therapeutic exercise: shoulder shrugs and scapular retraction with mirror for visual feedback to facilitate improved symmetry with min cues fading to supervision with good carryover.   Towel glides: OT instructed in towel glides with UE in neutral and cues for movement pattern to decrease shoulder hike. Pt with improvements with technique with repetition.     03/11/22 Precision pinch: engaged in picking up large jacks from point to facilitate increased precision pinch.  Pt able to complete with increased time and min-mod increased effort.  OT modified task to picking up small stones with pt demonstrating increased difficulty; ultimately able to pick up when stone turned over to allow flat side up to get underneath stone for improved grasp.  OT moving target to allow for table top reaching and overhead reaching to address shoulder mobility as well.  Coordination: picking up and placing large grip pegs into resistive peg board.  Pt demonstrating mod difficulty picking up and rotating pegs into hand, however with repetition demonstrating improvements.  OT providing min verbal and tactile cues for improved technique as pt initially incorporating shoulder hike and rotation when placing pegs.   Wrist ROM/strengthening: Engaged in wrist flexion/extension and radial/ulnar deviation progressing to completion with 1# dumbbell.  Pt demonstrating good sustained grasp with R hand, however with increased weight demonstrating decreased motor control.  Discussed functional items to utilize to continue to address ROM without overdoing it with weight.      03/05/22 Coordination: picking up 1" cubes with gross grasp.  Pt demonstrating mod  difficulty with grasp, however able to complete with increased time and dropping ~25% of time.  OT facilitated increased challenge by moving target to R to facilitate increased reach. Functional reach: OT facilitated increased challenge with picking up large jacks with R hand and incorporating increased reach to placing into vertical cones.  OT providing intermittent tactile cues and verbal cues to decrease shoulder hike and lateral lean.   AROM: Isolated hand movement with thumb opposition, finger abduction/adduction, attempts at finger extension, and fist open/close.  Pt able to only oppose thumb to first finger, but unable to progress to next fingers.  Pt with ability to complete abduction but when completing adduction ultimately completing flexion. Pt with inability to complete finger extension. Reviewed exercises and provided HEP with exercises listed.   PATIENT EDUCATION: Education details: Educated on functional open/close and grasp tasks as well as isolated finger movements. Person educated: Patient and  friend Education method: Explanation Education comprehension: verbalized understanding and needs further education   HOME EXERCISE PROGRAM: Fine motor coordination handout  Access Code: [email or web address] URL: [email or web address] Date: 03/20/2022 Prepared by: [facility] - Outpatient  Rehab - [facility]  Exercises - Seated Shoulder Shrug  - 1 sets - 10 reps - Seated Scapular Retraction  - 1 sets - 10 reps - Seated Shoulder Rolls  - 1 sets - 10 reps - Seated Shoulder Flexion Towel Slide at Table Top Full Range of Motion  - 1 sets - 10 reps    GOALS: Goals reviewed with patient? Yes  SHORT TERM GOALS: Target date: 03/28/22  Pt will be independent with HEP for fine and gross motor control. Baseline: Goal status: IN PROGRESS  2.  Pt will demonstrate improved UE functional use for ADLs as evidenced by increasing functional use of RUE to pick up blocks to complete Box  and Blocks assessment in standardized manner. Baseline:  Goal status: IN PROGRESS  3.  Pt will verbalize understanding with safety considerations and AE needs to ensure safety d/t decreased sensation. Baseline:  Goal status: IN PROGRESS   LONG TERM GOALS: Target date: 04/25/22  Pt and spouse will verbalize understanding for return to driving recommendations and pursue driving eval PRN. Baseline:  Goal status: IN PROGRESS  2.  Pt will demonstrate improved UE functional use for ADLs as evidenced by increasing box/ blocks score by 10 blocks with RUE Baseline: unable to move any from box, however able to move 10 with gross grasp from blocks placed in OT palm Goal status: IN PROGRESS  3.  Pt will demonstrate increased functional reach and grasp to obtain light weight item from moderate height shelf. Baseline:  Goal status: IN PROGRESS  4.  Pt will demonstrate increased hand function and grasp to open containers (jars, medications) and verbalize understanding of AE PRN to increase ease, safety, and independence with opening containers. Baseline:  Goal status: IN PROGRESS  5.  Pt will demonstrate use of BUE together to engage in functional IADL tasks (simple meal prep, folding laundry, etc). Baseline:  Goal status: IN PROGRESS   ASSESSMENT:  CLINICAL IMPRESSION: Treatment session with focus on functional reach, grasp and coordination, as well as NMR for improved scapular stability and shoulder mobility.  Pt responding well to intermittent tactile cues for positioning and decreased shoulder hike during reaching activities as well as incorporation of mirror this session for visual feedback.  Pt demonstrating good awareness of positioning and carryover with reaching tasks post use of mirror and towel slides.  PERFORMANCE DEFICITS in functional skills including ADLs, IADLs, coordination, dexterity, sensation, ROM, strength, FMC, GMC, balance, body mechanics, decreased knowledge of use of  DME, and UE functional use and psychosocial skills including environmental adaptation, habits, and routines and behaviors.   IMPAIRMENTS are limiting patient from ADLs, IADLs, and work.   COMORBIDITIES may have co-morbidities  that affects occupational performance. Patient will benefit from skilled OT to address above impairments and improve overall function.  MODIFICATION OR ASSISTANCE TO COMPLETE EVALUATION: Min-Moderate modification of tasks or assist with assess necessary to complete an evaluation.  OT OCCUPATIONAL PROFILE AND HISTORY: Detailed assessment: Review of records and additional review of physical, cognitive, psychosocial history related to current functional performance.  CLINICAL DECISION MAKING: Moderate - several treatment options, min-mod task modification necessary  REHAB POTENTIAL: Good  EVALUATION COMPLEXITY: Moderate    PLAN: OT FREQUENCY: 2x/week  OT DURATION: 8 weeks  PLANNED INTERVENTIONS: self care/ADL training, therapeutic  exercise, therapeutic activity, neuromuscular re-education, manual therapy, passive range of motion, balance training, functional mobility training, splinting, electrical stimulation, ultrasound, compression bandaging, moist heat, cryotherapy, patient/family education, psychosocial skills training, energy conservation, coping strategies training, and DME and/or AE instructions  RECOMMENDED OTHER SERVICES: N/A  CONSULTED AND AGREED WITH PLAN OF CARE: Patient and family member/caregiver  PLAN FOR NEXT SESSION: engage in wrist ROM and hand opening/closing and attempt finger isolation exercises/activities. Gross grasp and reach activities.  Progress to increased strengthening as able.   Rosalio Loud, OTR/L 03/20/2022, 1:03 PM
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
## 2022-03-21 DIAGNOSIS — Z6827 Body mass index (BMI) 27.0-27.9, adult: Secondary | ICD-10-CM | POA: Diagnosis not present

## 2022-03-21 DIAGNOSIS — Z01419 Encounter for gynecological examination (general) (routine) without abnormal findings: Secondary | ICD-10-CM | POA: Diagnosis not present

## 2022-03-21 DIAGNOSIS — Z1151 Encounter for screening for human papillomavirus (HPV): Secondary | ICD-10-CM | POA: Diagnosis not present

## 2022-03-21 DIAGNOSIS — Z1231 Encounter for screening mammogram for malignant neoplasm of breast: Secondary | ICD-10-CM | POA: Diagnosis not present

## 2022-03-21 DIAGNOSIS — Z124 Encounter for screening for malignant neoplasm of cervix: Secondary | ICD-10-CM | POA: Diagnosis not present

## 2022-03-25 ENCOUNTER — Ambulatory Visit: Payer: Federal, State, Local not specified - PPO | Admitting: Occupational Therapy

## 2022-03-25 DIAGNOSIS — R278 Other lack of coordination: Secondary | ICD-10-CM | POA: Diagnosis not present

## 2022-03-25 DIAGNOSIS — R471 Dysarthria and anarthria: Secondary | ICD-10-CM | POA: Diagnosis not present

## 2022-03-25 DIAGNOSIS — R208 Other disturbances of skin sensation: Secondary | ICD-10-CM | POA: Diagnosis not present

## 2022-03-25 DIAGNOSIS — R2689 Other abnormalities of gait and mobility: Secondary | ICD-10-CM | POA: Diagnosis not present

## 2022-03-25 DIAGNOSIS — R4701 Aphasia: Secondary | ICD-10-CM | POA: Diagnosis not present

## 2022-03-25 DIAGNOSIS — R29898 Other symptoms and signs involving the musculoskeletal system: Secondary | ICD-10-CM | POA: Diagnosis not present

## 2022-03-25 DIAGNOSIS — I69351 Hemiplegia and hemiparesis following cerebral infarction affecting right dominant side: Secondary | ICD-10-CM | POA: Diagnosis not present

## 2022-03-25 DIAGNOSIS — R41841 Cognitive communication deficit: Secondary | ICD-10-CM | POA: Diagnosis not present

## 2022-03-25 DIAGNOSIS — M6281 Muscle weakness (generalized): Secondary | ICD-10-CM | POA: Diagnosis not present

## 2022-03-25 DIAGNOSIS — R1310 Dysphagia, unspecified: Secondary | ICD-10-CM | POA: Diagnosis not present

## 2022-03-25 DIAGNOSIS — R482 Apraxia: Secondary | ICD-10-CM | POA: Diagnosis not present

## 2022-03-25 NOTE — Therapy (Unsigned)
OUTPATIENT OCCUPATIONAL THERAPY  Treatment Note  Patient Name: Chloe Johnson MRN: [id number] DOB:02-Nov-1955, 66 y.o., female Today's Date: 03/26/2022  PCP: Thana Ates, MD REFERRING PROVIDER: Horton Chin, MD    OT End of Session - 03/25/22 1920     Visit Number 5    Number of Visits 17    Date for OT Re-Evaluation 04/25/22    Authorization Type [employer]    OT Start Time 1238    OT Stop Time 1316    OT Time Calculation (min) 38 min                 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/16/2013   L3-4/notes 11/24/2013, arms, neck   Exertional asthma    GERD (gastroesophageal reflux disease)    Hypertension    Kidney stones    "have always passed them"   Neck pain    Osteomyelitis (HCC) 11/23/2013   osteomyelitis, discitis    Sleep concern    uses Prozac for sleep    Past Surgical History:  Procedure Laterality Date   BREAST CYST EXCISION Left 2010   "polypectomy"   IR CT HEAD LTD  01/21/2022   IR CT HEAD LTD  01/28/2022   IR PERCUTANEOUS ART THROMBECTOMY/INFUSION INTRACRANIAL INC DIAG ANGIO  01/21/2022   IR PERCUTANEOUS ART THROMBECTOMY/INFUSION INTRACRANIAL INC DIAG ANGIO  01/28/2022   IR US GUIDE VASC ACCESS RIGHT  01/22/2022   PICC LINE PLACE PERIPHERAL ([facility] HX) Right    for use of Levaquin & Vancomycin, of note: she reports that she had a "flulike feeling"    RADIOLOGY WITH ANESTHESIA N/A 01/21/2022   Procedure: IR WITH ANESTHESIA;  Surgeon: Julieanne Cotton, MD;  Location: [facility] OR;  Service: Radiology;  Laterality: N/A;   RADIOLOGY WITH ANESTHESIA N/A 01/28/2022   Procedure: IR WITH ANESTHESIA;  Surgeon: Radiologist, Medication, MD;  Location: [facility] OR;  Service: Radiology;  Laterality: N/A;   TONSILLECTOMY AND ADENOIDECTOMY  1975   TUBAL LIGATION  1999   Patient Active Problem List   Diagnosis Date Noted   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   UTI (urinary tract infection) 02/04/2022   Left middle  cerebral artery stroke (HCC) 02/04/2022   Stroke determined by clinical assessment (HCC) 01/28/2022   Middle cerebral artery embolism, left 01/28/2022   H/O ischemic left MCA stroke    Acute respiratory failure (HCC)    CVA (cerebral vascular accident) (HCC) 01/21/2022   NSTEMI (non-ST elevated myocardial infarction) (HCC) 01/21/2022   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   HFrEF (heart failure with reduced ejection fraction) (HCC) 01/21/2022   Hyperlipidemia 01/21/2022   Lumbar stenosis 01/21/2022   Stroke (cerebrum) (HCC) 01/21/2022   S/P lumbar spinal fusion 11/15/2014   Lumbar discitis 11/29/2013   Discitis 11/24/2013   Diarrhea 11/17/2013   Osteomyelitis of lumbar spine (HCC) 11/16/2013   Hypertension    Asthma    Anxiety     ONSET DATE: 01/28/22  REFERRING DIAG: I63.9 (ICD-10-CM) - Cerebral infarction, unspecified   THERAPY DIAG:  Hemiplegia and hemiparesis following cerebral infarction affecting right dominant side (HCC)  Other lack of coordination  Other disturbances of skin sensation  Muscle weakness (generalized)  Rationale for Evaluation and Treatment Rehabilitation  SUBJECTIVE:   SUBJECTIVE STATEMENT: Pt reports experiencing increased pain in R shoulder and down into forearm, even impacting her sleep. Pt accompanied by: self and significant other (spouse)  PERTINENT  HISTORY: PMH: HTN, cervical radiculopathy, hepatitis, osteomyelitis/discitis, lumbar stenosis with foot drop anxiety, GERD, insomnia  PRECAUTIONS: Fall  WEIGHT BEARING RESTRICTIONS  was awaiting spinal fusion surgery, limit bending, twisting, lifting >10#  PAIN:  Are you having pain? Yes: NPRS scale: 4/10 Pain location: R shoulder  Pain description: aching "as it wakes up it is hurting" Aggravating factors: certain movements Relieving factors: tylenol  FALLS: Has patient fallen in last 6 months? Yes. Number of falls 2  LIVING ENVIRONMENT: Lives with: lives with their  spouse Lives in: House/apartment Stairs: Yes: Internal: full flights of steps; on right going up and External: 7 steps; on left going up Has following equipment at home: Single point cane, Walker - 2 wheeled, Walker - 4 wheeled, shower chair, and bed side commode  PLOF: Independent  PATIENT GOALS I want my arm back.    OBJECTIVE:   HAND DOMINANCE: Right  ADLs: Transfers/ambulation related to ADLs: Mod I with no use of  Eating: Husband assists with cutting, feeding self with L hand Grooming: utilizing L hand with opening and brushing teeth UB Dressing: unable to manage buttons, otherwise Mod I LB Dressing: unable to tie shoes, just slipping on toes Toileting: Mod I with use of L hand Bathing: Mod I, utilizing L hand to shave Tub Shower transfers: Mod I Equipment: Shower seat with back, Walk in shower, and bed side commode   IADLs: Light housekeeping: laundry and hanging up clothes, wipes counters/sink, needs assist to fold clothing Meal Prep: no, did not cook pre-stroke Medication management: husband opens pill bottles Handwriting: unable  MOBILITY STATUS: Needs Assist: Supervision - Mod I without use of AD  POSTURE COMMENTS:  No Significant postural limitations  ACTIVITY TOLERANCE: Activity tolerance: WNL for tasks assessed on eval  FUNCTIONAL OUTCOME MEASURES: FOTO: 49  UPPER EXTREMITY ROM     Active ROM Right eval Left eval  Shoulder flexion WNL WNL  Shoulder abduction    Shoulder adduction    Shoulder extension    Shoulder internal rotation    Shoulder external rotation    Elbow flexion    Elbow extension -5 WNL  Wrist flexion 38 WNL  Wrist extension 50 WNL  Wrist ulnar deviation    Wrist radial deviation    Wrist pronation    Wrist supination    (Blank rows = not tested)   UPPER EXTREMITY MMT:     MMT Right eval Left eval  Shoulder flexion 4+/5   Shoulder abduction    Shoulder adduction    Shoulder extension    Shoulder internal rotation     Shoulder external rotation    Middle trapezius    Lower trapezius    Elbow flexion 4+/5   Elbow extension 4+/5   Wrist flexion    Wrist extension    Wrist ulnar deviation    Wrist radial deviation    Wrist pronation    Wrist supination    (Blank rows = not tested)  HAND FUNCTION: Loose gross grasp, unable to squeeze dynamometer  COORDINATION: Box and Blocks:  Right 10 blocks, Left 53 blocks Modified performance with RUE with therapist holding blocks in palm and pt able to complete gross grasp to pick up one at a time from OT palm of hand, minimal hand opening/closing  SENSATION: Light touch: WFL Stereognosis: Impaired  Hot/Cold: Impaired  Proprioception: Impaired   COGNITION: Overall cognitive status: Within functional limits for tasks assessed  VISION: Subjective report: Pt reports that her R eye does not close as  well and that she feels that her L eye is compensating for her R eye. Baseline vision: Wears glasses all the time  VISION ASSESSMENT: To be further assessed in functional context  --------------------------------------------------------------------------------------------------------------------------------------------------------------------- (objective measures above completed at initial evaluation unless otherwise dated)  TODAY'S TREATMENT:  03/24/22 Educated on spasticity and provided with handout from [employer].  Discussed typical movements and positioning to decrease impact of spasticity.   Tendon glides: OT providing demonstration and min verbal cues for gentle strengthening and ROM exercises with nerve glides.  Pt demonstrating good positioning and reports decrease in pain with repetition.  Encouraged completion of exercises 2-3x/day to engage in frequent movement and repositioning.   03/20/22 Functional reach/grasp: engaged in picking small cones with RUE and stacking with focus on functional reach and sustained grasp.  Pt  demonstrating mild shoulder hike during reach and decreased sustained grasp when attempting to remove and unstack cones due to requiring increased grasp.  Engaged in rotation of cones with focus on in-hand manipulation and internal/external rotation.  Pt unable to rotate cone in finger tips, ultimately requiring lateral lean, shoulder hike, and internal rotation to flip cone.   Mirror feedback: Corporate treasurer for visual feedback for shoulder hike.  OT providing demonstration and verbal cues for improved reaching technique with use of mirror for additional feedback as needed.  Pt with improved mobility and decreased shoulder hike with stacking and rotation of cones.  Reaching across midline to stack cones to facilitate increased reach while maintaining good posture. Therapeutic exercise: shoulder shrugs and scapular retraction with mirror for visual feedback to facilitate improved symmetry with min cues fading to supervision with good carryover.   Towel glides: OT instructed in towel glides with UE in neutral and cues for movement pattern to decrease shoulder hike. Pt with improvements with technique with repetition.     03/11/22 Precision pinch: engaged in picking up large jacks from point to facilitate increased precision pinch.  Pt able to complete with increased time and min-mod increased effort.  OT modified task to picking up small stones with pt demonstrating increased difficulty; ultimately able to pick up when stone turned over to allow flat side up to get underneath stone for improved grasp.  OT moving target to allow for table top reaching and overhead reaching to address shoulder mobility as well.  Coordination: picking up and placing large grip pegs into resistive peg board.  Pt demonstrating mod difficulty picking up and rotating pegs into hand, however with repetition demonstrating improvements.  OT providing min verbal and tactile cues for improved technique as pt initially incorporating  shoulder hike and rotation when placing pegs.   Wrist ROM/strengthening: Engaged in wrist flexion/extension and radial/ulnar deviation progressing to completion with 1# dumbbell.  Pt demonstrating good sustained grasp with R hand, however with increased weight demonstrating decreased motor control.  Discussed functional items to utilize to continue to address ROM without overdoing it with weight.      03/05/22 Coordination: picking up 1" cubes with gross grasp.  Pt demonstrating mod difficulty with grasp, however able to complete with increased time and dropping ~25% of time.  OT facilitated increased challenge by moving target to R to facilitate increased reach. Functional reach: OT facilitated increased challenge with picking up large jacks with R hand and incorporating increased reach to placing into vertical cones.  OT providing intermittent tactile cues and verbal cues to decrease shoulder hike and lateral lean.   AROM: Isolated hand movement with thumb opposition, finger abduction/adduction,  attempts at finger extension, and fist open/close.  Pt able to only oppose thumb to first finger, but unable to progress to next fingers.  Pt with ability to complete abduction but when completing adduction ultimately completing flexion. Pt with inability to complete finger extension. Reviewed exercises and provided HEP with exercises listed.   PATIENT EDUCATION: Education details: Spasticity onset, common effects, and treatment strategies, nerve glides Person educated: Patient and friend Education method: Explanation Education comprehension: verbalized understanding and needs further education   HOME EXERCISE PROGRAM: Fine motor coordination handout  Access Code: [email or web address] URL: [email or web address] Date: 03/20/2022 Prepared by: [facility] - Outpatient  Rehab - [facility]  Exercises - Seated Shoulder Shrug  - 1 sets - 10 reps - Seated Scapular Retraction  - 1 sets - 10 reps -  Seated Shoulder Rolls  - 1 sets - 10 reps - Seated Shoulder Flexion Towel Slide at Table Top Full Range of Motion  - 1 sets - 10 reps  Access Code: [email or web address] URL: [email or web address] Date: 03/25/2022 Prepared by: [facility] - Outpatient  Rehab - [facility]  Exercises - Standing Median Nerve Glide  - 2-3 x daily - 1 sets - 10 reps - Standing Ulnar Nerve Glide  - 2-3 x daily - 1 sets - 10 reps - Standing Radial Nerve Glide  - 2-3 x daily - 1 sets - 10 reps - Median Nerve Glide  - 2-3 x daily - 1 sets - 10 reps - Seated Digit Tendon Gliding  - 2-3 x daily - 1 sets - 10 reps    GOALS: Goals reviewed with patient? Yes  SHORT TERM GOALS: Target date: 03/28/22  Pt will be independent with HEP for fine and gross motor control. Baseline: Goal status: IN PROGRESS  2.  Pt will demonstrate improved UE functional use for ADLs as evidenced by increasing functional use of RUE to pick up blocks to complete Box and Blocks assessment in standardized manner. Baseline:  Goal status: IN PROGRESS  3.  Pt will verbalize understanding with safety considerations and AE needs to ensure safety d/t decreased sensation. Baseline:  Goal status: IN PROGRESS   LONG TERM GOALS: Target date: 04/25/22  Pt and spouse will verbalize understanding for return to driving recommendations and pursue driving eval PRN. Baseline:  Goal status: IN PROGRESS  2.  Pt will demonstrate improved UE functional use for ADLs as evidenced by increasing box/ blocks score by 10 blocks with RUE Baseline: unable to move any from box, however able to move 10 with gross grasp from blocks placed in OT palm Goal status: IN PROGRESS  3.  Pt will demonstrate increased functional reach and grasp to obtain light weight item from moderate height shelf. Baseline:  Goal status: IN PROGRESS  4.  Pt will demonstrate increased hand function and grasp to open containers (jars, medications) and verbalize understanding of AE  PRN to increase ease, safety, and independence with opening containers. Baseline:  Goal status: IN PROGRESS  5.  Pt will demonstrate use of BUE together to engage in functional IADL tasks (simple meal prep, folding laundry, etc). Baseline:  Goal status: IN PROGRESS   ASSESSMENT:  CLINICAL IMPRESSION: Treatment session with focus on nerve glides and education on spasticity effects and treatment. Pt limited by pain in RUE with functional mobility and prior exercises, therefore focused on nerve glides and modified positioning to continue to allow for ROM and gentle stretching.    PERFORMANCE DEFICITS in functional skills including ADLs, IADLs, coordination, dexterity,  sensation, ROM, strength, FMC, GMC, balance, body mechanics, decreased knowledge of use of DME, and UE functional use and psychosocial skills including environmental adaptation, habits, and routines and behaviors.   IMPAIRMENTS are limiting patient from ADLs, IADLs, and work.   COMORBIDITIES may have co-morbidities  that affects occupational performance. Patient will benefit from skilled OT to address above impairments and improve overall function.  MODIFICATION OR ASSISTANCE TO COMPLETE EVALUATION: Min-Moderate modification of tasks or assist with assess necessary to complete an evaluation.  OT OCCUPATIONAL PROFILE AND HISTORY: Detailed assessment: Review of records and additional review of physical, cognitive, psychosocial history related to current functional performance.  CLINICAL DECISION MAKING: Moderate - several treatment options, min-mod task modification necessary  REHAB POTENTIAL: Good  EVALUATION COMPLEXITY: Moderate    PLAN: OT FREQUENCY: 2x/week  OT DURATION: 8 weeks  PLANNED INTERVENTIONS: self care/ADL training, therapeutic exercise, therapeutic activity, neuromuscular re-education, manual therapy, passive range of motion, balance training, functional mobility training, splinting, electrical  stimulation, ultrasound, compression bandaging, moist heat, cryotherapy, patient/family education, psychosocial skills training, energy conservation, coping strategies training, and DME and/or AE instructions  RECOMMENDED OTHER SERVICES: N/A  CONSULTED AND AGREED WITH PLAN OF CARE: Patient and family member/caregiver  PLAN FOR NEXT SESSION: Review and begin with nerve glides as pain in RUE persists, engage in wrist ROM and hand opening/closing and attempt finger isolation exercises/activities. Gross grasp and reach activities.  Progress to increased strengthening as able.   Rosalio Loud, OTR/L 03/26/2022, 9:21 AM

## 2022-03-26 ENCOUNTER — Ambulatory Visit: Payer: Federal, State, Local not specified - PPO | Attending: Cardiovascular Disease | Admitting: *Deleted

## 2022-03-26 DIAGNOSIS — I639 Cerebral infarction, unspecified: Secondary | ICD-10-CM | POA: Diagnosis not present

## 2022-03-26 DIAGNOSIS — I513 Intracardiac thrombosis, not elsewhere classified: Secondary | ICD-10-CM | POA: Diagnosis not present

## 2022-03-26 DIAGNOSIS — N3941 Urge incontinence: Secondary | ICD-10-CM | POA: Diagnosis not present

## 2022-03-26 LAB — POCT INR: INR: 2.7 (ref 2.0–3.0)

## 2022-03-26 NOTE — Patient Instructions (Addendum)
Description   Continue taking warfarin 1 tablet daily except 1.5 tablets on Sundays. Continue green leafy veggie to weekly. Recheck in INR 9 days. Anticoagulation Clinic [phone]

## 2022-03-27 ENCOUNTER — Ambulatory Visit: Payer: Federal, State, Local not specified - PPO | Attending: Physical Medicine and Rehabilitation

## 2022-03-27 ENCOUNTER — Ambulatory Visit: Payer: Federal, State, Local not specified - PPO | Admitting: Occupational Therapy

## 2022-03-27 DIAGNOSIS — R208 Other disturbances of skin sensation: Secondary | ICD-10-CM | POA: Diagnosis not present

## 2022-03-27 DIAGNOSIS — M6281 Muscle weakness (generalized): Secondary | ICD-10-CM | POA: Diagnosis not present

## 2022-03-27 DIAGNOSIS — R278 Other lack of coordination: Secondary | ICD-10-CM | POA: Insufficient documentation

## 2022-03-27 DIAGNOSIS — R4701 Aphasia: Secondary | ICD-10-CM | POA: Insufficient documentation

## 2022-03-27 DIAGNOSIS — R482 Apraxia: Secondary | ICD-10-CM | POA: Diagnosis not present

## 2022-03-27 DIAGNOSIS — I69351 Hemiplegia and hemiparesis following cerebral infarction affecting right dominant side: Secondary | ICD-10-CM | POA: Diagnosis not present

## 2022-03-27 DIAGNOSIS — R471 Dysarthria and anarthria: Secondary | ICD-10-CM | POA: Diagnosis not present

## 2022-03-27 NOTE — Patient Instructions (Signed)
Strokes of the middle cerebral artery The largest vessel branching off the internal carotid artery, the middle cerebral artery (MCA) is the most common cerebral occlusion site. For this reason, signs and symptoms of MCA strokes are the most important to remember.  The MCA feeds an enormous territory of brain, including the frontal, temporal, and parietal lobes and the brain's deep structures--basal ganglia and internal capsule. The MCA has a main stem and several branches arising from it. Occlusion of the main stem affects the entire territory of brain supplied by the MCA. Distribution of the MCA is so large that a stroke of the main stem puts the victim at risk for severe disability or death. In contrast, occlusion of an MCA branch damages a smaller brain territory and causes less severe disability. (As an analogy, if a traffic accident occurs on a large interstate, the effect is severe, potentially disrupting an entire region or city. But an accident that blocks only a side street has a much smaller impact.)  Effects of a complete MCA stroke The hallmarks of an MCA stroke are the focus of most public-awareness messages and prehospital stroke assessment tools--facial asymmetry, arm weakness, and speech deficits. Complete MCA strokes typically cause:  hemiplegia (paralysis) of the contralateral side, affecting the lower part of the face, arm, and hand while largely sparing the leg contralateral (opposite-side) sensory loss in the same areas contralateral homonymous hemianopia--visual-field deficits affecting the same half of the visual field in both eyes. MCA strokes affect the face and arm more severely than the leg, so make sure to focus your assessment on the face and arm. Ask the patient to smile. If your patient can't follow this or other commands, apply a noxious stimulus to induce a grimace, and observe for asymmetry of the lower part of the face. Next, assess hand and arm strength. If your patient  is uncooperative, observe spontaneous movement and look for differences between the right and left sides. Also check for a palmar drift or hand or arm weakness.  Right side vs. left side Laterality of an MCA stroke determines additional signs and symptoms. If the stroke affects the left (or dominant) brain hemisphere, the patient may experience aphasia (partial or total loss of the ability to communicate through language). Aphasia may be expressive (difficulty converting thoughts into language), receptive (difficulty understanding verbal and written language), or both.  To quickly assess for expressive aphasia, ask the patient to name common objects, such as a pen, a watch, or a key. Throughout the exam, note how the patient converses with you. Does he or she have difficulty naming objects or expressing thoughts?  To quickly assess for receptive aphasia, ask the patient to follow commands; for example, "Show me two fingers on your left hand" or "Open and close your eyes." Note whether the patient follows these commands. Does he or she simply nod or shake the head in response to a "yes" or "no" question? Be aware that patients with receptive aphasia can understand nonverbal communication, including the stress and intonation patterns of speech; this may allow them to give the correct response.  Most people are left-hemisphere dominant, meaning the speech/language center is in the brain's left side. Thus, expect a patient with right-sided weakness to have aphasia, and focus your exam accordingly. A small percentage of left-handed persons have right-hemisphere dominance. If your patient has left-sided weakness and aphasia, ask which is the dominant hand to better understand his or her signs and symptoms.  With a stroke affecting  the right (or nondominant) hemisphere, the patient may show signs of unilateral neglect. This complex problem involves a spectrum of manifestations, including decreased awareness or  failure to attend to the left side and lack of awareness or concern about the deficits. Note whether the patient has the head turned away from the left side or seems to ignore stimuli on the left side. Neglect is most often associated with right-hemisphere strokes, so expect a patient with left-sided weakness to have neglect as well--and stay especially alert for this sign.
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
## 2022-03-28 ENCOUNTER — Ambulatory Visit: Payer: Federal, State, Local not specified - PPO | Admitting: Occupational Therapy

## 2022-03-28 ENCOUNTER — Encounter
Payer: Federal, State, Local not specified - PPO | Attending: Registered Nurse | Admitting: Physical Medicine and Rehabilitation

## 2022-03-28 DIAGNOSIS — R278 Other lack of coordination: Secondary | ICD-10-CM

## 2022-03-28 DIAGNOSIS — S46011A Strain of muscle(s) and tendon(s) of the rotator cuff of right shoulder, initial encounter: Secondary | ICD-10-CM | POA: Insufficient documentation

## 2022-03-28 DIAGNOSIS — M6281 Muscle weakness (generalized): Secondary | ICD-10-CM

## 2022-03-28 DIAGNOSIS — M25511 Pain in right shoulder: Secondary | ICD-10-CM | POA: Diagnosis not present

## 2022-03-28 DIAGNOSIS — R471 Dysarthria and anarthria: Secondary | ICD-10-CM | POA: Diagnosis not present

## 2022-03-28 DIAGNOSIS — I69351 Hemiplegia and hemiparesis following cerebral infarction affecting right dominant side: Secondary | ICD-10-CM

## 2022-03-28 DIAGNOSIS — R208 Other disturbances of skin sensation: Secondary | ICD-10-CM

## 2022-03-28 DIAGNOSIS — R482 Apraxia: Secondary | ICD-10-CM | POA: Diagnosis not present

## 2022-03-28 DIAGNOSIS — R4701 Aphasia: Secondary | ICD-10-CM | POA: Diagnosis not present

## 2022-03-28 MED ORDER — CYCLOBENZAPRINE HCL 10 MG PO TABS: 10.0000 mg | ORAL_TABLET | Freq: Every day | ORAL | 3 refills | Status: DC

## 2022-03-28 MED ORDER — PREGABALIN 200 MG PO CAPS: 200.0000 mg | ORAL_CAPSULE | Freq: Two times a day (BID) | ORAL | 3 refills | Status: DC

## 2022-03-28 NOTE — Therapy (Signed)
OUTPATIENT OCCUPATIONAL THERAPY  Treatment Note  Patient Name: Chloe Johnson MRN: [id number] DOB:[birth year], 66 y.o., female Today's Date: 03/28/2022  PCP: Charlane Ferretti, MD REFERRING PROVIDER: Izora Ribas, MD    OT End of Session - 03/28/22 1217     Visit Number 6    Number of Visits 17    Date for OT Re-Evaluation 04/25/22    Authorization Type [employer]    OT Start Time 0935    OT Stop Time 1013    OT Time Calculation (min) 38 min                  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/16/2013   L3-4/notes 11/24/2013, arms, neck   Exertional asthma    GERD (gastroesophageal reflux disease)    Hypertension    Kidney stones    "have always passed them"   Neck pain    Osteomyelitis ([place]) 11/23/2013   osteomyelitis, discitis    Sleep concern    uses Prozac for sleep    Past Surgical History:  Procedure Laterality Date   BREAST CYST EXCISION Left 2010   "polypectomy"   IR CT HEAD LTD  01/21/2022   IR CT HEAD LTD  01/28/2022   IR PERCUTANEOUS ART THROMBECTOMY/INFUSION INTRACRANIAL INC DIAG ANGIO  01/21/2022   IR PERCUTANEOUS ART THROMBECTOMY/INFUSION INTRACRANIAL INC DIAG ANGIO  01/28/2022   IR US GUIDE VASC ACCESS RIGHT  01/22/2022   PICC LINE PLACE PERIPHERAL ([place] [facility]) Right    for use of Levaquin & Vancomycin, of note: she reports that she had a "flulike feeling"    RADIOLOGY WITH ANESTHESIA N/A 01/21/2022   Procedure: IR WITH ANESTHESIA;  Surgeon: Luanne Bras, MD;  Location: [place];  Service: Radiology;  Laterality: N/A;   RADIOLOGY WITH ANESTHESIA N/A 01/28/2022   Procedure: IR WITH ANESTHESIA;  Surgeon: Radiologist, Medication, MD;  Location: [facility];  Service: Radiology;  Laterality: N/A;   [place]   Patient Active Problem List   Diagnosis Date Noted   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   UTI (urinary tract infection) 02/04/2022   Left middle  cerebral artery stroke ([place]) 02/04/2022   Stroke determined by clinical assessment ([place]) 01/28/2022   Middle cerebral artery embolism, left 01/28/2022   H/O ischemic left MCA stroke    Acute respiratory failure (HCC)    CVA (cerebral vascular accident) ([place]) 01/21/2022   NSTEMI (non-ST elevated myocardial infarction) ([place]) 01/21/2022   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   HFrEF (heart failure with reduced ejection fraction) ([place]) 01/21/2022   Hyperlipidemia 01/21/2022   Lumbar stenosis 01/21/2022   Stroke (cerebrum) ([place]) 01/21/2022   S/P lumbar spinal fusion 11/15/2014   Lumbar discitis 11/29/2013   Discitis 11/24/2013   Diarrhea 11/17/2013   Osteomyelitis of lumbar spine ([place]) 11/16/2013   Hypertension    Asthma    Anxiety     ONSET DATE: 01/28/22  REFERRING DIAG: I63.9 (ICD-10-CM) - Cerebral infarction, unspecified   THERAPY DIAG:  Hemiplegia and hemiparesis following cerebral infarction affecting right dominant side (HCC)  Other lack of coordination  Muscle weakness (generalized)  Other disturbances of skin sensation  Rationale for Evaluation and Treatment Rehabilitation  SUBJECTIVE:   SUBJECTIVE STATEMENT: Pt reports excruciating pain in RUE, particularly at shoulder, when attempting to reach for items and when attempting to reach under the couch. Pt accompanied  by: self and significant other (spouse)  PERTINENT HISTORY: PMH: HTN, cervical radiculopathy, hepatitis, osteomyelitis/discitis, lumbar stenosis with foot drop anxiety, GERD, insomnia  PRECAUTIONS: Fall  WEIGHT BEARING RESTRICTIONS  was awaiting spinal fusion surgery, limit bending, twisting, lifting >10#  PAIN:  Are you having pain? Yes: NPRS scale: 5/10 Pain location: R shoulder  Pain description: aching, sharp, shooting Aggravating factors: certain movements Relieving factors: tylenol, lyrica  FALLS: Has patient fallen in last 6 months? Yes. Number of falls 2  LIVING  ENVIRONMENT: Lives with: lives with their spouse Lives in: House/apartment Stairs: Yes: Internal: full flights of steps; on right going up and External: 7 steps; on left going up Has following equipment at home: Single point cane, Walker - 2 wheeled, Walker - 4 wheeled, shower chair, and bed side commode  PLOF: Independent  PATIENT GOALS I want my arm back.    OBJECTIVE:   HAND DOMINANCE: Right  ADLs: Transfers/ambulation related to ADLs: Mod I with no use of  Eating: Husband assists with cutting, feeding self with L hand Grooming: utilizing L hand with opening and brushing teeth UB Dressing: unable to manage buttons, otherwise Mod I LB Dressing: unable to tie shoes, just slipping on toes Toileting: Mod I with use of L hand Bathing: Mod I, utilizing L hand to shave Tub Shower transfers: Mod I Equipment: Shower seat with back, Walk in shower, and bed side commode   IADLs: Light housekeeping: laundry and hanging up clothes, wipes counters/sink, needs assist to fold clothing Meal Prep: no, did not cook pre-stroke Medication management: husband opens pill bottles Handwriting: unable  MOBILITY STATUS: Needs Assist: Supervision - Mod I without use of AD  POSTURE COMMENTS:  No Significant postural limitations  ACTIVITY TOLERANCE: Activity tolerance: WNL for tasks assessed on eval  FUNCTIONAL OUTCOME MEASURES: FOTO: 49  UPPER EXTREMITY ROM     Active ROM Right eval Left eval  Shoulder flexion WNL WNL  Shoulder abduction    Shoulder adduction    Shoulder extension    Shoulder internal rotation    Shoulder external rotation    Elbow flexion    Elbow extension -5 WNL  Wrist flexion 38 WNL  Wrist extension 50 WNL  Wrist ulnar deviation    Wrist radial deviation    Wrist pronation    Wrist supination    (Blank rows = not tested)   UPPER EXTREMITY MMT:     MMT Right eval Left eval  Shoulder flexion 4+/5   Shoulder abduction    Shoulder adduction    Shoulder  extension    Shoulder internal rotation    Shoulder external rotation    Middle trapezius    Lower trapezius    Elbow flexion 4+/5   Elbow extension 4+/5   Wrist flexion    Wrist extension    Wrist ulnar deviation    Wrist radial deviation    Wrist pronation    Wrist supination    (Blank rows = not tested)  HAND FUNCTION: Loose gross grasp, unable to squeeze dynamometer  COORDINATION: Box and Blocks:  Right 10 blocks, Left 53 blocks Modified performance with RUE with therapist holding blocks in palm and pt able to complete gross grasp to pick up one at a time from OT palm of hand, minimal hand opening/closing  SENSATION: Light touch: WFL Stereognosis: Impaired  Hot/Cold: Impaired  Proprioception: Impaired   COGNITION: Overall cognitive status: Within functional limits for tasks assessed  VISION: Subjective report: Pt reports that her R eye  does not close as well and that she feels that her L eye is compensating for her R eye. Baseline vision: Wears glasses all the time  VISION ASSESSMENT: To be further assessed in functional context  --------------------------------------------------------------------------------------------------------------------------------------------------------------------- (objective measures above completed at initial evaluation unless otherwise dated)  TODAY'S TREATMENT:  03/28/22 Pt reporting increased pain in RUE, especially in shoulder.  Discussed typical movements that seem to exacerbate pain.  Reviewed positioning and would benefit from education on modifications to sleep positioning as well to attempt to minimize onset of pain. Supine exercises: engaged in snow angel slides along mat, "open book" chest stretch, and dowel activities with flexion to 90*and chest press.  OT providing min cues for demonstration, technique, and quality of movement.  Pt with good carryover with repetition, completing each 2x10.  Utilized dowel for symmetry with  chest press and flexion to 90* and min cues to keep movement in pain free range.   03/24/22 Educated on spasticity and provided with handout from [place] [employer].  Discussed typical movements and positioning to decrease impact of spasticity.   Tendon glides: OT providing demonstration and min verbal cues for gentle strengthening and ROM exercises with nerve glides.  Pt demonstrating good positioning and reports decrease in pain with repetition.  Encouraged completion of exercises 2-3x/day to engage in frequent movement and repositioning.   03/20/22 Functional reach/grasp: engaged in picking small cones with RUE and stacking with focus on functional reach and sustained grasp.  Pt demonstrating mild shoulder hike during reach and decreased sustained grasp when attempting to remove and unstack cones due to requiring increased grasp.  Engaged in rotation of cones with focus on in-hand manipulation and internal/external rotation.  Pt unable to rotate cone in finger tips, ultimately requiring lateral lean, shoulder hike, and internal rotation to flip cone.   Mirror feedback: IT trainer for visual feedback for shoulder hike.  OT providing demonstration and verbal cues for improved reaching technique with use of mirror for additional feedback as needed.  Pt with improved mobility and decreased shoulder hike with stacking and rotation of cones.  Reaching across midline to stack cones to facilitate increased reach while maintaining good posture. Therapeutic exercise: shoulder shrugs and scapular retraction with mirror for visual feedback to facilitate improved symmetry with min cues fading to supervision with good carryover.   Towel glides: OT instructed in towel glides with UE in neutral and cues for movement pattern to decrease shoulder hike. Pt with improvements with technique with repetition.      PATIENT EDUCATION: Education details: Spasticity onset, common effects, and treatment  strategies, nerve glides, supine therex Person educated: Patient and friend Education method: Explanation Education comprehension: verbalized understanding and needs further education   HOME EXERCISE PROGRAM: Fine motor coordination handout  Access Code: [email or web address] URL: [email or web address] Date: 03/20/2022 Prepared by: [facility]  Exercises - Seated Shoulder Shrug  - 1 sets - 10 reps - Seated Scapular Retraction  - 1 sets - 10 reps - Seated Shoulder Rolls  - 1 sets - 10 reps - Seated Shoulder Flexion Towel Slide at Table Top Full Range of Motion  - 1 sets - 10 reps  Access Code: [email or web address] URL: [email or web address] Date: 03/28/2022 Prepared by: [facility]  Exercises - Standing Median Nerve Glide  - 2-3 x daily - 1 sets - 10 reps - Standing Ulnar Nerve Glide  - 2-3 x daily - 1 sets - 10 reps -  Standing Radial Nerve Glide  - 2-3 x daily - 1 sets - 10 reps - Median Nerve Glide  - 2-3 x daily - 1 sets - 10 reps - Seated Digit Tendon Gliding  - 2-3 x daily - 1 sets - 10 reps - Open Book Chest Stretch on Towel Roll  - 1 x daily - 2 sets - 10 reps - Snow Angels  - 1 x daily - 2 sets - 10 reps - Supine Shoulder Press with Dowel  - 1 x daily - 2 sets - 10 reps - Supine Shoulder Flexion with Dowel to 90  - 1 x daily - 2 sets - 10 reps    GOALS: Goals reviewed with patient? Yes  SHORT TERM GOALS: Target date: 03/28/22  Pt will be independent with HEP for fine and gross motor control. Baseline: Goal status: MET - 03/28/22  2.  Pt will demonstrate improved UE functional use for ADLs as evidenced by increasing functional use of RUE to pick up blocks to complete Box and Blocks assessment in standardized manner. Baseline:  Goal status: IN PROGRESS  3.  Pt will verbalize understanding with safety considerations and AE needs to ensure safety d/t decreased sensation. Baseline:  Goal  status: MET - 03/28/22   LONG TERM GOALS: Target date: 04/25/22  Pt and spouse will verbalize understanding for return to driving recommendations and pursue driving eval PRN. Baseline:  Goal status: IN PROGRESS  2.  Pt will demonstrate improved UE functional use for ADLs as evidenced by increasing box/ blocks score by 10 blocks with RUE Baseline: unable to move any from box, however able to move 10 with gross grasp from blocks placed in OT palm Goal status: IN PROGRESS  3.  Pt will demonstrate increased functional reach and grasp to obtain light weight item from moderate height shelf. Baseline:  Goal status: IN PROGRESS  4.  Pt will demonstrate increased hand function and grasp to open containers (jars, medications) and verbalize understanding of AE PRN to increase ease, safety, and independence with opening containers. Baseline:  Goal status: IN PROGRESS  5.  Pt will demonstrate use of BUE together to engage in functional IADL tasks (simple meal prep, folding laundry, etc). Baseline:  Goal status: IN PROGRESS   ASSESSMENT:  CLINICAL IMPRESSION: Treatment session with focus on supine therapeutic exercises with focus on movement through pain free range, reiterating education on spasticity effects and treatment. Pt responding well to supine there ex, even to progress towards shoulder flexion to 90* and chest press, with focus on reducing pain with movement and simulating reach.  PERFORMANCE DEFICITS in functional skills including ADLs, IADLs, coordination, dexterity, sensation, ROM, strength, FMC, GMC, balance, body mechanics, decreased knowledge of use of DME, and UE functional use and psychosocial skills including environmental adaptation, habits, and routines and behaviors.   IMPAIRMENTS are limiting patient from ADLs, IADLs, and work.   COMORBIDITIES may have co-morbidities  that affects occupational performance. Patient will benefit from skilled OT to address above impairments and  improve overall function.  MODIFICATION OR ASSISTANCE TO COMPLETE EVALUATION: Min-Moderate modification of tasks or assist with assess necessary to complete an evaluation.  OT OCCUPATIONAL PROFILE AND HISTORY: Detailed assessment: Review of records and additional review of physical, cognitive, psychosocial history related to current functional performance.  CLINICAL DECISION MAKING: Moderate - several treatment options, min-mod task modification necessary  REHAB POTENTIAL: Good  EVALUATION COMPLEXITY: Moderate    PLAN: OT FREQUENCY: 2x/week  OT DURATION: 8 weeks  PLANNED  INTERVENTIONS: self care/ADL training, therapeutic exercise, therapeutic activity, neuromuscular re-education, manual therapy, passive range of motion, balance training, functional mobility training, splinting, electrical stimulation, ultrasound, compression bandaging, moist heat, cryotherapy, patient/family education, psychosocial skills training, energy conservation, coping strategies training, and DME and/or AE instructions  RECOMMENDED OTHER SERVICES: N/A  CONSULTED AND AGREED WITH PLAN OF CARE: Patient and family member/caregiver  PLAN FOR NEXT SESSION: Review and begin with supine there ex and nerve glides as pain in RUE persists, Complete Box and Blocks at next session.   engage in wrist ROM and hand opening/closing and attempt finger isolation exercises/activities. Gross grasp and reach activities.  Progress to increased strengthening as able.   Simonne Come, OTR/L 03/28/2022, 12:17 PM

## 2022-03-29 NOTE — Progress Notes (Signed)
Subjective:    Patient ID: Chloe Johnson, female    DOB: 06-Jan-1956, 66 y.o.   MRN: [id number]  HPI:  An audio/video tele-health visit is felt to be the most appropriate encounter for this patient at this time. This is a follow up tele-visit via phone. The patient is at home. MD is at office. Prior to scheduling this appointment, our staff discussed the limitations of evaluation and management by telemedicine and the availability of in-person appointments. The patient expressed understanding and agreed to proceed.   Chloe Johnson is a 66 y.o. female who is here for HFU appointment for follow up of her Left Middle Cerebral Artery Stroke, Left Ventricular Thrombus and Essential Hypertension.   Dr. Iver Nestle H&P on 01/28/2022 HPI: Chloe Johnson is a 66 y.o. 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 Amada Jupiter, with key details confirmed by husband Chloe Johnson  is a 47 y.o.  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 She presents today after the following series events.  Around 12 PM this past Saturday, the patient noted sudden onset dizziness described as "vertigo" followed by nausea then followed by an episode of vomiting.  She next felt a sudden onset sharp chest pain with radiation to her back.  She assumed it was her cervical radiculopathy "acting up" because shortly after this event, she was holding 2 plates in her hand to feed her  cats when she suddenly had no control of both upper extremities and dropped said plates.  On Sunday, she felt generally weak but then improved from all of this until this morning, around 9 AM, when she experienced word finding difficulties.  Her husband, who is at bedside, also notes she had a right facial droop transiently that lasted no more than a few minutes.  She may have had 2 subsequent episodes, as documented by ED physician; however, all of her is told by the patient's husband that this was 1 single episode."   Her hospital course was noted for acute worsening for which she underwent thrombectomy and was discharged with only some very mild residual aphasia not detectable on examination but noticed by the patient per husband.  He notes that she has been in her normal state of health since discharge other than a visit to the ED on 9/3 with left shoulder pain which was attributed to her known cervical stenosis for which she is planned for surgery in the near future   Husband reports that she was taking his blood pressure when suddenly the blood pressure monitor fell off the table.  She tried to catch it but lost her balance had weakness in both of her legs.  Initially she got to both both arms but then became weak on the right side.  She was unable to speak at all suddenly during this event.  Work-up revealed a left ventricle apical thrombus.   Has been reports she has not missed any of her doses of medication including Eliquis   CTA was attempted to confirm recurrent acute occlusion, however due to IV infiltration and severity  of her symptoms decision was made to proceed directly to IR which was discussed both with [name] as well as with the patient's husband   MR Brain:  IMPRESSION: 1. 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 2. No intracranial large vessel occlusion or significant stenosis. In particular, left MCA branches appear perfused and  without focal stenosis.  MRA:  IMPRESSION: 1. 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 2. No intracranial large vessel occlusion or significant stenosis. In particular, left MCA branches appear perfused and without focal stenosis.  [name] COURSE Stroke:  left MCA punctate scattered infarct with left M2 occlusion s/p IR with TICI2c, embolic secondary to LV thrombus recently on Eliquis CT no acute abnormality IR left superior M2 occlusion, inferior M3/M4 nonocclusive thrombus Status post EVT with TICI2c MRI left insular cortex and frontal lobe punctate infarcts MRA unremarkable 2D Echo EF 45 to 50% on 8/29, LV regional motion abnormality with LV thrombus LDL 150 on 8/29 HgbA1c 5 0 on 8/29 UDS positive for THC Hypercoagulable and autoimmune work up negative Heparin IV for VTE prophylaxis aspirin 81 mg daily and Eliquis (apixaban) daily prior to admission,  now on coumadin with lovenox bridge. INR goal 2.5 Continue ASA 81 per cardiology. Ongoing aggressive stroke risk factor management Therapy recommendations:  CIR Disposition:  pending   Hx of stroke and LV thrombus 8/29 admitted for nausea vomiting dizziness chest pain and then developed right facial droop and aphasia.  Found to have troponin > 300, EF 45 to 50% with LV thrombus.  CTA coronary artery showed minimal CAD.  CT no acute abnormality.  CTA head and neck showed left M2 occlusion.  CTP 0/7.  Status post EVT with TICI2c.  MRI showed left frontal [place] matter punctate infarct.  Repeat MRI showed left external capsule, insular cortex and frontal cortex punctate infarcts.  LDL 150, A1c 5.0.  UDS positive for THC.  Patient was put on heparin IV and aspirin and eventually discharged on Eliquis and aspirin as well as Crestor 20. Per family, patient has slight expressive aphasia since discharge, no physical deficit. Cardiology [name] on board, recommend coumadin with INR goal  2.5. on lovenox bridge.    [name] was admitted to inpatient rehabilitation on 02/04/2022 and discharged home on 02/07/2022. She will begin outpatient Therapy on 02/25/2022 at [facility].  Ms. Carbine has expressive aphasia.  She was positive with THC, she reports she uses CBD products for anxiety, this was discussed with [name]. [name] stated she can continue with her CBD product, she verbalizes understanding.   1) Right shoulder pain  -started suddenly when she was doing an activity (history is limited by her aphasia) -she feels this pain is more than just spasticity -it is very severe -she asks whether she should increase her Lyrica 200mg  BIS -XR discussed and she would like to get this  Pain Inventory Average Pain 4 Pain Right Now 4 My pain is constant and aching  LOCATION OF PAIN  Neck,Wrist,Hand,Back  BOWEL Number of stools per week: 14  Type of laxative Super Cleanse  BLADDER Normal    Mobility walk without assistance how many minutes can you walk? As needed ability to climb steps?  yes do you drive?  no Do you have any goals in this area?  yes  Function employed # of hrs/week 70  Neuro/Psych bowel control problems weakness numbness anxiety  Prior Studies  Any changes since last visit?  no  Physicians involved in your care Any changes since last visit?  no   Family History  Problem Relation Age of Onset   Other Mother    Other Father    Hypertension Father    Social History   Socioeconomic History   Marital status: Married    Spouse name: Juanda Crumble   Number of children: 3   Years of education: RN   Highest education level: Not on file  Occupational History    Employer: [employer]   Tobacco Use   Smoking status: Never   Smokeless tobacco: Never  Vaping Use   Vaping Use: Never used  Substance and Sexual Activity   Alcohol use: Yes    Alcohol/week: 6.0 standard drinks of alcohol    Types: 3  Glasses of wine, 3 Shots of liquor per week   Drug use: No   Sexual activity: Yes    Birth control/protection: Post-menopausal  Other Topics Concern   Not on file  Social History Narrative   Patient is married and lives at home with her husband Juanda Crumble).  Patient works full time as a Therapist, sports.   Right handed.   College education.   Social Determinants of Health   Financial Resource Strain: Not on file  Food Insecurity: Not on file  Transportation Needs: Not on file  Physical Activity: Not on file  Stress: Not on file  Social Connections: Not on file   Past Surgical History:  Procedure Laterality Date   BREAST CYST EXCISION Left 2010   "polypectomy"   IR CT HEAD LTD  01/21/2022   IR CT HEAD LTD  01/28/2022   IR PERCUTANEOUS ART THROMBECTOMY/INFUSION INTRACRANIAL INC DIAG ANGIO  01/21/2022   IR PERCUTANEOUS ART THROMBECTOMY/INFUSION INTRACRANIAL INC DIAG ANGIO  01/28/2022   IR US GUIDE VASC ACCESS RIGHT  01/22/2022   PICC LINE PLACE PERIPHERAL ([place] HX) Right    for use of Levaquin & Vancomycin, of note: she reports that she had a "flulike feeling"    RADIOLOGY WITH ANESTHESIA N/A 01/21/2022   Procedure: IR WITH ANESTHESIA;  Surgeon: Luanne Bras, MD;  Location: [facility];  Service: Radiology;  Laterality: N/A;   RADIOLOGY WITH ANESTHESIA N/A 01/28/2022   Procedure: IR WITH ANESTHESIA;  Surgeon: Radiologist, Medication, MD;  Location: [facility];  Service: Radiology;  Laterality: N/A;   TONSILLECTOMY AND ADENOIDECTOMY  1975   TUBAL LIGATION  1999   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/16/2013   L3-4/notes 11/24/2013, arms, neck   Exertional asthma    GERD (gastroesophageal reflux disease)    Hypertension    Kidney stones    "have always passed them"   Neck pain    Osteomyelitis ([place]) 11/23/2013   osteomyelitis, discitis    Sleep concern    uses Prozac for sleep    There were no vitals taken for this visit.  Opioid Risk Score:    Fall Risk Score:  `1  Depression screen [facility] 2/9     02/20/2022    9:22 AM 02/15/2014   11:06 AM 01/18/2014    2:15 PM 12/23/2013   10:14 AM  Depression screen PHQ 2/9  Decreased Interest 0 0 0 0  Down, Depressed, Hopeless 0 1 0 0  PHQ - 2 Score 0 1 0 0  Altered sleeping 0     Tired, decreased energy 1  Change in appetite 0     Feeling bad or failure about yourself  0     Trouble concentrating 0     Moving slowly or fidgety/restless 0     Suicidal thoughts 0     PHQ-9 Score 1     Difficult doing work/chores Not difficult at all         Review of Systems  Musculoskeletal:  Positive for back pain and neck pain.       Wrist and hand pain   All other systems reviewed and are negative.      Objective:   Not performed       Assessment & Plan:  Left Middle Cerebral Artery Stroke: Continue current medication regimen. Has a scheduled appointment with Neurology. She will begin outpatient therapy at [facility]. Continue to Monitor.  Left Ventricular Thrombus: Continue current medication regimen. Cardiology Following.  Essential Hypertension. Continue current medication regimen. PCP Following. Continue to Monitor.  Right shoulder pain: XR ordered Lyrica increased to 200mg  BID  6 minutes spent in discussion of her right shoulder pain, recommending that we order XR, increase Lyrica

## 2022-03-31 ENCOUNTER — Telehealth: Payer: Self-pay | Admitting: Cardiovascular Disease

## 2022-03-31 ENCOUNTER — Ambulatory Visit: Payer: Federal, State, Local not specified - PPO | Admitting: Neurology

## 2022-03-31 ENCOUNTER — Ambulatory Visit
Admission: RE | Admit: 2022-03-31 | Discharge: 2022-03-31 | Disposition: A | Discharge status: Home / Self Care | Payer: Federal, State, Local not specified - PPO | Source: Ambulatory Visit | Attending: Physical Medicine and Rehabilitation | Admitting: Physical Medicine and Rehabilitation

## 2022-03-31 ENCOUNTER — Encounter: Payer: Self-pay | Admitting: Neurology

## 2022-03-31 VITALS — BP 130/78 | HR 61 | Ht 60.0 in | Wt 140.0 lb

## 2022-03-31 DIAGNOSIS — M25511 Pain in right shoulder: Secondary | ICD-10-CM | POA: Diagnosis not present

## 2022-03-31 DIAGNOSIS — I63132 Cerebral infarction due to embolism of left carotid artery: Secondary | ICD-10-CM | POA: Diagnosis not present

## 2022-03-31 DIAGNOSIS — R0683 Snoring: Secondary | ICD-10-CM | POA: Diagnosis not present

## 2022-03-31 DIAGNOSIS — R4701 Aphasia: Secondary | ICD-10-CM | POA: Insufficient documentation

## 2022-03-31 MED ORDER — METOPROLOL SUCCINATE ER 25 MG PO TB24: 12.5000 mg | ORAL_TABLET | Freq: Every day | ORAL | 3 refills | Status: DC

## 2022-03-31 NOTE — Progress Notes (Signed)
Chief Complaint  Patient presents with   Hospitalization Follow-up    Rm 15. Accompanied by husband, Chloe Johnson. NX Ami Mally LV 2015/internal hospital referral for stroke.      ASSESSMENT AND PLAN  Chloe Johnson is a 66 y.o. 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, heart attack  First stroke on January 22, 2019 with full recovery after mechanical endovascular thrombectomy,  Recovering second stroke while taking Eliquis on January 28, 2022, now on Coumadin,  Repeat MRI of the brain,  At risk for obstructive sleep apnea  She has small oropharyngeal space, snoring, excessive daytime sleepiness fatigue,  Referred for sleep study    Return To Clinic With NP In 4 Months  DIAGNOSTIC DATA (LABS, IMAGING, TESTING) - I reviewed patient records, labs, notes, testing and imaging myself where available.  INR 2.7 on Nov 1, normal B12, TSH, ANA, hypercoagulable status,  BMP   MEDICAL HISTORY:  Chloe Johnson is a 66 year old right-handed female, accompanied by her husband, seen in request by her primary care [facility] physician Dr. Margaretann Loveless, Dorris Singh, for evaluation of stroke, initial evaluation March 31, 2022  I reviewed and summarized the referring note. PMHX. HTN HLD Urinary Urgency,  CAD  She works as a Astronomer, work from home, January 16, 2022 she was traveling at a slight monitoring, noticed dizziness, then diarrhea, vomiting, she was still able to push herself to work  January 21, 2022, she had sudden onset right hand weakness, dropped the plate, transient speech difficulty lasting for 30 minutes, husband also noted mild right facial droop, symptoms were transient, she was able to bounce back to baseline  Personally reviewed MRI of the brain without contrast 11:50 AM, small acute infarction at the left frontal [place] matter  Same evening, after she was admitted, around 6 PM, was noted to have word finding difficulty  CT angiogram of head and  neck showed occluded left M2 MCA, perfusion identified critically hypoperfused parenchyma within the left insular/frontal lobe, reported mismatch volume 7 mm  She had a successful mechanical thrombectomy for distal M2 MCA occlusion with direct contact aspiration achieving complete recanalization,  When she wake up from anesthesia, she denies focal symptoms  Repeat MRI of the brain January 22, 2022 showed no punctuated acute infarction in the left external capsule, insula, frontal lobe cortex, and previous noted left frontal lobe acute infarction  Laboratory evaluation normal CBC, INR, negative HIV, A1c, LDL 150, hemoglobin of 12  Significantly elevated troponin up to 341  Echocardiogram January 21, 2022 showed:  regional wall motion abnormality, apical hypokinesis Left ventricular thrombosis, 1.84 x 0.9 x 1.2 cm  She was discharged with Eliquis with no focal deficit,  On January 28, 2022, while she was checking her husband's blood pressure, she suddenly slumped over, developed language difficulty right-sided weakness, was taken to hospital by ambulance  Repeat MRI/A of the brain showed new acute infarction in the left posterior frontal anterior parietal cortex, left MCA branch appear perfused and without focal stenosis Repeat IR procedure, January 28, 2022 endovascular revascularization of occluded division of left MCA achieving a TICI 2C revascularization of the left MCA distribution, she was intubated due to poor responsiveness following reversal of anesthesia  She insists she came around from intubation and anesthesia, she had a persistent deficit, aphasia and word finding difficulties, no comprehensive difficulty, also has right arm more than left weakness, over the past 2 months, she had some progression, not back to baseline yet  More extensive laboratory evaluation UDS was positive for marijuana, hypercoagulable status was negative, negative ANA, RPR, normal B12, TSH, was treated with  IV heparin now on Coumadin   PHYSICAL EXAM:   Vitals:   03/31/22 1437  BP: 130/78  Pulse: 61  Weight: 140 lb (63.5 kg)  Height: 5' (1.524 m)   Not recorded     Body mass index is 27.34 kg/m.  PHYSICAL EXAMNIATION:  Gen: NAD, conversant, well nourised, well groomed                     Cardiovascular: Regular rate rhythm, no peripheral edema, warm, nontender. Eyes: Conjunctivae clear without exudates or hemorrhage Neck: Supple, no carotid bruits. Pulmonary: Clear to auscultation bilaterally   NEUROLOGICAL EXAM:  MENTAL STATUS: Speech/cognition: Awake, oriented to history taking, normal comprehension, expressive aphasia, word finding difficulties, paraphasic errors, mild dysarthria   CRANIAL NERVES: CN II: Visual fields are full to confrontation. Pupils are round equal and briskly reactive to light. CN III, IV, VI: extraocular movement are normal. No ptosis. CN V: Facial sensation is intact to light touch CN VII: Right lower face weakness CN VIII: Hearing is normal to causal conversation. CN IX, X: Phonation is normal. CN XI: Head turning and shoulder shrug are intact CN XII: Narrow long pharyngeal space  MOTOR: Fixation of right arm on rapid rotating movement, mild right leg drift  REFLEXES: Mild hyperreflexia of right upper and lower extremity  SENSORY: Intact to light touch, pinprick and vibratory sensation are intact in fingers and toes.  COORDINATION: There is no trunk or limb dysmetria noted.  GAIT/STANCE: Steady,  REVIEW OF SYSTEMS:  Full 14 system review of systems performed and notable only for as above All other review of systems were negative.   ALLERGIES: Allergies  Allergen Reactions   Doxycycline Nausea Only    malaise    Erythromycin Nausea And Vomiting   Iodine     unknown   Vancomycin    Vicodin [Hydrocodone-Acetaminophen] Other (See Comments)    Restless, tolerate percocet   Dilaudid [Hydromorphone Hcl] Rash    *Pt states she can  take if given benadryl prior*   Keflex [Cephalexin] Rash   Penicillins Rash    Hives   Sulfa Antibiotics Rash    Hives    HOME MEDICATIONS: Current Outpatient Medications  Medication Sig Dispense Refill   acetaminophen (TYLENOL) 325 MG tablet Take 2 tablets (650 mg total) by mouth every 4 (four) hours as needed for mild pain (or temp > 37.5 C (99.5 F)).     cyclobenzaprine (FLEXERIL) 10 MG tablet Take 1 tablet (10 mg total) by mouth at bedtime. 30 tablet 3   famotidine (PEPCID) 20 MG tablet Take 1 tablet (20 mg total) by mouth 2 (two) times daily. 180 tablet 3   ferrous sulfate 325 (65 FE) MG tablet Take 1 tablet (325 mg total) by mouth daily with breakfast. 100 tablet 3   loratadine (CLARITIN) 10 MG tablet Take 1 tablet (10 mg total) by mouth daily.     metoprolol succinate (TOPROL-XL) 25 MG 24 hr tablet Take 0.5 tablets (12.5 mg total) by mouth daily. 30 tablet 0   mirabegron ER (MYRBETRIQ) 50 MG TB24 tablet Take 25 mg by mouth daily.     potassium chloride (KLOR-CON) 10 MEQ tablet Take 2 tablets by mouth twice daily 360 tablet 2   pregabalin (LYRICA) 200 MG capsule Take 1 capsule (200 mg total) by mouth 2 (two) times daily. 180 capsule  3   rosuvastatin (CRESTOR) 20 MG tablet Take 1 tablet (20 mg total) by mouth daily. 90 tablet 3   sacubitril-valsartan (ENTRESTO) 24-26 MG Take 1 tablet by mouth 2 (two) times daily. 180 tablet 3   warfarin (COUMADIN) 4 MG tablet Take 1 tablet (4 mg total) by mouth daily. 30 tablet 11   No current facility-administered medications for this visit.    PAST MEDICAL HISTORY: 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/16/2013   L3-4/notes 11/24/2013, arms, neck   Exertional asthma    GERD (gastroesophageal reflux disease)    Hypertension    Kidney stones    "have always passed them"   Neck pain    Osteomyelitis ([facility]) 11/23/2013   osteomyelitis, discitis    Sleep concern    uses Prozac for sleep      PAST SURGICAL HISTORY: Past Surgical History:  Procedure Laterality Date   BREAST CYST EXCISION Left 2010   "polypectomy"   IR CT HEAD LTD  01/21/2022   IR CT HEAD LTD  01/28/2022   IR PERCUTANEOUS ART THROMBECTOMY/INFUSION INTRACRANIAL INC DIAG ANGIO  01/21/2022   IR PERCUTANEOUS ART THROMBECTOMY/INFUSION INTRACRANIAL INC DIAG ANGIO  01/28/2022   IR US GUIDE VASC ACCESS RIGHT  01/22/2022   PICC LINE PLACE PERIPHERAL ([facility] HX) Right    for use of Levaquin & Vancomycin, of note: she reports that she had a "flulike feeling"    RADIOLOGY WITH ANESTHESIA N/A 01/21/2022   Procedure: IR WITH ANESTHESIA;  Surgeon: Julieanne Cotton, MD;  Location[facility];  Service: Radiology;  Laterality: N/A;   RADIOLOGY WITH ANESTHESIA N/A 01/28/2022   Procedure: IR WITH ANESTHESIA;  Surgeon: Radiologist, Medication, MD;  Location: [facility];  Service: Radiology;  Laterality: N/A;   TONSILLECTOMY AND ADENOIDECTOMY  1975   TUBAL LIGATION  1999    FAMILY HISTORY: Family History  Problem Relation Age of Onset   Other Mother    Other Father    Hypertension Father     SOCIAL HISTORY: Social History   Socioeconomic History   Marital status: Married    Spouse name: Chloe Johnson   Number of children: 3   Years of education: RN   Highest education level: Not on file  Occupational History    Employer: [employer]   Tobacco Use   Smoking status: Never   Smokeless tobacco: Never  Vaping Use   Vaping Use: Never used  Substance and Sexual Activity   Alcohol use: Yes    Alcohol/week: 6.0 standard drinks of alcohol    Types: 3 Glasses of wine, 3 Shots of liquor per week   Drug use: No   Sexual activity: Yes    Birth control/protection: Post-menopausal  Other Topics Concern   Not on file  Social History Narrative   Patient is married and lives at home with her husband Chloe Johnson).  Patient works full time as a Char[employer] fundraiser.   Right handed.   College education.   Social Determinants of Health   Financial Resource  Strain: Not on file  Food Insecurity: Not on file  Transportation Needs: Not on file  Physical Activity: Not on file  Stress: Not on file  Social Connections: Not on file  Intimate Partner Violence: Not on file      Levert Feinstein, M.D. Ph.D.  [facility] [address], [address] [place], [place] [address] Ph: [phone] Fax: [phone]  CC:  Cathlyn Parsons, [place],  [place] [address]  Charlane Ferretti, MD

## 2022-03-31 NOTE — Telephone Encounter (Signed)
Pt c/o medication issue:  1. Name of Medication:  metoprolol succinate (TOPROL-XL) 25 MG 24 hr tablet   2. How are you currently taking this medication (dosage and times per day)? Half tablet daily  3. Are you having a reaction (difficulty breathing--STAT)? no  4. What is your medication issue? Patient's husband states the medication was not refilled and they need to know if she needs to continue. He states if she does need to continue they need it sent [facility].

## 2022-03-31 NOTE — Telephone Encounter (Signed)
Pt last seen 03/10/22 and no change made to medication regimen. Will send refill at this time to [facility] as requested.

## 2022-04-01 ENCOUNTER — Telehealth: Payer: Self-pay | Admitting: Neurology

## 2022-04-01 NOTE — Telephone Encounter (Signed)
medicare/BCBS federal NPR sent to [facility] [phone] 

## 2022-04-02 ENCOUNTER — Ambulatory Visit: Payer: Federal, State, Local not specified - PPO | Admitting: Occupational Therapy

## 2022-04-02 ENCOUNTER — Ambulatory Visit: Payer: Federal, State, Local not specified - PPO

## 2022-04-02 DIAGNOSIS — R278 Other lack of coordination: Secondary | ICD-10-CM | POA: Diagnosis not present

## 2022-04-02 DIAGNOSIS — M6281 Muscle weakness (generalized): Secondary | ICD-10-CM | POA: Diagnosis not present

## 2022-04-02 DIAGNOSIS — R4701 Aphasia: Secondary | ICD-10-CM

## 2022-04-02 DIAGNOSIS — R471 Dysarthria and anarthria: Secondary | ICD-10-CM | POA: Diagnosis not present

## 2022-04-02 DIAGNOSIS — R208 Other disturbances of skin sensation: Secondary | ICD-10-CM

## 2022-04-02 DIAGNOSIS — R482 Apraxia: Secondary | ICD-10-CM

## 2022-04-02 DIAGNOSIS — I69351 Hemiplegia and hemiparesis following cerebral infarction affecting right dominant side: Secondary | ICD-10-CM
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
## 2022-04-02 NOTE — Therapy (Signed)
OUTPATIENT OCCUPATIONAL THERAPY  Treatment Note  Patient Name: Chloe Johnson MRN: [id number] DOB:12/27/1955, 66 y.o., female Today's Date: 04/02/2022  PCP: Charlane Ferretti, MD REFERRING PROVIDER: Izora Ribas, MD    OT End of Session - 04/02/22 1449     Visit Number 7    Number of Visits 17    Date for OT Re-Evaluation 04/25/22    Authorization Type [employer]    OT Start Time 1448    OT Stop Time 1530    OT Time Calculation (min) 42 min                   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/16/2013   L3-4/notes 11/24/2013, arms, neck   Exertional asthma    GERD (gastroesophageal reflux disease)    Hypertension    Kidney stones    "have always passed them"   Neck pain    Osteomyelitis ([place]) 11/23/2013   osteomyelitis, discitis    Sleep concern    uses Prozac for sleep    Past Surgical History:  Procedure Laterality Date   BREAST CYST EXCISION Left 2010   "polypectomy"   IR CT HEAD LTD  01/21/2022   IR CT HEAD LTD  01/28/2022   IR PERCUTANEOUS ART THROMBECTOMY/INFUSION INTRACRANIAL INC DIAG ANGIO  01/21/2022   IR PERCUTANEOUS ART THROMBECTOMY/INFUSION INTRACRANIAL INC DIAG ANGIO  01/28/2022   IR US GUIDE VASC ACCESS RIGHT  01/22/2022   PICC LINE PLACE PERIPHERAL ([place] [facility]) Right    for use of Levaquin & Vancomycin, of note: she reports that she had a "flulike feeling"    RADIOLOGY WITH ANESTHESIA N/A 01/21/2022   Procedure: IR WITH ANESTHESIA;  Surgeon: Luanne Bras, MD;  Location: [facility];  Service: Radiology;  Laterality: N/A;   RADIOLOGY WITH ANESTHESIA N/A 01/28/2022   Procedure: IR WITH ANESTHESIA;  Surgeon: Radiologist, Medication, MD;  Location: [facility];  Service: Radiology;  Laterality: N/A;   [place]   Patient Active Problem List   Diagnosis Date Noted   Aphasia 03/31/2022   Snores 03/31/2022   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   UTI (urinary  tract infection) 02/04/2022   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   Stroke determined by clinical assessment ([place]) 01/28/2022   Middle cerebral artery embolism, left 01/28/2022   H/O ischemic left MCA stroke    Acute respiratory failure (HCC)    CVA (cerebral vascular accident) ([place]) 01/21/2022   NSTEMI (non-ST elevated myocardial infarction) ([place]) 01/21/2022   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   HFrEF (heart failure with reduced ejection fraction) ([place]) 01/21/2022   Hyperlipidemia 01/21/2022   Lumbar stenosis 01/21/2022   Stroke (cerebrum) ([place]) 01/21/2022   S/P lumbar spinal fusion 11/15/2014   Lumbar discitis 11/29/2013   Discitis 11/24/2013   Diarrhea 11/17/2013   Osteomyelitis of lumbar spine ([place]) 11/16/2013   Hypertension    Asthma    Anxiety     ONSET DATE: 01/28/22  REFERRING DIAG: I63.9 (ICD-10-CM) - Cerebral infarction, unspecified   THERAPY DIAG:  Hemiplegia and hemiparesis following cerebral infarction affecting right dominant side (HCC)  Other lack of coordination  Muscle weakness (generalized)  Other disturbances of skin sensation  Rationale for Evaluation and Treatment Rehabilitation  SUBJECTIVE:   SUBJECTIVE STATEMENT: Pt reports that she is waiting for results from imaging on her shoulder on Monday. Pt  accompanied by: self and significant other (spouse)  PERTINENT HISTORY: PMH: HTN, cervical radiculopathy, hepatitis, osteomyelitis/discitis, lumbar stenosis with foot drop anxiety, GERD, insomnia  PRECAUTIONS: Fall  WEIGHT BEARING RESTRICTIONS  was awaiting spinal fusion surgery, limit bending, twisting, lifting >10#  PAIN:  Are you having pain? Yes: NPRS scale: 5/10 Pain location: R shoulder  Pain description: aching, sharp, shooting Aggravating factors: certain movements Relieving factors: tylenol, lyrica  FALLS: Has patient fallen in last 6 months? Yes. Number of falls 2  LIVING ENVIRONMENT: Lives with: lives  with their spouse Lives in: House/apartment Stairs: Yes: Internal: full flights of steps; on right going up and External: 7 steps; on left going up Has following equipment at home: Single point cane, Walker - 2 wheeled, Walker - 4 wheeled, shower chair, and bed side commode  PLOF: Independent  PATIENT GOALS I want my arm back.    OBJECTIVE:   HAND DOMINANCE: Right  ADLs: Transfers/ambulation related to ADLs: Mod I with no use of  Eating: Husband assists with cutting, feeding self with L hand Grooming: utilizing L hand with opening and brushing teeth UB Dressing: unable to manage buttons, otherwise Mod I LB Dressing: unable to tie shoes, just slipping on toes Toileting: Mod I with use of L hand Bathing: Mod I, utilizing L hand to shave Tub Shower transfers: Mod I Equipment: Shower seat with back, Walk in shower, and bed side commode   IADLs: Light housekeeping: laundry and hanging up clothes, wipes counters/sink, needs assist to fold clothing Meal Prep: no, did not cook pre-stroke Medication management: husband opens pill bottles Handwriting: unable  MOBILITY STATUS: Needs Assist: Supervision - Mod I without use of AD  POSTURE COMMENTS:  No Significant postural limitations  ACTIVITY TOLERANCE: Activity tolerance: WNL for tasks assessed on eval  FUNCTIONAL OUTCOME MEASURES: FOTO: 49  UPPER EXTREMITY ROM     Active ROM Right eval Left eval  Shoulder flexion WNL WNL  Shoulder abduction    Shoulder adduction    Shoulder extension    Shoulder internal rotation    Shoulder external rotation    Elbow flexion    Elbow extension -5 WNL  Wrist flexion 38 WNL  Wrist extension 50 WNL  Wrist ulnar deviation    Wrist radial deviation    Wrist pronation    Wrist supination    (Blank rows = not tested)   UPPER EXTREMITY MMT:     MMT Right eval Left eval  Shoulder flexion 4+/5   Shoulder abduction    Shoulder adduction    Shoulder extension    Shoulder internal  rotation    Shoulder external rotation    Middle trapezius    Lower trapezius    Elbow flexion 4+/5   Elbow extension 4+/5   Wrist flexion    Wrist extension    Wrist ulnar deviation    Wrist radial deviation    Wrist pronation    Wrist supination    (Blank rows = not tested)  HAND FUNCTION: Loose gross grasp, unable to squeeze dynamometer  COORDINATION: Box and Blocks:  Right 10 blocks, Left 53 blocks Modified performance with RUE with therapist holding blocks in palm and pt able to complete gross grasp to pick up one at a time from OT palm of hand, minimal hand opening/closing 04/02/22: Right: 16 blocks  SENSATION: Light touch: WFL Stereognosis: Impaired  Hot/Cold: Impaired  Proprioception: Impaired   COGNITION: Overall cognitive status: Within functional limits for tasks assessed  VISION: Subjective report: Pt  reports that her R eye does not close as well and that she feels that her L eye is compensating for her R eye. Baseline vision: Wears glasses all the time  VISION ASSESSMENT: To be further assessed in functional context  --------------------------------------------------------------------------------------------------------------------------------------------------------------------- (objective measures above completed at initial evaluation unless otherwise dated)  TODAY'S TREATMENT:  04/02/22 Box and Blocks: 16 with Right in standardized manner. Pt demonstrating improved pincer grasp. Towel slides: shoulder flexion and flexion at 45* angle on table top with focus on gentle, full ROM in pain free range. Tendon glides: utilized mirror for visual feedback and pt with decreased sensation and proprioception in UE.  OT providing intermittent tactile cues for improved technique, especially with wrist flexion/extension with medial nerve glide with neck flexion.  Pt reports some pain with ulnar nerve glide during movement but not at end range.   Shoulder flexion/gross  grasp: engaged in reaching at 45* angle to pick up cones and stack, then move cones across midline.  Pt demonstrating improved gross grasp and shoulder flexion to allow pt to maintain grasp while placing on vertical stack.  No increased pain with reaching outsdie BOS or across midline this session.    03/28/22 Pt reporting increased pain in RUE, especially in shoulder.  Discussed typical movements that seem to exacerbate pain.  Reviewed positioning and would benefit from education on modifications to sleep positioning as well to attempt to minimize onset of pain. Supine exercises: engaged in snow angel slides along mat, "open book" chest stretch, and dowel activities with flexion to 90*and chest press.  OT providing min cues for demonstration, technique, and quality of movement.  Pt with good carryover with repetition, completing each 2x10.  Utilized dowel for symmetry with chest press and flexion to 90* and min cues to keep movement in pain free range.   03/24/22 Educated on spasticity and provided with handout from [place] [facility] [employer].  Discussed typical movements and positioning to decrease impact of spasticity.   Tendon glides: OT providing demonstration and min verbal cues for gentle strengthening and ROM exercises with nerve glides.  Pt demonstrating good positioning and reports decrease in pain with repetition.  Encouraged completion of exercises 2-3x/day to engage in frequent movement and repositioning.     PATIENT EDUCATION: Education details: Spasticity onset, common effects, and treatment strategies, nerve glides, functional reach and grasp Person educated: Patient Education method: Explanation Education comprehension: verbalized understanding and needs further education   HOME EXERCISE PROGRAM: Fine motor coordination handout  Access Code: [email or web address] URL: [email or web address] Date: 03/20/2022 Prepared by: [facility]  Exercises - Seated Shoulder Shrug  - 1 sets - 10 reps - Seated Scapular Retraction  - 1 sets - 10 reps - Seated Shoulder Rolls  - 1 sets - 10 reps - Seated Shoulder Flexion Towel Slide at Table Top Full Range of Motion  - 1 sets - 10 reps  Access Code: [email or web address] URL: [email or web address] Date: 03/28/2022 Prepared by: [facility]  Exercises - Standing Median Nerve Glide  - 2-3 x daily - 1 sets - 10 reps - Standing Ulnar Nerve Glide  - 2-3 x daily - 1 sets - 10 reps - Standing Radial Nerve Glide  - 2-3 x daily - 1 sets - 10 reps - Median Nerve Glide  - 2-3 x daily - 1 sets - 10 reps - Seated Digit Tendon Gliding  - 2-3 x daily - 1 sets - 10  reps - Open Book Chest Stretch on Towel Roll  - 1 x daily - 2 sets - 10 reps - Snow Angels  - 1 x daily - 2 sets - 10 reps - Supine Shoulder Press with Dowel  - 1 x daily - 2 sets - 10 reps - Supine Shoulder Flexion with Dowel to 90  - 1 x daily - 2 sets - 10 reps    GOALS: Goals reviewed with patient? Yes  SHORT TERM GOALS: Target date: 03/28/22  Pt will be independent with HEP for fine and gross motor control. Baseline: Goal status: MET - 03/28/22  2.  Pt will demonstrate improved UE functional use for ADLs as evidenced by increasing functional use of RUE to pick up blocks to complete Box and Blocks assessment in standardized manner. Baseline:  Goal status: MET - moved 16 blocks with R on 04/02/22  3.  Pt will verbalize understanding with safety considerations and AE needs to ensure safety d/t decreased sensation. Baseline:  Goal status: MET - 03/28/22   LONG TERM GOALS: Target date: 04/25/22  Pt and spouse will verbalize understanding for return to driving recommendations and pursue driving eval PRN. Baseline:  Goal status: IN PROGRESS  2.  Pt will demonstrate improved UE functional use for ADLs as evidenced by increasing box/ blocks score by 10 blocks with RUE Baseline:  unable to move any from box, however able to move 10 with gross grasp from blocks placed in OT palm Goal status: IN PROGRESS  3.  Pt will demonstrate increased functional reach and grasp to obtain light weight item from moderate height shelf. Baseline:  Goal status: IN PROGRESS  4.  Pt will demonstrate increased hand function and grasp to open containers (jars, medications) and verbalize understanding of AE PRN to increase ease, safety, and independence with opening containers. Baseline:  Goal status: IN PROGRESS  5.  Pt will demonstrate use of BUE together to engage in functional IADL tasks (simple meal prep, folding laundry, etc). Baseline:  Goal status: IN PROGRESS   ASSESSMENT:  CLINICAL IMPRESSION: Treatment session with focus on therapeutic exercises with focus on movement through pain free range, reiterating education on spasticity effects and treatment. Pt responding well to nerve glides with decreased reports of pain during any movements this session.   Pt demonstrating improved functional grasp and precision grasp during Box and Blocks assessment.    PERFORMANCE DEFICITS in functional skills including ADLs, IADLs, coordination, dexterity, sensation, ROM, strength, FMC, GMC, balance, body mechanics, decreased knowledge of use of DME, and UE functional use and psychosocial skills including environmental adaptation, habits, and routines and behaviors.   IMPAIRMENTS are limiting patient from ADLs, IADLs, and work.   COMORBIDITIES may have co-morbidities  that affects occupational performance. Patient will benefit from skilled OT to address above impairments and improve overall function.  MODIFICATION OR ASSISTANCE TO COMPLETE EVALUATION: Min-Moderate modification of tasks or assist with assess necessary to complete an evaluation.  OT OCCUPATIONAL PROFILE AND HISTORY: Detailed assessment: Review of records and additional review of physical, cognitive, psychosocial history related to  current functional performance.  CLINICAL DECISION MAKING: Moderate - several treatment options, min-mod task modification necessary  REHAB POTENTIAL: Good  EVALUATION COMPLEXITY: Moderate    PLAN: OT FREQUENCY: 2x/week  OT DURATION: 8 weeks  PLANNED INTERVENTIONS: self care/ADL training, therapeutic exercise, therapeutic activity, neuromuscular re-education, manual therapy, passive range of motion, balance training, functional mobility training, splinting, electrical stimulation, ultrasound, compression bandaging, moist heat, cryotherapy, patient/family education, psychosocial skills  training, energy conservation, coping strategies training, and DME and/or AE instructions  RECOMMENDED OTHER SERVICES: N/A  CONSULTED AND AGREED WITH PLAN OF CARE: Patient and family member/caregiver  PLAN FOR NEXT SESSION: Review and begin with supine there ex and nerve glides as pain in RUE persists.   engage in wrist ROM and hand opening/closing and attempt finger isolation exercises/activities. Gross grasp and reach activities.  Progress to increased strengthening as able.   Simonne Come, OTR/L 04/02/2022, 2:49 PM

## 2022-04-04 ENCOUNTER — Ambulatory Visit: Payer: Federal, State, Local not specified - PPO | Attending: Cardiovascular Disease

## 2022-04-04 DIAGNOSIS — I513 Intracardiac thrombosis, not elsewhere classified: Secondary | ICD-10-CM

## 2022-04-04 DIAGNOSIS — I639 Cerebral infarction, unspecified: Secondary | ICD-10-CM

## 2022-04-04 DIAGNOSIS — Z5181 Encounter for therapeutic drug level monitoring: Secondary | ICD-10-CM | POA: Diagnosis not present

## 2022-04-04 LAB — POCT INR: INR: 5.2 — AB (ref 2.0–3.0)

## 2022-04-04 NOTE — Patient Instructions (Signed)
HOLD TONIGHT AND SATURDAY then Continue taking warfarin 1 tablet daily except 1.5 tablets on Sundays. Continue green leafy veggie to weekly. Recheck INR in 1 week.  [facility] [phone]

## 2022-04-07 ENCOUNTER — Ambulatory Visit: Payer: Federal, State, Local not specified - PPO | Admitting: Occupational Therapy

## 2022-04-07 ENCOUNTER — Ambulatory Visit: Payer: Federal, State, Local not specified - PPO

## 2022-04-07 ENCOUNTER — Other Ambulatory Visit: Payer: Self-pay | Admitting: Physical Medicine and Rehabilitation

## 2022-04-07 DIAGNOSIS — R4701 Aphasia: Secondary | ICD-10-CM | POA: Diagnosis not present

## 2022-04-07 DIAGNOSIS — M6281 Muscle weakness (generalized): Secondary | ICD-10-CM

## 2022-04-07 DIAGNOSIS — I69351 Hemiplegia and hemiparesis following cerebral infarction affecting right dominant side: Secondary | ICD-10-CM

## 2022-04-07 DIAGNOSIS — R208 Other disturbances of skin sensation: Secondary | ICD-10-CM

## 2022-04-07 DIAGNOSIS — R482 Apraxia: Secondary | ICD-10-CM

## 2022-04-07 DIAGNOSIS — R278 Other lack of coordination: Secondary | ICD-10-CM | POA: Diagnosis not present

## 2022-04-07 DIAGNOSIS — R471 Dysarthria and anarthria: Secondary | ICD-10-CM | POA: Diagnosis not present

## 2022-04-07 DIAGNOSIS — M25511 Pain in right shoulder: Secondary | ICD-10-CM
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
## 2022-04-07 NOTE — Therapy (Signed)
OUTPATIENT OCCUPATIONAL THERAPY  Treatment Note  Patient Name: Chloe Johnson MRN: [id number] DOB:Oct 14, 1955, 66 y.o., female Today's Date: 04/07/2022  PCP: Charlane Ferretti, MD REFERRING PROVIDER: Izora Ribas, MD    OT End of Session - 04/07/22 1414     Visit Number 8    Number of Visits 17    Date for OT Re-Evaluation 04/25/22    Authorization Type [employer]    OT Start Time 1412    OT Stop Time 1450    OT Time Calculation (min) 38 min                   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/16/2013   L3-4/notes 11/24/2013, arms, neck   Exertional asthma    GERD (gastroesophageal reflux disease)    Hypertension    Kidney stones    "have always passed them"   Neck pain    Osteomyelitis ([place]) 11/23/2013   osteomyelitis, discitis    Sleep concern    uses Prozac for sleep    Past Surgical History:  Procedure Laterality Date   BREAST CYST EXCISION Left 2010   "polypectomy"   IR CT HEAD LTD  01/21/2022   IR CT HEAD LTD  01/28/2022   IR PERCUTANEOUS ART THROMBECTOMY/INFUSION INTRACRANIAL INC DIAG ANGIO  01/21/2022   IR PERCUTANEOUS ART THROMBECTOMY/INFUSION INTRACRANIAL INC DIAG ANGIO  01/28/2022   IR US GUIDE VASC ACCESS RIGHT  01/22/2022   PICC LINE PLACE PERIPHERAL ([facility]) Right    for use of Levaquin & Vancomycin, of note: she reports that she had a "flulike feeling"    RADIOLOGY WITH ANESTHESIA N/A 01/21/2022   Procedure: IR WITH ANESTHESIA;  Surgeon: Luanne Bras, MD;  Location: [facility];  Service: Radiology;  Laterality: N/A;   RADIOLOGY WITH ANESTHESIA N/A 01/28/2022   Procedure: IR WITH ANESTHESIA;  Surgeon: Radiologist, Medication, MD;  Location: [facility];  Service: Radiology;  Laterality: N/A;   [place]   Patient Active Problem List   Diagnosis Date Noted   Aphasia 03/31/2022   Snores 03/31/2022   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   UTI  (urinary tract infection) 02/04/2022   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   Stroke determined by clinical assessment ([place]) 01/28/2022   Middle cerebral artery embolism, left 01/28/2022   H/O ischemic left MCA stroke    Acute respiratory failure (HCC)    CVA (cerebral vascular accident) ([place]) 01/21/2022   NSTEMI (non-ST elevated myocardial infarction) ([place]) 01/21/2022   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   HFrEF (heart failure with reduced ejection fraction) ([place]) 01/21/2022   Hyperlipidemia 01/21/2022   Lumbar stenosis 01/21/2022   Stroke (cerebrum) ([place]) 01/21/2022   S/P lumbar spinal fusion 11/15/2014   Lumbar discitis 11/29/2013   Discitis 11/24/2013   Diarrhea 11/17/2013   Osteomyelitis of lumbar spine ([place]) 11/16/2013   Hypertension    Asthma    Anxiety     ONSET DATE: 01/28/22  REFERRING DIAG: I63.9 (ICD-10-CM) - Cerebral infarction, unspecified   THERAPY DIAG:  Hemiplegia and hemiparesis following cerebral infarction affecting right dominant side (HCC)  Other lack of coordination  Muscle weakness (generalized)  Other disturbances of skin sensation  Rationale for Evaluation and Treatment Rehabilitation  SUBJECTIVE:   SUBJECTIVE STATEMENT: Pt reports that she is getting an MRI on Friday. Pt accompanied by: self and significant  other (spouse)  PERTINENT HISTORY: PMH: HTN, cervical radiculopathy, hepatitis, osteomyelitis/discitis, lumbar stenosis with foot drop anxiety, GERD, insomnia  PRECAUTIONS: Fall  WEIGHT BEARING RESTRICTIONS  was awaiting spinal fusion surgery, limit bending, twisting, lifting >10#  PAIN:  Are you having pain? Yes: NPRS scale: 4/10 Pain location: R shoulder  Pain description: aching, sharp, shooting Aggravating factors: certain movements Relieving factors: tylenol, lyrica  FALLS: Has patient fallen in last 6 months? Yes. Number of falls 2  LIVING ENVIRONMENT: Lives with: lives with their spouse Lives  in: House/apartment Stairs: Yes: Internal: full flights of steps; on right going up and External: 7 steps; on left going up Has following equipment at home: Single point cane, Walker - 2 wheeled, Walker - 4 wheeled, shower chair, and bed side commode  PLOF: Independent  PATIENT GOALS I want my arm back.    OBJECTIVE:   HAND DOMINANCE: Right  ADLs: Transfers/ambulation related to ADLs: Mod I with no use of  Eating: Husband assists with cutting, feeding self with L hand Grooming: utilizing L hand with opening and brushing teeth UB Dressing: unable to manage buttons, otherwise Mod I LB Dressing: unable to tie shoes, just slipping on toes Toileting: Mod I with use of L hand Bathing: Mod I, utilizing L hand to shave Tub Shower transfers: Mod I Equipment: Shower seat with back, Walk in shower, and bed side commode   IADLs: Light housekeeping: laundry and hanging up clothes, wipes counters/sink, needs assist to fold clothing Meal Prep: no, did not cook pre-stroke Medication management: husband opens pill bottles Handwriting: unable  MOBILITY STATUS: Needs Assist: Supervision - Mod I without use of AD  POSTURE COMMENTS:  No Significant postural limitations  ACTIVITY TOLERANCE: Activity tolerance: WNL for tasks assessed on eval  FUNCTIONAL OUTCOME MEASURES: FOTO: 49  UPPER EXTREMITY ROM     Active ROM Right eval Left eval  Shoulder flexion WNL WNL  Shoulder abduction    Shoulder adduction    Shoulder extension    Shoulder internal rotation    Shoulder external rotation    Elbow flexion    Elbow extension -5 WNL  Wrist flexion 38 WNL  Wrist extension 50 WNL  Wrist ulnar deviation    Wrist radial deviation    Wrist pronation    Wrist supination    (Blank rows = not tested)   UPPER EXTREMITY MMT:     MMT Right eval Left eval  Shoulder flexion 4+/5   Shoulder abduction    Shoulder adduction    Shoulder extension    Shoulder internal rotation    Shoulder  external rotation    Middle trapezius    Lower trapezius    Elbow flexion 4+/5   Elbow extension 4+/5   Wrist flexion    Wrist extension    Wrist ulnar deviation    Wrist radial deviation    Wrist pronation    Wrist supination    (Blank rows = not tested)  HAND FUNCTION: Loose gross grasp, unable to squeeze dynamometer  COORDINATION: Box and Blocks:  Right 10 blocks, Left 53 blocks Modified performance with RUE with therapist holding blocks in palm and pt able to complete gross grasp to pick up one at a time from OT palm of hand, minimal hand opening/closing 04/02/22: Right: 16 blocks  SENSATION: Light touch: WFL Stereognosis: Impaired  Hot/Cold: Impaired  Proprioception: Impaired   COGNITION: Overall cognitive status: Within functional limits for tasks assessed  VISION: Subjective report: Pt reports that her R eye  does not close as well and that she feels that her L eye is compensating for her R eye. Baseline vision: Wears glasses all the time  VISION ASSESSMENT: To be further assessed in functional context  --------------------------------------------------------------------------------------------------------------------------------------------------------------------- (objective measures above completed at initial evaluation unless otherwise dated)  TODAY'S TREATMENT:  04/07/22 Towel slides: shoulder flexion and horizontal abduction on table top with focus on gentle, full ROM in pain free range. Coordination: large grip peg board pattern replication with RUE.  OT providing intermittent tactile cues at shoulder to decrease shoulder hike and lateral lean.  Pt notes progress with peg board activity, however also notes impact of spasticity on movement.   ***overflow movement   04/02/22 Box and Blocks: 16 with Right in standardized manner. Pt demonstrating improved pincer grasp. Towel slides: shoulder flexion and flexion at 45* angle on table top with focus on gentle,  full ROM in pain free range. Tendon glides: utilized mirror for visual feedback and pt with decreased sensation and proprioception in UE.  OT providing intermittent tactile cues for improved technique, especially with wrist flexion/extension with medial nerve glide with neck flexion.  Pt reports some pain with ulnar nerve glide during movement but not at end range.   Shoulder flexion/gross grasp: engaged in reaching at 45* angle to pick up cones and stack, then move cones across midline.  Pt demonstrating improved gross grasp and shoulder flexion to allow pt to maintain grasp while placing on vertical stack.  No increased pain with reaching outsdie BOS or across midline this session.    03/28/22 Pt reporting increased pain in RUE, especially in shoulder.  Discussed typical movements that seem to exacerbate pain.  Reviewed positioning and would benefit from education on modifications to sleep positioning as well to attempt to minimize onset of pain. Supine exercises: engaged in snow angel slides along mat, "open book" chest stretch, and dowel activities with flexion to 90*and chest press.  OT providing min cues for demonstration, technique, and quality of movement.  Pt with good carryover with repetition, completing each 2x10.  Utilized dowel for symmetry with chest press and flexion to 90* and min cues to keep movement in pain free range.   03/24/22 Educated on spasticity and provided with handout from [facility] [facility] [employer].  Discussed typical movements and positioning to decrease impact of spasticity.   Tendon glides: OT providing demonstration and min verbal cues for gentle strengthening and ROM exercises with nerve glides.  Pt demonstrating good positioning and reports decrease in pain with repetition.  Encouraged completion of exercises 2-3x/day to engage in frequent movement and repositioning.     PATIENT EDUCATION: Education details: Spasticity onset, common effects, and treatment  strategies, nerve glides, functional reach and grasp Person educated: Patient Education method: Explanation Education comprehension: verbalized understanding and needs further education   HOME EXERCISE PROGRAM: Fine motor coordination handout  Access Code: [email or web address] URL: [email or web address] Date: 03/20/2022 Prepared by: [facility]  Exercises - Seated Shoulder Shrug  - 1 sets - 10 reps - Seated Scapular Retraction  - 1 sets - 10 reps - Seated Shoulder Rolls  - 1 sets - 10 reps - Seated Shoulder Flexion Towel Slide at Table Top Full Range of Motion  - 1 sets - 10 reps  Access Code: [email or web address] URL: [email or web address] Date: 03/28/2022 Prepared by: [facility]  Exercises - Standing Median Nerve Glide  - 2-3 x daily - 1 sets - 10 reps -  Standing Ulnar Nerve Glide  - 2-3 x daily - 1 sets - 10 reps - Standing Radial Nerve Glide  - 2-3 x daily - 1 sets - 10 reps - Median Nerve Glide  - 2-3 x daily - 1 sets - 10 reps - Seated Digit Tendon Gliding  - 2-3 x daily - 1 sets - 10 reps - Open Book Chest Stretch on Towel Roll  - 1 x daily - 2 sets - 10 reps - Snow Angels  - 1 x daily - 2 sets - 10 reps - Supine Shoulder Press with Dowel  - 1 x daily - 2 sets - 10 reps - Supine Shoulder Flexion with Dowel to 90  - 1 x daily - 2 sets - 10 reps    GOALS: Goals reviewed with patient? Yes  SHORT TERM GOALS: Target date: 03/28/22  Pt will be independent with HEP for fine and gross motor control. Baseline: Goal status: MET - 03/28/22  2.  Pt will demonstrate improved UE functional use for ADLs as evidenced by increasing functional use of RUE to pick up blocks to complete Box and Blocks assessment in standardized manner. Baseline:  Goal status: MET - moved 16 blocks with R on 04/02/22  3.  Pt will verbalize understanding with safety considerations and AE needs to ensure safety d/t decreased  sensation. Baseline:  Goal status: MET - 03/28/22   LONG TERM GOALS: Target date: 04/25/22  Pt and spouse will verbalize understanding for return to driving recommendations and pursue driving eval PRN. Baseline:  Goal status: IN PROGRESS  2.  Pt will demonstrate improved UE functional use for ADLs as evidenced by increasing box/ blocks score by 10 blocks with RUE Baseline: unable to move any from box, however able to move 10 with gross grasp from blocks placed in OT palm Goal status: IN PROGRESS  3.  Pt will demonstrate increased functional reach and grasp to obtain light weight item from moderate height shelf. Baseline:  Goal status: IN PROGRESS  4.  Pt will demonstrate increased hand function and grasp to open containers (jars, medications) and verbalize understanding of AE PRN to increase ease, safety, and independence with opening containers. Baseline:  Goal status: IN PROGRESS  5.  Pt will demonstrate use of BUE together to engage in functional IADL tasks (simple meal prep, folding laundry, etc). Baseline:  Goal status: IN PROGRESS   ASSESSMENT:  CLINICAL IMPRESSION: Treatment session with focus on therapeutic exercises with focus on movement through pain free range, reiterating education on spasticity effects and treatment. Pt responding well to nerve glides with decreased reports of pain during any movements this session.   Pt demonstrating improved functional grasp and precision grasp during Box and Blocks assessment.    PERFORMANCE DEFICITS in functional skills including ADLs, IADLs, coordination, dexterity, sensation, ROM, strength, FMC, GMC, balance, body mechanics, decreased knowledge of use of DME, and UE functional use and psychosocial skills including environmental adaptation, habits, and routines and behaviors.   IMPAIRMENTS are limiting patient from ADLs, IADLs, and work.   COMORBIDITIES may have co-morbidities  that affects occupational performance. Patient will  benefit from skilled OT to address above impairments and improve overall function.  MODIFICATION OR ASSISTANCE TO COMPLETE EVALUATION: Min-Moderate modification of tasks or assist with assess necessary to complete an evaluation.  OT OCCUPATIONAL PROFILE AND HISTORY: Detailed assessment: Review of records and additional review of physical, cognitive, psychosocial history related to current functional performance.  CLINICAL DECISION MAKING: Moderate - several treatment  options, min-mod task modification necessary  REHAB POTENTIAL: Good  EVALUATION COMPLEXITY: Moderate    PLAN: OT FREQUENCY: 2x/week  OT DURATION: 8 weeks  PLANNED INTERVENTIONS: self care/ADL training, therapeutic exercise, therapeutic activity, neuromuscular re-education, manual therapy, passive range of motion, balance training, functional mobility training, splinting, electrical stimulation, ultrasound, compression bandaging, moist heat, cryotherapy, patient/family education, psychosocial skills training, energy conservation, coping strategies training, and DME and/or AE instructions  RECOMMENDED OTHER SERVICES: N/A  CONSULTED AND AGREED WITH PLAN OF CARE: Patient and family member/caregiver  PLAN FOR NEXT SESSION: Review and begin with supine there ex and nerve glides as pain in RUE persists.   engage in wrist ROM and hand opening/closing and attempt finger isolation exercises/activities. Gross grasp and reach activities.  Progress to increased strengthening as able.   Simonne Come, OTR/L 04/07/2022, 2:14 PM

## 2022-04-08 ENCOUNTER — Telehealth: Payer: Self-pay

## 2022-04-08 ENCOUNTER — Telehealth: Payer: Self-pay | Admitting: Cardiovascular Disease

## 2022-04-08 ENCOUNTER — Ambulatory Visit: Payer: Federal, State, Local not specified - PPO | Attending: Genetic Counselor | Admitting: Genetic Counselor

## 2022-04-08 DIAGNOSIS — I5181 Takotsubo syndrome: Secondary | ICD-10-CM | POA: Diagnosis not present

## 2022-04-08 DIAGNOSIS — I5020 Unspecified systolic (congestive) heart failure: Secondary | ICD-10-CM | POA: Diagnosis not present

## 2022-04-08 DIAGNOSIS — I513 Intracardiac thrombosis, not elsewhere classified: Secondary | ICD-10-CM

## 2022-04-08 DIAGNOSIS — I639 Cerebral infarction, unspecified: Secondary | ICD-10-CM

## 2022-04-08 DIAGNOSIS — Z1379 Encounter for other screening for genetic and chromosomal anomalies: Secondary | ICD-10-CM

## 2022-04-08 NOTE — Telephone Encounter (Signed)
Pt's husband, Ameia Morency (on [place]) Returning call to discuss the paperwork. Would like a call from the nurse

## 2022-04-08 NOTE — Telephone Encounter (Signed)
Mychart message sent to the pt on this paper work.

## 2022-04-08 NOTE — Telephone Encounter (Signed)
Per Chloe Johnson and Dr Elease Hashimoto,  Please order hypercoag panel for this patient

## 2022-04-09 ENCOUNTER — Ambulatory Visit: Payer: Federal, State, Local not specified - PPO

## 2022-04-09 ENCOUNTER — Ambulatory Visit: Payer: Federal, State, Local not specified - PPO | Admitting: Occupational Therapy

## 2022-04-09 DIAGNOSIS — R482 Apraxia: Secondary | ICD-10-CM | POA: Diagnosis not present

## 2022-04-09 DIAGNOSIS — M6281 Muscle weakness (generalized): Secondary | ICD-10-CM

## 2022-04-09 DIAGNOSIS — R4701 Aphasia: Secondary | ICD-10-CM

## 2022-04-09 DIAGNOSIS — R471 Dysarthria and anarthria: Secondary | ICD-10-CM

## 2022-04-09 DIAGNOSIS — R278 Other lack of coordination: Secondary | ICD-10-CM

## 2022-04-09 DIAGNOSIS — I69351 Hemiplegia and hemiparesis following cerebral infarction affecting right dominant side: Secondary | ICD-10-CM | POA: Diagnosis not present

## 2022-04-09 DIAGNOSIS — R208 Other disturbances of skin sensation: Secondary | ICD-10-CM
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
## 2022-04-09 NOTE — Patient Instructions (Signed)
LIP and TONGUE exercises  --- DO ALL EXERCISES TWICE EACH DAY  1. Lip press - Press your lips together firmly (like making a /m/ sound) and hold 5 seconds -repeat 10 times  2. LOUD and LONG Pryor Montes - make a kissing sound as loud as you can, as long as you can - repeat 10 times  3. Tongue sweep - Sweep your tongue in the pockets of your mouth - touch every tooth  - five revolutions each way  4. PUH! - 10 times       MAKE THE SOUNDS STRONG      TUH - 10 times      KUH! - 10 times  5. Move a licorice stick or a straw from side to side in your mouth - 10 back and forth movements  6. Say "OOOOO", and "EEEEE" 10 times (Kiss and smile)  7. Put air in your cheeks, and push on either side 10 times  *KEEP THE AIR IN and DON'T LET IT ESCAPE!!*

## 2022-04-09 NOTE — Telephone Encounter (Signed)
I called pt's husband back. Pt is requesting to be out of work from 01/21/2022-01/25/2023 due to stroke activity and residual deficits.   I advised dr. Terrace Arabia is out of the office till Monday (11/20) but will have review upon return.  He verbalized understanding and appreciation for the call.

## 2022-04-09 NOTE — Therapy (Signed)
OUTPATIENT OCCUPATIONAL THERAPY  Treatment Note  Patient Name: Chloe Johnson MRN: [id number] DOB:1956-02-17, 66 y.o., female Today's Date: 04/09/2022  PCP: Charlane Ferretti, MD REFERRING PROVIDER: Izora Ribas, MD    OT End of Session - 04/09/22 1537     Visit Number 9    Number of Visits 17    Date for OT Re-Evaluation 04/25/22    Authorization Type [employer]    OT Start Time 6295    OT Stop Time 2841    OT Time Calculation (min) 40 min                   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/16/2013   L3-4/notes 11/24/2013, arms, neck   Exertional asthma    GERD (gastroesophageal reflux disease)    Hypertension    Kidney stones    "have always passed them"   Neck pain    Osteomyelitis ([place]) 11/23/2013   osteomyelitis, discitis    Sleep concern    uses Prozac for sleep    Past Surgical History:  Procedure Laterality Date   BREAST CYST EXCISION Left 2010   "polypectomy"   IR CT HEAD LTD  01/21/2022   IR CT HEAD LTD  01/28/2022   IR PERCUTANEOUS ART THROMBECTOMY/INFUSION INTRACRANIAL INC DIAG ANGIO  01/21/2022   IR PERCUTANEOUS ART THROMBECTOMY/INFUSION INTRACRANIAL INC DIAG ANGIO  01/28/2022   IR US GUIDE VASC ACCESS RIGHT  01/22/2022   PICC LINE PLACE PERIPHERAL ([place] [facility]) Right    for use of Levaquin & Vancomycin, of note: she reports that she had a "flulike feeling"    RADIOLOGY WITH ANESTHESIA N/A 01/21/2022   Procedure: IR WITH ANESTHESIA;  Surgeon: Luanne Bras, MD;  Location: [facility];  Service: Radiology;  Laterality: N/A;   RADIOLOGY WITH ANESTHESIA N/A 01/28/2022   Procedure: IR WITH ANESTHESIA;  Surgeon: Radiologist, Medication, MD;  Location: [facility];  Service: Radiology;  Laterality: N/A;   [place]   Patient Active Problem List   Diagnosis Date Noted   Aphasia 03/31/2022   Snores 03/31/2022   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   UTI  (urinary tract infection) 02/04/2022   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   Stroke determined by clinical assessment ([place]) 01/28/2022   Middle cerebral artery embolism, left 01/28/2022   H/O ischemic left MCA stroke    Acute respiratory failure (HCC)    CVA (cerebral vascular accident) ([place]) 01/21/2022   NSTEMI (non-ST elevated myocardial infarction) ([place]) 01/21/2022   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   HFrEF (heart failure with reduced ejection fraction) ([place]) 01/21/2022   Hyperlipidemia 01/21/2022   Lumbar stenosis 01/21/2022   Stroke (cerebrum) ([place]) 01/21/2022   S/P lumbar spinal fusion 11/15/2014   Lumbar discitis 11/29/2013   Discitis 11/24/2013   Diarrhea 11/17/2013   Osteomyelitis of lumbar spine ([place]) 11/16/2013   Hypertension    Asthma    Anxiety     ONSET DATE: 01/28/22  REFERRING DIAG: I63.9 (ICD-10-CM) - Cerebral infarction, unspecified   THERAPY DIAG:  Hemiplegia and hemiparesis following cerebral infarction affecting right dominant side (HCC)  Other lack of coordination  Muscle weakness (generalized)  Other disturbances of skin sensation  Rationale for Evaluation and Treatment Rehabilitation  SUBJECTIVE:   SUBJECTIVE STATEMENT: Pt reports that she is getting an MRI on Friday. Pt accompanied by: self and significant  other (spouse)  PERTINENT HISTORY: PMH: HTN, cervical radiculopathy, hepatitis, osteomyelitis/discitis, lumbar stenosis with foot drop anxiety, GERD, insomnia  PRECAUTIONS: Fall  WEIGHT BEARING RESTRICTIONS  was awaiting spinal fusion surgery, limit bending, twisting, lifting >10#  PAIN:  Are you having pain? Yes: NPRS scale: 4/10 Pain location: R shoulder  Pain description: aching, sharp, shooting Aggravating factors: certain movements Relieving factors: tylenol, lyrica  FALLS: Has patient fallen in last 6 months? Yes. Number of falls 2  LIVING ENVIRONMENT: Lives with: lives with their spouse Lives  in: House/apartment Stairs: Yes: Internal: full flights of steps; on right going up and External: 7 steps; on left going up Has following equipment at home: Single point cane, Walker - 2 wheeled, Walker - 4 wheeled, shower chair, and bed side commode  PLOF: Independent  PATIENT GOALS I want my arm back.    OBJECTIVE:   HAND DOMINANCE: Right  ADLs: Transfers/ambulation related to ADLs: Mod I with no use of  Eating: Husband assists with cutting, feeding self with L hand Grooming: utilizing L hand with opening and brushing teeth UB Dressing: unable to manage buttons, otherwise Mod I LB Dressing: unable to tie shoes, just slipping on toes Toileting: Mod I with use of L hand Bathing: Mod I, utilizing L hand to shave Tub Shower transfers: Mod I Equipment: Shower seat with back, Walk in shower, and bed side commode   IADLs: Light housekeeping: laundry and hanging up clothes, wipes counters/sink, needs assist to fold clothing Meal Prep: no, did not cook pre-stroke Medication management: husband opens pill bottles Handwriting: unable  MOBILITY STATUS: Needs Assist: Supervision - Mod I without use of AD  POSTURE COMMENTS:  No Significant postural limitations  ACTIVITY TOLERANCE: Activity tolerance: WNL for tasks assessed on eval  FUNCTIONAL OUTCOME MEASURES: FOTO: 49  UPPER EXTREMITY ROM     Active ROM Right eval Left eval  Shoulder flexion WNL WNL  Shoulder abduction    Shoulder adduction    Shoulder extension    Shoulder internal rotation    Shoulder external rotation    Elbow flexion    Elbow extension -5 WNL  Wrist flexion 38 WNL  Wrist extension 50 WNL  Wrist ulnar deviation    Wrist radial deviation    Wrist pronation    Wrist supination    (Blank rows = not tested)   UPPER EXTREMITY MMT:     MMT Right eval Left eval  Shoulder flexion 4+/5   Shoulder abduction    Shoulder adduction    Shoulder extension    Shoulder internal rotation    Shoulder  external rotation    Middle trapezius    Lower trapezius    Elbow flexion 4+/5   Elbow extension 4+/5   Wrist flexion    Wrist extension    Wrist ulnar deviation    Wrist radial deviation    Wrist pronation    Wrist supination    (Blank rows = not tested)  HAND FUNCTION: Loose gross grasp, unable to squeeze dynamometer  COORDINATION: Box and Blocks:  Right 10 blocks, Left 53 blocks Modified performance with RUE with therapist holding blocks in palm and pt able to complete gross grasp to pick up one at a time from OT palm of hand, minimal hand opening/closing 04/02/22: Right: 16 blocks  SENSATION: Light touch: WFL Stereognosis: Impaired  Hot/Cold: Impaired  Proprioception: Impaired   COGNITION: Overall cognitive status: Within functional limits for tasks assessed  VISION: Subjective report: Pt reports that her R eye  does not close as well and that she feels that her L eye is compensating for her R eye. Baseline vision: Wears glasses all the time  VISION ASSESSMENT: To be further assessed in functional context  --------------------------------------------------------------------------------------------------------------------------------------------------------------------- (objective measures above completed at initial evaluation unless otherwise dated)  TODAY'S TREATMENT:  04/09/22 Driving: Pt asking about return to driving.  She states that on a medical form that her MD had stated driving, but wanted to make sure that she could resume driving.  OT encouraged pt to reach out to MD to confirm no restrictions with resuming driving.  OT also discussed typical resumption of driving recommendations with pt reporting understanding.   Coordination: engaged in stacking activity with focus on precision pinch to pick up various sized objects.  Pt unable to completely participate in stacking aspect of activity due to decreased coordination and motor control, however demonstrating  improved control with picking up items and then when removing from stack with RUE. WB: engaged in WB through [place] in standing while reaching outside BOS and across midline with LUE to further facilitate WB.  OT educated on functional tasks in which she can incorporate WB to continue to focus on NMR.    04/07/22 Towel slides: shoulder flexion and horizontal abduction on table top with focus on gentle, full ROM in pain free range. Coordination: large grip peg board pattern replication with RUE.  OT providing intermittent tactile cues at shoulder to decrease shoulder hike and lateral lean.  Pt notes progress with peg board activity, however also notes impact of spasticity on movement especially coordination. Pt asking questions about movements in L hand during attempts at exercises with RUE.  Discussed overflow movement s/p neurological event and provided with explanation of reasoning of overflow movements.     04/02/22 Box and Blocks: 16 with Right in standardized manner. Pt demonstrating improved pincer grasp. Towel slides: shoulder flexion and flexion at 45* angle on table top with focus on gentle, full ROM in pain free range. Tendon glides: utilized mirror for visual feedback and pt with decreased sensation and proprioception in UE.  OT providing intermittent tactile cues for improved technique, especially with wrist flexion/extension with medial nerve glide with neck flexion.  Pt reports some pain with ulnar nerve glide during movement but not at end range.   Shoulder flexion/gross grasp: engaged in reaching at 45* angle to pick up cones and stack, then move cones across midline.  Pt demonstrating improved gross grasp and shoulder flexion to allow pt to maintain grasp while placing on vertical stack.  No increased pain with reaching outsdie BOS or across midline this session.   PATIENT EDUCATION: Education details: Spasticity onset, common effects, and treatment strategies, nerve glides, functional  reach and grasp Person educated: Patient Education method: Explanation Education comprehension: verbalized understanding and needs further education   HOME EXERCISE PROGRAM: Fine motor coordination handout  Access Code: [email or web address] URL: [email or web address] Date: 03/20/2022 Prepared by: [facility]  Exercises - Seated Shoulder Shrug  - 1 sets - 10 reps - Seated Scapular Retraction  - 1 sets - 10 reps - Seated Shoulder Rolls  - 1 sets - 10 reps - Seated Shoulder Flexion Towel Slide at Table Top Full Range of Motion  - 1 sets - 10 reps  Access Code: [email or web address] URL: [email or web address] Date: 03/28/2022 Prepared by: [facility]  Exercises - Standing Median Nerve Glide  - 2-3 x daily - 1 sets -  10 reps - Standing Ulnar Nerve Glide  - 2-3 x daily - 1 sets - 10 reps - Standing Radial Nerve Glide  - 2-3 x daily - 1 sets - 10 reps - Median Nerve Glide  - 2-3 x daily - 1 sets - 10 reps - Seated Digit Tendon Gliding  - 2-3 x daily - 1 sets - 10 reps - Open Book Chest Stretch on Towel Roll  - 1 x daily - 2 sets - 10 reps - [place]  - 1 x daily - 2 sets - 10 reps - Supine Shoulder Press with [place]el  - 1 x daily - 2 sets - 10 reps - Supine Shoulder Flexion with Dowel to 90  - 1 x daily - 2 sets - 10 reps    GOALS: Goals reviewed with patient? Yes  SHORT TERM GOALS: Target date: 03/28/22  Pt will be independent with HEP for fine and gross motor control. Baseline: Goal status: MET - 03/28/22  2.  Pt will demonstrate improved UE functional use for ADLs as evidenced by increasing functional use of RUE to pick up blocks to complete Box and Blocks assessment in standardized manner. Baseline:  Goal status: MET - moved 16 blocks with R on 04/02/22  3.  Pt will verbalize understanding with safety considerations and AE needs to ensure safety d/t decreased sensation. Baseline:  Goal status:  MET - 03/28/22   LONG TERM GOALS: Target date: 04/25/22  Pt and spouse will verbalize understanding for return to driving recommendations and pursue driving eval PRN. Baseline:  Goal status: IN PROGRESS  2.  Pt will demonstrate improved UE functional use for ADLs as evidenced by increasing box/ blocks score by 10 blocks with RUE Baseline: unable to move any from box, however able to move 10 with gross grasp from blocks placed in OT palm Goal status: IN PROGRESS  3.  Pt will demonstrate increased functional reach and grasp to obtain light weight item from moderate height shelf. Baseline:  Goal status: IN PROGRESS  4.  Pt will demonstrate increased hand function and grasp to open containers (jars, medications) and verbalize understanding of AE PRN to increase ease, safety, and independence with opening containers. Baseline:  Goal status: IN PROGRESS  5.  Pt will demonstrate use of BUE together to engage in functional IADL tasks (simple meal prep, folding laundry, etc). Baseline:  Goal status: IN PROGRESS   ASSESSMENT:  CLINICAL IMPRESSION: Treatment session with focus on therapeutic activities and NMR with focus on movement through pain free range, reiterating education on spasticity effects and treatment. Pt asking questions about deep tissue massage for breaking up tone.  Pt demonstrating improved functional grasp and precision grasp during coordination activity this session, still demonstrating decreased motor control with ability to stack items with RUE.   PERFORMANCE DEFICITS in functional skills including ADLs, IADLs, coordination, dexterity, sensation, ROM, strength, FMC, GMC, balance, body mechanics, decreased knowledge of use of DME, and UE functional use and psychosocial skills including environmental adaptation, habits, and routines and behaviors.   IMPAIRMENTS are limiting patient from ADLs, IADLs, and work.   COMORBIDITIES may have co-morbidities  that affects occupational  performance. Patient will benefit from skilled OT to address above impairments and improve overall function.  MODIFICATION OR ASSISTANCE TO COMPLETE EVALUATION: Min-Moderate modification of tasks or assist with assess necessary to complete an evaluation.  OT OCCUPATIONAL PROFILE AND HISTORY: Detailed assessment: Review of records and additional review of physical, cognitive, psychosocial history related to current  functional performance.  CLINICAL DECISION MAKING: Moderate - several treatment options, min-mod task modification necessary  REHAB POTENTIAL: Good  EVALUATION COMPLEXITY: Moderate    PLAN: OT FREQUENCY: 2x/week  OT DURATION: 8 weeks  PLANNED INTERVENTIONS: self care/ADL training, therapeutic exercise, therapeutic activity, neuromuscular re-education, manual therapy, passive range of motion, balance training, functional mobility training, splinting, electrical stimulation, ultrasound, compression bandaging, moist heat, cryotherapy, patient/family education, psychosocial skills training, energy conservation, coping strategies training, and DME and/or AE instructions  RECOMMENDED OTHER SERVICES: N/A  CONSULTED AND AGREED WITH PLAN OF CARE: Patient and family member/caregiver  PLAN FOR NEXT SESSION: Review and begin with supine there ex and nerve glides as pain in RUE persists.  Engage in wrist ROM and hand opening/closing and attempt finger isolation exercises/activities. Gross grasp and reach activities.  Progress to increased strengthening as able.  Massage (as appropriate) and tone management.   Simonne Come, OTR/L 04/09/2022, 3:37 PM

## 2022-04-10 ENCOUNTER — Ambulatory Visit: Payer: Federal, State, Local not specified - PPO | Attending: Cardiovascular Disease | Admitting: *Deleted

## 2022-04-10 ENCOUNTER — Encounter: Payer: Self-pay | Admitting: Physical Medicine and Rehabilitation

## 2022-04-10 DIAGNOSIS — I513 Intracardiac thrombosis, not elsewhere classified: Secondary | ICD-10-CM | POA: Diagnosis not present

## 2022-04-10 DIAGNOSIS — I639 Cerebral infarction, unspecified: Secondary | ICD-10-CM

## 2022-04-10 LAB — POCT INR: INR: 2.4 (ref 2.0–3.0)

## 2022-04-10 NOTE — Patient Instructions (Signed)
Description   Continue taking warfarin 1 tablet daily except 1.5 tablets on Sundays. Continue green leafy veggie to weekly. Recheck INR in 11 days.  [facility] [phone]

## 2022-04-11 ENCOUNTER — Ambulatory Visit
Admission: RE | Admit: 2022-04-11 | Discharge: 2022-04-11 | Disposition: A | Discharge status: Home / Self Care | Payer: Federal, State, Local not specified - PPO | Source: Ambulatory Visit | Attending: Neurology | Admitting: Neurology

## 2022-04-11 DIAGNOSIS — I63132 Cerebral infarction due to embolism of left carotid artery: Secondary | ICD-10-CM | POA: Diagnosis not present

## 2022-04-11 DIAGNOSIS — R4701 Aphasia: Secondary | ICD-10-CM

## 2022-04-14 ENCOUNTER — Ambulatory Visit: Payer: Federal, State, Local not specified - PPO

## 2022-04-14 ENCOUNTER — Ambulatory Visit: Payer: Federal, State, Local not specified - PPO | Admitting: Occupational Therapy

## 2022-04-14 DIAGNOSIS — I69351 Hemiplegia and hemiparesis following cerebral infarction affecting right dominant side: Secondary | ICD-10-CM | POA: Diagnosis not present

## 2022-04-14 DIAGNOSIS — R482 Apraxia: Secondary | ICD-10-CM | POA: Diagnosis not present

## 2022-04-14 DIAGNOSIS — R4701 Aphasia: Secondary | ICD-10-CM | POA: Diagnosis not present

## 2022-04-14 DIAGNOSIS — R471 Dysarthria and anarthria: Secondary | ICD-10-CM | POA: Diagnosis not present

## 2022-04-14 DIAGNOSIS — R278 Other lack of coordination: Secondary | ICD-10-CM | POA: Diagnosis not present

## 2022-04-14 DIAGNOSIS — M6281 Muscle weakness (generalized): Secondary | ICD-10-CM

## 2022-04-14 DIAGNOSIS — R208 Other disturbances of skin sensation: Secondary | ICD-10-CM | POA: Diagnosis not present

## 2022-04-14 LAB — BETA-2-GLYCOPROTEIN I ABS, IGG/M/A
Beta-2 Glyco 1 IgA: <9 GPI IgA units (ref 0–25)
Beta-2 Glyco 1 IgM: <9 GPI IgM units (ref 0–32)
Beta-2 Glyco I IgG: <9 GPI IgG units (ref 0–20)

## 2022-04-14 LAB — FACTOR 5 LEIDEN

## 2022-04-14 LAB — LUPUS ANTICOAGULANT PANEL
Dilute Viper Venom Time: 46.1 s (ref 0.0–47.0)
PTT Lupus Anticoagulant: 36.2 s (ref 0.0–43.5)

## 2022-04-14 LAB — PROTHROMBIN GENE MUTATION

## 2022-04-14 LAB — PROTEIN C ACTIVITY: Protein C Activity: 101 % (ref 73–180)

## 2022-04-14 LAB — PROTEIN S, TOTAL: Protein S Ag, Total: 32 % — ABNORMAL LOW (ref 60–150)

## 2022-04-14 LAB — CARDIOLIPIN ANTIBODIES, IGG, IGM, IGA
Anticardiolipin IgA: <9 APL U/mL (ref 0–11)
Anticardiolipin IgG: <9 GPL U/mL (ref 0–14)
Anticardiolipin IgM: <9 MPL U/mL (ref 0–12)

## 2022-04-14 LAB — PROTEIN S ACTIVITY: Protein S Activity: 20 % — ABNORMAL LOW (ref 63–140)

## 2022-04-14 LAB — HOMOCYSTEINE: Homocysteine: 9.3 umol/L (ref 0.0–17.2)

## 2022-04-14 LAB — ANTITHROMBIN III: AntiThromb III Func: 94 % (ref 75–135)

## 2022-04-14 LAB — PROTEIN C, TOTAL: Protein C Antigen: 93 % (ref 60–150)

## 2022-04-14 NOTE — Therapy (Signed)
OUTPATIENT OCCUPATIONAL THERAPY  Treatment Note  Patient Name: JARITA RAVAL MRN: [id number] DOB:11-Aug-1955, 66 y.o., female Today's Date: 04/14/2022  PCP: Charlane Ferretti, MD REFERRING PROVIDER: Izora Ribas, MD    OT End of Session - 04/14/22 1200     Visit Number 10    Number of Visits 17    Date for OT Re-Evaluation 04/25/22    Authorization Type [employer]    OT Start Time 1150    OT Stop Time 1230    OT Time Calculation (min) 40 min                    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/16/2013   L3-4/notes 11/24/2013, arms, neck   Exertional asthma    GERD (gastroesophageal reflux disease)    Hypertension    Kidney stones    "have always passed them"   Neck pain    Osteomyelitis ([place]) 11/23/2013   osteomyelitis, discitis    Sleep concern    uses Prozac for sleep    Past Surgical History:  Procedure Laterality Date   BREAST CYST EXCISION Left 2010   "polypectomy"   IR CT HEAD LTD  01/21/2022   IR CT HEAD LTD  01/28/2022   IR PERCUTANEOUS ART THROMBECTOMY/INFUSION INTRACRANIAL INC DIAG ANGIO  01/21/2022   IR PERCUTANEOUS ART THROMBECTOMY/INFUSION INTRACRANIAL INC DIAG ANGIO  01/28/2022   IR US GUIDE VASC ACCESS RIGHT  01/22/2022   PICC LINE PLACE PERIPHERAL ([place] [facility]) Right    for use of Levaquin & Vancomycin, of note: she reports that she had a "flulike feeling"    RADIOLOGY WITH ANESTHESIA N/A 01/21/2022   Procedure: IR WITH ANESTHESIA;  Surgeon: Luanne Bras, MD;  Location: [facility];  Service: Radiology;  Laterality: N/A;   RADIOLOGY WITH ANESTHESIA N/A 01/28/2022   Procedure: IR WITH ANESTHESIA;  Surgeon: Radiologist, Medication, MD;  Location: [facility];  Service: Radiology;  Laterality: N/A;   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   Stroke determined by clinical assessment ([place]) 01/28/2022   Middle cerebral artery embolism, left 01/28/2022   H/O ischemic left MCA stroke    Acute respiratory failure (HCC)    CVA (cerebral vascular accident) ([place]) 01/21/2022   NSTEMI (non-ST elevated myocardial infarction) ([place]) 01/21/2022   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   HFrEF (heart failure with reduced ejection fraction) ([place]) 01/21/2022   Hyperlipidemia 01/21/2022   Lumbar stenosis 01/21/2022   Stroke (cerebrum) ([place]) 01/21/2022   S/P lumbar spinal fusion 11/15/2014   Lumbar discitis 11/29/2013   Discitis 11/24/2013   Diarrhea 11/17/2013   Osteomyelitis of lumbar spine ([place]) 11/16/2013   Hypertension    Asthma    Anxiety     ONSET DATE: 01/28/22  REFERRING DIAG: I63.9 (ICD-10-CM) - Cerebral infarction, unspecified   THERAPY DIAG:  Hemiplegia and hemiparesis following cerebral infarction affecting right dominant side (HCC)  Other lack of coordination  Muscle weakness (generalized)  Other disturbances of skin sensation  Rationale for Evaluation and Treatment Rehabilitation  SUBJECTIVE:   SUBJECTIVE STATEMENT: Pt reports that she drove herself to the appointment today. Pt accompanied by: self and  significant other (spouse)  PERTINENT HISTORY: PMH: HTN, cervical radiculopathy, hepatitis, osteomyelitis/discitis, lumbar stenosis with foot drop anxiety, GERD, insomnia  PRECAUTIONS: Fall  WEIGHT BEARING RESTRICTIONS  was awaiting spinal fusion surgery, limit bending, twisting, lifting >10#  PAIN:  Are you having pain? Yes: NPRS scale: 4/10 Pain location: neck, back, R arm Pain description: aching Aggravating factors: certain movements Relieving factors: tylenol, lyrica  FALLS: Has patient fallen in last 6 months? Yes. Number of falls 2  LIVING ENVIRONMENT: Lives with: lives with their spouse Lives  in: House/apartment Stairs: Yes: Internal: full flights of steps; on right going up and External: 7 steps; on left going up Has following equipment at home: Single point cane, Walker - 2 wheeled, Walker - 4 wheeled, shower chair, and bed side commode  PLOF: Independent  PATIENT GOALS I want my arm back.    OBJECTIVE:   HAND DOMINANCE: Right  ADLs: Transfers/ambulation related to ADLs: Mod I with no use of  Eating: Husband assists with cutting, feeding self with L hand Grooming: utilizing L hand with opening and brushing teeth UB Dressing: unable to manage buttons, otherwise Mod I LB Dressing: unable to tie shoes, just slipping on toes Toileting: Mod I with use of L hand Bathing: Mod I, utilizing L hand to shave Tub Shower transfers: Mod I Equipment: Shower seat with back, Walk in shower, and bed side commode   IADLs: Light housekeeping: laundry and hanging up clothes, wipes counters/sink, needs assist to fold clothing Meal Prep: no, did not cook pre-stroke Medication management: husband opens pill bottles Handwriting: unable  MOBILITY STATUS: Needs Assist: Supervision - Mod I without use of AD  POSTURE COMMENTS:  No Significant postural limitations  ACTIVITY TOLERANCE: Activity tolerance: WNL for tasks assessed on eval  FUNCTIONAL OUTCOME MEASURES: FOTO: 49  UPPER EXTREMITY ROM     Active ROM Right eval Left eval  Shoulder flexion WNL WNL  Shoulder abduction    Shoulder adduction    Shoulder extension    Shoulder internal rotation    Shoulder external rotation    Elbow flexion    Elbow extension -5 WNL  Wrist flexion 38 WNL  Wrist extension 50 WNL  Wrist ulnar deviation    Wrist radial deviation    Wrist pronation    Wrist supination    (Blank rows = not tested)   UPPER EXTREMITY MMT:     MMT Right eval Left eval  Shoulder flexion 4+/5   Shoulder abduction    Shoulder adduction    Shoulder extension    Shoulder internal rotation    Shoulder  external rotation    Middle trapezius    Lower trapezius    Elbow flexion 4+/5   Elbow extension 4+/5   Wrist flexion    Wrist extension    Wrist ulnar deviation    Wrist radial deviation    Wrist pronation    Wrist supination    (Blank rows = not tested)  HAND FUNCTION: Loose gross grasp, unable to squeeze dynamometer  COORDINATION: Box and Blocks:  Right 10 blocks, Left 53 blocks Modified performance with RUE with therapist holding blocks in palm and pt able to complete gross grasp to pick up one at a time from OT palm of hand, minimal hand opening/closing 04/02/22: Right: 16 blocks  SENSATION: Light touch: WFL Stereognosis: Impaired  Hot/Cold: Impaired  Proprioception: Impaired   COGNITION: Overall cognitive status: Within functional limits for tasks assessed  VISION: Subjective report: Pt reports that her R eye  does not close as well and that she feels that her L eye is compensating for her R eye. Baseline vision: Wears glasses all the time  VISION ASSESSMENT: To be further assessed in functional context  --------------------------------------------------------------------------------------------------------------------------------------------------------------------- (objective measures above completed at initial evaluation unless otherwise dated)  TODAY'S TREATMENT:  04/14/22 Supine exercises: engaged in snow angel slides along mat, "open book" chest stretch, single RUE shoulder flexion to 90*.  Pt with reports of increased pain in RUE with all movements against gravity.  Therefore transitioned to sidelying. Sidelying exercises: towel glides on table top at neutral, progressing to 45* angle for increased shoulder flexion.  Pt with on c/o pain in this position.  Attempted shoulder abduction in sidelying, however pt with increased pain therefore terminated task.   Coordination: Engaged in placing large grip pegs into peg board.  Pt demonstrating improved precision grasp  and ability to manipulate pegs.  OT providing min cues for technique and to not utilize shoulder hike for compensatory movements. 9 hole peg test: Left: 29.97 sec Right: able to place 2 pegs in 2 min time limit, utilizing gross grasp, scooping peg from container instead of precision pinch, and shoulder internal rotation and compensatory hike and lateral lean.     04/09/22 Driving: Pt asking about return to driving.  She states that on a medical form that her MD had stated driving, but wanted to make sure that she could resume driving.  OT encouraged pt to reach out to MD to confirm no restrictions with resuming driving.  OT also discussed typical resumption of driving recommendations with pt reporting understanding.   Coordination: engaged in stacking activity with focus on precision pinch to pick up various sized objects.  Pt unable to completely participate in stacking aspect of activity due to decreased coordination and motor control, however demonstrating improved control with picking up items and then when removing from stack with RUE. WB: engaged in WB through [place] in standing while reaching outside BOS and across midline with LUE to further facilitate WB.  OT educated on functional tasks in which she can incorporate WB to continue to focus on NMR.    04/07/22 Towel slides: shoulder flexion and horizontal abduction on table top with focus on gentle, full ROM in pain free range. Coordination: large grip peg board pattern replication with RUE.  OT providing intermittent tactile cues at shoulder to decrease shoulder hike and lateral lean.  Pt notes progress with peg board activity, however also notes impact of spasticity on movement especially coordination. Pt asking questions about movements in L hand during attempts at exercises with RUE.  Discussed overflow movement s/p neurological event and provided with explanation of reasoning of overflow movements.     PATIENT EDUCATION: Education details:  Spasticity onset, common effects, and treatment strategies, nerve glides, functional reach and grasp Person educated: Patient Education method: Explanation Education comprehension: verbalized understanding and needs further education   HOME EXERCISE PROGRAM: Fine motor coordination handout  Access Code: [email or web address] URL: [email or web address] Date: 03/20/2022 Prepared by: [facility]  Exercises - Seated Shoulder Shrug  - 1 sets - 10 reps - Seated Scapular Retraction  - 1 sets - 10 reps - Seated Shoulder Rolls  - 1 sets - 10 reps - Seated Shoulder Flexion Towel Slide at Table Top Full Range of Motion  - 1 sets - 10 reps  Access Code: [email or web address] URL: [email or web address] Date: 03/28/2022 Prepared by: [facility]  Exercises - Standing Median Nerve Glide  - 2-3 x daily - 1 sets - 10 reps - Standing Ulnar Nerve Glide  - 2-3 x daily - 1 sets - 10 reps - Standing Radial Nerve Glide  - 2-3 x daily - 1 sets - 10 reps - Median Nerve Glide  - 2-3 x daily - 1 sets - 10 reps - Seated Digit Tendon Gliding  - 2-3 x daily - 1 sets - 10 reps - Open Book Chest Stretch on Towel Roll  - 1 x daily - 2 sets - 10 reps - Snow Angels  - 1 x daily - 2 sets - 10 reps - Supine Shoulder Press with Dowel  - 1 x daily - 2 sets - 10 reps - Supine Shoulder Flexion with Dowel to 90  - 1 x daily - 2 sets - 10 reps    GOALS: Goals reviewed with patient? Yes  SHORT TERM GOALS: Target date: 03/28/22  Pt will be independent with HEP for fine and gross motor control. Baseline: Goal status: MET - 03/28/22  2.  Pt will demonstrate improved UE functional use for ADLs as evidenced by increasing functional use of RUE to pick up blocks to complete Box and Blocks assessment in standardized manner. Baseline:  Goal status: MET - moved 16 blocks with R on 04/02/22  3.  Pt will verbalize understanding with safety  considerations and AE needs to ensure safety d/t decreased sensation. Baseline:  Goal status: MET - 03/28/22   LONG TERM GOALS: Target date: 04/25/22  Pt and spouse will verbalize understanding for return to driving recommendations and pursue driving eval PRN. Baseline:  Goal status: MET - 04/14/22  2.  Pt will demonstrate improved UE functional use for ADLs as evidenced by increasing box/ blocks score by 10 blocks with RUE Baseline: unable to move any from box, however able to move 10 with gross grasp from blocks placed in OT palm Goal status: IN PROGRESS  3.  Pt will demonstrate increased functional reach and grasp to obtain light weight item from moderate height shelf. Baseline:  Goal status: IN PROGRESS  4.  Pt will demonstrate increased hand function and grasp to open containers (jars, medications) and verbalize understanding of AE PRN to increase ease, safety, and independence with opening containers. Baseline:  Goal status: IN PROGRESS  5.  Pt will demonstrate use of BUE together to engage in functional IADL tasks (simple meal prep, folding laundry, etc). Baseline:  Goal status: IN PROGRESS   ASSESSMENT:  CLINICAL IMPRESSION: Treatment session with focus on supine and sidelying NMR with focus on movement through pain free range, reiterating education on spasticity effects and treatment. Pt continues to demonstrate improved functional grasp and precision grasp with larger pegs and 1x1" blocks; however with significant difficulty with smaller items and in-hand manipulation.  PERFORMANCE DEFICITS in functional skills including ADLs, IADLs, coordination, dexterity, sensation, ROM, strength, FMC, GMC, balance, body mechanics, decreased knowledge of use of DME, and UE functional use and psychosocial skills including environmental adaptation, habits, and routines and behaviors.   IMPAIRMENTS are limiting patient from ADLs, IADLs, and work.   COMORBIDITIES may have co-morbidities   that affects occupational performance. Patient will benefit from skilled OT to address above impairments and improve overall function.  MODIFICATION OR ASSISTANCE TO COMPLETE EVALUATION: Min-Moderate modification of tasks or assist with assess necessary to complete an evaluation.  OT OCCUPATIONAL PROFILE AND HISTORY: Detailed assessment: Review of records and additional review of physical, cognitive, psychosocial  history related to current functional performance.  CLINICAL DECISION MAKING: Moderate - several treatment options, min-mod task modification necessary  REHAB POTENTIAL: Good  EVALUATION COMPLEXITY: Moderate    PLAN: OT FREQUENCY: 2x/week  OT DURATION: 8 weeks  PLANNED INTERVENTIONS: self care/ADL training, therapeutic exercise, therapeutic activity, neuromuscular re-education, manual therapy, passive range of motion, balance training, functional mobility training, splinting, electrical stimulation, ultrasound, compression bandaging, moist heat, cryotherapy, patient/family education, psychosocial skills training, energy conservation, coping strategies training, and DME and/or AE instructions  RECOMMENDED OTHER SERVICES: N/A  CONSULTED AND AGREED WITH PLAN OF CARE: Patient and family member/caregiver  PLAN FOR NEXT SESSION: Review and begin with supine there ex and nerve glides as pain in RUE persists.  Engage in wrist ROM and hand opening/closing and attempt finger isolation exercises/activities. Gross grasp and reach activities.  Progress to increased strengthening as able.  Massage (as appropriate) and tone management.   Simonne Come, OTR/L 04/14/2022, 12:01 PM
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
## 2022-04-15 ENCOUNTER — Ambulatory Visit
Admission: RE | Admit: 2022-04-15 | Discharge: 2022-04-15 | Disposition: A | Discharge status: Home / Self Care | Payer: Federal, State, Local not specified - PPO | Source: Ambulatory Visit | Attending: Physical Medicine and Rehabilitation | Admitting: Physical Medicine and Rehabilitation

## 2022-04-15 ENCOUNTER — Encounter: Payer: Federal, State, Local not specified - PPO | Admitting: Genetic Counselor

## 2022-04-15 DIAGNOSIS — G8929 Other chronic pain: Secondary | ICD-10-CM | POA: Diagnosis not present

## 2022-04-15 DIAGNOSIS — S46011A Strain of muscle(s) and tendon(s) of the rotator cuff of right shoulder, initial encounter: Secondary | ICD-10-CM | POA: Diagnosis not present

## 2022-04-15 DIAGNOSIS — M25511 Pain in right shoulder: Secondary | ICD-10-CM

## 2022-04-15 NOTE — Telephone Encounter (Signed)
Pt's husband Doreatha Offer ;hecking to make sure paperwork was faxed to [facility] due to deadline is tomorrow. Would like a call back.

## 2022-04-15 NOTE — Progress Notes (Signed)
Pre Test GC  Referring Provider: Leodis Sias,  MD   Referral Reason  Chloe Johnson was referred for genetic consult and testing of inherited hypercoagulable conditions considering her recent history of stroke.    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   On August 29th, 2023 as she was feeding her cats before going to  work, she suddenly dropped the plate of food and could not talk. By the time EMS arrived, she says that her speech had recovered. She was taken to the ER and was found to have a stroke. Her echocardiogram detected apical hypokinesis without a true aneurysm and a LV thrombus that was suspected to be the cause of her stroke. She reports having another stroke on 5th Sept and now is undergoing speech therapy as well as PT/OT.    Family history Relation to [place] Pedigree # Current age Heart condition/age of onset Notes  Daughters-2, Son -1 IV.1, IV.2, IV.3 35, 34, 31 None   Sisters-2 III.1, III.2 74, Deceased None III.1- Traumatic brain injury IIII.2- Died @ 70-lung cancer  Brother III.3 69 None         Father II.1 Deceased None Died of COPD @ 51  Paternal grandfather I.1 Deceased None Died of drug reaction @ 77, bladder cancer  Paternal grandmother I.2 Deceased None Died of MVA @ 44        Mother II.2 Deceased None Died of old age @ 88, rheumatoid arthritis  Maternal grandfather I.3 Deceased None Died of pneumonia @ 30s  Maternal grandmother I.4 Deceased None Died of MVA @ 84   Pre-test Genetic Consult notes  I explained to them that an inherited hypercoagulable condition is associated with increasing likelihood of forming blood clots deep in a vein. If the blood clot breaks off it can travel to lungs and block one of the pulmonary arteries leading to a  pulmonary embolism or if in the brain, to a stroke. Defects in the blood clotting cascade, either due to mutations in factor V, factor 2 or deficiencies of antithrombin, protein C and protein S can increase the risk of forming blood clots.   A genetic defect in the background of risk factors can dramatically increase the chance of stroke or DVT. Risk factors include being immobile for long periods, such as bed rest, long flights, surgery, pregnancy, trauma, dehydration, drug abuse to name a few.  Considering the significant family history of DVT in his father and two brothers, it is highly likely that he has inherited the hypercoagulable state condition from his father. This is an autosomal dominant condition; hence his children are at a 50% risk of inheriting this condition. He verbalized understanding of this.   Patient should be aware of the protections afforded by the Genetic Information Non-Discrimination Act (GINA). GINA protects him from losing his employment or health insurance based on his genotype. However, these protections do not cover life insurance and disability. It is recommended that children procure life insurance prior to undergoing genetic testing.  Please note that the patient has not been counseled in this visit on personal, cultural, or ethical issues that they may face due to their heart condition.   Plan After a thorough discussion of the risk and benefits of genetic testing for hypercoagulable states, Chloe Johnson consents to pursue  genetic testing. Blood was drawn today and sent to [facility] for her test.  Sidney Ace, Ph.D, [facility] Clinical Molecular Geneticist

## 2022-04-15 NOTE — Telephone Encounter (Signed)
Dr. Terrace Arabia signed/completed PW today. I have provided complete copy to medical records for processing.

## 2022-04-16 ENCOUNTER — Telehealth: Payer: Self-pay | Admitting: Cardiovascular Disease

## 2022-04-16 ENCOUNTER — Ambulatory Visit: Payer: Federal, State, Local not specified - PPO | Admitting: Occupational Therapy

## 2022-04-16 ENCOUNTER — Ambulatory Visit: Payer: Federal, State, Local not specified - PPO

## 2022-04-16 DIAGNOSIS — I69351 Hemiplegia and hemiparesis following cerebral infarction affecting right dominant side: Secondary | ICD-10-CM | POA: Diagnosis not present

## 2022-04-16 DIAGNOSIS — R4701 Aphasia: Secondary | ICD-10-CM

## 2022-04-16 DIAGNOSIS — R208 Other disturbances of skin sensation: Secondary | ICD-10-CM

## 2022-04-16 DIAGNOSIS — R471 Dysarthria and anarthria: Secondary | ICD-10-CM

## 2022-04-16 DIAGNOSIS — R482 Apraxia: Secondary | ICD-10-CM

## 2022-04-16 DIAGNOSIS — R898 Other abnormal findings in specimens from other organs, systems and tissues: Secondary | ICD-10-CM

## 2022-04-16 DIAGNOSIS — R278 Other lack of coordination: Secondary | ICD-10-CM

## 2022-04-16 DIAGNOSIS — M6281 Muscle weakness (generalized): Secondary | ICD-10-CM

## 2022-04-16 NOTE — Telephone Encounter (Signed)
-----   Message from Vesta Mixer, MD sent at 04/15/2022  6:04 PM EST ----- Her results have been reviewed by Dr. Jomarie Longs. She agrees with a referral to a hematologist . Please see if we can set her up with Dr. Myna Hidalgo

## 2022-04-16 NOTE — Therapy (Signed)
OUTPATIENT OCCUPATIONAL THERAPY  Treatment Note  Patient Name: Chloe Johnson MRN: [id number] DOB:Jul 11, 1955, 66 y.o., female Today's Date: 04/16/2022  PCP: Charlane Ferretti, MD REFERRING PROVIDER: Izora Ribas, MD    OT End of Session - 04/16/22 1154     Visit Number 11    Number of Visits 17    Date for OT Re-Evaluation 04/25/22    Authorization Type [employer]    OT Start Time 1150    OT Stop Time 1230    OT Time Calculation (min) 40 min                     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/16/2013   L3-4/notes 11/24/2013, arms, neck   Exertional asthma    GERD (gastroesophageal reflux disease)    Hypertension    Kidney stones    "have always passed them"   Neck pain    Osteomyelitis ([facility]) 11/23/2013   osteomyelitis, discitis    Sleep concern    uses Prozac for sleep    Past Surgical History:  Procedure Laterality Date   BREAST CYST EXCISION Left 2010   "polypectomy"   IR CT HEAD LTD  01/21/2022   IR CT HEAD LTD  01/28/2022   IR PERCUTANEOUS ART THROMBECTOMY/INFUSION INTRACRANIAL INC DIAG ANGIO  01/21/2022   IR PERCUTANEOUS ART THROMBECTOMY/INFUSION INTRACRANIAL INC DIAG ANGIO  01/28/2022   IR US GUIDE VASC ACCESS RIGHT  01/22/2022   PICC LINE PLACE PERIPHERAL ([facility]) Right    for use of Levaquin & Vancomycin, of note: she reports that she had a "flulike feeling"    RADIOLOGY WITH ANESTHESIA N/A 01/21/2022   Procedure: IR WITH ANESTHESIA;  Surgeon: Luanne Bras, MD;  Location: [facility];  Service: Radiology;  Laterality: N/A;   RADIOLOGY WITH ANESTHESIA N/A 01/28/2022   Procedure: IR WITH ANESTHESIA;  Surgeon: Radiologist, Medication, MD;  Location: [facility];  Service: Radiology;  Laterality: N/A;   [place]   Patient Active Problem List   Diagnosis Date Noted   Aphasia 03/31/2022   Snores 03/31/2022   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   UTI  (urinary tract infection) 02/04/2022   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[facility]) 02/04/2022   Stroke determined by clinical assessment ([facility]) 01/28/2022   Middle cerebral artery embolism, left 01/28/2022   H/O ischemic left MCA stroke    Acute respiratory failure (HCC)    CVA (cerebral vascular accident) ([place]) 01/21/2022   NSTEMI (non-ST elevated myocardial infarction) ([facility]) 01/21/2022   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   HFrEF (heart failure with reduced ejection fraction) ([place]) 01/21/2022   Hyperlipidemia 01/21/2022   Lumbar stenosis 01/21/2022   Stroke (cerebrum) ([place]) 01/21/2022   S/P lumbar spinal fusion 11/15/2014   Lumbar discitis 11/29/2013   Discitis 11/24/2013   Diarrhea 11/17/2013   Osteomyelitis of lumbar spine ([facility]) 11/16/2013   Hypertension    Asthma    Anxiety     ONSET DATE: 01/28/22  REFERRING DIAG: I63.9 (ICD-10-CM) - Cerebral infarction, unspecified   THERAPY DIAG:  Other lack of coordination  Hemiplegia and hemiparesis following cerebral infarction affecting right dominant side (HCC)  Muscle weakness (generalized)  Other disturbances of skin sensation  Rationale for Evaluation and Treatment Rehabilitation  SUBJECTIVE:   SUBJECTIVE STATEMENT: Pt reports that she had her MRI yesterday but has not gotten the results.  Pt accompanied by: self and significant other (spouse)  PERTINENT HISTORY: PMH: HTN, cervical radiculopathy, hepatitis, osteomyelitis/discitis, lumbar stenosis with foot drop anxiety, GERD, insomnia  PRECAUTIONS: Fall  WEIGHT BEARING RESTRICTIONS  was awaiting spinal fusion surgery, limit bending, twisting, lifting >10#  PAIN:  Are you having pain? No  FALLS: Has patient fallen in last 6 months? Yes. Number of falls 2  LIVING ENVIRONMENT: Lives with: lives with their spouse Lives in: House/apartment Stairs: Yes: Internal: full flights of steps; on right going up and External: 7 steps; on left going up Has following  equipment at home: Single point cane, Walker - 2 wheeled, Walker - 4 wheeled, shower chair, and bed side commode  PLOF: Independent  PATIENT GOALS I want my arm back.    OBJECTIVE:   HAND DOMINANCE: Right  ADLs: Transfers/ambulation related to ADLs: Mod I with no use of  Eating: Husband assists with cutting, feeding self with L hand Grooming: utilizing L hand with opening and brushing teeth UB Dressing: unable to manage buttons, otherwise Mod I LB Dressing: unable to tie shoes, just slipping on toes Toileting: Mod I with use of L hand Bathing: Mod I, utilizing L hand to shave Tub Shower transfers: Mod I Equipment: Shower seat with back, Walk in shower, and bed side commode   IADLs: Light housekeeping: laundry and hanging up clothes, wipes counters/sink, needs assist to fold clothing Meal Prep: no, did not cook pre-stroke Medication management: husband opens pill bottles Handwriting: unable  MOBILITY STATUS: Needs Assist: Supervision - Mod I without use of AD  POSTURE COMMENTS:  No Significant postural limitations  ACTIVITY TOLERANCE: Activity tolerance: WNL for tasks assessed on eval  FUNCTIONAL OUTCOME MEASURES: FOTO: 49  UPPER EXTREMITY ROM     Active ROM Right eval Left eval  Shoulder flexion WNL WNL  Shoulder abduction    Shoulder adduction    Shoulder extension    Shoulder internal rotation    Shoulder external rotation    Elbow flexion    Elbow extension -5 WNL  Wrist flexion 38 WNL  Wrist extension 50 WNL  Wrist ulnar deviation    Wrist radial deviation    Wrist pronation    Wrist supination    (Blank rows = not tested)   UPPER EXTREMITY MMT:     MMT Right eval Left eval  Shoulder flexion 4+/5   Shoulder abduction    Shoulder adduction    Shoulder extension    Shoulder internal rotation    Shoulder external rotation    Middle trapezius    Lower trapezius    Elbow flexion 4+/5   Elbow extension 4+/5   Wrist flexion    Wrist extension     Wrist ulnar deviation    Wrist radial deviation    Wrist pronation    Wrist supination    (Blank rows = not tested)  HAND FUNCTION: Loose gross grasp, unable to squeeze dynamometer  COORDINATION: Box and Blocks:  Right 10 blocks, Left 53 blocks Modified performance with RUE with therapist holding blocks in palm and pt able to complete gross grasp to pick up one at a time from OT palm of hand, minimal hand opening/closing 04/02/22: Right: 16 blocks  SENSATION: Light touch: WFL Stereognosis: Impaired  Hot/Cold: Impaired  Proprioception: Impaired   COGNITION: Overall cognitive status: Within functional limits for tasks assessed  VISION: Subjective report: Pt reports that her R eye does not close as well and that she feels that her L eye is compensating  for her R eye. Baseline vision: Wears glasses all the time  VISION ASSESSMENT: To be further assessed in functional context  --------------------------------------------------------------------------------------------------------------------------------------------------------------------- (objective measures above completed at initial evaluation unless otherwise dated)  TODAY'S TREATMENT:  04/16/22 Retrograde massage: engaged in education on use of massage for spasticity and pain s/p CVA.  Pt receptive to education on usage of massage both during therapy sessions and possibly pursuing massage therapy.  OT educated with demonstration and verbal cues on retrograde massage and use of circular motion with UE elevated for edema management as well.  Pt able to return demonstration and verbalize understanding. Nerve glides: completed median, ulnar, and radial nerve glides in standing with OT providing verbal cues and demonstration.  Pt reporting pain with ulnar nerve glides, especially when attempting to achieve internal rotation position.   BUE: engaged in folding laundry and placing on counter to R to facilitate increased usage of RUE  during functional tasks.  Pt utilizing RUE 75% of task, frequently resuming just usage of LUE especially with increased challenge to attention (engaging in discussion).    04/14/22 Supine exercises: engaged in snow angel slides along mat, "open book" chest stretch, single RUE shoulder flexion to 90*.  Pt with reports of increased pain in RUE with all movements against gravity.  Therefore transitioned to sidelying. Sidelying exercises: towel glides on table top at neutral, progressing to 45* angle for increased shoulder flexion.  Pt with on c/o pain in this position.  Attempted shoulder abduction in sidelying, however pt with increased pain therefore terminated task.   Coordination: Engaged in placing large grip pegs into peg board.  Pt demonstrating improved precision grasp and ability to manipulate pegs.  OT providing min cues for technique and to not utilize shoulder hike for compensatory movements. 9 hole peg test: Left: 29.97 sec Right: able to place 2 pegs in 2 min time limit, utilizing gross grasp, scooping peg from container instead of precision pinch, and shoulder internal rotation and compensatory hike and lateral lean.     04/09/22 Driving: Pt asking about return to driving.  She states that on a medical form that her MD had stated driving, but wanted to make sure that she could resume driving.  OT encouraged pt to reach out to MD to confirm no restrictions with resuming driving.  OT also discussed typical resumption of driving recommendations with pt reporting understanding.   Coordination: engaged in stacking activity with focus on precision pinch to pick up various sized objects.  Pt unable to completely participate in stacking aspect of activity due to decreased coordination and motor control, however demonstrating improved control with picking up items and then when removing from stack with RUE. WB: engaged in WB through [place] in standing while reaching outside BOS and across midline with  LUE to further facilitate WB.  OT educated on functional tasks in which she can incorporate WB to continue to focus on NMR.    PATIENT EDUCATION: Education details: Spasticity onset, common effects, and treatment strategies, nerve glides, functional reach and grasp Person educated: Patient Education method: Explanation Education comprehension: verbalized understanding and needs further education   HOME EXERCISE PROGRAM: Fine motor coordination handout  Access Code: [email or web address] URL: [email or web address] Date: 03/20/2022 Prepared by: [facility]  Exercises - Seated Shoulder Shrug  - 1 sets - 10 reps - Seated Scapular Retraction  - 1 sets - 10 reps - Seated Shoulder Rolls  - 1 sets - 10 reps - Seated Shoulder  Flexion Towel Slide at Table Top Full Range of Motion  - 1 sets - 10 reps  Access Code: [email or web address] URL: [email or web address] Date: 03/28/2022 Prepared by: [facility]  Exercises - Standing Median Nerve Glide  - 2-3 x daily - 1 sets - 10 reps - Standing Ulnar Nerve Glide  - 2-3 x daily - 1 sets - 10 reps - Standing Radial Nerve Glide  - 2-3 x daily - 1 sets - 10 reps - Median Nerve Glide  - 2-3 x daily - 1 sets - 10 reps - Seated Digit Tendon Gliding  - 2-3 x daily - 1 sets - 10 reps - Open Book Chest Stretch on Towel Roll  - 1 x daily - 2 sets - 10 reps - Snow Angels  - 1 x daily - 2 sets - 10 reps - Supine Shoulder Press with Dowel  - 1 x daily - 2 sets - 10 reps - Supine Shoulder Flexion with Dowel to 90  - 1 x daily - 2 sets - 10 reps    GOALS: Goals reviewed with patient? Yes  SHORT TERM GOALS: Target date: 03/28/22  Pt will be independent with HEP for fine and gross motor control. Baseline: Goal status: MET - 03/28/22  2.  Pt will demonstrate improved UE functional use for ADLs as evidenced by increasing functional use of RUE to pick up blocks to complete Box and  Blocks assessment in standardized manner. Baseline:  Goal status: MET - moved 16 blocks with R on 04/02/22  3.  Pt will verbalize understanding with safety considerations and AE needs to ensure safety d/t decreased sensation. Baseline:  Goal status: MET - 03/28/22   LONG TERM GOALS: Target date: 04/25/22  Pt and spouse will verbalize understanding for return to driving recommendations and pursue driving eval PRN. Baseline:  Goal status: MET - 04/14/22  2.  Pt will demonstrate improved UE functional use for ADLs as evidenced by increasing box/ blocks score by 10 blocks with RUE Baseline: unable to move any from box, however able to move 10 with gross grasp from blocks placed in OT palm Goal status: IN PROGRESS  3.  Pt will demonstrate increased functional reach and grasp to obtain light weight item from moderate height shelf. Baseline:  Goal status: IN PROGRESS  4.  Pt will demonstrate increased hand function and grasp to open containers (jars, medications) and verbalize understanding of AE PRN to increase ease, safety, and independence with opening containers. Baseline:  Goal status: IN PROGRESS  5.  Pt will demonstrate use of BUE together to engage in functional IADL tasks (simple meal prep, folding laundry, etc). Baseline:  Goal status: IN PROGRESS   ASSESSMENT:  CLINICAL IMPRESSION: Treatment session with focus on movement through pain free range, education on massage, and reiterating education on spasticity effects and treatment. Pt reporting pain in RUE, especially during ulnar nerve glides with internal rotation. Pt receptive to ongoing education about spasticity and treatment options for spasticity.  Pt demonstrating decreased usage of RUE with increased challenge to attention.  PERFORMANCE DEFICITS in functional skills including ADLs, IADLs, coordination, dexterity, sensation, ROM, strength, FMC, GMC, balance, body mechanics, decreased knowledge of use of DME, and UE  functional use and psychosocial skills including environmental adaptation, habits, and routines and behaviors.   IMPAIRMENTS are limiting patient from ADLs, IADLs, and work.   COMORBIDITIES may have co-morbidities  that affects occupational performance. Patient will benefit from skilled OT to  address above impairments and improve overall function.  MODIFICATION OR ASSISTANCE TO COMPLETE EVALUATION: Min-Moderate modification of tasks or assist with assess necessary to complete an evaluation.  OT OCCUPATIONAL PROFILE AND HISTORY: Detailed assessment: Review of records and additional review of physical, cognitive, psychosocial history related to current functional performance.  CLINICAL DECISION MAKING: Moderate - several treatment options, min-mod task modification necessary  REHAB POTENTIAL: Good  EVALUATION COMPLEXITY: Moderate    PLAN: OT FREQUENCY: 2x/week  OT DURATION: 8 weeks  PLANNED INTERVENTIONS: self care/ADL training, therapeutic exercise, therapeutic activity, neuromuscular re-education, manual therapy, passive range of motion, balance training, functional mobility training, splinting, electrical stimulation, ultrasound, compression bandaging, moist heat, cryotherapy, patient/family education, psychosocial skills training, energy conservation, coping strategies training, and DME and/or AE instructions  RECOMMENDED OTHER SERVICES: N/A  CONSULTED AND AGREED WITH PLAN OF CARE: Patient and family member/caregiver  PLAN FOR NEXT SESSION: Review and begin with supine there ex and nerve glides as pain in RUE persists.  Engage in wrist ROM and hand opening/closing and attempt finger isolation exercises/activities. Gross grasp and reach activities.  Progress to increased strengthening as able.  Massage (as appropriate) and tone management.   Simonne Come, OTR/L 04/16/2022, 11:55 AM
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
## 2022-04-16 NOTE — Telephone Encounter (Signed)
Called and spoke with husband Chloe Johnson (on [place]). He understands the referral to hematology.   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   She is already on warfarin Will defer to Dr. Jomarie Longs as to any additional work up.   I would be happy to refer her to a hematologist for any additional questions / concerns

## 2022-04-21 ENCOUNTER — Ambulatory Visit: Payer: Federal, State, Local not specified - PPO | Admitting: Occupational Therapy

## 2022-04-21 ENCOUNTER — Ambulatory Visit: Payer: Federal, State, Local not specified - PPO

## 2022-04-21 DIAGNOSIS — R482 Apraxia: Secondary | ICD-10-CM

## 2022-04-21 DIAGNOSIS — M6281 Muscle weakness (generalized): Secondary | ICD-10-CM | POA: Diagnosis not present

## 2022-04-21 DIAGNOSIS — I69351 Hemiplegia and hemiparesis following cerebral infarction affecting right dominant side: Secondary | ICD-10-CM | POA: Diagnosis not present

## 2022-04-21 DIAGNOSIS — R278 Other lack of coordination: Secondary | ICD-10-CM | POA: Diagnosis not present

## 2022-04-21 DIAGNOSIS — R4701 Aphasia: Secondary | ICD-10-CM | POA: Diagnosis not present

## 2022-04-21 DIAGNOSIS — R471 Dysarthria and anarthria: Secondary | ICD-10-CM

## 2022-04-21 DIAGNOSIS — R208 Other disturbances of skin sensation: Secondary | ICD-10-CM | POA: Diagnosis not present
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
## 2022-04-21 NOTE — Therapy (Signed)
OUTPATIENT OCCUPATIONAL THERAPY  Treatment Note  Patient Name: Chloe Johnson MRN: [id number] DOB:[birth year], 66 y.o., female Today's Date: 04/21/2022  PCP: Charlane Ferretti, MD REFERRING PROVIDER: Izora Ribas, MD    OT End of Session - 04/21/22 1205     Visit Number 12    Number of Visits 17    Date for OT Re-Evaluation 04/25/22    Authorization Type [employer]    OT Start Time 1152    OT Stop Time 1232    OT Time Calculation (min) 40 min                     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/16/2013   L3-4/notes 11/24/2013, arms, neck   Exertional asthma    GERD (gastroesophageal reflux disease)    Hypertension    Kidney stones    "have always passed them"   Neck pain    Osteomyelitis ([place]) 11/23/2013   osteomyelitis, discitis    Sleep concern    uses Prozac for sleep    Past Surgical History:  Procedure Laterality Date   BREAST CYST EXCISION Left 2010   "polypectomy"   IR CT HEAD LTD  01/21/2022   IR CT HEAD LTD  01/28/2022   IR PERCUTANEOUS ART THROMBECTOMY/INFUSION INTRACRANIAL INC DIAG ANGIO  01/21/2022   IR PERCUTANEOUS ART THROMBECTOMY/INFUSION INTRACRANIAL INC DIAG ANGIO  01/28/2022   IR US GUIDE VASC ACCESS RIGHT  01/22/2022   PICC LINE PLACE PERIPHERAL ([place] [facility]) Right    for use of Levaquin & Vancomycin, of note: she reports that she had a "flulike feeling"    RADIOLOGY WITH ANESTHESIA N/A 01/21/2022   Procedure: IR WITH ANESTHESIA;  Surgeon: Luanne Bras, MD;  Location: [facility];  Service: Radiology;  Laterality: N/A;   RADIOLOGY WITH ANESTHESIA N/A 01/28/2022   Procedure: IR WITH ANESTHESIA;  Surgeon: Radiologist, Medication, MD;  Location: [facility];  Service: Radiology;  Laterality: N/A;   [place]   Patient Active Problem List   Diagnosis Date Noted   Aphasia 03/31/2022   Snores 03/31/2022   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   UTI  (urinary tract infection) 02/04/2022   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   Stroke determined by clinical assessment ([place]) 01/28/2022   Middle cerebral artery embolism, left 01/28/2022   H/O ischemic left MCA stroke    Acute respiratory failure (HCC)    CVA (cerebral vascular accident) ([place]) 01/21/2022   NSTEMI (non-ST elevated myocardial infarction) ([place]) 01/21/2022   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   HFrEF (heart failure with reduced ejection fraction) ([employer]) 01/21/2022   Hyperlipidemia 01/21/2022   Lumbar stenosis 01/21/2022   Stroke (cerebrum) ([place]) 01/21/2022   S/P lumbar spinal fusion 11/15/2014   Lumbar discitis 11/29/2013   Discitis 11/24/2013   Diarrhea 11/17/2013   Osteomyelitis of lumbar spine ([place]) 11/16/2013   Hypertension    Asthma    Anxiety     ONSET DATE: 01/28/22  REFERRING DIAG: I63.9 (ICD-10-CM) - Cerebral infarction, unspecified   THERAPY DIAG:  Hemiplegia and hemiparesis following cerebral infarction affecting right dominant side (HCC)  Muscle weakness (generalized)  Other lack of coordination  Other disturbances of skin sensation  Rationale for Evaluation and Treatment Rehabilitation  SUBJECTIVE:   SUBJECTIVE STATEMENT: Pt showing OT her MRI results and reports that she will have a f/u  with Dr. Ranell Patrick tomorrow.  Pt asking questions about possibility of surgery. Pt accompanied by: self  PERTINENT HISTORY: PMH: HTN, cervical radiculopathy, hepatitis, osteomyelitis/discitis, lumbar stenosis with foot drop anxiety, GERD, insomnia  PRECAUTIONS: Fall  WEIGHT BEARING RESTRICTIONS  was awaiting spinal fusion surgery, limit bending, twisting, lifting >10#  PAIN:  Are you having pain? Yes: NPRS scale: 3/10 Pain location: R arm, all over Pain description: sharp, aching Aggravating factors: certain movements Relieving factors: pain meds  FALLS: Has patient fallen in last 6 months? Yes. Number of falls 2  LIVING  ENVIRONMENT: Lives with: lives with their spouse Lives in: House/apartment Stairs: Yes: Internal: full flights of steps; on right going up and External: 7 steps; on left going up Has following equipment at home: Single point cane, Walker - 2 wheeled, Walker - 4 wheeled, shower chair, and bed side commode  PLOF: Independent  PATIENT GOALS I want my arm back.    OBJECTIVE:   HAND DOMINANCE: Right  ADLs: Transfers/ambulation related to ADLs: Mod I with no use of  Eating: Husband assists with cutting, feeding self with L hand Grooming: utilizing L hand with opening and brushing teeth UB Dressing: unable to manage buttons, otherwise Mod I LB Dressing: unable to tie shoes, just slipping on toes Toileting: Mod I with use of L hand Bathing: Mod I, utilizing L hand to shave Tub Shower transfers: Mod I Equipment: Shower seat with back, Walk in shower, and bed side commode   IADLs: Light housekeeping: laundry and hanging up clothes, wipes counters/sink, needs assist to fold clothing Meal Prep: no, did not cook pre-stroke Medication management: husband opens pill bottles Handwriting: unable  MOBILITY STATUS: Needs Assist: Supervision - Mod I without use of AD  POSTURE COMMENTS:  No Significant postural limitations  ACTIVITY TOLERANCE: Activity tolerance: WNL for tasks assessed on eval  FUNCTIONAL OUTCOME MEASURES: FOTO: 49  UPPER EXTREMITY ROM     Active ROM Right eval Left eval  Shoulder flexion WNL WNL  Shoulder abduction    Shoulder adduction    Shoulder extension    Shoulder internal rotation    Shoulder external rotation    Elbow flexion    Elbow extension -5 WNL  Wrist flexion 38 WNL  Wrist extension 50 WNL  Wrist ulnar deviation    Wrist radial deviation    Wrist pronation    Wrist supination    (Blank rows = not tested)   UPPER EXTREMITY MMT:     MMT Right eval Left eval  Shoulder flexion 4+/5   Shoulder abduction    Shoulder adduction    Shoulder  extension    Shoulder internal rotation    Shoulder external rotation    Middle trapezius    Lower trapezius    Elbow flexion 4+/5   Elbow extension 4+/5   Wrist flexion    Wrist extension    Wrist ulnar deviation    Wrist radial deviation    Wrist pronation    Wrist supination    (Blank rows = not tested)  HAND FUNCTION: Loose gross grasp, unable to squeeze dynamometer  COORDINATION: Box and Blocks:  Right 10 blocks, Left 53 blocks Modified performance with RUE with therapist holding blocks in palm and pt able to complete gross grasp to pick up one at a time from OT palm of hand, minimal hand opening/closing 04/02/22: Right: 16 blocks  SENSATION: Light touch: WFL Stereognosis: Impaired  Hot/Cold: Impaired  Proprioception: Impaired   COGNITION: Overall cognitive status: Within functional  limits for tasks assessed  VISION: Subjective report: Pt reports that her R eye does not close as well and that she feels that her L eye is compensating for her R eye. Baseline vision: Wears glasses all the time  VISION ASSESSMENT: To be further assessed in functional context  --------------------------------------------------------------------------------------------------------------------------------------------------------------------- (objective measures above completed at initial evaluation unless otherwise dated)  TODAY'S TREATMENT:  04/21/22 Discussion about MRI results and upcoming appt with Dr. Ranell Patrick.  Discussed concerns of therapy progress if MD were to recommend surgery.  Discussed typical surgical protocol, limitations with recommendation of surgery post CVA due to on blood thinners, as well as typical recovery timeline post CVA. Coordination: picking up and stacking 1" blocks, incorporating precision pinch, wrist flexion, and coordination to stack up to 5 blocks.  Pt then unstacking with good control and pinch.  Attempted rotating large dice in finger tips or even pinch and  rotate on table top, however due to decreased wrist flexion and decreased grasp and control pt unable to complete.  Transitioned to picking up smaller checker pieces and translating into palm.  Pt able to do with mod difficulty and incorporation of shoulder hike when attempting to translate checker pieces into palm.  Pt demonstrating decreased opening of grasp to release checkers once in palm of hand.  OT challenged pt to [employer]. Pt with ability to complete 3 with mod difficulty before reporting hand fatigue. Isolated finger movements: Engaged in abduction/adduction, finger extension, and thumb opposition.  Pt continues to have difficulty with adduction, ultimately requiring flexion into gross fist to bring fingers back together.  Pt completing hand over hand to facilitate increased success with finger extension.  With thumb opposition, pt able to oppose to index and long finger, but unable to progress to ring finger.   04/16/22 Retrograde massage: engaged in education on use of massage for spasticity and pain s/p CVA.  Pt receptive to education on usage of massage both during therapy sessions and possibly pursuing massage therapy.  OT educated with demonstration and verbal cues on retrograde massage and use of circular motion with UE elevated for edema management as well.  Pt able to return demonstration and verbalize understanding. Nerve glides: completed median, ulnar, and radial nerve glides in standing with OT providing verbal cues and demonstration.  Pt reporting pain with ulnar nerve glides, especially when attempting to achieve internal rotation position.   BUE: engaged in folding laundry and placing on counter to R to facilitate increased usage of RUE during functional tasks.  Pt utilizing RUE 75% of task, frequently resuming just usage of LUE especially with increased challenge to attention (engaging in discussion).    04/14/22 Supine exercises: engaged in snow angel slides along  mat, "open book" chest stretch, single RUE shoulder flexion to 90*.  Pt with reports of increased pain in RUE with all movements against gravity.  Therefore transitioned to sidelying. Sidelying exercises: towel glides on table top at neutral, progressing to 45* angle for increased shoulder flexion.  Pt with on c/o pain in this position.  Attempted shoulder abduction in sidelying, however pt with increased pain therefore terminated task.   Coordination: Engaged in placing large grip pegs into peg board.  Pt demonstrating improved precision grasp and ability to manipulate pegs.  OT providing min cues for technique and to not utilize shoulder hike for compensatory movements. 9 hole peg test: Left: 29.97 sec Right: able to place 2 pegs in 2 min time limit, utilizing gross grasp, scooping peg from  container instead of precision pinch, and shoulder internal rotation and compensatory hike and lateral lean.     PATIENT EDUCATION: Education details: Continued focus on coordination and isolated finger exercises.  Discussed typical CVA recovery timeline. Person educated: Patient Education method: Explanation Education comprehension: verbalized understanding and needs further education   HOME EXERCISE PROGRAM: Fine motor coordination handout  Access Code: [email or web address] URL: [email or web address] Date: 03/20/2022 Prepared by: [facility]  Exercises - Seated Shoulder Shrug  - 1 sets - 10 reps - Seated Scapular Retraction  - 1 sets - 10 reps - Seated Shoulder Rolls  - 1 sets - 10 reps - Seated Shoulder Flexion Towel Slide at Table Top Full Range of Motion  - 1 sets - 10 reps  Access Code: [email or web address] URL: [email or web address] Date: 03/28/2022 Prepared by: [facility]  Exercises - Standing Median Nerve Glide  - 2-3 x daily - 1 sets - 10 reps - Standing Ulnar Nerve Glide  - 2-3 x daily - 1 sets - 10  reps - Standing Radial Nerve Glide  - 2-3 x daily - 1 sets - 10 reps - Median Nerve Glide  - 2-3 x daily - 1 sets - 10 reps - Seated Digit Tendon Gliding  - 2-3 x daily - 1 sets - 10 reps - Open Book Chest Stretch on Towel Roll  - 1 x daily - 2 sets - 10 reps - Snow Angels  - 1 x daily - 2 sets - 10 reps - Supine Shoulder Press with Dowel  - 1 x daily - 2 sets - 10 reps - Supine Shoulder Flexion with Dowel to 90  - 1 x daily - 2 sets - 10 reps    GOALS: Goals reviewed with patient? Yes  SHORT TERM GOALS: Target date: 03/28/22  Pt will be independent with HEP for fine and gross motor control. Baseline: Goal status: MET - 03/28/22  2.  Pt will demonstrate improved UE functional use for ADLs as evidenced by increasing functional use of RUE to pick up blocks to complete Box and Blocks assessment in standardized manner. Baseline:  Goal status: MET - moved 16 blocks with R on 04/02/22  3.  Pt will verbalize understanding with safety considerations and AE needs to ensure safety d/t decreased sensation. Baseline:  Goal status: MET - 03/28/22   LONG TERM GOALS: Target date: 04/25/22  Pt and spouse will verbalize understanding for return to driving recommendations and pursue driving eval PRN. Baseline:  Goal status: MET - 04/14/22  2.  Pt will demonstrate improved UE functional use for ADLs as evidenced by increasing box/ blocks score by 10 blocks with RUE Baseline: unable to move any from box, however able to move 10 with gross grasp from blocks placed in OT palm Goal status: IN PROGRESS  3.  Pt will demonstrate increased functional reach and grasp to obtain light weight item from moderate height shelf. Baseline:  Goal status: IN PROGRESS  4.  Pt will demonstrate increased hand function and grasp to open containers (jars, medications) and verbalize understanding of AE PRN to increase ease, safety, and independence with opening containers. Baseline:  Goal status: IN PROGRESS  5.  Pt  will demonstrate use of BUE together to engage in functional IADL tasks (simple meal prep, folding laundry, etc). Baseline:  Goal status: IN PROGRESS   ASSESSMENT:  CLINICAL IMPRESSION: Treatment session with focus on movement through pain free range  with primary focus on coordination this session.  Pt asking questions about MRI results and possibility of recommendation for surgery and impact on CVA recovery.  Pt with no c/o pain with table top activities, incorporating min shoulder flexion for stacking.  Pt limited with coordination and finger exercises due to tone and edema in forearm and hand.  PERFORMANCE DEFICITS in functional skills including ADLs, IADLs, coordination, dexterity, sensation, ROM, strength, FMC, GMC, balance, body mechanics, decreased knowledge of use of DME, and UE functional use and psychosocial skills including environmental adaptation, habits, and routines and behaviors.   IMPAIRMENTS are limiting patient from ADLs, IADLs, and work.   COMORBIDITIES may have co-morbidities  that affects occupational performance. Patient will benefit from skilled OT to address above impairments and improve overall function.  MODIFICATION OR ASSISTANCE TO COMPLETE EVALUATION: Min-Moderate modification of tasks or assist with assess necessary to complete an evaluation.  OT OCCUPATIONAL PROFILE AND HISTORY: Detailed assessment: Review of records and additional review of physical, cognitive, psychosocial history related to current functional performance.  CLINICAL DECISION MAKING: Moderate - several treatment options, min-mod task modification necessary  REHAB POTENTIAL: Good  EVALUATION COMPLEXITY: Moderate    PLAN: OT FREQUENCY: 2x/week  OT DURATION: 8 weeks  PLANNED INTERVENTIONS: self care/ADL training, therapeutic exercise, therapeutic activity, neuromuscular re-education, manual therapy, passive range of motion, balance training, functional mobility training, splinting,  electrical stimulation, ultrasound, compression bandaging, moist heat, cryotherapy, patient/family education, psychosocial skills training, energy conservation, coping strategies training, and DME and/or AE instructions  RECOMMENDED OTHER SERVICES: N/A  CONSULTED AND AGREED WITH PLAN OF CARE: Patient and family member/caregiver  PLAN FOR NEXT SESSION: Await MD f/u about MRI results in regards to shoulder ROM.  Engage in wrist ROM and hand opening/closing and attempt finger isolation exercises/activities. Gross grasp and reach activities.  Progress to increased strengthening as able.  Massage (as appropriate) and tone management.   Simonne Come, OTR/L 04/21/2022, 12:06 PM

## 2022-04-22 ENCOUNTER — Ambulatory Visit: Payer: Federal, State, Local not specified - PPO | Attending: Cardiovascular Disease

## 2022-04-22 ENCOUNTER — Encounter (HOSPITAL_BASED_OUTPATIENT_CLINIC_OR_DEPARTMENT_OTHER): Payer: Federal, State, Local not specified - PPO | Admitting: Physical Medicine and Rehabilitation

## 2022-04-22 DIAGNOSIS — S46011A Strain of muscle(s) and tendon(s) of the rotator cuff of right shoulder, initial encounter: Secondary | ICD-10-CM | POA: Diagnosis not present

## 2022-04-22 DIAGNOSIS — I513 Intracardiac thrombosis, not elsewhere classified: Secondary | ICD-10-CM

## 2022-04-22 DIAGNOSIS — Z5181 Encounter for therapeutic drug level monitoring: Secondary | ICD-10-CM | POA: Diagnosis not present

## 2022-04-22 DIAGNOSIS — I639 Cerebral infarction, unspecified: Secondary | ICD-10-CM

## 2022-04-22 LAB — POCT INR: INR: 4.5 — AB (ref 2.0–3.0)

## 2022-04-22 NOTE — Patient Instructions (Signed)
HOLD TODAY ONLY then DECREASE TO 1 tablet daily. Continue green leafy veggie to weekly. Recheck INR in 2 weeks.  [facility] [phone]

## 2022-04-23 ENCOUNTER — Ambulatory Visit: Payer: Federal, State, Local not specified - PPO

## 2022-04-23 ENCOUNTER — Ambulatory Visit: Payer: Federal, State, Local not specified - PPO | Admitting: Occupational Therapy

## 2022-04-23 DIAGNOSIS — I69351 Hemiplegia and hemiparesis following cerebral infarction affecting right dominant side: Secondary | ICD-10-CM | POA: Diagnosis not present

## 2022-04-23 DIAGNOSIS — R208 Other disturbances of skin sensation: Secondary | ICD-10-CM

## 2022-04-23 DIAGNOSIS — R482 Apraxia: Secondary | ICD-10-CM

## 2022-04-23 DIAGNOSIS — R4701 Aphasia: Secondary | ICD-10-CM

## 2022-04-23 DIAGNOSIS — M6281 Muscle weakness (generalized): Secondary | ICD-10-CM | POA: Diagnosis not present

## 2022-04-23 DIAGNOSIS — R471 Dysarthria and anarthria: Secondary | ICD-10-CM

## 2022-04-23 DIAGNOSIS — R278 Other lack of coordination: Secondary | ICD-10-CM
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
## 2022-04-23 NOTE — Progress Notes (Signed)
Subjective:    Patient ID: Chloe Johnson, female    DOB: [birth year], 66 y.o.   MRN: [id number]  HPI:  An audio/video tele-health visit is felt to be the most appropriate encounter for this patient at this time. This is a follow up tele-visit via phone. The patient is at home. MD is at office. Prior to scheduling this appointment, our staff discussed the limitations of evaluation and management by telemedicine and the availability of in-person appointments. The patient expressed understanding and agreed to proceed.   Chloe Johnson is a 66 y.o. female who is here f/u of right shoulder pain, Left Middle Cerebral Artery Stroke, Left Ventricular Thrombus and Essential Hypertension.   Dr. Iver Nestle H&P on 01/28/2022 HPI: Chloe Johnson is a 66 y.o. 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 Amada Jupiter, with key details confirmed by husband Chloe Johnson  is a 34 y.o.  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 She presents today after the following series events.  Around 12 PM this past Saturday, the patient noted sudden onset dizziness described as "vertigo" followed by nausea then followed by an episode of vomiting.  She next felt a sudden onset sharp chest pain with radiation to her back.  She assumed it was her cervical radiculopathy "acting up" because shortly after this event, she was holding 2 plates in her hand to feed her cats when she  suddenly had no control of both upper extremities and dropped said plates.  On Sunday, she felt generally weak but then improved from all of this until this morning, around 9 AM, when she experienced word finding difficulties.  Her husband, who is at bedside, also notes she had a right facial droop transiently that lasted no more than a few minutes.  She may have had 2 subsequent episodes, as documented by ED physician; however, all of her is told by the patient's husband that this was 1 single episode."   Her hospital course was noted for acute worsening for which she underwent thrombectomy and was discharged with only some very mild residual aphasia not detectable on examination but noticed by the patient per husband.  He notes that she has been in her normal state of health since discharge other than a visit to the ED on 9/3 with left shoulder pain which was attributed to her known cervical stenosis for which she is planned for surgery in the near future   Husband reports that she was taking his blood pressure when suddenly the blood pressure monitor fell off the table.  She tried to catch it but lost her balance had weakness in both of her legs.  Initially she got to both both arms but then became weak on the right side.  She was unable to speak at all suddenly during this event.  Work-up revealed a left ventricle apical thrombus.   Has been reports she has not missed any of her doses of medication including Eliquis   CTA was attempted to confirm recurrent acute occlusion, however due to IV infiltration and severity of her symptoms  decision was made to proceed directly to IR which was discussed both with [name] as well as with the patient's husband   MR Brain:  IMPRESSION: 1. 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 2. No intracranial large vessel occlusion or significant stenosis. In particular, left MCA branches appear perfused and without  focal stenosis.  MRA:  IMPRESSION: 1. 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 2. No intracranial large vessel occlusion or significant stenosis. In particular, left MCA branches appear perfused and without focal stenosis.  [name] COURSE Stroke:  left MCA punctate scattered infarct with left M2 occlusion s/p IR with TICI2c, embolic secondary to LV thrombus recently on Eliquis CT no acute abnormality IR left superior M2 occlusion, inferior M3/M4 nonocclusive thrombus Status post EVT with TICI2c MRI left insular cortex and frontal lobe punctate infarcts MRA unremarkable 2D Echo EF 45 to 50% on 8/29, LV regional motion abnormality with LV thrombus LDL 150 on 8/29 HgbA1c 5 0 on 8/29 UDS positive for THC Hypercoagulable and autoimmune work up negative Heparin IV for VTE prophylaxis aspirin 81 mg daily and Eliquis (apixaban) daily prior to admission,  now on coumadin with lovenox bridge. INR goal 2.5 Continue ASA 81 per cardiology. Ongoing aggressive stroke risk factor management Therapy recommendations:  CIR Disposition:  pending   Hx of stroke and LV thrombus 8/29 admitted for nausea vomiting dizziness chest pain and then developed right facial droop and aphasia.  Found to have troponin > 300, EF 45 to 50% with LV thrombus.  CTA coronary artery showed minimal CAD.  CT no acute abnormality.  CTA head and neck showed left M2 occlusion.  CTP 0/7.  Status post EVT with TICI2c.  MRI showed left frontal Nachtigal matter punctate infarct.  Repeat MRI showed left external capsule, insular cortex and frontal cortex punctate infarcts.  LDL 150, A1c 5.0.  UDS positive for THC.  Patient was put on heparin IV and aspirin and eventually discharged on Eliquis and aspirin as well as Crestor 20. Per family, patient has slight expressive aphasia since discharge, no physical deficit. Cardiology [name] on board, recommend coumadin with INR goal 2.5. on  lovenox bridge.    Chloe Johnson was admitted to inpatient rehabilitation on 02/04/2022 and discharged home on 02/07/2022. She will begin outpatient Therapy on 02/25/2022 at [facility].  Chloe Johnson has expressive aphasia.  She was positive with THC, she reports she uses CBD products for anxiety, this was discussed with [name]. [name] stated she can continue with her CBD product, she verbalizes understanding.   1) Right shoulder pain  -started suddenly when she was doing an activity (history is limited by her aphasia) -she feels this pain is more than just spasticity -it is very severe -she asks whether she should increase her Lyrica 200mg  BIS -XR discussed and she would like to get this -continues to be excruciating and she is interested in surgical eval  Pain Inventory Average Pain 4 Pain Right Now 4 My pain is constant and aching  LOCATION OF PAIN  Neck,Wrist,Hand,Back  BOWEL Number of stools per week: 14  Type of laxative Super Cleanse  BLADDER Normal    Mobility walk without assistance how many minutes can you walk? As needed ability to climb steps?  yes do you drive?  no Do you have any goals in this area?  yes  Function employed # of hrs/week 70  Neuro/Psych bowel  control problems weakness numbness anxiety  Prior Studies Any changes since last visit?  no  Physicians involved in your care Any changes since last visit?  no   Family History  Problem Relation Age of Onset   Other Mother    Other Father    Hypertension Father    Social History   Socioeconomic History   Marital status: Married    Spouse name: Leonette Most   Number of children: 3   Years of education: RN   Highest education level: Not on file  Occupational History    Employer: [employer]   Tobacco Use   Smoking status: Never   Smokeless tobacco: Never  Vaping Use   Vaping Use: Never used  Substance and Sexual Activity   Alcohol use: Yes     Alcohol/week: 6.0 standard drinks of alcohol    Types: 3 Glasses of wine, 3 Shots of liquor per week   Drug use: No   Sexual activity: Yes    Birth control/protection: Post-menopausal  Other Topics Concern   Not on file  Social History Narrative   Patient is married and lives at home with her husband Leonette Most).  Patient works full time as a Char[employer] fundraiser.   Right handed.   College education.   Social Determinants of Health   Financial Resource Strain: Not on file  Food Insecurity: Not on file  Transportation Needs: Not on file  Physical Activity: Not on file  Stress: Not on file  Social Connections: Not on file   Past Surgical History:  Procedure Laterality Date   BREAST CYST EXCISION Left 2010   "polypectomy"   IR CT HEAD LTD  01/21/2022   IR CT HEAD LTD  01/28/2022   IR PERCUTANEOUS ART THROMBECTOMY/INFUSION INTRACRANIAL INC DIAG ANGIO  01/21/2022   IR PERCUTANEOUS ART THROMBECTOMY/INFUSION INTRACRANIAL INC DIAG ANGIO  01/28/2022   IR US GUIDE VASC ACCESS RIGHT  01/22/2022   PICC LINE PLACE PERIPHERAL ([facility] HX) Right    for use of Levaquin & Vancomycin, of note: she reports that she had a "flulike feeling"    RADIOLOGY WITH ANESTHESIA N/A 01/21/2022   Procedure: IR WITH ANESTHESIA;  Surgeon: Julieanne Cotton, MD;  Location: [facility] OR;  Service: Radiology;  Laterality: N/A;   RADIOLOGY WITH ANESTHESIA N/A 01/28/2022   Procedure: IR WITH ANESTHESIA;  Surgeon: Radiologist, Medication, MD;  Location: [facility] OR;  Service: Radiology;  Laterality: N/A;   TONSILLECTOMY AND ADENOIDECTOMY  1975   TUBAL LIGATION  1999   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/16/2013   L3-4/notes 11/24/2013, arms, neck   Exertional asthma    GERD (gastroesophageal reflux disease)    Hypertension    Kidney stones    "have always passed them"   Neck pain    Osteomyelitis (HCC) 11/23/2013   osteomyelitis, discitis    Sleep concern    uses Prozac for sleep    There  were no vitals taken for this visit.  Opioid Risk Score:   Fall Risk Score:  `1  Depression screen [facility] 2/9     02/20/2022    9:22 AM 02/15/2014   11:06 AM 01/18/2014    2:15 PM 12/23/2013   10:14 AM  Depression screen PHQ 2/9  Decreased Interest 0 0 0 0  Down, Depressed, Hopeless 0 1 0 0  PHQ - 2 Score 0 1 0 0  Altered sleeping 0  Tired, decreased energy 1     Change in appetite 0     Feeling bad or failure about yourself  0     Trouble concentrating 0     Moving slowly or fidgety/restless 0     Suicidal thoughts 0     PHQ-9 Score 1     Difficult doing work/chores Not difficult at all         Review of Systems  Musculoskeletal:  Positive for back pain and neck pain.       Wrist and hand pain   All other systems reviewed and are negative.      Objective:   Not performed       Assessment & Plan:  Left Middle Cerebral Artery Stroke: Continue current medication regimen. Has a scheduled appointment with Neurology. She will begin outpatient therapy at [facility]. Continue to Monitor.  Left Ventricular Thrombus: Continue current medication regimen. Cardiology Following.  Essential Hypertension. Continue current medication regimen. PCP Following. Continue to Monitor.  Right shoulder pain: Discussed that MRI shows complete tear, referred to ortho Discussed extracorporeal shockwave therapy as a modality for treatment. Discussed that the device looks and feels like a massage gun and I would move it over the area of pain for about 10 minutes. The device releases sound waves to the area of pain and helps to improve blood flow and circulation to improve the healing process. Discuss that this initially induces inflammation and can sometimes cause short-term increase in pain. Discussed that we typically do three weekly treatments, but sometimes up to 6 if needed, and after 6 weeks long term benefits can sometimes be achieved.  Lyrica increased to 200mg  BID  9  minutes spent in discussion of her MRI results, referral to ortho, [facility] therapy, that she will likely need cardiac clearance for holding coumadin prior to any orthopedic procedure

## 2022-04-23 NOTE — Therapy (Signed)
OUTPATIENT OCCUPATIONAL THERAPY  Treatment Note  Patient Name: Chloe Johnson MRN: [id number] DOB:[birth year], 66 y.o., female Today's Date: 04/23/2022  PCP: Charlane Ferretti, MD REFERRING PROVIDER: Izora Ribas, MD    OT End of Session - 04/23/22 1241     Visit Number 13    Number of Visits 25    Date for OT Re-Evaluation 06/06/22    Authorization Type [employer]    OT Start Time 1102    OT Stop Time 1147    OT Time Calculation (min) 45 min                      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/16/2013   L3-4/notes 11/24/2013, arms, neck   Exertional asthma    GERD (gastroesophageal reflux disease)    Hypertension    Kidney stones    "have always passed them"   Neck pain    Osteomyelitis ([place]) 11/23/2013   osteomyelitis, discitis    Sleep concern    uses Prozac for sleep    Past Surgical History:  Procedure Laterality Date   BREAST CYST EXCISION Left 2010   "polypectomy"   IR CT HEAD LTD  01/21/2022   IR CT HEAD LTD  01/28/2022   IR PERCUTANEOUS ART THROMBECTOMY/INFUSION INTRACRANIAL INC DIAG ANGIO  01/21/2022   IR PERCUTANEOUS ART THROMBECTOMY/INFUSION INTRACRANIAL INC DIAG ANGIO  01/28/2022   IR US GUIDE VASC ACCESS RIGHT  01/22/2022   PICC LINE PLACE PERIPHERAL ([place] [facility]) Right    for use of Levaquin & Vancomycin, of note: she reports that she had a "flulike feeling"    RADIOLOGY WITH ANESTHESIA N/A 01/21/2022   Procedure: IR WITH ANESTHESIA;  Surgeon: Luanne Bras, MD;  Location: [facility];  Service: Radiology;  Laterality: N/A;   RADIOLOGY WITH ANESTHESIA N/A 01/28/2022   Procedure: IR WITH ANESTHESIA;  Surgeon: Radiologist, Medication, MD;  Location: [facility];  Service: Radiology;  Laterality: N/A;   [place]   Patient Active Problem List   Diagnosis Date Noted   Aphasia 03/31/2022   Snores 03/31/2022   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   UTI  (urinary tract infection) 02/04/2022   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   Stroke determined by clinical assessment ([place]) 01/28/2022   Middle cerebral artery embolism, left 01/28/2022   H/O ischemic left MCA stroke    Acute respiratory failure (HCC)    CVA (cerebral vascular accident) ([place]) 01/21/2022   NSTEMI (non-ST elevated myocardial infarction) ([place]) 01/21/2022   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   HFrEF (heart failure with reduced ejection fraction) ([place]) 01/21/2022   Hyperlipidemia 01/21/2022   Lumbar stenosis 01/21/2022   Stroke (cerebrum) ([place]) 01/21/2022   S/P lumbar spinal fusion 11/15/2014   Lumbar discitis 11/29/2013   Discitis 11/24/2013   Diarrhea 11/17/2013   Osteomyelitis of lumbar spine ([place]) 11/16/2013   Hypertension    Asthma    Anxiety     ONSET DATE: 01/28/22  REFERRING DIAG: I63.9 (ICD-10-CM) - Cerebral infarction, unspecified   THERAPY DIAG:  Hemiplegia and hemiparesis following cerebral infarction affecting right dominant side (HCC)  Muscle weakness (generalized)  Other lack of coordination  Other disturbances of skin sensation  Rationale for Evaluation and Treatment Rehabilitation  SUBJECTIVE:   SUBJECTIVE STATEMENT: Pt reports that PM&R doctor states that her injury could heal without surgery,  however still plans to have consult with Dr. Griffin Basil. Pt accompanied by: self  PERTINENT HISTORY: PMH: HTN, cervical radiculopathy, hepatitis, osteomyelitis/discitis, lumbar stenosis with foot drop anxiety, GERD, insomnia  PRECAUTIONS: Fall  WEIGHT BEARING RESTRICTIONS  was awaiting spinal fusion surgery, limit bending, twisting, lifting >10#  PAIN:  Are you having pain? Yes: NPRS scale: 3/10 Pain location: R upper arm Pain description: sharp, aching Aggravating factors: certain movements Relieving factors: pain meds  FALLS: Has patient fallen in last 6 months? Yes. Number of falls 2  LIVING ENVIRONMENT: Lives  with: lives with their spouse Lives in: House/apartment Stairs: Yes: Internal: full flights of steps; on right going up and External: 7 steps; on left going up Has following equipment at home: Single point cane, Walker - 2 wheeled, Walker - 4 wheeled, shower chair, and bed side commode  PLOF: Independent  PATIENT GOALS I want my arm back.    OBJECTIVE:   HAND DOMINANCE: Right  ADLs: Transfers/ambulation related to ADLs: Mod I with no use of  Eating: Husband assists with cutting, feeding self with L hand Grooming: utilizing L hand with opening and brushing teeth UB Dressing: unable to manage buttons, otherwise Mod I LB Dressing: unable to tie shoes, just slipping on toes Toileting: Mod I with use of L hand Bathing: Mod I, utilizing L hand to shave Tub Shower transfers: Mod I Equipment: Shower seat with back, Walk in shower, and bed side commode   IADLs: Light housekeeping: laundry and hanging up clothes, wipes counters/sink, needs assist to fold clothing Meal Prep: no, did not cook pre-stroke Medication management: husband opens pill bottles Handwriting: unable  MOBILITY STATUS: Needs Assist: Supervision - Mod I without use of AD  POSTURE COMMENTS:  No Significant postural limitations  ACTIVITY TOLERANCE: Activity tolerance: WNL for tasks assessed on eval  FUNCTIONAL OUTCOME MEASURES: FOTO: 49  UPPER EXTREMITY ROM     Active ROM Right eval Left eval  Shoulder flexion WNL WNL  Shoulder abduction    Shoulder adduction    Shoulder extension    Shoulder internal rotation    Shoulder external rotation    Elbow flexion    Elbow extension -5 WNL  Wrist flexion 38 WNL  Wrist extension 50 WNL  Wrist ulnar deviation    Wrist radial deviation    Wrist pronation    Wrist supination    (Blank rows = not tested)   UPPER EXTREMITY MMT:     MMT Right eval Left eval  Shoulder flexion 4+/5   Shoulder abduction    Shoulder adduction    Shoulder extension     Shoulder internal rotation    Shoulder external rotation    Middle trapezius    Lower trapezius    Elbow flexion 4+/5   Elbow extension 4+/5   Wrist flexion    Wrist extension    Wrist ulnar deviation    Wrist radial deviation    Wrist pronation    Wrist supination    (Blank rows = not tested)  HAND FUNCTION: Loose gross grasp, unable to squeeze dynamometer  COORDINATION: Box and Blocks:  Right 10 blocks, Left 53 blocks Modified performance with RUE with therapist holding blocks in palm and pt able to complete gross grasp to pick up one at a time from OT palm of hand, minimal hand opening/closing 04/02/22: Right: 16 blocks  SENSATION: Light touch: WFL Stereognosis: Impaired  Hot/Cold: Impaired  Proprioception: Impaired   COGNITION: Overall cognitive status: Within functional limits for tasks assessed  VISION: Subjective report: Pt reports that her R eye does not close as well and that she feels that her L eye is compensating for her R eye. Baseline vision: Wears glasses all the time  VISION ASSESSMENT: To be further assessed in functional context  --------------------------------------------------------------------------------------------------------------------------------------------------------------------- (objective measures above completed at initial evaluation unless otherwise dated)  TODAY'S TREATMENT:  04/23/22 Coordination: picking up Connect 4 pieces and placing in Connect 4 grid.  Focus on pinch and rotation of pieces, pt requiring use of table top to aid in rotation.  OT challenged pt to stack connect 4 pieces requiring wrist extension and precision pinch.  Pt with increased difficulty stacking, therefore OT modified task to having pt remove stacked pieces.  Pt demonstrating min wrist extension with attempts at stacking unstacking.   Retrograde massage: OT provided retrograde massage to RUE, primarily at thumb, index, and long finger while educating on  technique and completing with UE elevated to facilitate increased movement of edema.   Grip and isolated finger movements after massage.  Pt with improved thumb opposition against mild resistance of foam block and ability to nearly reach to ring finger this session.    04/21/22 Discussion about MRI results and upcoming appt with Dr. Ranell Patrick.  Discussed concerns of therapy progress if MD were to recommend surgery.  Discussed typical surgical protocol, limitations with recommendation of surgery post CVA due to on blood thinners, as well as typical recovery timeline post CVA. Coordination: picking up and stacking 1" blocks, incorporating precision pinch, wrist flexion, and coordination to stack up to 5 blocks.  Pt then unstacking with good control and pinch.  Attempted rotating large dice in finger tips or even pinch and rotate on table top, however due to decreased wrist flexion and decreased grasp and control pt unable to complete.  Transitioned to picking up smaller checker pieces and translating into palm.  Pt able to do with mod difficulty and incorporation of shoulder hike when attempting to translate checker pieces into palm.  Pt demonstrating decreased opening of grasp to release checkers once in palm of hand.  OT challenged pt to [employer]. Pt with ability to complete 3 with mod difficulty before reporting hand fatigue. Isolated finger movements: Engaged in abduction/adduction, finger extension, and thumb opposition.  Pt continues to have difficulty with adduction, ultimately requiring flexion into gross fist to bring fingers back together.  Pt completing hand over hand to facilitate increased success with finger extension.  With thumb opposition, pt able to oppose to index and long finger, but unable to progress to ring finger.   04/16/22 Retrograde massage: engaged in education on use of massage for spasticity and pain s/p CVA.  Pt receptive to education on usage of massage both during  therapy sessions and possibly pursuing massage therapy.  OT educated with demonstration and verbal cues on retrograde massage and use of circular motion with UE elevated for edema management as well.  Pt able to return demonstration and verbalize understanding. Nerve glides: completed median, ulnar, and radial nerve glides in standing with OT providing verbal cues and demonstration.  Pt reporting pain with ulnar nerve glides, especially when attempting to achieve internal rotation position.   BUE: engaged in folding laundry and placing on counter to R to facilitate increased usage of RUE during functional tasks.  Pt utilizing RUE 75% of task, frequently resuming just usage of LUE especially with increased challenge to attention (engaging in discussion).    PATIENT EDUCATION: Education details: Continued focus on coordination  and isolated finger exercises.  Educated on retrograde massage and massage prior to isolated finger exercises.   Person educated: Patient Education method: Explanation Education comprehension: verbalized understanding and needs further education   HOME EXERCISE PROGRAM: Fine motor coordination handout  Access Code: [email or web address] URL: [email or web address] Date: 03/20/2022 Prepared by: [facility]  Exercises - Seated Shoulder Shrug  - 1 sets - 10 reps - Seated Scapular Retraction  - 1 sets - 10 reps - Seated Shoulder Rolls  - 1 sets - 10 reps - Seated Shoulder Flexion Towel Slide at Table Top Full Range of Motion  - 1 sets - 10 reps  Access Code: [email or web address] URL: [email or web address] Date: 03/28/2022 Prepared by: [facility]  Exercises - Standing Median Nerve Glide  - 2-3 x daily - 1 sets - 10 reps - Standing Ulnar Nerve Glide  - 2-3 x daily - 1 sets - 10 reps - Standing Radial Nerve Glide  - 2-3 x daily - 1 sets - 10 reps - Median Nerve Glide  - 2-3 x daily - 1 sets  - 10 reps - Seated Digit Tendon Gliding  - 2-3 x daily - 1 sets - 10 reps - Open Book Chest Stretch on Towel Roll  - 1 x daily - 2 sets - 10 reps - [place]  - 1 x daily - 2 sets - 10 reps - Supine Shoulder Press with Dowel  - 1 x daily - 2 sets - 10 reps - Supine Shoulder Flexion with Dowel to 90  - 1 x daily - 2 sets - 10 reps    GOALS: Goals reviewed with patient? Yes  SHORT TERM GOALS: Target date: 03/28/22  Pt will be independent with HEP for fine and gross motor control. Baseline: Goal status: MET - 03/28/22  2.  Pt will demonstrate improved UE functional use for ADLs as evidenced by increasing functional use of RUE to pick up blocks to complete Box and Blocks assessment in standardized manner. Baseline:  Goal status: MET - moved 16 blocks with R on 04/02/22  3.  Pt will verbalize understanding with safety considerations and AE needs to ensure safety d/t decreased sensation. Baseline:  Goal status: MET - 03/28/22  NEW SHORT TERM GOALS: Target date: 05/16/22   Pt will be independent with advanced HEP for fine and gross motor control.  Baseline:  Goal status: INITIAL  2.   Pt will demonstrate improved UE functional use for ADLs as evidenced by increasing box/ blocks score by 4 blocks with RUE.  Baseline: R: 16 blocks and L: 53  Goal status: INITIAL  3.   Pt will demonstrate and verbalize safe mobility/engagement of RUE s/p rotator cuff tear during ADLs/IADLs.  Baseline:  Goal status: INITIAL   LONG TERM GOALS: Target date: 04/25/22  Pt and spouse will verbalize understanding for return to driving recommendations and pursue driving eval PRN. Baseline:  Goal status: MET - 04/14/22  2.  Pt will demonstrate improved UE functional use for ADLs as evidenced by increasing box/ blocks score by 10 blocks with RUE Baseline: unable to move any from box, however able to move 10 with gross grasp from blocks placed in OT palm Goal status: MET - moved 16 blocks with R on  04/02/22  3.  Pt will demonstrate increased functional reach and grasp to obtain light weight item from moderate height shelf. Baseline:  Goal status: NOT MET - Deferred  04/23/22 due to rotator cuff tear   4.  Pt will demonstrate increased hand function and grasp to open containers (jars, medications) and verbalize understanding of AE PRN to increase ease, safety, and independence with opening containers. Baseline:  Goal status: NOT MET  5.  Pt will demonstrate use of BUE together to engage in functional IADL tasks (simple meal prep, folding laundry, etc). Baseline:  Goal status: NOT MET  NEW LONG TERM GOALS: Target date: 06/06/22  Pt will demonstrate increased hand function and grasp to open containers (jars, medications) and verbalize understanding of AE PRN to increase ease, safety, and independence with opening containers. Baseline:  Goal status: INITIAL  2.  Pt will demonstrate use of BUE together to engage in functional IADL tasks (simple meal prep, folding laundry, etc). Baseline:  Goal status: INITIAL  3.  Pt will demonstrate increased functional reach and grasp to obtain or place light weight item from/on moderate height shelf to simulate IADLs. Baseline:  Goal status: INITIAL  4. Pt will increase RUE grip strength by 5# to increase success with holding and manipulating dishes and pots/pain for cooking.  Baseline:  Goal status: INITIAL  5.  Pt will be able to complete 9 hole peg test with RUE in 2 min time limit to demonstrate increased fine motor control as needed for ADLs/IADLs.  Baseline: able to place 2 pegs in 2 min time limit  Goal status: INITIAL   ASSESSMENT:  CLINICAL IMPRESSION: Treatment session with focus on movement through pain free range with primary focus on coordination this session.  Pt with no c/o pain with table top activities, incorporating min shoulder flexion for stacking.  Pt tolerating retrograde massage to digits and hand into forearm.   Reviewed isolated finger movements post massage with some improvements with ability to nearly reach ring finger.  Pt currently limited in progress with overhead reaching due to R rotator cuff tear, therefore tasks have been more focused on coordination and hand function.  Pt will continue to benefit from ongoing occupational therapy with focus on gross and fine motor coordination as effected extremity is pt's dominant extremity and is impacting her independence with ADLs and IADLs.  PERFORMANCE DEFICITS in functional skills including ADLs, IADLs, coordination, dexterity, sensation, ROM, strength, FMC, GMC, balance, body mechanics, decreased knowledge of use of DME, and UE functional use and psychosocial skills including environmental adaptation, habits, and routines and behaviors.   IMPAIRMENTS are limiting patient from ADLs, IADLs, and work.   COMORBIDITIES may have co-morbidities  that affects occupational performance. Patient will benefit from skilled OT to address above impairments and improve overall function.  MODIFICATION OR ASSISTANCE TO COMPLETE EVALUATION: Min-Moderate modification of tasks or assist with assess necessary to complete an evaluation.  OT OCCUPATIONAL PROFILE AND HISTORY: Detailed assessment: Review of records and additional review of physical, cognitive, psychosocial history related to current functional performance.  CLINICAL DECISION MAKING: Moderate - several treatment options, min-mod task modification necessary  REHAB POTENTIAL: Good  EVALUATION COMPLEXITY: Moderate    PLAN: OT FREQUENCY: 2x/week  OT DURATION: 6 weeks  PLANNED INTERVENTIONS: self care/ADL training, therapeutic exercise, therapeutic activity, neuromuscular re-education, manual therapy, passive range of motion, balance training, functional mobility training, splinting, electrical stimulation, ultrasound, compression bandaging, moist heat, cryotherapy, patient/family education, psychosocial skills  training, energy conservation, coping strategies training, and DME and/or AE instructions  RECOMMENDED OTHER SERVICES: N/A  CONSULTED AND AGREED WITH PLAN OF CARE: Patient and family member/caregiver  PLAN FOR NEXT SESSION: Await MD f/u about  MRI results in regards to shoulder ROM.  Engage in wrist ROM and hand opening/closing and attempt finger isolation exercises/activities. Gross grasp and reach activities.  Progress to increased strengthening as able.  Massage (as appropriate) and tone management.   Simonne Come, OTR/L 04/23/2022, 1:13 PM

## 2022-04-25 ENCOUNTER — Encounter
Payer: Federal, State, Local not specified - PPO | Attending: Registered Nurse | Admitting: Physical Medicine and Rehabilitation

## 2022-04-25 DIAGNOSIS — R4701 Aphasia: Secondary | ICD-10-CM | POA: Diagnosis not present

## 2022-04-25 DIAGNOSIS — S46011A Strain of muscle(s) and tendon(s) of the rotator cuff of right shoulder, initial encounter: Secondary | ICD-10-CM | POA: Insufficient documentation

## 2022-04-26 NOTE — Progress Notes (Signed)
Subjective:    Patient ID: Chloe Johnson, female    DOB: 1955-06-23, 66 y.o.   MRN: [id number]  HPI:  An audio/video tele-health visit is felt to be the most appropriate encounter for this patient at this time. This is a follow up tele-visit via MyChart. The patient is at home. MD is at office. Prior to scheduling this appointment, our staff discussed the limitations of evaluation and management by telemedicine and the availability of in-person appointments. The patient expressed understanding and agreed to proceed.   Chloe Johnson is a 66 y.o. female who is here for f/u of right shoulder pain, Left Middle Cerebral Artery Stroke, Left Ventricular Thrombus and Essential Hypertension.   Dr. Curly Shores H&P on 01/28/2022 HPI: Chloe Johnson is a 66 y.o. 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 Leonel Ramsay, with key details confirmed by husband Chloe Johnson  is a 36 y.o.  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 She presents today after the following series events.  Around 12 PM this past Saturday, the patient noted sudden onset dizziness described as "vertigo" followed by nausea then followed by an episode of vomiting.  She next felt a sudden onset sharp chest pain with radiation to her back.  She assumed it was her cervical radiculopathy "acting up" because shortly after this event, she was holding 2 plates in her hand to feed her cats when  she suddenly had no control of both upper extremities and dropped said plates.  On Sunday, she felt generally weak but then improved from all of this until this morning, around 9 AM, when she experienced word finding difficulties.  Her husband, who is at bedside, also notes she had a right facial droop transiently that lasted no more than a few minutes.  She may have had 2 subsequent episodes, as documented by ED physician; however, all of her is told by the patient's husband that this was 1 single episode."   Her hospital course was noted for acute worsening for which she underwent thrombectomy and was discharged with only some very mild residual aphasia not detectable on examination but noticed by the patient per husband.  He notes that she has been in her normal state of health since discharge other than a visit to the ED on 9/3 with left shoulder pain which was attributed to her known cervical stenosis for which she is planned for surgery in the near future   Husband reports that she was taking his blood pressure when suddenly the blood pressure monitor fell off the table.  She tried to catch it but lost her balance had weakness in both of her legs.  Initially she got to both both arms but then became weak on the right side.  She was unable to speak at all suddenly during this event.  Work-up revealed a left ventricle apical thrombus.   Has been reports she has not missed any of her doses of medication including Eliquis   CTA was attempted to confirm recurrent acute occlusion, however due to IV infiltration and severity of her  symptoms decision was made to proceed directly to IR which was discussed both with [name] as well as with the patient's husband   MR Brain:  IMPRESSION: 1. 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 2. No intracranial large vessel occlusion or significant stenosis. In particular, left MCA branches appear perfused and without  focal stenosis.  MRA:  IMPRESSION: 1. 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 2. No intracranial large vessel occlusion or significant stenosis. In particular, left MCA branches appear perfused and without focal stenosis.  [name] COURSE Stroke:  left MCA punctate scattered infarct with left M2 occlusion s/p IR with TICI2c, embolic secondary to LV thrombus recently on Eliquis CT no acute abnormality IR left superior M2 occlusion, inferior M3/M4 nonocclusive thrombus Status post EVT with TICI2c MRI left insular cortex and frontal lobe punctate infarcts MRA unremarkable 2D Echo EF 45 to 50% on 8/29, LV regional motion abnormality with LV thrombus LDL 150 on 8/29 HgbA1c 5 0 on 8/29 UDS positive for THC Hypercoagulable and autoimmune work up negative Heparin IV for VTE prophylaxis aspirin 81 mg daily and Eliquis (apixaban) daily prior to admission,  now on coumadin with lovenox bridge. INR goal 2.5 Continue ASA 81 per cardiology. Ongoing aggressive stroke risk factor management Therapy recommendations:  CIR Disposition:  pending   Hx of stroke and LV thrombus 8/29 admitted for nausea vomiting dizziness chest pain and then developed right facial droop and aphasia.  Found to have troponin > 300, EF 45 to 50% with LV thrombus.  CTA coronary artery showed minimal CAD.  CT no acute abnormality.  CTA head and neck showed left M2 occlusion.  CTP 0/7.  Status post EVT with TICI2c.  MRI showed left frontal Cina matter punctate infarct.  Repeat MRI showed left external capsule, insular cortex and frontal cortex punctate infarcts.  LDL 150, A1c 5.0.  UDS positive for THC.  Patient was put on heparin IV and aspirin and eventually discharged on Eliquis and aspirin as well as Crestor 20. Per family, patient has slight expressive aphasia since discharge, no physical deficit. Cardiology [name] on board, recommend coumadin with INR goal 2.5. on  lovenox bridge.    Chloe Johnson was admitted to inpatient rehabilitation on 02/04/2022 and discharged home on 02/07/2022. She will begin outpatient Therapy on 02/25/2022 at [facility].  Chloe Johnson has expressive aphasia.  She was positive with THC, she reports she uses CBD products for anxiety, this was discussed with [name]. [name] stated she can continue with her CBD product, she verbalizes understanding.   1) Right shoulder pain  -started suddenly when she was doing an activity (history is limited by her aphasia) -she feels this pain is more than just spasticity -it is very severe -she has been doing better with the increase in Lyrica to 200mg  BID -XR discussed and she would like to get this -continues to be excruciating and she is interested in surgical eval, she now has an appointment scheduled with Dr. -she prefers nonsurgical treatment if possible.   2) Aphasia is greatly improving and she continues working with therapy  Pain Inventory Average Pain 4 Pain Right Now 4 My pain is constant and aching  LOCATION OF PAIN  Neck,Wrist,Hand,Back  BOWEL Number of stools per week: 14  Type of laxative Super Cleanse  BLADDER Normal    Mobility walk without assistance how many minutes can you walk? As  needed ability to climb steps?  yes do you drive?  no Do you have any goals in this area?  yes  Function employed # of hrs/week 70  Neuro/Psych bowel control problems weakness numbness anxiety  Prior Studies Any changes since last visit?  no  Physicians involved in your care Any changes since last visit?  no   Family History  Problem Relation Age of Onset   Other Mother    Other Father    Hypertension Father    Social History   Socioeconomic History   Marital status: Married    Spouse name: Chloe Johnson   Number of children: 3   Years of education: RN   Highest education level: Not on file  Occupational History     Employer: [employer]   Tobacco Use   Smoking status: Never   Smokeless tobacco: Never  Vaping Use   Vaping Use: Never used  Substance and Sexual Activity   Alcohol use: Yes    Alcohol/week: 6.0 standard drinks of alcohol    Types: 3 Glasses of wine, 3 Shots of liquor per week   Drug use: No   Sexual activity: Yes    Birth control/protection: Post-menopausal  Other Topics Concern   Not on file  Social History Narrative   Patient is married and lives at home with her husband Chloe Johnson).  Patient works full time as a Therapist, sports.   Right handed.   College education.   Social Determinants of Health   Financial Resource Strain: Not on file  Food Insecurity: Not on file  Transportation Needs: Not on file  Physical Activity: Not on file  Stress: Not on file  Social Connections: Not on file   Past Surgical History:  Procedure Laterality Date   BREAST CYST EXCISION Left 2010   "polypectomy"   IR CT HEAD LTD  01/21/2022   IR CT HEAD LTD  01/28/2022   IR PERCUTANEOUS ART THROMBECTOMY/INFUSION INTRACRANIAL INC DIAG ANGIO  01/21/2022   IR PERCUTANEOUS ART THROMBECTOMY/INFUSION INTRACRANIAL INC DIAG ANGIO  01/28/2022   IR US GUIDE VASC ACCESS RIGHT  01/22/2022   PICC LINE PLACE PERIPHERAL ([place] [facility]) Right    for use of Levaquin & Vancomycin, of note: she reports that she had a "flulike feeling"    RADIOLOGY WITH ANESTHESIA N/A 01/21/2022   Procedure: IR WITH ANESTHESIA;  Surgeon: Luanne Bras, MD;  Location: [place];  Service: Radiology;  Laterality: N/A;   RADIOLOGY WITH ANESTHESIA N/A 01/28/2022   Procedure: IR WITH ANESTHESIA;  Surgeon: Radiologist, Medication, MD;  Location: [place];  Service: Radiology;  Laterality: N/A;   TONSILLECTOMY AND ADENOIDECTOMY  1975   TUBAL LIGATION  1999   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/16/2013   L3-4/notes 11/24/2013, arms, neck   Exertional asthma    GERD (gastroesophageal reflux disease)     Hypertension    Kidney stones    "have always passed them"   Neck pain    Osteomyelitis ([place]) 11/23/2013   osteomyelitis, discitis    Sleep concern    uses Prozac for sleep    There were no vitals taken for this visit.  Opioid Risk Score:   Fall Risk Score:  `1  Depression screen [facility] 2/9     02/20/2022    9:22 AM 02/15/2014   11:06 AM 01/18/2014    2:15 PM 12/23/2013   10:14 AM  Depression screen PHQ  2/9  Decreased Interest 0 0 0 0  Down, Depressed, Hopeless 0 1 0 0  PHQ - 2 Score 0 1 0 0  Altered sleeping 0     Tired, decreased energy 1     Change in appetite 0     Feeling bad or failure about yourself  0     Trouble concentrating 0     Moving slowly or fidgety/restless 0     Suicidal thoughts 0     PHQ-9 Score 1     Difficult doing work/chores Not difficult at all         Review of Systems  Musculoskeletal:  Positive for back pain and neck pain.       Wrist and hand pain   All other systems reviewed and are negative.      Objective:   Not performed       Assessment & Plan:  Left Middle Cerebral Artery Stroke: Continue current medication regimen. Has a scheduled appointment with Neurology. She will begin outpatient therapy at [place]. Continue to Monitor. Continue speech therapy to continue working on aphasia Left Ventricular Thrombus: Continue current medication regimen. Cardiology Following.  Essential Hypertension. Continue current medication regimen. PCP Following. Continue to Monitor.  Right shoulder pain: Discussed that MRI shows complete tear, referred to ortho Discussed extracorporeal shockwave therapy as a modality for treatment. Discussed that the device looks and feels like a massage gun and I would move it over the area of pain for about 10 minutes. The device releases sound waves to the area of pain and helps to improve blood flow and circulation to improve the healing process. Discuss that this initially induces inflammation  and can sometimes cause short-term increase in pain. Discussed that we typically do three weekly treatments, but sometimes up to 6 if needed, and after 6 weeks long term benefits can sometimes be achieved.  Lyrica increased to 200mg  BID, discussed that she can continue this dose if she wants but given her unwanted weight gain we can alternatively replace her morning dose with low dose naltrexone. She preferred to try this latter option. -discussed mechanism of action of low dose naltrexone as an opioid receptor antagonist which stimulates your body's production of its own natural endogenous opioids, helping to decrease pain. Discussed that it can also decrease T cell response and thus be helpful in decreasing inflammation, and symptoms of brain fog, fatigue, anxiety, depression, and allergies. Discussed that this medication needs to be compounded at a compounding pharmacy and can more expensive. Discussed that I usually start at 1mg  and if this is not providing enough relief then I titrate upward on a monthly basis.

## 2022-04-28 ENCOUNTER — Ambulatory Visit: Payer: Federal, State, Local not specified - PPO | Attending: Physical Medicine and Rehabilitation

## 2022-04-28 DIAGNOSIS — R4701 Aphasia: Secondary | ICD-10-CM | POA: Insufficient documentation

## 2022-04-28 DIAGNOSIS — I69351 Hemiplegia and hemiparesis following cerebral infarction affecting right dominant side: Secondary | ICD-10-CM | POA: Insufficient documentation

## 2022-04-28 DIAGNOSIS — M6281 Muscle weakness (generalized): Secondary | ICD-10-CM | POA: Diagnosis not present

## 2022-04-28 DIAGNOSIS — R208 Other disturbances of skin sensation: Secondary | ICD-10-CM | POA: Insufficient documentation

## 2022-04-28 DIAGNOSIS — R482 Apraxia: Secondary | ICD-10-CM | POA: Diagnosis not present

## 2022-04-28 DIAGNOSIS — R278 Other lack of coordination: Secondary | ICD-10-CM | POA: Insufficient documentation

## 2022-04-28 DIAGNOSIS — R471 Dysarthria and anarthria: Secondary | ICD-10-CM | POA: Diagnosis not present
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
## 2022-04-30 ENCOUNTER — Ambulatory Visit: Payer: Federal, State, Local not specified - PPO

## 2022-04-30 ENCOUNTER — Ambulatory Visit: Payer: Federal, State, Local not specified - PPO | Admitting: Occupational Therapy

## 2022-04-30 ENCOUNTER — Other Ambulatory Visit: Payer: Self-pay

## 2022-04-30 DIAGNOSIS — M6281 Muscle weakness (generalized): Secondary | ICD-10-CM | POA: Diagnosis not present

## 2022-04-30 DIAGNOSIS — R471 Dysarthria and anarthria: Secondary | ICD-10-CM | POA: Diagnosis not present

## 2022-04-30 DIAGNOSIS — R482 Apraxia: Secondary | ICD-10-CM

## 2022-04-30 DIAGNOSIS — R278 Other lack of coordination: Secondary | ICD-10-CM | POA: Diagnosis not present

## 2022-04-30 DIAGNOSIS — I69351 Hemiplegia and hemiparesis following cerebral infarction affecting right dominant side: Secondary | ICD-10-CM | POA: Diagnosis not present

## 2022-04-30 DIAGNOSIS — R208 Other disturbances of skin sensation: Secondary | ICD-10-CM

## 2022-04-30 DIAGNOSIS — R4701 Aphasia: Secondary | ICD-10-CM

## 2022-04-30 NOTE — Patient Outreach (Signed)
Telephone outreach to patient to obtain mRS was successfully completed. MRS= 3  Chloe Johnson [facility] Care Management Assistant [phone]  

## 2022-04-30 NOTE — Therapy (Signed)
OUTPATIENT OCCUPATIONAL THERAPY  Treatment Note  Patient Name: Chloe Johnson MRN: [id number] DOB:[birth year], 66 y.o., female Today's Date: 04/30/2022  PCP: Thana Ates, MD REFERRING PROVIDER: Horton Chin, MD    OT End of Session - 04/30/22 1213     Visit Number 14    Number of Visits 25    Date for OT Re-Evaluation 06/06/22    Authorization Type [employer]    OT Start Time 1148    OT Stop Time 1234    OT Time Calculation (min) 46 min                       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/16/2013   L3-4/notes 11/24/2013, arms, neck   Exertional asthma    GERD (gastroesophageal reflux disease)    Hypertension    Kidney stones    "have always passed them"   Neck pain    Osteomyelitis (HCC) 11/23/2013   osteomyelitis, discitis    Sleep concern    uses Prozac for sleep    Past Surgical History:  Procedure Laterality Date   BREAST CYST EXCISION Left 2010   "polypectomy"   IR CT HEAD LTD  01/21/2022   IR CT HEAD LTD  01/28/2022   IR PERCUTANEOUS ART THROMBECTOMY/INFUSION INTRACRANIAL INC DIAG ANGIO  01/21/2022   IR PERCUTANEOUS ART THROMBECTOMY/INFUSION INTRACRANIAL INC DIAG ANGIO  01/28/2022   IR US GUIDE VASC ACCESS RIGHT  01/22/2022   PICC LINE PLACE PERIPHERAL ([facility] HX) Right    for use of Levaquin & Vancomycin, of note: she reports that she had a "flulike feeling"    RADIOLOGY WITH ANESTHESIA N/A 01/21/2022   Procedure: IR WITH ANESTHESIA;  Surgeon: Julieanne Cotton, MD;  Location: [facility] OR;  Service: Radiology;  Laterality: N/A;   RADIOLOGY WITH ANESTHESIA N/A 01/28/2022   Procedure: IR WITH ANESTHESIA;  Surgeon: Radiologist, Medication, MD;  Location: [facility] OR;  Service: Radiology;  Laterality: N/A;   TONSILLECTOMY AND ADENOIDECTOMY  1975   TUBAL LIGATION  1999   Patient Active Problem List   Diagnosis Date Noted   Aphasia 03/31/2022   Snores 03/31/2022   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   UTI  (urinary tract infection) 02/04/2022   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   Stroke determined by clinical assessment (HCC) 01/28/2022   Middle cerebral artery embolism, left 01/28/2022   H/O ischemic left MCA stroke    Acute respiratory failure (HCC)    CVA (cerebral vascular accident) (HCC) 01/21/2022   NSTEMI (non-ST elevated myocardial infarction) (HCC) 01/21/2022   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   HFrEF (heart failure with reduced ejection fraction) (HCC) 01/21/2022   Hyperlipidemia 01/21/2022   Lumbar stenosis 01/21/2022   Stroke (cerebrum) (HCC) 01/21/2022   S/P lumbar spinal fusion 11/15/2014   Lumbar discitis 11/29/2013   Discitis 11/24/2013   Diarrhea 11/17/2013   Osteomyelitis of lumbar spine (HCC) 11/16/2013   Hypertension    Asthma    Anxiety     ONSET DATE: 01/28/22  REFERRING DIAG: I63.9 (ICD-10-CM) - Cerebral infarction, unspecified   THERAPY DIAG:  Hemiplegia and hemiparesis following cerebral infarction affecting right dominant side (HCC)  Muscle weakness (generalized)  Other lack of coordination  Other disturbances of skin sensation  Rationale for Evaluation and Treatment Rehabilitation  SUBJECTIVE:   SUBJECTIVE STATEMENT: Pt reports that she is able to tie her shoes now! Pt  accompanied by: self  PERTINENT HISTORY: PMH: HTN, cervical radiculopathy, hepatitis, osteomyelitis/discitis, lumbar stenosis with foot drop anxiety, GERD, insomnia  PRECAUTIONS: Fall  WEIGHT BEARING RESTRICTIONS  was awaiting spinal fusion surgery, limit bending, twisting, lifting >10#  PAIN:  Are you having pain? Yes: NPRS scale: 2/10 Pain location: R upper arm Pain description: sharp, aching Aggravating factors: certain movements Relieving factors: pain meds  FALLS: Has patient fallen in last 6 months? Yes. Number of falls 2  LIVING ENVIRONMENT: Lives with: lives with their spouse Lives in: House/apartment Stairs: Yes: Internal:  full flights of steps; on right going up and External: 7 steps; on left going up Has following equipment at home: Single point cane, Walker - 2 wheeled, Walker - 4 wheeled, shower chair, and bed side commode  PLOF: Independent  PATIENT GOALS I want my arm back.    OBJECTIVE:   HAND DOMINANCE: Right  ADLs: Transfers/ambulation related to ADLs: Mod I with no use of  Eating: Husband assists with cutting, feeding self with L hand Grooming: utilizing L hand with opening and brushing teeth UB Dressing: unable to manage buttons, otherwise Mod I LB Dressing: unable to tie shoes, just slipping on toes Toileting: Mod I with use of L hand Bathing: Mod I, utilizing L hand to shave Tub Shower transfers: Mod I Equipment: Shower seat with back, Walk in shower, and bed side commode   IADLs: Light housekeeping: laundry and hanging up clothes, wipes counters/sink, needs assist to fold clothing Meal Prep: no, did not cook pre-stroke Medication management: husband opens pill bottles Handwriting: unable  MOBILITY STATUS: Needs Assist: Supervision - Mod I without use of AD  POSTURE COMMENTS:  No Significant postural limitations  ACTIVITY TOLERANCE: Activity tolerance: WNL for tasks assessed on eval  FUNCTIONAL OUTCOME MEASURES: FOTO: 49  UPPER EXTREMITY ROM     Active ROM Right eval Left eval  Shoulder flexion WNL WNL  Shoulder abduction    Shoulder adduction    Shoulder extension    Shoulder internal rotation    Shoulder external rotation    Elbow flexion    Elbow extension -5 WNL  Wrist flexion 38 WNL  Wrist extension 50 WNL  Wrist ulnar deviation    Wrist radial deviation    Wrist pronation    Wrist supination    (Blank rows = not tested)   UPPER EXTREMITY MMT:     MMT Right eval Left eval  Shoulder flexion 4+/5   Shoulder abduction    Shoulder adduction    Shoulder extension    Shoulder internal rotation    Shoulder external rotation    Middle trapezius     Lower trapezius    Elbow flexion 4+/5   Elbow extension 4+/5   Wrist flexion    Wrist extension    Wrist ulnar deviation    Wrist radial deviation    Wrist pronation    Wrist supination    (Blank rows = not tested)  HAND FUNCTION: Loose gross grasp, unable to squeeze dynamometer  COORDINATION: Box and Blocks:  Right 10 blocks, Left 53 blocks Modified performance with RUE with therapist holding blocks in palm and pt able to complete gross grasp to pick up one at a time from OT palm of hand, minimal hand opening/closing 04/02/22: Right: 16 blocks  SENSATION: Light touch: WFL Stereognosis: Impaired  Hot/Cold: Impaired  Proprioception: Impaired   COGNITION: Overall cognitive status: Within functional limits for tasks assessed  VISION: Subjective report: Pt reports that her R eye  does not close as well and that she feels that her L eye is compensating for her R eye. Baseline vision: Wears glasses all the time  VISION ASSESSMENT: To be further assessed in functional context  --------------------------------------------------------------------------------------------------------------------------------------------------------------------- (objective measures above completed at initial evaluation unless otherwise dated)  TODAY'S TREATMENT:  04/30/22 Pt reports having consult with Dr. Everardo Pacific tomorrow about her shoulder.   Wrist flexion/extension: engaged in [facility] with hand placed on ball and rolling ball to facilitate wrist flexion/extension.  OT provided min cues and demonstration, encouraging pt to hold at end ranges 3-5 seconds for stretch.  Transitioned then to ulnar/radial deviation while on ball, then incorporating wrist circumduction clockwise and counter clockwise.  OT providing demonstration and cues to maintain forearm positioning for improved wrist movement. Block stacking: engaged in stacking of 1x1x3 foam blocks with focus on wrist flexion/extension with stacking.  OT  providing min cues for forearm placement to facilitate increased wrist mobility.  Pt demonstrating improved motor control and wrist extension. Peg board: OT challenged pt to engage in pinch and rotation of pegs to place in to peg board.  Pt able to pick up large grip pegs with precision pinch, however continues to demonstrate difficulties with rotation to flip peg 180* to then place in peg board.  Pt able to do with significant effort and multiple shifts, demonstrating fatigue as task progressed.   04/23/22 Coordination: picking up Connect 4 pieces and placing in Connect 4 grid.  Focus on pinch and rotation of pieces, pt requiring use of table top to aid in rotation.  OT challenged pt to stack connect 4 pieces requiring wrist extension and precision pinch.  Pt with increased difficulty stacking, therefore OT modified task to having pt remove stacked pieces.  Pt demonstrating min wrist extension with attempts at stacking unstacking.   Retrograde massage: OT provided retrograde massage to RUE, primarily at thumb, index, and long finger while educating on technique and completing with UE elevated to facilitate increased movement of edema.   Grip and isolated finger movements after massage.  Pt with improved thumb opposition against mild resistance of foam block and ability to nearly reach to ring finger this session.    04/21/22 Discussion about MRI results and upcoming appt with Dr. Carlis Abbott.  Discussed concerns of therapy progress if MD were to recommend surgery.  Discussed typical surgical protocol, limitations with recommendation of surgery post CVA due to on blood thinners, as well as typical recovery timeline post CVA. Coordination: picking up and stacking 1" blocks, incorporating precision pinch, wrist flexion, and coordination to stack up to 5 blocks.  Pt then unstacking with good control and pinch.  Attempted rotating large dice in finger tips or even pinch and rotate on table top, however due to  decreased wrist flexion and decreased grasp and control pt unable to complete.  Transitioned to picking up smaller checker pieces and translating into palm.  Pt able to do with mod difficulty and incorporation of shoulder hike when attempting to translate checker pieces into palm.  Pt demonstrating decreased opening of grasp to release checkers once in palm of hand.  OT challenged pt to [employer]. Pt with ability to complete 3 with mod difficulty before reporting hand fatigue. Isolated finger movements: Engaged in abduction/adduction, finger extension, and thumb opposition.  Pt continues to have difficulty with adduction, ultimately requiring flexion into gross fist to bring fingers back together.  Pt completing hand over hand to facilitate increased success with finger extension.  With thumb  opposition, pt able to oppose to index and long finger, but unable to progress to ring finger.    PATIENT EDUCATION: Education details: wrist mobility Person educated: Patient Education method: Explanation, Demonstration, and Verbal cues Education comprehension: verbalized understanding and needs further education   HOME EXERCISE PROGRAM: Fine motor coordination handout  Access Code: [email or web address] URL: [email or web address] Date: 03/20/2022 Prepared by: [facility] - Outpatient  Rehab - [facility]  Exercises - Seated Shoulder Shrug  - 1 sets - 10 reps - Seated Scapular Retraction  - 1 sets - 10 reps - Seated Shoulder Rolls  - 1 sets - 10 reps - Seated Shoulder Flexion Towel Slide at Table Top Full Range of Motion  - 1 sets - 10 reps  Access Code: [email or web address] URL: [email or web address] Date: 03/28/2022 Prepared by: [facility] - Outpatient  Rehab - [facility]  Exercises - Standing Median Nerve Glide  - 2-3 x daily - 1 sets - 10 reps - Standing Ulnar Nerve Glide  - 2-3 x daily - 1 sets - 10 reps - Standing Radial Nerve Glide  - 2-3 x daily - 1 sets - 10  reps - Median Nerve Glide  - 2-3 x daily - 1 sets - 10 reps - Seated Digit Tendon Gliding  - 2-3 x daily - 1 sets - 10 reps - Open Book Chest Stretch on Towel Roll  - 1 x daily - 2 sets - 10 reps - [place]  - 1 x daily - 2 sets - 10 reps - Supine Shoulder Press with Dowel  - 1 x daily - 2 sets - 10 reps - Supine Shoulder Flexion with Dowel to 90  - 1 x daily - 2 sets - 10 reps    GOALS: Goals reviewed with patient? Yes  SHORT TERM GOALS: Target date: 05/16/22   Pt will be independent with advanced HEP for fine and gross motor control.  Baseline:  Goal status: In progress  2.   Pt will demonstrate improved UE functional use for ADLs as evidenced by increasing box/ blocks score by 4 blocks with RUE.  Baseline: R: 16 blocks and L: 53  Goal status: In progress  3.   Pt will demonstrate and verbalize safe mobility/engagement of RUE s/p rotator cuff tear during ADLs/IADLs.  Baseline:  Goal status: In progress   LONG TERM GOALS: Target date: 06/06/22  Pt will demonstrate increased hand function and grasp to open containers (jars, medications) and verbalize understanding of AE PRN to increase ease, safety, and independence with opening containers. Baseline:  Goal status: In progress  2.  Pt will demonstrate use of BUE together to engage in functional IADL tasks (simple meal prep, folding laundry, etc). Baseline:  Goal status: In progress  3.  Pt will demonstrate increased functional reach and grasp to obtain or place light weight item from/on moderate height shelf to simulate IADLs. Baseline:  Goal status: In progress  4. Pt will increase RUE grip strength by 5# to increase success with holding and manipulating dishes and pots/pain for cooking.  Baseline:  Goal status: In progress  5.  Pt will be able to complete 9 hole peg test with RUE in 2 min time limit to demonstrate increased fine motor control as needed for ADLs/IADLs.  Baseline: able to place 2 pegs in 2 min time  limit  Goal status: In progress   ASSESSMENT:  CLINICAL IMPRESSION: Treatment session with focus on movement through pain free range with primary focus on wrist ROM  this session.  Pt with no c/o pain with table top activities, facilitating increased wrist flexion/extension during table top activities.  Pt able to elicit increased extension by end of session as well as some rotation/shift with pegs this session! Pt currently limited in progress with overhead reaching due to R rotator cuff tear, therefore tasks have been more focused on coordination and hand function.    PERFORMANCE DEFICITS in functional skills including ADLs, IADLs, coordination, dexterity, sensation, ROM, strength, FMC, GMC, balance, body mechanics, decreased knowledge of use of DME, and UE functional use and psychosocial skills including environmental adaptation, habits, and routines and behaviors.   IMPAIRMENTS are limiting patient from ADLs, IADLs, and work.   COMORBIDITIES may have co-morbidities  that affects occupational performance. Patient will benefit from skilled OT to address above impairments and improve overall function.  MODIFICATION OR ASSISTANCE TO COMPLETE EVALUATION: Min-Moderate modification of tasks or assist with assess necessary to complete an evaluation.  OT OCCUPATIONAL PROFILE AND HISTORY: Detailed assessment: Review of records and additional review of physical, cognitive, psychosocial history related to current functional performance.  CLINICAL DECISION MAKING: Moderate - several treatment options, min-mod task modification necessary  REHAB POTENTIAL: Good  EVALUATION COMPLEXITY: Moderate    PLAN: OT FREQUENCY: 2x/week  OT DURATION: 6 weeks  PLANNED INTERVENTIONS: self care/ADL training, therapeutic exercise, therapeutic activity, neuromuscular re-education, manual therapy, passive range of motion, balance training, functional mobility training, splinting, electrical stimulation, ultrasound,  compression bandaging, moist heat, cryotherapy, patient/family education, psychosocial skills training, energy conservation, coping strategies training, and DME and/or AE instructions  RECOMMENDED OTHER SERVICES: N/A  CONSULTED AND AGREED WITH PLAN OF CARE: Patient and family member/caregiver  PLAN FOR NEXT SESSION: Await MD f/u about MRI results in regards to shoulder ROM.  Engage in wrist ROM and hand opening/closing and attempt finger isolation exercises/activities. Gross grasp and reach activities.  Progress to increased strengthening as able.  Massage (as appropriate) and tone management.   Rosalio Loud, OTR/L 04/30/2022, 12:38 PM
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
## 2022-05-01 ENCOUNTER — Encounter (HOSPITAL_BASED_OUTPATIENT_CLINIC_OR_DEPARTMENT_OTHER): Payer: Federal, State, Local not specified - PPO | Admitting: Physical Medicine and Rehabilitation

## 2022-05-01 ENCOUNTER — Other Ambulatory Visit: Payer: Self-pay | Admitting: Physical Medicine and Rehabilitation

## 2022-05-01 DIAGNOSIS — S46011A Strain of muscle(s) and tendon(s) of the rotator cuff of right shoulder, initial encounter: Secondary | ICD-10-CM | POA: Diagnosis not present

## 2022-05-01 DIAGNOSIS — M25511 Pain in right shoulder: Secondary | ICD-10-CM | POA: Diagnosis not present

## 2022-05-01 MED ORDER — TOPIRAMATE 25 MG PO TABS: 25.0000 mg | ORAL_TABLET | Freq: Every day | ORAL | 3 refills | Status: DC | PRN

## 2022-05-02 ENCOUNTER — Ambulatory Visit: Payer: Federal, State, Local not specified - PPO | Admitting: Occupational Therapy

## 2022-05-02 DIAGNOSIS — R278 Other lack of coordination: Secondary | ICD-10-CM

## 2022-05-02 DIAGNOSIS — R208 Other disturbances of skin sensation: Secondary | ICD-10-CM | POA: Diagnosis not present

## 2022-05-02 DIAGNOSIS — R482 Apraxia: Secondary | ICD-10-CM | POA: Diagnosis not present

## 2022-05-02 DIAGNOSIS — M6281 Muscle weakness (generalized): Secondary | ICD-10-CM | POA: Diagnosis not present

## 2022-05-02 DIAGNOSIS — I69351 Hemiplegia and hemiparesis following cerebral infarction affecting right dominant side: Secondary | ICD-10-CM

## 2022-05-02 DIAGNOSIS — R471 Dysarthria and anarthria: Secondary | ICD-10-CM | POA: Diagnosis not present

## 2022-05-02 DIAGNOSIS — R4701 Aphasia: Secondary | ICD-10-CM | POA: Diagnosis not present

## 2022-05-02 NOTE — Therapy (Signed)
OUTPATIENT OCCUPATIONAL THERAPY  Treatment Note  Patient Name: Chloe Johnson MRN: [id number] DOB:Oct 22, 1955, 66 y.o., female Today's Date: 05/02/2022  PCP: Thana Ates, MD REFERRING PROVIDER: Horton Chin, MD    OT End of Session - 05/02/22 [phone]     Visit Number 15    Number of Visits 25    Date for OT Re-Evaluation 06/06/22    Authorization Type [employer]    OT Start Time 0934    OT Stop Time 1015    OT Time Calculation (min) 41 min                        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/16/2013   L3-4/notes 11/24/2013, arms, neck   Exertional asthma    GERD (gastroesophageal reflux disease)    Hypertension    Kidney stones    "have always passed them"   Neck pain    Osteomyelitis (HCC) 11/23/2013   osteomyelitis, discitis    Sleep concern    uses Prozac for sleep    Past Surgical History:  Procedure Laterality Date   BREAST CYST EXCISION Left 2010   "polypectomy"   IR CT HEAD LTD  01/21/2022   IR CT HEAD LTD  01/28/2022   IR PERCUTANEOUS ART THROMBECTOMY/INFUSION INTRACRANIAL INC DIAG ANGIO  01/21/2022   IR PERCUTANEOUS ART THROMBECTOMY/INFUSION INTRACRANIAL INC DIAG ANGIO  01/28/2022   IR US GUIDE VASC ACCESS RIGHT  01/22/2022   PICC LINE PLACE PERIPHERAL ([facility] HX) Right    for use of Levaquin & Vancomycin, of note: she reports that she had a "flulike feeling"    RADIOLOGY WITH ANESTHESIA N/A 01/21/2022   Procedure: IR WITH ANESTHESIA;  Surgeon: Julieanne Cotton, MD;  Location: [facility] OR;  Service: Radiology;  Laterality: N/A;   RADIOLOGY WITH ANESTHESIA N/A 01/28/2022   Procedure: IR WITH ANESTHESIA;  Surgeon: Radiologist, Medication, MD;  Location: [facility] OR;  Service: Radiology;  Laterality: N/A;   TONSILLECTOMY AND ADENOIDECTOMY  1975   TUBAL LIGATION  1999   Patient Active Problem List   Diagnosis Date Noted   Aphasia 03/31/2022   Snores 03/31/2022   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    UTI (urinary tract infection) 02/04/2022   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   Stroke determined by clinical assessment (HCC) 01/28/2022   Middle cerebral artery embolism, left 01/28/2022   H/O ischemic left MCA stroke    Acute respiratory failure (HCC)    CVA (cerebral vascular accident) (HCC) 01/21/2022   NSTEMI (non-ST elevated myocardial infarction) (HCC) 01/21/2022   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   HFrEF (heart failure with reduced ejection fraction) (HCC) 01/21/2022   Hyperlipidemia 01/21/2022   Lumbar stenosis 01/21/2022   Stroke (cerebrum) (HCC) 01/21/2022   S/P lumbar spinal fusion 11/15/2014   Lumbar discitis 11/29/2013   Discitis 11/24/2013   Diarrhea 11/17/2013   Osteomyelitis of lumbar spine (HCC) 11/16/2013   Hypertension    Asthma    Anxiety     ONSET DATE: 01/28/22  REFERRING DIAG: I63.9 (ICD-10-CM) - Cerebral infarction, unspecified   THERAPY DIAG:  Hemiplegia and hemiparesis following cerebral infarction affecting right dominant side (HCC)  Muscle weakness (generalized)  Other lack of coordination  Other disturbances of skin sensation  Rationale for Evaluation and Treatment Rehabilitation  SUBJECTIVE:   SUBJECTIVE STATEMENT: Pt reports that she saw Dr. Everardo Pacific, he did an injection  and is recommending PT for shoulder.   Pt accompanied by: self  PERTINENT HISTORY: PMH: HTN, cervical radiculopathy, hepatitis, osteomyelitis/discitis, lumbar stenosis with foot drop anxiety, GERD, insomnia  PRECAUTIONS: Fall  WEIGHT BEARING RESTRICTIONS  was awaiting spinal fusion surgery, limit bending, twisting, lifting >10#  PAIN:  Are you having pain? Yes: NPRS scale: 2/10 Pain location: R upper arm Pain description: sharp, aching Aggravating factors: certain movements Relieving factors: pain meds  FALLS: Has patient fallen in last 6 months? Yes. Number of falls 2  LIVING ENVIRONMENT: Lives with: lives with their  spouse Lives in: House/apartment Stairs: Yes: Internal: full flights of steps; on right going up and External: 7 steps; on left going up Has following equipment at home: Single point cane, Walker - 2 wheeled, Walker - 4 wheeled, shower chair, and bed side commode  PLOF: Independent  PATIENT GOALS I want my arm back.    OBJECTIVE:   HAND DOMINANCE: Right  ADLs: Transfers/ambulation related to ADLs: Mod I with no use of  Eating: Husband assists with cutting, feeding self with L hand Grooming: utilizing L hand with opening and brushing teeth UB Dressing: unable to manage buttons, otherwise Mod I LB Dressing: unable to tie shoes, just slipping on toes Toileting: Mod I with use of L hand Bathing: Mod I, utilizing L hand to shave Tub Shower transfers: Mod I Equipment: Shower seat with back, Walk in shower, and bed side commode   IADLs: Light housekeeping: laundry and hanging up clothes, wipes counters/sink, needs assist to fold clothing Meal Prep: no, did not cook pre-stroke Medication management: husband opens pill bottles Handwriting: unable  MOBILITY STATUS: Needs Assist: Supervision - Mod I without use of AD  POSTURE COMMENTS:  No Significant postural limitations  ACTIVITY TOLERANCE: Activity tolerance: WNL for tasks assessed on eval  FUNCTIONAL OUTCOME MEASURES: FOTO: 49  UPPER EXTREMITY ROM     Active ROM Right eval Left eval  Shoulder flexion WNL WNL  Shoulder abduction    Shoulder adduction    Shoulder extension    Shoulder internal rotation    Shoulder external rotation    Elbow flexion    Elbow extension -5 WNL  Wrist flexion 38 WNL  Wrist extension 50 WNL  Wrist ulnar deviation    Wrist radial deviation    Wrist pronation    Wrist supination    (Blank rows = not tested)   UPPER EXTREMITY MMT:     MMT Right eval Left eval  Shoulder flexion 4+/5   Shoulder abduction    Shoulder adduction    Shoulder extension    Shoulder internal rotation     Shoulder external rotation    Middle trapezius    Lower trapezius    Elbow flexion 4+/5   Elbow extension 4+/5   Wrist flexion    Wrist extension    Wrist ulnar deviation    Wrist radial deviation    Wrist pronation    Wrist supination    (Blank rows = not tested)  HAND FUNCTION: Loose gross grasp, unable to squeeze dynamometer  COORDINATION: Box and Blocks:  Right 10 blocks, Left 53 blocks Modified performance with RUE with therapist holding blocks in palm and pt able to complete gross grasp to pick up one at a time from OT palm of hand, minimal hand opening/closing 04/02/22: Right: 16 blocks  SENSATION: Light touch: WFL Stereognosis: Impaired  Hot/Cold: Impaired  Proprioception: Impaired   COGNITION: Overall cognitive status: Within functional limits for tasks assessed  VISION: Subjective report: Pt reports that her R eye does not close as well and that she feels that her L eye is compensating for her R eye. Baseline vision: Wears glasses all the time  VISION ASSESSMENT: To be further assessed in functional context  --------------------------------------------------------------------------------------------------------------------------------------------------------------------- (objective measures above completed at initial evaluation unless otherwise dated)  TODAY'S TREATMENT:  05/02/22 Coordination: picking up ping pong balls and placing 2 into hand.  Pt able to place 2 into palm with precision pinch and translation to palm, however with significant difficulty translating back to finger tips.  Pt able to place into target with one at a time, but not with translation.  OT increased challenge to picking up Chinese balls (with weight), pt demonstrating decreased motor control with increased weight, utilizing gross grasp vs precision grasp and dropping balls due to increased weight.   Theraputty: OT instructed pt in tip and 3 jaw chuck pinch as well as full grasp and  rolling of putty with focus on ROM through hand and digits as well as isolated finger movements with pinches.  Pt demonstrating decreased strength and coordination, noting decreased opposition and difficulty keeping additional fingers removed from task.  Utilized light tan for resistance due to decreased pinch.  With repetition, pt demonstrating mild increase in opposition and pinch strength and control.   04/30/22 Pt reports having consult with Dr. Everardo Pacific tomorrow about her shoulder.   Wrist flexion/extension: engaged in [facility] with hand placed on ball and rolling ball to facilitate wrist flexion/extension.  OT provided min cues and demonstration, encouraging pt to hold at end ranges 3-5 seconds for stretch.  Transitioned then to ulnar/radial deviation while on ball, then incorporating wrist circumduction clockwise and counter clockwise.  OT providing demonstration and cues to maintain forearm positioning for improved wrist movement. Block stacking: engaged in stacking of 1x1x3 foam blocks with focus on wrist flexion/extension with stacking.  OT providing min cues for forearm placement to facilitate increased wrist mobility.  Pt demonstrating improved motor control and wrist extension. Peg board: OT challenged pt to engage in pinch and rotation of pegs to place in to peg board.  Pt able to pick up large grip pegs with precision pinch, however continues to demonstrate difficulties with rotation to flip peg 180* to then place in peg board.  Pt able to do with significant effort and multiple shifts, demonstrating fatigue as task progressed.   04/23/22 Coordination: picking up Connect 4 pieces and placing in Connect 4 grid.  Focus on pinch and rotation of pieces, pt requiring use of table top to aid in rotation.  OT challenged pt to stack connect 4 pieces requiring wrist extension and precision pinch.  Pt with increased difficulty stacking, therefore OT modified task to having pt remove stacked pieces.  Pt  demonstrating min wrist extension with attempts at stacking unstacking.   Retrograde massage: OT provided retrograde massage to RUE, primarily at thumb, index, and long finger while educating on technique and completing with UE elevated to facilitate increased movement of edema.   Grip and isolated finger movements after massage.  Pt with improved thumb opposition against mild resistance of foam block and ability to nearly reach to ring finger this session.    PATIENT EDUCATION: Education details: theraputty for Marketing executive Person educated: Patient Education method: Explanation, Demonstration, and Verbal cues Education comprehension: verbalized understanding and needs further education   HOME EXERCISE PROGRAM: Fine motor coordination handout  Access Code: [email or web address] URL: [email or web address] Date: 05/02/2022 Prepared by: [facility] Outpatient  Re[facility] - [facility]  Exercises - Putty Squeezes  - 1 x daily - 1 sets - 10 reps - Rolling Putty on Table  - 1 x daily - 1 sets - 10 reps - Tip Pinch with Putty  - 1 x daily - 1 sets - 10 reps - 3-Point Pinch with Putty  - 1 x daily - 1 sets - 10 reps - Finger Pinch and Pull with Putty  - 1 x daily - 1 sets - 10 reps - Seated Digit Tendon Gliding  - 1 x daily - 1 sets - 10 reps      GOALS: Goals reviewed with patient? Yes  SHORT TERM GOALS: Target date: 05/16/22   Pt will be independent with advanced HEP for fine and gross motor control.  Baseline:  Goal status: In progress  2.   Pt will demonstrate improved UE functional use for ADLs as evidenced by increasing box/ blocks score by 4 blocks with RUE.  Baseline: R: 16 blocks and L: 53  Goal status: In progress  3.   Pt will demonstrate and verbalize safe mobility/engagement of RUE s/p rotator cuff tear during ADLs/IADLs.  Baseline:  Goal status: In progress   LONG TERM GOALS: Target date: 06/06/22  Pt will demonstrate increased hand function and grasp  to open containers (jars, medications) and verbalize understanding of AE PRN to increase ease, safety, and independence with opening containers. Baseline:  Goal status: In progress  2.  Pt will demonstrate use of BUE together to engage in functional IADL tasks (simple meal prep, folding laundry, etc). Baseline:  Goal status: In progress  3.  Pt will demonstrate increased functional reach and grasp to obtain or place light weight item from/on moderate height shelf to simulate IADLs. Baseline:  Goal status: In progress  4. Pt will increase RUE grip strength by 5# to increase success with holding and manipulating dishes and pots/pain for cooking.  Baseline:  Goal status: In progress  5.  Pt will be able to complete 9 hole peg test with RUE in 2 min time limit to demonstrate increased fine motor control as needed for ADLs/IADLs.  Baseline: able to place 2 pegs in 2 min time limit  Goal status: In progress   ASSESSMENT:  CLINICAL IMPRESSION: Treatment session with focus on movement through pain free range with primary focus on functional pinch and grasp.  Pt demonstrating difficulty with pinch and sustained grasp with items with weight, therefore engaged in strengthening with mild resistance (tan) theraputty. Pt currently limited in progress with overhead reaching due to R rotator cuff tear, therefore tasks have been more focused on coordination and hand function - plan to see ortho PT to focus on shoulder rehab.  PERFORMANCE DEFICITS in functional skills including ADLs, IADLs, coordination, dexterity, sensation, ROM, strength, FMC, GMC, balance, body mechanics, decreased knowledge of use of DME, and UE functional use and psychosocial skills including environmental adaptation, habits, and routines and behaviors.   IMPAIRMENTS are limiting patient from ADLs, IADLs, and work.   COMORBIDITIES may have co-morbidities  that affects occupational performance. Patient will benefit from skilled OT to  address above impairments and improve overall function.  MODIFICATION OR ASSISTANCE TO COMPLETE EVALUATION: Min-Moderate modification of tasks or assist with assess necessary to complete an evaluation.  OT OCCUPATIONAL PROFILE AND HISTORY: Detailed assessment: Review of records and additional review of physical, cognitive, psychosocial history related to current functional performance.  CLINICAL DECISION MAKING: Moderate - several treatment options, min-mod task  modification necessary  REHAB POTENTIAL: Good  EVALUATION COMPLEXITY: Moderate    PLAN: OT FREQUENCY: 2x/week  OT DURATION: 6 weeks  PLANNED INTERVENTIONS: self care/ADL training, therapeutic exercise, therapeutic activity, neuromuscular re-education, manual therapy, passive range of motion, balance training, functional mobility training, splinting, electrical stimulation, ultrasound, compression bandaging, moist heat, cryotherapy, patient/family education, psychosocial skills training, energy conservation, coping strategies training, and DME and/or AE instructions  RECOMMENDED OTHER SERVICES: N/A  CONSULTED AND AGREED WITH PLAN OF CARE: Patient and family member/caregiver  PLAN FOR NEXT SESSION: to see ortho PT for shoulder rehab - being followed by Dr. Everardo Pacific  Engage in wrist ROM and hand opening/closing and attempt finger isolation exercises/activities. Gross grasp and reach activities.  Review theraputty exercises.  Massage (as appropriate) and tone management.   Rosalio Loud, OTR/L 05/02/2022, 9:44 AM

## 2022-05-02 NOTE — Progress Notes (Signed)
Subjective:    Patient ID: Chloe Johnson, female    DOB: [birth year], 66 y.o.   MRN: [id number]  HPI:  An audio/video tele-health visit is felt to be the most appropriate encounter for this patient at this time. This is a follow up tele-visit via phone. The patient is at home. MD is at office. Prior to scheduling this appointment, our staff discussed the limitations of evaluation and management by telemedicine and the availability of in-person appointments. The patient expressed understanding and agreed to proceed.   Chloe Johnson is a 66 y.o. female who is here for f/u of right shoulder pain, Left Middle Cerebral Artery Stroke, Left Ventricular Thrombus and Essential Hypertension.   Dr. Iver Johnson H&P on 01/28/2022 HPI: Chloe Johnson is a 66 y.o. 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 Amada Johnson, with key details confirmed by husband Chloe Johnson  is a 75 y.o.  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 She presents today after the following series events.  Around 12 PM this past Saturday, the patient noted sudden onset dizziness described as "vertigo" followed by nausea then followed by an episode of vomiting.  She next felt a sudden onset sharp chest pain with radiation to her back.  She assumed it was her cervical radiculopathy "acting up" because shortly after this event, she was holding 2 plates in her hand to feed her cats when  she suddenly had no control of both upper extremities and dropped said plates.  On Sunday, she felt generally weak but then improved from all of this until this morning, around 9 AM, when she experienced word finding difficulties.  Her husband, who is at bedside, also notes she had a right facial droop transiently that lasted no more than a few minutes.  She may have had 2 subsequent episodes, as documented by ED physician; however, all of her is told by the patient's husband that this was 1 single episode."   Her hospital course was noted for acute worsening for which she underwent thrombectomy and was discharged with only some very mild residual aphasia not detectable on examination but noticed by the patient per husband.  He notes that she has been in her normal state of health since discharge other than a visit to the ED on 9/3 with left shoulder pain which was attributed to her known cervical stenosis for which she is planned for surgery in the near future   Husband reports that she was taking his blood pressure when suddenly the blood pressure monitor fell off the table.  She tried to catch it but lost her balance had weakness in both of her legs.  Initially she got to both both arms but then became weak on the right side.  She was unable to speak at all suddenly during this event.  Work-up revealed a left ventricle apical thrombus.   Has been reports she has not missed any of her doses of medication including Eliquis   CTA was attempted to confirm recurrent acute occlusion, however due to IV infiltration and severity of her  symptoms decision was made to proceed directly to IR which was discussed both with Dr. Estanislado Johnson as well as with the patient's husband   MR Brain:  IMPRESSION: 1. 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 2. No intracranial large vessel occlusion or significant stenosis. In particular, left MCA branches appear perfused and without  focal stenosis.  MRA:  IMPRESSION: 1. 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 2. No intracranial large vessel occlusion or significant stenosis. In particular, left MCA branches appear perfused and without focal stenosis.  Dr. Treasa Johnson COURSE Stroke:  left MCA punctate scattered infarct with left M2 occlusion s/p IR with [id number], embolic secondary to LV thrombus recently on Eliquis CT no acute abnormality IR left superior M2 occlusion, inferior M3/M4 nonocclusive thrombus Status post EVT with TICI2c MRI left insular cortex and frontal lobe punctate infarcts MRA unremarkable 2D Echo EF 45 to 50% on 8/29, LV regional motion abnormality with LV thrombus LDL 150 on 8/29 HgbA1c 5 0 on 8/29 UDS positive for THC Hypercoagulable and autoimmune work up negative Heparin IV for VTE prophylaxis aspirin 81 mg daily and Eliquis (apixaban) daily prior to admission,  now on coumadin with lovenox bridge. INR goal 2.5 Continue ASA 81 per cardiology. Ongoing aggressive stroke risk factor management Therapy recommendations:  CIR Disposition:  pending   Hx of stroke and LV thrombus 8/29 admitted for nausea vomiting dizziness chest pain and then developed right facial droop and aphasia.  Found to have troponin > 300, EF 45 to 50% with LV thrombus.  CTA coronary artery showed minimal CAD.  CT no acute abnormality.  CTA head and neck showed left M2 occlusion.  CTP 0/7.  Status post EVT with TICI2c.  MRI showed left frontal Cazier matter punctate infarct.  Repeat MRI showed left external capsule, insular cortex and frontal cortex punctate infarcts.  LDL 150, A1c 5.0.  UDS positive for THC.  Patient was put on heparin IV and aspirin and eventually discharged on Eliquis and aspirin as well as Crestor 20. Per family, patient has slight expressive aphasia since discharge, no physical deficit. Cardiology Dr. Johnsie Johnson on board, recommend coumadin with INR goal 2.5. on  lovenox bridge.    Chloe Johnson was admitted to inpatient rehabilitation on 02/04/2022 and discharged home on 02/07/2022. She will begin outpatient Therapy on 02/25/2022 at [facility].  Ms. Pratts has expressive aphasia.  She was positive with THC, she reports she uses CBD products for anxiety, this was discussed with Dr Ranell Patrick. Dr Ranell Patrick stated she can continue with her CBD product, she verbalizes understanding.   1) Right shoulder pain  -started suddenly when she was doing an activity (history is limited by her aphasia) -she feels this pain is more than just spasticity -it is very severe -she has been doing better with the increase in Lyrica to 200mg  BID -XR discussed and she would like to get this -she prefers nonsurgical treatment if possible.  -she discussed with Dr. Griffin Basil and surgery was not recommended due to risk of re-tear -she is scheduled for steroid injection -she would be interested in starting topamax which we discussed before  2) Aphasia is greatly improving and she continues working with therapy  Pain Inventory Average Pain 4 Pain Right Now 4 My pain is constant and aching  LOCATION OF PAIN  Neck,Wrist,Hand,Back  BOWEL Number of stools per week: 14  Type of laxative Super Cleanse  BLADDER Normal  Mobility walk without assistance how many minutes can you walk? As needed ability to climb steps?  yes do you drive?  no Do you have any goals in this area?  yes  Function employed # of hrs/week 70  Neuro/Psych bowel control problems weakness numbness anxiety  Prior Studies Any changes since last visit?  no  Physicians involved in your care Any changes since last visit?  no   Family History  Problem Relation Age of Onset   Other Mother    Other Father    Hypertension Father    Social History   Socioeconomic History   Marital status: Married    Spouse name: Leonette Most   Number of children: 3   Years of education:  RN   Highest education level: Not on file  Occupational History    Employer: [employer]   Tobacco Use   Smoking status: Never   Smokeless tobacco: Never  Vaping Use   Vaping Use: Never used  Substance and Sexual Activity   Alcohol use: Yes    Alcohol/week: 6.0 standard drinks of alcohol    Types: 3 Glasses of wine, 3 Shots of liquor per week   Drug use: No   Sexual activity: Yes    Birth control/protection: Post-menopausal  Other Topics Concern   Not on file  Social History Narrative   Patient is married and lives at home with her husband Leonette Most).  Patient works full time as a Charity fundraiser.   Right handed.   College education.   Social Determinants of Health   Financial Resource Strain: Not on file  Food Insecurity: Not on file  Transportation Needs: Not on file  Physical Activity: Not on file  Stress: Not on file  Social Connections: Not on file   Past Surgical History:  Procedure Laterality Date   BREAST CYST EXCISION Left 2010   "polypectomy"   IR CT HEAD LTD  01/21/2022   IR CT HEAD LTD  01/28/2022   IR PERCUTANEOUS ART THROMBECTOMY/INFUSION INTRACRANIAL INC DIAG ANGIO  01/21/2022   IR PERCUTANEOUS ART THROMBECTOMY/INFUSION INTRACRANIAL INC DIAG ANGIO  01/28/2022   IR US GUIDE VASC ACCESS RIGHT  01/22/2022   PICC LINE PLACE PERIPHERAL ([facility] HX) Right    for use of Levaquin & Vancomycin, of note: she reports that she had a "flulike feeling"    RADIOLOGY WITH ANESTHESIA N/A 01/21/2022   Procedure: IR WITH ANESTHESIA;  Surgeon: Julieanne Cotton, MD;  Location: [facility] OR;  Service: Radiology;  Laterality: N/A;   RADIOLOGY WITH ANESTHESIA N/A 01/28/2022   Procedure: IR WITH ANESTHESIA;  Surgeon: Radiologist, Medication, MD;  Location: [facility] OR;  Service: Radiology;  Laterality: N/A;   TONSILLECTOMY AND ADENOIDECTOMY  1975   TUBAL LIGATION  1999   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/16/2013   L3-4/notes 11/24/2013, arms,  neck   Exertional asthma    GERD (gastroesophageal reflux disease)    Hypertension    Kidney stones    "have always passed them"   Neck pain    Osteomyelitis (HCC) 11/23/2013   osteomyelitis, discitis    Sleep concern    uses Prozac for sleep    There were no vitals taken for this visit.  Opioid Risk Score:   Fall Risk Score:  `1  Depression screen [facility] 2/9     02/20/2022    9:22 AM 02/15/2014   11:06 AM 01/18/2014  2:15 PM 12/23/2013   10:14 AM  Depression screen PHQ 2/9  Decreased Interest 0 0 0 0  Down, Depressed, Hopeless 0 1 0 0  PHQ - 2 Score 0 1 0 0  Altered sleeping 0     Tired, decreased energy 1     Change in appetite 0     Feeling bad or failure about yourself  0     Trouble concentrating 0     Moving slowly or fidgety/restless 0     Suicidal thoughts 0     PHQ-9 Score 1     Difficult doing work/chores Not difficult at all         Review of Systems  Musculoskeletal:  Positive for back pain and neck pain.       Wrist and hand pain   All other systems reviewed and are negative.      Objective:   Not performed       Assessment & Plan:  Left Middle Cerebral Artery Stroke: Continue current medication regimen. Has a scheduled appointment with Neurology. She will begin outpatient therapy at [place]. Continue to Monitor. Continue speech therapy to continue working on aphasia Left Ventricular Thrombus: Continue current medication regimen. Cardiology Following.  Essential Hypertension. Continue current medication regimen. PCP Following. Continue to Monitor.  Right shoulder pain: Discussed that MRI shows complete tear, referred to ortho Discussed extracorporeal shockwave therapy as a modality for treatment. Discussed that the device looks and feels like a massage gun and I would move it over the area of pain for about 10 minutes. The device releases sound waves to the area of pain and helps to improve blood flow and circulation to improve  the healing process. Discuss that this initially induces inflammation and can sometimes cause short-term increase in pain. Discussed that we typically do three weekly treatments, but sometimes up to 6 if needed, and after 6 weeks long term benefits can sometimes be achieved.  Lyrica increased to 200mg  BID, discussed that she can continue this dose if she wants but given her unwanted weight gain we can alternatively replace her morning dose with low dose naltrexone. She preferred to try this latter option. -discussed mechanism of action of low dose naltrexone as an opioid receptor antagonist which stimulates your body's production of its own natural endogenous opioids, helping to decrease pain. Discussed that it can also decrease T cell response and thus be helpful in decreasing inflammation, and symptoms of brain fog, fatigue, anxiety, depression, and allergies. Discussed that this medication needs to be compounded at a compounding pharmacy and can more expensive. Discussed that I usually start at 1mg  and if this is not providing enough relief then I titrate upward on a monthly basis.   Prescribed topamax to help improve sleep and pain, discussed that it can also curb appetite  6 minutes spent in discussion of starting topamax to help with sleep, pain, and to curb appetite.

## 2022-05-05 ENCOUNTER — Inpatient Hospital Stay: Payer: Federal, State, Local not specified - PPO | Attending: Hematology & Oncology

## 2022-05-05 ENCOUNTER — Other Ambulatory Visit: Payer: Self-pay

## 2022-05-05 ENCOUNTER — Inpatient Hospital Stay: Payer: Federal, State, Local not specified - PPO | Admitting: Hematology & Oncology

## 2022-05-05 ENCOUNTER — Ambulatory Visit: Payer: Federal, State, Local not specified - PPO

## 2022-05-05 ENCOUNTER — Encounter: Payer: Self-pay | Admitting: Hematology & Oncology

## 2022-05-05 VITALS — BP 101/73 | HR 59 | Temp 98.4°F | Resp 18 | Ht 60.0 in | Wt 141.1 lb

## 2022-05-05 DIAGNOSIS — Z7901 Long term (current) use of anticoagulants: Secondary | ICD-10-CM | POA: Diagnosis not present

## 2022-05-05 DIAGNOSIS — I6300 Cerebral infarction due to thrombosis of unspecified precerebral artery: Secondary | ICD-10-CM

## 2022-05-05 DIAGNOSIS — R531 Weakness: Secondary | ICD-10-CM | POA: Diagnosis not present

## 2022-05-05 DIAGNOSIS — I63511 Cerebral infarction due to unspecified occlusion or stenosis of right middle cerebral artery: Secondary | ICD-10-CM

## 2022-05-05 DIAGNOSIS — R278 Other lack of coordination: Secondary | ICD-10-CM | POA: Diagnosis not present

## 2022-05-05 DIAGNOSIS — D6859 Other primary thrombophilia: Secondary | ICD-10-CM

## 2022-05-05 DIAGNOSIS — Z7289 Other problems related to lifestyle: Secondary | ICD-10-CM | POA: Diagnosis not present

## 2022-05-05 DIAGNOSIS — Z8249 Family history of ischemic heart disease and other diseases of the circulatory system: Secondary | ICD-10-CM | POA: Diagnosis not present

## 2022-05-05 DIAGNOSIS — R482 Apraxia: Secondary | ICD-10-CM

## 2022-05-05 DIAGNOSIS — R4701 Aphasia: Secondary | ICD-10-CM | POA: Diagnosis not present

## 2022-05-05 DIAGNOSIS — M6281 Muscle weakness (generalized): Secondary | ICD-10-CM | POA: Diagnosis not present

## 2022-05-05 DIAGNOSIS — R471 Dysarthria and anarthria: Secondary | ICD-10-CM | POA: Diagnosis not present

## 2022-05-05 DIAGNOSIS — I10 Essential (primary) hypertension: Secondary | ICD-10-CM

## 2022-05-05 DIAGNOSIS — R208 Other disturbances of skin sensation: Secondary | ICD-10-CM | POA: Diagnosis not present

## 2022-05-05 DIAGNOSIS — I69351 Hemiplegia and hemiparesis following cerebral infarction affecting right dominant side: Secondary | ICD-10-CM | POA: Diagnosis not present

## 2022-05-05 LAB — CBC WITH DIFFERENTIAL (CANCER CENTER ONLY)
Abs Immature Granulocytes: 0.06 10*3/uL (ref 0.00–0.07)
Basophils Absolute: 0 10*3/uL (ref 0.0–0.1)
Basophils Relative: 0 %
Eosinophils Absolute: 0 10*3/uL (ref 0.0–0.5)
Eosinophils Relative: 0 %
HCT: 42.1 % (ref 36.0–46.0)
Hemoglobin: 13.2 g/dL (ref 12.0–15.0)
Immature Granulocytes: 1 %
Lymphocytes Relative: 18 %
Lymphs Abs: 1.2 10*3/uL (ref 0.7–4.0)
MCH: 28 pg (ref 26.0–34.0)
MCHC: 31.4 g/dL (ref 30.0–36.0)
MCV: 89.2 fL (ref 80.0–100.0)
Monocytes Absolute: 0.5 10*3/uL (ref 0.1–1.0)
Monocytes Relative: 7 %
Neutro Abs: 4.8 10*3/uL (ref 1.7–7.7)
Neutrophils Relative %: 74 %
Platelet Count: 197 10*3/uL (ref 150–400)
RBC: 4.72 MIL/uL (ref 3.87–5.11)
RDW: 11.8 % (ref 11.5–15.5)
WBC Count: 6.5 10*3/uL (ref 4.0–10.5)
nRBC: 0 % (ref 0.0–0.2)

## 2022-05-05 LAB — CMP (CANCER CENTER ONLY)
ALT: 13 U/L (ref 0–44)
AST: 10 U/L — ABNORMAL LOW (ref 15–41)
Albumin: 4.5 g/dL (ref 3.5–5.0)
Alkaline Phosphatase: 74 U/L (ref 38–126)
Anion gap: 9 (ref 5–15)
BUN: 19 mg/dL (ref 8–23)
CO2: 22 mmol/L (ref 22–32)
Calcium: 9.6 mg/dL (ref 8.9–10.3)
Chloride: 109 mmol/L (ref 98–111)
Creatinine: 0.79 mg/dL (ref 0.44–1.00)
GFR, Estimated: >60 mL/min (ref >60)
Glucose, Bld: 154 mg/dL — ABNORMAL HIGH (ref 70–99)
Potassium: 3.9 mmol/L (ref 3.5–5.1)
Sodium: 140 mmol/L (ref 135–145)
Total Bilirubin: 0.3 mg/dL (ref 0.3–1.2)
Total Protein: 7.1 g/dL (ref 6.5–8.1)

## 2022-05-05 LAB — PROTIME-INR
INR: 2.5 — ABNORMAL HIGH (ref 0.8–1.2)
Prothrombin Time: 27 seconds — ABNORMAL HIGH (ref 11.4–15.2)

## 2022-05-05 NOTE — Progress Notes (Signed)
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  HPI: Chloe Johnson is a very nice 66 year old [place] female.  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  She subsequently went to work for [employer].  She has been healthy her whole life.  She has 3 children.  She has never had miscarriages.  She had regular deliveries.  She has had back surgery.  She says she has always been a "easy bleeder."  She subsequently had a MRI that was complicated by left ventricular thrombus.  She had a right middle cerebral artery infarct.  She was on Eliquis.  Back in August, she had a echocardiogram that showed an ejection fraction of 40-45%.  A repeat echocardiogram in October showed ejection fraction of 60-65%.  In September, she just got back from [place].  She apparently was feeding her cats.  She could not hold the canned Food.  She also had a trouble speaking.  She subsequently came to the emergency room.  She had right-sided weakness and aphasia.  She was found to have on MRI of a new left posterior frontal and anterior parietal cortex infarct.  She had bilateral arteriograms.  This showed occlusion of the left MCA artery and the M2 region.  She subsequently underwent a mechanical thrombectomy.  She subsequently was placed on 2 heparin and then Coumadin.  She is being followed by the Coumadin clinic.  She did undergo thrombophilic workup.  In November, she was found to have a marked decrease in her protein S level.  Her total Protein S was 32%.  Her functional Protein S was 20%.  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  She tested negative for the lupus anticoagulant.  She was calmly referred to the [facility] for [facility].  We did do her INR today.  It was 2.5.  She does not have  any Family history of strokes or cardiac events.  There is no history of tobacco use.  She is not on any count of postmenopausal estrogens.  She has been active.  She has had no fever.  There is been no problems with COVID.  Currently, she does have some difficulty with speech.  There is an element of aphasia.  However, she is very patient and does get out the words.  She is getting a lot of physical therapy.  She can do occupational therapy and speech therapy.  Currently, I would have to say that her performance status is probably ECOG 1.   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/16/2013   L3-4/notes 11/24/2013, arms, neck   Exertional asthma    GERD (gastroesophageal reflux disease)    Hypertension    Kidney stones    "have always passed them"   Neck pain    Osteomyelitis (HCC) 11/23/2013   osteomyelitis, discitis    Sleep concern    uses Prozac for sleep   :   Past Surgical History:  Procedure Laterality Date   BREAST CYST EXCISION Left 2010   "polypectomy"   IR CT HEAD LTD  01/21/2022   IR CT HEAD LTD  01/28/2022   IR PERCUTANEOUS ART THROMBECTOMY/INFUSION INTRACRANIAL INC DIAG ANGIO  01/21/2022   IR PERCUTANEOUS  ART THROMBECTOMY/INFUSION INTRACRANIAL INC DIAG ANGIO  01/28/2022   IR US GUIDE VASC ACCESS RIGHT  01/22/2022   PICC LINE PLACE PERIPHERAL ([facility] HX) Right    for use of Levaquin & Vancomycin, of note: she reports that she had a "flulike feeling"    RADIOLOGY WITH ANESTHESIA N/A 01/21/2022   Procedure: IR WITH ANESTHESIA;  Surgeon: Julieanne Cotton, MD;  Location: [facility] OR;  Service: Radiology;  Laterality: N/A;   RADIOLOGY WITH ANESTHESIA N/A 01/28/2022   Procedure: IR WITH ANESTHESIA;  Surgeon: Radiologist, Medication, MD;  Location: [facility] OR;  Service: Radiology;  Laterality: N/A;   TONSILLECTOMY AND ADENOIDECTOMY  1975   TUBAL LIGATION  1999  :   Current Outpatient Medications:    acetaminophen (TYLENOL) 325 MG  tablet, Take 2 tablets (650 mg total) by mouth every 4 (four) hours as needed for mild pain (or temp > 37.5 C (99.5 F))., Disp: , Rfl:    cyclobenzaprine (FLEXERIL) 10 MG tablet, Take 1 tablet (10 mg total) by mouth at bedtime., Disp: 30 tablet, Rfl: 3   famotidine (PEPCID) 20 MG tablet, Take 1 tablet (20 mg total) by mouth 2 (two) times daily., Disp: 180 tablet, Rfl: 3   ferrous sulfate 325 (65 FE) MG tablet, Take 1 tablet (325 mg total) by mouth daily with breakfast., Disp: 100 tablet, Rfl: 3   loratadine (CLARITIN) 10 MG tablet, Take 1 tablet (10 mg total) by mouth daily., Disp: , Rfl:    metoprolol succinate (TOPROL-XL) 25 MG 24 hr tablet, Take 0.5 tablets (12.5 mg total) by mouth daily., Disp: 45 tablet, Rfl: 3   mirabegron ER (MYRBETRIQ) 50 MG TB24 tablet, Take 25 mg by mouth daily., Disp: , Rfl:    potassium chloride (KLOR-CON) 10 MEQ tablet, Take 2 tablets by mouth twice daily, Disp: 360 tablet, Rfl: 2   pregabalin (LYRICA) 200 MG capsule, Take 1 capsule (200 mg total) by mouth 2 (two) times daily., Disp: 180 capsule, Rfl: 3   rosuvastatin (CRESTOR) 20 MG tablet, Take 1 tablet (20 mg total) by mouth daily., Disp: 90 tablet, Rfl: 3   sacubitril-valsartan (ENTRESTO) 24-26 MG, Take 1 tablet by mouth 2 (two) times daily., Disp: 180 tablet, Rfl: 3   topiramate (TOPAMAX) 25 MG tablet, Take 1 tablet (25 mg total) by mouth daily as needed., Disp: 90 tablet, Rfl: 3   warfarin (COUMADIN) 4 MG tablet, Take 1 tablet (4 mg total) by mouth daily., Disp: 30 tablet, Rfl: 11:  :   Allergies  Allergen Reactions   Doxycycline Nausea Only    malaise    Erythromycin Nausea And Vomiting   Iodine     unknown   Vancomycin    Vicodin [Hydrocodone-Acetaminophen] Other (See Comments)    Restless, tolerate percocet   Dilaudid [Hydromorphone Hcl] Rash    *Pt states she can take if given benadryl prior*   Keflex [Cephalexin] Rash   Penicillins Rash    Hives   Sulfa Antibiotics Rash    Hives   :   Family History  Problem Relation Age of Onset   Other Mother    Other Father    Hypertension Father   :   Social History   Socioeconomic History   Marital status: Married    Spouse name: Leonette Most   Number of children: 3   Years of education: RN   Highest education level: Not on file  Occupational History    Employer: [employer]   Tobacco Use   Smoking status: Never  Smokeless tobacco: Never  Vaping Use   Vaping Use: Never used  Substance and Sexual Activity   Alcohol use: Yes    Alcohol/week: 6.0 standard drinks of alcohol    Types: 3 Glasses of wine, 3 Shots of liquor per week   Drug use: No   Sexual activity: Yes    Birth control/protection: Post-menopausal  Other Topics Concern   Not on file  Social History Narrative   Patient is married and lives at home with her husband Leonette Most).  Patient works full time as a Charity fundraiser.   Right handed.   College education.   Social Determinants of Health   Financial Resource Strain: Not on file  Food Insecurity: Not on file  Transportation Needs: Not on file  Physical Activity: Not on file  Stress: Not on file  Social Connections: Not on file  Intimate Partner Violence: Not on file  :  Review of Systems  Constitutional: Negative.   HENT: Negative.    Eyes: Negative.   Respiratory: Negative.    Cardiovascular: Negative.   Gastrointestinal: Negative.   Genitourinary: Negative.   Musculoskeletal: Negative.   Skin: Negative.   Neurological:  Positive for speech change and focal weakness.  Endo/Heme/Allergies: Negative.   Psychiatric/Behavioral: Negative.       Exam: Vital signs show temperature of 98.4.  Pulse 59.  Blood pressure 101/73.  Weight is 141 pounds. @IPVITALS @  Physical Exam Vitals reviewed.  HENT:     Head: Normocephalic and atraumatic.  Eyes:     Pupils: Pupils are equal, round, and reactive to light.  Cardiovascular:     Rate and Rhythm: Normal rate and regular rhythm.     Heart  sounds: Normal heart sounds.  Pulmonary:     Effort: Pulmonary effort is normal.     Breath sounds: Normal breath sounds.  Abdominal:     General: Bowel sounds are normal.     Palpations: Abdomen is soft.  Musculoskeletal:        General: No tenderness or deformity. Normal range of motion.     Cervical back: Normal range of motion.  Lymphadenopathy:     Cervical: No cervical adenopathy.  Skin:    General: Skin is warm and dry.     Findings: No erythema or rash.  Neurological:     Mental Status: She is alert and oriented to person, place, and time.     Comments: Neurological exam does show some speech alteration.  She has little bit of aphasia.  She clearly understands what she was saying to her.  She does speak slowly but does answer appropriately.  Psychiatric:        Behavior: Behavior normal.        Thought Content: Thought content normal.        Judgment: Judgment normal.     Recent Labs    05/05/22 1349  WBC 6.5  HGB 13.2  HCT 42.1  PLT 197    Recent Labs    05/05/22 1349  NA 140  K 3.9  CL 109  CO2 22  GLUCOSE 154*  BUN 19  CREATININE 0.79  CALCIUM 9.6    Blood smear review: None  Pathology: None    Assessment and Plan: Ms. Gatta is a very nice 67 year old [place] female.  She has had recurrent thromboembolic disease.  She was found to have a Prothrombin 2 gene mutation and also Protein S deficiency.  I am surprised that she has never had problems before.  She gave  birth to 3 children.  She has had no miscarriages.  She has had past surgery.  Regardless, she clearly will be on lifelong anticoagulation.  I told her that there is nothing that we can do to change the fact that she has these abnormalities.  I just do not think that there is anything rheumatologic that is involved.  She talk to me about having a rash at 1 point.  She is being followed by the Coumadin clinic.  I clearly would keep her INR between 2.5-3.5.  I would like to hope that she  will improve her speech.  Again she has aphasia but she really is doing nicely.  She is very deliberate with her speech.  However, she clearly knows what she wants to say.  She obviously is incredibly intelligent.  It was delightful talking with her.  From my point of view, there really is not much that we can do.  I think we can learn from this is that we probably need to have her children tested.  I wrote down the 2 test that need to be done on her children.  I would think that their family doctors can run these studies.  I really do not think we have to get her back to see [place].  I did give her a prayer blanket.  She is very thankful for this.
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
## 2022-05-06 ENCOUNTER — Ambulatory Visit: Payer: Federal, State, Local not specified - PPO | Admitting: Occupational Therapy

## 2022-05-06 ENCOUNTER — Ambulatory Visit: Payer: Federal, State, Local not specified - PPO | Attending: Cardiovascular Disease | Admitting: *Deleted

## 2022-05-06 DIAGNOSIS — M6281 Muscle weakness (generalized): Secondary | ICD-10-CM | POA: Diagnosis not present

## 2022-05-06 DIAGNOSIS — I513 Intracardiac thrombosis, not elsewhere classified: Secondary | ICD-10-CM

## 2022-05-06 DIAGNOSIS — R482 Apraxia: Secondary | ICD-10-CM | POA: Diagnosis not present

## 2022-05-06 DIAGNOSIS — I69351 Hemiplegia and hemiparesis following cerebral infarction affecting right dominant side: Secondary | ICD-10-CM | POA: Diagnosis not present

## 2022-05-06 DIAGNOSIS — R471 Dysarthria and anarthria: Secondary | ICD-10-CM | POA: Diagnosis not present

## 2022-05-06 DIAGNOSIS — R208 Other disturbances of skin sensation: Secondary | ICD-10-CM | POA: Diagnosis not present

## 2022-05-06 DIAGNOSIS — R278 Other lack of coordination: Secondary | ICD-10-CM | POA: Diagnosis not present

## 2022-05-06 DIAGNOSIS — R4701 Aphasia: Secondary | ICD-10-CM | POA: Diagnosis not present

## 2022-05-06 DIAGNOSIS — I639 Cerebral infarction, unspecified: Secondary | ICD-10-CM | POA: Diagnosis not present

## 2022-05-06 LAB — LUPUS ANTICOAGULANT PANEL
DRVVT: 51.5 s — ABNORMAL HIGH (ref 0.0–47.0)
PTT Lupus Anticoagulant: 31.9 s (ref 0.0–43.5)

## 2022-05-06 LAB — CARDIOLIPIN ANTIBODIES, IGG, IGM, IGA
Anticardiolipin IgA: <9 APL U/mL (ref 0–11)
Anticardiolipin IgG: <9 GPL U/mL (ref 0–14)
Anticardiolipin IgM: <9 MPL U/mL (ref 0–12)

## 2022-05-06 LAB — DRVVT MIX: dRVVT Mix: 37.6 s (ref 0.0–40.4)

## 2022-05-06 LAB — POCT INR: INR: 2.7 (ref 2.0–3.0)

## 2022-05-06 NOTE — Therapy (Signed)
OUTPATIENT OCCUPATIONAL THERAPY  Treatment Note  Patient Name: Chloe Johnson MRN: [id number] DOB:1956-04-23, 66 y.o., female Today's Date: 05/06/2022  PCP: Charlane Ferretti, MD REFERRING PROVIDER: Izora Ribas, MD    OT End of Session - 05/06/22 1104     Visit Number 16    Number of Visits 25    Date for OT Re-Evaluation 06/06/22    Authorization Type [employer]    OT Start Time 1104    OT Stop Time 1146    OT Time Calculation (min) 42 min                        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/16/2013   L3-4/notes 11/24/2013, arms, neck   Exertional asthma    GERD (gastroesophageal reflux disease)    Hypertension    Kidney stones    "have always passed them"   Neck pain    Osteomyelitis ([place]) 11/23/2013   osteomyelitis, discitis    Sleep concern    uses Prozac for sleep    Past Surgical History:  Procedure Laterality Date   BREAST CYST EXCISION Left 2010   "polypectomy"   IR CT HEAD LTD  01/21/2022   IR CT HEAD LTD  01/28/2022   IR PERCUTANEOUS ART THROMBECTOMY/INFUSION INTRACRANIAL INC DIAG ANGIO  01/21/2022   IR PERCUTANEOUS ART THROMBECTOMY/INFUSION INTRACRANIAL INC DIAG ANGIO  01/28/2022   IR US GUIDE VASC ACCESS RIGHT  01/22/2022   PICC LINE PLACE PERIPHERAL ([place] [facility]) Right    for use of Levaquin & Vancomycin, of note: she reports that she had a "flulike feeling"    RADIOLOGY WITH ANESTHESIA N/A 01/21/2022   Procedure: IR WITH ANESTHESIA;  Surgeon: Luanne Bras, MD;  Location: [facility];  Service: Radiology;  Laterality: N/A;   RADIOLOGY WITH ANESTHESIA N/A 01/28/2022   Procedure: IR WITH ANESTHESIA;  Surgeon: Radiologist, Medication, MD;  Location: [facility];  Service: Radiology;  Laterality: N/A;   [place]   Patient Active Problem List   Diagnosis Date Noted   Aphasia 03/31/2022   Snores 03/31/2022   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    UTI (urinary tract infection) 02/04/2022   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   Stroke determined by clinical assessment ([place]) 01/28/2022   Middle cerebral artery embolism, left 01/28/2022   H/O ischemic left MCA stroke    Acute respiratory failure (HCC)    CVA (cerebral vascular accident) ([place]) 01/21/2022   NSTEMI (non-ST elevated myocardial infarction) ([place]) 01/21/2022   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   HFrEF (heart failure with reduced ejection fraction) ([place]) 01/21/2022   Hyperlipidemia 01/21/2022   Lumbar stenosis 01/21/2022   Stroke (cerebrum) ([place]) 01/21/2022   S/P lumbar spinal fusion 11/15/2014   Lumbar discitis 11/29/2013   Discitis 11/24/2013   Diarrhea 11/17/2013   Osteomyelitis of lumbar spine ([place]) 11/16/2013   Hypertension    Asthma    Anxiety     ONSET DATE: 01/28/22  REFERRING DIAG: I63.9 (ICD-10-CM) - Cerebral infarction, unspecified   THERAPY DIAG:  Hemiplegia and hemiparesis following cerebral infarction affecting right dominant side (HCC)  Muscle weakness (generalized)  Other lack of coordination  Other disturbances of skin sensation  Rationale for Evaluation and Treatment Rehabilitation  SUBJECTIVE:   SUBJECTIVE STATEMENT: Pt reports that she put the lights on the Christmas tree,  which was frustrating but a good activity.   Pt accompanied by: self  PERTINENT HISTORY: PMH: HTN, cervical radiculopathy, hepatitis, osteomyelitis/discitis, lumbar stenosis with foot drop anxiety, GERD, insomnia  PRECAUTIONS: Fall  WEIGHT BEARING RESTRICTIONS  was awaiting spinal fusion surgery, limit bending, twisting, lifting >10#  PAIN:  Are you having pain? No  FALLS: Has patient fallen in last 6 months? Yes. Number of falls 2  LIVING ENVIRONMENT: Lives with: lives with their spouse Lives in: House/apartment Stairs: Yes: Internal: full flights of steps; on right going up and External: 7 steps; on left going up Has following  equipment at home: Single point cane, Walker - 2 wheeled, Walker - 4 wheeled, shower chair, and bed side commode  PLOF: Independent  PATIENT GOALS I want my arm back.    OBJECTIVE:   HAND DOMINANCE: Right  ADLs: Transfers/ambulation related to ADLs: Mod I with no use of  Eating: Husband assists with cutting, feeding self with L hand Grooming: utilizing L hand with opening and brushing teeth UB Dressing: unable to manage buttons, otherwise Mod I LB Dressing: unable to tie shoes, just slipping on toes Toileting: Mod I with use of L hand Bathing: Mod I, utilizing L hand to shave Tub Shower transfers: Mod I Equipment: Shower seat with back, Walk in shower, and bed side commode   IADLs: Light housekeeping: laundry and hanging up clothes, wipes counters/sink, needs assist to fold clothing Meal Prep: no, did not cook pre-stroke Medication management: husband opens pill bottles Handwriting: unable  MOBILITY STATUS: Needs Assist: Supervision - Mod I without use of AD  POSTURE COMMENTS:  No Significant postural limitations  ACTIVITY TOLERANCE: Activity tolerance: WNL for tasks assessed on eval  FUNCTIONAL OUTCOME MEASURES: FOTO: 49  UPPER EXTREMITY ROM     Active ROM Right eval Left eval  Shoulder flexion WNL WNL  Shoulder abduction    Shoulder adduction    Shoulder extension    Shoulder internal rotation    Shoulder external rotation    Elbow flexion    Elbow extension -5 WNL  Wrist flexion 38 WNL  Wrist extension 50 WNL  Wrist ulnar deviation    Wrist radial deviation    Wrist pronation    Wrist supination    (Blank rows = not tested)   UPPER EXTREMITY MMT:     MMT Right eval Left eval  Shoulder flexion 4+/5   Shoulder abduction    Shoulder adduction    Shoulder extension    Shoulder internal rotation    Shoulder external rotation    Middle trapezius    Lower trapezius    Elbow flexion 4+/5   Elbow extension 4+/5   Wrist flexion    Wrist extension     Wrist ulnar deviation    Wrist radial deviation    Wrist pronation    Wrist supination    (Blank rows = not tested)  HAND FUNCTION: Loose gross grasp, unable to squeeze dynamometer  COORDINATION: Box and Blocks:  Right 10 blocks, Left 53 blocks Modified performance with RUE with therapist holding blocks in palm and pt able to complete gross grasp to pick up one at a time from OT palm of hand, minimal hand opening/closing 04/02/22: Right: 16 blocks  SENSATION: Light touch: WFL Stereognosis: Impaired  Hot/Cold: Impaired  Proprioception: Impaired   COGNITION: Overall cognitive status: Within functional limits for tasks assessed  VISION: Subjective report: Pt reports that her R eye does not close as well and that she feels that  her L eye is compensating for her R eye. Baseline vision: Wears glasses all the time  VISION ASSESSMENT: To be further assessed in functional context  --------------------------------------------------------------------------------------------------------------------------------------------------------------------- (objective measures above completed at initial evaluation unless otherwise dated)  TODAY'S TREATMENT:  05/06/22 Box and Blocks; R: 20 blocks with no c/o pain in shoulder with movement Coordination: stacking 1" cubes with focus on precision pinch and wrist extension with stacking.  OT challenged pt to stabilize elbow on table top to facilitate increased wrist extension.   Cup stacking and flipping with focus on sustained grasp and motor control when picking up cups.  Pt demonstrating difficulty with rotation of cups requiring demonstration for increased positioning to facilitate internal rotation and pronation to change hand placement.   Resistance Clothespins 1-2# with RUE for mid functional reaching and sustained pinch. Pt req'd min cues and demonstration for improved hand positioning to facilitate increased pinch pattern and strengthening.    Attempted picking up coins and stones to challenge fine motor coordination with picking up items with some weight.  OT taping coins together to increase depth of item to allow for increased ease with picking up.  Pt still with difficulty, therefore provided recommendations for various household items to attempt picking up. Finger exercises: engaged in thumb opposition with cues to complete extension between each finger.  Pt able to touch to ring finger with increased effort but able to complete with repetition.  OT educated on thumb circles and extension AAROM and AROM to increase thumb ROM.    05/02/22 Coordination: picking up ping pong balls and placing 2 into hand.  Pt able to place 2 into palm with precision pinch and translation to palm, however with significant difficulty translating back to finger tips.  Pt able to place into target with one at a time, but not with translation.  OT increased challenge to picking up Chinese balls (with weight), pt demonstrating decreased motor control with increased weight, utilizing gross grasp vs precision grasp and dropping balls due to increased weight.   Theraputty: OT instructed pt in tip and 3 jaw chuck pinch as well as full grasp and rolling of putty with focus on ROM through hand and digits as well as isolated finger movements with pinches.  Pt demonstrating decreased strength and coordination, noting decreased opposition and difficulty keeping additional fingers removed from task.  Utilized light tan for resistance due to decreased pinch.  With repetition, pt demonstrating mild increase in opposition and pinch strength and control.   04/30/22 Pt reports having consult with Dr. Griffin Basil tomorrow about her shoulder.   Wrist flexion/extension: engaged in [facility] with hand placed on ball and rolling ball to facilitate wrist flexion/extension.  OT provided min cues and demonstration, encouraging pt to hold at end ranges 3-5 seconds for stretch.  Transitioned then  to ulnar/radial deviation while on ball, then incorporating wrist circumduction clockwise and counter clockwise.  OT providing demonstration and cues to maintain forearm positioning for improved wrist movement. Block stacking: engaged in stacking of 1x1x3 foam blocks with focus on wrist flexion/extension with stacking.  OT providing min cues for forearm placement to facilitate increased wrist mobility.  Pt demonstrating improved motor control and wrist extension. Peg board: OT challenged pt to engage in pinch and rotation of pegs to place in to peg board.  Pt able to pick up large grip pegs with precision pinch, however continues to demonstrate difficulties with rotation to flip peg 180* to then place in peg board.  Pt able to do  with significant effort and multiple shifts, demonstrating fatigue as task progressed.    PATIENT EDUCATION: Education details: ongoing education with focus on coordination and progressive ROM Person educated: Patient Education method: Explanation, Demonstration, and Verbal cues Education comprehension: verbalized understanding and needs further education   HOME EXERCISE PROGRAM: Fine motor coordination handout  Access Code: [email or web address] URL: [email or web address] Date: 05/02/2022 Prepared by: [facility]  Exercises - Putty Squeezes  - 1 x daily - 1 sets - 10 reps - Rolling Putty on Table  - 1 x daily - 1 sets - 10 reps - Tip Pinch with Putty  - 1 x daily - 1 sets - 10 reps - 3-Point Pinch with Putty  - 1 x daily - 1 sets - 10 reps - Finger Pinch and Pull with Putty  - 1 x daily - 1 sets - 10 reps - Seated Digit Tendon Gliding  - 1 x daily - 1 sets - 10 reps      GOALS: Goals reviewed with patient? Yes  SHORT TERM GOALS: Target date: 05/16/22   Pt will be independent with advanced HEP for fine and gross motor control.  Baseline:  Goal status: In progress  2.   Pt will demonstrate improved UE functional  use for ADLs as evidenced by increasing box/ blocks score by 4 blocks with RUE.  Baseline: R: 16 blocks and L: 53  Goal status: Met - R: 20 blocks on 05/06/22  3.   Pt will demonstrate and verbalize safe mobility/engagement of RUE s/p rotator cuff tear during ADLs/IADLs.  Baseline:  Goal status: In progress   LONG TERM GOALS: Target date: 06/06/22  Pt will demonstrate increased hand function and grasp to open containers (jars, medications) and verbalize understanding of AE PRN to increase ease, safety, and independence with opening containers. Baseline:  Goal status: In progress  2.  Pt will demonstrate use of BUE together to engage in functional IADL tasks (simple meal prep, folding laundry, etc). Baseline:  Goal status: In progress  3.  Pt will demonstrate increased functional reach and grasp to obtain or place light weight item from/on moderate height shelf to simulate IADLs. Baseline:  Goal status: In progress  4. Pt will increase RUE grip strength by 5# to increase success with holding and manipulating dishes and pots/pain for cooking.  Baseline:  Goal status: In progress  5.  Pt will be able to complete 9 hole peg test with RUE in 2 min time limit to demonstrate increased fine motor control as needed for ADLs/IADLs.  Baseline: able to place 2 pegs in 2 min time limit  Goal status: In progress   ASSESSMENT:  CLINICAL IMPRESSION: Treatment session with focus on movement through pain free range with primary focus on functional pinch and grasp.  Pt demonstrating improved coordination with picking up and stacking 1" cubes as well as pinch strength to manage 1 and 2# resistive clothespins.  OT understanding of modifications of hand placement to facilitate increased motor control.  PERFORMANCE DEFICITS in functional skills including ADLs, IADLs, coordination, dexterity, sensation, ROM, strength, FMC, GMC, balance, body mechanics, decreased knowledge of use of DME, and UE functional  use and psychosocial skills including environmental adaptation, habits, and routines and behaviors.   IMPAIRMENTS are limiting patient from ADLs, IADLs, and work.   COMORBIDITIES may have co-morbidities  that affects occupational performance. Patient will benefit from skilled OT to address above impairments and improve overall function.  MODIFICATION OR  ASSISTANCE TO COMPLETE EVALUATION: Min-Moderate modification of tasks or assist with assess necessary to complete an evaluation.  OT OCCUPATIONAL PROFILE AND HISTORY: Detailed assessment: Review of records and additional review of physical, cognitive, psychosocial history related to current functional performance.  CLINICAL DECISION MAKING: Moderate - several treatment options, min-mod task modification necessary  REHAB POTENTIAL: Good  EVALUATION COMPLEXITY: Moderate    PLAN: OT FREQUENCY: 2x/week  OT DURATION: 6 weeks  PLANNED INTERVENTIONS: self care/ADL training, therapeutic exercise, therapeutic activity, neuromuscular re-education, manual therapy, passive range of motion, balance training, functional mobility training, splinting, electrical stimulation, ultrasound, compression bandaging, moist heat, cryotherapy, patient/family education, psychosocial skills training, energy conservation, coping strategies training, and DME and/or AE instructions  RECOMMENDED OTHER SERVICES: N/A  CONSULTED AND AGREED WITH PLAN OF CARE: Patient and family member/caregiver  PLAN FOR NEXT SESSION: to see ortho PT for shoulder rehab - being followed by Dr. Griffin Basil  Engage in wrist ROM and hand opening/closing and attempt finger isolation exercises/activities. Gross grasp and reach activities.  Review theraputty exercises.  Massage (as appropriate) and tone management.   Simonne Come, OTR/L 05/06/2022, 12:02 PM

## 2022-05-06 NOTE — Patient Instructions (Signed)
Description   Continue taking warfarin 1 tablet daily. Continue green leafy veggie to weekly. Recheck INR in 3 weeks.  [facility] [phone]       

## 2022-05-07 ENCOUNTER — Ambulatory Visit: Payer: Federal, State, Local not specified - PPO

## 2022-05-07 DIAGNOSIS — R471 Dysarthria and anarthria: Secondary | ICD-10-CM

## 2022-05-07 DIAGNOSIS — M6281 Muscle weakness (generalized): Secondary | ICD-10-CM | POA: Diagnosis not present

## 2022-05-07 DIAGNOSIS — R4701 Aphasia: Secondary | ICD-10-CM

## 2022-05-07 DIAGNOSIS — R482 Apraxia: Secondary | ICD-10-CM

## 2022-05-07 DIAGNOSIS — R278 Other lack of coordination: Secondary | ICD-10-CM | POA: Diagnosis not present

## 2022-05-07 DIAGNOSIS — I69351 Hemiplegia and hemiparesis following cerebral infarction affecting right dominant side: Secondary | ICD-10-CM | POA: Diagnosis not present

## 2022-05-07 DIAGNOSIS — R208 Other disturbances of skin sensation: Secondary | ICD-10-CM | POA: Diagnosis not present
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
## 2022-05-08 ENCOUNTER — Telehealth: Payer: Self-pay

## 2022-05-08 ENCOUNTER — Encounter: Payer: Self-pay | Admitting: Neurology

## 2022-05-08 ENCOUNTER — Ambulatory Visit: Payer: Federal, State, Local not specified - PPO | Admitting: Physical Medicine and Rehabilitation

## 2022-05-08 ENCOUNTER — Ambulatory Visit (INDEPENDENT_AMBULATORY_CARE_PROVIDER_SITE_OTHER): Payer: Federal, State, Local not specified - PPO | Admitting: Neurology

## 2022-05-08 VITALS — BP 106/60 | HR 56 | Ht 60.0 in | Wt 145.0 lb

## 2022-05-08 DIAGNOSIS — E663 Overweight: Secondary | ICD-10-CM

## 2022-05-08 DIAGNOSIS — Z9189 Other specified personal risk factors, not elsewhere classified: Secondary | ICD-10-CM

## 2022-05-08 DIAGNOSIS — R0683 Snoring: Secondary | ICD-10-CM | POA: Diagnosis not present

## 2022-05-08 DIAGNOSIS — I63512 Cerebral infarction due to unspecified occlusion or stenosis of left middle cerebral artery: Secondary | ICD-10-CM | POA: Diagnosis not present

## 2022-05-08 DIAGNOSIS — R471 Dysarthria and anarthria: Secondary | ICD-10-CM | POA: Diagnosis not present

## 2022-05-08 DIAGNOSIS — I639 Cerebral infarction, unspecified: Secondary | ICD-10-CM

## 2022-05-08 NOTE — Progress Notes (Signed)
Subjective:    Patient ID: Chloe Johnson is a 66 y.o. female.  HPI    Huston Foley, MD, PhD [facility] [address] [address] [place], [place] [address]  Dear Chloe Johnson,  I saw your patient, Chloe Johnson, upon your kind request in my sleep clinic today for initial consultation of her sleep disorder, in particular, concern for underlying obstructive sleep apnea.  The patient is unaccompanied today.  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  Her Epworth sleepiness score is 0 out of 24.  I reviewed your office note from 03/31/2022. She reports that her difficulty sleeping improved after she started Lyrica, and Topamax, she is also currently taking naltrexone twice daily.  She is currently on Coumadin, previously prior to her last stroke she was on Eliquis.  She has residual right-sided weakness and dysarthria.  She mobilizes without assistance.  Bedtime is variable, currently between 10 PM and 2 AM and rise time between 7 AM and 10 AM.  She does not have night to night nocturia or recurrent morning headaches or nocturnal headaches.  She feels that she is sleeping fairly well but does have a history of snoring.  She lives with her husband.  She has a history of tonsillectomy at age 74.  She is a non-smoker and does not drink caffeine currently and drinks alcohol very occasionally, not daily.  Her Past Medical History Is Significant For: 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/16/2013   L3-4/notes 11/24/2013, arms, neck   Exertional asthma    GERD (gastroesophageal reflux disease)    Heart attack (HCC)  01/18/2022   Hypertension    Kidney stones    "have always passed them"   Neck pain    Osteomyelitis (HCC) 11/23/2013   osteomyelitis, discitis    Sleep concern    uses Prozac for sleep    Stroke (HCC) 01/21/2022   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; left with aphasia, dysarthria, and apraxia    Her Past Surgical History Is Significant For: Past Surgical History:  Procedure Laterality Date   BREAST CYST EXCISION Left 2010   "polypectomy"   IR CT HEAD LTD  01/21/2022   IR CT HEAD LTD  01/28/2022   IR PERCUTANEOUS ART THROMBECTOMY/INFUSION INTRACRANIAL INC DIAG ANGIO  01/21/2022   IR PERCUTANEOUS ART THROMBECTOMY/INFUSION INTRACRANIAL INC DIAG ANGIO  01/28/2022   IR US GUIDE VASC ACCESS RIGHT  01/22/2022   PICC LINE PLACE PERIPHERAL ([facility] HX) Right    for use of Levaquin & Vancomycin, of note: she reports that she had a "flulike feeling"    RADIOLOGY WITH ANESTHESIA N/A 01/21/2022   Procedure: IR WITH ANESTHESIA;  Surgeon: Julieanne Cotton, MD;  Location: [facility] OR;  Service: Radiology;  Laterality: N/A;   RADIOLOGY WITH ANESTHESIA N/A 01/28/2022   Procedure: IR WITH ANESTHESIA;  Surgeon: Radiologist, Medication, MD;  Location: [facility] OR;  Service: Radiology;  Laterality: N/A;   TONSILLECTOMY AND ADENOIDECTOMY  1975   TUBAL LIGATION  1999    Her Family History Is Significant For: Family History  Problem Relation Age of Onset   Other Mother    Other  Father    Hypertension Father    Cancer Sister    Sleep apnea Sister     Her Social History Is Significant For: Social History   Socioeconomic History   Marital status: Married    Spouse name: Leonette Most   Number of children: 3   Years of education: RN   Highest education level: Not on file  Occupational History    Employer: [employer]   Tobacco Use   Smoking status: Never   Smokeless tobacco: Never  Vaping Use   Vaping Use: Never used  Substance and Sexual Activity   Alcohol use: Yes    Alcohol/week: 2.0 standard drinks of  alcohol    Types: 2 Shots of liquor per week    Comment: "a little"; 1/2 a shot x 3 per week   Drug use: No    Comment: takes CBD gummies for sleep and anxiety   Sexual activity: Yes    Birth control/protection: Post-menopausal  Other Topics Concern   Not on file  Social History Narrative   Patient is married and lives at home with her husband Leonette Most).  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 Right handed.College education.         Social Determinants of Health   Financial Resource Strain: Not on file  Food Insecurity: Not on file  Transportation Needs: Not on file  Physical Activity: Not on file  Stress: Not on file  Social Connections: Not on file    Her Allergies Are:  Allergies  Allergen Reactions   Doxycycline Nausea Only    malaise    Erythromycin Nausea And Vomiting   Iodine     unknown   Vancomycin    Vicodin [Hydrocodone-Acetaminophen] Other (See Comments)    Restless, tolerate percocet   Dilaudid [Hydromorphone Hcl] Rash    *Pt states she can take if given benadryl prior*   Keflex [Cephalexin] Rash   Penicillins Rash    Hives   Sulfa Antibiotics Rash    Hives  :   Her Current Medications Are:  Outpatient Encounter Medications as of 05/08/2022  Medication Sig   acetaminophen (TYLENOL) 325 MG tablet Take 2 tablets (650 mg total) by mouth every 4 (four) hours as needed for mild pain (or temp > 37.5 C (99.5 F)).   cyclobenzaprine (FLEXERIL) 10 MG tablet Take 1 tablet (10 mg total) by mouth at bedtime.   famotidine (PEPCID) 20 MG tablet Take 1 tablet (20 mg total) by mouth 2 (two) times daily.   ferrous sulfate 325 (65 FE) MG tablet Take 1 tablet (325 mg total) by mouth daily with breakfast.   loratadine (CLARITIN) 10 MG tablet Take 1 tablet (10 mg total) by mouth daily.   metoprolol succinate (TOPROL-XL) 25 MG 24 hr tablet Take 0.5 tablets (12.5 mg total) by  mouth daily.   mirabegron ER (MYRBETRIQ) 50 MG TB24 tablet Take 25 mg by mouth daily.   naltrexone (DEPADE) 50 MG tablet Take 25 mg by mouth 2 (two) times daily. Take 1/2 tablet (25mg ) by mouth twice a day.   potassium chloride (KLOR-CON) 10 MEQ tablet Take 2 tablets by mouth twice daily   pregabalin (LYRICA) 200 MG capsule Take 1 capsule (200 mg total) by mouth 2 (two) times daily.   rosuvastatin (CRESTOR) 20 MG tablet Take 1 tablet (20 mg total) by mouth daily.   sacubitril-valsartan (ENTRESTO) 24-26 MG Take  1 tablet by mouth 2 (two) times daily.   topiramate (TOPAMAX) 25 MG tablet Take 1 tablet (25 mg total) by mouth daily as needed.   warfarin (COUMADIN) 4 MG tablet Take 1 tablet (4 mg total) by mouth daily.   No facility-administered encounter medications on file as of 05/08/2022.  :  Review of Systems:  Out of a complete 14 point review of systems, all are reviewed and negative with the exception of these symptoms as listed below:  Review of Systems  Neurological:        Patient is here alone for sleep consult. She endorses EDS, fatigue, small oropharyngeal space. Her husband states she snores. She states she had a home sleep test about 7 years ago and it was negative (with Dr Valentina Lucks at [facility]). ESS 0, couldn't complete FSS d/t to aphasia.    Objective:  Neurological Exam  Physical Exam Physical Examination:   Vitals:   05/08/22 1358  BP: 106/60  Pulse: (!) 56    General Examination: The patient is a very pleasant 66 y.o. female in no acute distress. She appears well-developed and well-nourished and well groomed.   HEENT: Normocephalic, atraumatic, pupils are equal, round and reactive to light, extraocular tracking is well-preserved, face is slightly asymmetric with right lower facial weakness noted.  Speech with dysarthria, good comprehension.  No carotid bruits.  Neck circumference 13-1/2 inches.  Minimal overbite noted.  Airway examination reveals small airway  entry, slight deviation of the tongue to the right, palate elevates slightly better on the left and higher compared to right.  Mallampati class I.    Chest: Clear to auscultation without wheezing, rhonchi or crackles noted.  Heart: S1+S2+0, regular and normal without murmurs, rubs or gallops noted.   Abdomen: Soft, non-tender and non-distended.  Extremities: There is no pitting edema in the distal lower extremities bilaterally.   Skin: Warm and dry without trophic changes noted.   Musculoskeletal: exam reveals no obvious joint deformities.   Neurologically:  Mental status: The patient is awake, alert and oriented in all 4 spheres. Her immediate and remote memory, attention, language skills and fund of knowledge are appropriate. There is no evidence of aphasia, agnosia, apraxia or anomia. Speech is clear with normal prosody and enunciation. Thought process is linear. Mood is normal and affect is normal.  Cranial nerves II - XII are as described above under HEENT exam.  Motor exam: Normal bulk, mild weakness in the right grip and increase in tone in right upper extremity, very slight weakness in the right lower extremity.  Fine motor skills impaired on the right upper and lower extremities.   Cerebellar testing: No dysmetria or intention tremor. There is no truncal or gait ataxia.  Sensory exam: intact to light touch in the upper and lower extremities.  Gait, station and balance: she stands with mild difficulty, does not require assistance, walks with a slight limp and mild slowness, no walking aid.    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   A laboratory attended sleep study is typically considered "gold standard" for evaluation of sleep disordered breathing.   I had  a long chat with the patient about my findings and the diagnosis of sleep apnea, particularly OSA, its prognosis and treatment options. We talked about medical/conservative treatments, surgical interventions and non-pharmacological approaches for symptom control. I explained, in particular, the risks and ramifications of untreated moderate to severe OSA, especially with respect to developing cardiovascular disease down the road, including congestive heart failure (CHF), difficult to treat hypertension, cardiac arrhythmias (particularly A-fib), neurovascular complications including TIA, stroke and dementia. Even type 2 diabetes has, in part, been linked to untreated OSA. Symptoms of untreated OSA may include (but may not be limited to) daytime sleepiness, nocturia (i.e. frequent nighttime urination), memory problems, mood irritability and suboptimally controlled or worsening mood disorder such as depression and/or anxiety, lack of energy, lack of motivation, physical discomfort, as well as recurrent headaches, especially morning or nocturnal headaches. We talked about the importance of maintaining a healthy lifestyle and striving for healthy weight.  I recommended a sleep study at this time. I outlined the differences between a laboratory attended sleep study which is considered more comprehensive and accurate over the option of a home sleep test (HST); the latter may lead to underestimation of sleep disordered breathing in some instances and does not help with diagnosing upper airway resistance syndrome and is not accurate enough to diagnose primary central sleep apnea typically. I outlined possible surgical and non-surgical treatment options of OSA, including the use of a positive airway pressure (PAP) device (i.e.  CPAP, AutoPAP/APAP or BiPAP in certain circumstances), a custom-made dental device (aka oral appliance, which would require a referral to a specialist dentist or orthodontist typically, and is generally speaking not considered for patients with full dentures or edentulous state), upper airway surgical options, such as traditional UPPP (which is not considered a first-line treatment) or the Inspire device (hypoglossal nerve stimulator, which would involve a referral for consultation with an ENT surgeon, after careful selection, following inclusion criteria - also not first-line treatment). I explained the PAP treatment option to the patient in detail, as this is generally considered first-line treatment.  The patient indicated that she would be willing to try PAP therapy, if the need arises. I explained the importance of being compliant with PAP treatment, not only for insurance purposes but primarily to improve patient's symptoms symptoms, and for the patient's long term health benefit, including to reduce Her cardiovascular risks longer-term.    We will pick up our discussion about the next steps and treatment options after testing.  We will keep her posted as to the test results by phone call and/or MyChart messaging where possible.  We will plan to follow-up in sleep clinic accordingly as well.  I answered all her questions today and the patient was in agreement.   I encouraged her to call with any interim questions, concerns, problems or updates or email [place] through MyChart.  Generally speaking, sleep test authorizations may take up to 2 weeks, sometimes less, sometimes longer, the patient is encouraged to get in touch with [place] if they do not hear back from the sleep lab staff directly within the next 2 weeks.  Thank you very much for allowing me to participate in the care of this nice patient. If I can be of any further assistance to you please do not hesitate to talk to me.  Sincerely,   Huston Foley,  MD, PhD

## 2022-05-08 NOTE — Patient Instructions (Signed)

## 2022-05-08 NOTE — Telephone Encounter (Signed)
Patient husband called in asking about disability paper work stating that it was faxed over again because there was some blank pages and it needs to be filled out and faxed back in and if he can get a call back at [phone]

## 2022-05-09 ENCOUNTER — Ambulatory Visit: Payer: Federal, State, Local not specified - PPO | Admitting: Occupational Therapy

## 2022-05-09 DIAGNOSIS — R482 Apraxia: Secondary | ICD-10-CM | POA: Diagnosis not present

## 2022-05-09 DIAGNOSIS — R278 Other lack of coordination: Secondary | ICD-10-CM

## 2022-05-09 DIAGNOSIS — R208 Other disturbances of skin sensation: Secondary | ICD-10-CM | POA: Diagnosis not present

## 2022-05-09 DIAGNOSIS — M6281 Muscle weakness (generalized): Secondary | ICD-10-CM | POA: Diagnosis not present

## 2022-05-09 DIAGNOSIS — R471 Dysarthria and anarthria: Secondary | ICD-10-CM | POA: Diagnosis not present

## 2022-05-09 DIAGNOSIS — I69351 Hemiplegia and hemiparesis following cerebral infarction affecting right dominant side: Secondary | ICD-10-CM

## 2022-05-09 DIAGNOSIS — R4701 Aphasia: Secondary | ICD-10-CM | POA: Diagnosis not present

## 2022-05-09 LAB — FACTOR 5 LEIDEN

## 2022-05-09 NOTE — Therapy (Signed)
OUTPATIENT OCCUPATIONAL THERAPY  Treatment Note  Patient Name: Chloe Johnson MRN: [id number] DOB:09-May-1956, 66 y.o., female Today's Date: 05/09/2022  PCP: Charlane Ferretti, MD REFERRING PROVIDER: Izora Ribas, MD    OT End of Session - 05/09/22 1149     Visit Number 17    Number of Visits 25    Date for OT Re-Evaluation 06/06/22    Authorization Type [employer]    OT Start Time 0932    OT Stop Time 1016    OT Time Calculation (min) 44 min                         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/16/2013   L3-4/notes 11/24/2013, arms, neck   Exertional asthma    GERD (gastroesophageal reflux disease)    Heart attack ([place]) 01/18/2022   Hypertension    Kidney stones    "have always passed them"   Neck pain    Osteomyelitis ([place]) 11/23/2013   osteomyelitis, discitis    Sleep concern    uses Prozac for sleep    Stroke ([place]) 01/21/2022   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; left with aphasia, dysarthria, and apraxia   Past Surgical History:  Procedure Laterality Date   BREAST CYST EXCISION Left 2010   "polypectomy"   IR CT HEAD LTD  01/21/2022   IR CT HEAD LTD  01/28/2022   IR PERCUTANEOUS ART THROMBECTOMY/INFUSION INTRACRANIAL INC DIAG ANGIO  01/21/2022   IR PERCUTANEOUS ART THROMBECTOMY/INFUSION INTRACRANIAL INC DIAG ANGIO  01/28/2022   IR US GUIDE VASC ACCESS RIGHT  01/22/2022   PICC LINE PLACE PERIPHERAL ([place] [facility]) Right    for use of Levaquin & Vancomycin, of note: she reports that she had a "flulike feeling"    RADIOLOGY WITH ANESTHESIA N/A 01/21/2022   Procedure: IR WITH ANESTHESIA;  Surgeon: Luanne Bras, MD;  Location: [facility];  Service: Radiology;  Laterality: N/A;   RADIOLOGY WITH ANESTHESIA N/A 01/28/2022   Procedure: IR WITH ANESTHESIA;  Surgeon: Radiologist, Medication, MD;  Location: [facility];  Service: Radiology;  Laterality: N/A;   [place]    Patient Active Problem List   Diagnosis Date Noted   Aphasia 03/31/2022   Snores 03/31/2022   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   UTI (urinary tract infection) 02/04/2022   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) 01/21/2022   S/P lumbar spinal fusion 11/15/2014   Lumbar discitis 11/29/2013   Discitis 11/24/2013   Diarrhea 11/17/2013   Osteomyelitis of lumbar spine ([place]) 11/16/2013   Hypertension    Asthma    Anxiety     ONSET DATE: 01/28/22  REFERRING DIAG: I63.9 (ICD-10-CM) - Cerebral infarction, unspecified   THERAPY DIAG:  Hemiplegia and hemiparesis following cerebral infarction affecting right dominant side (HCC)  Muscle weakness (generalized)  Other lack of coordination  Other disturbances of skin  sensation  Rationale for Evaluation and Treatment Rehabilitation  SUBJECTIVE:   SUBJECTIVE STATEMENT: Pt reports stiffness in her index and long finger on R hand. Pt accompanied by: self  PERTINENT HISTORY: PMH: HTN, cervical radiculopathy, hepatitis, osteomyelitis/discitis, lumbar stenosis with foot drop anxiety, GERD, insomnia  PRECAUTIONS: Fall  WEIGHT BEARING RESTRICTIONS  was awaiting spinal fusion surgery, limit bending, twisting, lifting >10#  PAIN:  Are you having pain? Yes: NPRS scale: 2/10 Pain location: R hand, particularly index and long finger Pain description: tight Aggravating factors: unknown Relieving factors: massage  FALLS: Has  patient fallen in last 6 months? Yes. Number of falls 2  LIVING ENVIRONMENT: Lives with: lives with their spouse Lives in: House/apartment Stairs: Yes: Internal: full flights of steps; on right going up and External: 7 steps; on left going up Has following equipment at home: Single point cane, Walker - 2 wheeled, Walker - 4 wheeled, shower chair, and bed side commode  PLOF: Independent  PATIENT GOALS I want my arm back.    OBJECTIVE:   HAND DOMINANCE: Right  ADLs: Transfers/ambulation related to ADLs: Mod I with no use of  Eating: Husband assists with cutting, feeding self with L hand Grooming: utilizing L hand with opening and brushing teeth UB Dressing: unable to manage buttons, otherwise Mod I LB Dressing: unable to tie shoes, just slipping on toes Toileting: Mod I with use of L hand Bathing: Mod I, utilizing L hand to shave Tub Shower transfers: Mod I Equipment: Shower seat with back, Walk in shower, and bed side commode   IADLs: Light housekeeping: laundry and hanging up clothes, wipes counters/sink, needs assist to fold clothing Meal Prep: no, did not cook pre-stroke Medication management: husband opens pill bottles Handwriting: unable  MOBILITY STATUS: Needs Assist: Supervision - Mod I without use of AD  POSTURE COMMENTS:  No Significant postural limitations  ACTIVITY TOLERANCE: Activity tolerance: WNL for tasks assessed on eval  FUNCTIONAL OUTCOME MEASURES: FOTO: 49  UPPER EXTREMITY ROM     Active ROM Right eval Left eval  Shoulder flexion WNL WNL  Shoulder abduction    Shoulder adduction    Shoulder extension    Shoulder internal rotation    Shoulder external rotation    Elbow flexion    Elbow extension -5 WNL  Wrist flexion 38 WNL  Wrist extension 50 WNL  Wrist ulnar deviation    Wrist radial deviation    Wrist pronation    Wrist supination    (Blank rows = not tested)   UPPER EXTREMITY MMT:     MMT Right eval Left eval  Shoulder  flexion 4+/5   Shoulder abduction    Shoulder adduction    Shoulder extension    Shoulder internal rotation    Shoulder external rotation    Middle trapezius    Lower trapezius    Elbow flexion 4+/5   Elbow extension 4+/5   Wrist flexion    Wrist extension    Wrist ulnar deviation    Wrist radial deviation    Wrist pronation    Wrist supination    (Blank rows = not tested)  HAND FUNCTION: Loose gross grasp, unable to squeeze dynamometer  COORDINATION: Box and Blocks:  Right 10 blocks, Left 53 blocks Modified performance with RUE with therapist holding blocks in palm and pt able to complete gross grasp to pick up one at a time from OT palm of hand, minimal hand opening/closing 04/02/22: Right: 16 blocks  SENSATION: Light touch: WFL Stereognosis: Impaired  Hot/Cold: Impaired  Proprioception: Impaired   COGNITION: Overall cognitive status: Within functional limits for tasks assessed  VISION: Subjective report: Pt reports that her R eye does not close as well and that she feels that her L eye is compensating for her R eye. Baseline vision: Wears glasses all the time  VISION ASSESSMENT: To be further assessed in functional context  --------------------------------------------------------------------------------------------------------------------------------------------------------------------- (objective measures above completed at initial evaluation unless otherwise dated)  TODAY'S TREATMENT:  05/09/22 Coordination: placing large pegs into resistive foam board. Pt demonstrating increased rotation and motor control when rotating peg from tip pinch to tripod pinch to push into peg board.  OT increased challenge to incorporating rotation when removing pegs. Pt able to rotate 50%, demonstrating improved intrinsic control and coordination, however unable to fully rotate.  Pt dropping 60% of pegs with initial removal and attempt at rotation. [place]: attempting to rotate small dice  in fingers, however with decreased ability to coordinate movements.  Modified task to stacking small dice with improved control.  OT challenged pt in lacing activity.  Pt demonstrating improved precision pinch with lacing and ability to coordinate wrist and finger movements with lacing.      05/06/22 Box and Blocks; R: 20 blocks with no c/o pain in shoulder with movement Coordination: stacking 1" cubes with focus on precision pinch and wrist extension with stacking.  OT challenged pt to stabilize elbow on table top to facilitate increased wrist extension.   Cup stacking and flipping with focus on sustained grasp and motor control when picking up cups.  Pt demonstrating difficulty with rotation of cups requiring demonstration for increased positioning to facilitate internal rotation and pronation to change hand placement.   Resistance Clothespins 1-2# with RUE for mid functional reaching and sustained pinch. Pt req'd min cues and demonstration for improved hand positioning to facilitate increased pinch pattern and strengthening.   Attempted picking up coins and stones to challenge fine motor coordination with picking up items with some weight.  OT taping coins together to increase depth of item to allow for increased ease with picking up.  Pt still with difficulty, therefore provided recommendations for various household items to attempt picking up. Finger exercises: engaged in thumb opposition with cues to complete extension between each finger.  Pt able to touch to ring finger with increased effort but able to complete with repetition.  OT educated on thumb circles and extension AAROM and AROM to increase thumb ROM.    05/02/22 Coordination: picking up ping pong balls and placing 2 into hand.  Pt able to place 2 into palm with precision pinch and translation to palm, however with significant difficulty translating back to finger tips.  Pt able to place into target with one at a time, but not with  translation.  OT increased challenge to picking up Chinese balls (with weight), pt demonstrating decreased motor control with increased weight, utilizing gross grasp vs precision grasp and dropping balls due to increased weight.   Theraputty: OT instructed pt in tip and 3 jaw chuck pinch as well as full grasp and rolling of putty with focus on ROM through hand and digits as well as isolated finger movements with pinches.  Pt demonstrating decreased strength and coordination, noting decreased opposition and difficulty keeping additional fingers removed from task.  Utilized light tan for resistance due to decreased pinch.  With repetition, pt demonstrating mild increase in opposition and pinch strength and control.   PATIENT EDUCATION: Education details: ongoing education with focus on coordination  and progressive ROM Person educated: Patient Education method: Explanation, Demonstration, and Verbal cues Education comprehension: verbalized understanding and needs further education   HOME EXERCISE PROGRAM: Fine motor coordination handout  Access Code: [email or web address] URL: [email or web address] Date: 05/02/2022 Prepared by: [facility]  Exercises - Putty Squeezes  - 1 x daily - 1 sets - 10 reps - Rolling Putty on Table  - 1 x daily - 1 sets - 10 reps - Tip Pinch with Putty  - 1 x daily - 1 sets - 10 reps - 3-Point Pinch with Putty  - 1 x daily - 1 sets - 10 reps - Finger Pinch and Pull with Putty  - 1 x daily - 1 sets - 10 reps - Seated Digit Tendon Gliding  - 1 x daily - 1 sets - 10 reps      GOALS: Goals reviewed with patient? Yes  SHORT TERM GOALS: Target date: 05/16/22   Pt will be independent with advanced HEP for fine and gross motor control.  Baseline:  Goal status: In progress  2.   Pt will demonstrate improved UE functional use for ADLs as evidenced by increasing box/ blocks score by 4 blocks with RUE.  Baseline: R: 16 blocks  and L: 53  Goal status: Met - R: 20 blocks on 05/06/22  3.   Pt will demonstrate and verbalize safe mobility/engagement of RUE s/p rotator cuff tear during ADLs/IADLs.  Baseline:  Goal status: In progress   LONG TERM GOALS: Target date: 06/06/22  Pt will demonstrate increased hand function and grasp to open containers (jars, medications) and verbalize understanding of AE PRN to increase ease, safety, and independence with opening containers. Baseline:  Goal status: In progress  2.  Pt will demonstrate use of BUE together to engage in functional IADL tasks (simple meal prep, folding laundry, etc). Baseline:  Goal status: In progress  3.  Pt will demonstrate increased functional reach and grasp to obtain or place light weight item from/on moderate height shelf to simulate IADLs. Baseline:  Goal status: In progress  4. Pt will increase RUE grip strength by 5# to increase success with holding and manipulating dishes and pots/pain for cooking.  Baseline:  Goal status: In progress  5.  Pt will be able to complete 9 hole peg test with RUE in 2 min time limit to demonstrate increased fine motor control as needed for ADLs/IADLs.  Baseline: able to place 2 pegs in 2 min time limit  Goal status: In progress   ASSESSMENT:  CLINICAL IMPRESSION: Pt demonstrating improved coordination with precision pinch, rotation, and tripod pinch against resistance of resistive peg board.  Pt demonstrating improved finger coordination and isolated movements of index, long finger, and thumb.  Pt pleased with ability to complete lacing and rotation this session.  PERFORMANCE DEFICITS in functional skills including ADLs, IADLs, coordination, dexterity, sensation, ROM, strength, FMC, GMC, balance, body mechanics, decreased knowledge of use of DME, and UE functional use and psychosocial skills including environmental adaptation, habits, and routines and behaviors.   IMPAIRMENTS are limiting patient from ADLs,  IADLs, and work.   COMORBIDITIES may have co-morbidities  that affects occupational performance. Patient will benefit from skilled OT to address above impairments and improve overall function.  MODIFICATION OR ASSISTANCE TO COMPLETE EVALUATION: Min-Moderate modification of tasks or assist with assess necessary to complete an evaluation.  OT OCCUPATIONAL PROFILE AND HISTORY: Detailed assessment: Review of records and additional review of physical, cognitive, psychosocial  history related to current functional performance.  CLINICAL DECISION MAKING: Moderate - several treatment options, min-mod task modification necessary  REHAB POTENTIAL: Good  EVALUATION COMPLEXITY: Moderate    PLAN: OT FREQUENCY: 2x/week  OT DURATION: 6 weeks  PLANNED INTERVENTIONS: self care/ADL training, therapeutic exercise, therapeutic activity, neuromuscular re-education, manual therapy, passive range of motion, balance training, functional mobility training, splinting, electrical stimulation, ultrasound, compression bandaging, moist heat, cryotherapy, patient/family education, psychosocial skills training, energy conservation, coping strategies training, and DME and/or AE instructions  RECOMMENDED OTHER SERVICES: N/A  CONSULTED AND AGREED WITH PLAN OF CARE: Patient and family member/caregiver  PLAN FOR NEXT SESSION: to see ortho PT for shoulder rehab - being followed by Dr. Griffin Basil  Engage in wrist ROM and hand opening/closing and attempt finger isolation exercises/activities. Gross grasp and reach activities.  Review theraputty exercises.  Massage (as appropriate) and tone management.   Simonne Come, OTR/L 05/09/2022, 11:49 AM

## 2022-05-12 DIAGNOSIS — M6281 Muscle weakness (generalized): Secondary | ICD-10-CM | POA: Diagnosis not present

## 2022-05-12 DIAGNOSIS — M75111 Incomplete rotator cuff tear or rupture of right shoulder, not specified as traumatic: Secondary | ICD-10-CM | POA: Diagnosis not present

## 2022-05-12 LAB — PROTHROMBIN GENE MUTATION

## 2022-05-13 ENCOUNTER — Telehealth: Payer: Self-pay

## 2022-05-13 ENCOUNTER — Ambulatory Visit: Payer: Federal, State, Local not specified - PPO | Admitting: Occupational Therapy

## 2022-05-13 ENCOUNTER — Ambulatory Visit: Payer: Federal, State, Local not specified - PPO | Admitting: Genetic Counselor

## 2022-05-13 DIAGNOSIS — I69351 Hemiplegia and hemiparesis following cerebral infarction affecting right dominant side: Secondary | ICD-10-CM | POA: Diagnosis not present

## 2022-05-13 DIAGNOSIS — R278 Other lack of coordination: Secondary | ICD-10-CM

## 2022-05-13 DIAGNOSIS — R208 Other disturbances of skin sensation: Secondary | ICD-10-CM | POA: Diagnosis not present

## 2022-05-13 DIAGNOSIS — R471 Dysarthria and anarthria: Secondary | ICD-10-CM | POA: Diagnosis not present

## 2022-05-13 DIAGNOSIS — M6281 Muscle weakness (generalized): Secondary | ICD-10-CM | POA: Diagnosis not present

## 2022-05-13 DIAGNOSIS — R482 Apraxia: Secondary | ICD-10-CM | POA: Diagnosis not present

## 2022-05-13 DIAGNOSIS — R4701 Aphasia: Secondary | ICD-10-CM | POA: Diagnosis not present

## 2022-05-13 NOTE — Therapy (Signed)
OUTPATIENT OCCUPATIONAL THERAPY  Treatment Note  Patient Name: Chloe Johnson MRN: [id number] DOB:24-Sep-1955, 66 y.o., female Today's Date: 05/13/2022  PCP: Charlane Ferretti, MD REFERRING PROVIDER: Izora Ribas, MD    OT End of Session - 05/13/22 1019     Visit Number 18    Number of Visits 25    Date for OT Re-Evaluation 06/06/22    Authorization Type [employer]    OT Start Time 1017    OT Stop Time 1100    OT Time Calculation (min) 43 min                          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/16/2013   L3-4/notes 11/24/2013, arms, neck   Exertional asthma    GERD (gastroesophageal reflux disease)    Heart attack ([place]) 01/18/2022   Hypertension    Kidney stones    "have always passed them"   Neck pain    Osteomyelitis ([place]) 11/23/2013   osteomyelitis, discitis    Sleep concern    uses Prozac for sleep    Stroke ([place]) 01/21/2022   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; left with aphasia, dysarthria, and apraxia   Past Surgical History:  Procedure Laterality Date   BREAST CYST EXCISION Left 2010   "polypectomy"   IR CT HEAD LTD  01/21/2022   IR CT HEAD LTD  01/28/2022   IR PERCUTANEOUS ART THROMBECTOMY/INFUSION INTRACRANIAL INC DIAG ANGIO  01/21/2022   IR PERCUTANEOUS ART THROMBECTOMY/INFUSION INTRACRANIAL INC DIAG ANGIO  01/28/2022   IR US GUIDE VASC ACCESS RIGHT  01/22/2022   PICC LINE PLACE PERIPHERAL ([place] [facility]) Right    for use of Levaquin & Vancomycin, of note: she reports that she had a "flulike feeling"    RADIOLOGY WITH ANESTHESIA N/A 01/21/2022   Procedure: IR WITH ANESTHESIA;  Surgeon: Luanne Bras, MD;  Location: [facility];  Service: Radiology;  Laterality: N/A;   RADIOLOGY WITH ANESTHESIA N/A 01/28/2022   Procedure: IR WITH ANESTHESIA;  Surgeon: Radiologist, Medication, MD;  Location: [facility];  Service: Radiology;  Laterality: N/A;   [place]    Patient Active Problem List   Diagnosis Date Noted   Aphasia 03/31/2022   Snores 03/31/2022   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   UTI (urinary tract infection) 02/04/2022   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   Stroke determined by clinical assessment ([place]) 01/28/2022   Middle cerebral artery embolism, left 01/28/2022   H/O ischemic left MCA stroke    Acute respiratory failure (HCC)    CVA (cerebral vascular accident) ([place]) 01/21/2022   NSTEMI (non-ST elevated myocardial infarction) ([place]) 01/21/2022   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   HFrEF (heart failure with reduced ejection fraction) ([place]) 01/21/2022   Hyperlipidemia 01/21/2022   Lumbar stenosis 01/21/2022   Stroke (cerebrum) ([place]) 01/21/2022   S/P lumbar spinal fusion 11/15/2014   Lumbar discitis 11/29/2013   Discitis 11/24/2013   Diarrhea 11/17/2013   Osteomyelitis of lumbar spine ([place]) 11/16/2013   Hypertension    Asthma    Anxiety     ONSET DATE: 01/28/22  REFERRING DIAG: I63.9 (ICD-10-CM) - Cerebral infarction, unspecified   THERAPY DIAG:  Hemiplegia and hemiparesis following cerebral infarction affecting right dominant side (HCC)  Muscle weakness (generalized)  Other lack of coordination  Other disturbances of  skin sensation  Rationale for Evaluation and Treatment Rehabilitation  SUBJECTIVE:   SUBJECTIVE STATEMENT: Pt reports she has been up since 6 getting the house ready for the carpet cleaners to come in preparation for the holidays.  Pt accompanied by: self  PERTINENT HISTORY: PMH: HTN, cervical radiculopathy, hepatitis, osteomyelitis/discitis, lumbar stenosis with foot drop anxiety, GERD, insomnia  PRECAUTIONS: Fall  WEIGHT BEARING RESTRICTIONS  was awaiting spinal fusion surgery, limit bending, twisting, lifting >10#  PAIN:  Are you having pain? Yes: NPRS scale: 2/10 Pain location: R hand, particularly index and long finger Pain description:  tight Aggravating factors: unknown Relieving factors: massage  FALLS: Has patient fallen in last 6 months? Yes. Number of falls 2  LIVING ENVIRONMENT: Lives with: lives with their spouse Lives in: House/apartment Stairs: Yes: Internal: full flights of steps; on right going up and External: 7 steps; on left going up Has following equipment at home: Single point cane, Walker - 2 wheeled, Walker - 4 wheeled, shower chair, and bed side commode  PLOF: Independent  PATIENT GOALS I want my arm back.    OBJECTIVE:   HAND DOMINANCE: Right  ADLs: Transfers/ambulation related to ADLs: Mod I with no use of  Eating: Husband assists with cutting, feeding self with L hand Grooming: utilizing L hand with opening and brushing teeth UB Dressing: unable to manage buttons, otherwise Mod I LB Dressing: unable to tie shoes, just slipping on toes Toileting: Mod I with use of L hand Bathing: Mod I, utilizing L hand to shave Tub Shower transfers: Mod I Equipment: Shower seat with back, Walk in shower, and bed side commode   IADLs: Light housekeeping: laundry and hanging up clothes, wipes counters/sink, needs assist to fold clothing Meal Prep: no, did not cook pre-stroke Medication management: husband opens pill bottles Handwriting: unable  MOBILITY STATUS: Needs Assist: Supervision - Mod I without use of AD  POSTURE COMMENTS:  No Significant postural limitations  ACTIVITY TOLERANCE: Activity tolerance: WNL for tasks assessed on eval  FUNCTIONAL OUTCOME MEASURES: FOTO: 49  UPPER EXTREMITY ROM     Active ROM Right eval Left eval  Shoulder flexion WNL WNL  Shoulder abduction    Shoulder adduction    Shoulder extension    Shoulder internal rotation    Shoulder external rotation    Elbow flexion    Elbow extension -5 WNL  Wrist flexion 38 WNL  Wrist extension 50 WNL  Wrist ulnar deviation    Wrist radial deviation    Wrist pronation    Wrist supination    (Blank rows = not  tested)   UPPER EXTREMITY MMT:     MMT Right eval Left eval  Shoulder flexion 4+/5   Shoulder abduction    Shoulder adduction    Shoulder extension    Shoulder internal rotation    Shoulder external rotation    Middle trapezius    Lower trapezius    Elbow flexion 4+/5   Elbow extension 4+/5   Wrist flexion    Wrist extension    Wrist ulnar deviation    Wrist radial deviation    Wrist pronation    Wrist supination    (Blank rows = not tested)  HAND FUNCTION: Loose gross grasp, unable to squeeze dynamometer  COORDINATION: Box and Blocks:  Right 10 blocks, Left 53 blocks Modified performance with RUE with therapist holding blocks in palm and pt able to complete gross grasp to pick up one at a time from OT palm of hand, minimal  hand opening/closing 04/02/22: Right: 16 blocks  SENSATION: Light touch: WFL Stereognosis: Impaired  Hot/Cold: Impaired  Proprioception: Impaired   COGNITION: Overall cognitive status: Within functional limits for tasks assessed  VISION: Subjective report: Pt reports that her R eye does not close as well and that she feels that her L eye is compensating for her R eye. Baseline vision: Wears glasses all the time  VISION ASSESSMENT: To be further assessed in functional context  --------------------------------------------------------------------------------------------------------------------------------------------------------------------- (objective measures above completed at initial evaluation unless otherwise dated)  TODAY'S TREATMENT:  05/13/22 Lacing: lacing beads with picking up string and threading with RUE while holding large bead in L hand.  Pt requiring increased time and dragging of string to edge of table to pick it up in pincer grasp.  Pt alternating between pincer and gross grasp with string.  OT modified to use of pipe cleaner (instead of string) to challenge pincer grasp with picking up from table top.  Pt continues with  difficulty, but improvements with picking up from table top. Resistance Clothespins 1,2 4# with RUE for mid functional reaching and sustained pinch. Pt req'd min cues for movement pattern and focus on pinch vs gross grasp. Coordination: picking up, turning over, and placing large jigsaw puzzle pieces with RUE.  Pt demonstrating mod difficulty with picking up and turning over jigsaw pieces, however much improved than coins due to thickness of pieces.  Pt able to rotate them in hand with intermittent use of table top to place in correct spaces.  OT challenging pt to push pieces into place with index finger to challenge isolated finger extension.    05/09/22 Coordination: placing large pegs into resistive foam board. Pt demonstrating increased rotation and motor control when rotating peg from tip pinch to tripod pinch to push into peg board.  OT increased challenge to incorporating rotation when removing pegs. Pt able to rotate 50%, demonstrating improved intrinsic control and coordination, however unable to fully rotate.  Pt dropping 60% of pegs with initial removal and attempt at rotation. [place]: attempting to rotate small dice in fingers, however with decreased ability to coordinate movements.  Modified task to stacking small dice with improved control.  OT challenged pt in lacing activity.  Pt demonstrating improved precision pinch with lacing and ability to coordinate wrist and finger movements with lacing.      05/06/22 Box and Blocks; R: 20 blocks with no c/o pain in shoulder with movement Coordination: stacking 1" cubes with focus on precision pinch and wrist extension with stacking.  OT challenged pt to stabilize elbow on table top to facilitate increased wrist extension.   Cup stacking and flipping with focus on sustained grasp and motor control when picking up cups.  Pt demonstrating difficulty with rotation of cups requiring demonstration for increased positioning to facilitate internal  rotation and pronation to change hand placement.   Resistance Clothespins 1-2# with RUE for mid functional reaching and sustained pinch. Pt req'd min cues and demonstration for improved hand positioning to facilitate increased pinch pattern and strengthening.   Attempted picking up coins and stones to challenge fine motor coordination with picking up items with some weight.  OT taping coins together to increase depth of item to allow for increased ease with picking up.  Pt still with difficulty, therefore provided recommendations for various household items to attempt picking up. Finger exercises: engaged in thumb opposition with cues to complete extension between each finger.  Pt able to touch to ring finger with increased effort but  able to complete with repetition.  OT educated on thumb circles and extension AAROM and AROM to increase thumb ROM.   PATIENT EDUCATION: Education details: ongoing education with focus on coordination and progressive ROM Person educated: Patient Education method: Explanation, Demonstration, and Verbal cues Education comprehension: verbalized understanding and needs further education   HOME EXERCISE PROGRAM: Fine motor coordination handout  Access Code: [email or web address] URL: [email or web address] Date: 05/02/2022 Prepared by: [facility]  Exercises - Putty Squeezes  - 1 x daily - 1 sets - 10 reps - Rolling Putty on Table  - 1 x daily - 1 sets - 10 reps - Tip Pinch with Putty  - 1 x daily - 1 sets - 10 reps - 3-Point Pinch with Putty  - 1 x daily - 1 sets - 10 reps - Finger Pinch and Pull with Putty  - 1 x daily - 1 sets - 10 reps - Seated Digit Tendon Gliding  - 1 x daily - 1 sets - 10 reps      GOALS: Goals reviewed with patient? Yes  SHORT TERM GOALS: Target date: 05/16/22   Pt will be independent with advanced HEP for fine and gross motor control.  Baseline:  Goal status: In progress  2.   Pt will  demonstrate improved UE functional use for ADLs as evidenced by increasing box/ blocks score by 4 blocks with RUE.  Baseline: R: 16 blocks and L: 53  Goal status: Met - R: 20 blocks on 05/06/22  3.   Pt will demonstrate and verbalize safe mobility/engagement of RUE s/p rotator cuff tear during ADLs/IADLs.  Baseline:  Goal status: In progress   LONG TERM GOALS: Target date: 06/06/22  Pt will demonstrate increased hand function and grasp to open containers (jars, medications) and verbalize understanding of AE PRN to increase ease, safety, and independence with opening containers. Baseline:  Goal status: In progress  2.  Pt will demonstrate use of BUE together to engage in functional IADL tasks (simple meal prep, folding laundry, etc). Baseline:  Goal status: In progress  3.  Pt will demonstrate increased functional reach and grasp to obtain or place light weight item from/on moderate height shelf to simulate IADLs. Baseline:  Goal status: In progress  4. Pt will increase RUE grip strength by 5# to increase success with holding and manipulating dishes and pots/pain for cooking.  Baseline:  Goal status: In progress  5.  Pt will be able to complete 9 hole peg test with RUE in 2 min time limit to demonstrate increased fine motor control as needed for ADLs/IADLs.  Baseline: able to place 2 pegs in 2 min time limit  Goal status: In progress   ASSESSMENT:  CLINICAL IMPRESSION: Pt demonstrating improved coordination with precision pinch, rotation, and tripod pinch with thick jigsaw puzzle pieces and pipe cleaner.  Pt continues to have difficulty picking up thread of flatter items from table top due to decreased precision pinch and impaired sensation.  Pt demonstrating improved finger coordination and isolated movements of index, long finger, and thumb.   PERFORMANCE DEFICITS in functional skills including ADLs, IADLs, coordination, dexterity, sensation, ROM, strength, FMC, GMC, balance,  body mechanics, decreased knowledge of use of DME, and UE functional use and psychosocial skills including environmental adaptation, habits, and routines and behaviors.   IMPAIRMENTS are limiting patient from ADLs, IADLs, and work.   COMORBIDITIES may have co-morbidities  that affects occupational performance. Patient will benefit from skilled OT  to address above impairments and improve overall function.  MODIFICATION OR ASSISTANCE TO COMPLETE EVALUATION: Min-Moderate modification of tasks or assist with assess necessary to complete an evaluation.  OT OCCUPATIONAL PROFILE AND HISTORY: Detailed assessment: Review of records and additional review of physical, cognitive, psychosocial history related to current functional performance.  CLINICAL DECISION MAKING: Moderate - several treatment options, min-mod task modification necessary  REHAB POTENTIAL: Good  EVALUATION COMPLEXITY: Moderate    PLAN: OT FREQUENCY: 2x/week  OT DURATION: 6 weeks  PLANNED INTERVENTIONS: self care/ADL training, therapeutic exercise, therapeutic activity, neuromuscular re-education, manual therapy, passive range of motion, balance training, functional mobility training, splinting, electrical stimulation, ultrasound, compression bandaging, moist heat, cryotherapy, patient/family education, psychosocial skills training, energy conservation, coping strategies training, and DME and/or AE instructions  RECOMMENDED OTHER SERVICES: N/A  CONSULTED AND AGREED WITH PLAN OF CARE: Patient and family member/caregiver  PLAN FOR NEXT SESSION: to see ortho PT for shoulder rehab - being followed by Dr. Griffin Basil  Engage in wrist ROM and hand opening/closing and attempt finger isolation exercises/activities. Gross grasp and reach activities.  Review theraputty exercises.  Massage (as appropriate) and tone management.   Simonne Come, OTR/L 05/13/2022, 10:19 AM

## 2022-05-13 NOTE — Telephone Encounter (Signed)
Advised via MyChart.

## 2022-05-13 NOTE — Telephone Encounter (Signed)
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  This is why she has had the issues with her strokes.

## 2022-05-15 ENCOUNTER — Ambulatory Visit: Payer: Federal, State, Local not specified - PPO | Admitting: Occupational Therapy

## 2022-05-15 DIAGNOSIS — M6281 Muscle weakness (generalized): Secondary | ICD-10-CM | POA: Diagnosis not present

## 2022-05-15 DIAGNOSIS — R482 Apraxia: Secondary | ICD-10-CM | POA: Diagnosis not present

## 2022-05-15 DIAGNOSIS — R278 Other lack of coordination: Secondary | ICD-10-CM

## 2022-05-15 DIAGNOSIS — R208 Other disturbances of skin sensation: Secondary | ICD-10-CM

## 2022-05-15 DIAGNOSIS — I69351 Hemiplegia and hemiparesis following cerebral infarction affecting right dominant side: Secondary | ICD-10-CM

## 2022-05-15 DIAGNOSIS — R471 Dysarthria and anarthria: Secondary | ICD-10-CM | POA: Diagnosis not present

## 2022-05-15 DIAGNOSIS — R4701 Aphasia: Secondary | ICD-10-CM | POA: Diagnosis not present

## 2022-05-15 NOTE — Therapy (Signed)
OUTPATIENT OCCUPATIONAL THERAPY  Treatment Note  Patient Name: Chloe Johnson MRN: [id number] DOB:1955/06/06, 66 y.o., female Today's Date: 05/15/2022  PCP: Charlane Ferretti, MD REFERRING PROVIDER: Izora Ribas, MD    OT End of Session - 05/15/22 1036     Visit Number 19    Number of Visits 25    Date for OT Re-Evaluation 06/06/22    Authorization Type [employer]    OT Start Time 1017    OT Stop Time 1100    OT Time Calculation (min) 43 min    Activity Tolerance Patient tolerated treatment well    Behavior During Therapy WFL for tasks assessed/performed                           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/16/2013   L3-4/notes 11/24/2013, arms, neck   Exertional asthma    GERD (gastroesophageal reflux disease)    Heart attack ([place]) 01/18/2022   Hypertension    Kidney stones    "have always passed them"   Neck pain    Osteomyelitis ([place]) 11/23/2013   osteomyelitis, discitis    Sleep concern    uses Prozac for sleep    Stroke ([place]) 01/21/2022   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; left with aphasia, dysarthria, and apraxia   Past Surgical History:  Procedure Laterality Date   BREAST CYST EXCISION Left 2010   "polypectomy"   IR CT HEAD LTD  01/21/2022   IR CT HEAD LTD  01/28/2022   IR PERCUTANEOUS ART THROMBECTOMY/INFUSION INTRACRANIAL INC DIAG ANGIO  01/21/2022   IR PERCUTANEOUS ART THROMBECTOMY/INFUSION INTRACRANIAL INC DIAG ANGIO  01/28/2022   IR US GUIDE VASC ACCESS RIGHT  01/22/2022   PICC LINE PLACE PERIPHERAL ([place] [facility]) Right    for use of Levaquin & Vancomycin, of note: she reports that she had a "flulike feeling"    RADIOLOGY WITH ANESTHESIA N/A 01/21/2022   Procedure: IR WITH ANESTHESIA;  Surgeon: Luanne Bras, MD;  Location: [facility];  Service: Radiology;  Laterality: N/A;   RADIOLOGY WITH ANESTHESIA N/A 01/28/2022   Procedure: IR WITH ANESTHESIA;  Surgeon: Radiologist, Medication, MD;   Location: [facility];  Service: Radiology;  Laterality: N/A;   [place]   Patient Active Problem List   Diagnosis Date Noted   Aphasia 03/31/2022   Snores 03/31/2022   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   UTI (urinary tract infection) 02/04/2022   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   Stroke determined by clinical assessment ([place]) 01/28/2022   Middle cerebral artery embolism, left 01/28/2022   H/O ischemic left MCA stroke    Acute respiratory failure (HCC)    CVA (cerebral vascular accident) ([place]) 01/21/2022   NSTEMI (non-ST elevated myocardial infarction) ([place]) 01/21/2022   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   HFrEF (heart failure with reduced ejection fraction) ([place]) 01/21/2022   Hyperlipidemia 01/21/2022   Lumbar stenosis 01/21/2022   Stroke (cerebrum) ([place]) 01/21/2022   S/P lumbar spinal fusion 11/15/2014   Lumbar discitis 11/29/2013   Discitis 11/24/2013   Diarrhea 11/17/2013   Osteomyelitis of lumbar spine ([place]) 11/16/2013   Hypertension    Asthma    Anxiety     ONSET DATE: 01/28/22  REFERRING DIAG: I63.9 (ICD-10-CM) - Cerebral infarction, unspecified   THERAPY DIAG:  Hemiplegia and hemiparesis following  cerebral infarction affecting right dominant side (HCC)  Muscle weakness (generalized)  Other lack of coordination  Other disturbances of skin sensation  Rationale for Evaluation and Treatment Rehabilitation  SUBJECTIVE:   SUBJECTIVE STATEMENT: Pt reports that she cut her index finger on her R hand when putting up a Christmas wreath, but was unaware until she saw blood on the counter.   Pt accompanied by: self  PERTINENT HISTORY: PMH: HTN, cervical radiculopathy, hepatitis, osteomyelitis/discitis, lumbar stenosis with foot drop anxiety, GERD, insomnia  PRECAUTIONS: Fall  WEIGHT BEARING RESTRICTIONS  was awaiting spinal fusion surgery, limit bending, twisting, lifting >10#  PAIN:   Are you having pain? Yes: NPRS scale: 1-2/10 Pain location: R hand, particularly index and long finger Pain description: tight Aggravating factors: unknown Relieving factors: massage  FALLS: Has patient fallen in last 6 months? Yes. Number of falls 2  LIVING ENVIRONMENT: Lives with: lives with their spouse Lives in: House/apartment Stairs: Yes: Internal: full flights of steps; on right going up and External: 7 steps; on left going up Has following equipment at home: Single point cane, Walker - 2 wheeled, Walker - 4 wheeled, shower chair, and bed side commode  PLOF: Independent  PATIENT GOALS I want my arm back.    OBJECTIVE:   HAND DOMINANCE: Right  ADLs: Transfers/ambulation related to ADLs: Mod I with no use of  Eating: Husband assists with cutting, feeding self with L hand Grooming: utilizing L hand with opening and brushing teeth UB Dressing: unable to manage buttons, otherwise Mod I LB Dressing: unable to tie shoes, just slipping on toes Toileting: Mod I with use of L hand Bathing: Mod I, utilizing L hand to shave Tub Shower transfers: Mod I Equipment: Shower seat with back, Walk in shower, and bed side commode   IADLs: Light housekeeping: laundry and hanging up clothes, wipes counters/sink, needs assist to fold clothing Meal Prep: no, did not cook pre-stroke Medication management: husband opens pill bottles Handwriting: unable  MOBILITY STATUS: Needs Assist: Supervision - Mod I without use of AD  POSTURE COMMENTS:  No Significant postural limitations  ACTIVITY TOLERANCE: Activity tolerance: WNL for tasks assessed on eval  FUNCTIONAL OUTCOME MEASURES: FOTO: 49  UPPER EXTREMITY ROM     Active ROM Right eval Left eval  Shoulder flexion WNL WNL  Shoulder abduction    Shoulder adduction    Shoulder extension    Shoulder internal rotation    Shoulder external rotation    Elbow flexion    Elbow extension -5 WNL  Wrist flexion 38 WNL  Wrist extension  50 WNL  Wrist ulnar deviation    Wrist radial deviation    Wrist pronation    Wrist supination    (Blank rows = not tested)   UPPER EXTREMITY MMT:     MMT Right eval Left eval  Shoulder flexion 4+/5   Shoulder abduction    Shoulder adduction    Shoulder extension    Shoulder internal rotation    Shoulder external rotation    Middle trapezius    Lower trapezius    Elbow flexion 4+/5   Elbow extension 4+/5   Wrist flexion    Wrist extension    Wrist ulnar deviation    Wrist radial deviation    Wrist pronation    Wrist supination    (Blank rows = not tested)  HAND FUNCTION: Loose gross grasp, unable to squeeze dynamometer  COORDINATION: Box and Blocks:  Right 10 blocks, Left 53 blocks Modified performance with RUE  with therapist holding blocks in palm and pt able to complete gross grasp to pick up one at a time from OT palm of hand, minimal hand opening/closing 04/02/22: Right: 16 blocks  SENSATION: Light touch: WFL Stereognosis: Impaired  Hot/Cold: Impaired  Proprioception: Impaired   COGNITION: Overall cognitive status: Within functional limits for tasks assessed  VISION: Subjective report: Pt reports that her R eye does not close as well and that she feels that her L eye is compensating for her R eye. Baseline vision: Wears glasses all the time  VISION ASSESSMENT: To be further assessed in functional context  --------------------------------------------------------------------------------------------------------------------------------------------------------------------- (objective measures above completed at initial evaluation unless otherwise dated)  TODAY'S TREATMENT:  05/15/22 Coordination: picking up rings from table top with focus on pincer to gross grasp to pick up ring from table top. Pt unable to pick up ring from table top, therefore placed second ring on top to allow for increased height of item.  Pt then able to pick up with attempts at pincer  grasp, however requiring gross grasp.  Pt then removing rings from stacked cones with focus on use of pincer grasp.  Pt demonstrating increased pincer grasp when removing from stacked cones due to ability to get fingers under ring. Jenga: engaged in [place] blocks with focus on variety of different grasps, progressing from stacking to unstacking.  OT then challenged pt to pick up jenga blocks and rotate to standing them upright. Focus on precision and 3 jaw chuck pinch as well as wrist extension, ulnar deviation, and coordination within fingers when standing pieces upright.     05/13/22 Lacing: lacing beads with picking up string and threading with RUE while holding large bead in L hand.  Pt requiring increased time and dragging of string to edge of table to pick it up in pincer grasp.  Pt alternating between pincer and gross grasp with string.  OT modified to use of pipe cleaner (instead of string) to challenge pincer grasp with picking up from table top.  Pt continues with difficulty, but improvements with picking up from table top. Resistance Clothespins 1,2 4# with RUE for mid functional reaching and sustained pinch. Pt req'd min cues for movement pattern and focus on pinch vs gross grasp. Coordination: picking up, turning over, and placing large jigsaw puzzle pieces with RUE.  Pt demonstrating mod difficulty with picking up and turning over jigsaw pieces, however much improved than coins due to thickness of pieces.  Pt able to rotate them in hand with intermittent use of table top to place in correct spaces.  OT challenging pt to push pieces into place with index finger to challenge isolated finger extension.    05/09/22 Coordination: placing large pegs into resistive foam board. Pt demonstrating increased rotation and motor control when rotating peg from tip pinch to tripod pinch to push into peg board.  OT increased challenge to incorporating rotation when removing pegs. Pt able to rotate 50%,  demonstrating improved intrinsic control and coordination, however unable to fully rotate.  Pt dropping 60% of pegs with initial removal and attempt at rotation. [place]: attempting to rotate small dice in fingers, however with decreased ability to coordinate movements.  Modified task to stacking small dice with improved control.  OT challenged pt in lacing activity.  Pt demonstrating improved precision pinch with lacing and ability to coordinate wrist and finger movements with lacing.      PATIENT EDUCATION: Education details: ongoing education with focus on coordination and progressive ROM Person educated: Patient  Education method: Explanation, Demonstration, and Verbal cues Education comprehension: verbalized understanding and needs further education   HOME EXERCISE PROGRAM: Fine motor coordination handout  Access Code: [email or web address] URL: [email or web address] Date: 05/02/2022 Prepared by: [facility]  Exercises - Putty Squeezes  - 1 x daily - 1 sets - 10 reps - Rolling Putty on Table  - 1 x daily - 1 sets - 10 reps - Tip Pinch with Putty  - 1 x daily - 1 sets - 10 reps - 3-Point Pinch with Putty  - 1 x daily - 1 sets - 10 reps - Finger Pinch and Pull with Putty  - 1 x daily - 1 sets - 10 reps - Seated Digit Tendon Gliding  - 1 x daily - 1 sets - 10 reps      GOALS: Goals reviewed with patient? Yes  SHORT TERM GOALS: Target date: 05/16/22   Pt will be independent with advanced HEP for fine and gross motor control.  Baseline:  Goal status: Met - 05/15/22  2.   Pt will demonstrate improved UE functional use for ADLs as evidenced by increasing box/ blocks score by 4 blocks with RUE.  Baseline: R: 16 blocks and L: 53  Goal status: Met - R: 20 blocks on 05/06/22  3.   Pt will demonstrate and verbalize safe mobility/engagement of RUE s/p rotator cuff tear during ADLs/IADLs.  Baseline:  Goal status: Met - 05/15/22   LONG TERM  GOALS: Target date: 06/06/22  Pt will demonstrate increased hand function and grasp to open containers (jars, medications) and verbalize understanding of AE PRN to increase ease, safety, and independence with opening containers. Baseline:  Goal status: In progress  2.  Pt will demonstrate use of BUE together to engage in functional IADL tasks (simple meal prep, folding laundry, etc). Baseline:  Goal status: In progress  3.  Pt will demonstrate increased functional reach and grasp to obtain or place light weight item from/on moderate height shelf to simulate IADLs. Baseline:  Goal status: In progress  4. Pt will increase RUE grip strength by 5# to increase success with holding and manipulating dishes and pots/pain for cooking.  Baseline:  Goal status: In progress  5.  Pt will be able to complete 9 hole peg test with RUE in 2 min time limit to demonstrate increased fine motor control as needed for ADLs/IADLs.  Baseline: able to place 2 pegs in 2 min time limit  Goal status: In progress   ASSESSMENT:  CLINICAL IMPRESSION: Pt demonstrating improved coordination with precision pinch, rotation, and tripod pinch with rings from slightly elevated surface and Jenga blocks.  Pt continues to have difficulty picking up small/flat items from table top due to decreased precision pinch and impaired sensation.  Pt with no issues with shoulder pain during session, is demonstrating good motor control and engagement in ADLs/IADLs.  PERFORMANCE DEFICITS in functional skills including ADLs, IADLs, coordination, dexterity, sensation, ROM, strength, FMC, GMC, balance, body mechanics, decreased knowledge of use of DME, and UE functional use and psychosocial skills including environmental adaptation, habits, and routines and behaviors.   IMPAIRMENTS are limiting patient from ADLs, IADLs, and work.   COMORBIDITIES may have co-morbidities  that affects occupational performance. Patient will benefit from skilled  OT to address above impairments and improve overall function.  MODIFICATION OR ASSISTANCE TO COMPLETE EVALUATION: Min-Moderate modification of tasks or assist with assess necessary to complete an evaluation.  OT OCCUPATIONAL PROFILE AND HISTORY:  Detailed assessment: Review of records and additional review of physical, cognitive, psychosocial history related to current functional performance.  CLINICAL DECISION MAKING: Moderate - several treatment options, min-mod task modification necessary  REHAB POTENTIAL: Good  EVALUATION COMPLEXITY: Moderate    PLAN: OT FREQUENCY: 2x/week  OT DURATION: 6 weeks  PLANNED INTERVENTIONS: self care/ADL training, therapeutic exercise, therapeutic activity, neuromuscular re-education, manual therapy, passive range of motion, balance training, functional mobility training, splinting, electrical stimulation, ultrasound, compression bandaging, moist heat, cryotherapy, patient/family education, psychosocial skills training, energy conservation, coping strategies training, and DME and/or AE instructions  RECOMMENDED OTHER SERVICES: N/A  CONSULTED AND AGREED WITH PLAN OF CARE: Patient and family member/caregiver  PLAN FOR NEXT SESSION: to see ortho PT for shoulder rehab - being followed by Dr. Griffin Basil  Engage in wrist ROM and hand opening/closing and attempt finger isolation exercises/activities. Gross grasp and reach activities.  Review theraputty exercises.  Massage (as appropriate) and tone management.   Simonne Come, OTR/L 05/15/2022, 10:36 AM

## 2022-05-20 NOTE — Progress Notes (Signed)
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 Her identity was confirmed with two unique identifiers.  I informed her that the genetic test for inherited hypercoagulable states screened for Factor V Leiden mutation, prothrombin (F2 c.*97G>A) mutation or deficiencies in protein C and protein S activity. 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  Explained to her that her mutations in F2 gene and Protein S inhibit the anticoagulation cascade, thus leading to a higher incidence of blood clots in association with other risk factors including surgery or increased immobility from travel or hospitalizations. It is highly likely that this genotype increased her risk of having a stroke. She verbalized understanding.  It is recommended that her daughters, ages 53, 76 and son age 91 undergo genetic testing for F2 mutation as well as determine their protein S activity levels. She tells me that one daughter live sin [place] and the other two children are in [place]. She will discuss her results with them so they can get undergo screening. Also advised her to see a hematologist so that she can be managed and treated appropriately. She already has an appointment on 05/05/22  to see Dr. Myna Hidalgo.  Sidney Ace, Ph.D, [facility] Clinical Molecular Geneticist

## 2022-05-21 ENCOUNTER — Ambulatory Visit: Payer: Federal, State, Local not specified - PPO | Admitting: Occupational Therapy

## 2022-05-21 ENCOUNTER — Ambulatory Visit: Payer: Federal, State, Local not specified - PPO

## 2022-05-28 ENCOUNTER — Ambulatory Visit: Payer: Federal, State, Local not specified - PPO | Attending: Physical Medicine and Rehabilitation

## 2022-05-28 ENCOUNTER — Ambulatory Visit: Payer: Federal, State, Local not specified - PPO | Admitting: Occupational Therapy

## 2022-05-28 DIAGNOSIS — R208 Other disturbances of skin sensation: Secondary | ICD-10-CM | POA: Diagnosis not present

## 2022-05-28 DIAGNOSIS — R278 Other lack of coordination: Secondary | ICD-10-CM | POA: Diagnosis not present

## 2022-05-28 DIAGNOSIS — M75111 Incomplete rotator cuff tear or rupture of right shoulder, not specified as traumatic: Secondary | ICD-10-CM | POA: Diagnosis not present

## 2022-05-28 DIAGNOSIS — I69351 Hemiplegia and hemiparesis following cerebral infarction affecting right dominant side: Secondary | ICD-10-CM

## 2022-05-28 DIAGNOSIS — M6281 Muscle weakness (generalized): Secondary | ICD-10-CM | POA: Diagnosis not present

## 2022-05-28 DIAGNOSIS — R41841 Cognitive communication deficit: Secondary | ICD-10-CM | POA: Diagnosis not present

## 2022-05-28 DIAGNOSIS — R471 Dysarthria and anarthria: Secondary | ICD-10-CM | POA: Insufficient documentation

## 2022-05-28 DIAGNOSIS — R4701 Aphasia: Secondary | ICD-10-CM | POA: Diagnosis not present

## 2022-05-28 DIAGNOSIS — R482 Apraxia: Secondary | ICD-10-CM | POA: Diagnosis not present
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
## 2022-05-28 NOTE — Therapy (Signed)
OUTPATIENT OCCUPATIONAL THERAPY  Treatment Note  Patient Name: Chloe Johnson MRN: [id number] DOB:1955/08/26, 67 y.o., female Today's Date: 05/28/2022  PCP: Charlane Ferretti, MD REFERRING PROVIDER: Izora Ribas, MD    OT End of Session - 05/28/22 1405     Visit Number 20    Number of Visits 25    Date for OT Re-Evaluation 06/06/22    Authorization Type [employer]    OT Start Time 1117    OT Stop Time 1445    OT Time Calculation (min) 42 min    Activity Tolerance Patient tolerated treatment well    Behavior During Therapy [facility] for tasks assessed/performed                           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/16/2013   L3-4/notes 11/24/2013, arms, neck   Exertional asthma    GERD (gastroesophageal reflux disease)    Heart attack ([place]) 01/18/2022   Hypertension    Kidney stones    "have always passed them"   Neck pain    Osteomyelitis ([place]) 11/23/2013   osteomyelitis, discitis    Sleep concern    uses Prozac for sleep    Stroke ([place]) 01/21/2022   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; left with aphasia, dysarthria, and apraxia   Past Surgical History:  Procedure Laterality Date   BREAST CYST EXCISION Left 2010   "polypectomy"   IR CT HEAD LTD  01/21/2022   IR CT HEAD LTD  01/28/2022   IR PERCUTANEOUS ART THROMBECTOMY/INFUSION INTRACRANIAL INC DIAG ANGIO  01/21/2022   IR PERCUTANEOUS ART THROMBECTOMY/INFUSION INTRACRANIAL INC DIAG ANGIO  01/28/2022   IR US GUIDE VASC ACCESS RIGHT  01/22/2022   PICC LINE PLACE PERIPHERAL ([place] HX) Right    for use of Levaquin & Vancomycin, of note: she reports that she had a "flulike feeling"    RADIOLOGY WITH ANESTHESIA N/A 01/21/2022   Procedure: IR WITH ANESTHESIA;  Surgeon: Luanne Bras, MD;  Location: [place];  Service: Radiology;  Laterality: N/A;   RADIOLOGY WITH ANESTHESIA N/A 01/28/2022   Procedure: IR WITH ANESTHESIA;  Surgeon: Radiologist, Medication, MD;   Location: [place];  Service: Radiology;  Laterality: N/A;   [place]   Patient Active Problem List   Diagnosis Date Noted   Aphasia 03/31/2022   Snores 03/31/2022   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   UTI (urinary tract infection) 02/04/2022   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   Stroke determined by clinical assessment ([place]) 01/28/2022   Middle cerebral artery embolism, left 01/28/2022   H/O ischemic left MCA stroke    Acute respiratory failure (HCC)    CVA (cerebral vascular accident) ([place]) 01/21/2022   NSTEMI (non-ST elevated myocardial infarction) ([place]) 01/21/2022   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   HFrEF (heart failure with reduced ejection fraction) ([place]) 01/21/2022   Hyperlipidemia 01/21/2022   Lumbar stenosis 01/21/2022   Stroke (cerebrum) ([place]) 01/21/2022   S/P lumbar spinal fusion 11/15/2014   Lumbar discitis 11/29/2013   Discitis 11/24/2013   Diarrhea 11/17/2013   Osteomyelitis of lumbar spine ([place]) 11/16/2013   Hypertension    Asthma    Anxiety     ONSET DATE: 01/28/22  REFERRING DIAG: I63.9 (ICD-10-CM) - Cerebral infarction, unspecified   THERAPY DIAG:  Hemiplegia and hemiparesis following  cerebral infarction affecting right dominant side (HCC)  Muscle weakness (generalized)  Other lack of coordination  Other disturbances of skin sensation  Rationale for Evaluation and Treatment Rehabilitation  SUBJECTIVE:   SUBJECTIVE STATEMENT: Pt reports that her whole family had norovirus over Christmas, but that they are all feeling better this week. Pt accompanied by: self  PERTINENT HISTORY: PMH: HTN, cervical radiculopathy, hepatitis, osteomyelitis/discitis, lumbar stenosis with foot drop anxiety, GERD, insomnia  PRECAUTIONS: Fall  WEIGHT BEARING RESTRICTIONS  was awaiting spinal fusion surgery, limit bending, twisting, lifting >10#  PAIN:  Are you having pain? Yes: NPRS  scale: 2/10 Pain location: R shoulder Pain description: aching Aggravating factors: PT appt this morning Relieving factors: massage  FALLS: Has patient fallen in last 6 months? Yes. Number of falls 2  LIVING ENVIRONMENT: Lives with: lives with their spouse Lives in: House/apartment Stairs: Yes: Internal: full flights of steps; on right going up and External: 7 steps; on left going up Has following equipment at home: Single point cane, Walker - 2 wheeled, Walker - 4 wheeled, shower chair, and bed side commode  PLOF: Independent  PATIENT GOALS I want my arm back.    OBJECTIVE:   HAND DOMINANCE: Right  ADLs: Transfers/ambulation related to ADLs: Mod I with no use of  Eating: Husband assists with cutting, feeding self with L hand Grooming: utilizing L hand with opening and brushing teeth UB Dressing: unable to manage buttons, otherwise Mod I LB Dressing: unable to tie shoes, just slipping on toes Toileting: Mod I with use of L hand Bathing: Mod I, utilizing L hand to shave Tub Shower transfers: Mod I Equipment: Shower seat with back, Walk in shower, and bed side commode   IADLs: Light housekeeping: laundry and hanging up clothes, wipes counters/sink, needs assist to fold clothing Meal Prep: no, did not cook pre-stroke Medication management: husband opens pill bottles Handwriting: unable  MOBILITY STATUS: Needs Assist: Supervision - Mod I without use of AD  POSTURE COMMENTS:  No Significant postural limitations  ACTIVITY TOLERANCE: Activity tolerance: WNL for tasks assessed on eval  FUNCTIONAL OUTCOME MEASURES: FOTO: 49  UPPER EXTREMITY ROM     Active ROM Right eval Left eval  Shoulder flexion WNL WNL  Shoulder abduction    Shoulder adduction    Shoulder extension    Shoulder internal rotation    Shoulder external rotation    Elbow flexion    Elbow extension -5 WNL  Wrist flexion 38 WNL  Wrist extension 50 WNL  Wrist ulnar deviation    Wrist radial  deviation    Wrist pronation    Wrist supination    (Blank rows = not tested)   UPPER EXTREMITY MMT:     MMT Right eval Left eval  Shoulder flexion 4+/5   Shoulder abduction    Shoulder adduction    Shoulder extension    Shoulder internal rotation    Shoulder external rotation    Middle trapezius    Lower trapezius    Elbow flexion 4+/5   Elbow extension 4+/5   Wrist flexion    Wrist extension    Wrist ulnar deviation    Wrist radial deviation    Wrist pronation    Wrist supination    (Blank rows = not tested)  HAND FUNCTION: Loose gross grasp, unable to squeeze dynamometer  COORDINATION: Box and Blocks:  Right 10 blocks, Left 53 blocks Modified performance with RUE with therapist holding blocks in palm and pt able to complete gross grasp  to pick up one at a time from OT palm of hand, minimal hand opening/closing 04/02/22: Right: 16 blocks  SENSATION: Light touch: WFL Stereognosis: Impaired  Hot/Cold: Impaired  Proprioception: Impaired   COGNITION: Overall cognitive status: Within functional limits for tasks assessed  VISION: Subjective report: Pt reports that her R eye does not close as well and that she feels that her L eye is compensating for her R eye. Baseline vision: Wears glasses all the time  VISION ASSESSMENT: To be further assessed in functional context  --------------------------------------------------------------------------------------------------------------------------------------------------------------------- (objective measures above completed at initial evaluation unless otherwise dated)  TODAY'S TREATMENT:  05/28/22 Medication management: Opening variety of medication bottles with use of R hand to maintain grasp on pill bottles while opening with L hand.  Pt then pouring pills into L hand with use of R hand and then pt placing pills into slots with L hand.  OT encouraged pt to attempt to place in pill box slots with use of R hand.  Pt  dropping 4/21 due to decreased sensation. OT encouraging pt to attempt use of RUE with large pills. Coordination: placing small beads on vertical toothpicks, dropping >50% especially as attempting to lift RUE to place onto vertical target.  Due to decreased sensation and ability to grade pressure, pt with decreased control and sustained grasp on beads when attempting to place on vertical target.  Downgraded task to picking up small beads and placing them into cup with improved success.   Hand Gripper: with RUE on level 5# with green spring. Pt picked up 1 inch blocks with gripper with zero drops and min difficulty with sustained grasp to place blocks into container.      05/15/22 Coordination: picking up rings from table top with focus on pincer to gross grasp to pick up ring from table top. Pt unable to pick up ring from table top, therefore placed second ring on top to allow for increased height of item.  Pt then able to pick up with attempts at pincer grasp, however requiring gross grasp.  Pt then removing rings from stacked cones with focus on use of pincer grasp.  Pt demonstrating increased pincer grasp when removing from stacked cones due to ability to get fingers under ring. Jenga: engaged in [place] blocks with focus on variety of different grasps, progressing from stacking to unstacking.  OT then challenged pt to pick up jenga blocks and rotate to standing them upright. Focus on precision and 3 jaw chuck pinch as well as wrist extension, ulnar deviation, and coordination within fingers when standing pieces upright.     05/13/22 Lacing: lacing beads with picking up string and threading with RUE while holding large bead in L hand.  Pt requiring increased time and dragging of string to edge of table to pick it up in pincer grasp.  Pt alternating between pincer and gross grasp with string.  OT modified to use of pipe cleaner (instead of string) to challenge pincer grasp with picking up from table  top.  Pt continues with difficulty, but improvements with picking up from table top. Resistance Clothespins 1,2 4# with RUE for mid functional reaching and sustained pinch. Pt req'd min cues for movement pattern and focus on pinch vs gross grasp. Coordination: picking up, turning over, and placing large jigsaw puzzle pieces with RUE.  Pt demonstrating mod difficulty with picking up and turning over jigsaw pieces, however much improved than coins due to thickness of pieces.  Pt able to rotate them in hand  with intermittent use of table top to place in correct spaces.  OT challenging pt to push pieces into place with index finger to challenge isolated finger extension.    PATIENT EDUCATION: Education details: ongoing education with focus on coordination and progressive ROM Person educated: Patient Education method: Explanation, Demonstration, and Verbal cues Education comprehension: verbalized understanding and needs further education   HOME EXERCISE PROGRAM: Fine motor coordination handout  Access Code: [email or web address] URL: [email or web address] Date: 05/28/2022 Prepared by: [facility]  Exercises - Putty Squeezes  - 1 x daily - 1 sets - 10 reps - Rolling Putty on Table  - 1 x daily - 1 sets - 10 reps - Tip Pinch with Putty  - 1 x daily - 1 sets - 10 reps - 3-Point Pinch with Putty  - 1 x daily - 1 sets - 10 reps - Finger Pinch and Pull with Putty  - 1 x daily - 1 sets - 10 reps - Seated Digit Tendon Gliding  - 1 x daily - 1 sets - 10 reps - Seated Wrist Flexion Extension PROM  - 1 x daily - 2 sets - 10 reps - 5-10 sec hold - Thumb MP Flexion Extension  - 1 x daily - 2 sets - 10 reps - 5-10 sec hold      GOALS: Goals reviewed with patient? Yes  SHORT TERM GOALS: Target date: 05/16/22   Pt will be independent with advanced HEP for fine and gross motor control.  Baseline:  Goal status: Met - 05/15/22  2.   Pt will demonstrate  improved UE functional use for ADLs as evidenced by increasing box/ blocks score by 4 blocks with RUE.  Baseline: R: 16 blocks and L: 53  Goal status: Met - R: 20 blocks on 05/06/22  3.   Pt will demonstrate and verbalize safe mobility/engagement of RUE s/p rotator cuff tear during ADLs/IADLs.  Baseline:  Goal status: Met - 05/15/22   LONG TERM GOALS: Target date: 06/06/22  Pt will demonstrate increased hand function and grasp to open containers (jars, medications) and verbalize understanding of AE PRN to increase ease, safety, and independence with opening containers. Baseline:  Goal status: In progress  2.  Pt will demonstrate use of BUE together to engage in functional IADL tasks (simple meal prep, folding laundry, etc). Baseline:  Goal status: In progress  3.  Pt will demonstrate increased functional reach and grasp to obtain or place light weight item from/on moderate height shelf to simulate IADLs. Baseline:  Goal status: In progress  4. Pt will increase RUE grip strength by 5# to increase success with holding and manipulating dishes and pots/pain for cooking.  Baseline:  Goal status: In progress  5.  Pt will be able to complete 9 hole peg test with RUE in 2 min time limit to demonstrate increased fine motor control as needed for ADLs/IADLs.  Baseline: able to place 2 pegs in 2 min time limit  Goal status: In progress   ASSESSMENT:  CLINICAL IMPRESSION: Pt demonstrating improved coordination with precision pinch, however frequent rotation when attempting to maintain sustained pinch on small beads.  Pt continues to have difficulty picking up small/flat items from table top due to impaired sensation and decreased ability to grade pressure, especially with more dynamic functional tasks.  Pt with no issues with shoulder pain during session, is demonstrating good motor control and engagement in ADLs/IADLs.  PERFORMANCE DEFICITS in functional skills including ADLs,  IADLs,  coordination, dexterity, sensation, ROM, strength, FMC, GMC, balance, body mechanics, decreased knowledge of use of DME, and UE functional use and psychosocial skills including environmental adaptation, habits, and routines and behaviors.   IMPAIRMENTS are limiting patient from ADLs, IADLs, and work.   COMORBIDITIES may have co-morbidities  that affects occupational performance. Patient will benefit from skilled OT to address above impairments and improve overall function.  MODIFICATION OR ASSISTANCE TO COMPLETE EVALUATION: Min-Moderate modification of tasks or assist with assess necessary to complete an evaluation.  OT OCCUPATIONAL PROFILE AND HISTORY: Detailed assessment: Review of records and additional review of physical, cognitive, psychosocial history related to current functional performance.  CLINICAL DECISION MAKING: Moderate - several treatment options, min-mod task modification necessary  REHAB POTENTIAL: Good  EVALUATION COMPLEXITY: Moderate    PLAN: OT FREQUENCY: 2x/week  OT DURATION: 6 weeks  PLANNED INTERVENTIONS: self care/ADL training, therapeutic exercise, therapeutic activity, neuromuscular re-education, manual therapy, passive range of motion, balance training, functional mobility training, splinting, electrical stimulation, ultrasound, compression bandaging, moist heat, cryotherapy, patient/family education, psychosocial skills training, energy conservation, coping strategies training, and DME and/or AE instructions  RECOMMENDED OTHER SERVICES: N/A  CONSULTED AND AGREED WITH PLAN OF CARE: Patient and family member/caregiver  PLAN FOR NEXT SESSION: to see ortho PT for shoulder rehab - being followed by Dr. Griffin Basil  Engage in wrist ROM and hand opening/closing and attempt finger isolation exercises/activities. Gross grasp and reach activities.  Review theraputty exercises.  Massage (as appropriate) and tone management.   Simonne Come, OTR/L 05/28/2022, 4:21  PM

## 2022-05-29 ENCOUNTER — Ambulatory Visit: Payer: Federal, State, Local not specified - PPO | Attending: Cardiovascular Disease | Admitting: *Deleted

## 2022-05-29 DIAGNOSIS — I513 Intracardiac thrombosis, not elsewhere classified: Secondary | ICD-10-CM

## 2022-05-29 DIAGNOSIS — I639 Cerebral infarction, unspecified: Secondary | ICD-10-CM | POA: Diagnosis not present

## 2022-05-29 LAB — POCT INR: INR: 1.9 — AB (ref 2.0–3.0)

## 2022-05-29 NOTE — Patient Instructions (Signed)
Description   Today take 1.5 tablets of warfarin then continue taking warfarin 1 tablet daily. Continue green leafy veggie to weekly. Recheck INR in 2 weeks.  [facility] [phone]

## 2022-05-30 ENCOUNTER — Ambulatory Visit: Payer: Federal, State, Local not specified - PPO

## 2022-05-30 ENCOUNTER — Ambulatory Visit: Payer: Federal, State, Local not specified - PPO | Admitting: Occupational Therapy

## 2022-05-30 DIAGNOSIS — M6281 Muscle weakness (generalized): Secondary | ICD-10-CM | POA: Diagnosis not present

## 2022-05-30 DIAGNOSIS — R482 Apraxia: Secondary | ICD-10-CM

## 2022-05-30 DIAGNOSIS — R208 Other disturbances of skin sensation: Secondary | ICD-10-CM | POA: Diagnosis not present

## 2022-05-30 DIAGNOSIS — R4701 Aphasia: Secondary | ICD-10-CM

## 2022-05-30 DIAGNOSIS — R41841 Cognitive communication deficit: Secondary | ICD-10-CM

## 2022-05-30 DIAGNOSIS — R278 Other lack of coordination: Secondary | ICD-10-CM | POA: Diagnosis not present

## 2022-05-30 DIAGNOSIS — R471 Dysarthria and anarthria: Secondary | ICD-10-CM

## 2022-05-30 DIAGNOSIS — I69351 Hemiplegia and hemiparesis following cerebral infarction affecting right dominant side: Secondary | ICD-10-CM | POA: Diagnosis not present
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
## 2022-05-30 NOTE — Therapy (Signed)
OUTPATIENT OCCUPATIONAL THERAPY  Treatment Note  Patient Name: Chloe Johnson MRN: [id number] DOB:01/22/1956, 67 y.o., female Today's Date: 05/30/2022  PCP: Charlane Ferretti, MD REFERRING PROVIDER: Izora Ribas, MD    OT End of Session - 05/30/22 1025     Visit Number 21    Number of Visits 25    Date for OT Re-Evaluation 06/06/22    Authorization Type [employer]    OT Start Time 1023    OT Stop Time 1103    OT Time Calculation (min) 40 min    Activity Tolerance Patient tolerated treatment well    Behavior During Therapy WFL for tasks assessed/performed                           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/16/2013   L3-4/notes 11/24/2013, arms, neck   Exertional asthma    GERD (gastroesophageal reflux disease)    Heart attack ([place]) 01/18/2022   Hypertension    Kidney stones    "have always passed them"   Neck pain    Osteomyelitis ([place]) 11/23/2013   osteomyelitis, discitis    Sleep concern    uses Prozac for sleep    Stroke ([place]) 01/21/2022   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; left with aphasia, dysarthria, and apraxia   Past Surgical History:  Procedure Laterality Date   BREAST CYST EXCISION Left 2010   "polypectomy"   IR CT HEAD LTD  01/21/2022   IR CT HEAD LTD  01/28/2022   IR PERCUTANEOUS ART THROMBECTOMY/INFUSION INTRACRANIAL INC DIAG ANGIO  01/21/2022   IR PERCUTANEOUS ART THROMBECTOMY/INFUSION INTRACRANIAL INC DIAG ANGIO  01/28/2022   IR US GUIDE VASC ACCESS RIGHT  01/22/2022   PICC LINE PLACE PERIPHERAL ([place] [facility]) Right    for use of Levaquin & Vancomycin, of note: she reports that she had a "flulike feeling"    RADIOLOGY WITH ANESTHESIA N/A 01/21/2022   Procedure: IR WITH ANESTHESIA;  Surgeon: Luanne Bras, MD;  Location: [place];  Service: Radiology;  Laterality: N/A;   RADIOLOGY WITH ANESTHESIA N/A 01/28/2022   Procedure: IR WITH ANESTHESIA;  Surgeon: Radiologist, Medication, MD;   Location: [place];  Service: Radiology;  Laterality: N/A;   [place]   Patient Active Problem List   Diagnosis Date Noted   Aphasia 03/31/2022   Snores 03/31/2022   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   UTI (urinary tract infection) 02/04/2022   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   Stroke determined by clinical assessment ([place]) 01/28/2022   Middle cerebral artery embolism, left 01/28/2022   H/O ischemic left MCA stroke    Acute respiratory failure (HCC)    CVA (cerebral vascular accident) ([place]) 01/21/2022   NSTEMI (non-ST elevated myocardial infarction) ([place]) 01/21/2022   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   HFrEF (heart failure with reduced ejection fraction) ([place]) 01/21/2022   Hyperlipidemia 01/21/2022   Lumbar stenosis 01/21/2022   Stroke (cerebrum) ([place]) 01/21/2022   S/P lumbar spinal fusion 11/15/2014   Lumbar discitis 11/29/2013   Discitis 11/24/2013   Diarrhea 11/17/2013   Osteomyelitis of lumbar spine ([place]) 11/16/2013   Hypertension    Asthma    Anxiety     ONSET DATE: 01/28/22  REFERRING DIAG: I63.9 (ICD-10-CM) - Cerebral infarction, unspecified   THERAPY DIAG:  Hemiplegia and hemiparesis following  cerebral infarction affecting right dominant side (HCC)  Muscle weakness (generalized)  Other lack of coordination  Other disturbances of skin sensation  Rationale for Evaluation and Treatment Rehabilitation  SUBJECTIVE:   SUBJECTIVE STATEMENT: Pt reports that her whole family had norovirus over Christmas, but that they are all feeling better this week. Pt accompanied by: self  PERTINENT HISTORY: PMH: HTN, cervical radiculopathy, hepatitis, osteomyelitis/discitis, lumbar stenosis with foot drop anxiety, GERD, insomnia  PRECAUTIONS: Fall  WEIGHT BEARING RESTRICTIONS  was awaiting spinal fusion surgery, limit bending, twisting, lifting >10#  PAIN:  Are you having pain? Yes: NPRS  scale: 2/10 Pain location: R shoulder Pain description: aching Aggravating factors: PT appt this morning Relieving factors: massage  FALLS: Has patient fallen in last 6 months? Yes. Number of falls 2  LIVING ENVIRONMENT: Lives with: lives with their spouse Lives in: House/apartment Stairs: Yes: Internal: full flights of steps; on right going up and External: 7 steps; on left going up Has following equipment at home: Single point cane, Walker - 2 wheeled, Walker - 4 wheeled, shower chair, and bed side commode  PLOF: Independent  PATIENT GOALS I want my arm back.    OBJECTIVE:   HAND DOMINANCE: Right  ADLs: Transfers/ambulation related to ADLs: Mod I with no use of  Eating: Husband assists with cutting, feeding self with L hand Grooming: utilizing L hand with opening and brushing teeth UB Dressing: unable to manage buttons, otherwise Mod I LB Dressing: unable to tie shoes, just slipping on toes Toileting: Mod I with use of L hand Bathing: Mod I, utilizing L hand to shave Tub Shower transfers: Mod I Equipment: Shower seat with back, Walk in shower, and bed side commode   IADLs: Light housekeeping: laundry and hanging up clothes, wipes counters/sink, needs assist to fold clothing Meal Prep: no, did not cook pre-stroke Medication management: husband opens pill bottles Handwriting: unable  MOBILITY STATUS: Needs Assist: Supervision - Mod I without use of AD  POSTURE COMMENTS:  No Significant postural limitations  ACTIVITY TOLERANCE: Activity tolerance: WNL for tasks assessed on eval  FUNCTIONAL OUTCOME MEASURES: FOTO: 49  UPPER EXTREMITY ROM     Active ROM Right eval Left eval  Shoulder flexion WNL WNL  Shoulder abduction    Shoulder adduction    Shoulder extension    Shoulder internal rotation    Shoulder external rotation    Elbow flexion    Elbow extension -5 WNL  Wrist flexion 38 WNL  Wrist extension 50 WNL  Wrist ulnar deviation    Wrist radial  deviation    Wrist pronation    Wrist supination    (Blank rows = not tested)   UPPER EXTREMITY MMT:     MMT Right eval Left eval  Shoulder flexion 4+/5   Shoulder abduction    Shoulder adduction    Shoulder extension    Shoulder internal rotation    Shoulder external rotation    Middle trapezius    Lower trapezius    Elbow flexion 4+/5   Elbow extension 4+/5   Wrist flexion    Wrist extension    Wrist ulnar deviation    Wrist radial deviation    Wrist pronation    Wrist supination    (Blank rows = not tested)  HAND FUNCTION: Loose gross grasp, unable to squeeze dynamometer  COORDINATION: Box and Blocks:  Right 10 blocks, Left 53 blocks Modified performance with RUE with therapist holding blocks in palm and pt able to complete gross grasp  to pick up one at a time from OT palm of hand, minimal hand opening/closing 04/02/22: Right: 16 blocks  SENSATION: Light touch: WFL Stereognosis: Impaired  Hot/Cold: Impaired  Proprioception: Impaired   COGNITION: Overall cognitive status: Within functional limits for tasks assessed  VISION: Subjective report: Pt reports that her R eye does not close as well and that she feels that her L eye is compensating for her R eye. Baseline vision: Wears glasses all the time  VISION ASSESSMENT: To be further assessed in functional context  --------------------------------------------------------------------------------------------------------------------------------------------------------------------- (objective measures above completed at initial evaluation unless otherwise dated)  TODAY'S TREATMENT:  05/30/22 Grasp and pinch: Stacking cones with focus on full grasp and motor control with grasp and release.  OT quickly transitioned pt to picking up large pegs with full grasp and challenging attempts to transition to pinch grip.  Pt requiring increased wrist extension when picking up pegs from upright position and moving to a different  hole in peg board.  Attempted precision/tip to tip pinch, however pt with increased difficulty due to impaired sensation with inability to modify or sustain pinch grip. Engaged in [place] tasks such as folding laundry and sorting items to simulate sorting utensils.  Pt requiring increased time and effort when folding pillow case, initially completing with only RUE.  OT educated on use of BUE in conjunction during routine, functional tasks to facilitate increased motor recovery.      05/28/22 Medication management: Opening variety of medication bottles with use of R hand to maintain grasp on pill bottles while opening with L hand.  Pt then pouring pills into L hand with use of R hand and then pt placing pills into slots with L hand.  OT encouraged pt to attempt to place in pill box slots with use of R hand.  Pt dropping 4/21 due to decreased sensation. OT encouraging pt to attempt use of RUE with large pills. Coordination: placing small beads on vertical toothpicks, dropping >50% especially as attempting to lift RUE to place onto vertical target.  Due to decreased sensation and ability to grade pressure, pt with decreased control and sustained grasp on beads when attempting to place on vertical target.  Downgraded task to picking up small beads and placing them into cup with improved success.   Hand Gripper: with RUE on level 5# with green spring. Pt picked up 1 inch blocks with gripper with zero drops and min difficulty with sustained grasp to place blocks into container.      05/15/22 Coordination: picking up rings from table top with focus on pincer to gross grasp to pick up ring from table top. Pt unable to pick up ring from table top, therefore placed second ring on top to allow for increased height of item.  Pt then able to pick up with attempts at pincer grasp, however requiring gross grasp.  Pt then removing rings from stacked cones with focus on use of pincer grasp.  Pt demonstrating increased pincer  grasp when removing from stacked cones due to ability to get fingers under ring. Jenga: engaged in [place] blocks with focus on variety of different grasps, progressing from stacking to unstacking.  OT then challenged pt to pick up jenga blocks and rotate to standing them upright. Focus on precision and 3 jaw chuck pinch as well as wrist extension, ulnar deviation, and coordination within fingers when standing pieces upright.     PATIENT EDUCATION: Education details: ongoing education with focus on coordination and progressive ROM Person educated: Patient  Education method: Explanation, Demonstration, and Verbal cues Education comprehension: verbalized understanding and needs further education   HOME EXERCISE PROGRAM: Fine motor coordination handout  Access Code: [email or web address] URL: [email or web address] Date: 05/28/2022 Prepared by: [facility] - Outpatient  Rehab - [facility]  Exercises - Putty Squeezes  - 1 x daily - 1 sets - 10 reps - Rolling Putty on Table  - 1 x daily - 1 sets - 10 reps - Tip Pinch with Putty  - 1 x daily - 1 sets - 10 reps - 3-Point Pinch with Putty  - 1 x daily - 1 sets - 10 reps - Finger Pinch and Pull with Putty  - 1 x daily - 1 sets - 10 reps - Seated Digit Tendon Gliding  - 1 x daily - 1 sets - 10 reps - Seated Wrist Flexion Extension PROM  - 1 x daily - 2 sets - 10 reps - 5-10 sec hold - Thumb MP Flexion Extension  - 1 x daily - 2 sets - 10 reps - 5-10 sec hold      GOALS: Goals reviewed with patient? Yes  SHORT TERM GOALS: Target date: 05/16/22   Pt will be independent with advanced HEP for fine and gross motor control.  Baseline:  Goal status: Met - 05/15/22  2.   Pt will demonstrate improved UE functional use for ADLs as evidenced by increasing box/ blocks score by 4 blocks with RUE.  Baseline: R: 16 blocks and L: 53  Goal status: Met - R: 20 blocks on 05/06/22  3.   Pt will demonstrate and verbalize safe mobility/engagement  of RUE s/p rotator cuff tear during ADLs/IADLs.  Baseline:  Goal status: Met - 05/15/22   LONG TERM GOALS: Target date: 06/06/22  Pt will demonstrate increased hand function and grasp to open containers (jars, medications) and verbalize understanding of AE PRN to increase ease, safety, and independence with opening containers. Baseline:  Goal status: In progress  2.  Pt will demonstrate use of BUE together to engage in functional IADL tasks (simple meal prep, folding laundry, etc). Baseline:  Goal status: In progress  3.  Pt will demonstrate increased functional reach and grasp to obtain or place light weight item from/on moderate height shelf to simulate IADLs. Baseline:  Goal status: In progress  4. Pt will increase RUE grip strength by 5# to increase success with holding and manipulating dishes and pots/pain for cooking.  Baseline:  Goal status: In progress  5.  Pt will be able to complete 9 hole peg test with RUE in 2 min time limit to demonstrate increased fine motor control as needed for ADLs/IADLs.  Baseline: able to place 2 pegs in 2 min time limit  Goal status: In progress   ASSESSMENT:  CLINICAL IMPRESSION: Pt demonstrating good gross motor control and ability to compensate for decreased sensation in precision pinch to utilize more gross grasp.  Pt frequently attempting typically bimanual tasks with just RUE, OT reiterated recommendation to engage in functional tasks and to attempt to recall technique in which she would have completed tasks prior to CVA.  PERFORMANCE DEFICITS in functional skills including ADLs, IADLs, coordination, dexterity, sensation, ROM, strength, FMC, GMC, balance, body mechanics, decreased knowledge of use of DME, and UE functional use and psychosocial skills including environmental adaptation, habits, and routines and behaviors.   IMPAIRMENTS are limiting patient from ADLs, IADLs, and work.   COMORBIDITIES may have co-morbidities  that affects  occupational performance. Patient will benefit from  skilled OT to address above impairments and improve overall function.  MODIFICATION OR ASSISTANCE TO COMPLETE EVALUATION: Min-Moderate modification of tasks or assist with assess necessary to complete an evaluation.  OT OCCUPATIONAL PROFILE AND HISTORY: Detailed assessment: Review of records and additional review of physical, cognitive, psychosocial history related to current functional performance.  CLINICAL DECISION MAKING: Moderate - several treatment options, min-mod task modification necessary  REHAB POTENTIAL: Good  EVALUATION COMPLEXITY: Moderate    PLAN: OT FREQUENCY: 2x/week  OT DURATION: 6 weeks  PLANNED INTERVENTIONS: self care/ADL training, therapeutic exercise, therapeutic activity, neuromuscular re-education, manual therapy, passive range of motion, balance training, functional mobility training, splinting, electrical stimulation, ultrasound, compression bandaging, moist heat, cryotherapy, patient/family education, psychosocial skills training, energy conservation, coping strategies training, and DME and/or AE instructions  RECOMMENDED OTHER SERVICES: N/A  CONSULTED AND AGREED WITH PLAN OF CARE: Patient and family member/caregiver  PLAN FOR NEXT SESSION: to see ortho PT for shoulder rehab - being followed by Dr. Everardo Pacific  Engage in wrist ROM and pinch and attempt finger isolation exercises/activities. Gross grasp and reach activities.  Hand gripper.  Massage (as appropriate) and tone management.   Rosalio Loud, OTR/L 05/30/2022, 10:26 AM

## 2022-06-03 ENCOUNTER — Ambulatory Visit: Payer: Federal, State, Local not specified - PPO | Admitting: Occupational Therapy

## 2022-06-03 ENCOUNTER — Ambulatory Visit: Payer: Federal, State, Local not specified - PPO

## 2022-06-03 DIAGNOSIS — R471 Dysarthria and anarthria: Secondary | ICD-10-CM | POA: Diagnosis not present

## 2022-06-03 DIAGNOSIS — M6281 Muscle weakness (generalized): Secondary | ICD-10-CM | POA: Diagnosis not present

## 2022-06-03 DIAGNOSIS — R4701 Aphasia: Secondary | ICD-10-CM

## 2022-06-03 DIAGNOSIS — R278 Other lack of coordination: Secondary | ICD-10-CM

## 2022-06-03 DIAGNOSIS — R482 Apraxia: Secondary | ICD-10-CM

## 2022-06-03 DIAGNOSIS — R208 Other disturbances of skin sensation: Secondary | ICD-10-CM

## 2022-06-03 DIAGNOSIS — R41841 Cognitive communication deficit: Secondary | ICD-10-CM

## 2022-06-03 DIAGNOSIS — I69351 Hemiplegia and hemiparesis following cerebral infarction affecting right dominant side: Secondary | ICD-10-CM | POA: Diagnosis not present

## 2022-06-03 NOTE — Therapy (Signed)
OUTPATIENT OCCUPATIONAL THERAPY  Treatment Note  Patient Name: Chloe Johnson MRN: [id number] DOB:12-11-1955, 67 y.o., female Today's Date: 06/03/2022  PCP: Thana Ates, MD REFERRING PROVIDER: Horton Chin, MD    OT End of Session - 06/03/22 1337     Visit Number 22    Number of Visits 25    Date for OT Re-Evaluation 06/06/22    Authorization Type [employer]    OT Start Time 1236    OT Stop Time 1316    OT Time Calculation (min) 40 min    Activity Tolerance Patient tolerated treatment well    Behavior During Therapy WFL for tasks assessed/performed                            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/16/2013   L3-4/notes 11/24/2013, arms, neck   Exertional asthma    GERD (gastroesophageal reflux disease)    Heart attack (HCC) 01/18/2022   Hypertension    Kidney stones    "have always passed them"   Neck pain    Osteomyelitis (HCC) 11/23/2013   osteomyelitis, discitis    Sleep concern    uses Prozac for sleep    Stroke (HCC) 01/21/2022   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; left with aphasia, dysarthria, and apraxia   Past Surgical History:  Procedure Laterality Date   BREAST CYST EXCISION Left 2010   "polypectomy"   IR CT HEAD LTD  01/21/2022   IR CT HEAD LTD  01/28/2022   IR PERCUTANEOUS ART THROMBECTOMY/INFUSION INTRACRANIAL INC DIAG ANGIO  01/21/2022   IR PERCUTANEOUS ART THROMBECTOMY/INFUSION INTRACRANIAL INC DIAG ANGIO  01/28/2022   IR US GUIDE VASC ACCESS RIGHT  01/22/2022   PICC LINE PLACE PERIPHERAL ([facility] HX) Right    for use of Levaquin & Vancomycin, of note: she reports that she had a "flulike feeling"    RADIOLOGY WITH ANESTHESIA N/A 01/21/2022   Procedure: IR WITH ANESTHESIA;  Surgeon: Julieanne Cotton, MD;  Location: [facility] OR;  Service: Radiology;  Laterality: N/A;   RADIOLOGY WITH ANESTHESIA N/A 01/28/2022   Procedure: IR WITH ANESTHESIA;  Surgeon: Radiologist, Medication, MD;   Location: [facility] OR;  Service: Radiology;  Laterality: N/A;   TONSILLECTOMY AND ADENOIDECTOMY  1975   TUBAL LIGATION  1999   Patient Active Problem List   Diagnosis Date Noted   Aphasia 03/31/2022   Snores 03/31/2022   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   UTI (urinary tract infection) 02/04/2022   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   Stroke determined by clinical assessment (HCC) 01/28/2022   Middle cerebral artery embolism, left 01/28/2022   H/O ischemic left MCA stroke    Acute respiratory failure (HCC)    CVA (cerebral vascular accident) (HCC) 01/21/2022   NSTEMI (non-ST elevated myocardial infarction) (HCC) 01/21/2022   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   HFrEF (heart failure with reduced ejection fraction) (HCC) 01/21/2022   Hyperlipidemia 01/21/2022   Lumbar stenosis 01/21/2022   Stroke (cerebrum) (HCC) 01/21/2022   S/P lumbar spinal fusion 11/15/2014   Lumbar discitis 11/29/2013   Discitis 11/24/2013   Diarrhea 11/17/2013   Osteomyelitis of lumbar spine (HCC) 11/16/2013   Hypertension    Asthma    Anxiety     ONSET DATE: 01/28/22  REFERRING DIAG: I63.9 (ICD-10-CM) - Cerebral infarction, unspecified   THERAPY DIAG:  Hemiplegia and hemiparesis  following cerebral infarction affecting right dominant side (HCC)  Muscle weakness (generalized)  Other lack of coordination  Other disturbances of skin sensation  Rationale for Evaluation and Treatment Rehabilitation  SUBJECTIVE:   SUBJECTIVE STATEMENT: "We need to get some more appointments scheduled." Pt accompanied by: self  PERTINENT HISTORY: PMH: HTN, cervical radiculopathy, hepatitis, osteomyelitis/discitis, lumbar stenosis with foot drop anxiety, GERD, insomnia  PRECAUTIONS: Fall  WEIGHT BEARING RESTRICTIONS  was awaiting spinal fusion surgery, limit bending, twisting, lifting >10#  PAIN:  Are you having pain? Yes: NPRS scale: 2/10 Pain location: R shoulder Pain description:  aching Aggravating factors: PT appt this morning Relieving factors: massage  FALLS: Has patient fallen in last 6 months? Yes. Number of falls 2  LIVING ENVIRONMENT: Lives with: lives with their spouse Lives in: House/apartment Stairs: Yes: Internal: full flights of steps; on right going up and External: 7 steps; on left going up Has following equipment at home: Single point cane, Walker - 2 wheeled, Walker - 4 wheeled, shower chair, and bed side commode  PLOF: Independent  PATIENT GOALS I want my arm back.    OBJECTIVE:   HAND DOMINANCE: Right  ADLs: Transfers/ambulation related to ADLs: Mod I with no use of  Eating: Husband assists with cutting, feeding self with L hand Grooming: utilizing L hand with opening and brushing teeth UB Dressing: unable to manage buttons, otherwise Mod I LB Dressing: unable to tie shoes, just slipping on toes Toileting: Mod I with use of L hand Bathing: Mod I, utilizing L hand to shave Tub Shower transfers: Mod I Equipment: Shower seat with back, Walk in shower, and bed side commode   IADLs: Light housekeeping: laundry and hanging up clothes, wipes counters/sink, needs assist to fold clothing Meal Prep: no, did not cook pre-stroke Medication management: husband opens pill bottles Handwriting: unable  MOBILITY STATUS: Needs Assist: Supervision - Mod I without use of AD  POSTURE COMMENTS:  No Significant postural limitations  ACTIVITY TOLERANCE: Activity tolerance: WNL for tasks assessed on eval  FUNCTIONAL OUTCOME MEASURES: FOTO: 49  UPPER EXTREMITY ROM     Active ROM Right eval Left eval  Shoulder flexion WNL WNL  Shoulder abduction    Shoulder adduction    Shoulder extension    Shoulder internal rotation    Shoulder external rotation    Elbow flexion    Elbow extension -5 WNL  Wrist flexion 38 WNL  Wrist extension 50 WNL  Wrist ulnar deviation    Wrist radial deviation    Wrist pronation    Wrist supination    (Blank  rows = not tested)   UPPER EXTREMITY MMT:     MMT Right eval Left eval  Shoulder flexion 4+/5   Shoulder abduction    Shoulder adduction    Shoulder extension    Shoulder internal rotation    Shoulder external rotation    Middle trapezius    Lower trapezius    Elbow flexion 4+/5   Elbow extension 4+/5   Wrist flexion    Wrist extension    Wrist ulnar deviation    Wrist radial deviation    Wrist pronation    Wrist supination    (Blank rows = not tested)  HAND FUNCTION: Loose gross grasp, unable to squeeze dynamometer  COORDINATION: Box and Blocks:  Right 10 blocks, Left 53 blocks Modified performance with RUE with therapist holding blocks in palm and pt able to complete gross grasp to pick up one at a time from OT palm  of hand, minimal hand opening/closing 04/02/22: Right: 16 blocks  SENSATION: Light touch: WFL Stereognosis: Impaired  Hot/Cold: Impaired  Proprioception: Impaired   COGNITION: Overall cognitive status: Within functional limits for tasks assessed  VISION: Subjective report: Pt reports that her R eye does not close as well and that she feels that her L eye is compensating for her R eye. Baseline vision: Wears glasses all the time  VISION ASSESSMENT: To be further assessed in functional context  --------------------------------------------------------------------------------------------------------------------------------------------------------------------- (objective measures above completed at initial evaluation unless otherwise dated)  TODAY'S TREATMENT:  06/03/22 Coordination: picking up large jacks with R hand with focus on tip pinch.  Pt then placing in matching cones with focus on shoulder movements and functional reach.  Pt then pouring cups to facilitate internal rotation.  Pt demonstrating mild difficulty with radial deviation when grasping cones.   Pegs: OT transitioned to having pt place and remove large grip pegs into pegboard in vertical  plane to facilitate increased radial deviation and wrist flexion.  Pt utilizing gross grasp due to decreased tripod grip and wrist mobility.  OT instructed pt to pick up pegs from table top to address wrist flexion and radial deviation, pt dropping pegs frequently due to decreased sensation, proprioception, and mobility. Wrist extension/radial deviation: engaged in wrist mobility with medium sized ball with focus on wrist extension and radial/ulnar deviation.  Pt completed with focus on sustained hold in each position.    05/30/22 Grasp and pinch: Stacking cones with focus on full grasp and motor control with grasp and release.  OT quickly transitioned pt to picking up large pegs with full grasp and challenging attempts to transition to pinch grip.  Pt requiring increased wrist extension when picking up pegs from upright position and moving to a different hole in peg board.  Attempted precision/tip to tip pinch, however pt with increased difficulty due to impaired sensation with inability to modify or sustain pinch grip. Engaged in BUE tasks such as folding laundry and sorting items to simulate sorting utensils.  Pt requiring increased time and effort when folding pillow case, initially completing with only RUE.  OT educated on use of BUE in conjunction during routine, functional tasks to facilitate increased motor recovery.      05/28/22 Medication management: Opening variety of medication bottles with use of R hand to maintain grasp on pill bottles while opening with L hand.  Pt then pouring pills into L hand with use of R hand and then pt placing pills into slots with L hand.  OT encouraged pt to attempt to place in pill box slots with use of R hand.  Pt dropping 4/21 due to decreased sensation. OT encouraging pt to attempt use of RUE with large pills. Coordination: placing small beads on vertical toothpicks, dropping >50% especially as attempting to lift RUE to place onto vertical target.  Due to  decreased sensation and ability to grade pressure, pt with decreased control and sustained grasp on beads when attempting to place on vertical target.  Downgraded task to picking up small beads and placing them into cup with improved success.   Hand Gripper: with RUE on level 5# with green spring. Pt picked up 1 inch blocks with gripper with zero drops and min difficulty with sustained grasp to place blocks into container.     PATIENT EDUCATION: Education details: ongoing education with focus on coordination and progressive ROM Person educated: Patient Education method: Explanation, Demonstration, and Verbal cues Education comprehension: verbalized understanding and needs further  education   HOME EXERCISE PROGRAM: Fine motor coordination handout  Access Code: [email or web address] URL: [email or web address] Date: 05/28/2022 Prepared by: [facility] Outpatient  Rehab - [facility]  Exercises - Putty Squeezes  - 1 x daily - 1 sets - 10 reps - Rolling Putty on Table  - 1 x daily - 1 sets - 10 reps - Tip Pinch with Putty  - 1 x daily - 1 sets - 10 reps - 3-Point Pinch with Putty  - 1 x daily - 1 sets - 10 reps - Finger Pinch and Pull with Putty  - 1 x daily - 1 sets - 10 reps - Seated Digit Tendon Gliding  - 1 x daily - 1 sets - 10 reps - Seated Wrist Flexion Extension PROM  - 1 x daily - 2 sets - 10 reps - 5-10 sec hold - Thumb MP Flexion Extension  - 1 x daily - 2 sets - 10 reps - 5-10 sec hold      GOALS: Goals reviewed with patient? Yes  SHORT TERM GOALS: Target date: 05/16/22   Pt will be independent with advanced HEP for fine and gross motor control.  Baseline:  Goal status: Met - 05/15/22  2.   Pt will demonstrate improved UE functional use for ADLs as evidenced by increasing box/ blocks score by 4 blocks with RUE.  Baseline: R: 16 blocks and L: 53  Goal status: Met - R: 20 blocks on 05/06/22  3.   Pt will demonstrate and verbalize safe mobility/engagement of  RUE s/p rotator cuff tear during ADLs/IADLs.  Baseline:  Goal status: Met - 05/15/22   LONG TERM GOALS: Target date: 06/06/22  Pt will demonstrate increased hand function and grasp to open containers (jars, medications) and verbalize understanding of AE PRN to increase ease, safety, and independence with opening containers. Baseline:  Goal status: In progress  2.  Pt will demonstrate use of BUE together to engage in functional IADL tasks (simple meal prep, folding laundry, etc). Baseline:  Goal status: In progress  3.  Pt will demonstrate increased functional reach and grasp to obtain or place light weight item from/on moderate height shelf to simulate IADLs. Baseline:  Goal status: In progress  4. Pt will increase RUE grip strength by 5# to increase success with holding and manipulating dishes and pots/pain for cooking.  Baseline:  Goal status: In progress  5.  Pt will be able to complete 9 hole peg test with RUE in 2 min time limit to demonstrate increased fine motor control as needed for ADLs/IADLs.  Baseline: able to place 2 pegs in 2 min time limit  Goal status: In progress   ASSESSMENT:  CLINICAL IMPRESSION: Pt demonstrating good gross motor control and ability to compensate for decreased sensation in precision pinch to utilize more gross grasp.  Pt continues to compensate with gross grasp when attempting to challenge wrist extension and radial deviation. Pt benefiting from targeted focus on medium sized ball and sustained hold in each position.  PERFORMANCE DEFICITS in functional skills including ADLs, IADLs, coordination, dexterity, sensation, ROM, strength, FMC, GMC, balance, body mechanics, decreased knowledge of use of DME, and UE functional use and psychosocial skills including environmental adaptation, habits, and routines and behaviors.   IMPAIRMENTS are limiting patient from ADLs, IADLs, and work.   COMORBIDITIES may have co-morbidities  that affects occupational  performance. Patient will benefit from skilled OT to address above impairments and improve overall function.  MODIFICATION OR ASSISTANCE TO COMPLETE  EVALUATION: Min-Moderate modification of tasks or assist with assess necessary to complete an evaluation.  OT OCCUPATIONAL PROFILE AND HISTORY: Detailed assessment: Review of records and additional review of physical, cognitive, psychosocial history related to current functional performance.  CLINICAL DECISION MAKING: Moderate - several treatment options, min-mod task modification necessary  REHAB POTENTIAL: Good  EVALUATION COMPLEXITY: Moderate    PLAN: OT FREQUENCY: 2x/week  OT DURATION: 6 weeks  PLANNED INTERVENTIONS: self care/ADL training, therapeutic exercise, therapeutic activity, neuromuscular re-education, manual therapy, passive range of motion, balance training, functional mobility training, splinting, electrical stimulation, ultrasound, compression bandaging, moist heat, cryotherapy, patient/family education, psychosocial skills training, energy conservation, coping strategies training, and DME and/or AE instructions  RECOMMENDED OTHER SERVICES: N/A  CONSULTED AND AGREED WITH PLAN OF CARE: Patient and family member/caregiver  PLAN FOR NEXT SESSION: to see ortho PT for shoulder rehab - being followed by Dr. Everardo Pacific  Engage in wrist ROM and pinch and attempt finger isolation exercises/activities. Gross grasp and reach activities.  Hand gripper.  Massage (as appropriate) and tone management.   Rosalio Loud, OTR/L 06/03/2022, 1:38 PM
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
## 2022-06-03 NOTE — Addendum Note (Signed)
Addended by: Garald Balding B on: 06/03/2022 02:52 PM   Modules accepted: Orders
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
## 2022-06-06 ENCOUNTER — Ambulatory Visit: Payer: Federal, State, Local not specified - PPO | Admitting: Occupational Therapy

## 2022-06-06 ENCOUNTER — Ambulatory Visit: Payer: Federal, State, Local not specified - PPO

## 2022-06-06 DIAGNOSIS — R471 Dysarthria and anarthria: Secondary | ICD-10-CM | POA: Diagnosis not present

## 2022-06-06 DIAGNOSIS — I69351 Hemiplegia and hemiparesis following cerebral infarction affecting right dominant side: Secondary | ICD-10-CM | POA: Diagnosis not present

## 2022-06-06 DIAGNOSIS — R482 Apraxia: Secondary | ICD-10-CM | POA: Diagnosis not present

## 2022-06-06 DIAGNOSIS — R278 Other lack of coordination: Secondary | ICD-10-CM | POA: Diagnosis not present

## 2022-06-06 DIAGNOSIS — M6281 Muscle weakness (generalized): Secondary | ICD-10-CM | POA: Diagnosis not present

## 2022-06-06 DIAGNOSIS — R41841 Cognitive communication deficit: Secondary | ICD-10-CM | POA: Diagnosis not present

## 2022-06-06 DIAGNOSIS — R208 Other disturbances of skin sensation: Secondary | ICD-10-CM | POA: Diagnosis not present

## 2022-06-06 DIAGNOSIS — R4701 Aphasia: Secondary | ICD-10-CM | POA: Diagnosis not present

## 2022-06-06 NOTE — Patient Instructions (Signed)
Think about what you want to say before you say it. Ex: practice your order before you order at a restaurant   Give yourself a break in conversation if you're getting tired. Ask a question to give yourself some time to rest and/or breathe   Continue to practice going slowly while talking and reading   If you have a word you have trouble saying, slow down and say it 2-3 times after you get the correct production

## 2022-06-06 NOTE — Therapy (Signed)
OUTPATIENT OCCUPATIONAL THERAPY  Treatment Note  Patient Name: Chloe Johnson MRN: [id number] DOB:[birth year], 67 y.o., female Today's Date: 06/06/2022  PCP: Charlane Ferretti, MD REFERRING PROVIDER: Izora Ribas, MD    OT End of Session - 06/06/22 [phone]     Visit Number 23    Number of Visits 35    Date for OT Re-Evaluation 07/18/22    Authorization Type Medicare A&B    OT Start Time 0934    OT Stop Time 1015    OT Time Calculation (min) 41 min    Activity Tolerance Patient tolerated treatment well    Behavior During Therapy [facility] for tasks assessed/performed                             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/16/2013   L3-4/notes 11/24/2013, arms, neck   Exertional asthma    GERD (gastroesophageal reflux disease)    Heart attack ([place]) 01/18/2022   Hypertension    Kidney stones    "have always passed them"   Neck pain    Osteomyelitis ([place]) 11/23/2013   osteomyelitis, discitis    Sleep concern    uses Prozac for sleep    Stroke ([place]) 01/21/2022   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; left with aphasia, dysarthria, and apraxia   Past Surgical History:  Procedure Laterality Date   BREAST CYST EXCISION Left 2010   "polypectomy"   IR CT HEAD LTD  01/21/2022   IR CT HEAD LTD  01/28/2022   IR PERCUTANEOUS ART THROMBECTOMY/INFUSION INTRACRANIAL INC DIAG ANGIO  01/21/2022   IR PERCUTANEOUS ART THROMBECTOMY/INFUSION INTRACRANIAL INC DIAG ANGIO  01/28/2022   IR US GUIDE VASC ACCESS RIGHT  01/22/2022   PICC LINE PLACE PERIPHERAL ([place] HX) Right    for use of Levaquin & Vancomycin, of note: she reports that she had a "flulike feeling"    RADIOLOGY WITH ANESTHESIA N/A 01/21/2022   Procedure: IR WITH ANESTHESIA;  Surgeon: Luanne Bras, MD;  Location: [place];  Service: Radiology;  Laterality: N/A;   RADIOLOGY WITH ANESTHESIA N/A 01/28/2022   Procedure: IR WITH ANESTHESIA;  Surgeon: Radiologist,  Medication, MD;  Location: [place];  Service: Radiology;  Laterality: N/A;   [place]   Patient Active Problem List   Diagnosis Date Noted   Aphasia 03/31/2022   Snores 03/31/2022   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   UTI (urinary tract infection) 02/04/2022   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   Stroke determined by clinical assessment ([place]) 01/28/2022   Middle cerebral artery embolism, left 01/28/2022   H/O ischemic left MCA stroke    Acute respiratory failure (HCC)    CVA (cerebral vascular accident) ([place]) 01/21/2022   NSTEMI (non-ST elevated myocardial infarction) ([place]) 01/21/2022   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   HFrEF (heart failure with reduced ejection fraction) ([place]) 01/21/2022   Hyperlipidemia 01/21/2022   Lumbar stenosis 01/21/2022   Stroke (cerebrum) ([place]) 01/21/2022   S/P lumbar spinal fusion 11/15/2014   Lumbar discitis 11/29/2013   Discitis 11/24/2013   Diarrhea 11/17/2013   Osteomyelitis of lumbar spine ([place]) 11/16/2013   Hypertension    Asthma    Anxiety     ONSET DATE: 01/28/22  REFERRING DIAG: I63.9 (ICD-10-CM) - Cerebral infarction, unspecified   THERAPY DIAG:  Hemiplegia  and hemiparesis following cerebral infarction affecting right dominant side (HCC)  Muscle weakness (generalized)  Other lack of coordination  Other disturbances of skin sensation  Rationale for Evaluation and Treatment Rehabilitation  SUBJECTIVE:   SUBJECTIVE STATEMENT: Pt reports that she plans to take down her Christmas decorations this weekend. Pt accompanied by: self  PERTINENT HISTORY: PMH: HTN, cervical radiculopathy, hepatitis, osteomyelitis/discitis, lumbar stenosis with foot drop anxiety, GERD, insomnia  PRECAUTIONS: Fall  WEIGHT BEARING RESTRICTIONS  was awaiting spinal fusion surgery, limit bending, twisting, lifting >10#  PAIN:  Are you having pain? Yes: NPRS scale:  2/10 Pain location: R shoulder Pain description: aching Aggravating factors: PT appt this morning Relieving factors: massage  FALLS: Has patient fallen in last 6 months? Yes. Number of falls 2  LIVING ENVIRONMENT: Lives with: lives with their spouse Lives in: House/apartment Stairs: Yes: Internal: full flights of steps; on right going up and External: 7 steps; on left going up Has following equipment at home: Single point cane, Walker - 2 wheeled, Walker - 4 wheeled, shower chair, and bed side commode  PLOF: Independent  PATIENT GOALS I want my arm back.    OBJECTIVE:   HAND DOMINANCE: Right  ADLs: Transfers/ambulation related to ADLs: Mod I with no use of  Eating: Husband assists with cutting, feeding self with L hand Grooming: utilizing L hand with opening and brushing teeth UB Dressing: unable to manage buttons, otherwise Mod I LB Dressing: unable to tie shoes, just slipping on toes Toileting: Mod I with use of L hand Bathing: Mod I, utilizing L hand to shave Tub Shower transfers: Mod I Equipment: Shower seat with back, Walk in shower, and bed side commode   IADLs: Light housekeeping: laundry and hanging up clothes, wipes counters/sink, needs assist to fold clothing Meal Prep: no, did not cook pre-stroke Medication management: husband opens pill bottles Handwriting: unable  MOBILITY STATUS: Needs Assist: Supervision - Mod I without use of AD  POSTURE COMMENTS:  No Significant postural limitations  ACTIVITY TOLERANCE: Activity tolerance: WNL for tasks assessed on eval  FUNCTIONAL OUTCOME MEASURES: FOTO: 49  UPPER EXTREMITY ROM     Active ROM Right eval Left eval  Shoulder flexion WNL WNL  Shoulder abduction    Shoulder adduction    Shoulder extension    Shoulder internal rotation    Shoulder external rotation    Elbow flexion    Elbow extension -5 WNL  Wrist flexion 38 WNL  Wrist extension 50 WNL  Wrist ulnar deviation    Wrist radial deviation     Wrist pronation    Wrist supination    (Blank rows = not tested)   UPPER EXTREMITY MMT:     MMT Right eval Left eval  Shoulder flexion 4+/5   Shoulder abduction    Shoulder adduction    Shoulder extension    Shoulder internal rotation    Shoulder external rotation    Middle trapezius    Lower trapezius    Elbow flexion 4+/5   Elbow extension 4+/5   Wrist flexion    Wrist extension    Wrist ulnar deviation    Wrist radial deviation    Wrist pronation    Wrist supination    (Blank rows = not tested)  HAND FUNCTION: Loose gross grasp, unable to squeeze dynamometer 06/06/22: R: 15#  COORDINATION: Box and Blocks:  Right 10 blocks, Left 53 blocks Modified performance with RUE with therapist holding blocks in palm and pt able to complete gross grasp  to pick up one at a time from OT palm of hand, minimal hand opening/closing 04/02/22: Right: 16 blocks  SENSATION: Light touch: WFL Stereognosis: Impaired  Hot/Cold: Impaired  Proprioception: Impaired   COGNITION: Overall cognitive status: Within functional limits for tasks assessed  VISION: Subjective report: Pt reports that her R eye does not close as well and that she feels that her L eye is compensating for her R eye. Baseline vision: Wears glasses all the time  VISION ASSESSMENT: To be further assessed in functional context  --------------------------------------------------------------------------------------------------------------------------------------------------------------------- (objective measures above completed at initial evaluation unless otherwise dated)  TODAY'S TREATMENT:  06/06/22 9 hole peg test: Pt able to place 5 pegs in 2 min time limit with use of gross grasp when picking up pegs from container.  Pt utilizing side of container to slide pegs into hand and then placing pegs into peg holes from ulnar side of hand. Pt dropping pegs frequently due to decreased coordination, motor control, and  sensation.   Meal prep: pt reports that she is baking, but does notice that she has more difficulty when picking up casserole dishes, stirring items, and does not trust herself to complete any cutting of items.  Pt states that she tries to use her R arm as much as possible, but gets frustrated. FOTO: 64. Pt continues to report decreased ability to pick up coins from table top, brush/comb hair, and placing items in cabinet at shoulder height. Grip: engaged in resistive task with velcro balls to challenge wrist extension and sustained pinch to remove balls from surface.  Pt demonstrating good 3 jaw chuck when picking up balls and removing.  Increased challenge to in-hand manipulation, however pt still with decreased thumb movement to manipulate items.    06/03/22 Coordination: picking up large jacks with R hand with focus on tip pinch.  Pt then placing in matching cones with focus on shoulder movements and functional reach.  Pt then pouring cups to facilitate internal rotation.  Pt demonstrating mild difficulty with radial deviation when grasping cones.   Pegs: OT transitioned to having pt place and remove large grip pegs into pegboard in vertical plane to facilitate increased radial deviation and wrist flexion.  Pt utilizing gross grasp due to decreased tripod grip and wrist mobility.  OT instructed pt to pick up pegs from table top to address wrist flexion and radial deviation, pt dropping pegs frequently due to decreased sensation, proprioception, and mobility. Wrist extension/radial deviation: engaged in wrist mobility with medium sized ball with focus on wrist extension and radial/ulnar deviation.  Pt completed with focus on sustained hold in each position.    05/30/22 Grasp and pinch: Stacking cones with focus on full grasp and motor control with grasp and release.  OT quickly transitioned pt to picking up large pegs with full grasp and challenging attempts to transition to pinch grip.  Pt requiring  increased wrist extension when picking up pegs from upright position and moving to a different hole in peg board.  Attempted precision/tip to tip pinch, however pt with increased difficulty due to impaired sensation with inability to modify or sustain pinch grip. Engaged in [place] tasks such as folding laundry and sorting items to simulate sorting utensils.  Pt requiring increased time and effort when folding pillow case, initially completing with only RUE.  OT educated on use of BUE in conjunction during routine, functional tasks to facilitate increased motor recovery.      PATIENT EDUCATION: Education details: ongoing education with focus on coordination  and progressive ROM Person educated: Patient Education method: Explanation, Demonstration, and Verbal cues Education comprehension: verbalized understanding and needs further education   HOME EXERCISE PROGRAM: Fine motor coordination handout  Access Code: [email or web address] URL: [email or web address] Date: 05/28/2022 Prepared by: [facility] Outpatient  Rehab - [facility]  Exercises - Putty Squeezes  - 1 x daily - 1 sets - 10 reps - Rolling Putty on Table  - 1 x daily - 1 sets - 10 reps - Tip Pinch with Putty  - 1 x daily - 1 sets - 10 reps - 3-Point Pinch with Putty  - 1 x daily - 1 sets - 10 reps - Finger Pinch and Pull with Putty  - 1 x daily - 1 sets - 10 reps - Seated Digit Tendon Gliding  - 1 x daily - 1 sets - 10 reps - Seated Wrist Flexion Extension PROM  - 1 x daily - 2 sets - 10 reps - 5-10 sec hold - Thumb MP Flexion Extension  - 1 x daily - 2 sets - 10 reps - 5-10 sec hold      GOALS: Goals reviewed with patient? Yes  SHORT TERM GOALS: Target date: 05/16/22    Pt will be independent with advanced HEP for fine and gross motor control.             Baseline:             Goal status: Met - 05/15/22   2.   Pt will demonstrate improved UE functional use for ADLs as evidenced by increasing box/ blocks score by  4 blocks with RUE.             Baseline: R: 16 blocks and L: 53             Goal status: Met - R: 20 blocks on 05/06/22   3.   Pt will demonstrate and verbalize safe mobility/engagement of RUE s/p rotator cuff tear during ADLs/IADLs.             Baseline:             Goal status: Met - 05/15/22     LONG TERM GOALS: Target date: 06/06/22   Pt will demonstrate increased hand function and grasp to open containers (jars, medications) and verbalize understanding of AE PRN to increase ease, safety, and independence with opening containers. Baseline:  Goal status: partially met 06/06/22   2.  Pt will demonstrate use of BUE together to engage in functional IADL tasks (simple meal prep, folding laundry, etc). Baseline:  Goal status: In progress   3.  Pt will demonstrate increased functional reach and grasp to obtain or place light weight item from/on moderate height shelf to simulate IADLs. Baseline:  Goal status: In progress   4. Pt will increase RUE grip strength by 5# to increase success with holding and manipulating dishes and pots/pain for cooking.             Baseline:             Goal status: met - 15# on 06/06/22   5.  Pt will be able to complete 9 hole peg test with RUE in 2 min time limit to demonstrate increased fine motor control as needed for ADLs/IADLs.             Baseline: able to place 2 pegs in 2 min time limit             Goal status:  In progress - 5 pegs in 2 mins with gross grasp  NEW SHORT TERM GOALS: Target date: 06/27/22   Pt will be independent with advanced HEP for fine and gross motor control.  Baseline:  Goal status: INITIAL  2.   Pt will demonstrate improved UE functional use for ADLs as evidenced by increasing box/ blocks score by 4 blocks with RUE.  Baseline: R: 20 blocks and L: 53  Goal status: INITIAL  3.   Pt will demonstrate improved wrist mobility to allow for increased ease and independence with combing/brushing hair.  Baseline:  Goal status:  INITIAL   NEW LONG TERM GOALS: Target date: 07/18/22  Pt will demonstrate increased hand function and grasp to open personal medication bottles (including child proof) and dispense meds with <2 drops of medication. Baseline:  Goal status: INITIAL  2.  Pt will demonstrate use of BUE together to engage in functional IADL tasks (simple meal prep, folding laundry, etc). Baseline:  Goal status: INITIAL  3.  Pt will demonstrate increased functional reach and grasp to obtain or place light weight item from/on moderate height shelf to simulate IADLs. Baseline:  Goal status: INITIAL  4. Pt will increase RUE grip strength by 5# to increase success with holding and manipulating dishes and pots/pain for cooking.  Baseline: 15#  Goal status: INITIAL  5.  Pt will be able to complete 9 hole peg test with RUE in 2 min time limit to demonstrate increased fine motor control as needed for ADLs/IADLs.  Baseline:   Goal status: INITIAL  6.  Pt will demonstrate spasticity management strategies.  Baseline:  Goal status: INITIAL   ASSESSMENT:  CLINICAL IMPRESSION: Pt continues to demonstrate decreased precision pinch and coordination, compensating with gross grasp.  Pt benefiting from targeted focus on pinch and grip against resistive velcro.  Pt will continue to benefit from OT services with focus on functional use of dominant RUE with grooming tasks, sustained grasp to open containers and place items on shelf, and overall coordination and grip strengthening as needed for ADLs and IADLs.  PERFORMANCE DEFICITS in functional skills including ADLs, IADLs, coordination, dexterity, sensation, ROM, strength, FMC, GMC, balance, body mechanics, decreased knowledge of use of DME, and UE functional use and psychosocial skills including environmental adaptation, habits, and routines and behaviors.   IMPAIRMENTS are limiting patient from ADLs, IADLs, and work.   COMORBIDITIES may have co-morbidities  that affects  occupational performance. Patient will benefit from skilled OT to address above impairments and improve overall function.  MODIFICATION OR ASSISTANCE TO COMPLETE EVALUATION: Min-Moderate modification of tasks or assist with assess necessary to complete an evaluation.  OT OCCUPATIONAL PROFILE AND HISTORY: Detailed assessment: Review of records and additional review of physical, cognitive, psychosocial history related to current functional performance.  CLINICAL DECISION MAKING: Moderate - several treatment options, min-mod task modification necessary  REHAB POTENTIAL: Good  EVALUATION COMPLEXITY: Moderate    PLAN: OT FREQUENCY: 2x/week  OT DURATION: 6 weeks  PLANNED INTERVENTIONS: self care/ADL training, therapeutic exercise, therapeutic activity, neuromuscular re-education, manual therapy, passive range of motion, balance training, functional mobility training, splinting, electrical stimulation, ultrasound, compression bandaging, moist heat, cryotherapy, patient/family education, psychosocial skills training, energy conservation, coping strategies training, and DME and/or AE instructions  RECOMMENDED OTHER SERVICES: N/A  CONSULTED AND AGREED WITH PLAN OF CARE: Patient and family member/caregiver  PLAN FOR NEXT SESSION: to see ortho PT for shoulder rehab - being followed by Dr. Everardo Pacific  Engage in wrist ROM and pinch and attempt  finger isolation exercises/activities. Gross grasp and reach activities.  Hand gripper.  Massage (as appropriate) and tone management.   Rosalio Loud, OTR/L 06/06/2022, 12:18 PM

## 2022-06-09 ENCOUNTER — Ambulatory Visit: Payer: Federal, State, Local not specified - PPO

## 2022-06-09 DIAGNOSIS — R278 Other lack of coordination: Secondary | ICD-10-CM | POA: Diagnosis not present

## 2022-06-09 DIAGNOSIS — R482 Apraxia: Secondary | ICD-10-CM | POA: Diagnosis not present

## 2022-06-09 DIAGNOSIS — I69351 Hemiplegia and hemiparesis following cerebral infarction affecting right dominant side: Secondary | ICD-10-CM | POA: Diagnosis not present

## 2022-06-09 DIAGNOSIS — R4701 Aphasia: Secondary | ICD-10-CM | POA: Diagnosis not present

## 2022-06-09 DIAGNOSIS — R208 Other disturbances of skin sensation: Secondary | ICD-10-CM | POA: Diagnosis not present

## 2022-06-09 DIAGNOSIS — R41841 Cognitive communication deficit: Secondary | ICD-10-CM

## 2022-06-09 DIAGNOSIS — M6281 Muscle weakness (generalized): Secondary | ICD-10-CM | POA: Diagnosis not present

## 2022-06-09 DIAGNOSIS — R471 Dysarthria and anarthria: Secondary | ICD-10-CM | POA: Diagnosis not present
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
## 2022-06-10 DIAGNOSIS — M6281 Muscle weakness (generalized): Secondary | ICD-10-CM | POA: Diagnosis not present

## 2022-06-10 DIAGNOSIS — M75111 Incomplete rotator cuff tear or rupture of right shoulder, not specified as traumatic: Secondary | ICD-10-CM | POA: Diagnosis not present

## 2022-06-11 ENCOUNTER — Ambulatory Visit: Payer: Federal, State, Local not specified - PPO | Admitting: Occupational Therapy

## 2022-06-11 ENCOUNTER — Ambulatory Visit: Payer: Federal, State, Local not specified - PPO

## 2022-06-11 DIAGNOSIS — I69351 Hemiplegia and hemiparesis following cerebral infarction affecting right dominant side: Secondary | ICD-10-CM | POA: Diagnosis not present

## 2022-06-11 DIAGNOSIS — M6281 Muscle weakness (generalized): Secondary | ICD-10-CM | POA: Diagnosis not present

## 2022-06-11 DIAGNOSIS — R278 Other lack of coordination: Secondary | ICD-10-CM | POA: Diagnosis not present

## 2022-06-11 DIAGNOSIS — R208 Other disturbances of skin sensation: Secondary | ICD-10-CM | POA: Diagnosis not present

## 2022-06-11 DIAGNOSIS — R482 Apraxia: Secondary | ICD-10-CM | POA: Diagnosis not present

## 2022-06-11 DIAGNOSIS — R471 Dysarthria and anarthria: Secondary | ICD-10-CM

## 2022-06-11 DIAGNOSIS — R4701 Aphasia: Secondary | ICD-10-CM | POA: Diagnosis not present

## 2022-06-11 DIAGNOSIS — R41841 Cognitive communication deficit: Secondary | ICD-10-CM | POA: Diagnosis not present
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
## 2022-06-11 NOTE — Therapy (Signed)
OUTPATIENT OCCUPATIONAL THERAPY  Treatment Note  Patient Name: Chloe Johnson MRN: [id number] DOB:24-Apr-1956, 67 y.o., female Today's Date: 06/11/2022  PCP: Charlane Ferretti, MD REFERRING PROVIDER: Izora Ribas, MD    OT End of Session - 06/11/22 [phone]     Visit Number 24    Number of Visits 35    Date for OT Re-Evaluation 07/18/22    Authorization Type Medicare A&B    OT Start Time 0933    OT Stop Time 3149    OT Time Calculation (min) 42 min    Activity Tolerance Patient tolerated treatment well    Behavior During Therapy 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; left with aphasia, dysarthria, and apraxia   Past Surgical History:  Procedure Laterality Date   BREAST CYST EXCISION Left 2010   "polypectomy"   IR CT HEAD LTD  01/21/2022   IR CT HEAD LTD  01/28/2022   IR PERCUTANEOUS ART THROMBECTOMY/INFUSION INTRACRANIAL INC DIAG ANGIO  01/21/2022   IR PERCUTANEOUS ART THROMBECTOMY/INFUSION INTRACRANIAL INC DIAG ANGIO  01/28/2022   IR US GUIDE VASC ACCESS RIGHT  01/22/2022   PICC LINE PLACE PERIPHERAL ([place] HX) Right    for use of Levaquin & Vancomycin, of note: she reports that she had a "flulike feeling"    RADIOLOGY WITH ANESTHESIA N/A 01/21/2022   Procedure: IR WITH ANESTHESIA;  Surgeon: Luanne Bras, MD;  Location: [place];  Service: Radiology;  Laterality: N/A;   RADIOLOGY WITH ANESTHESIA N/A 01/28/2022   Procedure: IR WITH ANESTHESIA;  Surgeon: Radiologist,  Medication, MD;  Location: [place];  Service: Radiology;  Laterality: N/A;   [place]   Patient Active Problem List   Diagnosis Date Noted   Aphasia 03/31/2022   Snores 03/31/2022   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   Stroke determined by clinical assessment ([place]) 01/28/2022   Middle cerebral artery embolism, left 01/28/2022   H/O ischemic left MCA stroke    Acute respiratory failure (HCC)    CVA (cerebral vascular accident) ([place]) 01/21/2022   NSTEMI (non-ST elevated myocardial infarction) ([place]) 01/21/2022   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   HFrEF (heart failure with reduced ejection fraction) ([place]) 01/21/2022   Hyperlipidemia 01/21/2022   Lumbar stenosis 01/21/2022   Stroke (cerebrum) ([place]) 01/21/2022   S/P lumbar spinal fusion 11/15/2014   Lumbar discitis 11/29/2013   Discitis 11/24/2013   Diarrhea 11/17/2013   Osteomyelitis of lumbar spine ([place]) 11/16/2013   Hypertension    Asthma    Anxiety     ONSET DATE: 01/28/22  REFERRING DIAG: I63.9 (ICD-10-CM) - Cerebral infarction, unspecified   THERAPY DIAG:  Hemiplegia and hemiparesis following cerebral infarction affecting right dominant side (HCC)  Muscle weakness (generalized)  Other lack of coordination  Other disturbances of skin sensation  Rationale for Evaluation and Treatment Rehabilitation  SUBJECTIVE:   SUBJECTIVE STATEMENT: "Is it cold enough out there?" Pt accompanied by: self  PERTINENT HISTORY: PMH: HTN, cervical radiculopathy, hepatitis, osteomyelitis/discitis, lumbar stenosis with foot drop anxiety, GERD, insomnia  PRECAUTIONS: Fall  WEIGHT BEARING RESTRICTIONS  was awaiting spinal fusion surgery, limit bending, twisting, lifting >10#  PAIN:  Are you having pain? Yes: NPRS scale: 2/10 Pain location: R shoulder Pain description:  aching Aggravating factors: PT appt this morning Relieving factors: massage  FALLS: Has patient fallen in last 6 months? Yes. Number of falls 2  LIVING ENVIRONMENT: Lives with: lives with their spouse Lives in: House/apartment Stairs: Yes: Internal: full flights of steps; on right going up and External: 7 steps; on left going up Has following equipment at home: Single point cane, Walker - 2 wheeled, Walker - 4 wheeled, shower chair, and bed side commode  PLOF: Independent  PATIENT GOALS I want my arm back.    OBJECTIVE:   HAND DOMINANCE: Right  ADLs: Transfers/ambulation related to ADLs: Mod I with no use of  Eating: Husband assists with cutting, feeding self with L hand Grooming: utilizing L hand with opening and brushing teeth UB Dressing: unable to manage buttons, otherwise Mod I LB Dressing: unable to tie shoes, just slipping on toes Toileting: Mod I with use of L hand Bathing: Mod I, utilizing L hand to shave Tub Shower transfers: Mod I Equipment: Shower seat with back, Walk in shower, and bed side commode   IADLs: Light housekeeping: laundry and hanging up clothes, wipes counters/sink, needs assist to fold clothing Meal Prep: no, did not cook pre-stroke Medication management: husband opens pill bottles Handwriting: unable  MOBILITY STATUS: Needs Assist: Supervision - Mod I without use of AD  POSTURE COMMENTS:  No Significant postural limitations  ACTIVITY TOLERANCE: Activity tolerance: WNL for tasks assessed on eval  FUNCTIONAL OUTCOME MEASURES: FOTO: 49  UPPER EXTREMITY ROM     Active ROM Right eval Left eval  Shoulder flexion WNL WNL  Shoulder abduction    Shoulder adduction    Shoulder extension    Shoulder internal rotation    Shoulder external rotation    Elbow flexion    Elbow extension -5 WNL  Wrist flexion 38 WNL  Wrist extension 50 WNL  Wrist ulnar deviation    Wrist radial deviation    Wrist pronation    Wrist supination    (Blank  rows = not tested)   UPPER EXTREMITY MMT:     MMT Right eval Left eval  Shoulder flexion 4+/5   Shoulder abduction    Shoulder adduction    Shoulder extension    Shoulder internal rotation    Shoulder external rotation    Middle trapezius    Lower trapezius    Elbow flexion 4+/5   Elbow extension 4+/5   Wrist flexion    Wrist extension    Wrist ulnar deviation    Wrist radial deviation    Wrist pronation    Wrist supination    (Blank rows = not tested)  HAND FUNCTION: Loose gross grasp, unable to squeeze dynamometer 06/06/22: R: 15#  COORDINATION: Box and Blocks:  Right 10 blocks, Left 53 blocks Modified performance with RUE with therapist holding blocks in palm and pt able to complete gross grasp to pick up one at a  time from OT palm of hand, minimal hand opening/closing 04/02/22: Right: 16 blocks  SENSATION: Light touch: WFL Stereognosis: Impaired  Hot/Cold: Impaired  Proprioception: Impaired   COGNITION: Overall cognitive status: Within functional limits for tasks assessed  VISION: Subjective report: Pt reports that her R eye does not close as well and that she feels that her L eye is compensating for her R eye. Baseline vision: Wears glasses all the time  VISION ASSESSMENT: To be further assessed in functional context  --------------------------------------------------------------------------------------------------------------------------------------------------------------------- (objective measures above completed at initial evaluation unless otherwise dated)  TODAY'S TREATMENT:  06/11/22 Resistance Clothespins: 1-2# with RUE for low and mid functional reaching and sustained pinch. Pt required min increased effort initially, however with repetition demonstrating improved grip strength.  OT directed pt in lateral pinch as well as 3 jaw chuck pinch.  Increased challenge to incorporating shoulder external rotation and forearm supination to move clothespins from  horizontal dowel to vertical dowel to incorporate functional reach.  OT further increased challenge to adding 4# resistance clothespin. Pt able to transfer clothespins from one horizontal dowel to the other as well as to vertical dowel.  Pt self-selecting rest break with horizontal reach due to fatigue in upper arm/shoulder area.   Stacking cups: engaged in stacking cups into pyramid shape with focus on increased shoulder flexion during functional/sustained grasp.  Pt placing hand on top of cups due to decreased thumb extension with decreased web space.  OT directed pt to attempt picking up cup from side of cups to focus on increased thumb extension.  Pt able to complete with unstacking cups and stacking in one straight stack with improved grasp pattern.  OT challenged pt attempt pyramid stack with full grasp on cups.  Pt able to complete with 4 drops. Coordination: attempted picking up checker pieces with R hand with focus on use of finger tips.  Pt unable to complete in this manner, but was able to complete with use of gross grasp which is still improved from prior sessions.   Thumb PROM: OT instructed in thumb extension stretch as well as purposeful opening between each step of thumb opposition for additional stretch.  Pt completed both, demonstrating good technique.    06/06/22 9 hole peg test: Pt able to place 5 pegs in 2 min time limit with use of gross grasp when picking up pegs from container.  Pt utilizing side of container to slide pegs into hand and then placing pegs into peg holes from ulnar side of hand. Pt dropping pegs frequently due to decreased coordination, motor control, and sensation.   Meal prep: pt reports that she is baking, but does notice that she has more difficulty when picking up casserole dishes, stirring items, and does not trust herself to complete any cutting of items.  Pt states that she tries to use her R arm as much as possible, but gets frustrated. FOTO: 50. Pt continues  to report decreased ability to pick up coins from table top, brush/comb hair, and placing items in cabinet at shoulder height. Grip: engaged in resistive task with velcro balls to challenge wrist extension and sustained pinch to remove balls from surface.  Pt demonstrating good 3 jaw chuck when picking up balls and removing.  Increased challenge to in-hand manipulation, however pt still with decreased thumb movement to manipulate items.    06/03/22 Coordination: picking up large jacks with R hand with focus on tip pinch.  Pt then placing in matching cones with focus on shoulder movements and  functional reach.  Pt then pouring cups to facilitate internal rotation.  Pt demonstrating mild difficulty with radial deviation when grasping cones.   Pegs: OT transitioned to having pt place and remove large grip pegs into pegboard in vertical plane to facilitate increased radial deviation and wrist flexion.  Pt utilizing gross grasp due to decreased tripod grip and wrist mobility.  OT instructed pt to pick up pegs from table top to address wrist flexion and radial deviation, pt dropping pegs frequently due to decreased sensation, proprioception, and mobility. Wrist extension/radial deviation: engaged in wrist mobility with medium sized ball with focus on wrist extension and radial/ulnar deviation.  Pt completed with focus on sustained hold in each position.   PATIENT EDUCATION: Education details: ongoing education with focus on coordination and progressive ROM Person educated: Patient Education method: Explanation, Demonstration, and Verbal cues Education comprehension: verbalized understanding and needs further education   HOME EXERCISE PROGRAM: Fine motor coordination handout  Access Code: [email or web address] URL: [email or web address] Date: 05/28/2022 Prepared by: [facility] - Outpatient  Rehab - [facility]  Exercises - Putty Squeezes  - 1 x daily - 1 sets - 10 reps - Rolling Putty on  Table  - 1 x daily - 1 sets - 10 reps - Tip Pinch with Putty  - 1 x daily - 1 sets - 10 reps - 3-Point Pinch with Putty  - 1 x daily - 1 sets - 10 reps - Finger Pinch and Pull with Putty  - 1 x daily - 1 sets - 10 reps - Seated Digit Tendon Gliding  - 1 x daily - 1 sets - 10 reps - Seated Wrist Flexion Extension PROM  - 1 x daily - 2 sets - 10 reps - 5-10 sec hold - Thumb MP Flexion Extension  - 1 x daily - 2 sets - 10 reps - 5-10 sec hold      GOALS: Goals reviewed with patient? Yes  SHORT TERM GOALS: Target date: 06/27/22   Pt will be independent with advanced HEP for fine and gross motor control.  Baseline:  Goal status: IN PROGRESS  2.   Pt will demonstrate improved UE functional use for ADLs as evidenced by increasing box/ blocks score by 4 blocks with RUE.  Baseline: R: 20 blocks and L: 53  Goal status: IN PROGRESS  3.   Pt will demonstrate improved wrist mobility to allow for increased ease and independence with combing/brushing hair.  Baseline:  Goal status: IN PROGRESS   LONG TERM GOALS: Target date: 07/18/22  Pt will demonstrate increased hand function and grasp to open personal medication bottles (including child proof) and dispense meds with <2 drops of medication. Baseline:  Goal status: IN PROGRESS  2.  Pt will demonstrate use of BUE together to engage in functional IADL tasks (simple meal prep, folding laundry, etc). Baseline:  Goal status: IN PROGRESS  3.  Pt will demonstrate increased functional reach and grasp to obtain or place light weight item from/on moderate height shelf to simulate IADLs. Baseline:  Goal status: IN PROGRESS  4. Pt will increase RUE grip strength by 5# to increase success with holding and manipulating dishes and pots/pain for cooking.  Baseline: 15#  Goal status: IN PROGRESS  5.  Pt will be able to complete 9 hole peg test with RUE in 2 min time limit to demonstrate increased fine motor control as needed for ADLs/IADLs.  Baseline:    Goal status: IN PROGRESS  6.  Pt will demonstrate  spasticity management strategies.  Baseline:  Goal status: IN PROGRESS   ASSESSMENT:  CLINICAL IMPRESSION: Pt reporting mild fatigue and pain in upper arm after resistance clothespins due to incorporating increased mid range reach.  OT educated on functional carryover of exercises to increase ability to place items in cabinets and brush hair.  Pt demonstrating decreased grasp on cups due to limitations in thumb mobility, therefore educated on and completed PROM and AAROM for thumb extension.  PERFORMANCE DEFICITS in functional skills including ADLs, IADLs, coordination, dexterity, sensation, ROM, strength, FMC, GMC, balance, body mechanics, decreased knowledge of use of DME, and UE functional use and psychosocial skills including environmental adaptation, habits, and routines and behaviors.   IMPAIRMENTS are limiting patient from ADLs, IADLs, and work.   COMORBIDITIES may have co-morbidities  that affects occupational performance. Patient will benefit from skilled OT to address above impairments and improve overall function.  MODIFICATION OR ASSISTANCE TO COMPLETE EVALUATION: Min-Moderate modification of tasks or assist with assess necessary to complete an evaluation.  OT OCCUPATIONAL PROFILE AND HISTORY: Detailed assessment: Review of records and additional review of physical, cognitive, psychosocial history related to current functional performance.  CLINICAL DECISION MAKING: Moderate - several treatment options, min-mod task modification necessary  REHAB POTENTIAL: Good  EVALUATION COMPLEXITY: Moderate    PLAN: OT FREQUENCY: 2x/week  OT DURATION: 6 weeks  PLANNED INTERVENTIONS: self care/ADL training, therapeutic exercise, therapeutic activity, neuromuscular re-education, manual therapy, passive range of motion, balance training, functional mobility training, splinting, electrical stimulation, ultrasound, compression  bandaging, moist heat, cryotherapy, patient/family education, psychosocial skills training, energy conservation, coping strategies training, and DME and/or AE instructions  RECOMMENDED OTHER SERVICES: N/A  CONSULTED AND AGREED WITH PLAN OF CARE: Patient and family member/caregiver  PLAN FOR NEXT SESSION: to see ortho PT for shoulder rehab - being followed by Dr. Griffin Basil  Engage in wrist ROM. Gross grasp and reach activities.  Massage (as appropriate) and tone management.   Simonne Come, OTR/L 06/11/2022, 9:39 AM

## 2022-06-12 ENCOUNTER — Ambulatory Visit: Payer: Medicare Other | Attending: Cardiovascular Disease | Admitting: *Deleted

## 2022-06-12 DIAGNOSIS — I513 Intracardiac thrombosis, not elsewhere classified: Secondary | ICD-10-CM | POA: Diagnosis not present

## 2022-06-12 DIAGNOSIS — I639 Cerebral infarction, unspecified: Secondary | ICD-10-CM | POA: Diagnosis not present

## 2022-06-12 LAB — POCT INR: INR: 2 (ref 2.0–3.0)

## 2022-06-12 NOTE — Patient Instructions (Signed)
Description   Today take 1.5 tablets of warfarin then START taking warfarin 1 tablet daily except 1.5 tablets on Mondays. Continue green leafy veggie to weekly. Recheck INR in 2 weeks.  Antico[facility] Clinic [phone]

## 2022-06-13 ENCOUNTER — Ambulatory Visit: Payer: Federal, State, Local not specified - PPO | Admitting: Occupational Therapy

## 2022-06-13 DIAGNOSIS — R41841 Cognitive communication deficit: Secondary | ICD-10-CM | POA: Diagnosis not present

## 2022-06-13 DIAGNOSIS — I69351 Hemiplegia and hemiparesis following cerebral infarction affecting right dominant side: Secondary | ICD-10-CM | POA: Diagnosis not present

## 2022-06-13 DIAGNOSIS — R278 Other lack of coordination: Secondary | ICD-10-CM | POA: Diagnosis not present

## 2022-06-13 DIAGNOSIS — R482 Apraxia: Secondary | ICD-10-CM | POA: Diagnosis not present

## 2022-06-13 DIAGNOSIS — R208 Other disturbances of skin sensation: Secondary | ICD-10-CM | POA: Diagnosis not present

## 2022-06-13 DIAGNOSIS — M6281 Muscle weakness (generalized): Secondary | ICD-10-CM | POA: Diagnosis not present

## 2022-06-13 DIAGNOSIS — R471 Dysarthria and anarthria: Secondary | ICD-10-CM | POA: Diagnosis not present

## 2022-06-13 DIAGNOSIS — R4701 Aphasia: Secondary | ICD-10-CM | POA: Diagnosis not present

## 2022-06-13 NOTE — Therapy (Signed)
OUTPATIENT OCCUPATIONAL THERAPY  Treatment Note  Patient Name: Chloe Johnson MRN: [id number] DOB:Dec 18, 1955, 67 y.o., female Today's Date: 06/13/2022  PCP: Charlane Ferretti, MD REFERRING PROVIDER: Izora Ribas, MD    OT End of Session - 06/13/22 [phone]     Visit Number 25    Number of Visits 35    Date for OT Re-Evaluation 07/18/22    Authorization Type Medicare A&B    OT Start Time 0848    OT Stop Time 0930    OT Time Calculation (min) 42 min    Activity Tolerance Patient tolerated treatment well    Behavior During Therapy [facility] for tasks assessed/performed                               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/16/2013   L3-4/notes 11/24/2013, arms, neck   Exertional asthma    GERD (gastroesophageal reflux disease)    Heart attack ([place]) 01/18/2022   Hypertension    Kidney stones    "have always passed them"   Neck pain    Osteomyelitis ([place]) 11/23/2013   osteomyelitis, discitis    Sleep concern    uses Prozac for sleep    Stroke ([place]) 01/21/2022   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; left with aphasia, dysarthria, and apraxia   Past Surgical History:  Procedure Laterality Date   BREAST CYST EXCISION Left 2010   "polypectomy"   IR CT HEAD LTD  01/21/2022   IR CT HEAD LTD  01/28/2022   IR PERCUTANEOUS ART THROMBECTOMY/INFUSION INTRACRANIAL INC DIAG ANGIO  01/21/2022   IR PERCUTANEOUS ART THROMBECTOMY/INFUSION INTRACRANIAL INC DIAG ANGIO  01/28/2022   IR US GUIDE VASC ACCESS RIGHT  01/22/2022   PICC LINE PLACE PERIPHERAL ([place] HX) Right    for use of Levaquin & Vancomycin, of note: she reports that she had a "flulike feeling"    RADIOLOGY WITH ANESTHESIA N/A 01/21/2022   Procedure: IR WITH ANESTHESIA;  Surgeon: Luanne Bras, MD;  Location: [place];  Service: Radiology;  Laterality: N/A;   RADIOLOGY WITH ANESTHESIA N/A 01/28/2022   Procedure: IR WITH ANESTHESIA;  Surgeon: Radiologist,  Medication, MD;  Location: [place];  Service: Radiology;  Laterality: N/A;   [place]   Patient Active Problem List   Diagnosis Date Noted   Aphasia 03/31/2022   Snores 03/31/2022   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   UTI (urinary tract infection) 02/04/2022   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   Stroke determined by clinical assessment ([place]) 01/28/2022   Middle cerebral artery embolism, left 01/28/2022   H/O ischemic left MCA stroke    Acute respiratory failure (HCC)    CVA (cerebral vascular accident) ([place]) 01/21/2022   NSTEMI (non-ST elevated myocardial infarction) ([place]) 01/21/2022   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   HFrEF (heart failure with reduced ejection fraction) ([place]) 01/21/2022   Hyperlipidemia 01/21/2022   Lumbar stenosis 01/21/2022   Stroke (cerebrum) ([place]) 01/21/2022   S/P lumbar spinal fusion 11/15/2014   Lumbar discitis 11/29/2013   Discitis 11/24/2013   Diarrhea 11/17/2013   Osteomyelitis of lumbar spine ([place]) 11/16/2013   Hypertension    Asthma    Anxiety     ONSET DATE: 01/28/22  REFERRING DIAG: I63.9 (ICD-10-CM) - Cerebral infarction, unspecified   THERAPY DIAG:  Hemiplegia and hemiparesis following cerebral infarction affecting right dominant side (HCC)  Muscle weakness (generalized)  Other lack of coordination  Rationale for Evaluation and Treatment Rehabilitation  SUBJECTIVE:   SUBJECTIVE STATEMENT: "I'm not having any pain, but I can't feel anything in my hand" Pt accompanied by: self  PERTINENT HISTORY: PMH: HTN, cervical radiculopathy, hepatitis, osteomyelitis/discitis, lumbar stenosis with foot drop anxiety, GERD, insomnia  PRECAUTIONS: Fall  WEIGHT BEARING RESTRICTIONS  was awaiting spinal fusion surgery, limit bending, twisting, lifting >10#  PAIN:  Are you having pain? No  FALLS: Has patient fallen in last 6 months? Yes. Number of falls  2  LIVING ENVIRONMENT: Lives with: lives with their spouse Lives in: House/apartment Stairs: Yes: Internal: full flights of steps; on right going up and External: 7 steps; on left going up Has following equipment at home: Single point cane, Walker - 2 wheeled, Walker - 4 wheeled, shower chair, and bed side commode  PLOF: Independent  PATIENT GOALS I want my arm back.    OBJECTIVE:   HAND DOMINANCE: Right  ADLs: Transfers/ambulation related to ADLs: Mod I with no use of  Eating: Husband assists with cutting, feeding self with L hand Grooming: utilizing L hand with opening and brushing teeth UB Dressing: unable to manage buttons, otherwise Mod I LB Dressing: unable to tie shoes, just slipping on toes Toileting: Mod I with use of L hand Bathing: Mod I, utilizing L hand to shave Tub Shower transfers: Mod I Equipment: Shower seat with back, Walk in shower, and bed side commode   IADLs: Light housekeeping: laundry and hanging up clothes, wipes counters/sink, needs assist to fold clothing Meal Prep: no, did not cook pre-stroke Medication management: husband opens pill bottles Handwriting: unable  MOBILITY STATUS: Needs Assist: Supervision - Mod I without use of AD  POSTURE COMMENTS:  No Significant postural limitations  ACTIVITY TOLERANCE: Activity tolerance: WNL for tasks assessed on eval  FUNCTIONAL OUTCOME MEASURES: FOTO: 49  UPPER EXTREMITY ROM     Active ROM Right eval Left eval  Shoulder flexion WNL WNL  Shoulder abduction    Shoulder adduction    Shoulder extension    Shoulder internal rotation    Shoulder external rotation    Elbow flexion    Elbow extension -5 WNL  Wrist flexion 38 WNL  Wrist extension 50 WNL  Wrist ulnar deviation    Wrist radial deviation    Wrist pronation    Wrist supination    (Blank rows = not tested)   UPPER EXTREMITY MMT:     MMT Right eval Left eval  Shoulder flexion 4+/5   Shoulder abduction    Shoulder adduction     Shoulder extension    Shoulder internal rotation    Shoulder external rotation    Middle trapezius    Lower trapezius    Elbow flexion 4+/5   Elbow extension 4+/5   Wrist flexion    Wrist extension    Wrist ulnar deviation    Wrist radial deviation    Wrist pronation    Wrist supination    (Blank rows = not tested)  HAND FUNCTION: Loose gross grasp, unable to squeeze dynamometer 06/06/22: R: 15#  COORDINATION: Box and Blocks:  Right 10 blocks, Left 53 blocks Modified performance with RUE with therapist holding blocks in palm and pt able to complete gross grasp to pick up one at a time from OT palm of hand, minimal hand opening/closing 04/02/22: Right: 16 blocks  SENSATION: Light touch: [facility]  Stereognosis: Impaired  Hot/Cold: Impaired  Proprioception: Impaired   COGNITION: Overall cognitive status: Within functional limits for tasks assessed  VISION: Subjective report: Pt reports that her R eye does not close as well and that she feels that her L eye is compensating for her R eye. Baseline vision: Wears glasses all the time  VISION ASSESSMENT: To be further assessed in functional context  --------------------------------------------------------------------------------------------------------------------------------------------------------------------- (objective measures above completed at initial evaluation unless otherwise dated)  TODAY'S TREATMENT:  06/13/22 Wrist: placed hand on medium sized ball, rolling forward/backward to facilitate wrist extension and flexion to break up tone.  Transitioned to rolling ball side <> side for radial/ulnar deviation.  Pt completed with focus on sustained hold in each position.  Engaged in "tapple" game with forearm placed on table top and game piece set on elevated surface to allow for increased wrist extension.  OT challenged pt to push down each lever with index finger to challenge finger isolation and wrist flexion/extension.  Pt  required increased effort and use of wrist movements to compensate for decreased finger movements.   Therapeutic exercise: OT reviewed nerve glides, PROM wrist flexion/extension, PROM thumb flexion/extension with pt demonstrating good technique.  OT instructed pt in prayer stretch and hold with focus on R wrist extension.  Engaged in wrist flexion and extension with red theraband with forearm stabilized on table top and anchoring of theraband in opposite hand.  Pt tolerated 4 wrist extension against resistance and 6 wrist flexion against resistance.  OT educating on quality of movement and sustained hold for ~ 3 seconds vs increased repetitions.  Completed "wringing out" wash cloth with alternating movements of wrist extension/flexion.    06/11/22 Resistance Clothespins: 1-2# with RUE for low and mid functional reaching and sustained pinch. Pt required min increased effort initially, however with repetition demonstrating improved grip strength.  OT directed pt in lateral pinch as well as 3 jaw chuck pinch.  Increased challenge to incorporating shoulder external rotation and forearm supination to move clothespins from horizontal dowel to vertical dowel to incorporate functional reach.  OT further increased challenge to adding 4# resistance clothespin. Pt able to transfer clothespins from one horizontal dowel to the other as well as to vertical dowel.  Pt self-selecting rest break with horizontal reach due to fatigue in upper arm/shoulder area.   Stacking cups: engaged in stacking cups into pyramid shape with focus on increased shoulder flexion during functional/sustained grasp.  Pt placing hand on top of cups due to decreased thumb extension with decreased web space.  OT directed pt to attempt picking up cup from side of cups to focus on increased thumb extension.  Pt able to complete with unstacking cups and stacking in one straight stack with improved grasp pattern.  OT challenged pt attempt pyramid stack  with full grasp on cups.  Pt able to complete with 4 drops. Coordination: attempted picking up checker pieces with R hand with focus on use of finger tips.  Pt unable to complete in this manner, but was able to complete with use of gross grasp which is still improved from prior sessions.   Thumb PROM: OT instructed in thumb extension stretch as well as purposeful opening between each step of thumb opposition for additional stretch.  Pt completed both, demonstrating good technique.    06/06/22 9 hole peg test: Pt able to place 5 pegs in 2 min time limit with use of gross grasp when picking up pegs from container.  Pt utilizing side of container to  slide pegs into hand and then placing pegs into peg holes from ulnar side of hand. Pt dropping pegs frequently due to decreased coordination, motor control, and sensation.   Meal prep: pt reports that she is baking, but does notice that she has more difficulty when picking up casserole dishes, stirring items, and does not trust herself to complete any cutting of items.  Pt states that she tries to use her R arm as much as possible, but gets frustrated. FOTO: 50. Pt continues to report decreased ability to pick up coins from table top, brush/comb hair, and placing items in cabinet at shoulder height. Grip: engaged in resistive task with velcro balls to challenge wrist extension and sustained pinch to remove balls from surface.  Pt demonstrating good 3 jaw chuck when picking up balls and removing.  Increased challenge to in-hand manipulation, however pt still with decreased thumb movement to manipulate items.    PATIENT EDUCATION: Education details: ongoing education with focus on coordination and progressive ROM Person educated: Patient Education method: Explanation, Demonstration, and Verbal cues Education comprehension: verbalized understanding and needs further education   HOME EXERCISE PROGRAM: Fine motor coordination handout  Access Code:  [email or web address] URL: [email or web address] Date: 06/13/2022 Prepared by: [facility] Out[facility]  Rehab - [facility]  Exercises - Seated Digit Tendon Gliding  - 2 x daily - 1 sets - 10 reps - Seated Wrist Flexion Extension PROM  - 2 x daily - 2 sets - 10 reps - 5-10 sec hold - Thumb MP Flexion Extension  - 2 x daily - 2 sets - 10 reps - 5-10 sec hold - Wrist Prayer Stretch  - 2 x daily - 2 sets - 10 reps - 5-10 sec hold - Wrist Flexion and Extension with Resistance Bar  - 1 x daily - 3 x weekly - 2 sets - 10 reps - Wrist Extension with Resistance  - 1 x daily - 3 x weekly - 2 sets - 5 reps - 3 sec hold - Wrist Flexion with Resistance  - 1 x daily - 3 x weekly - 2 sets - 5 reps - 3 sec hold      GOALS: Goals reviewed with patient? Yes  SHORT TERM GOALS: Target date: 06/27/22   Pt will be independent with advanced HEP for fine and gross motor control.  Baseline:  Goal status: IN PROGRESS  2.   Pt will demonstrate improved UE functional use for ADLs as evidenced by increasing box/ blocks score by 4 blocks with RUE.  Baseline: R: 20 blocks and L: 53  Goal status: IN PROGRESS  3.   Pt will demonstrate improved wrist mobility to allow for increased ease and independence with combing/brushing hair.  Baseline:  Goal status: IN PROGRESS   LONG TERM GOALS: Target date: 07/18/22  Pt will demonstrate increased hand function and grasp to open personal medication bottles (including child proof) and dispense meds with <2 drops of medication. Baseline:  Goal status: IN PROGRESS  2.  Pt will demonstrate use of BUE together to engage in functional IADL tasks (simple meal prep, folding laundry, etc). Baseline:  Goal status: IN PROGRESS  3.  Pt will demonstrate increased functional reach and grasp to obtain or place light weight item from/on moderate height shelf to simulate IADLs. Baseline:  Goal status: IN PROGRESS  4. Pt will increase RUE grip strength by 5# to increase  success with holding and manipulating dishes and pots/pain for cooking.  Baseline: 15#  Goal status:  IN PROGRESS  5.  Pt will be able to complete 9 hole peg test with RUE in 2 min time limit to demonstrate increased fine motor control as needed for ADLs/IADLs.  Baseline:   Goal status: IN PROGRESS  6.  Pt will demonstrate spasticity management strategies.  Baseline:  Goal status: IN PROGRESS   ASSESSMENT:  CLINICAL IMPRESSION: Pt reporting fatigue and mild onset of pain to 2/10 in R wrist with wrist extension exercises.  Pt demonstrating good technique and desire to focus on ROM in wrist as well as index and long finger.  PERFORMANCE DEFICITS in functional skills including ADLs, IADLs, coordination, dexterity, sensation, ROM, strength, FMC, GMC, balance, body mechanics, decreased knowledge of use of DME, and UE functional use and psychosocial skills including environmental adaptation, habits, and routines and behaviors.   IMPAIRMENTS are limiting patient from ADLs, IADLs, and work.   COMORBIDITIES may have co-morbidities  that affects occupational performance. Patient will benefit from skilled OT to address above impairments and improve overall function.  MODIFICATION OR ASSISTANCE TO COMPLETE EVALUATION: Min-Moderate modification of tasks or assist with assess necessary to complete an evaluation.  OT OCCUPATIONAL PROFILE AND HISTORY: Detailed assessment: Review of records and additional review of physical, cognitive, psychosocial history related to current functional performance.  CLINICAL DECISION MAKING: Moderate - several treatment options, min-mod task modification necessary  REHAB POTENTIAL: Good  EVALUATION COMPLEXITY: Moderate    PLAN: OT FREQUENCY: 2x/week  OT DURATION: 6 weeks  PLANNED INTERVENTIONS: self care/ADL training, therapeutic exercise, therapeutic activity, neuromuscular re-education, manual therapy, passive range of motion, balance training, functional  mobility training, splinting, electrical stimulation, ultrasound, compression bandaging, moist heat, cryotherapy, patient/family education, psychosocial skills training, energy conservation, coping strategies training, and DME and/or AE instructions  RECOMMENDED OTHER SERVICES: N/A  CONSULTED AND AGREED WITH PLAN OF CARE: Patient and family member/caregiver  PLAN FOR NEXT SESSION: to see ortho PT for shoulder rehab - being followed by Dr. Everardo Pacific  Engage in wrist ROM. Gross grasp and reach activities.  Massage (as appropriate) and tone management.   Rosalio Loud, OTR/L 06/13/2022, 9:38 AM

## 2022-06-16 ENCOUNTER — Ambulatory Visit: Payer: Medicare Other

## 2022-06-16 DIAGNOSIS — R208 Other disturbances of skin sensation: Secondary | ICD-10-CM | POA: Diagnosis not present

## 2022-06-16 DIAGNOSIS — R482 Apraxia: Secondary | ICD-10-CM

## 2022-06-16 DIAGNOSIS — R4701 Aphasia: Secondary | ICD-10-CM | POA: Diagnosis not present

## 2022-06-16 DIAGNOSIS — R41841 Cognitive communication deficit: Secondary | ICD-10-CM | POA: Diagnosis not present

## 2022-06-16 DIAGNOSIS — R471 Dysarthria and anarthria: Secondary | ICD-10-CM | POA: Diagnosis not present

## 2022-06-16 DIAGNOSIS — I69351 Hemiplegia and hemiparesis following cerebral infarction affecting right dominant side: Secondary | ICD-10-CM | POA: Diagnosis not present

## 2022-06-16 DIAGNOSIS — M6281 Muscle weakness (generalized): Secondary | ICD-10-CM | POA: Diagnosis not present

## 2022-06-16 DIAGNOSIS — R278 Other lack of coordination: Secondary | ICD-10-CM | POA: Diagnosis not present
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
## 2022-06-17 DIAGNOSIS — M75111 Incomplete rotator cuff tear or rupture of right shoulder, not specified as traumatic: Secondary | ICD-10-CM | POA: Diagnosis not present

## 2022-06-17 DIAGNOSIS — M6281 Muscle weakness (generalized): Secondary | ICD-10-CM | POA: Diagnosis not present

## 2022-06-18 ENCOUNTER — Ambulatory Visit: Payer: Federal, State, Local not specified - PPO

## 2022-06-18 ENCOUNTER — Ambulatory Visit: Payer: Federal, State, Local not specified - PPO | Admitting: Occupational Therapy

## 2022-06-18 DIAGNOSIS — R471 Dysarthria and anarthria: Secondary | ICD-10-CM | POA: Diagnosis not present

## 2022-06-18 DIAGNOSIS — I69351 Hemiplegia and hemiparesis following cerebral infarction affecting right dominant side: Secondary | ICD-10-CM | POA: Diagnosis not present

## 2022-06-18 DIAGNOSIS — R208 Other disturbances of skin sensation: Secondary | ICD-10-CM | POA: Diagnosis not present

## 2022-06-18 DIAGNOSIS — M6281 Muscle weakness (generalized): Secondary | ICD-10-CM | POA: Diagnosis not present

## 2022-06-18 DIAGNOSIS — R278 Other lack of coordination: Secondary | ICD-10-CM | POA: Diagnosis not present

## 2022-06-18 DIAGNOSIS — R482 Apraxia: Secondary | ICD-10-CM

## 2022-06-18 DIAGNOSIS — R4701 Aphasia: Secondary | ICD-10-CM

## 2022-06-18 DIAGNOSIS — R41841 Cognitive communication deficit: Secondary | ICD-10-CM | POA: Diagnosis not present
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
## 2022-06-18 NOTE — Therapy (Signed)
OUTPATIENT OCCUPATIONAL THERAPY  Treatment Note  Patient Name: Chloe Johnson MRN: [id number] DOB:Jul 23, 1955, 67 y.o., female Today's Date: 06/18/2022  PCP: Thana Ates, MD REFERRING PROVIDER: Horton Chin, MD    OT End of Session - 06/18/22 1325     Visit Number 26    Number of Visits 35    Date for OT Re-Evaluation 07/18/22    Authorization Type Medicare A&B    OT Start Time 1321    OT Stop Time 1401    OT Time Calculation (min) 40 min    Activity Tolerance Patient tolerated treatment well    Behavior During Therapy WFL for tasks assessed/performed                                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/16/2013   L3-4/notes 11/24/2013, arms, neck   Exertional asthma    GERD (gastroesophageal reflux disease)    Heart attack (HCC) 01/18/2022   Hypertension    Kidney stones    "have always passed them"   Neck pain    Osteomyelitis (HCC) 11/23/2013   osteomyelitis, discitis    Sleep concern    uses Prozac for sleep    Stroke (HCC) 01/21/2022   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; left with aphasia, dysarthria, and apraxia   Past Surgical History:  Procedure Laterality Date   BREAST CYST EXCISION Left 2010   "polypectomy"   IR CT HEAD LTD  01/21/2022   IR CT HEAD LTD  01/28/2022   IR PERCUTANEOUS ART THROMBECTOMY/INFUSION INTRACRANIAL INC DIAG ANGIO  01/21/2022   IR PERCUTANEOUS ART THROMBECTOMY/INFUSION INTRACRANIAL INC DIAG ANGIO  01/28/2022   IR US GUIDE VASC ACCESS RIGHT  01/22/2022   PICC LINE PLACE PERIPHERAL ([facility] HX) Right    for use of Levaquin & Vancomycin, of note: she reports that she had a "flulike feeling"    RADIOLOGY WITH ANESTHESIA N/A 01/21/2022   Procedure: IR WITH ANESTHESIA;  Surgeon: Julieanne Cotton, MD;  Location: [facility] OR;  Service: Radiology;  Laterality: N/A;   RADIOLOGY WITH ANESTHESIA N/A 01/28/2022   Procedure: IR WITH ANESTHESIA;  Surgeon: Radiologist,  Medication, MD;  Location: [facility] OR;  Service: Radiology;  Laterality: N/A;   TONSILLECTOMY AND ADENOIDECTOMY  1975   TUBAL LIGATION  1999   Patient Active Problem List   Diagnosis Date Noted   Aphasia 03/31/2022   Snores 03/31/2022   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   UTI (urinary tract infection) 02/04/2022   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   Stroke determined by clinical assessment (HCC) 01/28/2022   Middle cerebral artery embolism, left 01/28/2022   H/O ischemic left MCA stroke    Acute respiratory failure (HCC)    CVA (cerebral vascular accident) (HCC) 01/21/2022   NSTEMI (non-ST elevated myocardial infarction) (HCC) 01/21/2022   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   HFrEF (heart failure with reduced ejection fraction) (HCC) 01/21/2022   Hyperlipidemia 01/21/2022   Lumbar stenosis 01/21/2022   Stroke (cerebrum) (HCC) 01/21/2022   S/P lumbar spinal fusion 11/15/2014   Lumbar discitis 11/29/2013   Discitis 11/24/2013   Diarrhea 11/17/2013   Osteomyelitis of lumbar spine (HCC) 11/16/2013   Hypertension    Asthma    Anxiety     ONSET DATE: 01/28/22  REFERRING DIAG: I63.9 (ICD-10-CM) - Cerebral infarction, unspecified   THERAPY  DIAG:  Hemiplegia and hemiparesis following cerebral infarction affecting right dominant side (HCC)  Muscle weakness (generalized)  Other lack of coordination  Other disturbances of skin sensation  Rationale for Evaluation and Treatment Rehabilitation  SUBJECTIVE:   SUBJECTIVE STATEMENT: "We plan to go out to dinner on Friday, but I don't know where yet" Pt accompanied by: self  PERTINENT HISTORY: PMH: HTN, cervical radiculopathy, hepatitis, osteomyelitis/discitis, lumbar stenosis with foot drop anxiety, GERD, insomnia  PRECAUTIONS: Fall  WEIGHT BEARING RESTRICTIONS  was awaiting spinal fusion surgery, limit bending, twisting, lifting >10#  PAIN:  Are you having pain? No  FALLS: Has patient fallen in  last 6 months? Yes. Number of falls 2  LIVING ENVIRONMENT: Lives with: lives with their spouse Lives in: House/apartment Stairs: Yes: Internal: full flights of steps; on right going up and External: 7 steps; on left going up Has following equipment at home: Single point cane, Walker - 2 wheeled, Walker - 4 wheeled, shower chair, and bed side commode  PLOF: Independent  PATIENT GOALS I want my arm back.    OBJECTIVE:   HAND DOMINANCE: Right  ADLs: Transfers/ambulation related to ADLs: Mod I with no use of  Eating: Husband assists with cutting, feeding self with L hand Grooming: utilizing L hand with opening and brushing teeth UB Dressing: unable to manage buttons, otherwise Mod I LB Dressing: unable to tie shoes, just slipping on toes Toileting: Mod I with use of L hand Bathing: Mod I, utilizing L hand to shave Tub Shower transfers: Mod I Equipment: Shower seat with back, Walk in shower, and bed side commode   IADLs: Light housekeeping: laundry and hanging up clothes, wipes counters/sink, needs assist to fold clothing Meal Prep: no, did not cook pre-stroke Medication management: husband opens pill bottles Handwriting: unable  MOBILITY STATUS: Needs Assist: Supervision - Mod I without use of AD  POSTURE COMMENTS:  No Significant postural limitations  ACTIVITY TOLERANCE: Activity tolerance: WNL for tasks assessed on eval  FUNCTIONAL OUTCOME MEASURES: FOTO: 49  UPPER EXTREMITY ROM     Active ROM Right eval Left eval  Shoulder flexion WNL WNL  Shoulder abduction    Shoulder adduction    Shoulder extension    Shoulder internal rotation    Shoulder external rotation    Elbow flexion    Elbow extension -5 WNL  Wrist flexion 38 WNL  Wrist extension 50 WNL  Wrist ulnar deviation    Wrist radial deviation    Wrist pronation    Wrist supination    (Blank rows = not tested)   UPPER EXTREMITY MMT:     MMT Right eval Left eval  Shoulder flexion 4+/5    Shoulder abduction    Shoulder adduction    Shoulder extension    Shoulder internal rotation    Shoulder external rotation    Middle trapezius    Lower trapezius    Elbow flexion 4+/5   Elbow extension 4+/5   Wrist flexion    Wrist extension    Wrist ulnar deviation    Wrist radial deviation    Wrist pronation    Wrist supination    (Blank rows = not tested)  HAND FUNCTION: Loose gross grasp, unable to squeeze dynamometer 06/06/22: R: 15#  COORDINATION: Box and Blocks:  Right 10 blocks, Left 53 blocks Modified performance with RUE with therapist holding blocks in palm and pt able to complete gross grasp to pick up one at a time from OT palm of hand, minimal hand  opening/closing 04/02/22: Right: 16 blocks  SENSATION: Light touch: WFL Stereognosis: Impaired  Hot/Cold: Impaired  Proprioception: Impaired   COGNITION: Overall cognitive status: Within functional limits for tasks assessed  VISION: Subjective report: Pt reports that her R eye does not close as well and that she feels that her L eye is compensating for her R eye. Baseline vision: Wears glasses all the time  VISION ASSESSMENT: To be further assessed in functional context  --------------------------------------------------------------------------------------------------------------------------------------------------------------------- (objective measures above completed at initial evaluation unless otherwise dated)  TODAY'S TREATMENT:  06/18/22 UE therapeutic activity: Engaged in functional reach and pinch with picking up small rings and removing from 8" cone and then transferring to second cone.  OT increased challenge with shoulder flexion by moving cone to facilitate increased reach.  Pt demonstrating good motor control with reaching, however demonstrating difficulty with picking up ring without use of gross grasp and when stacked on another ring to allow pt to get underneath ring.   [place]: attempting to pick up  variety of small items to progress down towards being able to pick up coins.  Pt is able to pick up checker pieces, small balls, and raised washer with increased time; still unable to pick up flat card board pieces or coins.  OT placed items on wash cloth to allow for increased traction when attempting to pick up flatter pieces, still with little to no success. Placing Connect 4 pieces into Connect 4 grid incorporating precision pinch, graded pressure with grasp/release, wrist extension, and shoulder flexion to reach forward and across midline to place in grid.  Pt dropping pieces initially due to decreased ability to grad pressure due to decreased sensation.      06/13/22 Wrist: placed hand on medium sized ball, rolling forward/backward to facilitate wrist extension and flexion to break up tone.  Transitioned to rolling ball side <> side for radial/ulnar deviation.  Pt completed with focus on sustained hold in each position.  Engaged in "tapple" game with forearm placed on table top and game piece set on elevated surface to allow for increased wrist extension.  OT challenged pt to push down each lever with index finger to challenge finger isolation and wrist flexion/extension.  Pt required increased effort and use of wrist movements to compensate for decreased finger movements.   Therapeutic exercise: OT reviewed nerve glides, PROM wrist flexion/extension, PROM thumb flexion/extension with pt demonstrating good technique.  OT instructed pt in prayer stretch and hold with focus on R wrist extension.  Engaged in wrist flexion and extension with red theraband with forearm stabilized on table top and anchoring of theraband in opposite hand.  Pt tolerated 4 wrist extension against resistance and 6 wrist flexion against resistance.  OT educating on quality of movement and sustained hold for ~ 3 seconds vs increased repetitions.  Completed "wringing out" wash cloth with alternating movements of wrist  extension/flexion.    06/11/22 Resistance Clothespins: 1-2# with RUE for low and mid functional reaching and sustained pinch. Pt required min increased effort initially, however with repetition demonstrating improved grip strength.  OT directed pt in lateral pinch as well as 3 jaw chuck pinch.  Increased challenge to incorporating shoulder external rotation and forearm supination to move clothespins from horizontal dowel to vertical dowel to incorporate functional reach.  OT further increased challenge to adding 4# resistance clothespin. Pt able to transfer clothespins from one horizontal dowel to the other as well as to vertical dowel.  Pt self-selecting rest break with horizontal reach due to  fatigue in upper arm/shoulder area.   Stacking cups: engaged in stacking cups into pyramid shape with focus on increased shoulder flexion during functional/sustained grasp.  Pt placing hand on top of cups due to decreased thumb extension with decreased web space.  OT directed pt to attempt picking up cup from side of cups to focus on increased thumb extension.  Pt able to complete with unstacking cups and stacking in one straight stack with improved grasp pattern.  OT challenged pt attempt pyramid stack with full grasp on cups.  Pt able to complete with 4 drops. Coordination: attempted picking up checker pieces with R hand with focus on use of finger tips.  Pt unable to complete in this manner, but was able to complete with use of gross grasp which is still improved from prior sessions.   Thumb PROM: OT instructed in thumb extension stretch as well as purposeful opening between each step of thumb opposition for additional stretch.  Pt completed both, demonstrating good technique.   PATIENT EDUCATION: Education details: ongoing education with focus on coordination and progressive ROM Person educated: Patient Education method: Explanation, Demonstration, and Verbal cues Education comprehension: verbalized  understanding and needs further education   HOME EXERCISE PROGRAM: Fine motor coordination handout  Access Code: [email or web address] URL: [email or web address] Date: 06/13/2022 Prepared by: [facility] - Outpatient  Rehab - [facility]  Exercises - Seated Digit Tendon Gliding  - 2 x daily - 1 sets - 10 reps - Seated Wrist Flexion Extension PROM  - 2 x daily - 2 sets - 10 reps - 5-10 sec hold - Thumb MP Flexion Extension  - 2 x daily - 2 sets - 10 reps - 5-10 sec hold - Wrist Prayer Stretch  - 2 x daily - 2 sets - 10 reps - 5-10 sec hold - Wrist Flexion and Extension with Resistance Bar  - 1 x daily - 3 x weekly - 2 sets - 10 reps - Wrist Extension with Resistance  - 1 x daily - 3 x weekly - 2 sets - 5 reps - 3 sec hold - Wrist Flexion with Resistance  - 1 x daily - 3 x weekly - 2 sets - 5 reps - 3 sec hold      GOALS: Goals reviewed with patient? Yes  SHORT TERM GOALS: Target date: 06/27/22   Pt will be independent with advanced HEP for fine and gross motor control.  Baseline:  Goal status: IN PROGRESS  2.   Pt will demonstrate improved UE functional use for ADLs as evidenced by increasing box/ blocks score by 4 blocks with RUE.  Baseline: R: 20 blocks and L: 53  Goal status: IN PROGRESS  3.   Pt will demonstrate improved wrist mobility to allow for increased ease and independence with combing/brushing hair.  Baseline:  Goal status: IN PROGRESS   LONG TERM GOALS: Target date: 07/18/22  Pt will demonstrate increased hand function and grasp to open personal medication bottles (including child proof) and dispense meds with <2 drops of medication. Baseline:  Goal status: IN PROGRESS  2.  Pt will demonstrate use of BUE together to engage in functional IADL tasks (simple meal prep, folding laundry, etc). Baseline:  Goal status: IN PROGRESS  3.  Pt will demonstrate increased functional reach and grasp to obtain or place light weight item from/on moderate height shelf  to simulate IADLs. Baseline:  Goal status: IN PROGRESS  4. Pt will increase RUE grip strength by 5# to increase success with  holding and manipulating dishes and pots/pain for cooking.  Baseline: 15#  Goal status: IN PROGRESS  5.  Pt will be able to complete 9 hole peg test with RUE in 2 min time limit to demonstrate increased fine motor control as needed for ADLs/IADLs.  Baseline:   Goal status: IN PROGRESS  6.  Pt will demonstrate spasticity management strategies.  Baseline:  Goal status: IN PROGRESS   ASSESSMENT:  CLINICAL IMPRESSION: Pt continues to demonstrate decreased sensation in thumb and index finger on RUE, limiting ability to modify pinch strength to pick up small items.  Pt able to pick up items with gross grasp or thicker items, however demonstrating difficulty when manipulating grasp to then release into container.    PERFORMANCE DEFICITS in functional skills including ADLs, IADLs, coordination, dexterity, sensation, ROM, strength, FMC, GMC, balance, body mechanics, decreased knowledge of use of DME, and UE functional use and psychosocial skills including environmental adaptation, habits, and routines and behaviors.   IMPAIRMENTS are limiting patient from ADLs, IADLs, and work.   COMORBIDITIES may have co-morbidities  that affects occupational performance. Patient will benefit from skilled OT to address above impairments and improve overall function.  MODIFICATION OR ASSISTANCE TO COMPLETE EVALUATION: Min-Moderate modification of tasks or assist with assess necessary to complete an evaluation.  OT OCCUPATIONAL PROFILE AND HISTORY: Detailed assessment: Review of records and additional review of physical, cognitive, psychosocial history related to current functional performance.  CLINICAL DECISION MAKING: Moderate - several treatment options, min-mod task modification necessary  REHAB POTENTIAL: Good  EVALUATION COMPLEXITY: Moderate    PLAN: OT FREQUENCY:  2x/week  OT DURATION: 6 weeks  PLANNED INTERVENTIONS: self care/ADL training, therapeutic exercise, therapeutic activity, neuromuscular re-education, manual therapy, passive range of motion, balance training, functional mobility training, splinting, electrical stimulation, ultrasound, compression bandaging, moist heat, cryotherapy, patient/family education, psychosocial skills training, energy conservation, coping strategies training, and DME and/or AE instructions  RECOMMENDED OTHER SERVICES: N/A  CONSULTED AND AGREED WITH PLAN OF CARE: Patient and family member/caregiver  PLAN FOR NEXT SESSION: to see ortho PT for shoulder rehab - being followed by Dr. Everardo Pacific  Engage in wrist ROM. WB through RUE.  Gross grasp and reach activities.  Massage (as appropriate) and tone management.   Lovelyn Sheeran, OTR/L 06/18/2022, 1:25 PM

## 2022-06-20 ENCOUNTER — Telehealth: Payer: Self-pay

## 2022-06-20 NOTE — Telephone Encounter (Signed)
[facility] called in to ask for the last office note for patient to be faxed over, faxed over last office note  to [phone]

## 2022-06-23 ENCOUNTER — Ambulatory Visit: Payer: Federal, State, Local not specified - PPO

## 2022-06-25 ENCOUNTER — Ambulatory Visit: Payer: Federal, State, Local not specified - PPO

## 2022-06-25 ENCOUNTER — Ambulatory Visit: Payer: Federal, State, Local not specified - PPO | Admitting: Occupational Therapy

## 2022-06-25 DIAGNOSIS — R482 Apraxia: Secondary | ICD-10-CM

## 2022-06-25 DIAGNOSIS — R41841 Cognitive communication deficit: Secondary | ICD-10-CM | POA: Diagnosis not present

## 2022-06-25 DIAGNOSIS — R278 Other lack of coordination: Secondary | ICD-10-CM

## 2022-06-25 DIAGNOSIS — I69351 Hemiplegia and hemiparesis following cerebral infarction affecting right dominant side: Secondary | ICD-10-CM

## 2022-06-25 DIAGNOSIS — M6281 Muscle weakness (generalized): Secondary | ICD-10-CM

## 2022-06-25 DIAGNOSIS — R208 Other disturbances of skin sensation: Secondary | ICD-10-CM | POA: Diagnosis not present

## 2022-06-25 DIAGNOSIS — R471 Dysarthria and anarthria: Secondary | ICD-10-CM | POA: Diagnosis not present

## 2022-06-25 DIAGNOSIS — R4701 Aphasia: Secondary | ICD-10-CM
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
## 2022-06-25 NOTE — Patient Instructions (Signed)
   SCR SCL (scleraderma) STR SQU

## 2022-06-25 NOTE — Therapy (Signed)
OUTPATIENT OCCUPATIONAL THERAPY  Treatment Note  Patient Name: Chloe Johnson MRN: [id number] DOB:[birth year], 67 y.o., female Today's Date: 06/25/2022  PCP: Charlane Ferretti, MD REFERRING PROVIDER: Izora Ribas, MD    OT End of Session - 06/25/22 1326     Visit Number 27    Number of Visits 35    Date for OT Re-Evaluation 07/18/22    Authorization Type Medicare A&B    OT Start Time 1321    OT Stop Time 1401    OT Time Calculation (min) 40 min    Activity Tolerance Patient tolerated treatment well    Behavior During Therapy WFL for tasks assessed/performed                                 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/16/2013   L3-4/notes 11/24/2013, arms, neck   Exertional asthma    GERD (gastroesophageal reflux disease)    Heart attack ([place]) 01/18/2022   Hypertension    Kidney stones    "have always passed them"   Neck pain    Osteomyelitis ([place]) 11/23/2013   osteomyelitis, discitis    Sleep concern    uses Prozac for sleep    Stroke ([place]) 01/21/2022   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; left with aphasia, dysarthria, and apraxia   Past Surgical History:  Procedure Laterality Date   BREAST CYST EXCISION Left 2010   "polypectomy"   IR CT HEAD LTD  01/21/2022   IR CT HEAD LTD  01/28/2022   IR PERCUTANEOUS ART THROMBECTOMY/INFUSION INTRACRANIAL INC DIAG ANGIO  01/21/2022   IR PERCUTANEOUS ART THROMBECTOMY/INFUSION INTRACRANIAL INC DIAG ANGIO  01/28/2022   IR US GUIDE VASC ACCESS RIGHT  01/22/2022   PICC LINE PLACE PERIPHERAL ([place] [facility]) Right    for use of Levaquin & Vancomycin, of note: she reports that she had a "flulike feeling"    RADIOLOGY WITH ANESTHESIA N/A 01/21/2022   Procedure: IR WITH ANESTHESIA;  Surgeon: Luanne Bras, MD;  Location: [facility];  Service: Radiology;  Laterality: N/A;   RADIOLOGY WITH ANESTHESIA N/A 01/28/2022   Procedure: IR WITH ANESTHESIA;  Surgeon: Radiologist,  Medication, MD;  Location: [facility];  Service: Radiology;  Laterality: N/A;   [place]   Patient Active Problem List   Diagnosis Date Noted   Aphasia 03/31/2022   Snores 03/31/2022   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   UTI (urinary tract infection) 02/04/2022   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   Stroke determined by clinical assessment ([place]) 01/28/2022   Middle cerebral artery embolism, left 01/28/2022   H/O ischemic left MCA stroke    Acute respiratory failure (HCC)    CVA (cerebral vascular accident) ([place]) 01/21/2022   NSTEMI (non-ST elevated myocardial infarction) ([place]) 01/21/2022   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   HFrEF (heart failure with reduced ejection fraction) ([place]) 01/21/2022   Hyperlipidemia 01/21/2022   Lumbar stenosis 01/21/2022   Stroke (cerebrum) ([place]) 01/21/2022   S/P lumbar spinal fusion 11/15/2014   Lumbar discitis 11/29/2013   Discitis 11/24/2013   Diarrhea 11/17/2013   Osteomyelitis of lumbar spine ([place]) 11/16/2013   Hypertension    Asthma    Anxiety     ONSET DATE: 01/28/22  REFERRING DIAG: I63.9 (ICD-10-CM) - Cerebral infarction, unspecified  THERAPY DIAG:  Hemiplegia and hemiparesis following cerebral infarction affecting right dominant side (HCC)  Muscle weakness (generalized)  Other lack of coordination  Other disturbances of skin sensation  Rationale for Evaluation and Treatment Rehabilitation  SUBJECTIVE:   SUBJECTIVE STATEMENT: "I had a really good weekend." Pt accompanied by: self  PERTINENT HISTORY: PMH: HTN, cervical radiculopathy, hepatitis, osteomyelitis/discitis, lumbar stenosis with foot drop anxiety, GERD, insomnia  PRECAUTIONS: Fall  WEIGHT BEARING RESTRICTIONS  was awaiting spinal fusion surgery, limit bending, twisting, lifting >10#  PAIN:  Are you having pain? No  FALLS: Has patient fallen in last 6 months? Yes. Number of falls  2  LIVING ENVIRONMENT: Lives with: lives with their spouse Lives in: House/apartment Stairs: Yes: Internal: full flights of steps; on right going up and External: 7 steps; on left going up Has following equipment at home: Single point cane, Walker - 2 wheeled, Walker - 4 wheeled, shower chair, and bed side commode  PLOF: Independent  PATIENT GOALS I want my arm back.    OBJECTIVE:   HAND DOMINANCE: Right  ADLs: Transfers/ambulation related to ADLs: Mod I with no use of  Eating: Husband assists with cutting, feeding self with L hand Grooming: utilizing L hand with opening and brushing teeth UB Dressing: unable to manage buttons, otherwise Mod I LB Dressing: unable to tie shoes, just slipping on toes Toileting: Mod I with use of L hand Bathing: Mod I, utilizing L hand to shave Tub Shower transfers: Mod I Equipment: Shower seat with back, Walk in shower, and bed side commode   IADLs: Light housekeeping: laundry and hanging up clothes, wipes counters/sink, needs assist to fold clothing Meal Prep: no, did not cook pre-stroke Medication management: husband opens pill bottles Handwriting: unable  MOBILITY STATUS: Needs Assist: Supervision - Mod I without use of AD  POSTURE COMMENTS:  No Significant postural limitations  ACTIVITY TOLERANCE: Activity tolerance: WNL for tasks assessed on eval  FUNCTIONAL OUTCOME MEASURES: FOTO: 49  UPPER EXTREMITY ROM     Active ROM Right eval Left eval  Shoulder flexion WNL WNL  Shoulder abduction    Shoulder adduction    Shoulder extension    Shoulder internal rotation    Shoulder external rotation    Elbow flexion    Elbow extension -5 WNL  Wrist flexion 38 WNL  Wrist extension 50 WNL  Wrist ulnar deviation    Wrist radial deviation    Wrist pronation    Wrist supination    (Blank rows = not tested)   UPPER EXTREMITY MMT:     MMT Right eval Left eval  Shoulder flexion 4+/5   Shoulder abduction    Shoulder adduction     Shoulder extension    Shoulder internal rotation    Shoulder external rotation    Middle trapezius    Lower trapezius    Elbow flexion 4+/5   Elbow extension 4+/5   Wrist flexion    Wrist extension    Wrist ulnar deviation    Wrist radial deviation    Wrist pronation    Wrist supination    (Blank rows = not tested)  HAND FUNCTION: Loose gross grasp, unable to squeeze dynamometer 06/06/22: R: 15#  COORDINATION: Box and Blocks:  Right 10 blocks, Left 53 blocks Modified performance with RUE with therapist holding blocks in palm and pt able to complete gross grasp to pick up one at a time from OT palm of hand, minimal hand opening/closing 04/02/22: Right: 16 blocks  SENSATION: Light  touch: WFL Stereognosis: Impaired  Hot/Cold: Impaired  Proprioception: Impaired   COGNITION: Overall cognitive status: Within functional limits for tasks assessed  VISION: Subjective report: Pt reports that her R eye does not close as well and that she feels that her L eye is compensating for her R eye. Baseline vision: Wears glasses all the time  VISION ASSESSMENT: To be further assessed in functional context  --------------------------------------------------------------------------------------------------------------------------------------------------------------------- (objective measures above completed at initial evaluation unless otherwise dated)  TODAY'S TREATMENT:  06/25/22 Grip/pinch strengthening: attempted grip strengthening with resistive ball with loops, however decreased thumb extension limited completion.  Therefore transitioned to use of Velcro board with focus on pinch and rotation with pt completing 3 jaw chuck.  Pt demonstrating mod cues for forearm to compensate for decreased  Finger isolation: engaged in isolated finger extension with pt able to demonstrate min extension with each digit, except thumb.  Pt demonstrating thumb abduction/adduction but no extension.  Reiterated  isolated movements in clasped hand position, on table top, and with blocking as needed. Forearm stretch: engaged in supination with AAROM followed by hold in supination to increase stretch.  Pt reports unable to maintain position, however able to tolerate stretch. Hand Gripper: with RUE on level 10# with green spring. Pt picked up 1 inch blocks with gripper with 2 drops and little to no difficulty. OT increased resistance to 15#.  Pt fatiguing halfway through due to increased demand.  Pt then able to pick up blocks and transfer into container without hand gripper.  Pt demonstrating improved coordination and motor control when picking up and stacking blocks.    06/18/22 UE therapeutic activity: Engaged in functional reach and pinch with picking up small rings and removing from 8" cone and then transferring to second cone.  OT increased challenge with shoulder flexion by moving cone to facilitate increased reach.  Pt demonstrating good motor control with reaching, however demonstrating difficulty with picking up ring without use of gross grasp and when stacked on another ring to allow pt to get underneath ring.   FMC: attempting to pick up variety of small items to progress down towards being able to pick up coins.  Pt is able to pick up checker pieces, small balls, and raised washer with increased time; still unable to pick up flat card board pieces or coins.  OT placed items on wash cloth to allow for increased traction when attempting to pick up flatter pieces, still with little to no success. Placing Connect 4 pieces into Connect 4 grid incorporating precision pinch, graded pressure with grasp/release, wrist extension, and shoulder flexion to reach forward and across midline to place in grid.  Pt dropping pieces initially due to decreased ability to grad pressure due to decreased sensation.      06/13/22 Wrist: placed hand on medium sized ball, rolling forward/backward to facilitate wrist extension and  flexion to break up tone.  Transitioned to rolling ball side <> side for radial/ulnar deviation.  Pt completed with focus on sustained hold in each position.  Engaged in "tapple" game with forearm placed on table top and game piece set on elevated surface to allow for increased wrist extension.  OT challenged pt to push down each lever with index finger to challenge finger isolation and wrist flexion/extension.  Pt required increased effort and use of wrist movements to compensate for decreased finger movements.   Therapeutic exercise: OT reviewed nerve glides, PROM wrist flexion/extension, PROM thumb flexion/extension with pt demonstrating good technique.  OT instructed pt in  prayer stretch and hold with focus on R wrist extension.  Engaged in wrist flexion and extension with red theraband with forearm stabilized on table top and anchoring of theraband in opposite hand.  Pt tolerated 4 wrist extension against resistance and 6 wrist flexion against resistance.  OT educating on quality of movement and sustained hold for ~ 3 seconds vs increased repetitions.  Completed "wringing out" wash cloth with alternating movements of wrist extension/flexion.   PATIENT EDUCATION: Education details: ongoing education with focus on coordination and progressive ROM Person educated: Patient Education method: Explanation, Demonstration, and Verbal cues Education comprehension: verbalized understanding and needs further education   HOME EXERCISE PROGRAM: Fine motor coordination handout  Access Code: [email or web address] URL: [email or web address] Date: 06/13/2022 Prepared by: [facility]  Exercises - Seated Digit Tendon Gliding  - 2 x daily - 1 sets - 10 reps - Seated Wrist Flexion Extension PROM  - 2 x daily - 2 sets - 10 reps - 5-10 sec hold - Thumb MP Flexion Extension  - 2 x daily - 2 sets - 10 reps - 5-10 sec hold - Wrist Prayer Stretch  - 2 x daily - 2 sets - 10 reps  - 5-10 sec hold - Wrist Flexion and Extension with Resistance Bar  - 1 x daily - 3 x weekly - 2 sets - 10 reps - Wrist Extension with Resistance  - 1 x daily - 3 x weekly - 2 sets - 5 reps - 3 sec hold - Wrist Flexion with Resistance  - 1 x daily - 3 x weekly - 2 sets - 5 reps - 3 sec hold      GOALS: Goals reviewed with patient? Yes  SHORT TERM GOALS: Target date: 06/27/22   Pt will be independent with advanced HEP for fine and gross motor control.  Baseline:  Goal status: IN PROGRESS  2.   Pt will demonstrate improved UE functional use for ADLs as evidenced by increasing box/ blocks score by 4 blocks with RUE.  Baseline: R: 20 blocks and L: 53  Goal status: IN PROGRESS  3.   Pt will demonstrate improved wrist mobility to allow for increased ease and independence with combing/brushing hair.  Baseline:  Goal status: IN PROGRESS   LONG TERM GOALS: Target date: 07/18/22  Pt will demonstrate increased hand function and grasp to open personal medication bottles (including child proof) and dispense meds with <2 drops of medication. Baseline:  Goal status: IN PROGRESS  2.  Pt will demonstrate use of BUE together to engage in functional IADL tasks (simple meal prep, folding laundry, etc). Baseline:  Goal status: IN PROGRESS  3.  Pt will demonstrate increased functional reach and grasp to obtain or place light weight item from/on moderate height shelf to simulate IADLs. Baseline:  Goal status: IN PROGRESS  4. Pt will increase RUE grip strength by 5# to increase success with holding and manipulating dishes and pots/pain for cooking.  Baseline: 15#  Goal status: IN PROGRESS  5.  Pt will be able to complete 9 hole peg test with RUE in 2 min time limit to demonstrate increased fine motor control as needed for ADLs/IADLs.  Baseline:   Goal status: IN PROGRESS  6.  Pt will demonstrate spasticity management strategies.  Baseline:  Goal status: IN PROGRESS   ASSESSMENT:  CLINICAL  IMPRESSION: Pt continues to demonstrate decreased sensation in thumb and index finger on RUE, limiting pinch and manipulation with resistive  grip this session.  Pt tolerating increased resistance with hand gripper, however fatiguing after 15.  Pt demonstrating improved isolation of fingers, still lacking thumb extension but demonstrating thumb abduction/adduction.   PERFORMANCE DEFICITS in functional skills including ADLs, IADLs, coordination, dexterity, sensation, ROM, strength, FMC, GMC, balance, body mechanics, decreased knowledge of use of DME, and UE functional use and psychosocial skills including environmental adaptation, habits, and routines and behaviors.   IMPAIRMENTS are limiting patient from ADLs, IADLs, and work.   COMORBIDITIES may have co-morbidities  that affects occupational performance. Patient will benefit from skilled OT to address above impairments and improve overall function.  MODIFICATION OR ASSISTANCE TO COMPLETE EVALUATION: Min-Moderate modification of tasks or assist with assess necessary to complete an evaluation.  OT OCCUPATIONAL PROFILE AND HISTORY: Detailed assessment: Review of records and additional review of physical, cognitive, psychosocial history related to current functional performance.  CLINICAL DECISION MAKING: Moderate - several treatment options, min-mod task modification necessary  REHAB POTENTIAL: Good  EVALUATION COMPLEXITY: Moderate    PLAN: OT FREQUENCY: 2x/week  OT DURATION: 6 weeks  PLANNED INTERVENTIONS: self care/ADL training, therapeutic exercise, therapeutic activity, neuromuscular re-education, manual therapy, passive range of motion, balance training, functional mobility training, splinting, electrical stimulation, ultrasound, compression bandaging, moist heat, cryotherapy, patient/family education, psychosocial skills training, energy conservation, coping strategies training, and DME and/or AE instructions  RECOMMENDED OTHER  SERVICES: N/A  CONSULTED AND AGREED WITH PLAN OF CARE: Patient and family member/caregiver  PLAN FOR NEXT SESSION: Engage in wrist ROM. WB through [place].  Gross grasp and reach activities.  Massage (as appropriate) and tone management.   Simonne Come, OTR/L 06/25/2022, 1:26 PM

## 2022-06-26 ENCOUNTER — Telehealth: Payer: Self-pay

## 2022-06-26 ENCOUNTER — Ambulatory Visit: Payer: Medicare Other | Attending: Cardiovascular Disease | Admitting: *Deleted

## 2022-06-26 DIAGNOSIS — Z5181 Encounter for therapeutic drug level monitoring: Secondary | ICD-10-CM

## 2022-06-26 DIAGNOSIS — I513 Intracardiac thrombosis, not elsewhere classified: Secondary | ICD-10-CM | POA: Diagnosis not present

## 2022-06-26 DIAGNOSIS — I639 Cerebral infarction, unspecified: Secondary | ICD-10-CM

## 2022-06-26 LAB — POCT INR: INR: 3.8 — AB (ref 2.0–3.0)

## 2022-06-26 NOTE — Telephone Encounter (Signed)
Pt called and made payment today and it was turned in to [place]. Pt state that the due date was 06/16/22 and would like to be updated when form is filled out and also would like it to be faxed to [facility].

## 2022-06-26 NOTE — Patient Instructions (Addendum)
Description   Do not take any warfarin today then continue taking the dose you have been taking, which is, warfarin 1 tablet daily. Continue green leafy veggie to weekly. Recheck INR in 2 weeks.  [facility] [phone]

## 2022-06-26 NOTE — Telephone Encounter (Signed)
Received [place] form. Reached out to patient regarding form fee and had to leave voicemail asking for a call back.

## 2022-06-27 ENCOUNTER — Ambulatory Visit: Payer: Medicare Other | Admitting: Occupational Therapy

## 2022-06-27 ENCOUNTER — Ambulatory Visit: Payer: Medicare Other | Attending: Physical Medicine and Rehabilitation

## 2022-06-27 DIAGNOSIS — R482 Apraxia: Secondary | ICD-10-CM | POA: Diagnosis not present

## 2022-06-27 DIAGNOSIS — I69351 Hemiplegia and hemiparesis following cerebral infarction affecting right dominant side: Secondary | ICD-10-CM | POA: Diagnosis not present

## 2022-06-27 DIAGNOSIS — R208 Other disturbances of skin sensation: Secondary | ICD-10-CM | POA: Insufficient documentation

## 2022-06-27 DIAGNOSIS — R41841 Cognitive communication deficit: Secondary | ICD-10-CM | POA: Insufficient documentation

## 2022-06-27 DIAGNOSIS — R471 Dysarthria and anarthria: Secondary | ICD-10-CM | POA: Insufficient documentation

## 2022-06-27 DIAGNOSIS — M6281 Muscle weakness (generalized): Secondary | ICD-10-CM | POA: Insufficient documentation

## 2022-06-27 DIAGNOSIS — R4701 Aphasia: Secondary | ICD-10-CM | POA: Insufficient documentation

## 2022-06-27 DIAGNOSIS — R278 Other lack of coordination: Secondary | ICD-10-CM | POA: Insufficient documentation

## 2022-06-27 NOTE — Therapy (Signed)
OUTPATIENT OCCUPATIONAL THERAPY  Treatment Note  Patient Name: Chloe Johnson MRN: [id number] DOB:04/19/1956, 66 y.o., female Today's Date: 06/27/2022  PCP: Charlane Ferretti, MD REFERRING PROVIDER: Izora Ribas, MD    OT End of Session - 06/27/22 1047     Visit Number 28    Number of Visits 35    Date for OT Re-Evaluation 07/18/22    Authorization Type Medicare A&B    OT Start Time 1100    OT Stop Time 1143    OT Time Calculation (min) 43 min    Activity Tolerance Patient tolerated treatment well    Behavior During Therapy WFL for tasks assessed/performed                                 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/16/2013   L3-4/notes 11/24/2013, arms, neck   Exertional asthma    GERD (gastroesophageal reflux disease)    Heart attack ([place]) 01/18/2022   Hypertension    Kidney stones    "have always passed them"   Neck pain    Osteomyelitis ([place]) 11/23/2013   osteomyelitis, discitis    Sleep concern    uses Prozac for sleep    Stroke ([place]) 01/21/2022   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; left with aphasia, dysarthria, and apraxia   Past Surgical History:  Procedure Laterality Date   BREAST CYST EXCISION Left 2010   "polypectomy"   IR CT HEAD LTD  01/21/2022   IR CT HEAD LTD  01/28/2022   IR PERCUTANEOUS ART THROMBECTOMY/INFUSION INTRACRANIAL INC DIAG ANGIO  01/21/2022   IR PERCUTANEOUS ART THROMBECTOMY/INFUSION INTRACRANIAL INC DIAG ANGIO  01/28/2022   IR US GUIDE VASC ACCESS RIGHT  01/22/2022   PICC LINE PLACE PERIPHERAL ([place] HX) Right    for use of Levaquin & Vancomycin, of note: she reports that she had a "flulike feeling"    RADIOLOGY WITH ANESTHESIA N/A 01/21/2022   Procedure: IR WITH ANESTHESIA;  Surgeon: Luanne Bras, MD;  Location: [facility];  Service: Radiology;  Laterality: N/A;   RADIOLOGY WITH ANESTHESIA N/A 01/28/2022   Procedure: IR WITH ANESTHESIA;  Surgeon: Radiologist,  Medication, MD;  Location: [facility];  Service: Radiology;  Laterality: N/A;   [place]   Patient Active Problem List   Diagnosis Date Noted   Aphasia 03/31/2022   Snores 03/31/2022   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   UTI (urinary tract infection) 02/04/2022   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   Stroke determined by clinical assessment ([place]) 01/28/2022   Middle cerebral artery embolism, left 01/28/2022   H/O ischemic left MCA stroke    Acute respiratory failure (HCC)    CVA (cerebral vascular accident) ([place]) 01/21/2022   NSTEMI (non-ST elevated myocardial infarction) ([place]) 01/21/2022   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   HFrEF (heart failure with reduced ejection fraction) ([place]) 01/21/2022   Hyperlipidemia 01/21/2022   Lumbar stenosis 01/21/2022   Stroke (cerebrum) ([place]) 01/21/2022   S/P lumbar spinal fusion 11/15/2014   Lumbar discitis 11/29/2013   Discitis 11/24/2013   Diarrhea 11/17/2013   Osteomyelitis of lumbar spine ([place]) 11/16/2013   Hypertension    Asthma    Anxiety     ONSET DATE: 01/28/22  REFERRING DIAG: I63.9 (ICD-10-CM) - Cerebral infarction, unspecified  THERAPY DIAG:  Hemiplegia and hemiparesis following cerebral infarction affecting right dominant side (HCC)  Muscle weakness (generalized)  Other lack of coordination  Other disturbances of skin sensation  Rationale for Evaluation and Treatment Rehabilitation  SUBJECTIVE:   SUBJECTIVE STATEMENT: "My grand babies are at the house." Pt accompanied by: self  PERTINENT HISTORY: PMH: HTN, cervical radiculopathy, hepatitis, osteomyelitis/discitis, lumbar stenosis with foot drop anxiety, GERD, insomnia  PRECAUTIONS: Fall  WEIGHT BEARING RESTRICTIONS  was awaiting spinal fusion surgery, limit bending, twisting, lifting >10#  PAIN:  Are you having pain? No  FALLS: Has patient fallen in last 6 months? Yes. Number of  falls 2  LIVING ENVIRONMENT: Lives with: lives with their spouse Lives in: House/apartment Stairs: Yes: Internal: full flights of steps; on right going up and External: 7 steps; on left going up Has following equipment at home: Single point cane, Walker - 2 wheeled, Walker - 4 wheeled, shower chair, and bed side commode  PLOF: Independent  PATIENT GOALS I want my arm back.    OBJECTIVE:   HAND DOMINANCE: Right  ADLs: Transfers/ambulation related to ADLs: Mod I with no use of  Eating: Husband assists with cutting, feeding self with L hand Grooming: utilizing L hand with opening and brushing teeth UB Dressing: unable to manage buttons, otherwise Mod I LB Dressing: unable to tie shoes, just slipping on toes Toileting: Mod I with use of L hand Bathing: Mod I, utilizing L hand to shave Tub Shower transfers: Mod I Equipment: Shower seat with back, Walk in shower, and bed side commode   IADLs: Light housekeeping: laundry and hanging up clothes, wipes counters/sink, needs assist to fold clothing Meal Prep: no, did not cook pre-stroke Medication management: husband opens pill bottles Handwriting: unable  MOBILITY STATUS: Needs Assist: Supervision - Mod I without use of AD  POSTURE COMMENTS:  No Significant postural limitations  ACTIVITY TOLERANCE: Activity tolerance: WNL for tasks assessed on eval  FUNCTIONAL OUTCOME MEASURES: FOTO: 49  UPPER EXTREMITY ROM     Active ROM Right eval Left eval  Shoulder flexion WNL WNL  Shoulder abduction    Shoulder adduction    Shoulder extension    Shoulder internal rotation    Shoulder external rotation    Elbow flexion    Elbow extension -5 WNL  Wrist flexion 38 WNL  Wrist extension 50 WNL  Wrist ulnar deviation    Wrist radial deviation    Wrist pronation    Wrist supination    (Blank rows = not tested)   UPPER EXTREMITY MMT:     MMT Right eval Left eval  Shoulder flexion 4+/5   Shoulder abduction    Shoulder  adduction    Shoulder extension    Shoulder internal rotation    Shoulder external rotation    Middle trapezius    Lower trapezius    Elbow flexion 4+/5   Elbow extension 4+/5   Wrist flexion    Wrist extension    Wrist ulnar deviation    Wrist radial deviation    Wrist pronation    Wrist supination    (Blank rows = not tested)  HAND FUNCTION: Loose gross grasp, unable to squeeze dynamometer 06/06/22: R: 15#  COORDINATION: Box and Blocks:  Right 10 blocks, Left 53 blocks Modified performance with RUE with therapist holding blocks in palm and pt able to complete gross grasp to pick up one at a time from OT palm of hand, minimal hand opening/closing 04/02/22: Right: 16 blocks  SENSATION:  Light touch: WFL Stereognosis: Impaired  Hot/Cold: Impaired  Proprioception: Impaired   COGNITION: Overall cognitive status: Within functional limits for tasks assessed  VISION: Subjective report: Pt reports that her R eye does not close as well and that she feels that her L eye is compensating for her R eye. Baseline vision: Wears glasses all the time  VISION ASSESSMENT: To be further assessed in functional context  --------------------------------------------------------------------------------------------------------------------------------------------------------------------- (objective measures above completed at initial evaluation unless otherwise dated)  TODAY'S TREATMENT:  06/27/22 Wrist: engaged in rolling ball forward/backward and side to side with focus on wrist flexion/extension and ulnar/radial deviation.  Pt demonstrating increased extension this session.    Stacking cone: engaged in picking up narrow cones in R hand with focus on sustained grasp and ulnar/radial deviation when stacking cones.  Pt demonstrating loose grasp but able to grasp and manipulate to place and remove.  Pt voicing frustration with decreased grasp and tone impacting wrist mobility. Therapeutic exercise:  engaged in wrist flexion/extension, ulnar/radial deviation, and supination/pronation with 1# dumbbell for further grip and ROM strengthening.  OT demonstrating exercises in gravity minimized positioning to increase ROM.   Thumb:  engaged in thumb AROM with MP to further facilitate ROM of IP.  Pt demonstrating good flexion, however unable to extend back to neutral without assistance.  Pt also completing radial adduction with thumb flexion with improved control.  Pt unable to complete any thumb flexion/extension against resistance.   Box and blocks: RUE: 28 blocks in 1 min.  Pt demonstrating improved use of pincer and use of gross grasp intermittently.      06/25/22 Grip/pinch strengthening: attempted grip strengthening with resistive ball with loops, however decreased thumb extension limited completion.  Therefore transitioned to use of Velcro board with focus on pinch and rotation with pt completing 3 jaw chuck.  Pt demonstrating mod cues for forearm to compensate for decreased  Finger isolation: engaged in isolated finger extension with pt able to demonstrate min extension with each digit, except thumb.  Pt demonstrating thumb abduction/adduction but no extension.  Reiterated isolated movements in clasped hand position, on table top, and with blocking as needed. Forearm stretch: engaged in supination with AAROM followed by hold in supination to increase stretch.  Pt reports unable to maintain position, however able to tolerate stretch. Hand Gripper: with RUE on level 10# with green spring. Pt picked up 1 inch blocks with gripper with 2 drops and little to no difficulty. OT increased resistance to 15#.  Pt fatiguing halfway through due to increased demand.  Pt then able to pick up blocks and transfer into container without hand gripper.  Pt demonstrating improved coordination and motor control when picking up and stacking blocks.    06/18/22 UE therapeutic activity: Engaged in functional reach and pinch  with picking up small rings and removing from 8" cone and then transferring to second cone.  OT increased challenge with shoulder flexion by moving cone to facilitate increased reach.  Pt demonstrating good motor control with reaching, however demonstrating difficulty with picking up ring without use of gross grasp and when stacked on another ring to allow pt to get underneath ring.   [place]: attempting to pick up variety of small items to progress down towards being able to pick up coins.  Pt is able to pick up checker pieces, small balls, and raised washer with increased time; still unable to pick up flat card board pieces or coins.  OT placed items on wash cloth to allow for increased  traction when attempting to pick up flatter pieces, still with little to no success. Placing Connect 4 pieces into Connect 4 grid incorporating precision pinch, graded pressure with grasp/release, wrist extension, and shoulder flexion to reach forward and across midline to place in grid.  Pt dropping pieces initially due to decreased ability to grad pressure due to decreased sensation.      PATIENT EDUCATION: Education details: ongoing education with focus on coordination and progressive ROM Person educated: Patient Education method: Explanation, Demonstration, and Verbal cues Education comprehension: verbalized understanding and needs further education   HOME EXERCISE PROGRAM: Fine motor coordination handout  Access Code: [email or web address] URL: [email or web address] Date: 06/27/2022 Prepared by: [facility] - [facility]  Exercises - Seated Digit Tendon Gliding  - 2 x daily - 1 sets - 10 reps - Thumb AROM MP Blocking  - 2 x daily - 2 sets - 10 reps - Thumb Radial Adduction with Thumb Flexion AROM on Table  - 2 x daily - 2 sets - 10 reps - Wrist Prayer Stretch  - 2 x daily - 2 sets - 10 reps - 5-10 sec hold - Wrist Flexion and Extension with Resistance Bar  - 1 x daily - 3 x weekly  - 2 sets - 10 reps - Wrist Extension with Resistance  - 1 x daily - 3 x weekly - 2 sets - 5 reps - 3 sec hold - Wrist Flexion with Resistance  - 1 x daily - 3 x weekly - 2 sets - 5 reps - 3 sec hold - Seated Wrist Radial Deviation with Dumbbell  - 1 x daily - 3 x weekly - 2 sets - 10 reps - Seated Wrist Extension with Dumbbell  - 1 x daily - 3 x weekly - 2 sets - 10 reps - Forearm Supination with Dumbbell  - 1 x daily - 3 x weekly - 2 sets - 10 reps      GOALS: Goals reviewed with patient? Yes  SHORT TERM GOALS: Target date: 06/27/22   Pt will be independent with advanced HEP for fine and gross motor control.  Baseline:  Goal status: Met - 06/27/22  2.   Pt will demonstrate improved UE functional use for ADLs as evidenced by increasing box/ blocks score by 4 blocks with RUE.  Baseline: R: 20 blocks and L: 53  Goal status: Met - 28 blocks on R on 06/27/22  3.   Pt will demonstrate improved wrist mobility to allow for increased ease and independence with combing/brushing hair.  Baseline:  Goal status: Met - 06/27/22   LONG TERM GOALS: Target date: 07/18/22  Pt will demonstrate increased hand function and grasp to open personal medication bottles (including child proof) and dispense meds with <2 drops of medication. Baseline:  Goal status: IN PROGRESS  2.  Pt will demonstrate use of BUE together to engage in functional IADL tasks (simple meal prep, folding laundry, etc). Baseline:  Goal status: IN PROGRESS  3.  Pt will demonstrate increased functional reach and grasp to obtain or place light weight item from/on moderate height shelf to simulate IADLs. Baseline:  Goal status: IN PROGRESS  4. Pt will increase RUE grip strength by 5# to increase success with holding and manipulating dishes and pots/pain for cooking.  Baseline: 15#  Goal status: IN PROGRESS  5.  Pt will be able to complete 9 hole peg test with RUE in 2 min time limit to demonstrate increased fine motor control  as  needed for ADLs/IADLs.  Baseline:   Goal status: IN PROGRESS  6.  Pt will demonstrate spasticity management strategies.  Baseline:  Goal status: IN PROGRESS   ASSESSMENT:  CLINICAL IMPRESSION: Pt continues to demonstrate improved grasp and coordination during structured and functional tasks.  Pt can complete loose gross grasp on small items and complete motion as required for combing hair, however does not have the sustained grasp and ROM to fully brush hair.  Pt pleased with increased thumb mobility this session, however unable to complete extension or movement against resistance.  Pt appreciative of additional exercises to facilitate wrist, thumb, and overall grasp strengthening.    PERFORMANCE DEFICITS in functional skills including ADLs, IADLs, coordination, dexterity, sensation, ROM, strength, FMC, GMC, balance, body mechanics, decreased knowledge of use of DME, and UE functional use and psychosocial skills including environmental adaptation, habits, and routines and behaviors.   IMPAIRMENTS are limiting patient from ADLs, IADLs, and work.   COMORBIDITIES may have co-morbidities  that affects occupational performance. Patient will benefit from skilled OT to address above impairments and improve overall function.  MODIFICATION OR ASSISTANCE TO COMPLETE EVALUATION: Min-Moderate modification of tasks or assist with assess necessary to complete an evaluation.  OT OCCUPATIONAL PROFILE AND HISTORY: Detailed assessment: Review of records and additional review of physical, cognitive, psychosocial history related to current functional performance.  CLINICAL DECISION MAKING: Moderate - several treatment options, min-mod task modification necessary  REHAB POTENTIAL: Good  EVALUATION COMPLEXITY: Moderate    PLAN: OT FREQUENCY: 2x/week  OT DURATION: 6 weeks  PLANNED INTERVENTIONS: self care/ADL training, therapeutic exercise, therapeutic activity, neuromuscular re-education, manual  therapy, passive range of motion, balance training, functional mobility training, splinting, electrical stimulation, ultrasound, compression bandaging, moist heat, cryotherapy, patient/family education, psychosocial skills training, energy conservation, coping strategies training, and DME and/or AE instructions  RECOMMENDED OTHER SERVICES: N/A  CONSULTED AND AGREED WITH PLAN OF CARE: Patient and family member/caregiver  PLAN FOR NEXT SESSION: Engage in wrist, thumb ROM. Attempt thumb extension.  WB through RUE.  Gross grasp and reach activities.  Massage (as appropriate) and tone management.   Rosalio Loud, OTR/L 06/27/2022, 11:44 AM

## 2022-06-27 NOTE — Progress Notes (Addendum)
Office Visit Note  Patient: Chloe Johnson             Date of Birth: 22-Mar-1956           MRN: [id number]             PCP: Charlane Ferretti, MD Referring: Lavone Orn, MD Visit Date: 07/10/2022 Occupation: @GUAROCC$ @  Subjective:  Joint pain, Raynauds  History of Present Illness: Chloe Johnson is a 67 y.o. female in consultation per request of Dr. Laurann Montana for the evaluation of joint pain and Raynauds.  According the patient in 2015 she had a herniated disc for which she had an spinal injection by Dr. Mina Marble.  After that she started having increased lower back pain and she went to see Dr. Ronnald Ramp who did repeat a scan after few months and she was diagnosed with osteomyelitis.  She was treated with antibiotics for about 9 weeks.  She was on narcotics for several months due to severe lower back pain.  In June 2016 she underwent spinal fusion and came off the narcotics.  She states in August 2023 she had a MRI and few days later she had a stroke requiring thrombectomy.  She had another stroke few days later which was massive required thrombectomy.  She has residual right-sided weakness and expressive aphasia since then.  She has been on Coumadin and is followed by Dr. Jenny Reichmann.  She recalls that in April 2023 she went for a walk and walk too much after that she started having some joint pain.  She states that in November 2022 she started having increased neck and lower back pain and also left foot drop.  She was seen by Dr. Ronnald Ramp who placed her on meloxicam and started PT in January 2023.  She states she decided to come off all the medications including Mobic in January 2022.  She states her neck and back pain continues to hurt.  She went for needling and physical therapy.  In April 2023 she started noticing jerking in her left arm.  She tried to come off caffeine.  In May 2023 she noticed noticed a rash on her face and some brain fog.  She also noticed a rash on her arms.  She started experiencing hot flashes and  low-grade fever.  She was seen by Dr. Laurann Montana in July 2023.  He did labs.  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  In August she had MRI followed by stroke.  She has been going to speech therapy and OT.  She has been experiencing discomfort in her hips.  Been having discomfort in her right shoulder joint.  She was seen by Dr. Griffin Basil who diagnosed her with rotator cuff tear.  He gives history of Raynauds since her 51s.  No history of DVTs.      Activities of Daily Living:  Patient reports morning stiffness for 1 hours.   Patient Reports nocturnal pain.  Difficulty dressing/grooming: Reports Difficulty climbing stairs: Reports Difficulty getting out of chair: Reports Difficulty using hands for taps, buttons, cutlery, and/or writing: Reports-since stoke.  Review of Systems  Constitutional:  Positive for fatigue.  HENT:  Positive for mouth sores and mouth dryness.   Eyes:  Positive for dryness.  Respiratory: Negative.  Negative for shortness of breath.   Cardiovascular: Negative.  Negative for chest pain and palpitations.  Gastrointestinal: Negative.  Negative for blood in stool, constipation and diarrhea.  Endocrine: Negative.  Negative for increased urination.  Genitourinary:  Positive for involuntary urination.  Musculoskeletal:  Positive for joint pain, gait problem, joint pain, joint swelling, myalgias, muscle weakness, morning stiffness, muscle tenderness and myalgias.  Skin:  Positive for rash, hair loss and sensitivity to sunlight. Negative for color change.  Allergic/Immunologic: Negative.  Negative for susceptible to infections.  Neurological:  Positive for headaches. Negative for dizziness.  Hematological:  Positive for swollen glands.  Psychiatric/Behavioral:  Negative for depressed mood and sleep disturbance. The patient is nervous/anxious.     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   UTI (urinary tract infection) 02/04/2022   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[place]) 02/04/2022   Stroke determined by clinical assessment ([place]) 01/28/2022   Middle cerebral artery embolism, left 01/28/2022   H/O ischemic left MCA stroke    Acute respiratory failure (HCC)    CVA (cerebral vascular accident) ([place]) 01/21/2022   NSTEMI (non-ST elevated myocardial infarction) ([place]) 01/21/2022   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   HFrEF (heart failure with reduced ejection fraction) ([place]) 01/21/2022   Hyperlipidemia 01/21/2022   Lumbar stenosis 01/21/2022   Stroke (cerebrum) ([place]) 01/21/2022   S/P lumbar spinal fusion 11/15/2014   Lumbar discitis 11/29/2013   Discitis 11/24/2013   Diarrhea 11/17/2013   Osteomyelitis of lumbar spine ([place]) 11/16/2013   Hypertension    Asthma    Anxiety     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/16/2013   L3-4/notes 11/24/2013, arms, neck   Exertional asthma    GERD (gastroesophageal reflux disease)    Heart attack ([place]) 01/18/2022   Hypertension    Kidney stones    "have always passed them"   Neck pain    Osteomyelitis ([place]) 11/23/2013   osteomyelitis, discitis    Sleep concern    uses Prozac for sleep    Stroke ([place]) 01/21/2022   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; left with aphasia, dysarthria, and apraxia    Family History  Problem Relation Age of Onset   Other Mother    Other Father    Hypertension Father    Cancer Sister    Sleep apnea Sister    Past Surgical History:  Procedure Laterality Date   BREAST CYST EXCISION Left 2010   "polypectomy"   IR CT HEAD LTD  01/21/2022   IR CT HEAD LTD  01/28/2022   IR PERCUTANEOUS ART THROMBECTOMY/INFUSION INTRACRANIAL INC DIAG ANGIO  01/21/2022   IR PERCUTANEOUS ART THROMBECTOMY/INFUSION INTRACRANIAL INC DIAG ANGIO  01/28/2022   IR US GUIDE VASC ACCESS RIGHT  01/22/2022   PICC  LINE PLACE PERIPHERAL ([place] HX) Right    for use of Levaquin & Vancomycin, of note: she reports that she had a "flulike feeling"    RADIOLOGY WITH ANESTHESIA N/A 01/21/2022   Procedure: IR WITH ANESTHESIA;  Surgeon: Luanne Bras, MD;  Location: [facility];  Service: Radiology;  Laterality: N/A;   RADIOLOGY WITH ANESTHESIA N/A 01/28/2022   Procedure: IR WITH ANESTHESIA;  Surgeon: Radiologist, Medication, MD;  Location: [facility];  Service: Radiology;  Laterality: N/A;   TONSILLECTOMY AND ADENOIDECTOMY  1975   TUBAL LIGATION  1999   Social History   Social History Narrative   Patient is married and lives at home with her husband Juanda Crumble).  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 Right handed.College education.         Immunization History  Administered Date(s) Administered   PFIZER(Purple Top)SARS-COV-2 Vaccination 06/15/2019, 07/03/2019   Pneumococcal-Unspecified 01/24/2013     Objective: Vital Signs: BP (!) 153/79 (BP Location: Left Arm, Patient Position: Sitting, Cuff Size: Large)   Pulse 61   Resp 14   Ht 5' (1.524 m)   Wt 148 lb 12.8 oz (67.5 kg)   BMI 29.06 kg/m    Physical Exam Vitals and nursing note reviewed.  Constitutional:      Appearance: She is well-developed.  HENT:     Head: Normocephalic and atraumatic.  Eyes:     Conjunctiva/sclera: Conjunctivae normal.  Cardiovascular:     Rate and Rhythm: Normal rate and regular rhythm.     Heart sounds: Normal heart sounds.  Pulmonary:     Effort: Pulmonary effort is normal.     Breath sounds: Normal breath sounds.  Abdominal:     General: Bowel sounds are normal.     Palpations: Abdomen is soft.  Musculoskeletal:     Cervical back: Normal range of motion.  Lymphadenopathy:     Cervical: No cervical adenopathy.  Skin:    General: Skin is warm and dry.     Capillary Refill: Capillary refill takes 2 to 3 seconds.      Comments: Facial erythema was noted.  No sclerodactyly or nailbed capillary changes were noted.  Neurological:     Mental Status: She is alert and oriented to person, place, and time.     Comments: Right-sided hemiparesis and expressive aphasia  Psychiatric:        Behavior: Behavior normal.      Musculoskeletal Exam: C-spine was in good range of motion.  She had no tenderness over thoracic or lumbar spine.  Shoulders, elbows, wrist joints, MCPs PIPs and DIPs were in good range of motion with no synovitis.  Hip joints and knee joints were in good range of motion without any warmth swelling or effusion.  There was no tenderness over ankles or MTP joints.  CDAI Exam: CDAI Score: -- Patient Global: --; Provider Global: -- Swollen: --; Tender: -- Joint Exam 07/10/2022   No joint exam has been documented for this visit   There is currently no information documented on the homunculus. Go to the Rheumatology activity and complete the homunculus joint exam.  Investigation: No additional findings.  Imaging: No results found.  Recent Labs: Lab Results  Component Value Date   WBC 6.5 05/05/2022   HGB 13.2 05/05/2022   PLT 197 05/05/2022   NA 140 05/05/2022   K 3.9 05/05/2022   CL 109 05/05/2022   CO2 22 05/05/2022   GLUCOSE 154 (H) 05/05/2022   BUN 19 05/05/2022   CREATININE 0.79 05/05/2022   BILITOT 0.3 05/05/2022   ALKPHOS 74 05/05/2022   AST 10 (L) 05/05/2022   ALT 13 05/05/2022   PROT 7.1 05/05/2022   ALBUMIN 4.5 05/05/2022   CALCIUM 9.6 05/05/2022   GFRAA >60 11/15/2014   May 05, 2022 Lupus anticoagulant negative, anticardiolipin negative, April 08, 2022 beta-2 GP 1 negative.   Speciality Comments: No specialty comments available.  Procedures:  No procedures performed Allergies: Doxycycline, Erythromycin, Iodine, Vancomycin, Vicodin [hydrocodone-acetaminophen], Dilaudid [hydromorphone hcl], Keflex [cephalexin], Penicillins, and Sulfa antibiotics    Assessment / Plan:     Visit Diagnoses: Polyarthralgia -patient complains of pain and discomfort  in her right shoulder joint and her bilateral hip joints.  She states for her right shoulder joint pain she was seen by Dr. Dalbert Garnet who diagnosed her with rotator cuff tear.  She also complains of discomfort in her bilateral trochanteric region for the last few months.  She had autoimmune labs by Dr. Laurann Montana in September which were unremarkable.  01/30/22: Anticardiolipin antibodies negative, ANA negative, TSH 1.712, Vitamin B12 487, RPR-, dsDNA-  Raynaud's disease without gangrene -patient gives history of Raynaud's since she was in her 39s.  No sclerodactyly, nailbed capillary changes were noted.  Capillary refill was delayed.  There is no history of digital ulcers.  All autoimmune workup was negative in the past.  I will obtain additional labs today.  Plan: Protein / creatinine ratio, urine, ANA, Anti-scleroderma antibody, RNP Antibody, Anti-Smith antibody, Sjogrens syndrome-A extractable nuclear antibody, Sjogrens syndrome-B extractable nuclear antibody, Anti-DNA antibody, double-stranded, C3 and C4  Rash-she is concerned about the face show rash.  She has a rash on her face and her forehead.  It is not typical malar rash.  I will refer her to dermatology for evaluation.  Chronic right shoulder pain-patient was evaluated by Dr. Griffin Basil in the past.  Patient states that she was diagnosed with rotator cuff tear.  She still has some discomfort.  Trochanteric bursitis of both hips-she complains of discomfort in her bilateral hips which she points to trochanteric region.  She has discomfort when she walks and also sleeps on her side.  Clinical findings are consistent with trochanteric bursitis.  I discussed possible physical therapy.  Patient would like to try exercises at home.  Information regarding exercises were placed in the AVS.  Other fatigue-history of chronic fatigue.  Osteomyelitis of lumbar spine  (HCC)-patient developed osteomyelitis in her spine in 2015 after spinal injection.  Discitis of lumbar region  S/P lumbar spinal fusion - 2016 by Dr. Ronnald Ramp.  She had good relief from the surgery.  Cerebrovascular accident (CVA) due to embolism of left middle cerebral artery ([place]) patient states she had a stroke x 2.  August 2023 and September 2023.  Lupus anticoagulant, anticardiolipin antibodies and beta-2 GP 1 antibodies were negative.  She has been on Coumadin since then.  She has been under care of Dr. Marin Olp.  She has prothrombin 2 gene mutation.  Expressive aphasia-from previous CVA  Middle cerebral artery embolism, left  HFrEF (heart failure with reduced ejection fraction) (HCC)-patient states that ejection fraction improved.  Primary hypertension-blood pressure was elevated at 151/90.  Repeat blood pressure was 153/79.  She was advised to monitor blood pressure closely and follow-up with her PCP and cardiologist.  NSTEMI (non-ST elevated myocardial infarction) ([place])  Takotsubo cardiomyopathy  History of hyperlipidemia  History of anxiety  Orders: Orders Placed This Encounter  Procedures   Protein / creatinine ratio, urine   ANA   Anti-scleroderma antibody   RNP Antibody   Anti-Smith antibody   Sjogrens syndrome-A extractable nuclear antibody   Sjogrens syndrome-B extractable nuclear antibody   Anti-DNA antibody, double-stranded   C3 and C4   Ambulatory referral to Dermatology   No orders of the defined types were placed in this encounter.   Follow-Up Instructions: Return for Pain, Raynauds.   Bo Merino, MD  Note - This record has been created using Editor, commissioning.  Chart creation errors have been sought, but may not always  have been located. Such creation errors do not reflect on  the standard of medical care.
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
## 2022-07-01 ENCOUNTER — Ambulatory Visit: Payer: Medicare Other

## 2022-07-01 ENCOUNTER — Ambulatory Visit: Payer: Medicare Other | Admitting: Occupational Therapy

## 2022-07-01 DIAGNOSIS — Z0289 Encounter for other administrative examinations: Secondary | ICD-10-CM

## 2022-07-01 DIAGNOSIS — R278 Other lack of coordination: Secondary | ICD-10-CM

## 2022-07-01 DIAGNOSIS — R41841 Cognitive communication deficit: Secondary | ICD-10-CM

## 2022-07-01 DIAGNOSIS — R471 Dysarthria and anarthria: Secondary | ICD-10-CM | POA: Diagnosis not present

## 2022-07-01 DIAGNOSIS — I69351 Hemiplegia and hemiparesis following cerebral infarction affecting right dominant side: Secondary | ICD-10-CM

## 2022-07-01 DIAGNOSIS — M6281 Muscle weakness (generalized): Secondary | ICD-10-CM | POA: Diagnosis not present

## 2022-07-01 DIAGNOSIS — R208 Other disturbances of skin sensation: Secondary | ICD-10-CM

## 2022-07-01 DIAGNOSIS — R4701 Aphasia: Secondary | ICD-10-CM | POA: Diagnosis not present

## 2022-07-01 DIAGNOSIS — R482 Apraxia: Secondary | ICD-10-CM | POA: Diagnosis not present

## 2022-07-01 NOTE — Patient Instructions (Signed)
Ball exercises: Place fingers and thumb around ball and push fingers and thumb into ball Place ball in palm of hand, curve finger tips over top of ball and push/dig into ball Lay thumb on top of ball and push down through pad of thumb to get entire thumb moving Place thumb on ball and push tip of thumb into ball

## 2022-07-01 NOTE — Therapy (Signed)
OUTPATIENT OCCUPATIONAL THERAPY  Treatment Note  Patient Name: Chloe Johnson MRN: [id number] DOB:1955/09/12, 67 y.o., female Today's Date: 07/01/2022  PCP: Charlane Ferretti, MD REFERRING PROVIDER: Izora Ribas, MD    OT End of Session - 07/01/22 1413     Visit Number 29    Number of Visits 35    Date for OT Re-Evaluation 07/18/22    Authorization Type Medicare A&B    OT Start Time 1405    OT Stop Time 1445    OT Time Calculation (min) 40 min    Activity Tolerance Patient tolerated treatment well    Behavior During Therapy [facility] for tasks assessed/performed                                 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/16/2013   L3-4/notes 11/24/2013, arms, neck   Exertional asthma    GERD (gastroesophageal reflux disease)    Heart attack ([place]) 01/18/2022   Hypertension    Kidney stones    "have always passed them"   Neck pain    Osteomyelitis ([place]) 11/23/2013   osteomyelitis, discitis    Sleep concern    uses Prozac for sleep    Stroke ([place]) 01/21/2022   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; left with aphasia, dysarthria, and apraxia   Past Surgical History:  Procedure Laterality Date   BREAST CYST EXCISION Left 2010   "polypectomy"   IR CT HEAD LTD  01/21/2022   IR CT HEAD LTD  01/28/2022   IR PERCUTANEOUS ART THROMBECTOMY/INFUSION INTRACRANIAL INC DIAG ANGIO  01/21/2022   IR PERCUTANEOUS ART THROMBECTOMY/INFUSION INTRACRANIAL INC DIAG ANGIO  01/28/2022   IR US GUIDE VASC ACCESS RIGHT  01/22/2022   PICC LINE PLACE PERIPHERAL ([place] HX) Right    for use of Levaquin & Vancomycin, of note: she reports that she had a "flulike feeling"    RADIOLOGY WITH ANESTHESIA N/A 01/21/2022   Procedure: IR WITH ANESTHESIA;  Surgeon: Luanne Bras, MD;  Location: [place];  Service: Radiology;  Laterality: N/A;   RADIOLOGY WITH ANESTHESIA N/A 01/28/2022   Procedure: IR WITH ANESTHESIA;  Surgeon: Radiologist,  Medication, MD;  Location: [place];  Service: Radiology;  Laterality: N/A;   [place]   Patient Active Problem List   Diagnosis Date Noted   Aphasia 03/31/2022   Snores 03/31/2022   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   UTI (urinary tract infection) 02/04/2022   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   Stroke determined by clinical assessment ([place]) 01/28/2022   Middle cerebral artery embolism, left 01/28/2022   H/O ischemic left MCA stroke    Acute respiratory failure (HCC)    CVA (cerebral vascular accident) ([place]) 01/21/2022   NSTEMI (non-ST elevated myocardial infarction) ([place]) 01/21/2022   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   HFrEF (heart failure with reduced ejection fraction) ([place]) 01/21/2022   Hyperlipidemia 01/21/2022   Lumbar stenosis 01/21/2022   Stroke (cerebrum) ([place]) 01/21/2022   S/P lumbar spinal fusion 11/15/2014   Lumbar discitis 11/29/2013   Discitis 11/24/2013   Diarrhea 11/17/2013   Osteomyelitis of lumbar spine ([place]) 11/16/2013   Hypertension    Asthma    Anxiety     ONSET DATE: 01/28/22  REFERRING DIAG: I63.9 (ICD-10-CM) - Cerebral infarction, unspecified  THERAPY DIAG:  Hemiplegia and hemiparesis following cerebral infarction affecting right dominant side (HCC)  Muscle weakness (generalized)  Other lack of coordination  Other disturbances of skin sensation  Rationale for Evaluation and Treatment Rehabilitation  SUBJECTIVE:   SUBJECTIVE STATEMENT: Pt states something new is happening every day.   Pt accompanied by: self  PERTINENT HISTORY: PMH: HTN, cervical radiculopathy, hepatitis, osteomyelitis/discitis, lumbar stenosis with foot drop anxiety, GERD, insomnia  PRECAUTIONS: Fall  WEIGHT BEARING RESTRICTIONS  was awaiting spinal fusion surgery, limit bending, twisting, lifting >10#  PAIN:  Are you having pain? No  FALLS: Has patient fallen in last 6 months? Yes.  Number of falls 2  LIVING ENVIRONMENT: Lives with: lives with their spouse Lives in: House/apartment Stairs: Yes: Internal: full flights of steps; on right going up and External: 7 steps; on left going up Has following equipment at home: Single point cane, Walker - 2 wheeled, Walker - 4 wheeled, shower chair, and bed side commode  PLOF: Independent  PATIENT GOALS I want my arm back.    OBJECTIVE:   HAND DOMINANCE: Right  ADLs: Transfers/ambulation related to ADLs: Mod I with no use of  Eating: Husband assists with cutting, feeding self with L hand Grooming: utilizing L hand with opening and brushing teeth UB Dressing: unable to manage buttons, otherwise Mod I LB Dressing: unable to tie shoes, just slipping on toes Toileting: Mod I with use of L hand Bathing: Mod I, utilizing L hand to shave Tub Shower transfers: Mod I Equipment: Shower seat with back, Walk in shower, and bed side commode   IADLs: Light housekeeping: laundry and hanging up clothes, wipes counters/sink, needs assist to fold clothing Meal Prep: no, did not cook pre-stroke Medication management: husband opens pill bottles Handwriting: unable  MOBILITY STATUS: Needs Assist: Supervision - Mod I without use of AD  POSTURE COMMENTS:  No Significant postural limitations  ACTIVITY TOLERANCE: Activity tolerance: WNL for tasks assessed on eval  FUNCTIONAL OUTCOME MEASURES: FOTO: 49  UPPER EXTREMITY ROM     Active ROM Right eval Left eval  Shoulder flexion WNL WNL  Shoulder abduction    Shoulder adduction    Shoulder extension    Shoulder internal rotation    Shoulder external rotation    Elbow flexion    Elbow extension -5 WNL  Wrist flexion 38 WNL  Wrist extension 50 WNL  Wrist ulnar deviation    Wrist radial deviation    Wrist pronation    Wrist supination    (Blank rows = not tested)   UPPER EXTREMITY MMT:     MMT Right eval Left eval  Shoulder flexion 4+/5   Shoulder abduction     Shoulder adduction    Shoulder extension    Shoulder internal rotation    Shoulder external rotation    Middle trapezius    Lower trapezius    Elbow flexion 4+/5   Elbow extension 4+/5   Wrist flexion    Wrist extension    Wrist ulnar deviation    Wrist radial deviation    Wrist pronation    Wrist supination    (Blank rows = not tested)  HAND FUNCTION: Loose gross grasp, unable to squeeze dynamometer 06/06/22: R: 15#  COORDINATION: Box and Blocks:  Right 10 blocks, Left 53 blocks Modified performance with RUE with therapist holding blocks in palm and pt able to complete gross grasp to pick up one at a time from OT palm of hand, minimal hand opening/closing 04/02/22: Right: 16  blocks  SENSATION: Light touch: WFL Stereognosis: Impaired  Hot/Cold: Impaired  Proprioception: Impaired   COGNITION: Overall cognitive status: Within functional limits for tasks assessed  VISION: Subjective report: Pt reports that her R eye does not close as well and that she feels that her L eye is compensating for her R eye. Baseline vision: Wears glasses all the time  VISION ASSESSMENT: To be further assessed in functional context  --------------------------------------------------------------------------------------------------------------------------------------------------------------------- (objective measures above completed at initial evaluation unless otherwise dated)  TODAY'S TREATMENT:  07/01/22 Card flipping: flipping cards off of deck with R hand with focus on use of thumb flexion/extension in conjunction with finger tips. Pt demonstrating increased IP flexion and extension this session as well as incorporation of MP flexion to flip cards. OT challenged pt to slide cards off deck with use of thumb, however pt with no active MP extension to "pull back" to slide cards off deck. Ball: OT instructed in various grip and pinches with focus on thumb flexion/extension at IP and MP.  OT  providing demonstration for improved placement of ball to facilitate increased thumb movement as well as pinky flexion.  Pt demonstrating good carryover with easy squeeze ball. Pegs: Pt able to complete increased precision pinch and ability to pick up pegs from plate and even with one instance of use of long finger to rotate peg into improved position to place in peg board.  OT encouraged pt to attempt rotation of pen in hand, however too long and heavy therefore provided pt with straw cut in half.  Pt still with difficulty, therefore terminated task.    06/27/22 Wrist: engaged in rolling ball forward/backward and side to side with focus on wrist flexion/extension and ulnar/radial deviation.  Pt demonstrating increased extension this session.    Stacking cone: engaged in picking up narrow cones in R hand with focus on sustained grasp and ulnar/radial deviation when stacking cones.  Pt demonstrating loose grasp but able to grasp and manipulate to place and remove.  Pt voicing frustration with decreased grasp and tone impacting wrist mobility. Therapeutic exercise: engaged in wrist flexion/extension, ulnar/radial deviation, and supination/pronation with 1# dumbbell for further grip and ROM strengthening.  OT demonstrating exercises in gravity minimized positioning to increase ROM.   Thumb:  engaged in thumb AROM with MP to further facilitate ROM of IP.  Pt demonstrating good flexion, however unable to extend back to neutral without assistance.  Pt also completing radial adduction with thumb flexion with improved control.  Pt unable to complete any thumb flexion/extension against resistance.   Box and blocks: RUE: 28 blocks in 1 min.  Pt demonstrating improved use of pincer and use of gross grasp intermittently.      06/25/22 Grip/pinch strengthening: attempted grip strengthening with resistive ball with loops, however decreased thumb extension limited completion.  Therefore transitioned to use of Velcro  board with focus on pinch and rotation with pt completing 3 jaw chuck.  Pt demonstrating mod cues for forearm to compensate for decreased  Finger isolation: engaged in isolated finger extension with pt able to demonstrate min extension with each digit, except thumb.  Pt demonstrating thumb abduction/adduction but no extension.  Reiterated isolated movements in clasped hand position, on table top, and with blocking as needed. Forearm stretch: engaged in supination with AAROM followed by hold in supination to increase stretch.  Pt reports unable to maintain position, however able to tolerate stretch. Hand Gripper: with RUE on level 10# with green spring. Pt picked up 1 inch blocks  with gripper with 2 drops and little to no difficulty. OT increased resistance to 15#.  Pt fatiguing halfway through due to increased demand.  Pt then able to pick up blocks and transfer into container without hand gripper.  Pt demonstrating improved coordination and motor control when picking up and stacking blocks.   PATIENT EDUCATION: Education details: ongoing education with focus on coordination and progressive ROM Person educated: Patient Education method: Explanation, Demonstration, and Verbal cues Education comprehension: verbalized understanding and needs further education   HOME EXERCISE PROGRAM: Fine motor coordination handout  Access Code: [email or web address] URL: [email or web address] Date: 06/27/2022 Prepared by: [facility]  Exercises - Seated Digit Tendon Gliding  - 2 x daily - 1 sets - 10 reps - Thumb AROM MP Blocking  - 2 x daily - 2 sets - 10 reps - Thumb Radial Adduction with Thumb Flexion AROM on Table  - 2 x daily - 2 sets - 10 reps - Wrist Prayer Stretch  - 2 x daily - 2 sets - 10 reps - 5-10 sec hold - Wrist Flexion and Extension with Resistance Bar  - 1 x daily - 3 x weekly - 2 sets - 10 reps - Wrist Extension with Resistance  - 1 x daily - 3 x weekly  - 2 sets - 5 reps - 3 sec hold - Wrist Flexion with Resistance  - 1 x daily - 3 x weekly - 2 sets - 5 reps - 3 sec hold - Seated Wrist Radial Deviation with Dumbbell  - 1 x daily - 3 x weekly - 2 sets - 10 reps - Seated Wrist Extension with Dumbbell  - 1 x daily - 3 x weekly - 2 sets - 10 reps - Forearm Supination with Dumbbell  - 1 x daily - 3 x weekly - 2 sets - 10 reps      GOALS: Goals reviewed with patient? Yes  SHORT TERM GOALS: Target date: 06/27/22   Pt will be independent with advanced HEP for fine and gross motor control.  Baseline:  Goal status: Met - 06/27/22  2.   Pt will demonstrate improved UE functional use for ADLs as evidenced by increasing box/ blocks score by 4 blocks with RUE.  Baseline: R: 20 blocks and L: 53  Goal status: Met - 28 blocks on R on 06/27/22  3.   Pt will demonstrate improved wrist mobility to allow for increased ease and independence with combing/brushing hair.  Baseline:  Goal status: Met - 06/27/22   LONG TERM GOALS: Target date: 07/18/22  Pt will demonstrate increased hand function and grasp to open personal medication bottles (including child proof) and dispense meds with <2 drops of medication. Baseline:  Goal status: IN PROGRESS  2.  Pt will demonstrate use of BUE together to engage in functional IADL tasks (simple meal prep, folding laundry, etc). Baseline:  Goal status: IN PROGRESS  3.  Pt will demonstrate increased functional reach and grasp to obtain or place light weight item from/on moderate height shelf to simulate IADLs. Baseline:  Goal status: IN PROGRESS  4. Pt will increase RUE grip strength by 5# to increase success with holding and manipulating dishes and pots/pain for cooking.  Baseline: 15#  Goal status: IN PROGRESS  5.  Pt will be able to complete 9 hole peg test with RUE in 2 min time limit to demonstrate increased fine motor control as needed for ADLs/IADLs.  Baseline:   Goal status:  IN PROGRESS  6.  Pt will  demonstrate spasticity management strategies.  Baseline:  Goal status: IN PROGRESS   ASSESSMENT:  CLINICAL IMPRESSION: Pt continues to demonstrate improved grasp and coordination during structured and functional tasks.  Pt pleased with increased thumb mobility this session, with ability to complete IP flexion and extension.  Pt able to incorporate thumb into flipping of cards and exercises with pushing into squeeze ball.  Pt appreciative of additional exercises to facilitate thumb and grasp strengthening.    PERFORMANCE DEFICITS in functional skills including ADLs, IADLs, coordination, dexterity, sensation, ROM, strength, FMC, GMC, balance, body mechanics, decreased knowledge of use of DME, and UE functional use and psychosocial skills including environmental adaptation, habits, and routines and behaviors.   IMPAIRMENTS are limiting patient from ADLs, IADLs, and work.   COMORBIDITIES may have co-morbidities  that affects occupational performance. Patient will benefit from skilled OT to address above impairments and improve overall function.  MODIFICATION OR ASSISTANCE TO COMPLETE EVALUATION: Min-Moderate modification of tasks or assist with assess necessary to complete an evaluation.  OT OCCUPATIONAL PROFILE AND HISTORY: Detailed assessment: Review of records and additional review of physical, cognitive, psychosocial history related to current functional performance.  CLINICAL DECISION MAKING: Moderate - several treatment options, min-mod task modification necessary  REHAB POTENTIAL: Good  EVALUATION COMPLEXITY: Moderate    PLAN: OT FREQUENCY: 2x/week  OT DURATION: 6 weeks  PLANNED INTERVENTIONS: self care/ADL training, therapeutic exercise, therapeutic activity, neuromuscular re-education, manual therapy, passive range of motion, balance training, functional mobility training, splinting, electrical stimulation, ultrasound, compression bandaging, moist heat, cryotherapy,  patient/family education, psychosocial skills training, energy conservation, coping strategies training, and DME and/or AE instructions  RECOMMENDED OTHER SERVICES: N/A  CONSULTED AND AGREED WITH PLAN OF CARE: Patient and family member/caregiver  PLAN FOR NEXT SESSION: Engage in wrist, thumb ROM. Attempt thumb extension.  WB through RUE.  Gross grasp and reach activities.  Massage (as appropriate) and tone management.   Rosalio Loud, OTR/L 07/01/2022, 2:14 PM
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
## 2022-07-02 DIAGNOSIS — N3941 Urge incontinence: Secondary | ICD-10-CM | POA: Diagnosis not present

## 2022-07-02 DIAGNOSIS — R350 Frequency of micturition: Secondary | ICD-10-CM | POA: Diagnosis not present

## 2022-07-03 ENCOUNTER — Ambulatory Visit: Payer: Medicare Other

## 2022-07-03 ENCOUNTER — Ambulatory Visit: Payer: Medicare Other | Admitting: Occupational Therapy

## 2022-07-03 DIAGNOSIS — R208 Other disturbances of skin sensation: Secondary | ICD-10-CM

## 2022-07-03 DIAGNOSIS — R41841 Cognitive communication deficit: Secondary | ICD-10-CM | POA: Diagnosis not present

## 2022-07-03 DIAGNOSIS — R278 Other lack of coordination: Secondary | ICD-10-CM

## 2022-07-03 DIAGNOSIS — M6281 Muscle weakness (generalized): Secondary | ICD-10-CM

## 2022-07-03 DIAGNOSIS — R482 Apraxia: Secondary | ICD-10-CM | POA: Diagnosis not present

## 2022-07-03 DIAGNOSIS — I69351 Hemiplegia and hemiparesis following cerebral infarction affecting right dominant side: Secondary | ICD-10-CM | POA: Diagnosis not present

## 2022-07-03 DIAGNOSIS — R4701 Aphasia: Secondary | ICD-10-CM | POA: Diagnosis not present

## 2022-07-03 DIAGNOSIS — R471 Dysarthria and anarthria: Secondary | ICD-10-CM | POA: Diagnosis not present

## 2022-07-03 NOTE — Therapy (Signed)
OUTPATIENT OCCUPATIONAL THERAPY  Treatment Note & Progress Note  Patient Name: Chloe Johnson MRN: [id number] DOB:09/20/1955, 67 y.o., female Today's Date: 07/03/2022  PCP: Charlane Ferretti, MD REFERRING PROVIDER: Izora Ribas, MD   Occupational Therapy Progress Note  Dates of Reporting Period: 05/28/22 to 07/03/22  Objective Reports of Subjective Statement: Pt is demonstrating improvements in grasp strength, wrist flexion/extension, thumb MP and IP flexion and extension allowing for increased functional use of dominant RUE.  Pt continues to demonstrate decreased sensation and [place] with picking up coins from table.  Plan: continue per POC with plan to reassess frequency vs d/c at end of cert to maximize visits according to need and visit limit  Reason Skilled Services are Required: impaired sensation, decreased fine and gross motor coordination of dominant RUE impacting ease and independence with ADLs/IADLs.    OT End of Session - 07/03/22 1548     Visit Number 30    Number of Visits 35    Date for OT Re-Evaluation 07/18/22    Authorization Type Medicare A&B    OT Start Time 1103    OT Stop Time 1145    OT Time Calculation (min) 42 min    Activity Tolerance Patient tolerated treatment well    Behavior During Therapy WFL for tasks assessed/performed                                  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/16/2013   L3-4/notes 11/24/2013, arms, neck   Exertional asthma    GERD (gastroesophageal reflux disease)    Heart attack ([place]) 01/18/2022   Hypertension    Kidney stones    "have always passed them"   Neck pain    Osteomyelitis ([place]) 11/23/2013   osteomyelitis, discitis    Sleep concern    uses Prozac for sleep    Stroke ([place]) 01/21/2022   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; left with aphasia, dysarthria, and apraxia   Past Surgical History:  Procedure Laterality Date   BREAST  CYST EXCISION Left 2010   "polypectomy"   IR CT HEAD LTD  01/21/2022   IR CT HEAD LTD  01/28/2022   IR PERCUTANEOUS ART THROMBECTOMY/INFUSION INTRACRANIAL INC DIAG ANGIO  01/21/2022   IR PERCUTANEOUS ART THROMBECTOMY/INFUSION INTRACRANIAL INC DIAG ANGIO  01/28/2022   IR US GUIDE VASC ACCESS RIGHT  01/22/2022   PICC LINE PLACE PERIPHERAL ([place] HX) Right    for use of Levaquin & Vancomycin, of note: she reports that she had a "flulike feeling"    RADIOLOGY WITH ANESTHESIA N/A 01/21/2022   Procedure: IR WITH ANESTHESIA;  Surgeon: Luanne Bras, MD;  Location: [facility];  Service: Radiology;  Laterality: N/A;   RADIOLOGY WITH ANESTHESIA N/A 01/28/2022   Procedure: IR WITH ANESTHESIA;  Surgeon: Radiologist, Medication, MD;  Location: [place];  Service: Radiology;  Laterality: N/A;   TONSILLECTOMY AND ADENOIDECTOMY  [place]   Patient Active Problem List   Diagnosis Date Noted   Aphasia 03/31/2022   Snores 03/31/2022   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   UTI (urinary tract infection) 02/04/2022   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   Stroke determined by clinical assessment ([place]) 01/28/2022   Middle cerebral artery embolism, left 01/28/2022   H/O ischemic left MCA stroke    Acute respiratory  failure [facility]    CVA (cerebral vascular accident) ([place]) 01/21/2022   NSTEMI (non-ST elevated myocardial infarction) ([place]) 01/21/2022   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   HFrEF (heart failure with reduced ejection fraction) ([place]) 01/21/2022   Hyperlipidemia 01/21/2022   Lumbar stenosis 01/21/2022   Stroke (cerebrum) ([place]) 01/21/2022   S/P lumbar spinal fusion 11/15/2014   Lumbar discitis 11/29/2013   Discitis 11/24/2013   Diarrhea 11/17/2013   Osteomyelitis of lumbar spine ([place]) 11/16/2013   Hypertension    Asthma    Anxiety     ONSET DATE: 01/28/22  REFERRING DIAG: I63.9 (ICD-10-CM) - Cerebral infarction, unspecified   THERAPY DIAG:  Hemiplegia and  hemiparesis following cerebral infarction affecting right dominant side (HCC)  Muscle weakness (generalized)  Other lack of coordination  Other disturbances of skin sensation  Rationale for Evaluation and Treatment Rehabilitation  SUBJECTIVE:   SUBJECTIVE STATEMENT: Pt reports she brought a toy with her to show therapist.  Pt accompanied by: self  PERTINENT HISTORY: PMH: HTN, cervical radiculopathy, hepatitis, osteomyelitis/discitis, lumbar stenosis with foot drop anxiety, GERD, insomnia  PRECAUTIONS: Fall  WEIGHT BEARING RESTRICTIONS  was awaiting spinal fusion surgery, limit bending, twisting, lifting >10#  PAIN:  Are you having pain? No  FALLS: Has patient fallen in last 6 months? Yes. Number of falls 2  LIVING ENVIRONMENT: Lives with: lives with their spouse Lives in: House/apartment Stairs: Yes: Internal: full flights of steps; on right going up and External: 7 steps; on left going up Has following equipment at home: Single point cane, Walker - 2 wheeled, Walker - 4 wheeled, shower chair, and bed side commode  PLOF: Independent  PATIENT GOALS I want my arm back.    OBJECTIVE:   HAND DOMINANCE: Right  ADLs: Transfers/ambulation related to ADLs: Mod I with no use of  Eating: Husband assists with cutting, feeding self with L hand Grooming: utilizing L hand with opening and brushing teeth UB Dressing: unable to manage buttons, otherwise Mod I LB Dressing: unable to tie shoes, just slipping on toes Toileting: Mod I with use of L hand Bathing: Mod I, utilizing L hand to shave Tub Shower transfers: Mod I Equipment: Shower seat with back, Walk in shower, and bed side commode   IADLs: Light housekeeping: laundry and hanging up clothes, wipes counters/sink, needs assist to fold clothing Meal Prep: no, did not cook pre-stroke Medication management: husband opens pill bottles Handwriting: unable  MOBILITY STATUS: Needs Assist: Supervision - Mod I without use of  AD  POSTURE COMMENTS:  No Significant postural limitations  ACTIVITY TOLERANCE: Activity tolerance: WNL for tasks assessed on eval  FUNCTIONAL OUTCOME MEASURES: FOTO: 49  UPPER EXTREMITY ROM     Active ROM Right eval Left eval  Shoulder flexion WNL WNL  Shoulder abduction    Shoulder adduction    Shoulder extension    Shoulder internal rotation    Shoulder external rotation    Elbow flexion    Elbow extension -5 WNL  Wrist flexion 38 WNL  Wrist extension 50 WNL  Wrist ulnar deviation    Wrist radial deviation    Wrist pronation    Wrist supination    (Blank rows = not tested)   UPPER EXTREMITY MMT:     MMT Right eval Left eval  Shoulder flexion 4+/5   Shoulder abduction    Shoulder adduction    Shoulder extension    Shoulder internal rotation    Shoulder external rotation    Middle trapezius  Lower trapezius    Elbow flexion 4+/5   Elbow extension 4+/5   Wrist flexion    Wrist extension    Wrist ulnar deviation    Wrist radial deviation    Wrist pronation    Wrist supination    (Blank rows = not tested)  HAND FUNCTION: Loose gross grasp, unable to squeeze dynamometer 06/06/22: R: 15#  COORDINATION: Box and Blocks:  Right 10 blocks, Left 53 blocks Modified performance with RUE with therapist holding blocks in palm and pt able to complete gross grasp to pick up one at a time from OT palm of hand, minimal hand opening/closing 04/02/22: Right: 16 blocks  SENSATION: Light touch: WFL Stereognosis: Impaired  Hot/Cold: Impaired  Proprioception: Impaired   COGNITION: Overall cognitive status: Within functional limits for tasks assessed  VISION: Subjective report: Pt reports that her R eye does not close as well and that she feels that her L eye is compensating for her R eye. Baseline vision: Wears glasses all the time  VISION ASSESSMENT: To be further assessed in functional  context  --------------------------------------------------------------------------------------------------------------------------------------------------------------------- (objective measures above completed at initial evaluation unless otherwise dated)  TODAY'S TREATMENT:  07/03/22 Hand grip: pt brought in ball with finger loops that she purchased.  Pt asking questions about completion of exercises from previous session with this ball.  OT providing demonstration of full flexion/extension of thumb at IP and MP.  Ball allowing for good flexion.  Pt demonstrating mild difficulty with MP flexion, requiring hand over hand for increased technique. Medication management: Completed pill box assessment in 6:50 due to decreased use of RUE.  Pt able to utilize RUE to hold pill bottles while opening with LUE, pouring pills into L hand, and turning lids of pill bottles requiring push on tab as unable to complete pushing down with R thumb.   Built up hand: OT provided pt with built up/red foam handle and educated on placement on utensils and pens to increase functional use of loose grasp to allow for increased use of dominant RUE with self-feeding and to begin resuming handwriting.  Pt wrote 3 word sentence with RUE with built up handle, with 75% legibility but pt very pleased with ability to write with dominant hand. OT encouraged pt to start with coloring and writing large letters and shapes to focus on motor control and grip strength.    07/01/22 Card flipping: flipping cards off of deck with R hand with focus on use of thumb flexion/extension in conjunction with finger tips. Pt demonstrating increased IP flexion and extension this session as well as incorporation of MP flexion to flip cards. OT challenged pt to slide cards off deck with use of thumb, however pt with no active MP extension to "pull back" to slide cards off deck. Ball: OT instructed in various grip and pinches with focus on thumb  flexion/extension at IP and MP.  OT providing demonstration for improved placement of ball to facilitate increased thumb movement as well as pinky flexion.  Pt demonstrating good carryover with easy squeeze ball. Pegs: Pt able to complete increased precision pinch and ability to pick up pegs from plate and even with one instance of use of long finger to rotate peg into improved position to place in peg board.  OT encouraged pt to attempt rotation of pen in hand, however too long and heavy therefore provided pt with straw cut in half.  Pt still with difficulty, therefore terminated task.    06/27/22 Wrist: engaged in rolling ball  forward/backward and side to side with focus on wrist flexion/extension and ulnar/radial deviation.  Pt demonstrating increased extension this session.    Stacking cone: engaged in picking up narrow cones in R hand with focus on sustained grasp and ulnar/radial deviation when stacking cones.  Pt demonstrating loose grasp but able to grasp and manipulate to place and remove.  Pt voicing frustration with decreased grasp and tone impacting wrist mobility. Therapeutic exercise: engaged in wrist flexion/extension, ulnar/radial deviation, and supination/pronation with 1# dumbbell for further grip and ROM strengthening.  OT demonstrating exercises in gravity minimized positioning to increase ROM.   Thumb:  engaged in thumb AROM with MP to further facilitate ROM of IP.  Pt demonstrating good flexion, however unable to extend back to neutral without assistance.  Pt also completing radial adduction with thumb flexion with improved control.  Pt unable to complete any thumb flexion/extension against resistance.   Box and blocks: RUE: 28 blocks in 1 min.  Pt demonstrating improved use of pincer and use of gross grasp intermittently.     PATIENT EDUCATION: Education details: ongoing education with focus on coordination and progressive ROM Person educated: Patient Education method:  Explanation, Demonstration, and Verbal cues Education comprehension: verbalized understanding and needs further education   HOME EXERCISE PROGRAM: Fine motor coordination handout  Access Code: [email or web address] URL: [email or web address] Date: 06/27/2022 Prepared by: [facility] - Outpatient  Rehab - [facility]  Exercises - Seated Digit Tendon Gliding  - 2 x daily - 1 sets - 10 reps - Thumb AROM MP Blocking  - 2 x daily - 2 sets - 10 reps - Thumb Radial Adduction with Thumb Flexion AROM on Table  - 2 x daily - 2 sets - 10 reps - Wrist Prayer Stretch  - 2 x daily - 2 sets - 10 reps - 5-10 sec hold - Wrist Flexion and Extension with Resistance Bar  - 1 x daily - 3 x weekly - 2 sets - 10 reps - Wrist Extension with Resistance  - 1 x daily - 3 x weekly - 2 sets - 5 reps - 3 sec hold - Wrist Flexion with Resistance  - 1 x daily - 3 x weekly - 2 sets - 5 reps - 3 sec hold - Seated Wrist Radial Deviation with Dumbbell  - 1 x daily - 3 x weekly - 2 sets - 10 reps - Seated Wrist Extension with Dumbbell  - 1 x daily - 3 x weekly - 2 sets - 10 reps - Forearm Supination with Dumbbell  - 1 x daily - 3 x weekly - 2 sets - 10 reps      GOALS: Goals reviewed with patient? Yes  SHORT TERM GOALS: Target date: 06/27/22   Pt will be independent with advanced HEP for fine and gross motor control.  Baseline:  Goal status: Met - 06/27/22  2.   Pt will demonstrate improved UE functional use for ADLs as evidenced by increasing box/ blocks score by 4 blocks with RUE.  Baseline: R: 20 blocks and L: 53  Goal status: Met - 28 blocks on R on 06/27/22  3.   Pt will demonstrate improved wrist mobility to allow for increased ease and independence with combing/brushing hair.  Baseline:  Goal status: Met - 06/27/22   LONG TERM GOALS: Target date: 07/18/22  Pt will demonstrate increased hand function and grasp to open personal medication bottles (including child proof) and dispense meds with <2 drops of  medication. Baseline:  Goal  status: MET  - 07/03/22  2.  Pt will demonstrate use of BUE together to engage in functional IADL tasks (simple meal prep, folding laundry, etc). Baseline:  Goal status: IN PROGRESS  3.  Pt will demonstrate increased functional reach and grasp to obtain or place light weight item from/on moderate height shelf to simulate IADLs. Baseline:  Goal status: IN PROGRESS  4. Pt will increase RUE grip strength by 5# to increase success with holding and manipulating dishes and pots/pain for cooking.  Baseline: 15#  Goal status: IN PROGRESS  5.  Pt will be able to complete 9 hole peg test with RUE in 2 min time limit to demonstrate increased fine motor control as needed for ADLs/IADLs.  Baseline:   Goal status: IN PROGRESS  6.  Pt will demonstrate spasticity management strategies.  Baseline:  Goal status: IN PROGRESS   ASSESSMENT:  CLINICAL IMPRESSION: Pt continues to demonstrate improved grasp and coordination during structured and functional tasks.  Pt pleased with increased thumb mobility this session, allowing for increased functional use with medication management with opening pill bottles and maintaining tripod grasp on built up handle.  Pt appreciative of introduction to built up handle/foam handle to allow for improved functional use of dominant RUE during self-feeding and handwriting attempts.  PERFORMANCE DEFICITS in functional skills including ADLs, IADLs, coordination, dexterity, sensation, ROM, strength, FMC, GMC, balance, body mechanics, decreased knowledge of use of DME, and UE functional use and psychosocial skills including environmental adaptation, habits, and routines and behaviors.   IMPAIRMENTS are limiting patient from ADLs, IADLs, and work.   COMORBIDITIES may have co-morbidities  that affects occupational performance. Patient will benefit from skilled OT to address above impairments and improve overall function.  MODIFICATION OR ASSISTANCE TO  COMPLETE EVALUATION: Min-Moderate modification of tasks or assist with assess necessary to complete an evaluation.  OT OCCUPATIONAL PROFILE AND HISTORY: Detailed assessment: Review of records and additional review of physical, cognitive, psychosocial history related to current functional performance.  CLINICAL DECISION MAKING: Moderate - several treatment options, min-mod task modification necessary  REHAB POTENTIAL: Good  EVALUATION COMPLEXITY: Moderate    PLAN: OT FREQUENCY: 2x/week  OT DURATION: 6 weeks  PLANNED INTERVENTIONS: self care/ADL training, therapeutic exercise, therapeutic activity, neuromuscular re-education, manual therapy, passive range of motion, balance training, functional mobility training, splinting, electrical stimulation, ultrasound, compression bandaging, moist heat, cryotherapy, patient/family education, psychosocial skills training, energy conservation, coping strategies training, and DME and/or AE instructions  RECOMMENDED OTHER SERVICES: N/A  CONSULTED AND AGREED WITH PLAN OF CARE: Patient and family member/caregiver  PLAN FOR NEXT SESSION: Engage in wrist, thumb ROM. Attempt thumb extension.  WB through [place].  Gross grasp and reach activities.  [place] with built up handle, handwriting.  Massage (as appropriate) and tone management.   Simonne Come, OTR/L 07/03/2022, 3:50 PM
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
## 2022-07-08 ENCOUNTER — Telehealth: Payer: Self-pay | Admitting: Neurology

## 2022-07-08 ENCOUNTER — Telehealth: Payer: Self-pay | Admitting: *Deleted

## 2022-07-08 ENCOUNTER — Ambulatory Visit: Payer: Medicare Other | Admitting: Occupational Therapy

## 2022-07-08 DIAGNOSIS — R482 Apraxia: Secondary | ICD-10-CM | POA: Diagnosis not present

## 2022-07-08 DIAGNOSIS — R471 Dysarthria and anarthria: Secondary | ICD-10-CM | POA: Diagnosis not present

## 2022-07-08 DIAGNOSIS — M6281 Muscle weakness (generalized): Secondary | ICD-10-CM

## 2022-07-08 DIAGNOSIS — R208 Other disturbances of skin sensation: Secondary | ICD-10-CM | POA: Diagnosis not present

## 2022-07-08 DIAGNOSIS — R278 Other lack of coordination: Secondary | ICD-10-CM

## 2022-07-08 DIAGNOSIS — R4701 Aphasia: Secondary | ICD-10-CM | POA: Diagnosis not present

## 2022-07-08 DIAGNOSIS — I69351 Hemiplegia and hemiparesis following cerebral infarction affecting right dominant side: Secondary | ICD-10-CM

## 2022-07-08 DIAGNOSIS — R41841 Cognitive communication deficit: Secondary | ICD-10-CM | POA: Diagnosis not present

## 2022-07-08 NOTE — Therapy (Signed)
OUTPATIENT OCCUPATIONAL THERAPY  Treatment Note & Progress Note  Patient Name: Chloe Johnson MRN: [id number] DOB:05/27/1955, 67 y.o., female Today's Date: 07/08/2022  PCP: Charlane Ferretti, MD REFERRING PROVIDER: Izora Ribas, MD      OT End of Session - 07/08/22 0856     Visit Number 31    Number of Visits 35    Date for OT Re-Evaluation 07/18/22    Authorization Type Medicare A&B    OT Start Time 0848    OT Stop Time 0930    OT Time Calculation (min) 42 min    Activity Tolerance Patient tolerated treatment well    Behavior During Therapy [facility] for tasks assessed/performed                                   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/16/2013   L3-4/notes 11/24/2013, arms, neck   Exertional asthma    GERD (gastroesophageal reflux disease)    Heart attack ([place]) 01/18/2022   Hypertension    Kidney stones    "have always passed them"   Neck pain    Osteomyelitis ([place]) 11/23/2013   osteomyelitis, discitis    Sleep concern    uses Prozac for sleep    Stroke ([place]) 01/21/2022   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; left with aphasia, dysarthria, and apraxia   Past Surgical History:  Procedure Laterality Date   BREAST CYST EXCISION Left 2010   "polypectomy"   IR CT HEAD LTD  01/21/2022   IR CT HEAD LTD  01/28/2022   IR PERCUTANEOUS ART THROMBECTOMY/INFUSION INTRACRANIAL INC DIAG ANGIO  01/21/2022   IR PERCUTANEOUS ART THROMBECTOMY/INFUSION INTRACRANIAL INC DIAG ANGIO  01/28/2022   IR US GUIDE VASC ACCESS RIGHT  01/22/2022   PICC LINE PLACE PERIPHERAL ([name] HX) Right    for use of Levaquin & Vancomycin, of note: she reports that she had a "flulike feeling"    RADIOLOGY WITH ANESTHESIA N/A 01/21/2022   Procedure: IR WITH ANESTHESIA;  Surgeon: Luanne Bras, MD;  Location: [place];  Service: Radiology;  Laterality: N/A;   RADIOLOGY WITH ANESTHESIA N/A 01/28/2022   Procedure: IR WITH ANESTHESIA;   Surgeon: Radiologist, Medication, MD;  Location: [place];  Service: Radiology;  Laterality: N/A;   [place]   Patient Active Problem List   Diagnosis Date Noted   Aphasia 03/31/2022   Snores 03/31/2022   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   UTI (urinary tract infection) 02/04/2022   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   Stroke determined by clinical assessment ([place]) 01/28/2022   Middle cerebral artery embolism, left 01/28/2022   H/O ischemic left MCA stroke    Acute respiratory failure (HCC)    CVA (cerebral vascular accident) ([place]) 01/21/2022   NSTEMI (non-ST elevated myocardial infarction) ([place]) 01/21/2022   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   HFrEF (heart failure with reduced ejection fraction) ([place]) 01/21/2022   Hyperlipidemia 01/21/2022   Lumbar stenosis 01/21/2022   Stroke (cerebrum) ([place]) 01/21/2022   S/P lumbar spinal fusion 11/15/2014   Lumbar discitis 11/29/2013   Discitis 11/24/2013   Diarrhea 11/17/2013   Osteomyelitis of lumbar spine ([place]) 11/16/2013   Hypertension    Asthma    Anxiety     ONSET DATE: 01/28/22  REFERRING DIAG: I63.9 (  ICD-10-CM) - Cerebral infarction, unspecified   THERAPY DIAG:  Hemiplegia and hemiparesis following cerebral infarction affecting right dominant side (HCC)  Muscle weakness (generalized)  Other lack of coordination  Other disturbances of skin sensation  Rationale for Evaluation and Treatment Rehabilitation  SUBJECTIVE:   SUBJECTIVE STATEMENT: Pt reports she found some utensils that the built up foam handle worked with and it was nice to be able to eat in typical pattern.  Pt accompanied by: self  PERTINENT HISTORY: PMH: HTN, cervical radiculopathy, hepatitis, osteomyelitis/discitis, lumbar stenosis with foot drop anxiety, GERD, insomnia  PRECAUTIONS: Fall  WEIGHT BEARING RESTRICTIONS  was awaiting spinal fusion surgery, limit bending,  twisting, lifting >10#  PAIN:  Are you having pain? No  FALLS: Has patient fallen in last 6 months? Yes. Number of falls 2  LIVING ENVIRONMENT: Lives with: lives with their spouse Lives in: House/apartment Stairs: Yes: Internal: full flights of steps; on right going up and External: 7 steps; on left going up Has following equipment at home: Single point cane, Walker - 2 wheeled, Walker - 4 wheeled, shower chair, and bed side commode  PLOF: Independent  PATIENT GOALS I want my arm back.    OBJECTIVE:   HAND DOMINANCE: Right  ADLs: Transfers/ambulation related to ADLs: Mod I with no use of  Eating: Husband assists with cutting, feeding self with L hand Grooming: utilizing L hand with opening and brushing teeth UB Dressing: unable to manage buttons, otherwise Mod I LB Dressing: unable to tie shoes, just slipping on toes Toileting: Mod I with use of L hand Bathing: Mod I, utilizing L hand to shave Tub Shower transfers: Mod I Equipment: Shower seat with back, Walk in shower, and bed side commode   IADLs: Light housekeeping: laundry and hanging up clothes, wipes counters/sink, needs assist to fold clothing Meal Prep: no, did not cook pre-stroke Medication management: husband opens pill bottles Handwriting: unable  MOBILITY STATUS: Needs Assist: Supervision - Mod I without use of AD  POSTURE COMMENTS:  No Significant postural limitations  ACTIVITY TOLERANCE: Activity tolerance: WNL for tasks assessed on eval  FUNCTIONAL OUTCOME MEASURES: FOTO: 49  UPPER EXTREMITY ROM     Active ROM Right eval Left eval  Shoulder flexion WNL WNL  Shoulder abduction    Shoulder adduction    Shoulder extension    Shoulder internal rotation    Shoulder external rotation    Elbow flexion    Elbow extension -5 WNL  Wrist flexion 38 WNL  Wrist extension 50 WNL  Wrist ulnar deviation    Wrist radial deviation    Wrist pronation    Wrist supination    (Blank rows = not  tested)   UPPER EXTREMITY MMT:     MMT Right eval Left eval  Shoulder flexion 4+/5   Shoulder abduction    Shoulder adduction    Shoulder extension    Shoulder internal rotation    Shoulder external rotation    Middle trapezius    Lower trapezius    Elbow flexion 4+/5   Elbow extension 4+/5   Wrist flexion    Wrist extension    Wrist ulnar deviation    Wrist radial deviation    Wrist pronation    Wrist supination    (Blank rows = not tested)  HAND FUNCTION: Loose gross grasp, unable to squeeze dynamometer 06/06/22: R: 15#  COORDINATION: Box and Blocks:  Right 10 blocks, Left 53 blocks Modified performance with RUE with therapist holding blocks in palm and  pt able to complete gross grasp to pick up one at a time from OT palm of hand, minimal hand opening/closing 04/02/22: Right: 16 blocks  SENSATION: Light touch: WFL Stereognosis: Impaired  Hot/Cold: Impaired  Proprioception: Impaired   COGNITION: Overall cognitive status: Within functional limits for tasks assessed  VISION: Subjective report: Pt reports that her R eye does not close as well and that she feels that her L eye is compensating for her R eye. Baseline vision: Wears glasses all the time  VISION ASSESSMENT: To be further assessed in functional context  --------------------------------------------------------------------------------------------------------------------------------------------------------------------- (objective measures above completed at initial evaluation unless otherwise dated)  TODAY'S TREATMENT:  07/08/22 Coordination: picking up medium sized pegs with RUE with focus on precision pinch and placing in peg board with improved use of precision pinch.  Pt attempting to rotate pegs 180* with ability to complete 90-120* rotation and reporting increased tightness in fingers, limiting movement, with repetition.   Thumb flexion/extension: focus on thumb flexion at IP and Steele joint with easy  squeeze ball.  OT providing demonstration for improved placement of ball to facilitate increased thumb flexion.  OT then challenging CMC extension with resistance loop.  Pt able to complete CMC extension with mod difficulty, however unable to extend at IP joint. Wrist extension/flexion: with stacking/unstacking large grip legos. Transitioned to wrist flexion/extension with 1# dumbbell.  Pt demonstrating decreased supination to allow for full wrist flexion, therefore repositioned arm to allow for increased ROM. Forearm supination/pronation: complete with 1# dumbbell.  Pt with decreased supination, lacking ~20* from full supinated position.    07/03/22 Hand grip: pt brought in ball with finger loops that she purchased.  Pt asking questions about completion of exercises from previous session with this ball.  OT providing demonstration of full flexion/extension of thumb at IP and MP.  Ball allowing for good flexion.  Pt demonstrating mild difficulty with MP flexion, requiring hand over hand for increased technique. Medication management: Completed pill box assessment in 6:50 due to decreased use of RUE.  Pt able to utilize RUE to hold pill bottles while opening with LUE, pouring pills into L hand, and turning lids of pill bottles requiring push on tab as unable to complete pushing down with R thumb.   Built up handle: OT provided pt with built up/red foam handle and educated on placement on utensils and pens to increase functional use of loose grasp to allow for increased use of dominant RUE with self-feeding and to begin resuming handwriting.  Pt wrote 3 word sentence with RUE with built up handle, with 75% legibility but pt very pleased with ability to write with dominant hand. OT encouraged pt to start with coloring and writing large letters and shapes to focus on motor control and grip strength.    07/01/22 Card flipping: flipping cards off of deck with R hand with focus on use of thumb flexion/extension  in conjunction with finger tips. Pt demonstrating increased IP flexion and extension this session as well as incorporation of MP flexion to flip cards. OT challenged pt to slide cards off deck with use of thumb, however pt with no active MP extension to "pull back" to slide cards off deck. Ball: OT instructed in various grip and pinches with focus on thumb flexion/extension at IP and MP.  OT providing demonstration for improved placement of ball to facilitate increased thumb movement as well as pinky flexion.  Pt demonstrating good carryover with easy squeeze ball. Pegs: Pt able to complete increased precision pinch  and ability to pick up pegs from plate and even with one instance of use of long finger to rotate peg into improved position to place in peg board.  OT encouraged pt to attempt rotation of pen in hand, however too long and heavy therefore provided pt with straw cut in half.  Pt still with difficulty, therefore terminated task.   PATIENT EDUCATION: Education details: ongoing education with focus on coordination and progressive ROM Person educated: Patient Education method: Explanation, Demonstration, and Verbal cues Education comprehension: verbalized understanding and needs further education   HOME EXERCISE PROGRAM: Fine motor coordination handout  Access Code: [email or web address] URL: [email or web address] Date: 06/27/2022 Prepared by: [facility]  Exercises - Seated Digit Tendon Gliding  - 2 x daily - 1 sets - 10 reps - Thumb AROM MP Blocking  - 2 x daily - 2 sets - 10 reps - Thumb Radial Adduction with Thumb Flexion AROM on Table  - 2 x daily - 2 sets - 10 reps - Wrist Prayer Stretch  - 2 x daily - 2 sets - 10 reps - 5-10 sec hold - Wrist Flexion and Extension with Resistance Bar  - 1 x daily - 3 x weekly - 2 sets - 10 reps - Wrist Extension with Resistance  - 1 x daily - 3 x weekly - 2 sets - 5 reps - 3 sec hold - Wrist Flexion with  Resistance  - 1 x daily - 3 x weekly - 2 sets - 5 reps - 3 sec hold - Seated Wrist Radial Deviation with Dumbbell  - 1 x daily - 3 x weekly - 2 sets - 10 reps - Seated Wrist Extension with Dumbbell  - 1 x daily - 3 x weekly - 2 sets - 10 reps - Forearm Supination with Dumbbell  - 1 x daily - 3 x weekly - 2 sets - 10 reps      GOALS: Goals reviewed with patient? Yes  SHORT TERM GOALS: Target date: 06/27/22   Pt will be independent with advanced HEP for fine and gross motor control.  Baseline:  Goal status: Met - 06/27/22  2.   Pt will demonstrate improved UE functional use for ADLs as evidenced by increasing box/ blocks score by 4 blocks with RUE.  Baseline: R: 20 blocks and L: 53  Goal status: Met - 28 blocks on R on 06/27/22  3.   Pt will demonstrate improved wrist mobility to allow for increased ease and independence with combing/brushing hair.  Baseline:  Goal status: Met - 06/27/22   LONG TERM GOALS: Target date: 07/18/22  Pt will demonstrate increased hand function and grasp to open personal medication bottles (including child proof) and dispense meds with <2 drops of medication. Baseline:  Goal status: MET  - 07/03/22  2.  Pt will demonstrate use of BUE together to engage in functional IADL tasks (simple meal prep, folding laundry, etc). Baseline:  Goal status: IN PROGRESS  3.  Pt will demonstrate increased functional reach and grasp to obtain or place light weight item from/on moderate height shelf to simulate IADLs. Baseline:  Goal status: IN PROGRESS  4. Pt will increase RUE grip strength by 5# to increase success with holding and manipulating dishes and pots/pain for cooking.  Baseline: 15#  Goal status: IN PROGRESS  5.  Pt will be able to complete 9 hole peg test with RUE in 2 min time limit to demonstrate increased fine motor control  as needed for ADLs/IADLs.  Baseline:   Goal status: IN PROGRESS  6.  Pt will demonstrate spasticity management  strategies.  Baseline:  Goal status: IN PROGRESS   ASSESSMENT:  CLINICAL IMPRESSION: Pt continues to demonstrate improved grasp and coordination during structured and functional tasks.  Pt able to complete increased precision pinch and progressing towards rotation of items in finger tips.  Pt demonstrating decreased supination, therefore educated on positioning when to accommodate for vs when to focus on movement.  PERFORMANCE DEFICITS in functional skills including ADLs, IADLs, coordination, dexterity, sensation, ROM, strength, FMC, GMC, balance, body mechanics, decreased knowledge of use of DME, and UE functional use and psychosocial skills including environmental adaptation, habits, and routines and behaviors.   IMPAIRMENTS are limiting patient from ADLs, IADLs, and work.   COMORBIDITIES may have co-morbidities  that affects occupational performance. Patient will benefit from skilled OT to address above impairments and improve overall function.  MODIFICATION OR ASSISTANCE TO COMPLETE EVALUATION: Min-Moderate modification of tasks or assist with assess necessary to complete an evaluation.  OT OCCUPATIONAL PROFILE AND HISTORY: Detailed assessment: Review of records and additional review of physical, cognitive, psychosocial history related to current functional performance.  CLINICAL DECISION MAKING: Moderate - several treatment options, min-mod task modification necessary  REHAB POTENTIAL: Good  EVALUATION COMPLEXITY: Moderate    PLAN: OT FREQUENCY: 2x/week  OT DURATION: 6 weeks  PLANNED INTERVENTIONS: self care/ADL training, therapeutic exercise, therapeutic activity, neuromuscular re-education, manual therapy, passive range of motion, balance training, functional mobility training, splinting, electrical stimulation, ultrasound, compression bandaging, moist heat, cryotherapy, patient/family education, psychosocial skills training, energy conservation, coping strategies training,  and DME and/or AE instructions  RECOMMENDED OTHER SERVICES: N/A  CONSULTED AND AGREED WITH PLAN OF CARE: Patient and family member/caregiver  PLAN FOR NEXT SESSION: Engage in wrist, thumb ROM. Attempt thumb extension.  WB through [place].  Gross grasp and reach activities.  [place] with built up handle, handwriting.     Simonne Come, OTR/L 07/08/2022, 8:58 AM

## 2022-07-08 NOTE — Telephone Encounter (Signed)
NPS[employer]-Medicare/BCBS fed no auth req'd for either -pt chose inlab   Patient is scheduled at [facility] for 09/01/22 at 9 pm.  Mailed packet to the patient.

## 2022-07-08 NOTE — Telephone Encounter (Signed)
Pt [place] form faxed on 07/08/2022

## 2022-07-08 NOTE — Telephone Encounter (Signed)
Pt [employer] faxed on 07/08/2022

## 2022-07-09 ENCOUNTER — Ambulatory Visit: Payer: Medicare Other

## 2022-07-09 DIAGNOSIS — R4701 Aphasia: Secondary | ICD-10-CM | POA: Diagnosis not present

## 2022-07-09 DIAGNOSIS — R482 Apraxia: Secondary | ICD-10-CM | POA: Diagnosis not present

## 2022-07-09 DIAGNOSIS — R41841 Cognitive communication deficit: Secondary | ICD-10-CM

## 2022-07-09 DIAGNOSIS — R208 Other disturbances of skin sensation: Secondary | ICD-10-CM | POA: Diagnosis not present

## 2022-07-09 DIAGNOSIS — R471 Dysarthria and anarthria: Secondary | ICD-10-CM

## 2022-07-09 DIAGNOSIS — M6281 Muscle weakness (generalized): Secondary | ICD-10-CM | POA: Diagnosis not present

## 2022-07-09 DIAGNOSIS — I69351 Hemiplegia and hemiparesis following cerebral infarction affecting right dominant side: Secondary | ICD-10-CM | POA: Diagnosis not present

## 2022-07-09 DIAGNOSIS — R278 Other lack of coordination: Secondary | ICD-10-CM | POA: Diagnosis not present
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
## 2022-07-10 ENCOUNTER — Encounter: Payer: Self-pay | Admitting: Rheumatology

## 2022-07-10 ENCOUNTER — Ambulatory Visit (INDEPENDENT_AMBULATORY_CARE_PROVIDER_SITE_OTHER): Payer: Medicare Other | Admitting: *Deleted

## 2022-07-10 ENCOUNTER — Ambulatory Visit: Payer: Medicare Other | Attending: Rheumatology | Admitting: Rheumatology

## 2022-07-10 VITALS — BP 153/79 | HR 61 | Resp 14 | Ht 60.0 in | Wt 148.8 lb

## 2022-07-10 DIAGNOSIS — Z8639 Personal history of other endocrine, nutritional and metabolic disease: Secondary | ICD-10-CM | POA: Diagnosis present

## 2022-07-10 DIAGNOSIS — Z8709 Personal history of other diseases of the respiratory system: Secondary | ICD-10-CM

## 2022-07-10 DIAGNOSIS — M4626 Osteomyelitis of vertebra, lumbar region: Secondary | ICD-10-CM | POA: Insufficient documentation

## 2022-07-10 DIAGNOSIS — Z981 Arthrodesis status: Secondary | ICD-10-CM | POA: Diagnosis not present

## 2022-07-10 DIAGNOSIS — R21 Rash and other nonspecific skin eruption: Secondary | ICD-10-CM

## 2022-07-10 DIAGNOSIS — I639 Cerebral infarction, unspecified: Secondary | ICD-10-CM

## 2022-07-10 DIAGNOSIS — M25511 Pain in right shoulder: Secondary | ICD-10-CM | POA: Diagnosis not present

## 2022-07-10 DIAGNOSIS — Z8659 Personal history of other mental and behavioral disorders: Secondary | ICD-10-CM | POA: Insufficient documentation

## 2022-07-10 DIAGNOSIS — Z5181 Encounter for therapeutic drug level monitoring: Secondary | ICD-10-CM | POA: Insufficient documentation

## 2022-07-10 DIAGNOSIS — I7300 Raynaud's syndrome without gangrene: Secondary | ICD-10-CM | POA: Diagnosis not present

## 2022-07-10 DIAGNOSIS — M7062 Trochanteric bursitis, left hip: Secondary | ICD-10-CM | POA: Diagnosis not present

## 2022-07-10 DIAGNOSIS — M4646 Discitis, unspecified, lumbar region: Secondary | ICD-10-CM

## 2022-07-10 DIAGNOSIS — R4701 Aphasia: Secondary | ICD-10-CM | POA: Diagnosis not present

## 2022-07-10 DIAGNOSIS — I214 Non-ST elevation (NSTEMI) myocardial infarction: Secondary | ICD-10-CM | POA: Diagnosis not present

## 2022-07-10 DIAGNOSIS — I63412 Cerebral infarction due to embolism of left middle cerebral artery: Secondary | ICD-10-CM | POA: Diagnosis not present

## 2022-07-10 DIAGNOSIS — R5383 Other fatigue: Secondary | ICD-10-CM | POA: Diagnosis not present

## 2022-07-10 DIAGNOSIS — I10 Essential (primary) hypertension: Secondary | ICD-10-CM | POA: Insufficient documentation

## 2022-07-10 DIAGNOSIS — I513 Intracardiac thrombosis, not elsewhere classified: Secondary | ICD-10-CM | POA: Insufficient documentation

## 2022-07-10 DIAGNOSIS — I5181 Takotsubo syndrome: Secondary | ICD-10-CM

## 2022-07-10 DIAGNOSIS — I5020 Unspecified systolic (congestive) heart failure: Secondary | ICD-10-CM | POA: Diagnosis present

## 2022-07-10 DIAGNOSIS — M7061 Trochanteric bursitis, right hip: Secondary | ICD-10-CM

## 2022-07-10 DIAGNOSIS — G8929 Other chronic pain: Secondary | ICD-10-CM

## 2022-07-10 DIAGNOSIS — M2550 Pain in unspecified joint: Secondary | ICD-10-CM | POA: Diagnosis not present

## 2022-07-10 DIAGNOSIS — I6602 Occlusion and stenosis of left middle cerebral artery: Secondary | ICD-10-CM | POA: Diagnosis present

## 2022-07-10 LAB — POCT INR: INR: 2.5 (ref 2.0–3.0)

## 2022-07-10 NOTE — Patient Instructions (Signed)
Iliotibial Band Syndrome Rehab Ask your health care provider which exercises are safe for you. Do exercises exactly as told by your health care provider and adjust them as directed. It is normal to feel mild stretching, pulling, tightness, or discomfort as you do these exercises. Stop right away if you feel sudden pain or your pain gets significantly worse. Do not begin these exercises until told by your health care provider. Stretching and range-of-motion exercises These exercises warm up your muscles and joints and improve the movement and flexibility of your hip and pelvis. Quadriceps stretch, prone  Lie on your abdomen (prone position) on a firm surface, such as a bed or padded floor. Bend your left / right knee and reach back to hold your ankle or pant leg. If you cannot reach your ankle or pant leg, loop a belt around your foot and grab the belt instead. Gently pull your heel toward your buttocks. Your knee should not slide out to the side. You should feel a stretch in the front of your thigh and knee (quadriceps). Hold this position for __________ seconds. Repeat __________ times. Complete this exercise __________ times a day. Iliotibial band stretch An iliotibial band is a strong band of muscle tissue that runs from the outer side of your hip to the outer side of your thigh and knee. Lie on your side with your left / right leg in the top position. Bend both of your knees and grab your left / right ankle. Stretch out your bottom arm to help you balance. Slowly bring your top knee back so your thigh goes behind your trunk. Slowly lower your top leg toward the floor until you feel a gentle stretch on the outside of your left / right hip and thigh. If you do not feel a stretch and your knee will not fall farther, place the heel of your other foot on top of your knee and pull your knee down toward the floor with your foot. Hold this position for __________ seconds. Repeat __________ times.  Complete this exercise __________ times a day. Strengthening exercises These exercises build strength and endurance in your hip and pelvis. Endurance is the ability to use your muscles for a long time, even after they get tired. Straight leg raises, side-lying This exercise strengthens the muscles that rotate the leg at the hip and move it away from your body (hip abductors). Lie on your side with your left / right leg in the top position. Lie so your head, shoulder, hip, and knee line up. You may bend your bottom knee to help you balance. Roll your hips slightly forward so your hips are stacked directly over each other and your left / right knee is facing forward. Tense the muscles in your outer thigh and lift your top leg 4-6 inches (10-15 cm). Hold this position for __________ seconds. Slowly lower your leg to return to the starting position. Let your muscles relax completely before doing another repetition. Repeat __________ times. Complete this exercise __________ times a day. Leg raises, prone This exercise strengthens the muscles that move the hips backward (hip extensors). Lie on your abdomen (prone position) on your bed or a firm surface. You can put a pillow under your hips if that is more comfortable for your lower back. Bend your left / right knee so your foot is straight up in the air. Squeeze your buttocks muscles and lift your left / right thigh off the bed. Do not let your back arch. Tense   your thigh muscle as hard as you can without increasing any knee pain. Hold this position for __________ seconds. Slowly lower your leg to return to the starting position and allow it to relax completely. Repeat __________ times. Complete this exercise __________ times a day. Hip hike Stand sideways on a bottom step. Stand on your left / right leg with your other foot unsupported next to the step. You can hold on to a railing or wall for balance if needed. Keep your knees straight and your  torso square. Then lift your left / right hip up toward the ceiling. Slowly let your left / right hip lower toward the floor, past the starting position. Your foot should get closer to the floor. Do not lean or bend your knees. Repeat __________ times. Complete this exercise __________ times a day. This information is not intended to replace advice given to you by your health care provider. Make sure you discuss any questions you have with your health care provider. Document Revised: 07/20/2019 Document Reviewed: 07/20/2019 Elsevier Patient Education  2023 [employer] Inc.  

## 2022-07-10 NOTE — Patient Instructions (Signed)
Description   Continue taking warfarin 1 tablet daily. Continue green leafy veggie to weekly. Recheck INR in 3 weeks.  [facility] [phone]

## 2022-07-11 ENCOUNTER — Ambulatory Visit: Payer: Medicare Other | Admitting: Occupational Therapy

## 2022-07-11 ENCOUNTER — Encounter: Payer: Medicare Other | Attending: Registered Nurse | Admitting: Physical Medicine and Rehabilitation

## 2022-07-11 ENCOUNTER — Ambulatory Visit: Payer: Medicare Other

## 2022-07-11 VITALS — BP 143/90 | HR 94 | Ht 60.0 in | Wt 150.0 lb

## 2022-07-11 DIAGNOSIS — R208 Other disturbances of skin sensation: Secondary | ICD-10-CM | POA: Diagnosis not present

## 2022-07-11 DIAGNOSIS — R29898 Other symptoms and signs involving the musculoskeletal system: Secondary | ICD-10-CM | POA: Diagnosis not present

## 2022-07-11 DIAGNOSIS — Z8673 Personal history of transient ischemic attack (TIA), and cerebral infarction without residual deficits: Secondary | ICD-10-CM | POA: Diagnosis not present

## 2022-07-11 DIAGNOSIS — M19211 Secondary osteoarthritis, right shoulder: Secondary | ICD-10-CM | POA: Insufficient documentation

## 2022-07-11 DIAGNOSIS — R482 Apraxia: Secondary | ICD-10-CM

## 2022-07-11 DIAGNOSIS — M75101 Unspecified rotator cuff tear or rupture of right shoulder, not specified as traumatic: Secondary | ICD-10-CM | POA: Diagnosis not present

## 2022-07-11 DIAGNOSIS — R471 Dysarthria and anarthria: Secondary | ICD-10-CM

## 2022-07-11 DIAGNOSIS — M6281 Muscle weakness (generalized): Secondary | ICD-10-CM | POA: Diagnosis not present

## 2022-07-11 DIAGNOSIS — R4701 Aphasia: Secondary | ICD-10-CM

## 2022-07-11 DIAGNOSIS — I5022 Chronic systolic (congestive) heart failure: Secondary | ICD-10-CM | POA: Diagnosis not present

## 2022-07-11 DIAGNOSIS — Z0000 Encounter for general adult medical examination without abnormal findings: Secondary | ICD-10-CM | POA: Diagnosis not present

## 2022-07-11 DIAGNOSIS — R41841 Cognitive communication deficit: Secondary | ICD-10-CM | POA: Diagnosis not present

## 2022-07-11 DIAGNOSIS — I69351 Hemiplegia and hemiparesis following cerebral infarction affecting right dominant side: Secondary | ICD-10-CM | POA: Diagnosis not present

## 2022-07-11 DIAGNOSIS — I10 Essential (primary) hypertension: Secondary | ICD-10-CM | POA: Diagnosis not present

## 2022-07-11 DIAGNOSIS — R278 Other lack of coordination: Secondary | ICD-10-CM | POA: Diagnosis not present

## 2022-07-11 MED ORDER — PREGABALIN 150 MG PO CAPS: 150.0000 mg | ORAL_CAPSULE | Freq: Two times a day (BID) | ORAL | 3 refills | Status: DC

## 2022-07-11 MED ORDER — TOPIRAMATE 50 MG PO TABS: 50.0000 mg | ORAL_TABLET | Freq: Two times a day (BID) | ORAL | 3 refills | Status: DC

## 2022-07-11 NOTE — Patient Instructions (Signed)
Foods that can assist in weight loss: 1) leafy greens- high in fiber and nutrients 2) dark chocolate- improves metabolism (if prefer sweetened, best to sweeten with honey instead of sugar).  3) cruciferous vegetables- high in fiber and protein 4) full fat yogurt: high in healthy fat, protein, calcium, and probiotics 5) apples- high in a variety of phytochemicals 6) nuts- high in fiber and protein that increase feelings of fullness 7) grapefruit: rich in nutrients, antioxidants, and fiber (not to be taken with anticoagulation) 8) beans- high in protein and fiber 9) salmon- has high quality protein and healthy fats 10) green tea- rich in polyphenols 11) eggs- rich in choline and vitamin D 12) tuna- high protein, boosts metabolism 13) avocado- decreases visceral abdominal fat 14) chicken (pasture raised): high in protein and iron 15) blueberries- reduce abdominal fat and cholesterol 16) whole grains- decreases calories retained during digestion, speeds metabolism 17) chia seeds- curb appetite 18) chilies- increases fat metabolism  -Discussed supplements that can be used:  1) Metatrim 460m BID 30 minutes before breakfast and dinner  2) Sphaeranthus indicus and Garcinia mangostana (combinations of these and #1 can be found in capsicum and zychrome  3) green coffee bean extract [id number] per day or Irvingia (african mango) 150 to [id number] per day.

## 2022-07-11 NOTE — Progress Notes (Signed)
Subjective:    Patient ID: Chloe Johnson, female    DOB: [birth year], 67 y.o.   MRN: [id number]  HPI:  .cpr Chloe Johnson is a 67 y.o. female who is here for f/u of right shoulder pain, Left Middle Cerebral Artery Stroke, Left Ventricular Thrombus and Essential Hypertension.   Dr. Curly Shores H&P on 01/28/2022 HPI: Chloe Johnson is a 67 y.o. 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 Leonel Ramsay, with key details confirmed by husband Chloe Johnson  is a 73 y.o.  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 She presents today after the following series events.  Around 12 PM this past Saturday, the patient noted sudden onset dizziness described as "vertigo" followed by nausea then followed by an episode of vomiting.  She next felt a sudden onset sharp chest pain with radiation to her back.  She assumed it was her cervical radiculopathy "acting up" because shortly after this event, she was holding 2 plates in her hand to feed her cats when she suddenly had no control of both upper extremities and dropped said plates.  On Sunday, she felt generally weak but then improved from all of this until this morning, around 9 AM, when she experienced word finding difficulties.  Her husband, who is at bedside, also notes she had a right facial droop transiently that lasted no more than a few minutes.  She may have had 2 subsequent episodes, as documented by ED  physician; however, all of her is told by the patient's husband that this was 1 single episode."   Her hospital course was noted for acute worsening for which she underwent thrombectomy and was discharged with only some very mild residual aphasia not detectable on examination but noticed by the patient per husband.  He notes that she has been in her normal state of health since discharge other than a visit to the ED on 9/3 with left shoulder pain which was attributed to her known cervical stenosis for which she is planned for surgery in the near future   Husband reports that she was taking his blood pressure when suddenly the blood pressure monitor fell off the table.  She tried to catch it but lost her balance had weakness in both of her legs.  Initially she got to both both arms but then became weak on the right side.  She was unable to speak at all suddenly during this event.  Work-up revealed a left ventricle apical thrombus.   Has been reports she has not missed any of her doses of medication including Eliquis   CTA was attempted to confirm recurrent acute occlusion, however due to IV infiltration and severity of her symptoms decision was made to proceed directly to IR which was discussed both with Dr. Estanislado Pandy as well as with the patient's husband   MR Brain:  IMPRESSION: 1. 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 2. No intracranial large vessel occlusion or significant stenosis. In particular, left MCA branches appear perfused and without focal stenosis.  MRA:  IMPRESSION:  1. 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 2. No intracranial large vessel occlusion or significant stenosis. In particular, left MCA branches appear perfused and without focal stenosis.  Dr. Treasa School COURSE Stroke:  left MCA punctate scattered infarct with left M2 occlusion s/p IR with [id number], embolic secondary to LV thrombus  recently on Eliquis CT no acute abnormality IR left superior M2 occlusion, inferior M3/M4 nonocclusive thrombus Status post EVT with TICI2c MRI left insular cortex and frontal lobe punctate infarcts MRA unremarkable 2D Echo EF 45 to 50% on 8/29, LV regional motion abnormality with LV thrombus LDL 150 on 8/29 HgbA1c 5 0 on 8/29 UDS positive for THC Hypercoagulable and autoimmune work up negative Heparin IV for VTE prophylaxis aspirin 81 mg daily and Eliquis (apixaban) daily prior to admission,  now on coumadin with lovenox bridge. INR goal 2.5 Continue ASA 81 per cardiology. Ongoing aggressive stroke risk factor management Therapy recommendations:  CIR Disposition:  pending   Hx of stroke and LV thrombus 8/29 admitted for nausea vomiting dizziness chest pain and then developed right facial droop and aphasia.  Found to have troponin > 300, EF 45 to 50% with LV thrombus.  CTA coronary artery showed minimal CAD.  CT no acute abnormality.  CTA head and neck showed left M2 occlusion.  CTP 0/7.  Status post EVT with TICI2c.  MRI showed left frontal Danh matter punctate infarct.  Repeat MRI showed left external capsule, insular cortex and frontal cortex punctate infarcts.  LDL 150, A1c 5.0.  UDS positive for THC.  Patient was put on heparin IV and aspirin and eventually discharged on Eliquis and aspirin as well as Crestor 20. Per family, patient has slight expressive aphasia since discharge, no physical deficit. Cardiology Dr. Johnsie Cancel on board, recommend coumadin with INR goal 2.5. on lovenox bridge.    Ms. Schnackenberg was admitted to inpatient rehabilitation on 02/04/2022 and discharged home on 02/07/2022. She will begin outpatient Therapy on 02/25/2022 at [facility].  Ms. Okland has expressive aphasia.  She was positive with THC, she reports she uses CBD products for anxiety, this was discussed with Dr Ranell Patrick. Dr Ranell Patrick stated she can continue with her CBD product, she  verbalizes understanding.   1) Right shoulder pain  -started suddenly when she was doing an activity (history is limited by her aphasia) -she feels this pain is more than just spasticity -it is very severe -she has been doing better with the increase in Lyrica to 247m BID -XR discussed and she would like to get this -she prefers nonsurgical treatment if possible.  -she discussed with Dr. VGriffin Basiland surgery was not recommended due to risk of re-tear -she is scheduled for steroid injection -she would be interested in starting topamax which we discussed before -pain has returned but she sees Dr. VGriffin Basilnext week and she is going to ask for another injection -she thinks her shoulder is healing -positioning in car was causing the 10/10 of pain  2) Aphasia is greatly improving and she continues working with therapy -she is speaking so well today -not frustrated with speech but it is seems like it it shouldn't be this hard -she is still struggling -notes word finding difficuties -sometimes she feels frustrated as she can't find the thought she wants.   Pain Inventory Average Pain 4 Pain Right Now 4 My pain is constant and aching  LOCATION OF PAIN  Neck,Wrist,Hand,Back  BOWEL Number of stools per week: 14  Type of laxative Super Cleanse  BLADDER Normal    Mobility walk without assistance how many minutes can you walk? As needed ability to climb steps?  yes do you drive?  no Do you have any goals in this area?  yes  Function employed # of hrs/week 70  Neuro/Psych bowel control problems weakness numbness anxiety  Prior Studies Any changes since last visit?  no  Physicians involved in your care Any changes since last visit?  no   Family History  Problem Relation Age of Onset   Other Mother    Other Father    Hypertension Father    Cancer Sister    Sleep apnea Sister    Social History   Socioeconomic History   Marital status: Married    Spouse name:  Juanda Crumble   Number of children: 3   Years of education: RN   Highest education level: Not on file  Occupational History    Employer: [employer]   Tobacco Use   Smoking status: Never    Passive exposure: Past   Smokeless tobacco: Never  Vaping Use   Vaping Use: Never used  Substance and Sexual Activity   Alcohol use: Yes    Alcohol/week: 2.0 standard drinks of alcohol    Types: 2 Shots of liquor per week    Comment: "a little"; 1/2 a shot x 3 per week   Drug use: No    Comment: takes CBD gummies for sleep and anxiety   Sexual activity: Yes    Birth control/protection: Post-menopausal  Other Topics Concern   Not on file  Social History Narrative   Patient is married and lives at home with her husband Juanda Crumble).  Patient works full time as a Therapist, sports in [employer] (currently out of work on short term disability since her stroke and heart attack). Right handed.College education.         Social Determinants of Health   Financial Resource Strain: Not on file  Food Insecurity: Not on file  Transportation Needs: Not on file  Physical Activity: Not on file  Stress: Not on file  Social Connections: Not on file   Past Surgical History:  Procedure Laterality Date   BREAST CYST EXCISION Left 2010   "polypectomy"   IR CT HEAD LTD  01/21/2022   IR CT HEAD LTD  01/28/2022   IR PERCUTANEOUS ART THROMBECTOMY/INFUSION INTRACRANIAL INC DIAG ANGIO  01/21/2022   IR PERCUTANEOUS ART THROMBECTOMY/INFUSION INTRACRANIAL INC DIAG ANGIO  01/28/2022   IR US GUIDE VASC ACCESS RIGHT  01/22/2022   PICC LINE PLACE PERIPHERAL ([facility] HX) Right    for use of Levaquin & Vancomycin, of note: she reports that she had a "flulike feeling"    RADIOLOGY WITH ANESTHESIA N/A 01/21/2022   Procedure: IR WITH ANESTHESIA;  Surgeon: Luanne Bras, MD;  Location: [facility];  Service: Radiology;  Laterality: N/A;   RADIOLOGY WITH ANESTHESIA N/A 01/28/2022   Procedure: IR WITH ANESTHESIA;  Surgeon: Radiologist,  Medication, MD;  Location: [facility];  Service: Radiology;  Laterality: N/A;   TONSILLECTOMY AND ADENOIDECTOMY  1975   TUBAL LIGATION  1999   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/16/2013   L3-4/notes 11/24/2013, arms, neck   Exertional asthma    GERD (gastroesophageal reflux disease)    Heart attack ([place]) 01/18/2022   Hypertension    Kidney stones    "have always passed them"  Neck pain    Osteomyelitis ([place]) 11/23/2013   osteomyelitis, discitis    Sleep concern    uses Prozac for sleep    Stroke ([place]) 01/21/2022   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; left with aphasia, dysarthria, and apraxia   There were no vitals taken for this visit.  Opioid Risk Score:   Fall Risk Score:  `1  Depression screen [facility] 2/9     02/20/2022    9:22 AM 02/15/2014   11:06 AM 01/18/2014    2:15 PM 12/23/2013   10:14 AM  Depression screen PHQ 2/9  Decreased Interest 0 0 0 0  Down, Depressed, Hopeless 0 1 0 0  PHQ - 2 Score 0 1 0 0  Altered sleeping 0     Tired, decreased energy 1     Change in appetite 0     Feeling bad or failure about yourself  0     Trouble concentrating 0     Moving slowly or fidgety/restless 0     Suicidal thoughts 0     PHQ-9 Score 1     Difficult doing work/chores Not difficult at all         Review of Systems  Musculoskeletal:  Positive for back pain and neck pain.       Wrist and hand pain   All other systems reviewed and are negative.      Objective:  Gen: no distress, normal appearing HEENT: oral mucosa pink and moist, NCAT Cardio: Reg rate Chest: normal effort, normal rate of breathing Abd: soft, non-distended Ext: no edema Psych: pleasant, normal affect Skin: intact Neuro: Alert and oriented x3 MSK: mild spasticity in right hand fingers. Sensation intact. Thumb flexion 2/5       Assessment & Plan:  Left Middle Cerebral Artery Stroke: Continue current medication regimen. Has a scheduled  appointment with Neurology. She will begin outpatient therapy at [facility]. Continue to Monitor. Continue speech therapy to continue working on aphasia Referred to OT for vivstem referral Left Ventricular Thrombus: Continue current medication regimen. Cardiology Following.  Essential Hypertension. Continue current medication regimen. PCP Following. Continue to Monitor.  Right shoulder pain: Discussed that MRI shows complete tear, referred to ortho Discussed extracorporeal shockwave therapy as a modality for treatment. Discussed that the device looks and feels like a massage gun and I would move it over the area of pain for about 10 minutes. The device releases sound waves to the area of pain and helps to improve blood flow and circulation to improve the healing process. Discuss that this initially induces inflammation and can sometimes cause short-term increase in pain. Discussed that we typically do three weekly treatments, but sometimes up to 6 if needed, and after 6 weeks long term benefits can sometimes be achieved.  Lyrica increased to 274m BID, discussed that she can continue this dose if she wants but given her unwanted weight gain we can alternatively replace her morning dose with low dose naltrexone. She preferred to try this latter option. -discussed mechanism of action of low dose naltrexone as an opioid receptor antagonist which stimulates your body's production of its own natural endogenous opioids, helping to decrease pain. Discussed that it can also decrease T cell response and thus be helpful in decreasing inflammation, and symptoms of brain fog, fatigue, anxiety, depression, and allergies. Discussed that this medication needs to be compounded at a compounding pharmacy and can more expensive. Discussed that I usually start at 116mand if this is not  providing enough relief then I titrate upward on a monthly basis.   Prescribed topamax to help improve sleep and pain,  discussed that it can also curb appetite Encouraged follow-up with Dr. Griffin Basil next week  5. Overweight: -continue topamax 59m HS -decrease Lyrica to 1591mBID -Educated that current weight is 150 lbs, BMI 29.29 -Educated regarding health benefits of weight loss- for pain, general health, chronic disease prevention, immune health, mental health.  -Will monitor weight every visit.  -Consider Ro[place]ois tea daily.  -Discussed the benefits of intermittent fasting. -Discussed foods that can assist in weight loss: 1) leafy greens- high in fiber and nutrients 2) dark chocolate- improves metabolism (if prefer sweetened, best to sweeten with honey instead of sugar).  3) cruciferous vegetables- high in fiber and protein 4) full fat yogurt: high in healthy fat, protein, calcium, and probiotics 5) apples- high in a variety of phytochemicals 6) nuts- high in fiber and protein that increase feelings of fullness 7) grapefruit: rich in nutrients, antioxidants, and fiber (not to be taken with anticoagulation) 8) beans- high in protein and fiber 9) salmon- has high quality protein and healthy fats 10) green tea- rich in polyphenols 11) eggs- rich in choline and vitamin D 12) tuna- high protein, boosts metabolism 13) avocado- decreases visceral abdominal fat 14) chicken (pasture raised): high in protein and iron 15) blueberries- reduce abdominal fat and cholesterol 16) whole grains- decreases calories retained during digestion, speeds metabolism 17) chia seeds- curb appetite 18) chilies- increases fat metabolism  -Discussed supplements that can be used:  1) Metatrim 40015mID 30 minutes before breakfast and dinner  2) Sphaeranthus indicus and Garcinia mangostana (combinations of these and #1 can be found in capsicum and zychrome  3) green coffee bean extract 400m92mice per day or Irvingia (african mango) 150 to 300mg32mce per day.   5. Right hand weakness:  -continue OT   >40 minutes spent in  review of chart, discussion of right hand weakness, Vivistim to improve right hand function, lyrica and topamax and low dose naltrexone for chronic pain, her desire to loose weight, her aphasia, her goals to return to work, dietary education
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
## 2022-07-14 ENCOUNTER — Ambulatory Visit: Payer: Medicare Other

## 2022-07-14 DIAGNOSIS — R4701 Aphasia: Secondary | ICD-10-CM | POA: Diagnosis not present

## 2022-07-14 DIAGNOSIS — R471 Dysarthria and anarthria: Secondary | ICD-10-CM

## 2022-07-14 DIAGNOSIS — R41841 Cognitive communication deficit: Secondary | ICD-10-CM | POA: Diagnosis not present

## 2022-07-14 DIAGNOSIS — R482 Apraxia: Secondary | ICD-10-CM

## 2022-07-14 DIAGNOSIS — M6281 Muscle weakness (generalized): Secondary | ICD-10-CM | POA: Diagnosis not present

## 2022-07-14 DIAGNOSIS — R208 Other disturbances of skin sensation: Secondary | ICD-10-CM | POA: Diagnosis not present

## 2022-07-14 DIAGNOSIS — I69351 Hemiplegia and hemiparesis following cerebral infarction affecting right dominant side: Secondary | ICD-10-CM | POA: Diagnosis not present

## 2022-07-14 DIAGNOSIS — R278 Other lack of coordination: Secondary | ICD-10-CM | POA: Diagnosis not present
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
## 2022-07-15 ENCOUNTER — Ambulatory Visit: Payer: Medicare Other

## 2022-07-15 DIAGNOSIS — M6281 Muscle weakness (generalized): Secondary | ICD-10-CM | POA: Diagnosis not present

## 2022-07-15 DIAGNOSIS — I69351 Hemiplegia and hemiparesis following cerebral infarction affecting right dominant side: Secondary | ICD-10-CM | POA: Diagnosis not present

## 2022-07-15 DIAGNOSIS — R471 Dysarthria and anarthria: Secondary | ICD-10-CM | POA: Diagnosis not present

## 2022-07-15 DIAGNOSIS — M75111 Incomplete rotator cuff tear or rupture of right shoulder, not specified as traumatic: Secondary | ICD-10-CM | POA: Diagnosis not present

## 2022-07-15 DIAGNOSIS — R482 Apraxia: Secondary | ICD-10-CM

## 2022-07-15 DIAGNOSIS — R278 Other lack of coordination: Secondary | ICD-10-CM | POA: Diagnosis not present

## 2022-07-15 DIAGNOSIS — R41841 Cognitive communication deficit: Secondary | ICD-10-CM | POA: Diagnosis not present

## 2022-07-15 DIAGNOSIS — R4701 Aphasia: Secondary | ICD-10-CM | POA: Diagnosis not present

## 2022-07-15 DIAGNOSIS — R208 Other disturbances of skin sensation: Secondary | ICD-10-CM | POA: Diagnosis not present

## 2022-07-15 LAB — ANTI-DNA ANTIBODY, DOUBLE-STRANDED: ds DNA Ab: 1 IU/mL

## 2022-07-15 LAB — SJOGRENS SYNDROME-A EXTRACTABLE NUCLEAR ANTIBODY: SSA (Ro) (ENA) Antibody, IgG: <1 AI

## 2022-07-15 LAB — ANA: Anti Nuclear Antibody (ANA): POSITIVE — AB

## 2022-07-15 LAB — ANTI-NUCLEAR AB-TITER (ANA TITER): ANA Titer 1: 1:80 {titer} — ABNORMAL HIGH

## 2022-07-15 LAB — RNP ANTIBODY: Ribonucleic Protein(ENA) Antibody, IgG: <1 AI

## 2022-07-15 LAB — ANTI-SCLERODERMA ANTIBODY: Scleroderma (Scl-70) (ENA) Antibody, IgG: <1 AI

## 2022-07-15 LAB — PROTEIN / CREATININE RATIO, URINE
Creatinine, Urine: 43 mg/dL (ref 20–275)
Total Protein, Urine: <4 mg/dL — ABNORMAL LOW (ref 5–24)

## 2022-07-15 LAB — SJOGRENS SYNDROME-B EXTRACTABLE NUCLEAR ANTIBODY: SSB (La) (ENA) Antibody, IgG: <1 AI

## 2022-07-15 LAB — C3 AND C4
C3 Complement: 152 mg/dL (ref 83–193)
C4 Complement: 36 mg/dL (ref 15–57)

## 2022-07-15 LAB — ANTI-SMITH ANTIBODY: ENA SM Ab Ser-aCnc: <1 AI
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
## 2022-07-15 NOTE — Progress Notes (Signed)
ANA is low titer and not significant.  ENA panel is completely within normal limits.  Complements are normal.  Labs do not indicate an active autoimmune disease.  Urine protein creatinine ratio is normal.  Please discuss lab results with the patient and forward the copy of the lab results to her PCP.  Patient did know to schedule a follow-up visit.  It is okay for her to cancel the new patient follow-up visit.

## 2022-07-18 ENCOUNTER — Ambulatory Visit: Payer: Medicare Other | Admitting: Occupational Therapy

## 2022-07-18 DIAGNOSIS — R208 Other disturbances of skin sensation: Secondary | ICD-10-CM

## 2022-07-18 DIAGNOSIS — M6281 Muscle weakness (generalized): Secondary | ICD-10-CM | POA: Diagnosis not present

## 2022-07-18 DIAGNOSIS — R41841 Cognitive communication deficit: Secondary | ICD-10-CM | POA: Diagnosis not present

## 2022-07-18 DIAGNOSIS — R4701 Aphasia: Secondary | ICD-10-CM | POA: Diagnosis not present

## 2022-07-18 DIAGNOSIS — I69351 Hemiplegia and hemiparesis following cerebral infarction affecting right dominant side: Secondary | ICD-10-CM | POA: Diagnosis not present

## 2022-07-18 DIAGNOSIS — R471 Dysarthria and anarthria: Secondary | ICD-10-CM | POA: Diagnosis not present

## 2022-07-18 DIAGNOSIS — R278 Other lack of coordination: Secondary | ICD-10-CM | POA: Diagnosis not present

## 2022-07-18 DIAGNOSIS — R482 Apraxia: Secondary | ICD-10-CM | POA: Diagnosis not present

## 2022-07-18 NOTE — Therapy (Signed)
OUTPATIENT OCCUPATIONAL THERAPY  Treatment Note   Patient Name: Chloe Johnson MRN: [id number] DOB:1956/04/19, 67 y.o., female Today's Date: 07/18/2022  PCP: Charlane Ferretti, MD REFERRING PROVIDER: Izora Ribas, MD      OT End of Session - 07/18/22 [phone]     Visit Number 32    Number of Visits 35    Date for OT Re-Evaluation 07/18/22    Authorization Type Medicare A&B    OT Start Time 0802    OT Stop Time 0845    OT Time Calculation (min) 43 min    Activity Tolerance Patient tolerated treatment well    Behavior During Therapy [facility] for tasks assessed/performed                                    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/16/2013   L3-4/notes 11/24/2013, arms, neck   Exertional asthma    GERD (gastroesophageal reflux disease)    Heart attack ([place]) 01/18/2022   Hypertension    Kidney stones    "have always passed them"   Neck pain    Osteomyelitis ([place]) 11/23/2013   osteomyelitis, discitis    Sleep concern    uses Prozac for sleep    Stroke ([place]) 01/21/2022   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; left with aphasia, dysarthria, and apraxia   Past Surgical History:  Procedure Laterality Date   BREAST CYST EXCISION Left 2010   "polypectomy"   IR CT HEAD LTD  01/21/2022   IR CT HEAD LTD  01/28/2022   IR PERCUTANEOUS ART THROMBECTOMY/INFUSION INTRACRANIAL INC DIAG ANGIO  01/21/2022   IR PERCUTANEOUS ART THROMBECTOMY/INFUSION INTRACRANIAL INC DIAG ANGIO  01/28/2022   IR US GUIDE VASC ACCESS RIGHT  01/22/2022   PICC LINE PLACE PERIPHERAL ([place] HX) Right    for use of Levaquin & Vancomycin, of note: she reports that she had a "flulike feeling"    RADIOLOGY WITH ANESTHESIA N/A 01/21/2022   Procedure: IR WITH ANESTHESIA;  Surgeon: Luanne Bras, MD;  Location: [place];  Service: Radiology;  Laterality: N/A;   RADIOLOGY WITH ANESTHESIA N/A 01/28/2022   Procedure: IR WITH ANESTHESIA;  Surgeon:  Radiologist, Medication, MD;  Location: [place];  Service: Radiology;  Laterality: N/A;   [place]   Patient Active Problem List   Diagnosis Date Noted   Aphasia 03/31/2022   Snores 03/31/2022   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   UTI (urinary tract infection) 02/04/2022   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   Stroke determined by clinical assessment ([place]) 01/28/2022   Middle cerebral artery embolism, left 01/28/2022   H/O ischemic left MCA stroke    Acute respiratory failure (HCC)    CVA (cerebral vascular accident) ([place]) 01/21/2022   NSTEMI (non-ST elevated myocardial infarction) ([place]) 01/21/2022   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   HFrEF (heart failure with reduced ejection fraction) ([place]) 01/21/2022   Hyperlipidemia 01/21/2022   Lumbar stenosis 01/21/2022   Stroke (cerebrum) ([place]) 01/21/2022   S/P lumbar spinal fusion 11/15/2014   Lumbar discitis 11/29/2013   Discitis 11/24/2013   Diarrhea 11/17/2013   Osteomyelitis of lumbar spine ([place]) 11/16/2013   Hypertension    Asthma    Anxiety     ONSET DATE: 01/28/22  REFERRING DIAG: I63.9 (ICD-10-CM) -  Cerebral infarction, unspecified   THERAPY DIAG:  Hemiplegia and hemiparesis following cerebral infarction affecting right dominant side (HCC)  Other lack of coordination  Muscle weakness (generalized)  Other disturbances of skin sensation  Rationale for Evaluation and Treatment Rehabilitation  SUBJECTIVE:   SUBJECTIVE STATEMENT: Pt reports she saw Dr. Ranell Patrick who began discussing "clinical trial" for vagus nerve stimulation.  Pt get a steroid injection in shoulder this week. Pt accompanied by: self  PERTINENT HISTORY: PMH: HTN, cervical radiculopathy, hepatitis, osteomyelitis/discitis, lumbar stenosis with foot drop anxiety, GERD, insomnia  PRECAUTIONS: Fall  WEIGHT BEARING RESTRICTIONS  was awaiting spinal fusion surgery, limit  bending, twisting, lifting >10#  PAIN:  Are you having pain? No  FALLS: Has patient fallen in last 6 months? Yes. Number of falls 2  LIVING ENVIRONMENT: Lives with: lives with their spouse Lives in: House/apartment Stairs: Yes: Internal: full flights of steps; on right going up and External: 7 steps; on left going up Has following equipment at home: Single point cane, Walker - 2 wheeled, Walker - 4 wheeled, shower chair, and bed side commode  PLOF: Independent  PATIENT GOALS I want my arm back.    OBJECTIVE:   HAND DOMINANCE: Right  ADLs: Transfers/ambulation related to ADLs: Mod I with no use of  Eating: Husband assists with cutting, feeding self with L hand Grooming: utilizing L hand with opening and brushing teeth UB Dressing: unable to manage buttons, otherwise Mod I LB Dressing: unable to tie shoes, just slipping on toes Toileting: Mod I with use of L hand Bathing: Mod I, utilizing L hand to shave Tub Shower transfers: Mod I Equipment: Shower seat with back, Walk in shower, and bed side commode   IADLs: Light housekeeping: laundry and hanging up clothes, wipes counters/sink, needs assist to fold clothing Meal Prep: no, did not cook pre-stroke Medication management: husband opens pill bottles Handwriting: unable  MOBILITY STATUS: Needs Assist: Supervision - Mod I without use of AD  POSTURE COMMENTS:  No Significant postural limitations  ACTIVITY TOLERANCE: Activity tolerance: WNL for tasks assessed on eval  FUNCTIONAL OUTCOME MEASURES: FOTO: 49  UPPER EXTREMITY ROM     Active ROM Right eval Left eval  Shoulder flexion WNL WNL  Shoulder abduction    Shoulder adduction    Shoulder extension    Shoulder internal rotation    Shoulder external rotation    Elbow flexion    Elbow extension -5 WNL  Wrist flexion 38 WNL  Wrist extension 50 WNL  Wrist ulnar deviation    Wrist radial deviation    Wrist pronation    Wrist supination    (Blank rows = not  tested)   UPPER EXTREMITY MMT:     MMT Right eval Left eval  Shoulder flexion 4+/5   Shoulder abduction    Shoulder adduction    Shoulder extension    Shoulder internal rotation    Shoulder external rotation    Middle trapezius    Lower trapezius    Elbow flexion 4+/5   Elbow extension 4+/5   Wrist flexion    Wrist extension    Wrist ulnar deviation    Wrist radial deviation    Wrist pronation    Wrist supination    (Blank rows = not tested)  HAND FUNCTION: Loose gross grasp, unable to squeeze dynamometer 06/06/22: R: 15#  COORDINATION: Box and Blocks:  Right 10 blocks, Left 53 blocks Modified performance with RUE with therapist holding blocks in palm and pt able to complete  gross grasp to pick up one at a time from OT palm of hand, minimal hand opening/closing 04/02/22: Right: 16 blocks  SENSATION: Light touch: WFL Stereognosis: Impaired  Hot/Cold: Impaired  Proprioception: Impaired   COGNITION: Overall cognitive status: Within functional limits for tasks assessed  VISION: Subjective report: Pt reports that her R eye does not close as well and that she feels that her L eye is compensating for her R eye. Baseline vision: Wears glasses all the time  VISION ASSESSMENT: To be further assessed in functional context  --------------------------------------------------------------------------------------------------------------------------------------------------------------------- (objective measures above completed at initial evaluation unless otherwise dated)  TODAY'S TREATMENT:  07/18/22 Vivistim: pt asking questions about "vagus nerve stimulator" with OT answering within her scope and encouraging pt to check out website and ask further questions to her referring provider.  Discussed typical protocol with surgery and OT 3x/week for 90 mins sessions and need for OT to complete full assessment with Gregary Signs - plan to complete at next visit. Coordination: pt unable  to maintain sustained grasp to unscrew nuts/bolts.  Engaged in 9 hole peg test with pt dropping pegs frequently, successfully placing only 2 in the 2 min time limit after taking >45 seconds to get first peg in to hole.  Pt demonstrating improved ease with picking up pegs, however continues with decreased rotation and sustained pinch to place in holes. FOTO: 58 with improvement up from 50 at last assessment UEFS: 51.25% impairment.  Pt identifying areas of challenge, to be further addressed during future sessions and in new goal section. UE ROM: wrist flexion/extension, ulnar/radial deviation, and forearm supination/pronation completed with 1# dumbbell.  Pt reporting pain initially with supination, however with repetition decreased.  Discussed probability of spasticity impacting full ROM and onset of pain.    07/08/22 Coordination: picking up medium sized pegs with RUE with focus on precision pinch and placing in peg board with improved use of precision pinch.  Pt attempting to rotate pegs 180* with ability to complete 90-120* rotation and reporting increased tightness in fingers, limiting movement, with repetition.   Thumb flexion/extension: focus on thumb flexion at IP and [place] joint with easy squeeze ball.  OT providing demonstration for improved placement of ball to facilitate increased thumb flexion.  OT then challenging CMC extension with resistance loop.  Pt able to complete CMC extension with mod difficulty, however unable to extend at IP joint. Wrist extension/flexion: with stacking/unstacking large grip legos. Transitioned to wrist flexion/extension with 1# dumbbell.  Pt demonstrating decreased supination to allow for full wrist flexion, therefore repositioned arm to allow for increased ROM. Forearm supination/pronation: complete with 1# dumbbell.  Pt with decreased supination, lacking ~20* from full supinated position.    07/03/22 Hand grip: pt brought in ball with finger loops that she  purchased.  Pt asking questions about completion of exercises from previous session with this ball.  OT providing demonstration of full flexion/extension of thumb at IP and MP.  Ball allowing for good flexion.  Pt demonstrating mild difficulty with MP flexion, requiring hand over hand for increased technique. Medication management: Completed pill box assessment in 6:50 due to decreased use of RUE.  Pt able to utilize RUE to hold pill bottles while opening with LUE, pouring pills into L hand, and turning lids of pill bottles requiring push on tab as unable to complete pushing down with R thumb.   Built up handle: OT provided pt with built up/red foam handle and educated on placement on utensils and pens to increase functional  use of loose grasp to allow for increased use of dominant RUE with self-feeding and to begin resuming handwriting.  Pt wrote 3 word sentence with RUE with built up handle, with 75% legibility but pt very pleased with ability to write with dominant hand. OT encouraged pt to start with coloring and writing large letters and shapes to focus on motor control and grip strength.    PATIENT EDUCATION: Education details: ongoing education with focus on coordination and progressive ROM, discussed Vivistim protocol Person educated: Patient Education method: Explanation, Demonstration, and Verbal cues Education comprehension: verbalized understanding and needs further education   HOME EXERCISE PROGRAM: Fine motor coordination handout  Access Code: [email or web address] URL: [email or web address] Date: 06/27/2022 Prepared by: [facility]  Exercises - Seated Digit Tendon Gliding  - 2 x daily - 1 sets - 10 reps - Thumb AROM MP Blocking  - 2 x daily - 2 sets - 10 reps - Thumb Radial Adduction with Thumb Flexion AROM on Table  - 2 x daily - 2 sets - 10 reps - Wrist Prayer Stretch  - 2 x daily - 2 sets - 10 reps - 5-10 sec hold - Wrist Flexion and  Extension with Resistance Bar  - 1 x daily - 3 x weekly - 2 sets - 10 reps - Wrist Extension with Resistance  - 1 x daily - 3 x weekly - 2 sets - 5 reps - 3 sec hold - Wrist Flexion with Resistance  - 1 x daily - 3 x weekly - 2 sets - 5 reps - 3 sec hold - Seated Wrist Radial Deviation with Dumbbell  - 1 x daily - 3 x weekly - 2 sets - 10 reps - Seated Wrist Extension with Dumbbell  - 1 x daily - 3 x weekly - 2 sets - 10 reps - Forearm Supination with Dumbbell  - 1 x daily - 3 x weekly - 2 sets - 10 reps      GOALS: Goals reviewed with patient? Yes  SHORT TERM GOALS: Target date: 06/27/22   Pt will be independent with advanced HEP for fine and gross motor control.  Baseline:  Goal status: Met - 06/27/22  2.   Pt will demonstrate improved UE functional use for ADLs as evidenced by increasing box/ blocks score by 4 blocks with RUE.  Baseline: R: 20 blocks and L: 53  Goal status: Met - 28 blocks on R on 06/27/22  3.   Pt will demonstrate improved wrist mobility to allow for increased ease and independence with combing/brushing hair.  Baseline:  Goal status: Met - 06/27/22  NEW SHORT TERM GOALS: Target date: 08/08/22  1.  Pt will demonstrate improved external rotation and sustained grasp to complete grooming of hair with increased ease an independence.  Baseline:  Goal status: INITIAL  2.   Pt will demonstrate improved UE functional use for ADLs as evidenced by increasing box/ blocks score by 4 blocks with RUE.  Baseline: R: 28 blocks and L: 53  Goal status: INITIAL  3.   Pt will demonstrate improved functional use of RUE to allow for increased ease and independence with laundering (washing, folding, ironing) clothes.    Baseline:  Goal status: INITIAL   LONG TERM GOALS: Target date: 07/18/22  Pt will demonstrate increased hand function and grasp to open personal medication bottles (including child proof) and dispense meds with <2 drops of medication. Baseline:  Goal status: MET  -  07/03/22  2.  Pt will demonstrate use of BUE together to engage in functional IADL tasks (simple meal prep, folding laundry, etc). Baseline:  Goal status: IN PROGRESS  3.  Pt will demonstrate increased functional reach and grasp to obtain or place light weight item from/on moderate height shelf to simulate IADLs. Baseline:  Goal status: MET - 07/18/22  4. Pt will increase RUE grip strength by 5# to increase success with holding and manipulating dishes and pots/pain for cooking.  Baseline: 15#  Goal status: NOT MET - 15# on 07/18/22  5.  Pt will be able to complete 9 hole peg test with RUE in 2 min time limit to demonstrate increased fine motor control as needed for ADLs/IADLs.  Baseline:   Goal status: NOT MET - 2 on 07/18/22  6.  Pt will demonstrate spasticity management strategies.  Baseline:  Goal status: MET - 07/18/22  NEW LONG TERM GOALS: Target date: 08/29/22  1.  Pt will demonstrate use of dominant RUE as gross assist to aid in managing clothing fasteners (tying shoe laces and buttons). Baseline:  Goal status: INITIAL  2.  Pt will demonstrate improved coordination as needed to engage in self-feeding with dominant RUE with built up handle PRN as evidenced by decreasing PPT#2 (self feeding) by 3 secs Baseline: TBD Goal status: INITIAL  3.  Pt will demonstrate increased functional reach and grasp to obtain or place moderate weight item from/on moderate height shelf to simulate IADLs. Baseline:  Goal status: INITIAL  4. Pt will increase RUE pinch strength by 3# to increase success with holding and manipulating writing and/or eating utensil.  Baseline:   Goal status: INITIAL  5.  Pt will be able to complete 9 hole peg test with RUE in 2 min time limit to demonstrate increased fine motor control as needed for ADLs/IADLs.  Baseline:   Goal status: INITIAL   ASSESSMENT:  CLINICAL IMPRESSION: Pt continues to be limited by gross and fine motor coordination impacting functional  tasks of self-feeding, grooming, managing clothing fasteners, handwriting, picking up small and/or flat items from table top (ie coins) and lifting any items of weight >1# when reaching to moderate range/shelf height.  Pt has shown improvements in management of spasticity, opening pill bottles and dispensing medications, and placing/retrieving ~1# items in cabinet to engage in simple meal prep.  PERFORMANCE DEFICITS in functional skills including ADLs, IADLs, coordination, dexterity, sensation, ROM, strength, FMC, GMC, balance, body mechanics, decreased knowledge of use of DME, and UE functional use and psychosocial skills including environmental adaptation, habits, and routines and behaviors.   IMPAIRMENTS are limiting patient from ADLs, IADLs, and work.   COMORBIDITIES may have co-morbidities  that affects occupational performance. Patient will benefit from skilled OT to address above impairments and improve overall function.  MODIFICATION OR ASSISTANCE TO COMPLETE EVALUATION: Min-Moderate modification of tasks or assist with assess necessary to complete an evaluation.  OT OCCUPATIONAL PROFILE AND HISTORY: Detailed assessment: Review of records and additional review of physical, cognitive, psychosocial history related to current functional performance.  CLINICAL DECISION MAKING: Moderate - several treatment options, min-mod task modification necessary  REHAB POTENTIAL: Good  EVALUATION COMPLEXITY: Moderate    PLAN: OT FREQUENCY: 1-2x/week  OT DURATION: 6 weeks  PLANNED INTERVENTIONS: self care/ADL training, therapeutic exercise, therapeutic activity, neuromuscular re-education, manual therapy, passive range of motion, balance training, functional mobility training, splinting, electrical stimulation, ultrasound, compression bandaging, moist heat, cryotherapy, patient/family education, psychosocial skills training, energy conservation, coping strategies training, and DME and/or  AE  instructions  RECOMMENDED OTHER SERVICES: N/A  CONSULTED AND AGREED WITH PLAN OF CARE: Patient and family member/caregiver  PLAN FOR NEXT SESSION: Complete Fugyl Meyer assessment, further discuss/assess for Vivistim appropriateness   Uliana Brinker, [place], OTR/L 07/18/2022, 9:47 AM

## 2022-07-21 ENCOUNTER — Ambulatory Visit: Payer: Medicare Other

## 2022-07-21 ENCOUNTER — Encounter: Payer: Self-pay | Admitting: Physical Medicine and Rehabilitation

## 2022-07-21 ENCOUNTER — Ambulatory Visit: Payer: Medicare Other | Admitting: Occupational Therapy

## 2022-07-21 DIAGNOSIS — M6281 Muscle weakness (generalized): Secondary | ICD-10-CM

## 2022-07-21 DIAGNOSIS — I69351 Hemiplegia and hemiparesis following cerebral infarction affecting right dominant side: Secondary | ICD-10-CM | POA: Diagnosis not present

## 2022-07-21 DIAGNOSIS — R41841 Cognitive communication deficit: Secondary | ICD-10-CM

## 2022-07-21 DIAGNOSIS — R471 Dysarthria and anarthria: Secondary | ICD-10-CM | POA: Diagnosis not present

## 2022-07-21 DIAGNOSIS — R4701 Aphasia: Secondary | ICD-10-CM | POA: Diagnosis not present

## 2022-07-21 DIAGNOSIS — R482 Apraxia: Secondary | ICD-10-CM | POA: Diagnosis not present

## 2022-07-21 DIAGNOSIS — R278 Other lack of coordination: Secondary | ICD-10-CM

## 2022-07-21 DIAGNOSIS — R208 Other disturbances of skin sensation: Secondary | ICD-10-CM
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
## 2022-07-22 NOTE — Therapy (Signed)
OUTPATIENT OCCUPATIONAL THERAPY  Treatment Note   Patient Name: Chloe Johnson MRN: [id number] DOB:04-20-1956, 67 y.o., female Today's Date: 07/22/2022  PCP: Charlane Ferretti, MD REFERRING PROVIDER: Izora Ribas, MD      OT End of Session - 07/21/22 1921     Visit Number 33    Number of Visits 22    Date for OT Re-Evaluation 08/29/22    Authorization Type Medicare A&B    OT Start Time 1450    OT Stop Time 1537    OT Time Calculation (min) 47 min    Activity Tolerance Patient tolerated treatment well    Behavior During Therapy WFL for tasks assessed/performed                                    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/16/2013   L3-4/notes 11/24/2013, arms, neck   Exertional asthma    GERD (gastroesophageal reflux disease)    Heart attack ([place]) 01/18/2022   Hypertension    Kidney stones    "have always passed them"   Neck pain    Osteomyelitis ([place]) 11/23/2013   osteomyelitis, discitis    Sleep concern    uses Prozac for sleep    Stroke ([place]) 01/21/2022   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; left with aphasia, dysarthria, and apraxia   Past Surgical History:  Procedure Laterality Date   BREAST CYST EXCISION Left 2010   "polypectomy"   IR CT HEAD LTD  01/21/2022   IR CT HEAD LTD  01/28/2022   IR PERCUTANEOUS ART THROMBECTOMY/INFUSION INTRACRANIAL INC DIAG ANGIO  01/21/2022   IR PERCUTANEOUS ART THROMBECTOMY/INFUSION INTRACRANIAL INC DIAG ANGIO  01/28/2022   IR US GUIDE VASC ACCESS RIGHT  01/22/2022   PICC LINE PLACE PERIPHERAL ([place]) Right    for use of Levaquin & Vancomycin, of note: she reports that she had a "flulike feeling"    RADIOLOGY WITH ANESTHESIA N/A 01/21/2022   Procedure: IR WITH ANESTHESIA;  Surgeon: Luanne Bras, MD;  Location: [facility];  Service: Radiology;  Laterality: N/A;   RADIOLOGY WITH ANESTHESIA N/A 01/28/2022   Procedure: IR WITH ANESTHESIA;  Surgeon:  Radiologist, Medication, MD;  Location: [facility];  Service: Radiology;  Laterality: N/A;   [place]   Patient Active Problem List   Diagnosis Date Noted   Aphasia 03/31/2022   Snores 03/31/2022   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   UTI (urinary tract infection) 02/04/2022   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   Stroke determined by clinical assessment ([place]) 01/28/2022   Middle cerebral artery embolism, left 01/28/2022   H/O ischemic left MCA stroke    Acute respiratory failure (HCC)    CVA (cerebral vascular accident) ([place]) 01/21/2022   NSTEMI (non-ST elevated myocardial infarction) ([place]) 01/21/2022   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   HFrEF (heart failure with reduced ejection fraction) ([place]) 01/21/2022   Hyperlipidemia 01/21/2022   Lumbar stenosis 01/21/2022   Stroke (cerebrum) ([place]) 01/21/2022   S/P lumbar spinal fusion 11/15/2014   Lumbar discitis 11/29/2013   Discitis 11/24/2013   Diarrhea 11/17/2013   Osteomyelitis of lumbar spine ([place]) 11/16/2013   Hypertension    Asthma    Anxiety     ONSET DATE: 01/28/22  REFERRING DIAG: I63.9 (ICD-10-CM) -  Cerebral infarction, unspecified   THERAPY DIAG:  Hemiplegia and hemiparesis following cerebral infarction affecting right dominant side (HCC)  Other lack of coordination  Muscle weakness (generalized)  Other disturbances of skin sensation  Rationale for Evaluation and Treatment Rehabilitation  SUBJECTIVE:   SUBJECTIVE STATEMENT: Pt reports ready for Vivistim assessment. Pt accompanied by: self  PERTINENT HISTORY: PMH: HTN, cervical radiculopathy, hepatitis, osteomyelitis/discitis, lumbar stenosis with foot drop anxiety, GERD, insomnia  PRECAUTIONS: Fall  WEIGHT BEARING RESTRICTIONS  was awaiting spinal fusion surgery, limit bending, twisting, lifting >10#  PAIN:  Are you having pain? No  FALLS: Has patient fallen in last 6 months?  Yes. Number of falls 2  LIVING ENVIRONMENT: Lives with: lives with their spouse Lives in: House/apartment Stairs: Yes: Internal: full flights of steps; on right going up and External: 7 steps; on left going up Has following equipment at home: Single point cane, Walker - 2 wheeled, Walker - 4 wheeled, shower chair, and bed side commode  PLOF: Independent  PATIENT GOALS I want my arm back.    OBJECTIVE:   HAND DOMINANCE: Right  ADLs: Transfers/ambulation related to ADLs: Mod I with no use of  Eating: Husband assists with cutting, feeding self with L hand Grooming: utilizing L hand with opening and brushing teeth UB Dressing: unable to manage buttons, otherwise Mod I LB Dressing: unable to tie shoes, just slipping on toes Toileting: Mod I with use of L hand Bathing: Mod I, utilizing L hand to shave Tub Shower transfers: Mod I Equipment: Shower seat with back, Walk in shower, and bed side commode   IADLs: Light housekeeping: laundry and hanging up clothes, wipes counters/sink, needs assist to fold clothing Meal Prep: no, did not cook pre-stroke Medication management: husband opens pill bottles Handwriting: unable  MOBILITY STATUS: Needs Assist: Supervision - Mod I without use of AD  POSTURE COMMENTS:  No Significant postural limitations  ACTIVITY TOLERANCE: Activity tolerance: WNL for tasks assessed on eval  FUNCTIONAL OUTCOME MEASURES: FOTO: 49  UPPER EXTREMITY ROM     Active ROM Right eval Left eval  Shoulder flexion WNL WNL  Shoulder abduction    Shoulder adduction    Shoulder extension    Shoulder internal rotation    Shoulder external rotation    Elbow flexion    Elbow extension -5 WNL  Wrist flexion 38 WNL  Wrist extension 50 WNL  Wrist ulnar deviation    Wrist radial deviation    Wrist pronation    Wrist supination    (Blank rows = not tested)   UPPER EXTREMITY MMT:     MMT Right eval Left eval  Shoulder flexion 4+/5   Shoulder abduction     Shoulder adduction    Shoulder extension    Shoulder internal rotation    Shoulder external rotation    Middle trapezius    Lower trapezius    Elbow flexion 4+/5   Elbow extension 4+/5   Wrist flexion    Wrist extension    Wrist ulnar deviation    Wrist radial deviation    Wrist pronation    Wrist supination    (Blank rows = not tested)  HAND FUNCTION: Loose gross grasp, unable to squeeze dynamometer 06/06/22: R: 15#  COORDINATION: Box and Blocks:  Right 10 blocks, Left 53 blocks Modified performance with RUE with therapist holding blocks in palm and pt able to complete gross grasp to pick up one at a time from OT palm of hand, minimal hand opening/closing 04/02/22: Right:  16 blocks  SENSATION: Light touch: WFL Stereognosis: Impaired  Hot/Cold: Impaired  Proprioception: Impaired   COGNITION: Overall cognitive status: Within functional limits for tasks assessed  VISION: Subjective report: Pt reports that her R eye does not close as well and that she feels that her L eye is compensating for her R eye. Baseline vision: Wears glasses all the time  VISION ASSESSMENT: To be further assessed in functional context  --------------------------------------------------------------------------------------------------------------------------------------------------------------------- (objective measures above completed at initial evaluation unless otherwise dated)  TODAY'S TREATMENT:  07/21/22 Bubba Camp: IADL Scale  A. Ability to Use Telephone  Operates telephone on own initiative-looks up and dials numbers, etc. - 1  B. Shopping Takes care of all shopping needs independently - 1  C. Food Preparation Plans, prepares and serves adequate meals independently - 1  D. Housekeeping Maintains house alone or with occasional assistance (e.g. "heavy work domestic help") - 1  E. Laundry  Does Doctor, hospital - 1  F. Mode of Transportation  Travels independently on public  transportation or drives own car - 1  G. Responsibility for Own Medications Is responsible for taking medication in correct dosages at correct time - 1  H. Ability to [employer] financial matters independently (budgets, writes checks, pays rent, bills, goes to [employer]), collects and keeps track of income - 1    TOTAL LAWTON BRODY SCORE: 8  -----------------------------------------------------------  Ron Parker Index of Independence in ADL INDEPENDENCE (1) DEPENDENCE (0)  NO supervision, direction or personal assistance WITH supervision, direction, personal assistance or total care   ACTIVITIES 1 or 0  Bathing 1  Dressing 1  Toileting 1  Transferring 1  Continence 1  Feeding 1    KATZ TOTAL: 6  -----------------------------------------------------------  FUGL-MEYER ASSESSMENT   A. Upper Extremity  I. Reflex Activity: Flexors (elbow) 2  Extensors (triceps) 2  Total 4/4     II. Volitional Movement within Synergies: Flexor Synergy:   Shoulder Retraction 2  Shoulder Elevation 2  Shoulder Abduction 2  External Rotation 2  Elbow Flexion 2  Forearm Supination 2  Extensor Synergy:   Shoulder Adduction/Internal Rotation 2  Elbow Extension 2  Forearm Pronation 1  Total 17/18   III. Volitional Mixing of Synergies: Hand to Lumbar Spine 2  Shoulder Flexion 2  Pronation/Supination 2  Total 6/6   IV. Volitional Movement with Little to No Synergy Shoulder Abduction 1  Shoulder Flexion 90+ 1  Pronation/Supination 1  Total 3/6    B. Wrist Stability of Wrist at 15* 2  Repeated Flex/Ext 1  Circumduction 2  Stability at 15*Wrist, 0*Elbow 2  Flexion/Extension - Elbow 0* 1  Total 8/10   C. Hand Mass Flexion 2  Mass Extension 1  Total 3/4    D. Grasp Hook  0  Thumb Adduction - paper 0  Pincer - pen 0  Cylindrical - cup 0  Spherical - ball 2  Total 2/10   E. Coordination/Speed Tremor 1  Dysmetria 2  Time 2  Total 5/6    FUGL-MEYER  ASSESSMENT: UPPER EXTREMITY A-E TOTAL: 48/64    ---------------------------------------------------------------------------------------------------------------------    07/18/22 Vivistim: pt asking questions about "vagus nerve stimulator" with OT answering within her scope and encouraging pt to check out website and ask further questions to her referring provider.  Discussed typical protocol with surgery and OT 3x/week for 90 mins sessions and need for OT to complete full assessment with Gregary Signs - plan to complete at next visit. Coordination: pt unable to maintain sustained grasp  to unscrew nuts/bolts.  Engaged in 9 hole peg test with pt dropping pegs frequently, successfully placing only 2 in the 2 min time limit after taking >45 seconds to get first peg in to hole.  Pt demonstrating improved ease with picking up pegs, however continues with decreased rotation and sustained pinch to place in holes. FOTO: 58 with improvement up from 50 at last assessment UEFS: 51.25% impairment.  Pt identifying areas of challenge, to be further addressed during future sessions and in new goal section. UE ROM: wrist flexion/extension, ulnar/radial deviation, and forearm supination/pronation completed with 1# dumbbell.  Pt reporting pain initially with supination, however with repetition decreased.  Discussed probability of spasticity impacting full ROM and onset of pain.    07/08/22 Coordination: picking up medium sized pegs with RUE with focus on precision pinch and placing in peg board with improved use of precision pinch.  Pt attempting to rotate pegs 180* with ability to complete 90-120* rotation and reporting increased tightness in fingers, limiting movement, with repetition.   Thumb flexion/extension: focus on thumb flexion at IP and [place] joint with easy squeeze ball.  OT providing demonstration for improved placement of ball to facilitate increased thumb flexion.  OT then challenging CMC extension with  resistance loop.  Pt able to complete CMC extension with mod difficulty, however unable to extend at IP joint. Wrist extension/flexion: with stacking/unstacking large grip legos. Transitioned to wrist flexion/extension with 1# dumbbell.  Pt demonstrating decreased supination to allow for full wrist flexion, therefore repositioned arm to allow for increased ROM. Forearm supination/pronation: complete with 1# dumbbell.  Pt with decreased supination, lacking ~20* from full supinated position.    07/03/22 Hand grip: pt brought in ball with finger loops that she purchased.  Pt asking questions about completion of exercises from previous session with this ball.  OT providing demonstration of full flexion/extension of thumb at IP and MP.  Ball allowing for good flexion.  Pt demonstrating mild difficulty with MP flexion, requiring hand over hand for increased technique. Medication management: Completed pill box assessment in 6:50 due to decreased use of RUE.  Pt able to utilize RUE to hold pill bottles while opening with LUE, pouring pills into L hand, and turning lids of pill bottles requiring push on tab as unable to complete pushing down with R thumb.   Built up handle: OT provided pt with built up/red foam handle and educated on placement on utensils and pens to increase functional use of loose grasp to allow for increased use of dominant RUE with self-feeding and to begin resuming handwriting.  Pt wrote 3 word sentence with RUE with built up handle, with 75% legibility but pt very pleased with ability to write with dominant hand. OT encouraged pt to start with coloring and writing large letters and shapes to focus on motor control and grip strength.    PATIENT EDUCATION: Education details: ongoing education with focus on coordination and progressive ROM, discussed Vivistim protocol Person educated: Patient Education method: Explanation, Demonstration, and Verbal cues Education comprehension: verbalized  understanding and needs further education   HOME EXERCISE PROGRAM: Fine motor coordination handout  Access Code: [email or web address] URL: [email or web address] Date: 06/27/2022 Prepared by: [facility]  Exercises - Seated Digit Tendon Gliding  - 2 x daily - 1 sets - 10 reps - Thumb AROM MP Blocking  - 2 x daily - 2 sets - 10 reps - Thumb Radial Adduction with Thumb Flexion AROM on Table  -  2 x daily - 2 sets - 10 reps - Wrist Prayer Stretch  - 2 x daily - 2 sets - 10 reps - 5-10 sec hold - Wrist Flexion and Extension with Resistance Bar  - 1 x daily - 3 x weekly - 2 sets - 10 reps - Wrist Extension with Resistance  - 1 x daily - 3 x weekly - 2 sets - 5 reps - 3 sec hold - Wrist Flexion with Resistance  - 1 x daily - 3 x weekly - 2 sets - 5 reps - 3 sec hold - Seated Wrist Radial Deviation with Dumbbell  - 1 x daily - 3 x weekly - 2 sets - 10 reps - Seated Wrist Extension with Dumbbell  - 1 x daily - 3 x weekly - 2 sets - 10 reps - Forearm Supination with Dumbbell  - 1 x daily - 3 x weekly - 2 sets - 10 reps      GOALS: Goals reviewed with patient? Yes   SHORT TERM GOALS: Target date: 08/08/22  1.  Pt will demonstrate improved external rotation and sustained grasp to complete grooming of hair with increased ease an independence.  Baseline:  Goal status: IN PROGRESS  2.   Pt will demonstrate improved UE functional use for ADLs as evidenced by increasing box/ blocks score by 4 blocks with RUE.  Baseline: R: 28 blocks and L: 53  Goal status: IN PROGRESS  3.   Pt will demonstrate improved functional use of RUE to allow for increased ease and independence with laundering (washing, folding, ironing) clothes.    Baseline:  Goal status: IN PROGRESS   LONG TERM GOALS: Target date: 08/29/22  1.  Pt will demonstrate use of dominant RUE as gross assist to aid in managing clothing fasteners (tying shoe laces and buttons). Baseline:  Goal  status: IN PROGRESS  2.  Pt will demonstrate improved coordination as needed to engage in self-feeding with dominant RUE with built up handle PRN as evidenced by decreasing PPT#2 (self feeding) by 3 secs Baseline: TBD Goal status: IN PROGRESS  3.  Pt will demonstrate increased functional reach and grasp to obtain or place moderate weight item from/on moderate height shelf to simulate IADLs. Baseline:  Goal status: IN PROGRESS  4. Pt will increase RUE pinch strength by 3# to increase success with holding and manipulating writing and/or eating utensil.  Baseline:   Goal status: IN PROGRESS  5.  Pt will be able to complete 9 hole peg test with RUE in 2 min time limit to demonstrate increased fine motor control as needed for ADLs/IADLs.  Baseline:   Goal status: IN PROGRESS   ASSESSMENT:  CLINICAL IMPRESSION: Patient is a 67 yr old female referred to OT for assessment to determine qualification for implantable nerve stimulator to address RUE functioning.  Completed Fugl Meyer UE assessment, as well as Ron Parker index of Independence in ADL, and Bubba Camp IADL Scale.  Patient has adequate active movement, cognition, and motivation throughout RUE with limitations in forearm pronation, sensation, wrist movement, strength, and thumb abduction.  Patient has excellent family support and has been involved in intense rehab programs in the past.  Patient is motivated to improve functional use and movement in her RUE arm.  Vivistim reps present during session and were able to answer pt questions about surgical procedure and expected therapy requirements to pt satisfaction.  Pt is at the high end of the qualification range and is only just approaching 6  months s/p CVA.  Pt will further discuss Vivistim with reps, spouse, and/or physician to discuss next steps.  Pt to continue working with OT with focus on pronation, supination, sensation, wrist movement, strength, and coordination in the  meantime.  PERFORMANCE DEFICITS in functional skills including ADLs, IADLs, coordination, dexterity, sensation, ROM, strength, FMC, GMC, balance, body mechanics, decreased knowledge of use of DME, and UE functional use and psychosocial skills including environmental adaptation, habits, and routines and behaviors.   IMPAIRMENTS are limiting patient from ADLs, IADLs, and work.   COMORBIDITIES may have co-morbidities  that affects occupational performance. Patient will benefit from skilled OT to address above impairments and improve overall function.  MODIFICATION OR ASSISTANCE TO COMPLETE EVALUATION: Min-Moderate modification of tasks or assist with assess necessary to complete an evaluation.  OT OCCUPATIONAL PROFILE AND HISTORY: Detailed assessment: Review of records and additional review of physical, cognitive, psychosocial history related to current functional performance.  CLINICAL DECISION MAKING: Moderate - several treatment options, min-mod task modification necessary  REHAB POTENTIAL: Good  EVALUATION COMPLEXITY: Moderate    PLAN: OT FREQUENCY: 1-2x/week  OT DURATION: 6 weeks  PLANNED INTERVENTIONS: self care/ADL training, therapeutic exercise, therapeutic activity, neuromuscular re-education, manual therapy, passive range of motion, balance training, functional mobility training, splinting, electrical stimulation, ultrasound, compression bandaging, moist heat, cryotherapy, patient/family education, psychosocial skills training, energy conservation, coping strategies training, and DME and/or AE instructions  RECOMMENDED OTHER SERVICES: N/A  CONSULTED AND AGREED WITH PLAN OF CARE: Patient and family member/caregiver  PLAN FOR NEXT SESSION: Will determine appropriateness of candidacy for Vivistim and reevaluate and develop plan upon return if determined appropriate.   Simonne Come, OTR/L 07/22/2022, 9:23 AM

## 2022-07-22 NOTE — Therapy (Deleted)
[facility] [address] [place], [place], [address] Phone: [phone]   Fax:  [phone]  Patient Details  Name: Chloe Johnson MRN: [id number] Date of Birth: 1956/05/25 Referring Provider:  Charlane Ferretti, MD  Encounter Date: 07/21/2022   Simonne Come, OT 07/22/2022, 9:23 AM  [facility] [address] [place], [place], [address] Phone: [phone]   Fax:  [phone]

## 2022-07-23 ENCOUNTER — Ambulatory Visit: Payer: Medicare Other | Admitting: Occupational Therapy

## 2022-07-23 ENCOUNTER — Ambulatory Visit: Payer: Medicare Other

## 2022-07-23 DIAGNOSIS — R208 Other disturbances of skin sensation: Secondary | ICD-10-CM

## 2022-07-23 DIAGNOSIS — R471 Dysarthria and anarthria: Secondary | ICD-10-CM | POA: Diagnosis not present

## 2022-07-23 DIAGNOSIS — R482 Apraxia: Secondary | ICD-10-CM | POA: Diagnosis not present

## 2022-07-23 DIAGNOSIS — I69351 Hemiplegia and hemiparesis following cerebral infarction affecting right dominant side: Secondary | ICD-10-CM | POA: Diagnosis not present

## 2022-07-23 DIAGNOSIS — R41841 Cognitive communication deficit: Secondary | ICD-10-CM

## 2022-07-23 DIAGNOSIS — R278 Other lack of coordination: Secondary | ICD-10-CM | POA: Diagnosis not present

## 2022-07-23 DIAGNOSIS — R4701 Aphasia: Secondary | ICD-10-CM

## 2022-07-23 DIAGNOSIS — M6281 Muscle weakness (generalized): Secondary | ICD-10-CM | POA: Diagnosis not present
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
## 2022-07-23 NOTE — Therapy (Signed)
OUTPATIENT OCCUPATIONAL THERAPY  Treatment Note   Patient Name: Chloe Johnson MRN: [id number] DOB:12-Aug-1955, 67 y.o., female Today's Date: 07/23/2022  PCP: Charlane Ferretti, MD REFERRING PROVIDER: Izora Ribas, MD      OT End of Session - 07/23/22 1205     Visit Number 34    Number of Visits 75    Date for OT Re-Evaluation 08/29/22    Authorization Type Medicare A&B    OT Start Time 1150    OT Stop Time 1232    OT Time Calculation (min) 42 min    Activity Tolerance Patient tolerated treatment well    Behavior During Therapy WFL for tasks assessed/performed                                     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/16/2013   L3-4/notes 11/24/2013, arms, neck   Exertional asthma    GERD (gastroesophageal reflux disease)    Heart attack ([place]) 01/18/2022   Hypertension    Kidney stones    "have always passed them"   Neck pain    Osteomyelitis ([place]) 11/23/2013   osteomyelitis, discitis    Sleep concern    uses Prozac for sleep    Stroke ([place]) 01/21/2022   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; left with aphasia, dysarthria, and apraxia   Past Surgical History:  Procedure Laterality Date   BREAST CYST EXCISION Left 2010   "polypectomy"   IR CT HEAD LTD  01/21/2022   IR CT HEAD LTD  01/28/2022   IR PERCUTANEOUS ART THROMBECTOMY/INFUSION INTRACRANIAL INC DIAG ANGIO  01/21/2022   IR PERCUTANEOUS ART THROMBECTOMY/INFUSION INTRACRANIAL INC DIAG ANGIO  01/28/2022   IR US GUIDE VASC ACCESS RIGHT  01/22/2022   PICC LINE PLACE PERIPHERAL ([place] [facility]) Right    for use of Levaquin & Vancomycin, of note: she reports that she had a "flulike feeling"    RADIOLOGY WITH ANESTHESIA N/A 01/21/2022   Procedure: IR WITH ANESTHESIA;  Surgeon: Luanne Bras, MD;  Location: [facility];  Service: Radiology;  Laterality: N/A;   RADIOLOGY WITH ANESTHESIA N/A 01/28/2022   Procedure: IR WITH ANESTHESIA;  Surgeon:  Radiologist, Medication, MD;  Location: [facility];  Service: Radiology;  Laterality: N/A;   [place]   Patient Active Problem List   Diagnosis Date Noted   Aphasia 03/31/2022   Snores 03/31/2022   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   UTI (urinary tract infection) 02/04/2022   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   Stroke determined by clinical assessment ([place]) 01/28/2022   Middle cerebral artery embolism, left 01/28/2022   H/O ischemic left MCA stroke    Acute respiratory failure (HCC)    CVA (cerebral vascular accident) ([place]) 01/21/2022   NSTEMI (non-ST elevated myocardial infarction) ([place]) 01/21/2022   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   HFrEF (heart failure with reduced ejection fraction) ([place]) 01/21/2022   Hyperlipidemia 01/21/2022   Lumbar stenosis 01/21/2022   Stroke (cerebrum) ([place]) 01/21/2022   S/P lumbar spinal fusion 11/15/2014   Lumbar discitis 11/29/2013   Discitis 11/24/2013   Diarrhea 11/17/2013   Osteomyelitis of lumbar spine ([place]) 11/16/2013   Hypertension    Asthma    Anxiety     ONSET DATE: 01/28/22  REFERRING DIAG: I63.9 (  ICD-10-CM) - Cerebral infarction, unspecified   THERAPY DIAG:  Hemiplegia and hemiparesis following cerebral infarction affecting right dominant side (HCC)  Other lack of coordination  Muscle weakness (generalized)  Other disturbances of skin sensation  Rationale for Evaluation and Treatment Rehabilitation  SUBJECTIVE:   SUBJECTIVE STATEMENT: Pt states that she wants to be able to wipe her butt and be able to use pincer grasp to change a diaper.  Pt accompanied by: self  PERTINENT HISTORY: PMH: HTN, cervical radiculopathy, hepatitis, osteomyelitis/discitis, lumbar stenosis with foot drop anxiety, GERD, insomnia  PRECAUTIONS: Fall  WEIGHT BEARING RESTRICTIONS  was awaiting spinal fusion surgery, limit bending, twisting, lifting >10#  PAIN:  Are  you having pain? No  FALLS: Has patient fallen in last 6 months? Yes. Number of falls 2  LIVING ENVIRONMENT: Lives with: lives with their spouse Lives in: House/apartment Stairs: Yes: Internal: full flights of steps; on right going up and External: 7 steps; on left going up Has following equipment at home: Single point cane, Walker - 2 wheeled, Walker - 4 wheeled, shower chair, and bed side commode  PLOF: Independent  PATIENT GOALS I want my arm back.    OBJECTIVE:   HAND DOMINANCE: Right  ADLs: Transfers/ambulation related to ADLs: Mod I with no use of  Eating: Husband assists with cutting, feeding self with L hand Grooming: utilizing L hand with opening and brushing teeth UB Dressing: unable to manage buttons, otherwise Mod I LB Dressing: unable to tie shoes, just slipping on toes Toileting: Mod I with use of L hand Bathing: Mod I, utilizing L hand to shave Tub Shower transfers: Mod I Equipment: Shower seat with back, Walk in shower, and bed side commode   IADLs: Light housekeeping: laundry and hanging up clothes, wipes counters/sink, needs assist to fold clothing Meal Prep: no, did not cook pre-stroke Medication management: husband opens pill bottles Handwriting: unable  MOBILITY STATUS: Needs Assist: Supervision - Mod I without use of AD  POSTURE COMMENTS:  No Significant postural limitations  ACTIVITY TOLERANCE: Activity tolerance: WNL for tasks assessed on eval  FUNCTIONAL OUTCOME MEASURES: FOTO: 49  UPPER EXTREMITY ROM     Active ROM Right eval Left eval  Shoulder flexion WNL WNL  Shoulder abduction    Shoulder adduction    Shoulder extension    Shoulder internal rotation    Shoulder external rotation    Elbow flexion    Elbow extension -5 WNL  Wrist flexion 38 WNL  Wrist extension 50 WNL  Wrist ulnar deviation    Wrist radial deviation    Wrist pronation    Wrist supination    (Blank rows = not tested)   UPPER EXTREMITY MMT:     MMT  Right eval Left eval  Shoulder flexion 4+/5   Shoulder abduction    Shoulder adduction    Shoulder extension    Shoulder internal rotation    Shoulder external rotation    Middle trapezius    Lower trapezius    Elbow flexion 4+/5   Elbow extension 4+/5   Wrist flexion    Wrist extension    Wrist ulnar deviation    Wrist radial deviation    Wrist pronation    Wrist supination    (Blank rows = not tested)  HAND FUNCTION: Loose gross grasp, unable to squeeze dynamometer 06/06/22: R: 15#  COORDINATION: Box and Blocks:  Right 10 blocks, Left 53 blocks Modified performance with RUE with therapist holding blocks in palm and pt able to  complete gross grasp to pick up one at a time from OT palm of hand, minimal hand opening/closing 04/02/22: Right: 16 blocks  SENSATION: Light touch: WFL Stereognosis: Impaired  Hot/Cold: Impaired  Proprioception: Impaired   COGNITION: Overall cognitive status: Within functional limits for tasks assessed  VISION: Subjective report: Pt reports that her R eye does not close as well and that she feels that her L eye is compensating for her R eye. Baseline vision: Wears glasses all the time  VISION ASSESSMENT: To be further assessed in functional context  --------------------------------------------------------------------------------------------------------------------------------------------------------------------- (objective measures above completed at initial evaluation unless otherwise dated)  TODAY'S TREATMENT:  07/23/22 Coordination: stacking and unstacking checkers with R hand.  Pt demonstrating loose pincer grasp with ability to pick up and unstack checkers.  OT modified task to picking up and stacking coins (plastic coins).  Pt with increased difficulty, but able to pick up quarter sized coins with RUE with increased effort and attempts.  OT then challenged pt to attempt to rotate coins in finger tips 180*, pt frequently dropping therefore  terminated task.  OT had pt place checker pieces and then coins into coin slot with RUE, incorporating partial rotation in finger tips.  Pt demonstrating improved ability to rotate 90* after massed practice.  OT encouraged pt to utilize coins, checkers, poker chips to challenge [facility] with precision pinch and rotation.   07/21/22 [place]: IADL Scale  A. Ability to Use Telephone  Operates telephone on own initiative-looks up and dials numbers, etc. - 1  B. Shopping Takes care of all shopping needs independently - 1  C. Food Preparation Plans, prepares and serves adequate meals independently - 1  D. Housekeeping Maintains house alone or with occasional assistance (e.g. "heavy work domestic help") - 1  E. Laundry  Does Doctor, hospital - 1  F. Mode of Transportation  Travels independently on public transportation or drives own car - 1  G. Responsibility for Own Medications Is responsible for taking medication in correct dosages at correct time - 1  H. Ability to [employer] financial matters independently (budgets, writes checks, pays rent, bills, goes to [employer]), collects and keeps track of income - 1    TOTAL LAWTON BRODY SCORE: 8  -----------------------------------------------------------  Ron Parker Index of Independence in ADL INDEPENDENCE (1) DEPENDENCE (0)  NO supervision, direction or personal assistance WITH supervision, direction, personal assistance or total care   ACTIVITIES 1 or 0  Bathing 1  Dressing 1  Toileting 1  Transferring 1  [place]ence 1  Feeding 1    KATZ TOTAL: 6  -----------------------------------------------------------  FUGL-MEYER ASSESSMENT   A. Upper Extremity  I. Reflex Activity: Flexors (elbow) 2  Extensors (triceps) 2  Total 4/4     II. Volitional Movement within Synergies: Flexor Synergy:   Shoulder Retraction 2  Shoulder Elevation 2  Shoulder Abduction 2  External Rotation 2  Elbow Flexion 2  Forearm Supination 2   Extensor Synergy:   Shoulder Adduction/Internal Rotation 2  Elbow Extension 2  Forearm Pronation 1  Total 17/18   III. Volitional Mixing of Synergies: Hand to Lumbar Spine 2  Shoulder Flexion 2  Pronation/Supination 2  Total 6/6   IV. Volitional Movement with Little to No Synergy Shoulder Abduction 1  Shoulder Flexion 90+ 1  Pronation/Supination 1  Total 3/6    B. Wrist Stability of Wrist at 15* 2  Repeated Flex/Ext 1  Circumduction 2  Stability at 15*Wrist, 0*Elbow 2  Flexion/Extension - Elbow 0* 1  Total  8/10   C. Hand Mass Flexion 2  Mass Extension 1  Total 3/4    D. Grasp Hook  0  Thumb Adduction - paper 0  Pincer - pen 0  Cylindrical - cup 0  Spherical - ball 2  Total 2/10   E. Coordination/Speed Tremor 1  Dysmetria 2  Time 2  Total 5/6    FUGL-MEYER ASSESSMENT: UPPER EXTREMITY A-E TOTAL: 48/64    ---------------------------------------------------------------------------------------------------------------------    07/18/22 Vivistim: pt asking questions about "vagus nerve stimulator" with OT answering within her scope and encouraging pt to check out website and ask further questions to her referring provider.  Discussed typical protocol with surgery and OT 3x/week for 90 mins sessions and need for OT to complete full assessment with Gregary Signs - plan to complete at next visit. Coordination: pt unable to maintain sustained grasp to unscrew nuts/bolts.  Engaged in 9 hole peg test with pt dropping pegs frequently, successfully placing only 2 in the 2 min time limit after taking >45 seconds to get first peg in to hole.  Pt demonstrating improved ease with picking up pegs, however continues with decreased rotation and sustained pinch to place in holes. FOTO: 58 with improvement up from 50 at last assessment UEFS: 51.25% impairment.  Pt identifying areas of challenge, to be further addressed during future sessions and in new goal section. UE ROM: wrist  flexion/extension, ulnar/radial deviation, and forearm supination/pronation completed with 1# dumbbell.  Pt reporting pain initially with supination, however with repetition decreased.  Discussed probability of spasticity impacting full ROM and onset of pain.   PATIENT EDUCATION: Education details: ongoing education with focus on coordination and progressive ROM, discussed Vivistim protocol Person educated: Patient Education method: Explanation, Demonstration, and Verbal cues Education comprehension: verbalized understanding and needs further education   HOME EXERCISE PROGRAM: Fine motor coordination handout  Access Code: [email or web address] URL: [email or web address] Date: 06/27/2022 Prepared by: [facility]  Exercises - Seated Digit Tendon Gliding  - 2 x daily - 1 sets - 10 reps - Thumb AROM MP Blocking  - 2 x daily - 2 sets - 10 reps - Thumb Radial Adduction with Thumb Flexion AROM on Table  - 2 x daily - 2 sets - 10 reps - Wrist Prayer Stretch  - 2 x daily - 2 sets - 10 reps - 5-10 sec hold - Wrist Flexion and Extension with Resistance Bar  - 1 x daily - 3 x weekly - 2 sets - 10 reps - Wrist Extension with Resistance  - 1 x daily - 3 x weekly - 2 sets - 5 reps - 3 sec hold - Wrist Flexion with Resistance  - 1 x daily - 3 x weekly - 2 sets - 5 reps - 3 sec hold - Seated Wrist Radial Deviation with Dumbbell  - 1 x daily - 3 x weekly - 2 sets - 10 reps - Seated Wrist Extension with Dumbbell  - 1 x daily - 3 x weekly - 2 sets - 10 reps - Forearm Supination with Dumbbell  - 1 x daily - 3 x weekly - 2 sets - 10 reps      GOALS: Goals reviewed with patient? Yes   SHORT TERM GOALS: Target date: 08/08/22  1.  Pt will demonstrate improved external rotation and sustained grasp to complete grooming of hair with increased ease an independence.  Baseline:  Goal status: IN PROGRESS  2.   Pt will demonstrate improved  UE functional use for ADLs as  evidenced by increasing box/ blocks score by 4 blocks with RUE.  Baseline: R: 28 blocks and L: 53  Goal status: IN PROGRESS  3.   Pt will demonstrate improved functional use of RUE to allow for increased ease and independence with laundering (washing, folding, ironing) clothes.    Baseline:  Goal status: IN PROGRESS   LONG TERM GOALS: Target date: 08/29/22  1.  Pt will demonstrate use of dominant RUE as gross assist to aid in managing clothing fasteners (tying shoe laces and buttons). Baseline:  Goal status: IN PROGRESS  2.  Pt will demonstrate improved coordination as needed to engage in self-feeding with dominant RUE with built up handle PRN as evidenced by decreasing PPT#2 (self feeding) by 3 secs Baseline: TBD Goal status: IN PROGRESS  3.  Pt will demonstrate increased functional reach and grasp to obtain or place moderate weight item from/on moderate height shelf to simulate IADLs. Baseline:  Goal status: IN PROGRESS  4. Pt will increase RUE pinch strength by 3# to increase success with holding and manipulating writing and/or eating utensil.  Baseline:   Goal status: IN PROGRESS  5.  Pt will be able to complete 9 hole peg test with RUE in 2 min time limit to demonstrate increased fine motor control as needed for ADLs/IADLs.  Baseline:   Goal status: IN PROGRESS   ASSESSMENT:  CLINICAL IMPRESSION: Reviewed results of Vivistim evaluations with Fugl-Meyer results and pt thoughts on how to proceed.  Pt expressing desire to continue with traditional OT at this time as pt is continuing to notice progress with functional use of RUE.  Discussed possibility of utilizing e-stim in conjunction with therapy sessions to further target wrist and finger movements.  Pt demonstrating improved ability to manipulate checker pieces and plastic coins, still requiring repetition and massed practice but able to pick up, stack, and place in coin slot with RUE with increased effort this  session.  PERFORMANCE DEFICITS in functional skills including ADLs, IADLs, coordination, dexterity, sensation, ROM, strength, FMC, GMC, balance, body mechanics, decreased knowledge of use of DME, and UE functional use and psychosocial skills including environmental adaptation, habits, and routines and behaviors.   IMPAIRMENTS are limiting patient from ADLs, IADLs, and work.   COMORBIDITIES may have co-morbidities  that affects occupational performance. Patient will benefit from skilled OT to address above impairments and improve overall function.  MODIFICATION OR ASSISTANCE TO COMPLETE EVALUATION: Min-Moderate modification of tasks or assist with assess necessary to complete an evaluation.  OT OCCUPATIONAL PROFILE AND HISTORY: Detailed assessment: Review of records and additional review of physical, cognitive, psychosocial history related to current functional performance.  CLINICAL DECISION MAKING: Moderate - several treatment options, min-mod task modification necessary  REHAB POTENTIAL: Good  EVALUATION COMPLEXITY: Moderate    PLAN: OT FREQUENCY: 1-2x/week  OT DURATION: 6 weeks  PLANNED INTERVENTIONS: self care/ADL training, therapeutic exercise, therapeutic activity, neuromuscular re-education, manual therapy, passive range of motion, balance training, functional mobility training, splinting, electrical stimulation, ultrasound, compression bandaging, moist heat, cryotherapy, patient/family education, psychosocial skills training, energy conservation, coping strategies training, and DME and/or AE instructions  RECOMMENDED OTHER SERVICES: N/A  CONSULTED AND AGREED WITH PLAN OF CARE: Patient and family member/caregiver  PLAN FOR NEXT SESSION: Coordination, wrist movements, functional reach, e-stim in conjunction with coordination/reach.   Simonne Come, OTR/L 07/23/2022, 12:29 PM

## 2022-07-28 ENCOUNTER — Ambulatory Visit: Payer: Medicare Other | Attending: Physical Medicine and Rehabilitation

## 2022-07-28 ENCOUNTER — Ambulatory Visit: Payer: Medicare Other | Admitting: Occupational Therapy

## 2022-07-28 DIAGNOSIS — R278 Other lack of coordination: Secondary | ICD-10-CM | POA: Insufficient documentation

## 2022-07-28 DIAGNOSIS — R208 Other disturbances of skin sensation: Secondary | ICD-10-CM | POA: Insufficient documentation

## 2022-07-28 DIAGNOSIS — M6281 Muscle weakness (generalized): Secondary | ICD-10-CM

## 2022-07-28 DIAGNOSIS — R41841 Cognitive communication deficit: Secondary | ICD-10-CM | POA: Insufficient documentation

## 2022-07-28 DIAGNOSIS — I69351 Hemiplegia and hemiparesis following cerebral infarction affecting right dominant side: Secondary | ICD-10-CM | POA: Insufficient documentation

## 2022-07-28 DIAGNOSIS — R482 Apraxia: Secondary | ICD-10-CM | POA: Diagnosis not present

## 2022-07-28 DIAGNOSIS — R471 Dysarthria and anarthria: Secondary | ICD-10-CM | POA: Diagnosis not present

## 2022-07-28 DIAGNOSIS — R4701 Aphasia: Secondary | ICD-10-CM | POA: Diagnosis not present

## 2022-07-28 NOTE — Patient Instructions (Signed)
spray scrape   Spread scratch    shrimp spring Scream   shrink sprinkle screw   shrub sprout straw    splash threat street  splat  threat strike   Splinter three string  splash thrill stripe   split

## 2022-07-28 NOTE — Therapy (Signed)
OUTPATIENT OCCUPATIONAL THERAPY  Treatment Note   Patient Name: Chloe Johnson MRN: [id number] DOB:[birth year], 67 y.o., female Today's Date: 07/28/2022  PCP: Charlane Ferretti, MD REFERRING PROVIDER: Izora Ribas, MD      OT End of Session - 07/28/22 1019     Visit Number 35    Number of Visits 39    Date for OT Re-Evaluation 08/29/22    Authorization Type Medicare A&B    OT Start Time 1016    OT Stop Time 1100    OT Time Calculation (min) 44 min    Activity Tolerance Patient tolerated treatment well    Behavior During Therapy WFL for tasks assessed/performed                                      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/16/2013   L3-4/notes 11/24/2013, arms, neck   Exertional asthma    GERD (gastroesophageal reflux disease)    Heart attack ([place]) 01/18/2022   Hypertension    Kidney stones    "have always passed them"   Neck pain    Osteomyelitis ([place]) 11/23/2013   osteomyelitis, discitis    Sleep concern    uses Prozac for sleep    Stroke ([place]) 01/21/2022   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; left with aphasia, dysarthria, and apraxia   Past Surgical History:  Procedure Laterality Date   BREAST CYST EXCISION Left 2010   "polypectomy"   IR CT HEAD LTD  01/21/2022   IR CT HEAD LTD  01/28/2022   IR PERCUTANEOUS ART THROMBECTOMY/INFUSION INTRACRANIAL INC DIAG ANGIO  01/21/2022   IR PERCUTANEOUS ART THROMBECTOMY/INFUSION INTRACRANIAL INC DIAG ANGIO  01/28/2022   IR US GUIDE VASC ACCESS RIGHT  01/22/2022   PICC LINE PLACE PERIPHERAL ([place] HX) Right    for use of Levaquin & Vancomycin, of note: she reports that she had a "flulike feeling"    RADIOLOGY WITH ANESTHESIA N/A 01/21/2022   Procedure: IR WITH ANESTHESIA;  Surgeon: Luanne Bras, MD;  Location: [place];  Service: Radiology;  Laterality: N/A;   RADIOLOGY WITH ANESTHESIA N/A 01/28/2022   Procedure: IR WITH ANESTHESIA;  Surgeon:  Radiologist, Medication, MD;  Location: [place];  Service: Radiology;  Laterality: N/A;   [place]   Patient Active Problem List   Diagnosis Date Noted   Aphasia 03/31/2022   Snores 03/31/2022   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   UTI (urinary tract infection) 02/04/2022   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   Stroke determined by clinical assessment ([place]) 01/28/2022   Middle cerebral artery embolism, left 01/28/2022   H/O ischemic left MCA stroke    Acute respiratory failure (HCC)    CVA (cerebral vascular accident) ([place]) 01/21/2022   NSTEMI (non-ST elevated myocardial infarction) ([place]) 01/21/2022   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   HFrEF (heart failure with reduced ejection fraction) ([name]) 01/21/2022   Hyperlipidemia 01/21/2022   Lumbar stenosis 01/21/2022   Stroke (cerebrum) ([place]) 01/21/2022   S/P lumbar spinal fusion 11/15/2014   Lumbar discitis 11/29/2013   Discitis 11/24/2013   Diarrhea 11/17/2013   Osteomyelitis of lumbar spine ([place]) 11/16/2013   Hypertension    Asthma    Anxiety     ONSET DATE: 01/28/22  REFERRING DIAG:  I63.9 (ICD-10-CM) - Cerebral infarction, unspecified   THERAPY DIAG:  Hemiplegia and hemiparesis following cerebral infarction affecting right dominant side (HCC)  Other lack of coordination  Muscle weakness (generalized)  Other disturbances of skin sensation  Rationale for Evaluation and Treatment Rehabilitation  SUBJECTIVE:   SUBJECTIVE STATEMENT: Pt reports that her grand babies were at her house yesterday. Pt accompanied by: self  PERTINENT HISTORY: PMH: HTN, cervical radiculopathy, hepatitis, osteomyelitis/discitis, lumbar stenosis with foot drop anxiety, GERD, insomnia  PRECAUTIONS: Fall  WEIGHT BEARING RESTRICTIONS  was awaiting spinal fusion surgery, limit bending, twisting, lifting >10#  PAIN:  Are you having pain? No  FALLS: Has patient  fallen in last 6 months? Yes. Number of falls 2  LIVING ENVIRONMENT: Lives with: lives with their spouse Lives in: House/apartment Stairs: Yes: Internal: full flights of steps; on right going up and External: 7 steps; on left going up Has following equipment at home: Single point cane, Walker - 2 wheeled, Walker - 4 wheeled, shower chair, and bed side commode  PLOF: Independent  PATIENT GOALS I want my arm back.    OBJECTIVE:   HAND DOMINANCE: Right  ADLs: Transfers/ambulation related to ADLs: Mod I with no use of  Eating: Husband assists with cutting, feeding self with L hand Grooming: utilizing L hand with opening and brushing teeth UB Dressing: unable to manage buttons, otherwise Mod I LB Dressing: unable to tie shoes, just slipping on toes Toileting: Mod I with use of L hand Bathing: Mod I, utilizing L hand to shave Tub Shower transfers: Mod I Equipment: Shower seat with back, Walk in shower, and bed side commode   IADLs: Light housekeeping: laundry and hanging up clothes, wipes counters/sink, needs assist to fold clothing Meal Prep: no, did not cook pre-stroke Medication management: husband opens pill bottles Handwriting: unable  MOBILITY STATUS: Needs Assist: Supervision - Mod I without use of AD  POSTURE COMMENTS:  No Significant postural limitations  ACTIVITY TOLERANCE: Activity tolerance: WNL for tasks assessed on eval  FUNCTIONAL OUTCOME MEASURES: FOTO: 49  UPPER EXTREMITY ROM     Active ROM Right eval Left eval  Shoulder flexion WNL WNL  Shoulder abduction    Shoulder adduction    Shoulder extension    Shoulder internal rotation    Shoulder external rotation    Elbow flexion    Elbow extension -5 WNL  Wrist flexion 38 WNL  Wrist extension 50 WNL  Wrist ulnar deviation    Wrist radial deviation    Wrist pronation    Wrist supination    (Blank rows = not tested)   UPPER EXTREMITY MMT:     MMT Right eval Left eval  Shoulder flexion 4+/5    Shoulder abduction    Shoulder adduction    Shoulder extension    Shoulder internal rotation    Shoulder external rotation    Middle trapezius    Lower trapezius    Elbow flexion 4+/5   Elbow extension 4+/5   Wrist flexion    Wrist extension    Wrist ulnar deviation    Wrist radial deviation    Wrist pronation    Wrist supination    (Blank rows = not tested)  HAND FUNCTION: Loose gross grasp, unable to squeeze dynamometer 06/06/22: R: 15#  COORDINATION: Box and Blocks:  Right 10 blocks, Left 53 blocks Modified performance with RUE with therapist holding blocks in palm and pt able to complete gross grasp to pick up one at a time from OT  palm of hand, minimal hand opening/closing 04/02/22: Right: 16 blocks  SENSATION: Light touch: WFL Stereognosis: Impaired  Hot/Cold: Impaired  Proprioception: Impaired   COGNITION: Overall cognitive status: Within functional limits for tasks assessed  VISION: Subjective report: Pt reports that her R eye does not close as well and that she feels that her L eye is compensating for her R eye. Baseline vision: Wears glasses all the time  VISION ASSESSMENT: To be further assessed in functional context  --------------------------------------------------------------------------------------------------------------------------------------------------------------------- (objective measures above completed at initial evaluation unless otherwise dated)  TODAY'S TREATMENT:  07/28/22 Wrist movements: Circumduction with forearm propped on pillow to facilitate increased ROM, completed clockwise and counter-clockwise.  Engaged in ulnar/radial deviation with 1# dumbbell.   Functional reach with grasp: engaged in retrieving sticky notes from vertical surface to challenge increased shoulder flexion as well as lateral and precision grip.  Incorporated reaching outside [place] and across midline to challenge balance in conjunction with shoulder ROM and grasp.  OT  providing min cues for technique with reaching to challenge more functional reach and not internal rotation.  Pt reports mild pain (3/10) in R shoulder with repetition with overhead reaching.  Seated functional reach: engaged in picking up and placing bean bags on matching spots to facilitate increased grasp and functional reach.  Pt reports no pain in horizontal plane, compared to when completing standing task with reaching in vertical plane.  Incorporated supination into task with forearm supination to place bean bags, pt requiring mod demonstration and verbal cues for technique.  Pt demonstrating ~2/3 supination, therefore encouraged pt to attempt with LUE to better understand movement, then resumed completion with RUE with much improved technique.      07/23/22 Coordination: stacking and unstacking checkers with R hand.  Pt demonstrating loose pincer grasp with ability to pick up and unstack checkers.  OT modified task to picking up and stacking coins (plastic coins).  Pt with increased difficulty, but able to pick up quarter sized coins with RUE with increased effort and attempts.  OT then challenged pt to attempt to rotate coins in finger tips 180*, pt frequently dropping therefore terminated task.  OT had pt place checker pieces and then coins into coin slot with RUE, incorporating partial rotation in finger tips.  Pt demonstrating improved ability to rotate 90* after massed practice.  OT encouraged pt to utilize coins, checkers, poker chips to challenge [facility] with precision pinch and rotation.   07/21/22 [place]: IADL Scale  A. Ability to Use Telephone  Operates telephone on own initiative-looks up and dials numbers, etc. - 1  B. Shopping Takes care of all shopping needs independently - 1  C. Food Preparation Plans, prepares and serves adequate meals independently - 1  D. Housekeeping Maintains house alone or with occasional assistance (e.g. "heavy work domestic help") - 1  E.  Laundry  Does Doctor, hospital - 1  F. Mode of Transportation  Travels independently on public transportation or drives own car - 1  G. Responsibility for Own Medications Is responsible for taking medication in correct dosages at correct time - 1  H. Ability to [employer] financial matters independently (budgets, writes checks, pays rent, bills, goes to [employer]), collects and keeps track of income - 1    TOTAL LAWTON BRODY SCORE: 8  -----------------------------------------------------------  Ron Parker Index of Independence in ADL INDEPENDENCE (1) DEPENDENCE (0)  NO supervision, direction or personal assistance WITH supervision, direction, personal assistance or total care   ACTIVITIES 1 or 0  Bathing 1  Dressing 1  Toileting 1  Transferring 1  [place] 1  Feeding 1    KATZ TOTAL: 6  -----------------------------------------------------------  FUGL-MEYER ASSESSMENT   A. Upper Extremity  I. Reflex Activity: Flexors (elbow) 2  Extensors (triceps) 2  Total 4/4     II. Volitional Movement within Synergies: Flexor Synergy:   Shoulder Retraction 2  Shoulder Elevation 2  Shoulder Abduction 2  External Rotation 2  Elbow Flexion 2  Forearm Supination 2  Extensor Synergy:   Shoulder Adduction/Internal Rotation 2  Elbow Extension 2  Forearm Pronation 1  Total 17/18   III. Volitional Mixing of Synergies: Hand to Lumbar Spine 2  Shoulder Flexion 2  Pronation/Supination 2  Total 6/6   IV. Volitional Movement with Little to No Synergy Shoulder Abduction 1  Shoulder Flexion 90+ 1  Pronation/Supination 1  Total 3/6    B. Wrist Stability of Wrist at 15* 2  Repeated Flex/Ext 1  Circumduction 2  Stability at 15*Wrist, 0*Elbow 2  Flexion/Extension - Elbow 0* 1  Total 8/10   C. Hand Mass Flexion 2  Mass Extension 1  Total 3/4    D. Grasp Hook  0  Thumb Adduction - paper 0  Pincer - pen 0  Cylindrical - cup 0  Spherical - ball 2  Total 2/10    E. Coordination/Speed Tremor 1  Dysmetria 2  Time 2  Total 5/6    FUGL-MEYER ASSESSMENT: UPPER EXTREMITY A-E TOTAL: 48/64      PATIENT EDUCATION: Education details: ongoing education with focus on coordination and progressive ROM Person educated: Patient Education method: Explanation, Demonstration, and Verbal cues Education comprehension: verbalized understanding and needs further education   HOME EXERCISE PROGRAM: Fine motor coordination handout  Access Code: [email or web address] URL: [email or web address] Date: 06/27/2022 Prepared by: [facility]  Exercises - Seated Digit Tendon Gliding  - 2 x daily - 1 sets - 10 reps - Thumb AROM MP Blocking  - 2 x daily - 2 sets - 10 reps - Thumb Radial Adduction with Thumb Flexion AROM on Table  - 2 x daily - 2 sets - 10 reps - Wrist Prayer Stretch  - 2 x daily - 2 sets - 10 reps - 5-10 sec hold - Wrist Flexion and Extension with Resistance Bar  - 1 x daily - 3 x weekly - 2 sets - 10 reps - Wrist Extension with Resistance  - 1 x daily - 3 x weekly - 2 sets - 5 reps - 3 sec hold - Wrist Flexion with Resistance  - 1 x daily - 3 x weekly - 2 sets - 5 reps - 3 sec hold - Seated Wrist Radial Deviation with Dumbbell  - 1 x daily - 3 x weekly - 2 sets - 10 reps - Seated Wrist Extension with Dumbbell  - 1 x daily - 3 x weekly - 2 sets - 10 reps - Forearm Supination with Dumbbell  - 1 x daily - 3 x weekly - 2 sets - 10 reps      GOALS: Goals reviewed with patient? Yes   SHORT TERM GOALS: Target date: 08/08/22  1.  Pt will demonstrate improved external rotation and sustained grasp to complete grooming of hair with increased ease an independence.  Baseline:  Goal status: IN PROGRESS  2.   Pt will demonstrate improved UE functional use for ADLs as evidenced by increasing box/ blocks score by 4 blocks with RUE.  Baseline: R: 28 blocks and L: 53  Goal status: IN PROGRESS  3.   Pt will  demonstrate improved functional use of RUE to allow for increased ease and independence with laundering (washing, folding, ironing) clothes.    Baseline:  Goal status: IN PROGRESS   LONG TERM GOALS: Target date: 08/29/22  1.  Pt will demonstrate use of dominant RUE as gross assist to aid in managing clothing fasteners (tying shoe laces and buttons). Baseline:  Goal status: IN PROGRESS  2.  Pt will demonstrate improved coordination as needed to engage in self-feeding with dominant RUE with built up handle PRN as evidenced by decreasing PPT#2 (self feeding) by 3 secs Baseline: TBD Goal status: IN PROGRESS  3.  Pt will demonstrate increased functional reach and grasp to obtain or place moderate weight item from/on moderate height shelf to simulate IADLs. Baseline:  Goal status: IN PROGRESS  4. Pt will increase RUE pinch strength by 3# to increase success with holding and manipulating writing and/or eating utensil.  Baseline:   Goal status: IN PROGRESS  5.  Pt will be able to complete 9 hole peg test with RUE in 2 min time limit to demonstrate increased fine motor control as needed for ADLs/IADLs.  Baseline:   Goal status: IN PROGRESS   ASSESSMENT:  CLINICAL IMPRESSION: Pt continues to demonstrate mild decrease in radial deviation as well as supination, impacting ease with engagement in and completion of functional reach and grasp activities this session.  Pt benefiting from demonstration and use of LUE to simulate task with improved carryover afterwards.   PERFORMANCE DEFICITS in functional skills including ADLs, IADLs, coordination, dexterity, sensation, ROM, strength, FMC, GMC, balance, body mechanics, decreased knowledge of use of DME, and UE functional use and psychosocial skills including environmental adaptation, habits, and routines and behaviors.   IMPAIRMENTS are limiting patient from ADLs, IADLs, and work.   COMORBIDITIES may have co-morbidities  that affects occupational  performance. Patient will benefit from skilled OT to address above impairments and improve overall function.  MODIFICATION OR ASSISTANCE TO COMPLETE EVALUATION: Min-Moderate modification of tasks or assist with assess necessary to complete an evaluation.  OT OCCUPATIONAL PROFILE AND HISTORY: Detailed assessment: Review of records and additional review of physical, cognitive, psychosocial history related to current functional performance.  CLINICAL DECISION MAKING: Moderate - several treatment options, min-mod task modification necessary  REHAB POTENTIAL: Good  EVALUATION COMPLEXITY: Moderate    PLAN: OT FREQUENCY: 1-2x/week  OT DURATION: 6 weeks  PLANNED INTERVENTIONS: self care/ADL training, therapeutic exercise, therapeutic activity, neuromuscular re-education, manual therapy, passive range of motion, balance training, functional mobility training, splinting, electrical stimulation, ultrasound, compression bandaging, moist heat, cryotherapy, patient/family education, psychosocial skills training, energy conservation, coping strategies training, and DME and/or AE instructions  RECOMMENDED OTHER SERVICES: N/A  CONSULTED AND AGREED WITH PLAN OF CARE: Patient and family member/caregiver  PLAN FOR NEXT SESSION: Coordination, wrist movements, functional reach, e-stim in conjunction with coordination/reach.   Demarri Elie, [place], OTR/L 07/28/2022, 10:20 AM
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
## 2022-07-30 ENCOUNTER — Ambulatory Visit: Payer: Medicare Other

## 2022-07-30 DIAGNOSIS — I69351 Hemiplegia and hemiparesis following cerebral infarction affecting right dominant side: Secondary | ICD-10-CM | POA: Diagnosis not present

## 2022-07-30 DIAGNOSIS — R471 Dysarthria and anarthria: Secondary | ICD-10-CM | POA: Diagnosis not present

## 2022-07-30 DIAGNOSIS — M6281 Muscle weakness (generalized): Secondary | ICD-10-CM | POA: Diagnosis not present

## 2022-07-30 DIAGNOSIS — R482 Apraxia: Secondary | ICD-10-CM

## 2022-07-30 DIAGNOSIS — R41841 Cognitive communication deficit: Secondary | ICD-10-CM | POA: Diagnosis not present

## 2022-07-30 DIAGNOSIS — R208 Other disturbances of skin sensation: Secondary | ICD-10-CM | POA: Diagnosis not present

## 2022-07-30 DIAGNOSIS — R4701 Aphasia: Secondary | ICD-10-CM | POA: Diagnosis not present

## 2022-07-30 DIAGNOSIS — R278 Other lack of coordination: Secondary | ICD-10-CM | POA: Diagnosis not present
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
## 2022-07-31 ENCOUNTER — Ambulatory Visit: Payer: Medicare Other | Attending: Cardiovascular Disease | Admitting: *Deleted

## 2022-07-31 DIAGNOSIS — I639 Cerebral infarction, unspecified: Secondary | ICD-10-CM | POA: Diagnosis not present

## 2022-07-31 DIAGNOSIS — I513 Intracardiac thrombosis, not elsewhere classified: Secondary | ICD-10-CM | POA: Diagnosis not present

## 2022-07-31 DIAGNOSIS — Z5181 Encounter for therapeutic drug level monitoring: Secondary | ICD-10-CM

## 2022-07-31 LAB — POCT INR: INR: 1.9 — AB (ref 2.0–3.0)

## 2022-07-31 NOTE — Patient Instructions (Signed)
Description   Today take 1.5 tablets then continue taking warfarin 1 tablet daily. Continue green leafy veggie to weekly. Recheck INR in 3 weeks.  [facility] [phone]

## 2022-08-04 ENCOUNTER — Ambulatory Visit: Payer: Medicare Other | Admitting: Occupational Therapy

## 2022-08-04 ENCOUNTER — Ambulatory Visit (INDEPENDENT_AMBULATORY_CARE_PROVIDER_SITE_OTHER): Payer: Medicare Other | Admitting: Neurology

## 2022-08-04 ENCOUNTER — Encounter: Payer: Self-pay | Admitting: Neurology

## 2022-08-04 VITALS — BP 123/76 | HR 62 | Ht 60.0 in | Wt 151.5 lb

## 2022-08-04 DIAGNOSIS — R4701 Aphasia: Secondary | ICD-10-CM

## 2022-08-04 DIAGNOSIS — R41841 Cognitive communication deficit: Secondary | ICD-10-CM | POA: Diagnosis not present

## 2022-08-04 DIAGNOSIS — I63132 Cerebral infarction due to embolism of left carotid artery: Secondary | ICD-10-CM

## 2022-08-04 DIAGNOSIS — R471 Dysarthria and anarthria: Secondary | ICD-10-CM | POA: Diagnosis not present

## 2022-08-04 DIAGNOSIS — M6281 Muscle weakness (generalized): Secondary | ICD-10-CM

## 2022-08-04 DIAGNOSIS — R278 Other lack of coordination: Secondary | ICD-10-CM | POA: Diagnosis not present

## 2022-08-04 DIAGNOSIS — R208 Other disturbances of skin sensation: Secondary | ICD-10-CM

## 2022-08-04 DIAGNOSIS — R482 Apraxia: Secondary | ICD-10-CM | POA: Diagnosis not present

## 2022-08-04 DIAGNOSIS — I69351 Hemiplegia and hemiparesis following cerebral infarction affecting right dominant side: Secondary | ICD-10-CM | POA: Diagnosis not present

## 2022-08-04 NOTE — Progress Notes (Signed)
Chief Complaint  Patient presents with   Follow-up    Rm 14. Alone. Reports trouble with three fingers on right hand.      ASSESSMENT AND PLAN  Chloe Johnson is a 67 y.o. 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, heart attack  First stroke on January 21, 2022 following heart attack full recovery after mechanical endovascular thrombectomy,  Recurrent second stroke while taking Eliquis on January 28, 2022, now on Coumadin,   MRI of the brain on April 11, 2022 showed chronic left posterior frontal/parietal stroke,  Reported transient worsening language difficulty, slowed thinking, the stroke was called, at risk for partial seizure,   EEG  At risk for obstructive sleep apnea  Sleep study pending in April 2024,  Return To Clinic With NP In 4 Months   DIAGNOSTIC DATA (LABS, IMAGING, TESTING) - I reviewed patient records, labs, notes, testing and imaging myself where available.  INR 2.7 on Nov 1, normal B12, TSH, ANA, hypercoagulable status,  BMP   MEDICAL HISTORY:  Chloe Johnson is a 67 year old right-handed female, accompanied by her husband, seen in request by her primary care Chloe Johnson, Chloe Johnson, for evaluation of stroke, initial evaluation March 31, 2022  I reviewed and summarized the referring note. PMHX. HTN HLD Urinary Urgency,  CAD  She works as a Scientist, physiological, work from home, January 16, 2022 she was traveling at a slight monitoring, noticed dizziness, then diarrhea, vomiting, she was still able to push herself to work,  First stroke on January 21, 2022, she had sudden onset right hand weakness, dropped the plate, transient speech difficulty lasting for 30 minutes, husband also noted mild right facial droop, symptoms were transient, she was able to bounce back to baseline, also found she has MI, troponin was 341  Personally reviewed MRI of the brain without contrast 11:50 AM, small acute infarction at the  left frontal [place] matter  Same evening, after she was admitted, around 6 PM, was noted to have word finding difficulty  CT angiogram of head and neck showed occluded left M2 MCA, perfusion identified critically hypoperfused parenchyma within the left insular/frontal lobe, reported mismatch volume 7 mm  She had a successful mechanical thrombectomy for distal M2 MCA occlusion with direct contact aspiration achieving complete recanalization,  When she wake up from anesthesia, she denies focal symptoms  Repeat MRI of the brain January 22, 2022 showed no punctuated acute infarction in the left external capsule, insula, frontal lobe cortex, and previous noted left frontal lobe acute infarction  Laboratory evaluation normal CBC, INR, negative HIV, A1c, LDL 150, hemoglobin of 12  Significantly elevated troponin up to 341  Echocardiogram January 21, 2022 showed:  regional wall motion abnormality, apical hypokinesis Left ventricular thrombosis, 1.84 x 0.9 x 1.2 cm  She was discharged with Eliquis with no focal deficit,  Second stroke on on January 28, 2022, when she was taking Eliquis,  she was checking her husband's blood pressure, she suddenly slumped over, developed language difficulty right-sided weakness, was taken to hospital by ambulance  Repeat MRI/A of the brain showed new acute infarction in the left posterior frontal anterior parietal cortex, left MCA branch appear perfused and without focal stenosis Repeat IR procedure, January 28, 2022 endovascular revascularization of occluded division of left MCA achieving a TICI 2C revascularization of the left MCA distribution, she was intubated due to poor responsiveness following reversal of anesthesia  She insists she came around from intubation  and anesthesia, she had a persistent deficit, aphasia and word finding difficulties, no comprehensive difficulty, also has right arm more than left weakness, over the past 2 months, she had some  progression, not back to baseline yet  More extensive laboratory evaluation UDS was positive for marijuana, hypercoagulable status was negative, negative ANA, RPR, normal B12, TSH, was treated with IV heparin now on Coumadin  UPDATE August 04 2022: She continues to improve, but she continues to feel right hand is clumsy, feeling is coming back, expressive aphasia, no comprehensive trouble, it takes a while to get her words out, difficulty spelling,   She has one episode on March 8th, she was playing a game on computer, word puzzle, she was trying to spell a word, she could not spell INDIGO, did not pass out,  not confused, take her 30 minutes to figure it out,  Personally reviewed MRI of the brain from November 2023, chronic left posterior frontal/parietal stroke, no new lesion, she works as a Occupational psychologist, is on disability, independent in daily activity now  PHYSICAL EXAM:   Vitals:   08/04/22 1334  BP: 123/76  Pulse: 62  Weight: 151 lb 8 oz (68.7 kg)  Height: 5' (1.524 m)   Body mass index is 29.59 kg/m.  PHYSICAL EXAMNIATION:  Gen: NAD, conversant, well nourised, well groomed                     Cardiovascular: Regular rate rhythm, no peripheral edema, warm, nontender. Eyes: Conjunctivae clear without exudates or hemorrhage Neck: Supple, no carotid bruits. Pulmonary: Clear to auscultation bilaterally   NEUROLOGICAL EXAM:  MENTAL STATUS: Speech/cognition: Awake, oriented to history taking, normal comprehension, expressive aphasia, word finding difficulties, paraphasic errors, mild dysarthria,   CRANIAL NERVES: CN II: Visual fields are full to confrontation. Pupils are round equal and briskly reactive to light. CN III, IV, VI: extraocular movement are normal. No ptosis. CN V: Facial sensation is intact to light touch CN VII: Right lower face weakness CN VIII: Hearing is normal to causal conversation. CN IX, X: Phonation is normal. CN XI: Head turning and shoulder  shrug are intact CN XII: Narrow long pharyngeal space  MOTOR: Fixation of right arm on rapid rotating movement, mild right leg drift  REFLEXES: Mild hyperreflexia of right upper and lower extremity  SENSORY: Intact to light touch, pinprick and vibratory sensation are intact in fingers and toes.  COORDINATION: There is no trunk or limb dysmetria noted.  GAIT/STANCE: Steady,  REVIEW OF SYSTEMS:  Full 14 system review of systems performed and notable only for as above All other review of systems were negative.   ALLERGIES: Allergies  Allergen Reactions   Doxycycline Nausea Only    malaise    Erythromycin Nausea And Vomiting   Iodine     unknown   Vancomycin    Vicodin [Hydrocodone-Acetaminophen] Other (See Comments)    Restless, tolerate percocet   Dilaudid [Hydromorphone Hcl] Rash    *Pt states she can take if given benadryl prior*   Keflex [Cephalexin] Rash   Penicillins Rash    Hives   Sulfa Antibiotics Rash    Hives    HOME MEDICATIONS: Current Outpatient Medications  Medication Sig Dispense Refill   acetaminophen (TYLENOL) 325 MG tablet Take 2 tablets (650 mg total) by mouth every 4 (four) hours as needed for mild pain (or temp > 37.5 C (99.5 F)). (Patient taking differently: Take 650 mg by mouth 2 (two) times daily.)  cyclobenzaprine (FLEXERIL) 10 MG tablet Take 1 tablet (10 mg total) by mouth at bedtime. 30 tablet 3   famotidine (PEPCID) 20 MG tablet Take 1 tablet (20 mg total) by mouth 2 (two) times daily. 180 tablet 3   loratadine (CLARITIN) 10 MG tablet Take 1 tablet (10 mg total) by mouth daily.     metoprolol succinate (TOPROL-XL) 25 MG 24 hr tablet Take 0.5 tablets (12.5 mg total) by mouth daily. 45 tablet 3   mirabegron ER (MYRBETRIQ) 50 MG TB24 tablet Take 25 mg by mouth daily.     naltrexone (DEPADE) 50 MG tablet Take 50 mg by mouth daily.     potassium chloride (KLOR-CON) 10 MEQ tablet Take 2 tablets by mouth twice daily 360 tablet 2   pregabalin  (LYRICA) 150 MG capsule Take 1 capsule (150 mg total) by mouth 2 (two) times daily. 180 capsule 3   rosuvastatin (CRESTOR) 20 MG tablet Take 1 tablet (20 mg total) by mouth daily. 90 tablet 3   sacubitril-valsartan (ENTRESTO) 24-26 MG Take 1 tablet by mouth 2 (two) times daily. 180 tablet 3   topiramate (TOPAMAX) 50 MG tablet Take 1 tablet (50 mg total) by mouth 2 (two) times daily. 90 tablet 3   warfarin (COUMADIN) 4 MG tablet Take 1 tablet (4 mg total) by mouth daily. 30 tablet 11   No current facility-administered medications for this visit.    PAST MEDICAL HISTORY: 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/16/2013   L3-4/notes 11/24/2013, arms, neck   Exertional asthma    GERD (gastroesophageal reflux disease)    Heart attack ([place]) 01/18/2022   Hypertension    Kidney stones    "have always passed them"   Neck pain    Osteomyelitis ([facility]) 11/23/2013   osteomyelitis, discitis    Sleep concern    uses Prozac for sleep    Stroke ([place]) 01/21/2022   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; left with aphasia, dysarthria, and apraxia    PAST SURGICAL HISTORY: Past Surgical History:  Procedure Laterality Date   BREAST CYST EXCISION Left 2010   "polypectomy"   IR CT HEAD LTD  01/21/2022   IR CT HEAD LTD  01/28/2022   IR PERCUTANEOUS ART THROMBECTOMY/INFUSION INTRACRANIAL INC DIAG ANGIO  01/21/2022   IR PERCUTANEOUS ART THROMBECTOMY/INFUSION INTRACRANIAL INC DIAG ANGIO  01/28/2022   IR US GUIDE VASC ACCESS RIGHT  01/22/2022   PICC LINE PLACE PERIPHERAL ([place] [facility]) Right    for use of Levaquin & Vancomycin, of note: she reports that she had a "flulike feeling"    RADIOLOGY WITH ANESTHESIA N/A 01/21/2022   Procedure: IR WITH ANESTHESIA;  Surgeon: Luanne Bras, MD;  Location: [facility];  Service: Radiology;  Laterality: N/A;   RADIOLOGY WITH ANESTHESIA N/A 01/28/2022   Procedure: IR WITH ANESTHESIA;  Surgeon: Radiologist, Medication, MD;  Location: [facility];  Service: Radiology;  Laterality: N/A;   TONSILLECTOMY AND ADENOIDECTOMY  1975   TUBAL LIGATION  1999    FAMILY HISTORY: Family History  Problem Relation Age of Onset   Other Mother    Other Father    Hypertension Father    Cancer Sister    Sleep apnea Sister     SOCIAL HISTORY: Social History   Socioeconomic History   Marital status: Married    Spouse name: Chloe Johnson   Number of children: 3   Years of education: Therapist, sports   Highest  education level: Not on file  Occupational History    Employer: [employer]   Tobacco Use   Smoking status: Never    Passive exposure: Past   Smokeless tobacco: Never  Vaping Use   Vaping Use: Never used  Substance and Sexual Activity   Alcohol use: Yes    Alcohol/week: 2.0 standard drinks of alcohol    Types: 2 Shots of liquor per week    Comment: "a little"; 1/2 a shot x 3 per week   Drug use: No    Comment: takes CBD gummies for sleep and anxiety   Sexual activity: Yes    Birth control/protection: Post-menopausal  Other Topics Concern   Not on file  Social History Narrative   Patient is married and lives at home with her husband Chloe Johnson).  Patient works full time as a Therapist, sports in [employer] (currently out of work on short term disability since her stroke and heart attack). Right handed.College education.         Social Determinants of Health   Financial Resource Strain: Not on file  Food Insecurity: Not on file  Transportation Needs: Not on file  Physical Activity: Not on file  Stress: Not on file  Social Connections: Not on file  Intimate Partner Violence: Not on file      Marcial Pacas, M.D. Ph.D.  [facility] [address], [place], [place] [address] Ph: [phone] Fax: [phone]  CC:  Chloe Ferretti, MD [address] [place],  [place] [address]  Chloe Ferretti, MD

## 2022-08-04 NOTE — Therapy (Signed)
OUTPATIENT OCCUPATIONAL THERAPY  Treatment Note   Patient Name: Chloe Johnson MRN: [id number] DOB:03-12-1956, 67 y.o., female Today's Date: 08/04/2022  PCP: Charlane Ferretti, MD REFERRING PROVIDER: Izora Ribas, MD      OT End of Session - 08/04/22 1109     Visit Number 36    Number of Visits 40    Date for OT Re-Evaluation 08/29/22    Authorization Type Medicare A&B    OT Start Time 1106    OT Stop Time 1146    OT Time Calculation (min) 40 min    Activity Tolerance Patient tolerated treatment well    Behavior During Therapy WFL for tasks assessed/performed                                       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/16/2013   L3-4/notes 11/24/2013, arms, neck   Exertional asthma    GERD (gastroesophageal reflux disease)    Heart attack ([place]) 01/18/2022   Hypertension    Kidney stones    "have always passed them"   Neck pain    Osteomyelitis ([place]) 11/23/2013   osteomyelitis, discitis    Sleep concern    uses Prozac for sleep    Stroke ([place]) 01/21/2022   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; left with aphasia, dysarthria, and apraxia   Past Surgical History:  Procedure Laterality Date   BREAST CYST EXCISION Left 2010   "polypectomy"   IR CT HEAD LTD  01/21/2022   IR CT HEAD LTD  01/28/2022   IR PERCUTANEOUS ART THROMBECTOMY/INFUSION INTRACRANIAL INC DIAG ANGIO  01/21/2022   IR PERCUTANEOUS ART THROMBECTOMY/INFUSION INTRACRANIAL INC DIAG ANGIO  01/28/2022   IR US GUIDE VASC ACCESS RIGHT  01/22/2022   PICC LINE PLACE PERIPHERAL ([place] HX) Right    for use of Levaquin & Vancomycin, of note: she reports that she had a "flulike feeling"    RADIOLOGY WITH ANESTHESIA N/A 01/21/2022   Procedure: IR WITH ANESTHESIA;  Surgeon: Luanne Bras, MD;  Location: [facility];  Service: Radiology;  Laterality: N/A;   RADIOLOGY WITH ANESTHESIA N/A 01/28/2022   Procedure: IR WITH ANESTHESIA;   Surgeon: Radiologist, Medication, MD;  Location: [facility];  Service: Radiology;  Laterality: N/A;   [place]   Patient Active Problem List   Diagnosis Date Noted   Aphasia 03/31/2022   Snores 03/31/2022   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   UTI (urinary tract infection) 02/04/2022   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   Stroke determined by clinical assessment ([place]) 01/28/2022   Middle cerebral artery embolism, left 01/28/2022   H/O ischemic left MCA stroke    Acute respiratory failure (HCC)    CVA (cerebral vascular accident) ([place]) 01/21/2022   NSTEMI (non-ST elevated myocardial infarction) ([place]) 01/21/2022   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   HFrEF (heart failure with reduced ejection fraction) ([place]) 01/21/2022   Hyperlipidemia 01/21/2022   Lumbar stenosis 01/21/2022   Stroke (cerebrum) ([place]) 01/21/2022   S/P lumbar spinal fusion 11/15/2014   Lumbar discitis 11/29/2013   Discitis 11/24/2013   Diarrhea 11/17/2013   Osteomyelitis of lumbar spine ([place]) 11/16/2013   Hypertension    Asthma    Anxiety     ONSET DATE: 01/28/22  REFERRING  DIAG: I63.9 (ICD-10-CM) - Cerebral infarction, unspecified   THERAPY DIAG:  Hemiplegia and hemiparesis following cerebral infarction affecting right dominant side (HCC)  Other lack of coordination  Muscle weakness (generalized)  Other disturbances of skin sensation  Rationale for Evaluation and Treatment Rehabilitation  SUBJECTIVE:   SUBJECTIVE STATEMENT: Pt reports they walked to the park this weekend, which was tiresome. Pt accompanied by: self  PERTINENT HISTORY: PMH: HTN, cervical radiculopathy, hepatitis, osteomyelitis/discitis, lumbar stenosis with foot drop anxiety, GERD, insomnia  PRECAUTIONS: Fall  WEIGHT BEARING RESTRICTIONS  was awaiting spinal fusion surgery, limit bending, twisting, lifting >10#  PAIN:  Are you having pain? No  FALLS:  Has patient fallen in last 6 months? Yes. Number of falls 2  LIVING ENVIRONMENT: Lives with: lives with their spouse Lives in: House/apartment Stairs: Yes: Internal: full flights of steps; on right going up and External: 7 steps; on left going up Has following equipment at home: Single point cane, Walker - 2 wheeled, Walker - 4 wheeled, shower chair, and bed side commode  PLOF: Independent  PATIENT GOALS I want my arm back.    OBJECTIVE:   HAND DOMINANCE: Right  ADLs: Transfers/ambulation related to ADLs: Mod I with no use of  Eating: Husband assists with cutting, feeding self with L hand Grooming: utilizing L hand with opening and brushing teeth UB Dressing: unable to manage buttons, otherwise Mod I LB Dressing: unable to tie shoes, just slipping on toes Toileting: Mod I with use of L hand Bathing: Mod I, utilizing L hand to shave Tub Shower transfers: Mod I Equipment: Shower seat with back, Walk in shower, and bed side commode   IADLs: Light housekeeping: laundry and hanging up clothes, wipes counters/sink, needs assist to fold clothing Meal Prep: no, did not cook pre-stroke Medication management: husband opens pill bottles Handwriting: unable  MOBILITY STATUS: Needs Assist: Supervision - Mod I without use of AD  POSTURE COMMENTS:  No Significant postural limitations  ACTIVITY TOLERANCE: Activity tolerance: WNL for tasks assessed on eval  FUNCTIONAL OUTCOME MEASURES: FOTO: 49  UPPER EXTREMITY ROM     Active ROM Right eval Left eval  Shoulder flexion WNL WNL  Shoulder abduction    Shoulder adduction    Shoulder extension    Shoulder internal rotation    Shoulder external rotation    Elbow flexion    Elbow extension -5 WNL  Wrist flexion 38 WNL  Wrist extension 50 WNL  Wrist ulnar deviation    Wrist radial deviation    Wrist pronation    Wrist supination    (Blank rows = not tested)   UPPER EXTREMITY MMT:     MMT Right eval Left eval  Shoulder  flexion 4+/5   Shoulder abduction    Shoulder adduction    Shoulder extension    Shoulder internal rotation    Shoulder external rotation    Middle trapezius    Lower trapezius    Elbow flexion 4+/5   Elbow extension 4+/5   Wrist flexion    Wrist extension    Wrist ulnar deviation    Wrist radial deviation    Wrist pronation    Wrist supination    (Blank rows = not tested)  HAND FUNCTION: Loose gross grasp, unable to squeeze dynamometer 06/06/22: R: 15#  COORDINATION: Box and Blocks:  Right 10 blocks, Left 53 blocks Modified performance with RUE with therapist holding blocks in palm and pt able to complete gross grasp to pick up one at a time  from OT palm of hand, minimal hand opening/closing 04/02/22: Right: 16 blocks  SENSATION: Light touch: WFL Stereognosis: Impaired  Hot/Cold: Impaired  Proprioception: Impaired   COGNITION: Overall cognitive status: Within functional limits for tasks assessed  VISION: Subjective report: Pt reports that her R eye does not close as well and that she feels that her L eye is compensating for her R eye. Baseline vision: Wears glasses all the time  VISION ASSESSMENT: To be further assessed in functional context  --------------------------------------------------------------------------------------------------------------------------------------------------------------------- (objective measures above completed at initial evaluation unless otherwise dated)  TODAY'S TREATMENT:  08/04/22 Box and blocks: R: 32 blocks with pt reporting improved sensation in finger tips. ADL: pt reports that she is brushing hair with L hand, but is utilizing her R hand when blow drying hair. Pt still with decreased [place] as needed to "scrunch" hair.  Opening variety of food containers, pt able to utilize thumb with supination to open lids and ulnar deviation to open screw top lids. Laundry task: engaged in folding and Therapist, music with focus on use of BUE in  conjunction during folding of clothing and then functional reach and bimanual use when hanging clothing on hangers and then hanging on rack.  Pt with improved bimanual use, however continues to utilize dominant RUE at diminished to non-dominant level due to spasticity and decreased coordination. NMR: engaged in supination/pronation from neutral position to simulate flipping pancakes with spatula.  Pt demonstrating good gross grasp on spatula and demonstrating supination ~65% of movement before demonstrating decreased motor control when flipping bean bags with spatula.  OT challenged pt to complete with pronation, pt demonstrating significantly decreased control and ease with pronation while maintaining grasp and control with spatula. Therapeutic exercise: OT instructed pt in shoulder external/internal rotation both from neutral position with towel under elbow for improved technique as well as in abduction.  OT providing tactile cues at elbow in abduction to facilitate proper positioning while providing verbal cues to attend to compensatory movements with shoulder hike and elbow extension to compensate for decreased ROM.      07/28/22 Wrist movements: Circumduction with forearm propped on pillow to facilitate increased ROM, completed clockwise and counter-clockwise.  Engaged in ulnar/radial deviation with 1# dumbbell.   Functional reach with grasp: engaged in retrieving sticky notes from vertical surface to challenge increased shoulder flexion as well as lateral and precision grip.  Incorporated reaching outside [place] and across midline to challenge balance in conjunction with shoulder ROM and grasp.  OT providing min cues for technique with reaching to challenge more functional reach and not internal rotation.  Pt reports mild pain (3/10) in R shoulder with repetition with overhead reaching.  Seated functional reach: engaged in picking up and placing bean bags on matching spots to facilitate increased grasp and  functional reach.  Pt reports no pain in horizontal plane, compared to when completing standing task with reaching in vertical plane.  Incorporated supination into task with forearm supination to place bean bags, pt requiring mod demonstration and verbal cues for technique.  Pt demonstrating ~2/3 supination, therefore encouraged pt to attempt with LUE to better understand movement, then resumed completion with RUE with much improved technique.      07/23/22 Coordination: stacking and unstacking checkers with R hand.  Pt demonstrating loose pincer grasp with ability to pick up and unstack checkers.  OT modified task to picking up and stacking coins (plastic coins).  Pt with increased difficulty, but able to pick up quarter sized coins with RUE with  increased effort and attempts.  OT then challenged pt to attempt to rotate coins in finger tips 180*, pt frequently dropping therefore terminated task.  OT had pt place checker pieces and then coins into coin slot with RUE, incorporating partial rotation in finger tips.  Pt demonstrating improved ability to rotate 90* after massed practice.  OT encouraged pt to utilize coins, checkers, poker chips to challenge [facility] with precision pinch and rotation.    PATIENT EDUCATION: Education details: ongoing education with focus on coordination and progressive ROM Person educated: Patient Education method: Explanation, Demonstration, and Verbal cues Education comprehension: verbalized understanding and needs further education   HOME EXERCISE PROGRAM: Fine motor coordination handout  Access Code: [email or web address] URL: [email or web address] Date: 08/04/2022 Prepared by: [facility]  Exercises - Seated Digit Tendon Gliding  - 2 x daily - 1 sets - 10 reps - Thumb AROM MP Blocking  - 2 x daily - 2 sets - 10 reps - Thumb Radial Adduction with Thumb Flexion AROM on Table  - 2 x daily - 2 sets - 10 reps - Wrist Prayer  Stretch  - 2 x daily - 2 sets - 10 reps - 5-10 sec hold - Wrist Flexion and Extension with Resistance Bar  - 1 x daily - 3 x weekly - 2 sets - 10 reps - Wrist Extension with Resistance  - 1 x daily - 3 x weekly - 2 sets - 5 reps - 3 sec hold - Wrist Flexion with Resistance  - 1 x daily - 3 x weekly - 2 sets - 5 reps - 3 sec hold - Seated Wrist Radial Deviation with Dumbbell  - 1 x daily - 3 x weekly - 2 sets - 10 reps - Seated Wrist Extension with Dumbbell  - 1 x daily - 3 x weekly - 2 sets - 10 reps - Forearm Supination with Dumbbell  - 1 x daily - 3 x weekly - 2 sets - 10 reps - Seated Single Arm Shoulder External Rotation  - 1 x daily - 3 x weekly - 2 sets - 10 reps - Shoulder Internal and External Rotation in Abduction  - 1 x daily - 3 x weekly - 2 sets - 10 reps      GOALS: Goals reviewed with patient? Yes   SHORT TERM GOALS: Target date: 08/08/22  1.  Pt will demonstrate improved external rotation and sustained grasp to complete grooming of hair with increased ease an independence.  Baseline:  Goal status: Met - 08/04/22  2.   Pt will demonstrate improved UE functional use for ADLs as evidenced by increasing box/ blocks score by 4 blocks with RUE.  Baseline: R: 28 blocks and L: 53  Goal status: Met - 32 blocks on 08/04/22  3.   Pt will demonstrate improved functional use of RUE to allow for increased ease and independence with laundering (washing, folding, ironing) clothes.    Baseline:  Goal status: Met - 08/04/22   LONG TERM GOALS: Target date: 08/29/22  1.  Pt will demonstrate use of dominant RUE as gross assist to aid in managing clothing fasteners (tying shoe laces and buttons). Baseline:  Goal status: IN PROGRESS  2.  Pt will demonstrate improved coordination as needed to engage in self-feeding with dominant RUE with built up handle PRN as evidenced by decreasing PPT#2 (self feeding) by 3 secs Baseline: TBD Goal status: IN PROGRESS  3.  Pt will demonstrate increased  functional reach and grasp to obtain or place moderate weight item from/on moderate height shelf to simulate IADLs. Baseline:  Goal status: IN PROGRESS  4. Pt will increase RUE pinch strength by 3# to increase success with holding and manipulating writing and/or eating utensil.  Baseline:   Goal status: IN PROGRESS  5.  Pt will be able to complete 9 hole peg test with RUE in 2 min time limit to demonstrate increased fine motor control as needed for ADLs/IADLs.  Baseline:   Goal status: IN PROGRESS   ASSESSMENT:  CLINICAL IMPRESSION: Pt continues to demonstrate mild decrease in supination and pronation impacting ease with opening food containers and flipping pancakes/burgers.  Pt benefiting from repetition with supination/pronation activity and tactile cues during external/internal rotation.  PERFORMANCE DEFICITS in functional skills including ADLs, IADLs, coordination, dexterity, sensation, ROM, strength, FMC, GMC, balance, body mechanics, decreased knowledge of use of DME, and UE functional use and psychosocial skills including environmental adaptation, habits, and routines and behaviors.   IMPAIRMENTS are limiting patient from ADLs, IADLs, and work.   COMORBIDITIES may have co-morbidities  that affects occupational performance. Patient will benefit from skilled OT to address above impairments and improve overall function.  MODIFICATION OR ASSISTANCE TO COMPLETE EVALUATION: Min-Moderate modification of tasks or assist with assess necessary to complete an evaluation.  OT OCCUPATIONAL PROFILE AND HISTORY: Detailed assessment: Review of records and additional review of physical, cognitive, psychosocial history related to current functional performance.  CLINICAL DECISION MAKING: Moderate - several treatment options, min-mod task modification necessary  REHAB POTENTIAL: Good  EVALUATION COMPLEXITY: Moderate    PLAN: OT FREQUENCY: 1-2x/week  OT DURATION: 6 weeks  PLANNED  INTERVENTIONS: self care/ADL training, therapeutic exercise, therapeutic activity, neuromuscular re-education, manual therapy, passive range of motion, balance training, functional mobility training, splinting, electrical stimulation, ultrasound, compression bandaging, moist heat, cryotherapy, patient/family education, psychosocial skills training, energy conservation, coping strategies training, and DME and/or AE instructions  RECOMMENDED OTHER SERVICES: N/A  CONSULTED AND AGREED WITH PLAN OF CARE: Patient and family member/caregiver  PLAN FOR NEXT SESSION: Coordination, wrist movements, external/internal rotation, functional reach, e-stim in conjunction with coordination/reach.   Huriel Matt, [place], OTR/L 08/04/2022, 11:09 AM

## 2022-08-05 ENCOUNTER — Other Ambulatory Visit: Payer: Self-pay | Admitting: Physical Medicine and Rehabilitation

## 2022-08-07 ENCOUNTER — Ambulatory Visit: Payer: Medicare Other

## 2022-08-07 DIAGNOSIS — R4701 Aphasia: Secondary | ICD-10-CM

## 2022-08-07 DIAGNOSIS — R482 Apraxia: Secondary | ICD-10-CM | POA: Diagnosis not present

## 2022-08-07 DIAGNOSIS — R471 Dysarthria and anarthria: Secondary | ICD-10-CM | POA: Diagnosis not present

## 2022-08-07 DIAGNOSIS — M6281 Muscle weakness (generalized): Secondary | ICD-10-CM | POA: Diagnosis not present

## 2022-08-07 DIAGNOSIS — R278 Other lack of coordination: Secondary | ICD-10-CM | POA: Diagnosis not present

## 2022-08-07 DIAGNOSIS — R41841 Cognitive communication deficit: Secondary | ICD-10-CM

## 2022-08-07 DIAGNOSIS — R208 Other disturbances of skin sensation: Secondary | ICD-10-CM | POA: Diagnosis not present

## 2022-08-07 DIAGNOSIS — I69351 Hemiplegia and hemiparesis following cerebral infarction affecting right dominant side: Secondary | ICD-10-CM | POA: Diagnosis not present
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
## 2022-08-11 ENCOUNTER — Ambulatory Visit: Payer: Medicare Other | Admitting: Occupational Therapy

## 2022-08-11 DIAGNOSIS — R208 Other disturbances of skin sensation: Secondary | ICD-10-CM | POA: Diagnosis not present

## 2022-08-11 DIAGNOSIS — R482 Apraxia: Secondary | ICD-10-CM | POA: Diagnosis not present

## 2022-08-11 DIAGNOSIS — H538 Other visual disturbances: Secondary | ICD-10-CM | POA: Diagnosis not present

## 2022-08-11 DIAGNOSIS — M6281 Muscle weakness (generalized): Secondary | ICD-10-CM

## 2022-08-11 DIAGNOSIS — I69351 Hemiplegia and hemiparesis following cerebral infarction affecting right dominant side: Secondary | ICD-10-CM | POA: Diagnosis not present

## 2022-08-11 DIAGNOSIS — R471 Dysarthria and anarthria: Secondary | ICD-10-CM | POA: Diagnosis not present

## 2022-08-11 DIAGNOSIS — R278 Other lack of coordination: Secondary | ICD-10-CM

## 2022-08-11 DIAGNOSIS — R4701 Aphasia: Secondary | ICD-10-CM | POA: Diagnosis not present

## 2022-08-11 DIAGNOSIS — R41841 Cognitive communication deficit: Secondary | ICD-10-CM | POA: Diagnosis not present

## 2022-08-11 NOTE — Therapy (Signed)
OUTPATIENT OCCUPATIONAL THERAPY  Treatment Note   Patient Name: Chloe Johnson MRN: [id number] DOB:[birth year], 67 y.o., female Today's Date: 08/11/2022  PCP: Charlane Ferretti, MD REFERRING PROVIDER: Izora Ribas, MD      OT End of Session - 08/11/22 1301     Visit Number 37    Number of Visits 26    Date for OT Re-Evaluation 08/29/22    Authorization Type Medicare A&B    OT Start Time 1105    OT Stop Time 1148    OT Time Calculation (min) 43 min    Activity Tolerance Patient tolerated treatment well    Behavior During Therapy WFL for tasks assessed/performed                                        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/16/2013   L3-4/notes 11/24/2013, arms, neck   Exertional asthma    GERD (gastroesophageal reflux disease)    Heart attack ([place]) 01/18/2022   Hypertension    Kidney stones    "have always passed them"   Neck pain    Osteomyelitis ([place]) 11/23/2013   osteomyelitis, discitis    Sleep concern    uses Prozac for sleep    Stroke ([place]) 01/21/2022   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; left with aphasia, dysarthria, and apraxia   Past Surgical History:  Procedure Laterality Date   BREAST CYST EXCISION Left 2010   "polypectomy"   IR CT HEAD LTD  01/21/2022   IR CT HEAD LTD  01/28/2022   IR PERCUTANEOUS ART THROMBECTOMY/INFUSION INTRACRANIAL INC DIAG ANGIO  01/21/2022   IR PERCUTANEOUS ART THROMBECTOMY/INFUSION INTRACRANIAL INC DIAG ANGIO  01/28/2022   IR US GUIDE VASC ACCESS RIGHT  01/22/2022   PICC LINE PLACE PERIPHERAL ([place] [facility]) Right    for use of Levaquin & Vancomycin, of note: she reports that she had a "flulike feeling"    RADIOLOGY WITH ANESTHESIA N/A 01/21/2022   Procedure: IR WITH ANESTHESIA;  Surgeon: Luanne Bras, MD;  Location: [facility];  Service: Radiology;  Laterality: N/A;   RADIOLOGY WITH ANESTHESIA N/A 01/28/2022   Procedure: IR WITH ANESTHESIA;   Surgeon: Radiologist, Medication, MD;  Location: [facility];  Service: Radiology;  Laterality: N/A;   [place]   Patient Active Problem List   Diagnosis Date Noted   Aphasia 03/31/2022   Snores 03/31/2022   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   UTI (urinary tract infection) 02/04/2022   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   Stroke determined by clinical assessment ([place]) 01/28/2022   Middle cerebral artery embolism, left 01/28/2022   H/O ischemic left MCA stroke    Acute respiratory failure (HCC)    CVA (cerebral vascular accident) ([place]) 01/21/2022   NSTEMI (non-ST elevated myocardial infarction) ([place]) 01/21/2022   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   HFrEF (heart failure with reduced ejection fraction) ([place]) 01/21/2022   Hyperlipidemia 01/21/2022   Lumbar stenosis 01/21/2022   Stroke (cerebrum) ([place]) 01/21/2022   S/P lumbar spinal fusion 11/15/2014   Lumbar discitis 11/29/2013   Discitis 11/24/2013   Diarrhea 11/17/2013   Osteomyelitis of lumbar spine ([place]) 11/16/2013   Hypertension    Asthma    Anxiety     ONSET DATE: 01/28/22  REFERRING DIAG: I63.9 (ICD-10-CM) - Cerebral infarction, unspecified   THERAPY DIAG:  Hemiplegia and hemiparesis following cerebral infarction affecting right dominant side (HCC)  Other lack of coordination  Muscle weakness (generalized)  Other disturbances of skin sensation  Rationale for Evaluation and Treatment Rehabilitation  SUBJECTIVE:   SUBJECTIVE STATEMENT: Pt reports that it was an "action packed weekend" Pt accompanied by: self  PERTINENT HISTORY: PMH: HTN, cervical radiculopathy, hepatitis, osteomyelitis/discitis, lumbar stenosis with foot drop anxiety, GERD, insomnia  PRECAUTIONS: Fall  WEIGHT BEARING RESTRICTIONS  was awaiting spinal fusion surgery, limit bending, twisting, lifting >10#  PAIN:  Are you having pain? No  FALLS: Has patient fallen  in last 6 months? Yes. Number of falls 2  LIVING ENVIRONMENT: Lives with: lives with their spouse Lives in: House/apartment Stairs: Yes: Internal: full flights of steps; on right going up and External: 7 steps; on left going up Has following equipment at home: Single point cane, Walker - 2 wheeled, Walker - 4 wheeled, shower chair, and bed side commode  PLOF: Independent  PATIENT GOALS I want my arm back.    OBJECTIVE:   HAND DOMINANCE: Right  ADLs: Transfers/ambulation related to ADLs: Mod I with no use of  Eating: Husband assists with cutting, feeding self with L hand Grooming: utilizing L hand with opening and brushing teeth UB Dressing: unable to manage buttons, otherwise Mod I LB Dressing: unable to tie shoes, just slipping on toes Toileting: Mod I with use of L hand Bathing: Mod I, utilizing L hand to shave Tub Shower transfers: Mod I Equipment: Shower seat with back, Walk in shower, and bed side commode   IADLs: Light housekeeping: laundry and hanging up clothes, wipes counters/sink, needs assist to fold clothing Meal Prep: no, did not cook pre-stroke Medication management: husband opens pill bottles Handwriting: unable  MOBILITY STATUS: Needs Assist: Supervision - Mod I without use of AD  POSTURE COMMENTS:  No Significant postural limitations  ACTIVITY TOLERANCE: Activity tolerance: WNL for tasks assessed on eval  FUNCTIONAL OUTCOME MEASURES: FOTO: 49  UPPER EXTREMITY ROM     Active ROM Right eval Left eval  Shoulder flexion WNL WNL  Shoulder abduction    Shoulder adduction    Shoulder extension    Shoulder internal rotation    Shoulder external rotation    Elbow flexion    Elbow extension -5 WNL  Wrist flexion 38 WNL  Wrist extension 50 WNL  Wrist ulnar deviation    Wrist radial deviation    Wrist pronation    Wrist supination    (Blank rows = not tested)   UPPER EXTREMITY MMT:     MMT Right eval Left eval  Shoulder flexion 4+/5    Shoulder abduction    Shoulder adduction    Shoulder extension    Shoulder internal rotation    Shoulder external rotation    Middle trapezius    Lower trapezius    Elbow flexion 4+/5   Elbow extension 4+/5   Wrist flexion    Wrist extension    Wrist ulnar deviation    Wrist radial deviation    Wrist pronation    Wrist supination    (Blank rows = not tested)  HAND FUNCTION: Loose gross grasp, unable to squeeze dynamometer 06/06/22: R: 15#  COORDINATION: Box and Blocks:  Right 10 blocks, Left 53 blocks Modified performance with RUE with therapist holding blocks in palm and pt able to complete gross grasp to pick up one at a time from OT  palm of hand, minimal hand opening/closing 04/02/22: Right: 16 blocks  SENSATION: Light touch: WFL Stereognosis: Impaired  Hot/Cold: Impaired  Proprioception: Impaired   COGNITION: Overall cognitive status: Within functional limits for tasks assessed  VISION: Subjective report: Pt reports that her R eye does not close as well and that she feels that her L eye is compensating for her R eye. Baseline vision: Wears glasses all the time  VISION ASSESSMENT: To be further assessed in functional context  --------------------------------------------------------------------------------------------------------------------------------------------------------------------- (objective measures above completed at initial evaluation unless otherwise dated)  TODAY'S TREATMENT:  08/11/22 NMES: attempted finger extension with NMES to further facilitate extension, pt reporting feeling slight sensation of stimulation and observing increased finger extension, however with difficulty syncing with functional movement and reports no sensation to time activation up with functional movement.  Applied NMES to forearm extenders.  Pt with no c/o pain at beginning or end of session.  No adverse reactions after treatment and skin is intact. Ratio 1:3 Rate 35  pps Waveform- Asymmetric Ramp 1.0 Pulse 300 Intensity- 130 Duration -   5 min Therapeutic activity: engaged in placing and removing large grip pegs in resistance peg board.  Pt demonstrating improved grasp and manipulation of pegs when placing and removing.  Pt incorporating wrist movements with radial deviation, extension, and finger movements with tripod pinch. Self-feeding: engaged in simulated self-feeding with fork with "stabbing" small pieces of putty to simulate food.  Pt demonstrating increased grasp and control when utilizing built up handle over fork handle, however reports that it does not fit on her fork and she will not use it in public.     08/15/53 Box and blocks: R: 32 blocks with pt reporting improved sensation in finger tips. ADL: pt reports that she is brushing hair with L hand, but is utilizing her R hand when blow drying hair. Pt still with decreased [place] as needed to "scrunch" hair.  Opening variety of food containers, pt able to utilize thumb with supination to open lids and ulnar deviation to open screw top lids. Laundry task: engaged in folding and Therapist, music with focus on use of BUE in conjunction during folding of clothing and then functional reach and bimanual use when hanging clothing on hangers and then hanging on rack.  Pt with improved bimanual use, however continues to utilize dominant RUE at diminished to non-dominant level due to spasticity and decreased coordination. NMR: engaged in supination/pronation from neutral position to simulate flipping pancakes with spatula.  Pt demonstrating good gross grasp on spatula and demonstrating supination ~65% of movement before demonstrating decreased motor control when flipping bean bags with spatula.  OT challenged pt to complete with pronation, pt demonstrating significantly decreased control and ease with pronation while maintaining grasp and control with spatula. Therapeutic exercise: OT instructed pt in shoulder  external/internal rotation both from neutral position with towel under elbow for improved technique as well as in abduction.  OT providing tactile cues at elbow in abduction to facilitate proper positioning while providing verbal cues to attend to compensatory movements with shoulder hike and elbow extension to compensate for decreased ROM.      07/28/22 Wrist movements: Circumduction with forearm propped on pillow to facilitate increased ROM, completed clockwise and counter-clockwise.  Engaged in ulnar/radial deviation with 1# dumbbell.   Functional reach with grasp: engaged in retrieving sticky notes from vertical surface to challenge increased shoulder flexion as well as lateral and precision grip.  Incorporated reaching outside [place] and across midline to challenge balance in  conjunction with shoulder ROM and grasp.  OT providing min cues for technique with reaching to challenge more functional reach and not internal rotation.  Pt reports mild pain (3/10) in R shoulder with repetition with overhead reaching.  Seated functional reach: engaged in picking up and placing bean bags on matching spots to facilitate increased grasp and functional reach.  Pt reports no pain in horizontal plane, compared to when completing standing task with reaching in vertical plane.  Incorporated supination into task with forearm supination to place bean bags, pt requiring mod demonstration and verbal cues for technique.  Pt demonstrating ~2/3 supination, therefore encouraged pt to attempt with LUE to better understand movement, then resumed completion with RUE with much improved technique.      07/23/22 Coordination: stacking and unstacking checkers with R hand.  Pt demonstrating loose pincer grasp with ability to pick up and unstack checkers.  OT modified task to picking up and stacking coins (plastic coins).  Pt with increased difficulty, but able to pick up quarter sized coins with RUE with increased effort and attempts.  OT  then challenged pt to attempt to rotate coins in finger tips 180*, pt frequently dropping therefore terminated task.  OT had pt place checker pieces and then coins into coin slot with RUE, incorporating partial rotation in finger tips.  Pt demonstrating improved ability to rotate 90* after massed practice.  OT encouraged pt to utilize coins, checkers, poker chips to challenge [facility] with precision pinch and rotation.    PATIENT EDUCATION: Education details: ongoing education with focus on coordination and progressive ROM Person educated: Patient Education method: Explanation, Demonstration, and Verbal cues Education comprehension: verbalized understanding and needs further education   HOME EXERCISE PROGRAM: Fine motor coordination handout  Access Code: [email or web address] URL: [email or web address] Date: 08/04/2022 Prepared by: [facility]  Exercises - Seated Digit Tendon Gliding  - 2 x daily - 1 sets - 10 reps - Thumb AROM MP Blocking  - 2 x daily - 2 sets - 10 reps - Thumb Radial Adduction with Thumb Flexion AROM on Table  - 2 x daily - 2 sets - 10 reps - Wrist Prayer Stretch  - 2 x daily - 2 sets - 10 reps - 5-10 sec hold - Wrist Flexion and Extension with Resistance Bar  - 1 x daily - 3 x weekly - 2 sets - 10 reps - Wrist Extension with Resistance  - 1 x daily - 3 x weekly - 2 sets - 5 reps - 3 sec hold - Wrist Flexion with Resistance  - 1 x daily - 3 x weekly - 2 sets - 5 reps - 3 sec hold - Seated Wrist Radial Deviation with Dumbbell  - 1 x daily - 3 x weekly - 2 sets - 10 reps - Seated Wrist Extension with Dumbbell  - 1 x daily - 3 x weekly - 2 sets - 10 reps - Forearm Supination with Dumbbell  - 1 x daily - 3 x weekly - 2 sets - 10 reps - Seated Single Arm Shoulder External Rotation  - 1 x daily - 3 x weekly - 2 sets - 10 reps - Shoulder Internal and External Rotation in Abduction  - 1 x daily - 3 x weekly - 2 sets - 10  reps      GOALS: Goals reviewed with patient? Yes   SHORT TERM GOALS: Target date: 08/08/22  1.  Pt will demonstrate improved external rotation  and sustained grasp to complete grooming of hair with increased ease an independence.  Baseline:  Goal status: Met - 08/04/22  2.   Pt will demonstrate improved UE functional use for ADLs as evidenced by increasing box/ blocks score by 4 blocks with RUE.  Baseline: R: 28 blocks and L: 53  Goal status: Met - 32 blocks on 08/04/22  3.   Pt will demonstrate improved functional use of RUE to allow for increased ease and independence with laundering (washing, folding, ironing) clothes.    Baseline:  Goal status: Met - 08/04/22   LONG TERM GOALS: Target date: 08/29/22  1.  Pt will demonstrate use of dominant RUE as gross assist to aid in managing clothing fasteners (tying shoe laces and buttons). Baseline:  Goal status: IN PROGRESS  2.  Pt will demonstrate improved coordination as needed to engage in self-feeding with dominant RUE with built up handle PRN as evidenced by decreasing PPT#2 (self feeding) by 3 secs Baseline: TBD Goal status: IN PROGRESS  3.  Pt will demonstrate increased functional reach and grasp to obtain or place moderate weight item from/on moderate height shelf to simulate IADLs. Baseline:  Goal status: IN PROGRESS  4. Pt will increase RUE pinch strength by 3# to increase success with holding and manipulating writing and/or eating utensil.  Baseline:   Goal status: IN PROGRESS  5.  Pt will be able to complete 9 hole peg test with RUE in 2 min time limit to demonstrate increased fine motor control as needed for ADLs/IADLs.  Baseline:   Goal status: IN PROGRESS   ASSESSMENT:  CLINICAL IMPRESSION: Pt continues to demonstrate decreased tripod grasp due to impaired sensation, pinch strength, and coordination.  Pt demonstrates improvements with simulated self-feeding with use of built up handle, however expresses desire to  improve to no longer utilizing built up handle.  Pt asking questions about additional resources and equipment to maximize her motor recovery.  Pt asking about robotic hand device (Saebo glove and/or myomo glove) to continue to maximize her recovery.  Pt expressing desire to get full regain of function in her hand. Discussed current timeline and pt continuing to progress each week.  Also engaged in discussion about Vivistim and qualification as well as timeline as pt still making gains.  PERFORMANCE DEFICITS in functional skills including ADLs, IADLs, coordination, dexterity, sensation, ROM, strength, FMC, GMC, balance, body mechanics, decreased knowledge of use of DME, and UE functional use and psychosocial skills including environmental adaptation, habits, and routines and behaviors.   IMPAIRMENTS are limiting patient from ADLs, IADLs, and work.   COMORBIDITIES may have co-morbidities  that affects occupational performance. Patient will benefit from skilled OT to address above impairments and improve overall function.  MODIFICATION OR ASSISTANCE TO COMPLETE EVALUATION: Min-Moderate modification of tasks or assist with assess necessary to complete an evaluation.  OT OCCUPATIONAL PROFILE AND HISTORY: Detailed assessment: Review of records and additional review of physical, cognitive, psychosocial history related to current functional performance.  CLINICAL DECISION MAKING: Moderate - several treatment options, min-mod task modification necessary  REHAB POTENTIAL: Good  EVALUATION COMPLEXITY: Moderate    PLAN: OT FREQUENCY: 1-2x/week  OT DURATION: 6 weeks  PLANNED INTERVENTIONS: self care/ADL training, therapeutic exercise, therapeutic activity, neuromuscular re-education, manual therapy, passive range of motion, balance training, functional mobility training, splinting, electrical stimulation, ultrasound, compression bandaging, moist heat, cryotherapy, patient/family education, psychosocial  skills training, energy conservation, coping strategies training, and DME and/or AE instructions  RECOMMENDED OTHER SERVICES: N/A  CONSULTED  AND AGREED WITH PLAN OF CARE: Patient and family member/caregiver  PLAN FOR NEXT SESSION: Coordination, wrist movements, external/internal rotation, functional reach, various pinches, pinch strengthening, e-stim in conjunction with coordination/reach.   Simonne Come, OTR/L 08/11/2022, 1:01 PM

## 2022-08-12 ENCOUNTER — Ambulatory Visit (INDEPENDENT_AMBULATORY_CARE_PROVIDER_SITE_OTHER): Payer: Medicare Other | Admitting: Neurology

## 2022-08-12 DIAGNOSIS — R4701 Aphasia: Secondary | ICD-10-CM

## 2022-08-12 DIAGNOSIS — I63132 Cerebral infarction due to embolism of left carotid artery: Secondary | ICD-10-CM

## 2022-08-13 ENCOUNTER — Ambulatory Visit: Payer: Medicare Other

## 2022-08-13 DIAGNOSIS — R471 Dysarthria and anarthria: Secondary | ICD-10-CM | POA: Diagnosis not present

## 2022-08-13 DIAGNOSIS — R41841 Cognitive communication deficit: Secondary | ICD-10-CM

## 2022-08-13 DIAGNOSIS — R4701 Aphasia: Secondary | ICD-10-CM

## 2022-08-13 DIAGNOSIS — R278 Other lack of coordination: Secondary | ICD-10-CM | POA: Diagnosis not present

## 2022-08-13 DIAGNOSIS — M6281 Muscle weakness (generalized): Secondary | ICD-10-CM | POA: Diagnosis not present

## 2022-08-13 DIAGNOSIS — I69351 Hemiplegia and hemiparesis following cerebral infarction affecting right dominant side: Secondary | ICD-10-CM | POA: Diagnosis not present

## 2022-08-13 DIAGNOSIS — R482 Apraxia: Secondary | ICD-10-CM

## 2022-08-13 DIAGNOSIS — R208 Other disturbances of skin sensation: Secondary | ICD-10-CM | POA: Diagnosis not present

## 2022-08-13 NOTE — Procedures (Signed)
   HISTORY: 67 year old female with history of left MCA stroke,  TECHNIQUE:  This is a routine 16 channel EEG recording with one channel devoted to a limited EKG recording.  It was performed during wakefulness, drowsiness and asleep.  Hyperventilation and photic stimulation were performed as activating procedures.  There are minimum muscle and movement artifact noted.  Upon maximum arousal, posterior dominant waking rhythm consistent of rhythmic alpha range activity. Activities are symmetric over the bilateral posterior derivations and attenuated with eye opening.  There are intermittent bitemporal parietal area slowing, mild dysrhythmic higher amplitude theta range activity,  Hyperventilation produced mild/moderate buildup with higher amplitude and the slower activities noted.  Photic stimulation did not alter the tracing.  During EEG recording, patient developed drowsiness and no deeper stage of sleep was achieved  During EEG recording, there was no epileptiform discharge noted.  EKG demonstrate normal sinus rhythm.  CONCLUSION: This is essentially a normal awake EEG.  There is no electrodiagnostic evidence of epileptiform discharge.  Marcial Pacas, M.D. Ph.D.  [facility] [place], [place] [address] Phone: [phone] Fax:      [phone]
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
## 2022-08-19 ENCOUNTER — Ambulatory Visit: Payer: Medicare Other

## 2022-08-19 ENCOUNTER — Ambulatory Visit: Payer: Medicare Other | Admitting: Occupational Therapy

## 2022-08-19 DIAGNOSIS — R4701 Aphasia: Secondary | ICD-10-CM

## 2022-08-19 DIAGNOSIS — R471 Dysarthria and anarthria: Secondary | ICD-10-CM | POA: Diagnosis not present

## 2022-08-19 DIAGNOSIS — R482 Apraxia: Secondary | ICD-10-CM

## 2022-08-19 DIAGNOSIS — R208 Other disturbances of skin sensation: Secondary | ICD-10-CM

## 2022-08-19 DIAGNOSIS — I69351 Hemiplegia and hemiparesis following cerebral infarction affecting right dominant side: Secondary | ICD-10-CM

## 2022-08-19 DIAGNOSIS — R278 Other lack of coordination: Secondary | ICD-10-CM

## 2022-08-19 DIAGNOSIS — R41841 Cognitive communication deficit: Secondary | ICD-10-CM | POA: Diagnosis not present

## 2022-08-19 DIAGNOSIS — M6281 Muscle weakness (generalized): Secondary | ICD-10-CM | POA: Diagnosis not present
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
## 2022-08-19 NOTE — Therapy (Signed)
OUTPATIENT OCCUPATIONAL THERAPY  Treatment Note   Patient Name: CARMI BALLEN MRN: [id number] DOB:14-May-1956, 67 y.o., female Today's Date: 08/19/2022  PCP: Charlane Ferretti, MD REFERRING PROVIDER: Izora Ribas, MD      OT End of Session - 08/19/22 1111     Visit Number 23    Number of Visits 33    Date for OT Re-Evaluation 08/29/22    Authorization Type Medicare A&B    OT Start Time 1105    OT Stop Time 1145    OT Time Calculation (min) 40 min    Activity Tolerance Patient tolerated treatment well    Behavior During Therapy WFL for tasks assessed/performed                                        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/16/2013   L3-4/notes 11/24/2013, arms, neck   Exertional asthma    GERD (gastroesophageal reflux disease)    Heart attack ([place]) 01/18/2022   Hypertension    Kidney stones    "have always passed them"   Neck pain    Osteomyelitis ([place]) 11/23/2013   osteomyelitis, discitis    Sleep concern    uses Prozac for sleep    Stroke ([place]) 01/21/2022   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; left with aphasia, dysarthria, and apraxia   Past Surgical History:  Procedure Laterality Date   BREAST CYST EXCISION Left 2010   "polypectomy"   IR CT HEAD LTD  01/21/2022   IR CT HEAD LTD  01/28/2022   IR PERCUTANEOUS ART THROMBECTOMY/INFUSION INTRACRANIAL INC DIAG ANGIO  01/21/2022   IR PERCUTANEOUS ART THROMBECTOMY/INFUSION INTRACRANIAL INC DIAG ANGIO  01/28/2022   IR US GUIDE VASC ACCESS RIGHT  01/22/2022   PICC LINE PLACE PERIPHERAL ([place] [facility]) Right    for use of Levaquin & Vancomycin, of note: she reports that she had a "flulike feeling"    RADIOLOGY WITH ANESTHESIA N/A 01/21/2022   Procedure: IR WITH ANESTHESIA;  Surgeon: Luanne Bras, MD;  Location: [facility];  Service: Radiology;  Laterality: N/A;   RADIOLOGY WITH ANESTHESIA N/A 01/28/2022   Procedure: IR WITH ANESTHESIA;   Surgeon: Radiologist, Medication, MD;  Location: [facility];  Service: Radiology;  Laterality: N/A;   [place]   Patient Active Problem List   Diagnosis Date Noted   Aphasia 03/31/2022   Snores 03/31/2022   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   UTI (urinary tract infection) 02/04/2022   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   Stroke determined by clinical assessment ([place]) 01/28/2022   Middle cerebral artery embolism, left 01/28/2022   H/O ischemic left MCA stroke    Acute respiratory failure (HCC)    CVA (cerebral vascular accident) ([place]) 01/21/2022   NSTEMI (non-ST elevated myocardial infarction) ([place]) 01/21/2022   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   HFrEF (heart failure with reduced ejection fraction) ([place]) 01/21/2022   Hyperlipidemia 01/21/2022   Lumbar stenosis 01/21/2022   Stroke (cerebrum) ([place]) 01/21/2022   S/P lumbar spinal fusion 11/15/2014   Lumbar discitis 11/29/2013   Discitis 11/24/2013   Diarrhea 11/17/2013   Osteomyelitis of lumbar spine ([place]) 11/16/2013   Hypertension    Asthma    Anxiety     ONSET DATE: 01/28/22  REFERRING DIAG: I63.9 (ICD-10-CM) - Cerebral infarction, unspecified   THERAPY DIAG:  Hemiplegia and hemiparesis following cerebral infarction affecting right dominant side (HCC)  Other lack of coordination  Muscle weakness (generalized)  Other disturbances of skin sensation  Rationale for Evaluation and Treatment Rehabilitation  SUBJECTIVE:   SUBJECTIVE STATEMENT: Pt reports that she fell about 4 days ago going down 2 steps when she was carrying items down the stairs.  She said it does not hurt when she is walking, but does hurt when she touches it. Pt accompanied by: self  PERTINENT HISTORY: PMH: HTN, cervical radiculopathy, hepatitis, osteomyelitis/discitis, lumbar stenosis with foot drop anxiety, GERD, insomnia  PRECAUTIONS: Fall  WEIGHT BEARING RESTRICTIONS   was awaiting spinal fusion surgery, limit bending, twisting, lifting >10#  PAIN:  Are you having pain? No  FALLS: Has patient fallen in last 6 months? Yes. Number of falls 2  LIVING ENVIRONMENT: Lives with: lives with their spouse Lives in: House/apartment Stairs: Yes: Internal: full flights of steps; on right going up and External: 7 steps; on left going up Has following equipment at home: Single point cane, Walker - 2 wheeled, Walker - 4 wheeled, shower chair, and bed side commode  PLOF: Independent  PATIENT GOALS I want my arm back.    OBJECTIVE:   HAND DOMINANCE: Right  ADLs: Transfers/ambulation related to ADLs: Mod I with no use of  Eating: Husband assists with cutting, feeding self with L hand Grooming: utilizing L hand with opening and brushing teeth UB Dressing: unable to manage buttons, otherwise Mod I LB Dressing: unable to tie shoes, just slipping on toes Toileting: Mod I with use of L hand Bathing: Mod I, utilizing L hand to shave Tub Shower transfers: Mod I Equipment: Shower seat with back, Walk in shower, and bed side commode   IADLs: Light housekeeping: laundry and hanging up clothes, wipes counters/sink, needs assist to fold clothing Meal Prep: no, did not cook pre-stroke Medication management: husband opens pill bottles Handwriting: unable  MOBILITY STATUS: Needs Assist: Supervision - Mod I without use of AD  POSTURE COMMENTS:  No Significant postural limitations  ACTIVITY TOLERANCE: Activity tolerance: WNL for tasks assessed on eval  FUNCTIONAL OUTCOME MEASURES: FOTO: 49  UPPER EXTREMITY ROM     Active ROM Right eval Left eval  Shoulder flexion WNL WNL  Shoulder abduction    Shoulder adduction    Shoulder extension    Shoulder internal rotation    Shoulder external rotation    Elbow flexion    Elbow extension -5 WNL  Wrist flexion 38 WNL  Wrist extension 50 WNL  Wrist ulnar deviation    Wrist radial deviation    Wrist pronation     Wrist supination    (Blank rows = not tested)   UPPER EXTREMITY MMT:     MMT Right eval Left eval  Shoulder flexion 4+/5   Shoulder abduction    Shoulder adduction    Shoulder extension    Shoulder internal rotation    Shoulder external rotation    Middle trapezius    Lower trapezius    Elbow flexion 4+/5   Elbow extension 4+/5   Wrist flexion    Wrist extension    Wrist ulnar deviation    Wrist radial deviation    Wrist pronation    Wrist supination    (Blank rows = not tested)  HAND FUNCTION: Loose gross grasp, unable to squeeze dynamometer 06/06/22: R: 15#  COORDINATION: Box and Blocks:  Right 10  blocks, Left 53 blocks Modified performance with RUE with therapist holding blocks in palm and pt able to complete gross grasp to pick up one at a time from OT palm of hand, minimal hand opening/closing 04/02/22: Right: 16 blocks  SENSATION: Light touch: WFL Stereognosis: Impaired  Hot/Cold: Impaired  Proprioception: Impaired   COGNITION: Overall cognitive status: Within functional limits for tasks assessed  VISION: Subjective report: Pt reports that her R eye does not close as well and that she feels that her L eye is compensating for her R eye. Baseline vision: Wears glasses all the time  VISION ASSESSMENT: To be further assessed in functional context  --------------------------------------------------------------------------------------------------------------------------------------------------------------------- (objective measures above completed at initial evaluation unless otherwise dated)  TODAY'S TREATMENT:  08/19/22 Therapeutic activity: engaged in Connect 4 in typical setup with focus on pinch, rotation, and wrist extension to pick up and place pieces in to connect 4 grid.  Pt demonstrating improved motor control, despite persistent spasticity in fingers limiting ROM and sensation.  Transitioned grid to vertical on side to simulate coin slot.  Pt able to  pick up and rotate pegs to place into slot with improving isolated finger movement to push into slot.  Engaged in [employer] with good motor control.  Attempted to increase challenge to incorporating rotation in finger tips when removing, however pt unable to rotate pieces. Manual therapy: massage to index finger in retrograde massage with circular movements while educating on massaging from distal to proximal method to facilitate movement of fluid.  Engaged in discussion of possibility of botox intervention for continued motor recovery. Grip strength: 1,2 # resistance clothespins with RUE for mid functional reaching and sustained pinch. Pt demonstrating good mobility to pick up clothespins and good pinch strength to place and remove.     08/11/22 NMES: attempted finger extension with NMES to further facilitate extension, pt reporting feeling slight sensation of stimulation and observing increased finger extension, however with difficulty syncing with functional movement and reports no sensation to time activation up with functional movement.  Applied NMES to forearm extenders.  Pt with no c/o pain at beginning or end of session.  No adverse reactions after treatment and skin is intact. Ratio 1:3 Rate 35 pps Waveform- Asymmetric Ramp 1.0 Pulse 300 Intensity- 130 Duration -   5 min Therapeutic activity: engaged in placing and removing large grip pegs in resistance peg board.  Pt demonstrating improved grasp and manipulation of pegs when placing and removing.  Pt incorporating wrist movements with radial deviation, extension, and finger movements with tripod pinch. Self-feeding: engaged in simulated self-feeding with fork with "stabbing" small pieces of putty to simulate food.  Pt demonstrating increased grasp and control when utilizing built up handle over fork handle, however reports that it does not fit on her fork and she will not use it in public.     [id number] Box and blocks: R:  32 blocks with pt reporting improved sensation in finger tips. ADL: pt reports that she is brushing hair with L hand, but is utilizing her R hand when blow drying hair. Pt still with decreased Shirley as needed to "scrunch" hair.  Opening variety of food containers, pt able to utilize thumb with supination to open lids and ulnar deviation to open screw top lids. Laundry task: engaged in folding and Therapist, music with focus on use of BUE in conjunction during folding of clothing and then functional reach and bimanual use when hanging clothing on hangers and then hanging on rack.  Pt with improved bimanual use, however continues to utilize dominant RUE at diminished to non-dominant level due to spasticity and decreased coordination. NMR: engaged in supination/pronation from neutral position to simulate flipping pancakes with spatula.  Pt demonstrating good gross grasp on spatula and demonstrating supination ~65% of movement before demonstrating decreased motor control when flipping bean bags with spatula.  OT challenged pt to complete with pronation, pt demonstrating significantly decreased control and ease with pronation while maintaining grasp and control with spatula. Therapeutic exercise: OT instructed pt in shoulder external/internal rotation both from neutral position with towel under elbow for improved technique as well as in abduction.  OT providing tactile cues at elbow in abduction to facilitate proper positioning while providing verbal cues to attend to compensatory movements with shoulder hike and elbow extension to compensate for decreased ROM.       PATIENT EDUCATION: Education details: ongoing education with focus on coordination and progressive ROM Person educated: Patient Education method: Explanation, Demonstration, and Verbal cues Education comprehension: verbalized understanding and needs further education   HOME EXERCISE PROGRAM: Fine motor coordination handout  Access Code:  [email or web address] URL: [email or web address] Date: 08/04/2022 Prepared by: [facility]  Exercises - Seated Digit Tendon Gliding  - 2 x daily - 1 sets - 10 reps - Thumb AROM MP Blocking  - 2 x daily - 2 sets - 10 reps - Thumb Radial Adduction with Thumb Flexion AROM on Table  - 2 x daily - 2 sets - 10 reps - Wrist Prayer Stretch  - 2 x daily - 2 sets - 10 reps - 5-10 sec hold - Wrist Flexion and Extension with Resistance Bar  - 1 x daily - 3 x weekly - 2 sets - 10 reps - Wrist Extension with Resistance  - 1 x daily - 3 x weekly - 2 sets - 5 reps - 3 sec hold - Wrist Flexion with Resistance  - 1 x daily - 3 x weekly - 2 sets - 5 reps - 3 sec hold - Seated Wrist Radial Deviation with Dumbbell  - 1 x daily - 3 x weekly - 2 sets - 10 reps - Seated Wrist Extension with Dumbbell  - 1 x daily - 3 x weekly - 2 sets - 10 reps - Forearm Supination with Dumbbell  - 1 x daily - 3 x weekly - 2 sets - 10 reps - Seated Single Arm Shoulder External Rotation  - 1 x daily - 3 x weekly - 2 sets - 10 reps - Shoulder Internal and External Rotation in Abduction  - 1 x daily - 3 x weekly - 2 sets - 10 reps      GOALS: Goals reviewed with patient? Yes   SHORT TERM GOALS: Target date: 08/08/22  1.  Pt will demonstrate improved external rotation and sustained grasp to complete grooming of hair with increased ease an independence.  Baseline:  Goal status: Met - 08/04/22  2.   Pt will demonstrate improved UE functional use for ADLs as evidenced by increasing box/ blocks score by 4 blocks with RUE.  Baseline: R: 28 blocks and L: 53  Goal status: Met - 32 blocks on 08/04/22  3.   Pt will demonstrate improved functional use of RUE to allow for increased ease and independence with laundering (washing, folding, ironing) clothes.    Baseline:  Goal status: Met - 08/04/22   LONG TERM GOALS: Target date: 08/29/22  1.  Pt will  demonstrate use of dominant RUE as gross assist  to aid in managing clothing fasteners (tying shoe laces and buttons). Baseline:  Goal status: IN PROGRESS  2.  Pt will demonstrate improved coordination as needed to engage in self-feeding with dominant RUE with built up handle PRN as evidenced by decreasing PPT#2 (self feeding) by 3 secs Baseline: TBD Goal status: IN PROGRESS  3.  Pt will demonstrate increased functional reach and grasp to obtain or place moderate weight item from/on moderate height shelf to simulate IADLs. Baseline:  Goal status: IN PROGRESS  4. Pt will increase RUE pinch strength by 3# to increase success with holding and manipulating writing and/or eating utensil.  Baseline:   Goal status: IN PROGRESS  5.  Pt will be able to complete 9 hole peg test with RUE in 2 min time limit to demonstrate increased fine motor control as needed for ADLs/IADLs.  Baseline:   Goal status: IN PROGRESS   ASSESSMENT:  CLINICAL IMPRESSION: Pt continues to demonstrate decreased tripod grasp due to impaired sensation, pinch strength, and coordination, impacting fine motor control and rotation of Connect 4 pieces in hand.  Pt asking about Botox for spasticity to continue to maximize her recovery.  Pt expressing desire to get full regain of function in her hand. Discussed current timeline and pt continuing to progress each week.    PERFORMANCE DEFICITS in functional skills including ADLs, IADLs, coordination, dexterity, sensation, ROM, strength, FMC, GMC, balance, body mechanics, decreased knowledge of use of DME, and UE functional use and psychosocial skills including environmental adaptation, habits, and routines and behaviors.   IMPAIRMENTS are limiting patient from ADLs, IADLs, and work.   COMORBIDITIES may have co-morbidities  that affects occupational performance. Patient will benefit from skilled OT to address above impairments and improve overall function.  MODIFICATION OR ASSISTANCE TO COMPLETE EVALUATION: Min-Moderate  modification of tasks or assist with assess necessary to complete an evaluation.  OT OCCUPATIONAL PROFILE AND HISTORY: Detailed assessment: Review of records and additional review of physical, cognitive, psychosocial history related to current functional performance.  CLINICAL DECISION MAKING: Moderate - several treatment options, min-mod task modification necessary  REHAB POTENTIAL: Good  EVALUATION COMPLEXITY: Moderate    PLAN: OT FREQUENCY: 1-2x/week  OT DURATION: 6 weeks  PLANNED INTERVENTIONS: self care/ADL training, therapeutic exercise, therapeutic activity, neuromuscular re-education, manual therapy, passive range of motion, balance training, functional mobility training, splinting, electrical stimulation, ultrasound, compression bandaging, moist heat, cryotherapy, patient/family education, psychosocial skills training, energy conservation, coping strategies training, and DME and/or AE instructions  RECOMMENDED OTHER SERVICES: N/A  CONSULTED AND AGREED WITH PLAN OF CARE: Patient and family member/caregiver  PLAN FOR NEXT SESSION: Coordination, wrist movements, external/internal rotation, functional reach, various pinches, pinch strengthening, e-stim in conjunction with coordination/reach.   Corby Vandenberghe, [place], OTR/L 08/19/2022, 11:13 AM

## 2022-08-20 DIAGNOSIS — N393 Stress incontinence (female) (male): Secondary | ICD-10-CM | POA: Diagnosis not present

## 2022-08-20 DIAGNOSIS — R3915 Urgency of urination: Secondary | ICD-10-CM | POA: Diagnosis not present

## 2022-08-20 DIAGNOSIS — R350 Frequency of micturition: Secondary | ICD-10-CM | POA: Diagnosis not present

## 2022-08-21 ENCOUNTER — Ambulatory Visit: Payer: Medicare Other | Attending: Cardiology

## 2022-08-21 DIAGNOSIS — I639 Cerebral infarction, unspecified: Secondary | ICD-10-CM | POA: Insufficient documentation

## 2022-08-21 DIAGNOSIS — I513 Intracardiac thrombosis, not elsewhere classified: Secondary | ICD-10-CM | POA: Insufficient documentation

## 2022-08-21 LAB — POCT INR: INR: 2.7 (ref 2.0–3.0)

## 2022-08-21 NOTE — Patient Instructions (Signed)
Description   Continue taking warfarin 1 tablet daily.  Continue green leafy veggie to weekly.  Recheck INR in 4 weeks.   [facility] [phone]

## 2022-08-26 ENCOUNTER — Ambulatory Visit: Payer: Federal, State, Local not specified - PPO

## 2022-08-26 ENCOUNTER — Ambulatory Visit
Payer: Federal, State, Local not specified - PPO | Attending: Physical Medicine and Rehabilitation | Admitting: Occupational Therapy

## 2022-08-26 DIAGNOSIS — R4701 Aphasia: Secondary | ICD-10-CM | POA: Diagnosis not present

## 2022-08-26 DIAGNOSIS — R471 Dysarthria and anarthria: Secondary | ICD-10-CM

## 2022-08-26 DIAGNOSIS — R482 Apraxia: Secondary | ICD-10-CM | POA: Insufficient documentation

## 2022-08-26 DIAGNOSIS — I69351 Hemiplegia and hemiparesis following cerebral infarction affecting right dominant side: Secondary | ICD-10-CM | POA: Insufficient documentation

## 2022-08-26 DIAGNOSIS — M6281 Muscle weakness (generalized): Secondary | ICD-10-CM | POA: Diagnosis not present

## 2022-08-26 DIAGNOSIS — R278 Other lack of coordination: Secondary | ICD-10-CM | POA: Insufficient documentation

## 2022-08-26 DIAGNOSIS — R41841 Cognitive communication deficit: Secondary | ICD-10-CM | POA: Insufficient documentation

## 2022-08-26 DIAGNOSIS — R208 Other disturbances of skin sensation: Secondary | ICD-10-CM | POA: Diagnosis not present
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
## 2022-08-26 NOTE — Therapy (Signed)
OUTPATIENT OCCUPATIONAL THERAPY  Treatment Note   Patient Name: Chloe Johnson MRN: [id number] DOB:10/19/1955, 67 y.o., female Today's Date: 08/26/2022  PCP: Thana Ates, MD REFERRING PROVIDER: Horton Chin, MD      OT End of Session - 08/26/22 1625     Visit Number 39    Number of Visits 45    Date for OT Re-Evaluation 08/29/22    Authorization Type Medicare A&B    OT Start Time 1402    OT Stop Time 1445    OT Time Calculation (min) 43 min    Activity Tolerance Patient tolerated treatment well    Behavior During Therapy WFL for tasks assessed/performed                                         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/16/2013   L3-4/notes 11/24/2013, arms, neck   Exertional asthma    GERD (gastroesophageal reflux disease)    Heart attack 01/18/2022   Hypertension    Kidney stones    "have always passed them"   Neck pain    Osteomyelitis 11/23/2013   osteomyelitis, discitis    Sleep concern    uses Prozac for sleep    Stroke 01/21/2022   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; left with aphasia, dysarthria, and apraxia   Past Surgical History:  Procedure Laterality Date   BREAST CYST EXCISION Left 2010   "polypectomy"   IR CT HEAD LTD  01/21/2022   IR CT HEAD LTD  01/28/2022   IR PERCUTANEOUS ART THROMBECTOMY/INFUSION INTRACRANIAL INC DIAG ANGIO  01/21/2022   IR PERCUTANEOUS ART THROMBECTOMY/INFUSION INTRACRANIAL INC DIAG ANGIO  01/28/2022   IR US GUIDE VASC ACCESS RIGHT  01/22/2022   PICC LINE PLACE PERIPHERAL ([facility] HX) Right    for use of Levaquin & Vancomycin, of note: she reports that she had a "flulike feeling"    RADIOLOGY WITH ANESTHESIA N/A 01/21/2022   Procedure: IR WITH ANESTHESIA;  Surgeon: Julieanne Cotton, MD;  Location: [facility] OR;  Service: Radiology;  Laterality: N/A;   RADIOLOGY WITH ANESTHESIA N/A 01/28/2022   Procedure: IR WITH ANESTHESIA;  Surgeon:  Radiologist, Medication, MD;  Location: [facility] OR;  Service: Radiology;  Laterality: N/A;   TONSILLECTOMY AND ADENOIDECTOMY  1975   TUBAL LIGATION  1999   Patient Active Problem List   Diagnosis Date Noted   Aphasia 03/31/2022   Snores 03/31/2022   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   UTI (urinary tract infection) 02/04/2022   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   Stroke determined by clinical assessment 01/28/2022   Middle cerebral artery embolism, left 01/28/2022   H/O ischemic left MCA stroke    Acute respiratory failure    CVA (cerebral vascular accident) 01/21/2022   NSTEMI (non-ST elevated myocardial infarction) 01/21/2022   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   HFrEF (heart failure with reduced ejection fraction) 01/21/2022   Hyperlipidemia 01/21/2022   Lumbar stenosis 01/21/2022   Stroke (cerebrum) 01/21/2022   S/P lumbar spinal fusion 11/15/2014   Lumbar discitis 11/29/2013   Discitis 11/24/2013   Diarrhea 11/17/2013   Osteomyelitis of lumbar spine 11/16/2013   Hypertension    Asthma    Anxiety     ONSET DATE: 01/28/22  REFERRING DIAG: I63.9 (ICD-10-CM) - Cerebral infarction, unspecified  THERAPY DIAG:  Hemiplegia and hemiparesis following cerebral infarction affecting right dominant side  Other lack of coordination  Muscle weakness (generalized)  Other disturbances of skin sensation  Rationale for Evaluation and Treatment Rehabilitation  SUBJECTIVE:   SUBJECTIVE STATEMENT: Pt reports it frequently feels like another finger is underneath her finger, but it is feeling more like a typical size finger over the last day or so.   Pt accompanied by: self  PERTINENT HISTORY: PMH: HTN, cervical radiculopathy, hepatitis, osteomyelitis/discitis, lumbar stenosis with foot drop anxiety, GERD, insomnia  PRECAUTIONS: Fall  WEIGHT BEARING RESTRICTIONS  was awaiting spinal fusion surgery, limit bending, twisting, lifting >10#  PAIN:  Are you  having pain? No  FALLS: Has patient fallen in last 6 months? Yes. Number of falls 2  LIVING ENVIRONMENT: Lives with: lives with their spouse Lives in: House/apartment Stairs: Yes: Internal: full flights of steps; on right going up and External: 7 steps; on left going up Has following equipment at home: Single point cane, Walker - 2 wheeled, Walker - 4 wheeled, shower chair, and bed side commode  PLOF: Independent  PATIENT GOALS I want my arm back.    OBJECTIVE:   HAND DOMINANCE: Right  ADLs: Transfers/ambulation related to ADLs: Mod I with no use of  Eating: Husband assists with cutting, feeding self with L hand Grooming: utilizing L hand with opening and brushing teeth UB Dressing: unable to manage buttons, otherwise Mod I LB Dressing: unable to tie shoes, just slipping on toes Toileting: Mod I with use of L hand Bathing: Mod I, utilizing L hand to shave Tub Shower transfers: Mod I Equipment: Shower seat with back, Walk in shower, and bed side commode   IADLs: Light housekeeping: laundry and hanging up clothes, wipes counters/sink, needs assist to fold clothing Meal Prep: no, did not cook pre-stroke Medication management: husband opens pill bottles Handwriting: unable  MOBILITY STATUS: Needs Assist: Supervision - Mod I without use of AD  POSTURE COMMENTS:  No Significant postural limitations  ACTIVITY TOLERANCE: Activity tolerance: WNL for tasks assessed on eval  FUNCTIONAL OUTCOME MEASURES: FOTO: 49  UPPER EXTREMITY ROM     Active ROM Right eval Left eval  Shoulder flexion WNL WNL  Shoulder abduction    Shoulder adduction    Shoulder extension    Shoulder internal rotation    Shoulder external rotation    Elbow flexion    Elbow extension -5 WNL  Wrist flexion 38 WNL  Wrist extension 50 WNL  Wrist ulnar deviation    Wrist radial deviation    Wrist pronation    Wrist supination    (Blank rows = not tested)   UPPER EXTREMITY MMT:     MMT  Right eval Left eval  Shoulder flexion 4+/5   Shoulder abduction    Shoulder adduction    Shoulder extension    Shoulder internal rotation    Shoulder external rotation    Middle trapezius    Lower trapezius    Elbow flexion 4+/5   Elbow extension 4+/5   Wrist flexion    Wrist extension    Wrist ulnar deviation    Wrist radial deviation    Wrist pronation    Wrist supination    (Blank rows = not tested)  HAND FUNCTION: Loose gross grasp, unable to squeeze dynamometer 06/06/22: R: 15#  COORDINATION: Box and Blocks:  Right 10 blocks, Left 53 blocks Modified performance with RUE with therapist holding blocks in palm and pt able to complete gross  grasp to pick up one at a time from OT palm of hand, minimal hand opening/closing 04/02/22: Right: 16 blocks  SENSATION: Light touch: WFL Stereognosis: Impaired  Hot/Cold: Impaired  Proprioception: Impaired   COGNITION: Overall cognitive status: Within functional limits for tasks assessed  VISION: Subjective report: Pt reports that her R eye does not close as well and that she feels that her L eye is compensating for her R eye. Baseline vision: Wears glasses all the time  VISION ASSESSMENT: To be further assessed in functional context  --------------------------------------------------------------------------------------------------------------------------------------------------------------------- (objective measures above completed at initial evaluation unless otherwise dated)  TODAY'S TREATMENT:  08/26/22 Kinesiotape: applied kinesiotape to R forearm (anchored at lateral epicondyle and applied to forearm with 20-35% tension past DIP with fist in flexion and slightly deviated radially) and fingers to facilitate finger extension.  Educated on typical length of wear and purpose of kinesiotape. Box and blocks: focus on picking up and releasing 1" blocks.  Engaged in activity with focus on finger extension during release.  Completed  assessment with LUE: 28 blocks.   9 hole peg test: 5 pegs in in .  Pt sliding pegs up edge of container to increase success with being able to pick up pieces, as unable to do with just pincer grip.  Pt utilizing long finger to rotate peg to allow for good alignment of peg into peg hole.  Pt occasionally "dropping" pegs into pincer grasp.  Pt continues to demonstrate decreased coordination and pinch strength for manipulation as needed for task.    08/19/22 Therapeutic activity: engaged in Connect 4 in typical setup with focus on pinch, rotation, and wrist extension to pick up and place pieces in to connect 4 grid.  Pt demonstrating improved motor control, despite persistent spasticity in fingers limiting ROM and sensation.  Transitioned grid to vertical on side to simulate coin slot.  Pt able to pick up and rotate pegs to place into slot with improving isolated finger movement to push into slot.  Engaged in [employer] with good motor control.  Attempted to increase challenge to incorporating rotation in finger tips when removing, however pt unable to rotate pieces. Manual therapy: massage to index finger in retrograde massage with circular movements while educating on massaging from distal to proximal method to facilitate movement of fluid.  Engaged in discussion of possibility of botox intervention for continued motor recovery. Grip strength: 1,2 # resistance clothespins with RUE for mid functional reaching and sustained pinch. Pt demonstrating good mobility to pick up clothespins and good pinch strength to place and remove.     08/11/22 NMES: attempted finger extension with NMES to further facilitate extension, pt reporting feeling slight sensation of stimulation and observing increased finger extension, however with difficulty syncing with functional movement and reports no sensation to time activation up with functional movement.  Applied NMES to forearm extenders.  Pt with no c/o  pain at beginning or end of session.  No adverse reactions after treatment and skin is intact. Ratio 1:3 Rate 35 pps Waveform- Asymmetric Ramp 1.0 Pulse 300 Intensity- 130 Duration -   5 min Therapeutic activity: engaged in placing and removing large grip pegs in resistance peg board.  Pt demonstrating improved grasp and manipulation of pegs when placing and removing.  Pt incorporating wrist movements with radial deviation, extension, and finger movements with tripod pinch. Self-feeding: engaged in simulated self-feeding with fork with "stabbing" small pieces of putty to simulate food.  Pt demonstrating increased grasp and control when utilizing  built up handle over fork handle, however reports that it does not fit on her fork and she will not use it in public.    PATIENT EDUCATION: Education details: ongoing education with focus on coordination and progressive ROM Person educated: Patient Education method: Explanation, Demonstration, and Verbal cues Education comprehension: verbalized understanding and needs further education   HOME EXERCISE PROGRAM: Fine motor coordination handout  Access Code: [email or web address] URL: [email or web address] Date: 08/04/2022 Prepared by: [facility] [place] - Outpatient  Rehab - [facility]  Exercises - Seated Digit Tendon Gliding  - 2 x daily - 1 sets - 10 reps - Thumb AROM MP Blocking  - 2 x daily - 2 sets - 10 reps - Thumb Radial Adduction with Thumb Flexion AROM on Table  - 2 x daily - 2 sets - 10 reps - Wrist Prayer Stretch  - 2 x daily - 2 sets - 10 reps - 5-10 sec hold - Wrist Flexion and Extension with Resistance Bar  - 1 x daily - 3 x weekly - 2 sets - 10 reps - Wrist Extension with Resistance  - 1 x daily - 3 x weekly - 2 sets - 5 reps - 3 sec hold - Wrist Flexion with Resistance  - 1 x daily - 3 x weekly - 2 sets - 5 reps - 3 sec hold - Seated Wrist Radial Deviation with Dumbbell  - 1 x daily - 3 x weekly - 2 sets - 10 reps - Seated  Wrist Extension with Dumbbell  - 1 x daily - 3 x weekly - 2 sets - 10 reps - Forearm Supination with Dumbbell  - 1 x daily - 3 x weekly - 2 sets - 10 reps - Seated Single Arm Shoulder External Rotation  - 1 x daily - 3 x weekly - 2 sets - 10 reps - Shoulder Internal and External Rotation in Abduction  - 1 x daily - 3 x weekly - 2 sets - 10 reps      GOALS: Goals reviewed with patient? Yes   SHORT TERM GOALS: Target date: 08/08/22  1.  Pt will demonstrate improved external rotation and sustained grasp to complete grooming of hair with increased ease an independence.  Baseline:  Goal status: Met - 08/04/22  2.   Pt will demonstrate improved UE functional use for ADLs as evidenced by increasing box/ blocks score by 4 blocks with RUE.  Baseline: R: 28 blocks and L: 53  Goal status: Met - 32 blocks on 08/04/22  3.   Pt will demonstrate improved functional use of RUE to allow for increased ease and independence with laundering (washing, folding, ironing) clothes.    Baseline:  Goal status: Met - 08/04/22   LONG TERM GOALS: Target date: 08/29/22  1.  Pt will demonstrate use of dominant RUE as gross assist to aid in managing clothing fasteners (tying shoe laces and buttons). Baseline:  Goal status: IN PROGRESS  2.  Pt will demonstrate improved coordination as needed to engage in self-feeding with dominant RUE with built up handle PRN as evidenced by decreasing PPT#2 (self feeding) by 3 secs Baseline: TBD Goal status: IN PROGRESS  3.  Pt will demonstrate increased functional reach and grasp to obtain or place moderate weight item from/on moderate height shelf to simulate IADLs. Baseline:  Goal status: IN PROGRESS  4. Pt will increase RUE pinch strength by 3# to increase success with holding and manipulating writing and/or eating utensil.  Baseline:   Goal status:  IN PROGRESS  5.  Pt will be able to complete 9 hole peg test with RUE in 2 min time limit to demonstrate increased fine  motor control as needed for ADLs/IADLs.  Baseline:   Goal status: IN PROGRESS   ASSESSMENT:  CLINICAL IMPRESSION: Pt continues to demonstrate decreased tripod grasp due to impaired sensation, pinch strength, and coordination, impacting fine motor control with attempts at 9 hole peg test and self-feeding.  Discussed possibility of Botox for spasticity to continue to maximize her recovery and plan to increase therapy to 2x/week post botox.  Engaged in lengthy discussion about typical recovery post CVA and continued engagement in HEP as well as alternative adjunct treatments to continue to promote recovery.  PERFORMANCE DEFICITS in functional skills including ADLs, IADLs, coordination, dexterity, sensation, ROM, strength, FMC, GMC, balance, body mechanics, decreased knowledge of use of DME, and UE functional use and psychosocial skills including environmental adaptation, habits, and routines and behaviors.   IMPAIRMENTS are limiting patient from ADLs, IADLs, and work.   COMORBIDITIES may have co-morbidities  that affects occupational performance. Patient will benefit from skilled OT to address above impairments and improve overall function.  MODIFICATION OR ASSISTANCE TO COMPLETE EVALUATION: Min-Moderate modification of tasks or assist with assess necessary to complete an evaluation.  OT OCCUPATIONAL PROFILE AND HISTORY: Detailed assessment: Review of records and additional review of physical, cognitive, psychosocial history related to current functional performance.  CLINICAL DECISION MAKING: Moderate - several treatment options, min-mod task modification necessary  REHAB POTENTIAL: Good  EVALUATION COMPLEXITY: Moderate    PLAN: OT FREQUENCY: 1-2x/week  OT DURATION: 6 weeks  PLANNED INTERVENTIONS: self care/ADL training, therapeutic exercise, therapeutic activity, neuromuscular re-education, manual therapy, passive range of motion, balance training, functional mobility training,  splinting, electrical stimulation, ultrasound, compression bandaging, moist heat, cryotherapy, patient/family education, psychosocial skills training, energy conservation, coping strategies training, and DME and/or AE instructions  RECOMMENDED OTHER SERVICES: N/A  CONSULTED AND AGREED WITH PLAN OF CARE: Patient and family member/caregiver  PLAN FOR NEXT SESSION: Coordination, wrist movements, external/internal rotation, functional reach, various pinches, pinch strengthening, e-stim in conjunction with coordination/reach. Self-feeding and/or handwriting.   Rosalio Loud, OTR/L 08/26/2022, 4:25 PM

## 2022-09-01 ENCOUNTER — Ambulatory Visit (INDEPENDENT_AMBULATORY_CARE_PROVIDER_SITE_OTHER): Payer: Medicare Other | Admitting: Neurology

## 2022-09-01 DIAGNOSIS — E663 Overweight: Secondary | ICD-10-CM

## 2022-09-01 DIAGNOSIS — R0683 Snoring: Secondary | ICD-10-CM

## 2022-09-01 DIAGNOSIS — R471 Dysarthria and anarthria: Secondary | ICD-10-CM

## 2022-09-01 DIAGNOSIS — G4720 Circadian rhythm sleep disorder, unspecified type: Secondary | ICD-10-CM

## 2022-09-01 DIAGNOSIS — I63512 Cerebral infarction due to unspecified occlusion or stenosis of left middle cerebral artery: Secondary | ICD-10-CM

## 2022-09-01 DIAGNOSIS — Z9189 Other specified personal risk factors, not elsewhere classified: Secondary | ICD-10-CM

## 2022-09-02 ENCOUNTER — Ambulatory Visit: Payer: Federal, State, Local not specified - PPO

## 2022-09-02 DIAGNOSIS — R482 Apraxia: Secondary | ICD-10-CM

## 2022-09-02 DIAGNOSIS — R471 Dysarthria and anarthria: Secondary | ICD-10-CM

## 2022-09-02 DIAGNOSIS — R208 Other disturbances of skin sensation: Secondary | ICD-10-CM | POA: Diagnosis not present

## 2022-09-02 DIAGNOSIS — R4701 Aphasia: Secondary | ICD-10-CM

## 2022-09-02 DIAGNOSIS — I69351 Hemiplegia and hemiparesis following cerebral infarction affecting right dominant side: Secondary | ICD-10-CM | POA: Diagnosis not present

## 2022-09-02 DIAGNOSIS — M6281 Muscle weakness (generalized): Secondary | ICD-10-CM | POA: Diagnosis not present

## 2022-09-02 DIAGNOSIS — R278 Other lack of coordination: Secondary | ICD-10-CM | POA: Diagnosis not present

## 2022-09-02 DIAGNOSIS — R41841 Cognitive communication deficit: Secondary | ICD-10-CM | POA: Diagnosis not present
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
## 2022-09-04 NOTE — Procedures (Signed)
Physician Interpretation:     [facility] POLYSOMNOGRAPHY  INTERPRETATION REPORT   STUDY DATE:  09/01/2022     PATIENT NAME:  Chloe Johnson         DATE OF BIRTH:  [birth year]  PATIENT ID:  [id number]    TYPE OF STUDY:  PSG  READING PHYSICIAN: Huston Foley, MD, PhD   SCORING TECHNICIAN: Margaretann Loveless, RPSGT  Referred by: Dr. 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 Her Epworth sleepiness score is 0 out of 24.  Height: 60 in Weight: 145 lb (BMI 28) Neck Size: 14 in   MEDICATIONS: Tylenol, Flexeril, Pepcid, Ferrous Sulfate, Claritin, Toprol - XL, Myrbetriq, Depade, Klor - Con, Lyrica, Crestor, Entresto, Topamac, Coumadin  TECHNICAL DESCRIPTION: A registered sleep technologist was in attendance for the duration of the recording.  Data collection, scoring, video monitoring, and reporting were performed in compliance with the AASM Manual for the Scoring of Sleep and Associated Events; (Hypopnea is scored based on the criteria listed in Section VIII D. 1b in the AASM Manual V2.6 using a 4% oxygen desaturation rule or Hypopnea is scored based on the criteria listed in Section VIII D. 1a in the AASM Manual V2.6 using 3% oxygen desaturation and /or arousal rule).   SLEEP CONTINUITY AND SLEEP ARCHITECTURE:  Lights-out was at 21:58: and lights-on at  05:05:, with  a total recording time of 7 hours, 7 min. Total sleep time ( TST) was 329.5 minutes with a decreased sleep efficiency at 77.2%. There was  10.6% REM sleep.   BODY POSITION:  TST was divided  between the following sleep positions: 45.7% supine;  54.3% lateral;  0% prone. Duration of total  sleep and percent of total sleep in their respective position is as follows: supine 150 minutes (46%), non-supine 179 minutes (54%); right 00 minutes (0%), left 179 minutes (54%), and prone 00 minutes (0%).  Total supine REM sleep time was 35 minutes (100% of total REM sleep).  Sleep latency was increased at 39.5 minutes.  REM sleep latency was increased at 167.0 minutes. Of the total sleep time, the percentage of stage N1 sleep was 15.9%, which is increased, stage N2 sleep was 60%, which is increased, stage N3 sleep was 13.2%, which is mildly reduced, and REM sleep was 10.6%, which is reduced. Wake after sleep onset (WASO) time accounted for 58 minutes with sleep fragmentation noted, ranging from minimal to moderate.   RESPIRATORY MONITORING:   Based on CMS criteria (using a 4% oxygen desaturation rule for scoring hypopneas), there were 1 apneas (1 obstructive; 0 central; 0 mixed), and 6 hypopneas.  Apnea index was 0.2. Hypopnea index was 1.1. The apnea-hypopnea index was 1.3/hour overall (2.4 supine, 0 non-supine; 6.9 REM, 6.9 supine REM).  There were 0 respiratory effort-related arousals (RERAs).  The RERA index was 0 events/h. Total respiratory disturbance index (RDI) was 1.3 events/h. RDI results showed: supine RDI  2.4 /h; non-supine RDI 0.3 /h; REM RDI 6.9 /h, supine REM RDI 6.9 /h.   Based on AASM criteria (using a 3% oxygen desaturation and /or arousal rule for scoring hypopneas), there were 1 apneas (1 obstructive; 0 central; 0 mixed), and 6 hypopneas. Apnea index was 0.2. Hypopnea  index was 1.1. The apnea-hypopnea index was 1.3 overall (2.4 supine, 0 non-supine; 6.9 REM, 6.9 supine REM).  There were 0 respiratory effort-related arousals (RERAs).  The RERA index was 0 events/h. Total respiratory disturbance index (RDI) was 1.3 events/h. RDI results showed: supine RDI  2.4 /h; non-supine RDI 0.3 /h; REM RDI 6.9 /h, supine REM RDI 6.9 /h.   OXIMETRY: Oxyhemoglobin Saturation Nadir during sleep was  at  88% from a mean of 94%.  Of the Total sleep time (TST)   hypoxemia (=<88%) was present for  1.0 minutes, or 0.3% of total sleep time.   LIMB MOVEMENTS: There were 0 periodic limb movements of sleep (0.0/hr), of which 0 (0.0/hr) were associated with an arousal.  AROUSAL: There were 33 arousals in total, for an arousal index of 6 arousals/hour.  Of these, 2 were identified as respiratory-related arousals (0 /h), 0 were PLM-related arousals (0 /h), and 55 were non-specific arousals (10 /h).  EEG: Review of the EEG showed no abnormal electrical discharges and symmetrical bihemispheric findings.    EKG: The EKG revealed normal sinus rhythm (NSR). The average heart rate during sleep was 53 bpm.   AUDIO/VIDEO REVIEW: The audio and video review did not show any abnormal or unusual behaviors, movements, phonations or vocalizations. The patient took no restroom breaks. Snoring was mild and intermittent.  POST-STUDY QUESTIONNAIRE: Post study, the patient indicated, that sleep was worse than usual.   IMPRESSION:  1. Primary Snoring 2. Dysfunctions associated with sleep stages or arousal from sleep  RECOMMENDATIONS:  1. This study does not demonstrate any significant obstructive or central sleep disordered breathing with an AHI of less than 5/hour - Her AHI was 1.3/hour - and oxygen desaturation nadir was 88% for the night. Mild intermittent snoring was noted. Treatment with a positive airway pressure device, such as CPAP or autoPAP is not indicated. Some weight loss and avoiding the supine sleep position may aid in reducing her snoring.  2. This study shows sleep fragmentation and abnormal sleep stage percentages; these are nonspecific findings and per se do not signify an intrinsic sleep disorder or a cause for the patient's sleep-related symptoms. Causes include (but are not limited to) the first night effect of the sleep study, circadian rhythm disturbances, medication effect or an underlying mood  disorder or medical problem.  3. The patient should be cautioned not to drive, work at heights, or operate dangerous or heavy equipment when tired or sleepy. Review and reiteration of good sleep hygiene measures should be pursued with any patient. 4. The patient will be advised to follow up with the referring provider, who will be notified of the test results.   I certify that I have reviewed the entire raw data recording prior to the issuance of this report in accordance with the Standards of Accreditation of the American Academy of Sleep Medicine (AASM).  Huston Foley, MD, PhD Medical Director, [facility]) Diplomat, ABPN (Neurology and Sleep)               Technical Report:   General Information  Name: Chloe Johnson, Chloe Johnson BMI: 28.32 Physician: Huston Foley, MD  ID: [id number] Height: 60.0 in Technician: Margaretann Loveless, RPSGT  Sex: Female Weight: 145.0 lb Record: [id number]  Age: 67 [1955/11/15] Date: 09/01/2022    Medical & Medication History    67 year old female with an underlying medical history of left MCA stroke in September 2023, NSTEMI, mural thrombus, hypertension, history of osteomyelitis, anxiety, low back pain,  exertional asthma, reflux disease, history of kidney stone, neck pain and overweight state, who reports snoring and difficulty sleeping at night.  Tylenol, Flexeril, Pepcid, Ferrous Sulfate, Claritin, Toprol - XL, Myrbetriq, Depade, Klor - Con, Lyrica, Crestor, Entresto, Topamac, Coumadin   Sleep Disorder      Comments   The patient came into the lab for a PSG. Per the patient she took CBD gummies and Flexeril prior to arrival at sleep lab. The patient had no restroom breaks. EKG kept in NSR. Mild snoring. All sleep stages witnessed. Respiratory events scored with a 4% desat. Slept lateral and supine. AHI was 0.5 after 2 hrs of TST. Respiratory events in REM while supine.     Lights out: 09:58:46 PM Lights on: 05:05:29 AM    Time Total Supine Side Prone Upright  Recording (TRT) 7h 7.30m 3h 16.57m 3h 51.13m 0h 0.63m 0h 0.64m  Sleep (TST) 5h 29.30m 2h 30.31m 2h 59.90m 0h 0.23m 0h 0.75m   Latency N1 N2 N3 REM Onset Per. Slp. Eff.  Actual 0h 0.48m 0h 26.36m 0h 58.1m 2h 47.41m 0h 39.74m 1h 25.76m 77.17%   Stg Dur Wake N1 N2 N3 REM  Total 97.5 52.5 198.5 43.5 35.0  Supine 45.5 24.0 91.5 0.0 35.0  Side 52.0 28.5 107.0 43.5 0.0  Prone 0.0 0.0 0.0 0.0 0.0  Upright 0.0 0.0 0.0 0.0 0.0   Stg % Wake N1 N2 N3 REM  Total 22.8 15.9 60.2 13.2 10.6  Supine 10.7 7.3 27.8 0.0 10.6  Side 12.2 8.6 32.5 13.2 0.0  Prone 0.0 0.0 0.0 0.0 0.0  Upright 0.0 0.0 0.0 0.0 0.0     Apnea Summary Sub Supine Side Prone Upright  Total 1 Total 1 0 1 0 0    REM 0 0 0 0 0    NREM 1 0 1 0 0  Obs 1 REM 0 0 0 0 0    NREM 1 0 1 0 0  Mix 0 REM 0 0 0 0 0    NREM 0 0 0 0 0  Cen 0 REM 0 0 0 0 0    NREM 0 0 0 0 0   Rera Summary Sub Supine Side Prone Upright  Total 0 Total 0 0 0 0 0    REM 0 0 0 0 0    NREM 0 0 0 0 0   Hypopnea Summary Sub Supine Side Prone Upright  Total 6 Total 6 6 0 0 0    REM 4 4 0 0 0    NREM 2 2 0 0 0   4% Hypopnea Summary Sub Supine Side Prone Upright  Total (4%) 6 Total 6 6 0 0 0    REM 4 4 0 0 0    NREM 2 2 0 0 0     AHI Total Obs Mix Cen  1.27 Apnea 0.18 0.18 0.00 0.00   Hypopnea 1.09 -- -- --  1.27 Hypopnea (4%) 1.09 -- -- --    Total Supine Side Prone Upright  Position AHI 1.27 2.39 0.34 0.00 0.00  REM AHI 6.86   NREM AHI 0.61   Position RDI 1.27 2.39 0.34 0.00 0.00  REM RDI 6.86   NREM RDI 0.61    4% Hypopnea Total Supine Side Prone Upright  Position AHI (4%) 1.27 2.39 0.34 0.00 0.00  REM AHI (4%) 6.86   NREM AHI (4%) 0.61   Position RDI (4%) 1.27 2.39 0.34 0.00 0.00  REM RDI (4%) 6.86  NREM RDI (4%) 0.61    Desaturation Information Threshold: 2% <100% <90% <80% <70% <60% <50% <40%  Supine 59.0 3.0 0.0 0.0 0.0 0.0 0.0  Side 43.0 3.0 0.0 0.0 0.0 0.0 0.0  Prone 0.0 0.0 0.0 0.0 0.0 0.0 0.0   Upright 0.0 0.0 0.0 0.0 0.0 0.0 0.0  Total 102.0 6.0 0.0 0.0 0.0 0.0 0.0  Index 15.8 0.9 0.0 0.0 0.0 0.0 0.0   Threshold: 3% <100% <90% <80% <70% <60% <50% <40%  Supine 25.0 3.0 0.0 0.0 0.0 0.0 0.0  Side 15.0 1.0 0.0 0.0 0.0 0.0 0.0  Prone 0.0 0.0 0.0 0.0 0.0 0.0 0.0  Upright 0.0 0.0 0.0 0.0 0.0 0.0 0.0  Total 40.0 4.0 0.0 0.0 0.0 0.0 0.0  Index 6.2 0.6 0.0 0.0 0.0 0.0 0.0   Threshold: 4% <100% <90% <80% <70% <60% <50% <40%  Supine 20.0 3.0 0.0 0.0 0.0 0.0 0.0  Side 12.0 1.0 0.0 0.0 0.0 0.0 0.0  Prone 0.0 0.0 0.0 0.0 0.0 0.0 0.0  Upright 0.0 0.0 0.0 0.0 0.0 0.0 0.0  Total 32.0 4.0 0.0 0.0 0.0 0.0 0.0  Index 5.0 0.6 0.0 0.0 0.0 0.0 0.0   Threshold: 3% <100% <90% <80% <70% <60% <50% <40%  Supine 25 3 0 0 0 0 0  Side 15 1 0 0 0 0 0  Prone 0 0 0 0 0 0 0  Upright 0 0 0 0 0 0 0  Total 40 4 0 0 0 0 0   Awakening/Arousal Information # of Awakenings 39  Wake after sleep onset 58.3652m  Wake after persistent sleep 35.2952m   Arousal Assoc. Arousals Index  Apneas 0 0.0  Hypopneas 2 0.4  Leg Movements 0 0.0  Snore 0 0.0  PTT Arousals 0 0.0  Spontaneous 55 10.0  Total 57 10.4  Leg Movement Information PLMS LMs Index  Total LMs during PLMS 0 0.0  LMs w/ Microarousals 0 0.0   LM LMs Index  w/ Microarousal 0 0.0  w/ Awakening 0 0.0  w/ Resp Event 0 0.0  Spontaneous 0 0.0  Total 0 0.0     Desaturation threshold setting: 3% Minimum desaturation setting: 10 seconds SaO2 nadir: 77% The longest event was a 79 sec obstructive Hypopnea with a minimum SaO2 of 90%. The lowest SaO2 was 88% associated with a 23 sec obstructive Hypopnea. EKG Rates EKG Avg Max Min  Awake 65 111 49  Asleep 53 72 48  EKG Events: N/A

## 2022-09-09 ENCOUNTER — Encounter
Payer: Federal, State, Local not specified - PPO | Attending: Registered Nurse | Admitting: Physical Medicine and Rehabilitation

## 2022-09-09 ENCOUNTER — Telehealth: Payer: Self-pay | Admitting: Neurology

## 2022-09-09 VITALS — BP 113/75 | HR 65 | Ht 60.0 in | Wt 151.0 lb

## 2022-09-09 DIAGNOSIS — N390 Urinary tract infection, site not specified: Secondary | ICD-10-CM | POA: Diagnosis not present

## 2022-09-09 DIAGNOSIS — N3000 Acute cystitis without hematuria: Secondary | ICD-10-CM | POA: Diagnosis not present

## 2022-09-09 DIAGNOSIS — Z8673 Personal history of transient ischemic attack (TIA), and cerebral infarction without residual deficits: Secondary | ICD-10-CM | POA: Diagnosis not present

## 2022-09-09 DIAGNOSIS — R29898 Other symptoms and signs involving the musculoskeletal system: Secondary | ICD-10-CM | POA: Insufficient documentation

## 2022-09-09 DIAGNOSIS — R4701 Aphasia: Secondary | ICD-10-CM | POA: Diagnosis not present

## 2022-09-09 DIAGNOSIS — M25511 Pain in right shoulder: Secondary | ICD-10-CM | POA: Diagnosis not present

## 2022-09-09 MED ORDER — SEMAGLUTIDE-WEIGHT MANAGEMENT 0.25 MG/0.5ML ~~LOC~~ SOAJ: 0.2500 mg | SUBCUTANEOUS | 0 refills | Status: AC

## 2022-09-09 MED ORDER — PREGABALIN 100 MG PO CAPS: 100.0000 mg | ORAL_CAPSULE | Freq: Every day | ORAL | 3 refills | Status: DC

## 2022-09-09 NOTE — Telephone Encounter (Signed)
I spoke with the patient and provided results. She verbalized understanding and expressed appreciation for the call. 

## 2022-09-09 NOTE — Telephone Encounter (Signed)
Pt requesting a call back from nurse to go over Sleep Study results.

## 2022-09-09 NOTE — Patient Instructions (Signed)
Joovv

## 2022-09-09 NOTE — Progress Notes (Signed)
Subjective:    Patient ID: Chloe Johnson, female    DOB: [birth year], 67 y.o.   MRN: [id number]  HPI:  .cpr Chloe Johnson is a 67 y.o. female who is here for f/u of right shoulder pain, Left Middle Cerebral Artery Stroke, Left Ventricular Thrombus and Essential Hypertension.   Dr. Curly Shores H&P on 01/28/2022 HPI: Chloe Johnson is a 67 y.o. 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 Leonel Ramsay, with key details confirmed by husband Chloe Johnson  is a 73 y.o.  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 She presents today after the following series events.  Around 12 PM this past Saturday, the patient noted sudden onset dizziness described as "vertigo" followed by nausea then followed by an episode of vomiting.  She next felt a sudden onset sharp chest pain with radiation to her back.  She assumed it was her cervical radiculopathy "acting up" because shortly after this event, she was holding 2 plates in her hand to feed her cats when she suddenly had no control of both upper extremities and dropped said plates.  On Sunday, she felt generally weak but then improved from all of this until this morning, around 9 AM, when she experienced word finding difficulties.  Her husband, who is at bedside, also notes she had a right facial droop transiently that lasted no more than a few minutes.  She may have had 2 subsequent episodes, as documented by ED  physician; however, all of her is told by the patient's husband that this was 1 single episode."   Her hospital course was noted for acute worsening for which she underwent thrombectomy and was discharged with only some very mild residual aphasia not detectable on examination but noticed by the patient per husband.  He notes that she has been in her normal state of health since discharge other than a visit to the ED on 9/3 with left shoulder pain which was attributed to her known cervical stenosis for which she is planned for surgery in the near future   Husband reports that she was taking his blood pressure when suddenly the blood pressure monitor fell off the table.  She tried to catch it but lost her balance had weakness in both of her legs.  Initially she got to both both arms but then became weak on the right side.  She was unable to speak at all suddenly during this event.  Work-up revealed a left ventricle apical thrombus.   Has been reports she has not missed any of her doses of medication including Eliquis   CTA was attempted to confirm recurrent acute occlusion, however due to IV infiltration and severity of her symptoms decision was made to proceed directly to IR which was discussed both with Dr. Estanislado Pandy as well as with the patient's husband   MR Brain:  IMPRESSION: 1. 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 2. No intracranial large vessel occlusion or significant stenosis. In particular, left MCA branches appear perfused and without focal stenosis.  MRA:  IMPRESSION:  1. 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 2. No intracranial large vessel occlusion or significant stenosis. In particular, left MCA branches appear perfused and without focal stenosis.  [name] COURSE Stroke:  left MCA punctate scattered infarct with left M2 occlusion s/p IR with TICI2c, embolic secondary to LV thrombus  recently on Eliquis CT no acute abnormality IR left superior M2 occlusion, inferior M3/M4 nonocclusive thrombus Status post EVT with TICI2c MRI left insular cortex and frontal lobe punctate infarcts MRA unremarkable 2D Echo EF 45 to 50% on 8/29, LV regional motion abnormality with LV thrombus LDL 150 on 8/29 HgbA1c 5 0 on 8/29 UDS positive for THC Hypercoagulable and autoimmune work up negative Heparin IV for VTE prophylaxis aspirin 81 mg daily and Eliquis (apixaban) daily prior to admission,  now on coumadin with lovenox bridge. INR goal 2.5 Continue ASA 81 per cardiology. Ongoing aggressive stroke risk factor management Therapy recommendations:  CIR Disposition:  pending   Hx of stroke and LV thrombus 8/29 admitted for nausea vomiting dizziness chest pain and then developed right facial droop and aphasia.  Found to have troponin > 300, EF 45 to 50% with LV thrombus.  CTA coronary artery showed minimal CAD.  CT no acute abnormality.  CTA head and neck showed left M2 occlusion.  CTP 0/7.  Status post EVT with TICI2c.  MRI showed left frontal Coval matter punctate infarct.  Repeat MRI showed left external capsule, insular cortex and frontal cortex punctate infarcts.  LDL 150, A1c 5.0.  UDS positive for THC.  Patient was put on heparin IV and aspirin and eventually discharged on Eliquis and aspirin as well as Crestor 20. Per family, patient has slight expressive aphasia since discharge, no physical deficit. Cardiology [name] on board, recommend coumadin with INR goal 2.5. on lovenox bridge.    Chloe Johnson was admitted to inpatient rehabilitation on 02/04/2022 and discharged home on 02/07/2022. She will begin outpatient Therapy on 02/25/2022 at [facility].  Chloe Johnson has expressive aphasia.  She was positive with THC, she reports she uses CBD products for anxiety, this was discussed with [name]. [name] stated she can continue with her CBD product, she  verbalizes understanding.   1) Right shoulder pain  -started suddenly when she was doing an activity (history is limited by her aphasia) -she feels this pain is more than just spasticity -it is very severe -she has been doing better with the increase in Lyrica to 200mg  BID -XR discussed and she would like to get this -she prefers nonsurgical treatment if possible.  -she discussed with Dr. Everardo Pacific and surgery was not recommended due to risk of re-tear -she is scheduled for steroid injection -she would be interested in starting topamax which we discussed before -pain has returned but she sees Dr. Everardo Pacific next week and she is going to ask for another injection -she thinks her shoulder is healing -positioning in car was causing the 10/10 of pain  2) Aphasia is greatly improving and she continues working with therapy -she is speaking so well today -not frustrated with speech but it is seems like it it shouldn't be this hard -she is still struggling -notes word finding difficuties -sometimes she feels frustrated as she can't find the thought she wants.  -she is going to be discharged from therapy on may 8th -she feels that she continues to improve every day -she slows down when she sees confusion on someone's face -does  not feel she can return to work yet   Pain Inventory Average Pain 4 Pain Right Now 4 My pain is constant and aching  LOCATION OF PAIN  Neck,Wrist,Hand,Back  BOWEL Number of stools per week: 14  Type of laxative Super Cleanse  BLADDER Normal    Mobility walk without assistance how many minutes can you walk? As needed ability to climb steps?  yes do you drive?  no Do you have any goals in this area?  yes  Function employed # of hrs/week 70  Neuro/Psych bowel control problems weakness numbness anxiety  Prior Studies Any changes since last visit?  no  Physicians involved in your care Any changes since last visit?  no   Family History  Problem  Relation Age of Onset   Other Mother    Other Father    Hypertension Father    Cancer Sister    Sleep apnea Sister    Social History   Socioeconomic History   Marital status: Married    Spouse name: Leonette Most   Number of children: 3   Years of education: RN   Highest education level: Not on file  Occupational History    Employer: [employer]   Tobacco Use   Smoking status: Never    Passive exposure: Past   Smokeless tobacco: Never  Vaping Use   Vaping Use: Never used  Substance and Sexual Activity   Alcohol use: Yes    Alcohol/week: 2.0 standard drinks of alcohol    Types: 2 Shots of liquor per week    Comment: "a little"; 1/2 a shot x 3 per week   Drug use: No    Comment: takes CBD gummies for sleep and anxiety   Sexual activity: Yes    Birth control/protection: Post-menopausal  Other Topics Concern   Not on file  Social History Narrative   Patient is married and lives at home with her husband Leonette Most).  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 Right handed.College education.         Social Determinants of Health   Financial Resource Strain: Not on file  Food Insecurity: Not on file  Transportation Needs: Not on file  Physical Activity: Not on file  Stress: Not on file  Social Connections: Not on file   Past Surgical History:  Procedure Laterality Date   BREAST CYST EXCISION Left 2010   "polypectomy"   IR CT HEAD LTD  01/21/2022   IR CT HEAD LTD  01/28/2022   IR PERCUTANEOUS ART THROMBECTOMY/INFUSION INTRACRANIAL INC DIAG ANGIO  01/21/2022   IR PERCUTANEOUS ART THROMBECTOMY/INFUSION INTRACRANIAL INC DIAG ANGIO  01/28/2022   IR US GUIDE VASC ACCESS RIGHT  01/22/2022   PICC LINE PLACE PERIPHERAL ([facility] HX) Right    for use of Levaquin & Vancomycin, of note: she reports that she had a "flulike feeling"    RADIOLOGY WITH ANESTHESIA N/A 01/21/2022   Procedure: IR WITH  ANESTHESIA;  Surgeon: Julieanne Cotton, MD;  Location: [facility] OR;  Service: Radiology;  Laterality: N/A;   RADIOLOGY WITH ANESTHESIA N/A 01/28/2022   Procedure: IR WITH ANESTHESIA;  Surgeon: Radiologist, Medication, MD;  Location: [facility] OR;  Service: Radiology;  Laterality: N/A;   TONSILLECTOMY AND ADENOIDECTOMY  1975   TUBAL LIGATION  1999   Past Medical History:  Diagnosis Date   Anxiety    Back pain    Discitis of lumbar region  11/16/2013   L3-4/notes 11/24/2013, arms, neck   Exertional asthma    GERD (gastroesophageal reflux disease)    Heart attack 01/18/2022   Hypertension    Kidney stones    "have always passed them"   Neck pain    Osteomyelitis 11/23/2013   osteomyelitis, discitis    Sleep concern    uses Prozac for sleep    Stroke 01/21/2022   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; left with aphasia, dysarthria, and apraxia   BP 113/75   Pulse 65   Ht 5' (1.524 m)   Wt 151 lb (68.5 kg)   SpO2 97%   BMI 29.49 kg/m   Opioid Risk Score:   Fall Risk Score:  `1  Depression screen [facility] 2/9     07/11/2022   10:19 AM 02/20/2022    9:22 AM 02/15/2014   11:06 AM 01/18/2014    2:15 PM 12/23/2013   10:14 AM  Depression screen PHQ 2/9  Decreased Interest 0 0 0 0 0  Down, Depressed, Hopeless 0 0 1 0 0  PHQ - 2 Score 0 0 1 0 0  Altered sleeping  0     Tired, decreased energy  1     Change in appetite  0     Feeling bad or failure about yourself   0     Trouble concentrating  0     Moving slowly or fidgety/restless  0     Suicidal thoughts  0     PHQ-9 Score  1     Difficult doing work/chores  Not difficult at all         Review of Systems  Musculoskeletal:  Positive for back pain and neck pain.       Wrist and hand pain   All other systems reviewed and are negative.      Objective:  Gen: no distress, normal appearing HEENT: oral mucosa pink and moist, NCAT Cardio: Reg rate Chest: normal effort, normal rate of breathing Abd: soft, non-distended Ext: no edema Psych:  pleasant, normal affect Skin: intact Neuro: Alert and oriented x3 MSK: mild spasticity in right hand fingers. Sensation intact. Thumb flexion 2/5. Improving speech rate ant intelligibility.        Assessment & Plan:  Left Middle Cerebral Artery Stroke: Continue current medication regimen. Has a scheduled appointment with Neurology. She will begin outpatient therapy at [facility]. Continue to Monitor. Continue speech therapy to continue working on aphasia Discussed that she went to the stroke recovery meeting Referred to OT for vivstem referral Left Ventricular Thrombus: Continue current medication regimen. Cardiology Following.  Essential Hypertension. Continue current medication regimen. PCP Following. Continue to Monitor.  Right shoulder pain: Discussed that MRI shows complete tear, referred to ortho Discussed extracorporeal shockwave therapy as a modality for treatment. Discussed that the device looks and feels like a massage gun and I would move it over the area of pain for about 10 minutes. The device releases sound waves to the area of pain and helps to improve blood flow and circulation to improve the healing process. Discuss that this initially induces inflammation and can sometimes cause short-term increase in pain. Discussed that we typically do three weekly treatments, but sometimes up to 6 if needed, and after 6 weeks long term benefits can sometimes be achieved.  Lyrica increased to  BID, discussed that she can continue this dose if she wants but given her unwanted weight gain we can alternatively replace her morning dose with  low dose naltrexone. She preferred to try this latter option. -discussed mechanism of action of low dose naltrexone as an opioid receptor antagonist which stimulates your body's production of its own natural endogenous opioids, helping to decrease pain. Discussed that it can also decrease T cell response and thus be helpful in decreasing  inflammation, and symptoms of brain fog, fatigue, anxiety, depression, and allergies. Discussed that this medication needs to be compounded at a compounding pharmacy and can more expensive. Discussed that I usually start at 1mg  and if this is not providing enough relief then I titrate upward on a monthly basis.   Prescribed topamax to help improve sleep and pain, discussed that it can also curb appetite Encouraged follow-up with Dr. Everardo Pacific next week  5. Overweight: -continue topamax 25mg  HS -decrease Lyrica 100mg  daily  -Educated that current weight is 150 lbs, BMI 29.29 -Educated regarding health benefits of weight loss- for pain, general health, chronic disease prevention, immune health, mental health.  -Will monitor weight every visit.  -Consider Ro[place]ois tea daily.  -Discussed the benefits of intermittent fasting. -Discussed foods that can assist in weight loss: 1) leafy greens- high in fiber and nutrients 2) dark chocolate- improves metabolism (if prefer sweetened, best to sweeten with honey instead of sugar).  3) cruciferous vegetables- high in fiber and protein 4) full fat yogurt: high in healthy fat, protein, calcium, and probiotics 5) apples- high in a variety of phytochemicals 6) nuts- high in fiber and protein that increase feelings of fullness 7) grapefruit: rich in nutrients, antioxidants, and fiber (not to be taken with anticoagulation) 8) beans- high in protein and fiber 9) salmon- has high quality protein and healthy fats 10) green tea- rich in polyphenols 11) eggs- rich in choline and vitamin D 12) tuna- high protein, boosts metabolism 13) avocado- decreases visceral abdominal fat 14) chicken (pasture raised): high in protein and iron 15) blueberries- reduce abdominal fat and cholesterol 16) whole grains- decreases calories retained during digestion, speeds metabolism 17) chia seeds- curb appetite 18) chilies- increases fat metabolism  -Discussed supplements that can be  used:  1) Metatrim 400mg  BID 30 minutes before breakfast and dinner  2) Sphaeranthus indicus and Garcinia mangostana (combinations of these and #1 can be found in capsicum and zychrome  3) green coffee bean extract 400mg  twice per day or Irvingia (african mango) 150 to 300mg  twice per day.   5. Right hand weakness:  -continue OT -discussed vivistim -discussed e-stim and that she bought her own tens unit  6) Aphasia: -discussed her progress with SLP -discussed that this is still a limitation in her returning to work  >40 minutes spent in discussion of her progress with SLP, discussed extracorporeal shockwave therapy, discussed that her finger is affected her spasticity, discussed using massage gun, lyrica, discussed ozempic, discussed her snacking, discussed avoiding added sugars

## 2022-09-10 ENCOUNTER — Ambulatory Visit: Payer: Federal, State, Local not specified - PPO | Admitting: Occupational Therapy

## 2022-09-10 ENCOUNTER — Telehealth: Payer: Self-pay

## 2022-09-10 DIAGNOSIS — I69351 Hemiplegia and hemiparesis following cerebral infarction affecting right dominant side: Secondary | ICD-10-CM

## 2022-09-10 DIAGNOSIS — R41841 Cognitive communication deficit: Secondary | ICD-10-CM | POA: Diagnosis not present

## 2022-09-10 DIAGNOSIS — R482 Apraxia: Secondary | ICD-10-CM | POA: Diagnosis not present

## 2022-09-10 DIAGNOSIS — M6281 Muscle weakness (generalized): Secondary | ICD-10-CM | POA: Diagnosis not present

## 2022-09-10 DIAGNOSIS — R278 Other lack of coordination: Secondary | ICD-10-CM

## 2022-09-10 DIAGNOSIS — R208 Other disturbances of skin sensation: Secondary | ICD-10-CM

## 2022-09-10 DIAGNOSIS — R4701 Aphasia: Secondary | ICD-10-CM | POA: Diagnosis not present

## 2022-09-10 DIAGNOSIS — R471 Dysarthria and anarthria: Secondary | ICD-10-CM | POA: Diagnosis not present

## 2022-09-10 LAB — URINALYSIS, ROUTINE W REFLEX MICROSCOPIC
Bilirubin, UA: NEGATIVE
Glucose, UA: NEGATIVE
Ketones, UA: NEGATIVE
Nitrite, UA: NEGATIVE
Specific Gravity, UA: 1.029 (ref 1.005–1.030)
Urobilinogen, Ur: 0.2 mg/dL (ref 0.2–1.0)
pH, UA: 5.5 (ref 5.0–7.5)

## 2022-09-10 LAB — MICROSCOPIC EXAMINATION
Casts: NONE SEEN /lpf
RBC, Urine: NONE SEEN /hpf (ref 0–2)
WBC, UA: >30 /hpf — AB (ref 0–5)

## 2022-09-10 NOTE — Therapy (Unsigned)
OUTPATIENT OCCUPATIONAL THERAPY  Treatment Note & Progress Note  Patient Name: Chloe Johnson MRN: [id number] DOB:March 05, 1956, 67 y.o., female Today's Date: 09/11/2022  PCP: Thana Ates, MD REFERRING PROVIDER: Horton Chin, MD   Occupational Therapy Progress Note  Dates of Reporting Period: 07/03/22 to 09/10/22  Objective Reports of Subjective Statement: Pt is continuing to demonstrate improvements in gras strength, still requiring use of built up handle for increased pinch and control with handwriting and self-feeding.  Pt continues to demonstrate difficulties with [facility] largely due to impaired sensation and decreased coordination.    Goal Update: Focus on functional strength and coordination to tie shoes, clip nails, focus on handwriting and typing, as well as self-feeding.    Plan: Continue 1x/week for 8 weeks, unless pt to get botox or other treatment to aid in recovery of function of dominant RUE then increase to 2x/week.  Reason Skilled Services are Required:  impaired sensation, decreased fine and gross motor coordination of dominant RUE impacting ease and independence with ADLs/IADLs.       OT End of Session - 09/10/22 1546     Visit Number 40    Number of Visits 57    Date for OT Re-Evaluation 11/07/22    Authorization Type Medicare A&B    OT Start Time 1403    OT Stop Time 1446    OT Time Calculation (min) 43 min    Activity Tolerance Patient tolerated treatment well    Behavior During Therapy WFL for tasks assessed/performed                                          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/16/2013   L3-4/notes 11/24/2013, arms, neck   Exertional asthma    GERD (gastroesophageal reflux disease)    Heart attack 01/18/2022   Hypertension    Kidney stones    "have always passed them"   Neck pain    Osteomyelitis 11/23/2013   osteomyelitis, discitis    Sleep  concern    uses Prozac for sleep    Stroke 01/21/2022   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; left with aphasia, dysarthria, and apraxia   Past Surgical History:  Procedure Laterality Date   BREAST CYST EXCISION Left 2010   "polypectomy"   IR CT HEAD LTD  01/21/2022   IR CT HEAD LTD  01/28/2022   IR PERCUTANEOUS ART THROMBECTOMY/INFUSION INTRACRANIAL INC DIAG ANGIO  01/21/2022   IR PERCUTANEOUS ART THROMBECTOMY/INFUSION INTRACRANIAL INC DIAG ANGIO  01/28/2022   IR US GUIDE VASC ACCESS RIGHT  01/22/2022   PICC LINE PLACE PERIPHERAL ([facility] HX) Right    for use of Levaquin & Vancomycin, of note: she reports that she had a "flulike feeling"    RADIOLOGY WITH ANESTHESIA N/A 01/21/2022   Procedure: IR WITH ANESTHESIA;  Surgeon: Julieanne Cotton, MD;  Location: [facility] OR;  Service: Radiology;  Laterality: N/A;   RADIOLOGY WITH ANESTHESIA N/A 01/28/2022   Procedure: IR WITH ANESTHESIA;  Surgeon: Radiologist, Medication, MD;  Location: [facility] OR;  Service: Radiology;  Laterality: N/A;   TONSILLECTOMY AND ADENOIDECTOMY  1975   TUBAL LIGATION  1999   Patient Active Problem List   Diagnosis Date Noted   Aphasia 03/31/2022   Snores 03/31/2022   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   UTI (urinary  tract infection) 02/04/2022   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   Stroke determined by clinical assessment 01/28/2022   Middle cerebral artery embolism, left 01/28/2022   H/O ischemic left MCA stroke    Acute respiratory failure    CVA (cerebral vascular accident) 01/21/2022   NSTEMI (non-ST elevated myocardial infarction) 01/21/2022   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   HFrEF (heart failure with reduced ejection fraction) 01/21/2022   Hyperlipidemia 01/21/2022   Lumbar stenosis 01/21/2022   Stroke (cerebrum) 01/21/2022   S/P lumbar spinal fusion 11/15/2014   Lumbar discitis 11/29/2013   Discitis 11/24/2013   Diarrhea 11/17/2013   Osteomyelitis of lumbar spine 11/16/2013   Hypertension     Asthma    Anxiety     ONSET DATE: 01/28/22  REFERRING DIAG: I63.9 (ICD-10-CM) - Cerebral infarction, unspecified   THERAPY DIAG:  Hemiplegia and hemiparesis following cerebral infarction affecting right dominant side  Other lack of coordination  Muscle weakness (generalized)  Other disturbances of skin sensation  Rationale for Evaluation and Treatment Rehabilitation  SUBJECTIVE:   SUBJECTIVE STATEMENT: Pt reports that she was able to hold a pickleball racket and serve after kinesiotaping. Pt accompanied by: self  PERTINENT HISTORY: PMH: HTN, cervical radiculopathy, hepatitis, osteomyelitis/discitis, lumbar stenosis with foot drop anxiety, GERD, insomnia  PRECAUTIONS: Fall  WEIGHT BEARING RESTRICTIONS  was awaiting spinal fusion surgery, limit bending, twisting, lifting >10#  PAIN:  Are you having pain? No  FALLS: Has patient fallen in last 6 months? Yes. Number of falls 2  LIVING ENVIRONMENT: Lives with: lives with their spouse Lives in: House/apartment Stairs: Yes: Internal: full flights of steps; on right going up and External: 7 steps; on left going up Has following equipment at home: Single point cane, Walker - 2 wheeled, Walker - 4 wheeled, shower chair, and bed side commode  PLOF: Independent  PATIENT GOALS I want my arm back.    OBJECTIVE:   HAND DOMINANCE: Right  ADLs: Transfers/ambulation related to ADLs: Mod I with no use of  Eating: Husband assists with cutting, feeding self with L hand Grooming: utilizing L hand with opening and brushing teeth UB Dressing: unable to manage buttons, otherwise Mod I LB Dressing: unable to tie shoes, just slipping on toes Toileting: Mod I with use of L hand Bathing: Mod I, utilizing L hand to shave Tub Shower transfers: Mod I Equipment: Shower seat with back, Walk in shower, and bed side commode   IADLs: Light housekeeping: laundry and hanging up clothes, wipes counters/sink, needs assist to fold clothing Meal  Prep: no, did not cook pre-stroke Medication management: husband opens pill bottles Handwriting: unable  MOBILITY STATUS: Needs Assist: Supervision - Mod I without use of AD  POSTURE COMMENTS:  No Significant postural limitations  ACTIVITY TOLERANCE: Activity tolerance: WNL for tasks assessed on eval  FUNCTIONAL OUTCOME MEASURES: FOTO: 49  UPPER EXTREMITY ROM     Active ROM Right eval Left eval  Shoulder flexion WNL WNL  Shoulder abduction    Shoulder adduction    Shoulder extension    Shoulder internal rotation    Shoulder external rotation    Elbow flexion    Elbow extension -5 WNL  Wrist flexion 38 WNL  Wrist extension 50 WNL  Wrist ulnar deviation    Wrist radial deviation    Wrist pronation    Wrist supination    (Blank rows = not tested)   UPPER EXTREMITY MMT:     MMT Right eval Left eval  Shoulder  flexion 4+/5   Shoulder abduction    Shoulder adduction    Shoulder extension    Shoulder internal rotation    Shoulder external rotation    Middle trapezius    Lower trapezius    Elbow flexion 4+/5   Elbow extension 4+/5   Wrist flexion    Wrist extension    Wrist ulnar deviation    Wrist radial deviation    Wrist pronation    Wrist supination    (Blank rows = not tested)  HAND FUNCTION: Loose gross grasp, unable to squeeze dynamometer 06/06/22: R: 15#  COORDINATION: Box and Blocks:  Right 10 blocks, Left 53 blocks Modified performance with RUE with therapist holding blocks in palm and pt able to complete gross grasp to pick up one at a time from OT palm of hand, minimal hand opening/closing 04/02/22: Right: 16 blocks  SENSATION: Light touch: WFL Stereognosis: Impaired  Hot/Cold: Impaired  Proprioception: Impaired   COGNITION: Overall cognitive status: Within functional limits for tasks assessed  VISION: Subjective report: Pt reports that her R eye does not close as well and that she feels that her L eye is compensating for her R  eye. Baseline vision: Wears glasses all the time  VISION ASSESSMENT: To be further assessed in functional context  --------------------------------------------------------------------------------------------------------------------------------------------------------------------- (objective measures above completed at initial evaluation unless otherwise dated)  TODAY'S TREATMENT:  09/10/22 Self-feeding: pt scooping 5 beans with spoon with gross grasp in 22.44 seconds.  Pt unable to complete in tripod grasp. OT applied built up/foam handle to spoon, pt then able to complete in 20.18 seconds with use of built up handle and tripod grasp.  Provided pt with additional built up handles for use at home. 9 hole peg test: placed 4 pegs in 2 mins time limit.  Pt sliding pegs up edge of container to pick up, however with decreased ability to rotate therefore picking up at tip of peg and utilizing gravity to rotate peg to then place into hole.  Pt removed 9 pegs in 36.6 seconds with 1 drop, requiring increased time to pick up one dropped peg from table top. Pinch strength: Key pinch: R: 5# and L: 16#;  3 point pinch: R: 2# and L: 13# Kinesiotape: applied kinesiotape to R forearm (anchored at lateral epicondyle and applied to forearm with 20-35% tension past DIP with fist in flexion and slightly deviated radially) and fingers to facilitate finger extension.  Reviewed typical length of wear and purpose of kinesiotape.   08/26/22 Kinesiotape: applied kinesiotape to R forearm (anchored at lateral epicondyle and applied to forearm with 20-35% tension past DIP with fist in flexion and slightly deviated radially) and fingers to facilitate finger extension.  Educated on typical length of wear and purpose of kinesiotape. Box and blocks: focus on picking up and releasing 1" blocks.  Engaged in activity with focus on finger extension during release.  Completed assessment with LUE: 28 blocks.   9 hole peg test: 5 pegs in  in .  Pt sliding pegs up edge of container to increase success with being able to pick up pieces, as unable to do with just pincer grip.  Pt utilizing long finger to rotate peg to allow for good alignment of peg into peg hole.  Pt occasionally "dropping" pegs into pincer grasp.  Pt continues to demonstrate decreased coordination and pinch strength for manipulation as needed for task.    08/19/22 Therapeutic activity: engaged in Connect 4 in typical setup with focus on pinch, rotation,  and wrist extension to pick up and place pieces in to connect 4 grid.  Pt demonstrating improved motor control, despite persistent spasticity in fingers limiting ROM and sensation.  Transitioned grid to vertical on side to simulate coin slot.  Pt able to pick up and rotate pegs to place into slot with improving isolated finger movement to push into slot.  Engaged in [employer] with good motor control.  Attempted to increase challenge to incorporating rotation in finger tips when removing, however pt unable to rotate pieces. Manual therapy: massage to index finger in retrograde massage with circular movements while educating on massaging from distal to proximal method to facilitate movement of fluid.  Engaged in discussion of possibility of botox intervention for continued motor recovery. Grip strength: 1,2 # resistance clothespins with RUE for mid functional reaching and sustained pinch. Pt demonstrating good mobility to pick up clothespins and good pinch strength to place and remove.    PATIENT EDUCATION: Education details: ongoing education with focus on coordination and progressive ROM Person educated: Patient Education method: Explanation, Demonstration, and Verbal cues Education comprehension: verbalized understanding and needs further education   HOME EXERCISE PROGRAM: Fine motor coordination handout  Access Code: [email or web address] URL: [email or web address] Date: 08/04/2022 Prepared  by: [facility] - Outpatient  Rehab - [facility]  Exercises - Seated Digit Tendon Gliding  - 2 x daily - 1 sets - 10 reps - Thumb AROM MP Blocking  - 2 x daily - 2 sets - 10 reps - Thumb Radial Adduction with Thumb Flexion AROM on Table  - 2 x daily - 2 sets - 10 reps - Wrist Prayer Stretch  - 2 x daily - 2 sets - 10 reps - 5-10 sec hold - Wrist Flexion and Extension with Resistance Bar  - 1 x daily - 3 x weekly - 2 sets - 10 reps - Wrist Extension with Resistance  - 1 x daily - 3 x weekly - 2 sets - 5 reps - 3 sec hold - Wrist Flexion with Resistance  - 1 x daily - 3 x weekly - 2 sets - 5 reps - 3 sec hold - Seated Wrist Radial Deviation with Dumbbell  - 1 x daily - 3 x weekly - 2 sets - 10 reps - Seated Wrist Extension with Dumbbell  - 1 x daily - 3 x weekly - 2 sets - 10 reps - Forearm Supination with Dumbbell  - 1 x daily - 3 x weekly - 2 sets - 10 reps - Seated Single Arm Shoulder External Rotation  - 1 x daily - 3 x weekly - 2 sets - 10 reps - Shoulder Internal and External Rotation in Abduction  - 1 x daily - 3 x weekly - 2 sets - 10 reps      GOALS: Goals reviewed with patient? Yes   SHORT TERM GOALS: Target date: 08/08/22  1.  Pt will demonstrate improved external rotation and sustained grasp to complete grooming of hair with increased ease an independence.  Baseline:  Goal status: Met - 08/04/22  2.   Pt will demonstrate improved UE functional use for ADLs as evidenced by increasing box/ blocks score by 4 blocks with RUE.  Baseline: R: 28 blocks and L: 53  Goal status: Met - 32 blocks on 08/04/22  3.   Pt will demonstrate improved functional use of RUE to allow for increased ease and independence with laundering (washing, folding, ironing) clothes.    Baseline:  Goal status:  Met - 08/04/22  NEW SHORT TERM GOALS: Target date: 10/10/22  1.  Pt will verbalize understanding of alternative treatment methods to aid in motor recovery and decrease of  spasticity.  Baseline:  Goal status: INITIAL  2.  Pt and caregiver will demonstrate ability to apply kinesiotape to RUE appropriately to facilitate decreased edema and facilitate increased motor recovery.  Baseline:  Goal status: INITIAL   LONG TERM GOALS: Target date: 08/29/22  1.  Pt will demonstrate use of dominant RUE as gross assist to aid in managing clothing fasteners (tying shoe laces and buttons). Baseline:  Goal status: not met - utilizing as stabilizer 09/10/22  2.  Pt will demonstrate improved coordination as needed to engage in self-feeding with dominant RUE with built up handle PRN as evidenced by decreasing PPT#2 (self feeding) by 3 secs Baseline: TBD Goal status: Met - 09/10/22  3.  Pt will demonstrate increased functional reach and grasp to obtain or place moderate weight item from/on moderate height shelf to simulate IADLs. Baseline:  Goal status: Met - 09/10/22  4. Pt will increase RUE pinch strength by 3# to increase success with holding and manipulating writing and/or eating utensil.  Baseline:   Goal status: Not met - 09/10/22  5.  Pt will be able to complete 9 hole peg test with RUE in 2 min time limit to demonstrate increased fine motor control as needed for ADLs/IADLs.  Baseline:   Goal status: Not met - 09/10/22  NEW LONG TERM GOALS:  11/07/22  1.  Pt will demonstrate use of dominant RUE as gross assist to aid in managing clothing fasteners (tying shoe laces and buttons). Baseline:  Goal status: INITIAL  2.  Pt will increase RUE pinch strength by 3# to increase success with holding and manipulating writing and/or eating utensil.  Baseline: key pinch: 5#, 3 point pinch: 2#  Goal status: INITIAL  3.  Pt will be able to complete 9 hole peg test with RUE in 2 min time limit to demonstrate increased fine motor control as needed for ADLs/IADLs.  Baseline: 4-5 pegs in 2 min time limit  Goal status: INITIAL  4.  Pt will demonstrate increased functional reach,  internal rotation, and grasp strength with RUE to tuck in shirt and aid in hygiene post toileting.    Baseline:   Goal status: INITIAL   ASSESSMENT:  CLINICAL IMPRESSION: Pt continues to demonstrate decreased tripod grasp due to impaired sensation, pinch strength, and coordination, impacting fine motor control with attempts at 9 hole peg test and self-feeding. Pt demonstrating improvements with use of built up/foam handle.  Pt continues to voice motivation to regain as much function in RUE as possible and open to alternative treatment methods.  PERFORMANCE DEFICITS in functional skills including ADLs, IADLs, coordination, dexterity, sensation, ROM, strength, FMC, GMC, balance, body mechanics, decreased knowledge of use of DME, and UE functional use and psychosocial skills including environmental adaptation, habits, and routines and behaviors.   IMPAIRMENTS are limiting patient from ADLs, IADLs, and work.   COMORBIDITIES may have co-morbidities  that affects occupational performance. Patient will benefit from skilled OT to address above impairments and improve overall function.  MODIFICATION OR ASSISTANCE TO COMPLETE EVALUATION: Min-Moderate modification of tasks or assist with assess necessary to complete an evaluation.  OT OCCUPATIONAL PROFILE AND HISTORY: Detailed assessment: Review of records and additional review of physical, cognitive, psychosocial history related to current functional performance.  CLINICAL DECISION MAKING: Moderate - several treatment options, min-mod task modification necessary  REHAB POTENTIAL: Good  EVALUATION COMPLEXITY: Moderate    PLAN: OT FREQUENCY: 1-2x/week  OT DURATION: 8 weeks  PLANNED INTERVENTIONS: self care/ADL training, therapeutic exercise, therapeutic activity, neuromuscular re-education, manual therapy, passive range of motion, balance training, functional mobility training, splinting, electrical stimulation, ultrasound, compression  bandaging, moist heat, cryotherapy, patient/family education, psychosocial skills training, energy conservation, coping strategies training, and DME and/or AE instructions  RECOMMENDED OTHER SERVICES: N/A  CONSULTED AND AGREED WITH PLAN OF CARE: Patient and family member/caregiver  PLAN FOR NEXT SESSION: Coordination, wrist movements, external/internal rotation, functional reach, various pinches, pinch strengthening, e-stim in conjunction with coordination/reach. Self-feeding and/or handwriting.   Rosalio Loud, OTR/L 09/11/2022, 10:53 AM

## 2022-09-10 NOTE — Telephone Encounter (Signed)
PA FOR [facility] SENT IN COVER MY MEDS SUBMITTED  IN COVER MY MEDS ON 09/10/2022.

## 2022-09-11 ENCOUNTER — Encounter (HOSPITAL_BASED_OUTPATIENT_CLINIC_OR_DEPARTMENT_OTHER): Payer: Federal, State, Local not specified - PPO | Admitting: Physical Medicine and Rehabilitation

## 2022-09-11 DIAGNOSIS — N3000 Acute cystitis without hematuria: Secondary | ICD-10-CM | POA: Diagnosis not present

## 2022-09-11 MED ORDER — NITROFURANTOIN MONOHYD MACRO 100 MG PO CAPS: 100.0000 mg | ORAL_CAPSULE | Freq: Two times a day (BID) | ORAL | 0 refills | Status: DC

## 2022-09-11 NOTE — Progress Notes (Signed)
Subjective:    Patient ID: Chloe Johnson, female    DOB: 07/24/1955, 67 y.o.   MRN: [id number]  HPI:  An audio/video tele-health visit is felt to be the most appropriate encounter for this patient at this time. This is a follow up tele-visit via phone. The patient is at home. MD is at office. Prior to scheduling this appointment, our staff discussed the limitations of evaluation and management by telemedicine and the availability of in-person appointments. The patient expressed understanding and agreed to proceed.   Chloe Johnson is a 67 y.o. female who is here for f/u of right shoulder pain, Left Middle Cerebral Artery Stroke, Left Ventricular Thrombus and Essential Hypertension.   Dr. Iver Johnson H&P on 01/28/2022 HPI: Chloe Johnson is a 67 y.o. 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 Amada Johnson, with key details confirmed by husband Chloe Johnson  is a 22 y.o.  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 She presents today after the following series events.  Around 12 PM this past Saturday, the patient noted sudden onset dizziness described as "vertigo" followed by nausea then followed by an episode of vomiting.  She next felt a sudden onset sharp chest pain with radiation to her back.  She assumed it was her cervical radiculopathy "acting up" because shortly after this event, she was holding 2 plates in her hand to feed her cats when  she suddenly had no control of both upper extremities and dropped said plates.  On Sunday, she felt generally weak but then improved from all of this until this morning, around 9 AM, when she experienced word finding difficulties.  Her husband, who is at bedside, also notes she had a right facial droop transiently that lasted no more than a few minutes.  She may have had 2 subsequent episodes, as documented by ED physician; however, all of her is told by the patient's husband that this was 1 single episode."   Her hospital course was noted for acute worsening for which she underwent thrombectomy and was discharged with only some very mild residual aphasia not detectable on examination but noticed by the patient per husband.  He notes that she has been in her normal state of health since discharge other than a visit to the ED on 9/3 with left shoulder pain which was attributed to her known cervical stenosis for which she is planned for surgery in the near future   Husband reports that she was taking his blood pressure when suddenly the blood pressure monitor fell off the table.  She tried to catch it but lost her balance had weakness in both of her legs.  Initially she got to both both arms but then became weak on the right side.  She was unable to speak at all suddenly during this event.  Work-up revealed a left ventricle apical thrombus.   Has been reports she has not missed any of her doses of medication including Eliquis   CTA was attempted to confirm recurrent acute occlusion, however due to IV infiltration and severity of her  symptoms decision was made to proceed directly to IR which was discussed both with Dr. Corliss Johnson as well as with the patient's husband   MR Brain:  IMPRESSION: 1. 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 2. No intracranial large vessel occlusion or significant stenosis. In particular, left MCA branches appear perfused and without  focal stenosis.  MRA:  IMPRESSION: 1. 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 2. No intracranial large vessel occlusion or significant stenosis. In particular, left MCA branches appear perfused and without focal stenosis.  Dr. Bunnie Johnson COURSE Stroke:  left MCA punctate scattered infarct with left M2 occlusion s/p IR with TICI2c, embolic secondary to LV thrombus recently on Eliquis CT no acute abnormality IR left superior M2 occlusion, inferior M3/M4 nonocclusive thrombus Status post EVT with TICI2c MRI left insular cortex and frontal lobe punctate infarcts MRA unremarkable 2D Echo EF 45 to 50% on 8/29, LV regional motion abnormality with LV thrombus LDL 150 on 8/29 HgbA1c 5 0 on 8/29 UDS positive for THC Hypercoagulable and autoimmune work up negative Heparin IV for VTE prophylaxis aspirin 81 mg daily and Eliquis (apixaban) daily prior to admission,  now on coumadin with lovenox bridge. INR goal 2.5 Continue ASA 81 per cardiology. Ongoing aggressive stroke risk factor management Therapy recommendations:  CIR Disposition:  pending   Hx of stroke and LV thrombus 8/29 admitted for nausea vomiting dizziness chest pain and then developed right facial droop and aphasia.  Found to have troponin > 300, EF 45 to 50% with LV thrombus.  CTA coronary artery showed minimal CAD.  CT no acute abnormality.  CTA head and neck showed left M2 occlusion.  CTP 0/7.  Status post EVT with TICI2c.  MRI showed left frontal Bubeck matter punctate infarct.  Repeat MRI showed left external capsule, insular cortex and frontal cortex punctate infarcts.  LDL 150, A1c 5.0.  UDS positive for THC.  Patient was put on heparin IV and aspirin and eventually discharged on Eliquis and aspirin as well as Crestor 20. Per family, patient has slight expressive aphasia since discharge, no physical deficit. Cardiology Dr. Eden Johnson on board, recommend coumadin with INR goal 2.5. on  lovenox bridge.    Ms. Chloe Johnson was admitted to inpatient rehabilitation on 02/04/2022 and discharged home on 02/07/2022. She will begin outpatient Therapy on 02/25/2022 at [facility].  Ms. Stephani has expressive aphasia.  She was positive with THC, she reports she uses CBD products for anxiety, this was discussed with [name]. [name] stated she can continue with her CBD product, she verbalizes understanding.   1) Right shoulder pain  -started suddenly when she was doing an activity (history is limited by her aphasia) -she feels this pain is more than just spasticity -it is very severe -she has been doing better with the increase in Lyrica to 200mg  BID -XR discussed and she would like to get this -she prefers nonsurgical treatment if possible.  -she discussed with Dr. Everardo Pacific and surgery was not recommended due to risk of re-tear -she is scheduled for steroid injection -she would be interested in starting topamax which we discussed before -pain has returned but she sees Dr. Everardo Pacific next week and she is going to ask for another injection -she thinks her shoulder is healing -positioning in car was causing the 10/10 of pain  2) Aphasia is greatly improving and she continues working with therapy -she is speaking so well today -not  frustrated with speech but it is seems like it it shouldn't be this hard -she is still struggling -notes word finding difficuties -sometimes she feels frustrated as she can't find the thought she wants.  -she is going to be discharged from therapy on may 8th -she feels that she continues to improve every day -she slows down when she sees confusion on someone's face -does not feel she can return to work yet   3) UTI: -pyridium helped with her UTI symptoms  Pain Inventory Average Pain 4 Pain Right Now 4 My pain is constant and aching  LOCATION OF PAIN  Neck,Wrist,Hand,Back  BOWEL Number of stools per week: 14  Type of  laxative Super Cleanse  BLADDER Normal    Mobility walk without assistance how many minutes can you walk? As needed ability to climb steps?  yes do you drive?  no Do you have any goals in this area?  yes  Function employed # of hrs/week 70  Neuro/Psych bowel control problems weakness numbness anxiety  Prior Studies Any changes since last visit?  no  Physicians involved in your care Any changes since last visit?  no   Family History  Problem Relation Age of Onset   Other Mother    Other Father    Hypertension Father    Cancer Sister    Sleep apnea Sister    Social History   Socioeconomic History   Marital status: Married    Spouse name: Leonette Most   Number of children: 3   Years of education: RN   Highest education level: Not on file  Occupational History    Employer: [employer]   Tobacco Use   Smoking status: Never    Passive exposure: Past   Smokeless tobacco: Never  Vaping Use   Vaping Use: Never used  Substance and Sexual Activity   Alcohol use: Yes    Alcohol/week: 2.0 standard drinks of alcohol    Types: 2 Shots of liquor per week    Comment: "a little"; 1/2 a shot x 3 per week   Drug use: No    Comment: takes CBD gummies for sleep and anxiety   Sexual activity: Yes    Birth control/protection: Post-menopausal  Other Topics Concern   Not on file  Social History Narrative   Patient is married and lives at home with her husband Leonette Most).  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 Right handed.College education.         Social Determinants of Health   Financial Resource Strain: Not on file  Food Insecurity: Not on file  Transportation Needs: Not on file  Physical Activity: Not on file  Stress: Not on file  Social Connections: Not on file   Past Surgical History:  Procedure Laterality Date   BREAST CYST EXCISION Left 2010    "polypectomy"   IR CT HEAD LTD  01/21/2022   IR CT HEAD LTD  01/28/2022   IR PERCUTANEOUS ART THROMBECTOMY/INFUSION INTRACRANIAL INC DIAG ANGIO  01/21/2022   IR PERCUTANEOUS ART THROMBECTOMY/INFUSION INTRACRANIAL INC DIAG ANGIO  01/28/2022   IR US GUIDE VASC ACCESS RIGHT  01/22/2022   PICC LINE PLACE PERIPHERAL ([facility] HX) Right    for use of Levaquin & Vancomycin, of note: she reports that she had a "flulike feeling"    RADIOLOGY WITH ANESTHESIA N/A 01/21/2022   Procedure: IR WITH ANESTHESIA;  Surgeon: Julieanne Cotton,  MD;  Location: [facility] OR;  Service: Radiology;  Laterality: N/A;   RADIOLOGY WITH ANESTHESIA N/A 01/28/2022   Procedure: IR WITH ANESTHESIA;  Surgeon: Radiologist, Medication, MD;  Location: [facility] OR;  Service: Radiology;  Laterality: N/A;   TONSILLECTOMY AND ADENOIDECTOMY  1975   TUBAL LIGATION  1999   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/16/2013   L3-4/notes 11/24/2013, arms, neck   Exertional asthma    GERD (gastroesophageal reflux disease)    Heart attack 01/18/2022   Hypertension    Kidney stones    "have always passed them"   Neck pain    Osteomyelitis 11/23/2013   osteomyelitis, discitis    Sleep concern    uses Prozac for sleep    Stroke 01/21/2022   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; left with aphasia, dysarthria, and apraxia   There were no vitals taken for this visit.  Opioid Risk Score:   Fall Risk Score:  `1  Depression screen [facility] 2/9     07/11/2022   10:19 AM 02/20/2022    9:22 AM 02/15/2014   11:06 AM 01/18/2014    2:15 PM 12/23/2013   10:14 AM  Depression screen PHQ 2/9  Decreased Interest 0 0 0 0 0  Down, Depressed, Hopeless 0 0 1 0 0  PHQ - 2 Score 0 0 1 0 0  Altered sleeping  0     Tired, decreased energy  1     Change in appetite  0     Feeling bad or failure about yourself   0     Trouble concentrating  0     Moving slowly or fidgety/restless  0     Suicidal thoughts  0     PHQ-9 Score  1      Difficult doing work/chores  Not difficult at all         Review of Systems  Musculoskeletal:  Positive for back pain and neck pain.       Wrist and hand pain   All other systems reviewed and are negative.      Objective:  Gen: no distress, normal appearing HEENT: oral mucosa pink and moist, NCAT Cardio: Reg rate Chest: normal effort, normal rate of breathing Abd: soft, non-distended Ext: no edema Psych: pleasant, normal affect Skin: intact Neuro: Alert and oriented x3 MSK: mild spasticity in right hand fingers. Sensation intact. Thumb flexion 2/5. Improving speech rate ant intelligibility.        Assessment & Plan:  Left Middle Cerebral Artery Stroke: Continue current medication regimen. Has a scheduled appointment with Neurology. She will begin outpatient therapy at [facility]. Continue to Monitor. Continue speech therapy to continue working on aphasia Discussed that she went to the stroke recovery meeting Referred to OT for vivstem referral Left Ventricular Thrombus: Continue current medication regimen. Cardiology Following.  Essential Hypertension. Continue current medication regimen. PCP Following. Continue to Monitor.  Right shoulder pain: Discussed that MRI shows complete tear, referred to ortho Discussed extracorporeal shockwave therapy as a modality for treatment. Discussed that the device looks and feels like a massage gun and I would move it over the area of pain for about 10 minutes. The device releases sound waves to the area of pain and helps to improve blood flow and circulation to improve the healing process. Discuss that this initially induces inflammation and can sometimes cause short-term increase in pain. Discussed that  we typically do three weekly treatments, but sometimes up to 6 if needed, and after 6 weeks long term benefits can sometimes be achieved.  Lyrica increased to  BID, discussed that she can continue this dose if she wants  but given her unwanted weight gain we can alternatively replace her morning dose with low dose naltrexone. She preferred to try this latter option. -discussed mechanism of action of low dose naltrexone as an opioid receptor antagonist which stimulates your body's production of its own natural endogenous opioids, helping to decrease pain. Discussed that it can also decrease T cell response and thus be helpful in decreasing inflammation, and symptoms of brain fog, fatigue, anxiety, depression, and allergies. Discussed that this medication needs to be compounded at a compounding pharmacy and can more expensive. Discussed that I usually start at  and if this is not providing enough relief then I titrate upward on a monthly basis.   Prescribed topamax to help improve sleep and pain, discussed that it can also curb appetite Encouraged follow-up with Dr. Everardo Pacific next week  5. Overweight: -continue topamax  HS -decrease Lyrica  daily  -Educated that current weight is 150 lbs, BMI 29.29 -Educated regarding health benefits of weight loss- for pain, general health, chronic disease prevention, immune health, mental health.  -Will monitor weight every visit.  -Consider Roobois tea daily.  -Discussed the benefits of intermittent fasting. -Discussed foods that can assist in weight loss: 1) leafy greens- high in fiber and nutrients 2) dark chocolate- improves metabolism (if prefer sweetened, best to sweeten with honey instead of sugar).  3) cruciferous vegetables- high in fiber and protein 4) full fat yogurt: high in healthy fat, protein, calcium, and probiotics 5) apples- high in a variety of phytochemicals 6) nuts- high in fiber and protein that increase feelings of fullness 7) grapefruit: rich in nutrients, antioxidants, and fiber (not to be taken with anticoagulation) 8) beans- high in protein and fiber 9) salmon- has high quality protein and healthy fats 10) green tea- rich in polyphenols 11)  eggs- rich in choline and vitamin D 12) tuna- high protein, boosts metabolism 13) avocado- decreases visceral abdominal fat 14) chicken (pasture raised): high in protein and iron 15) blueberries- reduce abdominal fat and cholesterol 16) whole grains- decreases calories retained during digestion, speeds metabolism 17) chia seeds- curb appetite 18) chilies- increases fat metabolism  -Discussed supplements that can be used:  1) Metatrim  BID 30 minutes before breakfast and dinner  2) Sphaeranthus indicus and Garcinia mangostana (combinations of these and #1 can be found in capsicum and zychrome  3) green coffee bean extract  twice per day or Irvingia (african mango) 150 to  twice per day.   5. Right hand weakness:  -continue OT -discussed vivistim -discussed e-stim and that she bought her own tens unit  6) Aphasia: -discussed her progress with SLP -discussed that this is still a limitation in her returning to work  7) UTI -prescribed macrobin  BID for 7 days -discussed UA/UC results -recommended staying well hydrated -recommended Uqora for improving vaginal microbiome  5 minutes spent in discussion of her UA/UC results, discussed Uqora for good vaginal health, staying well hydrated, sending macrobid for her

## 2022-09-13 ENCOUNTER — Other Ambulatory Visit: Payer: Self-pay | Admitting: Physical Medicine and Rehabilitation

## 2022-09-13 LAB — URINE CULTURE

## 2022-09-15 ENCOUNTER — Other Ambulatory Visit: Payer: Self-pay | Admitting: Physical Medicine and Rehabilitation

## 2022-09-15 ENCOUNTER — Telehealth: Payer: Self-pay | Admitting: *Deleted

## 2022-09-15 MED ORDER — PREGABALIN 150 MG PO CAPS: 150.0000 mg | ORAL_CAPSULE | Freq: Every evening | ORAL | 3 refills | Status: DC

## 2022-09-15 NOTE — Telephone Encounter (Signed)
There is an issue with the Lyrical 150 rx. Please change directions to 1 capsule qhs. The original script was bid. The pharmacy will not fill.

## 2022-09-15 NOTE — Telephone Encounter (Signed)
Your prior authorization for Wegovy has been denied.    When applicable, information about how to complete an appeal for this patient will be sent to you. Please also see the determination letter provided by the payer/PBM for more information.    Message from plan: Your PA request has been denied. Additional information will be provided in the denial communication. (Message 1140)

## 2022-09-16 DIAGNOSIS — M6281 Muscle weakness (generalized): Secondary | ICD-10-CM | POA: Diagnosis not present

## 2022-09-16 DIAGNOSIS — N3946 Mixed incontinence: Secondary | ICD-10-CM | POA: Diagnosis not present

## 2022-09-16 DIAGNOSIS — M6289 Other specified disorders of muscle: Secondary | ICD-10-CM | POA: Diagnosis not present

## 2022-09-17 ENCOUNTER — Ambulatory Visit: Payer: Federal, State, Local not specified - PPO | Admitting: Occupational Therapy

## 2022-09-17 ENCOUNTER — Ambulatory Visit: Payer: Federal, State, Local not specified - PPO

## 2022-09-17 DIAGNOSIS — R208 Other disturbances of skin sensation: Secondary | ICD-10-CM

## 2022-09-17 DIAGNOSIS — R41841 Cognitive communication deficit: Secondary | ICD-10-CM | POA: Diagnosis not present

## 2022-09-17 DIAGNOSIS — M6281 Muscle weakness (generalized): Secondary | ICD-10-CM

## 2022-09-17 DIAGNOSIS — R278 Other lack of coordination: Secondary | ICD-10-CM

## 2022-09-17 DIAGNOSIS — R471 Dysarthria and anarthria: Secondary | ICD-10-CM

## 2022-09-17 DIAGNOSIS — I69351 Hemiplegia and hemiparesis following cerebral infarction affecting right dominant side: Secondary | ICD-10-CM

## 2022-09-17 DIAGNOSIS — R4701 Aphasia: Secondary | ICD-10-CM

## 2022-09-17 DIAGNOSIS — R482 Apraxia: Secondary | ICD-10-CM
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
## 2022-09-17 NOTE — Therapy (Signed)
OUTPATIENT OCCUPATIONAL THERAPY  Treatment Note   Patient Name: Chloe Johnson MRN: [id number] DOB:11-09-1955, 67 y.o., female Today's Date: 09/17/2022  PCP: Thana Ates, MD REFERRING PROVIDER: Horton Chin, MD        OT End of Session - 09/17/22 1321     Visit Number 41    Number of Visits 57    Date for OT Re-Evaluation 11/07/22    Authorization Type Medicare A&B    OT Start Time 1320    OT Stop Time 1400    OT Time Calculation (min) 40 min    Activity Tolerance Patient tolerated treatment well    Behavior During Therapy WFL for tasks assessed/performed                                          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/16/2013   L3-4/notes 11/24/2013, arms, neck   Exertional asthma    GERD (gastroesophageal reflux disease)    Heart attack 01/18/2022   Hypertension    Kidney stones    "have always passed them"   Neck pain    Osteomyelitis 11/23/2013   osteomyelitis, discitis    Sleep concern    uses Prozac for sleep    Stroke 01/21/2022   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; left with aphasia, dysarthria, and apraxia   Past Surgical History:  Procedure Laterality Date   BREAST CYST EXCISION Left 2010   "polypectomy"   IR CT HEAD LTD  01/21/2022   IR CT HEAD LTD  01/28/2022   IR PERCUTANEOUS ART THROMBECTOMY/INFUSION INTRACRANIAL INC DIAG ANGIO  01/21/2022   IR PERCUTANEOUS ART THROMBECTOMY/INFUSION INTRACRANIAL INC DIAG ANGIO  01/28/2022   IR US GUIDE VASC ACCESS RIGHT  01/22/2022   PICC LINE PLACE PERIPHERAL ([facility] HX) Right    for use of Levaquin & Vancomycin, of note: she reports that she had a "flulike feeling"    RADIOLOGY WITH ANESTHESIA N/A 01/21/2022   Procedure: IR WITH ANESTHESIA;  Surgeon: Julieanne Cotton, MD;  Location: [facility] OR;  Service: Radiology;  Laterality: N/A;   RADIOLOGY WITH ANESTHESIA N/A 01/28/2022   Procedure: IR WITH ANESTHESIA;  Surgeon:  Radiologist, Medication, MD;  Location: [facility] OR;  Service: Radiology;  Laterality: N/A;   TONSILLECTOMY AND ADENOIDECTOMY  1975   TUBAL LIGATION  1999   Patient Active Problem List   Diagnosis Date Noted   Aphasia 03/31/2022   Snores 03/31/2022   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   UTI (urinary tract infection) 02/04/2022   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   Stroke determined by clinical assessment 01/28/2022   Middle cerebral artery embolism, left 01/28/2022   H/O ischemic left MCA stroke    Acute respiratory failure    CVA (cerebral vascular accident) 01/21/2022   NSTEMI (non-ST elevated myocardial infarction) 01/21/2022   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   HFrEF (heart failure with reduced ejection fraction) 01/21/2022   Hyperlipidemia 01/21/2022   Lumbar stenosis 01/21/2022   Stroke (cerebrum) 01/21/2022   S/P lumbar spinal fusion 11/15/2014   Lumbar discitis 11/29/2013   Discitis 11/24/2013   Diarrhea 11/17/2013   Osteomyelitis of lumbar spine 11/16/2013   Hypertension    Asthma    Anxiety     ONSET DATE: 01/28/22  REFERRING DIAG: I63.9 (ICD-10-CM) - Cerebral infarction,  unspecified   THERAPY DIAG:  Hemiplegia and hemiparesis following cerebral infarction affecting right dominant side  Other lack of coordination  Muscle weakness (generalized)  Other disturbances of skin sensation  Rationale for Evaluation and Treatment Rehabilitation  SUBJECTIVE:   SUBJECTIVE STATEMENT: Pt reports "when this hand looks like this hand I will know it's better." Pt accompanied by: self  PERTINENT HISTORY: PMH: HTN, cervical radiculopathy, hepatitis, osteomyelitis/discitis, lumbar stenosis with foot drop anxiety, GERD, insomnia  PRECAUTIONS: Fall  WEIGHT BEARING RESTRICTIONS  was awaiting spinal fusion surgery, limit bending, twisting, lifting >10#  PAIN:  Are you having pain? Yes: NPRS scale: 4/10 Pain location: R hip Pain description:  aching Aggravating factors: doing anything Relieving factors: nothing  FALLS: Has patient fallen in last 6 months? Yes. Number of falls 2  LIVING ENVIRONMENT: Lives with: lives with their spouse Lives in: House/apartment Stairs: Yes: Internal: full flights of steps; on right going up and External: 7 steps; on left going up Has following equipment at home: Single point cane, Walker - 2 wheeled, Walker - 4 wheeled, shower chair, and bed side commode  PLOF: Independent  PATIENT GOALS I want my arm back.    OBJECTIVE:   HAND DOMINANCE: Right  ADLs: Transfers/ambulation related to ADLs: Mod I with no use of  Eating: Husband assists with cutting, feeding self with L hand Grooming: utilizing L hand with opening and brushing teeth UB Dressing: unable to manage buttons, otherwise Mod I LB Dressing: unable to tie shoes, just slipping on toes Toileting: Mod I with use of L hand Bathing: Mod I, utilizing L hand to shave Tub Shower transfers: Mod I Equipment: Shower seat with back, Walk in shower, and bed side commode   IADLs: Light housekeeping: laundry and hanging up clothes, wipes counters/sink, needs assist to fold clothing Meal Prep: no, did not cook pre-stroke Medication management: husband opens pill bottles Handwriting: unable  MOBILITY STATUS: Needs Assist: Supervision - Mod I without use of AD  POSTURE COMMENTS:  No Significant postural limitations  ACTIVITY TOLERANCE: Activity tolerance: WNL for tasks assessed on eval  FUNCTIONAL OUTCOME MEASURES: FOTO: 49  UPPER EXTREMITY ROM     Active ROM Right eval Left eval  Shoulder flexion WNL WNL  Shoulder abduction    Shoulder adduction    Shoulder extension    Shoulder internal rotation    Shoulder external rotation    Elbow flexion    Elbow extension -5 WNL  Wrist flexion 38 WNL  Wrist extension 50 WNL  Wrist ulnar deviation    Wrist radial deviation    Wrist pronation    Wrist supination    (Blank rows =  not tested)   UPPER EXTREMITY MMT:     MMT Right eval Left eval  Shoulder flexion 4+/5   Shoulder abduction    Shoulder adduction    Shoulder extension    Shoulder internal rotation    Shoulder external rotation    Middle trapezius    Lower trapezius    Elbow flexion 4+/5   Elbow extension 4+/5   Wrist flexion    Wrist extension    Wrist ulnar deviation    Wrist radial deviation    Wrist pronation    Wrist supination    (Blank rows = not tested)  HAND FUNCTION: Loose gross grasp, unable to squeeze dynamometer 06/06/22: R: 15#  COORDINATION: Box and Blocks:  Right 10 blocks, Left 53 blocks Modified performance with RUE with therapist holding blocks in palm and pt  able to complete gross grasp to pick up one at a time from OT palm of hand, minimal hand opening/closing 04/02/22: Right: 16 blocks  SENSATION: Light touch: WFL Stereognosis: Impaired  Hot/Cold: Impaired  Proprioception: Impaired   COGNITION: Overall cognitive status: Within functional limits for tasks assessed  VISION: Subjective report: Pt reports that her R eye does not close as well and that she feels that her L eye is compensating for her R eye. Baseline vision: Wears glasses all the time  VISION ASSESSMENT: To be further assessed in functional context  --------------------------------------------------------------------------------------------------------------------------------------------------------------------- (objective measures above completed at initial evaluation unless otherwise dated)  TODAY'S TREATMENT:  09/17/22 NMES:  Negative electrode is placed between the finger flexors and the wrist flexors. The positive electrode is placed over the tendinous portion of the forearm.  To further facilitate increased flexion in thumb, index, and long finger. Pt with no c/o pain at beginning or end of session.  No adverse reactions after treatment and skin intact.   Ratio 1:2 Rate 35 pps Waveform-  Asymmetric Ramp 1.0 Pulse 250 Intensity- 12 Duration -   8 min at finger flexors Clothespins: engaged in placing 2# clothespins on horizontal dowel with key and then 3 point pinch with focus on strengthening as pt expressing desire to clip finger nails.  Pt with increased difficulty with 3 point pinch when removing from dowel.   UE ROM: reiterated HEP with focus on thumb opposition and thumb ROM as pt with increased limited movement in thumb > fingers impacting tip to tip and 3 point pinch.      09/10/22 Self-feeding: pt scooping 5 beans with spoon with gross grasp in 22.44 seconds.  Pt unable to complete in tripod grasp. OT applied built up/foam handle to spoon, pt then able to complete in 20.18 seconds with use of built up handle and tripod grasp.  Provided pt with additional built up handles for use at home. 9 hole peg test: placed 4 pegs in 2 mins time limit.  Pt sliding pegs up edge of container to pick up, however with decreased ability to rotate therefore picking up at tip of peg and utilizing gravity to rotate peg to then place into hole.  Pt removed 9 pegs in 36.6 seconds with 1 drop, requiring increased time to pick up one dropped peg from table top. Pinch strength: Key pinch: R: 5# and L: 16#;  3 point pinch: R: 2# and L: 13# Kinesiotape: applied kinesiotape to R forearm (anchored at lateral epicondyle and applied to forearm with 20-35% tension past DIP with fist in flexion and slightly deviated radially) and fingers to facilitate finger extension.  Reviewed typical length of wear and purpose of kinesiotape.    08/26/22 Kinesiotape: applied kinesiotape to R forearm (anchored at lateral epicondyle and applied to forearm with 20-35% tension past DIP with fist in flexion and slightly deviated radially) and fingers to facilitate finger extension.  Educated on typical length of wear and purpose of kinesiotape. Box and blocks: focus on picking up and releasing 1" blocks.  Engaged in activity  with focus on finger extension during release.  Completed assessment with LUE: 28 blocks.   9 hole peg test: 5 pegs in in .  Pt sliding pegs up edge of container to increase success with being able to pick up pieces, as unable to do with just pincer grip.  Pt utilizing long finger to rotate peg to allow for good alignment of peg into peg hole.  Pt occasionally "dropping" pegs  into pincer grasp.  Pt continues to demonstrate decreased coordination and pinch strength for manipulation as needed for task.    PATIENT EDUCATION: Education details: ongoing education with focus on coordination and progressive ROM Person educated: Patient Education method: Explanation, Demonstration, and Verbal cues Education comprehension: verbalized understanding and needs further education   HOME EXERCISE PROGRAM: Fine motor coordination handout  Access Code: [email or web address] URL: [email or web address] Date: 08/04/2022 Prepared by: [facility] - Outpatient  Rehab - [facility]  Exercises - Seated Digit Tendon Gliding  - 2 x daily - 1 sets - 10 reps - Thumb AROM MP Blocking  - 2 x daily - 2 sets - 10 reps - Thumb Radial Adduction with Thumb Flexion AROM on Table  - 2 x daily - 2 sets - 10 reps - Thumb Abduction AROM on Table  - 2 x daily - 2 sets - 10 reps - Wrist Prayer Stretch  - 2 x daily - 2 sets - 10 reps - 5-10 sec hold - Wrist Flexion and Extension with Resistance Bar  - 1 x daily - 3 x weekly - 2 sets - 10 reps - Wrist Extension with Resistance  - 1 x daily - 3 x weekly - 2 sets - 5 reps - 3 sec hold - Wrist Flexion with Resistance  - 1 x daily - 3 x weekly - 2 sets - 5 reps - 3 sec hold - Seated Wrist Radial Deviation with Dumbbell  - 1 x daily - 3 x weekly - 2 sets - 10 reps - Seated Wrist Extension with Dumbbell  - 1 x daily - 3 x weekly - 2 sets - 10 reps - Forearm Supination with Dumbbell  - 1 x daily - 3 x weekly - 2 sets - 10 reps      GOALS: Goals reviewed with patient?  Yes   SHORT TERM GOALS: Target date: 10/10/22  1.  Pt will verbalize understanding of alternative treatment methods to aid in motor recovery and decrease of spasticity.  Baseline:  Goal status: In Progress  2.  Pt and caregiver will demonstrate ability to apply kinesiotape to RUE appropriately to facilitate decreased edema and facilitate increased motor recovery.  Baseline:  Goal status: In Progress    LONG TERM GOALS:  11/07/22  1.  Pt will demonstrate use of dominant RUE as gross assist to aid in managing clothing fasteners (tying shoe laces and buttons). Baseline:  Goal status: In Progress  2.  Pt will increase RUE pinch strength by 3# to increase success with holding and manipulating writing and/or eating utensil.  Baseline: key pinch: 5#, 3 point pinch: 2#  Goal status: In Progress  3.  Pt will be able to complete 9 hole peg test with RUE in 2 min time limit to demonstrate increased fine motor control as needed for ADLs/IADLs.  Baseline: 4-5 pegs in 2 min time limit  Goal status: In Progress  4.  Pt will demonstrate increased functional reach, internal rotation, and grasp strength with RUE to tuck in shirt and aid in hygiene post toileting.    Baseline:   Goal status: In Progress   ASSESSMENT:  CLINICAL IMPRESSION: Pt continues to demonstrate decreased tripod grasp due to impaired sensation, pinch strength, and coordination, impacting fine motor control and pinch strength. Pt continues to report desire to focus intensely on resumption of fine motor and strength.  OT reviewed typical recovery pattern and timeline.  Pt does continue to demonstrate improvements in functional grasp and  coordination, however utilizing built up handle and responding to kinesiotape > NMES for management of edema allowing increased mobility.  PERFORMANCE DEFICITS in functional skills including ADLs, IADLs, coordination, dexterity, sensation, ROM, strength, FMC, GMC, balance, body mechanics,  decreased knowledge of use of DME, and UE functional use and psychosocial skills including environmental adaptation, habits, and routines and behaviors.   IMPAIRMENTS are limiting patient from ADLs, IADLs, and work.   COMORBIDITIES may have co-morbidities  that affects occupational performance. Patient will benefit from skilled OT to address above impairments and improve overall function.  MODIFICATION OR ASSISTANCE TO COMPLETE EVALUATION: Min-Moderate modification of tasks or assist with assess necessary to complete an evaluation.  OT OCCUPATIONAL PROFILE AND HISTORY: Detailed assessment: Review of records and additional review of physical, cognitive, psychosocial history related to current functional performance.  CLINICAL DECISION MAKING: Moderate - several treatment options, min-mod task modification necessary  REHAB POTENTIAL: Good  EVALUATION COMPLEXITY: Moderate    PLAN: OT FREQUENCY: 1-2x/week  OT DURATION: 8 weeks  PLANNED INTERVENTIONS: self care/ADL training, therapeutic exercise, therapeutic activity, neuromuscular re-education, manual therapy, passive range of motion, balance training, functional mobility training, splinting, electrical stimulation, ultrasound, compression bandaging, moist heat, cryotherapy, patient/family education, psychosocial skills training, energy conservation, coping strategies training, and DME and/or AE instructions  RECOMMENDED OTHER SERVICES: N/A  CONSULTED AND AGREED WITH PLAN OF CARE: Patient and family member/caregiver  PLAN FOR NEXT SESSION: Coordination, wrist movements, external/internal rotation, functional reach, various pinches, pinch strengthening, e-stim in conjunction with coordination/reach. Self-feeding and/or handwriting.   Jaslin Novitski, OTR/L 09/17/2022, 1:22 PM

## 2022-09-18 ENCOUNTER — Ambulatory Visit: Payer: Federal, State, Local not specified - PPO | Attending: Cardiovascular Disease | Admitting: *Deleted

## 2022-09-18 DIAGNOSIS — I513 Intracardiac thrombosis, not elsewhere classified: Secondary | ICD-10-CM | POA: Diagnosis not present

## 2022-09-18 DIAGNOSIS — Z5181 Encounter for therapeutic drug level monitoring: Secondary | ICD-10-CM | POA: Diagnosis not present

## 2022-09-18 DIAGNOSIS — I639 Cerebral infarction, unspecified: Secondary | ICD-10-CM

## 2022-09-18 LAB — POCT INR: POC INR: 2.6

## 2022-09-18 NOTE — Patient Instructions (Signed)
Description   Continue taking warfarin 1 tablet daily.  Continue green leafy veggie to weekly.  Recheck INR in 5 weeks.   [facility] [phone]

## 2022-09-24 ENCOUNTER — Telehealth: Payer: Self-pay | Admitting: Cardiovascular Disease

## 2022-09-24 NOTE — Therapy (Signed)
OUTPATIENT OCCUPATIONAL THERAPY  Treatment Note   Patient Name: Chloe Johnson MRN: [id number] DOB:23-Jan-1956, 67 y.o., female Today's Date: 10/01/2022  PCP: Thana Ates, MD REFERRING PROVIDER: Horton Chin, MD    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/16/2013   L3-4/notes 11/24/2013, arms, neck   Exertional asthma    GERD (gastroesophageal reflux disease)    Heart attack (HCC) 01/18/2022   Hypertension    Kidney stones    "have always passed them"   Neck pain    Osteomyelitis (HCC) 11/23/2013   osteomyelitis, discitis    Sleep concern    uses Prozac for sleep    Stroke (HCC) 01/21/2022   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; left with aphasia, dysarthria, and apraxia   Past Surgical History:  Procedure Laterality Date   BREAST CYST EXCISION Left 2010   "polypectomy"   IR CT HEAD LTD  01/21/2022   IR CT HEAD LTD  01/28/2022   IR PERCUTANEOUS ART THROMBECTOMY/INFUSION INTRACRANIAL INC DIAG ANGIO  01/21/2022   IR PERCUTANEOUS ART THROMBECTOMY/INFUSION INTRACRANIAL INC DIAG ANGIO  01/28/2022   IR US GUIDE VASC ACCESS RIGHT  01/22/2022   PICC LINE PLACE PERIPHERAL ([facility] HX) Right    for use of Levaquin & Vancomycin, of note: she reports that she had a "flulike feeling"    RADIOLOGY WITH ANESTHESIA N/A 01/21/2022   Procedure: IR WITH ANESTHESIA;  Surgeon: Julieanne Cotton, MD;  Location: [facility] OR;  Service: Radiology;  Laterality: N/A;   RADIOLOGY WITH ANESTHESIA N/A 01/28/2022   Procedure: IR WITH ANESTHESIA;  Surgeon: Radiologist, Medication, MD;  Location: [facility] OR;  Service: Radiology;  Laterality: N/A;   TONSILLECTOMY AND ADENOIDECTOMY  1975   TUBAL LIGATION  1999   Patient Active Problem List   Diagnosis Date Noted   Aphasia 03/31/2022   Snores 03/31/2022   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   UTI (urinary tract infection) 02/04/2022   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   Stroke determined by clinical  assessment (HCC) 01/28/2022   Middle cerebral artery embolism, left 01/28/2022   H/O ischemic left MCA stroke    Acute respiratory failure (HCC)    CVA (cerebral vascular accident) (HCC) 01/21/2022   NSTEMI (non-ST elevated myocardial infarction) (HCC) 01/21/2022   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   HFrEF (heart failure with reduced ejection fraction) (HCC) 01/21/2022   Hyperlipidemia 01/21/2022   Lumbar stenosis 01/21/2022   Stroke (cerebrum) (HCC) 01/21/2022   S/P lumbar spinal fusion 11/15/2014   Lumbar discitis 11/29/2013   Discitis 11/24/2013   Diarrhea 11/17/2013   Osteomyelitis of lumbar spine (HCC) 11/16/2013   Hypertension    Asthma    Anxiety     ONSET DATE: 01/28/22  REFERRING DIAG: I63.9 (ICD-10-CM) - Cerebral infarction, unspecified   THERAPY DIAG:  Hemiplegia and hemiparesis following cerebral infarction affecting right dominant side (HCC)  Muscle weakness (generalized)  Other lack of coordination  Other disturbances of skin sensation  Rationale for Evaluation and Treatment Rehabilitation  PERTINENT HISTORY: PMH: HTN, cervical radiculopathy, hepatitis, osteomyelitis/discitis, lumbar stenosis with foot drop anxiety, GERD, insomnia  PRECAUTIONS: Fall;  WEIGHT BEARING RESTRICTIONS was awaiting spinal fusion surgery, limit bending, twisting, lifting >10#   SUBJECTIVE:   SUBJECTIVE STATEMENT: Pt states has a bit of pain as her activity levels are up and they are decreasing her lyrica.   PAIN:  Are you having pain?  Yes: NPRS scale: 3/10  Pain location: R hip Pain description: aching Aggravating factors: doing anything Relieving factors: nothing  FALLS: Has patient fallen in last 6 months? Yes. Number of falls 2  LIVING ENVIRONMENT: Lives with: lives with their spouse Lives in: House/apartment Stairs: Yes: Internal: full flights of steps; on right going up and External: 7 steps; on left going up Has following equipment at home: Single  point cane, Walker - 2 wheeled, Walker - 4 wheeled, shower chair, and bed side commode  PLOF: Independent  PATIENT GOALS I want my arm back.     OBJECTIVE: (All objective assessments below are from initial evaluation on: 02/25/22 unless otherwise specified.)   HAND DOMINANCE: Right  ADLs: Transfers/ambulation related to ADLs: Mod I with no use of  Eating: Husband assists with cutting, feeding self with L hand Grooming: utilizing L hand with opening and brushing teeth UB Dressing: unable to manage buttons, otherwise Mod I LB Dressing: unable to tie shoes, just slipping on toes Toileting: Mod I with use of L hand Bathing: Mod I, utilizing L hand to shave Tub Shower transfers: Mod I Equipment: Shower seat with back, Walk in shower, and bed side commode   IADLs: Light housekeeping: laundry and hanging up clothes, wipes counters/sink, needs assist to fold clothing Meal Prep: no, did not cook pre-stroke Medication management: husband opens pill bottles Handwriting: unable  MOBILITY STATUS: Needs Assist: Supervision - Mod I without use of AD  POSTURE COMMENTS:  No Significant postural limitations  ACTIVITY TOLERANCE: Activity tolerance: WNL for tasks assessed on eval  FUNCTIONAL OUTCOME MEASURES: FOTO: 49  UPPER EXTREMITY ROM     Active ROM Right eval Left eval Rt 10/01/22  Shoulder flexion WNL WNL   Shoulder abduction     Shoulder adduction     Shoulder extension     Shoulder internal rotation   Approx 35  Shoulder external rotation   Approx 75*  Elbow flexion     Elbow extension -5 WNL   Wrist flexion 38 WNL Aprox 70  Wrist extension 50 WNL Approx 40  Wrist ulnar deviation     Wrist radial deviation     Wrist pronation     Wrist supination     (Blank rows = not tested)   UPPER EXTREMITY MMT:     MMT Right eval  Shoulder flexion 4+/5  Shoulder abduction   Shoulder adduction   Shoulder extension   Shoulder internal rotation   Shoulder external rotation    Middle trapezius   Lower trapezius   Elbow flexion 4+/5  Elbow extension 4+/5  Wrist flexion   Wrist extension   Wrist ulnar deviation   Wrist radial deviation   Wrist pronation   Wrist supination   (Blank rows = not tested)  HAND FUNCTION: Loose gross grasp, unable to squeeze dynamometer 06/06/22: R: 15#  COORDINATION: Box and Blocks:  Right 10 blocks, Left 53 blocks Modified performance with RUE with therapist holding blocks in palm and pt able to complete gross grasp to pick up one at a time from OT palm of hand, minimal hand opening/closing 04/02/22: Right: 16 blocks  SENSATION: 10/01/22: In right hand light touch is decreased especially in median nerve distribution and mildly in ulnar nerve distribution (this is also apparent with muscle testing of the median and ulnar innervated muscles).  Radial nerve is not significantly affected today.  VISION: Subjective report: Pt reports that her R eye does not close as well and that she feels that her L eye is compensating  for her R eye. Baseline vision: Wears glasses all the time ---------------------------------------------------------------------------------------------------------------------------------------------------------------------  TODAY'S TREATMENT:  10/01/22: Due to main complaints of paresthesia in the median nerve distribution, decreased hand use and coordination, stiffness in the right shoulder-OT provides an updated home exercise program that includes neuromuscular re-education for nerve gliding, fine motor skills coordination with in hand manipulation skills, stretches for tight shoulder and tight wrist, and therapy putty activities for targeting ulnar median nerve muscle wasting.  Unfortunately not all were able to be: Through the session and so this will be continued on in the next session as possible.  OT also notes that she has bicep spasm and would likely benefit from dry needling this could be contributory towards  median nerve dysfunction as well. She is in agreement to try dry needling next session, and states that she will work on new HEP until next week.   Exercises - Median Nerve Flossing  - 2-3 x daily - 5-10 reps - Ulnar Nerve Flossing  - 3-4 x daily - 1-2 sets - 5-10 reps - Sleeper Stretch  - 3-4 x daily - 3-5 reps - 15 hold - Standing neck/upper traps stretch  - 4-6 x daily - 3-5 reps - 15 sec hold - Doorway Stretches (both arms low, lean in gently)   - 3-4 x daily - 3-5 reps - 15 hold - Fridge Door Stretch  - 4 x daily - 3-5 reps - 15 sec hold - Wrist Prayer Stretch  - 4 x daily - 3-5 reps - 15 sec hold - Finger Spreading  - 4-6 x daily - 10-15 reps - Thumb Opposition  - 4-6 x daily - 10 reps - Thumb stretch  - 4 x daily - 3-5 reps - 15-20 sec hold - Tendon Glides  - 4-6 x daily - 3-5 reps - 2-3 seconds hold - Full Fist  - 2-3 x daily - 5 reps - "Duck Mouth" Strength  - 2-3 x daily - 5 reps - Finger Extension "Pizza!"   - 2-3 x daily - 5 reps - Thumb Opposition with Putty  - 2-3 x daily - 5 reps - Cutting Putty  - 2-3 x daily - 5 reps - Finger Spread  - 2-3 x daily - 5 reps  In-Hand Manipulation Skills Rotation:  Hold pen, try to "twirl" like a baton, keeping flat with surface of table. Try going BOTH directions 10x  Flip:  Hold pen in writing position, flip in an arch to "erase" position, then back to "write" position. Do not lift hand off table.  10x  Translation:  Open hand palm up,  put an object in your palm and then use your fingers and thumb to move it to the tips of your fingers, pinched against your thumb.  10x (bigger is easier (fat marker), smaller is harder (penny)) Shift:  Hold pen like a dart, start "shifting" it forward until you are holding it at the base, then shift it backwards until you are holding it at the tip again. 10x (like putting a key in a key hole)    PATIENT EDUCATION: Education details: ongoing education with focus on coordination and progressive  ROM Person educated: Patient Education method: Explanation, Demonstration, and Verbal cues Education comprehension: verbalized understanding and needs further education   HOME EXERCISE PROGRAM: Access Code: [email or web address] URL: [email or web address] Date: 10/01/2022 Prepared by: Fannie Knee  Access Code: [email or web address] URL: [email or web address] Date: 08/04/2022 Prepared by: [facility] - [facility]   GOALS:  Goals reviewed with patient? Yes   SHORT TERM GOALS: Target date: 10/10/22  1.  Pt will verbalize understanding of alternative treatment methods to aid in motor recovery and decrease of spasticity.  Baseline:  Goal status: In Progress  2.  Pt and caregiver will demonstrate ability to apply kinesiotape to RUE appropriately to facilitate decreased edema and facilitate increased motor recovery.  Baseline:  Goal status: In Progress    LONG TERM GOALS:  11/07/22  1.  Pt will demonstrate use of dominant RUE as gross assist to aid in managing clothing fasteners (tying shoe laces and buttons). Baseline:  Goal status: In Progress  2.  Pt will increase RUE pinch strength by 3# to increase success with holding and manipulating writing and/or eating utensil.  Baseline: key pinch: 5#, 3 point pinch: 2#  Goal status: In Progress  3.  Pt will be able to complete 9 hole peg test with RUE in 2 min time limit to demonstrate increased fine motor control as needed for ADLs/IADLs.  Baseline: 4-5 pegs in 2 min time limit  Goal status: In Progress  4.  Pt will demonstrate increased functional reach, internal rotation, and grasp strength with RUE to tuck in shirt and aid in hygiene post toileting.    Baseline:   Goal status: In Progress   ASSESSMENT:  CLINICAL IMPRESSION: 10/01/22: She has been working with therapy periodically now for many months, and this is not entirely typical.  She was educated that as soon as she receives maximum  benefit from therapy services, she should be able to transition to self-care and management at home and discharge therapy (especially if she had not been making any significant gains recently).  Fortunately today OT is able to add new ideas to help with her paresthesia and nerve issues and their effectiveness will be determined in the next 1 to 2 weeks.  She is hesitant to want to discharge therapy anytime soon but was reassured that she can always return at a different point in time if self-management does not work well or she has a significant change in status.    PLAN: OT FREQUENCY: 1-2x/week  OT DURATION: 8 weeks  PLANNED INTERVENTIONS: self care/ADL training, therapeutic exercise, therapeutic activity, neuromuscular re-education, manual therapy, passive range of motion, balance training, functional mobility training, splinting, electrical stimulation, ultrasound, compression bandaging, moist heat, cryotherapy, patient/family education, psychosocial skills training, energy conservation, coping strategies training, and DME and/or AE instructions  CONSULTED AND AGREED WITH PLAN OF CARE: Patient and family member/caregiver  PLAN FOR NEXT SESSION:  Try dry needling to tight spasming bicep on the right arm, try to determine the effect of internal rotation stretches on tight internal rotation shoulder as well as median nerve glides for typically numb median nerve.  Finish the rest of her HEP with her   Fannie Knee, OTR/L 10/01/2022, 1:22 PM

## 2022-09-24 NOTE — Telephone Encounter (Signed)
Awaiting paperwork/forms from the [facility]. The letter requesting medical records is in the FMLA/DISABILITY basket at front desk.

## 2022-09-25 NOTE — Telephone Encounter (Signed)
Received medical record request from the [facility]. They did not send a form to be completed, only a records request and recommendations of return to work restrictions.  Paperwork in Dr. Harvie Bridge box.

## 2022-09-30 NOTE — Progress Notes (Unsigned)
Cardiology Office Note:    Date:  10/02/2022   ID:  Chloe Johnson, DOB 11/30/1955, MRN [id number]  PCP:  Thana Ates, MD   [facility] Providers Cardiologist: Xianna Siverling    Referring MD: Thana Ates, MD   Chief Complaint  Patient presents with   Cerebrovascular Accident   LV thrombus     History of Present Illness:    Chloe Johnson is a 67 y.o. female with a hx of  ? 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  She was thought to have had an eliquis failure and  She has now been transitioned to coumadin /Lovenox. I actually think the issues was that she just had not been on any anticoagulant long enough to start dissolving the thrombus   Has an expressive aphasia  Improving with rehab. Improving daily   INR followed at [facility]  Last INR is 1.3   Oct. 16, 2023 Chloe Johnson is seen for follow up of her CHF, LV thrombus and stroke Echo several days ago shows normal LV function without mention of segmental wall motion abn. Mild MR, trivial TR  Mild PI   Her last INR is 4.3 on Oct. 12, 2023  Managed by our coumadin clinic   Oct 01, 2022 Chloe Johnson is seen for follow up of her CHF, LV thrombus, and stroke  She wants to stop warfarin and start Eliquis  She has seen Dr. Myna Hidalgo and see if Eliquis will be adequate for her factor II abnormalty   She thinks her entresto is causing her hypokalemia and she is not able too tolerate the Kdur ( stomach issues Wants to try valsartan instead   Also would like to stop her toprol XL ( since her LV function has improved ) I would like to continue the Toprol XL for now     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/16/2013   L3-4/notes 11/24/2013, arms, neck   Exertional asthma    GERD (gastroesophageal reflux disease)     Heart attack (HCC) 01/18/2022   Hypertension    Kidney stones    "have always passed them"   Neck pain    Osteomyelitis (HCC) 11/23/2013   osteomyelitis, discitis    Sleep concern    uses Prozac for sleep    Stroke (HCC) 01/21/2022   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; left with aphasia, dysarthria, and apraxia    Past Surgical History:  Procedure Laterality Date   BREAST CYST EXCISION Left 2010   "polypectomy"   IR CT HEAD LTD  01/21/2022   IR CT HEAD LTD  01/28/2022   IR PERCUTANEOUS ART THROMBECTOMY/INFUSION INTRACRANIAL INC DIAG ANGIO  01/21/2022   IR PERCUTANEOUS ART THROMBECTOMY/INFUSION INTRACRANIAL INC DIAG ANGIO  01/28/2022   IR US GUIDE VASC ACCESS RIGHT  01/22/2022   PICC LINE PLACE PERIPHERAL ([facility] HX) Right    for use of Levaquin & Vancomycin, of note: she reports that she had a "flulike feeling"    RADIOLOGY WITH ANESTHESIA N/A 01/21/2022   Procedure: IR WITH ANESTHESIA;  Surgeon: Julieanne Cotton, MD;  Location: [facility] OR;  Service: Radiology;  Laterality: N/A;   RADIOLOGY WITH ANESTHESIA N/A 01/28/2022   Procedure: IR WITH ANESTHESIA;  Surgeon: Radiologist, Medication, MD;  Location: [facility] OR;  Service: Radiology;  Laterality: N/A;   TONSILLECTOMY AND ADENOIDECTOMY  1975   TUBAL LIGATION  1999    Current Medications: Current Meds  Medication Sig   acetaminophen (TYLENOL) 325 MG tablet Take 2 tablets (650 mg total) by mouth every 4 (four) hours as needed for mild pain (or temp > 37.5 C (99.5 F)). (Patient taking differently: Take 650 mg by mouth 2 (two) times daily.)   cyclobenzaprine (FLEXERIL) 10 MG tablet TAKE ONE TABLET BY MOUTH AT BEDTIME   famotidine (PEPCID) 20 MG tablet Take 1 tablet (20 mg total) by mouth 2 (two) times daily.   IMVEXXY MAINTENANCE PACK 10 MCG INST Place vaginally 2 (two) times a week.   loratadine (CLARITIN) 10 MG tablet Take 1 tablet (10 mg total) by mouth daily.   metoprolol succinate (TOPROL-XL) 25 MG 24 hr tablet Take 0.5 tablets (12.5 mg total) by  mouth daily.   mirabegron ER (MYRBETRIQ) 50 MG TB24 tablet Take 25 mg by mouth daily.   naltrexone (DEPADE) 50 MG tablet Take 50 mg by mouth daily.   Naltrexone HCl, Pain, 1.5 MG CAPS Take 1 mg by mouth daily at 6 (six) AM.   potassium chloride (KLOR-CON) 10 MEQ tablet Take 1 tablet (10 mEq total) by mouth 2 (two) times daily.   pregabalin (LYRICA) 100 MG capsule Take 1 capsule (100 mg total) by mouth daily.   pregabalin (LYRICA) 150 MG capsule Take 1 capsule (150 mg total) by mouth at bedtime.   rosuvastatin (CRESTOR) 20 MG tablet Take 1 tablet (20 mg total) by mouth daily.   Semaglutide-Weight Management 0.25 MG/0.5ML SOAJ Inject 0.25 mg into the skin once a week for 28 days.   spironolactone (ALDACTONE) 25 MG tablet Take 0.5 tablets (12.5 mg total) by mouth daily.   topiramate (TOPAMAX) 50 MG tablet Take 1 tablet (50 mg total) by mouth 2 (two) times daily.   valsartan (DIOVAN) 80 MG tablet Take 1 tablet (80 mg total) by mouth daily.   warfarin (COUMADIN) 4 MG tablet Take 1 tablet (4 mg total) by mouth daily.   [DISCONTINUED] acetaminophen (TYLENOL) 650 MG CR tablet Take 1,300 mg by mouth in the morning and at bedtime. pain   [DISCONTINUED] potassium chloride (KLOR-CON) 10 MEQ tablet Take 2 tablets by mouth twice daily   [DISCONTINUED] potassium chloride (MICRO-K) 10 MEQ CR capsule Take 20 mEq by mouth 2 (two) times daily.   [DISCONTINUED] potassium chloride SA (KLOR-CON M) 20 MEQ tablet Take 1 tablet by mouth 2 (two) times daily.   [DISCONTINUED] sacubitril-valsartan (ENTRESTO) 24-26 MG Take 1 tablet by mouth 2 (two) times daily.     Allergies:   Doxycycline, Erythromycin, Iodine, Vancomycin, Vicodin [hydrocodone-acetaminophen], Dilaudid [hydromorphone hcl], Keflex [cephalexin], Penicillins, and Sulfa antibiotics   Social History   Socioeconomic History   Marital status: Married    Spouse name: Charles   Number of children: 3   Years of education: RN   Highest education level: Not on  file  Occupational History    Employer: [employer]   Tobacco Use   Smoking status: Never    Passive exposure: Past   Smokeless tobacco: Never  Vaping Use   Vaping Use: Never used  Substance and Sexual Activity   Alcohol use: Yes    Alcohol/week: 2.0 standard drinks of alcohol    Types: 2 Shots of liquor per week    Comment: "a little"; 1/2 a shot x 3 per week   Drug use: No    Comment:  takes CBD gummies for sleep and anxiety   Sexual activity: Yes    Birth control/protection: Post-menopausal  Other Topics Concern   Not on file  Social History Narrative   Patient is married and lives at home with her husband Leonette Most).  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 Right handed.College education.         Social Determinants of Health   Financial Resource Strain: Not on file  Food Insecurity: Not on file  Transportation Needs: Not on file  Physical Activity: Not on file  Stress: Not on file  Social Connections: Not on file     Family History: The patient's family history includes Cancer in her sister; Hypertension in her father; Other in her father and mother; Sleep apnea in her sister.  ROS:   Please see the history of present illness.     All other systems reviewed and are negative.  EKGs/Labs/Other Studies Reviewed:    The following studies were reviewed today:   EKG:   Oct 01, 2022:  NSR at 59.  NS T wave abn.   Recent Labs: 01/30/2022: TSH 1.712 02/03/2022: Magnesium 2.1 05/05/2022: ALT 13; BUN 19; Creatinine 0.79; Hemoglobin 13.2; Platelet Count 197; Potassium 3.9; Sodium 140  Recent Lipid Panel    Component Value Date/Time   CHOL 223 (H) 01/22/2022 0351   TRIG 271 (H) 01/29/2022 0300   HDL 40 (L) 01/22/2022 0351   CHOLHDL 5.6 01/22/2022 0351   VLDL 33 01/22/2022 0351   LDLCALC 150 (H) 01/22/2022 0351     Risk Assessment/Calculations:       Physical  Exam:    Physical Exam: Blood pressure 118/60, pulse 61, height 5' (1.524 m), weight 149 lb (67.6 kg), SpO2 97 %.       GEN:  Well nourished, well developed in no acute distress HEENT: Normal NECK: No JVD; No carotid bruits LYMPHATICS: No lymphadenopathy CARDIAC: RRR , no murmurs, rubs, gallops RESPIRATORY:  Clear to auscultation without rales, wheezing or rhonchi  ABDOMEN: Soft, non-tender, non-distended MUSCULOSKELETAL:  No edema; No deformity  SKIN: Warm and dry NEUROLOGIC:   s/p stroke ,      ASSESSMENT:    1. Dyspnea, unspecified type   2. Medication management   3. Takotsubo cardiomyopathy   4. Heart failure with improved ejection fraction (HFimpEF) (HCC)      PLAN:        Chronic systolic CHF :  her LV fnction has normal normalized.   No thrombus on echo  She notes that her K levels have been off since starting entresto and wants to change back to valsartan  Will Dc entresto Start valsartan 80 QD Start spironolactone 12. 5 QD Reduce Kdur to 10  PO BID    2. LV thrombus:     Has a factor II abn.   I have exchanged notes with Dr. Myna Hidalgo who agrees with transitoning to Eliquis in place of Warfarin would be acceptable      3.  Stroke:     slow progress   I am in the process of filling out disability papers She has done well from a cardiac standpoint .  Her LV function is now normal.  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   We will cont to follow her closely but at this point, she does not have any cardiac limitations  Her main limitations that could prevent her from returning to work include her lack of fine motor function of her right hand. I'm not sure that she would be able to type on a keyboard.   I will defer  to neurology and physical therapy regarding her challenges following her stroke .               Medication Adjustments/Labs and Tests Ordered: Current medicines are reviewed at length with the  patient today.  Concerns regarding medicines are outlined above.  Orders Placed This Encounter  Procedures   Basic metabolic panel   EKG 12-Lead   Pulmonary Function Test   Meds ordered this encounter  Medications   valsartan (DIOVAN) 80 MG tablet    Sig: Take 1 tablet (80 mg total) by mouth daily.    Dispense:  90 tablet    Refill:  3   spironolactone (ALDACTONE) 25 MG tablet    Sig: Take 0.5 tablets (12.5 mg total) by mouth daily.    Dispense:  45 tablet    Refill:  3   potassium chloride (KLOR-CON) 10 MEQ tablet    Sig: Take 1 tablet (10 mEq total) by mouth 2 (two) times daily.    Dispense:  180 tablet    Refill:  3    Dose DECREASE    Patient Instructions  Medication Instructions:  STOP ENTRESTO START Spironolactone 12.5mg  daily START Valsartan 80mg  daily DECREASE Potassium Chloride to twice daily *If you need a refill on your cardiac medications before your next appointment, please call your pharmacy*  Lab Work: BMET in 2 weeks If you have labs (blood work) drawn today and your tests are completely normal, you will receive your results only by: MyChart Message (if you have MyChart) OR A paper copy in the mail If you have any lab test that is abnormal or we need to change your treatment, we will call you to review the results.   Testing/Procedures: Pulmonary Function Testing Your physician has recommended that you have a pulmonary function test. Pulmonary Function Tests are a group of tests that measure how well air moves in and out of your lungs.  Follow-Up: At [facility], you and your health needs are our priority.  As part of our continuing mission to provide you with exceptional heart care, we have created designated Provider Care Teams.  These Care Teams include your primary Cardiologist (physician) and Advanced Practice Providers (APPs -  Physician Assistants and Nurse Practitioners) who all work together to provide you with the care you need,  when you need it.  Your next appointment:   2 month(s)  Provider:   Kristeen Miss, MD      Signed, Kristeen Miss, MD  10/02/2022 9:16 AM    [facility]

## 2022-10-01 ENCOUNTER — Encounter: Payer: Self-pay | Admitting: Cardiovascular Disease

## 2022-10-01 ENCOUNTER — Ambulatory Visit: Payer: Federal, State, Local not specified - PPO | Admitting: Rehabilitative and Restorative Service Providers"

## 2022-10-01 ENCOUNTER — Encounter: Payer: Medicare Other | Admitting: Rehabilitative and Restorative Service Providers"

## 2022-10-01 ENCOUNTER — Ambulatory Visit: Payer: Federal, State, Local not specified - PPO | Attending: Cardiovascular Disease | Admitting: Cardiovascular Disease

## 2022-10-01 ENCOUNTER — Ambulatory Visit: Payer: Federal, State, Local not specified - PPO | Attending: Physical Medicine and Rehabilitation

## 2022-10-01 ENCOUNTER — Encounter: Payer: Self-pay | Admitting: Rehabilitative and Restorative Service Providers"

## 2022-10-01 VITALS — BP 118/60 | HR 61 | Ht 60.0 in | Wt 149.0 lb

## 2022-10-01 DIAGNOSIS — R4701 Aphasia: Secondary | ICD-10-CM | POA: Diagnosis not present

## 2022-10-01 DIAGNOSIS — I5181 Takotsubo syndrome: Secondary | ICD-10-CM

## 2022-10-01 DIAGNOSIS — M6281 Muscle weakness (generalized): Secondary | ICD-10-CM | POA: Diagnosis not present

## 2022-10-01 DIAGNOSIS — R278 Other lack of coordination: Secondary | ICD-10-CM | POA: Diagnosis not present

## 2022-10-01 DIAGNOSIS — R41841 Cognitive communication deficit: Secondary | ICD-10-CM

## 2022-10-01 DIAGNOSIS — Z79899 Other long term (current) drug therapy: Secondary | ICD-10-CM

## 2022-10-01 DIAGNOSIS — R0600 Dyspnea, unspecified: Secondary | ICD-10-CM | POA: Diagnosis not present

## 2022-10-01 DIAGNOSIS — I5032 Chronic diastolic (congestive) heart failure: Secondary | ICD-10-CM

## 2022-10-01 DIAGNOSIS — I69351 Hemiplegia and hemiparesis following cerebral infarction affecting right dominant side: Secondary | ICD-10-CM

## 2022-10-01 DIAGNOSIS — R208 Other disturbances of skin sensation: Secondary | ICD-10-CM | POA: Diagnosis not present

## 2022-10-01 DIAGNOSIS — R482 Apraxia: Secondary | ICD-10-CM | POA: Diagnosis not present

## 2022-10-01 DIAGNOSIS — R471 Dysarthria and anarthria: Secondary | ICD-10-CM | POA: Diagnosis not present

## 2022-10-01 MED ORDER — SPIRONOLACTONE 25 MG PO TABS
12.5000 mg | ORAL_TABLET | Freq: Every day | ORAL | 3 refills | Status: DC
Start: 2022-10-01 — End: 2023-09-16

## 2022-10-01 MED ORDER — POTASSIUM CHLORIDE ER 10 MEQ PO TBCR
10.0000 meq | EXTENDED_RELEASE_TABLET | Freq: Two times a day (BID) | ORAL | 3 refills | Status: DC
Start: 2022-10-01 — End: 2023-12-22

## 2022-10-01 MED ORDER — VALSARTAN 80 MG PO TABS
80.0000 mg | ORAL_TABLET | Freq: Every day | ORAL | 3 refills | Status: DC
Start: 2022-10-01 — End: 2023-09-24

## 2022-10-01 NOTE — Therapy (Signed)
OUTPATIENT SPEECH LANGUAGE PATHOLOGY TREATMENT/ Renewal/Discharge summary   Patient Name: Chloe Johnson MRN: [id number] DOB:1955/12/21, 67 y.o., female Today's Date: 10/01/2022  PCP: Thana Ates., MD REFERRING PROVIDER: Horton Chin, MD    End of Session - 10/01/22 1250     Visit Number 42    Number of Visits 42    Date for SLP Re-Evaluation 10/01/22    SLP Start Time 1236    SLP Stop Time  1315    SLP Time Calculation (min) 39 min    Activity Tolerance Patient tolerated treatment well                                   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/16/2013   L3-4/notes 11/24/2013, arms, neck   Exertional asthma    GERD (gastroesophageal reflux disease)    Heart attack (HCC) 01/18/2022   Hypertension    Kidney stones    "have always passed them"   Neck pain    Osteomyelitis (HCC) 11/23/2013   osteomyelitis, discitis    Sleep concern    uses Prozac for sleep    Stroke (HCC) 01/21/2022   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; left with aphasia, dysarthria, and apraxia   Past Surgical History:  Procedure Laterality Date   BREAST CYST EXCISION Left 2010   "polypectomy"   IR CT HEAD LTD  01/21/2022   IR CT HEAD LTD  01/28/2022   IR PERCUTANEOUS ART THROMBECTOMY/INFUSION INTRACRANIAL INC DIAG ANGIO  01/21/2022   IR PERCUTANEOUS ART THROMBECTOMY/INFUSION INTRACRANIAL INC DIAG ANGIO  01/28/2022   IR US GUIDE VASC ACCESS RIGHT  01/22/2022   PICC LINE PLACE PERIPHERAL ([facility] HX) Right    for use of Levaquin & Vancomycin, of note: she reports that she had a "flulike feeling"    RADIOLOGY WITH ANESTHESIA N/A 01/21/2022   Procedure: IR WITH ANESTHESIA;  Surgeon: Julieanne Cotton, MD;  Location: [facility] OR;  Service: Radiology;  Laterality: N/A;   RADIOLOGY WITH ANESTHESIA N/A 01/28/2022   Procedure: IR WITH ANESTHESIA;  Surgeon: Radiologist, Medication, MD;  Location: [facility] OR;  Service: Radiology;   Laterality: N/A;   TONSILLECTOMY AND ADENOIDECTOMY  1975   TUBAL LIGATION  1999   Patient Active Problem List   Diagnosis Date Noted   Aphasia 03/31/2022   Snores 03/31/2022   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   UTI (urinary tract infection) 02/04/2022   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   Stroke determined by clinical assessment (HCC) 01/28/2022   Middle cerebral artery embolism, left 01/28/2022   H/O ischemic left MCA stroke    Acute respiratory failure (HCC)    CVA (cerebral vascular accident) (HCC) 01/21/2022   NSTEMI (non-ST elevated myocardial infarction) (HCC) 01/21/2022   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   HFrEF (heart failure with reduced ejection fraction) (HCC) 01/21/2022   Hyperlipidemia 01/21/2022   Lumbar stenosis 01/21/2022   Stroke (cerebrum) (HCC) 01/21/2022   S/P lumbar spinal fusion 11/15/2014   Lumbar discitis 11/29/2013   Discitis 11/24/2013   Diarrhea 11/17/2013   Osteomyelitis of lumbar spine (HCC) 11/16/2013   Hypertension    Asthma    Anxiety   SPEECH THERAPY RENEWAL/DISCHARGE SUMMARY  Visits from Start of Care: 42  Current functional level related to goals / functional outcomes: See goals below. Pt will be seen today  and will be discharged.   Remaining deficits: See below. Reading comprehension, verbal apraxia, and min/mild expressive aphasia   Education / Equipment: See "today's treatment" below   Patient agrees to discharge. Patient goals were met. Patient is being discharged due to meeting the stated rehab goals..     ONSET DATE: 01-28-22   REFERRING DIAG: I63.9 (ICD-10-CM) - Cerebral infarction, unspecified   THERAPY DIAG:  Verbal apraxia  Aphasia  Cognitive communication deficit  Dysarthria and anarthria  Rationale for Evaluation and Treatment Rehabilitation  SUBJECTIVE:   SUBJECTIVE STATEMENT: "I hear your voice in my head saying, 'slow down'." Pt accompanied by: self  PERTINENT  HISTORY: history significant for 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  She lives with her spouse in a 2 store home. Husband says she can stay on the first floor if needed. About 5 steps to enter the home. Husband works during the day.  Patient reported independent prior to admission without assistive device.  Presented back to ED on 01/28/2022 with acute onset of right-sided weakness and aphasia.  Patient just recently returned home from traveling to [place] 5 days ago.     PAIN:  Are you having pain? Yes - lower back - 3/10 Pain description: sore  PATIENT GOALS:  Improve verbal communication  OBJECTIVE:    PATIENT REPORTED OUTCOME MEASURES (PROM): Communication Effectiveness Survey: Pt returned initial CES 04-02-22 and scored 15/32 (higher scores indicate better effectiveness/QOL).    TODAY'S TREATMENT:  10/01/22: SLP and pt engaged in simple to complex conversation throughout today's session and she communicated functionally with all levels. Conversation at simple and simple-mod complex was nearly-WNL. SLP provided pt with some resources for aphasia when Chloe Johnson inquired about these.  Chloe Johnson is reading words out loud PRN and this assists her reading comprehension. Pt expressed thanks to SLP for working with her.  09/17/22: Pt entered with "s" statement so SLP assisted pt with "sk" blend. SLP noted pt's speech overall today was more hindered and less fluent/fluid than previous 2 sessions. After SLP talked with pt about "taking a second to refocus" her speech became more fluent and relaxed. SLP educated pt on how much her internal state affects her  speech and encouraged her to refocus as much as necessary and with SLP assistance generated a visual image of "relaxed" and SLP suggested she take deep breaths and focus on this image when she can feel her speech making her anxiety and stress level increase. Pt also thought it would be helpful to "have a beat" that would assist her in calming herself internally. She will look into this for next session. SLP told pt d/c next session. Pt stated she was going to reinitiate Constant Therapy before next session. SLP agreed with pt.  Lastly pt was concerned that after reading for an extended time she spent 15 minutes on one sentence and "my reading was not the same after that." SLP re-educated pt that this was most likely due to fatigue/attention and at that time pt needed to take a break from reading and return 30 minutes later. Pt stated with a smile, "But that's going to mess up my productivity for the day." SLP stressed the need to take breaks during the day (as all activities will take more to  complete post CVA with pt's deficits).  09/02/22: With pt's "s" statement SLP tracked accuracy of pt's /s/ production in s-blends and in words, in all positions. Overall production 90% accurate, which surprised pt. "Maybe it's when I'm tired," she said. Pt to track this for next session.  Pt having difficulty with "reading ahead" and comprehension is "behind". SLP told pt to use attention window for reading that SLP made pt some sessions ago and see how this works. SLP also told pt that she may notice this more with more complex written material than with simpler written material. Pt to be d/c'd in 1-2 visits.  08/26/22: With mod amount of pausing and hesitation pt explained her reading on [facility] functionally (simple to mod complex conversation) over 6 minutes with x3-4 instances that SLP had to take the concentration off of her message and put attention on what she said.  Her verbal expression in mod complex  conversation continued as more labored than in the last 2 sessions, which have gone more smoothly with verbal expression in simple, mod complex, and complex conversational levels. Pt remarked to SLP "S" statement about the Lord's Prayer Sunday in church and this was disturbing to her. SLP had pt type out the Lord's Prayer and she got the first three lines typed (to "thy kingdom come") and then told SLP she was distracted by noise in the gym and req'd min cues for the remainder until "for thine.." which she was told to complete at home.   08/19/22: Pt kept a slower rate and in 20 minutes conversation maintained articulation that did not require SLP to take focus off of her message to focus on intelligibility. After this time pt began to have articulatory errors approx 1-2/utterance due to fatigue. Pt to read on [employer] for homework. Three more appointments in this plan of care. Third appointment will be two weeks.  08/13/22: Today SLP targeted slowed speech/compensations with pt in conversation over 30 minutes. Pt with excellent success with speech intelligibility and maintaining slowed speech rate. She rated her stress at 7/10, however, due to leaving for the weekend and needing to pack and prepare for the time away.   08/07/22: Pt stated even with her card for compensating for decr'd attention, reading was harder yesterday due to greater amount of distraction thinking about getting husband's birthday gift after she was done reading.  Given "S" statement, and pt's level of error today with verbal expression (speech was worse today than on 07/30/22) SLP asked pt what she did yesterday other than go to [employer] to pick out 2 birthday cards for her husband. ("I must have read every birthday card in [employer]!") She also wrote and addressed "[name]" in/on all the cards and then went out to dinner with friends last night. SLP educated pt again about "asset management" with her speech. She was planning on going out  tonight with friends and SLP suggested pt not do anything that would tax her brain for the rest of the day, as she had a tiring brain day yesterday, and was going to also do something tonight as well. SLP congratulated pt on slowing her pace of life down somewhat since January/February, but cautioned her she will need to think about "allocating speech assets" and "brain assets" and taking necessary rest/break times - sometimes for the majority of the day if she is doing something tiring in the evening. Pt endorsed in evening her speech is much more error-ridden, describing it as "jibberish". SLP  used this example to reinforce resting the brain is a good thing for her.  07/30/22: Chloe Johnson said the salon where she read the novel was distracting and she could only read for approx 10 minutes before needing to put the book down. 20 minutes would have been the limit in a quiet environment. Pt recorded a reading of the passage she has chosen to record at intervals to track her progress with speech intelligibility.  Today SLP guided pt through her HEP and emphasized the necessity to slow rate of speech and how her other life stressors play a negative role in her speech fluidity. Spoke to pt about the need for timing of more taxing speech tasks, relaxing, and taking breaks if necessary. SLP brought up decr ST to once/week starting next week due to progress.   07/28/22: Pt has done reading twice since last session and found that she needs to use finger as her guide, for visual focus. She plans to use a card for this from now on (see "S" statement).  SLP and pt talked for 20 minutes - speech was functional but particular difficulty with consonant blends. Slp provided handout with str, spl, thr, shr, [place]/kr blends for pt to practice. SLP reminded pt about finger tapping/rhythm and had her complete two of these in sentence responses with tapping on the blend word in the sentence which improved accuracy of articulation. Pt provided  homework for this.  07/23/22: Chloe Johnson endorses high stress right now with other matters, for the last 1-2 weeks. Speech is comparable with last session. Pt was at restaurant last night - noisy. Pt self-advocated with her speech. She is OK with friends assisting her with her verbal expression. Pt has been doing homework phrases. Pt rated her comfort level with verbal communication at 7/10 in both family situations and other social situations. (1=avoiding talking altogether, 10=comfortable to talk whatever the resulting circumstances may be) Pt used finger as her guide when reading last night, to assist visual concentration on the words. SLP also reiterated pt can mouth words to assist with this, and that she will not mouth words with perfect articulation but that is fine.   07/21/22: Pt exhibits decr'd speech fluency today compared to last session, more hesitant, and incidents of false starts. SLP had pt read her tongue twisters today and reduce rate of speech when experiencing difficulty. Consonant blends were particularly more challenging for pt today than in previous 2 sessions.    07/15/22: SLP engaged pt in conversation targeting mod complex conversation. Pt was functional today, again. SLP educated pt about nature of rehab progress over time.  07/14/22: "I'm making an effort to speak slower." In 30 minutes of 65% simple, 35% mod complex conversation pt was functional with speech-language ability, requiring one request from SLP to slow pt speech rate, and one request for a repeat for this listener.   07/11/22: Given "s", SLP worked with pt on her reading comprehension when her limit of sustained attention is reached. Pt read an online article about TBI/CVA overstimulation for 11 minutes and did not require a brain break, as she was able to focus the entire time on the selection. A second paper/magazine article was provided to pt and she read this for 8 minutes and Chloe Johnson reported feeling like she was  experiencing decr'd attention - with gym noise and deli noise on youtube (by SLP) - mod noisy environment. SLP suggested she read out loud to herself to focus her attention, which was successful in keeping  her attention. Pt stated, "I was able to get back on track again." Pt noted to have more difficulty with her speech today for a second straight session- she told SLP that this week has been busier and more stressful as husband was ill most of the week and chores and other household duties fell on her.  07/09/22: Pt hasn't done Constant Therapy since last week. Pt reported her cue to use slower rate is internally noting she is talking too fast and this has resulted in pt actively slowing her rate - she hears SLP voice telling her to reduce speech rate.  Regarding her reading, "it goes good for a while and then I find myself in a loop" - pt explains the "loop" is her being unclear about a word or a phrase. Also with reading pt will not know a word she is reading, can be a simple word. Pt is having reading comprehension issues very likely due to cognition/attention. She will keep track of when this occurs until next session.  Today pt's speech was functional with mod complex topics. Pt self-corrected 4 times during session.  07/03/22: Pt had ?s with Constant Therapy so SLP and pt worked at [place] her exercises. SLP worked in Engineer, technical sales context with pt today. Initially pt's speech was at a faster rate and error patterns were more frequent, until SLP cued pt to reduce her rate and then after this frequency of error decr'd and pt's speech became functional. External cues discussed with pt to foster carryover of reduced rate.  Pt to cont to practice words with specific targeted phonemes at home, and also Constant Therapy.  07/01/22: SLP worked with pt with some words which she practiced and struggled to say. Pt req'd SLP min to max A including demo for 60% of these words. Some were rarely spoken words and SLP  encouaged pt to practice on the words with the same sound in the same position but were more frequently said. SLP discussed a TED TALK ("I love TED talks!", pt stated) practicing slower, controlled rate of speech. Pt with notable difficulty with words >3 syllables in length. Success rate in functional articulation with these words was 80%. SLP strongly encouraged pt to see how technical reading goes with her - if attention is challenging for her or not.And if so compenastion of reading out loud to herself and accuracy with what she is saying does NOT matter - just using verbalization as a focusing tool.   06/27/22: Pt saw friends in the lobby and used compensations for successful communication. Today SLP and pt focused on tri-consonant blends in initial position. Pt with 83% success when slowing rate. SLP targeted these words and pt will cont to practice at home.   06/25/22: Pt functionally described her weekend in 20  minutes conversation with slowed rate and produced functional speech. Notable challenges with STR, SKR, SKW. Pt self corrected x5 today when speech was too difficult to understand, which has not occurred to that degree before in session. Homework for pt to practice /S/ tri-consonant blends.   06/18/22: SLP had pt read Wikipedia article about Ellard Artis, telling her to stop and tell SLP when she thinks she is losing attention on the article. After 9 minutes SLP stopped pt and she was still focused on the article, but could not answer questions about the article without extra time or looking back at article. Pt demonstrated her comprehension was WFL/WNL by this. SLP then printed article and pt used highlighter to mark  pertinent info in the text as compensation to improve attention/memory. She remarked, "It was harder to focus when I heard them talking in the gym" and SLP reiterated compensations for attention with pt.    PATIENT EDUCATION: Education details: see "today's treatment" Person educated:  Patient Education method: Possible Explanation, Demonstration, and/or Verbal cues Education comprehension: If necessary, SLP ensured pt verbalized understanding   GOALS: Goals reviewed with patient? Yes  SHORT TERM GOALS: Target date: 04/15/2022  (extended two weeks due to visit count)   Pt will complete HEP ("tongue twisters") with speech compensations to foster 80% accuracy  Baseline: Goal status: Met  2.  Pt will produce phrase responses with multiple attempts, functional responses, 80% of the time in 3 sessions Baseline:  Goal status: Partially met  3.  Pt will produce "ch" + vowel syllables/words 80% success over 3 sessions Baseline: 04-02-22, 04-07-22 Goal status: Met  4.  Pt will complete PROM in first 3 sessions Baseline:  Goal status: Met  5.  Assess cognition if clinically indicated, in first 6 sessions Baseline:  Goal status: Deferred - cognition WNL  6.  Pt will produce loud /a/ with average mid -upper 70s dB in 3 sessions Baseline:  Goal status: Deferred - working on apraxia/aphasia  7. Pt will demonstrate error awareness with aphasic errors in 85% of opportunities.     Baseline:     Goal Status: Met  LONG TERM GOALS: Target: 05/20/2022 ,2-10.24, 08-26-22  Pt will complete HEP ("tongue twisters") with speech compensations to foster 90% accuracy Baseline: 06/11/22, 07-30-22 Goal status: met  2.  Pt will produce at least 4 word sentence responses with 3 or less attempts, functional responses, 80% of the time in 3 sessions Baseline: 04-23-22, 06-06-22 Goal status: met  3.  Pt will produce initial "ch" + vowel words in 3-5 word sentences 80% success over 3 sessions Baseline: 04/30/22, 05/05/22 Goal status: Met  4.  Pt will compensate for attention PRN when reading, ensuring comprehension of written message demo'd by answering yes/no or WH ?s 90% success over three sessions Baseline: 06/16/22, 07/11/22, 09/02/22 Goal status: met  5.  Pt will produce loud /a/ with  average upper 70s dB in 3 sessions Baseline:  Goal status: Deferred - work on speech accuracy  6.  Pt will participate in MBS if clinically indicated Baseline:  Goal status: Deferred- Pt without difficulty with swallowing currently  7.  Pt will demonstrate error awareness with aphasic errors in 85% of opportunities, in 3 sessions   Baseline: 06/09/22, 06/16/22   Goal Status: met  8.  Pt will demonstrate functional comprehension of mod-max complex reading selections, using comprehension compensations, in 3 sessions.   Baseline: 06/16/22, 07-30-22 (reported), 08-26-22   Goal Status: met  9.  Pt will engage in 20+ minutes conversation with functional speech intelligibility x3 sessions Baseline: 08/13/22, 08/19/22 Goal status: met ASSESSMENT:  CLINICAL IMPRESSION: Discharge today after today's session focusing on above goals. Chloe Johnson is a 67 y.o. female who was seen today for cont'd treatment of oral motor/apraxia of speech, and aphasia. SEE TODAY'S TX NOTE. D/c today.   OBJECTIVE IMPAIRMENTS include attention, aphasia, apraxia, dysarthria, and voice disorder. These impairments are limiting patient from return to work, ADLs/IADLs, and effectively communicating at home and in community. Factors affecting potential to achieve goals and functional outcome are severity of impairments. Patient will benefit from skilled SLP services to address above impairments and improve overall function.  REHAB POTENTIAL: Good  PLAN: Discharge today.  PLANNED INTERVENTIONS: Internal/external aids, Oral motor exercises, Functional tasks, Multimodal communication approach, SLP instruction and feedback, Compensatory strategies, and Patient/family education    [facility], CCC-SLP 10/01/2022, 12:51 PM   .[email or web address]

## 2022-10-01 NOTE — Telephone Encounter (Signed)
Requested documentation printed. Pt notified during clinic visit 10/01/22. Further testing ordered and pt not able to return to work yet.

## 2022-10-01 NOTE — Patient Instructions (Signed)
Medication Instructions:  STOP ENTRESTO START Spironolactone 12.5mg  daily START Valsartan 80mg  daily DECREASE Potassium Chloride to twice daily *If you need a refill on your cardiac medications before your next appointment, please call your pharmacy*  Lab Work: BMET in 2 weeks If you have labs (blood work) drawn today and your tests are completely normal, you will receive your results only by: MyChart Message (if you have MyChart) OR A paper copy in the mail If you have any lab test that is abnormal or we need to change your treatment, we will call you to review the results.   Testing/Procedures: Pulmonary Function Testing Your physician has recommended that you have a pulmonary function test. Pulmonary Function Tests are a group of tests that measure how well air moves in and out of your lungs.  Follow-Up: At [facility], you and your health needs are our priority.  As part of our continuing mission to provide you with exceptional heart care, we have created designated Provider Care Teams.  These Care Teams include your primary Cardiologist (physician) and Advanced Practice Providers (APPs -  Physician Assistants and Nurse Practitioners) who all work together to provide you with the care you need, when you need it.  Your next appointment:   2 month(s)  Provider:   Kristeen Miss, MD

## 2022-10-03 ENCOUNTER — Encounter: Payer: Self-pay | Admitting: Cardiovascular Disease

## 2022-10-06 NOTE — Telephone Encounter (Signed)
FMLA documentation completed 10/03/22 and placed up front for [employer]. Pt aware.

## 2022-10-07 NOTE — Therapy (Signed)
OUTPATIENT OCCUPATIONAL THERAPY  Treatment Note   Patient Name: Chloe Johnson MRN: [id number] DOB:[birth year], 67 y.o., female Today's Date: 10/01/2022  PCP: Thana Ates, MD REFERRING PROVIDER: Horton Chin, MD    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/16/2013   L3-4/notes 11/24/2013, arms, neck   Exertional asthma    GERD (gastroesophageal reflux disease)    Heart attack (HCC) 01/18/2022   Hypertension    Kidney stones    "have always passed them"   Neck pain    Osteomyelitis (HCC) 11/23/2013   osteomyelitis, discitis    Sleep concern    uses Prozac for sleep    Stroke (HCC) 01/21/2022   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; left with aphasia, dysarthria, and apraxia   Past Surgical History:  Procedure Laterality Date   BREAST CYST EXCISION Left 2010   "polypectomy"   IR CT HEAD LTD  01/21/2022   IR CT HEAD LTD  01/28/2022   IR PERCUTANEOUS ART THROMBECTOMY/INFUSION INTRACRANIAL INC DIAG ANGIO  01/21/2022   IR PERCUTANEOUS ART THROMBECTOMY/INFUSION INTRACRANIAL INC DIAG ANGIO  01/28/2022   IR US GUIDE VASC ACCESS RIGHT  01/22/2022   PICC LINE PLACE PERIPHERAL ([facility] HX) Right    for use of Levaquin & Vancomycin, of note: she reports that she had a "flulike feeling"    RADIOLOGY WITH ANESTHESIA N/A 01/21/2022   Procedure: IR WITH ANESTHESIA;  Surgeon: Julieanne Cotton, MD;  Location: [facility] OR;  Service: Radiology;  Laterality: N/A;   RADIOLOGY WITH ANESTHESIA N/A 01/28/2022   Procedure: IR WITH ANESTHESIA;  Surgeon: Radiologist, Medication, MD;  Location: [facility] OR;  Service: Radiology;  Laterality: N/A;   TONSILLECTOMY AND ADENOIDECTOMY  1975   TUBAL LIGATION  1999   Patient Active Problem List   Diagnosis Date Noted   Aphasia 03/31/2022   Snores 03/31/2022   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   UTI (urinary tract infection) 02/04/2022   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   Stroke determined by clinical  assessment (HCC) 01/28/2022   Middle cerebral artery embolism, left 01/28/2022   H/O ischemic left MCA stroke    Acute respiratory failure (HCC)    CVA (cerebral vascular accident) (HCC) 01/21/2022   NSTEMI (non-ST elevated myocardial infarction) (HCC) 01/21/2022   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   HFrEF (heart failure with reduced ejection fraction) (HCC) 01/21/2022   Hyperlipidemia 01/21/2022   Lumbar stenosis 01/21/2022   Stroke (cerebrum) (HCC) 01/21/2022   S/P lumbar spinal fusion 11/15/2014   Lumbar discitis 11/29/2013   Discitis 11/24/2013   Diarrhea 11/17/2013   Osteomyelitis of lumbar spine (HCC) 11/16/2013   Hypertension    Asthma    Anxiety     ONSET DATE: 01/28/22  REFERRING DIAG: I63.9 (ICD-10-CM) - Cerebral infarction, unspecified   THERAPY DIAG:  Hemiplegia and hemiparesis following cerebral infarction affecting right dominant side (HCC)  Muscle weakness (generalized)  Other lack of coordination  Other disturbances of skin sensation  Rationale for Evaluation and Treatment Rehabilitation  PERTINENT HISTORY: PMH: HTN, cervical radiculopathy, hepatitis, osteomyelitis/discitis, lumbar stenosis with foot drop anxiety, GERD, insomnia  PRECAUTIONS: Fall;  WEIGHT BEARING RESTRICTIONS was awaiting spinal fusion surgery, limit bending, twisting, lifting >10#   SUBJECTIVE:   SUBJECTIVE STATEMENT: Pt states ***  has a bit of pain as her activity levels are up and they are decreasing her lyrica.   PAIN:  Are you having pain?  Yes: NPRS  scale: *** 3/10 Pain location: R hip Pain description: aching Aggravating factors: doing anything Relieving factors: nothing  FALLS: Has patient fallen in last 6 months? Yes. Number of falls 2  LIVING ENVIRONMENT: Lives with: lives with their spouse Lives in: House/apartment Stairs: Yes: Internal: full flights of steps; on right going up and External: 7 steps; on left going up Has following equipment at home:  Single point cane, Walker - 2 wheeled, Walker - 4 wheeled, shower chair, and bed side commode  PLOF: Independent  PATIENT GOALS I want my arm back.     OBJECTIVE: (All objective assessments below are from initial evaluation on: 02/25/22 unless otherwise specified.)   HAND DOMINANCE: Right  ADLs: Transfers/ambulation related to ADLs: Mod I with no use of  Eating: Husband assists with cutting, feeding self with L hand Grooming: utilizing L hand with opening and brushing teeth UB Dressing: unable to manage buttons, otherwise Mod I LB Dressing: unable to tie shoes, just slipping on toes Toileting: Mod I with use of L hand Bathing: Mod I, utilizing L hand to shave Tub Shower transfers: Mod I Equipment: Shower seat with back, Walk in shower, and bed side commode   IADLs: Light housekeeping: laundry and hanging up clothes, wipes counters/sink, needs assist to fold clothing Meal Prep: no, did not cook pre-stroke Medication management: husband opens pill bottles Handwriting: unable  MOBILITY STATUS: Needs Assist: Supervision - Mod I without use of AD  POSTURE COMMENTS:  No Significant postural limitations  ACTIVITY TOLERANCE: Activity tolerance: WNL for tasks assessed on eval  FUNCTIONAL OUTCOME MEASURES: FOTO: 49  UPPER EXTREMITY ROM     Active ROM Right eval Left eval Rt 10/01/22  Shoulder flexion WNL WNL   Shoulder abduction     Shoulder adduction     Shoulder extension     Shoulder internal rotation   Approx 35  Shoulder external rotation   Approx 75*  Elbow flexion     Elbow extension -5 WNL   Wrist flexion 38 WNL Aprox 70  Wrist extension 50 WNL Approx 40  Wrist ulnar deviation     Wrist radial deviation     Wrist pronation     Wrist supination     (Blank rows = not tested)   UPPER EXTREMITY MMT:     MMT Right eval  Shoulder flexion 4+/5  Shoulder abduction   Shoulder adduction   Shoulder extension   Shoulder internal rotation   Shoulder external  rotation   Middle trapezius   Lower trapezius   Elbow flexion 4+/5  Elbow extension 4+/5  Wrist flexion   Wrist extension   Wrist ulnar deviation   Wrist radial deviation   Wrist pronation   Wrist supination   (Blank rows = not tested)  HAND FUNCTION: Loose gross grasp, unable to squeeze dynamometer 06/06/22: R: 15#  COORDINATION: Box and Blocks:  Right 10 blocks, Left 53 blocks Modified performance with RUE with therapist holding blocks in palm and pt able to complete gross grasp to pick up one at a time from OT palm of hand, minimal hand opening/closing 04/02/22: Right: 16 blocks  SENSATION: 10/01/22: In right hand light touch is decreased especially in median nerve distribution and mildly in ulnar nerve distribution (this is also apparent with muscle testing of the median and ulnar innervated muscles).  Radial nerve is not significantly affected today.  VISION: Subjective report: Pt reports that her R eye does not close as well and that she feels that her L  eye is compensating for her R eye. Baseline vision: Wears glasses all the time ---------------------------------------------------------------------------------------------------------------------------------------------------------------------  TODAY'S TREATMENT:  10/08/22: *** Try dry needling to tight spasming bicep on the right arm, try to determine the effect of internal rotation stretches on tight internal rotation shoulder as well as median nerve glides for typically numb median nerve.  Finish the rest of her HEP with her   10/01/22: Due to main complaints of paresthesia in the median nerve distribution, decreased hand use and coordination, stiffness in the right shoulder-OT provides an updated home exercise program that includes neuromuscular re-education for nerve gliding, fine motor skills coordination with in hand manipulation skills, stretches for tight shoulder and tight wrist, and therapy putty activities for targeting  ulnar median nerve muscle wasting.  Unfortunately not all were able to be: Through the session and so this will be continued on in the next session as possible.  OT also notes that she has bicep spasm and would likely benefit from dry needling this could be contributory towards median nerve dysfunction as well. She is in agreement to try dry needling next session, and states that she will work on new HEP until next week.   Exercises - Median Nerve Flossing  - 2-3 x daily - 5-10 reps - Ulnar Nerve Flossing  - 3-4 x daily - 1-2 sets - 5-10 reps - Sleeper Stretch  - 3-4 x daily - 3-5 reps - 15 hold - Standing neck/upper traps stretch  - 4-6 x daily - 3-5 reps - 15 sec hold - Doorway Stretches (both arms low, lean in gently)   - 3-4 x daily - 3-5 reps - 15 hold - Fridge Door Stretch  - 4 x daily - 3-5 reps - 15 sec hold - Wrist Prayer Stretch  - 4 x daily - 3-5 reps - 15 sec hold - Finger Spreading  - 4-6 x daily - 10-15 reps - Thumb Opposition  - 4-6 x daily - 10 reps - Thumb stretch  - 4 x daily - 3-5 reps - 15-20 sec hold - Tendon Glides  - 4-6 x daily - 3-5 reps - 2-3 seconds hold - Full Fist  - 2-3 x daily - 5 reps - "Duck Mouth" Strength  - 2-3 x daily - 5 reps - Finger Extension "Pizza!"   - 2-3 x daily - 5 reps - Thumb Opposition with Putty  - 2-3 x daily - 5 reps - Cutting Putty  - 2-3 x daily - 5 reps - Finger Spread  - 2-3 x daily - 5 reps  In-Hand Manipulation Skills Rotation:  Hold pen, try to "twirl" like a baton, keeping flat with surface of table. Try going BOTH directions 10x  Flip:  Hold pen in writing position, flip in an arch to "erase" position, then back to "write" position. Do not lift hand off table.  10x  Translation:  Open hand palm up,  put an object in your palm and then use your fingers and thumb to move it to the tips of your fingers, pinched against your thumb.  10x (bigger is easier (fat marker), smaller is harder (penny)) Shift:  Hold pen like a dart, start  "shifting" it forward until you are holding it at the base, then shift it backwards until you are holding it at the tip again. 10x (like putting a key in a key hole)    PATIENT EDUCATION: Education details: ongoing education with focus on coordination and progressive ROM Person educated: Patient Education  method: Explanation, Demonstration, and Verbal cues Education comprehension: verbalized understanding and needs further education   HOME EXERCISE PROGRAM: Access Code: [email or web address] URL: [email or web address] Date: 10/01/2022 Prepared by: Fannie Knee  Access Code: [email or web address] URL: [email or web address] Date: 08/04/2022 Prepared by: [facility] - [facility]   GOALS: Goals reviewed with patient? Yes   SHORT TERM GOALS: Target date: 10/10/22  1.  Pt will verbalize understanding of alternative treatment methods to aid in motor recovery and decrease of spasticity.  Baseline:  Goal status: In Progress  2.  Pt and caregiver will demonstrate ability to apply kinesiotape to RUE appropriately to facilitate decreased edema and facilitate increased motor recovery.  Baseline:  Goal status: In Progress    LONG TERM GOALS:  11/07/22  1.  Pt will demonstrate use of dominant RUE as gross assist to aid in managing clothing fasteners (tying shoe laces and buttons). Baseline:  Goal status: In Progress  2.  Pt will increase RUE pinch strength by 3# to increase success with holding and manipulating writing and/or eating utensil.  Baseline: key pinch: 5#, 3 point pinch: 2#  Goal status: In Progress  3.  Pt will be able to complete 9 hole peg test with RUE in 2 min time limit to demonstrate increased fine motor control as needed for ADLs/IADLs.  Baseline: 4-5 pegs in 2 min time limit  Goal status: In Progress  4.  Pt will demonstrate increased functional reach, internal rotation, and grasp strength with RUE to tuck in shirt and aid in hygiene  post toileting.    Baseline:   Goal status: In Progress   ASSESSMENT:  CLINICAL IMPRESSION: 10/08/22: ***  10/01/22: She has been working with therapy periodically now for many months, and this is not entirely typical.  She was educated that as soon as she receives maximum benefit from therapy services, she should be able to transition to self-care and management at home and discharge therapy (especially if she had not been making any significant gains recently).  Fortunately today OT is able to add new ideas to help with her paresthesia and nerve issues and their effectiveness will be determined in the next 1 to 2 weeks.  She is hesitant to want to discharge therapy anytime soon but was reassured that she can always return at a different point in time if self-management does not work well or she has a significant change in status.    PLAN: OT FREQUENCY: 1-2x/week  OT DURATION: 8 weeks  PLANNED INTERVENTIONS: self care/ADL training, therapeutic exercise, therapeutic activity, neuromuscular re-education, manual therapy, passive range of motion, balance training, functional mobility training, splinting, electrical stimulation, ultrasound, compression bandaging, moist heat, cryotherapy, patient/family education, psychosocial skills training, energy conservation, coping strategies training, and DME and/or AE instructions  CONSULTED AND AGREED WITH PLAN OF CARE: Patient and family member/caregiver  PLAN FOR NEXT SESSION:  ***   Fannie Knee, OTR/L 10/01/2022, 1:22 PM

## 2022-10-08 ENCOUNTER — Ambulatory Visit: Payer: Federal, State, Local not specified - PPO | Admitting: Rehabilitative and Restorative Service Providers"

## 2022-10-08 ENCOUNTER — Encounter: Payer: Self-pay | Admitting: Rehabilitative and Restorative Service Providers"

## 2022-10-08 DIAGNOSIS — R4701 Aphasia: Secondary | ICD-10-CM | POA: Diagnosis not present

## 2022-10-08 DIAGNOSIS — M6281 Muscle weakness (generalized): Secondary | ICD-10-CM

## 2022-10-08 DIAGNOSIS — I69351 Hemiplegia and hemiparesis following cerebral infarction affecting right dominant side: Secondary | ICD-10-CM | POA: Diagnosis not present

## 2022-10-08 DIAGNOSIS — R208 Other disturbances of skin sensation: Secondary | ICD-10-CM

## 2022-10-08 DIAGNOSIS — R482 Apraxia: Secondary | ICD-10-CM | POA: Diagnosis not present

## 2022-10-08 DIAGNOSIS — R278 Other lack of coordination: Secondary | ICD-10-CM

## 2022-10-08 DIAGNOSIS — R41841 Cognitive communication deficit: Secondary | ICD-10-CM | POA: Diagnosis not present

## 2022-10-08 DIAGNOSIS — R471 Dysarthria and anarthria: Secondary | ICD-10-CM | POA: Diagnosis not present

## 2022-10-14 ENCOUNTER — Encounter: Payer: Self-pay | Admitting: Physical Medicine and Rehabilitation

## 2022-10-14 ENCOUNTER — Encounter
Payer: Federal, State, Local not specified - PPO | Attending: Registered Nurse | Admitting: Physical Medicine and Rehabilitation

## 2022-10-14 VITALS — BP 130/84 | HR 80 | Ht 60.0 in | Wt 143.0 lb

## 2022-10-14 DIAGNOSIS — M48062 Spinal stenosis, lumbar region with neurogenic claudication: Secondary | ICD-10-CM | POA: Insufficient documentation

## 2022-10-14 DIAGNOSIS — I10 Essential (primary) hypertension: Secondary | ICD-10-CM | POA: Insufficient documentation

## 2022-10-14 DIAGNOSIS — R29898 Other symptoms and signs involving the musculoskeletal system: Secondary | ICD-10-CM | POA: Insufficient documentation

## 2022-10-14 DIAGNOSIS — M25511 Pain in right shoulder: Secondary | ICD-10-CM | POA: Insufficient documentation

## 2022-10-14 DIAGNOSIS — T148XXA Other injury of unspecified body region, initial encounter: Secondary | ICD-10-CM | POA: Insufficient documentation

## 2022-10-14 DIAGNOSIS — Z8673 Personal history of transient ischemic attack (TIA), and cerebral infarction without residual deficits: Secondary | ICD-10-CM | POA: Insufficient documentation

## 2022-10-14 DIAGNOSIS — R4701 Aphasia: Secondary | ICD-10-CM | POA: Insufficient documentation

## 2022-10-14 DIAGNOSIS — M75111 Incomplete rotator cuff tear or rupture of right shoulder, not specified as traumatic: Secondary | ICD-10-CM | POA: Diagnosis not present

## 2022-10-14 DIAGNOSIS — E663 Overweight: Secondary | ICD-10-CM | POA: Diagnosis not present

## 2022-10-14 DIAGNOSIS — J209 Acute bronchitis, unspecified: Secondary | ICD-10-CM | POA: Diagnosis not present

## 2022-10-14 DIAGNOSIS — I509 Heart failure, unspecified: Secondary | ICD-10-CM | POA: Insufficient documentation

## 2022-10-14 MED ORDER — TOPIRAMATE 50 MG PO TABS: 50.0000 mg | ORAL_TABLET | Freq: Two times a day (BID) | ORAL | 3 refills | Status: DC

## 2022-10-14 NOTE — Progress Notes (Signed)
Subjective:    Patient ID: Chloe Johnson, female    DOB: November 24, 1955, 67 y.o.   MRN: [id number]  HPI:   SUTTER DRILLING is a 67 y.o. female who is here for f/u of right shoulder pain, Left Middle Cerebral Artery Stroke, Left Ventricular Thrombus and Essential Hypertension.   Dr. Iver Nestle H&P on 01/28/2022 HPI: Chloe Johnson is a 67 y.o. 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 Amada Jupiter, with key details confirmed by husband Chloe Johnson  is a 71 y.o.  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 She presents today after the following series events.  Around 12 PM this past Saturday, the patient noted sudden onset dizziness described as "vertigo" followed by nausea then followed by an episode of vomiting.  She next felt a sudden onset sharp chest pain with radiation to her back.  She assumed it was her cervical radiculopathy "acting up" because shortly after this event, she was holding 2 plates in her hand to feed her cats when she suddenly had no control of both upper extremities and dropped said plates.  On Sunday, she felt generally weak but then improved from all of this until this morning, around 9 AM, when she experienced word finding difficulties.  Her husband, who is at bedside, also notes she had a right facial droop transiently that lasted no more than a few minutes.  She may have had 2 subsequent episodes, as documented by ED  physician; however, all of her is told by the patient's husband that this was 1 single episode."   Her hospital course was noted for acute worsening for which she underwent thrombectomy and was discharged with only some very mild residual aphasia not detectable on examination but noticed by the patient per husband.  He notes that she has been in her normal state of health since discharge other than a visit to the ED on 9/3 with left shoulder pain which was attributed to her known cervical stenosis for which she is planned for surgery in the near future   Husband reports that she was taking his blood pressure when suddenly the blood pressure monitor fell off the table.  She tried to catch it but lost her balance had weakness in both of her legs.  Initially she got to both both arms but then became weak on the right side.  She was unable to speak at all suddenly during this event.  Work-up revealed a left ventricle apical thrombus.   Has been reports she has not missed any of her doses of medication including Eliquis   CTA was attempted to confirm recurrent acute occlusion, however due to IV infiltration and severity of her symptoms decision was made to proceed directly to IR which was discussed both with [name] as well as with the patient's husband   MR Brain:  IMPRESSION: 1. 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 2. No intracranial large vessel occlusion or significant stenosis. In particular, left MCA branches appear perfused and without focal stenosis.  MRA:  IMPRESSION:  1. 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 2. No intracranial large vessel occlusion or significant stenosis. In particular, left MCA branches appear perfused and without focal stenosis.  [name] COURSE Stroke:  left MCA punctate scattered infarct with left M2 occlusion s/p IR with TICI2c, embolic secondary to LV thrombus  recently on Eliquis CT no acute abnormality IR left superior M2 occlusion, inferior M3/M4 nonocclusive thrombus Status post EVT with TICI2c MRI left insular cortex and frontal lobe punctate infarcts MRA unremarkable 2D Echo EF 45 to 50% on 8/29, LV regional motion abnormality with LV thrombus LDL 150 on 8/29 HgbA1c 5 0 on 8/29 UDS positive for THC Hypercoagulable and autoimmune work up negative Heparin IV for VTE prophylaxis aspirin 81 mg daily and Eliquis (apixaban) daily prior to admission,  now on coumadin with lovenox bridge. INR goal 2.5 Continue ASA 81 per cardiology. Ongoing aggressive stroke risk factor management Therapy recommendations:  CIR Disposition:  pending   Hx of stroke and LV thrombus 8/29 admitted for nausea vomiting dizziness chest pain and then developed right facial droop and aphasia.  Found to have troponin > 300, EF 45 to 50% with LV thrombus.  CTA coronary artery showed minimal CAD.  CT no acute abnormality.  CTA head and neck showed left M2 occlusion.  CTP 0/7.  Status post EVT with TICI2c.  MRI showed left frontal Bora matter punctate infarct.  Repeat MRI showed left external capsule, insular cortex and frontal cortex punctate infarcts.  LDL 150, A1c 5.0.  UDS positive for THC.  Patient was put on heparin IV and aspirin and eventually discharged on Eliquis and aspirin as well as Crestor 20. Per family, patient has slight expressive aphasia since discharge, no physical deficit. Cardiology [name] on board, recommend coumadin with INR goal 2.5. on lovenox bridge.    [name] was admitted to inpatient rehabilitation on 02/04/2022 and discharged home on 02/07/2022. She will begin outpatient Therapy on [facility]/25/[facility] at [facility].  [name] has expressive aphasia.  She was positive with THC, she reports she uses CBD products for anxiety, this was discussed with [name]. [name] stated she can continue with her CBD product, she  verbalizes understanding.   1) Right shoulder pain  -pain has been severe -she was not sure if she wanted to increase the topamax -she has been waking up and doing more since she went down on the lyrica -started suddenly when she was doing an activity (history is limited by her aphasia) -she feels this pain is more than just spasticity -it is very severe -she has been doing better with the increase in Lyrica to 200mg  BID -XR discussed and she would like to get this -she prefers nonsurgical treatment if possible.  -she discussed with Dr. Everardo Pacific and surgery was not recommended due to risk of re-tear -she is scheduled for steroid injection -she would be interested in starting topamax which we discussed before -pain has returned but she sees Dr. Everardo Pacific next week and she is going to ask for another injection -she thinks her shoulder is healing -positioning in car was causing the 10/10 of pain  2) Aphasia is greatly improving and she continues working with therapy -she is speaking so well today -not frustrated with speech but it is seems like it it shouldn't be this hard -she is still struggling -notes word finding difficuties -sometimes she feels frustrated as she can't find the thought she wants.  -she  is going to be discharged from therapy on may 8th -she feels that she continues to improve every day -she slows down when she sees confusion on someone's face -does not feel she can return to work yet   3) UTI: -pyridium helped with her UTI symptoms  4) CHF -Entresto has improved  5) HTN -BP has improved -she is taking the spironolactone and valsartan    Pain Inventory Average Pain 4 Pain Right Now 4 My pain is constant and aching  LOCATION OF PAIN  Neck,Wrist,Hand,Back  BOWEL Number of stools per week: 14  Type of laxative Super Cleanse  BLADDER Normal    Mobility walk without assistance how many minutes can you walk? As needed ability to climb steps?  yes do  you drive?  no Do you have any goals in this area?  yes  Function employed # of hrs/week 70  Neuro/Psych bowel control problems weakness numbness anxiety  Prior Studies Any changes since last visit?  no  Physicians involved in your care Any changes since last visit?  no   Family History  Problem Relation Age of Onset   Other Mother    Other Father    Hypertension Father    Cancer Sister    Sleep apnea Sister    Social History   Socioeconomic History   Marital status: Married    Spouse name: Leonette Most   Number of children: 3   Years of education: RN   Highest education level: Not on file  Occupational History    Employer: [employer]   Tobacco Use   Smoking status: Never    Passive exposure: Past   Smokeless tobacco: Never  Vaping Use   Vaping Use: Never used  Substance and Sexual Activity   Alcohol use: Yes    Alcohol/week: 2.0 standard drinks of alcohol    Types: 2 Shots of liquor per week    Comment: "a little"; 1/2 a shot x 3 per week   Drug use: No    Comment: takes CBD gummies for sleep and anxiety   Sexual activity: Yes    Birth control/protection: Post-menopausal  Other Topics Concern   Not on file  Social History Narrative   Patient is married and lives at home with her husband Leonette Most).  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 Right handed.College education.         Social Determinants of Health   Financial Resource Strain: Not on file  Food Insecurity: Not on file  Transportation Needs: Not on file  Physical Activity: Not on file  Stress: Not on file  Social Connections: Not on file   Past Surgical History:  Procedure Laterality Date   BREAST CYST EXCISION Left 2010   "polypectomy"   IR CT HEAD LTD  01/21/2022   IR CT HEAD LTD  01/28/2022   IR PERCUTANEOUS ART THROMBECTOMY/INFUSION INTRACRANIAL INC DIAG ANGIO  01/21/2022   IR  PERCUTANEOUS ART THROMBECTOMY/INFUSION INTRACRANIAL INC DIAG ANGIO  01/28/2022   IR US GUIDE VASC ACCESS RIGHT  01/22/2022   PICC LINE PLACE PERIPHERAL ([facility] HX) Right    for use of Levaquin & Vancomycin, of note: she reports that she had a "flulike feeling"    RADIOLOGY WITH ANESTHESIA N/A 01/21/2022   Procedure: IR WITH ANESTHESIA;  Surgeon: Julieanne Cotton, MD;  Location: [facility] OR;  Service: Radiology;  Laterality: N/A;   RADIOLOGY WITH  ANESTHESIA N/A 01/28/2022   Procedure: IR WITH ANESTHESIA;  Surgeon: Radiologist, Medication, MD;  Location: [facility] OR;  Service: Radiology;  Laterality: N/A;   TONSILLECTOMY AND ADENOIDECTOMY  1975   TUBAL LIGATION  1999   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/16/2013   L3-4/notes 11/24/2013, arms, neck   Exertional asthma    GERD (gastroesophageal reflux disease)    Heart attack (HCC) 01/18/2022   Hypertension    Kidney stones    "have always passed them"   Neck pain    Osteomyelitis (HCC) 11/23/2013   osteomyelitis, discitis    Sleep concern    uses Prozac for sleep    Stroke (HCC) 01/21/2022   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; left with aphasia, dysarthria, and apraxia   There were no vitals taken for this visit.  Opioid Risk Score:   Fall Risk Score:  `1  Depression screen [facility] 2/9     07/11/2022   10:19 AM 02/20/2022    9:22 AM 02/15/2014   11:06 AM 01/18/2014    2:15 PM 12/23/2013   10:14 AM  Depression screen PHQ 2/9  Decreased Interest 0 0 0 0 0  Down, Depressed, Hopeless 0 0 1 0 0  PHQ - 2 Score 0 0 1 0 0  Altered sleeping  0     Tired, decreased energy  1     Change in appetite  0     Feeling bad or failure about yourself   0     Trouble concentrating  0     Moving slowly or fidgety/restless  0     Suicidal thoughts  0     PHQ-9 Score  1     Difficult doing work/chores  Not difficult at all         Review of Systems  Musculoskeletal:  Positive for back pain and neck pain.        Wrist and hand pain   All other systems reviewed and are negative.      Objective:  Gen: no distress, normal appearing HEENT: oral mucosa pink and moist, NCAT Cardio: Reg rate Chest: normal effort, normal rate of breathing Abd: soft, non-distended Ext: no edema Psych: pleasant, normal affect Skin: intact Neuro: Alert and oriented x3 MSK: mild spasticity in right hand fingers. Sensation intact. Thumb flexion 2/5. Improving speech rate ant intelligibility. Improving right shoulder range of motion       Assessment & Plan:  Left Middle Cerebral Artery Stroke: Continue current medication regimen. Has a scheduled appointment with Neurology. She will begin outpatient therapy at [facility]. Continue to Monitor. Continue speech therapy to continue working on aphasia Discussed that she went to the stroke recovery meeting Referred to OT for vivstem referral Discussed dry needling of her right arm Left Ventricular Thrombus: Continue current medication regimen. Cardiology Following.  Essential Hypertension. Continue current medication regimen. PCP Following. Continue to Monitor. Continue Valsartan Right shoulder pain: Discussed that MRI shows complete tear, referred to ortho Discussed extracorporeal shockwave therapy as a modality for treatment. Discussed that the device looks and feels like a massage gun and I would move it over the area of pain for about 10 minutes. The device releases sound waves to the area of pain and helps to improve blood flow and circulation to improve the healing process. Discuss that this initially induces inflammation and can sometimes cause short-term increase in pain. Discussed  that we typically do three weekly treatments, but sometimes up to 6 if needed, and after 6 weeks long term benefits can sometimes be achieved.  Lyrica increased to 200mg  BID, discussed that she can continue this dose if she wants but given her unwanted weight gain we can  alternatively replace her morning dose with low dose naltrexone. She preferred to try this latter option. -discussed mechanism of action of low dose naltrexone as an opioid receptor antagonist which stimulates your body's production of its own natural endogenous opioids, helping to decrease pain. Discussed that it can also decrease T cell response and thus be helpful in decreasing inflammation, and symptoms of brain fog, fatigue, anxiety, depression, and allergies. Discussed that this medication needs to be compounded at a compounding pharmacy and can more expensive. Discussed that I usually start at 1mg  and if this is not providing enough relief then I titrate upward on a monthly basis.   Prescribed topamax to help improve sleep and pain, discussed that it can also curb appetite Encouraged follow-up with Dr. Everardo Pacific next week Discussed that she just received her right shoulder steroid injection  5. Overweight: -continue topamax 50mg  in the day and increase night time topamax to 75mg  -discussed her current diet -decrease Lyrica 100mg  daily  -Educated that current weight is 150 lbs, BMI 29.29 -Educated regarding health benefits of weight loss- for pain, general health, chronic disease prevention, immune health, mental health.  -Will monitor weight every visit.  -Consider Ro[place]ois tea daily.  -Discussed the benefits of intermittent fasting. -Discussed foods that can assist in weight loss: 1) leafy greens- high in fiber and nutrients 2) dark chocolate- improves metabolism (if prefer sweetened, best to sweeten with honey instead of sugar).  3) cruciferous vegetables- high in fiber and protein 4) full fat yogurt: high in healthy fat, protein, calcium, and probiotics 5) apples- high in a variety of phytochemicals 6) nuts- high in fiber and protein that increase feelings of fullness 7) grapefruit: rich in nutrients, antioxidants, and fiber (not to be taken with anticoagulation) 8) beans- high in  protein and fiber 9) salmon- has high quality protein and healthy fats 10) green tea- rich in polyphenols 11) eggs- rich in choline and vitamin D 12) tuna- high protein, boosts metabolism 13) avocado- decreases visceral abdominal fat 14) chicken (pasture raised): high in protein and iron 15) blueberries- reduce abdominal fat and cholesterol 16) whole grains- decreases calories retained during digestion, speeds metabolism 17) chia seeds- curb appetite 18) chilies- increases fat metabolism  -Discussed supplements that can be used:  1) Metatrim 400mg  BID 30 minutes before breakfast and dinner  2) Sphaeranthus indicus and Garcinia mangostana (combinations of these and #1 can be found in capsicum and zychrome  3) green coffee bean extract 400mg  twice per day or Irvingia (african mango) 150 to 300mg  twice per day.   5. Right hand weakness:  -continue OT -discussed vivistim -discussed e-stim and that she bought her own tens unit -discussed that she started working with a hand specialist  6) Aphasia: -discussed her progress with SLP -discussed that this is still a limitation in her returning to work -discussed that she is now returning to reading! She still gets stuck   7) UTI -prescribed macrobin 100mg  BID for 7 days -discussed UA/UC results -recommended staying well hydrated -recommended [place] for improving vaginal microbiome  8) Lumbar spinal stenosis: -discussed that we can increase topamax to 75mg  at night and continue the 50mg  during the day  9) Spider bites: -examined her  right arm -discussed that she is trying to avoid benadryl -discussed that itching has improved  40 minutes spent in review of chart, discussion of her her right hand weakness, that she saw a hand specialist, that she is trying dry needling for her right arm, discussed her excellent blood pressure control on her current regimen, discussed her excellent walking, discussed her current diet, discussed that she  had a steroid injection in her right shoulder, discussed that she was able to drive here, discussed that she is trying dry needling in her right arm, discussed that she has good insurance coverage for her therapy, discussed the topamax, discussed that she still remembers what she was taught in th hospital.

## 2022-10-15 ENCOUNTER — Encounter: Payer: Self-pay | Admitting: Rehabilitative and Restorative Service Providers"

## 2022-10-15 ENCOUNTER — Ambulatory Visit: Payer: Federal, State, Local not specified - PPO | Admitting: Rehabilitative and Restorative Service Providers"

## 2022-10-15 DIAGNOSIS — R482 Apraxia: Secondary | ICD-10-CM | POA: Diagnosis not present

## 2022-10-15 DIAGNOSIS — R471 Dysarthria and anarthria: Secondary | ICD-10-CM | POA: Diagnosis not present

## 2022-10-15 DIAGNOSIS — I69351 Hemiplegia and hemiparesis following cerebral infarction affecting right dominant side: Secondary | ICD-10-CM | POA: Diagnosis not present

## 2022-10-15 DIAGNOSIS — R41841 Cognitive communication deficit: Secondary | ICD-10-CM | POA: Diagnosis not present

## 2022-10-15 DIAGNOSIS — M6281 Muscle weakness (generalized): Secondary | ICD-10-CM

## 2022-10-15 DIAGNOSIS — R208 Other disturbances of skin sensation: Secondary | ICD-10-CM | POA: Diagnosis not present

## 2022-10-15 DIAGNOSIS — R278 Other lack of coordination: Secondary | ICD-10-CM | POA: Diagnosis not present

## 2022-10-15 DIAGNOSIS — R4701 Aphasia: Secondary | ICD-10-CM | POA: Diagnosis not present

## 2022-10-15 NOTE — Therapy (Signed)
OUTPATIENT OCCUPATIONAL THERAPY  Treatment & DISCHARGE Note   Patient Name: Chloe Johnson MRN: [id number] DOB:1955-06-02, 67 y.o., female Today's Date: 10/15/2022  PCP: Thana Ates, MD REFERRING PROVIDER: Horton Chin, MD    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/16/2013   L3-4/notes 11/24/2013, arms, neck   Exertional asthma    GERD (gastroesophageal reflux disease)    Heart attack (HCC) 01/18/2022   Hypertension    Kidney stones    "have always passed them"   Neck pain    Osteomyelitis (HCC) 11/23/2013   osteomyelitis, discitis    Sleep concern    uses Prozac for sleep    Stroke (HCC) 01/21/2022   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; left with aphasia, dysarthria, and apraxia   Past Surgical History:  Procedure Laterality Date   BREAST CYST EXCISION Left 2010   "polypectomy"   IR CT HEAD LTD  01/21/2022   IR CT HEAD LTD  01/28/2022   IR PERCUTANEOUS ART THROMBECTOMY/INFUSION INTRACRANIAL INC DIAG ANGIO  01/21/2022   IR PERCUTANEOUS ART THROMBECTOMY/INFUSION INTRACRANIAL INC DIAG ANGIO  01/28/2022   IR US GUIDE VASC ACCESS RIGHT  01/22/2022   PICC LINE PLACE PERIPHERAL ([facility] HX) Right    for use of Levaquin & Vancomycin, of note: she reports that she had a "flulike feeling"    RADIOLOGY WITH ANESTHESIA N/A 01/21/2022   Procedure: IR WITH ANESTHESIA;  Surgeon: Julieanne Cotton, MD;  Location: [facility] OR;  Service: Radiology;  Laterality: N/A;   RADIOLOGY WITH ANESTHESIA N/A 01/28/2022   Procedure: IR WITH ANESTHESIA;  Surgeon: Radiologist, Medication, MD;  Location: [facility] OR;  Service: Radiology;  Laterality: N/A;   TONSILLECTOMY AND ADENOIDECTOMY  1975   TUBAL LIGATION  1999   Patient Active Problem List   Diagnosis Date Noted   Aphasia 03/31/2022   Snores 03/31/2022   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   UTI (urinary tract infection) 02/04/2022   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   Stroke determined by  clinical assessment (HCC) 01/28/2022   Middle cerebral artery embolism, left 01/28/2022   H/O ischemic left MCA stroke    Acute respiratory failure (HCC)    CVA (cerebral vascular accident) (HCC) 01/21/2022   NSTEMI (non-ST elevated myocardial infarction) (HCC) 01/21/2022   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   HFrEF (heart failure with reduced ejection fraction) (HCC) 01/21/2022   Hyperlipidemia 01/21/2022   Lumbar stenosis 01/21/2022   Stroke (cerebrum) (HCC) 01/21/2022   S/P lumbar spinal fusion 11/15/2014   Lumbar discitis 11/29/2013   Discitis 11/24/2013   Diarrhea 11/17/2013   Osteomyelitis of lumbar spine (HCC) 11/16/2013   Hypertension    Asthma    Anxiety     ONSET DATE: 01/28/22  REFERRING DIAG: I63.9 (ICD-10-CM) - Cerebral infarction, unspecified   THERAPY DIAG:  Hemiplegia and hemiparesis following cerebral infarction affecting right dominant side (HCC)  Muscle weakness (generalized)  Other lack of coordination  Other disturbances of skin sensation  Rationale for Evaluation and Treatment Rehabilitation  PERTINENT HISTORY: PMH: HTN, cervical radiculopathy, hepatitis, osteomyelitis/discitis, lumbar stenosis with foot drop anxiety, GERD, insomnia  PRECAUTIONS: Fall;  WEIGHT BEARING RESTRICTIONS was awaiting spinal fusion surgery, limit bending, twisting, lifting >10#   SUBJECTIVE:   SUBJECTIVE STATEMENT: Pt states that she got a Rt sh steroid injection yesterday for pain. She thinks that dry needling helped her Rt arm after the last session.    PAIN:  Are you having pain?   No none today    PATIENT GOALS:  "I want my arm back. "   OBJECTIVE: (All objective assessments below are from initial evaluation on: 02/25/22 unless otherwise specified.)   HAND DOMINANCE: Right  ADLs:  10/15/22:  Ind or Mod I for all BADLs, and now Mod I for all IADLs as well with 1-handed techniques or min A from husband.    MOBILITY STATUS:  10/15/22: Independent    POSTURE COMMENTS:  No Significant postural limitations  ACTIVITY TOLERANCE: Activity tolerance: WNL for tasks assessed on eval  FUNCTIONAL OUTCOME MEASURES: 10/15/22: FOTO: 56  Eval: FOTO: 49  UPPER EXTREMITY ROM     Active ROM Right eval Left eval Rt 10/01/22 Rt 10/15/22  Shoulder flexion WNL WNL    Shoulder abduction      Shoulder adduction      Shoulder extension      Shoulder internal rotation   Approx 35 31  Shoulder external rotation   Approx 75* 77  Elbow flexion      Elbow extension -5 WNL    Wrist flexion 38 WNL Aprox 70 80  Wrist extension 50 WNL Approx 40 64  Wrist ulnar deviation      Wrist radial deviation      Wrist pronation      Wrist supination      (Blank rows = not tested)   UPPER EXTREMITY MMT:     MMT Right eval Rt 10/15/22  Shoulder flexion 4+/5 5/5 MMT  Shoulder abduction  5/5  Shoulder adduction    Shoulder extension    Shoulder internal rotation    Shoulder external rotation    Middle trapezius    Lower trapezius    Elbow flexion 4+/5 5/5 MMT  Elbow extension 4+/5 5/5 MMT  Wrist flexion  5/5 MMT  Wrist extension  5/5 MMT  (Blank rows = not tested)  HAND FUNCTION: Loose gross grasp, unable to squeeze dynamometer 06/06/22: R: 15#  COORDINATION: 10/15/22: BBT Right: 25 Blocks  (she did 28 blocks in early April)    Box and Blocks:  Right 10 blocks, Left 53 blocks Modified performance with RUE with therapist holding blocks in palm and pt able to complete gross grasp to pick up one at a time from OT palm of hand, minimal hand opening/closing 04/02/22: Right: 16 blocks  SENSATION: 10/15/22: Static 2-point discrimination: UNABLE in Rt hand; Semmes Weinstein Test:  Rt hand IF: 6.65, MF, 6.65, SF: 4.56, Thumb: 4.56  10/01/22: In right hand light touch is decreased especially in median nerve distribution and mildly in ulnar nerve distribution (this is also apparent with muscle testing of the median and ulnar innervated muscles).  Radial nerve  is not significantly affected today.   TODAY'S TREATMENT:  10/15/22: Today she performs strengthening versus therapist resistance and she is felt to have full strength 5 out of 5 from the shoulder to the wrist.  She performs new range of motion showing improvements at the wrist but none in the shoulder yet and was encouraged to keep doing shoulder HEP.  Additionally OT checks her sensation and she scores fairly poorly unable to identify 2 points on a 2 point discriminators.  She was cautioned to do skin checks now and then and avoid burning or cutting herself if possible, and she may not feel these things.  She states understanding.  Additionally OT reviews in hand manipulations with "pen tricks" which she is still unable to do today, but OT  encourages her to break down this activity and smaller steps and help solve the issue of why she cannot do it.  OT has a work on differentiated thumb motion and Human resources officer which her thumb works better.  OT then has her try to left fingers individually and differentiate finger motion versus gross flexor digitorum flexion and gross extensor digitorum extension.  She is able to do this somewhat and seems to be moving better with coaching and cueing.  She performs the box and blocks Test today and unfortunately does not score as well as she did several weeks ago.  She seems to have maximized her progress in therapy at this point and has tried all types of modalities as well as exercises and activities.  She is recommended to discharge today.   PATIENT EDUCATION: Education details: ongoing education with focus on coordination and progressive ROM Person educated: Patient Education method: Explanation, Demonstration, and Verbal cues Education comprehension: verbalized understanding and needs further education   HOME EXERCISE PROGRAM: Access Code: [email or web address] URL: [email or web address] Date: 10/01/2022 Prepared by: Fannie Knee  Access Code:  [email or web address] URL: [email or web address] Date: 08/04/2022 Prepared by: [facility] - [facility]   GOALS: Goals reviewed with patient? Yes   SHORT TERM GOALS: Target date: 10/10/22  1.  Pt will verbalize understanding of alternative treatment methods to aid in motor recovery and decrease of spasticity.  Baseline:  Goal status: 10/15/22: MET  2.  Pt and caregiver will demonstrate ability to apply kinesiotape to RUE appropriately to facilitate decreased edema and facilitate increased motor recovery.  Baseline:  Goal status: 10/15/22: MET   LONG TERM GOALS:  11/07/22  1.  Pt will demonstrate use of dominant RUE as gross assist to aid in managing clothing fasteners (tying shoe laces and buttons). Baseline:  Goal status: 10/15/22: Partially MET- d/c now  2.  Pt will increase RUE pinch strength by 3# to increase success with holding and manipulating writing and/or eating utensil.  Baseline: key pinch: 5#, 3 point pinch: 2#  Goal status: 10/15/22: Not met- d/c now  3.  Pt will be able to complete 9 hole peg test with RUE in 2 min time limit to demonstrate increased fine motor control as needed for ADLs/IADLs.  Baseline: 4-5 pegs in 2 min time limit  Goal status: 10/15/22: Not met - d/c now  4.  Pt will demonstrate increased functional reach, internal rotation, and grasp strength with RUE to tuck in shirt and aid in hygiene post toileting.    Baseline:   Goal status:  10/15/22: MET functionally though still struggling with IR AROM.    ASSESSMENT:  CLINICAL IMPRESSION: 10/15/22: Sensation is global issue and very poor, but AROM is improving at distal arm, strength is full and robust, coordination in her hand is the biggest/worst issue for her now.  She has a full exercise program and knows how to upgrade or downgrade for various challenges.  She knows that she needs to be consistent with her coordination training and that new activities like nerve gliding may  help.  She has maximized her progress from therapy at this time.  She was encouraged to "take a break" from therapy and reduce her stress levels and get into more fun leisure activities as possible.  She states understanding and agrees to discharge today.  She could return with a new order if she has a significant change in status in the future.    PLAN: OT FREQUENCY: 1-2x/week  OT DURATION:  8 weeks  PLANNED INTERVENTIONS: self care/ADL training, therapeutic exercise, therapeutic activity, neuromuscular re-education, manual therapy, passive range of motion, balance training, functional mobility training, splinting, electrical stimulation, ultrasound, compression bandaging, moist heat, cryotherapy, patient/family education, psychosocial skills training, energy conservation, coping strategies training, and DME and/or AE instructions  CONSULTED AND AGREED WITH PLAN OF CARE: Patient and family member/caregiver  PLAN FOR NEXT SESSION:  D/C today for max progress from therapy a this time    Fannie Knee, OTR/L 10/15/2022, 5:07 PM   OCCUPATIONAL THERAPY DISCHARGE SUMMARY  Visits from Start of Care: 44  Current functional level related to goals / functional outcomes: Pt has met some goals and maximized out potential for others at this point. She is pleased with overall progress.   Remaining deficits: Fine motor skills coordination is the biggest remaining deficit, others are much better or nonexistent now.  Education / Equipment: Pt has all needed materials and education. Pt understands how to continue on with self-management. See tx notes for more details.   Patient agrees to discharge due to max benefits received from outpatient occupational therapy / hand therapy at this time.   Fannie Knee, OTR/L, CHT 10/15/22

## 2022-10-17 ENCOUNTER — Ambulatory Visit: Payer: Federal, State, Local not specified - PPO | Attending: Cardiovascular Disease

## 2022-10-17 ENCOUNTER — Telehealth: Payer: Self-pay | Admitting: Pharmacist

## 2022-10-17 DIAGNOSIS — R0600 Dyspnea, unspecified: Secondary | ICD-10-CM

## 2022-10-17 DIAGNOSIS — Z79899 Other long term (current) drug therapy: Secondary | ICD-10-CM | POA: Diagnosis not present

## 2022-10-17 DIAGNOSIS — I5181 Takotsubo syndrome: Secondary | ICD-10-CM | POA: Diagnosis not present

## 2022-10-17 DIAGNOSIS — I5032 Chronic diastolic (congestive) heart failure: Secondary | ICD-10-CM | POA: Diagnosis not present

## 2022-10-17 NOTE — Telephone Encounter (Signed)
Pt with upcoming INR check on 5/30, can transition from warfarin to Eliquis at that time.  Would change to Eliquis 5mg  BID. Please set her up with $10 copay card at her appt - she has [employer] FEP plan.

## 2022-10-17 NOTE — Telephone Encounter (Signed)
-----   Message from Vesta Mixer, MD sent at 10/01/2022  5:42 PM EDT ----- Chloe Johnson  We will be switching Aleese to Eliquis . Will you help me with when to start Eliquis   PN    ----- Message ----- From: Josph Macho, MD Sent: 10/01/2022   5:25 PM EDT To: Vesta Mixer, MD  I see no problem with her switching over to Eliquis.  Thanks for all of your help.  Cindee Lame ----- Message ----- From: Vesta Mixer, MD Sent: 10/01/2022   5:16 PM EDT To: Josph Macho, MD  [name],  I saw [name] today for follow up visit Her LV function has returned to normal and her LV thrombus has resolved. 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 She has been on warfarin but would like to switch to Eliquis for simplicity  Would Eliquis be sufficient for her hypercoaguable condition or should she stay with warfarin .  Thanks Have a great week   Michele Mcalpine

## 2022-10-18 LAB — BASIC METABOLIC PANEL
BUN/Creatinine Ratio: 16 (ref 12–28)
BUN: 13 mg/dL (ref 8–27)
CO2: 19 mmol/L — ABNORMAL LOW (ref 20–29)
Calcium: 9.4 mg/dL (ref 8.7–10.3)
Chloride: 108 mmol/L — ABNORMAL HIGH (ref 96–106)
Creatinine, Ser: 0.81 mg/dL (ref 0.57–1.00)
Glucose: 110 mg/dL — ABNORMAL HIGH (ref 70–99)
Potassium: 3.9 mmol/L (ref 3.5–5.2)
Sodium: 141 mmol/L (ref 134–144)
eGFR: 81 mL/min/{1.73_m2} (ref >59)

## 2022-10-22 ENCOUNTER — Ambulatory Visit: Payer: Federal, State, Local not specified - PPO | Admitting: Occupational Therapy

## 2022-10-23 ENCOUNTER — Ambulatory Visit: Payer: Federal, State, Local not specified - PPO | Attending: Cardiology

## 2022-10-23 ENCOUNTER — Ambulatory Visit: Payer: Medicare Other

## 2022-10-23 DIAGNOSIS — I639 Cerebral infarction, unspecified: Secondary | ICD-10-CM

## 2022-10-23 DIAGNOSIS — Z5181 Encounter for therapeutic drug level monitoring: Secondary | ICD-10-CM

## 2022-10-23 DIAGNOSIS — I513 Intracardiac thrombosis, not elsewhere classified: Secondary | ICD-10-CM | POA: Diagnosis not present

## 2022-10-23 LAB — POCT INR: INR: 3.6 — AB (ref 2.0–3.0)

## 2022-10-23 MED ORDER — APIXABAN 5 MG PO TABS: 5.0000 mg | ORAL_TABLET | Freq: Two times a day (BID) | ORAL | 5 refills | Status: DC

## 2022-10-23 NOTE — Patient Instructions (Signed)
Stop Coumadin today and Start Eliquis Friday (tomorrow) 5/31

## 2022-10-29 ENCOUNTER — Encounter: Payer: Medicare Other | Admitting: Occupational Therapy

## 2022-11-03 DIAGNOSIS — M6289 Other specified disorders of muscle: Secondary | ICD-10-CM | POA: Diagnosis not present

## 2022-11-03 DIAGNOSIS — N3946 Mixed incontinence: Secondary | ICD-10-CM | POA: Diagnosis not present

## 2022-11-03 DIAGNOSIS — M62838 Other muscle spasm: Secondary | ICD-10-CM | POA: Diagnosis not present

## 2022-11-03 DIAGNOSIS — M6281 Muscle weakness (generalized): Secondary | ICD-10-CM | POA: Diagnosis not present

## 2022-11-04 ENCOUNTER — Ambulatory Visit: Payer: Federal, State, Local not specified - PPO | Admitting: Occupational Therapy

## 2022-11-18 DIAGNOSIS — N390 Urinary tract infection, site not specified: Secondary | ICD-10-CM | POA: Diagnosis not present

## 2022-11-20 DIAGNOSIS — M62838 Other muscle spasm: Secondary | ICD-10-CM | POA: Diagnosis not present

## 2022-11-20 DIAGNOSIS — M6289 Other specified disorders of muscle: Secondary | ICD-10-CM | POA: Diagnosis not present

## 2022-11-20 DIAGNOSIS — N3946 Mixed incontinence: Secondary | ICD-10-CM | POA: Diagnosis not present

## 2022-11-20 DIAGNOSIS — M6281 Muscle weakness (generalized): Secondary | ICD-10-CM | POA: Diagnosis not present

## 2022-12-05 ENCOUNTER — Other Ambulatory Visit: Payer: Self-pay | Admitting: Physical Medicine and Rehabilitation

## 2022-12-08 ENCOUNTER — Encounter: Payer: Self-pay | Admitting: Physical Medicine and Rehabilitation

## 2022-12-08 ENCOUNTER — Encounter
Payer: Federal, State, Local not specified - PPO | Attending: Registered Nurse | Admitting: Physical Medicine and Rehabilitation

## 2022-12-08 VITALS — BP 127/82 | HR 63 | Ht 60.0 in | Wt 139.0 lb

## 2022-12-08 DIAGNOSIS — M48062 Spinal stenosis, lumbar region with neurogenic claudication: Secondary | ICD-10-CM | POA: Diagnosis not present

## 2022-12-08 DIAGNOSIS — R4701 Aphasia: Secondary | ICD-10-CM | POA: Diagnosis not present

## 2022-12-08 DIAGNOSIS — R29898 Other symptoms and signs involving the musculoskeletal system: Secondary | ICD-10-CM | POA: Diagnosis not present

## 2022-12-08 DIAGNOSIS — I4891 Unspecified atrial fibrillation: Secondary | ICD-10-CM | POA: Insufficient documentation

## 2022-12-08 NOTE — Progress Notes (Signed)
Subjective:    Patient ID: Chloe Johnson, female    DOB: 1955/09/16, 67 y.o.   MRN: [id number]  HPI:   Chloe Johnson is a 67 y.o. female who is here for f/u of right shoulder pain, Left Middle Cerebral Artery Stroke, Left Ventricular Thrombus and Essential Hypertension.   Dr. Iver Nestle H&P on 01/28/2022 HPI: Chloe Johnson is a 67 y.o. 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 Amada Jupiter, with key details confirmed by husband Chloe Johnson  is a 66 y.o.  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 She presents today after the following series events.  Around 12 PM this past Saturday, the patient noted sudden onset dizziness described as "vertigo" followed by nausea then followed by an episode of vomiting.  She next felt a sudden onset sharp chest pain with radiation to her back.  She assumed it was her cervical radiculopathy "acting up" because shortly after this event, she was holding 2 plates in her hand to feed her cats when she suddenly had no control of both upper extremities and dropped said plates.  On Sunday, she felt generally weak but then improved from all of this until this morning, around 9 AM, when she experienced word finding difficulties.  Her husband, who is at bedside, also notes she had a right facial droop transiently that lasted no more than a few minutes.  She may have had 2 subsequent episodes, as documented by ED  physician; however, all of her is told by the patient's husband that this was 1 single episode."   Her hospital course was noted for acute worsening for which she underwent thrombectomy and was discharged with only some very mild residual aphasia not detectable on examination but noticed by the patient per husband.  He notes that she has been in her normal state of health since discharge other than a visit to the ED on 9/3 with left shoulder pain which was attributed to her known cervical stenosis for which she is planned for surgery in the near future   Husband reports that she was taking his blood pressure when suddenly the blood pressure monitor fell off the table.  She tried to catch it but lost her balance had weakness in both of her legs.  Initially she got to both both arms but then became weak on the right side.  She was unable to speak at all suddenly during this event.  Work-up revealed a left ventricle apical thrombus.   Has been reports she has not missed any of her doses of medication including Eliquis   CTA was attempted to confirm recurrent acute occlusion, however due to IV infiltration and severity of her symptoms decision was made to proceed directly to IR which was discussed both with [name] as well as with the patient's husband   MR Brain:  IMPRESSION: 1. 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 2. No intracranial large vessel occlusion or significant stenosis. In particular, left MCA branches appear perfused and without focal stenosis.  MRA:  IMPRESSION:  1. 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 2. No intracranial large vessel occlusion or significant stenosis. In particular, left MCA branches appear perfused and without focal stenosis.  [name] COURSE Stroke:  left MCA punctate scattered infarct with left M2 occlusion s/p IR with TICI2c, embolic secondary to LV thrombus  recently on Eliquis CT no acute abnormality IR left superior M2 occlusion, inferior M3/M4 nonocclusive thrombus Status post EVT with TICI2c MRI left insular cortex and frontal lobe punctate infarcts MRA unremarkable 2D Echo EF 45 to 50% on 8/29, LV regional motion abnormality with LV thrombus LDL 150 on 8/29 HgbA1c 5 0 on 8/29 UDS positive for THC Hypercoagulable and autoimmune work up negative Heparin IV for VTE prophylaxis aspirin 81 mg daily and Eliquis (apixaban) daily prior to admission,  now on coumadin with lovenox bridge. INR goal 2.5 Continue ASA 81 per cardiology. Ongoing aggressive stroke risk factor management Therapy recommendations:  CIR Disposition:  pending   Hx of stroke and LV thrombus 8/29 admitted for nausea vomiting dizziness chest pain and then developed right facial droop and aphasia.  Found to have troponin > 300, EF 45 to 50% with LV thrombus.  CTA coronary artery showed minimal CAD.  CT no acute abnormality.  CTA head and neck showed left M2 occlusion.  CTP 0/7.  Status post EVT with TICI2c.  MRI showed left frontal Herbers matter punctate infarct.  Repeat MRI showed left external capsule, insular cortex and frontal cortex punctate infarcts.  LDL 150, A1c 5.0.  UDS positive for THC.  Patient was put on heparin IV and aspirin and eventually discharged on Eliquis and aspirin as well as Crestor 20. Per family, patient has slight expressive aphasia since discharge, no physical deficit. Cardiology [name] on board, recommend coumadin with INR goal 2.5. on lovenox bridge.    [name] was admitted to inpatient rehabilitation on 02/04/2022 and discharged home on 02/07/2022. She will begin outpatient Therapy on 02/25/2022 at [facility].  [name] has expressive aphasia.  She was positive with THC, she reports she uses CBD products for anxiety, this was discussed with [name]. [name] stated she can continue with her CBD product, she  verbalizes understanding.   1) Right shoulder pain  -pain has been severe -she was not sure if she wanted to increase the topamax -she has been waking up and doing more since she went down on the lyrica -started suddenly when she was doing an activity (history is limited by her aphasia) -she feels this pain is more than just spasticity -it is very severe -she has been doing better with the increase in Lyrica to 200mg  BID -XR discussed and she would like to get this -she prefers nonsurgical treatment if possible.  -she discussed with Dr. Everardo Pacific and surgery was not recommended due to risk of re-tear -she is scheduled for steroid injection -she would be interested in starting topamax which we discussed before -pain has returned but she sees Dr. Everardo Pacific next week and she is going to ask for another injection -she thinks her shoulder is healing -positioning in car was causing the 10/10 of pain  2) Aphasia is greatly improving and she continues working with therapy -she is speaking so well today -long term disability was improved -not frustrated with speech but it is seems like it it shouldn't be this hard -she is still struggling -notes word finding difficuties -sometimes she feels frustrated as she can't find the  thought she wants.  -she is going to be discharged from therapy on may 8th -she feels that she continues to improve every day -she slows down when she sees confusion on someone's face -does not feel she can return to work yet   3) UTI: -pyridium helped with her UTI symptoms  4) CHF -Entresto has improved  5) HTN -BP has improved -she is taking the spironolactone and valsartan  6) Atrial fibrillation: -she is so happy to be transitioned to Eliquis and is happy she has no dietary restrictions  7) Overweight: -her diety has improved and weight has improved    Pain Inventory Average Pain 4 Pain Right Now 4 My pain is constant and aching  LOCATION OF PAIN   Neck,Wrist,Hand,Back  BOWEL Number of stools per week: 14  Type of laxative Super Cleanse  BLADDER Normal    Mobility walk without assistance how many minutes can you walk? As needed ability to climb steps?  yes do you drive?  no Do you have any goals in this area?  yes  Function employed # of hrs/week 70  Neuro/Psych bowel control problems weakness numbness anxiety  Prior Studies Any changes since last visit?  no  Physicians involved in your care Any changes since last visit?  no   Family History  Problem Relation Age of Onset   Other Mother    Other Father    Hypertension Father    Cancer Sister    Sleep apnea Sister    Social History   Socioeconomic History   Marital status: Married    Spouse name: Leonette Most   Number of children: 3   Years of education: RN   Highest education level: Not on file  Occupational History    Employer: [employer]   Tobacco Use   Smoking status: Never    Passive exposure: Past   Smokeless tobacco: Never  Vaping Use   Vaping status: Never Used  Substance and Sexual Activity   Alcohol use: Yes    Alcohol/week: 2.0 standard drinks of alcohol    Types: 2 Shots of liquor per week    Comment: "a little"; 1/2 a shot x 3 per week   Drug use: No    Comment: takes CBD gummies for sleep and anxiety   Sexual activity: Yes    Birth control/protection: Post-menopausal  Other Topics Concern   Not on file  Social History Narrative   Patient is married and lives at home with her husband Leonette Most).  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 Right handed.College education.         Social Determinants of Health   Financial Resource Strain: Not on file  Food Insecurity: Not on file  Transportation Needs: Not on file  Physical Activity: Not on file  Stress: Not on file  Social Connections: Not on file   Past Surgical History:   Procedure Laterality Date   BREAST CYST EXCISION Left 2010   "polypectomy"   IR CT HEAD LTD  01/21/2022   IR CT HEAD LTD  01/28/2022   IR PERCUTANEOUS ART THROMBECTOMY/INFUSION INTRACRANIAL INC DIAG ANGIO  01/21/2022   IR PERCUTANEOUS ART THROMBECTOMY/INFUSION INTRACRANIAL INC DIAG ANGIO  01/28/2022   IR US GUIDE VASC ACCESS RIGHT  01/22/2022   PICC LINE PLACE PERIPHERAL ([facility] HX) Right    for use of Levaquin & Vancomycin, of note: she reports that she  had a "flulike feeling"    RADIOLOGY WITH ANESTHESIA N/A 01/21/2022   Procedure: IR WITH ANESTHESIA;  Surgeon: Julieanne Cotton, MD;  Location: [facility] OR;  Service: Radiology;  Laterality: N/A;   RADIOLOGY WITH ANESTHESIA N/A 01/28/2022   Procedure: IR WITH ANESTHESIA;  Surgeon: Radiologist, Medication, MD;  Location: [facility] OR;  Service: Radiology;  Laterality: N/A;   TONSILLECTOMY AND ADENOIDECTOMY  1975   TUBAL LIGATION  1999   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/16/2013   L3-4/notes 11/24/2013, arms, neck   Exertional asthma    GERD (gastroesophageal reflux disease)    Heart attack (HCC) 01/18/2022   Hypertension    Kidney stones    "have always passed them"   Neck pain    Osteomyelitis (HCC) 11/23/2013   osteomyelitis, discitis    Sleep concern    uses Prozac for sleep    Stroke (HCC) 01/21/2022   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; left with aphasia, dysarthria, and apraxia   There were no vitals taken for this visit.  Opioid Risk Score:   Fall Risk Score:  `1  Depression screen [facility] 2/9     07/11/2022   10:19 AM 02/20/2022    9:22 AM 02/15/2014   11:06 AM 01/18/2014    2:15 PM 12/23/2013   10:14 AM  Depression screen PHQ 2/9  Decreased Interest 0 0 0 0 0  Down, Depressed, Hopeless 0 0 1 0 0  PHQ - 2 Score 0 0 1 0 0  Altered sleeping  0     Tired, decreased energy  1     Change in appetite  0     Feeling bad or failure about yourself   0     Trouble concentrating  0      Moving slowly or fidgety/restless  0     Suicidal thoughts  0     PHQ-9 Score  1     Difficult doing work/chores  Not difficult at all         Review of Systems  Musculoskeletal:  Positive for back pain and neck pain.       Wrist and hand pain   All other systems reviewed and are negative.      Objective:  Gen: no distress, normal appearing HEENT: oral mucosa pink and moist, NCAT Cardio: Reg rate Chest: normal effort, normal rate of breathing Abd: soft, non-distended Ext: no edema Psych: pleasant, normal affect Skin: intact MSK: mild spasticity in right hand fingers. Sensation intact. Thumb flexion 2/5. Improving speech rate ant intelligibility. Improving right shoulder range of motio     Assessment & Plan:  Left Middle Cerebral Artery Stroke: Continue current medication regimen. Has a scheduled appointment with Neurology. She will begin outpatient therapy at [facility]. Continue to Monitor. Continue speech therapy to continue working on aphasia Discussed that she went to the stroke recovery meeting Referred to OT for vivstem referral Discussed dry needling of her right arm Left Ventricular Thrombus: Continue current medication regimen. Cardiology Following.  Essential Hypertension. Continue current medication regimen. PCP Following. Continue to Monitor. Continue Valsartan Right shoulder pain: Discussed that MRI shows complete tear, referred to ortho Discussed extracorporeal shockwave therapy as a modality for treatment. Discussed that the device looks and feels like a massage gun and I would move it over the area of pain for about 10 minutes. The device releases sound waves to the area  of pain and helps to improve blood flow and circulation to improve the healing process. Discuss that this initially induces inflammation and can sometimes cause short-term increase in pain. Discussed that we typically do three weekly treatments, but sometimes up to 6 if needed,  and after 6 weeks long term benefits can sometimes be achieved.  Lyrica increased to 200mg  BID, discussed that she can continue this dose if she wants but given her unwanted weight gain we can alternatively replace her morning dose with low dose naltrexone. She preferred to try this latter option. -discussed mechanism of action of low dose naltrexone as an opioid receptor antagonist which stimulates your body's production of its own natural endogenous opioids, helping to decrease pain. Discussed that it can also decrease T cell response and thus be helpful in decreasing inflammation, and symptoms of brain fog, fatigue, anxiety, depression, and allergies. Discussed that this medication needs to be compounded at a compounding pharmacy and can more expensive. Discussed that I usually start at 1mg  and if this is not providing enough relief then I titrate upward on a monthly basis.   Prescribed topamax to help improve sleep and pain, discussed that it can also curb appetite Encouraged follow-up with Dr. Everardo Pacific next week Discussed that she just received her right shoulder steroid injection  5. Overweight: -continue topamax 50mg  in the day and increase night time topamax to 75mg  -discussed her current diet -decrease Lyrica 100mg  daily  -Educated that current weight is 150 lbs, BMI 29.29 -Educated regarding health benefits of weight loss- for pain, general health, chronic disease prevention, immune health, mental health.  -Will monitor weight every visit.  -Consider Roobois tea daily.  -Discussed the benefits of intermittent fasting. -Discussed foods that can assist in weight loss: 1) leafy greens- high in fiber and nutrients 2) dark chocolate- improves metabolism (if prefer sweetened, best to sweeten with honey instead of sugar).  3) cruciferous vegetables- high in fiber and protein 4) full fat yogurt: high in healthy fat, protein, calcium, and probiotics 5) apples- high in a variety of  phytochemicals 6) nuts- high in fiber and protein that increase feelings of fullness 7) grapefruit: rich in nutrients, antioxidants, and fiber (not to be taken with anticoagulation) 8) beans- high in protein and fiber 9) salmon- has high quality protein and healthy fats 10) green tea- rich in polyphenols 11) eggs- rich in choline and vitamin D 12) tuna- high protein, boosts metabolism 13) avocado- decreases visceral abdominal fat 14) chicken (pasture raised): high in protein and iron 15) blueberries- reduce abdominal fat and cholesterol 16) whole grains- decreases calories retained during digestion, speeds metabolism 17) chia seeds- curb appetite 18) chilies- increases fat metabolism  -Discussed supplements that can be used:  1) Metatrim 400mg  BID 30 minutes before breakfast and dinner  2) Sphaeranthus indicus and Garcinia mangostana (combinations of these and #1 can be found in capsicum and zychrome  3) green coffee bean extract 400mg  twice per day or Irvingia (african mango) 150 to 300mg  twice per day.   5. Right hand weakness:  -continue OT -discussed vivistim -discussed e-stim and that she bought her own tens unit -discussed that she started working with a hand specialist -discussed her interest in hand anatomy -provided video to learn how the MCA stroke affects hand strength.   6) Aphasia: -discussed her progress with SLP -discussed that this is still a limitation in her returning to work -discussed that she is now returning to reading! She still gets stuck   7) UTI -  prescribed macrobin 100mg  BID for 7 days -discussed UA/UC results -recommended staying well hydrated -recommended U[place] for improving vaginal microbiome  8) Lumbar spinal stenosis: -discussed that we can increase topamax to 75mg  at night and continue the 50mg  during the day, continue this regimen  9) Spider bites: -examined her right arm -discussed that she is trying to avoid benadryl -discussed that  itching has improved  10) atrial fibrillation: -continue Eliquis -discussed that she is happy that she does not have dietary restrictions

## 2022-12-08 NOTE — Patient Instructions (Signed)
[email or web address]

## 2022-12-20 ENCOUNTER — Encounter: Payer: Self-pay | Admitting: Cardiovascular Disease

## 2022-12-20 NOTE — Progress Notes (Unsigned)
Cardiology Office Note:    Date:  12/22/2022   ID:  Chloe Johnson, DOB 1955-11-22, MRN [id number]  PCP:  Thana Ates, MD   [facility] Providers Cardiologist: Argus Caraher    Referring MD: Thana Ates, MD   Chief Complaint  Patient presents with   Cerebrovascular Accident     History of Present Illness:    Chloe Johnson is a 67 y.o. female with a hx of  ? 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  She was thought to have had an eliquis failure and  She has now been transitioned to coumadin /Lovenox. I actually think the issues was that she just had not been on any anticoagulant long enough to start dissolving the thrombus   Has an expressive aphasia  Improving with rehab. Improving daily   INR followed at [facility]  Last INR is 1.3   Oct. 16, 2023 Chloe Johnson is seen for follow up of her CHF, LV thrombus and stroke Echo several days ago shows normal LV function without mention of segmental wall motion abn. Mild MR, trivial TR  Mild PI   Her last INR is 4.3 on Oct. 12, 2023  Managed by our coumadin clinic   Oct 01, 2022 Chloe Johnson is seen for follow up of her CHF, LV thrombus, and stroke  She wants to stop warfarin and start Eliquis  She has seen Dr. Myna Hidalgo and see if Eliquis will be adequate for her factor II abnormalty   She thinks her entresto is causing her hypokalemia and she is not able too tolerate the Kdur ( stomach issues Wants to try valsartan instead   Also would like to stop her toprol XL ( since her LV function has improved ) I would like to continue the Toprol XL for now   December 22, 2022  Chloe Johnson is seen for follow up of her Takotsubo syndrome,  CHF, LV thrombus, stroke She originally presented with neuro/ stroke symptoms   We changed her from warfarin to Eliquis at her last visit  We  ordered PFTs for further evaluation of her dyspnea ,  she did not get a  Is doing hyperbaric oxygen therapy   Still has not received the newest pneumonia vaccine     Husband Chloe Johnson developed extensive DVT and PE Memorial day weekend .  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  Is not going to return to work . Has long term disability       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/16/2013   L3-4/notes 11/24/2013, arms, neck   Exertional asthma    GERD (gastroesophageal reflux disease)    Heart attack (HCC) 01/18/2022   Hypertension    Kidney stones    "have always passed them"   Neck pain    Osteomyelitis (HCC) 11/23/2013   osteomyelitis, discitis    Sleep concern    uses Prozac for sleep    Stroke (HCC) 01/21/2022   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; left with aphasia, dysarthria, and apraxia    Past Surgical History:  Procedure Laterality Date   BREAST CYST EXCISION Left 2010   "polypectomy"   IR CT HEAD LTD  01/21/2022   IR CT HEAD LTD  01/28/2022   IR PERCUTANEOUS ART  THROMBECTOMY/INFUSION INTRACRANIAL INC DIAG ANGIO  01/21/2022   IR PERCUTANEOUS ART THROMBECTOMY/INFUSION INTRACRANIAL INC DIAG ANGIO  01/28/2022   IR US GUIDE VASC ACCESS RIGHT  01/22/2022   PICC LINE PLACE PERIPHERAL ([facility] HX) Right    for use of Levaquin & Vancomycin, of note: she reports that she had a "flulike feeling"    RADIOLOGY WITH ANESTHESIA N/A 01/21/2022   Procedure: IR WITH ANESTHESIA;  Surgeon: Julieanne Cotton, MD;  Location: [facility] OR;  Service: Radiology;  Laterality: N/A;   RADIOLOGY WITH ANESTHESIA N/A 01/28/2022   Procedure: IR WITH ANESTHESIA;  Surgeon: Radiologist, Medication, MD;  Location: [facility] OR;  Service: Radiology;  Laterality: N/A;   TONSILLECTOMY AND ADENOIDECTOMY  1975   TUBAL LIGATION  1999    Current Medications: Current Meds  Medication Sig   acetaminophen (TYLENOL) 325 MG tablet Take 2 tablets (650 mg total) by mouth  every 4 (four) hours as needed for mild pain (or temp > 37.5 C (99.5 F)). (Patient taking differently: Take 650 mg by mouth 2 (two) times daily.)   apixaban (ELIQUIS) 5 MG TABS tablet Take 1 tablet (5 mg total) by mouth 2 (two) times daily.   cyclobenzaprine (FLEXERIL) 10 MG tablet TAKE ONE TABLET AT BEDTIME   famotidine (PEPCID) 20 MG tablet Take 1 tablet (20 mg total) by mouth 2 (two) times daily.   IMVEXXY MAINTENANCE PACK 10 MCG INST Place vaginally 2 (two) times a week.   loratadine (CLARITIN) 10 MG tablet Take 1 tablet (10 mg total) by mouth daily.   metoprolol succinate (TOPROL-XL) 25 MG 24 hr tablet Take 0.5 tablets (12.5 mg total) by mouth daily.   mirabegron ER (MYRBETRIQ) 50 MG TB24 tablet Take 25 mg by mouth daily.   Naltrexone HCl, Pain, 1.5 MG CAPS Take 1 mg by mouth daily at 6 (six) AM.   potassium chloride (KLOR-CON) 10 MEQ tablet Take 1 tablet (10 mEq total) by mouth 2 (two) times daily.   pregabalin (LYRICA) 100 MG capsule Take 1 capsule (100 mg total) by mouth daily.   pregabalin (LYRICA) 150 MG capsule Take 1 capsule (150 mg total) by mouth at bedtime.   rosuvastatin (CRESTOR) 20 MG tablet Take 1 tablet (20 mg total) by mouth daily.   spironolactone (ALDACTONE) 25 MG tablet Take 0.5 tablets (12.5 mg total) by mouth daily.   topiramate (TOPAMAX) 50 MG tablet Take 1 tablet (50 mg total) by mouth 2 (two) times daily.   valsartan (DIOVAN) 80 MG tablet Take 1 tablet (80 mg total) by mouth daily.     Allergies:   Doxycycline, Erythromycin, Iodine, Other, Vancomycin, Vicodin [hydrocodone-acetaminophen], Dilaudid [hydromorphone hcl], Keflex [cephalexin], Penicillins, and Sulfa antibiotics   Social History   Socioeconomic History   Marital status: Married    Spouse name: Charles   Number of children: 3   Years of education: RN   Highest education level: Not on file  Occupational History    Employer: [employer]   Tobacco Use   Smoking status: Never    Passive  exposure: Past   Smokeless tobacco: Never  Vaping Use   Vaping status: Never Used  Substance and Sexual Activity   Alcohol use: Yes    Alcohol/week: 2.0 standard drinks of alcohol    Types: 2 Shots of liquor per week    Comment: "a little"; 1/2 a shot x 3 per week   Drug use: No    Comment: takes CBD gummies for sleep and anxiety   Sexual activity: Yes  Birth control/protection: Post-menopausal  Other Topics Concern   Not on file  Social History Narrative   Patient is married and lives at home with her husband Leonette Most).  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 Right handed.College education.         Social Determinants of Health   Financial Resource Strain: Not on file  Food Insecurity: Not on file  Transportation Needs: Not on file  Physical Activity: Not on file  Stress: Not on file  Social Connections: Not on file     Family History: The patient's family history includes Cancer in her sister; Hypertension in her father; Other in her father and mother; Sleep apnea in her sister.  ROS:   Please see the history of present illness.     All other systems reviewed and are negative.  EKGs/Labs/Other Studies Reviewed:    The following studies were reviewed today:   EKG:      Recent Labs: 01/30/2022: TSH 1.712 02/03/2022: Magnesium 2.1 05/05/2022: ALT 13; Hemoglobin 13.2; Platelet Count 197 10/17/2022: BUN 13; Creatinine, Ser 0.81; Potassium 3.9; Sodium 141  Recent Lipid Panel    Component Value Date/Time   CHOL 223 (H) 01/22/2022 0351   TRIG 271 (H) 01/29/2022 0300   HDL 40 (L) 01/22/2022 0351   CHOLHDL 5.6 01/22/2022 0351   VLDL 33 01/22/2022 0351   LDLCALC 150 (H) 01/22/2022 0351     Risk Assessment/Calculations:       Physical Exam:     Physical Exam: Blood pressure 106/74, pulse 64, height 5' (1.524 m), weight 139 lb 12.8 oz (63.4 kg), SpO2 97%.        GEN:  Well nourished, well developed in no acute distress HEENT: Normal NECK: No JVD; No carotid bruits LYMPHATICS: No lymphadenopathy CARDIAC: RRR , no murmurs, rubs, gallops RESPIRATORY:  Clear to auscultation without rales, wheezing or rhonchi  ABDOMEN: Soft, non-tender, non-distended MUSCULOSKELETAL:  No edema; No deformity  SKIN: Warm and dry NEUROLOGIC:  Alert and oriented x 3    ASSESSMENT:    1. Takotsubo cardiomyopathy   2. HFrEF (heart failure with reduced ejection fraction) (HCC)   3. Dyspnea, unspecified type       PLAN:        Chronic systolic CHF :  presented with Takotsubo syndrome, LV thrombus and a stroke .    her LV fnction has normal normalized.   No thrombus on echo   She has been on Toprol XL 12.5 mg a day  Has lots of fatigue Will DC toprol and try low dose Coreg to see if this helps the fatigue If she continues to have fatigue, will DC coreg and see how she does     2. LV thrombus:     Resolved.  Continue Eliquis      3.  Stroke:    per neuro. Improving slowly   .4.  Dyspnea:   she remains short of breath.  Cannot blow up a balloon. Will refer to pulmonary                  Medication Adjustments/Labs and Tests Ordered: Current medicines are reviewed at length with the patient today.  Concerns regarding medicines are outlined above.  No orders of the defined types were placed in this encounter.  No orders of the defined types were placed in this encounter.   Patient Instructions  Medication Instructions:  STOP Metoprolol Succinate /Toprol XL START Carvedilol/ Coreg 3.125mg  twice daily *If you need a refill on your cardiac medications before your next appointment, please call your pharmacy*   Lab Work: NONE If you have labs (blood work) drawn today and your tests are completely normal, you will receive your results only by: MyChart Message (if you have MyChart) OR A paper copy in the mail If you have any lab test  that is abnormal or we need to change your treatment, we will call you to review the results.   Testing/Procedures: Ambulatory referral to pulmonology (dyspnea)   Follow-Up: At [facility], you and your health needs are our priority.  As part of our continuing mission to provide you with exceptional heart care, we have created designated Provider Care Teams.  These Care Teams include your primary Cardiologist (physician) and Advanced Practice Providers (APPs -  Physician Assistants and Nurse Practitioners) who all work together to provide you with the care you need, when you need it.  Your next appointment:   3 month(s)  Provider:   Kristeen Miss, MD        Signed, Kristeen Miss, MD  12/22/2022 12:06 PM    [facility]

## 2022-12-22 ENCOUNTER — Encounter: Payer: Self-pay | Admitting: Cardiovascular Disease

## 2022-12-22 ENCOUNTER — Ambulatory Visit: Payer: Federal, State, Local not specified - PPO | Attending: Cardiovascular Disease | Admitting: Cardiovascular Disease

## 2022-12-22 VITALS — BP 106/74 | HR 64 | Ht 60.0 in | Wt 139.8 lb

## 2022-12-22 DIAGNOSIS — R0600 Dyspnea, unspecified: Secondary | ICD-10-CM

## 2022-12-22 DIAGNOSIS — I5181 Takotsubo syndrome: Secondary | ICD-10-CM | POA: Diagnosis not present

## 2022-12-22 DIAGNOSIS — I5020 Unspecified systolic (congestive) heart failure: Secondary | ICD-10-CM | POA: Diagnosis not present

## 2022-12-22 MED ORDER — CARVEDILOL 3.125 MG PO TABS
3.1250 mg | ORAL_TABLET | Freq: Two times a day (BID) | ORAL | 3 refills | Status: DC
Start: 2022-12-22 — End: 2024-02-22

## 2022-12-22 NOTE — Patient Instructions (Signed)
Medication Instructions:  STOP Metoprolol Succinate /Toprol XL START Carvedilol/ Coreg 3.125mg  twice daily *If you need a refill on your cardiac medications before your next appointment, please call your pharmacy*   Lab Work: NONE If you have labs (blood work) drawn today and your tests are completely normal, you will receive your results only by: MyChart Message (if you have MyChart) OR A paper copy in the mail If you have any lab test that is abnormal or we need to change your treatment, we will call you to review the results.   Testing/Procedures: Ambulatory referral to pulmonology (dyspnea)   Follow-Up: At [facility], you and your health needs are our priority.  As part of our continuing mission to provide you with exceptional heart care, we have created designated Provider Care Teams.  These Care Teams include your primary Cardiologist (physician) and Advanced Practice Providers (APPs -  Physician Assistants and Nurse Practitioners) who all work together to provide you with the care you need, when you need it.  Your next appointment:   3 month(s)  Provider:   Kristeen Miss, MD

## 2022-12-31 DIAGNOSIS — M62838 Other muscle spasm: Secondary | ICD-10-CM | POA: Diagnosis not present

## 2022-12-31 DIAGNOSIS — M6289 Other specified disorders of muscle: Secondary | ICD-10-CM | POA: Diagnosis not present

## 2022-12-31 DIAGNOSIS — N3946 Mixed incontinence: Secondary | ICD-10-CM | POA: Diagnosis not present

## 2022-12-31 DIAGNOSIS — M6281 Muscle weakness (generalized): Secondary | ICD-10-CM | POA: Diagnosis not present

## 2023-01-02 ENCOUNTER — Ambulatory Visit (INDEPENDENT_AMBULATORY_CARE_PROVIDER_SITE_OTHER): Payer: Federal, State, Local not specified - PPO | Admitting: Pulmonary Disease

## 2023-01-02 ENCOUNTER — Encounter: Payer: Self-pay | Admitting: Pulmonary Disease

## 2023-01-02 VITALS — BP 108/68 | HR 66 | Ht 60.0 in | Wt 138.8 lb

## 2023-01-02 DIAGNOSIS — R0600 Dyspnea, unspecified: Secondary | ICD-10-CM | POA: Diagnosis not present

## 2023-01-02 NOTE — Patient Instructions (Signed)
Schedule for pulmonary function test with inspiratory and expiratory pressures  Consider inspiratory muscle training as -An example is the power lung that we reviewed online  Continue to stay active as best as possible  We may consider referral to ENT for evaluation of the vocal cords  Follow-up in about 6 to 8 weeks

## 2023-01-02 NOTE — Progress Notes (Signed)
Chloe Johnson    [id number]    06/17/1955  Primary Care Physician:Raju, Dorris Singh, MD  Referring Physician: Vesta Mixer, MD [address] [place],  [place] [address]  Chief complaint:   Shortness of breath  HPI:  Inability to blow up of the lumen Voice is recovering recently  Had a stroke September and August August at September 2023 She was on the ventilator for about a day  She is trying to stay active She does have some right-sided weakness  voice is coming back with therapy  Never smoker -Was exposed to secondhand smoke growing up  Does have chronic back pain History of coronary artery disease     Outpatient Encounter Medications as of 01/02/2023  Medication Sig  . acetaminophen (TYLENOL) 325 MG tablet Take 2 tablets (650 mg total) by mouth every 4 (four) hours as needed for mild pain (or temp > 37.5 C (99.5 F)). (Patient taking differently: Take 650 mg by mouth 2 (two) times daily.)  . apixaban (ELIQUIS) 5 MG TABS tablet Take 1 tablet (5 mg total) by mouth 2 (two) times daily.  . carvedilol (COREG) 3.125 MG tablet Take 1 tablet (3.125 mg total) by mouth 2 (two) times daily.  . cyclobenzaprine (FLEXERIL) 10 MG tablet TAKE ONE TABLET AT BEDTIME  . famotidine (PEPCID) 20 MG tablet Take 1 tablet (20 mg total) by mouth 2 (two) times daily.  Creig Hines MAINTENANCE PACK 10 MCG INST Place vaginally 2 (two) times a week.  . loratadine (CLARITIN) 10 MG tablet Take 1 tablet (10 mg total) by mouth daily.  . mirabegron ER (MYRBETRIQ) 50 MG TB24 tablet Take 25 mg by mouth daily.  . Naltrexone HCl, Pain, 1.5 MG CAPS Take 1 mg by mouth daily at 6 (six) AM.  . potassium chloride (KLOR-CON) 10 MEQ tablet Take 1 tablet (10 mEq total) by mouth 2 (two) times daily.  . pregabalin (LYRICA) 100 MG capsule Take 1 capsule (100 mg total) by mouth daily.  . pregabalin (LYRICA) 150 MG capsule Take 1 capsule (150 mg total) by mouth at bedtime.  . rosuvastatin (CRESTOR) 20  MG tablet Take 1 tablet (20 mg total) by mouth daily.  Marland Kitchen spironolactone (ALDACTONE) 25 MG tablet Take 0.5 tablets (12.5 mg total) by mouth daily.  Marland Kitchen topiramate (TOPAMAX) 50 MG tablet Take 1 tablet (50 mg total) by mouth 2 (two) times daily.  . valsartan (DIOVAN) 80 MG tablet Take 1 tablet (80 mg total) by mouth daily.   No facility-administered encounter medications on file as of 01/02/2023.    Allergies as of 01/02/2023 - Review Complete 12/22/2022  Allergen Reaction Noted  . Doxycycline Nausea Only 11/03/2014  . Erythromycin Nausea And Vomiting 09/11/2013  . Iodine  01/21/2022  . Other Diarrhea 12/22/2022  . Vancomycin  01/21/2022  . Vicodin [hydrocodone-acetaminophen] Other (See Comments) 09/11/2013  . Dilaudid [hydromorphone hcl] Rash 02/15/2014  . Keflex [cephalexin] Rash 11/24/2013  . Penicillins Rash 09/11/2013  . Sulfa antibiotics Rash 09/11/2013    Past Medical History:  Diagnosis Date  . Anxiety   . Back pain   . Discitis of lumbar region 11/16/2013   L3-4/notes 11/24/2013, arms, neck  . Exertional asthma   . GERD (gastroesophageal reflux disease)   . Heart attack (HCC) 01/18/2022  . Hypertension   . Kidney stones    "have always passed them"  . Neck pain   . Osteomyelitis (HCC) 11/23/2013   osteomyelitis, discitis   .  Sleep concern    uses Prozac for sleep   . Stroke (HCC) 01/21/2022   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; left with aphasia, dysarthria, and apraxia    Past Surgical History:  Procedure Laterality Date  . BREAST CYST EXCISION Left 2010   "polypectomy"  . IR CT HEAD LTD  01/21/2022  . IR CT HEAD LTD  01/28/2022  . IR PERCUTANEOUS ART THROMBECTOMY/INFUSION INTRACRANIAL INC DIAG ANGIO  01/21/2022  . IR PERCUTANEOUS ART THROMBECTOMY/INFUSION INTRACRANIAL INC DIAG ANGIO  01/28/2022  . IR US GUIDE VASC ACCESS RIGHT  01/22/2022  . PICC LINE PLACE PERIPHERAL ([facility] HX) Right    for use of Levaquin & Vancomycin, of note: she reports that she had a "flulike feeling"    . RADIOLOGY WITH ANESTHESIA N/A 01/21/2022   Procedure: IR WITH ANESTHESIA;  Surgeon: Julieanne Cotton, MD;  Location: [facility] OR;  Service: Radiology;  Laterality: N/A;  . RADIOLOGY WITH ANESTHESIA N/A 01/28/2022   Procedure: IR WITH ANESTHESIA;  Surgeon: Radiologist, Medication, MD;  Location: [facility] OR;  Service: Radiology;  Laterality: N/A;  . TONSILLECTOMY AND ADENOIDECTOMY  1975  . TUBAL LIGATION  1999    Family History  Problem Relation Age of Onset  . Other Mother   . Other Father   . Hypertension Father   . Cancer Sister   . Sleep apnea Sister     Social History   Socioeconomic History  . Marital status: Married    Spouse name: Chloe Johnson  . Number of children: 3  . Years of education: Charity fundraiser  . Highest education level: Not on file  Occupational History    Employer: [employer]   Tobacco Use  . Smoking status: Never    Passive exposure: Past  . Smokeless tobacco: Never  Vaping Use  . Vaping status: Never Used  Substance and Sexual Activity  . Alcohol use: Yes    Alcohol/week: 2.0 standard drinks of alcohol    Types: 2 Shots of liquor per week    Comment: "a little"; 1/2 a shot x 3 per week  . Drug use: No    Comment: takes CBD gummies for sleep and anxiety  . Sexual activity: Yes    Birth control/protection: Post-menopausal  Other Topics Concern  . Not on file  Social History Narrative   Patient is married and lives at home with her husband Chloe Johnson).  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 Right handed.College education.         Social Determinants of Health   Financial Resource Strain: Not on file  Food Insecurity: Not on file  Transportation Needs: Not on file  Physical Activity: Not on file  Stress: Not on file  Social Connections: Not on file  Intimate Partner Violence: Not on file    Review of Systems  Constitutional:  Negative for fatigue.   Respiratory:  Positive for shortness of breath.   Psychiatric/Behavioral:  Positive for sleep disturbance.     There were no vitals filed for this visit.   Physical Exam Constitutional:      Appearance: Normal appearance.  HENT:     Head: Normocephalic.     Mouth/Throat:     Mouth: Mucous membranes are moist.  Eyes:     General: No scleral icterus.    Pupils: Pupils are equal, round, and reactive to light.  Cardiovascular:     Rate and Rhythm: Normal  rate and regular rhythm.     Heart sounds: No murmur heard.    No friction rub.  Pulmonary:     Effort: No respiratory distress.     Breath sounds: No stridor. No wheezing or rhonchi.  Musculoskeletal:     Cervical back: No rigidity or tenderness.  Neurological:     Mental Status: She is alert.  Psychiatric:        Mood and Affect: Mood normal.     Data Reviewed: No PFT on record  Chest x-ray recently was normal  Assessment:  ***  Plan/Recommendations: ***   Virl Diamond MD [facility] 01/02/2023, 2:56 PM  CC: Nahser, Deloris Ping, MD

## 2023-01-13 DIAGNOSIS — M75111 Incomplete rotator cuff tear or rupture of right shoulder, not specified as traumatic: Secondary | ICD-10-CM | POA: Diagnosis not present

## 2023-01-14 DIAGNOSIS — M6289 Other specified disorders of muscle: Secondary | ICD-10-CM | POA: Diagnosis not present

## 2023-01-14 DIAGNOSIS — M62838 Other muscle spasm: Secondary | ICD-10-CM | POA: Diagnosis not present

## 2023-01-14 DIAGNOSIS — N3946 Mixed incontinence: Secondary | ICD-10-CM | POA: Diagnosis not present

## 2023-01-14 DIAGNOSIS — M6281 Muscle weakness (generalized): Secondary | ICD-10-CM | POA: Diagnosis not present

## 2023-01-29 ENCOUNTER — Encounter: Payer: Self-pay | Admitting: Physical Medicine and Rehabilitation

## 2023-02-02 ENCOUNTER — Encounter: Payer: Self-pay | Admitting: Pulmonary Disease

## 2023-02-05 DIAGNOSIS — N3000 Acute cystitis without hematuria: Secondary | ICD-10-CM | POA: Diagnosis not present

## 2023-02-05 DIAGNOSIS — R350 Frequency of micturition: Secondary | ICD-10-CM | POA: Diagnosis not present

## 2023-02-06 NOTE — Telephone Encounter (Signed)
Pft is scheduled for 09/18 pt was called by the hospital

## 2023-02-10 ENCOUNTER — Encounter
Payer: Federal, State, Local not specified - PPO | Attending: Registered Nurse | Admitting: Physical Medicine and Rehabilitation

## 2023-02-10 ENCOUNTER — Encounter: Payer: Self-pay | Admitting: Physical Medicine and Rehabilitation

## 2023-02-10 VITALS — BP 130/87 | HR 63 | Ht 60.0 in | Wt 135.4 lb

## 2023-02-10 DIAGNOSIS — R29898 Other symptoms and signs involving the musculoskeletal system: Secondary | ICD-10-CM | POA: Diagnosis not present

## 2023-02-10 DIAGNOSIS — I639 Cerebral infarction, unspecified: Secondary | ICD-10-CM | POA: Insufficient documentation

## 2023-02-10 DIAGNOSIS — L989 Disorder of the skin and subcutaneous tissue, unspecified: Secondary | ICD-10-CM | POA: Diagnosis not present

## 2023-02-10 DIAGNOSIS — I4891 Unspecified atrial fibrillation: Secondary | ICD-10-CM | POA: Insufficient documentation

## 2023-02-10 NOTE — Progress Notes (Signed)
Subjective:    Patient ID: Chloe Johnson, female    DOB: August 31, 1955, 67 y.o.   MRN: [id number]  HPI:   ALEYZA Johnson is a 67 y.o. female who is here for f/u of right shoulder pain, Left Middle Cerebral Artery Stroke, Left Ventricular Thrombus and Essential Hypertension.   Dr. Iver Nestle H&P on 01/28/2022 HPI: Chloe Johnson is a 67 y.o. 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 Amada Jupiter, with key details confirmed by husband Chloe Johnson  is a 15 y.o.  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 She presents today after the following series events.  Around 12 PM this past Saturday, the patient noted sudden onset dizziness described as "vertigo" followed by nausea then followed by an episode of vomiting.  She next felt a sudden onset sharp chest pain with radiation to her back.  She assumed it was her cervical radiculopathy "acting up" because shortly after this event, she was holding 2 plates in her hand to feed her cats when she suddenly had no control of both upper extremities and dropped said plates.  On Sunday, she felt generally weak but then improved from all of this until this morning, around 9 AM, when she experienced word finding difficulties.  Her husband, who is at bedside, also notes she had a right facial droop transiently that lasted no more than a few minutes.  She may have had 2 subsequent episodes, as documented by ED  physician; however, all of her is told by the patient's husband that this was 1 single episode."   Her hospital course was noted for acute worsening for which she underwent thrombectomy and was discharged with only some very mild residual aphasia not detectable on examination but noticed by the patient per husband.  He notes that she has been in her normal state of health since discharge other than a visit to the ED on 9/3 with left shoulder pain which was attributed to her known cervical stenosis for which she is planned for surgery in the near future   Husband reports that she was taking his blood pressure when suddenly the blood pressure monitor fell off the table.  She tried to catch it but lost her balance had weakness in both of her legs.  Initially she got to both both arms but then became weak on the right side.  She was unable to speak at all suddenly during this event.  Work-up revealed a left ventricle apical thrombus.   Has been reports she has not missed any of her doses of medication including Eliquis   CTA was attempted to confirm recurrent acute occlusion, however due to IV infiltration and severity of her symptoms decision was made to proceed directly to IR which was discussed both with [name] as well as with the patient's husband   MR Brain:  IMPRESSION: 1. 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 2. No intracranial large vessel occlusion or significant stenosis. In particular, left MCA branches appear perfused and without focal stenosis.  MRA:  IMPRESSION:  1. 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 2. No intracranial large vessel occlusion or significant stenosis. In particular, left MCA branches appear perfused and without focal stenosis.  [name] COURSE Stroke:  left MCA punctate scattered infarct with left M2 occlusion s/p IR with TICI2c, embolic secondary to LV thrombus  recently on Eliquis CT no acute abnormality IR left superior M2 occlusion, inferior M3/M4 nonocclusive thrombus Status post EVT with TICI2c MRI left insular cortex and frontal lobe punctate infarcts MRA unremarkable 2D Echo EF 45 to 50% on 8/29, LV regional motion abnormality with LV thrombus LDL 150 on 8/29 HgbA1c 5 0 on 8/29 UDS positive for THC Hypercoagulable and autoimmune work up negative Heparin IV for VTE prophylaxis aspirin 81 mg daily and Eliquis (apixaban) daily prior to admission,  now on coumadin with lovenox bridge. INR goal 2.5 Continue ASA 81 per cardiology. Ongoing aggressive stroke risk factor management Therapy recommendations:  CIR Disposition:  pending   Hx of stroke and LV thrombus 8/29 admitted for nausea vomiting dizziness chest pain and then developed right facial droop and aphasia.  Found to have troponin > 300, EF 45 to 50% with LV thrombus.  CTA coronary artery showed minimal CAD.  CT no acute abnormality.  CTA head and neck showed left M2 occlusion.  CTP 0/7.  Status post EVT with TICI2c.  MRI showed left frontal [place] matter punctate infarct.  Repeat MRI showed left external capsule, insular cortex and frontal cortex punctate infarcts.  LDL 150, A1c 5.0.  UDS positive for THC.  Patient was put on heparin IV and aspirin and eventually discharged on Eliquis and aspirin as well as Crestor 20. Per family, patient has slight expressive aphasia since discharge, no physical deficit. Cardiology [name] on board, recommend coumadin with INR goal 2.5. on lovenox bridge.    Chloe Johnson was admitted to inpatient rehabilitation on 02/04/2022 and discharged home on 02/07/2022. She will begin outpatient Therapy on 02/25/2022 at [facility].  Chloe Johnson has expressive aphasia.  She was positive with THC, she reports she uses CBD products for anxiety, this was discussed with [name]. [name] stated she can continue with her CBD product, she  verbalizes understanding.   1) Right shoulder pain  -pain has been severe -she was not sure if she wanted to increase the topamax -she has been waking up and doing more since she went down on the lyrica -started suddenly when she was doing an activity (history is limited by her aphasia) -she feels this pain is more than just spasticity -it is very severe -she has been doing better with the increase in Lyrica to 200mg  BID -XR discussed and she would like to get this -she prefers nonsurgical treatment if possible.  -she discussed with Dr. Everardo Pacific and surgery was not recommended due to risk of re-tear -she is scheduled for steroid injection -she would be interested in starting topamax which we discussed before -pain has returned but she sees Dr. Everardo Pacific next week and she is going to ask for another injection -she thinks her shoulder is healing -positioning in car was causing the 10/10 of pain  2) Aphasia is greatly improving and she continues working with therapy -she is speaking so well today -long term disability was improved -not frustrated with speech but it is seems like it it shouldn't be this hard -she is still struggling -notes word finding difficuties -sometimes she feels frustrated as she can't find the  thought she wants.  -she is going to be discharged from therapy on may 8th -she feels that she continues to improve every day -she slows down when she sees confusion on someone's face -does not feel she can return to work yet   3) UTI: -pyridium helped with her UTI symptoms  4) CHF -Entresto has improved  5) HTN -BP has improved -she is taking the spironolactone and valsartan  6) Atrial fibrillation: -she is so happy to be transitioned to Eliquis and is happy she has no dietary restrictions  7) Overweight: -her diety has improved and weight has improved  8) CVA: -she has started getting the HBOT at [facility] and loves it -they have 2 HBOT set up there  yet -you have to be able to step into it and be spry -in 5 minutes the chamber blows up around her   Pain Inventory Average Pain 2 Pain Right Now 0 My pain is constant and aching  LOCATION OF PAIN  Back  BOWEL Number of stools per week: 14  Type of laxative Super Cleanse  BLADDER Normal  Mobility walk without assistance how many minutes can you walk? As needed ability to climb steps?  yes do you drive?  yes Do you have any goals in this area?  yes  Function disabled: date disabled 01/21/22  Neuro/Psych bladder control problems bowel control problems weakness numbness anxiety  Prior Studies Any changes since last visit?  no  Physicians involved in your care Any changes since last visit?  no   Family History  Problem Relation Age of Onset   Other Mother    Other Father    Hypertension Father    Cancer Sister    Sleep apnea Sister    Social History   Socioeconomic History   Marital status: Married    Spouse name: Leonette Most   Number of children: 3   Years of education: RN   Highest education level: Not on file  Occupational History    Employer: [employer]   Tobacco Use   Smoking status: Never    Passive exposure: Past   Smokeless tobacco: Never  Vaping Use   Vaping status: Never Used  Substance and Sexual Activity   Alcohol use: Yes    Alcohol/week: 2.0 standard drinks of alcohol    Types: 2 Shots of liquor per week    Comment: "a little"; 1/2 a shot x 3 per week   Drug use: No    Comment: takes CBD gummies for sleep and anxiety   Sexual activity: Yes    Birth control/protection: Post-menopausal  Other Topics Concern   Not on file  Social History Narrative   Patient is married and lives at home with her husband Leonette Most).  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 Right handed.College education.         Social Determinants of Health    Financial Resource Strain: Not on file  Food Insecurity: Not on file  Transportation Needs: Not on file  Physical Activity: Not on file  Stress: Not on file  Social Connections: Not on file   Past Surgical History:  Procedure Laterality Date   BREAST CYST EXCISION Left 2010   "polypectomy"   IR CT HEAD LTD  01/21/2022   IR CT HEAD LTD  01/28/2022   IR PERCUTANEOUS ART THROMBECTOMY/INFUSION INTRACRANIAL INC DIAG ANGIO  01/21/2022   IR  PERCUTANEOUS ART THROMBECTOMY/INFUSION INTRACRANIAL INC DIAG ANGIO  01/28/2022   IR US GUIDE VASC ACCESS RIGHT  01/22/2022   PICC LINE PLACE PERIPHERAL ([facility] HX) Right    for use of Levaquin & Vancomycin, of note: she reports that she had a "flulike feeling"    RADIOLOGY WITH ANESTHESIA N/A 01/21/2022   Procedure: IR WITH ANESTHESIA;  Surgeon: Julieanne Cotton, MD;  Location: [facility] OR;  Service: Radiology;  Laterality: N/A;   RADIOLOGY WITH ANESTHESIA N/A 01/28/2022   Procedure: IR WITH ANESTHESIA;  Surgeon: Radiologist, Medication, MD;  Location: [facility] OR;  Service: Radiology;  Laterality: N/A;   TONSILLECTOMY AND ADENOIDECTOMY  1975   TUBAL LIGATION  1999   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/16/2013   L3-4/notes 11/24/2013, arms, neck   Exertional asthma    GERD (gastroesophageal reflux disease)    Heart attack (HCC) 01/18/2022   Hypertension    Kidney stones    "have always passed them"   Neck pain    Osteomyelitis (HCC) 11/23/2013   osteomyelitis, discitis    Sleep concern    uses Prozac for sleep    Stroke (HCC) 01/21/2022   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; left with aphasia, dysarthria, and apraxia   BP 130/87   Pulse 63   Ht 5' (1.524 m)   Wt 135 lb 6.4 oz (61.4 kg)   SpO2 98%   BMI 26.44 kg/m   Opioid Risk Score:   Fall Risk Score:  `1  Depression screen [facility] 2/9     02/10/2023    2:31 PM 12/08/2022    2:35 PM 07/11/2022   10:19 AM 02/20/2022    9:22 AM 02/15/2014   11:06 AM  01/18/2014    2:15 PM 12/23/2013   10:14 AM  Depression screen PHQ 2/9  Decreased Interest 0 0 0 0 0 0 0  Down, Depressed, Hopeless 0 0 0 0 1 0 0  PHQ - 2 Score 0 0 0 0 1 0 0  Altered sleeping    0     Tired, decreased energy    1     Change in appetite    0     Feeling bad or failure about yourself     0     Trouble concentrating    0     Moving slowly or fidgety/restless    0     Suicidal thoughts    0     PHQ-9 Score    1     Difficult doing work/chores    Not difficult at all         Review of Systems  Constitutional: Negative.   HENT: Negative.    Eyes: Negative.   Respiratory: Negative.    Cardiovascular: Negative.   Gastrointestinal: Negative.   Endocrine: Negative.   Genitourinary: Negative.   Musculoskeletal:  Positive for back pain and neck pain.       Wrist and hand pain   Skin: Negative.   Allergic/Immunologic: Negative.   Neurological:  Positive for weakness.  Hematological:  Bruises/bleeds easily.       Apixaban  Psychiatric/Behavioral:  The patient is nervous/anxious.   All other systems reviewed and are negative.       Objective:  Gen: no distress, normal appearing HEENT: oral mucosa pink and moist, NCAT Cardio: Reg rate Chest: normal effort, normal rate of breathing Abd: soft, non-distended Ext: no edema  Psych: pleasant, normal affect Skin: intact MSK: mild spasticity in right hand fingers. Sensation intact. Thumb flexion 2/5. Improving speech rate ant intelligibility. Improving right shoulder range of motio     Assessment & Plan:  Left Middle Cerebral Artery Stroke: Continue current medication regimen. Has a scheduled appointment with Neurology. She will begin outpatient therapy at [facility]. Continue to Monitor. Continue speech therapy to continue working on aphasia Discussed that she went to the stroke recovery meeting Referred to OT for vivstem referral Discussed dry needling of her right arm -discussed that ske has been  using blue kyanite crystals Discussed that she got up to 1.9 atmospheres of pressure and it took a long time to get up there because she had a dental infection Left Ventricular Thrombus: Continue current medication regimen. Cardiology Following.  Essential Hypertension. Continue current medication regimen. PCP Following. Continue to Monitor. Continue Valsartan Right shoulder pain: Discussed that MRI shows complete tear, referred to ortho Discussed extracorporeal shockwave therapy as a modality for treatment. Discussed that the device looks and feels like a massage gun and I would move it over the area of pain for about 10 minutes. The device releases sound waves to the area of pain and helps to improve blood flow and circulation to improve the healing process. Discuss that this initially induces inflammation and can sometimes cause short-term increase in pain. Discussed that we typically do three weekly treatments, but sometimes up to 6 if needed, and after 6 weeks long term benefits can sometimes be achieved.  Lyrica increased to 200mg  BID, discussed that she can continue this dose if she wants but given her unwanted weight gain we can alternatively replace her morning dose with low dose naltrexone. She preferred to try this latter option. -discussed mechanism of action of low dose naltrexone as an opioid receptor antagonist which stimulates your body's production of its own natural endogenous opioids, helping to decrease pain. Discussed that it can also decrease T cell response and thus be helpful in decreasing inflammation, and symptoms of brain fog, fatigue, anxiety, depression, and allergies. Discussed that this medication needs to be compounded at a compounding pharmacy and can more expensive. Discussed that I usually start at 1mg  and if this is not providing enough relief then I titrate upward on a monthly basis.   Prescribed topamax to help improve sleep and pain, discussed that it can also curb  appetite Encouraged follow-up with Dr. Everardo Pacific next week Discussed that she just received her right shoulder steroid injection  5. Overweight: -continue topamax 50mg  in the day and increase night time topamax to 75mg  -discussed her current diet -decrease Lyrica 100mg  daily  -Educated that current weight is 150 lbs, BMI 29.29 -Educated regarding health benefits of weight loss- for pain, general health, chronic disease prevention, immune health, mental health.  -Will monitor weight every visit.  -Consider Roobois tea daily.  -Discussed the benefits of intermittent fasting. -Discussed foods that can assist in weight loss: 1) leafy greens- high in fiber and nutrients 2) dark chocolate- improves metabolism (if prefer sweetened, best to sweeten with honey instead of sugar).  3) cruciferous vegetables- high in fiber and protein 4) full fat yogurt: high in healthy fat, protein, calcium, and probiotics 5) apples- high in a variety of phytochemicals 6) nuts- high in fiber and protein that increase feelings of fullness 7) grapefruit: rich in nutrients, antioxidants, and fiber (not to be taken with anticoagulation) 8) beans- high in protein and fiber 9) salmon- has high quality protein  and healthy fats 10) green tea- rich in polyphenols 11) eggs- rich in choline and vitamin D 12) tuna- high protein, boosts metabolism 13) avocado- decreases visceral abdominal fat 14) chicken (pasture raised): high in protein and iron 15) blueberries- reduce abdominal fat and cholesterol 16) whole grains- decreases calories retained during digestion, speeds metabolism 17) chia seeds- curb appetite 18) chilies- increases fat metabolism  -Discussed supplements that can be used:  1) Metatrim 400mg  BID 30 minutes before breakfast and dinner  2) Sphaeranthus indicus and Garcinia mangostana (combinations of these and #1 can be found in capsicum and zychrome  3) green coffee bean extract 400mg  twice per day or Irvingia  (african mango) 150 to 300mg  twice per day.   5. Right hand weakness:  -continue OT -discussed vivistim -discussed e-stim and that she bought her own tens unit -discussed that she started working with a hand specialist -discussed her interest in hand anatomy -provided video to learn how the MCA stroke affects hand strength.   6) Aphasia: -discussed her progress with SLP -discussed that she has noted improvements in her speech and cognition since starting the HBOT -discussed that this is still a limitation in her returning to work -discussed that she is now returning to reading! She still gets stuck   7) UTI -previously prescribed macrobin 100mg  BID for 7 days -discussed UA/UC results -recommended staying well hydrated -recommended U[place] for improving vaginal microbiome  8) Lumbar spinal stenosis: -discussed that we can increase topamax to 75mg  at night and continue the 50mg  during the day, continue this regimen  9) Spider bites: -examined her right arm -discussed that she is trying to avoid benadryl -discussed that itching has improved  10) atrial fibrillation: -continue Eliquis -discussed that she is happy that she does not have dietary restrictions  11) Urinary incontinence: -discussed Inwave  12) Lesion on her right arm:  -discussed that she developed after using infrared light therapy -recommended manuka honey -continue neosporin  >40 minutes spent in discussion of her hyperbaric oxygen therapy and that she was checked on every 5 minutes and she was popping her nose but she was found to have a dental infection, dicussed that all of a second she could not breathes and think and she was told to call the front desk if she went into turbo, the pressure changed and she could breathe all of a sudden, so she went slower, but this kept her fro getting to her goal of 1.9 but they just got there 7 times ago, she notes that her brain is thinking so much better, she is going to say  let's do 20 sessions at 1.9 and then test her, discussed that her observation has improved, discussed inwave for urinary incontinence

## 2023-02-11 ENCOUNTER — Ambulatory Visit (HOSPITAL_COMMUNITY)
Admission: RE | Admit: 2023-02-11 | Discharge: 2023-02-11 | Disposition: A | Discharge status: Home / Self Care | Payer: Federal, State, Local not specified - PPO | Source: Ambulatory Visit | Attending: Pulmonary Disease | Admitting: Pulmonary Disease

## 2023-02-11 DIAGNOSIS — R0600 Dyspnea, unspecified: Secondary | ICD-10-CM | POA: Diagnosis not present

## 2023-02-11 LAB — PULMONARY FUNCTION TEST
DL/VA % pred: 89 %
DL/VA: 3.84 ml/min/mmHg/L
DLCO unc % pred: 98 %
DLCO unc: 16.95 ml/min/mmHg
FEF 25-75 Pre: 2.43 L/s
FEF2575-%Pred-Pre: 127 %
FEV1-%Pred-Pre: 115 %
FEV1-Pre: 2.36 L
FEV1FVC-%Pred-Pre: 105 %
FEV6-%Pred-Pre: 112 %
FEV6-Pre: 2.91 L
FEV6FVC-%Pred-Pre: 104 %
FVC-%Pred-Pre: 108 %
FVC-Pre: 2.91 L
Pre FEV1/FVC ratio: 81 %
Pre FEV6/FVC Ratio: 100 %
RV % pred: 103 %
RV: 1.95 L
TLC % pred: 115 %
TLC: 5.15 L

## 2023-02-11 MED ORDER — ALBUTEROL SULFATE (2.5 MG/3ML) 0.083% IN NEBU
2.5000 mg | INHALATION_SOLUTION | Freq: Once | RESPIRATORY_TRACT | Status: DC
Start: 2023-02-11 — End: 2023-02-12

## 2023-02-12 ENCOUNTER — Telehealth: Payer: Self-pay

## 2023-02-12 NOTE — Telephone Encounter (Signed)
Patient called back and made payment. Asked for call back once completed. Placed in POD 2

## 2023-02-12 NOTE — Telephone Encounter (Signed)
Received form from the [place], received on 02/06/23. Reached out to patient regarding $50 form fee. Left voicemail for patient requesting call .

## 2023-02-16 DIAGNOSIS — Z0289 Encounter for other administrative examinations: Secondary | ICD-10-CM

## 2023-02-17 DIAGNOSIS — R531 Weakness: Secondary | ICD-10-CM | POA: Diagnosis not present

## 2023-02-17 DIAGNOSIS — M2560 Stiffness of unspecified joint, not elsewhere classified: Secondary | ICD-10-CM | POA: Diagnosis not present

## 2023-02-17 DIAGNOSIS — M25641 Stiffness of right hand, not elsewhere classified: Secondary | ICD-10-CM | POA: Diagnosis not present

## 2023-02-17 DIAGNOSIS — I69393 Ataxia following cerebral infarction: Secondary | ICD-10-CM | POA: Diagnosis not present

## 2023-02-18 NOTE — Telephone Encounter (Signed)
Paperwork signed and place din outgoing Medical records

## 2023-02-19 NOTE — Telephone Encounter (Signed)
Form faxed and confirmation received. Placed in MR for filing

## 2023-02-25 ENCOUNTER — Other Ambulatory Visit: Payer: Self-pay | Admitting: Cardiovascular Disease

## 2023-02-26 DIAGNOSIS — R531 Weakness: Secondary | ICD-10-CM | POA: Diagnosis not present

## 2023-02-26 DIAGNOSIS — M2560 Stiffness of unspecified joint, not elsewhere classified: Secondary | ICD-10-CM | POA: Diagnosis not present

## 2023-02-26 DIAGNOSIS — I69393 Ataxia following cerebral infarction: Secondary | ICD-10-CM | POA: Diagnosis not present

## 2023-02-26 DIAGNOSIS — M25641 Stiffness of right hand, not elsewhere classified: Secondary | ICD-10-CM | POA: Diagnosis not present

## 2023-03-06 DIAGNOSIS — M2560 Stiffness of unspecified joint, not elsewhere classified: Secondary | ICD-10-CM | POA: Diagnosis not present

## 2023-03-06 DIAGNOSIS — I69393 Ataxia following cerebral infarction: Secondary | ICD-10-CM | POA: Diagnosis not present

## 2023-03-06 DIAGNOSIS — M25641 Stiffness of right hand, not elsewhere classified: Secondary | ICD-10-CM | POA: Diagnosis not present

## 2023-03-06 DIAGNOSIS — R531 Weakness: Secondary | ICD-10-CM | POA: Diagnosis not present

## 2023-03-10 ENCOUNTER — Other Ambulatory Visit: Payer: Self-pay | Admitting: Physical Medicine and Rehabilitation

## 2023-03-10 DIAGNOSIS — M25641 Stiffness of right hand, not elsewhere classified: Secondary | ICD-10-CM | POA: Diagnosis not present

## 2023-03-10 DIAGNOSIS — M2560 Stiffness of unspecified joint, not elsewhere classified: Secondary | ICD-10-CM | POA: Diagnosis not present

## 2023-03-10 DIAGNOSIS — I69393 Ataxia following cerebral infarction: Secondary | ICD-10-CM | POA: Diagnosis not present

## 2023-03-10 DIAGNOSIS — R531 Weakness: Secondary | ICD-10-CM | POA: Diagnosis not present

## 2023-03-17 DIAGNOSIS — M2560 Stiffness of unspecified joint, not elsewhere classified: Secondary | ICD-10-CM | POA: Diagnosis not present

## 2023-03-17 DIAGNOSIS — M25641 Stiffness of right hand, not elsewhere classified: Secondary | ICD-10-CM | POA: Diagnosis not present

## 2023-03-17 DIAGNOSIS — I69393 Ataxia following cerebral infarction: Secondary | ICD-10-CM | POA: Diagnosis not present

## 2023-03-17 DIAGNOSIS — R531 Weakness: Secondary | ICD-10-CM | POA: Diagnosis not present

## 2023-03-19 DIAGNOSIS — M6281 Muscle weakness (generalized): Secondary | ICD-10-CM | POA: Diagnosis not present

## 2023-03-19 DIAGNOSIS — M62838 Other muscle spasm: Secondary | ICD-10-CM | POA: Diagnosis not present

## 2023-03-19 DIAGNOSIS — M6289 Other specified disorders of muscle: Secondary | ICD-10-CM | POA: Diagnosis not present

## 2023-03-19 DIAGNOSIS — N3946 Mixed incontinence: Secondary | ICD-10-CM | POA: Diagnosis not present

## 2023-03-20 DIAGNOSIS — N3000 Acute cystitis without hematuria: Secondary | ICD-10-CM | POA: Diagnosis not present

## 2023-03-20 DIAGNOSIS — R8271 Bacteriuria: Secondary | ICD-10-CM | POA: Diagnosis not present

## 2023-03-23 ENCOUNTER — Encounter: Payer: Self-pay | Admitting: Cardiovascular Disease

## 2023-03-23 NOTE — Progress Notes (Addendum)
Cardiology Office Note:    Date:  03/24/2023   ID:  Chloe Johnson, DOB [birth year], MRN [id number]  PCP:  Thana Ates, MD   [facility] Providers Cardiologist: Travin Marik    Referring MD: Thana Ates, MD   Chief Complaint  Patient presents with   takotsubo syndrome   Cerebrovascular Accident     History of Present Illness:    Chloe Johnson is a 67 y.o. female with a hx of  ? 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  She was thought to have had an eliquis failure and  She has now been transitioned to coumadin /Lovenox. I actually think the issues was that she just had not been on any anticoagulant long enough to start dissolving the thrombus   Has an expressive aphasia  Improving with rehab. Improving daily   INR followed at [facility]  Last INR is 1.3   Oct. 16, 2023 Chloe Johnson is seen for follow up of her CHF, LV thrombus and stroke Echo several days ago shows normal LV function without mention of segmental wall motion abn. Mild MR, trivial TR  Mild PI   Her last INR is 4.3 on Oct. 12, 2023  Managed by our coumadin clinic   Oct 01, 2022 Chloe Johnson is seen for follow up of her CHF, LV thrombus, and stroke  She wants to stop warfarin and start Eliquis  She has seen Dr. Myna Hidalgo and see if Eliquis will be adequate for her factor II abnormalty   She thinks her entresto is causing her hypokalemia and she is not able too tolerate the Kdur ( stomach issues Wants to try valsartan instead   Also would like to stop her toprol XL ( since her LV function has improved ) I would like to continue the Toprol XL for now   December 22, 2022  Chloe Johnson is seen for follow up of her Takotsubo syndrome,  CHF, LV thrombus, stroke She originally presented with neuro/ stroke symptoms   We changed her from warfarin to Eliquis at her  last visit  We ordered PFTs for further evaluation of her dyspnea ,  she did not get a  Is doing hyperbaric oxygen therapy   Still has not received the newest pneumonia vaccine     Husband [name] developed extensive DVT and PE Memorial day weekend .  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  Is not going to return to work . Has long term disability   Oct. 29, 2024 Chloe Johnson is seen for follow up visit  Going to a chiropractic for hyperbaric chamber  3 times a week .  She is making progress  Also red light therapy Her speech is getting better  Still has some aphasia but she is clearly improving   Still on Eliquis   Walks for short distances  Walks around the house , does not need cane or walker She blows the leaves at her house  - walking very well   ADDENDUM 04/30/23  Pt was seen by Dr. Sidney Ace for genetic hypercoagulability evaluation   Chloe Johnson was seen virtually for her post-test genetic consult of inherited hypercoagulable conditions. Her identity was confirmed with two unique identifiers.   I informed her that the genetic test for inherited hypercoagulable states screened for Factor V Leiden mutation, prothrombin (F2 c.*97G>A) mutation  or deficiencies in protein C and protein S activity. 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   Explained to her that her mutations in F2 gene and Protein S inhibit the anticoagulation cascade, thus leading to a higher incidence of blood clots in association with other risk factors including surgery or increased immobility from travel or hospitalizations. It is highly likely that this genotype increased her risk of having a stroke. She verbalized understanding.   It is recommended that her daughters, ages 28, 29 and son age 73 undergo genetic testing for F2 mutation as well as determine their protein S activity levels. She tells me that one daughter  live sin [place] and the other two children are in [place]. She will discuss her results with them so they can get undergo screening. Also advised her to see a hematologist so that she can be managed and treated appropriately. She already has an appointment on 05/05/22  to see Dr. Myna Hidalgo.   Sidney Ace, Ph.D, [facility] Clinical Molecular Geneticis       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/16/2013   L3-4/notes 11/24/2013, arms, neck   Exertional asthma    GERD (gastroesophageal reflux disease)    Heart attack (HCC) 01/18/2022   Hypertension    Kidney stones    "have always passed them"   Neck pain    Osteomyelitis (HCC) 11/23/2013   osteomyelitis, discitis    Sleep concern    uses Prozac for sleep    Stroke (HCC) 01/21/2022   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; left with aphasia, dysarthria, and apraxia    Past Surgical History:  Procedure Laterality Date   BREAST CYST EXCISION Left 2010   "polypectomy"   IR CT HEAD LTD  01/21/2022   IR CT HEAD LTD  01/28/2022   IR PERCUTANEOUS ART THROMBECTOMY/INFUSION INTRACRANIAL INC DIAG ANGIO  01/21/2022   IR PERCUTANEOUS ART THROMBECTOMY/INFUSION INTRACRANIAL INC DIAG ANGIO  01/28/2022   IR US GUIDE VASC ACCESS RIGHT  01/22/2022   PICC LINE PLACE PERIPHERAL ([facility] HX) Right    for use of Levaquin & Vancomycin, of note: she reports that she had a "flulike feeling"    RADIOLOGY WITH ANESTHESIA N/A 01/21/2022   Procedure: IR WITH ANESTHESIA;  Surgeon: Julieanne Cotton, MD;  Location: [facility] OR;  Service: Radiology;  Laterality: N/A;   RADIOLOGY WITH ANESTHESIA N/A 01/28/2022   Procedure: IR WITH ANESTHESIA;  Surgeon: Radiologist, Medication, MD;  Location: [facility] OR;  Service: Radiology;  Laterality: N/A;   TONSILLECTOMY AND ADENOIDECTOMY  1975   TUBAL LIGATION  1999    Current Medications: Current Meds  Medication Sig   acetaminophen (TYLENOL) 325 MG tablet Take 2 tablets (650 mg total) by mouth  every 4 (four) hours as needed for mild pain (or temp > 37.5 C (99.5 F)). (Patient taking differently: Take 650 mg by mouth 2 (two) times daily.)   apixaban (ELIQUIS) 5 MG TABS tablet Take 1 tablet (5 mg total) by mouth 2 (two) times daily.   carvedilol (COREG) 3.125 MG tablet Take 1 tablet (3.125 mg total) by mouth 2 (two) times daily.   ciprofloxacin (CIPRO) 250 MG tablet 1 tablet Orally every 12 hrs for 7 day(s)   cyclobenzaprine (FLEXERIL) 10 MG tablet TAKE ONE TABLET AT BEDTIME   famotidine (PEPCID) 20 MG tablet TAKE ONE TABLET BY MOUTH  TWICE DAILY   IMVEXXY MAINTENANCE PACK 10 MCG INST Place vaginally 2 (two) times a week.   loratadine (CLARITIN) 10 MG tablet Take 1 tablet (10 mg total) by mouth daily.   mirabegron ER (MYRBETRIQ) 50 MG TB24 tablet Take 50 mg by mouth daily.   Naltrexone HCl, Pain, 1.5 MG CAPS Take 1.5 mg by mouth daily at 6 (six) AM.   potassium chloride (KLOR-CON) 10 MEQ tablet Take 1 tablet (10 mEq total) by mouth 2 (two) times daily.   pregabalin (LYRICA) 100 MG capsule Take 1 capsule (100 mg total) by mouth daily.   pregabalin (LYRICA) 150 MG capsule Take 1 capsule (150 mg total) by mouth at bedtime.   rosuvastatin (CRESTOR) 20 MG tablet Take 1 tablet (20 mg total) by mouth daily.   spironolactone (ALDACTONE) 25 MG tablet Take 0.5 tablets (12.5 mg total) by mouth daily.   topiramate (TOPAMAX) 50 MG tablet Take 1 tablet (50 mg total) by mouth 2 (two) times daily.   valsartan (DIOVAN) 80 MG tablet Take 1 tablet (80 mg total) by mouth daily.     Allergies:   Doxycycline, Erythromycin, Iodine, Other, Vancomycin, Vicodin [hydrocodone-acetaminophen], Dilaudid [hydromorphone hcl], Keflex [cephalexin], Penicillins, and Sulfa antibiotics   Social History   Socioeconomic History   Marital status: Married    Spouse name: Charles   Number of children: 3   Years of education: RN   Highest education level: Not on file  Occupational History    Employer: [employer]    Tobacco Use   Smoking status: Never    Passive exposure: Past   Smokeless tobacco: Never  Vaping Use   Vaping status: Never Used  Substance and Sexual Activity   Alcohol use: Yes    Alcohol/week: 2.0 standard drinks of alcohol    Types: 2 Shots of liquor per week    Comment: "a little"; 1/2 a shot x 3 per week   Drug use: No    Comment: takes CBD gummies for sleep and anxiety   Sexual activity: Yes    Birth control/protection: Post-menopausal  Other Topics Concern   Not on file  Social History Narrative   Patient is married and lives at home with her husband Leonette Most).  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 Right handed.College education.         Social Determinants of Health   Financial Resource Strain: Not on file  Food Insecurity: Not on file  Transportation Needs: Not on file  Physical Activity: Not on file  Stress: Not on file  Social Connections: Not on file     Family History: The patient's family history includes Cancer in her sister; Hypertension in her father; Other in her father and mother; Sleep apnea in her sister.  ROS:   Please see the history of present illness.     All other systems reviewed and are negative.  EKGs/Labs/Other Studies Reviewed:    The following studies were reviewed today:   EKG:         Recent Labs: 05/05/2022: ALT 13; Hemoglobin 13.2; Platelet Count 197 10/17/2022: BUN 13; Creatinine, Ser 0.81; Potassium 3.9; Sodium 141  Recent Lipid Panel    Component Value Date/Time   CHOL 223 (H) 01/22/2022 0351   TRIG 271 (H) 01/29/2022 0300   HDL 40 (L) 01/22/2022 0351   CHOLHDL 5.6 01/22/2022 0351   VLDL 33 01/22/2022 0351  LDLCALC 150 (H) 01/22/2022 0351     Risk Assessment/Calculations:       Physical Exam:     Physical Exam: Blood pressure 100/74, pulse 73, height 5' (1.524 m), weight 136 lb 12.8 oz (62.1 kg), SpO2  96%.       GEN:  Well nourished, well developed in no acute distress HEENT: Normal NECK: No JVD; No carotid bruits LYMPHATICS: No lymphadenopathy CARDIAC: RRR , no murmurs, rubs, gallops RESPIRATORY:  Clear to auscultation without rales, wheezing or rhonchi  ABDOMEN: Soft, non-tender, non-distended MUSCULOSKELETAL:  No edema; No deformity  SKIN: Warm and dry NEUROLOGIC:  hx of stroke,      ASSESSMENT:    1. Cerebrovascular accident (CVA) due to embolism of left middle cerebral artery (HCC)   2. HFrEF (heart failure with reduced ejection fraction) (HCC)   3. Takotsubo cardiomyopathy        PLAN:        Chronic systolic CHF :  presented with Takotsubo syndrome, LV thrombus and a stroke .    her LV fnction has normal normalized.   No thrombus on echo  She was only taking her carvedilol once a day.  She will start taking it twice a day and see how she does.  I would like for her to continue to spironolactone and valsartan.     2. LV thrombus:   She remains on Eliquis.  Seems to be doing well.     3.  Stroke:    continue eliquis   ADDENDUM 04/30/23  Pt was seen by Dr. Sidney Ace for genetic hypercoagulability evaluation   Chloe Johnson was seen virtually for her post-test genetic consult of inherited hypercoagulable conditions. Her identity was confirmed with two unique identifiers.   I informed her that the genetic test for inherited hypercoagulable states screened for Factor V Leiden mutation, prothrombin (F2 c.*97G>A) mutation or deficiencies in protein C and protein S activity. 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   Explained to her that her mutations in F2 gene and Protein S inhibit the anticoagulation cascade, thus leading to a higher incidence of blood clots in association with other risk factors including surgery or increased immobility from travel or hospitalizations. It  is highly likely that this genotype increased her risk of having a stroke. She verbalized understanding.   It is recommended that her daughters, ages 28, 26 and son age 93 undergo genetic testing for F2 mutation as well as determine their protein S activity levels. She tells me that one daughter live sin [place] and the other two children are in [place]. She will discuss her results with them so they can get undergo screening. Also advised her to see a hematologist so that she can be managed and treated appropriately. She already has an appointment on 05/05/22  to see Dr. Myna Hidalgo.   Sidney Ace, Ph.D, [facility] Clinical Molecular Geneticis      .4.  Dyspnea:      Medication Adjustments/Labs and Tests Ordered: Current medicines are reviewed at length with the patient today.  Concerns regarding medicines are outlined above.  No orders of the defined types were placed in this encounter.  No orders of the defined types were placed in this encounter.   Patient Instructions  Follow-Up: At [facility], you and your health needs are our priority.  As part of our continuing mission to provide you with exceptional heart care,  we have created designated Provider Care Teams.  These Care Teams include your primary Cardiologist (physician) and Advanced Practice Providers (APPs -  Physician Assistants and Nurse Practitioners) who all work together to provide you with the care you need, when you need it.  Your next appointment:   6 month(s)  Provider:   Kristeen Miss, MD        Signed, Kristeen Miss, MD  03/24/2023 10:28 AM    [facility]

## 2023-03-24 ENCOUNTER — Ambulatory Visit: Payer: Federal, State, Local not specified - PPO | Attending: Cardiovascular Disease | Admitting: Cardiovascular Disease

## 2023-03-24 ENCOUNTER — Encounter: Payer: Self-pay | Admitting: Cardiovascular Disease

## 2023-03-24 VITALS — BP 100/74 | HR 73 | Ht 60.0 in | Wt 136.8 lb

## 2023-03-24 DIAGNOSIS — I63412 Cerebral infarction due to embolism of left middle cerebral artery: Secondary | ICD-10-CM

## 2023-03-24 DIAGNOSIS — I5020 Unspecified systolic (congestive) heart failure: Secondary | ICD-10-CM

## 2023-03-24 DIAGNOSIS — I5181 Takotsubo syndrome: Secondary | ICD-10-CM

## 2023-03-24 NOTE — Patient Instructions (Signed)
Follow-Up: At [facility], you and your health needs are our priority.  As part of our continuing mission to provide you with exceptional heart care, we have created designated Provider Care Teams.  These Care Teams include your primary Cardiologist (physician) and Advanced Practice Providers (APPs -  Physician Assistants and Nurse Practitioners) who all work together to provide you with the care you need, when you need it.  Your next appointment:   6 month(s)  Provider:   Kristeen Miss, MD

## 2023-03-28 ENCOUNTER — Other Ambulatory Visit: Payer: Self-pay | Admitting: Cardiovascular Disease

## 2023-03-31 ENCOUNTER — Other Ambulatory Visit: Payer: Self-pay | Admitting: Physical Medicine and Rehabilitation

## 2023-03-31 DIAGNOSIS — R531 Weakness: Secondary | ICD-10-CM | POA: Diagnosis not present

## 2023-03-31 DIAGNOSIS — I69393 Ataxia following cerebral infarction: Secondary | ICD-10-CM | POA: Diagnosis not present

## 2023-03-31 DIAGNOSIS — M2560 Stiffness of unspecified joint, not elsewhere classified: Secondary | ICD-10-CM | POA: Diagnosis not present

## 2023-03-31 DIAGNOSIS — M25641 Stiffness of right hand, not elsewhere classified: Secondary | ICD-10-CM | POA: Diagnosis not present

## 2023-04-07 DIAGNOSIS — M2560 Stiffness of unspecified joint, not elsewhere classified: Secondary | ICD-10-CM | POA: Diagnosis not present

## 2023-04-07 DIAGNOSIS — I69393 Ataxia following cerebral infarction: Secondary | ICD-10-CM | POA: Diagnosis not present

## 2023-04-07 DIAGNOSIS — R531 Weakness: Secondary | ICD-10-CM | POA: Diagnosis not present

## 2023-04-07 DIAGNOSIS — M25641 Stiffness of right hand, not elsewhere classified: Secondary | ICD-10-CM | POA: Diagnosis not present

## 2023-04-10 DIAGNOSIS — Z1231 Encounter for screening mammogram for malignant neoplasm of breast: Secondary | ICD-10-CM | POA: Diagnosis not present

## 2023-04-10 DIAGNOSIS — Z01419 Encounter for gynecological examination (general) (routine) without abnormal findings: Secondary | ICD-10-CM | POA: Diagnosis not present

## 2023-04-10 DIAGNOSIS — N3946 Mixed incontinence: Secondary | ICD-10-CM | POA: Diagnosis not present

## 2023-04-21 DIAGNOSIS — M2560 Stiffness of unspecified joint, not elsewhere classified: Secondary | ICD-10-CM | POA: Diagnosis not present

## 2023-04-21 DIAGNOSIS — M25641 Stiffness of right hand, not elsewhere classified: Secondary | ICD-10-CM | POA: Diagnosis not present

## 2023-04-21 DIAGNOSIS — I69393 Ataxia following cerebral infarction: Secondary | ICD-10-CM | POA: Diagnosis not present

## 2023-04-21 DIAGNOSIS — R531 Weakness: Secondary | ICD-10-CM | POA: Diagnosis not present

## 2023-05-05 ENCOUNTER — Other Ambulatory Visit: Payer: Self-pay | Admitting: Cardiovascular Disease

## 2023-05-05 DIAGNOSIS — M2560 Stiffness of unspecified joint, not elsewhere classified: Secondary | ICD-10-CM | POA: Diagnosis not present

## 2023-05-05 DIAGNOSIS — I69393 Ataxia following cerebral infarction: Secondary | ICD-10-CM | POA: Diagnosis not present

## 2023-05-05 DIAGNOSIS — R531 Weakness: Secondary | ICD-10-CM | POA: Diagnosis not present

## 2023-05-05 DIAGNOSIS — M25641 Stiffness of right hand, not elsewhere classified: Secondary | ICD-10-CM | POA: Diagnosis not present

## 2023-05-06 ENCOUNTER — Telehealth: Payer: Self-pay | Admitting: Physical Medicine and Rehabilitation

## 2023-05-06 NOTE — Telephone Encounter (Signed)
Jamie from [facility] called in to inform us that patients current OT Jospehine Campeou is recommending patient follow up with OT neuro rehab , she has done everything she can but feels like patient would benefit from more neuro aspects . Recommendation from their office is [facility] Okey Dupre [phone] [place] or location in [place] [facility] [phone] . Patient is still scheduled for 2 more visits with them and will inform patient of their recommendations .

## 2023-05-06 NOTE — Telephone Encounter (Signed)
Prescription refill request for Eliquis received. Indication: Lt [place]ural thrombus/CVA Last office visit: 03/24/23  P Nahser MD Scr: 0.81 on 10/17/22  Epic Age: 67 Weight: 62.1kg  Based on above findings Eliquis 5mg  twice daily is the appropriate dose.  Refill approved.

## 2023-05-14 DIAGNOSIS — N3946 Mixed incontinence: Secondary | ICD-10-CM | POA: Diagnosis not present

## 2023-05-14 DIAGNOSIS — M6281 Muscle weakness (generalized): Secondary | ICD-10-CM | POA: Diagnosis not present

## 2023-05-14 DIAGNOSIS — M6289 Other specified disorders of muscle: Secondary | ICD-10-CM | POA: Diagnosis not present

## 2023-05-14 DIAGNOSIS — M62838 Other muscle spasm: Secondary | ICD-10-CM | POA: Diagnosis not present

## 2023-05-15 ENCOUNTER — Other Ambulatory Visit: Payer: Self-pay | Admitting: Physical Medicine and Rehabilitation

## 2023-05-15 DIAGNOSIS — I63132 Cerebral infarction due to embolism of left carotid artery: Secondary | ICD-10-CM

## 2023-05-15 DIAGNOSIS — I63412 Cerebral infarction due to embolism of left middle cerebral artery: Secondary | ICD-10-CM

## 2023-06-01 ENCOUNTER — Other Ambulatory Visit: Payer: Self-pay | Admitting: Physical Medicine and Rehabilitation

## 2023-06-01 MED ORDER — TOPIRAMATE 50 MG PO TABS: 50.0000 mg | ORAL_TABLET | Freq: Two times a day (BID) | ORAL | 3 refills | Status: DC

## 2023-06-05 ENCOUNTER — Other Ambulatory Visit: Payer: Self-pay | Admitting: Physical Medicine and Rehabilitation

## 2023-06-15 ENCOUNTER — Ambulatory Visit: Payer: Medicare Other | Admitting: Physical Medicine and Rehabilitation

## 2023-06-16 ENCOUNTER — Ambulatory Visit: Payer: Medicare Other | Admitting: Physical Medicine and Rehabilitation

## 2023-06-26 ENCOUNTER — Encounter: Payer: Self-pay | Admitting: Physical Medicine and Rehabilitation

## 2023-06-26 ENCOUNTER — Encounter
Payer: Federal, State, Local not specified - PPO | Attending: Physical Medicine and Rehabilitation | Admitting: Physical Medicine and Rehabilitation

## 2023-06-26 VITALS — BP 120/81 | HR 60 | Ht 60.0 in | Wt 135.4 lb

## 2023-06-26 DIAGNOSIS — M62838 Other muscle spasm: Secondary | ICD-10-CM | POA: Diagnosis not present

## 2023-06-26 DIAGNOSIS — R4701 Aphasia: Secondary | ICD-10-CM

## 2023-06-26 DIAGNOSIS — R29898 Other symptoms and signs involving the musculoskeletal system: Secondary | ICD-10-CM | POA: Diagnosis present

## 2023-06-26 NOTE — Progress Notes (Signed)
Subjective:    Patient ID: Chloe Johnson, Johnson    DOB: May 31, 1955, 68 y.o.   MRN: [id number]  HPI:   Chloe Johnson is a 68 y.o. Johnson who is here for f/u of right shoulder pain, Left Middle Cerebral Artery Stroke, Left Ventricular Thrombus and Essential Hypertension.   Dr. Iver Nestle H&P on 01/28/2022 HPI: Chloe Johnson is a 68 y.o. 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 Amada Jupiter, with key details confirmed by husband Chloe Johnson  is a 80 y.o.  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 She presents today after the following series events.  Around 12 PM this past Saturday, the patient noted sudden onset dizziness described as "vertigo" followed by nausea then followed by an episode of vomiting.  She next felt a sudden onset sharp chest pain with radiation to her back.  She assumed it was her cervical radiculopathy "acting up" because shortly after this event, she was holding 2 plates in her hand to feed her cats when she suddenly had no control of both upper extremities and dropped said plates.  On Sunday, she felt generally weak but then improved from all of this until this morning, around 9 AM, when she experienced word finding difficulties.  Her husband, who is at bedside, also notes she had a right facial droop transiently that lasted no more than a few minutes.  She may have had 2 subsequent episodes, as documented by ED  physician; however, all of her is told by the patient's husband that this was 1 single episode."   Her hospital course was noted for acute worsening for which she underwent thrombectomy and was discharged with only some very mild residual aphasia not detectable on examination but noticed by the patient per husband.  He notes that she has been in her normal state of health since discharge other than a visit to the ED on 9/3 with left shoulder pain which was attributed to her known cervical stenosis for which she is planned for surgery in the near future   Husband reports that she was taking his blood pressure when suddenly the blood pressure monitor fell off the table.  She tried to catch it but lost her balance had weakness in both of her legs.  Initially she got to both both arms but then became weak on the right side.  She was unable to speak at all suddenly during this event.  Work-up revealed a left ventricle apical thrombus.   Has been reports she has not missed any of her doses of medication including Eliquis   CTA was attempted to confirm recurrent acute occlusion, however due to IV infiltration and severity of her symptoms decision was made to proceed directly to IR which was discussed both with [name] as well as with the patient's husband   MR Brain:  IMPRESSION: 1. 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 2. No intracranial large vessel occlusion or significant stenosis. In particular, left MCA branches appear perfused and without focal stenosis.  MRA:  IMPRESSION:  1. 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 2. No intracranial large vessel occlusion or significant stenosis. In particular, left MCA branches appear perfused and without focal stenosis.  [name] COURSE Stroke:  left MCA punctate scattered infarct with left M2 occlusion s/p IR with TICI2c, embolic secondary to LV thrombus  recently on Eliquis CT no acute abnormality IR left superior M2 occlusion, inferior M3/M4 nonocclusive thrombus Status post EVT with TICI2c MRI left insular cortex and frontal lobe punctate infarcts MRA unremarkable 2D Echo EF 45 to 50% on 8/29, LV regional motion abnormality with LV thrombus LDL 150 on 8/29 HgbA1c 5 0 on 8/29 UDS positive for THC Hypercoagulable and autoimmune work up negative Heparin IV for VTE prophylaxis aspirin 81 mg daily and Eliquis (apixaban) daily prior to admission,  now on coumadin with lovenox bridge. INR goal 2.5 Continue ASA 81 per cardiology. Ongoing aggressive stroke risk factor management Therapy recommendations:  CIR Disposition:  pending   Hx of stroke and LV thrombus 8/29 admitted for nausea vomiting dizziness chest pain and then developed right facial droop and aphasia.  Found to have troponin > 300, EF 45 to 50% with LV thrombus.  CTA coronary artery showed minimal CAD.  CT no acute abnormality.  CTA head and neck showed left M2 occlusion.  CTP 0/7.  Status post EVT with TICI2c.  MRI showed left frontal [name] punctate infarct.  Repeat MRI showed left external capsule, insular cortex and frontal cortex punctate infarcts.  LDL 150, A1c 5.0.  UDS positive for THC.  Patient was put on heparin IV and aspirin and eventually discharged on Eliquis and aspirin as well as Crestor 20. Per family, patient has slight expressive aphasia since discharge, no physical deficit. Cardiology [name] on board, recommend coumadin with INR goal 2.5. on lovenox bridge.    Chloe Johnson was admitted to inpatient rehabilitation on 02/04/2022 and discharged home on 02/07/2022. She will begin outpatient Therapy on 02/25/2022 at [facility].  Chloe Johnson has expressive aphasia.  She was positive with THC, she reports she uses CBD products for anxiety, this was discussed with [name]. [name] stated she can continue with her CBD product, she  verbalizes understanding.   1) Right shoulder pain  -pain has been severe -she was not sure if she wanted to increase the topamax -she has been waking up and doing more since she went down on the lyrica -started suddenly when she was doing an activity (history is limited by her aphasia) -she feels this pain is more than just spasticity -it is very severe -she has been doing better with the increase in Lyrica to 200mg  BID -XR discussed and she would like to get this -she prefers nonsurgical treatment if possible.  -she discussed with Dr. Everardo Pacific and surgery was not recommended due to risk of re-tear -she is scheduled for steroid injection -she would be interested in starting topamax which we discussed before -pain has returned but she sees Dr. Everardo Pacific next week and she is going to ask for another injection -she thinks her shoulder is healing -positioning in car was causing the 10/10 of pain  2) Aphasia is greatly improving and she continues working with therapy -hyperbaric oxygen therapy has been helping- she plans to continue through April, she would like to do 100 sessions Dr. Wallace Cullens is writing a stroke protocol for her -he always talks to her after he has done the hyperbaric oxygen therapy but her  mouth is very dry -she is speaking so well today -long term disability was improved -not frustrated with speech but it is seems like it it shouldn't be this hard -she is still struggling -notes word finding difficuties -sometimes she feels frustrated as she can't find the thought she wants.  -she is going to be discharged from therapy on may 8th -she feels that she continues to improve every day -she slows down when she sees confusion on someone's face -does not feel she can return to work yet   3) UTI: -pyridium helped with her UTI symptoms  4) CHF -Entresto has improved  5) HTN -BP has improved -she is taking the spironolactone and valsartan  6) Atrial fibrillation: -she is so  happy to be transitioned to Eliquis and is happy she has no dietary restrictions  7) Overweight: -her diety has improved and weight has improved  8) CVA: -she has started getting the HBOT at [facility] and loves it -they have 2 HBOT set up there yet -you have to be able to step into it and be spry -in 5 minutes the chamber blows up around her  9) Right hand weakness: -addressed all her envelopes   Pain Inventory Average Pain 2 Pain Right Now 0 My pain is constant and aching  LOCATION OF PAIN  Back  BOWEL Number of stools per week: 14  Type of laxative Super Cleanse  BLADDER Normal  Mobility walk without assistance how many minutes can you walk? As needed ability to climb steps?  yes do you drive?  yes Do you have any goals in this area?  yes  Function disabled: date disabled 01/21/22  Neuro/Psych bladder control problems bowel control problems weakness numbness anxiety  Prior Studies Any changes since last visit?  no  Physicians involved in your care Any changes since last visit?  no   Family History  Problem Relation Age of Onset   Other Mother    Other Father    Hypertension Father    Cancer Sister    Sleep apnea Sister    Social History   Socioeconomic History   Marital status: Married    Spouse name: Leonette Most   Number of children: 3   Years of education: RN   Highest education level: Not on file  Occupational History    Employer: [employer]   Tobacco Use   Smoking status: Never    Passive exposure: Past   Smokeless tobacco: Never  Vaping Use   Vaping status: Never Used  Substance and Sexual Activity   Alcohol use: Yes    Alcohol/week: 2.0 standard drinks of alcohol    Types: 2 Shots of liquor per week    Comment: "a little"; 1/2 a shot x 3 per week   Drug use: No    Comment: takes CBD gummies for sleep and anxiety   Sexual activity: Yes    Birth control/protection: Post-menopausal  Other Topics Concern   Not  on file  Social History Narrative   Patient is married and lives at home with her husband Leonette Most).  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 Right handed.College education.         Social Drivers of Corporate investment banker Strain: Not on file  Food Insecurity: Not on file  Transportation Needs: Not on file  Physical Activity: Not on file  Stress: Not on file  Social Connections: Not on file   Past Surgical History:  Procedure Laterality Date   BREAST CYST EXCISION Left 2010   "polypectomy"   IR CT HEAD LTD  01/21/2022   IR CT HEAD LTD  01/28/2022   IR PERCUTANEOUS ART THROMBECTOMY/INFUSION INTRACRANIAL INC DIAG ANGIO  01/21/2022   IR PERCUTANEOUS ART THROMBECTOMY/INFUSION INTRACRANIAL INC DIAG ANGIO  01/28/2022   IR US GUIDE VASC ACCESS RIGHT  01/22/2022   PICC LINE PLACE PERIPHERAL ([facility] HX) Right    for use of Levaquin & Vancomycin, of note: she reports that she had a "flulike feeling"    RADIOLOGY WITH ANESTHESIA N/A 01/21/2022   Procedure: IR WITH ANESTHESIA;  Surgeon: Julieanne Cotton, MD;  Location: [facility] OR;  Service: Radiology;  Laterality: N/A;   RADIOLOGY WITH ANESTHESIA N/A 01/28/2022   Procedure: IR WITH ANESTHESIA;  Surgeon: Radiologist, Medication, MD;  Location: [facility] OR;  Service: Radiology;  Laterality: N/A;   TONSILLECTOMY AND ADENOIDECTOMY  1975   TUBAL LIGATION  1999   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/16/2013   L3-4/notes 11/24/2013, arms, neck   Exertional asthma    GERD (gastroesophageal reflux disease)    Heart attack (HCC) 01/18/2022   Hypertension    Kidney stones    "have always passed them"   Neck pain    Osteomyelitis (HCC) 11/23/2013   osteomyelitis, discitis    Sleep concern    uses Prozac for sleep    Stroke (HCC) 01/21/2022   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; left with aphasia, dysarthria, and  apraxia   BP 120/81   Pulse 60   Ht 5' (1.524 m)   Wt 135 lb 6.4 oz (61.4 kg)   SpO2 96%   BMI 26.44 kg/m   Opioid Risk Score:   Fall Risk Score:  `1  Depression screen [facility] 2/9     06/26/2023   11:45 AM 02/10/2023    2:31 PM 12/08/2022    2:35 PM 07/11/2022   10:19 AM 02/20/2022    9:22 AM 02/15/2014   11:06 AM 01/18/2014    2:15 PM  Depression screen PHQ 2/9  Decreased Interest 0 0 0 0 0 0 0  Down, Depressed, Hopeless 0 0 0 0 0 1 0  PHQ - 2 Score 0 0 0 0 0 1 0  Altered sleeping     0    Tired, decreased energy     1    Change in appetite     0    Feeling bad or failure about yourself      0    Trouble concentrating     0    Moving slowly or fidgety/restless     0    Suicidal thoughts     0    PHQ-9 Score     1    Difficult doing work/chores     Not difficult at all        Review of Systems  Constitutional: Negative.   HENT: Negative.    Eyes: Negative.   Respiratory: Negative.    Cardiovascular: Negative.   Gastrointestinal: Negative.   Endocrine: Negative.   Genitourinary: Negative.   Musculoskeletal:        Right shoulder  pain Wrist and hand pain   Skin: Negative.   Allergic/Immunologic: Negative.   Hematological:  Bruises/bleeds easily.       Apixaban  All other systems reviewed and  are negative.       Objective:  Gen: no distress, normal appearing HEENT: oral mucosa pink and moist, NCAT Cardio: Reg rate Chest: normal effort, normal rate of breathing Abd: soft, non-distended Ext: no edema Psych: pleasant, normal affect Skin: intact MSK: mild spasticity in right hand fingers. Sensation intact. Right hand strength greatly improved at 5/5!Marland Kitchen Improving speech rate ant intelligibility. Improving right shoulder range of motio     Assessment & Plan:  Left Middle Cerebral Artery Stroke: Continue current medication regimen. Has a scheduled appointment with Neurology. She will begin outpatient therapy at [facility]. Continue to Monitor.  Continue speech therapy to continue working on aphasia Discussed that she went to the stroke recovery meeting Referred to OT for vivstem referral Discussed dry needling of her right arm -discussed that ske has been using blue kyanite crystals Continue hyperbaric oxygen therapy, discussed her positive experiences with this Left Ventricular Thrombus: Continue current medication regimen. Cardiology Following.  Essential Hypertension. Continue current medication regimen. PCP Following. Continue to Monitor. Continue Valsartan Right shoulder pain: Discussed that MRI shows complete tear, referred to ortho Discussed extracorporeal shockwave therapy as a modality for treatment. Discussed that the device looks and feels like a massage gun and I would move it over the area of pain for about 10 minutes. The device releases sound waves to the area of pain and helps to improve blood flow and circulation to improve the healing process. Discuss that this initially induces inflammation and can sometimes cause short-term increase in pain. Discussed that we typically do three weekly treatments, but sometimes up to 6 if needed, and after 6 weeks long term benefits can sometimes be achieved.  Lyrica increased to 200mg  BID, discussed that she can continue this dose if she wants but given her unwanted weight gain we can alternatively replace her morning dose with low dose naltrexone. She preferred to try this latter option. -discussed mechanism of action of low dose naltrexone as an opioid receptor antagonist which stimulates your body's production of its own natural endogenous opioids, helping to decrease pain. Discussed that it can also decrease T cell response and thus be helpful in decreasing inflammation, and symptoms of brain fog, fatigue, anxiety, depression, and allergies. Discussed that this medication needs to be compounded at a compounding pharmacy and can more expensive. Discussed that I usually start at 1mg  and if  this is not providing enough relief then I titrate upward on a monthly basis.   Prescribed topamax to help improve sleep and pain, discussed that it can also curb appetite Encouraged follow-up with Dr. Everardo Pacific next week Discussed that she just received her right shoulder steroid injection  5. Overweight: -continue topamax 50mg  in the day and increase night time topamax to 75mg  -discussed her current diet -decrease Lyrica 100mg  daily  -Educated that current weight is 150 lbs, BMI 29.29 -Educated regarding health benefits of weight loss- for pain, general health, chronic disease prevention, immune health, mental health.  -Will monitor weight every visit.  -Consider Roobois tea daily.  -Discussed the benefits of intermittent fasting. -Discussed foods that can assist in weight loss: 1) leafy greens- high in fiber and nutrients 2) dark chocolate- improves metabolism (if prefer sweetened, best to sweeten with honey instead of sugar).  3) cruciferous vegetables- high in fiber and protein 4) full fat yogurt: high in healthy fat, protein, calcium, and probiotics 5) apples- high in a variety of phytochemicals 6) nuts- high in fiber and protein that increase feelings of fullness 7)  grapefruit: rich in nutrients, antioxidants, and fiber (not to be taken with anticoagulation) 8) beans- high in protein and fiber 9) salmon- has high quality protein and healthy fats 10) green tea- rich in polyphenols 11) eggs- rich in choline and vitamin D 12) tuna- high protein, boosts metabolism 13) avocado- decreases visceral abdominal fat 14) chicken (pasture raised): high in protein and iron 15) blueberries- reduce abdominal fat and cholesterol 16) whole grains- decreases calories retained during digestion, speeds metabolism 17) chia seeds- curb appetite 18) chilies- increases fat metabolism  -Discussed supplements that can be used:  1) Metatrim 400mg  BID 30 minutes before breakfast and dinner  2) Sphaeranthus  indicus and Garcinia mangostana (combinations of these and #1 can be found in capsicum and zychrome  3) green coffee bean extract 400mg  twice per day or Irvingia (african mango) 150 to 300mg  twice per day.   5. Right hand weakness:  -continue OT -discussed vivistim -discussed e-stim and that she bought her own tens unit -discussed that she started working with a hand specialist -discussed her interest in hand anatomy -provided video to learn how the MCA stroke affects hand strength.   6) Muscle spasms: -continue flexeril HS  7) UTI -previously prescribed macrobin 100mg  BID for 7 days -discussed UA/UC results -recommended staying well hydrated -recommended Uqora for improving vaginal microbiome  8) Lumbar spinal stenosis: -discussed that we can increase topamax to 75mg  at night and continue the 50mg  during the day, continue this regimen  9) Spider bites: -examined her right arm -discussed that she is trying to avoid benadryl -discussed that itching has improved  10) atrial fibrillation: -continue Eliquis -discussed that she is happy that she does not have dietary restrictions  11) Urinary incontinence: -discussed Inwave  12) Lesion on her right arm:  -discussed that she developed after using infrared light therapy -recommended manuka honey -continue neosporin  13) Aphasia:  Continue hyperbaric oxygen therapy until April -discussed her improvements with hyperbaric oxygen therapy  14. Right hand weakness: -discussed improvements with hyperbaric oxygen therapy -discussed her experiences with PT

## 2023-07-07 ENCOUNTER — Other Ambulatory Visit: Payer: Self-pay | Admitting: Physical Medicine and Rehabilitation

## 2023-07-19 ENCOUNTER — Other Ambulatory Visit: Payer: Self-pay | Admitting: Physical Medicine and Rehabilitation

## 2023-07-28 ENCOUNTER — Other Ambulatory Visit: Payer: Self-pay | Admitting: Physical Medicine and Rehabilitation

## 2023-07-28 DIAGNOSIS — I639 Cerebral infarction, unspecified: Secondary | ICD-10-CM

## 2023-07-29 ENCOUNTER — Telehealth: Payer: Self-pay | Admitting: Physical Medicine and Rehabilitation

## 2023-07-29 NOTE — Telephone Encounter (Signed)
 Patient need PT/OT extended sent to [facility] in [place].

## 2023-08-07 ENCOUNTER — Other Ambulatory Visit: Payer: Self-pay | Admitting: Physical Medicine and Rehabilitation

## 2023-08-07 DIAGNOSIS — R29898 Other symptoms and signs involving the musculoskeletal system: Secondary | ICD-10-CM

## 2023-08-07 DIAGNOSIS — I639 Cerebral infarction, unspecified: Secondary | ICD-10-CM

## 2023-09-07 ENCOUNTER — Other Ambulatory Visit: Payer: Self-pay | Admitting: Physical Medicine and Rehabilitation

## 2023-09-14 ENCOUNTER — Other Ambulatory Visit: Payer: Self-pay | Admitting: Cardiovascular Disease

## 2023-09-14 DIAGNOSIS — I5181 Takotsubo syndrome: Secondary | ICD-10-CM

## 2023-09-14 DIAGNOSIS — I5032 Chronic diastolic (congestive) heart failure: Secondary | ICD-10-CM

## 2023-09-14 DIAGNOSIS — R0600 Dyspnea, unspecified: Secondary | ICD-10-CM

## 2023-09-14 DIAGNOSIS — Z79899 Other long term (current) drug therapy: Secondary | ICD-10-CM

## 2023-09-17 ENCOUNTER — Encounter: Attending: Physical Medicine and Rehabilitation | Admitting: Physical Medicine and Rehabilitation

## 2023-09-17 DIAGNOSIS — R29898 Other symptoms and signs involving the musculoskeletal system: Secondary | ICD-10-CM | POA: Diagnosis not present

## 2023-09-17 NOTE — Addendum Note (Signed)
 Addended by: Liam Redhead on: 09/17/2023 12:10 PM   Modules accepted: Level of Service

## 2023-09-17 NOTE — Progress Notes (Signed)
 Subjective:    Patient ID: Chloe Johnson, female    DOB: 26-Sep-1955, 68 y.o.   MRN: [id number]  HPI:   Chloe Johnson is a 68 y.o. female who is here for f/u of right shoulder pain, Left Middle Cerebral Artery Stroke, Left Ventricular Thrombus and Essential Hypertension.   Dr. Cleone Johnson H&P on 01/28/2022 HPI: Chloe Johnson is a 68 y.o. 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 Alecia Johnson, with key details confirmed by husband Chloe Johnson  is a 53 y.o.  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 She presents today after the following series events.  Around 12 PM this past Saturday, the patient noted sudden onset dizziness described as "vertigo" followed by nausea then followed by an episode of vomiting.  She next felt a sudden onset sharp chest pain with radiation to her back.  She assumed it was her cervical radiculopathy "acting up" because shortly after this event, she was holding 2 plates in her hand to feed her cats when she suddenly had no control of both upper extremities and dropped said plates.  On Sunday, she felt generally weak but then improved from all of this until this morning, around 9 AM, when she experienced word finding difficulties.  Her husband, who is at bedside, also notes she had a right facial droop transiently that lasted no more than a few minutes.  She may have had 2 subsequent episodes, as documented by ED  physician; however, all of her is told by the patient's husband that this was 1 single episode."   Her hospital course was noted for acute worsening for which she underwent thrombectomy and was discharged with only some very mild residual aphasia not detectable on examination but noticed by the patient per husband.  He notes that she has been in her normal state of health since discharge other than a visit to the ED on 9/3 with left shoulder pain which was attributed to her known cervical stenosis for which she is planned for surgery in the near future   Husband reports that she was taking his blood pressure when suddenly the blood pressure monitor fell off the table.  She tried to catch it but lost her balance had weakness in both of her legs.  Initially she got to both both arms but then became weak on the right side.  She was unable to speak at all suddenly during this event.  Work-up revealed a left ventricle apical thrombus.   Has been reports she has not missed any of her doses of medication including Eliquis    CTA was attempted to confirm recurrent acute occlusion, however due to IV infiltration and severity of her symptoms decision was made to proceed directly to IR which was discussed both with Dr. Alvira Chloe Johnson as well as with the patient's husband   MR Brain:  IMPRESSION: 1. 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 2. No intracranial large vessel occlusion or significant stenosis. In particular, left MCA branches appear perfused and without focal stenosis.  MRA:  IMPRESSION:  1. 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 2. No intracranial large vessel occlusion or significant stenosis. In particular, left MCA branches appear perfused and without focal stenosis.  Dr. Herschell Johnson COURSE Stroke:  left MCA punctate scattered infarct with left M2 occlusion s/p IR with TICI2c, embolic secondary to LV thrombus  recently on Eliquis  CT no acute abnormality IR left superior M2 occlusion, inferior M3/M4 nonocclusive thrombus Status post EVT with TICI2c MRI left insular cortex and frontal lobe punctate infarcts MRA unremarkable 2D Echo EF 45 to 50% on 8/29, LV regional motion abnormality with LV thrombus LDL 150 on 8/29 HgbA1c 5 0 on 8/29 UDS positive for THC Hypercoagulable and autoimmune work up negative Heparin  IV for VTE prophylaxis aspirin  81 mg daily and Eliquis  (apixaban ) daily prior to admission,  now on coumadin  with lovenox  bridge. INR goal 2.5 Continue ASA 81 per cardiology. Ongoing aggressive stroke risk factor management Therapy recommendations:  CIR Disposition:  pending   Hx of stroke and LV thrombus 8/29 admitted for nausea vomiting dizziness chest pain and then developed right facial droop and aphasia.  Found to have troponin > 300, EF 45 to 50% with LV thrombus.  CTA coronary artery showed minimal CAD.  CT no acute abnormality.  CTA head and neck showed left M2 occlusion.  CTP 0/7.  Status post EVT with TICI2c.  MRI showed left frontal Frese matter punctate infarct.  Repeat MRI showed left external capsule, insular cortex and frontal cortex punctate infarcts.  LDL 150, A1c 5.0.  UDS positive for THC.  Patient was put on heparin  IV and aspirin  and eventually discharged on Eliquis  and aspirin  as well as Crestor  20. Per family, patient has slight expressive aphasia since discharge, no physical deficit. Cardiology Dr. Stann Johnson on board, recommend coumadin  with INR goal 2.5. on lovenox  bridge.    Chloe Johnson was admitted to inpatient rehabilitation on 02/04/2022 and discharged home on 02/07/2022. She will begin outpatient Therapy on 02/25/2022 at [facility].  Chloe Johnson has expressive aphasia.  She was positive with THC, she reports she uses CBD products for anxiety, this was discussed with Dr Chloe Johnson. Dr Chloe Johnson stated she can continue with her CBD product, she  verbalizes understanding.   1) Right shoulder pain  -pain has been severe -she was not sure if she wanted to increase the topamax  -she has been waking up and doing more since she went down on the lyrica  -started suddenly when she was doing an activity (history is limited by her aphasia) -she feels this pain is more than just spasticity -it is very severe -she has been doing better with the increase in Lyrica  to 200mg  BID -XR discussed and she would like to get this -she prefers nonsurgical treatment if possible.  -she discussed with Dr. Yvonne Hering and surgery was not recommended due to risk of re-tear -she is scheduled for steroid injection -she would be interested in starting topamax  which we discussed before -pain has returned but she sees Dr. Yvonne Hering next week and she is going to ask for another injection -she thinks her shoulder is healing -positioning in car was causing the 10/10 of pain  2) Aphasia is greatly improving and she continues working with therapy -hyperbaric oxygen therapy has been helping- she plans to continue through April, she would like to do 100 sessions Dr. Martina Sledge is writing a stroke protocol for her -he always talks to her after he has done the hyperbaric oxygen therapy but her  mouth is very dry -she is speaking so well today -long term disability was improved -not frustrated with speech but it is seems like it it shouldn't be this hard -she is still struggling -notes word finding difficuties -sometimes she feels frustrated as she can't find the thought she wants.  -she is going to be discharged from therapy on may 8th -she feels that she continues to improve every day -she slows down when she sees confusion on someone's face -does not feel she can return to work yet   3) UTI: -pyridium helped with her UTI symptoms  4) CHF -Entresto  has improved  5) HTN -BP has improved -she is taking the spironolactone  and valsartan   6) Atrial fibrillation: -she is so  happy to be transitioned to Eliquis  and is happy she has no dietary restrictions  7) Overweight: -her diety has improved and weight has improved  8) CVA: -she has started getting the HBOT at [facility] and loves it -they have 2 HBOT set up there yet -you have to be able to step into it and be spry -in 5 minutes the chamber blows up around her  9) Right hand weakness: -addressed all her envelopes -getting better  Pain Inventory Average Pain 2 Pain Right Now 0 My pain is constant and aching  LOCATION OF PAIN  Back  BOWEL Number of stools per week: 14  Type of laxative Super Cleanse  BLADDER Normal  Mobility walk without assistance how many minutes can you walk? As needed ability to climb steps?  yes do you drive?  yes Do you have any goals in this area?  yes  Function disabled: date disabled 01/21/22  Neuro/Psych bladder control problems bowel control problems weakness numbness anxiety  Prior Studies Any changes since last visit?  no  Physicians involved in your care Any changes since last visit?  no   Family History  Problem Relation Age of Onset   Other Mother    Other Father    Hypertension Father    Cancer Sister    Sleep apnea Sister    Social History   Socioeconomic History   Marital status: Married    Spouse name: Chloe Johnson   Number of children: 3   Years of education: RN   Highest education level: Not on file  Occupational History    Employer: [employer]   Tobacco Use   Smoking status: Never    Passive exposure: Past   Smokeless tobacco: Never  Vaping Use   Vaping status: Never Used  Substance and Sexual Activity   Alcohol use: Yes    Alcohol/week: 2.0 standard drinks of alcohol    Types: 2 Shots of liquor per week    Comment: "a little"; 1/2 a shot x 3 per week   Drug use: No    Comment: takes CBD gummies for sleep and anxiety   Sexual activity: Yes    Birth control/protection: Post-menopausal  Other Topics  Concern   Not on file  Social History Narrative   Patient is married and lives at home with her husband Chloe Johnson).  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 Right handed.College education.         Social Drivers of Corporate investment banker Strain: Not on file  Food Insecurity: Not on file  Transportation Needs: Not on file  Physical Activity: Not on file  Stress: Not on  file  Social Connections: Not on file   Past Surgical History:  Procedure Laterality Date   BREAST CYST EXCISION Left 2010   "polypectomy"   IR CT HEAD LTD  01/21/2022   IR CT HEAD LTD  01/28/2022   IR PERCUTANEOUS ART THROMBECTOMY/INFUSION INTRACRANIAL INC DIAG ANGIO  01/21/2022   IR PERCUTANEOUS ART THROMBECTOMY/INFUSION INTRACRANIAL INC DIAG ANGIO  01/28/2022   IR US  GUIDE VASC ACCESS RIGHT  01/22/2022   PICC LINE PLACE PERIPHERAL ([facility] HX) Right    for use of Levaquin  & Vancomycin , of note: she reports that she had a "flulike feeling"    RADIOLOGY WITH ANESTHESIA N/A 01/21/2022   Procedure: IR WITH ANESTHESIA;  Surgeon: Luellen Sages, MD;  Location: [facility] OR;  Service: Radiology;  Laterality: N/A;   RADIOLOGY WITH ANESTHESIA N/A 01/28/2022   Procedure: IR WITH ANESTHESIA;  Surgeon: Radiologist, Medication, MD;  Location: [facility] OR;  Service: Radiology;  Laterality: N/A;   TONSILLECTOMY AND ADENOIDECTOMY  1975   TUBAL LIGATION  1999   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/16/2013   L3-4/notes 11/24/2013, arms, neck   Exertional asthma    GERD (gastroesophageal reflux disease)    Heart attack (HCC) 01/18/2022   Hypertension    Kidney stones    "have always passed them"   Neck pain    Osteomyelitis (HCC) 11/23/2013   osteomyelitis, discitis    Sleep concern    uses Prozac  for sleep    Stroke (HCC) 01/21/2022   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; left with aphasia,  dysarthria, and apraxia   There were no vitals taken for this visit.  Opioid Risk Score:   Fall Risk Score:  `1  Depression screen [facility] 2/9     06/26/2023   11:45 AM 02/10/2023    2:31 PM 12/08/2022    2:35 PM 07/11/2022   10:19 AM 02/20/2022    9:22 AM 02/15/2014   11:06 AM 01/18/2014    2:15 PM  Depression screen [facility] 2/9  Decreased Interest 0 0 0 0 0 0 0  Down, Depressed, Hopeless 0 0 0 0 0 1 0  PHQ - 2 Score 0 0 0 0 0 1 0  Altered sleeping     0    Tired, decreased energy     1    Change in appetite     0    Feeling bad or failure about yourself      0    Trouble concentrating     0    Moving slowly or fidgety/restless     0    Suicidal thoughts     0    PHQ-9 Score     1    Difficult doing work/chores     Not difficult at all        Review of Systems  Constitutional: Negative.   HENT: Negative.    Eyes: Negative.   Respiratory: Negative.    Cardiovascular: Negative.   Gastrointestinal: Negative.   Endocrine: Negative.   Genitourinary: Negative.   Musculoskeletal:        Right shoulder  pain Wrist and hand pain   Skin: Negative.   Allergic/Immunologic: Negative.   Hematological:  Bruises/bleeds easily.       Apixaban   All other systems reviewed and are negative.       Objective:   PRIOR EXAM: Gen: no distress, normal appearing HEENT: oral mucosa  pink and moist, NCAT Cardio: Reg rate Chest: normal effort, normal rate of breathing Abd: soft, non-distended Ext: no edema Psych: pleasant, normal affect Skin: intact MSK: mild spasticity in right hand fingers. Sensation intact. Right hand strength greatly improved at 5/5!Aaron Aas Improving speech rate ant intelligibility. Improving right shoulder range of motio     Assessment & Plan:  Left Middle Cerebral Artery Stroke: Continue current medication regimen. Has a scheduled appointment with Neurology. She will begin outpatient therapy at [facility]. Continue to Monitor. Continue speech therapy to  continue working on aphasia Discussed that she went to the stroke recovery meeting Referred to OT for vivstem referral Discussed dry needling of her right arm -discussed that ske has been using blue kyanite crystals Continue hyperbaric oxygen therapy, discussed her positive experiences with this Left Ventricular Thrombus: Continue current medication regimen. Cardiology Following.  Essential Hypertension. Continue current medication regimen. PCP Following. Continue to Monitor. Continue Valsartan  Right shoulder pain: Discussed that MRI shows complete tear, referred to ortho Discussed extracorporeal shockwave therapy as a modality for treatment. Discussed that the device looks and feels like a massage gun and I would move it over the area of pain for about 10 minutes. The device releases sound waves to the area of pain and helps to improve blood flow and circulation to improve the healing process. Discuss that this initially induces inflammation and can sometimes cause short-term increase in pain. Discussed that we typically do three weekly treatments, but sometimes up to 6 if needed, and after 6 weeks long term benefits can sometimes be achieved.  Lyrica  increased to 200mg  BID, discussed that she can continue this dose if she wants but given her unwanted weight gain we can alternatively replace her morning dose with low dose naltrexone. She preferred to try this latter option. -discussed mechanism of action of low dose naltrexone as an opioid receptor antagonist which stimulates your body's production of its own natural endogenous opioids, helping to decrease pain. Discussed that it can also decrease T cell response and thus be helpful in decreasing inflammation, and symptoms of brain fog, fatigue, anxiety, depression, and allergies. Discussed that this medication needs to be compounded at a compounding pharmacy and can more expensive. Discussed that I usually start at 1mg  and if this is not providing  enough relief then I titrate upward on a monthly basis.   Prescribed topamax  to help improve sleep and pain, discussed that it can also curb appetite Encouraged follow-up with Dr. Yvonne Hering next week Discussed that she just received her right shoulder steroid injection  5. Overweight: -continue topamax  50mg  in the day and increase night time topamax  to 75mg  -discussed her current diet -decrease Lyrica  100mg  daily  -Educated that current weight is 150 lbs, BMI 29.29 -Educated regarding health benefits of weight loss- for pain, general health, chronic disease prevention, immune health, mental health.  -Will monitor weight every visit.  -Consider Roobois tea daily.  -Discussed the benefits of intermittent fasting. -Discussed foods that can assist in weight loss: 1) leafy greens- high in fiber and nutrients 2) dark chocolate- improves metabolism (if prefer sweetened, best to sweeten with honey instead of sugar).  3) cruciferous vegetables- high in fiber and protein 4) full fat yogurt: high in healthy fat, protein, calcium , and probiotics 5) apples- high in a variety of phytochemicals 6) nuts- high in fiber and protein that increase feelings of fullness 7) grapefruit: rich in nutrients, antioxidants, and fiber (not to be taken with anticoagulation) 8) beans- high in protein  and fiber 9) salmon- has high quality protein and healthy fats 10) green tea- rich in polyphenols 11) eggs- rich in choline and vitamin D 12) tuna- high protein, boosts metabolism 13) avocado- decreases visceral abdominal fat 14) chicken (pasture raised): high in protein and iron 15) blueberries- reduce abdominal fat and cholesterol 16) whole grains- decreases calories retained during digestion, speeds metabolism 17) chia seeds- curb appetite 18) chilies- increases fat metabolism  -Discussed supplements that can be used:  1) Metatrim 400mg  BID 30 minutes before breakfast and dinner  2) Sphaeranthus indicus and Garcinia  mangostana (combinations of these and #1 can be found in capsicum and zychrome  3) green coffee bean extract 400mg  twice per day or Irvingia (african mango) 150 to 300mg  twice per day.   5. Right hand weakness:  -continue OT -discussed vivistim -discussed e-stim and that she bought her own tens unit -discussed that she started working with a hand specialist -discussed her interest in hand anatomy -provided video to learn how the MCA stroke affects hand strength.   6) Muscle spasms: -continue flexeril  HS  7) UTI -previously prescribed macrobin 100mg  BID for 7 days -discussed UA/UC results -recommended staying well hydrated -recommended Uqora for improving vaginal microbiome  8) Lumbar spinal stenosis: -discussed that we can increase topamax  to 75mg  at night and continue the 50mg  during the day, continue this regimen  9) Spider bites: -examined her right arm -discussed that she is trying to avoid benadryl  -discussed that itching has improved  10) atrial fibrillation: -continue Eliquis  -discussed that she is happy that she does not have dietary restrictions  11) Urinary incontinence: -discussed Inwave  12) Lesion on her right arm:  -discussed that she developed after using infrared light therapy -recommended manuka honey -continue neosporin  13) Aphasia:  Continue hyperbaric oxygen therapy until April -discussed her improvements with hyperbaric oxygen therapy  14. Right hand weakness: -discussed improvements with hyperbaric oxygen therapy -discussed her experiences with PT -discussed her right hand weakness and aphasia and how these limit her ability to return to work, discussed that she has an eval with her chiropractor for laser therapy to help with her spasticity  5 minutes spent in discussing her right hand weakness and aphasia and how these limit her ability to return to work, discussed that she has an eval with her chiropractor for laser therapy to help with her  spasticity

## 2023-09-22 ENCOUNTER — Other Ambulatory Visit: Payer: Self-pay | Admitting: Cardiovascular Disease

## 2023-09-22 DIAGNOSIS — Z79899 Other long term (current) drug therapy: Secondary | ICD-10-CM

## 2023-09-22 DIAGNOSIS — I5032 Chronic diastolic (congestive) heart failure: Secondary | ICD-10-CM

## 2023-09-22 DIAGNOSIS — I5181 Takotsubo syndrome: Secondary | ICD-10-CM

## 2023-09-22 DIAGNOSIS — R0600 Dyspnea, unspecified: Secondary | ICD-10-CM

## 2023-10-04 NOTE — Progress Notes (Unsigned)
 Cardiology Office Note:    Date:  10/05/2023   ID:  Chloe Johnson, DOB 01-06-1956, MRN [id number]  PCP:  Tena Feeling, MD   [facility] Providers Cardiologist: Zohar Maroney    Referring MD: Tena Feeling, MD   Chief Complaint  Patient presents with   Cerebrovascular Accident     History of Present Illness:    Chloe Johnson is a 68 y.o. female with a hx of  ? 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  She was thought to have had an eliquis  failure and  She has now been transitioned to coumadin  /Lovenox . I actually think the issues was that she just had not been on any anticoagulant long enough to start dissolving the thrombus   Has an expressive aphasia  Improving with rehab. Improving daily   INR followed at [facility]  Last INR is 1.3   Oct. 16, 2023 Chloe Johnson is seen for follow up of her CHF, LV thrombus and stroke Echo several days ago shows normal LV function without mention of segmental wall motion abn. Mild MR, trivial TR  Mild PI   Her last INR is 4.3 on Oct. 12, 2023  Managed by our coumadin  clinic   Oct 01, 2022 Chloe Johnson is seen for follow up of her CHF, LV thrombus, and stroke  She wants to stop warfarin and start Eliquis   She has seen Dr. Maria Shiner and see if Eliquis  will be adequate for her factor II abnormalty   She thinks her entresto  is causing her hypokalemia and she is not able too tolerate the Kdur ( stomach issues Wants to try valsartan  instead   Also would like to stop her toprol  XL ( since her LV function has improved ) I would like to continue the Toprol  XL for now   December 22, 2022  Chloe Johnson is seen for follow up of her Takotsubo syndrome,  CHF, LV thrombus, stroke She originally presented with neuro/ stroke symptoms   We changed her from warfarin to Eliquis  at her last visit  We  ordered PFTs for further evaluation of her dyspnea ,  she did not get a  Is doing hyperbaric oxygen therapy   Still has not received the newest pneumonia vaccine     Husband [name] developed extensive DVT and PE Memorial day weekend .  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  Is not going to return to work . Has long term disability   Oct. 29, 2024 Chloe Johnson is seen for follow up visit  Going to a chiropractic for hyperbaric chamber  3 times a week .  She is making progress  Also red light therapy Her speech is getting better  Still has some aphasia but she is clearly improving   Still on Eliquis    Walks for short distances  Walks around the house , does not need cane or walker She blows the leaves at her house  - walking very well   ADDENDUM 04/30/23  Pt was seen by Dr. Verdis Glade for genetic hypercoagulability evaluation   Chloe Johnson was seen virtually for her post-test genetic consult of inherited hypercoagulable conditions. Her identity was confirmed with two unique identifiers.   I informed her that the genetic test for inherited hypercoagulable states screened for Factor V Leiden mutation, prothrombin (F2 c.*97G>A) mutation or deficiencies in protein  C and protein S activity. 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   Explained to her that her mutations in F2 gene and Protein S inhibit the anticoagulation cascade, thus leading to a higher incidence of blood clots in association with other risk factors including surgery or increased immobility from travel or hospitalizations. It is highly likely that this genotype increased her risk of having a stroke. She verbalized understanding.   It is recommended that her daughters, ages 11, 11 and son age 61 undergo genetic testing for F2 mutation as well as determine their protein S activity levels. She tells me that one daughter live sin [place]  and the other two children are in [place]. She will discuss her results with them so they can get undergo screening. Also advised her to see a hematologist so that she can be managed and treated appropriately. She already has an appointment on 05/05/22  to see Dr. Maria Shiner.   Verdis Glade, Ph.D, [facility] Clinical Molecular Geneticis    Oct 05, 2023  Chloe Johnson is seen for follow up of her takotsuby syndrome , LV thrombus,  CVA She has made progress with her hand strength. Still falls on occasion   Is on coreg . Spiro, valsartan  80  Her last potassium level was 4.2   Ldl is 95 CAC score is 0 Minimal CAD   She would like to follow up with Dr. Veryl Gottron     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/16/2013   L3-4/notes 11/24/2013, arms, neck   Exertional asthma    GERD (gastroesophageal reflux disease)    Heart attack (HCC) 01/18/2022   Hypertension    Kidney stones    "have always passed them"   Neck pain    Osteomyelitis (HCC) 11/23/2013   osteomyelitis, discitis    Sleep concern    uses Prozac  for sleep    Stroke (HCC) 01/21/2022   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; left with aphasia, dysarthria, and apraxia    Past Surgical History:  Procedure Laterality Date   BREAST CYST EXCISION Left 2010   "polypectomy"   IR CT HEAD LTD  01/21/2022   IR CT HEAD LTD  01/28/2022   IR PERCUTANEOUS ART THROMBECTOMY/INFUSION INTRACRANIAL INC DIAG ANGIO  01/21/2022   IR PERCUTANEOUS ART THROMBECTOMY/INFUSION INTRACRANIAL INC DIAG ANGIO  01/28/2022   IR US  GUIDE VASC ACCESS RIGHT  01/22/2022   PICC LINE PLACE PERIPHERAL ([facility] HX) Right    for use of Levaquin  & Vancomycin , of note: she reports that she had a "flulike feeling"    RADIOLOGY WITH ANESTHESIA N/A 01/21/2022   Procedure: IR WITH ANESTHESIA;  Surgeon: Luellen Sages, MD;  Location: [facility] OR;  Service: Radiology;  Laterality: N/A;   RADIOLOGY WITH ANESTHESIA N/A 01/28/2022   Procedure: IR  WITH ANESTHESIA;  Surgeon: Radiologist, Medication, MD;  Location: [facility] OR;  Service: Radiology;  Laterality: N/A;   TONSILLECTOMY AND ADENOIDECTOMY  1975   TUBAL LIGATION  1999    Current Medications: Current Meds  Medication Sig   acetaminophen  (TYLENOL ) 325 MG tablet Take 2 tablets (650 mg total) by mouth every 4 (four) hours as needed for mild pain (or temp > 37.5 C (99.5 F)). (Patient taking differently: Take 650 mg by mouth 2 (two) times daily.)   carvedilol  (COREG ) 3.125 MG tablet Take 1 tablet (3.125 mg total) by  mouth 2 (two) times daily.   cyclobenzaprine  (FLEXERIL ) 10 MG tablet TAKE ONE TABLET AT BEDTIME   ELIQUIS  5 MG TABS tablet Take 1 tablet (5 mg total) by mouth 2 (two) times daily.   famotidine  (PEPCID ) 20 MG tablet TAKE ONE TABLET BY MOUTH TWICE DAILY   loratadine  (CLARITIN ) 10 MG tablet Take 1 tablet (10 mg total) by mouth daily.   mirabegron  ER (MYRBETRIQ ) 50 MG TB24 tablet Take 50 mg by mouth daily.   Naltrexone HCl, Pain, 1.5 MG CAPS Take 1.5 mg by mouth daily at 6 (six) AM.   potassium chloride  (KLOR-CON ) 10 MEQ tablet Take 1 tablet (10 mEq total) by mouth 2 (two) times daily.   pregabalin  (LYRICA ) 100 MG capsule Take 1 capsule (100 mg total) by mouth daily.   pregabalin  (LYRICA ) 150 MG capsule Take 1 capsule (150 mg total) by mouth at bedtime.   rosuvastatin  (CRESTOR ) 20 MG tablet TAKE ONE TABLET BY MOUTH DAILY   spironolactone  (ALDACTONE ) 25 MG tablet Take 1/2 tablet (12.5 mg total) by mouth daily.   topiramate  (TOPAMAX ) 25 MG tablet Take 1 tablet (25 mg total) by mouth daily as needed.   topiramate  (TOPAMAX ) 50 MG tablet Take 1 tablet (50 mg total) by mouth 2 (two) times daily.   valsartan  (DIOVAN ) 80 MG tablet Take 1 tablet (80 mg total) by mouth daily.     Allergies:   Doxycycline , Erythromycin, Iodine, Other, Vancomycin , Vicodin [hydrocodone-acetaminophen ], Dilaudid  [hydromorphone  hcl], Keflex [cephalexin], Penicillins, and Sulfa antibiotics   Social History    Socioeconomic History   Marital status: Married    Spouse name: Charles   Number of children: 3   Years of education: RN   Highest education level: Not on file  Occupational History    Employer: [employer]   Tobacco Use   Smoking status: Never    Passive exposure: Past   Smokeless tobacco: Never  Vaping Use   Vaping status: Never Used  Substance and Sexual Activity   Alcohol use: Yes    Alcohol/week: 2.0 standard drinks of alcohol    Types: 2 Shots of liquor per week    Comment: "a little"; 1/2 a shot x 3 per week   Drug use: No    Comment: takes CBD gummies for sleep and anxiety   Sexual activity: Yes    Birth control/protection: Post-menopausal  Other Topics Concern   Not on file  Social History Narrative   Patient is married and lives at home with her husband Ethelle [name]).  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 Right handed.College education.         Social Drivers of Corporate investment banker Strain: Not on file  Food Insecurity: Not on file  Transportation Needs: Not on file  Physical Activity: Not on file  Stress: Not on file  Social Connections: Not on file     Family History: The patient's family history includes Cancer in her sister; Hypertension in her father; Other in her father and mother; Sleep apnea in her sister.  ROS:   Please see the history of present illness.     All other systems reviewed and are negative.  EKGs/Labs/Other Studies Reviewed:    The following studies were reviewed today:   EKG:    EKG Interpretation Date/Time:  Monday Oct 05 2023 14:06:08 EDT Ventricular Rate:  56 PR Interval:  144 QRS Duration:  78 QT Interval:  396 QTC Calculation: 382 R Axis:   62  Text Interpretation: Sinus bradycardia with Premature ventricular complexes or Fusion complexes When compared with ECG of 26-Jan-2022 10:57, Fusion complexes are  now Present Premature ventricular complexes are now Present Nonspecific T wave abnormality no longer evident in Inferior leads T wave inversion no longer evident in Lateral leads Confirmed by Ahmad Alert ([id number]) on 10/05/2023 2:46:34 PM    Recent Labs: 10/17/2022: BUN 13; Creatinine, Ser 0.81; Potassium 3.9; Sodium 141  Recent Lipid Panel    Component Value Date/Time   CHOL 223 (H) 01/22/2022 0351   TRIG 271 (H) 01/29/2022 0300   HDL 40 (L) 01/22/2022 0351   CHOLHDL 5.6 01/22/2022 0351   VLDL 33 01/22/2022 0351   LDLCALC 150 (H) 01/22/2022 0351     Risk Assessment/Calculations:       Physical Exam:      Physical Exam: Blood pressure 126/72, pulse (!) 56, height 5' (1.524 m), weight 140 lb (63.5 kg), SpO2 99%.       GEN:  Well nourished, well developed in no acute distress HEENT: Normal NECK: No JVD; No carotid bruits LYMPHATICS: No lymphadenopathy CARDIAC: RRR , no murmurs, rubs, gallops RESPIRATORY:  Clear to auscultation without rales, wheezing or rhonchi  ABDOMEN: Soft, non-tender, non-distended MUSCULOSKELETAL:  No edema; No deformity  SKIN: Warm and dry NEUROLOGIC:  Alert and oriented x 3    ASSESSMENT:    1. Primary hypertension   2. Mixed hyperlipidemia   3. Takotsubo cardiomyopathy         PLAN:        Chronic systolic CHF :  presented with Takotsubo syndrome, LV thrombus and a stroke .    Continue current meds.       2. LV thrombus: she has a factor II mutation that is the cause of her hypercoaguability  She will get her family screened    3.  Stroke:    continue eliquis    ADDENDUM 04/30/23  Pt was seen by Dr. Verdis Glade for genetic hypercoagulability evaluation   Chloe Johnson was seen virtually for her post-test genetic consult of inherited hypercoagulable conditions. Her identity was confirmed with two unique identifiers.   I informed her that the genetic test for inherited hypercoagulable states screened for Factor V Leiden  mutation, prothrombin (F2 c.*97G>A) mutation or deficiencies in protein C and protein S activity. 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   Explained to her that her mutations in F2 gene and Protein S inhibit the anticoagulation cascade, thus leading to a higher incidence of blood clots in association with other risk factors including surgery or increased immobility from travel or hospitalizations. It is highly likely that this genotype increased her risk of having a stroke. She verbalized understanding.   It is recommended that her daughters, ages 43, 26 and son age 31 undergo genetic testing for F2 mutation as well as determine their protein S activity levels. She tells me that one daughter live sin [place] and the other two children are in [place]. She will discuss her results with them so they can get undergo screening. Also advised her to see a hematologist so that she can be managed and treated appropriately. She already has an appointment on 05/05/22  to see Dr. Maria Shiner.   Verdis Glade, Ph.D, [facility] Clinical Molecular Geneticis      .4.  Dyspnea:  Medication Adjustments/Labs and Tests Ordered: Current medicines are reviewed at length with the patient today.  Concerns regarding medicines are outlined above.  Orders Placed This Encounter  Procedures   EKG 12-Lead   No orders of the defined types were placed in this encounter.   Patient Instructions  Follow-Up: At [facility], you and your health needs are our priority.  As part of our continuing mission to provide you with exceptional heart care, our providers are all part of one team.  This team includes your primary Cardiologist (physician) and Advanced Practice Providers or APPs (Physician Assistants and Nurse Practitioners) who all work together to provide you with the care you need, when you need it.  Your next appointment:   6  month(s)  Provider:   Sheryle Donning, MD     Signed, Ahmad Alert, MD  10/05/2023 2:46 PM    [facility]

## 2023-10-05 ENCOUNTER — Encounter: Payer: Self-pay | Admitting: Cardiovascular Disease

## 2023-10-05 ENCOUNTER — Ambulatory Visit: Attending: Cardiovascular Disease | Admitting: Cardiovascular Disease

## 2023-10-05 VITALS — BP 126/72 | HR 56 | Ht 60.0 in | Wt 140.0 lb

## 2023-10-05 DIAGNOSIS — E782 Mixed hyperlipidemia: Secondary | ICD-10-CM

## 2023-10-05 DIAGNOSIS — I10 Essential (primary) hypertension: Secondary | ICD-10-CM

## 2023-10-05 DIAGNOSIS — I5181 Takotsubo syndrome: Secondary | ICD-10-CM

## 2023-10-05 NOTE — Patient Instructions (Signed)
 Follow-Up: At [facility], you and your health needs are our priority.  As part of our continuing mission to provide you with exceptional heart care, our providers are all part of one team.  This team includes your primary Cardiologist (physician) and Advanced Practice Providers or APPs (Physician Assistants and Nurse Practitioners) who all work together to provide you with the care you need, when you need it.  Your next appointment:   6 month(s)  Provider:   Sheryle Donning, MD

## 2023-10-23 ENCOUNTER — Encounter: Payer: Self-pay | Admitting: Physical Medicine and Rehabilitation

## 2023-10-23 ENCOUNTER — Encounter
Payer: Federal, State, Local not specified - PPO | Attending: Physical Medicine and Rehabilitation | Admitting: Physical Medicine and Rehabilitation

## 2023-10-23 VITALS — BP 111/72 | HR 61 | Ht 60.0 in | Wt 141.8 lb

## 2023-10-23 DIAGNOSIS — M25511 Pain in right shoulder: Secondary | ICD-10-CM | POA: Diagnosis not present

## 2023-10-23 DIAGNOSIS — R4701 Aphasia: Secondary | ICD-10-CM

## 2023-10-23 DIAGNOSIS — R600 Localized edema: Secondary | ICD-10-CM | POA: Diagnosis not present

## 2023-10-23 DIAGNOSIS — R29898 Other symptoms and signs involving the musculoskeletal system: Secondary | ICD-10-CM

## 2023-10-23 DIAGNOSIS — E876 Hypokalemia: Secondary | ICD-10-CM

## 2023-10-23 NOTE — Addendum Note (Signed)
 Addended by: Liam Redhead on: 10/23/2023 11:58 AM   Modules accepted: Level of Service

## 2023-10-23 NOTE — Progress Notes (Addendum)
 Subjective:    Patient ID: Chloe Johnson, female    DOB: 1956/02/07, 68 y.o.   MRN: [id number]  HPI:   Chloe Johnson is a 68 y.o. female who is here for f/u of right shoulder pain, Left Middle Cerebral Artery Stroke, Left Ventricular Thrombus and Essential Hypertension.   Dr. Cleone Dad H&P on 01/28/2022 HPI: Chloe Johnson is a 68 y.o. 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 Alecia Ames, with key details confirmed by husband Chloe Johnson  is a 68 y.o.  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 She presents today after the following series events.  Around 12 PM this past Saturday, the patient noted sudden onset dizziness described as "vertigo" followed by nausea then followed by an episode of vomiting.  She next felt a sudden onset sharp chest pain with radiation to her back.  She assumed it was her cervical radiculopathy "acting up" because shortly after this event, she was holding 2 plates in her hand to feed her cats when she suddenly had no control of both upper extremities and dropped said plates.  On Sunday, she felt generally weak but then improved from all of this until this morning, around 9 AM, when she experienced word finding difficulties.  Her husband, who is at bedside, also notes she had a right facial droop transiently that lasted no more than a few minutes.  She may have had 2 subsequent episodes, as documented by ED  physician; however, all of her is told by the patient's husband that this was 1 single episode."   Her hospital course was noted for acute worsening for which she underwent thrombectomy and was discharged with only some very mild residual aphasia not detectable on examination but noticed by the patient per husband.  He notes that she has been in her normal state of health since discharge other than a visit to the ED on 9/3 with left shoulder pain which was attributed to her known cervical stenosis for which she is planned for surgery in the near future   Husband reports that she was taking his blood pressure when suddenly the blood pressure monitor fell off the table.  She tried to catch it but lost her balance had weakness in both of her legs.  Initially she got to both both arms but then became weak on the right side.  She was unable to speak at all suddenly during this event.  Work-up revealed a left ventricle apical thrombus.   Has been reports she has not missed any of her doses of medication including Eliquis    CTA was attempted to confirm recurrent acute occlusion, however due to IV infiltration and severity of her symptoms decision was made to proceed directly to IR which was discussed both with Dr. Alvira Josephs as well as with the patient's husband   MR Brain:  IMPRESSION: 1. 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 2. No intracranial large vessel occlusion or significant stenosis. In particular, left MCA branches appear perfused and without focal stenosis.  MRA:  IMPRESSION:  1. 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 2. No intracranial large vessel occlusion or significant stenosis. In particular, left MCA branches appear perfused and without focal stenosis.  Dr. Herschell Lore COURSE Stroke:  left MCA punctate scattered infarct with left M2 occlusion s/p IR with TICI2c, embolic secondary to LV thrombus  recently on Eliquis  CT no acute abnormality IR left superior M2 occlusion, inferior M3/M4 nonocclusive thrombus Status post EVT with TICI2c MRI left insular cortex and frontal lobe punctate infarcts MRA unremarkable 2D Echo EF 45 to 50% on 8/29, LV regional motion abnormality with LV thrombus LDL 150 on 8/29 HgbA1c 5 0 on 8/29 UDS positive for THC Hypercoagulable and autoimmune work up negative Heparin  IV for VTE prophylaxis aspirin  81 mg daily and Eliquis  (apixaban ) daily prior to admission,  now on coumadin  with lovenox  bridge. INR goal 2.5 Continue ASA 81 per cardiology. Ongoing aggressive stroke risk factor management Therapy recommendations:  CIR Disposition:  pending   Hx of stroke and LV thrombus 8/29 admitted for nausea vomiting dizziness chest pain and then developed right facial droop and aphasia.  Found to have troponin > 300, EF 45 to 50% with LV thrombus.  CTA coronary artery showed minimal CAD.  CT no acute abnormality.  CTA head and neck showed left M2 occlusion.  CTP 0/7.  Status post EVT with TICI2c.  MRI showed left frontal Patalano matter punctate infarct.  Repeat MRI showed left external capsule, insular cortex and frontal cortex punctate infarcts.  LDL 150, A1c 5.0.  UDS positive for THC.  Patient was put on heparin  IV and aspirin  and eventually discharged on Eliquis  and aspirin  as well as Crestor  20. Per family, patient has slight expressive aphasia since discharge, no physical deficit. Cardiology Dr. Stann Earnest on board, recommend coumadin  with INR goal 2.5. on lovenox  bridge.    Chloe Johnson was admitted to inpatient rehabilitation on 02/04/2022 and discharged home on 02/07/2022. She will begin outpatient Therapy on 02/25/2022 at [facility].  Chloe Johnson has expressive aphasia.  She was positive with THC, she reports she uses CBD products for anxiety, this was discussed with Dr Alessandra Ancona. Dr Alessandra Ancona stated she can continue with her CBD product, she  verbalizes understanding.   1) Right shoulder pain  -pain has been severe -her chiropractor did shockwave and her arm was in so much pain -she could not turn her neck -she feels that she cannot do anything about her shoulder until her neck is stabilized -she was not sure if she wanted to increase the topamax  -she has been waking up and doing more since she went down on the lyrica  -started suddenly when she was doing an activity (history is limited by her aphasia) -she feels this pain is more than just spasticity -it is very severe -she has been doing better with the increase in Lyrica  to 200mg  BID -XR discussed and she would like to get this -she prefers nonsurgical treatment if possible.  -she discussed with Dr. Yvonne Hering and surgery was not recommended due to risk of re-tear -she is scheduled for steroid injection -she would be interested in starting topamax  which we discussed before -pain has returned but she sees Dr. Yvonne Hering next week and she is going to ask for another injection -she thinks her shoulder is healing -positioning in car was causing the 10/10 of pain  2) Aphasia is greatly improving and she continues working with therapy -hyperbaric oxygen therapy has been helping- she plans to continue  through April, she would like to do 100 sessions Dr. Martina Sledge is writing a stroke protocol for her -he always talks to her after he has done the hyperbaric oxygen therapy but her mouth is very dry -she is speaking so well today -long term disability was improved -not frustrated with speech but it is seems like it it shouldn't be this hard -she is still struggling -notes word finding difficuties -sometimes she feels frustrated as she can't find the thought she wants.  -she is going to be discharged from therapy on may 8th -she feels that she continues to improve every day -she slows down when she sees confusion on someone's face -does not feel she can return to work yet   3)  UTI: -pyridium helped with her UTI symptoms  4) CHF -Entresto  has improved  5) HTN -BP has improved -she is taking the spironolactone  and valsartan   6) Atrial fibrillation: -she is so happy to be transitioned to Eliquis  and is happy she has no dietary restrictions  7) Overweight: -her diety has improved and weight has improved  8) CVA: -she has started getting the HBOT at [facility] and loves it -they have 2 HBOT set up there yet -you have to be able to step into it and be spry -in 5 minutes the chamber blows up around her  9) Right hand weakness: -addressed all her envelopes -getting better  Pain Inventory Average Pain 2 Pain Right Now 2 My pain is constant and aching Pain is worse in the evening Sleep is good Pain is worse when walking Pain relief with medications is fair    LOCATION OF PAIN  Back  BOWEL Number of stools per week: 14  Type of laxative Super Cleanse  BLADDER Normal  Mobility walk without assistance how many minutes can you walk? As needed ability to climb steps?  yes do you drive?  yes Do you have any goals in this area?  yes  Function disabled: date disabled 01/21/22  Neuro/Psych bladder control problems bowel control problems weakness numbness anxiety  Prior Studies Any changes since last visit?  no  Physicians involved in your care Any changes since last visit?  no   Family History  Problem Relation Age of Onset   Other Mother    Other Father    Hypertension Father    Cancer Sister    Sleep apnea Sister    Social History   Socioeconomic History   Marital status: Married    Spouse name: Chloe Johnson   Number of children: 3   Years of education: RN   Highest education level: Not on file  Occupational History    Employer: [employer]   Tobacco Use   Smoking status: Never    Passive exposure: Past   Smokeless tobacco: Never  Vaping Use   Vaping status: Never Used  Substance and Sexual Activity    Alcohol use: Yes    Alcohol/week: 2.0 standard drinks of alcohol    Types: 2 Shots of liquor per week    Comment: "a little"; 1/2 a shot x 3 per week   Drug use: No    Comment: takes CBD gummies for sleep and anxiety   Sexual activity: Yes    Birth control/protection: Post-menopausal  Other Topics Concern   Not on file  Social History Narrative   Patient is married and lives at home with her husband Chloe Johnson).  Patient works full time as a Charity fundraiser in [employer] (currently out of work  on short term disability since her stroke and heart attack). Right handed.College education.         Social Drivers of Corporate investment banker Strain: Not on file  Food Insecurity: Not on file  Transportation Needs: Not on file  Physical Activity: Not on file  Stress: Not on file  Social Connections: Not on file   Past Surgical History:  Procedure Laterality Date   BREAST CYST EXCISION Left 2010   "polypectomy"   IR CT HEAD LTD  01/21/2022   IR CT HEAD LTD  01/28/2022   IR PERCUTANEOUS ART THROMBECTOMY/INFUSION INTRACRANIAL INC DIAG ANGIO  01/21/2022   IR PERCUTANEOUS ART THROMBECTOMY/INFUSION INTRACRANIAL INC DIAG ANGIO  01/28/2022   IR US  GUIDE VASC ACCESS RIGHT  01/22/2022   PICC LINE PLACE PERIPHERAL ([facility] HX) Right    for use of Levaquin  & Vancomycin , of note: she reports that she had a "flulike feeling"    RADIOLOGY WITH ANESTHESIA N/A 01/21/2022   Procedure: IR WITH ANESTHESIA;  Surgeon: Luellen Sages, MD;  Location: [facility] OR;  Service: Radiology;  Laterality: N/A;   RADIOLOGY WITH ANESTHESIA N/A 01/28/2022   Procedure: IR WITH ANESTHESIA;  Surgeon: Radiologist, Medication, MD;  Location: [facility] OR;  Service: Radiology;  Laterality: N/A;   TONSILLECTOMY AND ADENOIDECTOMY  1975   TUBAL LIGATION  1999   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/16/2013   L3-4/notes 11/24/2013, arms, neck   Exertional asthma    GERD (gastroesophageal  reflux disease)    Heart attack (HCC) 01/18/2022   Hypertension    Kidney stones    "have always passed them"   Neck pain    Osteomyelitis (HCC) 11/23/2013   osteomyelitis, discitis    Sleep concern    uses Prozac  for sleep    Stroke (HCC) 01/21/2022   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; left with aphasia, dysarthria, and apraxia   BP 111/72 (BP Location: Left Arm, Patient Position: Sitting, Cuff Size: Normal)   Pulse 61   Ht 5' (1.524 m)   Wt 141 lb 12.8 oz (64.3 kg)   SpO2 98%   BMI 27.69 kg/m   Opioid Risk Score:   Fall Risk Score:  `1  Depression screen [facility] 2/9     10/23/2023   11:23 AM 06/26/2023   11:45 AM 02/10/2023    2:31 PM 12/08/2022    2:35 PM 07/11/2022   10:19 AM 02/20/2022    9:22 AM 02/15/2014   11:06 AM  Depression screen PHQ 2/9  Decreased Interest 0 0 0 0 0 0 0  Down, Depressed, Hopeless 0 0 0 0 0 0 1  PHQ - 2 Score 0 0 0 0 0 0 1  Altered sleeping      0   Tired, decreased energy      1   Change in appetite      0   Feeling bad or failure about yourself       0   Trouble concentrating      0   Moving slowly or fidgety/restless      0   Suicidal thoughts      0   PHQ-9 Score      1   Difficult doing work/chores      Not difficult at all       Review of Systems  Constitutional: Negative.   HENT: Negative.    Eyes: Negative.  Respiratory: Negative.    Cardiovascular: Negative.   Gastrointestinal: Negative.   Endocrine: Negative.   Genitourinary: Negative.   Musculoskeletal:  Positive for back pain.       Right shoulder  pain Wrist and hand pain   Skin: Negative.   Allergic/Immunologic: Negative.   Hematological:  Bruises/bleeds easily.       Apixaban   All other systems reviewed and are negative.       Objective:   Gen: no distress, normal appearing HEENT: oral mucosa pink and moist, NCAT Cardio: Reg rate Chest: normal effort, normal rate of breathing Abd: soft, non-distended Ext: no edema Psych: pleasant, normal affect Skin:  intact MSK: mild spasticity in right hand fingers. Sensation intact. Right hand strength greatly improved at 5/5!Aaron Aas Improving speech rate ant intelligibility. Improving right shoulder range of motion, digits 1-3 on right hand have MAS 1     Assessment & Plan:  Left Middle Cerebral Artery Stroke: Continue current medication regimen. Has a scheduled appointment with Neurology. She will begin outpatient therapy at [facility]. Continue to Monitor. Continue speech therapy to continue working on aphasia Discussed that she went to the stroke recovery meeting Referred to OT for vivstem referral Discussed dry needling of her right arm -discussed that ske has been using blue kyanite crystals Continue hyperbaric oxygen therapy, discussed her positive experiences with this Discussed that she was working 80 hours per week but is having difficulty getting disability. Discussed that her mother passed away and her work was expecting her to work through that.  Left Ventricular Thrombus: Continue current medication regimen. Cardiology Following.  Essential Hypertension. Continue current medication regimen. PCP Following. Continue to Monitor. Continue Valsartan  Right shoulder pain: Discussed that MRI shows complete tear, referred to ortho Discussed extracorporeal shockwave therapy as a modality for treatment. Discussed that the device looks and feels like a massage gun and I would move it over the area of pain for about 10 minutes. The device releases sound waves to the area of pain and helps to improve blood flow and circulation to improve the healing process. Discuss that this initially induces inflammation and can sometimes cause short-term increase in pain. Discussed that we typically do three weekly treatments, but sometimes up to 6 if needed, and after 6 weeks long term benefits can sometimes be achieved.  Lyrica  increased to 200mg  BID, discussed that she can continue this dose if she wants but  given her unwanted weight gain we can alternatively replace her morning dose with low dose naltrexone. She preferred to try this latter option. -discussed mechanism of action of low dose naltrexone as an opioid receptor antagonist which stimulates your body's production of its own natural endogenous opioids, helping to decrease pain. Discussed that it can also decrease T cell response and thus be helpful in decreasing inflammation, and symptoms of brain fog, fatigue, anxiety, depression, and allergies. Discussed that this medication needs to be compounded at a compounding pharmacy and can more expensive. Discussed that I usually start at 1mg  and if this is not providing enough relief then I titrate upward on a monthly basis.   Prescribed topamax  to help improve sleep and pain, discussed that it can also curb appetite Encouraged follow-up with Dr. Yvonne Hering next week Discussed that she just received her right shoulder steroid injection  5. Overweight: -continue topamax  50mg  in the day and increase night time topamax  to 75mg  -discussed her current diet -decrease Lyrica  100mg  daily  -Educated that current weight is 150 lbs, BMI 29.29 -Educated  regarding health benefits of weight loss- for pain, general health, chronic disease prevention, immune health, mental health.  -Will monitor weight every visit.  -Consider Roobois tea daily.  -Discussed the benefits of intermittent fasting. -Discussed foods that can assist in weight loss: 1) leafy greens- high in fiber and nutrients 2) dark chocolate- improves metabolism (if prefer sweetened, best to sweeten with honey instead of sugar).  3) cruciferous vegetables- high in fiber and protein 4) full fat yogurt: high in healthy fat, protein, calcium , and probiotics 5) apples- high in a variety of phytochemicals 6) nuts- high in fiber and protein that increase feelings of fullness 7) grapefruit: rich in nutrients, antioxidants, and fiber (not to be taken with  anticoagulation) 8) beans- high in protein and fiber 9) salmon- has high quality protein and healthy fats 10) green tea- rich in polyphenols 11) eggs- rich in choline and vitamin D 12) tuna- high protein, boosts metabolism 13) avocado- decreases visceral abdominal fat 14) chicken (pasture raised): high in protein and iron 15) blueberries- reduce abdominal fat and cholesterol 16) whole grains- decreases calories retained during digestion, speeds metabolism 17) chia seeds- curb appetite 18) chilies- increases fat metabolism  -Discussed supplements that can be used:  1) Metatrim 400mg  BID 30 minutes before breakfast and dinner  2) Sphaeranthus indicus and Garcinia mangostana (combinations of these and #1 can be found in capsicum and zychrome  3) green coffee bean extract 400mg  twice per day or Irvingia (african mango) 150 to 300mg  twice per day.   5. Right hand weakness:  -continue OT -discussed vivistim -discussed e-stim and that she bought her own tens unit -discussed that she started working with a hand specialist -discussed her interest in hand anatomy -provided video to learn how the MCA stroke affects hand strength.   6) Muscle spasms: -continue flexeril  HS  7) UTI -previously prescribed macrobin 100mg  BID for 7 days -discussed UA/UC results -recommended staying well hydrated -recommended Uqora for improving vaginal microbiome  8) Lumbar spinal stenosis: -discussed that we can increase topamax  to 75mg  at night and continue the 50mg  during the day, continue this regimen  9) Spider bites: -examined her right arm -discussed that she is trying to avoid benadryl  -discussed that itching has improved  10) atrial fibrillation: -continue Eliquis  -discussed that she is happy that she does not have dietary restrictions  11) Urinary incontinence: -discussed Inwave  12) Lesion on her right arm:  -discussed that she developed after using infrared light therapy -recommended  manuka honey -continue neosporin  13) Aphasia:  -continue hyperbaric oxygen therapy until April -discussed her improvements with hyperbaric oxygen therapy -discussed that she has difficulty with counting and clock math  14. Right hand weakness/spasticity -discussed improvements with hyperbaric oxygen therapy -discussed her experiences with PT -discussed her right hand weakness and aphasia and how these limit her ability to return to work, discussed that she has an eval with her chiropractor for laser therapy to help with her spasticity -discussed that she had a cord feeling in her arm and this went away with her laser therapy -discussed that she would like to try Botox for her first three fingers   15. Right shoulder pain: -discussed that ECSWT caused her pain -discussed that she got a steroid injection is helping  16. Lower extremity edema: -reviewed Echo and discussed that this showed a normal ejection fraction -discussed that she is on aldactone , discussed that she takes potassium twice per day, continue -encouraged follow-up with PCP -discussed that Lyrica  can cause lower  extremity edema -[place]  17. Hypokalemia: Discussed that she takes potassium twice per day -discussed her history of potassium being very low at 2.8 and that she felt very fatigued at that time  40 minutes spent in discussion of her right hand weakness and spasticity, right shoulder pain, lower extremity edema, hypokalemia, discussed her difficulties with getting disability, encouraged follow-up with her PCP regarding lower extremity edema

## 2023-11-03 ENCOUNTER — Telehealth: Payer: Self-pay

## 2023-11-03 ENCOUNTER — Other Ambulatory Visit: Payer: Self-pay | Admitting: Cardiovascular Disease

## 2023-11-03 NOTE — Telephone Encounter (Signed)
 Pt. Wants a referral for a nerve conduction study for her shoulder and fingers.

## 2023-11-04 NOTE — Telephone Encounter (Signed)
 Prescription refill request for Eliquis  received. Indication: LV thrombus/CVA Last office visit: 10/05/23  P Nahser MD Scr: 0.81 on 10/17/22  Epic Age: 68 Weight: 63.5kg  Based on above findings Eliquis  5mg  twice daily is the appropriate dose.  Refill approved. Pt is due for labs.  Requested they be done at upcoming OV with Dr Veryl Gottron.

## 2023-11-05 ENCOUNTER — Encounter: Payer: Self-pay | Admitting: Physical Medicine and Rehabilitation

## 2023-11-19 ENCOUNTER — Encounter: Payer: Self-pay | Admitting: Physical Medicine and Rehabilitation

## 2023-11-19 ENCOUNTER — Other Ambulatory Visit: Payer: Self-pay | Admitting: Physical Medicine and Rehabilitation

## 2023-11-19 DIAGNOSIS — M62838 Other muscle spasm: Secondary | ICD-10-CM

## 2023-11-20 ENCOUNTER — Encounter (HOSPITAL_COMMUNITY): Payer: Self-pay | Admitting: Interventional Radiology

## 2023-11-20 ENCOUNTER — Ambulatory Visit: Admitting: Cardiovascular Disease

## 2023-11-20 ENCOUNTER — Encounter: Admitting: Physical Medicine and Rehabilitation

## 2023-11-26 ENCOUNTER — Encounter: Payer: Self-pay | Admitting: Physical Medicine and Rehabilitation

## 2023-12-07 ENCOUNTER — Other Ambulatory Visit: Payer: Self-pay | Admitting: Physical Medicine and Rehabilitation

## 2023-12-10 ENCOUNTER — Encounter: Payer: Self-pay | Admitting: Physical Medicine and Rehabilitation

## 2023-12-17 ENCOUNTER — Encounter: Payer: Self-pay | Admitting: Physical Medicine and Rehabilitation

## 2023-12-22 ENCOUNTER — Other Ambulatory Visit: Payer: Self-pay

## 2023-12-22 DIAGNOSIS — R0600 Dyspnea, unspecified: Secondary | ICD-10-CM

## 2023-12-22 DIAGNOSIS — Z79899 Other long term (current) drug therapy: Secondary | ICD-10-CM

## 2023-12-22 DIAGNOSIS — I5181 Takotsubo syndrome: Secondary | ICD-10-CM

## 2023-12-22 DIAGNOSIS — I5032 Chronic diastolic (congestive) heart failure: Secondary | ICD-10-CM

## 2023-12-22 MED ORDER — POTASSIUM CHLORIDE ER 10 MEQ PO TBCR: 10.0000 meq | EXTENDED_RELEASE_TABLET | Freq: Two times a day (BID) | ORAL | 2 refills | Status: AC

## 2023-12-24 ENCOUNTER — Encounter: Attending: Physical Medicine and Rehabilitation | Admitting: Physical Medicine & Rehabilitation

## 2023-12-24 ENCOUNTER — Encounter: Payer: Self-pay | Admitting: Physical Medicine and Rehabilitation

## 2023-12-24 ENCOUNTER — Encounter: Payer: Self-pay | Admitting: Physical Medicine & Rehabilitation

## 2023-12-24 VITALS — BP 131/82 | HR 67 | Ht 60.0 in | Wt 137.0 lb

## 2023-12-24 DIAGNOSIS — R29898 Other symptoms and signs involving the musculoskeletal system: Secondary | ICD-10-CM | POA: Insufficient documentation

## 2023-12-24 NOTE — Progress Notes (Signed)
 EMG/NCV right upper extremity requested by Dr. Lorilee  Please see full report under the media tab in chart review This is a normal electrodiagnostic study of the right upper extremity. No evidence of focal neuropathy affecting the right ulnar right median nerve or the sensory branch of the right radial nerve.  There is no electrodiagnostic evidence of cervical radiculopathy.

## 2023-12-29 ENCOUNTER — Encounter: Payer: Self-pay | Admitting: Physical Medicine & Rehabilitation

## 2023-12-29 ENCOUNTER — Other Ambulatory Visit: Payer: Self-pay

## 2023-12-29 DIAGNOSIS — I5032 Chronic diastolic (congestive) heart failure: Secondary | ICD-10-CM

## 2023-12-29 DIAGNOSIS — Z79899 Other long term (current) drug therapy: Secondary | ICD-10-CM

## 2023-12-29 DIAGNOSIS — I5181 Takotsubo syndrome: Secondary | ICD-10-CM

## 2023-12-29 DIAGNOSIS — R0600 Dyspnea, unspecified: Secondary | ICD-10-CM

## 2023-12-29 MED ORDER — VALSARTAN 80 MG PO TABS: 80.0000 mg | ORAL_TABLET | Freq: Every day | ORAL | 2 refills | Status: AC

## 2023-12-31 ENCOUNTER — Encounter: Payer: Self-pay | Admitting: Physical Medicine and Rehabilitation

## 2023-12-31 ENCOUNTER — Ambulatory Visit: Admitting: Physical Medicine and Rehabilitation

## 2024-01-07 ENCOUNTER — Encounter: Attending: Physical Medicine and Rehabilitation | Admitting: Physical Medicine and Rehabilitation

## 2024-01-07 ENCOUNTER — Encounter: Payer: Self-pay | Admitting: Physical Medicine and Rehabilitation

## 2024-01-07 VITALS — BP 97/70 | HR 62 | Ht 60.0 in | Wt 136.0 lb

## 2024-01-07 DIAGNOSIS — R252 Cramp and spasm: Secondary | ICD-10-CM | POA: Diagnosis not present

## 2024-01-07 MED ORDER — SODIUM CHLORIDE (PF) 0.9 % IJ SOLN
3.0000 mL | Freq: Once | INTRAMUSCULAR | Status: AC
  Administered 2024-01-07: 3 mL

## 2024-01-07 MED ORDER — ONABOTULINUMTOXINA 100 UNITS IJ SOLR
100.0000 [IU] | Freq: Once | INTRAMUSCULAR | Status: AC
  Administered 2024-01-07: 100 [IU] via INTRAMUSCULAR

## 2024-01-07 MED ORDER — PREGABALIN 75 MG PO CAPS: 75.0000 mg | ORAL_CAPSULE | Freq: Every day | ORAL | 3 refills | Status: DC

## 2024-01-07 NOTE — Progress Notes (Addendum)
 Botox  Injection for spasticity using needle US  guidance   Indication: Severe spasticity which interferes with ADL,mobility and/or  hygiene and is unresponsive to medication management and other conservative care Informed consent was obtained after describing risks and benefits of the procedure with the patient. This includes bleeding, bruising, infection, excessive weakness, or medication side effects. A REMS form is on file and signed.  RUE: Biceps 50U 10U into each lumbrical All injections were done after obtaining appropriate EMG activity and after negative drawback for blood. The patient tolerated the procedure well. Post procedure instructions were given. A followup appointment was made.    Lumbosacral brace ordered from [facility] for lower back pain

## 2024-01-14 ENCOUNTER — Encounter: Payer: Self-pay | Admitting: Psychology

## 2024-02-22 ENCOUNTER — Other Ambulatory Visit: Payer: Self-pay

## 2024-02-22 DIAGNOSIS — R0600 Dyspnea, unspecified: Secondary | ICD-10-CM

## 2024-02-22 DIAGNOSIS — I5181 Takotsubo syndrome: Secondary | ICD-10-CM

## 2024-02-22 DIAGNOSIS — I5020 Unspecified systolic (congestive) heart failure: Secondary | ICD-10-CM

## 2024-02-24 MED ORDER — CARVEDILOL 3.125 MG PO TABS
3.1250 mg | ORAL_TABLET | Freq: Two times a day (BID) | ORAL | 2 refills | Status: AC
Start: 2024-02-24 — End: ?

## 2024-02-24 MED ORDER — FAMOTIDINE 20 MG PO TABS: 20.0000 mg | ORAL_TABLET | Freq: Two times a day (BID) | ORAL | 2 refills | Status: AC

## 2024-03-04 ENCOUNTER — Other Ambulatory Visit: Payer: Self-pay | Admitting: Physical Medicine and Rehabilitation

## 2024-03-09 ENCOUNTER — Telehealth: Payer: Self-pay

## 2024-03-09 DIAGNOSIS — Z01818 Encounter for other preprocedural examination: Secondary | ICD-10-CM

## 2024-03-09 NOTE — Telephone Encounter (Addendum)
 Left message for the patient to call our office and ask for the preop team to schedule TELE Preop appt and that labs can be completed at [facility]. In message pt was told that the TELE appt will need to be done after labs are completed.  FYI:  TELE will need to be after labs are completed (per Lamarr Satterfield, NP) Lab order will be placed and released for pt to complete at any [facility]. (CBC and BMET per Lamarr Satterfield, NP)

## 2024-03-09 NOTE — Telephone Encounter (Signed)
 Please have patient get labs (CBC, BMET) so that pharmacy can make recommendations on Eliquis .  She will need a VV when pharmacy has weighed in.  Thx!

## 2024-03-09 NOTE — Addendum Note (Signed)
 Addended by: Dorathy Stallone A on: 03/09/2024 05:15 PM   Modules accepted: Orders

## 2024-03-09 NOTE — Telephone Encounter (Signed)
   Pre-operative Risk Assessment    Patient Name: Chloe Johnson  DOB: February 26, 1956 MRN: [id number]   Date of last office visit: 10/05/23 ALEENE PASSE, MD Date of next office visit: NONE   Request for Surgical Clearance    Procedure:  L1-L2 ESI  Date of Surgery:  Clearance TBD                                Surgeon:  DR DEATRICE MANUS Surgeon's Group or Practice Name:  [facility] Phone number:  [phone] Fax number:  [phone]   Type of Clearance Requested:   - Medical  - Pharmacy:  Hold Apixaban  (Eliquis ) 3 DAYS PRIOR AND RESUME DAY AFTER   Type of Anesthesia:  Not Indicated   Additional requests/questions:    Signed, Lucie DELENA Ku   03/09/2024, 10:26 AM

## 2024-03-10 NOTE — Telephone Encounter (Signed)
 Pt calling back as told yesterday to schedule Televisit. Please advise

## 2024-03-11 LAB — BASIC METABOLIC PANEL WITH GFR
BUN/Creatinine Ratio: 10 — ABNORMAL LOW (ref 12–28)
BUN: 8 mg/dL (ref 8–27)
CO2: 23 mmol/L (ref 20–29)
Calcium: 9.5 mg/dL (ref 8.7–10.3)
Chloride: 108 mmol/L — ABNORMAL HIGH (ref 96–106)
Creatinine, Ser: 0.83 mg/dL (ref 0.57–1.00)
Glucose: 80 mg/dL (ref 70–99)
Potassium: 3.7 mmol/L (ref 3.5–5.2)
Sodium: 144 mmol/L (ref 134–144)
eGFR: 78 mL/min/1.73 (ref >59)

## 2024-03-11 LAB — CBC
Hematocrit: 38 % (ref 34.0–46.6)
Hemoglobin: 12.3 g/dL (ref 11.1–15.9)
MCH: 28.8 pg (ref 26.6–33.0)
MCHC: 32.4 g/dL (ref 31.5–35.7)
MCV: 89 fL (ref 79–97)
Platelets: 198 x10E3/uL (ref 150–450)
RBC: 4.27 x10E6/uL (ref 3.77–5.28)
RDW: 12.7 % (ref 11.7–15.4)
WBC: 5.7 x10E3/uL (ref 3.4–10.8)

## 2024-03-14 ENCOUNTER — Telehealth: Payer: Self-pay

## 2024-03-14 NOTE — Telephone Encounter (Signed)
 Left message for patient to call back to get her scheduled and to confirm labs were completed.

## 2024-03-15 ENCOUNTER — Telehealth: Payer: Self-pay | Admitting: Cardiology

## 2024-03-15 ENCOUNTER — Encounter: Attending: Physical Medicine and Rehabilitation | Admitting: Psychology

## 2024-03-15 DIAGNOSIS — R4701 Aphasia: Secondary | ICD-10-CM | POA: Diagnosis present

## 2024-03-15 DIAGNOSIS — I639 Cerebral infarction, unspecified: Secondary | ICD-10-CM | POA: Insufficient documentation

## 2024-03-15 NOTE — Telephone Encounter (Signed)
 Neysa Mort [facility]   03/15/24 12:42 PM Note Pt returning call to make appt. Please advise.        03/15/24 12:42 PM Anctil, Therisa DEL contacted Neysa Mort

## 2024-03-15 NOTE — Telephone Encounter (Signed)
 Pt returning call to make appt. Please advise.

## 2024-03-15 NOTE — Telephone Encounter (Signed)
 Called pt and someone picked up but then call disconnected. Called back and left message to call back to schedule tele preop appt.

## 2024-03-15 NOTE — Telephone Encounter (Signed)
 Pt requesting a c/b to schedule tele visit.

## 2024-03-16 ENCOUNTER — Telehealth (HOSPITAL_BASED_OUTPATIENT_CLINIC_OR_DEPARTMENT_OTHER): Payer: Self-pay | Admitting: *Deleted

## 2024-03-16 NOTE — Telephone Encounter (Signed)
 S/w the pt and she has been scheduled tele preop appt 03/23/24. States Dr. Darlis is waiting for clearance before he will schedule the procedure. Med rec and consent are done.      Patient Consent for Virtual Visit        Chloe Johnson has provided verbal consent on 03/16/2024 for a virtual visit (video or telephone).   CONSENT FOR VIRTUAL VISIT FOR:  Chloe Johnson  By participating in this virtual visit I agree to the following:  I hereby voluntarily request, consent and authorize [facility] and its employed or contracted physicians, physician assistants, nurse practitioners or other licensed health care professionals (the Practitioner), to provide me with telemedicine health care services (the "Services) as deemed necessary by the treating Practitioner. I acknowledge and consent to receive the Services by the Practitioner via telemedicine. I understand that the telemedicine visit will involve communicating with the Practitioner through live audiovisual communication technology and the disclosure of certain medical information by electronic transmission. I acknowledge that I have been given the opportunity to request an in-person assessment or other available alternative prior to the telemedicine visit and am voluntarily participating in the telemedicine visit.  I understand that I have the right to withhold or withdraw my consent to the use of telemedicine in the course of my care at any time, without affecting my right to future care or treatment, and that the Practitioner or I may terminate the telemedicine visit at any time. I understand that I have the right to inspect all information obtained and/or recorded in the course of the telemedicine visit and may receive copies of available information for a reasonable fee.  I understand that some of the potential risks of receiving the Services via telemedicine include:  Delay or interruption in medical evaluation due to technological  equipment failure or disruption; Information transmitted may not be sufficient (e.g. poor resolution of images) to allow for appropriate medical decision making by the Practitioner; and/or  In rare instances, security protocols could fail, causing a breach of personal health information.  Furthermore, I acknowledge that it is my responsibility to provide information about my medical history, conditions and care that is complete and accurate to the best of my ability. I acknowledge that Practitioner's advice, recommendations, and/or decision may be based on factors not within their control, such as incomplete or inaccurate data provided by me or distortions of diagnostic images or specimens that may result from electronic transmissions. I understand that the practice of medicine is not an exact science and that Practitioner makes no warranties or guarantees regarding treatment outcomes. I acknowledge that a copy of this consent can be made available to me via my patient portal [facility] MyChart), or I can request a printed copy by calling the office of  [facility].    I understand that my insurance will be billed for this visit.   I have read or had this consent read to me. I understand the contents of this consent, which adequately explains the benefits and risks of the Services being provided via telemedicine.  I have been provided ample opportunity to ask questions regarding this consent and the Services and have had my questions answered to my satisfaction. I give my informed consent for the services to be provided through the use of telemedicine in my medical care

## 2024-03-16 NOTE — Telephone Encounter (Signed)
 S/w the pt and she has been scheduled tele preop appt 03/23/24. States Dr. Darlis is waiting for clearance before he will schedule the procedure. Med rec and consent are done.

## 2024-03-16 NOTE — Telephone Encounter (Signed)
 Pt returning call

## 2024-03-16 NOTE — Telephone Encounter (Signed)
 I have called the pt back today. Pt has been calling to scheduler her tele preop appt. I will reach out to pharm-d about blood thinner recommendations. Requesting office states cannot schedule until cleared.

## 2024-03-20 ENCOUNTER — Encounter: Payer: Self-pay | Admitting: Psychology

## 2024-03-20 NOTE — Progress Notes (Signed)
 NEUROPSYCHOLOGICAL EVALUATION [place]  Physical Medicine and Rehabilitation     Patient: Chloe Johnson  MRN: [id number] DOB: 02/07/1956  Age: 68 y.o. Sex: female  Race/Ethnicity: [place] or Caucasian  Years of Education: 16 Handedness: Right  Referring Provider: Lorilee Sven SQUIBB, MD  Provider/Clinical Neuropsychologist: Evalene DOROTHA Riff, PsyD  Date of Service: 03/16/2024 Start Time: 3 PM End Time: 5 PM  Location of Service:  [facility] Physical Medicine & Rehabilitation Department [place]. [facility] [address] [place]. [address] [place], [place] [address] Phone: [phone]  Billing Code/Service:            96116/96121  Individuals Present: Patient was seen unaccompanied, in-person, by the provider. 1 hour and 15 minutes spent in face-to-face clinical interview and remaining 45 minutes was spent in record review, documentation, and testing protocol construction.    PATIENT CONSENT AND CONFIDENTIALITY The patient's understanding of the reason for referral was intact. Discussed limits of confidentiality including, but not limited to, posting of final evaluation report in the patient's electronic medical record for both the patient and for the referring provider and appropriate medical professionals. Patient was given the opportunity to have their questions answered. The neuropsychological evaluation process was discussed with the patient and they consented to proceed with the evaluation.  Consent for Evaluation and Treatment: Signed: Yes Explanation of Privacy Policies: Signed: Yes Discussion of Confidentiality Limits: Yes  SALIENT MEDICAL RECORDS & REASON FOR REFERRAL:The patient was referred for neuropsychological evaluation by her physiatrist, Dr. Lorilee, for cognitive evaluation. 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 status post  thrombectomy... 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 She lives with spouse.  Independent prior to admission. Records go on to state the patient Presented 01/28/2022 with acute onset of right-sided weakness and aphasia... recently returned from traveling to [place]  5 days ago... CT/MRI showed new acute infarct in left posterior frontal and anterior parietal cortex... Underwent bilateral common carotid arteriogram as well as right vertebral artery angiogram showing occluded superior division of left MCA artery in the M2 region and underwent mechanical thrombectomy for revascularization...  She was seen by neurology service as well as cardiology services and was noted to have history of recent CVA secondary to LV thrombus. Following inpatient rehabilitation, the patient was discharge home.   The patient was seen for outpatient neurology follow up on 03/31/2022. Visit notes indicated She works as a astronomer, work from home, January 16, 2022 she was traveling at a slight monitoring, noticed dizziness, then diarrhea, vomiting, she was still able to push herself to work. January 21, 2022, she had sudden onset right hand weakness, dropped the plate, transient speech difficulty lasting for 30 minutes, husband also noted mild right facial droop, symptoms were transient, she was able to bounce back to baseline. MRI of the brain without contrast... small acute infarction at the left frontal [place] matter... Same evening...after admitted...noted to have word finding difficulty. CT angiogram of head and neck showed occluded left M2 MCA, perfusion identified critically hypoperfused parenchyma within the left insular/frontal lobe, reported mismatch volume 7 mm. She had a successful mechanical thrombectomy for distal M2 MCA occlusion with direct contact aspiration achieving complete  recanalization. When she wake up from anesthesia, she denies focal symptoms. Repeat MRI of the brain January 22, 2022 showed no punctuated acute infarction in the left external  capsule, insula, frontal lobe cortex, and previous noted left frontal lobe acute infarction. Later On January 28, 2022, while she was checking her husband's blood pressure, she suddenly slumped over, developed language difficulty right-sided weakness, was taken to hospital by ambulance...new acute infarction in the left posterior frontal anterior parietal cortex, left MCA branch appear perfused and without focal stenosis... January 28, 2022 endovascular revascularization of occluded division of left MCA achieving a TICI 2C revascularization of the left MCA distribution, she was intubated due to poor responsiveness following reversal of anesthesia. She insists she came around from intubation and anesthesia, she had a persistent deficit, aphasia and word finding difficulties, no comprehensive difficulty, also has right arm more than left weakness, over the past 2 months, she had some progression, not back to baseline yet.  Follow-up neurology visit on 08/04/2022 indicated She continues to improve, but she continues to feel right hand is clumsy, feeling is coming back, expressive aphasia, no comprehensive trouble, it takes a while to get her words out, difficulty spelling... MRI of the brain from November 2023, chronic left posterior frontal/parietal stroke, no new lesion, she works as a higher education careers adviser, is on disability, independent in daily activity now. Subsequent EEG was reportedly unremarkable. Records indicate the patient underwent sleep study in early 2024 and results were negative for OSA. SLP notes from 10/01/23 noted improvements in aphasia/communication through the therapy. Conversation was reported to be nearly at [facility].  Upon interview, the patient indicated she was interested in undergoing the evaluation to assess her  current cognitive functioning to determine where she is at with respect to cognitive changes from the stroke.  She also indicated some interest in possible future reevaluation to assess for change over time.  HISTORY OF PRESENTING CONCERNS: The following was obtained via interview. The patient readily answered all questions posed. There was some indication of word finding difficulties. Receptive language appeared intact overall. That said, there were a couple of instances in which her response did not clearly align with the question posed. This may have been due to slight tangentiality, difficulties with recall, or distraction, but receptive speech changes must also be considered. Some aspects of the interview were abbreviated due to time constraints and tangentiality, but this information will be obtained at follow-up during the feedback visit. Information needed for test protocol construction was obtained in full.    Cognitive Symptom Onset & Course: The patient was functioning well and fully independently prior to her stroke in 2023.  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  She is currently on disability. The patient described improvements in her symptoms over time, but some residual difficulties persist.    Current Cognitive Complaints:  Memory: Improved but still some difficulties. Misplaces things, relies on calendar/phone for tracking medical appointments. Minimal difficulty in tracking medications. No difficulties getting lost in familiar places.   Processing Speed: Slowed relative to premorbid baseline.   Attention & Concentration: Increased distractibility and some possible challenges with concentration.   Language: Expressive language is still impacted by some mild aphasia. Receptive speech appeared grossly intact in conversation. Some difficulties with articulation (dysarthria), reduced loudness, and word-finding. She described  always having some level of difficulty understanding others, although the provider was unable to clarify.   Visual-Spatial: Slight possible difficulties implied by problems with reading that are reduced on the phone due to, per her description, narrower field of view. No difficulties driving or navigating locations on foot.   Executive  Functioning: No marked impairments in impulse control or judgment per her report. She indicated that others thought she was well organized historically, although she offered that this was a means of compensating for premorbid cognitive difficulties.   Motor/Sensory Complaints:  Sensory changes: Hearing is reduced in her left ear. She has some vision difficulties.  Balance/coordination difficulties:Improved via PT.  Frequent instances of dizziness/vertigo: None.  Other motor difficulties: Spasticity and pain are significant and impact her right (dominant) arm/hand.   Emotional and Behavioral Functioning:  History: The patient endorsed history of traumatic events throughout her life dating back to childhood. Details were not explored regarding events in early life. She was amenable to completing a PTSD questionnaire during follow up. The patient described engaging with mental health during adulthood during adulthood, particularly when caring for her young children. She described traumatic events related to the loss of family members under complex and atypically distressing circumstances. The patient had been engaged in individual therapy prior to the stroke, and is considering returning.  Depression: She described some indications of possible depression history. She attributed more recent changes in mood related to GLP1, but indicated that this has improved in recent weeks.  Anxiety: Significant and life-long difficulties with anxiety, particualrly in social situations.  Other: Denied problems with hallucination, suicidal ideation, and homicidal ideation. Her descriptions  of paranoia appear to reflect anxiety rather than psychosis. She reported a past history of OCD like counting behavior that reflected acute anxiety. She indicated that is not current present.   Sleep: 6 hours per night. No significant struggles with onset or maintenance indicated.  Appetite: Reduced via medication. Caffeine: Minimal  Alcohol Use: Limited.  Tobacco Use: None Recreational Substance Use: Rare THC consumption.    Level of Functional Independence: The patient is intact with basic and instrumental activities of daily living overall. Some possible compensatory strategy use for tracking appointments, although this may be within normal limits.    Medical History/Record Review: Per records and patient report, History of traumatic brain injury/concussion: Suspected 2x concussions in early life.    History of stroke: See above   History of heart attack: See above   History of cancer/chemotherapy:None   History of seizure activity: None   Symptoms of chronic pain: Significant.    Experience of frequent headaches/migraines: None    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/16/2013   L3-4/notes 11/24/2013, arms, neck   Exertional asthma    GERD (gastroesophageal reflux disease)    Heart attack (HCC) 01/18/2022   Hypertension    Kidney stones    have always passed them   Neck pain    Osteomyelitis (HCC) 11/23/2013   osteomyelitis, discitis    Sleep concern    uses Prozac  for sleep    Stroke (HCC) 01/21/2022   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; left with aphasia, dysarthria, and apraxia   Patient Active Problem List   Diagnosis Date Noted   Aphasia 03/31/2022   Snores 03/31/2022   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   UTI (urinary tract infection) 02/04/2022   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   Stroke determined by clinical assessment (HCC) 01/28/2022   Middle cerebral artery embolism, left 01/28/2022    H/O ischemic left MCA stroke    Acute respiratory failure (HCC)    CVA (cerebral vascular accident) (HCC) 01/21/2022   NSTEMI (non-ST elevated myocardial infarction) (HCC) 01/21/2022   LV (left ventricular) mural  thrombus 01/21/2022   HFrEF (heart failure with reduced ejection fraction) (HCC) 01/21/2022   Hyperlipidemia 01/21/2022   Lumbar stenosis 01/21/2022   Stroke (cerebrum) (HCC) 01/21/2022   S/P lumbar spinal fusion 11/15/2014   Lumbar discitis 11/29/2013   Discitis 11/24/2013   Diarrhea 11/17/2013   Osteomyelitis of lumbar spine (HCC) 11/16/2013   Hypertension    Asthma    Anxiety    Family Neurologic/Medical Hx:  Family History  Problem Relation Age of Onset   Other Mother    Other Father    Hypertension Father    Cancer Sister    Sleep apnea Sister    Medications:  acetaminophen  (TYLENOL ) 325 MG tablet B Complex-C-Folic Acid TABS carvedilol  (COREG ) 3.125 MG tablet cyclobenzaprine  (FLEXERIL ) 10 MG tablet ELIQUIS  5 MG TABS tablet famotidine  (PEPCID ) 20 MG tablet Ginkgo Biloba 120 MG CAPS loratadine  (CLARITIN ) 10 MG tablet Magnesium 300 MG CAPS mirabegron  ER (MYRBETRIQ ) 50 MG TB24 tablet Naltrexone HCl, Pain, 1.5 MG CAPS potassium chloride  (KLOR-CON ) 10 MEQ tablet pregabalin  (LYRICA ) 100 MG capsule pregabalin  (LYRICA ) 75 MG capsule rosuvastatin  (CRESTOR ) 20 MG tablet Semaglutide -Weight Management (WEGOVY  [place]) spironolactone  (ALDACTONE ) 25 MG tablet topiramate  (TOPAMAX ) 25 MG tablet topiramate  (TOPAMAX ) 50 MG tablet valsartan  (DIOVAN ) 80 MG tablet   Academic/Vocational History: Graduated high school and went on to earn her bachelor's in nursing school. She described some indications of difficulty with attention and concentration, reading, and maths. 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 She described having a desire to return to work, but recognizing that her cognitive and communication difficulties preclude  this, in addition to physical/motor/mobility changes (traveled a lot for work). 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   Psychosocial: Marital Status: Married for a total of 32 years.  Children/Grandchildren: 3 children and 4 grandchildren.  Living Situation: Lives with spouse.  Daily Activities/Hobbies: Stays relatively active.   Mental Status/Behavioral Observations: The patient was seen on an outpatient basis in the [facility] PM&R office for the clinical interview unaccompanied.  Sensorium/Arousal: Alert. Hearing is reportedly reduced in her left ear. She was able to hear adequately for the interview and hearing is adequate for the purpose of cognitive testing. Vision is adequate for the purpose of testing.  Orientation: Full.  Appearance: Appropriate dress and hygiene.  Behavior: Cooperative and friendly.  Speech/Language: Dysarthric, slight hypophonia. Prosody was intact. Speech was fluent outside of interference from word-finding. Receptive language was largely unremarkable, but a couple of instances in which it was unclear that the question was fully understood based on her response (question was repeated once).  Motor: Ambulated independently. Spasticity and pain impacting her right (dominant) arm/hand.  Social Comportment: Within normal limits.  Mood: Euthymic.  Affect: Congruent.  Thought Process/Content: No indications of psychosis.  Ability to Participate in Interview: Readily answered most questions with adequate detail from personal history. Some slight redirection was required due to minor (but topically appropriate) tangentiality. She responded to this well/did not show clear deficits in inhibitory control.  Insight: Good.   SUMMARY / CLINICAL IMPRESSIONS The patient was referred for neuropsychological evaluation by her physiatrist, Dr. Lorilee, for cognitive evaluation. 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 status post thrombectomy... 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 She lives with spouse.  Independent prior to admission. Records  go on to state the patient Presented 01/28/2022 with acute onset of right-sided weakness and aphasia... recently returned from traveling to [place]  5 days ago... CT/MRI showed new acute infarct in left posterior frontal and anterior parietal cortex... Underwent bilateral common carotid arteriogram as well as right vertebral artery angiogram showing occluded superior division of left MCA artery in the M2 region and underwent mechanical thrombectomy for revascularization...  She was seen by neurology service as well as cardiology services and was noted to have history of recent CVA secondary to LV thrombus. Following inpatient rehabilitation, the patient was discharge home.   The patient was seen for outpatient neurology follow up on 03/31/2022. Visit notes indicated She works as a astronomer, work from home, January 16, 2022 she was traveling at a slight monitoring, noticed dizziness, then diarrhea, vomiting, she was still able to push herself to work. January 21, 2022, she had sudden onset right hand weakness, dropped the plate, transient speech difficulty lasting for 30 minutes, husband also noted mild right facial droop, symptoms were transient, she was able to bounce back to baseline. MRI of the brain without contrast... small acute infarction at the left frontal [place] matter... Same evening...after admitted...noted to have word finding difficulty. CT angiogram of head and neck showed occluded left M2 MCA, perfusion identified critically hypoperfused parenchyma within the left insular/frontal lobe, reported mismatch volume 7 mm. She had a successful  mechanical thrombectomy for distal M2 MCA occlusion with direct contact aspiration achieving complete recanalization. When she wake up from anesthesia, she denies focal symptoms. Repeat MRI of the brain January 22, 2022 showed no punctuated acute infarction in the left external capsule, insula, frontal lobe cortex, and previous noted left frontal lobe acute infarction. Later On January 28, 2022, while she was checking her husband's blood pressure, she suddenly slumped over, developed language difficulty right-sided weakness, was taken to hospital by ambulance...new acute infarction in the left posterior frontal anterior parietal cortex, left MCA branch appear perfused and without focal stenosis... January 28, 2022 endovascular revascularization of occluded division of left MCA achieving a TICI 2C revascularization of the left MCA distribution, she was intubated due to poor responsiveness following reversal of anesthesia. She insists she came around from intubation and anesthesia, she had a persistent deficit, aphasia and word finding difficulties, no comprehensive difficulty, also has right arm more than left weakness, over the past 2 months, she had some progression, not back to baseline yet.  Follow-up neurology visit on 08/04/2022 indicated She continues to improve, but she continues to feel right hand is clumsy, feeling is coming back, expressive aphasia, no comprehensive trouble, it takes a while to get her words out, difficulty spelling... MRI of the brain from November 2023, chronic left posterior frontal/parietal stroke, no new lesion, she works as a higher education careers adviser, is on disability, independent in daily activity now. Subsequent EEG was reportedly unremarkable. Records indicate the patient underwent sleep study in early 2024 and results were negative for OSA. SLP notes from 10/01/23 noted improvements in aphasia/communication through the therapy. Conversation was reported to be nearly at  [facility].  Upon interview, the patient indicated she was interested in undergoing the evaluation to assess her current cognitive functioning to determine where she is at with respect to cognitive changes from the stroke.  She also indicated some interest in possible future reevaluation to assess for change over time.  The patient appears to have improved significantly. Some aphasia may still persist, but communication is minimally impacted. She describes  some difficulties with attention that may be declines from premorbid weakness, and minor short term memory difficulties. She has a significant psychiatric history and there is some concern for notable anxiety at present. Both areas will be further evaluated, along with assessment of language. The evaluation will be beneficial as she does not appear to have had formal cognitive testing since her stroke. Data will clarify current cognitive functioning and guide treatment recommendations and planning. She expressed interest in future re-evaluation to assess for change over time, which would also be possible though comparison to a baseline established in this evaluation. Assessing variability over time would also be beneficial for treatment planning.  DISPOSITION / PLAN The patient has been set up for a formal neuropsychological assessment to objectively assess her cognitive functioning across domains to establish the patient's cognitive profile. This data, in conjunction with information obtained via clinical interview and medical record review, will help clarify likely etiology and guide treatment recommendations. Once data collection and interpretation have been completed, the findings / diagnosis and recommendations will be reviewed and discussed with the patient during a feedback appointment with the neuropsychologist. Based on the collaborative dialogue with the patient during the feedback, recommendations may be adjusted / tailored as needed. A formal report  will be produced and provided to the patient and the referring provider.   Diagnosis: CVA (cerebral vascular accident) (HCC) Aphasia   Evalene DOROTHA Riff, PsyD Clinical Neuropsychology [address] [place], [place] [address] Main: [phone] Fax: [phone]  This report was generated using voice recognition software. While this document has been carefully reviewed, transcription errors may be present. I apologize in advance for any inconvenience. Please contact me if further clarification is needed.

## 2024-03-23 ENCOUNTER — Ambulatory Visit: Attending: Cardiology | Admitting: Emergency Medicine

## 2024-03-23 DIAGNOSIS — Z01810 Encounter for preprocedural cardiovascular examination: Secondary | ICD-10-CM

## 2024-03-23 NOTE — Telephone Encounter (Signed)
 OV with Dr. Lonni has now been scheduled. Pt agreed to appt date and time.

## 2024-03-23 NOTE — Telephone Encounter (Signed)
 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 She would need to hold Eliquis  for 3 days.   She was a pt of Dr. Alveta. He has retired. She has been reassigned to Dr. Lonni, but she has not seen her.  Will need to defer to Dr. Lonni. Previously seen by hematology, but they felt she didn't need to follow long term.   **This guidance is not considered finalized until pre-operative APP has relayed final recommendations.**

## 2024-03-23 NOTE — Progress Notes (Deleted)
 Virtual Visit via Telephone Note   Because of Chloe Johnson co-morbid illnesses, she is at least at moderate risk for complications without adequate follow up.  This format is felt to be most appropriate for this patient at this time.  Due to technical limitations with video connection web designer), today's appointment will be conducted as an audio only telehealth visit, and Chloe Johnson verbally agreed to proceed in this manner.   All issues noted in this document were discussed and addressed.  No physical exam could be performed with this format.  Evaluation Performed:  Preoperative cardiovascular risk assessment _____________   Date:  03/23/2024   Patient ID:  Chloe Johnson, DOB 1956/03/25, MRN [id number] Patient Location:  Home Provider location:   Office  Primary Care Provider:  Dwight Trula SQUIBB, MD Primary Cardiologist:  Shelda Bruckner, MD  Chief Complaint / Patient Profile   68 y.o. y/o female with a h/o questionable Takotsubo syndrome, LV thrombus with subsequent embolization/CVA, chronic systolic CHF, stroke who is pending L1-L2 ESI on date TBD with [facility] and presents today for telephonic preoperative cardiovascular risk assessment.  History of Present Illness    Chloe Johnson is a 68 y.o. female who presents via audio/video conferencing for a telehealth visit today.  Pt was last seen in cardiology clinic on 10/05/2023 by Dr. Alveta.  At that time KALANIE FEWELL was doing well.  The patient is now pending procedure as outlined above.   Since her last visit, she denies chest pain, shortness of breath, lower extremity edema, fatigue, palpitations, melena, hematuria, hemoptysis, diaphoresis, weakness, presyncope, syncope, orthopnea, and PND.  Today patient is doing well without acute concerns.  She stays active without any exertional symptoms.  Will occasionally have some positional chest pains that is resolved with certain movement.  Denies any chest  pains on exertion.  No symptoms to suggest active angina.  No dyspnea, syncope, or palpitations.  Overall she is able to complete greater than 4 METS.  Past Medical History    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/16/2013   L3-4/notes 11/24/2013, arms, neck   Exertional asthma    GERD (gastroesophageal reflux disease)    Heart attack (HCC) 01/18/2022   Hypertension    Kidney stones    have always passed them   Neck pain    Osteomyelitis (HCC) 11/23/2013   osteomyelitis, discitis    Sleep concern    uses Prozac  for sleep    Stroke (HCC) 01/21/2022   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; left with aphasia, dysarthria, and apraxia   Past Surgical History:  Procedure Laterality Date   BREAST CYST EXCISION Left 2010   polypectomy   IR CT HEAD LTD  01/21/2022   IR CT HEAD LTD  01/28/2022   IR PERCUTANEOUS ART THROMBECTOMY/INFUSION INTRACRANIAL INC DIAG ANGIO  01/21/2022   IR PERCUTANEOUS ART THROMBECTOMY/INFUSION INTRACRANIAL INC DIAG ANGIO  01/28/2022   IR US  GUIDE VASC ACCESS RIGHT  01/22/2022   PICC LINE PLACE PERIPHERAL ([facility] HX) Right    for use of Levaquin  & Vancomycin , of note: she reports that she had a flulike feeling    RADIOLOGY WITH ANESTHESIA N/A 01/21/2022   Procedure: IR WITH ANESTHESIA;  Surgeon: Dolphus Carrion, MD;  Location: [facility] OR;  Service: Radiology;  Laterality: N/A;   RADIOLOGY WITH ANESTHESIA N/A 01/28/2022   Procedure: IR WITH ANESTHESIA;  Surgeon: Radiologist,  Medication, MD;  Location: [facility] OR;  Service: Radiology;  Laterality: N/A;   TONSILLECTOMY AND ADENOIDECTOMY  1975   TUBAL LIGATION  1999    Allergies  Allergies  Allergen Reactions   Doxycycline  Nausea Only    malaise    Erythromycin Nausea And Vomiting   Iodine     unknown   Other Diarrhea    Oral Steroids   Vancomycin     Vicodin [Hydrocodone-Acetaminophen ] Other (See Comments)    Restless, tolerate percocet   Dilaudid  [Hydromorphone  Hcl]  Rash    *Pt states she can take if given benadryl  prior*   Keflex [Cephalexin] Rash   Penicillins Rash    Hives   Sulfa Antibiotics Rash    Hives    Home Medications    Prior to Admission medications   Medication Sig Start Date End Date Taking? Authorizing Provider  acetaminophen  (TYLENOL ) 325 MG tablet Take 2 tablets (650 mg total) by mouth every 4 (four) hours as needed for mild pain (or temp > 37.5 C (99.5 F)). 02/04/22   Remi Pippin, NP  B Complex-C-Folic Acid TABS Take 2 tablets by mouth daily.    [provider]  carvedilol  (COREG ) 3.125 MG tablet Take 1 tablet (3.125 mg total) by mouth 2 (two) times daily. 02/24/24   Lelon Hamilton T, PA-C  cyclobenzaprine  (FLEXERIL ) 10 MG tablet TAKE ONE TABLET BY MOUTH AT BEDTIME 11/23/23   Raulkar, Sven SQUIBB, MD  ELIQUIS  5 MG TABS tablet Take 1 tablet (5 mg total) by mouth 2 (two) times daily. 11/04/23   Nahser, Aleene PARAS, MD  famotidine  (PEPCID ) 20 MG tablet Take 1 tablet (20 mg total) by mouth 2 (two) times daily. 02/24/24   Lelon Hamilton T, PA-C  Ginkgo Biloba 120 MG CAPS Take 2 capsules by mouth daily.    [provider]  loratadine  (CLARITIN ) 10 MG tablet Take 1 tablet (10 mg total) by mouth daily. 02/05/22   Remi Pippin, NP  Magnesium 300 MG CAPS PT TAKES GUMMIES MAG BID    [provider]  mirabegron  ER (MYRBETRIQ ) 50 MG TB24 tablet Take 50 mg by mouth daily. 12/31/21   [provider]  Naltrexone HCl, Pain, 1.5 MG CAPS Take 1.5 mg by mouth daily at 6 (six) AM.    [provider]  potassium chloride  (KLOR-CON ) 10 MEQ tablet Take 1 tablet (10 mEq total) by mouth 2 (two) times daily. 12/22/23   Lelon Hamilton T, PA-C  pregabalin  (LYRICA ) 100 MG capsule Take 1 capsule (100 mg total) by mouth daily. 09/09/23   Raulkar, Sven SQUIBB, MD  pregabalin  (LYRICA ) 75 MG capsule Take 1 capsule (75 mg total) by mouth daily. 01/07/24   Raulkar, Sven SQUIBB, MD  rosuvastatin  (CRESTOR ) 20 MG tablet TAKE ONE TABLET BY MOUTH  DAILY 03/30/23   Nahser, Aleene PARAS, MD  Semaglutide -Weight Management (WEGOVY  [place]) Inject into the skin.    [provider]  spironolactone  (ALDACTONE ) 25 MG tablet Take 1/2 tablet (12.5 mg total) by mouth daily. 09/16/23   Nahser, Aleene PARAS, MD  topiramate  (TOPAMAX ) 25 MG tablet Take 1 tablet (25 mg total) by mouth daily as needed. 07/08/23   Raulkar, Sven SQUIBB, MD  topiramate  (TOPAMAX ) 50 MG tablet Take 1 tablet (50 mg total) by mouth 2 (two) times daily. 12/07/23   Raulkar, Sven SQUIBB, MD  UNABLE TO FIND Med Name: HAPPY HER SUPPLEMENT   SUPPORTS INFLAMMATION AND MOOD PER THE PT    [provider]  valsartan  (DIOVAN ) 80 MG tablet  Take 1 tablet (80 mg total) by mouth daily. 12/29/23   Lelon Glendia DASEN, PA-C    Physical Exam    Vital Signs:  GOLDYE TOURANGEAU does not have vital signs available for review today.  Given telephonic nature of communication, physical exam is limited. AAOx3. NAD. Normal affect.  Speech and respirations are unlabored.  Accessory Clinical Findings    None  Assessment & Plan    1.  Preoperative Cardiovascular Risk Assessment: According to the Revised Cardiac Risk Index (RCRI), her Perioperative Risk of Major Cardiac Event is (%): 6.6  Her Functional Capacity in METs is: 5.07 according to the [facility] Activity Status Index (DASI).  Therefore, based on ACC/AHA guidelines, patient would be at acceptable risk for the planned procedure without further cardiovascular testing. I will route this recommendation to the requesting party via Epic fax function.  The patient was advised that if she develops new symptoms prior to surgery to contact our office to arrange for a follow-up visit, and she verbalized understanding.  (Reminder: Include SBE prophylaxis/Antiplatelet/Anticoag Instructions***)  A copy of this note will be routed to requesting surgeon.  Time:   Today, I have spent *** minutes with the patient with telehealth technology discussing medical history,  symptoms, and management plan.     Lum LITTIE Louis, NP  03/23/2024, 8:56 AM

## 2024-03-23 NOTE — Progress Notes (Signed)
 Challenging case for preoperative clearance.  Patient's former cardiologist Dr. Alveta has since retired.  Due to patient's complex medical history will she will be set up for an office visit with Dr. Lonni.  Appointment set for 03/24/2024.

## 2024-03-23 NOTE — Telephone Encounter (Signed)
 Pharmacy recommends being seen by Dr.Christopher for pre-op clearance.   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 She would need to hold Eliquis  for 3 days.    She was a pt of Dr. Alveta. He has retired. She has been reassigned to Dr. Lonni, but she has not seen her.   Will need to defer to Dr. Lonni. Previously seen by hematology, but they felt she didn't need to follow long term.   Please make appt for clearance.

## 2024-03-24 ENCOUNTER — Ambulatory Visit (HOSPITAL_BASED_OUTPATIENT_CLINIC_OR_DEPARTMENT_OTHER): Admitting: Cardiology

## 2024-03-24 ENCOUNTER — Encounter (HOSPITAL_BASED_OUTPATIENT_CLINIC_OR_DEPARTMENT_OTHER): Payer: Self-pay | Admitting: Cardiology

## 2024-03-24 VITALS — BP 98/60 | HR 69 | Ht 60.0 in | Wt 123.6 lb

## 2024-03-24 DIAGNOSIS — I5181 Takotsubo syndrome: Secondary | ICD-10-CM | POA: Diagnosis not present

## 2024-03-24 DIAGNOSIS — I513 Intracardiac thrombosis, not elsewhere classified: Secondary | ICD-10-CM | POA: Diagnosis not present

## 2024-03-24 DIAGNOSIS — I6930 Unspecified sequelae of cerebral infarction: Secondary | ICD-10-CM | POA: Diagnosis not present

## 2024-03-24 DIAGNOSIS — I2510 Atherosclerotic heart disease of native coronary artery without angina pectoris: Secondary | ICD-10-CM

## 2024-03-24 DIAGNOSIS — Z01810 Encounter for preprocedural cardiovascular examination: Secondary | ICD-10-CM | POA: Diagnosis not present

## 2024-03-24 DIAGNOSIS — R898 Other abnormal findings in specimens from other organs, systems and tissues: Secondary | ICD-10-CM

## 2024-03-24 DIAGNOSIS — E782 Mixed hyperlipidemia: Secondary | ICD-10-CM

## 2024-03-24 DIAGNOSIS — Z7189 Other specified counseling: Secondary | ICD-10-CM

## 2024-03-24 LAB — LIPID PANEL
Chol/HDL Ratio: 2.5 ratio (ref 0.0–4.4)
Cholesterol, Total: 116 mg/dL (ref 100–199)
HDL: 46 mg/dL (ref >39)
LDL Chol Calc (NIH): 48 mg/dL (ref 0–99)
Triglycerides: 125 mg/dL (ref 0–149)
VLDL Cholesterol Cal: 22 mg/dL (ref 5–40)

## 2024-03-24 NOTE — Patient Instructions (Signed)
 Medication Instructions:  No changes *If you need a refill on your cardiac medications before your next appointment, please call your pharmacy*  Lab Work: Today: fasting lipid panel  Testing/Procedures: none  Follow-Up: At [facility], you and your health needs are our priority.  As part of our continuing mission to provide you with exceptional heart care, our providers are all part of one team.  This team includes your primary Cardiologist (physician) and Advanced Practice Providers or APPs (Physician Assistants and Nurse Practitioners) who all work together to provide you with the care you need, when you need it.  Your next appointment:   6 month(s)  Provider:   Shelda Bruckner, MD, Rosaline Bane, NP, or Reche Finder, NP

## 2024-03-24 NOTE — Progress Notes (Signed)
 Cardiology Office Note:  .   Date:  03/24/2024  ID:  Chloe Johnson, DOB Aug 09, 1955, MRN [id number] PCP: Dwight Trula SQUIBB, MD  [facility] Providers Cardiologist:  Shelda Bruckner, MD {  History of Present Illness: .   Chloe Johnson is a 68 y.o. female with PMH CVAx2, stress cardiomyopathy with LV thrombus, prothrombotic gene mutation (factor 2). She was previously followed by Dr. Alveta and established care with me on 03/24/24.   Pertinent CV history: Complex history related to clotting. Had CVA 12/2021, aphasia was symptom, MRI showed acute infarct L frontal lobe. Echo showed apical hypokinesis and LV thrombus, CVA felt to be initially embolic due to this. CT coronary showed very minimal CAD, so not 2/2 acute MI. Apical thrombus noted again. Felt to be stress induced cardiomyopathy, EF 45-50%. Further brain imaging noted left M2/M3 occlusion which was revascularized. She was started on apixaban . About a week later, she presented with acute right sided weakness. Underwent angiogram again, found to have occluded left M2. Decision made during that admission to change apixaban  to coumadin . Saw Dr. 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 She saw Dr. Timmy 05/05/22 for evaluation. Recommended for lifelong anticoagulation, cascade screening in children. Per a later note by Dr. Alveta, he discussed changing coumadin  back to apixaban  (rationale was that given short timeline between first and second stroke, likely not a true apixaban  failure), which per his note Dr. Timmy agreed with. Since that time she has been on apixaban . Repeat echo 02/2022 showed normalization of EF. No mention of LV thrombus.  Today: I spent extensive time reviewing her history in the chart, summarized as above. She is here for preoperative evaluation. Main question is regarding her anticoagulation prior to epidural  spinal injections. She does not need full preoperative evaluation as this is low risk/minimally invasive procedure, more a question regarding anticoagulation.  She reports that Dr. Alveta in the past told her it was ok to hold apixaban  for a few days prior to a procedure. She reports not having any other clotting issues other than the LV thrombus and strokes.   Has struggled with back pain. Tries to remain as active as she can. 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 Able to play tennis, sometimes moves furniture by herself.  Doesn't get lightheaded, blood pressure on the low side today. Sometimes falls due to her back issues but not because of lightheadedness/syncope. Checks BP intermittently at home. Has occasional mild LE edema, not severe.  She is on GLP and losing weight.  ROS: Denies chest pain, shortness of breath at rest or with normal exertion. No PND, orthopnea, or unexpected weight gain. No syncope or palpitations. ROS otherwise negative except as noted.   Studies Reviewed: SABRA    EKG:  EKG Interpretation Date/Time:  Thursday March 24 2024 08:16:11 EDT Ventricular Rate:  74 PR Interval:  134 QRS Duration:  80 QT Interval:  372 QTC Calculation: 412 R Axis:   63  Text Interpretation: Normal sinus rhythm Nonspecific T wave abnormality Confirmed by Bruckner Shelda [phone]) on 03/24/2024 8:40:32 AM    Physical Exam:   VS:  BP 98/60   Pulse 69   Ht 5' (1.524 m)   Wt 123 lb 9.6 oz (56.1 kg)   SpO2 97%   BMI 24.14 kg/m    Wt Readings from Last 3 Encounters:  03/24/24  123 lb 9.6 oz (56.1 kg)  01/07/24 136 lb (61.7 kg)  12/24/23 137 lb (62.1 kg)    GEN: Well nourished, well developed in no acute distress HEENT: Normal, moist mucous membranes NECK: No JVD CARDIAC: regular rhythm, normal S1 and S2, no rubs or gallops. No murmur. VASCULAR: Radial and DP pulses 2+ bilaterally. No carotid bruits RESPIRATORY:  Clear to auscultation without  rales, wheezing or rhonchi  ABDOMEN: Soft, non-tender, non-distended MUSCULOSKELETAL:  Ambulates independently SKIN: Warm and dry, no edema NEUROLOGIC:  Alert and oriented x 3. Residual slow speech from stroke PSYCHIATRIC:  Normal affect    ASSESSMENT AND PLAN: .    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 According to ACC/AHA Guidelines, no further testing is needed.  Proceed with surgery at acceptable risk.  Our service is available as needed in the peri-operative period.    -I am not familiar with the risk related to her genetic mutation; I will ask Dr. Timmy for his opinion on whether there needs to be a different strategy regarding her apixaban  periprocedurally beyond our typical recommendation of 2-3 day hold -she denies other thrombotic events beyond those noted  History of stress cardiomyopathy 2023 -unclear trigger for this.  -trivial plaque, not a type I MI based on CT -LV apical thrombus associated with this -function normalized on repeat echo -currently on carvedilol , spironolactone , valsartan . Blood pressure is low today, but if she becomes lightheaded, would cut back on valsartan  first.  Minimal CAD on CT Mixed hyperlipidemia -on rosuvastatin , tolerates well -LDL goal <70, lipids per [facility] 07/15/23 show Tchol 184, HDL 59, LDL 95, TG 176. This has likely improved with weight loss as she reports not eating much  -not on aspirin  as she is on apixaban  -recheck fasting lipids today, if not at goal we discussed increasing rosuvastatin   CV risk counseling and prevention -recommend heart healthy/Mediterranean diet, with whole grains, fruits, vegetable, fish, lean meats, nuts, and olive oil. Limit salt. -recommend moderate walking, 3-5 times/week for 30-50 minutes  each session. Aim for at least 150 minutes/week. Goal should be pace of 3 miles/hours, or walking 1.5 miles in 30 minutes -recommend avoidance of tobacco products. Avoid excess alcohol.  Dispo: 6 months or sooner as needed  Total time of encounter: I spent 42 minutes dedicated to the care of this patient on the date of this encounter to include pre-visit review of records, face-to-face time with the patient discussing conditions above, and clinical documentation with the electronic health record. We specifically spent time today discussing her history, genetic mutation and unclear risk, preoperative discussion, review of recommendations regarding cardiomyopathy history, cad, and lipids. Time also spent communicating with care team.   Signed, Shelda Bruckner, MD   Shelda Bruckner, MD, PhD, [facility]  [facility]  [place]  [facility] at [facility] at [facility] [address] [place], [place] [address] [phone]

## 2024-03-25 ENCOUNTER — Other Ambulatory Visit: Payer: Self-pay | Admitting: Physical Medicine and Rehabilitation

## 2024-03-25 DIAGNOSIS — M62838 Other muscle spasm: Secondary | ICD-10-CM

## 2024-03-28 ENCOUNTER — Encounter: Attending: Physical Medicine and Rehabilitation

## 2024-03-28 DIAGNOSIS — I639 Cerebral infarction, unspecified: Secondary | ICD-10-CM | POA: Diagnosis not present

## 2024-03-28 DIAGNOSIS — R252 Cramp and spasm: Secondary | ICD-10-CM | POA: Diagnosis present

## 2024-03-28 DIAGNOSIS — R4701 Aphasia: Secondary | ICD-10-CM | POA: Diagnosis present

## 2024-03-28 NOTE — Progress Notes (Signed)
 Mental Status/Behavioral Observations (03/28/2024):  Orientation: The patient was oriented to self, place, and time. Sensory/Arousal: Hearing and vision were adequate for testing. The patient was alert. Appearance: Dress and hygiene were appropriate for the setting.  Speech/Language:  In conversation, the patient's speech was aphasic and dysarthric with reduced loudness. The patient displayed some indications of word finding difficulties. No word substitution errors were observed.  Motor: The patient ambulated independently and without issue. No tremors were observed. The patient utilized her non-dominant (left) hand throughout testing due to spasticity/pain in her right dominant hand.   Social Comportment: Social behavior was appropriate to the setting. Mood/Affect: Mood was largely neutral. Affect was consistent with mood. The patient reported some anxiety at the beginning of the testing session, but this appeared to subside as testing progressed.  Attention/Concentration:  The patient appeared to maintain consistent engagement throughout the testing session. No frank attentional lapses were observed.  Thought Process/Content: The patient's thought process was coherent, linear, goal directed. There were no indications of psychosis.  Additional Observations:  The patient showed minimal difficulties with understanding task instructions. Some mild difficulties with frustration tolerance were noted, and the patient reported feeling humiliated by speech difficulties.  Neuropsychology Note LATIKA KRONICK completed 175 minutes of neuropsychological testing with technician, Josue Ned, BA, under the supervision of Evalene Riff, PsyD., Clinical Neuropsychologist. The patient did not appear overtly distressed by the testing session, per behavioral observation or via self-report to the technician. Rest breaks were offered.   Clinical Decision Making: In considering the patient's current level of  functioning, level of presumed impairment, nature of symptoms, emotional and behavioral responses during clinical interview, level of literacy, and observed level of motivation/effort, a battery of tests was selected by Dr. Riff during initial consultation on 03/15/2024. This was communicated to the technician. Communication between the neuropsychologist and technician was ongoing throughout the testing session and changes were made as deemed necessary based on patient performance on testing, technician observations and additional pertinent factors such as those listed above.  Tests Administered: Boston Diagnostic Aphasia Examination (BDAE); select subtests [place] Naming Test-Second Edition (BNT-2) Brief Visuospatial Memory Test-Revised (BVMT-R) [place]  Verbal Learning Test-Third Edition (CVLT-3) Clock Drawing Test Controlled Oral Word Association Test (FAS & Animals) Delis-Kaplan Executive Function System (D-KEFS), select subtests Repeatable Battery for the Assessment of Neuropsychological Status Update (RBANS) Wechsler Adult Intelligence Scale-Fourth Edition (WAIS-IV), select subtests Wechsler Memory Scale-Fourth Edition (WMS-IV) , select subtests Wechsler Memory Scale-Third Edition (WMS-III), select subtests  Wechsler Test of Adult Reading (WTAR) The Beck Depression Inventory-II (BDI-II) Beck Anxiety Inventory (BAI) PTSD Checklist for DSM-5 (PCL-5) (To be completed at feedback appointment)   Results: Note: This summary of test scores accompanies the interpretive report and should not be interpreted by unqualified individuals or in isolation without reference to the report. Test scores are relative to age, gender, and educational history as available and appropriate. Measurement properties of test scores: IQ, Index, and Standard Scores (SS): Mean = 100; Standard Deviation = 15; Scaled Scores (ss): Mean = 10; Standard Deviation = 3; Z scores (Z): Mean = 0; Standard Deviation = 1; T scores  (T); Mean = 50; Standard Deviation = 10  Intellectual/Premorbid Functioning Estimate   Norm Score Percentile Range  Wechsler Test of Adult Reading  SS = 111 77 %ile High Average   ATTENTION AND WORKING MEMORY    Norm Score Percentile  Range  WAIS-IV          Digit Span  ss = 6 9 %  ile Low Average   DSF  ss = 8 25 %ile Average   Span:    5      DSB  ss = 5 5 %ile Below Average   Span:    2      DSS  ss = 7 16 %ile Low Average   Span:    4     WMS-III          Spatial Span  ss = 6 9 %ile Low Average   SSF  ss = 5 5 %ile Below Average   Span:    4      SSB  ss = 8 25 %ile Average   Span:    4      LANGUAGE    Norm Score Percentile  Range  [place] Naming Test (BNT-2)  t = 36 8 %ile Below Average  COWAT          FAS  t = 17 <0.1 %ile Exceptionally Low   Animals  t = 31 3 %ile Below Average  BDAE          Commands          14/15          Complex Ideational Material          9/12          Repetition of Sentences          8/10          EXECUTIVE FUNCTIONING    Norm Score Percentile  Range  DKEFS - Color-Word Interference          Color Naming  ss = 1 0.1 %ile Exceptionally Low   Word Reading  ss = 1 0.1 %ile Exceptionally Low   Inhibition  ss = 8 25 %ile Average   Errors  ss = 12 75 %ile High Average   Inhibition Switching  ss = 1 0.1 %ile Exceptionally Low   Errors  ss = 7 16 %ile Low Average  DKEFS - Trails          Condition 1  ss = 9 37 %ile Average   Condition II  ss = 9 37 %ile Average   Condition III  ss = 1 0.1 %ile Exceptionally Low   Condition IV  ss = 1 0.1 %ile Exceptionally Low   Condition V  ss = 12 75 %ile High Average  DKEFS - Design Fluency          Condition 1  ss = 9 37 %ile Average   Condition II  ss = 7 16 %ile Low Average   Condition III  ss = 11 63 %ile Average   MEMORY    Norm Score Percentile  Range  BVMT-R          Trial 1  t = 35.0 7 %ile Below Average   Trial 2  t = 23 0.3 %ile Exceptionally Low   Trial 3  t = 27.0 1 %ile Exceptionally Low    Total Recall  t = 25.0 1 %ile Exceptionally Low   Learning  t = 41.0 18 %ile Low Average   Delayed Recall  t = 27.0 1 %ile Exceptionally Low   % Retained    75 11-16 %ile Low Average   Hits     6-10 %ile Low to Below Average   False Alarms     >16 %ile WNL   Recognition Discriminability     6-10 %ile  Low to Below Average  CVLT-III          Trial 1  ss = 8.0 25 %ile Average   Trial 2  ss = 6.0 9 %ile Low Average   Trial 3  ss = 9.0 37 %ile Average   Trial 4  ss = 5.0 5 %ile Below Average   Trial 5  ss = 9.0 37 %ile Average   Trial B  ss = 11.0 63 %ile Average   Short Delay Free Recall  ss = 2.0 0.4 %ile Exceptionally Low   Short Delay Cued Recall  ss = 2.0 0.4 %ile Exceptionally Low   Long Delay Free Recall  ss = 2.0 0.4 %ile Exceptionally Low   Long Delay Cued Recall  ss = 2.0 0.4 %ile Exceptionally Low   Total Hits  ss = 14.0 91 %ile Above Average   Total False Positives  ss = 7.0 16 %ile Low Average   Recognition Discriminability  ss = 9.0 37 %ile Average   Total Intrusions  ss = 9.0 37 %ile Average   Trials 1-5 Total Correct  SS = 85 16 %ile Low Average   Total Repetitions  ss = 9.0 37 %ile Average   List B vs. Trial 1  ss = 12.0 75 %ile High Average   SD (FR) vs. Trial 5 Correct  ss = 1.0 0.1 %ile Exceptionally Low   LD (FR)vs. SD (FR)  ss = 6.0 9 %ile Low Average  Wechsler Memory Scale, 4th Edition (WMS-4)         Log. Mem. Immediate Recall  ss = 6 9 %ile Low Average   Logical Memory Delayed Recall  ss = 4 2 %ile Below Average   Logical Recognition    10th-16th  %ile Low Average   VISUAL-SPATIAL    Norm Score Percentile  Range  Clock       WNL            RBANS Visuospatial Index          RBANS Figure Copy  ss = 10 50 %ile Average   RBANS Line Orientation      >75th  %ile WNL   PERSONALITY AND BEHAVIORAL FUNCTIONING      Score/Interpretation  BDI Raw       4  BDI Severity       Minimal.  BAI Raw       4  BAI Severity       Minimal.   Feedback to Patient: ALIZAY BRONKEMA  will return on 04/12/2024 for an interactive feedback session with Dr. Hayden at which time her test performances, clinical impressions and treatment recommendations will be reviewed in detail. The patient understands she can contact our office should she require our assistance before this time.  175 minutes spent face-to-face with patient administering standardized tests, 65 minutes spent scoring radiographer, therapeutic). [CPT [id number], [id number]]  Full report to follow.

## 2024-03-29 ENCOUNTER — Other Ambulatory Visit: Payer: Self-pay | Admitting: Cardiology

## 2024-03-31 MED ORDER — ROSUVASTATIN CALCIUM 20 MG PO TABS: 20.0000 mg | ORAL_TABLET | Freq: Every day | ORAL | 3 refills | Status: AC

## 2024-04-01 ENCOUNTER — Encounter (HOSPITAL_BASED_OUTPATIENT_CLINIC_OR_DEPARTMENT_OTHER): Payer: Self-pay

## 2024-04-05 ENCOUNTER — Encounter: Admitting: Psychology

## 2024-04-07 ENCOUNTER — Encounter (HOSPITAL_BASED_OUTPATIENT_CLINIC_OR_DEPARTMENT_OTHER): Payer: Self-pay | Admitting: *Deleted

## 2024-04-07 NOTE — Telephone Encounter (Signed)
   Patient Name: Chloe Johnson  DOB: 23-Dec-1955 MRN: [id number]  Primary Cardiologist: Shelda Bruckner, MD  Chart reviewed as part of pre-operative protocol coverage. Given past medical history and time since last visit, based on ACC/AHA guidelines, Chloe Johnson is at acceptable risk for the planned procedure without further cardiovascular testing.   Eliquis  reviewed by Dr. Timmy with the following recommendation:  There really is very little risk for holding her Eliquis  2 or 3 days before a procedure.  She has the prothrombin 2 gene mutation.  This is one of the lesser risk mutations that we see.  As such, I really do not see a problem with her being off Eliquis  for 2 or 3 days before any procedure.   Per Dr. Bruckner note 03/24/24 The patient is not currently having active cardiac symptoms, and they can achieve >4 METs of activity. According to ACC/AHA Guidelines, no further testing is needed. Proceed with surgery at acceptable risk. Our service is available as needed in the peri-operative period.   I will route this recommendation to the requesting party via Epic fax function.  Please call with questions.  Reche GORMAN Finder, NP 04/07/2024, 2:56 PM

## 2024-04-08 ENCOUNTER — Encounter: Payer: Self-pay | Admitting: Physical Medicine and Rehabilitation

## 2024-04-08 ENCOUNTER — Encounter (HOSPITAL_BASED_OUTPATIENT_CLINIC_OR_DEPARTMENT_OTHER): Admitting: Physical Medicine and Rehabilitation

## 2024-04-08 VITALS — BP 90/51 | HR 81 | Ht 60.0 in | Wt 114.8 lb

## 2024-04-08 DIAGNOSIS — R252 Cramp and spasm: Secondary | ICD-10-CM

## 2024-04-08 DIAGNOSIS — R4701 Aphasia: Secondary | ICD-10-CM | POA: Diagnosis not present

## 2024-04-08 MED ORDER — SODIUM CHLORIDE (PF) 0.9 % IJ SOLN
2.0000 mL | Freq: Once | INTRAMUSCULAR | Status: AC
  Administered 2024-04-08: 2 mL

## 2024-04-08 MED ORDER — ONABOTULINUMTOXINA 100 UNITS IJ SOLR
200.0000 [IU] | Freq: Once | INTRAMUSCULAR | Status: AC
  Administered 2024-04-08: 200 [IU] via INTRAMUSCULAR

## 2024-04-08 NOTE — Progress Notes (Addendum)
 Botox  Injection for spasticity using needle US  guidance   Indication: Severe spasticity which interferes with ADL,mobility and/or  hygiene and is unresponsive to medication management and other conservative care Informed consent was obtained after describing risks and benefits of the procedure with the patient. This includes bleeding, bruising, infection, excessive weakness, or medication side effects. A REMS form is on file and signed.  RUE: Biceps 100 divided into 2 spots 10U into each lumbrical and 30 in abductor pollicis, 20 in opponens pollicis All injections were done after obtaining appropriate EMG activity and after negative drawback for blood. The patient tolerated the procedure well. Post procedure instructions were given. A followup appointment was made.    Zynex lumboscaral brace ordered ordered for lower back pain

## 2024-04-12 ENCOUNTER — Encounter: Admitting: Psychology

## 2024-04-12 ENCOUNTER — Ambulatory Visit (HOSPITAL_BASED_OUTPATIENT_CLINIC_OR_DEPARTMENT_OTHER): Payer: Self-pay | Admitting: Family

## 2024-04-12 NOTE — Progress Notes (Unsigned)
 NEUROPSYCHOLOGICAL EVALUATION [place]. [facility]  Physical Medicine and Rehabilitation     Patient: Chloe Johnson  MRN: [id number] DOB: May 25, 1956  Age: 68 y.o. Sex: female  Race/Ethnicity: Ches[place] or Caucasian  Years of Education: 16 Handedness: Right  Referring Provider: Dwight Trula SQUIBB, MD  Provider/Clinical Neuropsychologist: Evalene DOROTHA Riff, PsyD  Date of Service: 04/05/24 Start Time: 10 AM End Time: 11 AM  Location of Service:  [facility] Physical Medicine & Rehabilitation Department [place]. [facility] [address] [place]. [address] [place], [place] [address] Phone: [phone]  Billing Code/Service: [phone]  Individuals Present: Evalene Riff, PsyD 1 hour was spent on interpretation of patient data, interpretation of standardized test results and clinical data, clinical decision making, initial treatment planning/recommendations, and report writing. The report will be amended as needed based on any additional information collected during interactive feedback session.   SALIENT MEDICAL RECORDS & REASON FOR REFERRAL:The patient was referred for neuropsychological evaluation by her physiatrist, Dr. Lorilee, for cognitive evaluation. 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 status post thrombectomy... 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 She lives with spouse.  Independent prior to admission. Records go on to state the patient Presented 01/28/2022 with acute onset of right-sided weakness and aphasia... recently returned from traveling to [place]  5 days ago... CT/MRI showed new acute infarct in left posterior frontal and anterior parietal cortex... Underwent bilateral common carotid arteriogram as well as right vertebral artery angiogram  showing occluded superior division of left MCA artery in the M2 region and underwent mechanical thrombectomy for revascularization...  She was seen by neurology service as well as cardiology services and was noted to have history of recent CVA secondary to LV thrombus. Following inpatient rehabilitation, the patient was discharge home.   The patient was seen for outpatient neurology follow up on 03/31/2022. Visit notes indicated She works as a astronomer, work from home, January 16, 2022 she was traveling at a slight monitoring, noticed dizziness, then diarrhea, vomiting, she was still able to push herself to work. January 21, 2022, she had sudden onset right hand weakness, dropped the plate, transient speech difficulty lasting for 30 minutes, husband also noted mild right facial droop, symptoms were transient, she was able to bounce back to baseline. MRI of the brain without contrast... small acute infarction at the left frontal P[place] matter... Same evening...after admitted...noted to have word finding difficulty. CT angiogram of head and neck showed occluded left M2 MCA, perfusion identified critically hypoperfused parenchyma within the left insular/frontal lobe, reported mismatch volume 7 mm. She had a successful mechanical thrombectomy for distal M2 MCA occlusion with direct contact aspiration achieving complete recanalization. When she wake up from anesthesia, she denies focal symptoms. Repeat MRI of the brain January 22, 2022 showed no punctuated acute infarction in the left external capsule, insula, frontal lobe cortex, and previous noted left frontal lobe acute infarction. Later On January 28, 2022, while she was checking her husband's blood pressure, she suddenly slumped over, developed language difficulty right-sided weakness, was taken to hospital by ambulance...new acute infarction in the left posterior frontal anterior parietal cortex, left MCA branch appear perfused and without  focal stenosis... January 28, 2022 endovascular revascularization of occluded division of left MCA achieving a TICI 2C revascularization of the left MCA distribution, she was intubated due to poor responsiveness following reversal of anesthesia.  She insists she came around from intubation and anesthesia, she had a persistent deficit, aphasia and word finding difficulties, no comprehensive difficulty, also has right arm more than left weakness, over the past 2 months, she had some progression, not back to baseline yet.  Follow-up neurology visit on 08/04/2022 indicated She continues to improve, but she continues to feel right hand is clumsy, feeling is coming back, expressive aphasia, no comprehensive trouble, it takes a while to get her words out, difficulty spelling... MRI of the brain from November 2023, chronic left posterior frontal/parietal stroke, no new lesion, she works as a higher education careers adviser, is on disability, independent in daily activity now. Subsequent EEG was reportedly unremarkable. Records indicate the patient underwent sleep study in early 2024 and results were negative for OSA. SLP notes from 10/01/23 noted improvements in aphasia/communication through the therapy. Conversation was reported to be nearly at [facility].  Upon interview, the patient indicated she was interested in undergoing the evaluation to assess her current cognitive functioning to determine where she is at with respect to cognitive changes from the stroke.  She also indicated some interest in possible future reevaluation to assess for change over time.  HISTORY OF PRESENTING CONCERNS: The following was obtained via interview. The patient readily answered all questions posed. There was some indication of word finding difficulties. Receptive language appeared intact overall. That said, there were a couple of instances in which her response did not clearly align with the question posed. This may have been due to slight  tangentiality, difficulties with recall, or distraction, but receptive speech changes must also be considered. Some aspects of the interview were abbreviated due to time constraints and tangentiality, but this information will be obtained at follow-up during the feedback visit. Information needed for test protocol construction was obtained in full.    Cognitive Symptom Onset & Course: The patient was functioning well and fully independently prior to her stroke in 2023.  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  She is currently on disability. The patient described improvements in her symptoms over time, but some residual difficulties persist.    Current Cognitive Complaints:  Memory: Improved but still some difficulties. Misplaces things, relies on calendar/phone for tracking medical appointments. Minimal difficulty in tracking medications. No difficulties getting lost in familiar places.   Processing Speed: Slowed relative to premorbid baseline.   Attention & Concentration: Increased distractibility and some possible challenges with concentration.   Language: Expressive language is still impacted by some mild aphasia. Receptive speech appeared grossly intact in conversation. Some difficulties with articulation (dysarthria), reduced loudness, and word-finding. She described always having some level of difficulty understanding others, although the provider was unable to clarify.   Visual-Spatial: Slight possible difficulties implied by problems with reading that are reduced on the phone due to, per her description, narrower field of view. No difficulties driving or navigating locations on foot.   Executive Functioning: No marked impairments in impulse control or judgment per her report. She indicated that others thought she was well organized historically, although she offered that this was a means of compensating for premorbid cognitive  difficulties.   Motor/Sensory Complaints:  Sensory changes: Hearing is reduced in her left ear. She has some vision difficulties.  Balance/coordination difficulties:Improved via PT.  Frequent instances of dizziness/vertigo: None.  Other motor difficulties: Spasticity and pain are significant and impact her right (dominant) arm/hand.   Emotional and Behavioral Functioning:  History: The patient endorsed  history of traumatic events throughout her life dating back to childhood. Details were not explored regarding events in early life. She was amenable to completing a PTSD questionnaire during follow up. The patient described engaging with mental health during adulthood during adulthood, particularly when caring for her young children. She described traumatic events related to the loss of family members under complex and atypically distressing circumstances. The patient had been engaged in individual therapy prior to the stroke, and is considering returning.  Depression: She described some indications of possible depression history. She attributed more recent changes in mood related to GLP1, but indicated that this has improved in recent weeks.  Anxiety: Significant and life-long difficulties with anxiety, particualrly in social situations.  Other: Denied problems with hallucination, suicidal ideation, and homicidal ideation. Her descriptions of paranoia appear to reflect anxiety rather than psychosis. She reported a past history of OCD like counting behavior that reflected acute anxiety. She indicated that is not current present.   Sleep: 6 hours per night. No significant struggles with onset or maintenance indicated.  Appetite: Reduced via medication. Caffeine: Minimal  Alcohol Use: Limited.  Tobacco Use: None Recreational Substance Use: Rare THC consumption.    Level of Functional Independence: The patient is intact with basic and instrumental activities of daily living overall. Some possible  compensatory strategy use for tracking appointments, although this may be within normal limits.    Medical History/Record Review: Per records and patient report, History of traumatic brain injury/concussion: Suspected 2x concussions in early life.    History of stroke: See above   History of heart attack: See above   History of cancer/chemotherapy:None   History of seizure activity: None   Symptoms of chronic pain: Significant.    Experience of frequent headaches/migraines: None    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/16/2013   L3-4/notes 11/24/2013, arms, neck   Exertional asthma    GERD (gastroesophageal reflux disease)    Heart attack (HCC) 01/18/2022   Hypertension    Kidney stones    have always passed them   Neck pain    Osteomyelitis (HCC) 11/23/2013   osteomyelitis, discitis    Sleep concern    uses Prozac  for sleep    Stroke (HCC) 01/21/2022   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; left with aphasia, dysarthria, and apraxia   Patient Active Problem List   Diagnosis Date Noted   Aphasia 03/31/2022   Snores 03/31/2022   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   UTI (urinary tract infection) 02/04/2022   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   Stroke determined by clinical assessment (HCC) 01/28/2022   Middle cerebral artery embolism, left 01/28/2022   H/O ischemic left MCA stroke    Acute respiratory failure (HCC)    CVA (cerebral vascular accident) (HCC) 01/21/2022   NSTEMI (non-ST elevated myocardial infarction) (HCC) 01/21/2022   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   HFrEF (heart failure with reduced ejection fraction) (HCC) 01/21/2022   Hyperlipidemia 01/21/2022   Lumbar stenosis 01/21/2022   Stroke (cerebrum) (HCC) 01/21/2022   S/P lumbar spinal fusion 11/15/2014   Lumbar discitis 11/29/2013   Discitis 11/24/2013   Diarrhea 11/17/2013   Osteomyelitis of lumbar spine (HCC) 11/16/2013    Hypertension    Asthma    Anxiety    Family Neurologic/Medical Hx:  Family History  Problem Relation Age of Onset   Other Mother    Other Father  Hypertension Father    Cancer Sister    Sleep apnea Sister    Medications:  acetaminophen  (TYLENOL ) 325 MG tablet B Complex-C-Folic Acid TABS carvedilol  (COREG ) 3.125 MG tablet cyclobenzaprine  (FLEXERIL ) 10 MG tablet ELIQUIS  5 MG TABS tablet famotidine  (PEPCID ) 20 MG tablet Ginkgo Biloba 120 MG CAPS loratadine  (CLARITIN ) 10 MG tablet Magnesium 300 MG CAPS mirabegron  ER (MYRBETRIQ ) 50 MG TB24 tablet Naltrexone HCl, Pain, 1.5 MG CAPS potassium chloride  (KLOR-CON ) 10 MEQ tablet pregabalin  (LYRICA ) 100 MG capsule pregabalin  (LYRICA ) 75 MG capsule rosuvastatin  (CRESTOR ) 20 MG tablet Semaglutide -Weight Management (WEGOVY  [place]) spironolactone  (ALDACTONE ) 25 MG tablet topiramate  (TOPAMAX ) 25 MG tablet topiramate  (TOPAMAX ) 50 MG tablet valsartan  (DIOVAN ) 80 MG tablet   Academic/Vocational History: Graduated high school and went on to earn her bachelor's in nursing school. She described some indications of difficulty with attention and concentration, reading, and maths. 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 She described having a desire to return to work, but recognizing that her cognitive and communication difficulties preclude this, in addition to physical/motor/mobility changes (traveled a lot for work). 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   Psychosocial: Marital Status: Married for a total of 32 years.  Children/Grandchildren: 3 children and 4 grandchildren.  Living Situation: Lives with spouse.  Daily Activities/Hobbies: Stays relatively active.   Mental Status/Behavioral Observations: The patient was seen on an outpatient basis in the [facility] PM&R office for the clinical interview unaccompanied.   Sensorium/Arousal: Alert. Hearing is reportedly reduced in her left ear. She was able to hear adequately for the interview and hearing is adequate for the purpose of cognitive testing. Vision is adequate for the purpose of testing.  Orientation: Full.  Appearance: Appropriate dress and hygiene.  Behavior: Cooperative and friendly.  Speech/Language: Dysarthric, slight hypophonia. Prosody was intact. Speech was fluent outside of interference from word-finding. Receptive language was largely unremarkable, but a couple of instances in which it was unclear that the question was fully understood based on her response (question was repeated once).  Motor: Ambulated independently. Spasticity and pain impacting her right (dominant) arm/hand.  Social Comportment: Within normal limits.  Mood: Euthymic.  Affect: Congruent.  Thought Process/Content: No indications of psychosis.  Ability to Participate in Interview: Readily answered most questions with adequate detail from personal history. Some slight redirection was required due to minor (but topically appropriate) tangentiality. She responded to this well/did not show clear deficits in inhibitory control.  Insight: Good.   SUMMARY / CLINICAL IMPRESSIONS The patient was referred for neuropsychological evaluation by her physiatrist, Dr. Lorilee, for cognitive evaluation. 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 status post thrombectomy... 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 She lives with spouse.  Independent prior to admission. Records go on to state the patient Presented 01/28/2022 with acute onset of right-sided weakness and aphasia... recently returned from traveling to [place]  5 days ago... CT/MRI showed new acute infarct in left  posterior frontal and anterior parietal cortex... Underwent bilateral common carotid arteriogram as well as right vertebral artery angiogram showing occluded superior division of left MCA artery in the M2 region and underwent mechanical thrombectomy for revascularization...  She was seen by neurology service as well as cardiology services and was noted to have history of recent CVA secondary to LV thrombus.  Following inpatient rehabilitation, the patient was discharge home.   The patient was seen for outpatient neurology follow up on 03/31/2022. Visit notes indicated She works as a astronomer, work from home, January 16, 2022 she was traveling at a slight monitoring, noticed dizziness, then diarrhea, vomiting, she was still able to push herself to work. January 21, 2022, she had sudden onset right hand weakness, dropped the plate, transient speech difficulty lasting for 30 minutes, husband also noted mild right facial droop, symptoms were transient, she was able to bounce back to baseline. MRI of the brain without contrast... small acute infarction at the left frontal Sil[place]ro matter... Same evening...after admitted...noted to have word finding difficulty. CT angiogram of head and neck showed occluded left M2 MCA, perfusion identified critically hypoperfused parenchyma within the left insular/frontal lobe, reported mismatch volume 7 mm. She had a successful mechanical thrombectomy for distal M2 MCA occlusion with direct contact aspiration achieving complete recanalization. When she wake up from anesthesia, she denies focal symptoms. Repeat MRI of the brain January 22, 2022 showed no punctuated acute infarction in the left external capsule, insula, frontal lobe cortex, and previous noted left frontal lobe acute infarction. Later On January 28, 2022, while she was checking her husband's blood pressure, she suddenly slumped over, developed language difficulty right-sided weakness, was taken to  hospital by ambulance...new acute infarction in the left posterior frontal anterior parietal cortex, left MCA branch appear perfused and without focal stenosis... January 28, 2022 endovascular revascularization of occluded division of left MCA achieving a TICI 2C revascularization of the left MCA distribution, she was intubated due to poor responsiveness following reversal of anesthesia. She insists she came around from intubation and anesthesia, she had a persistent deficit, aphasia and word finding difficulties, no comprehensive difficulty, also has right arm more than left weakness, over the past 2 months, she had some progression, not back to baseline yet.  Follow-up neurology visit on 08/04/2022 indicated She continues to improve, but she continues to feel right hand is clumsy, feeling is coming back, expressive aphasia, no comprehensive trouble, it takes a while to get her words out, difficulty spelling... MRI of the brain from November 2023, chronic left posterior frontal/parietal stroke, no new lesion, she works as a higher education careers adviser, is on disability, independent in daily activity now. Subsequent EEG was reportedly unremarkable. Records indicate the patient underwent sleep study in early 2024 and results were negative for OSA. SLP notes from 10/01/23 noted improvements in aphasia/communication through the therapy. Conversation was reported to be nearly at [facility].  Upon interview, the patient indicated she was interested in undergoing the evaluation to assess her current cognitive functioning to determine where she is at with respect to cognitive changes from the stroke.  She also indicated some interest in possible future reevaluation to assess for change over time.  The patient appears to have improved significantly. Some aphasia may still persist, but communication is minimally impacted. She describes some difficulties with attention that may be declines from premorbid weakness, and minor short  term memory difficulties. She has a significant psychiatric history and there is some concern for notable anxiety at present. Both areas will be further evaluated, along with assessment of language. The evaluation will be beneficial as she does not appear to have had formal cognitive testing since her stroke. Data will clarify current cognitive functioning and guide treatment recommendations and planning. She expressed interest in future re-evaluation to assess for change over time, which would also be possible though comparison  to a baseline established in this evaluation. Assessing variability over time would also be beneficial for treatment planning.  DISPOSITION / PLAN The patient has been set up for a formal neuropsychological assessment to objectively assess her cognitive functioning across domains to establish the patient's cognitive profile. This data, in conjunction with information obtained via clinical interview and medical record review, will help clarify likely etiology and guide treatment recommendations. Once data collection and interpretation have been completed, the findings / diagnosis and recommendations will be reviewed and discussed with the patient during a feedback appointment with the neuropsychologist. Based on the collaborative dialogue with the patient during the feedback, recommendations may be adjusted / tailored as needed. A formal report will be produced and provided to the patient and the referring provider.   Diagnosis: CVA (cerebral vascular accident) (HCC) Aphasia   Evalene DOROTHA Riff, PsyD Clinical Neuropsychology [address], [address] [place], [place] [address] Main: [phone] Fax: [phone]  This report was generated using voice recognition software. While this document has been carefully reviewed, transcription errors may be present. I apologize in advance for any inconvenience. Please contact me if further clarification is needed.     NEUROPSYCHODIAGNOSTIC FINDINGS:    Recommendations:              Evalene DOROTHA Riff, PsyD             Neuropsychologist This report was generated using voice recognition software. While this document has been carefully reviewed, transcription errors may be present. I apologize in advance for any inconvenience. Please contact me if further clarification is needed.

## 2024-04-15 ENCOUNTER — Emergency Department (HOSPITAL_COMMUNITY)

## 2024-04-15 ENCOUNTER — Emergency Department (HOSPITAL_COMMUNITY)
Admission: EM | Admit: 2024-04-15 | Discharge: 2024-04-15 | Disposition: A | Discharge status: Home / Self Care | Source: Home / Self Care | Attending: Emergency Medicine | Admitting: Emergency Medicine

## 2024-04-15 ENCOUNTER — Encounter (HOSPITAL_COMMUNITY): Payer: Self-pay

## 2024-04-15 ENCOUNTER — Other Ambulatory Visit: Payer: Self-pay

## 2024-04-15 DIAGNOSIS — D72829 Elevated white blood cell count, unspecified: Secondary | ICD-10-CM | POA: Insufficient documentation

## 2024-04-15 DIAGNOSIS — Z79899 Other long term (current) drug therapy: Secondary | ICD-10-CM | POA: Insufficient documentation

## 2024-04-15 DIAGNOSIS — Z7901 Long term (current) use of anticoagulants: Secondary | ICD-10-CM | POA: Insufficient documentation

## 2024-04-15 DIAGNOSIS — I110 Hypertensive heart disease with heart failure: Secondary | ICD-10-CM | POA: Insufficient documentation

## 2024-04-15 DIAGNOSIS — I5020 Unspecified systolic (congestive) heart failure: Secondary | ICD-10-CM | POA: Insufficient documentation

## 2024-04-15 DIAGNOSIS — N12 Tubulo-interstitial nephritis, not specified as acute or chronic: Secondary | ICD-10-CM | POA: Insufficient documentation

## 2024-04-15 DIAGNOSIS — Z8673 Personal history of transient ischemic attack (TIA), and cerebral infarction without residual deficits: Secondary | ICD-10-CM | POA: Insufficient documentation

## 2024-04-15 LAB — URINALYSIS, ROUTINE W REFLEX MICROSCOPIC
Bacteria, UA: NONE SEEN
Bilirubin Urine: NEGATIVE
Glucose, UA: NEGATIVE mg/dL
Ketones, ur: 80 mg/dL — AB
Nitrite: NEGATIVE
Protein, ur: 100 mg/dL — AB
Specific Gravity, Urine: 1.017 (ref 1.005–1.030)
WBC, UA: >50 WBC/hpf (ref 0–5)
pH: 6 (ref 5.0–8.0)

## 2024-04-15 LAB — CBC
HCT: 45.2 % (ref 36.0–46.0)
Hemoglobin: 15.1 g/dL — ABNORMAL HIGH (ref 12.0–15.0)
MCH: 28.9 pg (ref 26.0–34.0)
MCHC: 33.4 g/dL (ref 30.0–36.0)
MCV: 86.4 fL (ref 80.0–100.0)
Platelets: 313 K/uL (ref 150–400)
RBC: 5.23 MIL/uL — ABNORMAL HIGH (ref 3.87–5.11)
RDW: 13.2 % (ref 11.5–15.5)
WBC: 15.6 K/uL — ABNORMAL HIGH (ref 4.0–10.5)
nRBC: 0 % (ref 0.0–0.2)

## 2024-04-15 LAB — LIPASE, BLOOD: Lipase: 71 U/L — ABNORMAL HIGH (ref 11–51)

## 2024-04-15 LAB — COMPREHENSIVE METABOLIC PANEL WITH GFR
ALT: 14 U/L (ref 0–44)
AST: 23 U/L (ref 15–41)
Albumin: 4.4 g/dL (ref 3.5–5.0)
Alkaline Phosphatase: 49 U/L (ref 38–126)
Anion gap: 21 — ABNORMAL HIGH (ref 5–15)
BUN: 21 mg/dL (ref 8–23)
CO2: 14 mmol/L — ABNORMAL LOW (ref 22–32)
Calcium: 10.8 mg/dL — ABNORMAL HIGH (ref 8.9–10.3)
Chloride: 106 mmol/L (ref 98–111)
Creatinine, Ser: 1.59 mg/dL — ABNORMAL HIGH (ref 0.44–1.00)
GFR, Estimated: 36 mL/min — ABNORMAL LOW (ref >60)
Glucose, Bld: 165 mg/dL — ABNORMAL HIGH (ref 70–99)
Potassium: 4.2 mmol/L (ref 3.5–5.1)
Sodium: 141 mmol/L (ref 135–145)
Total Bilirubin: 1.5 mg/dL — ABNORMAL HIGH (ref 0.0–1.2)
Total Protein: 7.7 g/dL (ref 6.5–8.1)

## 2024-04-15 MED ORDER — ONDANSETRON HCL 4 MG/2ML IJ SOLN
4.0000 mg | Freq: Once | INTRAMUSCULAR | Status: AC
  Administered 2024-04-15: 4 mg via INTRAVENOUS
  Filled 2024-04-15: qty 2

## 2024-04-15 MED ORDER — CIPROFLOXACIN IN D5W 400 MG/200ML IV SOLN
500.0000 mg | Freq: Once | INTRAVENOUS | Status: DC
  Filled 2024-04-15: qty 400

## 2024-04-15 MED ORDER — LACTATED RINGERS IV BOLUS
1000.0000 mL | Freq: Once | INTRAVENOUS | Status: AC
  Administered 2024-04-15: 1000 mL via INTRAVENOUS

## 2024-04-15 MED ORDER — SODIUM CHLORIDE 0.9 % IV BOLUS
1000.0000 mL | Freq: Once | INTRAVENOUS | Status: AC
  Administered 2024-04-15: 1000 mL via INTRAVENOUS

## 2024-04-15 MED ORDER — ONDANSETRON 4 MG PO TBDP: 4.0000 mg | ORAL_TABLET | Freq: Three times a day (TID) | ORAL | 0 refills | Status: AC | PRN

## 2024-04-15 MED ORDER — IOHEXOL 350 MG/ML SOLN
75.0000 mL | Freq: Once | INTRAVENOUS | Status: AC | PRN
  Administered 2024-04-15: 75 mL via INTRAVENOUS

## 2024-04-15 MED ORDER — CIPROFLOXACIN HCL 500 MG PO TABS: 500.0000 mg | ORAL_TABLET | Freq: Two times a day (BID) | ORAL | 0 refills | Status: DC

## 2024-04-15 MED ORDER — LACTATED RINGERS IV BOLUS: 1000.0000 mL | Freq: Once | INTRAVENOUS | Status: DC

## 2024-04-15 MED ORDER — CIPROFLOXACIN IN D5W 400 MG/200ML IV SOLN
400.0000 mg | Freq: Once | INTRAVENOUS | Status: AC
  Administered 2024-04-15: 400 mg via INTRAVENOUS

## 2024-04-15 NOTE — Discharge Instructions (Addendum)
 It was a pleasure taking care of you today. You were seen in the Emergency Department for nausea and vomiting. Your work-up was reassuring. Your CT/Xray/Labs showed pyelonephritis and urinary tract infection.  As discussed, we will treat you with Cipro  for the next 5 days.  I am also sending you home with a prescription for Zofran  if your nausea continues. Refer to the attached documentation for further management of your symptoms. Follow up with your PCP if your symptoms continue.  Please return to the ER if you experience chest pain, trouble breathing, intractable nausea/vomiting or any other life threatening illnesses.

## 2024-04-15 NOTE — ED Provider Notes (Signed)
 [facility] Provider Note   CSN: [id number] Arrival date & time: 04/15/24  1048     Patient presents with: Abdominal Pain   Chloe Johnson is a 68 y.o. female with past medical history of hypertension, CVA, NSTEMI, HFrEF, hyperlipidemia, prior stroke who presents emergency department for evaluation of nausea and vomiting with abdominal pain for the last 5 days.  Patient reports she started a GLP in July 2025 and has not yet experienced any side effects.  Her last injection was approximately 9 days ago, and patient thinks this may be a bowel blockage.  Patient does have a remote history of a prior stroke, so her speech is mildly delayed.  She is not significantly tender in her abdomen, but states it does hurt all over.  She reports she is unable to keep down food or water.  She states initially her emesis was green, although today she did state it looked like coffee grounds.  Patient states she had a small bowel movement most recently 3 days ago.   Abdominal Pain Associated symptoms: nausea        Prior to Admission medications   Medication Sig Start Date End Date Taking? Authorizing Provider  ciprofloxacin  (CIPRO ) 500 MG tablet Take 1 tablet (500 mg total) by mouth 2 (two) times daily. 04/15/24  Yes Willie Plain, Marry RAMAN, PA-C  ondansetron  (ZOFRAN -ODT) 4 MG disintegrating tablet Take 1 tablet (4 mg total) by mouth every 8 (eight) hours as needed for nausea or vomiting. 04/15/24  Yes Edwin Baines, Marry RAMAN, PA-C  acetaminophen  (TYLENOL ) 325 MG tablet Take 2 tablets (650 mg total) by mouth every 4 (four) hours as needed for mild pain (or temp > 37.5 C (99.5 F)). 02/04/22   Remi Pippin, NP  B Complex-C-Folic Acid  TABS Take 2 tablets by mouth daily.    [provider]  carvedilol  (COREG ) 3.125 MG tablet Take 1 tablet (3.125 mg total) by mouth 2 (two) times daily. 02/24/24   Lelon Hamilton T, PA-C  cyclobenzaprine  (FLEXERIL ) 10 MG tablet TAKE  ONE TABLET BY MOUTH AT BEDTIME 03/25/24   Raulkar, Sven SQUIBB, MD  ELIQUIS  5 MG TABS tablet Take 1 tablet (5 mg total) by mouth 2 (two) times daily. 11/04/23   Nahser, Aleene PARAS, MD  famotidine  (PEPCID ) 20 MG tablet Take 1 tablet (20 mg total) by mouth 2 (two) times daily. 02/24/24   Lelon Hamilton T, PA-C  Ginkgo Biloba 120 MG CAPS Take 2 capsules by mouth daily.    [provider]  loratadine  (CLARITIN ) 10 MG tablet Take 1 tablet (10 mg total) by mouth daily. 02/05/22   Remi Pippin, NP  Magnesium  300 MG CAPS PT TAKES GUMMIES MAG BID    [provider]  mirabegron  ER (MYRBETRIQ ) 50 MG TB24 tablet Take 50 mg by mouth daily. 12/31/21   [provider]  Naltrexone  HCl, Pain, 1.5 MG CAPS Take 1.5 mg by mouth daily at 6 (six) AM.    [provider]  potassium chloride  (KLOR-CON ) 10 MEQ tablet Take 1 tablet (10 mEq total) by mouth 2 (two) times daily. 12/22/23   Lelon Hamilton T, PA-C  pregabalin  (LYRICA ) 100 MG capsule Take 1 capsule (100 mg total) by mouth daily. 09/09/23   Raulkar, Sven SQUIBB, MD  pregabalin  (LYRICA ) 75 MG capsule Take 1 capsule (75 mg total) by mouth daily. 01/07/24   Raulkar, Sven SQUIBB, MD  rosuvastatin  (CRESTOR ) 20 MG tablet Take 1 tablet (20 mg total) by mouth  daily. 03/31/24   Lonni Slain, MD  Semaglutide -Weight Management (WEGOVY  [place]) Inject into the skin.    [provider]  spironolactone  (ALDACTONE ) 25 MG tablet Take 1/2 tablet (12.5 mg total) by mouth daily. 09/16/23   Nahser, Aleene PARAS, MD  topiramate  (TOPAMAX ) 25 MG tablet Take 1 tablet (25 mg total) by mouth daily as needed. 07/08/23   Raulkar, Sven SQUIBB, MD  topiramate  (TOPAMAX ) 50 MG tablet Take 1 tablet (50 mg total) by mouth 2 (two) times daily. 12/07/23   Raulkar, Sven SQUIBB, MD  UNABLE TO FIND Med Name: HAPPY HER SUPPLEMENT   SUPPORTS INFLAMMATION AND MOOD PER THE PT    [provider]  valsartan  (DIOVAN ) 80 MG tablet Take 1 tablet (80 mg total) by mouth daily. 12/29/23    Lelon Hamilton T, PA-C    Allergies: Doxycycline , Erythromycin, Other, Vancomycin , Vicodin [hydrocodone-acetaminophen ], Dilaudid  [hydromorphone  hcl], Keflex [cephalexin], Penicillins, and Sulfa antibiotics    Review of Systems  Gastrointestinal:  Positive for abdominal pain and nausea.    Updated Vital Signs BP (!) 128/90   Pulse 97   Temp (!) 97.4 F (36.3 C)   Resp 14   Ht 5' (1.524 m)   Wt 52.1 kg   SpO2 100%   BMI 22.43 kg/m   Physical Exam Vitals and nursing note reviewed.  Constitutional:      Appearance: Normal appearance. She is ill-appearing.  HENT:     Head: Normocephalic and atraumatic.     Mouth/Throat:     Mouth: Mucous membranes are moist.  Eyes:     General: No scleral icterus.       Right eye: No discharge.        Left eye: No discharge.     Conjunctiva/sclera: Conjunctivae normal.  Cardiovascular:     Rate and Rhythm: Normal rate and regular rhythm.     Pulses: Normal pulses.  Pulmonary:     Effort: Pulmonary effort is normal.     Breath sounds: Normal breath sounds.  Abdominal:     General: There is no distension.     Tenderness: There is no abdominal tenderness.     Comments: Abdomen is relatively nontender on exam.  Musculoskeletal:        General: No deformity.     Cervical back: Normal range of motion.  Skin:    General: Skin is warm and dry.     Capillary Refill: Capillary refill takes less than 2 seconds.  Neurological:     Mental Status: She is alert.     Motor: No weakness.  Psychiatric:        Mood and Affect: Mood normal.     (all labs ordered are listed, but only abnormal results are displayed) Labs Reviewed  LIPASE, BLOOD - Abnormal; Notable for the following components:      Result Value   Lipase 71 (*)    All other components within normal limits  COMPREHENSIVE METABOLIC PANEL WITH GFR - Abnormal; Notable for the following components:   CO2 14 (*)    Glucose, Bld 165 (*)    Creatinine, Ser 1.59 (*)    Calcium  10.8  (*)    Total Bilirubin 1.5 (*)    GFR, Estimated 36 (*)    Anion gap 21 (*)    All other components within normal limits  CBC - Abnormal; Notable for the following components:   WBC 15.6 (*)    RBC 5.23 (*)    Hemoglobin 15.1 (*)  All other components within normal limits  URINALYSIS, ROUTINE W REFLEX MICROSCOPIC - Abnormal; Notable for the following components:   APPearance CLOUDY (*)    Hgb urine dipstick SMALL (*)    Ketones, ur 80 (*)    Protein, ur 100 (*)    Leukocytes,Ua LARGE (*)    All other components within normal limits    EKG: None  Radiology: CT ABDOMEN PELVIS W CONTRAST Result Date: 04/15/2024 CLINICAL DATA:  Bowel obstruction EXAM: CT ABDOMEN AND PELVIS WITH CONTRAST TECHNIQUE: Multidetector CT imaging of the abdomen and pelvis was performed using the standard protocol following bolus administration of intravenous contrast. RADIATION DOSE REDUCTION: This exam was performed according to the departmental dose-optimization program which includes automated exposure control, adjustment of the mA and/or kV according to patient size and/or use of iterative reconstruction technique. CONTRAST:  75mL OMNIPAQUE  IOHEXOL  350 MG/ML SOLN COMPARISON:  01/28/2022, 08/14/2023 FINDINGS: Lower chest: No acute pleural or parenchymal lung disease. Hepatobiliary: No focal liver abnormality is seen. No gallstones, gallbladder wall thickening, or biliary dilatation. Pancreas: Unremarkable. No pancreatic ductal dilatation or surrounding inflammatory changes. Spleen: Normal in size without focal abnormality. Adrenals/Urinary Tract: There are subtle areas of decreased renal cortical enhancement, most pronounced within the left kidney on the delayed series, suspicious for pyelonephritis. No evidence of renal abscess. No urinary tract calculi or obstructive uropathy. The adrenals and bladder are unremarkable. Stomach/Bowel: No bowel obstruction or ileus. Normal appendix right lower quadrant. No bowel  wall thickening or inflammatory change. Vascular/Lymphatic: Aortic atherosclerosis. No enlarged abdominal or pelvic lymph nodes. Reproductive: Stable appearance of uterus and adnexal structures. Other: No free fluid or free intraperitoneal gas. No abdominal wall hernia. Musculoskeletal: No acute or destructive bony abnormalities. Postsurgical changes from L2 through L5 again noted. Reconstructed images demonstrate no additional findings. IMPRESSION: 1. Subtle areas of decreased renal cortical enhancement, left greater than right, concerning for pyelonephritis. Please correlate with urinalysis. 2. No evidence of bowel obstruction or ileus.  Normal appendix. 3.  Aortic Atherosclerosis (ICD10-I70.0). Electronically Signed   By: Ozell Daring M.D.   On: 04/15/2024 15:08     Procedures   Medications Ordered in the ED  ciprofloxacin  (CIPRO ) IVPB 500 mg (has no administration in time range)  lactated ringers  bolus 1,000 mL (has no administration in time range)  lactated ringers  bolus 1,000 mL (0 mLs Intravenous Stopped 04/15/24 1510)  ondansetron  (ZOFRAN ) injection 4 mg (4 mg Intravenous Given 04/15/24 1215)  iohexol  (OMNIPAQUE ) 350 MG/ML injection 75 mL (75 mLs Intravenous Contrast Given 04/15/24 1453)                                   Medical Decision Making Amount and/or Complexity of Data Reviewed Labs: ordered. Radiology: ordered.  Risk Prescription drug management.   This patient presents to the ED for concern of abdominal pain and vomiting, this involves an extensive number of treatment options, and is a complaint that carries with it a high risk of complications and morbidity.   Differential diagnosis includes: Mesenteric ischemia, SBO, cholecystitis, pancreatitis, appendicitis, gastroenteritis  Co morbidities:  hypertension, prior CVA, HFrEF, NSTEMI  Lab Tests:  I Ordered, and personally interpreted labs.  The pertinent results include:    - WBC: 15.6 - Urine with large  leukocytes  Imaging Studies:  I ordered imaging studies including CT abdomen pelvis I independently visualized and interpreted imaging.    CT abdomen pelvis: 1. Subtle areas of  decreased renal cortical enhancement, left  greater than right, concerning for pyelonephritis. Please correlate  with urinalysis.  2. No evidence of bowel obstruction or ileus.  Normal appendix.  3.  Aortic Atherosclerosis (ICD10-I70.0).     Cardiac Monitoring/ECG:  The patient was maintained on a cardiac monitor.  I personally viewed and interpreted the cardiac monitored which showed an underlying rhythm of: Sinus rhythm   Medicines ordered and prescription drug management:  I ordered medication including  Medications  ciprofloxacin  (CIPRO ) IVPB 500 mg (has no administration in time range)  lactated ringers  bolus 1,000 mL (has no administration in time range)  lactated ringers  bolus 1,000 mL (0 mLs Intravenous Stopped 04/15/24 1510)  ondansetron  (ZOFRAN ) injection 4 mg (4 mg Intravenous Given 04/15/24 1215)  iohexol  (OMNIPAQUE ) 350 MG/ML injection 75 mL (75 mLs Intravenous Contrast Given 04/15/24 1453)   for dehydration Reevaluation of the patient after these medicines showed that the patient improved I have reviewed the patients home medicines and have made adjustments as needed  Test Considered:   none  Critical Interventions:   none  Consultations Obtained: None  Problem List / ED Course:     ICD-10-CM   1. Pyelonephritis  N12       MDM: 68 year old female who presents emergency department for evaluation of abdominal pain and vomiting.  Patient reports she has been vomiting for the last 5 days and is unable to keep down fluids or solids.  She has been taking a GLP-1 since July, most recent dose was 9 days ago.  Patient reports multiple episodes of emesis which have been green.  However, today she did endorse coffee grounds.  I ordered IV fluids for likely dehydration and Zofran  for  nausea.  Her labs show leukocytosis of 15.6 and lipase of 71.  Her urine also shows large leukocytes and greater than 50 WBCs.  Her CT abdomen pelvis has been completed and shows subtle areas of decreased renal cortical enhancement left greater than right, which is concerning for pyelonephritis.  No evidence of bowel obstruction.  Will give patient a dose of Cipro  while she is in the emergency department to treat ascending UTI and likely pyelonephritis.  First-line for pyelonephritis is ceftriaxone  IV and Keflex outpatient, however patient was adamant that she is allergic.  She stated she has taken Cipro  in the past and had success with that medication.  I will also send the patient home with a prescription for Zofran  for her continued nausea.  Patient has been educated on the importance of taking all of her antibiotics as prescribed.  Patient verbalized understanding to this.  Patient's vital signs are stable.  Patient is appropriate for discharge once her antibiotics are finished in the emergency department.  Dispostion:  After consideration of the diagnostic results and the patients response to treatment, I feel that the patient would benefit from outpatient antibiotic therapy and PCP follow-up.   Final diagnoses:  Pyelonephritis    ED Discharge Orders          Ordered    ciprofloxacin  (CIPRO ) 500 MG tablet  2 times daily        04/15/24 1526    ondansetron  (ZOFRAN -ODT) 4 MG disintegrating tablet  Every 8 hours PRN        04/15/24 1526               Karsen Fellows, Marry GORMAN RIGGERS 04/15/24 1527    Cleotilde Rogue, MD 04/16/24 1112

## 2024-04-15 NOTE — ED Notes (Signed)
 Pt to CT

## 2024-04-15 NOTE — ED Triage Notes (Signed)
 Reports n/v and abd pain for several days.  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

## 2024-04-18 ENCOUNTER — Encounter (HOSPITAL_COMMUNITY): Payer: Self-pay | Admitting: Internal Medicine

## 2024-04-18 ENCOUNTER — Inpatient Hospital Stay (HOSPITAL_BASED_OUTPATIENT_CLINIC_OR_DEPARTMENT_OTHER)
Admission: EM | Admit: 2024-04-18 | Discharge: 2024-04-21 | DRG: 690 | Disposition: A | Discharge status: Home / Self Care | Attending: Family Medicine | Admitting: Family Medicine

## 2024-04-18 ENCOUNTER — Emergency Department (HOSPITAL_BASED_OUTPATIENT_CLINIC_OR_DEPARTMENT_OTHER)

## 2024-04-18 ENCOUNTER — Other Ambulatory Visit: Payer: Self-pay

## 2024-04-18 DIAGNOSIS — I252 Old myocardial infarction: Secondary | ICD-10-CM

## 2024-04-18 DIAGNOSIS — R471 Dysarthria and anarthria: Secondary | ICD-10-CM | POA: Diagnosis present

## 2024-04-18 DIAGNOSIS — Z882 Allergy status to sulfonamides status: Secondary | ICD-10-CM

## 2024-04-18 DIAGNOSIS — R946 Abnormal results of thyroid function studies: Secondary | ICD-10-CM | POA: Diagnosis present

## 2024-04-18 DIAGNOSIS — Z8249 Family history of ischemic heart disease and other diseases of the circulatory system: Secondary | ICD-10-CM

## 2024-04-18 DIAGNOSIS — R0789 Other chest pain: Secondary | ICD-10-CM | POA: Diagnosis present

## 2024-04-18 DIAGNOSIS — Z7901 Long term (current) use of anticoagulants: Secondary | ICD-10-CM

## 2024-04-18 DIAGNOSIS — I10 Essential (primary) hypertension: Secondary | ICD-10-CM | POA: Diagnosis present

## 2024-04-18 DIAGNOSIS — E876 Hypokalemia: Secondary | ICD-10-CM | POA: Diagnosis present

## 2024-04-18 DIAGNOSIS — R7989 Other specified abnormal findings of blood chemistry: Secondary | ICD-10-CM | POA: Diagnosis present

## 2024-04-18 DIAGNOSIS — I2510 Atherosclerotic heart disease of native coronary artery without angina pectoris: Secondary | ICD-10-CM | POA: Diagnosis present

## 2024-04-18 DIAGNOSIS — I513 Intracardiac thrombosis, not elsewhere classified: Secondary | ICD-10-CM | POA: Diagnosis present

## 2024-04-18 DIAGNOSIS — R4701 Aphasia: Secondary | ICD-10-CM | POA: Diagnosis present

## 2024-04-18 DIAGNOSIS — Z1152 Encounter for screening for COVID-19: Secondary | ICD-10-CM

## 2024-04-18 DIAGNOSIS — I959 Hypotension, unspecified: Principal | ICD-10-CM | POA: Diagnosis present

## 2024-04-18 DIAGNOSIS — D689 Coagulation defect, unspecified: Secondary | ICD-10-CM | POA: Diagnosis present

## 2024-04-18 DIAGNOSIS — Z880 Allergy status to penicillin: Secondary | ICD-10-CM

## 2024-04-18 DIAGNOSIS — I2489 Other forms of acute ischemic heart disease: Secondary | ICD-10-CM | POA: Diagnosis present

## 2024-04-18 DIAGNOSIS — Z87442 Personal history of urinary calculi: Secondary | ICD-10-CM

## 2024-04-18 DIAGNOSIS — Z881 Allergy status to other antibiotic agents status: Secondary | ICD-10-CM

## 2024-04-18 DIAGNOSIS — N179 Acute kidney failure, unspecified: Secondary | ICD-10-CM | POA: Diagnosis present

## 2024-04-18 DIAGNOSIS — E785 Hyperlipidemia, unspecified: Secondary | ICD-10-CM | POA: Diagnosis present

## 2024-04-18 DIAGNOSIS — F419 Anxiety disorder, unspecified: Secondary | ICD-10-CM | POA: Diagnosis present

## 2024-04-18 DIAGNOSIS — Z888 Allergy status to other drugs, medicaments and biological substances status: Secondary | ICD-10-CM

## 2024-04-18 DIAGNOSIS — N12 Tubulo-interstitial nephritis, not specified as acute or chronic: Principal | ICD-10-CM | POA: Diagnosis present

## 2024-04-18 DIAGNOSIS — Z79899 Other long term (current) drug therapy: Secondary | ICD-10-CM

## 2024-04-18 DIAGNOSIS — K219 Gastro-esophageal reflux disease without esophagitis: Secondary | ICD-10-CM | POA: Diagnosis present

## 2024-04-18 DIAGNOSIS — Z885 Allergy status to narcotic agent status: Secondary | ICD-10-CM

## 2024-04-18 DIAGNOSIS — E860 Dehydration: Secondary | ICD-10-CM | POA: Diagnosis present

## 2024-04-18 DIAGNOSIS — I5181 Takotsubo syndrome: Secondary | ICD-10-CM | POA: Diagnosis present

## 2024-04-18 DIAGNOSIS — Z8673 Personal history of transient ischemic attack (TIA), and cerebral infarction without residual deficits: Secondary | ICD-10-CM

## 2024-04-18 DIAGNOSIS — D649 Anemia, unspecified: Secondary | ICD-10-CM | POA: Diagnosis present

## 2024-04-18 LAB — URINALYSIS, W/ REFLEX TO CULTURE (INFECTION SUSPECTED)
Bilirubin Urine: NEGATIVE
Glucose, UA: NEGATIVE mg/dL
Hgb urine dipstick: NEGATIVE
Nitrite: NEGATIVE
Protein, ur: NEGATIVE mg/dL
Specific Gravity, Urine: 1.008 (ref 1.005–1.030)
pH: 6 (ref 5.0–8.0)

## 2024-04-18 LAB — RESP PANEL BY RT-PCR (RSV, FLU A&B, COVID)  RVPGX2
Influenza A by PCR: NEGATIVE
Influenza B by PCR: NEGATIVE
Resp Syncytial Virus by PCR: NEGATIVE
SARS Coronavirus 2 by RT PCR: NEGATIVE

## 2024-04-18 LAB — CBC WITH DIFFERENTIAL/PLATELET
Abs Immature Granulocytes: 0.02 K/uL (ref 0.00–0.07)
Basophils Absolute: 0 K/uL (ref 0.0–0.1)
Basophils Relative: 0 %
Eosinophils Absolute: 0 K/uL (ref 0.0–0.5)
Eosinophils Relative: 1 %
HCT: 39.4 % (ref 36.0–46.0)
Hemoglobin: 13.3 g/dL (ref 12.0–15.0)
Immature Granulocytes: 0 %
Lymphocytes Relative: 20 %
Lymphs Abs: 1.6 K/uL (ref 0.7–4.0)
MCH: 28.4 pg (ref 26.0–34.0)
MCHC: 33.8 g/dL (ref 30.0–36.0)
MCV: 84 fL (ref 80.0–100.0)
Monocytes Absolute: 0.5 K/uL (ref 0.1–1.0)
Monocytes Relative: 6 %
Neutro Abs: 5.8 K/uL (ref 1.7–7.7)
Neutrophils Relative %: 73 %
Platelets: 233 K/uL (ref 150–400)
RBC: 4.69 MIL/uL (ref 3.87–5.11)
RDW: 13.2 % (ref 11.5–15.5)
WBC: 8 K/uL (ref 4.0–10.5)
nRBC: 0 % (ref 0.0–0.2)

## 2024-04-18 LAB — TROPONIN T, HIGH SENSITIVITY
Troponin T High Sensitivity: 91 ng/L — ABNORMAL HIGH (ref 0–19)
Troponin T High Sensitivity: 72 ng/L — ABNORMAL HIGH (ref 0–19)

## 2024-04-18 LAB — COMPREHENSIVE METABOLIC PANEL WITH GFR
ALT: 15 U/L (ref 0–44)
AST: 29 U/L (ref 15–41)
Albumin: 3.8 g/dL (ref 3.5–5.0)
Alkaline Phosphatase: 52 U/L (ref 38–126)
Anion gap: 14 (ref 5–15)
BUN: 25 mg/dL — ABNORMAL HIGH (ref 8–23)
CO2: 23 mmol/L (ref 22–32)
Calcium: 11.3 mg/dL — ABNORMAL HIGH (ref 8.9–10.3)
Chloride: 97 mmol/L — ABNORMAL LOW (ref 98–111)
Creatinine, Ser: 1.08 mg/dL — ABNORMAL HIGH (ref 0.44–1.00)
GFR, Estimated: 56 mL/min — ABNORMAL LOW (ref >60)
Glucose, Bld: 99 mg/dL (ref 70–99)
Potassium: 3.6 mmol/L (ref 3.5–5.1)
Sodium: 134 mmol/L — ABNORMAL LOW (ref 135–145)
Total Bilirubin: 0.3 mg/dL (ref 0.0–1.2)
Total Protein: 6.4 g/dL — ABNORMAL LOW (ref 6.5–8.1)

## 2024-04-18 LAB — LACTIC ACID, PLASMA: Lactic Acid, Venous: 1.4 mmol/L (ref 0.5–1.9)

## 2024-04-18 LAB — PROTIME-INR
INR: 1.5 — ABNORMAL HIGH (ref 0.8–1.2)
Prothrombin Time: 18.4 s — ABNORMAL HIGH (ref 11.4–15.2)

## 2024-04-18 MED ORDER — SODIUM CHLORIDE 0.9 % IV SOLN: 1.0000 g | INTRAVENOUS | Status: DC

## 2024-04-18 MED ORDER — LACTATED RINGERS IV BOLUS (SEPSIS)
250.0000 mL | Freq: Once | INTRAVENOUS | Status: AC
Start: 2024-04-18 — End: 2024-04-18
  Administered 2024-04-18: 250 mL via INTRAVENOUS

## 2024-04-18 MED ORDER — LACTATED RINGERS IV BOLUS (SEPSIS)
1000.0000 mL | Freq: Once | INTRAVENOUS | Status: AC
Start: 2024-04-18 — End: 2024-04-18
  Administered 2024-04-18: 1000 mL via INTRAVENOUS

## 2024-04-18 MED ORDER — LACTATED RINGERS IV SOLN: INTRAVENOUS | Status: DC

## 2024-04-18 MED ORDER — LACTATED RINGERS IV BOLUS (SEPSIS)
500.0000 mL | Freq: Once | INTRAVENOUS | Status: AC
Start: 2024-04-18 — End: 2024-04-18
  Administered 2024-04-18: 500 mL via INTRAVENOUS

## 2024-04-18 MED ORDER — SODIUM CHLORIDE 0.9 % IV SOLN
2.0000 g | Freq: Once | INTRAVENOUS | Status: AC
  Administered 2024-04-18: 2 g via INTRAVENOUS
  Filled 2024-04-18: qty 20

## 2024-04-18 NOTE — ED Provider Notes (Signed)
 [place] EMERGENCY DEPARTMENT AT [facility] Provider Note   CSN: [id number] Arrival date & time: 04/18/24  1624     Patient presents with: No chief complaint on file.   Chloe Johnson is a 68 y.o. female.   HPI   Patient has a history of hypertension kidney stones acid reflux back pain osteomyelitis, discitis, stroke, myocardial infarction.  Patient presents ED with complaints of weakness and low blood pressure.  Patient was seen in the emergency room on November 21.  Patient had a workup that included laboratory testings and CT scan.  Findings suggestive of possible pyelonephritis.  Patient was discharged on a course of Cipro .  Patient states since then she feels like she has been having a reaction to the Cipro .  She has been having some chest discomfort.  She also has been having some acid reflux type symptom.  Patient states she has not been eating or drinking well.  This morning she took her blood pressure medications.  She noticed that her blood pressure was very low.  She denies any abdominal pain.  She has not measured any fevers at home.  She denies any difficulty breathing.  Patient stated she just think she needs some IV fluids  Prior to Admission medications   Medication Sig Start Date End Date Taking? Authorizing Provider  acetaminophen  (TYLENOL ) 325 MG tablet Take 2 tablets (650 mg total) by mouth every 4 (four) hours as needed for mild pain (or temp > 37.5 C (99.5 F)). 02/04/22   Remi Pippin, NP  B Complex-C-Folic Acid  TABS Take 2 tablets by mouth daily.    [provider]  carvedilol  (COREG ) 3.125 MG tablet Take 1 tablet (3.125 mg total) by mouth 2 (two) times daily. 02/24/24   Lelon Hamilton T, PA-C  ciprofloxacin  (CIPRO ) 500 MG tablet Take 1 tablet (500 mg total) by mouth 2 (two) times daily. 04/15/24   Dufour, Marry RAMAN, PA-C  cyclobenzaprine  (FLEXERIL ) 10 MG tablet TAKE ONE TABLET BY MOUTH AT BEDTIME 03/25/24   Raulkar, Sven SQUIBB, MD  ELIQUIS  5 MG TABS  tablet Take 1 tablet (5 mg total) by mouth 2 (two) times daily. 11/04/23   Nahser, Aleene PARAS, MD  famotidine  (PEPCID ) 20 MG tablet Take 1 tablet (20 mg total) by mouth 2 (two) times daily. 02/24/24   Lelon Hamilton T, PA-C  Ginkgo Biloba 120 MG CAPS Take 2 capsules by mouth daily.    [provider]  loratadine  (CLARITIN ) 10 MG tablet Take 1 tablet (10 mg total) by mouth daily. 02/05/22   Remi Pippin, NP  Magnesium  300 MG CAPS PT TAKES GUMMIES MAG BID    [provider]  mirabegron  ER (MYRBETRIQ ) 50 MG TB24 tablet Take 50 mg by mouth daily. 12/31/21   [provider]  Naltrexone  HCl, Pain, 1.5 MG CAPS Take 1.5 mg by mouth daily at 6 (six) AM.    [provider]  ondansetron  (ZOFRAN -ODT) 4 MG disintegrating tablet Take 1 tablet (4 mg total) by mouth every 8 (eight) hours as needed for nausea or vomiting. 04/15/24   Dufour, Gabrielle S, PA-C  potassium chloride  (KLOR-CON ) 10 MEQ tablet Take 1 tablet (10 mEq total) by mouth 2 (two) times daily. 12/22/23   Lelon Hamilton T, PA-C  pregabalin  (LYRICA ) 100 MG capsule Take 1 capsule (100 mg total) by mouth daily. 09/09/23   Raulkar, Sven SQUIBB, MD  pregabalin  (LYRICA ) 75 MG capsule Take 1 capsule (75 mg total) by mouth daily. 01/07/24   Raulkar, Krutika  P, MD  rosuvastatin  (CRESTOR ) 20 MG tablet Take 1 tablet (20 mg total) by mouth daily. 03/31/24   Lonni Slain, MD  Semaglutide -Weight Management (WEGOVY  [place]) Inject into the skin.    [provider]  spironolactone  (ALDACTONE ) 25 MG tablet Take 1/2 tablet (12.5 mg total) by mouth daily. 09/16/23   Nahser, Aleene PARAS, MD  topiramate  (TOPAMAX ) 25 MG tablet Take 1 tablet (25 mg total) by mouth daily as needed. 07/08/23   Raulkar, Sven SQUIBB, MD  topiramate  (TOPAMAX ) 50 MG tablet Take 1 tablet (50 mg total) by mouth 2 (two) times daily. 12/07/23   Raulkar, Sven SQUIBB, MD  UNABLE TO FIND Med Name: HAPPY HER SUPPLEMENT   SUPPORTS INFLAMMATION AND MOOD PER THE PT    [provider]  valsartan  (DIOVAN ) 80 MG tablet Take 1 tablet (80 mg total) by mouth daily. 12/29/23   Lelon Hamilton T, PA-C    Allergies: Doxycycline , Erythromycin, Other, Vancomycin , Vicodin [hydrocodone-acetaminophen ], Dilaudid  [hydromorphone  hcl], Keflex [cephalexin], Penicillins, and Sulfa antibiotics    Review of Systems  Updated Vital Signs BP 101/65   Pulse 80   Temp 98.1 F (36.7 C) (Rectal) Comment: Could not get oral in triage-would not read.  Resp (!) 21   SpO2 100%   Physical Exam Vitals and nursing note reviewed.  Constitutional:      Appearance: She is well-developed. She is not diaphoretic.  HENT:     Head: Normocephalic and atraumatic.     Right Ear: External ear normal.     Left Ear: External ear normal.  Eyes:     General: No scleral icterus.       Right eye: No discharge.        Left eye: No discharge.     Conjunctiva/sclera: Conjunctivae normal.  Neck:     Trachea: No tracheal deviation.  Cardiovascular:     Rate and Rhythm: Normal rate and regular rhythm.  Pulmonary:     Effort: Pulmonary effort is normal. No respiratory distress.     Breath sounds: Normal breath sounds. No stridor. No wheezing or rales.  Abdominal:     General: Bowel sounds are normal. There is no distension.     Palpations: Abdomen is soft.     Tenderness: There is no abdominal tenderness. There is no guarding or rebound.  Musculoskeletal:        General: No tenderness or deformity.     Cervical back: Neck supple.  Skin:    General: Skin is warm and dry.     Findings: No rash.  Neurological:     General: No focal deficit present.     Mental Status: She is alert.     Cranial Nerves: No cranial nerve deficit, dysarthria or facial asymmetry.     Sensory: No sensory deficit.     Motor: No abnormal muscle tone or seizure activity.     Coordination: Coordination normal.  Psychiatric:        Mood and Affect: Mood normal.     (all labs ordered are listed, but only abnormal  results are displayed) Labs Reviewed  COMPREHENSIVE METABOLIC PANEL WITH GFR - Abnormal; Notable for the following components:      Result Value   Sodium 134 (*)    Chloride 97 (*)    BUN 25 (*)    Creatinine, Ser 1.08 (*)    Calcium  11.3 (*)    Total Protein 6.4 (*)    GFR, Estimated 56 (*)    All other  components within normal limits  URINALYSIS, W/ REFLEX TO CULTURE (INFECTION SUSPECTED) - Abnormal; Notable for the following components:   Ketones, ur TRACE (*)    Leukocytes,Ua MODERATE (*)    Bacteria, UA RARE (*)    All other components within normal limits  PROTIME-INR - Abnormal; Notable for the following components:   Prothrombin Time 18.4 (*)    INR 1.5 (*)    All other components within normal limits  TROPONIN T, HIGH SENSITIVITY - Abnormal; Notable for the following components:   Troponin T High Sensitivity 91 (*)    All other components within normal limits  RESP PANEL BY RT-PCR (RSV, FLU A&B, COVID)  RVPGX2  CULTURE, BLOOD (ROUTINE X 2)  CULTURE, BLOOD (ROUTINE X 2)  LACTIC ACID, PLASMA  CBC WITH DIFFERENTIAL/PLATELET  LACTIC ACID, PLASMA  TROPONIN T, HIGH SENSITIVITY    EKG: EKG Interpretation Date/Time:  Monday April 18 2024 16:55:07 EST Ventricular Rate:  66 PR Interval:  82 QRS Duration:  80 QT Interval:  491 QTC Calculation: 515 R Axis:   76  Text Interpretation: Sinus rhythm Short PR interval Abnormal T, consider ischemia, lateral leads Prolonged QT interval No significant change since last tracing Confirmed by Randol Simmonds [phone]) on 04/18/2024 5:12:27 PM  Radiology: [facility] Chest Port 1 View Result Date: 04/18/2024 EXAM: 1 VIEW(S) XRAY OF THE CHEST 04/18/2024 05:24:30 PM COMPARISON: 01/28/2022 CLINICAL HISTORY: Questionable sepsis - evaluate for abnormality. FINDINGS: LUNGS AND PLEURA: Shallow inspiration. Lungs are clear. No pleural effusion. No pneumothorax. HEART AND MEDIASTINUM: Heart size and pulmonary vascularity are normal. Mediastinal contours  appear intact. Calcification of the aorta. BONES AND SOFT TISSUES: No acute osseous abnormality. Degenerative changes in the spine and shoulders. Postoperative changes seen in the lumbar spine. IMPRESSION: 1. No acute cardiopulmonary process identified. Electronically signed by: Elsie Gravely MD 04/18/2024 06:22 PM EST RP Workstation: [id number]     .Critical Care  Performed by: Randol Simmonds, MD Authorized by: Randol Simmonds, MD   Critical care provider statement:    Critical care time (minutes):  35    Medications Ordered in the ED  lactated ringers  infusion ( Intravenous Rate/Dose Change 04/18/24 1848)  lactated ringers  bolus 1,000 mL (0 mLs Intravenous Stopped 04/18/24 1829)    And  lactated ringers  bolus 500 mL (0 mLs Intravenous Stopped 04/18/24 1847)    And  lactated ringers  bolus 250 mL (0 mLs Intravenous Stopped 04/18/24 1847)  cefTRIAXone  (ROCEPHIN ) 2 g in sodium chloride  0.9 % 100 mL IVPB (0 g Intravenous Stopped 04/18/24 1829)    Clinical Course as of 04/22/24 0841  Mon Apr 18, 2024  1831 BP improving [JK]  1907 Urinalysis, w/ Reflex to Culture (Infection Suspected) -Urine, Catheterized(!) nl [JK]  1907 Resp panel by RT-PCR (RSV, Flu A&B, Covid) Anterior Nasal Swab nl [JK]  1908 Comprehensive metabolic panel(!) Creatinine improved since last [JK]  1908 Troponin T, High Sensitivity(!) Trop elevated [JK]  1908 Lactic acid, plasma nl [JK]  1908 DG Chest Port 1 View nl [JK]  1909 CT abdomen pelvis 3 days ago suggested possible pyelonephritis [JK]  1945 CAse discussed with Dr Franky [JK]    Clinical Course User Index [JK] Randol Simmonds, MD                                 Medical Decision Making Frontal diagnosis includes but not limited to acute kidney injury, sepsis, myocardial infarction  Problems Addressed: Hypotension, unspecified  hypotension type: acute illness or injury that poses a threat to life or bodily functions Troponin level elevated: acute illness or  injury that poses a threat to life or bodily functions  Amount and/or Complexity of Data Reviewed Labs: ordered. Decision-making details documented in ED Course. Radiology: ordered and independent interpretation performed. Decision-making details documented in ED Course.  Risk Prescription drug management. Decision regarding hospitalization.   Patient presented to the emergency room for evaluation of low blood pressure.  Patient was recently seen in the emergency room and was diagnosed with pyelonephritis.  Patient reports a very low blood pressure today and fatigue.  She did take her blood pressure medications this morning.  Patient was hypotensive on arrival.  I was concerned about evolving sepsis with her recent UTI history.  Patient's lactic acid however is normal.  She does not have any significant leukocytosis.  Urinalysis does not show persistent signs of definite infection.  It does seem that she is responding to antibiotic treatment.  There may be a component of dehydration but her creatinine is actually improved compared to the other day.  Patient however does have elevated troponins.  She does have history of Takotsubo's with non-ST elevation MI.  Patient is currently not having any chest pain or shortness of breath but this will still need to be monitored closely.  Her hypotension has improved with IV fluid hydration but I think she warrants admission to hospital for further evaluation and monitoring.  She is currently at a freestanding ED and I will consult for transfer and admission.    Final diagnoses:  Hypotension, unspecified hypotension type  Troponin level elevated    ED Discharge Orders     None          Randol Simmonds, MD 04/22/24 [phone]

## 2024-04-18 NOTE — Sepsis Progress Note (Signed)
 Sepsis protocol monitored by eLink

## 2024-04-18 NOTE — ED Triage Notes (Signed)
 Seen Friday for N/V several days. Sent home diagnosed pyelo. Comes in today for low BP and fatigue. MAP 55. Difficulty eating. Took BP medication today as well. Speech slurred due to previous stroke.

## 2024-04-19 ENCOUNTER — Other Ambulatory Visit: Payer: Self-pay

## 2024-04-19 ENCOUNTER — Encounter (HOSPITAL_COMMUNITY): Payer: Self-pay | Admitting: Internal Medicine

## 2024-04-19 DIAGNOSIS — N10 Acute tubulo-interstitial nephritis: Secondary | ICD-10-CM | POA: Diagnosis not present

## 2024-04-19 DIAGNOSIS — I959 Hypotension, unspecified: Secondary | ICD-10-CM | POA: Diagnosis not present

## 2024-04-19 DIAGNOSIS — N179 Acute kidney failure, unspecified: Secondary | ICD-10-CM | POA: Insufficient documentation

## 2024-04-19 LAB — CBC WITH DIFFERENTIAL/PLATELET
Abs Immature Granulocytes: 0.01 K/uL (ref 0.00–0.07)
Basophils Absolute: 0 K/uL (ref 0.0–0.1)
Basophils Relative: 0 %
Eosinophils Absolute: 0.1 K/uL (ref 0.0–0.5)
Eosinophils Relative: 2 %
HCT: 30.3 % — ABNORMAL LOW (ref 36.0–46.0)
Hemoglobin: 10.2 g/dL — ABNORMAL LOW (ref 12.0–15.0)
Immature Granulocytes: 0 %
Lymphocytes Relative: 35 %
Lymphs Abs: 1.9 K/uL (ref 0.7–4.0)
MCH: 28.7 pg (ref 26.0–34.0)
MCHC: 33.7 g/dL (ref 30.0–36.0)
MCV: 85.1 fL (ref 80.0–100.0)
Monocytes Absolute: 0.5 K/uL (ref 0.1–1.0)
Monocytes Relative: 10 %
Neutro Abs: 2.9 K/uL (ref 1.7–7.7)
Neutrophils Relative %: 53 %
Platelets: 143 K/uL — ABNORMAL LOW (ref 150–400)
RBC: 3.56 MIL/uL — ABNORMAL LOW (ref 3.87–5.11)
RDW: 12.9 % (ref 11.5–15.5)
WBC: 5.4 K/uL (ref 4.0–10.5)
nRBC: 0 % (ref 0.0–0.2)

## 2024-04-19 LAB — COMPREHENSIVE METABOLIC PANEL WITH GFR
ALT: 12 U/L (ref 0–44)
AST: 25 U/L (ref 15–41)
Albumin: 2.3 g/dL — ABNORMAL LOW (ref 3.5–5.0)
Alkaline Phosphatase: 27 U/L — ABNORMAL LOW (ref 38–126)
Anion gap: 11 (ref 5–15)
BUN: 11 mg/dL (ref 8–23)
CO2: 21 mmol/L — ABNORMAL LOW (ref 22–32)
Calcium: 8.2 mg/dL — ABNORMAL LOW (ref 8.9–10.3)
Chloride: 107 mmol/L (ref 98–111)
Creatinine, Ser: 0.69 mg/dL (ref 0.44–1.00)
GFR, Estimated: >60 mL/min (ref >60)
Glucose, Bld: 78 mg/dL (ref 70–99)
Potassium: 2.9 mmol/L — ABNORMAL LOW (ref 3.5–5.1)
Sodium: 139 mmol/L (ref 135–145)
Total Bilirubin: 0.5 mg/dL (ref 0.0–1.2)
Total Protein: 4.1 g/dL — ABNORMAL LOW (ref 6.5–8.1)

## 2024-04-19 LAB — TROPONIN I (HIGH SENSITIVITY)
Troponin I (High Sensitivity): 265 ng/L (ref <18)
Troponin I (High Sensitivity): 279 ng/L (ref <18)
Troponin I (High Sensitivity): 226 ng/L (ref <18)

## 2024-04-19 LAB — MAGNESIUM: Magnesium: 1.5 mg/dL — ABNORMAL LOW (ref 1.7–2.4)

## 2024-04-19 LAB — LACTIC ACID, PLASMA
Lactic Acid, Venous: 1.2 mmol/L (ref 0.5–1.9)
Lactic Acid, Venous: 1.3 mmol/L (ref 0.5–1.9)

## 2024-04-19 LAB — CORTISOL: Cortisol, Plasma: 2.3 ug/dL

## 2024-04-19 LAB — PROCALCITONIN: Procalcitonin: <0.1 ng/mL

## 2024-04-19 MED ORDER — MAGNESIUM SULFATE 4 GM/100ML IV SOLN
4.0000 g | Freq: Once | INTRAVENOUS | Status: AC
  Administered 2024-04-19: 4 g via INTRAVENOUS
  Filled 2024-04-19: qty 100

## 2024-04-19 MED ORDER — ONDANSETRON HCL 4 MG PO TABS: 4.0000 mg | ORAL_TABLET | Freq: Four times a day (QID) | ORAL | Status: DC | PRN

## 2024-04-19 MED ORDER — POLYETHYLENE GLYCOL 3350 17 G PO PACK: 17.0000 g | PACK | Freq: Every day | ORAL | Status: DC | PRN

## 2024-04-19 MED ORDER — NONFORMULARY OR COMPOUNDED ITEM
1.0000 mg | Freq: Every day | Status: DC
  Administered 2024-04-19 – 2024-04-21 (×3): 1 mg via ORAL
  Filled 2024-04-19 (×4): qty 1

## 2024-04-19 MED ORDER — ALUM & MAG HYDROXIDE-SIMETH 200-200-20 MG/5ML PO SUSP
15.0000 mL | ORAL | Status: DC | PRN
  Administered 2024-04-19 – 2024-04-21 (×4): 15 mL via ORAL
  Filled 2024-04-19 (×5): qty 30

## 2024-04-19 MED ORDER — ONDANSETRON HCL 4 MG/2ML IJ SOLN: 4.0000 mg | Freq: Four times a day (QID) | INTRAMUSCULAR | Status: DC | PRN

## 2024-04-19 MED ORDER — B COMPLEX-C-FOLIC ACID PO TABS: 2.0000 | ORAL_TABLET | Freq: Every day | ORAL | Status: DC

## 2024-04-19 MED ORDER — PREGABALIN 75 MG PO CAPS
75.0000 mg | ORAL_CAPSULE | Freq: Every day | ORAL | Status: DC
  Administered 2024-04-19 – 2024-04-21 (×3): 75 mg via ORAL
  Filled 2024-04-19 (×3): qty 1

## 2024-04-19 MED ORDER — POTASSIUM CHLORIDE CRYS ER 20 MEQ PO TBCR
40.0000 meq | EXTENDED_RELEASE_TABLET | Freq: Once | ORAL | Status: AC
  Administered 2024-04-19: 40 meq via ORAL
  Filled 2024-04-19: qty 2

## 2024-04-19 MED ORDER — POTASSIUM CHLORIDE 10 MEQ/100ML IV SOLN
10.0000 meq | INTRAVENOUS | Status: AC
  Filled 2024-04-19 (×2): qty 100

## 2024-04-19 MED ORDER — LACTATED RINGERS IV BOLUS
1000.0000 mL | Freq: Once | INTRAVENOUS | Status: AC
  Administered 2024-04-19: 1000 mL via INTRAVENOUS

## 2024-04-19 MED ORDER — SODIUM CHLORIDE 0.9% FLUSH: 10.0000 mL | INTRAVENOUS | Status: DC | PRN

## 2024-04-19 MED ORDER — PREGABALIN 75 MG PO CAPS
150.0000 mg | ORAL_CAPSULE | Freq: Every day | ORAL | Status: DC
  Administered 2024-04-19 – 2024-04-20 (×2): 150 mg via ORAL
  Filled 2024-04-19 (×2): qty 2

## 2024-04-19 MED ORDER — POTASSIUM CHLORIDE 10 MEQ/100ML IV SOLN
10.0000 meq | INTRAVENOUS | Status: AC
  Administered 2024-04-19 (×4): 10 meq via INTRAVENOUS
  Filled 2024-04-19: qty 100

## 2024-04-19 MED ORDER — ROSUVASTATIN CALCIUM 20 MG PO TABS
20.0000 mg | ORAL_TABLET | Freq: Every day | ORAL | Status: DC
  Administered 2024-04-19 – 2024-04-21 (×3): 20 mg via ORAL
  Filled 2024-04-19 (×3): qty 1

## 2024-04-19 MED ORDER — APIXABAN 5 MG PO TABS
5.0000 mg | ORAL_TABLET | Freq: Two times a day (BID) | ORAL | Status: DC
  Administered 2024-04-19 – 2024-04-21 (×5): 5 mg via ORAL
  Filled 2024-04-19 (×5): qty 1

## 2024-04-19 MED ORDER — CHLORHEXIDINE GLUCONATE CLOTH 2 % EX PADS
6.0000 | MEDICATED_PAD | Freq: Every day | CUTANEOUS | Status: DC
  Administered 2024-04-19 – 2024-04-21 (×3): 6 via TOPICAL

## 2024-04-19 MED ORDER — NALTREXONE HCL (PAIN) 1.5 MG PO CAPS: 1.5000 mg | ORAL_CAPSULE | Freq: Every day | ORAL | Status: DC

## 2024-04-19 MED ORDER — SODIUM CHLORIDE 0.9% FLUSH
10.0000 mL | Freq: Two times a day (BID) | INTRAVENOUS | Status: DC
  Administered 2024-04-20: 20 mL
  Administered 2024-04-21: 10 mL

## 2024-04-19 MED ORDER — LACTATED RINGERS IV SOLN: INTRAVENOUS | Status: AC

## 2024-04-19 MED ORDER — TOPIRAMATE 25 MG PO TABS
50.0000 mg | ORAL_TABLET | Freq: Two times a day (BID) | ORAL | Status: DC
  Administered 2024-04-19 – 2024-04-21 (×5): 50 mg via ORAL
  Filled 2024-04-19 (×5): qty 2

## 2024-04-19 MED ORDER — PIPERACILLIN-TAZOBACTAM 3.375 G IVPB 30 MIN
3.3750 g | Freq: Once | INTRAVENOUS | Status: AC
  Administered 2024-04-19: 3.375 g via INTRAVENOUS
  Filled 2024-04-19: qty 50

## 2024-04-19 MED ORDER — VANCOMYCIN HCL 500 MG/100ML IV SOLN
500.0000 mg | Freq: Two times a day (BID) | INTRAVENOUS | Status: DC
  Administered 2024-04-20: 500 mg via INTRAVENOUS
  Filled 2024-04-19: qty 100

## 2024-04-19 MED ORDER — CYCLOBENZAPRINE HCL 10 MG PO TABS
10.0000 mg | ORAL_TABLET | Freq: Every day | ORAL | Status: DC
  Administered 2024-04-19 – 2024-04-20 (×2): 10 mg via ORAL
  Filled 2024-04-19 (×2): qty 1

## 2024-04-19 MED ORDER — PIPERACILLIN-TAZOBACTAM 3.375 G IVPB
3.3750 g | Freq: Three times a day (TID) | INTRAVENOUS | Status: DC
  Administered 2024-04-19 – 2024-04-20 (×4): 3.375 g via INTRAVENOUS
  Filled 2024-04-19 (×5): qty 50

## 2024-04-19 MED ORDER — ACETAMINOPHEN 325 MG PO TABS
650.0000 mg | ORAL_TABLET | ORAL | Status: DC | PRN
  Administered 2024-04-20 – 2024-04-21 (×3): 650 mg via ORAL
  Filled 2024-04-19 (×3): qty 2

## 2024-04-19 MED ORDER — VANCOMYCIN HCL IN DEXTROSE 1-5 GM/200ML-% IV SOLN
1000.0000 mg | Freq: Once | INTRAVENOUS | Status: AC
  Administered 2024-04-19: 1000 mg via INTRAVENOUS
  Filled 2024-04-19 (×2): qty 200

## 2024-04-19 MED ORDER — FAMOTIDINE 20 MG PO TABS
20.0000 mg | ORAL_TABLET | Freq: Every day | ORAL | Status: DC
  Administered 2024-04-19 – 2024-04-21 (×3): 20 mg via ORAL
  Filled 2024-04-19 (×3): qty 1

## 2024-04-19 MED ORDER — MIRABEGRON ER 50 MG PO TB24
50.0000 mg | ORAL_TABLET | Freq: Every day | ORAL | Status: DC
  Administered 2024-04-19 – 2024-04-21 (×3): 50 mg via ORAL
  Filled 2024-04-19 (×3): qty 1

## 2024-04-19 MED ORDER — PIPERACILLIN-TAZOBACTAM 3.375 G IVPB
3.3750 g | Freq: Three times a day (TID) | INTRAVENOUS | Status: DC
  Filled 2024-04-19 (×2): qty 50

## 2024-04-19 NOTE — H&P (Addendum)
 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 At the time CT showed features concerning for pyelonephritis and her UA was abnormal.  She was discharged home on oral antibiotics, but returned to the [facility] 11/24 reporting progressive weakness and fatigue.  On arrival she was was hypotensive with systolic in the 80s.  After a fluid bolus her BP initially improved. Lactic acid was unremarkable.  Creatinine had improved from 3 days prior. Patient had taken her blood pressure medicines.  She was placed on ceftriaxone  in the ED. During her time in the ER her hypotension has recurred. Volume resuscitation has continued. She is not tachycardic.   Assessment/Plan  Pyelonephritis  Continue broad spectrum abx - f/u blood and urine cultures - no evidence of complicating factors/abscess on CT abd/pelvis - monitor clinically and narrow abx as able   Sepsis POA w/ Hypotension  Pt hemodynamically unstable at the present with persistent hypotension despite initial volume resuscitation - complicated by use of home BB meds to include BB which is likely preventing full cardiac response - appears to be improving with repeat volume challenge - hold home BP meds/diuretics - cont to hydrate - follow BP trend closely in Progressive Care Unit for now - no indication for pressors thus far - appreciate PCCM evaluating at bedside   Hx 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 -  recovered On lifelong anticoag due to 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 - LV fxn fully recovered as of TTE October 2023 - no clinical evidence of volume overload - low concern for recurrent stress cardiomyophathy, but will continue to monitor for same given her hx  Modestly elevated troponin Likely simply due to demand in the setting of persisting hypotension and sepsis - check TTE - consult Cardiology if EF down or if wall motion abnormalities appreciated   Severe hypokalemia Supplement and follow  Severe hypomagnesemia Supplement and follow  Normocytic anemia  Hgb as normal at presentation (13.3) in dehydrated state - has declined to 10.2 w/ hydration, and likely to decline further w/ ongoing dilution - no clinical evidence of bleeding, but is on Eliquis  so will continue to monitor Hgb in serial fashion   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/16/2013   L3-4/notes 11/24/2013, arms, neck   Exertional asthma    GERD (gastroesophageal reflux disease)    Heart attack (HCC) 01/18/2022   Hypertension    Kidney stones    have always passed them   Neck pain  Osteomyelitis (HCC) 11/23/2013   osteomyelitis, discitis    Sleep concern    uses Prozac  for sleep    Stroke (HCC) 01/21/2022   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; left with aphasia, dysarthria, and apraxia    Past Surgical History:  Procedure Laterality Date   BREAST CYST EXCISION Left 2010   polypectomy   IR CT HEAD LTD  01/21/2022   IR CT  HEAD LTD  01/28/2022   IR PERCUTANEOUS ART THROMBECTOMY/INFUSION INTRACRANIAL INC DIAG ANGIO  01/21/2022   IR PERCUTANEOUS ART THROMBECTOMY/INFUSION INTRACRANIAL INC DIAG ANGIO  01/28/2022   IR US  GUIDE VASC ACCESS RIGHT  01/22/2022   PICC LINE PLACE PERIPHERAL ([facility] HX) Right    for use of Levaquin  & Vancomycin , of note: she reports that she had a flulike feeling    RADIOLOGY WITH ANESTHESIA N/A 01/21/2022   Procedure: IR WITH ANESTHESIA;  Surgeon: Dolphus Carrion, MD;  Location: [facility] OR;  Service: Radiology;  Laterality: N/A;   RADIOLOGY WITH ANESTHESIA N/A 01/28/2022   Procedure: IR WITH ANESTHESIA;  Surgeon: Radiologist, Medication, MD;  Location: [facility] OR;  Service: Radiology;  Laterality: N/A;   TONSILLECTOMY AND ADENOIDECTOMY  1975   TUBAL LIGATION  1999    Family History  Family History  Problem Relation Age of Onset   Other Mother    Other Father    Hypertension Father    Cancer Sister    Sleep apnea Sister     Social History   reports that she has never smoked. She has been exposed to tobacco smoke. She has never used smokeless tobacco. She reports current alcohol use of about 2.0 standard drinks of alcohol per week. She reports that she does not use drugs.  Allergies Allergies  Allergen Reactions   Doxycycline  Nausea Only    malaise    Erythromycin Nausea And Vomiting   Other Diarrhea    Oral Steroids   Vancomycin     Vicodin [Hydrocodone-Acetaminophen ] Other (See Comments)    Restless, tolerate percocet   Dilaudid  [Hydromorphone  Hcl] Rash    *Pt states she can take if given benadryl  prior*   Keflex [Cephalexin] Rash   Penicillins Rash    Hives   Sulfa Antibiotics Rash    Hives    Prior to Admission medications   Medication Sig Start Date End Date Taking? Authorizing Provider  acetaminophen  (TYLENOL ) 325 MG tablet Take 2 tablets (650 mg total) by mouth every 4 (four) hours as needed for mild pain (or temp > 37.5 C (99.5 F)). 02/04/22   Remi Pippin, NP  B  Complex-C-Folic Acid  TABS Take 2 tablets by mouth daily.    [provider]  carvedilol  (COREG ) 3.125 MG tablet Take 1 tablet (3.125 mg total) by mouth 2 (two) times daily. 02/24/24   Lelon Hamilton T, PA-C  ciprofloxacin  (CIPRO ) 500 MG tablet Take 1 tablet (500 mg total) by mouth 2 (two) times daily. 04/15/24   Dufour, Marry RAMAN, PA-C  cyclobenzaprine  (FLEXERIL ) 10 MG tablet TAKE ONE TABLET BY MOUTH AT BEDTIME 03/25/24   Raulkar, Sven SQUIBB, MD  ELIQUIS  5 MG TABS tablet Take 1 tablet (5 mg total) by mouth 2 (two) times daily. 11/04/23   Nahser, Aleene PARAS, MD  famotidine  (PEPCID ) 20 MG tablet Take 1 tablet (20 mg total) by mouth 2 (two) times daily. 02/24/24   Lelon Hamilton T, PA-C  Ginkgo Biloba 120 MG CAPS Take 2 capsules by mouth daily.    [provider]  loratadine  (CLARITIN ) 10 MG tablet Take  1 tablet (10 mg total) by mouth daily. 02/05/22   Remi Pippin, NP  Magnesium  300 MG CAPS PT TAKES GUMMIES MAG BID    [provider]  mirabegron  ER (MYRBETRIQ ) 50 MG TB24 tablet Take 50 mg by mouth daily. 12/31/21   [provider]  Naltrexone  HCl, Pain, 1.5 MG CAPS Take 1.5 mg by mouth daily at 6 (six) AM.    [provider]  ondansetron  (ZOFRAN -ODT) 4 MG disintegrating tablet Take 1 tablet (4 mg total) by mouth every 8 (eight) hours as needed for nausea or vomiting. 04/15/24   Dufour, Gabrielle S, PA-C  potassium chloride  (KLOR-CON ) 10 MEQ tablet Take 1 tablet (10 mEq total) by mouth 2 (two) times daily. 12/22/23   Lelon Hamilton T, PA-C  pregabalin  (LYRICA ) 100 MG capsule Take 1 capsule (100 mg total) by mouth daily. 09/09/23   Raulkar, Sven SQUIBB, MD  pregabalin  (LYRICA ) 75 MG capsule Take 1 capsule (75 mg total) by mouth daily. 01/07/24   Raulkar, Sven SQUIBB, MD  rosuvastatin  (CRESTOR ) 20 MG tablet Take 1 tablet (20 mg total) by mouth daily. 03/31/24   Lonni Slain, MD  Semaglutide -Weight Management (WEGOVY  [place]) Inject into the skin.    [provider]  spironolactone  (ALDACTONE ) 25 MG tablet Take 1/2 tablet (12.5 mg total) by mouth daily. 09/16/23   Nahser, Aleene PARAS, MD  topiramate  (TOPAMAX ) 25 MG tablet Take 1 tablet (25 mg total) by mouth daily as needed. 07/08/23   Raulkar, Sven SQUIBB, MD  topiramate  (TOPAMAX ) 50 MG tablet Take 1 tablet (50 mg total) by mouth 2 (two) times daily. 12/07/23   Raulkar, Sven SQUIBB, MD  UNABLE TO FIND Med Name: HAPPY HER SUPPLEMENT   SUPPORTS INFLAMMATION AND MOOD PER THE PT    [provider]  valsartan  (DIOVAN ) 80 MG tablet Take 1 tablet (80 mg total) by mouth daily. 12/29/23   Lelon Hamilton T, PA-C    Physical Exam: Vitals:   04/19/24 0545 04/19/24 0716 04/19/24 0810 04/19/24 0825  BP: (!) 78/49 (!) 87/59 (!) 92/54 (!) 80/55  Pulse: 81 76 77   Resp:  16 17   Temp:  98.3 F (36.8 C)    TempSrc:  Oral    SpO2: 96% 100% 100%   Weight:      Height:        Constitutional: NAD, calm, comfortable Eyes: PERRL, lids and conjunctivae normal ENMT: Mucous membranes are dry Neck: normal Respiratory: clear to auscultation bilaterally, no wheezing, no crackles. Normal respiratory effort. No accessory muscle use.  Cardiovascular: Regular rate and rhythm, no murmurs / rubs / gallops.  Abdomen: No tenderness or masses to palpation. No hepatosplenomegaly. Bowel sounds positive. Not distended. Soft.  Ext: No significant cyanosis, clubbing, edema  Neurologic: CN 2-12 grossly intact B. Sensation intact. Strength 5/5 in all 4 extremities. Some dysarthria with speech - voice soft but fully intelligible.  Psychiatric: Normal judgment and insight. Alert and oriented x 3. Flat affect.    CBC: Recent Labs  Lab 04/15/24 1118 04/18/24 1710 04/19/24 0752  WBC 15.6* 8.0 5.4  NEUTROABS  --  5.8 2.9  HGB 15.1* 13.3 10.2*  HCT 45.2 39.4 30.3*  MCV 86.4 84.0 85.1  PLT 313 233 143*   Basic Metabolic Panel: Recent Labs  Lab 04/15/24 1118 04/18/24 1710  NA 141 134*  K 4.2 3.6  CL 106 97*  CO2 14* 23   GLUCOSE 165* 99  BUN 21 25*  CREATININE 1.59* 1.08*  CALCIUM  10.8*  11.3*   GFR: Estimated Creatinine Clearance: 34.9 mL/min (A) (by C-G formula based on SCr of 1.08 mg/dL (H)).  Liver Function Tests: Recent Labs  Lab 04/15/24 1118 04/18/24 1710  AST 23 29  ALT 14 15  ALKPHOS 49 52  BILITOT 1.5* 0.3  PROT 7.7 6.4*  ALBUMIN  4.4 3.8   Recent Labs  Lab 04/15/24 1118  LIPASE 71*   Coagulation Profile: Recent Labs  Lab 04/18/24 1745  INR 1.5*   Urine analysis:    Component Value Date/Time   COLORURINE YELLOW 04/18/2024 1816   APPEARANCEUR CLEAR 04/18/2024 1816   APPEARANCEUR Cloudy (A) 09/09/2022 1623   LABSPEC 1.008 04/18/2024 1816   PHURINE 6.0 04/18/2024 1816   GLUCOSEU NEGATIVE 04/18/2024 1816   HGBUR NEGATIVE 04/18/2024 1816   BILIRUBINUR NEGATIVE 04/18/2024 1816   BILIRUBINUR Negative 09/09/2022 1623   KETONESUR TRACE (A) 04/18/2024 1816   PROTEINUR NEGATIVE 04/18/2024 1816   UROBILINOGEN 0.2 11/16/2013 2159   NITRITE NEGATIVE 04/18/2024 1816   LEUKOCYTESUR MODERATE (A) 04/18/2024 1816    DG Chest Port 1 View Result Date: 04/18/2024 IMPRESSION: 1. No acute cardiopulmonary process identified. Electronically signed by: Elsie Gravely MD 04/18/2024 06:22 PM EST RP Workstation: [id number]    Reyes IVAR Moores, MD [facility] Office  [phone] Pager - Text Page per Amion as per below:  On-Call/Text Page:      [email or web address]  If 7PM-7AM, please contact night-coverage [email or web address] 04/19/2024, 8:41 AM

## 2024-04-19 NOTE — Plan of Care (Signed)

## 2024-04-19 NOTE — Progress Notes (Signed)
 Peripherally Inserted Central Catheter Placement  The IV Nurse has discussed with the patient and/or persons authorized to consent for the patient, the purpose of this procedure and the potential benefits and risks involved with this procedure.  The benefits include less needle sticks, lab draws from the catheter, and the patient may be discharged home with the catheter. Risks include, but not limited to, infection, bleeding, blood clot (thrombus formation), and puncture of an artery; nerve damage and irregular heartbeat and possibility to perform a PICC exchange if needed/ordered by physician.  Alternatives to this procedure were also discussed.  Bard Power PICC patient education guide, fact sheet on infection prevention and patient information card has been provided to patient /or left at bedside.    PICC Placement Documentation  PICC Double Lumen 04/19/24 Right Basilic 32 cm 0 cm (Active)  Indication for Insertion or Continuance of Line Prolonged intravenous therapies 04/19/24 1911  Exposed Catheter (cm) 0 cm 04/19/24 1911  Site Assessment Clean, Dry, Intact 04/19/24 1911  Lumen #1 Status Saline locked;Flushed;Blood return noted 04/19/24 1911  Lumen #2 Status Saline locked;Flushed;Blood return noted 04/19/24 1911  Dressing Type Transparent;Securing device 04/19/24 1911  Dressing Status Antimicrobial disc/dressing in place;Clean, Dry, Intact 04/19/24 1911  Line Care Connections checked and tightened 04/19/24 1911  Line Adjustment (NICU/IV Team Only) No 04/19/24 1911  Dressing Intervention New dressing;Adhesive placed at insertion site (IV team only) 04/19/24 1911  Dressing Change Due 04/26/24 04/19/24 1911       Chloe Johnson 04/19/2024, 7:12 PM

## 2024-04-19 NOTE — Consult Note (Signed)
 NAME:  Chloe Johnson, MRN:  [id number], DOB:  [birth year], LOS: 1 ADMISSION DATE:  04/18/2024, CONSULTATION DATE:  04/19/24 REFERRING MD:  Dr. 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  Pt recently seen in ER on 11/21 after 5 days of N/V and abd discomfort with CT a/p with concern of pyelonephritis discharged home on cipro .  Last dose of GLP last Wednesday.  Pt states the cipro  caused her chest to hurt and she was switched to keflex.  Since she had continued to feel week but denies any fever, chills, N/V, SOB, CP, abd pain, back pain beyond her chronic back pain, urinary symptoms.  Is eating and drinking better since Saturday and voiding.  Reports she accidentally took her blood pressure medications yesterday and about 2 hrs later felt dizzy going to the bathroom and passed out per her husband but he caught her and took her to ER.  Patient does also report her SBP was in the 90's recently at outpatient cardiology appt and they had discussed reducing her BP meds but havent yet as was not symptomatic.    On arrival to [facility] ER 11/24, she was hypotensive which improved after 1.7L fluids.  Lactic was normal, afebrile, WBC 8, UA +leukocytes, rare bacteria, 0-5 WBC, sCr improved 1.59 (11/21)> 1.08, LFTs normal, trop 91> 72, EKG unchanged, no acute STE, noted prolonged Qtc 515.  Empirically started on ctx coverage after cultures.  She was transferred to [facility] for further monitoring and evaluation.  On arrival, pt found to be hypotensive again with SBP's 70-80, mental status stable.  Additional 2L LR being given and abx coverage expanded to zosyn / vanc.   PCCM consulted for recurrent hypotension.    Pertinent  Medical History   Past Medical History:  Diagnosis  Date   Anxiety    Back pain    Discitis of lumbar region 11/16/2013   L3-4/notes 11/24/2013, arms, neck   Exertional asthma    GERD (gastroesophageal reflux disease)    Heart attack (HCC) 01/18/2022   Hypertension    Kidney stones    have always passed them   Neck pain    Osteomyelitis (HCC) 11/23/2013   osteomyelitis, discitis    Sleep concern    uses Prozac  for sleep    Stroke (HCC) 01/21/2022   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; left with aphasia, dysarthria, and apraxia   Significant Hospital Events: Including procedures, antibiotic start and stop dates in addition to other pertinent events   11/25 admit  Interim History / Subjective:  Pt currently denies any complaint other than fatigue   Objective    Blood pressure (!) 87/59, pulse 76, temperature 98.3 F (36.8 C), temperature source Oral, resp. rate 16, height 4' 11 (1.499 m), weight 51 kg, SpO2 100%.        Intake/Output Summary (Last 24 hours) at 04/19/2024 0803 Last data filed at 04/19/2024 9487 Gross per 24 hour  Intake 2790 ml  Output 300 ml  Net 2490 ml   Filed Weights   04/18/24 2250  Weight: 51 kg    Examination: General:  Pleasant older adult female lying in bed in NAD HEENT: MM pink/dry, pupils 3/r, no JVD Neuro: Aox 4, MAE- some slowed speech, some expressive aphasia at times but does very  well overall, RUE chronic 4/5, otherwise 5/5 CV: rr, NSR, no murmur, distal warm, +2 pulses PULM:  non labored, CTA, RA GI: soft, bs+, NT, voids Extremities: warm/dry, no LE edema  Skin: no rashes    Resolved problem list   Assessment and Plan   Hypotension P:  - likely multifactorial in setting of taking multiple BP meds (spironolactone , coreg , and valsartan ) and ongoing dehydration.  At this time BP is improving with additional 2L fluids and neurologically at baseline, in no acute distress.  She is warm and perfused distally.  Ok to stay in PCU for now, no need for ICU transfer at this time.    -  repeat am labs pending> additionally rechecking lactic and PCT - abx per primary team, follow cultures - likely needs adjustment in her home BP meds given, defer to primary team - agree with echo   Nothing further to add.  PCCM will sign off.  Please call us  back if we can be of any further assistance.    Labs   CBC: Recent Labs  Lab 04/15/24 1118 04/18/24 1710  WBC 15.6* 8.0  NEUTROABS  --  5.8  HGB 15.1* 13.3  HCT 45.2 39.4  MCV 86.4 84.0  PLT 313 233    Basic Metabolic Panel: Recent Labs  Lab 04/15/24 1118 04/18/24 1710  NA 141 134*  K 4.2 3.6  CL 106 97*  CO2 14* 23  GLUCOSE 165* 99  BUN 21 25*  CREATININE 1.59* 1.08*  CALCIUM  10.8* 11.3*   GFR: Estimated Creatinine Clearance: 34.9 mL/min (A) (by C-G formula based on SCr of 1.08 mg/dL (H)). Recent Labs  Lab 04/15/24 1118 04/18/24 1710  WBC 15.6* 8.0  LATICACIDVEN  --  1.4    Liver Function Tests: Recent Labs  Lab 04/15/24 1118 04/18/24 1710  AST 23 29  ALT 14 15  ALKPHOS 49 52  BILITOT 1.5* 0.3  PROT 7.7 6.4*  ALBUMIN  4.4 3.8   Recent Labs  Lab 04/15/24 1118  LIPASE 71*   No results for input(s): AMMONIA in the last 168 hours.  ABG    Component Value Date/Time   PHART 7.330 (L) 01/28/2022 1747   PCO2ART 44.7 01/28/2022 1747   PO2ART 495 (H) 01/28/2022 1747   HCO3 23.7 01/28/2022 1747   TCO2 25 01/28/2022 1747   ACIDBASEDEF 2.0 01/28/2022 1747   O2SAT 100 01/28/2022 1747     Coagulation Profile: Recent Labs  Lab 04/18/24 1745  INR 1.5*    Cardiac Enzymes: No results for input(s): CKTOTAL, CKMB, CKMBINDEX, TROPONINI in the last 168 hours.  HbA1C: Hgb A1c MFr Bld  Date/Time Value Ref Range Status  01/22/2022 03:51 AM 5.0 4.8 - 5.6 % Final    Comment:    (NOTE) Pre diabetes:          5.7%-6.4%  Diabetes:              >6.4%  Glycemic control for   <7.0% adults with diabetes     CBG: No results for input(s): GLUCAP in the last 168 hours.  Review of  Systems:   Review of Systems  Constitutional:  Positive for malaise/fatigue. Negative for chills and fever.  Respiratory:  Negative for cough, sputum production and shortness of breath.   Cardiovascular:  Negative for chest pain and leg swelling.  Gastrointestinal:  Negative for abdominal pain, melena, nausea and vomiting.  Genitourinary:  Negative for dysuria, flank pain, frequency and urgency.  Musculoskeletal:  Positive for back pain.  Past Medical History:  She,  has a past medical history of Anxiety, Back pain, Discitis of lumbar region (11/16/2013), Exertional asthma, GERD (gastroesophageal reflux disease), Heart attack (HCC) (01/18/2022), Hypertension, Kidney stones, Neck pain, Osteomyelitis (HCC) (11/23/2013), Sleep concern, and Stroke (HCC) (01/21/2022).   Surgical History:   Past Surgical History:  Procedure Laterality Date   BREAST CYST EXCISION Left 2010   polypectomy   IR CT HEAD LTD  01/21/2022   IR CT HEAD LTD  01/28/2022   IR PERCUTANEOUS ART THROMBECTOMY/INFUSION INTRACRANIAL INC DIAG ANGIO  01/21/2022   IR PERCUTANEOUS ART THROMBECTOMY/INFUSION INTRACRANIAL INC DIAG ANGIO  01/28/2022   IR US  GUIDE VASC ACCESS RIGHT  01/22/2022   PICC LINE PLACE PERIPHERAL ([facility] HX) Right    for use of Levaquin  & Vancomycin , of note: she reports that she had a flulike feeling    RADIOLOGY WITH ANESTHESIA N/A 01/21/2022   Procedure: IR WITH ANESTHESIA;  Surgeon: Dolphus Carrion, MD;  Location: [facility] OR;  Service: Radiology;  Laterality: N/A;   RADIOLOGY WITH ANESTHESIA N/A 01/28/2022   Procedure: IR WITH ANESTHESIA;  Surgeon: Radiologist, Medication, MD;  Location: [facility] OR;  Service: Radiology;  Laterality: N/A;   TONSILLECTOMY AND ADENOIDECTOMY  1975   TUBAL LIGATION  1999     Social History:   reports that she has never smoked. She has been exposed to tobacco smoke. She has never used smokeless tobacco. She reports current alcohol use of about 2.0 standard drinks of alcohol per week. She  reports that she does not use drugs.   Family History:  Her family history includes Cancer in her sister; Hypertension in her father; Other in her father and mother; Sleep apnea in her sister.   Allergies Allergies  Allergen Reactions   Doxycycline  Nausea Only    malaise    Erythromycin Nausea And Vomiting   Other Diarrhea    Oral Steroids   Vancomycin     Vicodin [Hydrocodone-Acetaminophen ] Other (See Comments)    Restless, tolerate percocet   Dilaudid  [Hydromorphone  Hcl] Rash    *Pt states she can take if given benadryl  prior*   Keflex [Cephalexin] Rash   Penicillins Rash    Hives   Sulfa Antibiotics Rash    Hives     Home Medications  Prior to Admission medications   Medication Sig Start Date End Date Taking? Authorizing Provider  acetaminophen  (TYLENOL ) 325 MG tablet Take 2 tablets (650 mg total) by mouth every 4 (four) hours as needed for mild pain (or temp > 37.5 C (99.5 F)). 02/04/22   Remi Pippin, NP  B Complex-C-Folic Acid  TABS Take 2 tablets by mouth daily.    [provider]  carvedilol  (COREG ) 3.125 MG tablet Take 1 tablet (3.125 mg total) by mouth 2 (two) times daily. 02/24/24   Lelon [place] T, PA-C  ciprofloxacin  (CIPRO ) 500 MG tablet Take 1 tablet (500 mg total) by mouth 2 (two) times daily. 04/15/24   Dufour, Marry RAMAN, PA-C  cyclobenzaprine  (FLEXERIL ) 10 MG tablet TAKE ONE TABLET BY MOUTH AT BEDTIME 03/25/24   Raulkar, Sven SQUIBB, MD  ELIQUIS  5 MG TABS tablet Take 1 tablet (5 mg total) by mouth 2 (two) times daily. 11/04/23   Nahser, Aleene PARAS, MD  famotidine  (PEPCID ) 20 MG tablet Take 1 tablet (20 mg total) by mouth 2 (two) times daily. 02/24/24   Lelon [place] T, PA-C  Ginkgo Biloba 120 MG CAPS Take 2 capsules by mouth daily.    [provider]  loratadine  (CLARITIN ) 10 MG  tablet Take 1 tablet (10 mg total) by mouth daily. 02/05/22   Remi Pippin, NP  Magnesium  300 MG CAPS PT TAKES GUMMIES MAG BID    [provider]  mirabegron  ER  (MYRBETRIQ ) 50 MG TB24 tablet Take 50 mg by mouth daily. 12/31/21   [provider]  Naltrexone  HCl, Pain, 1.5 MG CAPS Take 1.5 mg by mouth daily at 6 (six) AM.    [provider]  ondansetron  (ZOFRAN -ODT) 4 MG disintegrating tablet Take 1 tablet (4 mg total) by mouth every 8 (eight) hours as needed for nausea or vomiting. 04/15/24   Dufour, Marry RAMAN, PA-C  potassium chloride  (KLOR-CON ) 10 MEQ tablet Take 1 tablet (10 mEq total) by mouth 2 (two) times daily. 12/22/23   Lelon [place] T, PA-C  pregabalin  (LYRICA ) 100 MG capsule Take 1 capsule (100 mg total) by mouth daily. 09/09/23   Raulkar, Sven SQUIBB, MD  pregabalin  (LYRICA ) 75 MG capsule Take 1 capsule (75 mg total) by mouth daily. 01/07/24   Raulkar, Sven SQUIBB, MD  rosuvastatin  (CRESTOR ) 20 MG tablet Take 1 tablet (20 mg total) by mouth daily. 03/31/24   Lonni Slain, MD  Semaglutide -Weight Management (WEGOVY  [place]) Inject into the skin.    [provider]  spironolactone  (ALDACTONE ) 25 MG tablet Take 1/2 tablet (12.5 mg total) by mouth daily. 09/16/23   Nahser, Aleene PARAS, MD  topiramate  (TOPAMAX ) 25 MG tablet Take 1 tablet (25 mg total) by mouth daily as needed. 07/08/23   Raulkar, Sven SQUIBB, MD  topiramate  (TOPAMAX ) 50 MG tablet Take 1 tablet (50 mg total) by mouth 2 (two) times daily. 12/07/23   Raulkar, Sven SQUIBB, MD  UNABLE TO FIND Med Name: HAPPY HER SUPPLEMENT   SUPPORTS INFLAMMATION AND MOOD PER THE PT    [provider]  valsartan  (DIOVAN ) 80 MG tablet Take 1 tablet (80 mg total) by mouth daily. 12/29/23   Lelon [place] DASEN, PA-C           Lyle Pesa, NP [facility] 04/19/2024, 8:03 AM  See Amion for pager If no response to pager , please call [phone] until 7pm After 7:00 pm call Elink  [phone]

## 2024-04-19 NOTE — Progress Notes (Signed)
 Pharmacy Antibiotic Note  Chloe Johnson is a 68 y.o. female admitted on 04/18/2024 with sepsis.  Pharmacy has been consulted for vancomycin  and Zosyn  dosing.  Of note pt has multiple allergies listed.  Pt was initially started on ceftriaxone  despite cephalosporin allergy documented and pt had no reaction.  PCN allergy listed with reaction as rash; feel comfortable challenging this given positive experience with ceftriaxone .  Vancomycin  also listed as an allergy without a reaction; pt did have a long course of vanc in the past and on chart review pt reported flu-like feeling, which sounds like an infusion reaction, not a true allergy.  Plan: Vancomycin  1000mg  x1 then 500mg  IV Q24H. Goal AUC 400-550.  Expected AUC 410.  If SCr improves may need to increase dose.  Will slow infusion rate to prevent infusion reaction. Zosyn  3.375g IV q8h (4 hour infusion). Monitor for signs of adverse reactions.  Height: 4' 11 (149.9 cm) Weight: 51 kg (112 lb 7 oz) IBW/kg (Calculated) : 43.2  Temp (24hrs), Avg:98.1 F (36.7 C), Min:97.6 F (36.4 C), Max:98.9 F (37.2 C)  Recent Labs  Lab 04/15/24 1118 04/18/24 1710  WBC 15.6* 8.0  CREATININE 1.59* 1.08*  LATICACIDVEN  --  1.4    Estimated Creatinine Clearance: 34.9 mL/min (A) (by C-G formula based on SCr of 1.08 mg/dL (H)).    Allergies  Allergen Reactions   Doxycycline  Nausea Only    malaise    Erythromycin Nausea And Vomiting   Other Diarrhea    Oral Steroids   Vancomycin     Vicodin [Hydrocodone-Acetaminophen ] Other (See Comments)    Restless, tolerate percocet   Dilaudid  [Hydromorphone  Hcl] Rash    *Pt states she can take if given benadryl  prior*   Keflex [Cephalexin] Rash   Penicillins Rash    Hives   Sulfa Antibiotics Rash    Hives    Thank you for allowing pharmacy to be a part of this patient's care.  Marvetta Dauphin, PharmD, BCPS  04/19/2024 7:18 AM

## 2024-04-20 ENCOUNTER — Inpatient Hospital Stay (HOSPITAL_COMMUNITY)

## 2024-04-20 DIAGNOSIS — R7989 Other specified abnormal findings of blood chemistry: Secondary | ICD-10-CM | POA: Diagnosis not present

## 2024-04-20 DIAGNOSIS — I9589 Other hypotension: Secondary | ICD-10-CM | POA: Diagnosis not present

## 2024-04-20 DIAGNOSIS — I959 Hypotension, unspecified: Secondary | ICD-10-CM | POA: Diagnosis not present

## 2024-04-20 LAB — ECHOCARDIOGRAM COMPLETE
AR max vel: 2.91 cm2
AV Area VTI: 2.82 cm2
AV Area mean vel: 2.88 cm2
AV Mean grad: 3 mmHg
AV Peak grad: 6 mmHg
Ao pk vel: 1.22 m/s
Area-P 1/2: 3.77 cm2
Height: 59 in
S' Lateral: 2.5 cm
Weight: 1798.95 [oz_av]

## 2024-04-20 LAB — PHOSPHORUS: Phosphorus: 2.4 mg/dL — ABNORMAL LOW (ref 2.5–4.6)

## 2024-04-20 LAB — COMPREHENSIVE METABOLIC PANEL WITH GFR
ALT: 13 U/L (ref 0–44)
AST: 22 U/L (ref 15–41)
Albumin: 2.1 g/dL — ABNORMAL LOW (ref 3.5–5.0)
Alkaline Phosphatase: 29 U/L — ABNORMAL LOW (ref 38–126)
Anion gap: 8 (ref 5–15)
BUN: <5 mg/dL — ABNORMAL LOW (ref 8–23)
CO2: 23 mmol/L (ref 22–32)
Calcium: 8.1 mg/dL — ABNORMAL LOW (ref 8.9–10.3)
Chloride: 108 mmol/L (ref 98–111)
Creatinine, Ser: 0.66 mg/dL (ref 0.44–1.00)
GFR, Estimated: >60 mL/min (ref >60)
Glucose, Bld: 82 mg/dL (ref 70–99)
Potassium: 3.3 mmol/L — ABNORMAL LOW (ref 3.5–5.1)
Sodium: 139 mmol/L (ref 135–145)
Total Bilirubin: 0.7 mg/dL (ref 0.0–1.2)
Total Protein: 3.8 g/dL — ABNORMAL LOW (ref 6.5–8.1)

## 2024-04-20 LAB — CBC
HCT: 25.9 % — ABNORMAL LOW (ref 36.0–46.0)
Hemoglobin: 8.7 g/dL — ABNORMAL LOW (ref 12.0–15.0)
MCH: 28.5 pg (ref 26.0–34.0)
MCHC: 33.6 g/dL (ref 30.0–36.0)
MCV: 84.9 fL (ref 80.0–100.0)
Platelets: 130 K/uL — ABNORMAL LOW (ref 150–400)
RBC: 3.05 MIL/uL — ABNORMAL LOW (ref 3.87–5.11)
RDW: 13.2 % (ref 11.5–15.5)
WBC: 3.9 K/uL — ABNORMAL LOW (ref 4.0–10.5)
nRBC: 0 % (ref 0.0–0.2)

## 2024-04-20 LAB — MAGNESIUM: Magnesium: 2.1 mg/dL (ref 1.7–2.4)

## 2024-04-20 LAB — URINE CULTURE
Culture: NO GROWTH
Special Requests: NORMAL

## 2024-04-20 LAB — TROPONIN I (HIGH SENSITIVITY): Troponin I (High Sensitivity): 168 ng/L (ref <18)

## 2024-04-20 LAB — HEMOGLOBIN AND HEMATOCRIT, BLOOD
HCT: 28.5 % — ABNORMAL LOW (ref 36.0–46.0)
Hemoglobin: 9.6 g/dL — ABNORMAL LOW (ref 12.0–15.0)

## 2024-04-20 MED ORDER — VANCOMYCIN HCL 750 MG/150ML IV SOLN: 750.0000 mg | INTRAVENOUS | Status: DC

## 2024-04-20 MED ORDER — COSYNTROPIN 0.25 MG IJ SOLR
0.2500 mg | Freq: Once | INTRAMUSCULAR | Status: AC
  Administered 2024-04-21: 0.25 mg via INTRAVENOUS
  Filled 2024-04-20: qty 0.25

## 2024-04-20 MED ORDER — POTASSIUM CHLORIDE CRYS ER 20 MEQ PO TBCR
40.0000 meq | EXTENDED_RELEASE_TABLET | ORAL | Status: AC
  Administered 2024-04-20 (×2): 40 meq via ORAL
  Filled 2024-04-20 (×2): qty 2

## 2024-04-20 MED ORDER — AMOXICILLIN-POT CLAVULANATE 875-125 MG PO TABS
1.0000 | ORAL_TABLET | Freq: Two times a day (BID) | ORAL | Status: DC
  Administered 2024-04-20 – 2024-04-21 (×2): 1 via ORAL
  Filled 2024-04-20 (×2): qty 1

## 2024-04-20 NOTE — TOC Initial Note (Signed)
 Transition of Care [facility]) - Initial/Assessment Note    Patient Details  Name: Chloe Johnson MRN: [id number] Date of Birth: June 22, 1955  [facility] CM/SW Contact:    Sudie Erminio Deems, RN Phone Number: 04/20/2024, 2:00 PM  Clinical Narrative: Patient presented for hypotension. PTA patient was from home with family support. Patient has DME rolling walker, cane, and shower chair in the home. Patient states she does not have any home health services in the home. Patient has insurance and PCP- has no issues with transportation. No home needs identified at this time. ICM will continue to follow for additional disposition needs.                 Expected Discharge Plan: Home/Self Care Barriers to Discharge: Continued Medical Work up   Patient Goals and CMS Choice Patient states their goals for this hospitalization and ongoing recovery are:: Plans to return home once stable.  Expected Discharge Plan and Services   Discharge Planning Services: CM Consult Post Acute Care Choice: NA Living arrangements for the past 2 months: Single Family Home                   DME Agency: NA       HH Arranged: NA   Prior Living Arrangements/Services Living arrangements for the past 2 months: Single Family Home Lives with:: Spouse Patient language and need for interpreter reviewed:: Yes Do you feel safe going back to the place where you live?: Yes      Need for Family Participation in Patient Care: Yes (Comment) Care giver support system in place?: Yes (comment) Current home services: DME (cane, shower cahir, rolling walker.) Criminal Activity/Legal Involvement Pertinent to Current Situation/Hospitalization: No - Comment as needed  Activities of Daily Living   ADL Screening (condition at time of admission) Independently performs ADLs?: Yes (appropriate for developmental age) Is the patient deaf or have difficulty hearing?: No Does the patient have difficulty seeing, even when  wearing glasses/contacts?: No Does the patient have difficulty concentrating, remembering, or making decisions?: No  Permission Sought/Granted Permission sought to share information with : Case Manager, Family Supports   Emotional Assessment Appearance:: Appears stated age Attitude/Demeanor/Rapport: Engaged Affect (typically observed): Appropriate Orientation: : Oriented to Self, Oriented to Place Alcohol / Substance Use: Not Applicable Psych Involvement: No (comment)  Admission diagnosis:  Troponin level elevated [R79.89] Hypotension [I95.9] Hypotension, unspecified hypotension type [I95.9] Patient Active Problem List   Diagnosis Date Noted   ARF (acute renal failure) 04/19/2024   Hypotension 04/18/2024   Aphasia 03/31/2022   Snores 03/31/2022   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   UTI (urinary tract infection) 02/04/2022   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   Stroke determined by clinical assessment (HCC) 01/28/2022   Middle cerebral artery embolism, left 01/28/2022   H/O ischemic left MCA stroke    Acute respiratory failure (HCC)    CVA (cerebral vascular accident) (HCC) 01/21/2022   NSTEMI (non-ST elevated myocardial infarction) (HCC) 01/21/2022   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   HFrEF (heart failure with reduced ejection fraction) (HCC) 01/21/2022   Hyperlipidemia 01/21/2022   Lumbar stenosis 01/21/2022   Stroke (cerebrum) (HCC) 01/21/2022   S/P lumbar spinal fusion 11/15/2014   Lumbar discitis 11/29/2013   Discitis 11/24/2013   Diarrhea 11/17/2013   Osteomyelitis of lumbar spine (HCC) 11/16/2013   Hypertension    Asthma    Anxiety    PCP:  Dwight Trula SQUIBB, MD Pharmacy:   [facility] - [place], [place] - [address] [place] [address] Phone: [phone] Fax: [phone]  Social Drivers of Health (SDOH) Social History: SDOH Screenings   Food Insecurity: No Food  Insecurity (04/18/2024)  Housing: Low Risk  (04/18/2024)  Transportation Needs: No Transportation Needs (04/18/2024)  Utilities: Not At Risk (04/18/2024)  Depression (PHQ2-9): Low Risk  (04/08/2024)  Social Connections: Socially Integrated (04/18/2024)  Tobacco Use: Low Risk  (04/19/2024)   SDOH Interventions: Food Insecurity Interventions: Intervention Not Indicated Housing Interventions: Intervention Not Indicated Transportation Interventions: Intervention Not Indicated Utilities Interventions: Intervention Not Indicated Social Connections Interventions: Intervention Not Indicated  Readmission Risk Interventions     No data to display

## 2024-04-20 NOTE — Progress Notes (Signed)
 Pharmacy Antibiotic Note  Chloe Johnson is a 68 y.o. female admitted on 04/18/2024 with sepsis.  Pharmacy has been consulted for vancomycin  and Zosyn  dosing.  Of note pt has multiple allergies listed.  Pt was initially started on ceftriaxone  despite cephalosporin allergy documented and pt had no reaction.  PCN allergy listed with reaction as rash; feel comfortable challenging this given positive experience with ceftriaxone .  Vancomycin  also listed as an allergy without a reaction; pt did have a long course of vanc in the past and on chart review pt reported flu-like feeling, which sounds like an infusion reaction, not a true allergy.  -WBC= 3.9, afeb -cultures- ngtd  Plan: -Change vancomycin  to 750mg  IV q24h (estimated AUC= 468). Will slow infusion rate to prevent infusion reaction  -Zosyn  3.375g IV q8h (4 hour infusion). -Monitor for signs of adverse reactions.  Height: 4' 11 (149.9 cm) Weight: 51 kg (112 lb 7 oz) IBW/kg (Calculated) : 43.2  Temp (24hrs), Avg:98 F (36.7 C), Min:97.5 F (36.4 C), Max:98.5 F (36.9 C)  Recent Labs  Lab 04/15/24 1118 04/18/24 1710 04/19/24 0752 04/19/24 0926 04/20/24 0534  WBC 15.6* 8.0 5.4  --  3.9*  CREATININE 1.59* 1.08* 0.69  --  0.66  LATICACIDVEN  --  1.4 1.2 1.3  --     Estimated Creatinine Clearance: 47.2 mL/min (by C-G formula based on SCr of 0.66 mg/dL).    Allergies  Allergen Reactions   Doxycycline  Nausea Only    malaise    Erythromycin Nausea And Vomiting   Other Diarrhea    Oral Steroids   Vancomycin     Vicodin [Hydrocodone-Acetaminophen ] Other (See Comments)    Restless, tolerate percocet   Dilaudid  [Hydromorphone  Hcl] Rash    *Pt states she can take if given benadryl  prior*   Keflex [Cephalexin] Rash   Penicillins Rash    Hives   Sulfa Antibiotics Rash    Hives    Thank you for allowing pharmacy to be a part of this patient's care.  Prentice Poisson, PharmD Clinical Pharmacist **Pharmacist phone directory can now  be found on [email or web address] ([email or web address]).  Listed under [facility].

## 2024-04-20 NOTE — Progress Notes (Signed)
 Echocardiogram Attempted exam at 1055am. Physician in room, requested to try again later.  Chloe Johnson 04/20/2024, 11:00 AM

## 2024-04-20 NOTE — Progress Notes (Signed)
 Echocardiogram 2D Echocardiogram has been performed.  Juliene JINNY Rucks 04/20/2024, 1:49 PM

## 2024-04-20 NOTE — Progress Notes (Signed)
 PROGRESS NOTE    Chloe Johnson  FMW:[id number] DOB: 10-11-1955 DOA: 04/18/2024 PCP: Dwight Trula SQUIBB, MD  No chief complaint on file.   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 At the time CT showed features concerning for pyelonephritis and her UA was abnormal.  She was discharged home on oral antibiotics, but returned to the [facility] 11/24 reporting progressive weakness and fatigue.  On arrival she was was hypotensive with systolic in the 80s.  After Teo Moede fluid bolus her BP initially improved. Lactic acid was unremarkable.  Creatinine had improved from 3 days prior. Patient had taken her blood pressure medicines.  She was placed on ceftriaxone  in the ED. During her time in the ER her hypotension has recurred. Volume resuscitation has continued. She is not tachycardic.   Assessment & Plan:   Principal Problem:   Hypotension Active Problems:   LV (left ventricular) mural thrombus   Takotsubo cardiomyopathy   Aphasia   ARF (acute renal failure)  Pyelonephritis  CT with findings concerning for pyelonephritis UA from 11/24 not particularly impressive (UA 11/21 c/w UTI wuith large LE, negative nitrite, 11-20 RBC, > 50 WBC) Unfortunately, doubt we'll get good culture data as no culture collected 11/21 and had been on antibiotics when we collected cultures 11/24.   Blood cultures 11/24 NG Urine culture with no growth Will d/c vanc.  Narrow to augmentin .  Note many antibiotic intolerances, including penicillin (rash).  Has tolerated zosyn  here.  Will discuss augmentin .     Hypotension  Syncopal Episode Episode of LOC in setting of hypotension Sepsis ruled out -  normal WBC count at presentation.  Afebrile.  Normal HR, normal RR.  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 V5, V6).  Short PR. 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 Has undergone extensive rehab with impressive results - still some mild dysarthria but able to communicate fully in quiet intentional voice     DVT prophylaxis: eliquis  Code Status: full Family Communication: friend at bedside, husband Larue) over phone Disposition:   Status is: Inpatient Remains inpatient appropriate because: need for continued inpatient care   Consultants:  none  Procedures:  none  Antimicrobials:  Anti-infectives (From admission, onward)  Start     Dose/Rate Route Frequency Ordered Stop   04/21/24 0800  vancomycin  (VANCOREADY) IVPB 750 mg/150 mL        750 mg 75 mL/hr over 120 Minutes Intravenous Every 24 hours 04/20/24 0847     04/20/24 0800  vancomycin   (VANCOREADY) IVPB 500 mg/100 mL  Status:  Discontinued        500 mg 50 mL/hr over 120 Minutes Intravenous Every 12 hours 04/19/24 0715 04/20/24 0847   04/19/24 1800  cefTRIAXone  (ROCEPHIN ) 1 g in sodium chloride  0.9 % 100 mL IVPB  Status:  Discontinued        1 g 200 mL/hr over 30 Minutes Intravenous Every 24 hours 04/18/24 2350 04/19/24 0723   04/19/24 1630  piperacillin -tazobactam (ZOSYN ) IVPB 3.375 g        3.375 g 12.5 mL/hr over 240 Minutes Intravenous Every 8 hours 04/19/24 1616     04/19/24 1400  piperacillin -tazobactam (ZOSYN ) IVPB 3.375 g  Status:  Discontinued        3.375 g 12.5 mL/hr over 240 Minutes Intravenous Every 8 hours 04/19/24 0715 04/19/24 1616   04/19/24 0815  piperacillin -tazobactam (ZOSYN ) IVPB 3.375 g        3.375 g 100 mL/hr over 30 Minutes Intravenous  Once 04/19/24 0715 04/20/24 0743   04/19/24 0815  vancomycin  (VANCOCIN ) IVPB 1000 mg/200 mL premix        1,000 mg 100 mL/hr over 120 Minutes Intravenous  Once 04/19/24 0715 04/20/24 0743   04/18/24 1715  cefTRIAXone  (ROCEPHIN ) 2 g in sodium chloride  0.9 % 100 mL IVPB        2 g 200 mL/hr over 30 Minutes Intravenous Once 04/18/24 1706 04/18/24 1829       Subjective: No new complaints  Objective: Vitals:   04/19/24 2040 04/19/24 2348 04/20/24 0350 04/20/24 0827  BP: 112/71 105/60 (!) 103/53 113/62  Pulse: 80 78 78 88  Resp: 19 17 18 16   Temp: 98 F (36.7 C) 98 F (36.7 C) 98 F (36.7 C) (!) 97.5 F (36.4 C)  TempSrc: Oral Oral Oral Oral  SpO2:   97% 95%  Weight:      Height:       No intake or output data in the 24 hours ending 04/20/24 1427 Filed Weights   04/18/24 2250  Weight: 51 kg    Examination:  General exam: Appears calm and comfortable  Respiratory system: unlabored Cardiovascular system: RRR S/NT/ND No CVA TTP Central nervous system: Alert and oriented. Dysarthric. Extremities: no LEE   Data Reviewed: I have personally reviewed following labs and imaging  studies  CBC: Recent Labs  Lab 04/15/24 1118 04/18/24 1710 04/19/24 0752 04/20/24 0534 04/20/24 1348  WBC 15.6* 8.0 5.4 3.9*  --   NEUTROABS  --  5.8 2.9  --   --   HGB 15.1* 13.3 10.2* 8.7* 9.6*  HCT 45.2 39.4 30.3* 25.9* 28.5*  MCV 86.4 84.0 85.1 84.9  --   PLT 313 233 143* 130*  --     Basic Metabolic Panel: Recent Labs  Lab 04/15/24 1118 04/18/24 1710 04/19/24 0752 04/20/24 0534  NA 141 134* 139 139  K 4.2 3.6 2.9* 3.3*  CL 106 97* 107 108  CO2 14* 23 21* 23  GLUCOSE 165* 99 78 82  BUN 21 25* 11 <5*  CREATININE 1.59* 1.08* 0.69 0.66  CALCIUM  10.8* 11.3* 8.2* 8.1*  MG  --   --  1.5* 2.1  PHOS  --   --   --  2.4*    GFR: Estimated Creatinine Clearance: 47.2 mL/min (by C-G formula based on SCr of 0.66 mg/dL).  Liver Function Tests: Recent Labs  Lab 04/15/24 1118 04/18/24 1710 04/19/24 0752 04/20/24 0534  AST 23 29 25 22   ALT 14 15 12 13   ALKPHOS 49 52 27* 29*  BILITOT 1.5* 0.3 0.5 0.7  PROT 7.7 6.4* 4.1* 3.8*  ALBUMIN  4.4 3.8 2.3* 2.1*    CBG: No results for input(s): GLUCAP in the last 168 hours.   Recent Results (from the past 240 hours)  Blood Culture (routine x 2)     Status: None (Preliminary result)   Collection Time: 04/18/24  5:12 PM   Specimen: BLOOD  Result Value Ref Range Status   Specimen Description   Final    BLOOD LEFT ANTECUBITAL Performed at [facility], [address], [place], [place] [address]    Special Requests   Final    BOTTLES DRAWN AEROBIC AND ANAEROBIC Blood Culture adequate volume Performed at [facility], [address], [place], [place] [address]    Culture   Final    NO GROWTH 2 DAYS Performed at [facility], [address] [place], [place] [address]    Report Status PENDING  Incomplete  Resp panel by RT-PCR (RSV, Flu Antrice Pal&B, Covid) Anterior Nasal Swab     Status: None   Collection Time: 04/18/24  5:45 PM   Specimen: Anterior Nasal Swab  Result Value Ref  Range Status   SARS Coronavirus 2 by RT PCR NEGATIVE NEGATIVE Final    Comment: (NOTE) SARS-CoV-2 target nucleic acids are NOT DETECTED.  The SARS-CoV-2 RNA is generally detectable in upper respiratory specimens during the acute phase of infection. The lowest concentration of SARS-CoV-2 viral copies this assay can detect is 138 copies/mL. Dimetrius Montfort negative result does not preclude SARS-Cov-2 infection and should not be used as the sole basis for treatment or other patient management decisions. Tristan Proto negative result may occur with  improper specimen collection/handling, submission of specimen other than nasopharyngeal swab, presence of viral mutation(s) within the areas targeted by this assay, and inadequate number of viral copies(<138 copies/mL). Narely Nobles negative result must be combined with clinical observations, patient history, and epidemiological information. The expected result is Negative.  Fact Sheet for Patients:  bloggercourse.com  Fact Sheet for Healthcare Providers:  seriousbroker.it  This test is no t yet approved or cleared by the [place]  FDA and  has been authorized for detection and/or diagnosis of SARS-CoV-2 by FDA under an Emergency Use Authorization (EUA). This EUA will remain  in effect (meaning this test can be used) for the duration of the COVID-19 declaration under Section 564(b)(1) of the Act, 21 U.S.C.section 360bbb-3(b)(1), unless the authorization is terminated  or revoked sooner.       Influenza Toivo Bordon by PCR NEGATIVE NEGATIVE Final   Influenza B by PCR NEGATIVE NEGATIVE Final    Comment: (NOTE) The Xpert Xpress SARS-CoV-2/FLU/RSV plus assay is intended as an aid in the diagnosis of influenza from Nasopharyngeal swab specimens and should not be used as Deliana Avalos sole basis for treatment. Nasal washings and aspirates are unacceptable for Xpert Xpress SARS-CoV-2/FLU/RSV testing.  Fact Sheet for  Patients: bloggercourse.com  Fact Sheet for Healthcare Providers: seriousbroker.it  This test is not yet approved or cleared by the [place]  FDA and has been authorized for detection and/or diagnosis of SARS-CoV-2 by FDA under an Emergency Use Authorization (EUA). This EUA will remain in effect (meaning this test  can be used) for the duration of the COVID-19 declaration under Section 564(b)(1) of the Act, 21 U.S.C. section 360bbb-3(b)(1), unless the authorization is terminated or revoked.     Resp Syncytial Virus by PCR NEGATIVE NEGATIVE Final    Comment: (NOTE) Fact Sheet for Patients: bloggercourse.com  Fact Sheet for Healthcare Providers: seriousbroker.it  This test is not yet approved or cleared by the [place]  FDA and has been authorized for detection and/or diagnosis of SARS-CoV-2 by FDA under an Emergency Use Authorization (EUA). This EUA will remain in effect (meaning this test can be used) for the duration of the COVID-19 declaration under Section 564(b)(1) of the Act, 21 U.S.C. section 360bbb-3(b)(1), unless the authorization is terminated or revoked.  Performed at [facility], [address], [place], [place] [address]   Blood Culture (routine x 2)     Status: None (Preliminary result)   Collection Time: 04/18/24  5:45 PM   Specimen: BLOOD LEFT FOREARM  Result Value Ref Range Status   Specimen Description   Final    BLOOD LEFT FOREARM Performed at [facility], [address], [place], [place] [address]    Special Requests   Final    BOTTLES DRAWN AEROBIC AND ANAEROBIC Blood Culture adequate volume Performed at [facility], [address], [place], [place] [address]    Culture   Final    NO GROWTH 2 DAYS Performed at [facility], [address], [place], [place] [address]    Report Status PENDING   Incomplete  Urine Culture (for pregnant, neutropenic or urologic patients or patients with an indwelling urinary catheter)     Status: None   Collection Time: 04/18/24 11:52 PM   Specimen: Urine, Clean Catch  Result Value Ref Range Status   Specimen Description URINE, CLEAN CATCH  Final   Special Requests Normal  Final   Culture   Final    NO GROWTH Performed at [facility], [address], [place], [place] [address]    Report Status 04/20/2024 FINAL  Final         Radiology Studies: US  EKG SITE RITE Result Date: 04/19/2024 If Site Rite image not attached, placement could not be confirmed due to current cardiac rhythm.  DG Chest Port 1 View Result Date: 04/18/2024 EXAM: 1 VIEW(S) XRAY OF THE CHEST 04/18/2024 05:24:30 PM COMPARISON: 01/28/2022 CLINICAL HISTORY: Questionable sepsis - evaluate for abnormality. FINDINGS: LUNGS AND PLEURA: Shallow inspiration. Lungs are clear. No pleural effusion. No pneumothorax. HEART AND MEDIASTINUM: Heart size and pulmonary vascularity are normal. Mediastinal contours appear intact. Calcification of the aorta. BONES AND SOFT TISSUES: No acute osseous abnormality. Degenerative changes in the spine and shoulders. Postoperative changes seen in the lumbar spine. IMPRESSION: 1. No acute cardiopulmonary process identified. Electronically signed by: Elsie Gravely MD 04/18/2024 06:22 PM EST RP Workstation: [id number]        Scheduled Meds:  apixaban   5 mg Oral BID   Chlorhexidine  Gluconate Cloth  6 each Topical Daily   [START ON 04/21/2024] cosyntropin   0.25 mg Intravenous Once   cyclobenzaprine   10 mg Oral QHS   famotidine   20 mg Oral Daily   mirabegron  ER  50 mg Oral Daily   Naltrexone  1 mg capsule  1 mg Oral Daily   potassium chloride   40 mEq Oral Q4H   pregabalin   150 mg Oral QHS   pregabalin   75 mg Oral Daily   rosuvastatin   20 mg Oral Daily  sodium chloride  flush  10-40 mL Intracatheter Q12H   topiramate   50 mg Oral BID    Continuous Infusions:  piperacillin -tazobactam (ZOSYN )  IV 3.375 g (04/20/24 1131)   [START ON 04/21/2024] vancomycin        LOS: 2 days    Time spent: over 30 min     Meliton Monte, MD [facility]   To contact the attending provider between 7A-7P or the covering provider during after hours 7P-7A, please log into the web site [email or web address] and access using universal [place] password for that web site. If you do not have the password, please call the hospital operator.  04/20/2024, 2:27 PM

## 2024-04-21 DIAGNOSIS — I959 Hypotension, unspecified: Secondary | ICD-10-CM | POA: Diagnosis not present

## 2024-04-21 LAB — BASIC METABOLIC PANEL WITH GFR
Anion gap: 7 (ref 5–15)
BUN: <5 mg/dL — ABNORMAL LOW (ref 8–23)
CO2: 20 mmol/L — ABNORMAL LOW (ref 22–32)
Calcium: 8.3 mg/dL — ABNORMAL LOW (ref 8.9–10.3)
Chloride: 113 mmol/L — ABNORMAL HIGH (ref 98–111)
Creatinine, Ser: 0.73 mg/dL (ref 0.44–1.00)
GFR, Estimated: >60 mL/min (ref >60)
Glucose, Bld: 94 mg/dL (ref 70–99)
Potassium: 3.7 mmol/L (ref 3.5–5.1)
Sodium: 140 mmol/L (ref 135–145)

## 2024-04-21 LAB — CBC
HCT: 30.6 % — ABNORMAL LOW (ref 36.0–46.0)
Hemoglobin: 10 g/dL — ABNORMAL LOW (ref 12.0–15.0)
MCH: 28.7 pg (ref 26.0–34.0)
MCHC: 32.7 g/dL (ref 30.0–36.0)
MCV: 87.9 fL (ref 80.0–100.0)
Platelets: 142 K/uL — ABNORMAL LOW (ref 150–400)
RBC: 3.48 MIL/uL — ABNORMAL LOW (ref 3.87–5.11)
RDW: 13.6 % (ref 11.5–15.5)
WBC: 4.4 K/uL (ref 4.0–10.5)
nRBC: 0 % (ref 0.0–0.2)

## 2024-04-21 LAB — VITAMIN B12: Vitamin B-12: 1259 pg/mL — ABNORMAL HIGH (ref 180–914)

## 2024-04-21 LAB — CORTISOL: Cortisol, Plasma: 14.1 ug/dL

## 2024-04-21 LAB — FOLATE: Folate: 16.3 ng/mL (ref >5.9)

## 2024-04-21 LAB — FERRITIN: Ferritin: 183 ng/mL (ref 11–307)

## 2024-04-21 LAB — T4, FREE: Free T4: 1.15 ng/dL — ABNORMAL HIGH (ref 0.61–1.12)

## 2024-04-21 LAB — ACTH STIMULATION, 3 TIME POINTS
Cortisol, 30 Min: 14.7 ug/dL
Cortisol, 30 Min: 15.4 ug/dL
Cortisol, 60 Min: 14.8 ug/dL
Cortisol, 60 Min: UNDETERMINED ug/dL
Cortisol, Base: 1.4 ug/dL
Cortisol, Base: 1.8 ug/dL

## 2024-04-21 LAB — IRON AND TIBC
Iron: 71 ug/dL (ref 28–170)
Saturation Ratios: 42 % — ABNORMAL HIGH (ref 10.4–31.8)
TIBC: 168 ug/dL — ABNORMAL LOW (ref 250–450)
UIBC: 97 ug/dL

## 2024-04-21 LAB — TSH: TSH: 7.602 u[IU]/mL — ABNORMAL HIGH (ref 0.350–4.500)

## 2024-04-21 MED ORDER — AMOXICILLIN-POT CLAVULANATE 875-125 MG PO TABS: 1.0000 | ORAL_TABLET | Freq: Two times a day (BID) | ORAL | 0 refills | Status: AC

## 2024-04-21 MED ORDER — POTASSIUM CHLORIDE CRYS ER 20 MEQ PO TBCR
40.0000 meq | EXTENDED_RELEASE_TABLET | Freq: Once | ORAL | Status: AC
  Administered 2024-04-21: 40 meq via ORAL
  Filled 2024-04-21: qty 2

## 2024-04-21 NOTE — Discharge Summary (Addendum)
 Physician Discharge Summary  Chloe Johnson FMW:[id number] DOB: 04/15/1956 DOA: 04/18/2024  PCP: Dwight Trula SQUIBB, MD  Admit date: 04/18/2024 Discharge date: 04/21/2024  Time spent: 40 minutes  Recommendations for Outpatient Follow-up:  Follow outpatient CBC/CMP  Complete antibiotic course Concern for adrenal insufficiency - AM cortisol <3, stim indeterminate range - endocrine referral placed - note, she received steroid injection in August, possibly cause of suppressed AM cortisol.   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 At the time CT showed features concerning for pyelonephritis and her UA was abnormal. She was discharged home on oral antibiotics, but returned to the [facility] 11/24 reporting progressive weakness and fatigue. On arrival she was was hypotensive with systolic in the 80s. After Issabelle Mcraney fluid bolus her BP initially improved.   She's been treated for pyelonephritis.  Urine culture no growth, but collected after antibiotics.    She's improved at time of discharge.  See below for additional details.   Hospital Course:  Assessment and Plan:  Pyelonephritis  CT with findings concerning for pyelonephritis UA from 11/24 not particularly impressive (UA 11/21 c/w UTI wuith large LE, negative  nitrite, 11-20 RBC, > 50 WBC) Unfortunately, doubt we'll get good culture data as no culture collected 11/21 and had been on antibiotics when we collected cultures 11/24.   Blood cultures 11/24 NG Urine culture with no growth Will d/c vanc.  Narrow to augmentin .  Note many antibiotic intolerances, including penicillin (rash).  Was transitioned to zosyn  per pharmacy.  Has tolerated zosyn  here.  Will send home on augmentin , discussed with pharmacy.     Hypotension  Syncopal Episode Episode of LOC in setting of hypotension Sepsis ruled out - normal WBC count at presentation.  Afebrile.  Normal HR, normal RR.  Suspect hypotension related to infection + poor PO intake over 1 week + nausea/vomiting + antihypertensive administration (coreg , spironolactone , valstartan) BP improved today Has been seen by PCCM, they've signed off   Concern for Adrenal Insuffiency AM cortisol <3 (2.3, 1.4 this AM) ACTH  stim test (I think they mislabeled blood draws 30 min, 60 min - peak 15.4, indeterminate range) On further discussion, she received steroid injection in August in her shoulder, could explain low base cortisol - will need outpatient endocrine follow up Will refer to endocrinology outpatient  Abnormal Thyroid  Function Tests Needs repeat thyroid  function tests as an outpatient  Hx 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 - recovered On lifelong anticoag due 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 V5, V6).  Short PR. Echo without wall motion  abnormality, preserved EF, grade 1 diastolic dysfunction    Severe hypokalemia improving   Severe hypomagnesemia resolved   Normocytic anemia  Hb has trended down, but now appears stable Suspect Khyri Hinzman component of dilution Iron, folate, ferritin wnl.  B12 elevated.     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 Left ventricular ejection fraction, by estimation, is 60 to 65%. The  left ventricle has normal function. The left ventricle has no regional  wall motion abnormalities. Left ventricular diastolic parameters are  consistent with Grade I diastolic  dysfunction (impaired relaxation).   2. Right ventricular systolic function is normal. The right ventricular  size is normal. Tricuspid regurgitation signal is inadequate for assessing  PA pressure.   3. The mitral valve is normal in structure. Trivial mitral valve  regurgitation. No evidence of mitral stenosis.   4. The aortic valve is tricuspid. Aortic valve regurgitation is not  visualized. No aortic stenosis is present.   5. The inferior vena cava is normal in size with greater than 50%  respiratory variability, suggesting right atrial pressure of 3 mmHg.     Consultations: none  Discharge Exam: Vitals:   04/21/24 0759 04/21/24 0800  BP: 128/70 128/70  Pulse: 82 80  Resp: 18 18  Temp: 98.3 F (36.8 C)   SpO2: 98% 98%   Feeling better today Husband at bedside Feels ok to go home  General: No acute distress. Cardiovascular: RRR Lungs: unlabored Neurological: Alert and oriented 3. Moves all extremities 4 with equal strength. Cranial nerves II through XII grossly intact. Extremities: No clubbing or cyanosis. No edema.  Discharge Instructions   Discharge Instructions     Ambulatory referral to Endocrinology   Complete by: As  directed    Call MD for:  difficulty breathing, headache or visual disturbances   Complete by: As directed    Call MD for:  extreme fatigue   Complete by: As directed    Call MD for:  hives   Complete by: As directed    Call MD for:  persistant dizziness or light-headedness   Complete by: As directed    Call MD for:  persistant nausea and vomiting   Complete by: As directed    Call MD for:  redness, tenderness, or signs of infection (pain, swelling, redness, odor or green/yellow discharge around incision site)   Complete by: As directed    Call MD for:  severe uncontrolled pain   Complete by: As directed    Call MD for:  temperature >100.4   Complete by: As directed    Diet - low sodium heart healthy   Complete by: As directed    Discharge instructions   Complete by: As directed    You were seen for pyelonephritis (Kiesha Ensey kidney infection or urinary tract infection).  You've improved after antibiotics.  Your urine culture was negative (this was because you'd already received antibiotics by the time we collected this).  We'll complete Liliyana Thobe 7 day course of antibiotics, we'll discharge you home with augmentin  to finish the course.  I'll send your antibiotics to the [facility] [address] since it's Thanksgiving.  You had hypotension (low blood pressure) when you came into the hospital.  I think this was because of your blood pressure medicine and your poor ability to eat/drink over the last 7-10 days.  This has improved with IV fluids and antibiotic treatment of your UTI as well as changing you to an antibiotic that you have tolerated better.  Resume your spironolactone , but watch for lightheadedness or dizziness (if you have these symptoms, stop this medicine until you can follow up with your outpatient doctor).  Hold your coreg  and valsartan  for now.   You had Raeya Merritts blood test for your low blood pressure called Zandria Woldt cortisol.  This was low.  We followed this up with an ACTH  stimulation test and the results  are in an indeterminate range.  We'll send you to the endocrinologist as an outpatient.  You may end up needing chronic steroids.  You can also follow these results up with your PCP.    Your thyroid  function was mildly abnormal.  You should get repeat thyroid  function tests outpatient.    Your heart enzymes were elevated, but your ultrasound of your heart was reassuring without any wall motion abnormalities and normal pump/squeeze.   Return for new, recurrent, or worsening symptoms.  Please ask your PCP to request records from this hospitalization so they know what was done and what the next steps will be.   Increase activity slowly   Complete by: As directed       Allergies as of 04/21/2024       Reactions   Doxycycline  Nausea Only   malaise    Erythromycin Nausea And Vomiting   Other Diarrhea   Oral Steroids   Vancomycin     Tolerated vancomycin  therapy with slower infusion rates   Vicodin [hydrocodone-acetaminophen ] Other (See Comments)   Restless, tolerate percocet   Dilaudid  [hydromorphone  Hcl] Rash   *Pt states she can take if given benadryl  prior*   Keflex [cephalexin] Rash   Penicillins Rash   Hives Tolerated Zosyn    Sulfa Antibiotics Rash   Hives        Medication List     PAUSE taking these medications    carvedilol  3.125 MG tablet Wait to take this until your doctor or other care provider tells you to start again. Hold this until you follow with your outpatient doctor.   Commonly known as: COREG  Take 1 tablet (3.125 mg total) by mouth 2 (two) times daily.   valsartan  80 MG tablet Wait to take this until your doctor or other care provider tells you to start again. Hold this until you follow with your outpatient doctor Commonly known as: DIOVAN  Take 1 tablet (80 mg total) by mouth daily.   WEGOVY  [place] Wait to take this until your doctor or other care provider tells you to start again. Hold this until you're told to resume by your outpatient doctor   Inject into the skin.       TAKE these medications    acetaminophen  325 MG tablet Commonly known as: TYLENOL  Take 2 tablets (650 mg total) by mouth every 4 (four) hours as needed for mild pain (or temp > 37.5 C (99.5 F)).   amoxicillin -clavulanate 875-125 MG tablet Commonly known as: AUGMENTIN  Take 1 tablet by mouth every 12 (twelve) hours for 4 days.   B Complex-C-Folic Acid  Tabs Take 2 tablets by mouth daily.   cyclobenzaprine  10 MG tablet Commonly known as: FLEXERIL  TAKE ONE TABLET BY MOUTH AT BEDTIME   Eliquis  5 MG  Tabs tablet Generic drug: apixaban  Take 1 tablet (5 mg total) by mouth 2 (two) times daily.   famotidine  20 MG tablet Commonly known as: PEPCID  Take 1 tablet (20 mg total) by mouth 2 (two) times daily.   Ginkgo Biloba 120 MG Caps Take 2 capsules by mouth daily.   loratadine  10 MG tablet Commonly known as: CLARITIN  Take 1 tablet (10 mg total) by mouth daily.   Magnesium  300 MG Caps Take 300 mg by mouth in the morning and at bedtime. PT TAKES GUMMIES MAG BID   mirabegron  ER 50 MG Tb24 tablet Commonly known as: MYRBETRIQ  Take 50 mg by mouth daily.   NALTREXONE  HCL (PAIN) PO Take 1 mg by mouth daily at 6 (six) AM.   ondansetron  4 MG disintegrating tablet Commonly known as: ZOFRAN -ODT Take 1 tablet (4 mg total) by mouth every 8 (eight) hours as needed for nausea or vomiting.   potassium chloride  10 MEQ tablet Commonly known as: KLOR-CON  Take 1 tablet (10 mEq total) by mouth 2 (two) times daily.   pregabalin  100 MG capsule Commonly known as: LYRICA  Take 1 capsule (100 mg total) by mouth daily. What changed:  how much to take when to take this   pregabalin  75 MG capsule Commonly known as: Lyrica  Take 1 capsule (75 mg total) by mouth daily. What changed: Another medication with the same name was changed. Make sure you understand how and when to take each.   rosuvastatin  20 MG tablet Commonly known as: CRESTOR  Take 1 tablet (20 mg total) by  mouth daily.   spironolactone  25 MG tablet Commonly known as: ALDACTONE  Take 1/2 tablet (12.5 mg total) by mouth daily.   topiramate  25 MG tablet Commonly known as: TOPAMAX  Take 1 tablet (25 mg total) by mouth daily as needed. What changed:  when to take this Another medication with the same name was removed. Continue taking this medication, and follow the directions you see here.   UNABLE TO FIND Med Name: HAPPY HER SUPPLEMENT   SUPPORTS INFLAMMATION AND MOOD PER THE PT       Allergies  Allergen Reactions   Doxycycline  Nausea Only    malaise    Erythromycin Nausea And Vomiting   Other Diarrhea    Oral Steroids   Vancomycin      Tolerated vancomycin  therapy with slower infusion rates   Vicodin [Hydrocodone-Acetaminophen ] Other (See Comments)    Restless, tolerate percocet   Dilaudid  [Hydromorphone  Hcl] Rash    *Pt states she can take if given benadryl  prior*   Keflex [Cephalexin] Rash   Penicillins Rash    Hives Tolerated Zosyn    Sulfa Antibiotics Rash    Hives      The results of significant diagnostics from this hospitalization (including imaging, microbiology, ancillary and laboratory) are listed below for reference.    Significant Diagnostic Studies: ECHOCARDIOGRAM COMPLETE Result Date: 04/20/2024    ECHOCARDIOGRAM REPORT   Patient Name:   Chloe Johnson Date of Exam: 04/20/2024 Medical Rec #:  [id number]    Height:       59.0 in Accession #:    [id number]   Weight:       112.4 lb Date of Birth:  Oct 01, 1955    BSA:          1.444 m Patient Age:    68 years     BP:           113/62 mmHg Patient Gender: F  HR:           88 bpm. Exam Location:  Inpatient Procedure: 2D Echo, Cardiac Doppler and Color Doppler (Both Spectral and Color            Flow Doppler were utilized during procedure). Indications:    Elevated Troponin  History:        Patient has prior history of Echocardiogram examinations, most                 recent 03/07/2022. Cardiomyopathy, Previous  Myocardial                 Infarction, Stroke, Signs/Symptoms:Hypotension; Risk                 Factors:Hypertension and Dyslipidemia.  Sonographer:    Juliene Rucks Referring Phys: [id number] JEFFREY T MCCLUNG IMPRESSIONS  1. Left ventricular ejection fraction, by estimation, is 60 to 65%. The left ventricle has normal function. The left ventricle has no regional wall motion abnormalities. Left ventricular diastolic parameters are consistent with Grade I diastolic dysfunction (impaired relaxation).  2. Right ventricular systolic function is normal. The right ventricular size is normal. Tricuspid regurgitation signal is inadequate for assessing PA pressure.  3. The mitral valve is normal in structure. Trivial mitral valve regurgitation. No evidence of mitral stenosis.  4. The aortic valve is tricuspid. Aortic valve regurgitation is not visualized. No aortic stenosis is present.  5. The inferior vena cava is normal in size with greater than 50% respiratory variability, suggesting right atrial pressure of 3 mmHg. FINDINGS  Left Ventricle: Left ventricular ejection fraction, by estimation, is 60 to 65%. The left ventricle has normal function. The left ventricle has no regional wall motion abnormalities. The left ventricular internal cavity size was small. There is no left ventricular hypertrophy. Left ventricular diastolic parameters are consistent with Grade I diastolic dysfunction (impaired relaxation). Right Ventricle: The right ventricular size is normal. No increase in right ventricular wall thickness. Right ventricular systolic function is normal. Tricuspid regurgitation signal is inadequate for assessing PA pressure. Left Atrium: Left atrial size was normal in size. Right Atrium: Right atrial size was normal in size. Pericardium: There is no evidence of pericardial effusion. Mitral Valve: The mitral valve is normal in structure. Trivial mitral valve regurgitation. No evidence of mitral valve stenosis. Tricuspid Valve:  The tricuspid valve is normal in structure. Tricuspid valve regurgitation is trivial. Aortic Valve: The aortic valve is tricuspid. Aortic valve regurgitation is not visualized. No aortic stenosis is present. Aortic valve mean gradient measures 3.0 mmHg. Aortic valve peak gradient measures 6.0 mmHg. Aortic valve area, by VTI measures 2.82 cm. Pulmonic Valve: The pulmonic valve was not well visualized. Pulmonic valve regurgitation is trivial. Aorta: The aortic root and ascending aorta are structurally normal, with no evidence of dilitation. Venous: The inferior vena cava is normal in size with greater than 50% respiratory variability, suggesting right atrial pressure of 3 mmHg. IAS/Shunts: The interatrial septum was not well visualized.  LEFT VENTRICLE PLAX 2D LVIDd:         3.30 cm   Diastology LVIDs:         2.50 cm   LV e' medial:    6.09 cm/s LV PW:         1.10 cm   LV E/e' medial:  7.9 LV IVS:        0.90 cm   LV e' lateral:   7.40 cm/s LVOT diam:     2.00 cm  LV E/e' lateral: 6.5 LV SV:         63 LV SV Index:   43 LVOT Area:     3.14 cm LV IVRT:       102 msec  RIGHT VENTRICLE          IVC RV Basal diam:  2.60 cm  IVC diam: 1.90 cm RV Mid diam:    2.30 cm LEFT ATRIUM           Index        RIGHT ATRIUM           Index LA diam:      2.10 cm 1.45 cm/m   RA Area:     10.30 cm LA Vol (A4C): 32.3 ml 22.37 ml/m  RA Volume:   20.70 ml  14.34 ml/m  AORTIC VALVE AV Area (Vmax):    2.91 cm AV Area (Vmean):   2.88 cm AV Area (VTI):     2.82 cm AV Vmax:           122.00 cm/s AV Vmean:          82.500 cm/s AV VTI:            0.222 m AV Peak Grad:      6.0 mmHg AV Mean Grad:      3.0 mmHg LVOT Vmax:         113.00 cm/s LVOT Vmean:        75.500 cm/s LVOT VTI:          0.199 m LVOT/AV VTI ratio: 0.90  AORTA Ao Root diam: 3.20 cm Ao Asc diam:  3.10 cm MITRAL VALVE MV Area (PHT): 3.77 cm    SHUNTS MV Decel Time: 201 msec    Systemic VTI:  0.20 m MV E velocity: 48.30 cm/s  Systemic Diam: 2.00 cm MV Christianjames Soule velocity: 70.60  cm/s MV E/Janyth Riera ratio:  0.68 Lonni Nanas MD Electronically signed by Lonni Nanas MD Signature Date/Time: 04/20/2024/5:01:24 PM    Final    US  EKG SITE RITE Result Date: 04/19/2024 If Site Rite image not attached, placement could not be confirmed due to current cardiac rhythm.  DG Chest Port 1 View Result Date: 04/18/2024 EXAM: 1 VIEW(S) XRAY OF THE CHEST 04/18/2024 05:24:30 PM COMPARISON: 01/28/2022 CLINICAL HISTORY: Questionable sepsis - evaluate for abnormality. FINDINGS: LUNGS AND PLEURA: Shallow inspiration. Lungs are clear. No pleural effusion. No pneumothorax. HEART AND MEDIASTINUM: Heart size and pulmonary vascularity are normal. Mediastinal contours appear intact. Calcification of the aorta. BONES AND SOFT TISSUES: No acute osseous abnormality. Degenerative changes in the spine and shoulders. Postoperative changes seen in the lumbar spine. IMPRESSION: 1. No acute cardiopulmonary process identified. Electronically signed by: Elsie Gravely MD 04/18/2024 06:22 PM EST RP Workstation: [id number]   CT ABDOMEN PELVIS W CONTRAST Result Date: 04/15/2024 CLINICAL DATA:  Bowel obstruction EXAM: CT ABDOMEN AND PELVIS WITH CONTRAST TECHNIQUE: Multidetector CT imaging of the abdomen and pelvis was performed using the standard protocol following bolus administration of intravenous contrast. RADIATION DOSE REDUCTION: This exam was performed according to the departmental dose-optimization program which includes automated exposure control, adjustment of the mA and/or kV according to patient size and/or use of iterative reconstruction technique. CONTRAST:  75mL OMNIPAQUE  IOHEXOL  350 MG/ML SOLN COMPARISON:  01/28/2022, 08/14/2023 FINDINGS: Lower chest: No acute pleural or parenchymal lung disease. Hepatobiliary: No focal liver abnormality is seen. No gallstones, gallbladder wall thickening, or biliary dilatation. Pancreas: Unremarkable. No pancreatic ductal dilatation or surrounding inflammatory  changes.  Spleen: Normal in size without focal abnormality. Adrenals/Urinary Tract: There are subtle areas of decreased renal cortical enhancement, most pronounced within the left kidney on the delayed series, suspicious for pyelonephritis. No evidence of renal abscess. No urinary tract calculi or obstructive uropathy. The adrenals and bladder are unremarkable. Stomach/Bowel: No bowel obstruction or ileus. Normal appendix right lower quadrant. No bowel wall thickening or inflammatory change. Vascular/Lymphatic: Aortic atherosclerosis. No enlarged abdominal or pelvic lymph nodes. Reproductive: Stable appearance of uterus and adnexal structures. Other: No free fluid or free intraperitoneal gas. No abdominal wall hernia. Musculoskeletal: No acute or destructive bony abnormalities. Postsurgical changes from L2 through L5 again noted. Reconstructed images demonstrate no additional findings. IMPRESSION: 1. Subtle areas of decreased renal cortical enhancement, left greater than right, concerning for pyelonephritis. Please correlate with urinalysis. 2. No evidence of bowel obstruction or ileus.  Normal appendix. 3.  Aortic Atherosclerosis (ICD10-I70.0). Electronically Signed   By: Ozell Daring M.D.   On: 04/15/2024 15:08    Microbiology: Recent Results (from the past 240 hours)  Blood Culture (routine x 2)     Status: None (Preliminary result)   Collection Time: 04/18/24  5:12 PM   Specimen: BLOOD  Result Value Ref Range Status   Specimen Description   Final    BLOOD LEFT ANTECUBITAL Performed at [facility], [address] [place], [place] [address]    Special Requests   Final    BOTTLES DRAWN AEROBIC AND ANAEROBIC Blood Culture adequate volume Performed at [facility], [address] [place], [place] [address]    Culture   Final    NO GROWTH 3 DAYS Performed at [facility], [address], [place], [place] [address]    Report Status PENDING   Incomplete  Resp panel by RT-PCR (RSV, Flu Katianna Mcclenney&B, Covid) Anterior Nasal Swab     Status: None   Collection Time: 04/18/24  5:45 PM   Specimen: Anterior Nasal Swab  Result Value Ref Range Status   SARS Coronavirus 2 by RT PCR NEGATIVE NEGATIVE Final    Comment: (NOTE) SARS-CoV-2 target nucleic acids are NOT DETECTED.  The SARS-CoV-2 RNA is generally detectable in upper respiratory specimens during the acute phase of infection. The lowest concentration of SARS-CoV-2 viral copies this assay can detect is 138 copies/mL. Elester Apodaca negative result does not preclude SARS-Cov-2 infection and should not be used as the sole basis for treatment or other patient management decisions. Raffaele Derise negative result may occur with  improper specimen collection/handling, submission of specimen other than nasopharyngeal swab, presence of viral mutation(s) within the areas targeted by this assay, and inadequate number of viral copies(<138 copies/mL). Jael Waldorf negative result must be combined with clinical observations, patient history, and epidemiological information. The expected result is Negative.  Fact Sheet for Patients:  bloggercourse.com  Fact Sheet for Healthcare Providers:  seriousbroker.it  This test is no t yet approved or cleared by the [place]  FDA and  has been authorized for detection and/or diagnosis of SARS-CoV-2 by FDA under an Emergency Use Authorization (EUA). This EUA will remain  in effect (meaning this test can be used) for the duration of the COVID-19 declaration under Section 564(b)(1) of the Act, 21 U.S.C.section 360bbb-3(b)(1), unless the authorization is terminated  or revoked sooner.       Influenza Kirin Pastorino by PCR NEGATIVE NEGATIVE Final   Influenza B by PCR NEGATIVE NEGATIVE Final    Comment: (NOTE) The Xpert Xpress SARS-CoV-2/FLU/RSV plus assay is intended as an aid in  the diagnosis of influenza from Nasopharyngeal swab specimens and should  not be used as Viyan Rosamond sole basis for treatment. Nasal washings and aspirates are unacceptable for Xpert Xpress SARS-CoV-2/FLU/RSV testing.  Fact Sheet for Patients: bloggercourse.com  Fact Sheet for Healthcare Providers: seriousbroker.it  This test is not yet approved or cleared by the [place]  FDA and has been authorized for detection and/or diagnosis of SARS-CoV-2 by FDA under an Emergency Use Authorization (EUA). This EUA will remain in effect (meaning this test can be used) for the duration of the COVID-19 declaration under Section 564(b)(1) of the Act, 21 U.S.C. section 360bbb-3(b)(1), unless the authorization is terminated or revoked.     Resp Syncytial Virus by PCR NEGATIVE NEGATIVE Final    Comment: (NOTE) Fact Sheet for Patients: bloggercourse.com  Fact Sheet for Healthcare Providers: [employer]  This test is not yet approved or cleared by the [place]  FDA and has been authorized for detection and/or diagnosis of SARS-CoV-2 by FDA under an Emergency Use Authorization (EUA). This EUA will remain in effect (meaning this test can be used) for the duration of the COVID-19 declaration under Section 564(b)(1) of the Act, 21 U.S.C. section 360bbb-3(b)(1), unless the authorization is terminated or revoked.  Performed at [facility], [address] [place], [place] [address]   Blood Culture (routine x 2)     Status: None (Preliminary result)   Collection Time: 04/18/24  5:45 PM   Specimen: BLOOD LEFT FOREARM  Result Value Ref Range Status   Specimen Description   Final    BLOOD LEFT FOREARM Performed at [facility], [address] [place], [place] [address]    Special Requests   Final    BOTTLES DRAWN AEROBIC AND ANAEROBIC Blood Culture adequate volume Performed at [facility], [address] [place],  [place] [address]    Culture   Final    NO GROWTH 3 DAYS Performed at [facility], [address] [place], [place] [address]    Report Status PENDING  Incomplete  Urine Culture (for pregnant, neutropenic or urologic patients or patients with an indwelling urinary catheter)     Status: None   Collection Time: 04/18/24 11:52 PM   Specimen: Urine, Clean Catch  Result Value Ref Range Status   Specimen Description URINE, CLEAN CATCH  Final   Special Requests Normal  Final   Culture   Final    NO GROWTH Performed at [facility], [address] [place], [place] [address]    Report Status 04/20/2024 FINAL  Final     Labs: Basic Metabolic Panel: Recent Labs  Lab 04/15/24 1118 04/18/24 1710 04/19/24 0752 04/20/24 0534 04/21/24 0614  NA 141 134* 139 139 140  K 4.2 3.6 2.9* 3.3* 3.7  CL 106 97* 107 108 113*  CO2 14* 23 21* 23 20*  GLUCOSE 165* 99 78 82 94  BUN 21 25* 11 <5* <5*  CREATININE 1.59* 1.08* 0.69 0.66 0.73  CALCIUM  10.8* 11.3* 8.2* 8.1* 8.3*  MG  --   --  1.5* 2.1  --   PHOS  --   --   --  2.4*  --    Liver Function Tests: Recent Labs  Lab 04/15/24 1118 04/18/24 1710 04/19/24 0752 04/20/24 0534  AST 23 29 25 22   ALT 14 15 12 13   ALKPHOS 49 52 27* 29*  BILITOT 1.5* 0.3 0.5 0.7  PROT 7.7 6.4* 4.1* 3.8*  ALBUMIN  4.4 3.8 2.3* 2.1*  Recent Labs  Lab 04/15/24 1118  LIPASE 71*   No results for input(s): AMMONIA in the last 168 hours. CBC: Recent Labs  Lab 04/15/24 1118 04/18/24 1710 04/19/24 0752 04/20/24 0534 04/20/24 1348 04/21/24 0614  WBC 15.6* 8.0 5.4 3.9*  --  4.4  NEUTROABS  --  5.8 2.9  --   --   --   HGB 15.1* 13.3 10.2* 8.7* 9.6* 10.0*  HCT 45.2 39.4 30.3* 25.9* 28.5* 30.6*  MCV 86.4 84.0 85.1 84.9  --  87.9  PLT 313 233 143* 130*  --  142*   Cardiac Enzymes: No results for input(s): CKTOTAL, CKMB, CKMBINDEX, TROPONINI in the last 168 hours. BNP: BNP (last 3 results) No results for input(s): BNP in the last 8760  hours.  ProBNP (last 3 results) No results for input(s): PROBNP in the last 8760 hours.  CBG: No results for input(s): GLUCAP in the last 168 hours.     Signed:  Meliton Monte MD.  [facility] 04/21/2024, 10:48 AM

## 2024-04-21 NOTE — Plan of Care (Signed)
  Problem: Education: Goal: Knowledge of General Education information will improve Description: Including pain rating scale, medication(s)/side effects and non-pharmacologic comfort measures Outcome: Progressing   Problem: Health Behavior/Discharge Planning: Goal: Ability to manage health-related needs will improve Outcome: Progressing   Problem: Clinical Measurements: Goal: Ability to maintain clinical measurements within normal limits will improve Outcome: Progressing Goal: Diagnostic test results will improve Outcome: Progressing Goal: Respiratory complications will improve Outcome: Progressing Goal: Cardiovascular complication will be avoided Outcome: Progressing   Problem: Activity: Goal: Risk for activity intolerance will decrease Outcome: Progressing   Problem: Nutrition: Goal: Adequate nutrition will be maintained Outcome: Progressing   Problem: Coping: Goal: Level of anxiety will decrease Outcome: Progressing   Problem: Elimination: Goal: Will not experience complications related to bowel motility Outcome: Progressing Goal: Will not experience complications related to urinary retention Outcome: Progressing   Problem: Pain Managment: Goal: General experience of comfort will improve and/or be controlled Outcome: Progressing   Problem: Safety: Goal: Ability to remain free from injury will improve Outcome: Progressing   Problem: Skin Integrity: Goal: Risk for impaired skin integrity will decrease Outcome: Progressing

## 2024-04-21 NOTE — TOC Transition Note (Signed)
 Transition of Care (TOC) - Discharge Note Rayfield Gobble RN, BSN Inpatient Care Management Unit 4E- RN Case Manager See Treatment Team for direct phone # [phone] Holiday coverage   Patient Details  Name: Chloe Johnson MRN: [id number] Date of Birth: 1956-05-09  Transition of Care [facility]) CM/SW Contact:  Gobble Rayfield Hurst, RN Phone Number: 04/21/2024, 11:51 AM   Clinical Narrative:    Pt stable for transition home today, No HH or DME needs noted. Family to transport home.   IP CM will sign off no further needs noted.    Final next level of care: Home/Self Care Barriers to Discharge: Barriers Resolved   Patient Goals and CMS Choice Patient states their goals for this hospitalization and ongoing recovery are:: Plans to return home once stable.   Choice offered to / list presented to : NA      Discharge Placement               Home        Discharge Plan and Services Additional resources added to the After Visit Summary for     Discharge Planning Services: CM Consult Post Acute Care Choice: NA            DME Agency: NA       HH Arranged: NA HH Agency: NA        Social Drivers of Health (SDOH) Interventions SDOH Screenings   Food Insecurity: No Food Insecurity (04/18/2024)  Housing: Low Risk  (04/18/2024)  Transportation Needs: No Transportation Needs (04/18/2024)  Utilities: Not At Risk (04/18/2024)  Depression (PHQ2-9): Low Risk  (04/08/2024)  Social Connections: Socially Integrated (04/18/2024)  Tobacco Use: Low Risk  (04/19/2024)     Readmission Risk Interventions    04/21/2024   11:51 AM  Readmission Risk Prevention Plan  Transportation Screening Complete  Home Care Screening Complete  Medication Review (RN CM) Complete

## 2024-04-23 LAB — CULTURE, BLOOD (ROUTINE X 2)
Culture: NO GROWTH
Culture: NO GROWTH
Special Requests: ADEQUATE
Special Requests: ADEQUATE

## 2024-05-05 ENCOUNTER — Other Ambulatory Visit: Payer: Self-pay | Admitting: Internal Medicine

## 2024-05-05 DIAGNOSIS — E2749 Other adrenocortical insufficiency: Secondary | ICD-10-CM

## 2024-05-10 ENCOUNTER — Other Ambulatory Visit: Payer: Self-pay

## 2024-05-10 MED ORDER — APIXABAN 5 MG PO TABS: 5.0000 mg | ORAL_TABLET | Freq: Two times a day (BID) | ORAL | 5 refills | Status: AC

## 2024-05-10 NOTE — Telephone Encounter (Signed)
 Prescription refill request for Eliquis  received. Indication:cva Last office visit:10/25 Scr: 0.73  11/25 Age:68 Weight:51  kg  Prescription refilled

## 2024-05-29 ENCOUNTER — Ambulatory Visit
Admission: RE | Admit: 2024-05-29 | Discharge: 2024-05-29 | Disposition: A | Source: Ambulatory Visit | Attending: Internal Medicine | Admitting: Internal Medicine

## 2024-05-29 DIAGNOSIS — E2749 Other adrenocortical insufficiency: Secondary | ICD-10-CM

## 2024-05-29 MED ORDER — GADOPICLENOL 0.5 MMOL/ML IV SOLN
5.0000 mL | Freq: Once | INTRAVENOUS | Status: AC | PRN
  Administered 2024-05-29: 5 mL via INTRAVENOUS

## 2024-06-07 ENCOUNTER — Other Ambulatory Visit: Payer: Self-pay | Admitting: Physical Medicine and Rehabilitation

## 2024-06-10 ENCOUNTER — Other Ambulatory Visit: Payer: Self-pay | Admitting: Physical Medicine and Rehabilitation

## 2024-06-10 MED ORDER — TOPIRAMATE 50 MG PO TABS: 50.0000 mg | ORAL_TABLET | Freq: Two times a day (BID) | ORAL | 3 refills | Status: AC

## 2024-06-10 MED ORDER — PREGABALIN 75 MG PO CAPS: 75.0000 mg | ORAL_CAPSULE | Freq: Three times a day (TID) | ORAL | 3 refills | Status: AC

## 2024-06-14 ENCOUNTER — Other Ambulatory Visit: Payer: Self-pay | Admitting: Cardiology

## 2024-06-14 DIAGNOSIS — I5181 Takotsubo syndrome: Secondary | ICD-10-CM

## 2024-06-14 DIAGNOSIS — I5020 Unspecified systolic (congestive) heart failure: Secondary | ICD-10-CM

## 2024-06-14 DIAGNOSIS — R0600 Dyspnea, unspecified: Secondary | ICD-10-CM

## 2024-06-14 DIAGNOSIS — Z79899 Other long term (current) drug therapy: Secondary | ICD-10-CM

## 2024-06-17 ENCOUNTER — Telehealth (HOSPITAL_BASED_OUTPATIENT_CLINIC_OR_DEPARTMENT_OTHER): Payer: Self-pay

## 2024-06-17 NOTE — Telephone Encounter (Signed)
 Please advise holding Eliquis  prior to C6-C7 ESI. Last labs 03/2024.   Thank you!  AW

## 2024-06-17 NOTE — Telephone Encounter (Signed)
"  ° °  Pre-operative Risk Assessment    Patient Name: Chloe Johnson  DOB: 1956/04/19 MRN: [id number]   Date of last office visit: 03/24/24 with Dr. Lonni Date of next office visit: NA  Request for Surgical Clearance    Procedure:  C6-C7 ESI  Date of Surgery:  Clearance TBD                                 Surgeon:  Dr. Darlis Socks Group or Practice Name:  [facility]  Phone number:  [phone] Fax number:  [phone]   Type of Clearance Requested:   - Medical  - Pharmacy:  Hold Apixaban  (Eliquis ) for 3 days prior    Type of Anesthesia:  not indicated   Additional requests/questions:    [name]   06/17/2024, 9:21 AM   "

## 2024-06-20 NOTE — Telephone Encounter (Signed)
 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    Procedure: C6-C7 ESI  Date of procedure: TBD  CrCl 60 ml/min Platelet count 142k   **This guidance is not considered finalized until pre-operative APP has relayed final recommendations.**

## 2024-06-21 ENCOUNTER — Encounter (HOSPITAL_BASED_OUTPATIENT_CLINIC_OR_DEPARTMENT_OTHER): Payer: Self-pay | Admitting: Cardiology

## 2024-06-21 DIAGNOSIS — R0600 Dyspnea, unspecified: Secondary | ICD-10-CM

## 2024-06-21 DIAGNOSIS — I5020 Unspecified systolic (congestive) heart failure: Secondary | ICD-10-CM

## 2024-06-21 DIAGNOSIS — I5181 Takotsubo syndrome: Secondary | ICD-10-CM

## 2024-06-21 DIAGNOSIS — Z79899 Other long term (current) drug therapy: Secondary | ICD-10-CM

## 2024-06-21 MED ORDER — SPIRONOLACTONE 25 MG PO TABS: 12.5000 mg | ORAL_TABLET | Freq: Every day | ORAL | 2 refills | Status: DC

## 2024-06-21 NOTE — Telephone Encounter (Signed)
 Labs completed on 03/10/24

## 2024-06-28 MED ORDER — SPIRONOLACTONE 25 MG PO TABS: 25.0000 mg | ORAL_TABLET | Freq: Every day | ORAL | 1 refills | Status: AC

## 2024-06-30 NOTE — Telephone Encounter (Signed)
 Office called back in. Pt is supposed to have this done today 2/5 at 3pm. Please advise.

## 2024-06-30 NOTE — Telephone Encounter (Signed)
 Called patient, son answered. Stated that patient is already there getting the injection done. Please advise on next step.

## 2024-06-30 NOTE — Telephone Encounter (Signed)
Office is calling for update. Please advise 

## 2024-06-30 NOTE — Telephone Encounter (Signed)
" ° °  Name: Chloe Johnson  DOB: 09/02/1955  MRN: [id number]  Primary Cardiologist: Shelda Bruckner, MD  Chart reviewed as part of pre-operative protocol coverage. Because of Lakaisha Danish Meech's past medical history and time since last visit, she will require a follow-up in-office visit in order to better assess preoperative cardiovascular risk.  Pre-op covering staff: - Please schedule appointment and call patient to inform them. If patient already had an upcoming appointment within acceptable timeframe, please add pre-op clearance to the appointment notes so provider is aware. - Please contact requesting surgeon's office via preferred method (i.e, phone, fax) to inform them of need for appointment prior to surgery.  Dr Jessy comments: There really is very little risk for holding her Eliquis  2 or 3 days before a procedure.  She has the prothrombin 2 gene mutation.  This is one of the lesser risk mutations that we see.  As such, I really do not see a problem with her being off Eliquis  for 2 or 3 days before any procedure.   Orren LOISE Fabry, PA-C  06/30/2024, 2:01 PM   "

## 2024-07-11 ENCOUNTER — Encounter: Admitting: Physical Medicine and Rehabilitation
